# Patient Record
Sex: Male | Born: 1955 | Race: White | Hispanic: No | Marital: Single | Smoking: Former smoker
Health system: Southern US, Community
[De-identification: ages and names within clinical notes are randomized; demographics above are authoritative.]

## PROBLEM LIST (undated history)

## (undated) DIAGNOSIS — J189 Pneumonia, unspecified organism: Secondary | ICD-10-CM

## (undated) DIAGNOSIS — Z720 Tobacco use: Secondary | ICD-10-CM

## (undated) DIAGNOSIS — Z9981 Dependence on supplemental oxygen: Secondary | ICD-10-CM

## (undated) DIAGNOSIS — Q2730 Arteriovenous malformation, site unspecified: Secondary | ICD-10-CM

## (undated) DIAGNOSIS — K469 Unspecified abdominal hernia without obstruction or gangrene: Secondary | ICD-10-CM

## (undated) DIAGNOSIS — I5032 Chronic diastolic (congestive) heart failure: Secondary | ICD-10-CM

## (undated) DIAGNOSIS — R911 Solitary pulmonary nodule: Secondary | ICD-10-CM

## (undated) DIAGNOSIS — Z973 Presence of spectacles and contact lenses: Secondary | ICD-10-CM

## (undated) DIAGNOSIS — Z9289 Personal history of other medical treatment: Secondary | ICD-10-CM

## (undated) DIAGNOSIS — D126 Benign neoplasm of colon, unspecified: Secondary | ICD-10-CM

## (undated) DIAGNOSIS — I639 Cerebral infarction, unspecified: Secondary | ICD-10-CM

## (undated) DIAGNOSIS — I219 Acute myocardial infarction, unspecified: Secondary | ICD-10-CM

## (undated) DIAGNOSIS — E785 Hyperlipidemia, unspecified: Secondary | ICD-10-CM

## (undated) DIAGNOSIS — IMO0002 Reserved for concepts with insufficient information to code with codable children: Secondary | ICD-10-CM

## (undated) DIAGNOSIS — F419 Anxiety disorder, unspecified: Secondary | ICD-10-CM

## (undated) DIAGNOSIS — I739 Peripheral vascular disease, unspecified: Secondary | ICD-10-CM

## (undated) DIAGNOSIS — D649 Anemia, unspecified: Secondary | ICD-10-CM

## (undated) DIAGNOSIS — M545 Other chronic pain: Secondary | ICD-10-CM

## (undated) DIAGNOSIS — L299 Pruritus, unspecified: Secondary | ICD-10-CM

## (undated) DIAGNOSIS — E669 Obesity, unspecified: Secondary | ICD-10-CM

## (undated) DIAGNOSIS — I480 Paroxysmal atrial fibrillation: Secondary | ICD-10-CM

## (undated) DIAGNOSIS — I779 Disorder of arteries and arterioles, unspecified: Secondary | ICD-10-CM

## (undated) DIAGNOSIS — K219 Gastro-esophageal reflux disease without esophagitis: Secondary | ICD-10-CM

## (undated) DIAGNOSIS — I10 Essential (primary) hypertension: Secondary | ICD-10-CM

## (undated) DIAGNOSIS — F79 Unspecified intellectual disabilities: Secondary | ICD-10-CM

## (undated) DIAGNOSIS — J449 Chronic obstructive pulmonary disease, unspecified: Secondary | ICD-10-CM

## (undated) DIAGNOSIS — G8929 Other chronic pain: Secondary | ICD-10-CM

## (undated) HISTORY — DX: Obesity, unspecified: E66.9

## (undated) HISTORY — DX: Reserved for concepts with insufficient information to code with codable children: IMO0002

## (undated) HISTORY — DX: Peripheral vascular disease, unspecified: I73.9

## (undated) HISTORY — DX: Benign neoplasm of colon, unspecified: D12.6

## (undated) HISTORY — DX: Disorder of arteries and arterioles, unspecified: I77.9

## (undated) HISTORY — PX: MULTIPLE TOOTH EXTRACTIONS: SHX2053

## (undated) HISTORY — DX: Unspecified abdominal hernia without obstruction or gangrene: K46.9

## (undated) HISTORY — DX: Solitary pulmonary nodule: R91.1

## (undated) HISTORY — DX: Personal history of other medical treatment: Z92.89

## (undated) HISTORY — PX: CATARACT EXTRACTION, BILATERAL: SHX1313

## (undated) HISTORY — DX: Cerebral infarction, unspecified: I63.9

## (undated) HISTORY — DX: Arteriovenous malformation, site unspecified: Q27.30

## (undated) HISTORY — DX: Unspecified intellectual disabilities: F79

## (undated) HISTORY — DX: Anemia, unspecified: D64.9

## (undated) HISTORY — DX: Other chronic pain: G89.29

## (undated) HISTORY — DX: Paroxysmal atrial fibrillation: I48.0

## (undated) HISTORY — DX: Anxiety disorder, unspecified: F41.9

## (undated) HISTORY — DX: Chronic obstructive pulmonary disease, unspecified: J44.9

## (undated) HISTORY — DX: Low back pain: M54.5

## (undated) HISTORY — DX: Tobacco use: Z72.0

## (undated) HISTORY — DX: Other chronic pain: M54.50

## (undated) HISTORY — DX: Hyperlipidemia, unspecified: E78.5

## (undated) HISTORY — DX: Acute myocardial infarction, unspecified: I21.9

## (undated) HISTORY — PX: OTHER SURGICAL HISTORY: SHX169

## (undated) HISTORY — DX: Chronic diastolic (congestive) heart failure: I50.32

## (undated) SURGERY — IMAGING PROCEDURE, GI TRACT, INTRALUMINAL, VIA CAPSULE
Anesthesia: LOCAL

---

## 2001-08-17 ENCOUNTER — Emergency Department (HOSPITAL_COMMUNITY): Admission: EM | Admit: 2001-08-17 | Discharge: 2001-08-17 | Payer: Self-pay | Admitting: Emergency Medicine

## 2001-08-17 ENCOUNTER — Encounter: Payer: Self-pay | Admitting: Emergency Medicine

## 2002-09-30 ENCOUNTER — Encounter: Admission: RE | Admit: 2002-09-30 | Discharge: 2002-09-30 | Payer: Self-pay | Admitting: Family Medicine

## 2002-09-30 ENCOUNTER — Ambulatory Visit (HOSPITAL_COMMUNITY): Admission: RE | Admit: 2002-09-30 | Discharge: 2002-09-30 | Payer: Self-pay | Admitting: Family Medicine

## 2003-10-02 ENCOUNTER — Emergency Department (HOSPITAL_COMMUNITY): Admission: EM | Admit: 2003-10-02 | Discharge: 2003-10-03 | Payer: Self-pay | Admitting: Emergency Medicine

## 2003-10-09 ENCOUNTER — Inpatient Hospital Stay (HOSPITAL_COMMUNITY): Admission: EM | Admit: 2003-10-09 | Discharge: 2003-10-20 | Payer: Self-pay | Admitting: Emergency Medicine

## 2003-10-10 ENCOUNTER — Encounter (INDEPENDENT_AMBULATORY_CARE_PROVIDER_SITE_OTHER): Payer: Self-pay | Admitting: Cardiology

## 2003-10-22 ENCOUNTER — Emergency Department (HOSPITAL_COMMUNITY): Admission: EM | Admit: 2003-10-22 | Discharge: 2003-10-22 | Payer: Self-pay | Admitting: Emergency Medicine

## 2003-11-06 ENCOUNTER — Encounter: Admission: RE | Admit: 2003-11-06 | Discharge: 2004-02-04 | Payer: Self-pay | Admitting: Internal Medicine

## 2003-11-07 ENCOUNTER — Encounter: Admission: RE | Admit: 2003-11-07 | Discharge: 2003-11-07 | Payer: Self-pay | Admitting: Family Medicine

## 2003-12-08 ENCOUNTER — Encounter: Admission: RE | Admit: 2003-12-08 | Discharge: 2003-12-08 | Payer: Self-pay | Admitting: Family Medicine

## 2004-01-05 ENCOUNTER — Encounter: Admission: RE | Admit: 2004-01-05 | Discharge: 2004-01-05 | Payer: Self-pay | Admitting: Family Medicine

## 2006-10-05 DIAGNOSIS — D509 Iron deficiency anemia, unspecified: Secondary | ICD-10-CM | POA: Insufficient documentation

## 2006-10-05 DIAGNOSIS — J449 Chronic obstructive pulmonary disease, unspecified: Secondary | ICD-10-CM

## 2006-10-05 DIAGNOSIS — F79 Unspecified intellectual disabilities: Secondary | ICD-10-CM | POA: Insufficient documentation

## 2006-10-05 DIAGNOSIS — F17200 Nicotine dependence, unspecified, uncomplicated: Secondary | ICD-10-CM

## 2006-10-05 DIAGNOSIS — E7800 Pure hypercholesterolemia, unspecified: Secondary | ICD-10-CM

## 2006-12-13 ENCOUNTER — Telehealth (INDEPENDENT_AMBULATORY_CARE_PROVIDER_SITE_OTHER): Payer: Self-pay | Admitting: *Deleted

## 2006-12-22 ENCOUNTER — Encounter: Payer: Self-pay | Admitting: Family Medicine

## 2006-12-22 ENCOUNTER — Ambulatory Visit: Payer: Self-pay | Admitting: Family Medicine

## 2006-12-22 DIAGNOSIS — E1165 Type 2 diabetes mellitus with hyperglycemia: Secondary | ICD-10-CM

## 2006-12-22 DIAGNOSIS — I10 Essential (primary) hypertension: Secondary | ICD-10-CM

## 2006-12-22 LAB — CONVERTED CEMR LAB
ALT: 14 units/L (ref 0–53)
Alkaline Phosphatase: 84 units/L (ref 39–117)
CO2: 24 meq/L (ref 19–32)
Direct LDL: 118 mg/dL — ABNORMAL HIGH
MCV: 76.9 fL
Platelets: 269 10*3/uL
Potassium: 4.7 meq/L (ref 3.5–5.3)
Sodium: 135 meq/L (ref 135–145)
Total Bilirubin: 0.3 mg/dL (ref 0.3–1.2)
Total Protein: 6.9 g/dL (ref 6.0–8.3)

## 2007-01-17 ENCOUNTER — Ambulatory Visit: Payer: Self-pay | Admitting: Sports Medicine

## 2007-01-23 ENCOUNTER — Telehealth: Payer: Self-pay | Admitting: Family Medicine

## 2007-02-21 ENCOUNTER — Encounter (INDEPENDENT_AMBULATORY_CARE_PROVIDER_SITE_OTHER): Payer: Self-pay | Admitting: *Deleted

## 2007-03-14 ENCOUNTER — Telehealth: Payer: Self-pay | Admitting: *Deleted

## 2007-06-22 ENCOUNTER — Ambulatory Visit: Payer: Self-pay | Admitting: Family Medicine

## 2007-06-22 ENCOUNTER — Telehealth (INDEPENDENT_AMBULATORY_CARE_PROVIDER_SITE_OTHER): Payer: Self-pay | Admitting: *Deleted

## 2007-06-22 ENCOUNTER — Encounter: Payer: Self-pay | Admitting: Family Medicine

## 2007-06-22 DIAGNOSIS — M545 Low back pain: Secondary | ICD-10-CM

## 2007-06-22 DIAGNOSIS — F411 Generalized anxiety disorder: Secondary | ICD-10-CM | POA: Insufficient documentation

## 2007-06-22 LAB — CONVERTED CEMR LAB
BUN: 10 mg/dL (ref 6–23)
Chloride: 97 meq/L (ref 96–112)
Glucose, Bld: 354 mg/dL — ABNORMAL HIGH (ref 70–99)
LDL Cholesterol: 143 mg/dL — ABNORMAL HIGH (ref 0–99)
Potassium: 4.5 meq/L (ref 3.5–5.3)
VLDL: 47 mg/dL — ABNORMAL HIGH (ref 0–40)

## 2007-06-26 ENCOUNTER — Encounter: Payer: Self-pay | Admitting: Family Medicine

## 2007-06-29 ENCOUNTER — Encounter: Payer: Self-pay | Admitting: *Deleted

## 2007-07-01 ENCOUNTER — Emergency Department (HOSPITAL_COMMUNITY): Admission: EM | Admit: 2007-07-01 | Discharge: 2007-07-01 | Payer: Self-pay | Admitting: Family Medicine

## 2007-07-02 ENCOUNTER — Encounter: Payer: Self-pay | Admitting: Family Medicine

## 2007-07-04 ENCOUNTER — Emergency Department (HOSPITAL_COMMUNITY): Admission: EM | Admit: 2007-07-04 | Discharge: 2007-07-04 | Payer: Self-pay | Admitting: Emergency Medicine

## 2007-07-04 ENCOUNTER — Telehealth (INDEPENDENT_AMBULATORY_CARE_PROVIDER_SITE_OTHER): Payer: Self-pay | Admitting: *Deleted

## 2007-07-07 ENCOUNTER — Telehealth (INDEPENDENT_AMBULATORY_CARE_PROVIDER_SITE_OTHER): Payer: Self-pay | Admitting: Family Medicine

## 2007-07-13 ENCOUNTER — Telehealth: Payer: Self-pay | Admitting: Family Medicine

## 2007-07-30 ENCOUNTER — Ambulatory Visit: Payer: Self-pay | Admitting: Sports Medicine

## 2007-08-15 ENCOUNTER — Encounter: Payer: Self-pay | Admitting: Family Medicine

## 2007-08-16 ENCOUNTER — Ambulatory Visit: Payer: Self-pay | Admitting: Family Medicine

## 2007-08-16 ENCOUNTER — Observation Stay (HOSPITAL_COMMUNITY): Admission: EM | Admit: 2007-08-16 | Discharge: 2007-08-16 | Payer: Self-pay | Admitting: Emergency Medicine

## 2007-08-31 ENCOUNTER — Encounter: Payer: Self-pay | Admitting: *Deleted

## 2007-09-04 ENCOUNTER — Telehealth: Payer: Self-pay | Admitting: Family Medicine

## 2008-01-18 ENCOUNTER — Telehealth: Payer: Self-pay | Admitting: *Deleted

## 2008-01-18 ENCOUNTER — Ambulatory Visit: Payer: Self-pay | Admitting: Family Medicine

## 2008-01-18 LAB — CONVERTED CEMR LAB: Blood Glucose, Fingerstick: >400

## 2008-03-07 ENCOUNTER — Ambulatory Visit: Payer: Self-pay | Admitting: Family Medicine

## 2008-03-07 LAB — CONVERTED CEMR LAB: Hgb A1c MFr Bld: 10.5 %

## 2008-04-07 ENCOUNTER — Ambulatory Visit: Payer: Self-pay | Admitting: Family Medicine

## 2008-04-07 ENCOUNTER — Encounter: Payer: Self-pay | Admitting: Family Medicine

## 2008-04-15 LAB — CONVERTED CEMR LAB
AST: 19 units/L (ref 0–37)
Alkaline Phosphatase: 81 units/L (ref 39–117)
BUN: 14 mg/dL (ref 6–23)
Creatinine, Ser: 0.81 mg/dL (ref 0.40–1.50)

## 2009-04-10 ENCOUNTER — Encounter: Payer: Self-pay | Admitting: Family Medicine

## 2009-04-10 ENCOUNTER — Ambulatory Visit: Payer: Self-pay | Admitting: Family Medicine

## 2009-04-10 LAB — CONVERTED CEMR LAB
Albumin: 4 g/dL (ref 3.5–5.2)
Alkaline Phosphatase: 87 units/L (ref 39–117)
CO2: 22 meq/L (ref 19–32)
Glucose, Bld: 413 mg/dL — ABNORMAL HIGH (ref 70–99)
Potassium: 4.3 meq/L (ref 3.5–5.3)
Sodium: 136 meq/L (ref 135–145)
Total Protein: 7.4 g/dL (ref 6.0–8.3)

## 2009-04-17 ENCOUNTER — Ambulatory Visit: Payer: Self-pay | Admitting: Family Medicine

## 2009-04-17 ENCOUNTER — Encounter: Payer: Self-pay | Admitting: Family Medicine

## 2009-04-17 LAB — CONVERTED CEMR LAB
Cholesterol: 162 mg/dL (ref 0–200)
LDL Cholesterol: 100 mg/dL — ABNORMAL HIGH (ref 0–99)
Triglycerides: 180 mg/dL — ABNORMAL HIGH (ref <150)

## 2009-04-27 ENCOUNTER — Ambulatory Visit: Payer: Self-pay | Admitting: Family Medicine

## 2009-06-08 DIAGNOSIS — I480 Paroxysmal atrial fibrillation: Secondary | ICD-10-CM

## 2009-06-08 HISTORY — DX: Paroxysmal atrial fibrillation: I48.0

## 2009-06-23 ENCOUNTER — Encounter: Payer: Self-pay | Admitting: Family Medicine

## 2009-06-29 ENCOUNTER — Encounter: Payer: Self-pay | Admitting: Family Medicine

## 2009-06-29 ENCOUNTER — Inpatient Hospital Stay (HOSPITAL_COMMUNITY): Admission: EM | Admit: 2009-06-29 | Discharge: 2009-07-01 | Payer: Self-pay | Admitting: Emergency Medicine

## 2009-06-29 ENCOUNTER — Ambulatory Visit: Payer: Self-pay | Admitting: Family Medicine

## 2009-06-30 ENCOUNTER — Encounter: Payer: Self-pay | Admitting: Family Medicine

## 2009-07-09 ENCOUNTER — Encounter: Payer: Self-pay | Admitting: Family Medicine

## 2009-07-13 ENCOUNTER — Encounter: Payer: Self-pay | Admitting: Family Medicine

## 2009-07-23 ENCOUNTER — Ambulatory Visit: Payer: Self-pay | Admitting: Vascular Surgery

## 2009-07-23 ENCOUNTER — Encounter: Payer: Self-pay | Admitting: Family Medicine

## 2009-07-23 ENCOUNTER — Emergency Department (HOSPITAL_COMMUNITY): Admission: EM | Admit: 2009-07-23 | Discharge: 2009-07-23 | Payer: Self-pay | Admitting: Emergency Medicine

## 2009-07-23 ENCOUNTER — Encounter (INDEPENDENT_AMBULATORY_CARE_PROVIDER_SITE_OTHER): Payer: Self-pay | Admitting: Emergency Medicine

## 2009-07-24 ENCOUNTER — Encounter: Payer: Self-pay | Admitting: Family Medicine

## 2009-07-24 ENCOUNTER — Encounter: Payer: Self-pay | Admitting: *Deleted

## 2009-07-24 ENCOUNTER — Ambulatory Visit: Payer: Self-pay | Admitting: Family Medicine

## 2009-07-24 DIAGNOSIS — I480 Paroxysmal atrial fibrillation: Secondary | ICD-10-CM | POA: Insufficient documentation

## 2009-07-24 LAB — CONVERTED CEMR LAB
Albumin: 4 g/dL (ref 3.5–5.2)
CO2: 28 meq/L (ref 19–32)
Calcium: 9.6 mg/dL (ref 8.4–10.5)
Chloride: 99 meq/L (ref 96–112)
Glucose, Bld: 191 mg/dL — ABNORMAL HIGH (ref 70–99)
MCV: 80.5 fL (ref 78.0–100.0)
PSA: 0.18 ng/mL (ref 0.10–4.00)
Potassium: 4 meq/L (ref 3.5–5.3)
RBC: 4.93 M/uL (ref 4.22–5.81)
Sodium: 138 meq/L (ref 135–145)
Total Bilirubin: 0.3 mg/dL (ref 0.3–1.2)
Total Protein: 7.4 g/dL (ref 6.0–8.3)
WBC: 8.4 10*3/uL (ref 4.0–10.5)

## 2009-07-27 ENCOUNTER — Telehealth: Payer: Self-pay | Admitting: Family Medicine

## 2009-08-13 ENCOUNTER — Telehealth: Payer: Self-pay | Admitting: Family Medicine

## 2009-08-27 ENCOUNTER — Ambulatory Visit: Payer: Self-pay | Admitting: Family Medicine

## 2009-08-27 ENCOUNTER — Ambulatory Visit (HOSPITAL_COMMUNITY): Admission: RE | Admit: 2009-08-27 | Discharge: 2009-08-27 | Payer: Self-pay | Admitting: Family Medicine

## 2009-10-13 ENCOUNTER — Encounter: Payer: Self-pay | Admitting: Family Medicine

## 2009-10-14 ENCOUNTER — Encounter: Payer: Self-pay | Admitting: *Deleted

## 2009-10-22 ENCOUNTER — Encounter: Payer: Self-pay | Admitting: Family Medicine

## 2009-11-05 ENCOUNTER — Telehealth: Payer: Self-pay | Admitting: Family Medicine

## 2010-02-18 ENCOUNTER — Inpatient Hospital Stay: Payer: Self-pay | Admitting: Internal Medicine

## 2010-06-09 ENCOUNTER — Ambulatory Visit: Payer: Self-pay | Admitting: Family Medicine

## 2010-06-09 LAB — CONVERTED CEMR LAB: Hgb A1c MFr Bld: >14 %

## 2010-06-14 ENCOUNTER — Ambulatory Visit: Payer: Self-pay | Admitting: Family Medicine

## 2010-06-14 ENCOUNTER — Encounter: Payer: Self-pay | Admitting: Family Medicine

## 2010-06-14 LAB — CONVERTED CEMR LAB
ALT: 19 units/L (ref 0–53)
AST: 20 units/L (ref 0–37)
Alkaline Phosphatase: 75 units/L (ref 39–117)
Creatinine, Ser: 0.69 mg/dL (ref 0.40–1.50)
HCT: 43.4 % (ref 39.0–52.0)
Hemoglobin: 14.5 g/dL (ref 13.0–17.0)
LDL Cholesterol: 127 mg/dL — ABNORMAL HIGH (ref 0–99)
MCV: 78.2 fL (ref 78.0–100.0)
Platelets: 271 10*3/uL (ref 150–400)
RDW: 14.4 % (ref 11.5–15.5)
Total Bilirubin: 0.3 mg/dL (ref 0.3–1.2)

## 2010-06-18 ENCOUNTER — Encounter: Payer: Self-pay | Admitting: Family Medicine

## 2010-07-12 ENCOUNTER — Ambulatory Visit: Payer: Self-pay | Admitting: Family Medicine

## 2010-08-13 ENCOUNTER — Encounter: Payer: Self-pay | Admitting: *Deleted

## 2010-08-29 ENCOUNTER — Encounter: Payer: Self-pay | Admitting: Family Medicine

## 2010-09-01 ENCOUNTER — Ambulatory Visit: Admit: 2010-09-01 | Payer: Self-pay

## 2010-09-08 ENCOUNTER — Encounter: Payer: Self-pay | Admitting: Family Medicine

## 2010-09-08 ENCOUNTER — Emergency Department (HOSPITAL_COMMUNITY): Payer: No Typology Code available for payment source

## 2010-09-08 ENCOUNTER — Inpatient Hospital Stay (HOSPITAL_COMMUNITY)
Admission: EM | Admit: 2010-09-08 | Discharge: 2010-09-23 | DRG: 871 | Disposition: A | Discharge status: Skilled Nursing Facility | Payer: No Typology Code available for payment source | Attending: Family Medicine | Admitting: Family Medicine

## 2010-09-08 ENCOUNTER — Encounter (HOSPITAL_COMMUNITY): Payer: Self-pay | Admitting: Radiology

## 2010-09-08 DIAGNOSIS — E871 Hypo-osmolality and hyponatremia: Secondary | ICD-10-CM | POA: Diagnosis not present

## 2010-09-08 DIAGNOSIS — F341 Dysthymic disorder: Secondary | ICD-10-CM | POA: Diagnosis present

## 2010-09-08 DIAGNOSIS — G929 Unspecified toxic encephalopathy: Secondary | ICD-10-CM | POA: Diagnosis present

## 2010-09-08 DIAGNOSIS — J4489 Other specified chronic obstructive pulmonary disease: Secondary | ICD-10-CM | POA: Diagnosis present

## 2010-09-08 DIAGNOSIS — Z880 Allergy status to penicillin: Secondary | ICD-10-CM

## 2010-09-08 DIAGNOSIS — F1410 Cocaine abuse, uncomplicated: Secondary | ICD-10-CM | POA: Diagnosis present

## 2010-09-08 DIAGNOSIS — G009 Bacterial meningitis, unspecified: Secondary | ICD-10-CM | POA: Diagnosis present

## 2010-09-08 DIAGNOSIS — I10 Essential (primary) hypertension: Secondary | ICD-10-CM

## 2010-09-08 DIAGNOSIS — I6340 Cerebral infarction due to embolism of unspecified cerebral artery: Secondary | ICD-10-CM | POA: Diagnosis not present

## 2010-09-08 DIAGNOSIS — J9600 Acute respiratory failure, unspecified whether with hypoxia or hypercapnia: Secondary | ICD-10-CM | POA: Diagnosis present

## 2010-09-08 DIAGNOSIS — G92 Toxic encephalopathy: Secondary | ICD-10-CM | POA: Diagnosis present

## 2010-09-08 DIAGNOSIS — J449 Chronic obstructive pulmonary disease, unspecified: Secondary | ICD-10-CM | POA: Diagnosis present

## 2010-09-08 DIAGNOSIS — E876 Hypokalemia: Secondary | ICD-10-CM | POA: Diagnosis not present

## 2010-09-08 DIAGNOSIS — G039 Meningitis, unspecified: Secondary | ICD-10-CM

## 2010-09-08 DIAGNOSIS — Z7982 Long term (current) use of aspirin: Secondary | ICD-10-CM

## 2010-09-08 DIAGNOSIS — Z79899 Other long term (current) drug therapy: Secondary | ICD-10-CM

## 2010-09-08 DIAGNOSIS — Z6838 Body mass index (BMI) 38.0-38.9, adult: Secondary | ICD-10-CM

## 2010-09-08 DIAGNOSIS — F79 Unspecified intellectual disabilities: Secondary | ICD-10-CM | POA: Diagnosis present

## 2010-09-08 DIAGNOSIS — I4891 Unspecified atrial fibrillation: Secondary | ICD-10-CM | POA: Diagnosis present

## 2010-09-08 DIAGNOSIS — K439 Ventral hernia without obstruction or gangrene: Secondary | ICD-10-CM | POA: Diagnosis present

## 2010-09-08 DIAGNOSIS — E785 Hyperlipidemia, unspecified: Secondary | ICD-10-CM | POA: Diagnosis present

## 2010-09-08 DIAGNOSIS — D649 Anemia, unspecified: Secondary | ICD-10-CM

## 2010-09-08 DIAGNOSIS — L98499 Non-pressure chronic ulcer of skin of other sites with unspecified severity: Secondary | ICD-10-CM | POA: Diagnosis present

## 2010-09-08 DIAGNOSIS — Z794 Long term (current) use of insulin: Secondary | ICD-10-CM

## 2010-09-08 DIAGNOSIS — J320 Chronic maxillary sinusitis: Secondary | ICD-10-CM | POA: Diagnosis present

## 2010-09-08 DIAGNOSIS — E119 Type 2 diabetes mellitus without complications: Secondary | ICD-10-CM

## 2010-09-08 DIAGNOSIS — E669 Obesity, unspecified: Secondary | ICD-10-CM | POA: Diagnosis present

## 2010-09-08 DIAGNOSIS — I639 Cerebral infarction, unspecified: Secondary | ICD-10-CM

## 2010-09-08 DIAGNOSIS — D638 Anemia in other chronic diseases classified elsewhere: Secondary | ICD-10-CM | POA: Diagnosis present

## 2010-09-08 DIAGNOSIS — E875 Hyperkalemia: Secondary | ICD-10-CM | POA: Diagnosis not present

## 2010-09-08 DIAGNOSIS — R6521 Severe sepsis with septic shock: Secondary | ICD-10-CM | POA: Diagnosis present

## 2010-09-08 DIAGNOSIS — F17200 Nicotine dependence, unspecified, uncomplicated: Secondary | ICD-10-CM | POA: Diagnosis present

## 2010-09-08 DIAGNOSIS — I2510 Atherosclerotic heart disease of native coronary artery without angina pectoris: Secondary | ICD-10-CM | POA: Diagnosis present

## 2010-09-08 DIAGNOSIS — A419 Sepsis, unspecified organism: Principal | ICD-10-CM | POA: Diagnosis present

## 2010-09-08 DIAGNOSIS — E46 Unspecified protein-calorie malnutrition: Secondary | ICD-10-CM | POA: Diagnosis present

## 2010-09-08 DIAGNOSIS — R6520 Severe sepsis without septic shock: Secondary | ICD-10-CM | POA: Diagnosis present

## 2010-09-08 DIAGNOSIS — I4892 Unspecified atrial flutter: Secondary | ICD-10-CM | POA: Diagnosis present

## 2010-09-08 HISTORY — PX: TRANSTHORACIC ECHOCARDIOGRAM: SHX275

## 2010-09-08 HISTORY — DX: Cerebral infarction, unspecified: I63.9

## 2010-09-08 HISTORY — DX: Essential (primary) hypertension: I10

## 2010-09-08 HISTORY — PX: TRANSESOPHAGEAL ECHOCARDIOGRAM: SHX273

## 2010-09-08 LAB — CBC
MCH: 25.3 pg — ABNORMAL LOW (ref 26.0–34.0)
MCV: 75.7 fL — ABNORMAL LOW (ref 78.0–100.0)
Platelets: 330 10*3/uL (ref 150–400)
RBC: 5.38 MIL/uL (ref 4.22–5.81)
RDW: 13.7 % (ref 11.5–15.5)

## 2010-09-08 LAB — POCT I-STAT 3, ART BLOOD GAS (G3+)
Bicarbonate: 24.8 mEq/L — ABNORMAL HIGH (ref 20.0–24.0)
TCO2: 26 mmol/L (ref 0–100)
pCO2 arterial: 35 mmHg (ref 35.0–45.0)
pH, Arterial: 7.458 — ABNORMAL HIGH (ref 7.350–7.450)

## 2010-09-08 LAB — DIFFERENTIAL
Basophils Absolute: 0 10*3/uL (ref 0.0–0.1)
Eosinophils Relative: 0 % (ref 0–5)
Lymphocytes Relative: 7 % — ABNORMAL LOW (ref 12–46)
Lymphs Abs: 1.2 10*3/uL (ref 0.7–4.0)
Neutro Abs: 16.1 10*3/uL — ABNORMAL HIGH (ref 1.7–7.7)

## 2010-09-08 LAB — COMPREHENSIVE METABOLIC PANEL
ALT: 24 U/L (ref 0–53)
BUN: 11 mg/dL (ref 6–23)
Calcium: 9.8 mg/dL (ref 8.4–10.5)
Creatinine, Ser: 0.94 mg/dL (ref 0.4–1.5)
Glucose, Bld: 396 mg/dL — ABNORMAL HIGH (ref 70–99)
Sodium: 133 mEq/L — ABNORMAL LOW (ref 135–145)
Total Protein: 7.8 g/dL (ref 6.0–8.3)

## 2010-09-08 LAB — URINALYSIS, ROUTINE W REFLEX MICROSCOPIC
Bilirubin Urine: NEGATIVE
Ketones, ur: 40 mg/dL — AB
Nitrite: NEGATIVE
pH: 6 (ref 5.0–8.0)

## 2010-09-08 LAB — POCT I-STAT, CHEM 8
Chloride: 99 mEq/L (ref 96–112)
Creatinine, Ser: 0.7 mg/dL (ref 0.4–1.5)
Glucose, Bld: 422 mg/dL — ABNORMAL HIGH (ref 70–99)
HCT: 45 % (ref 39.0–52.0)
Hemoglobin: 15.3 g/dL (ref 13.0–17.0)
Potassium: 4.6 mEq/L (ref 3.5–5.1)
Sodium: 131 mEq/L — ABNORMAL LOW (ref 135–145)

## 2010-09-08 LAB — LACTIC ACID, PLASMA: Lactic Acid, Venous: 4.9 mmol/L — ABNORMAL HIGH (ref 0.5–2.2)

## 2010-09-08 LAB — CK TOTAL AND CKMB (NOT AT ARMC): Total CK: 478 U/L — ABNORMAL HIGH (ref 7–232)

## 2010-09-08 LAB — ETHANOL: Alcohol, Ethyl (B): <5 mg/dL (ref 0–10)

## 2010-09-08 LAB — PROCALCITONIN: Procalcitonin: <0.1 ng/mL

## 2010-09-08 LAB — RAPID URINE DRUG SCREEN, HOSP PERFORMED
Benzodiazepines: POSITIVE — AB
Cocaine: NOT DETECTED
Tetrahydrocannabinol: NOT DETECTED

## 2010-09-08 LAB — APTT: aPTT: 21 seconds — ABNORMAL LOW (ref 24–37)

## 2010-09-08 LAB — URINE MICROSCOPIC-ADD ON

## 2010-09-08 LAB — SALICYLATE LEVEL: Salicylate Lvl: <4 mg/dL (ref 2.8–20.0)

## 2010-09-08 LAB — LIPASE, BLOOD: Lipase: 15 U/L (ref 11–59)

## 2010-09-08 MED ORDER — IOHEXOL 300 MG/ML  SOLN: 100.0000 mL | Freq: Once | INTRAMUSCULAR | Status: AC | PRN

## 2010-09-09 ENCOUNTER — Inpatient Hospital Stay (HOSPITAL_COMMUNITY): Payer: No Typology Code available for payment source

## 2010-09-09 DIAGNOSIS — J9600 Acute respiratory failure, unspecified whether with hypoxia or hypercapnia: Secondary | ICD-10-CM

## 2010-09-09 DIAGNOSIS — I4891 Unspecified atrial fibrillation: Secondary | ICD-10-CM

## 2010-09-09 DIAGNOSIS — G40309 Generalized idiopathic epilepsy and epileptic syndromes, not intractable, without status epilepticus: Secondary | ICD-10-CM

## 2010-09-09 DIAGNOSIS — A419 Sepsis, unspecified organism: Secondary | ICD-10-CM

## 2010-09-09 LAB — GLUCOSE, CAPILLARY
Glucose-Capillary: 109 mg/dL — ABNORMAL HIGH (ref 70–99)
Glucose-Capillary: 128 mg/dL — ABNORMAL HIGH (ref 70–99)
Glucose-Capillary: 139 mg/dL — ABNORMAL HIGH (ref 70–99)
Glucose-Capillary: 147 mg/dL — ABNORMAL HIGH (ref 70–99)
Glucose-Capillary: 149 mg/dL — ABNORMAL HIGH (ref 70–99)
Glucose-Capillary: 179 mg/dL — ABNORMAL HIGH (ref 70–99)
Glucose-Capillary: 207 mg/dL — ABNORMAL HIGH (ref 70–99)
Glucose-Capillary: 255 mg/dL — ABNORMAL HIGH (ref 70–99)
Glucose-Capillary: 297 mg/dL — ABNORMAL HIGH (ref 70–99)
Glucose-Capillary: 315 mg/dL — ABNORMAL HIGH (ref 70–99)
Glucose-Capillary: 336 mg/dL — ABNORMAL HIGH (ref 70–99)
Glucose-Capillary: 347 mg/dL — ABNORMAL HIGH (ref 70–99)
Glucose-Capillary: 385 mg/dL — ABNORMAL HIGH (ref 70–99)
Glucose-Capillary: 88 mg/dL (ref 70–99)

## 2010-09-09 LAB — CBC
Hemoglobin: 11.5 g/dL — ABNORMAL LOW (ref 13.0–17.0)
MCHC: 32.5 g/dL (ref 30.0–36.0)
Platelets: 300 10*3/uL (ref 150–400)
RBC: 4.64 MIL/uL (ref 4.22–5.81)

## 2010-09-09 LAB — CARDIAC PANEL(CRET KIN+CKTOT+MB+TROPI)
CK, MB: 6.9 ng/mL (ref 0.3–4.0)
CK, MB: 4.3 ng/mL — ABNORMAL HIGH (ref 0.3–4.0)
Total CK: 1160 U/L — ABNORMAL HIGH (ref 7–232)
Total CK: 1289 U/L — ABNORMAL HIGH (ref 7–232)
Troponin I: 0.15 ng/mL — ABNORMAL HIGH (ref 0.00–0.06)
Troponin I: 0.22 ng/mL — ABNORMAL HIGH (ref 0.00–0.06)

## 2010-09-09 LAB — BASIC METABOLIC PANEL
CO2: 22 mEq/L (ref 19–32)
Calcium: 8.5 mg/dL (ref 8.4–10.5)
Chloride: 96 mEq/L (ref 96–112)
GFR calc Af Amer: >60 mL/min (ref >60)
Sodium: 130 mEq/L — ABNORMAL LOW (ref 135–145)

## 2010-09-09 LAB — POCT I-STAT 3, ART BLOOD GAS (G3+)
Bicarbonate: 23.8 mEq/L (ref 20.0–24.0)
TCO2: 25 mmol/L (ref 0–100)
pCO2 arterial: 35.2 mmHg (ref 35.0–45.0)
pH, Arterial: 7.446 (ref 7.350–7.450)

## 2010-09-09 LAB — CSF CELL COUNT WITH DIFFERENTIAL
Eosinophils, CSF: 0 % (ref 0–1)
Eosinophils, CSF: 0 % (ref 0–1)
Other Cells, CSF: 0
Segmented Neutrophils-CSF: 95 % — ABNORMAL HIGH (ref 0–6)
Segmented Neutrophils-CSF: 96 % — ABNORMAL HIGH (ref 0–6)
WBC, CSF: 117 /mm3 (ref 0–5)
WBC, CSF: 122 /mm3 (ref 0–5)

## 2010-09-09 LAB — OSMOLALITY: Osmolality: 283 mOsm/kg (ref 275–300)

## 2010-09-09 LAB — URINE CULTURE

## 2010-09-09 LAB — PROTEIN AND GLUCOSE, CSF: Total  Protein, CSF: 94 mg/dL — ABNORMAL HIGH (ref 15–45)

## 2010-09-09 MED ORDER — IOHEXOL 300 MG/ML  SOLN
100.0000 mL | Freq: Once | INTRAMUSCULAR | Status: AC | PRN
  Administered 2010-09-09: 100 mL via INTRAVENOUS
  Filled 2010-09-09: qty 100

## 2010-09-09 NOTE — Miscellaneous (Signed)
Summary: Repeat Chest CT to f/u RUL nodule  Clinical Lists Changes  Problems: Added new problem of LUNG NODULE (ICD-518.89) Orders: Added new Test order of CT with Contrast (CT w/ contrast) - Signed

## 2010-09-09 NOTE — Letter (Signed)
Summary: Generic Letter  [facility]  [address]   [place], [place] [address]   Phone: [phone]  Fax: [phone]    10/14/2009  [facility] [address] [place], [place]  [address]  Dear Mr. Pollino,  I have scheduled your CT scan for October 22, 2009 at 9:40am. You will need to arrive 20 minutes early and please DO NOT eat anything 4 hours before the procedure, you may drink water and take your medications. It will be done at [address]. If for any reason you cannot keep this appt you will need to call [facility] at [phone]) 24 hours in advance to reschedule. If you have any questions please feel free to call our office at [phone].  Also if you would call our office and give [place] an updated phone number so we will be able to reach you by phone in ther future.    Sincerely,   Tessie Fass CMA

## 2010-09-09 NOTE — Assessment & Plan Note (Signed)
Summary: f/u DM   Vital Signs:  Patient profile:   55 year old male Height:      60.5 inches Weight:      213 pounds BMI:     41.06 Temp:     98.2 degrees F oral Pulse rate:   78 / minute BP sitting:   111 / 75  (left arm) Cuff size:   regular  Vitals Entered By: Tessie Fass CMA (July 12, 2010 10:25 AM) CC: DM Is Patient Diabetic? Yes Pain Assessment Patient in pain? yes     Location: lower back Intensity: 9   Primary Care Provider:  Cat Ta MD  CC:  DM.  History of Present Illness: 55 y/o M here to discuss DM and Diltiazem+Simvastatin combo  DIABETES Meds: Lantus 15 units in AM.  Metformin 1000mg  two times a day, Glipizide 5mg  two times a day.  Compliance: compliance has been an issue, he does not inform us when he is out of medicine.  Often runs out of money for medicine.  It has been difficult to adjust his Lantus dose since historically he has gone for months without taking his meds.  States that has been taking all meds daily.   Diet: does not comply with ADA diet.  Drinks sodas (Cokes).  Lightheadedness:no    Dizziness :no   Confusion:no    Shakiness: no   Abd  pain: no   Nausea:no     Vomiting:no      CBGs:  130s average, but 197 this morning, a couple times in 200s.  Checks every morning.  No episodes of hypoglycemia.   Afib new onset 06/2009:  Taking Diltiazem and metoprolol for rate/rhythm control. No coumadin, CHADS score 2 (DM, HTN) No new events since onset No chest pain, palpitations, syncope, dyspnea   Habits & Providers  Alcohol-Tobacco-Diet     Tobacco Status: current     Tobacco Counseling: to quit use of tobacco products     Cigarette Packs/Day: 1.0  Current Medications (verified): 1)  Onetouch Test   Strp (Glucose Blood) .... Test Blood Glucose As Directed 2)  Lisinopril 10 Mg  Tabs (Lisinopril) .Marland Kitchen.. 1 Tab By Mouth Daily For Blood Pressure 3)  Metformin Hcl 1000 Mg Tabs (Metformin Hcl) .... One Tablet Twice Daily Prior To Meals For  Diabetes 4)  Paroxetine Hcl 40 Mg  Tabs (Paroxetine Hcl) .Marland Kitchen.. 1 Tab By Mouth Daily For Nerves 5)  Onetouch Lancets   Misc (Lancets) .... Test Blood Sugar As Directed 6)  Glipizide 5 Mg  Tabs (Glipizide) .Marland Kitchen.. 1 Tab By Mouth Two Times A Day For Diabetes 7)  Klonopin 0.5 Mg  Tabs (Clonazepam) .Marland Kitchen.. 1 Tab By Mouth Two Times A Day For Anxiety 8)  Simvastatin 80 Mg Tabs (Simvastatin) .Marland Kitchen.. 1 Tab By Mouth At Bedtime For Cholesterol 9)  Aspirin Ec Low Dose 81 Mg  Tbec (Aspirin) .Marland Kitchen.. 1 Tab By Mouth Daily 10)  Neurontin 300 Mg Caps (Gabapentin) .... One Tablet By Mouth Daily For Pain 11)  Lantus Solostar 100 Unit/ml Soln (Insulin Glargine) .... Inject 15 Units Every Morning. Dispense One Month Supply. 12)  Bd Insulin Syringe Microfine 28g X 1/2" 1 Ml Misc (Insulin Syringe-Needle U-100) .... Dispense Quantity Sufficient For One Month Supply With Daily Dosing.  Dispense One Box. 13)  Triamcinolone Acetonide 0.1 % Oint (Triamcinolone Acetonide) .... Apply To Both Legs Three Times A Day For Rash and Itchiness. 14)  Diltiazem Hcl Er Beads 240 Mg Xr24h-Cap (Diltiazem Hcl Er Beads) .Marland KitchenMarland KitchenMarland Kitchen  1 Capsule By Mouth Daily For Heart Rate 15)  Metoprolol Tartrate 25 Mg Tabs (Metoprolol Tartrate) .... 1/2 Tab By Mouth Two Times A Day For Blood Pressure 16)  Bd Pen Needle Mini U/f 31g X 5 Mm Misc (Insulin Pen Needle) .... Use As Directed 17)  Nitrostat 0.4 Mg Subl (Nitroglycerin) .... Place 1 On Tongue When You Have Chest Pain. If After The 2nd One You Still Have Chest Pain, Go To Er  Allergies (verified): 1)  ! Penicillin  Past History:  Past Medical History: -Mental retardation -tobacco dependence -h/o Anemia, iron def, -Diabetes mellitus, type II -Hypertension -Anxiety -Hyperlipidemia -A fib (06/2009), [place] score 57  -Hx of lung nodule -Chronic low back pain  **History of noncompliance  Social History: Packs/Day:  1.0  Review of Systems       per hpi   Physical Exam  General:   Well-developed,well-nourished,in no acute distress; alert,appropriate and cooperative throughout examination. Vitals reviewed.   Lungs:  Normal respiratory effort, chest expands symmetrically. Lungs are clear to auscultation, no crackles or wheezes. Heart:  Normal rate and regular rhythm. S1 and S2 normal without gallop, murmur, click, rub or other extra sounds. Extremities:  No clubbing, cyanosis, edema, or deformity noted with normal full range of motion of all joints.     Impression & Recommendations:  Problem # 1:  ATRIAL FIBRILLATION (ICD-427.31) Assessment Unchanged Rate controlled.  Will continue Diltiazem and metoprolol.  Will change Simvastatin 80mg  to Lipitor 40mg  to decrease risk of myalgias.    His updated medication list for this problem includes:    Aspirin Ec Low Dose 81 Mg Tbec (Aspirin) .Marland Kitchen... 1 tab by mouth daily    Diltiazem Hcl Er Beads 240 Mg Xr24h-cap (Diltiazem hcl er beads) .Marland Kitchen... 1 capsule by mouth daily for heart rate    Metoprolol Tartrate 25 Mg Tabs (Metoprolol tartrate) .Marland Kitchen... 1/2 tab by mouth two times a day for blood pressure  Orders: [facility]- Est Level  3 ([address])  Problem # 2:  DIABETES MELLITUS, TYPE II (ICD-250.00) Assessment: Unchanged A1C > 14 on 06/09/10 after being off meds for several months.  Close monitoring of CBGs to adjust Lantus.  No change to meds today.   His updated medication list for this problem includes:    Lisinopril 10 Mg Tabs (Lisinopril) .Marland Kitchen... 1 tab by mouth daily for blood pressure    Metformin Hcl 1000 Mg Tabs (Metformin hcl) ..... One tablet twice daily prior to meals for diabetes    Glipizide 5 Mg Tabs (Glipizide) .Marland Kitchen... 1 tab by mouth two times a day for diabetes    Aspirin Ec Low Dose 81 Mg Tbec (Aspirin) .Marland Kitchen... 1 tab by mouth daily    Lantus Solostar 100 Unit/ml Soln (Insulin glargine) ..... Inject 15 units every morning. dispense one month supply.  Orders: [facility]- Est Level  3 ([address])  Complete Medication List: 1)  Onetouch Test  Strp (Glucose blood) .... Test blood glucose as directed 2)  Lisinopril 10 Mg Tabs (Lisinopril) .Marland Kitchen.. 1 tab by mouth daily for blood pressure 3)  Metformin Hcl 1000 Mg Tabs (Metformin hcl) .... One tablet twice daily prior to meals for diabetes 4)  Paroxetine Hcl 40 Mg Tabs (Paroxetine hcl) .Marland Kitchen.. 1 tab by mouth daily for nerves 5)  Onetouch Lancets Misc (Lancets) .... Test blood sugar as directed 6)  Glipizide 5 Mg Tabs (Glipizide) .Marland Kitchen.. 1 tab by mouth two times a day for diabetes 7)  Klonopin 0.5 Mg Tabs (Clonazepam) .Marland Kitchen.. 1 tab by mouth  two times a day for anxiety 8)  Lipitor 40 Mg Tabs (Atorvastatin calcium) .Marland Kitchen.. 1 tab by mouth at bedtime for cholesterol.  do not take simvastatin while taking this. 9)  Aspirin Ec Low Dose 81 Mg Tbec (Aspirin) .Marland Kitchen.. 1 tab by mouth daily 10)  Neurontin 300 Mg Caps (Gabapentin) .... One tablet by mouth daily for pain 11)  Lantus Solostar 100 Unit/ml Soln (Insulin glargine) .... Inject 15 units every morning. dispense one month supply. 12)  Bd Insulin Syringe Microfine 28g X 1/2" 1 Ml Misc (Insulin syringe-needle u-100) .... Dispense quantity sufficient for one month supply with daily dosing.  dispense one box. 13)  Triamcinolone Acetonide 0.1 % Oint (Triamcinolone acetonide) .... Apply to both legs three times a day for rash and itchiness.  dispense for both legs. 14)  Diltiazem Hcl Er Beads 240 Mg Xr24h-cap (Diltiazem hcl er beads) .Marland Kitchen.. 1 capsule by mouth daily for heart rate 15)  Metoprolol Tartrate 25 Mg Tabs (Metoprolol tartrate) .... 1/2 tab by mouth two times a day for blood pressure 16)  Bd Pen Needle Mini U/f 31g X 5 Mm Misc (Insulin pen needle) .... Use as directed 17)  Nitrostat 0.4 Mg Subl (Nitroglycerin) .... Place 1 on tongue when you have chest pain. if after the 2nd one you still have chest pain, go to er  Patient Instructions: 1)  Please schedule a follow-up appointment in 1 month for diabetes. 2)  For cholesterol: STOP taking Simvastatin.  START  taking Lipitor 40mg  at bedtime  3)  For rash: use triamcinolone   4)  Tobacco is very bad for your health and your loved ones ! You should stop smoking !  5)  Stop smoking tips: Choose a quit date. Cut down before the quit date. Decide what you will do as a substitute when you feel the urge to smoke(gum, toothpick, exercise).  Prescriptions: TRIAMCINOLONE ACETONIDE 0.1 % OINT (TRIAMCINOLONE ACETONIDE) apply to both legs three times a day for rash and itchiness.  Dispense for both legs.  #1 x 3   Entered and Authorized by:   Angeline Slim MD   Signed by:   Angeline Slim MD on 07/12/2010   Method used:   [facility]        [facility] [address] [phone]* (retail)       [address]       [place], [place]  [address]       Ph: [phone] or [phone]       Fax: [phone]   RxID:   [id number] LIPITOR 40 MG TABS (ATORVASTATIN CALCIUM) 1 tab by mouth at bedtime for cholesterol.  Do not take Simvastatin while taking this.  #30 x 6   Entered and Authorized by:   Angeline Slim MD   Signed by:   Angeline Slim MD on 07/12/2010   Method used:   [facility]        [facility] [address] [phone]* (retail)       [address]       [place], [place]  [address]       Ph: [phone] or [phone]       Fax: [phone]   RxID:   [id number]    Orders Added: 1)  [facility]- Est Level  3 [[address]]

## 2010-09-09 NOTE — Assessment & Plan Note (Signed)
Summary: FU/KH   Vital Signs:  Patient profile:   55 year old male Height:      60.5 inches Weight:      227 pounds BMI:     43.76 Temp:     98.1 degrees F oral Pulse rate:   89 / minute BP sitting:   102 / 68  (right arm) Cuff size:   regular  Vitals Entered By: Tessie Fass CMA (August 27, 2009 11:37 AM) CC: F/U Is Patient Diabetic? Yes Pain Assessment Patient in pain? yes     Location: chest Intensity: 9   Primary Care Provider:  Ailana Cuadrado MD  CC:  F/U.  History of Present Illness: 55 y/o M with   New Dx Atrial Fibrillation that required hospitalization in Dec 2010.  Pt currently feels better.  He does endorse occasional chest pain in the past 3 days that lasts less than 5 minutes.  Pt cannot remember what he was doing when these chest pain occurred.  He denies nausea or diaphoresis.  Currently without chest pain.     Habits & Providers  Alcohol-Tobacco-Diet     Tobacco Status: current     Cigarette Packs/Day: <0.25     Other Tobacco pipe  Current Medications (verified): 1)  Onetouch Test   Strp (Glucose Blood) .... Test Blood Glucose As Directed 2)  Lisinopril 10 Mg  Tabs (Lisinopril) .Marland Kitchen.. 1 Tab By Mouth Daily 3)  Metformin Hcl 1000 Mg Tabs (Metformin Hcl) .... One Tablet Twice Daily Prior To Meals 4)  Paroxetine Hcl 40 Mg  Tabs (Paroxetine Hcl) .Marland Kitchen.. 1 Tab By Mouth Daily 5)  Onetouch Lancets   Misc (Lancets) .... Test Blood Sugar As Directed 6)  Glipizide 5 Mg  Tabs (Glipizide) .Marland Kitchen.. 1 Tab By Mouth Two Times A Day 7)  Klonopin 0.5 Mg  Tabs (Clonazepam) .Marland Kitchen.. 1 Tab By Mouth Two Times A Day For Anxiety 8)  Simvastatin 80 Mg Tabs (Simvastatin) .Marland Kitchen.. 1 Tab By Mouth At Bedtime 9)  Aspirin Ec Low Dose 81 Mg  Tbec (Aspirin) .Marland Kitchen.. 1 Tab By Mouth Daily 10)  Neurontin 300 Mg Caps (Gabapentin) .... One Tablet By Mouth Daily 11)  Lantus Solostar 100 Unit/ml Soln (Insulin Glargine) .... Inject 15 Units Every Morning. Dispense One Month Supply. 12)  Bd Insulin Syringe Microfine  28g X 1/2" 1 Ml Misc (Insulin Syringe-Needle U-100) .... Dispense Quantity Sufficient For One Month Supply With Daily Dosing.  Dispense One Box. 13)  Triamcinolone Acetonide 0.1 % Oint (Triamcinolone Acetonide) .... Apply To Both Legs Three Times A Day For Rash and Itchiness. 14)  Diltiazem Hcl Er Beads 240 Mg Xr24h-Cap (Diltiazem Hcl Er Beads) .Marland Kitchen.. 1 Capsule By Mouth Daily For Heart Rate  Allergies (verified): 1)  ! Penicillin  Social History: Packs/Day:  <0.25  Physical Exam  General:  Well-developed,well-nourished,in no acute distress; alert,appropriate and cooperative throughout examination. vitals reivewed.   Head:  normocephalic and atraumatic.   Lungs:  Normal respiratory effort, chest expands symmetrically. Lungs are clear to auscultation, no crackles or wheezes. Heart:  Normal rate and regular rhythm. S1 and S2 normal without gallop, murmur, click, rub or other extra sounds. Abdomen:  soft, non-tender, normal bowel sounds, and no distention.  large abdominal hernia present.   Extremities:  No clubbing, cyanosis, edema, or deformity noted with normal full range of motion of all joints.     Impression & Recommendations:  Problem # 1:  ATRIAL FIBRILLATION (ICD-427.31) EKG NSR with rate 81.  No ST-T changes.  Will continue Diltiazem, add metoprolol for better rate control.  Since I've made changes to meds today, pt is to rtc in 2-3 wks for f/u.  His updated medication list for this problem includes:    Aspirin Ec Low Dose 81 Mg Tbec (Aspirin) .Marland Kitchen... 1 tab by mouth daily    Diltiazem Hcl Er Beads 240 Mg Xr24h-cap (Diltiazem hcl er beads) .Marland Kitchen... 1 capsule by mouth daily for heart rate    Metoprolol Tartrate 25 Mg Tabs (Metoprolol tartrate) .Marland Kitchen... 1/2 tab by mouth two times a day  Orders: [facility]- Est Level  3 ([address])  Problem # 2:  HYPERTENSION (ICD-401.9) Assessment: Unchanged BP at goal.  Since we are adding metoprolol, will decrase lisinopril to 5mg  daily.  Pt instructed to cut  current pills in half.   His updated medication list for this problem includes:    Lisinopril 10 Mg Tabs (Lisinopril) .Marland Kitchen... 1 tab by mouth daily    Diltiazem Hcl Er Beads 240 Mg Xr24h-cap (Diltiazem hcl er beads) .Marland Kitchen... 1 capsule by mouth daily for heart rate    Metoprolol Tartrate 25 Mg Tabs (Metoprolol tartrate) .Marland Kitchen... 1/2 tab by mouth two times a day  Orders: [facility]- Est Level  3 ([id number])  Complete Medication List: 1)  Onetouch Test Strp (Glucose blood) .... Test blood glucose as directed 2)  Lisinopril 10 Mg Tabs (Lisinopril) .Marland Kitchen.. 1 tab by mouth daily 3)  Metformin Hcl 1000 Mg Tabs (Metformin hcl) .... One tablet twice daily prior to meals 4)  Paroxetine Hcl 40 Mg Tabs (Paroxetine hcl) .Marland Kitchen.. 1 tab by mouth daily 5)  Onetouch Lancets Misc (Lancets) .... Test blood sugar as directed 6)  Glipizide 5 Mg Tabs (Glipizide) .Marland Kitchen.. 1 tab by mouth two times a day 7)  Klonopin 0.5 Mg Tabs (Clonazepam) .Marland Kitchen.. 1 tab by mouth two times a day for anxiety 8)  Simvastatin 80 Mg Tabs (Simvastatin) .Marland Kitchen.. 1 tab by mouth at bedtime 9)  Aspirin Ec Low Dose 81 Mg Tbec (Aspirin) .Marland Kitchen.. 1 tab by mouth daily 10)  Neurontin 300 Mg Caps (Gabapentin) .... One tablet by mouth daily 11)  Lantus Solostar 100 Unit/ml Soln (Insulin glargine) .... Inject 15 units every morning. dispense one month supply. 12)  Bd Insulin Syringe Microfine 28g X 1/2" 1 Ml Misc (Insulin syringe-needle u-100) .... Dispense quantity sufficient for one month supply with daily dosing.  dispense one box. 13)  Triamcinolone Acetonide 0.1 % Oint (Triamcinolone acetonide) .... Apply to both legs three times a day for rash and itchiness. 14)  Diltiazem Hcl Er Beads 240 Mg Xr24h-cap (Diltiazem hcl er beads) .Marland Kitchen.. 1 capsule by mouth daily for heart rate 15)  Metoprolol Tartrate 25 Mg Tabs (Metoprolol tartrate) .... 1/2 tab by mouth two times a day 16)  Bd Pen Needle Mini U/f 31g X 5 Mm Misc (Insulin pen needle) .... Use as directed 17)  Nitrostat 0.4 Mg Subl  (Nitroglycerin) .... Place 1 on tongue when you have chest pain. if after the 2nd one you still have chest pain, go to er  Patient Instructions: 1)  Please schedule a follow-up appointment in 2-3 weeks for heart rate.  2)  New dose: Lisinopril 10mg , take only 1/2 tab daily for blood pressure. 3)  New Med: Metoprolol 25mg , take 1/2 tab daily for heart rate. 4)  I've sent in a prescription for your needles for your diabetes syringe. 5)  When you have chest pain, you can use the Nitroglycerin on your tongue.  If you still have  chest pain after two tabs, then go to ER.  Prescriptions: NITROSTAT 0.4 MG SUBL (NITROGLYCERIN) place 1 on tongue when you have chest pain. If after the 2nd one you still have chest pain, go to ER  #34 x 6   Entered and Authorized by:   Angeline Slim MD   Signed by:   Angeline Slim MD on 08/27/2009   Method used:   [facility]        [facility] [address] [phone]* (retail)       [address]       [place], [place]  [address]       Ph: [phone] or [phone]       Fax: [phone]   RxID:   [id number] BD PEN NEEDLE MINI U/F 31G X 5 MM MISC (INSULIN PEN NEEDLE) use as directed  #100 x 99   Entered and Authorized by:   Angeline Slim MD   Signed by:   Angeline Slim MD on 08/27/2009   Method used:   [facility]        [facility] [address] [phone]* (retail)       [address]       [place], [place]  [address]       Ph: [phone] or [phone]       Fax: [phone]   RxID:   [id number] METOPROLOL TARTRATE 25 MG TABS (METOPROLOL TARTRATE) 1/2 tab by mouth two times a day  #34 x 5   Entered and Authorized by:   Angeline Slim MD   Signed by:   Angeline Slim MD on 08/27/2009   Method used:   [facility] to        [facility] [address] [phone]* (retail)       [address]       [place], [place]  [address]       Ph: [phone] or [phone]       Fax: [phone]   RxID:   [id number]

## 2010-09-09 NOTE — Progress Notes (Signed)
Summary: Pt DNKA colonoscopy appt  ---- Converted from flag ---- ---- 11/05/2009 5:38 AM, Debria Broecker MD wrote: Per 07/24/09 referral, pt had appt with Dr Elnoria Howard 08/10/09 for colonscopy. Was it done? We do not have report.  Please call. CT ------------------------------  pt did not keep appt.

## 2010-09-09 NOTE — Assessment & Plan Note (Signed)
Summary: needs rx/mj   Vital Signs:  Patient profile:   55 year old male Weight:      200.8 pounds BMI:     38.71 Temp:     97.8 degrees F Pulse rate:   74 / minute BP sitting:   156 / 72  (right arm)  Vitals Entered By: Starleen Blue RN (June 09, 2010 2:01 PM) CC: med refills  Is Patient Diabetic? Yes Pain Assessment Patient in pain? no        Primary Care Ermin Parisien:  Cat Ta MD  CC:  med refills .  History of Present Illness: 55 y/o M with multiple medical issues including DM, HTN, HLD, Afib, Tobacco abuse who has not been to see me since Jan 2011.  He has been out of meds for at least 3 months.  He is brought to appt by his sister.  His sister states that he moved to [facility], but it did not work out so he is back in [place].  He lost his apt, is now living in a motel.  His sister works in housekeeping at [employer], so it is easier for her to assist him.    Have been out of meds for at least 3 months.    DIABETES Meds: metformin 1000mg  two times a day, glipizide 5mg  two times a day, lantus 15 in AM Compliance : no Diet:L no Lightheadedness :no   Dizziness:no    Confusion:no    Shakiness :no  Abd  pain :no  Nausea :no   Vomiting:no       HYPERTENSION Disease Monitoring: does not check  Blood pressure range:  Medications: lisinopril 10, metoprolol 12.5 two times a day  Compliance: no  Lightheadedness:no   Edema:no   Chest pain: no  Dyspnea: no Prevention Exercise: no  Salt restriction:no Tobacco: Yes   HYPERLIPIDEMIA: Med: Simvastatin 80mg   Compliance:no Prob taking med: he does not come to visits Muscle aches:no Abd  pain:no   AFIB: Medications: Diltiazem XR 240mg  daily Coumadin: no  Feelings of palpiations: no  Syncope: no   Chest pain: no   Lightheadedness: no   Habits & Providers  Alcohol-Tobacco-Diet     Tobacco Status: current     Cigarette Packs/Day: 0.5  Current Medications (verified): 1)  Onetouch Test   Strp (Glucose Blood) ....  Test Blood Glucose As Directed 2)  Lisinopril 10 Mg  Tabs (Lisinopril) .Marland Kitchen.. 1 Tab By Mouth Daily For Blood Pressure 3)  Metformin Hcl 1000 Mg Tabs (Metformin Hcl) .... One Tablet Twice Daily Prior To Meals For Diabetes 4)  Paroxetine Hcl 40 Mg  Tabs (Paroxetine Hcl) .Marland Kitchen.. 1 Tab By Mouth Daily For Nerves 5)  Onetouch Lancets   Misc (Lancets) .... Test Blood Sugar As Directed 6)  Glipizide 5 Mg  Tabs (Glipizide) .Marland Kitchen.. 1 Tab By Mouth Two Times A Day For Diabetes 7)  Klonopin 0.5 Mg  Tabs (Clonazepam) .Marland Kitchen.. 1 Tab By Mouth Two Times A Day For Anxiety 8)  Simvastatin 80 Mg Tabs (Simvastatin) .Marland Kitchen.. 1 Tab By Mouth At Bedtime For Cholesterol 9)  Aspirin Ec Low Dose 81 Mg  Tbec (Aspirin) .Marland Kitchen.. 1 Tab By Mouth Daily 10)  Neurontin 300 Mg Caps (Gabapentin) .... One Tablet By Mouth Daily For Pain 11)  Lantus Solostar 100 Unit/ml Soln (Insulin Glargine) .... Inject 15 Units Every Morning. Dispense One Month Supply. 12)  Bd Insulin Syringe Microfine 28g X 1/2" 1 Ml Misc (Insulin Syringe-Needle U-100) .... Dispense Quantity Sufficient For  One Month Supply With Daily Dosing.  Dispense One Box. 13)  Triamcinolone Acetonide 0.1 % Oint (Triamcinolone Acetonide) .... Apply To Both Legs Three Times A Day For Rash and Itchiness. 14)  Diltiazem Hcl Er Beads 240 Mg Xr24h-Cap (Diltiazem Hcl Er Beads) .Marland Kitchen.. 1 Capsule By Mouth Daily For Heart Rate 15)  Metoprolol Tartrate 25 Mg Tabs (Metoprolol Tartrate) .... 1/2 Tab By Mouth Two Times A Day For Blood Pressure 16)  Bd Pen Needle Mini U/f 31g X 5 Mm Misc (Insulin Pen Needle) .... Use As Directed 17)  Nitrostat 0.4 Mg Subl (Nitroglycerin) .... Place 1 On Tongue When You Have Chest Pain. If After The 2nd One You Still Have Chest Pain, Go To Er  Allergies (verified): 1)  ! Penicillin  Past History:  Past Medical History: Last updated: 07/24/2009 MR tobacco dependence h/o Anemia, iron def, Diabetes mellitus, type II Hypertension Anxiety Hyperlipidemia A fib  (06/2009)  Past Surgical History: Last updated: 07/24/2009 -CTA 06/30/2009: No Pulmonary Emboli.  2 mm posterior right upper lobe nodule.  This is nonspecific and could reflect nodular scarring as there was previous airspace disease in this area.  This can be followed with repeat CT in 3-6  months. -Echo 06/30/2009: Systolic fxn normal.  EF 55-65%.  Doppler parameters are consistent with abnormal left ventricular relaxation (Grade 1 diastolic dysfunction).  Trivial Aortic regurg.  Mitral valve with calcified annulus.   Read by Sheliah Mends, MD, PhD -Echo - EF 55-65%, nl LV fxn - 10/07/2003 -CT chest - patchy diffuse lung opacities, no lymphadenopathy - 10/07/2003,  -Ventral hernia repair x 2 - 08/09/1995  Family History: Last updated: 10/05/2006 3 sisters, 1 brother who have stomach problems & HTN, but they don`t f/u w/ MD often., 4 half brothers and sisters., Dad died age 68 from heart problems., Mom died age 12 from an MI.  Social History: Last updated: 06/09/2010 Lives in motel ([place], room [phone]), single, no kids.   Released from prison in 08/2001 after serving 2 yrs for indecency w/ a minor.  Smokes cigarettes >30 years.  Also smokes pipe.  Denies ETOH/Drugs - states they do nothing for him.  Risk Factors: Smoking Status: current (06/09/2010) Packs/Day: 0.5 (06/09/2010) Cans of tobacco/wk: yes (12/22/2006)  Social History: Lives in motel ([place], room [address]), single, no kids.   Released from prison in 08/2001 after serving 2 yrs for indecency w/ a minor.  Smokes cigarettes >30 years.  Also smokes pipe.  Denies ETOH/Drugs - states they do nothing for him.Packs/Day:  0.5  Review of Systems       per hpi   Physical Exam  General:  Well-developed,well-nourished,in no acute distress; alert,appropriate and cooperative throughout examination. vitals reviewed.  Mouth:  Oral mucosa and oropharynx without lesions or exudates.   Neck:  No deformities, masses, or tenderness  noted. Lungs:  Normal respiratory effort, chest expands symmetrically. Lungs are clear to auscultation, no crackles or wheezes. Heart:  Normal rate and regular rhythm. S1 and S2 normal without gallop, murmur, click, rub or other extra sounds.  no bruit.  Abdomen:  Bowel sounds positive,abdomen soft and non-tender without masses, organomegaly or hernias noted.  abdominal obesity.   Pulses:  R radial normal, R dorsalis pedis normal, L radial normal, and L dorsalis pedis normal.   Extremities:  trace left pedal edema and trace right pedal edema.   Neurologic:  alert & oriented X3.   Cervical Nodes:  No lymphadenopathy noted   Impression & Recommendations:  Problem # 1:  DIABETES MELLITUS, TYPE II (ICD-250.00) Assessment Deteriorated A1C 14 deteriorated again due to noncompliance.  It has been difficult to dose pt's regimen of meds since he has had periods when he does not come in to see me, or when he has been out of meds for months.  Will resume all previous meds.  Pt to rtc in 3-4 wks for monitoring.  I have asked him to check CBGs so that I can make adjustments to his Lantus as appropriate.    His updated medication list for this problem includes:    Lisinopril 10 Mg Tabs (Lisinopril) .Marland Kitchen... 1 tab by mouth daily for blood pressure    Metformin Hcl 1000 Mg Tabs (Metformin hcl) ..... One tablet twice daily prior to meals for diabetes    Glipizide 5 Mg Tabs (Glipizide) .Marland Kitchen... 1 tab by mouth two times a day for diabetes    Aspirin Ec Low Dose 81 Mg Tbec (Aspirin) .Marland Kitchen... 1 tab by mouth daily    Lantus Solostar 100 Unit/ml Soln (Insulin glargine) ..... Inject 15 units every morning. dispense one month supply.  Orders: A1C-[facility] ([address]) [facility]- Est  Level 4 (99214)Future Orders: Comp Met-[facility] ([id number]) ... 06/17/2011 CBC-[facility] (91478) ... 06/16/2011  Problem # 2:  HYPERTENSION (ICD-401.9) Assessment: Deteriorated Deterioration of BP likely due to noncompliance (out of meds for 3 months).  Pt's  previous BPs were better controlled.  Will resume previous meds. Pt to rtc in 3-4 wks and I will monitor and make changes as neccessary.    His updated medication list for this problem includes:    Lisinopril 10 Mg Tabs (Lisinopril) .Marland Kitchen... 1 tab by mouth daily for blood pressure    Diltiazem Hcl Er Beads 240 Mg Xr24h-cap (Diltiazem hcl er beads) .Marland Kitchen... 1 capsule by mouth daily for heart rate    Metoprolol Tartrate 25 Mg Tabs (Metoprolol tartrate) .Marland Kitchen... 1/2 tab by mouth two times a day for blood pressure  Orders: [facility]- Est  Level 4 (99214)Future Orders: Comp Met-[facility] ([id number]) ... 06/17/2011 CBC-[facility] (57846) ... 06/16/2011  Problem # 3:  HYPERCHOLESTEROLEMIA (ICD-272.0) Assessment: Deteriorated No FLP since 04/2009 at which time LDL was 100 amd HDL 26.  I've ordered FLP for review.  Pt has been on Simvastatin 80mg .  Risks of rhabdo and myositis is increased on this dose of Simva.  Pt also on Diltiazem for Afib.  Simva + Dilt can have increased risk of myositis, hepatitis.  Studies was only with Simva.  May consider switching to Pravastatin which in on [facility] formulary, or Lipitor as it is becoming generic.    His updated medication list for this problem includes:    Simvastatin 80 Mg Tabs (Simvastatin) .Marland Kitchen... 1 tab by mouth at bedtime for cholesterol  Orders: [facility]- Est  Level 4 (99214)Future Orders: Lipid-[facility] ([id number]) ... 06/17/2011  Problem # 4:  ATRIAL FIBRILLATION (ICD-427.31) Assessment: Comment Only  Will resume diltiazem.  Currently regular rhythm.   His updated medication list for this problem includes:    Aspirin Ec Low Dose 81 Mg Tbec (Aspirin) .Marland Kitchen... 1 tab by mouth daily    Diltiazem Hcl Er Beads 240 Mg Xr24h-cap (Diltiazem hcl er beads) .Marland Kitchen... 1 capsule by mouth daily for heart rate    Metoprolol Tartrate 25 Mg Tabs (Metoprolol tartrate) .Marland Kitchen... 1/2 tab by mouth two times a day for blood pressure  Orders: [facility]- Est  Level 4 (99214)  Complete Medication List: 1)  Onetouch  Test Strp (Glucose blood) .... Test blood glucose as directed  2)  Lisinopril 10 Mg Tabs (Lisinopril) .Marland Kitchen.. 1 tab by mouth daily for blood pressure 3)  Metformin Hcl 1000 Mg Tabs (Metformin hcl) .... One tablet twice daily prior to meals for diabetes 4)  Paroxetine Hcl 40 Mg Tabs (Paroxetine hcl) .Marland Kitchen.. 1 tab by mouth daily for nerves 5)  Onetouch Lancets Misc (Lancets) .... Test blood sugar as directed 6)  Glipizide 5 Mg Tabs (Glipizide) .Marland Kitchen.. 1 tab by mouth two times a day for diabetes 7)  Klonopin 0.5 Mg Tabs (Clonazepam) .Marland Kitchen.. 1 tab by mouth two times a day for anxiety 8)  Simvastatin 80 Mg Tabs (Simvastatin) .Marland Kitchen.. 1 tab by mouth at bedtime for cholesterol 9)  Aspirin Ec Low Dose 81 Mg Tbec (Aspirin) .Marland Kitchen.. 1 tab by mouth daily 10)  Neurontin 300 Mg Caps (Gabapentin) .... One tablet by mouth daily for pain 11)  Lantus Solostar 100 Unit/ml Soln (Insulin glargine) .... Inject 15 units every morning. dispense one month supply. 12)  Bd Insulin Syringe Microfine 28g X 1/2" 1 Ml Misc (Insulin syringe-needle u-100) .... Dispense quantity sufficient for one month supply with daily dosing.  dispense one box. 13)  Triamcinolone Acetonide 0.1 % Oint (Triamcinolone acetonide) .... Apply to both legs three times a day for rash and itchiness. 14)  Diltiazem Hcl Er Beads 240 Mg Xr24h-cap (Diltiazem hcl er beads) .Marland Kitchen.. 1 capsule by mouth daily for heart rate 15)  Metoprolol Tartrate 25 Mg Tabs (Metoprolol tartrate) .... 1/2 tab by mouth two times a day for blood pressure 16)  Bd Pen Needle Mini U/f 31g X 5 Mm Misc (Insulin pen needle) .... Use as directed 17)  Nitrostat 0.4 Mg Subl (Nitroglycerin) .... Place 1 on tongue when you have chest pain. if after the 2nd one you still have chest pain, go to er  Other Orders: Influenza Vaccine MCR (30865)  Patient Instructions: 1)  Please schedule a follow-up appointment in 3-4 weeks for diabetes and blood pressure. 2)  Please make appt for cholesterol check.  This has to be  fasting, so no food or drink after midnight the night before appointment. 3)  Please bring your medicine next time you come in.  4)  Tobacco is very bad for your health and your loved ones ! You should stop smoking !  5)  Stop smoking tips: Choose a quit date. Cut down before the quit date. Decide what you will do as a substitute when you feel the urge to smoke(gum, toothpick, exercise).  6)  Take an Aspirin every day.  Prescriptions: NEURONTIN 300 MG CAPS (GABAPENTIN) one tablet by mouth daily  #30 x 3   Entered and Authorized by:   Angeline Slim MD   Signed by:   Angeline Slim MD on 06/09/2010   Method used:   [facility] [address] [phone]* (retail)       [address]       [place], [place]  [address]       Ph: [phone] or [phone]       Fax: [phone]   RxID:   [id number] LANTUS SOLOSTAR 100 UNIT/ML SOLN (INSULIN GLARGINE) inject 15 units every morning. Dispense one month supply.  #1 x 3   Entered and Authorized by:   Angeline Slim MD   Signed by:   Angeline Slim MD on 06/09/2010   Method used:   [facility]  [facility] [address] [phone]* (retail)       [address]       [place], [place]  [address]       Ph: [phone] or [phone]       Fax: [phone]   RxID:   [id number] BD INSULIN SYRINGE MICROFINE 28G X 1/2" 1 ML MISC (INSULIN SYRINGE-NEEDLE U-100) Dispense quantity sufficient for one month supply with daily dosing.  Dispense one box.  #1 x 11   Entered and Authorized by:   Angeline Slim MD   Signed by:   Angeline Slim MD on 06/09/2010   Method used:   [facility]ronically to        [facility] [address] [phone]* (retail)       [address]       [place], [place]  [address]       Ph: [phone] or [phone]       Fax: [phone]   RxID:   [id number] TRIAMCINOLONE ACETONIDE 0.1 % OINT (TRIAMCINOLONE ACETONIDE) apply to both legs three times a day for rash and itchiness.  #1 x 0    Entered and Authorized by:   Angeline Slim MD   Signed by:   Angeline Slim MD on 06/09/2010   Method used:   [facility]ronically to        [facility] [address] [phone]* (retail)       [address]       [place], [place]  [address]       Ph: [phone] or [phone]       Fax: [phone]   RxID:   [id number] DILTIAZEM HCL ER BEADS 240 MG XR24H-CAP (DILTIAZEM HCL ER BEADS) 1 capsule by mouth daily for heart rate  #34 x 6   Entered and Authorized by:   Angeline Slim MD   Signed by:   Angeline Slim MD on 06/09/2010   Method used:   [facility]ronically to        [name] [address] [phone]* (retail)       [address]       [place], [place]  [address]       Ph: [phone] or [phone]       Fax: [phone]   RxID:   [id number] METOPROLOL TARTRATE 25 MG TABS (METOPROLOL TARTRATE) 1/2 tab by mouth two times a day  #34 x 6   Entered and Authorized by:   Angeline Slim MD   Signed by:   Angeline Slim MD on 06/09/2010   Method used:   Electronically to        [facility] [address] [phone]* (retail)       [address]       [place], [place]  [address]       Ph: [phone] or [phone]       Fax: [phone]   RxID:   [id number] BD PEN NEEDLE MINI U/F 31G X 5 MM MISC (INSULIN PEN NEEDLE) use as directed  #100 x 99   Entered and Authorized by:   Angeline Slim MD   Signed by:   Angeline Slim MD on 06/09/2010   Method used:   Electronically to        [facility] [address] [phone]* (retail)  [address]       [place], [place]  [address]       Ph: [phone] or [phone]       Fax: [phone]   RxID:   [id number] NITROSTAT 0.4 MG SUBL (NITROGLYCERIN) place 1 on tongue when you have chest pain. If after the 2nd one you still have chest pain, go to ER  #34 x 1   Entered and Authorized by:   Angeline Slim MD   Signed by:   Angeline Slim MD on 06/09/2010   Method used:   Electronically to        [name] [address] [phone]* (retail)        [address]       [place], [place]  [address]       Ph: [phone] or [phone]       Fax: [phone]   RxID:   [id number] SIMVASTATIN 80 MG TABS (SIMVASTATIN) 1 tab by mouth at bedtime  #34 x 6   Entered and Authorized by:   Angeline Slim MD   Signed by:   Angeline Slim MD on 06/09/2010   Method used:   Electronically to        [facility] [address] [phone]* (retail)       [address]       [place], [place]  [address]       Ph: [phone] or [phone]       Fax: [phone]   RxID:   [id number] GLIPIZIDE 5 MG  TABS (GLIPIZIDE) 1 tab by mouth two times a day  #66 x 6   Entered and Authorized by:   Angeline Slim MD   Signed by:   Angeline Slim MD on 06/09/2010   Method used:   Electronically to        [facility] [address] [phone]* (retail)       [address]       [place], [place]  [address]       Ph: [phone] or [phone]       Fax: [phone]   RxID:   [id number] PAROXETINE HCL 40 MG  TABS (PAROXETINE HCL) 1 tab by mouth daily  #34 x 6   Entered and Authorized by:   Angeline Slim MD   Signed by:   Angeline Slim MD on 06/09/2010   Method used:   Electronically to        [facility] [address] [phone]* (retail)       [address]       [place], [place]  [address]       Ph: [phone] or [phone]       Fax: [phone]   RxID:   [id number] ONETOUCH LANCETS   MISC (LANCETS) test blood sugar as directed  #90 x 11   Entered and Authorized by:   Angeline Slim MD   Signed by:   Angeline Slim MD on 06/09/2010   Method used:   Electronically to        [facility] [address] [phone]* (retail)       [address]       [place], [place]  [address]       Ph:  [phone] or [phone]       Fax: [phone]   RxID:   [id number] METFORMIN HCL 1000 MG TABS (METFORMIN HCL) one tablet twice daily prior to meals  #60 Tablet x 6   Entered and Authorized by:   Angeline Slim MD   Signed  by:   Angeline Slim MD on 06/09/2010   Method used:   Electronically to        [facility] [address] [phone]* (retail)       [address]       [place], [place]  [address]       Ph: [phone] or [phone]       Fax: [phone]   RxID:   [id number] LISINOPRIL 10 MG  TABS (LISINOPRIL) 1 tab by mouth daily  #34 x 6   Entered and Authorized by:   Angeline Slim MD   Signed by:   Angeline Slim MD on 06/09/2010   Method used:   Electronically to        [facility] [address] [phone]* (retail)       [address]       [place], [place]  [address]       Ph: [phone] or [phone]       Fax: [phone]   RxID:   [id number] [facility] TEST   STRP (GLUCOSE BLOOD) test blood glucose as directed  #60 x 11   Entered and Authorized by:   Angeline Slim MD   Signed by:   Angeline Slim MD on 06/09/2010   Method used:   Electronically to        [facility] [address] [phone]* (retail)       [address]       [place], [place]  [address]       Ph: [phone] or [phone]       Fax: [phone]   RxID:   [id number]    Orders Added: 1)  A1C-[facility] [83036] 2)  Comp Met-[facility] [92119-41740] 3)  CBC-[facility] [85027] 4)  Lipid-[facility] [80061-22930] 5)  Influenza Vaccine MCR [00025] 6)  [facility]- Est  Level 4 [81448]   Immunizations Administered:  Influenza Vaccine # 1:    Vaccine Type: Fluvax MCR    Site: left deltoid    Mfr: GlaxoSmithKline    Dose: 0.5 ml    Route: IM    Given by: Tessie Fass CMA    Exp. Date: 02/02/2011    Lot #: [id number]    VIS given: 03/02/10 version given June 09, 2010.  Flu Vaccine Consent Questions:    Do you have a history of severe allergic reactions to this vaccine? no    Any prior history of allergic reactions to egg and/or gelatin? no    Do you have a sensitivity to the preservative Thimersol? no    Do you have a past history of Guillan-Barre Syndrome? no    Do you currently have an acute febrile illness? no     Have you ever had a severe reaction to latex? no    Vaccine information given and explained to patient? yes   Immunizations Administered:  Influenza Vaccine # 1:    Vaccine Type: Fluvax MCR    Site: left deltoid    Mfr: GlaxoSmithKline    Dose: 0.5 ml    Route: IM    Given by:  Tessie Fass CMA    Exp. Date: 02/02/2011    Lot #: [id number]    VIS given: 03/02/10 version given June 09, 2010.  Laboratory Results   Blood Tests   Date/Time Received: June 09, 2010 2:00 PM  Date/Time Reported: June 09, 2010 2:10 PM   HGBA1C: >14.0%   (Normal Range: Non-Diabetic - 3-6%   Control Diabetic - 6-8%)  Comments: ...........test performed by...........Marland KitchenTerese Door, CMA

## 2010-09-09 NOTE — Miscellaneous (Signed)
Summary: Changing prob list   Clinical Lists Changes  Problems: Removed problem of SPECIAL SCREENING FOR MALIGNANT NEOPLASMS COLON (ICD-V76.51) Removed problem of SPECIAL SCREENING MALIGNANT NEOPLASM OF PROSTATE (ICD-V76.44) Removed problem of LUNG NODULE (ICD-518.89) Removed problem of ANGER (ICD-312.00) Observations: Added new observation of PAST MED HX: -Mental retardation -tobacco dependence -h/o Anemia, iron def, -Diabetes mellitus, type II -Hypertension -Anxiety -Hyperlipidemia -A fib (06/2009)  -Hx of lung nodul -Chronic low back pain  **History of noncompliance (06/18/2010 9:56) Added new observation of PRIMARY MD: Cat Ta MD (06/18/2010 9:56)       Past History:  Past Medical History: -Mental retardation -tobacco dependence -h/o Anemia, iron def, -Diabetes mellitus, type II -Hypertension -Anxiety -Hyperlipidemia -A fib (06/2009)  -Hx of lung nodul -Chronic low back pain  **History of noncompliance

## 2010-09-09 NOTE — Progress Notes (Signed)
Summary: refill  Phone Note Refill Request Call back at [phone] Message from:  sister-Linda  Refills Requested: Medication #1:  KLONOPIN 0.5 MG  TABS 1 tab by mouth two times a day for anxiety  Medication #2:  ONETOUCH TEST   STRP test blood glucose as directed also he was not able to go to GI doc and needs to resched  Initial call taken by: De Nurse,  August 13, 2009 2:35 PM  Follow-up for Phone Call        to pcp Follow-up by: Golden Circle RN,  August 13, 2009 2:44 PM  Additional Follow-up for Phone Call Additional follow up Details #1::       Additional Follow-up by: Golden Circle RN,  August 14, 2009 3:29 PM    [facility] [address]  Glucose test strips refilled on 04/10/09 with 11 refills.    According to orders, Pt's appt was on 08/10/09.  Since he has missed it he will need to call to reschedule since we cannot do that for him.  Mccoy Testa MD  August 13, 2009 3:26 PM  I called them in & pt has appt later this month with pcp.Golden Circle RN  August 14, 2009 3:35 PM

## 2010-09-09 NOTE — Miscellaneous (Signed)
Summary: [facility] radiology  Clinical Lists Changes rec'd message that pt [facility] his appt with [facility].Golden Circle RN  October 22, 2009 12:04 PM   Tried calling pt at home number but no answer.  No voicemail option either. Lakeisha Waldrop MD  October 23, 2009 10:56 AM

## 2010-09-10 DIAGNOSIS — R6520 Severe sepsis without septic shock: Secondary | ICD-10-CM

## 2010-09-10 DIAGNOSIS — G039 Meningitis, unspecified: Secondary | ICD-10-CM

## 2010-09-10 DIAGNOSIS — R6521 Severe sepsis with septic shock: Secondary | ICD-10-CM

## 2010-09-10 DIAGNOSIS — A419 Sepsis, unspecified organism: Secondary | ICD-10-CM

## 2010-09-10 LAB — BASIC METABOLIC PANEL
Calcium: 7.8 mg/dL — ABNORMAL LOW (ref 8.4–10.5)
Calcium: 7.8 mg/dL — ABNORMAL LOW (ref 8.4–10.5)
Creatinine, Ser: 0.97 mg/dL (ref 0.4–1.5)
GFR calc Af Amer: >60 mL/min (ref >60)
GFR calc Af Amer: >60 mL/min (ref >60)
GFR calc non Af Amer: >60 mL/min (ref >60)
GFR calc non Af Amer: >60 mL/min (ref >60)
Glucose, Bld: 211 mg/dL — ABNORMAL HIGH (ref 70–99)
Potassium: 3.5 mEq/L (ref 3.5–5.1)
Sodium: 138 mEq/L (ref 135–145)
Sodium: 138 mEq/L (ref 135–145)

## 2010-09-10 LAB — DIFFERENTIAL
Eosinophils Absolute: 0.2 10*3/uL (ref 0.0–0.7)
Eosinophils Relative: 1 % (ref 0–5)
Lymphocytes Relative: 17 % (ref 12–46)
Lymphs Abs: 2.8 10*3/uL (ref 0.7–4.0)
Monocytes Relative: 9 % (ref 3–12)

## 2010-09-10 LAB — VANCOMYCIN, TROUGH: Vancomycin Tr: 22.1 ug/mL — ABNORMAL HIGH (ref 10.0–20.0)

## 2010-09-10 LAB — HIV ANTIBODY (ROUTINE TESTING W REFLEX): HIV: NONREACTIVE

## 2010-09-10 LAB — COMPREHENSIVE METABOLIC PANEL
Albumin: 2.5 g/dL — ABNORMAL LOW (ref 3.5–5.2)
Alkaline Phosphatase: 45 U/L (ref 39–117)
BUN: 18 mg/dL (ref 6–23)
Chloride: 105 mEq/L (ref 96–112)
Creatinine, Ser: 1 mg/dL (ref 0.4–1.5)
GFR calc non Af Amer: >60 mL/min (ref >60)
Glucose, Bld: 212 mg/dL — ABNORMAL HIGH (ref 70–99)
Total Bilirubin: 0.6 mg/dL (ref 0.3–1.2)

## 2010-09-10 LAB — GLUCOSE, CAPILLARY
Glucose-Capillary: 158 mg/dL — ABNORMAL HIGH (ref 70–99)
Glucose-Capillary: 164 mg/dL — ABNORMAL HIGH (ref 70–99)
Glucose-Capillary: 190 mg/dL — ABNORMAL HIGH (ref 70–99)
Glucose-Capillary: 198 mg/dL — ABNORMAL HIGH (ref 70–99)
Glucose-Capillary: 206 mg/dL — ABNORMAL HIGH (ref 70–99)

## 2010-09-10 LAB — CBC
HCT: 33.4 % — ABNORMAL LOW (ref 39.0–52.0)
MCH: 25 pg — ABNORMAL LOW (ref 26.0–34.0)
MCV: 75.9 fL — ABNORMAL LOW (ref 78.0–100.0)
Platelets: 279 10*3/uL (ref 150–400)
RBC: 4.4 MIL/uL (ref 4.22–5.81)
RDW: 13.8 % (ref 11.5–15.5)

## 2010-09-10 LAB — MAGNESIUM: Magnesium: 1.8 mg/dL (ref 1.5–2.5)

## 2010-09-10 LAB — PHOSPHORUS: Phosphorus: 1.5 mg/dL — ABNORMAL LOW (ref 2.3–4.6)

## 2010-09-10 LAB — HERPES SIMPLEX VIRUS(HSV) DNA BY PCR: HSV 2 DNA: NOT DETECTED

## 2010-09-10 LAB — PROCALCITONIN: Procalcitonin: 0.45 ng/mL

## 2010-09-11 ENCOUNTER — Inpatient Hospital Stay (HOSPITAL_COMMUNITY): Payer: No Typology Code available for payment source

## 2010-09-11 ENCOUNTER — Inpatient Hospital Stay (HOSPITAL_COMMUNITY): Payer: Self-pay

## 2010-09-11 LAB — GLUCOSE, CAPILLARY: Glucose-Capillary: 247 mg/dL — ABNORMAL HIGH (ref 70–99)

## 2010-09-11 LAB — CBC
HCT: 30.2 % — ABNORMAL LOW (ref 39.0–52.0)
Hemoglobin: 9.9 g/dL — ABNORMAL LOW (ref 13.0–17.0)
MCHC: 32.8 g/dL (ref 30.0–36.0)
MCV: 76.3 fL — ABNORMAL LOW (ref 78.0–100.0)
RDW: 13.8 % (ref 11.5–15.5)

## 2010-09-11 LAB — HEPATITIS PANEL, ACUTE: Hepatitis B Surface Ag: NEGATIVE

## 2010-09-11 LAB — COMPREHENSIVE METABOLIC PANEL
ALT: 16 U/L (ref 0–53)
AST: 27 U/L (ref 0–37)
Albumin: 2.3 g/dL — ABNORMAL LOW (ref 3.5–5.2)
Alkaline Phosphatase: 46 U/L (ref 39–117)
GFR calc Af Amer: >60 mL/min (ref >60)
Potassium: 3.5 mEq/L (ref 3.5–5.1)
Sodium: 136 mEq/L (ref 135–145)
Total Protein: 5.1 g/dL — ABNORMAL LOW (ref 6.0–8.3)

## 2010-09-11 LAB — STREP PNEUMONIAE URINARY ANTIGEN: Strep Pneumo Urinary Antigen: NEGATIVE

## 2010-09-11 LAB — CULTURE, BAL-QUANTITATIVE W GRAM STAIN: Colony Count: >=100000

## 2010-09-11 LAB — DIFFERENTIAL
Eosinophils Relative: 3 % (ref 0–5)
Lymphocytes Relative: 13 % (ref 12–46)
Lymphs Abs: 1.2 10*3/uL (ref 0.7–4.0)
Monocytes Absolute: 0.8 10*3/uL (ref 0.1–1.0)
Neutro Abs: 6.8 10*3/uL (ref 1.7–7.7)

## 2010-09-12 LAB — GLUCOSE, CAPILLARY
Glucose-Capillary: 138 mg/dL — ABNORMAL HIGH (ref 70–99)
Glucose-Capillary: 156 mg/dL — ABNORMAL HIGH (ref 70–99)
Glucose-Capillary: 166 mg/dL — ABNORMAL HIGH (ref 70–99)
Glucose-Capillary: 206 mg/dL — ABNORMAL HIGH (ref 70–99)
Glucose-Capillary: 210 mg/dL — ABNORMAL HIGH (ref 70–99)

## 2010-09-12 LAB — BASIC METABOLIC PANEL
BUN: 7 mg/dL (ref 6–23)
CO2: 20 mEq/L (ref 19–32)
Chloride: 106 mEq/L (ref 96–112)
Creatinine, Ser: 0.77 mg/dL (ref 0.4–1.5)
Glucose, Bld: 161 mg/dL — ABNORMAL HIGH (ref 70–99)
Potassium: 3.8 mEq/L (ref 3.5–5.1)

## 2010-09-12 LAB — CBC
HCT: 32.6 % — ABNORMAL LOW (ref 39.0–52.0)
MCH: 24.7 pg — ABNORMAL LOW (ref 26.0–34.0)
MCV: 74.4 fL — ABNORMAL LOW (ref 78.0–100.0)
Platelets: 227 10*3/uL (ref 150–400)
RBC: 4.38 MIL/uL (ref 4.22–5.81)
WBC: 7.3 10*3/uL (ref 4.0–10.5)

## 2010-09-13 ENCOUNTER — Inpatient Hospital Stay (HOSPITAL_COMMUNITY): Payer: No Typology Code available for payment source

## 2010-09-13 LAB — CBC
HCT: 31.4 % — ABNORMAL LOW (ref 39.0–52.0)
MCH: 24.9 pg — ABNORMAL LOW (ref 26.0–34.0)
MCV: 75.8 fL — ABNORMAL LOW (ref 78.0–100.0)
Platelets: 243 10*3/uL (ref 150–400)
RBC: 4.14 MIL/uL — ABNORMAL LOW (ref 4.22–5.81)
RDW: 13.9 % (ref 11.5–15.5)

## 2010-09-13 LAB — BASIC METABOLIC PANEL
BUN: 5 mg/dL — ABNORMAL LOW (ref 6–23)
Chloride: 107 mEq/L (ref 96–112)
Creatinine, Ser: 0.7 mg/dL (ref 0.4–1.5)
Glucose, Bld: 137 mg/dL — ABNORMAL HIGH (ref 70–99)

## 2010-09-13 LAB — CSF CULTURE W GRAM STAIN

## 2010-09-13 LAB — GLUCOSE, CAPILLARY
Glucose-Capillary: 109 mg/dL — ABNORMAL HIGH (ref 70–99)
Glucose-Capillary: 158 mg/dL — ABNORMAL HIGH (ref 70–99)

## 2010-09-13 LAB — RPR: RPR Ser Ql: NONREACTIVE

## 2010-09-14 ENCOUNTER — Inpatient Hospital Stay (HOSPITAL_COMMUNITY): Payer: No Typology Code available for payment source

## 2010-09-14 ENCOUNTER — Other Ambulatory Visit (HOSPITAL_COMMUNITY): Payer: Self-pay

## 2010-09-14 DIAGNOSIS — G039 Meningitis, unspecified: Secondary | ICD-10-CM

## 2010-09-14 DIAGNOSIS — A419 Sepsis, unspecified organism: Secondary | ICD-10-CM

## 2010-09-14 DIAGNOSIS — R6520 Severe sepsis without septic shock: Secondary | ICD-10-CM

## 2010-09-14 DIAGNOSIS — R6521 Severe sepsis with septic shock: Secondary | ICD-10-CM

## 2010-09-14 LAB — PROTIME-INR
INR: 0.94 (ref 0.00–1.49)
Prothrombin Time: 12.8 seconds (ref 11.6–15.2)

## 2010-09-14 LAB — BASIC METABOLIC PANEL
BUN: 5 mg/dL — ABNORMAL LOW (ref 6–23)
CO2: 25 mEq/L (ref 19–32)
Calcium: 8.7 mg/dL (ref 8.4–10.5)
Chloride: 104 mEq/L (ref 96–112)
Creatinine, Ser: 0.88 mg/dL (ref 0.4–1.5)
GFR calc Af Amer: >60 mL/min (ref >60)
Glucose, Bld: 103 mg/dL — ABNORMAL HIGH (ref 70–99)

## 2010-09-14 LAB — GLUCOSE, CAPILLARY: Glucose-Capillary: 108 mg/dL — ABNORMAL HIGH (ref 70–99)

## 2010-09-14 MED ORDER — GADOBENATE DIMEGLUMINE 529 MG/ML IV SOLN
20.0000 mL | Freq: Once | INTRAVENOUS | Status: AC | PRN
  Administered 2010-09-14: 20 mL via INTRAVENOUS

## 2010-09-15 ENCOUNTER — Inpatient Hospital Stay (HOSPITAL_COMMUNITY): Payer: No Typology Code available for payment source

## 2010-09-15 DIAGNOSIS — I6529 Occlusion and stenosis of unspecified carotid artery: Secondary | ICD-10-CM

## 2010-09-15 LAB — CULTURE, BLOOD (ROUTINE X 2)
Culture  Setup Time: 201202020341
Culture: NO GROWTH

## 2010-09-15 LAB — GLUCOSE, CAPILLARY
Glucose-Capillary: 122 mg/dL — ABNORMAL HIGH (ref 70–99)
Glucose-Capillary: 136 mg/dL — ABNORMAL HIGH (ref 70–99)
Glucose-Capillary: 152 mg/dL — ABNORMAL HIGH (ref 70–99)
Glucose-Capillary: 71 mg/dL (ref 70–99)
Glucose-Capillary: 96 mg/dL (ref 70–99)

## 2010-09-15 LAB — CBC
HCT: 31.6 % — ABNORMAL LOW (ref 39.0–52.0)
MCHC: 32.9 g/dL (ref 30.0–36.0)
MCV: 76.7 fL — ABNORMAL LOW (ref 78.0–100.0)
Platelets: 293 10*3/uL (ref 150–400)
RDW: 14.3 % (ref 11.5–15.5)

## 2010-09-15 LAB — BASIC METABOLIC PANEL
BUN: 7 mg/dL (ref 6–23)
Calcium: 7.6 mg/dL — ABNORMAL LOW (ref 8.4–10.5)
GFR calc non Af Amer: >60 mL/min (ref >60)
Glucose, Bld: 127 mg/dL — ABNORMAL HIGH (ref 70–99)

## 2010-09-15 LAB — PROTIME-INR: INR: 1 (ref 0.00–1.49)

## 2010-09-15 NOTE — Assessment & Plan Note (Signed)
Summary: Hospital Admission H&P   Primary Provider:  Cat Ta MD  CC:  AMS.  History of Present Illness: History obtained from pt's sister, sister in law and niece.  Pt was in his normal state of health this morning. He was called on around 11 AM and did not respond. He was found down at 3 PM by a worker at his motel and his sister and Social worker.  They found the patient down on his side, slighly jerking his UEs and rolling his eye back. There was dried blood in his mouth. No lose of bowel or bladder. EMS was called and he was brought in where he was found to be febrile, tachy.   Per sister's pt has been out of some meds x 1 weeks. Has been taking his insulin as prescribed.   ED course: Pt received Tylenol 1 gm PR x 1, Vanc IV x 1, and Cefepime IV x 1.     Allergies: 1)  ! Penicillin  Past History:  Past Medical History: -Cognitve impairment.  -tobacco dependence -h/o Anemia, iron def, -Diabetes mellitus, type II -Hypertension -Anxiety -Hyperlipidemia -A fib (06/2009), [place] score 2  -Hx of lung nodule -Chronic low back pain  **History of noncompliance  Family History: Reviewed history from 10/05/2006 and no changes required. 3 sisters, 1 brother who have stomach problems & HTN, but they don`t f/u w/ MD often., 4 half brothers and sisters., Dad died age 75 from heart problems., Mom died age 84 from an MI.  Social History: Reviewed history from 06/09/2010 and no changes required. Lives in motel [facility], [phone]), single, no kids.   Released from prison in 08/2001 after serving 2 yrs for indecency w/ a minor.  Smokes cigarettes >30 years.  Also smokes pipe.  Denies ETOH/Drugs - states they do nothing for him.  Review of Systems       Unable to obtain  Physical Exam  General:  T 102.7, HR 118-135, RR 22-33, O2 Sat 100 % on face mask. BP 167/73. Adult male. Responsive to pain.  Non verbal. Not following command. Voluntary movements.  Head:  normocephalic and  atraumatic.   Eyes:  pupils equal, pupils round, and pupils reactive to light.   Mouth:  Oral mucosa and oropharynx without lesions or exudates.   Neck:  No deformities, masses, or tenderness noted. Lungs:  Increased respiratory rate. Snoring. Symmetrical chest motion. Lungs are clear to auscultation, no crackles or wheezes. Heart:  Sinus tach S1 and S2 normal without gallop, murmur, click, rub or other extra sounds. Abdomen:  Bowel sounds positive,abdomen soft ? tenderness Large R sided hernia Multiple shallow ulcers off various stages on abdomen.  Msk:  normal ROM, no joint tenderness, and no joint swelling.   Pulses:  R radial normal, R dorsalis pedis normal, L radial normal, and L dorsalis pedis normal.   Extremities:  No clubbing, cyanosis, edema, or deformity noted with normal full range of motion of all joints.   Neurologic:  GCS 11= E4 V2 M5 Skin:  Warm Pink  Psych:  Unable to obtain Additional Exam:  CBC: 18.5>13.6/40.7<330 MCV 75 N 87% CMET: 133/4.9/94/22/11/0.94<396 AST 29, ALT 24, Alb 3.9, T Protein 78, Calcium 9.8, INR 0.98 Lipase 15 lactic acid: 4.9 UDS: positive for BZ. CE: neg x 1 set  UA: > 1000 glucose, Lrg Hgb, 40 Ketones, otherwise neg. U mocro: 11-20 RBCs. Tylenol and Aspirin levels WNL  Procalcitonin: < 0.10  EKG: sinus TC CT Head: Nonspecific hypodensity in  the left thalamus, most commonly due   to small vessel ischemia.  Otherwise, no acute intracranial abnormality.   2.  Chronic right maxillary sinusitis.   Impression & Recommendations:  Problem # 1:  AMS Concerning for seizure vs. meningits vs. sepsis.  CT of head WNL. Admit to ICU. Inform CCM. WIll obtain CSF for analysis and culture. Will have low threshold to intubate if pt's becomes labored.   Problem # 2:  ID Pt meets criteria for sepsis: fever, elevated HR, elevated WBC, source of infection.  Possible cellulitus --> sepsis. Possibles meningitis.   Will continue Abx- Cefepime,  vanc, flagyl.  Tylenol for fever.  f/u blood and urine cx.  CT of abdomen to r/o abdominal abscess.  LP to obtain CSF for analysis and culture.   Problem # 3:  Hyperglycemia  Last A1c>14 Elevated 2/2 noncompliance and infection.  Will start glucomander protocol.   Problem # 4:  HYPERTENSION (ICD-401.9) Holding anti-HTN for now.   Problem # 5:  FEN/GI NPO. F/u AM BEMT.   Problem # 6:  PPX Heparin 5000 [place] TID  Problem # 7:  Dispo Pending clinical improvement.   Complete Medication List: 1)  Onetouch Test Strp (Glucose blood) .... Test blood glucose as directed 2)  Lisinopril 10 Mg Tabs (Lisinopril) .Marland Kitchen.. 1 tab by mouth daily for blood pressure 3)  Metformin Hcl 1000 Mg Tabs (Metformin hcl) .... One tablet twice daily prior to meals for diabetes 4)  Paroxetine Hcl 40 Mg Tabs (Paroxetine hcl) .Marland Kitchen.. 1 tab by mouth daily for nerves 5)  Onetouch Lancets Misc (Lancets) .... Test blood sugar as directed 6)  Glipizide 5 Mg Tabs (Glipizide) .Marland Kitchen.. 1 tab by mouth two times a day for diabetes 7)  Klonopin 0.5 Mg Tabs (Clonazepam) .Marland Kitchen.. 1 tab by mouth two times a day for anxiety 8)  Lipitor 40 Mg Tabs (Atorvastatin calcium) .Marland Kitchen.. 1 tab by mouth at bedtime for cholesterol.  do not take simvastatin while taking this. 9)  Aspirin Ec Low Dose 81 Mg Tbec (Aspirin) .Marland Kitchen.. 1 tab by mouth daily 10)  Neurontin 300 Mg Caps (Gabapentin) .... One tablet by mouth daily for pain 11)  Lantus Solostar 100 Unit/ml Soln (Insulin glargine) .... Inject 15 units every morning. dispense one month supply. 12)  Bd Insulin Syringe Microfine 28g X 1/2" 1 Ml Misc (Insulin syringe-needle u-100) .... Dispense quantity sufficient for one month supply with daily dosing.  dispense one box. 13)  Triamcinolone Acetonide 0.1 % Oint (Triamcinolone acetonide) .... Apply to both legs three times a day for rash and itchiness.  dispense for both legs. 14)  Diltiazem Hcl Er Beads 240 Mg Xr24h-cap (Diltiazem hcl er beads) .Marland Kitchen.. 1 capsule by  mouth daily for heart rate 15)  Metoprolol Tartrate 25 Mg Tabs (Metoprolol tartrate) .... 1/2 tab by mouth two times a day for blood pressure 16)  Bd Pen Needle Mini U/f 31g X 5 Mm Misc (Insulin pen needle) .... Use as directed 17)  Nitrostat 0.4 Mg Subl (Nitroglycerin) .... Place 1 on tongue when you have chest pain. if after the 2nd one you still have chest pain, go to er

## 2010-09-16 ENCOUNTER — Inpatient Hospital Stay (HOSPITAL_COMMUNITY): Payer: No Typology Code available for payment source

## 2010-09-16 DIAGNOSIS — I6789 Other cerebrovascular disease: Secondary | ICD-10-CM

## 2010-09-16 LAB — CBC
MCH: 25.3 pg — ABNORMAL LOW (ref 26.0–34.0)
MCHC: 32.6 g/dL (ref 30.0–36.0)
Platelets: 293 10*3/uL (ref 150–400)
RDW: 14.6 % (ref 11.5–15.5)

## 2010-09-16 LAB — BASIC METABOLIC PANEL
CO2: 23 mEq/L (ref 19–32)
Calcium: 8.8 mg/dL (ref 8.4–10.5)
Creatinine, Ser: 0.97 mg/dL (ref 0.4–1.5)
GFR calc Af Amer: >60 mL/min (ref >60)
GFR calc non Af Amer: >60 mL/min (ref >60)

## 2010-09-16 LAB — GLUCOSE, CAPILLARY
Glucose-Capillary: 96 mg/dL (ref 70–99)
Glucose-Capillary: 87 mg/dL (ref 70–99)

## 2010-09-16 MED ORDER — IOHEXOL 350 MG/ML SOLN
100.0000 mL | Freq: Once | INTRAVENOUS | Status: AC | PRN
  Administered 2010-09-16: 100 mL via INTRAVENOUS

## 2010-09-17 DIAGNOSIS — I63239 Cerebral infarction due to unspecified occlusion or stenosis of unspecified carotid arteries: Secondary | ICD-10-CM

## 2010-09-17 LAB — GLUCOSE, CAPILLARY
Glucose-Capillary: 105 mg/dL — ABNORMAL HIGH (ref 70–99)
Glucose-Capillary: 169 mg/dL — ABNORMAL HIGH (ref 70–99)
Glucose-Capillary: 166 mg/dL — ABNORMAL HIGH (ref 70–99)
Glucose-Capillary: 157 mg/dL — ABNORMAL HIGH (ref 70–99)

## 2010-09-18 LAB — GLUCOSE, CAPILLARY
Glucose-Capillary: 123 mg/dL — ABNORMAL HIGH (ref 70–99)
Glucose-Capillary: 161 mg/dL — ABNORMAL HIGH (ref 70–99)
Glucose-Capillary: 159 mg/dL — ABNORMAL HIGH (ref 70–99)

## 2010-09-19 DIAGNOSIS — I63239 Cerebral infarction due to unspecified occlusion or stenosis of unspecified carotid arteries: Secondary | ICD-10-CM

## 2010-09-19 LAB — GLUCOSE, CAPILLARY
Glucose-Capillary: 161 mg/dL — ABNORMAL HIGH (ref 70–99)
Glucose-Capillary: 111 mg/dL — ABNORMAL HIGH (ref 70–99)
Glucose-Capillary: 215 mg/dL — ABNORMAL HIGH (ref 70–99)

## 2010-09-20 ENCOUNTER — Inpatient Hospital Stay (HOSPITAL_COMMUNITY): Payer: No Typology Code available for payment source

## 2010-09-20 DIAGNOSIS — I6529 Occlusion and stenosis of unspecified carotid artery: Secondary | ICD-10-CM

## 2010-09-20 LAB — BASIC METABOLIC PANEL
BUN: 11 mg/dL (ref 6–23)
CO2: 21 mEq/L (ref 19–32)
Calcium: 9.4 mg/dL (ref 8.4–10.5)
Calcium: 9.2 mg/dL (ref 8.4–10.5)
Creatinine, Ser: 1.21 mg/dL (ref 0.4–1.5)
Creatinine, Ser: 1.07 mg/dL (ref 0.4–1.5)
GFR calc Af Amer: >60 mL/min (ref >60)
GFR calc non Af Amer: >60 mL/min (ref >60)
GFR calc non Af Amer: >60 mL/min (ref >60)
Glucose, Bld: 148 mg/dL — ABNORMAL HIGH (ref 70–99)
Glucose, Bld: 189 mg/dL — ABNORMAL HIGH (ref 70–99)
Sodium: 133 mEq/L — ABNORMAL LOW (ref 135–145)

## 2010-09-20 LAB — GLUCOSE, CAPILLARY: Glucose-Capillary: 219 mg/dL — ABNORMAL HIGH (ref 70–99)

## 2010-09-20 LAB — CBC
HCT: 34.3 % — ABNORMAL LOW (ref 39.0–52.0)
MCH: 24.8 pg — ABNORMAL LOW (ref 26.0–34.0)
MCHC: 32.1 g/dL (ref 30.0–36.0)
RDW: 15.1 % (ref 11.5–15.5)

## 2010-09-20 NOTE — Consult Note (Signed)
NAMEJAMESPAUL, SECRIST              ACCOUNT NO.:  [id number]  MEDICAL RECORD NO.:  [id number]           PATIENT TYPE:  I  LOCATION:  2505                         FACILITY:  [facility]  PHYSICIAN:  Leiya Keesey P. Pearlean Brownie, MD    DATE OF BIRTH:  07-15-1956  DATE OF CONSULTATION: DATE OF DISCHARGE:                                CONSULTATION   REFERRING PHYSICIAN:  Paula Compton, MD  REASON FOR REFERRAL:  Stroke.  HISTORY OF PRESENT ILLNESS:  Mr. Goga is a 55 year old Caucasian male who was admitted on September 08, 2010, with fever and altered mental status.  He was thought to have sepsis and was treated with IV antibiotics, vancomycin and gentamicin.  He was initially intubated and was in respiratory failure.  He had a spinal tap which was inconclusive and showed 100 white cells with slightly elevated proteins with normal glucose and blood as well as CSF cultures were negative.  The patient was extubated and has gradual done well, but MRI scan of the brain was obtained yesterday which shows multiple small embolic infarcts involving mainly the left hemisphere and to a lesser extent the right hemisphere as well.  The patient was not noted as having any focal neurological symptoms in the form of slurred speech or extremity weakness, numbness. The patient himself denies any focal symptoms.  He states he has some mild gait and balance problems at baseline, which apparently are worse now.  He has never had previous stroke, TIA, seizures, or significant neurological problems.  PAST MEDICAL HISTORY:  Significant for: 1. Some mental retardation. 2. Atrial fibrillation. 3. Diabetes. 4. Hypertension. 5. Hyperlipidemia. 6. COPD.  CURRENT MEDICATIONS:  Rocephin, Bactrim, Plavix, Cardizem, gabapentin, insulin, albuterol, Atrovent, Lopressor, Protonix, Paxil, and Crestor.  MEDICATION ALLERGIES:  PENICILLIN.  SOCIAL HISTORY:  He smokes one and half pack per day, for 30 years. Does not drink alcohol.   He has remote history of cocaine use occasionally.  PHYSICAL EXAMINATION:  GENERAL:  A middle-aged Caucasian gentleman, currently not in distress. VITAL SIGNS:  Afebrile, temperature 98, pulse rate 62 per minute and regular, blood pressure 107/65, oxygen sats 98%, distal pulses well felt. HEENT:  Head is nontraumatic.  Hearing appears normal. NECK:  Supple.  There is no bruit. CARDIAC:  No murmur or gallop. LUNGS:  Clear to auscultation. NEUROLOGIC:  He is awake, alert.  He is oriented to time and place and person.  He has diminished attention, short-term memory, and recall. His speech appears fluent.  He can follow commands well.  Eye movements are full range without nystagmus.  Face is symmetric without weakness. Tongue is midline.  Motor system exam reveals symmetric upper and lower extremity strength without focal drift or weakness.  Deep tendon reflexes are 2+ symmetric.  Plantars are downgoing.  Touch and pinprick sensation are preserved.  Gait was not tested.  DATA REVIEWED:  CT scan of the head on admission on September 08, 2010, shows no significant abnormalities. MRI scan of the brain shows multiple small areas of infarction involving mainly the left posterior cerebral artery and middle cerebral artery, watershed, as well as bilateral supratentorial region which  are nonhemorrhagic.  Blood cultures x2 are negative.  CSF cultures are negative.  RPR is negative.  Hemoglobin A1c is elevated at 8.3%.  CSF-VDRL is negative. HIV antibody is nonreactive.  CSF shows 122 cells, 47% RBCs, likely traumatic.  CSF protein is 94 mg%, glucose is normal at 82 mg%.  IMPRESSION:  This is a 55 year old gentleman with multiple bicerebral small embolic infarcts, etiology unclear.  Certainly bacterial endocarditis is a possibility given recent sepsis and interatrial clot also needs to be excluded.  PLAN:  I would recommend checking transesophageal echocardiogram for cardiac source of  embolism as well as fasting lipid profile.  Continue Plavix for stroke prevention.  Aggressive treatment of diabetes with hemoglobin A1c, goal below 6.5 and hypertension with systolic blood pressure goal below 120.  Physical, occupational therapy consults.  The patient's carotid ultrasound has also shown 60-75% left ICA stenosis, though this could contribute to his left brain symptoms but cannot explain his right brain strokes.  However, the patient may need to consider elective revascularization of the left internal carotid artery in a few months time after he is recovered.  I would recommend checking a CT angiogram of the neck to evaluate this carotid stenosis further. If it is determined to be more than 60%, he may need elective carotid revascularization.  I will be happy to follow the patient in consultation.  Kindly call for questions.     Caidence Higashi P. Pearlean Brownie, MD     PPS/MEDQ  D:  09/15/2010  T:  09/16/2010  Job:  [id number]  Electronically Signed by Delia Heady MD on 09/20/2010 10:12:35 PM

## 2010-09-21 DIAGNOSIS — I63239 Cerebral infarction due to unspecified occlusion or stenosis of unspecified carotid arteries: Secondary | ICD-10-CM

## 2010-09-21 LAB — CBC
HCT: 32.8 % — ABNORMAL LOW (ref 39.0–52.0)
Hemoglobin: 10.7 g/dL — ABNORMAL LOW (ref 13.0–17.0)
MCH: 25.1 pg — ABNORMAL LOW (ref 26.0–34.0)
MCHC: 32.6 g/dL (ref 30.0–36.0)
MCV: 76.8 fL — ABNORMAL LOW (ref 78.0–100.0)
RBC: 4.27 MIL/uL (ref 4.22–5.81)

## 2010-09-21 LAB — BASIC METABOLIC PANEL
CO2: 23 mEq/L (ref 19–32)
Chloride: 100 mEq/L (ref 96–112)
Creatinine, Ser: 1.09 mg/dL (ref 0.4–1.5)
GFR calc Af Amer: >60 mL/min (ref >60)
Glucose, Bld: 181 mg/dL — ABNORMAL HIGH (ref 70–99)

## 2010-09-21 LAB — GLUCOSE, CAPILLARY
Glucose-Capillary: 170 mg/dL — ABNORMAL HIGH (ref 70–99)
Glucose-Capillary: 208 mg/dL — ABNORMAL HIGH (ref 70–99)
Glucose-Capillary: 253 mg/dL — ABNORMAL HIGH (ref 70–99)

## 2010-09-21 NOTE — Consult Note (Signed)
[name], [name]              ACCOUNT NO.:  [id number]  MEDICAL RECORD NO.:  [id number]           PATIENT TYPE:  I  LOCATION:  2501                         FACILITY:  [facility]  PHYSICIAN:  Di Kindle. Edilia Bo, M.D.DATE OF BIRTH:  13-Oct-1955  DATE OF CONSULTATION:  09/17/2010 DATE OF DISCHARGE:                                CONSULTATION   REASON FOR CONSULTATION:  Carotid disease with bilateral strokes by MRI.  HISTORY:  This is a pleasant 55 year old gentleman who was admitted on September 08, 2010 with an altered mental status.  He was admitted to the [facility].  He had been doing well, however, did not respond to phone calls and was found by someone at the hotel where he lives at approximately 3:00 p.m.  He was unconscious with some slightly jerking movements in his upper extremities with his eyes rolled back.  He was brought to the emergency department and has undergone extensive workup.  Initial CT scan of the head was negative. Workup, however, subsequently included a carotid duplex scan, a CT angio of the neck and MRI.  He has evidence of bilateral carotid disease and also possible vertebral artery disease with bilateral acute infarcts seen on the MR of the brain.  For this reason, vascular surgery was consulted.  History is obtained from the patient who is somewhat of a poor historian.  He states that he has been having paresthesias in both legs off and on for the last year.  In addition, he states that has had some left-sided weakness off and on for several months which occurs intermittently and involves both the upper extremity and lower extremity on the left side.  At the same time, he states he has had occasional right arm numbness, most recent episode was approximately 2 weeks ago. In addition, he has had some dizziness again off and on for the last 1-2 weeks.  He is unaware of any history of amaurosis fugax and he is unaware of  any previous history of documented stroke.  His past medical history is significant for insulin-dependent diabetes, hypertension, hyperlipidemia, obesity, COPD, history of atrial fibrillation in the past and apparently some history of mental retardation by his history.  He denies any history of previous myocardial infarction or history of congestive heart failure.  FAMILY HISTORY:  He states that his father had coronary disease in his 70s.  He is unaware of any of other history of premature cardiovascular disease.  SOCIAL HISTORY:  He is single.  He does not have any children.  He smokes a pack per day of cigarettes and has been smoking for 42 years. He states that he started smoking when he was 12 years all.  He does not drink alcohol on a regular basis.  REVIEW OF SYSTEMS:  GENERAL:  He has had no recent weight loss, weight gain, or problems with his appetite.  He believes that he is 5 feet tall, 208 pounds. CARDIOVASCULAR:  He does admit to some occasional sharp left-sided chest pain.  He does not describe any chest pressure.  He does admit to dyspnea on exertion and  also some orthopnea.  He has had no recent arrhythmias.  He does not admit to a significant claudication, although his activity is fairly limited.  He had no history of DVT that he is aware of. PULMONARY:  He has had no productive cough, bronchitis, asthma, or wheezing. GI:  He has had no history of peptic ulcer disease and no recent change in his bowel habits. GU:  He has had no dysuria or frequency. NEUROLOGIC:  He did have the loss of consciousness on admission.  He has had dizziness as described above.  He has had no headaches or seizures that he is aware of. MUSCULOSKELETAL:  He has had some arthritis in his knees. ENT:  He has had no recent change in his eyesight or change in hearing. HEMATOLOGIC:  He has had no bleeding problems or clotting disorder. INTEGUMENTARY:  He states that he has had some  superficial abrasions on his legs.  PHYSICAL EXAMINATION:  GENERAL:  This is a pleasant 55 year old gentleman who appears his stated age. VITAL SIGNS:  Temperature is 98.3, heart rate is 64, blood pressure 122/54, heart rate 100. HEENT:  Pupils were equal, round, and reactive to light and accommodation.  Extraocular motions are intact.  He is edentulous in many areas.  Conjunctivae are normal. LUNGS:  Clear bilaterally to auscultation without rales, rhonchi, or wheezing. CARDIOVASCULAR:  I do not detect carotid bruits.  He has a regular rate and rhythm.  He has palpable femoral pulses and palpable dorsalis pedis pulses bilaterally. ABDOMEN:  Obese, very difficult to assess.  He has a large pannus.  He has normal pitched bowel sounds. MUSCULOSKELETAL:  There are no major deformities or cyanosis. NEUROLOGIC:  I do not detect any focal weakness or paresthesias. SKIN:  He has some superficial abrasions on his legs. LYMPHATIC:  He has no significant cervical, axillary, or inguinal lymphadenopathy.  EKG shows a normal sinus rhythm.  Hemoglobin A1c is 8.3.  I reviewed his labs which show white count of 8.6, H&H 11 and 33, platelet 293,000.  I have reviewed his duplex scan of his carotids which by velocity criteria show no significant right carotid stenosis with evidence of 60-79% left carotid stenosis.  Both vertebral arteries are patent with normally directed flow.  He has also had a CT angio of the neck and this was interpreted by the radiologist as showing a 60-70% right internal carotid artery stenosis with a less than 60% left carotid stenosis.  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  I have also reviewed his MR of the brain which shows multiple areas of acute infarcts more extensively on the left side than on the right side.  In addition, I did review his 2-D echo which  showed normal EF with ejection fraction of 65- 70%.  His initial CT of the head on February 1 showed no acute abnormalities.  IMPRESSION:  This is a 55 year old gentleman who presents with change in mental status, although no focal symptoms are described on his presentation.  Upon history, he does describe some intermittent weakness which he has had on both sides.  MRI suggests bilateral acute infarcts, which could be related to watershed or embolic infarcts.  He is more extensive on the left side.  The carotid duplex scan and the CT angio findings do not agree.  Given his problems with dizziness and bilateral strokes, I think it  would be prudent to further evaluate his carotid and vertebral artery disease.  I think the best way to sort out the conflicting data would be to proceed with cerebral arteriography.  This will allow [place] to fully assess the carotids and also the vertebrals. Certainly his body habitus may make this more challenging but I think the information would be useful.  I have recommended we proceed with cerebral arteriography.  I have discussed the indications for the procedure and potential complications including not limited to bleeding, arterial injury, and stroke.  We can make further recommendations pending the results of his arteriogram.  I will try to arrange this for early next week.  In the meantime, he is on aspirin and his medical comorbidities are being closely monitored and worked on by the [facility].     Di Kindle. Edilia Bo, M.D.     CSD/MEDQ  D:  09/17/2010  T:  09/18/2010  Job:  [id number]  cc:   Paula Compton, MD Pramod P. Pearlean Brownie, MD  Electronically Signed by Waverly Ferrari M.D. on 09/21/2010 02:21:25 PM

## 2010-09-21 NOTE — Op Note (Signed)
[name], [name]              ACCOUNT NO.:  [id number]  MEDICAL RECORD NO.:  [id number]           PATIENT TYPE:  I  LOCATION:  2501                         FACILITY:  [facility]  PHYSICIAN:  Di Kindle. Edilia Bo, M.D.DATE OF BIRTH:  [birth year]  DATE OF PROCEDURE: DATE OF DISCHARGE:                              OPERATIVE REPORT   SURGEON:  Di Kindle. Edilia Bo, MD  INDICATIONS:  This is a 55 year old gentleman who was admitted and found to have bilateral acute infarcts by MRI.  He describes some intermittent weakness in both sides.  Carotid duplex scan had suggested a 60-79% left carotid stenosis with a less than 39% right carotid stenosis.  CT angiogram, however, had suggested a 60-79% right carotid stenosis with a less than 60% left carotid stenosis.  CT angio also suggested a proximal right vertebral stenosis with an occluded proximal left vertebral artery.  Given the conflicting date on duplex and CT angio, I have recommend cerebral arteriogram to further sort this out.  PROCEDUE: 1. ultrasound guided access to left CFA 2. Arch Aortogram 3. Selective Innominate arteriogram 4. Selective Right common carotid arteriogram 5. Selective Left common carotid arteriogram 6. Selective Left subclavian artery arteriogram 7. Retrograde Left femoral arteriogram 8. Perclose Left CFA TECHNIQUE:  The patient was taken to the [facility].  Given his body habitus, I elected to select the left groin.  Under ultrasound guidance after the skin was anesthetized and after the patient has been prepped and draped, the left common femoral artery was cannulated and a guidewire introduced into the infrarenal aorta under fluoroscopic control.  A 5-French sheath was introduced over the wire.  Pigtail catheter was positioned in the ascending aortic arch and arch aortogram obtained at a 40-degree LAO projection.  The pigtail catheter was exchanged for an H1 catheter which was positioned into the  innominate artery.  Selective innominate arteriogram was obtained with several projections in order to demonstrate the right vertebral artery adequately.  Next, the wire was advanced into the common carotid artery and the catheter advanced into the common carotid artery.  Selective right common carotid arteriogram was obtained.  Next, this catheter was exchanged for SIM-1 catheter, which was positioned into the left common carotid artery and selective left common carotid arteriogram obtained. I then exchanged this catheter for a Berenstein II catheter, which was positioned into the subclavian artery and selective left subclavian arteriogram was obtained.  Both intracranial and extracranial views were obtained.  The intracranial views will be interpreted separately by the neuroradiologist.  FINDINGS:  The aortic arch was widely patent with no significant arch disease.  The innominate artery, right common carotid artery, right subclavian, left common carotid artery and left subclavian arteries are all widely patent.  The right vertebral artery is widely patent.  There is no proximal stenosis noted.  The left vertebral artery is occluded. There is an approximately 30% stenosis of the internal carotid artery above the bifurcation with a slight kink in this area, but this is fairly smooth.  There is an approximately 40% stenosis in the proximal internal carotid artery on the left over a length of approximately 2  cm. This likewise is fairly smooth.  At the completion of the procedure, the 5-French sheath was exchanged for the Perclose device after left femoral arteriogram confirmed catheterization in the common femoral artery.  The Perclose was advanced over the wire and then the wire removed.  The wound catheter was advanced until there was good return from the marker port.  The foot was then deployed, the catheter retracted and the suture engaged.  The foot was then retracted, the  catheter retracted and then the suture tied using the knot pusher.  There was good hemostasis.  Pressure was held for 5 minutes.  The patient tolerated the procedure well and was neurologically intact.  He was transferred to the holding area.     Di Kindle. Edilia Bo, M.D.     CSD/MEDQ  D:  09/20/2010  T:  09/21/2010  Job:  [id number]  cc:   Pramod P. Pearlean Brownie, MD  Electronically Signed by Waverly Ferrari M.D. on 09/21/2010 02:24:11 PM

## 2010-09-22 ENCOUNTER — Ambulatory Visit: Payer: Self-pay | Admitting: Family Medicine

## 2010-09-22 LAB — BASIC METABOLIC PANEL
BUN: 15 mg/dL (ref 6–23)
Calcium: 9.2 mg/dL (ref 8.4–10.5)
GFR calc non Af Amer: 52 mL/min — ABNORMAL LOW (ref >60)
Glucose, Bld: 226 mg/dL — ABNORMAL HIGH (ref 70–99)
Sodium: 129 mEq/L — ABNORMAL LOW (ref 135–145)

## 2010-09-22 LAB — GLUCOSE, CAPILLARY: Glucose-Capillary: 197 mg/dL — ABNORMAL HIGH (ref 70–99)

## 2010-09-23 LAB — GLUCOSE, CAPILLARY: Glucose-Capillary: 199 mg/dL — ABNORMAL HIGH (ref 70–99)

## 2010-09-24 NOTE — Consult Note (Signed)
  [name], [name]              ACCOUNT NO.:  [id number]  MEDICAL RECORD NO.:  [id number]           PATIENT TYPE:  I  LOCATION:  2501                         FACILITY:  [facility]  PHYSICIAN:  Aleria Maheu K. Tirrell Buchberger, M.D.DATE OF BIRTH:  [birth year]  DATE OF CONSULTATION: DATE OF DISCHARGE:  09/23/2010                                CONSULTATION   EXAMINATION:  Intracranial interpretation of bilateral carotid arteriograms.  FINDINGS:  The right internal carotid artery in its petrous , cavernous and supraclinoidal segments is normal.  The right middle cerebral arteries opacified normally.  There is a suggestion of a  hypoplastic right A1 segment.  The subsequent capillary and the venous phases are within normal limits.  The left common carotid arteriogram demonstrates  the cervical- petrous junction to be normal.  The petrous ,cavernous and supraclinoid segments are normally opacified.  The left middle and left anterior cerebral arteries are opacified normally into the capillary and the venous phases.  Cross opacification via the anterior communicating artery of the right anterior cerebral artery distal to the A2 segment is seen.  One  limited   view of the right vertebral artery at the skull base demonstrates the  right vertebral basilar  junction to be grossly patent.  IMPRESSION:  No occlusions, dissections, aneurysms, or stenosis seen intracranially.          ______________________________ Grandville Silos Corliss Skains, M.D.     SKD/MEDQ  D:  09/23/2010  T:  09/24/2010  Job:  [id number]  Electronically Signed by Julieanne Cotton M.D. on 09/24/2010 01:18:30 PM

## 2010-09-28 ENCOUNTER — Ambulatory Visit (HOSPITAL_COMMUNITY)
Admission: RE | Admit: 2010-09-28 | Discharge: 2010-09-28 | Disposition: A | Discharge status: Home / Self Care | Payer: No Typology Code available for payment source | Source: Ambulatory Visit | Attending: Internal Medicine | Admitting: Internal Medicine

## 2010-09-28 ENCOUNTER — Other Ambulatory Visit: Payer: Self-pay | Admitting: Internal Medicine

## 2010-09-28 DIAGNOSIS — B999 Unspecified infectious disease: Secondary | ICD-10-CM

## 2010-09-28 DIAGNOSIS — Z452 Encounter for adjustment and management of vascular access device: Secondary | ICD-10-CM | POA: Insufficient documentation

## 2010-09-28 LAB — VIRUS CULTURE: Preliminary Culture: NEGATIVE

## 2010-09-28 NOTE — Discharge Summary (Signed)
[name], [name]              ACCOUNT NO.:  [id number]  MEDICAL RECORD NO.:  [id number]           PATIENT TYPE:  I  LOCATION:  2501                         FACILITY:  [facility]  PHYSICIAN:  Leighton Roach Allessandra Bernardi, M.D.DATE OF BIRTH:  1956-01-27  DATE OF ADMISSION:  09/08/2010 DATE OF DISCHARGE:                              DISCHARGE SUMMARY   DATE OF DISCHARGE:  Pending placement at skilled nursing facility.  DISCHARGE MEDICATIONS:  Pending.  DISCHARGE DIAGNOSES: 1. Presumed bacterial meningitis. 2. Bilateral cerebrovascular accident, left greater than right. 3. Intellectual disability. 4. Abdominal hernia, chronic. 5. Diabetes type 2. 6. Hyperlipidemia. 7. Atrial flutter/atrial fibrillation with rapid ventricular response. 8. Hypertension.  PERTINENT LAB ON DATE OF DICTATION:  The patient's sodium 129, potassium 5.3, creatinine 1.09, glucose 181.  Hemoglobin 10.7, hematocrit 32.8, white blood cell count 9.7, hemoglobin A1c 8.3.  Blood culture no growth today final.  CSF culture, no organisms seen in final.  Urine strep negative.  HIV negative.  HSV negative.  Viral hepatitis panel negative. Urine culture negative.  PERTINENT STUDIES AT DISCHARGE: 1. Carotid arteriogram performed on September 20, 2010 showed 30% right     internal carotid stenosis and 40% left internal carotid stenosis. 2. Transesophageal echo performed on September 16, 2010 showed no ASD or     PFO, ejection fraction of 60% to 65%, normal systolic function.  No     evidence of thrombus in the atrial cavity or appendage of the left     atrium. 3. 2-D echo to September 09, 2010.  Valve function is vigorous, LV size     and function are normal, EF 60% to70%, wall motion mostly     normal. 4. CT angiogram of the carotids performed on September 16, 2010 showed a     right internal carotid 60%-70% stenosis, left sternocoracoid less     than 60% stenosis, right vertebral high-grade stenosis, left     vertebra  occluded. 5. Carotid ultrasound performed on September 15, 2010, right carotid     normal left with 60% to 79% stenosis. 6. MRI of brain on September 15, 2010, multiple areas of  acute     infraction,  left greater than right, also in the left thalamus.     Some cortical borders old infarction along the left PCA territory. 7. Portable chest x-ray on September 20, 2010 shows the tip of the     right PICC is in the right atrium, it should be withdrawn 4.2 cm,     in no acute disease.  CONSULTS:  Cardiac, Critical Care Medicine, ID, Neurologic, Vascular Surgery, Social Work and Case Management.  BRIEF HOSPITAL COURSE:  The patient is a 55 year old male who was admitted with altered mental status, fevers, tachycardia on initial physical exa, he subsequently experienced acute respiratory failure and  was intubated. 1. Altered mental status.  In the setting of fever, elevated WBC the patient was admitted and started on broad coverage antibiotics for sepsis.      LP could not be     obtained until second hospital day at which point he had  already  received 30 hours of IV antibiotics.  The patient's LP was     suggestive of bacterial infection, although CSF cultures has been     negative.  He was therefore treated empirically for bacterial meningitis.  Initial treatment consisted of     gentamicin,     vancomycin and Bactrim.  For short course, he was also treated with     Flagyl IV.  His final antibiotic regimen included Rocephin     b.i.d. x2 weeks to be completed on September 23, 2010 and Bactrim     q.8 IV x3 weeks to be completed on September 30, 2010.  ID consult     was obtained and they agreed with plan for empiric bacterial     meningitis treatment and suggested the regimen.  They also suggested looking for a source of     infectious emboli in the heart and a transesophageal echo was     obtained which was as mentioned above.  The patient improved.     During his hospital stay, he was  initially in the ICU and was     transitioned from there to tele.  By date of discharge, he has been     afebrile for multiple days.  His altered mental status has returned     to baseline.  His white count has normalized. 2. Acute respiratory failure.  The patient suffered acute respiratory     failure of September 09, 2010 and was intubated.  Acute respiratory     failure is most likely secondary to the neuro insult he obtained     from the CVA and bacterial meningitis.  He was extubated     successfully on September 11, 2010 and did not require reintubation     during his hospital stay.  His respiratory status remained stable     throughout the rest of his admission. 3. Acute cerebrovascular accident.  The patient was found to have     acute CVA on MRI obtained on September 15, 2010.  It was initially     believed that his altered mental status was secondary to bacterial     meningitis.  A CT of his head obtained on date of admission was within normal limits.  MRI was     obtained to assess for possible meningitis related abscess.  No abscess was found, but subacute     CVA was identified as mentioned above.  The patient was started on     aspirin and Plavix.  Plavix was discontinued and he was     transitioned to Pradaxa 150 mg p.o. b.i.d. for secondary prevention     of stroke and also for his atrial fibrillation/atrial flutter.  As     mentioned above, the patient had no neurological deficits from the     event.  He is back to baseline during this admission.  He was     evaluated with carotid ultrasound and then CTA of the carotids and     finally carotid arteriogram to assess for carotid stenosis.  As     mentioned carotid arteriogram which was accepted as final and most     accurate show 30% stenosisin the right RCA, right internal carotid, and     40% in the left internal carotid.  Vascular Surgery consult was     obtained prior to carotid arteriogram and the recommendation is no      surgery for minimal stenosis.  The patient however will  be     continued on Pradaxa and aspirin. 4. History of atrial fibrillation/atrial flutter.  The patient has a     known history of atrial fibrillation/atrial flutter.  He was rate     controlled with metoprolol 25 mg b.i.d.  He did have one event of     atrial fibrillation with RVR while in the ICU.  He was started on a     Cardizem drip and transitioned to his home dose of diltiazem 240 XR     daily.  Since then, he does have occasional PVCs or PAC, but has     not had RVR. 5. Hypotension.  The patient did have an episode of hypotension.     While in the ICU he required phenylephrine drip, the drip was     discontinued on September 11, 2010. 6. Hypertension.  The patient's blood pressure was well controlled     throughout his hospital stay.  His history of hypertension was     controlled on his home dose metoprolol and diltiazem. 7. Tobacco use.  The patient was continued on nicotine patch 14 mg     p.o. daily. 8. Abdominal hernia with shallow ulcers.  The patient has a chronic     abdominal hernia.  On admission, he had shallow ulcers that were     red.  On exam, Wound Care consult was obtained and the ulcers were     cleaned and dressed and topical antibiotic cream was applied.  DISCHARGE INSTRUCTIONS:  The patient will be discharged to nursing home with instructions to continue on Bactrim IV until October 02, 2010 and Rocephin IV until September 13, 2010.  He is instructed to no limitations on activity.  He is instructed to consume carb-modified diet.  DISCHARGE CONDITION:  Currently, the patient is awaiting for placement. He is in stable medical condition.  There has been addendum to follow once the patient has placement which will include his discharge medications and any followup issues and recommendations.    ______________________________ Dessa Phi, MD   ______________________________ Leighton Roach Cynai Skeens,  M.D.    JF/MEDQ  D:  09/21/2010  T:  09/21/2010  Job:  [id number]  Electronically Signed by Dessa Phi MD on 09/22/2010 12:10:13 PM Electronically Signed by [name] on 09/28/2010 11:23:17 AM

## 2010-09-28 NOTE — Discharge Summary (Signed)
[name], [name]              ACCOUNT NO.:  [id number]  MEDICAL RECORD NO.:  [id number]           PATIENT TYPE:  I  LOCATION:  2501                         FACILITY:  [facility]  PHYSICIAN:  [name], Joseph OrozcoDATE OF BIRTH:  1956/02/15  DATE OF ADMISSION:  09/08/2010 DATE OF DISCHARGE:  09/22/2010                              DISCHARGE SUMMARY   ADDENDUM  PRIMARY CARE PHYSICIAN:  Dr. Angeline Slim at [facility].  This is addendum to the discharge medications.  MEDICATIONS: 1. Tylenol 650 mg p.o. q.6 as needed.  NEW MEDICATION: 1. Albuterol 2.5 mg/3 mL neb solution, 2.5 mg inhaled q.3 h. as needed     for shortness of breath. 2. Ceftriaxone 200 mg IV every 12 hours until September 23, 2010 at     2400. 3. Pradaxa 150 mg p.o. b.i.d. 4. NovoLog 1:15 units subcu t.i.d. before meals and at bedtime. 5. Lantus 28 units subcu daily at bedtime. 6. Atrovent 0.2 mg/mL nebs 0.5 mg inhaled twice daily. 7. Nicotine patch 21 mg per 24 hours transdermally. 8. Bactrim 320 mg 250 mL per hour IV every 8 hours, last dose September 10, 2010 at 2400.  HOME MEDICATIONS CONTINUED: 1. Aspirin 81 mg 1 tablet p.o. daily. 2. Diltiazem 240 mg caps extended release 1 caplet p.o. daily. 3. Gabapentin 300 mg 1 capsule p.o. daily. 4. Lipitor 40 mg 1 tablet p.o. daily. 5. Metoprolol tartrate 25 mg 1 tablet p.o. twice daily. 6. Metformin 1000 mg 1 tablet p.o. twice daily with meals. 7. Nitroglycerin sublingual 0.4 mg 1 tablet p.o. every 5 minutes as     needed up to 3 doses. 8. Paroxetine 40 mg 1 tablet p.o. daily. 9. Triamcinolone topical 0.1% ointment 1 application topically daily.  ADDENDUM TO THE HOSPITAL COURSE:  The patient discharged on September 22, 2010.  He is to go [facility].  On day of discharge, the patient is in no acute distress.  His heart and lung exams were within normal limits.  His heart exam, he is in sinus rhythm at a rate of 62 beats per  minute.  The lungs are clear to auscultation bilaterally. Exam of his extremities were nontender, there is no edema.  Examination of neuro, he is alert and oriented x3.  ADDENDUM TO LABORATORY DATA AT DISCHARGE:  Sodium 129, potassium 5.1, glucose 226, creatinine 1.42 up from 1.09 yesterday.  ADDENDUM TO FOLLOWUP ISSUES AND RECOMMENDATIONS: 1. Mild carotid stenosis.  The patient is to follow up with a carotid     duplex in 6 months per Vascular Surgery. 2. Hyponatremia and hyperkalemia.  The patient is to have followup     BMET weekly.  He is to continue with fluid restrictions of 1-1.5 L     of water daily and low potassium diet until Bactrim is     discontinued.  DISCHARGE INSTRUCTIONS:  The patient is discharged to SNF with with no limitations on activity, instructions to continue a carb-modified diet, to follow up with his primary care provider Dr. Angeline Slim at [facility] within the next  2-3 weeks.  DISCHARGE CONDITION:  The patient is discharged to skilled nursing facility in stable medical condition.    ______________________________ Joseph Phi, MD   ______________________________ [name], M.D.    JF/MEDQ  D:  09/22/2010  T:  09/22/2010  Job:  [id number]  cc:   Angeline Slim, MD  Electronically Signed by Joseph Phi MD on 09/22/2010 12:11:31 PM Electronically Signed by [name] on 09/28/2010 11:23:20 AM

## 2010-10-04 NOTE — H&P (Signed)
[name], EVETTS NO.:  [id number]  MEDICAL RECORD NO.:  [id number]           PATIENT TYPE:  E  LOCATION:  [facility]                         FACILITY:  [facility]  PHYSICIAN:  Paula Compton, MD        DATE OF BIRTH:  May 29, 1956  DATE OF ADMISSION:  09/08/2010 DATE OF DISCHARGE:                             HISTORY & PHYSICAL   PRIMARY CARE PHYSICIAN:  Cat Ta, MD, at [facility].  CHIEF COMPLAINT:  Altered mental status.  HISTORY OF PRESENT ILLNESS:  The history was obtained from the patient's sister, sister-in-law, and niece.  The patient was in his normal state of health this morning.  He was called around 11 a.m. and did not respond to a knock at his door.  He was found down at 3 p.m. by a worker at his motel where he lives with his sister-in-law.  They found the patient down on his side, slightly jerking his upper extremities and rolling his eyes back.  There was dry blood in his mouth, no loss of bowel or bladder.  EMS was called and he was brought in where he was found to be febrile and tachycardic.  The patient's sisters states that the patient has been out of some of his meds x1 week.  They are unsure about which meds he has not been taking.  They said that he has been taking his insulin as prescribed.  EMERGENCY DEPARTMENT COURSE:  The patient received Tylenol ER 1 g ER x1, vanc IV x1, and cefepime IV x1.  ALLERGIES:  The patient is allergic to PENICILLIN.  MEDICATIONS:  The patient is on multiple medications.  His list includes: 1. Lisinopril 10 daily. 2. Metformin 1000 b.i.d. 3. Paroxetine 40 daily. 4. Glipizide 5 b.i.d., 5. Klonopin 0.5 mg 2 times for anxiety. 6. Lipitor 40 daily, 7. Aspirin 81 mg daily. 8. Gabapentin 300 mg 1 tablet daily for pain. 9. Lantus 50 units in the morning. 10.Triamcinolone ointment, apply to both legs t.i.d. for rash. 11.Diltiazem 240 mg XR daily. 12.Metoprolol 25 mg half a tablet t.i.d. twice  daily. 13.Nitroglycerin sublingual p.r.n. chest pain.  PAST MEDICAL HISTORY:  The patient is full code.  He has history of cognitive impairment, tobacco dependence, history of anemia of iron deficiency, diabetes type 2, hypertension, anxiety, hyperlipidemia, AFib with CHADS score of 2 diagnosed in November 2010, history of lung nodule, history of chronic low back pain, history of noncompliance.  FAMILY HISTORY:  Three sisters, one brother who has stomach problems and hypertension.  He does not follow up with an MD often, four half brothers and sisters that died at age 105 with heart problems, mom died at age 7 with MI.  SOCIAL HISTORY:  The patient lives in a motel where his sister works. He is single with no kids.  He was recently released from prison in 2003 after serving 2 years for indecency with a minor.  He smokes cigarettes greater than 30 years, also smoked a pipe.  Denies EtOH and drugs. Family history and social history obtained from previous office notes.  REVIEW OF SYSTEMS:  Unable to obtain.  PHYSICAL EXAMINATION:  VITAL SIGNS:  Temperature 102.7, heart rate 118- 135, respiratory rate 22-33, O2 sats 100% on face mask, blood pressure 167/73. GENERAL:  He is an adult male, responsive to pain, nonverbal, not following commands, voluntary movements. HEAD:  Normocephalic, atraumatic. EYES:  PERRLA. MOUTH:  Oral mucosa and oropharynx without lesions or exudates. NECK:  No deformities, masses, or tenderness noted. LUNGS:  Increased respiratory rate, snoring, symmetrical chest motion. Lungs are clear to auscultation.  No crackles or wheezes. HEART:  Sinus tach.  No murmurs, rubs, clicks, or gallops. ABDOMEN:  Bowel sounds positive.  Abdomen is soft, questionably tender right-sided hernia, large, multiple shallow ulcers of various stages in abdomen overlying hernia. MUSCULOSKELETAL: Normal range of motion and joint tenderness and no joint swelling.  Pulse, right and left  radial and dorsalis pedis normal. EXTREMITIES: No clubbing, cyanosis, or edema.  Deformity noted.  Full range of motion in all joints. NEUROLOGIC:  GCS 11, E4, V2, M5. SKIN:  Warm and pink. PSYCHIATRIC:  Unable to obtain.  ADDITIONAL EXAM:  CBC, white blood cells 18.5, hemoglobin 13.6, hematocrit 40.7, platelets 330,000, MCV 75, neutrophils 87%.  CMET, 133, 1.9, 24, 22, 11, 0.94, glucose 396.  INR 0.98.  Lipase 50.  Lactic acid 4.9.  Urine drug screen is positive for benzos.  Cardiac enzymes negative x1 set.  UA greater than 1000 glucose, large hemoglobin, 40 ketones, otherwise negative.  Urine micro 11-20 red blood cells. Tylenol and aspirin levels within normal limits.  Procalcitonin less than 0.1.  EKG sinus tachycardia.  CT head and nonspecific hypodensity in the left thalamus, most commonly due to small-vessel ischemia, otherwise no acute intracranial abnormality, chronic maxillary sinusitis.  ASSESSMENT AND PLAN:  This is a 55 year old gentleman who presented in altered mental status. 1. Altered mental status concerning for seizures versus meningitis     versus sepsis.  CT of the head within normal limits, admit to ICU.      We will attempt to obtain CSF for analysis and culture.      We will have low threshold to intubate if the patient experiences respiratory distress.  2. Infectious disease.  The patient meets criteria for sepsis.  Fever,     elevated heart rate, elevated white blood cell count, source of     infection, possible cellulitis leading to sepsis, possible     meningitis.  We will continue antibiotics, cefepime, vanc, and     Flagyl.  Tylenol for fever.  Follow blood and urine culture.  CT of     the abdomen to rule out abdominal abscess.  LP to obtain CSF for     analysis and culture. 3. Hyperglycemia.  A1c was greater than 14, elevated secondary to     noncompliance and infection most likely.  We will start     Glucommander protocol. 4. Hypertension:  Hold the  antihypertensives for now out of concern     for possibly hypotension in the near future given the patient's     clinical presentation. 5. Fluids, electrolytes, nutrition, gastrointestinal.  N.p.o.  Follow     up a.m. CMET, IV fluids at 150, normal saline at 150 mL per hour. 6. DVT Prophylaxis.  Heparin 5000 subcu t.i.d. 7. Disposition.  Pending clinical improvement.    ______________________________ Dessa Phi, MD   ______________________________ Paula Compton, MD    JF/MEDQ  D:  09/09/2010  T:  09/09/2010  Job:  [id number]  cc:   Angeline Slim, MD  Electronically Signed by Dessa Phi MD on 09/22/2010 12:03:17 PM Electronically Signed by Paula Compton MD on 10/04/2010 09:47:32 AM

## 2010-10-07 HISTORY — PX: OTHER SURGICAL HISTORY: SHX169

## 2010-10-14 NOTE — Consult Note (Signed)
[name], [name] NO.:  [id number]  MEDICAL RECORD NO.:  [id number]           PATIENT TYPE:  LOCATION:                                 FACILITY:  PHYSICIAN:  Acey Lav, MD  DATE OF BIRTH:  January 28, 1956  DATE OF CONSULTATION: DATE OF DISCHARGE:                                CONSULTATION   REQUESTING PHYSICIAN:  Paula Compton, MD, with Family Medicine.  REASON FOR INFECTIOUS DISEASE CONSULTATION:  The patient with meningoencephalitis and sepsis.  HISTORY OF PRESENT ILLNESS:  [name] is a 55 year old Caucasian male with history of cognitive impairment, also diabetic with hyperlipidemia, who apparently had been called at 11 o'clock in the morning on the September 08, 2010, and did not respond.  He was ultimately found down at 3 o'clock by a worker in his motel.  He was lying on side and jerking his upper extremities and rolling his eyes back in his head. There was some dry blood in his mouth.  EMS brought him to the emergency department, he was febrile and tachycardic.  The patient was admitted to the [facility] Medicine Service in the intensive care unit.  Blood cultures were drawn.  He was given vancomycin and cefepime and admitted to intensive care unit.  He was initially treated with vancomycin, cefepime, and Flagyl for more than 2 days that he was here.  He underwent a lumbar puncture yesterday, and the lumbar puncture after more than 24 hours of antibiotics showed 122 white blood cells with 95% segmented neutrophils on tube #1, similar profile on tube #4 with a protein of 94 and a glucose of 82.  The patient came in with parameters consistent with septic shock with a high white count, lactic acidosis, and a rising procalcitonin during the course of his stay in the intensive care unit.  After his spinal fluid analysis returned, his antibiotics were changed to vancomycin, ceftriaxone, and high-dose trimethoprim and sulfamethoxazole to cover him for  Listeria.  His course has been rocky, he has been hypotensive and required Neo-Synephrine up to 40 per hour but now has been titrated down to 10.  Cognitively, he appears to be improving and responding well to current therapy.  We are consulted to assist in the management of this patient with bacterial meningoencephalitis.  PAST MEDICAL HISTORY: 1. Cognitive delay. 2. Hypertension. 3. Hyperlipidemia. 4. Diabetes. 5. Atrial fibrillation. 6. History of prior lung nodule of unknown significance.  FAMILY HISTORY:  Three sisters and brother-in-law had stomach problems, hypertension.  SOCIAL HISTORY:  The patient lives in a motel where his sister works. He is single and no children.  He was recently released from prison in 2003, after he was sentenced for 2 years for indecency with a minor.  He smoked cigarettes, greater than 30 years, also smoked pipe.  Denies alcohol or other recreational drugs.  REVIEW OF SYSTEMS:  Unobtainable from the patient currently.  ALLERGIES:  PENICILLIN.  MEDICATIONS:  Vancomycin, Rocephin, Bactrim, Flagyl, Lopressor, Ventolin, Atrovent, aspirin, Protonix, sodium chloride, Lantus, NovoLog.  PHYSICAL EXAMINATION:  VITAL SIGNS:  Temperature maximum in the last 24 hours is 100.6, temperature current  is 98.5; blood pressure is 106/57 on phenylephrine 10 mcg/minute.  His heart rate is 70, in AFib.  He is satting 100% on 30% FiO2. GENERAL:  The patient is intubated but responds to commands, tries to answer questions despite being on the ventilator.  He is balding, I found no skull deformities. NECK:  Thick.  Central line in place. CARDIOVASCULAR:  Tachycardic, irregular rhythm. LUNGS:  Clear to auscultation. ABDOMEN:  Soft, nondistended abdomen. EXTREMITIES:  Without edema.  He has some what appeared to be chronic changes of the skin in the upper extremities and some excoriation of the lower extremity shins.  LABORATORY DATA AND RADIOGRAPHY:  Chest  x-ray showed endotracheal tube in place. CT of the abdomen and pelvis done on September 09, 2010, showed fatty liver with trace pleural effusion, atherosclerosis with coronary artery disease, large wide-mouth ventral hernia. CT of the head on September 08, 2010, showed nonspecific hypodensity in the thalamus, also ischemia, chronic right maxillary sinus thickening.  Current labs:  Vancomycin trough 22, procalcitonin now 0.45 and was less than 0.1 initially.  Metabolic panel; sodium 138, potassium 3.4, chloride 111, bicarb 21, BUN and creatinine 18 and 0.88, glucose 211. CBC differential; white count 16, hemoglobin 11, platelets 279, ANC of 11.6.  Comprehensive metabolic panel showed slightly elevated AST of 39, ALT of 16, bilirubin 0.6, albumin 2.4. Cardiac markers; CK of 1289, MB of 4.3, troponin 0.22.  MRSA PCR screen was negative on antibiotics.  Urinalysis is negative. CSF cell count 122 white cells with 47 red cells, 95% segmented neutrophils.  Protein and glucose were 82 and 94 respectively.  Microbiological data:  Spinal fluid on antibiotics on September 09, 2010, no growth to date.  BAL on September 09, 2010, Gram-positive rods seen but these were nonpathogenic oropharyngeal flora apparently.  Blood cultures on September 08, 2010, there have been no growth to date.  IMPRESSION AND RECOMMENDATIONS:  This is a 55 year old gentleman with florid meningoencephalitis and septic shock: 1. Meningoencephalitis and septic shock:  Certainly, most likely     culprits here would be pneumococcal meningitis and sepsis, less     likely would be Haemophilus influenzae and Listeria.  He has     improved on broad-spectrum coverage for pneumococcus, Listeria, H     flu, getting better on current antibiotics.  I would stop his     Flagyl as I see no role for it here but I would continue him on     high-dose Rocephin dose twice daily, vancomycin, and Bactrim.  I     will check a pneumococcal antigen in the  urine but unless we grow     an organism, we are going ahead with the plan of  just giving him     empiric therapy and along this line, he is going to need at least 2     weeks of high-dose Rocephin for pneumococcal meningitis and a 3-     week course of high-dose Bactrim for listeriosis.  I think Listeria     is less likely as clinical presentation does seem more consistent     with fulminant pneumococcal sepsis but in the absence of proof, we     may have to just treat him empirically for all of these entities.     Certainly,     he is improving dramatically. 2. Screening.  I will screen him for human immunodeficiency virus and     for viral hepatitis.  Thank you for the infectious disease  consultation.     Acey Lav, MD     CV/MEDQ  D:  09/10/2010  T:  09/11/2010  Job:  [id number]  cc:   Kalman Shan, MD Paula Compton, MD  Electronically Signed by Paulette Blanch DAM MD on 10/13/2010 11:27:43 AM

## 2010-10-22 ENCOUNTER — Ambulatory Visit (INDEPENDENT_AMBULATORY_CARE_PROVIDER_SITE_OTHER): Payer: No Typology Code available for payment source | Admitting: Family Medicine

## 2010-10-22 ENCOUNTER — Encounter: Payer: Self-pay | Admitting: Family Medicine

## 2010-10-22 VITALS — BP 118/64 | Temp 98.3°F | Ht 60.0 in | Wt 211.0 lb

## 2010-10-22 DIAGNOSIS — F17200 Nicotine dependence, unspecified, uncomplicated: Secondary | ICD-10-CM

## 2010-10-22 DIAGNOSIS — I4891 Unspecified atrial fibrillation: Secondary | ICD-10-CM

## 2010-10-22 DIAGNOSIS — E119 Type 2 diabetes mellitus without complications: Secondary | ICD-10-CM

## 2010-10-22 DIAGNOSIS — Z09 Encounter for follow-up examination after completed treatment for conditions other than malignant neoplasm: Secondary | ICD-10-CM

## 2010-10-22 DIAGNOSIS — I6350 Cerebral infarction due to unspecified occlusion or stenosis of unspecified cerebral artery: Secondary | ICD-10-CM

## 2010-10-22 DIAGNOSIS — I639 Cerebral infarction, unspecified: Secondary | ICD-10-CM

## 2010-10-22 MED ORDER — PAROXETINE HCL 40 MG PO TABS: 40.0000 mg | ORAL_TABLET | Freq: Every day | ORAL | Status: DC

## 2010-10-22 MED ORDER — GLUCOSE BLOOD VI STRP: ORAL_STRIP | Status: DC

## 2010-10-22 MED ORDER — DILTIAZEM HCL ER BEADS 240 MG PO CP24: 240.0000 mg | ORAL_CAPSULE | Freq: Every day | ORAL | Status: DC

## 2010-10-22 MED ORDER — ATORVASTATIN CALCIUM 40 MG PO TABS: 40.0000 mg | ORAL_TABLET | Freq: Every day | ORAL | Status: DC

## 2010-10-22 MED ORDER — INSULIN PEN NEEDLE 31G X 5 MM MISC: 28.0000 [IU] | Freq: Every day | Status: DC

## 2010-10-22 MED ORDER — DABIGATRAN ETEXILATE MESYLATE 150 MG PO CAPS: 150.0000 mg | ORAL_CAPSULE | Freq: Two times a day (BID) | ORAL | Status: DC

## 2010-10-22 MED ORDER — METOPROLOL TARTRATE 25 MG PO TABS: 25.0000 mg | ORAL_TABLET | Freq: Two times a day (BID) | ORAL | Status: DC

## 2010-10-22 MED ORDER — METFORMIN HCL 1000 MG PO TABS: 1000.0000 mg | ORAL_TABLET | Freq: Two times a day (BID) | ORAL | Status: DC

## 2010-10-22 MED ORDER — FUROSEMIDE 40 MG PO TABS: 40.0000 mg | ORAL_TABLET | Freq: Every day | ORAL | Status: DC

## 2010-10-22 MED ORDER — NICOTINE 21 MG/24HR TD PT24: 1.0000 | MEDICATED_PATCH | TRANSDERMAL | Status: DC

## 2010-10-22 MED ORDER — GABAPENTIN 300 MG PO CAPS: 300.0000 mg | ORAL_CAPSULE | Freq: Every day | ORAL | Status: DC

## 2010-10-22 NOTE — Assessment & Plan Note (Signed)
He has not smoked since discharge from hospital. Renee Rival!!!  He is using Nicotine 21mg  patch.

## 2010-10-24 ENCOUNTER — Encounter: Payer: Self-pay | Admitting: Family Medicine

## 2010-10-24 DIAGNOSIS — I639 Cerebral infarction, unspecified: Secondary | ICD-10-CM | POA: Insufficient documentation

## 2010-10-24 NOTE — Assessment & Plan Note (Signed)
Rate control with metoprolol 25mg  bid.  He is taking Pradaxa 150mg  bid.  Will cont to monitor.

## 2010-10-24 NOTE — Assessment & Plan Note (Signed)
CVA 09/2010 --> found down.  Pt has recovered all functions and is at baseline.  Will cont on ASA and Pradaxa.

## 2010-10-24 NOTE — Assessment & Plan Note (Signed)
Pt doing well since d/c to snf from hosp and now home from snf.

## 2010-10-24 NOTE — Progress Notes (Signed)
  Subjective:    Patient ID: [name], male    DOB: [birth year], 55 y.o.   MRN: [id number]  HPI  CVA 09/2010: Pt was found down in the motel [place] he lives.  He was septic and meningitis vs abd source were concerns for cause of unresponsiveness.  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    He was discharged to SNF from hosp for rehab.  Since then pt has been discharged from SNF to home.  He has a sister and niece who are great at trying to take care of him.  Usually his sister takes him to appts, but today his niece accompanies him.  He has quit smoking, and is on the Nicotine 21mg  patch.  We congratulated him on this.    DIABETES: Meds: Lantus 28 units in AM.  Metformin 1000mg  two times a day, Glipizide 5mg  two times a day.  Compliance: compliance has been good since pt was discharged from hospital to SNF.  Noncompliance in the past, likley due to poor insight.  Diet: does not comply with ADA diet.  Drinks sodas (Cokes).  Lightheadedness:no    Dizziness :no   Confusion:no    Shakiness: no   Abd  pain: no   Nausea:no     Vomiting:no      CBGs:  200s.  No episodes of hypoglycemia.   Afib new onset 06/2009:  Taking Diltiazem and metoprolol for rate/rhythm control. No coumadin, CHADS score 2 (DM, HTN).  Now on Pradaxa 150mg  BID. No new events since onset No chest pain, palpitations, syncope, dyspnea    Review of Systems     Objective:   Physical Exam General:  Well-developed,well-nourished,in no acute distress; alert,appropriate and cooperative throughout examination. Vitals reviewed.   HEENT:  [place]/AT, EOMI, MMM, no neck mass or nodes.  Neck full ROM. Lungs:  Normal respiratory effort, chest expands symmetrically. Lungs are clear to auscultation, no crackles or wheezes. Heart:  Normal rate and regular rhythm. S1 and S2 normal without gallop, murmur, click, rub or other extra sounds. Abd:  Large ventral hernia and some sores on abd.   Extremities:  No  clubbing, cyanosis, edema, or deformity noted with normal full range of motion of all joints.  Neuro:  Nonfocal.        Assessment & Plan:

## 2010-10-24 NOTE — Assessment & Plan Note (Signed)
CBG log that he brought is showed elevated CBGs in the 200s.  Will not make changes at this time since pt just returned home from SNF.  Will monitor diet and CBGs now that he is home and pt to f'/u in 1 month.  I may make adjustment to Lantus at that time.  Will cont on Lantus 28 units for now, metformin 1000mg  bid.

## 2010-11-05 ENCOUNTER — Telehealth: Payer: Self-pay | Admitting: Family Medicine

## 2010-11-05 NOTE — Telephone Encounter (Signed)
Forward to Ta

## 2010-11-05 NOTE — Telephone Encounter (Signed)
States that Paxil is too expensive and needs something cheaper [facility]

## 2010-11-08 LAB — BASIC METABOLIC PANEL
BUN: 8 mg/dL (ref 6–23)
Creatinine, Ser: 0.63 mg/dL (ref 0.4–1.5)
GFR calc non Af Amer: >60 mL/min (ref >60)
Glucose, Bld: 304 mg/dL — ABNORMAL HIGH (ref 70–99)

## 2010-11-08 LAB — D-DIMER, QUANTITATIVE: D-Dimer, Quant: 0.82 ug/mL-FEU — ABNORMAL HIGH (ref 0.00–0.48)

## 2010-11-08 LAB — GLUCOSE, CAPILLARY: Glucose-Capillary: 341 mg/dL — ABNORMAL HIGH (ref 70–99)

## 2010-11-08 NOTE — Telephone Encounter (Signed)
Called Brandy from [facility] back.  Medicare states that I can call for PA for Pradaxa at [phone].  ID# [id number].  Was told to call [phone] to get PA.  When I called that number, I was told to call [phone].  Called that number and gave info for PA.  I will find out in 24 hours if it is approved. Will need to call [phone] to find out, and they will also call [facility] with that info.

## 2010-11-08 NOTE — Telephone Encounter (Signed)
Called back Saranap, pt's sister.  Per Bonita Quin his Medicaid info is:  Medicaid:   Recipient I.D. [id number]  [phone].  Called this number and was told I need to call the Prior Authorization dept at [phone]. Called that number.  Was told that Pradaxa was a non-preferred product.  Preferred product in Coumadin/Warfarin.  Discussed Dx of Atrial Fibrillation and necessity of this med and compliance issue.  Was able to get it approved through [facility].   According to Bonita Quin, his Medicare # [id number]  [facility], they state that pt gets his meds through Medicare, not Medicaid.  [facility] advised me to call Medicare [phone].  Called Medicare and was told that pt is not a member of [employer].  Called [facility] and they insist that the number they gave me is correct and that there was nothing else for them to do.  [facility] insist that they have billed all of pt's meds through this number.   [employer] suggested I call 1-800-Medicare.  Called 1-800-Medicare and they were not able to pull up the pt file under his SSN.  I tried using the Medicare number that his sister gave me.  Medicare told me that if his medication is rejected, the pharmacy has to call  [facility] [phone].   [facility]ed [facility] back that informed them that they need to call [facility] if the medication was rejected. Also informed [facility] again that the medication was approved by Medicaid this morning.  [facility] will not file it with Medicaid because pt has Medicare.  I spoke with Brandy at [facility] and insist that she call [facility], per Medicare's direction.  Gave her [facility] phone #.

## 2010-11-09 NOTE — Miscellaneous (Signed)
  Clinical Lists Changes Medical Records Request Records went to [facility] Date sent: 08/17/2009 [facility]  August 13, 2010 4:06 PM

## 2010-11-10 LAB — GLUCOSE, CAPILLARY
Glucose-Capillary: 211 mg/dL — ABNORMAL HIGH (ref 70–99)
Glucose-Capillary: 223 mg/dL — ABNORMAL HIGH (ref 70–99)
Glucose-Capillary: 236 mg/dL — ABNORMAL HIGH (ref 70–99)

## 2010-11-10 LAB — DIFFERENTIAL
Basophils Absolute: 0 10*3/uL (ref 0.0–0.1)
Basophils Relative: 0 % (ref 0–1)
Lymphocytes Relative: 15 % (ref 12–46)
Monocytes Absolute: 0.6 10*3/uL (ref 0.1–1.0)
Neutro Abs: 7.2 10*3/uL (ref 1.7–7.7)
Neutrophils Relative %: 69 % (ref 43–77)

## 2010-11-10 LAB — CBC
HCT: 36.2 % — ABNORMAL LOW (ref 39.0–52.0)
HCT: 40 % (ref 39.0–52.0)
MCHC: 33.6 g/dL (ref 30.0–36.0)
Platelets: 222 10*3/uL (ref 150–400)
Platelets: 225 10*3/uL (ref 150–400)
RDW: 14.7 % (ref 11.5–15.5)
RDW: 15 % (ref 11.5–15.5)
WBC: 10.8 10*3/uL — ABNORMAL HIGH (ref 4.0–10.5)

## 2010-11-10 LAB — T4, FREE: Free T4: 0.85 ng/dL (ref 0.80–1.80)

## 2010-11-10 LAB — COMPREHENSIVE METABOLIC PANEL
AST: 19 U/L (ref 0–37)
Albumin: 3.1 g/dL — ABNORMAL LOW (ref 3.5–5.2)
Albumin: 3.4 g/dL — ABNORMAL LOW (ref 3.5–5.2)
Alkaline Phosphatase: 58 U/L (ref 39–117)
Alkaline Phosphatase: 69 U/L (ref 39–117)
BUN: 12 mg/dL (ref 6–23)
BUN: 8 mg/dL (ref 6–23)
Chloride: 103 mEq/L (ref 96–112)
Creatinine, Ser: 0.67 mg/dL (ref 0.4–1.5)
Creatinine, Ser: 0.69 mg/dL (ref 0.4–1.5)
GFR calc Af Amer: >60 mL/min (ref >60)
GFR calc non Af Amer: >60 mL/min (ref >60)
Glucose, Bld: 190 mg/dL — ABNORMAL HIGH (ref 70–99)
Potassium: 3.7 mEq/L (ref 3.5–5.1)
Potassium: 4.1 mEq/L (ref 3.5–5.1)
Total Bilirubin: 0.2 mg/dL — ABNORMAL LOW (ref 0.3–1.2)
Total Protein: 6.6 g/dL (ref 6.0–8.3)

## 2010-11-10 LAB — PROTIME-INR
INR: 0.97 (ref 0.00–1.49)
Prothrombin Time: 12.8 seconds (ref 11.6–15.2)

## 2010-11-10 LAB — CARDIAC PANEL(CRET KIN+CKTOT+MB+TROPI)
Relative Index: 0.8 (ref 0.0–2.5)
Relative Index: 5.2 — ABNORMAL HIGH (ref 0.0–2.5)
Total CK: 131 U/L (ref 7–232)
Total CK: 200 U/L (ref 7–232)
Troponin I: 0.04 ng/mL (ref 0.00–0.06)
Troponin I: 0.06 ng/mL (ref 0.00–0.06)

## 2010-11-10 LAB — CK TOTAL AND CKMB (NOT AT ARMC): Relative Index: 1 (ref 0.0–2.5)

## 2010-11-10 LAB — TSH: TSH: 1.857 u[IU]/mL (ref 0.350–4.500)

## 2010-11-10 LAB — TROPONIN I: Troponin I: 0.01 ng/mL (ref 0.00–0.06)

## 2010-11-10 LAB — APTT: aPTT: 27 seconds (ref 24–37)

## 2010-11-10 LAB — D-DIMER, QUANTITATIVE: D-Dimer, Quant: 0.71 ug/mL-FEU — ABNORMAL HIGH (ref 0.00–0.48)

## 2010-11-10 NOTE — Telephone Encounter (Signed)
Called Linda back.  Told her Pradaxa has been approved for $3.  Pt has been out of this for about 1 wk.  Bonita Quin states she will pick up the med today.

## 2010-11-10 NOTE — Telephone Encounter (Signed)
[phone].  Spoke with pharmacist and asked her to resend the Pradaxa through Medicare.  She did and it was approved, $3 per month.

## 2010-11-10 NOTE — Telephone Encounter (Signed)
Called Linda back.  She states that pharmacy tried to call her, but she has not called them back.  She is not sure if Pradaxa has been approved or not.  Advised her to call pharmacy.  She agreed.

## 2010-11-24 ENCOUNTER — Encounter: Payer: Self-pay | Admitting: Family Medicine

## 2010-11-24 ENCOUNTER — Ambulatory Visit (INDEPENDENT_AMBULATORY_CARE_PROVIDER_SITE_OTHER): Payer: No Typology Code available for payment source | Admitting: Family Medicine

## 2010-11-24 VITALS — BP 134/69 | HR 64 | Temp 97.6°F | Wt 218.0 lb

## 2010-11-24 DIAGNOSIS — F17200 Nicotine dependence, unspecified, uncomplicated: Secondary | ICD-10-CM

## 2010-11-24 DIAGNOSIS — E119 Type 2 diabetes mellitus without complications: Secondary | ICD-10-CM

## 2010-11-24 DIAGNOSIS — J984 Other disorders of lung: Secondary | ICD-10-CM

## 2010-11-24 DIAGNOSIS — F411 Generalized anxiety disorder: Secondary | ICD-10-CM

## 2010-11-24 DIAGNOSIS — R911 Solitary pulmonary nodule: Secondary | ICD-10-CM

## 2010-11-24 LAB — BASIC METABOLIC PANEL
Calcium: 10.3 mg/dL (ref 8.4–10.5)
Chloride: 92 mEq/L — ABNORMAL LOW (ref 96–112)
Creat: 0.94 mg/dL (ref 0.40–1.50)

## 2010-11-24 MED ORDER — CLONAZEPAM 0.5 MG PO TABS: 0.5000 mg | ORAL_TABLET | Freq: Every evening | ORAL | Status: DC | PRN

## 2010-11-24 NOTE — Assessment & Plan Note (Signed)
Nodule seen on CT 2010 and with +tobacco history we are concerned about malignant process.  Will get another CT.

## 2010-11-24 NOTE — Assessment & Plan Note (Signed)
Pt has not smoked since 09/2010. Medicare charged a lot for Nicotine patch so he quit on his own.

## 2010-11-24 NOTE — Assessment & Plan Note (Signed)
Pt was taking Klonopin 0.5mg  bid but has been off of this for awhile.  His sister said that he is not sleeping well at night and is scratching himself.  Will Rx Klonopin 0.5mg  qhs.

## 2010-11-24 NOTE — Progress Notes (Signed)
  Subjective:    Patient ID: [name], male    DOB: [birth year], 55 y.o.   MRN: [id number]  HPI  Diabetes: Meds: Lantus 28 units, Metformin 1000mg  bid Compliance: yes Lightheadedness: no   Fatigue: no  Abd pain: no  Chest pain: no   Dypsnea: no   Diaphoresis: no  Syncope: no CBGs: has not been able to check because he could not afford strips.  When he was able to get strips, he did not know how to use the meter.    Review of Systems    per hpi  Objective:   Physical Exam  Constitutional: He is oriented to person, place, and time. He appears well-developed and well-nourished.  Cardiovascular: Normal rate and regular rhythm.   Pulmonary/Chest: Effort normal and breath sounds normal. No respiratory distress. He has no wheezes.  Neurological: He is alert and oriented to person, place, and time.          Assessment & Plan:

## 2010-11-24 NOTE — Patient Instructions (Signed)
Follow up in 1 month for diabetes. Please check your sugar every morning. You've done great with not smoking!!!  Congrats!

## 2010-11-24 NOTE — Assessment & Plan Note (Signed)
Pt has not been able to check CBGs because strips were too expensive.  He will continue on Lantus 28 units, metformin 1000mg  bid.

## 2010-11-26 ENCOUNTER — Ambulatory Visit
Admission: RE | Admit: 2010-11-26 | Discharge: 2010-11-26 | Disposition: A | Discharge status: Home / Self Care | Payer: No Typology Code available for payment source | Source: Ambulatory Visit | Attending: Family Medicine | Admitting: Family Medicine

## 2010-11-26 DIAGNOSIS — R911 Solitary pulmonary nodule: Secondary | ICD-10-CM

## 2010-11-26 MED ORDER — IOHEXOL 300 MG/ML  SOLN
75.0000 mL | Freq: Once | INTRAMUSCULAR | Status: AC | PRN
  Administered 2010-11-26: 75 mL via INTRAVENOUS

## 2010-11-29 ENCOUNTER — Encounter: Payer: Self-pay | Admitting: Family Medicine

## 2010-12-21 NOTE — H&P (Signed)
[name], [name]              ACCOUNT NO.:  [id number]   MEDICAL RECORD NO.:  [id number]          PATIENT TYPE:  INP   LOCATION:  4735                         FACILITY:  [facility]   PHYSICIAN:  [name], M.D.    DATE OF BIRTH:  May 11, 1956   DATE OF ADMISSION:  08/15/2007  DATE OF DISCHARGE:                              HISTORY & PHYSICAL   PRIMARY CARE PHYSICIAN:  [name], M.D.   CHIEF COMPLAINT:  Chest pain.   HISTORY OF PRESENT ILLNESS:  This is a 55 year old patient of Dr.  Lazaro [name] who presents with history of diabetes, hypertension, COPD,  mental retardation, anxiety, tobacco dependence, and hyperlipidemia, who  presents with a several-day history of chest pain.  The patient states  he has had constant chest pain since this morning.  Previously, he had  episodes of chest pain that came and went.  Chest pain is characterized  as sharp and radiating from right chest to left chest and back.  Chest  pain is minimally associated with nausea and some diaphoresis.  The  patient states that the chest pain is throughout the chest, not  associated with food, but it is worsened with exertion and movement.  Chest pain was slightly improved upon administration of Nitro and the  patient states it is improved by sitting down and resting.  The patient  states he has had similar chest pain in the past, but it was never  constant like it has been today.  The patient states that even during  interview, he still has small residual of chest pain present.  The  patient is a poor historian secondary to mild mental retardation and his  sister is in room.  Echo in 2005 showed ejection fraction of 55% to 65%  and normal left ventricular function.   PAST MEDICAL HISTORY:  1. Mental retardation.  2. Tobacco dependence.  3. History of iron deficiency anemia.  4. Type 2 diabetes.  5. Hypertension.  6. Anxiety.  7. Dyslipidemia.   ALLERGIES:  PENICILLIN.   MEDICATIONS:  1. Lisinopril  10 mg 1 p.o. daily.  2. Metformin 850 mg 1 p.o. b.i.d.  3. Paroxetine 20 mg 1 p.o. daily  4. Glipizide 5 mg 1 p.o. daily.  5. Klonopin 0.5 mg 1 p.o. b.i.d. p.r.n. anxiety.  6. Lovastatin 10 mg 1 p.o. daily.   FAMILY HISTORY:  The patient has 3 sisters and 1 brother who has stomach  problems and hypertension.  The patient has a father who died at age 16  from heart problems and a mother who died at age 40 from an MI.   SOCIAL HISTORY:  The patient is MR, he lives alone, single, no kids.  Niece has moved to live with him who is now getting him to appointments.  The patient has a history of being imprisoned in 2003, after serving 2  years for indecency with a minor.  The patient states he smokes 2-1/2  packs per day and is trying to quit.  The patient also smokes pipe.  The  patient denies alcohol and drugs stating they do nothing  for him.   PHYSICAL EXAMINATION:  VITAL SIGNS:  Temperature 96.8, pulse 57,  respiratory rate 20, and blood pressure 145/91.  O2 sats are 100% on  room air.  GENERAL:  The patient is in no acute distress, alert and appropriate and  cooperative throughout exam.  Mild mental retardation and talkative  nature.  HEENT:  Normocephalic and atraumatic.  Pupils are equally round and  reactive to light.  Extraocular movements intact.  Moist mucous  membranes.  No pharyngeal erythema, edema, or exudates.  LUNGS:  Normal respiratory effort.  Clear to auscultation bilaterally,  although decreased breath sounds throughout; untrue if whether secondary  to decreased effort or COPD.  HEART:  Normal S1 and S2.  No murmurs, rubs, or gallops.  ABDOMEN:  Bowel sounds are positive.  Abdomen is soft, nontender,  nondistended without organomegaly noted, but there is a large ventral  hernia noted over the right lower quadrant, nontender to palpation with  some crepitus.  EXTREMITIES:  No clubbing, cyanosis, or edema.  SKIN:  Bilateral upper and lower extremities with multiple  excoriations  from scratching.   LABORATORY DATA:  I-stat with a pH of 7.4, PCO2 of 41.7, bicarbonate 26,  H&H 13/39.  D-dimer is 0.63.  Sodium 140, potassium 4.3, chloride 107,  glucose 137, and BUN 7.  Cardiac enzymes negative point of care.  EKG,  no ischemic changes.   ASSESSMENT:  This is a 55 year old patient who presents with chest pain  and has multiple risk factors for cardiac etiology and coronary artery  disease.  1. Chest pain.  Difficult to elicit if typical or atypical given      description made by a poor historian.  The patient has a several      significant risk factors for CAD including high LDL, low HDL,      tobacco usage, diabetes, and hypertension.  We will admit him and      enzymes currently is negative x1.  EKG with no concerning signs of      ischemia.  We will get another one in the morning.  Likely, we will      set a patient with outpatient cardiac stress followup.  __________      greater than 30%, so we will start him on aspirin 81 mg.  Also      Nitro sublingual p.r.n.  Fasting lipid panel on November 2008 with      LDL 143 and HDL 28.  Increase D-dimer, but clinical picture not      consistent with pulmonary embolism as vital signs are stable, and      the patient has other reasons for elevated D-dimer, so we will not      do a helical CT.  2. Hypertension.  We will hold lisinopril, but we will start a low      dose of beta-blocker and metoprolol 12.5 mg b.i.d. given relative      bradycardia.  3. Diabetes.  We will hold lisinopril and metformin in case we decide      to pursue inpatient cardiac workup.  Moderate sliding scale      insulin, continue glipizide, check CBGs.  Hemoglobin A1c of 10.7 in      November 2008.  4. Anxiety.  Continue medications.  5. Hypercholesterolemia.  The patient was previously on Lovastatin      with elevated LDL at 143.  We will change this to Zocor 40 mg one      p.o.  daily and possible titration of it for better  control of      hyperlipidemia and diabetic with a goal of LDL less than 100.  6. Tobacco dependence.  Smoking cessation.  7. Disposition.  A 24-hour observation, rule out chest pain, determine      whether cardiac workup will be pursued inpatient versus outpatient.      Eustaquio Boyden, MD  Electronically Signed      Arnette Norris. Sheffield [name], M.D.  Electronically Signed    JG/MEDQ  D:  08/15/2007  T:  08/16/2007  Job:  [id number]

## 2010-12-21 NOTE — Discharge Summary (Signed)
[name], [name]              ACCOUNT NO.:  [id number]   MEDICAL RECORD NO.:  [id number]          PATIENT TYPE:  OBV   LOCATION:  4735                         FACILITY:  [facility]   PHYSICIAN:  Santiago Bumpers. Hensel, M.D.DATE OF BIRTH:  11-20-1955   DATE OF ADMISSION:  08/15/2007  DATE OF DISCHARGE:  08/16/2007                               DISCHARGE SUMMARY   PRIMARY CARE Daliah Chaudoin:  Norton Blizzard, M.D. at the [facility].   DISCHARGE DIAGNOSES:  1. Chest pain.  2. Hypertension.  3. Hyperlipidemia.  4. Diabetes mellitus, type 2.  5. Chronic obstructive pulmonary disease.  6. Tobacco abuse.  7. Ventral hernia.   DISCHARGE MEDICATIONS:  1. Aspirin 81 mg p.o. daily.  2. Pantoprazole 40 mg p.o. daily.  3. Lisinopril 10 mg p.o. daily.  4. Metformin 850 mg p.o. b.i.d.  5. Simvastatin 80 mg p.o. daily.  6. Clonazepam 0.5 mg p.o. b.i.d.  7. Metoprolol 12.5 mg p.o. b.i.d.  8. Paroxetine 40 mg p.o. daily.  9. Glipizide 5 mg p.o. daily.   CONSULTATIONS:  None.   PROCEDURES:  None.   LABORATORY:  Cardiac enzymes negative at point-of-care and negative x2  after that.  Hemoglobin 12.6, hematocrit 36.9.   BRIEF HOSPITAL COURSE:  This is a 54 year old male with chest pain and  multiple risk factors for coronary artery disease.  1. Chest pain.  The patient has Framingham risk factor of greater than      30% over 10 years but currently has a nonischemic EKG and negative      cardiac enzymes x3.  Unclear what the cause of the chest pain was      at this time, however, some degree of angina is likely.  Would      recommend an exercise stress EKG as an outpatient followup.  We      have added aspirin and metoprolol.  Would continue other meds for      risk reduction.  2. Hypertension.  Have added metoprolol.  Blood pressure is well      controlled.  He is also on Lisinopril.  3. Hyperlipidemia.  Have changed him to Simvastatin 80 mg p.o. daily.  4. Diabetes  mellitus, type 2.  Continue his Metformin and glipizide.      The Metformin was initially held for concern of dye studies during      this admission but restarted on discharge.  5. Tobacco abuse.  Smoking cessation consult was provided during this      admission.  6. Anemia.  The patient has a hemoglobin of 12.6 and is past due for a      colonoscopy.  Would recommend a colonoscopy as an outpatient      workup.   DISCHARGE INSTRUCTIONS:  The patient is discharged to home in stable  medical condition with no restrictions on activity but is advised to  start a low sodium heart healthy diet.  The patient has a followup  appointment with Dr. Norton Blizzard at the [facility] on January 23rd at 2 p.m.  Romero Belling, MD  Electronically Signed      Santiago Bumpers. Leveda Anna, M.D.  Electronically Signed    MO/MEDQ  D:  08/16/2007  T:  08/16/2007  Job:  [id number]   cc:   Norton Blizzard, M.D.

## 2010-12-22 ENCOUNTER — Ambulatory Visit: Payer: No Typology Code available for payment source | Admitting: Family Medicine

## 2010-12-24 NOTE — Discharge Summary (Signed)
NAMEFRISCO, CORDTS                        ACCOUNT NO.:  [id number]   MEDICAL RECORD NO.:  [id number]                   PATIENT TYPE:  INP   LOCATION:  0355                                 FACILITY:  [facility]   PHYSICIAN:  Jackie Plum, M.D.             DATE OF BIRTH:  [birth year]   DATE OF ADMISSION:  10/09/2003  DATE OF DISCHARGE:  10/20/2003                                 DISCHARGE SUMMARY   DISCHARGE DIAGNOSES:  1. Community acquired pneumonia.  2. Chronic obstructive pulmonary disease  exacerbation, secondary to #1,     resolved.  3. Uncontrolled diabetes mellitus.     A. Hemoglobin A-1-C 9.0% October 15, 2003.  4. History of ventral hernia.   ADDITIONAL DISCHARGE DIAGNOSES:  1. Normocytic anemia with a discharge hemoglobin of 12.2 secondary to acute     illness, outpatient follow up recommended.  2. Hyponatremia probably related to mild syndrome of inappropriate     antidiuretic hormone from pneumonia, resolved.   DISCHARGE LABORATORY DATA:  WBC count 9.1, hemoglobin 12.2, hematocrit 36.5,  MCV 79.8, and platelet count 372,000.  Sodium 136, potassium 4.6, chloride  101, CO2 30, glucose 112, BUN 12, creatinine 0.8, and calcium 8.7.   DISCHARGE MEDICATIONS:  1. Combivent MDI with spacer 2 puffs four times daily.  2. Humibid LA 600 mg two times daily.  3. Protonix 40 mg daily.  4. Glucotrol 10 mg b.i.d.  5. Lantus  10 units subcu injections with insulin pen q.h.s.  6. Glucophage 500 mg p.o. daily.  7. Avelox 400 mg p.o. daily times four day to make a total of 14 days of     antibiotic treatment.  8. Clindamycin 600 mg q.6 hours times four days of treatment.  9. Reglan 10 mg t.i.d. before meals.   ACTIVITY:  Activities as tolerated.   DIET:  Two-thousand-calorie ADA diet.   SPECIAL INSTRUCTIONS:  The patient has been asked to be compliant with  medications and dietary restrictions.  He has been educated on hypoglycemic  symptoms, presentation and asked to take a  ________ of sugar or milk should  he experience any of these.  The patient has been scheduled to follow up  with [facility] for [facility] education and care.  He is  to call for an appointment to follow up with [facility] in  about 10-14 days at which time his x-ray will be repeated in about 10-14  days.   CONSULTATIONS:  Not applicable.   PROCEDURE:  Not applicable.   DISCHARGE CONDITION:  Condition on discharge in improved.   REASON FOR HOSPITALIZATION:  Pneumonia with COPD exacerbation.   HISTORY OF PRESENT ILLNESS:  The patient is a 55 year old Caucasian  gentleman who comes in with a 1.5-week history of worsening shortness of  breath and nonproductive cough.  He has had some chills without any fever.  He was seen earlier at [facility] ER whereupon  he was given some cough  medicine an some prednisone; however, because of worsening symptoms the  patient came to the [facility] ED for further evaluation.   PHYSICAL EXAMINATION:  According to the admission H&P by Dr. Derenda Mis.  VITAL SIGNS:  The patient's blood pressure is 187/82 with a pulse rate of  132, ____________ 117 per minute, respiratory rate of 24, and sat of 95% of  2 liters of oxygen.  GENERAL APPEARANCE:  The patient is in respiratory distress.  NECK:  Does not have an JVD on neck exam.  CHEST AND LUNGS:  Pulmonary auscultation reveals decreased breath sounds at  the bases with scattered rhonchi and end-expiratory wheezes.  HEART:  Cardiac exam is notable for tachycardia without any obvious gallops  or murmur.  ABDOMEN:  Abdomen is noted to be soft and nontender with a ventral hernia.  EXTREMITIES:  The patient does not have any edema on extremity exam.  NEUROLOGIC:  Neurologic exam is nonfocal.   LABORATORY DATA:  Admitting labs were notable for impressive leukocytosis of  27,700 and platelet count was 428,000.  He was not anemic.  Initial cardiac  enzymes were negative for  myocardial infarction.  X-ray showed nodular  densities consistent with possible pneumonia; and, bilateral patchy nodular  opacities.  He was therefore admitted for management of community acquired  pneumonia.   HOSPITAL COURSE:  The patient was admitted to a telemetry bed.  There were  no significant arrhythmias consistently during hospitalization.  He was  started on aggressive pulmonary toileting measures with nebs and  expectorants.  IV antibiotics were also started.  Supportive measures  including supplementary oxygen and IV fluids were also initiated.  He was  also started on IV steroids.  Serial cardiac enzymes were obtained in view  of his respiratory distress and ruled out for myocardial infarction.  In  view of the nodular appearance of his x-ray a follow up CT scan of the chest  was done and it showed patchy diffuse lung opacities some of which were  nodular in character.  It was said to be consistent with an inflammatory  process, but could not rule out metastases or septic emboli.  There was no  mediastinal or hilar adenopathy seen.  A 2-D echo was done to rule out  septic emboli and there was no evidence of vegetation.  Echocardiogram done  on October 10, 2003 showed overall normal left ventricular systolic function  with an EF of 55-65%.  There was no regional wall motion abnormality.   It was believed that the patient's imaging abnormalities were related to his  pneumonia; however, it was recommended that the patient get a repeat CAT  scan in a couple week as an outpatient.  I have discussed this with the  patient and his sister over the weekend.  I told him that it is absolutely  necessary for them to follow up with the [facility] to get his tests done in  the next few weeks, and they expressed understanding.   The patient has a history of diabetes mellitus, which has not been  controlled.  During the hospitalization the patient had episodes of intermittent severe  hyperglycemia.  His diabetic regimen has been tailored  to keep his CBGs within acceptable ranges.  I had a discussion with him  regarding hyperglycemic symptoms and felt that he will need to follow up  with his doctor to get the dose of his antidiabetic regimen tailored to the  appropriate level;  and, also the need to follow up with a provide to make  sure that there is consistent optimization of his diabetic status.   This morning during rounds Mr. Keisler is doing well.  He complained of  some headaches earlier, which were relieved with medication, i.e. Tylenol.  He does not have any fever or chills.  He does not have any nausea or  vomiting.  He is maintaining good and adequate p.o. intake.  His temperature  is 98.2 degrees Fahrenheit, pulse rate of 75, respiratory rate of 20, BP of  134/78, and O2 saturation of 98% on 1 liter and 95% on room air.  His CBG  was 84 mg/dl.  On general examination he is well-looking.  He is not ill-  looking at all.  He is not in any distress cardiopulmonary wise.  HEENT is  normocephalic and atraumatic.  Pupils equal, round and react to light.  Extraocular movements are intact. His mucous membranes are moist.  He has  marked conjunctival pallor and icterus.  The neck did not reveal any JVD.  Lung exam indicates rhonchi with very minimal wheezes and adequate air  entry.  Cardiac exam did not reveal any gallops.  Abdomen is soft and  nontender.  Bowel sounds are present; they were normoactive.  He does have a  ventral hernia as noted previously.  Extremity exam did not reveal any  edema.  He is alert and oriented.   Overall, Mr. Ofallon  has improved significantly.  His leukocytosis of more  than 27,000 has resolved.  He does not have any fever.  His O2 saturation is  within acceptable ranges.  He has been ambulatory on the floor today without  any dyspnea.  He has been able to maintain adequate p.o. intake.  He is  deemed appropriate for discharge  today.   The patient will go home with the above-medications and appointments.  The  patient has a would on his belly where he has a ventral hernia.  He was seen  by wound care consultant and he has been taught how to do dressing changes  and apply hydro-gel to wounds.  He is going home with gauze and hydro-gel to  continue wound care at that level.  We will also have the patient followed  by care management to study his home situation.  Care management will assist  with obtaining free medicines from the hospital prior to discharge today.   I spent more than 35 minutes preparing this patient for discharge today.                                               Jackie Plum, M.D.    GO/MEDQ  D:  10/20/2003  T:  10/20/2003  Job:  [id number]   cc:   [facility]   [facility]

## 2010-12-31 ENCOUNTER — Encounter: Payer: Self-pay | Admitting: Family Medicine

## 2010-12-31 ENCOUNTER — Ambulatory Visit (INDEPENDENT_AMBULATORY_CARE_PROVIDER_SITE_OTHER): Payer: No Typology Code available for payment source | Admitting: Family Medicine

## 2010-12-31 VITALS — BP 110/70 | HR 68 | Temp 97.7°F | Ht 61.0 in | Wt 216.0 lb

## 2010-12-31 DIAGNOSIS — E119 Type 2 diabetes mellitus without complications: Secondary | ICD-10-CM

## 2010-12-31 DIAGNOSIS — IMO0002 Reserved for concepts with insufficient information to code with codable children: Secondary | ICD-10-CM

## 2010-12-31 DIAGNOSIS — K651 Peritoneal abscess: Secondary | ICD-10-CM

## 2010-12-31 HISTORY — DX: Reserved for concepts with insufficient information to code with codable children: IMO0002

## 2010-12-31 MED ORDER — METRONIDAZOLE 500 MG PO TABS: 500.0000 mg | ORAL_TABLET | Freq: Two times a day (BID) | ORAL | Status: AC

## 2010-12-31 MED ORDER — INSULIN GLARGINE 100 UNIT/ML ~~LOC~~ SOLN: 35.0000 [IU] | Freq: Every day | SUBCUTANEOUS | Status: DC

## 2010-12-31 MED ORDER — DOXYCYCLINE HYCLATE 50 MG PO CAPS: 100.0000 mg | ORAL_CAPSULE | Freq: Two times a day (BID) | ORAL | Status: AC

## 2010-12-31 NOTE — Patient Instructions (Addendum)
Please make appt for next week with Dr Janalyn Harder for wound.  Doxycycline 100mg  twice a day for 10 days.  Flagyl 500mg  twice a day for 10 days.  Insulin 35 units at night.

## 2010-12-31 NOTE — Progress Notes (Signed)
  Subjective:    Patient ID: [name], male    DOB: May 23, 1956, 55 y.o.   MRN: [id number]  HPI  DIABETES: Taking Lantus 28 units each morning.  Metformin 1000mg  bid.   Compliance: yes CBGs: not checking.  He does not have money for the strips. Lightheadedness:no  Chest pain: no   Syncope: no   Palpitations: no   Dyspnea: no      ABD lesions: Pt with history of large ventral hernia.  He has multiple skin lesions on his abd throughout the last several months. These lesions are leaking and he is applying bandage on them. Discharge is yellowish in color. There is an odor to it.  He has been scratching around it.  No fever, chills, abd pain, nausea, vomiting, diarrhea.   Review of Systems Per hpi    Objective:   Physical Exam GEN: nad, alert, appropriate HEENT: [place]/at, MMM, no cervical nodes RESP: some exp wheezes, good air movements CVS: RRR ABD: large abd hernia that leaves a protruding mass to the right of abd. Skin is cool. There are 2 areas of exposed epidermis, the bigger one is located medially and is about 3cm in length and 1cm in width. + bloody discharge. +yellow pustule on the superficial surface.  Laterally to the left of that lesion, there is another lesion that is about 2cm in diameter. This one has yellow discharge, but is not bleeding. No redness, swelling, flutulance, erythema, streaking.        Assessment & Plan:

## 2011-01-03 NOTE — Assessment & Plan Note (Signed)
Superficial abrasions on abd.  Because of large ventral hernia and discharge I am concerned for infection/abscess.  There was no area of fluctulance and pt denies systemic symptoms.  Will start doxycycline and flagyl.  Will refer to wound care clinic. Pt to rtc next week for reevaluation. I would be hesitant to I&D any abscess given large hernia.

## 2011-01-03 NOTE — Assessment & Plan Note (Addendum)
A1C has worsened in the last 3 months (from 8.3 to 10.5).  His insulin has been difficult to dose since he does not check CBGs. Will continue Metformin and increase Lantus from 28 to 35 units.

## 2011-01-06 ENCOUNTER — Encounter: Payer: Self-pay | Admitting: Family Medicine

## 2011-01-06 ENCOUNTER — Ambulatory Visit (INDEPENDENT_AMBULATORY_CARE_PROVIDER_SITE_OTHER): Payer: No Typology Code available for payment source | Admitting: Family Medicine

## 2011-01-06 VITALS — BP 130/73 | HR 73 | Temp 97.3°F | Ht 61.0 in | Wt 214.0 lb

## 2011-01-06 DIAGNOSIS — E119 Type 2 diabetes mellitus without complications: Secondary | ICD-10-CM

## 2011-01-06 DIAGNOSIS — IMO0002 Reserved for concepts with insufficient information to code with codable children: Secondary | ICD-10-CM

## 2011-01-06 DIAGNOSIS — K651 Peritoneal abscess: Secondary | ICD-10-CM

## 2011-01-06 MED ORDER — LANTUS SOLOSTAR 100 UNIT/ML ~~LOC~~ SOPN: 40.0000 [IU] | PEN_INJECTOR | Freq: Every day | SUBCUTANEOUS | Status: DC

## 2011-01-06 MED ORDER — ONETOUCH ULTRASOFT LANCETS MISC: Status: DC

## 2011-01-06 MED ORDER — GLUCOSE BLOOD VI STRP: ORAL_STRIP | Status: DC

## 2011-01-06 NOTE — Patient Instructions (Signed)
Please make appt to see Dr Janalyn Harder in 2 wks. New insulin dose: 40 units every evening. Check sugar before breakfast and before bedtime.  At night check your sugar before you take your insulin. Write down these numbers. Call Dr Janalyn Harder next Wed with sugar numbers.

## 2011-01-07 NOTE — Assessment & Plan Note (Signed)
Improved from last visit.  Will cont antibiotics for total 10 days.  Will f/u in 2 wks.  He has appt with wound center.

## 2011-01-07 NOTE — Progress Notes (Signed)
  Subjective:    Patient ID: [name], male    DOB: 10/06/1955, 55 y.o.   MRN: [id number]  HPI Pt is here for f/u of abd abscess.  He has been taking Flagyl and doxycycline since last visit. He denies fever, chills, nausea, vomiting, cough.  BMs have been normal.     Review of Systems Negative except hpi     Objective:   Physical Exam GEN: nad, aox3 ABD: +BS, no distention, large ventral hernia that deviates to her R. Abscesses located medially and one on left quadrant. The bigger of the two is the one medially.  It is the size of 2cm x 3cm, no fluctuance, +drainage of yellow fluid. The skin around this is not red, swollen or warm. No streaks. 2nd abscess is 1.5cm x 1.5cm.        Assessment & Plan:

## 2011-01-19 ENCOUNTER — Ambulatory Visit: Payer: No Typology Code available for payment source | Admitting: Family Medicine

## 2011-01-20 ENCOUNTER — Encounter (HOSPITAL_BASED_OUTPATIENT_CLINIC_OR_DEPARTMENT_OTHER): Payer: No Typology Code available for payment source | Attending: Internal Medicine

## 2011-01-20 DIAGNOSIS — Z7902 Long term (current) use of antithrombotics/antiplatelets: Secondary | ICD-10-CM | POA: Insufficient documentation

## 2011-01-20 DIAGNOSIS — S21109A Unspecified open wound of unspecified front wall of thorax without penetration into thoracic cavity, initial encounter: Secondary | ICD-10-CM | POA: Insufficient documentation

## 2011-01-20 DIAGNOSIS — D509 Iron deficiency anemia, unspecified: Secondary | ICD-10-CM | POA: Insufficient documentation

## 2011-01-20 DIAGNOSIS — Z8673 Personal history of transient ischemic attack (TIA), and cerebral infarction without residual deficits: Secondary | ICD-10-CM | POA: Insufficient documentation

## 2011-01-20 DIAGNOSIS — K439 Ventral hernia without obstruction or gangrene: Secondary | ICD-10-CM | POA: Insufficient documentation

## 2011-01-20 DIAGNOSIS — I2510 Atherosclerotic heart disease of native coronary artery without angina pectoris: Secondary | ICD-10-CM | POA: Insufficient documentation

## 2011-01-20 DIAGNOSIS — J449 Chronic obstructive pulmonary disease, unspecified: Secondary | ICD-10-CM | POA: Insufficient documentation

## 2011-01-20 DIAGNOSIS — E7800 Pure hypercholesterolemia, unspecified: Secondary | ICD-10-CM | POA: Insufficient documentation

## 2011-01-20 DIAGNOSIS — Z79899 Other long term (current) drug therapy: Secondary | ICD-10-CM | POA: Insufficient documentation

## 2011-01-20 DIAGNOSIS — X58XXXA Exposure to other specified factors, initial encounter: Secondary | ICD-10-CM | POA: Insufficient documentation

## 2011-01-20 DIAGNOSIS — Z7982 Long term (current) use of aspirin: Secondary | ICD-10-CM | POA: Insufficient documentation

## 2011-01-20 DIAGNOSIS — I7389 Other specified peripheral vascular diseases: Secondary | ICD-10-CM | POA: Insufficient documentation

## 2011-01-20 DIAGNOSIS — Z794 Long term (current) use of insulin: Secondary | ICD-10-CM | POA: Insufficient documentation

## 2011-01-20 DIAGNOSIS — E1149 Type 2 diabetes mellitus with other diabetic neurological complication: Secondary | ICD-10-CM | POA: Insufficient documentation

## 2011-01-20 DIAGNOSIS — E1142 Type 2 diabetes mellitus with diabetic polyneuropathy: Secondary | ICD-10-CM | POA: Insufficient documentation

## 2011-01-20 DIAGNOSIS — I4891 Unspecified atrial fibrillation: Secondary | ICD-10-CM | POA: Insufficient documentation

## 2011-01-20 DIAGNOSIS — I10 Essential (primary) hypertension: Secondary | ICD-10-CM | POA: Insufficient documentation

## 2011-01-20 DIAGNOSIS — J4489 Other specified chronic obstructive pulmonary disease: Secondary | ICD-10-CM | POA: Insufficient documentation

## 2011-01-20 DIAGNOSIS — I739 Peripheral vascular disease, unspecified: Secondary | ICD-10-CM | POA: Insufficient documentation

## 2011-01-20 DIAGNOSIS — S31109A Unspecified open wound of abdominal wall, unspecified quadrant without penetration into peritoneal cavity, initial encounter: Secondary | ICD-10-CM | POA: Insufficient documentation

## 2011-01-21 NOTE — Assessment & Plan Note (Unsigned)
[facility]  [name], KEENUM              ACCOUNT NO.:  [id number]  MEDICAL RECORD NO.:  [id number]      DATE OF BIRTH:  [birth year]  PHYSICIAN:  Maxwell Caul, M.D. VISIT DATE:  01/20/2011                                  OFFICE VISIT   [name] is a 55 year old man with multiple medical issues who is referred by his physician, Dr. Janalyn Harder at [facility] for review of an abdominal wound.  Among his multiple medical issues, he has poorly- controlled diabetes and COPD.  He tells me that he has developed a wound on his anterior abdominal wall for roughly 4 months.  This was in the setting of a very large abdominal ventral hernia.  He thinks he got this by "scratching it."  It is not clear to me that he was doing anything for this up until 2 or 3 weeks ago, he was put on antibiotics, but has not been using a topical dressing.  He still continues to have difficulties with touching these wounds.  He also has an area just under the left mid clavicle that he says is also "scratching injury.".  PAST MEDICAL HISTORY:  Really quite extensive and includes: 1. PVD with claudication. 2. Significant chronic obstructive pulmonary disease. 3. Coronary artery disease. 4. Hypertension. 5. Atrial fibrillation. 6. CVA. 7. Diabetes with diabetic neuropathy. 8. Abdominal abscess repair in 1996 and 1997. 9. Hypercholesterolemia. 10.Iron deficiency anemia. 11.Chronic low back pain. 12.History of a large ventral abdominal hernia for which he has been     told that surgical repair would be prohibitively risky. 13.Bilateral cataract replacement.  Medication list is completely reviewed.  He is on both aspirin and Plavix.  Also, on Lipitor, clonazepam, Pradaxa, diltiazem, furosemide, Neurontin, lisinopril, metformin, metoprolol, Lantus insulin 40 at bedtime, doxycycline, and Flagyl, he actually finished today.  PHYSICAL EXAMINATION:  VITAL SIGNS:  His temperature  is 98, pulse 66, respirations 18, blood pressure 142/83, CBG is 286. RESPIRATORY:  He has decreased air entry bilaterally, however, no crackles or wheezes noted. CARDIAC:  No evidence of congestive heart failure.  There is no S3. ABDOMEN:  There is an absolutely huge abdominal ventral hernia, leaning towards the right.  The wound is actually right in the middle of this hernia.  There are no masses.  There is no tenderness over the hernial area.  WOUND EXAM:  There are actually 2 first of all in the middle of the abdominal ventral hernia, is an area measuring 2 x 2.3 x 0.1.  This underwent a light nonexcisional debridement of surface eschar which was actually quite fibrous.  He also has an area just under the left mid clavicle, measuring 1 x 1.6 x 0.1.  This did not require debridement. No evidence of infection was seen at either site.  No cultures were done.  IMPRESSION: 1. Abdominal wound in the center of an absolutely huge ventral     abdominal hernia.  He has been told he is not a candidate for     surgical repair of this area.  The area was debrided as noted     above.  The problems with the areas like this include the tensile     stress across the wound caused by the prolapsing ventral  hernia.     In fact if you looked over the entire area, there is probably at     least 2 other healed areas that were previous wounds in this area.     It is not really clear how these were healed.  In any case, we     prescribed silver collagen with hydrogel.  In order to have any     chance of healing this easily, I think he will need some form of     abdominal binder for which he would have to go to [facility]     supply.  I wrote a script for this. 2. Traumatic wound just under the left clavicle.  This again has no     evidence of infection.  We simply apply collagen to this area.  We     will see him back in a week's time.          ______________________________ Maxwell Caul,  M.D.     MGR/MEDQ  D:  01/20/2011  T:  01/21/2011  Job:  [id number]

## 2011-01-24 ENCOUNTER — Encounter: Payer: Self-pay | Admitting: Family Medicine

## 2011-01-24 DIAGNOSIS — K439 Ventral hernia without obstruction or gangrene: Secondary | ICD-10-CM | POA: Insufficient documentation

## 2011-02-10 ENCOUNTER — Encounter (HOSPITAL_BASED_OUTPATIENT_CLINIC_OR_DEPARTMENT_OTHER): Payer: No Typology Code available for payment source | Attending: Internal Medicine

## 2011-02-10 ENCOUNTER — Ambulatory Visit: Payer: No Typology Code available for payment source | Admitting: Family Medicine

## 2011-02-10 DIAGNOSIS — I2510 Atherosclerotic heart disease of native coronary artery without angina pectoris: Secondary | ICD-10-CM | POA: Insufficient documentation

## 2011-02-10 DIAGNOSIS — I4891 Unspecified atrial fibrillation: Secondary | ICD-10-CM | POA: Insufficient documentation

## 2011-02-10 DIAGNOSIS — Z79899 Other long term (current) drug therapy: Secondary | ICD-10-CM | POA: Insufficient documentation

## 2011-02-10 DIAGNOSIS — I7389 Other specified peripheral vascular diseases: Secondary | ICD-10-CM | POA: Insufficient documentation

## 2011-02-10 DIAGNOSIS — S31109A Unspecified open wound of abdominal wall, unspecified quadrant without penetration into peritoneal cavity, initial encounter: Secondary | ICD-10-CM | POA: Insufficient documentation

## 2011-02-10 DIAGNOSIS — E1149 Type 2 diabetes mellitus with other diabetic neurological complication: Secondary | ICD-10-CM | POA: Insufficient documentation

## 2011-02-10 DIAGNOSIS — I10 Essential (primary) hypertension: Secondary | ICD-10-CM | POA: Insufficient documentation

## 2011-02-10 DIAGNOSIS — K439 Ventral hernia without obstruction or gangrene: Secondary | ICD-10-CM | POA: Insufficient documentation

## 2011-02-10 DIAGNOSIS — Z7982 Long term (current) use of aspirin: Secondary | ICD-10-CM | POA: Insufficient documentation

## 2011-02-10 DIAGNOSIS — J4489 Other specified chronic obstructive pulmonary disease: Secondary | ICD-10-CM | POA: Insufficient documentation

## 2011-02-10 DIAGNOSIS — Z7902 Long term (current) use of antithrombotics/antiplatelets: Secondary | ICD-10-CM | POA: Insufficient documentation

## 2011-02-10 DIAGNOSIS — X58XXXA Exposure to other specified factors, initial encounter: Secondary | ICD-10-CM | POA: Insufficient documentation

## 2011-02-10 DIAGNOSIS — S21109A Unspecified open wound of unspecified front wall of thorax without penetration into thoracic cavity, initial encounter: Secondary | ICD-10-CM | POA: Insufficient documentation

## 2011-02-10 DIAGNOSIS — Z8673 Personal history of transient ischemic attack (TIA), and cerebral infarction without residual deficits: Secondary | ICD-10-CM | POA: Insufficient documentation

## 2011-02-10 DIAGNOSIS — E7800 Pure hypercholesterolemia, unspecified: Secondary | ICD-10-CM | POA: Insufficient documentation

## 2011-02-10 DIAGNOSIS — D509 Iron deficiency anemia, unspecified: Secondary | ICD-10-CM | POA: Insufficient documentation

## 2011-02-10 DIAGNOSIS — Z794 Long term (current) use of insulin: Secondary | ICD-10-CM | POA: Insufficient documentation

## 2011-02-10 DIAGNOSIS — J449 Chronic obstructive pulmonary disease, unspecified: Secondary | ICD-10-CM | POA: Insufficient documentation

## 2011-02-10 DIAGNOSIS — E1142 Type 2 diabetes mellitus with diabetic polyneuropathy: Secondary | ICD-10-CM | POA: Insufficient documentation

## 2011-02-10 DIAGNOSIS — I739 Peripheral vascular disease, unspecified: Secondary | ICD-10-CM | POA: Insufficient documentation

## 2011-03-05 ENCOUNTER — Emergency Department (HOSPITAL_COMMUNITY)
Admission: EM | Admit: 2011-03-05 | Discharge: 2011-03-05 | Disposition: A | Discharge status: Home / Self Care | Payer: No Typology Code available for payment source | Attending: Emergency Medicine | Admitting: Emergency Medicine

## 2011-03-05 ENCOUNTER — Emergency Department (HOSPITAL_COMMUNITY): Payer: No Typology Code available for payment source

## 2011-03-05 DIAGNOSIS — R079 Chest pain, unspecified: Secondary | ICD-10-CM | POA: Insufficient documentation

## 2011-03-05 DIAGNOSIS — I10 Essential (primary) hypertension: Secondary | ICD-10-CM | POA: Insufficient documentation

## 2011-03-05 DIAGNOSIS — E119 Type 2 diabetes mellitus without complications: Secondary | ICD-10-CM | POA: Insufficient documentation

## 2011-03-05 DIAGNOSIS — R0602 Shortness of breath: Secondary | ICD-10-CM | POA: Insufficient documentation

## 2011-03-05 DIAGNOSIS — Z8673 Personal history of transient ischemic attack (TIA), and cerebral infarction without residual deficits: Secondary | ICD-10-CM | POA: Insufficient documentation

## 2011-03-05 LAB — CBC
HCT: 36.2 % — ABNORMAL LOW (ref 39.0–52.0)
Hemoglobin: 12.1 g/dL — ABNORMAL LOW (ref 13.0–17.0)
MCH: 25.3 pg — ABNORMAL LOW (ref 26.0–34.0)
MCHC: 33.4 g/dL (ref 30.0–36.0)
RBC: 4.79 MIL/uL (ref 4.22–5.81)

## 2011-03-05 LAB — DIFFERENTIAL
Lymphocytes Relative: 33 % (ref 12–46)
Lymphs Abs: 3 10*3/uL (ref 0.7–4.0)
Monocytes Absolute: 0.7 10*3/uL (ref 0.1–1.0)
Monocytes Relative: 7 % (ref 3–12)
Neutro Abs: 4.8 10*3/uL (ref 1.7–7.7)
Neutrophils Relative %: 53 % (ref 43–77)

## 2011-03-05 LAB — BASIC METABOLIC PANEL
BUN: 14 mg/dL (ref 6–23)
CO2: 30 mEq/L (ref 19–32)
Calcium: 10.3 mg/dL (ref 8.4–10.5)
GFR calc non Af Amer: >60 mL/min (ref >60)
Glucose, Bld: 259 mg/dL — ABNORMAL HIGH (ref 70–99)
Sodium: 135 mEq/L (ref 135–145)

## 2011-03-05 LAB — PRO B NATRIURETIC PEPTIDE: Pro B Natriuretic peptide (BNP): 67.2 pg/mL (ref 0–125)

## 2011-03-23 ENCOUNTER — Other Ambulatory Visit: Payer: Medicare Other

## 2011-03-23 ENCOUNTER — Ambulatory Visit: Payer: Medicare Other | Admitting: Vascular Surgery

## 2011-03-29 ENCOUNTER — Ambulatory Visit: Payer: No Typology Code available for payment source | Admitting: Family Medicine

## 2011-04-06 ENCOUNTER — Encounter: Payer: Self-pay | Admitting: Vascular Surgery

## 2011-04-12 ENCOUNTER — Encounter: Payer: Self-pay | Admitting: Vascular Surgery

## 2011-04-13 ENCOUNTER — Ambulatory Visit: Payer: Self-pay | Admitting: Vascular Surgery

## 2011-04-13 ENCOUNTER — Other Ambulatory Visit: Payer: Self-pay

## 2011-04-27 LAB — D-DIMER, QUANTITATIVE: D-Dimer, Quant: 0.63 — ABNORMAL HIGH

## 2011-04-27 LAB — CARDIAC PANEL(CRET KIN+CKTOT+MB+TROPI)
CK, MB: 1.7
CK, MB: 1.8
Total CK: 48
Total CK: 54
Troponin I: 0.02

## 2011-04-27 LAB — DIFFERENTIAL
Basophils Absolute: 0
Lymphocytes Relative: 36
Lymphs Abs: 2.6
Monocytes Absolute: 0.6
Monocytes Relative: 8
Neutro Abs: 3.5

## 2011-04-27 LAB — CBC
HCT: 36.9 — ABNORMAL LOW
Hemoglobin: 12.6 — ABNORMAL LOW
RBC: 4.81
RDW: 14.6
WBC: 7.2

## 2011-04-27 LAB — BASIC METABOLIC PANEL
BUN: 9
Chloride: 108
Creatinine, Ser: 0.71
Glucose, Bld: 120 — ABNORMAL HIGH
Potassium: 4.2

## 2011-04-27 LAB — I-STAT 8, (EC8 V) (CONVERTED LAB)
Acid-Base Excess: 1
Bicarbonate: 26 — ABNORMAL HIGH
Glucose, Bld: 137 — ABNORMAL HIGH
Potassium: 4.3
Sodium: 140
TCO2: 27
pH, Ven: 7.402 — ABNORMAL HIGH

## 2011-04-27 LAB — POCT CARDIAC MARKERS
CKMB, poc: 1.5
Myoglobin, poc: 57.5
Troponin i, poc: <0.05

## 2011-04-27 LAB — TSH: TSH: 1.613

## 2011-04-27 LAB — HEPATIC FUNCTION PANEL
AST: 23
Bilirubin, Direct: <0.1
Total Bilirubin: 0.4

## 2011-05-16 ENCOUNTER — Ambulatory Visit: Payer: No Typology Code available for payment source | Admitting: Family Medicine

## 2011-05-17 ENCOUNTER — Encounter: Payer: Self-pay | Admitting: Family Medicine

## 2011-05-17 ENCOUNTER — Ambulatory Visit (INDEPENDENT_AMBULATORY_CARE_PROVIDER_SITE_OTHER): Payer: No Typology Code available for payment source | Admitting: Family Medicine

## 2011-05-17 DIAGNOSIS — M5430 Sciatica, unspecified side: Secondary | ICD-10-CM

## 2011-05-17 LAB — CBC
Hemoglobin: 13.2
Platelets: 289
RDW: 14.1
WBC: 6.6

## 2011-05-17 LAB — DIFFERENTIAL
Basophils Absolute: 0.1
Lymphocytes Relative: 30
Neutro Abs: 3.6

## 2011-05-17 LAB — BASIC METABOLIC PANEL
BUN: 5 — ABNORMAL LOW
Calcium: 9.8
GFR calc non Af Amer: >60
Glucose, Bld: 411 — ABNORMAL HIGH
Sodium: 132 — ABNORMAL LOW

## 2011-05-17 NOTE — Patient Instructions (Signed)
Motrin 600mg  by mouth every 6 hours for pain.  Do the rehab exercises as below. Return in 2-3 weeks for follow up.  Increase gabapentin to 300mg   2 x per day.  Sciatica with Rehab   The sciatic nerve runs from the back down the leg and is responsible for sensation and control of the muscles in the back (posterior) side of the thigh, lower leg, and foot. Sciatica is a condition that is characterized by inflammation of this nerve.     SYMPTOMS  Signs of nerve damage, including numbness and/or weakness along the posterior side of the lower extremity.  Pain in the back of the thigh that may also travel down the leg.  Pain that worsens when sitting for long periods of time.  Occasionally, pain in the back or buttock.   CAUSES Inflammation of the sciatic nerve is the cause of sciatica. The inflammation is due to something irritating the nerve. Common sources of irritation include:  Sitting for long periods of time.  Direct trauma to the nerve.  Arthritis of the spine.  Herniated or ruptured disk.  Slipping of the vertebrae (spondylolithesis )  Pressure from soft tissues, such as muscles or ligament-like tissue (fascia).   RISK INCREASES WITH   Sports that place pressure or stress on the spine (football or weightlifting).  Poor strength and flexibility.  Failure to warm-up properly before activity.  Family history of low back pain or disk disorders.  Previous back injury or surgery.  Poor body mechanics, especially when lifting, or poor posture.   PREVENTIVE MEASURES   Warm up and stretch properly before activity.  Maintain physical fitness: l Strength, flexibility, and endurance.  Cardiovascular fitness.  Learn and use proper technique, especially with posture and lifting. When possible, have coach correct improper technique.  Avoid activities that place stress on the spine.   EXPECTED OUTCOME If treated properly, then sciatica usually resolves within 6 weeks.  However, occasionally surgery is necessary.    POSSIBLE COMPLICATIONS   Permanent nerve damage, including pain, numbness, tingle, or weakness.  Chronic back pain.  Risks of surgery: infection, bleeding, nerve damage, or damage to surrounding tissues.   GENERAL TREATMENT CONSIDERATIONS  Treatment initially involves resting from any activities that aggravate your symptoms. The use of ice and medication may help reduce pain and inflammation. The use of strengthening and stretching exercises may help reduce pain with activity. These exercises may be performed at home or with referral to a therapist. A therapist may recommend further treatments, such as transcutaneous electronic nerve stimulation (TENS) or ultrasound.  Your caregiver may recommend corticosteroid injections to help reduce inflammation of the sciatic nerve. If symptoms persist despite non-surgical (conservative) treatment, then surgery may be recommended.   MEDICATION:   If pain medication is necessary, then nonsteroidal anti-inflammatory medications, such as aspirin and ibuprofen, or other minor pain relievers, such as acetaminophen, are often recommended.   Do not take pain medication for 7 days before surgery.   Prescription pain relievers may be given if deemed necessary by your caregiver. Use only as directed and only as much as you need.  Ointments applied to the skin may be helpful.  Corticosteroid injections may be given by your caregiver. These injections should be reserved for the most serious cases, because they may only be given a certain number of times.   HEAT AND COLD:   Cold treatment (icing) relieves pain and reduces inflammation. Cold treatment should be applied for 10 to 15 minutes every 2  to 3 hours for inflammation and pain and immediately after any activity that aggravates your symptoms. Use ice packs or massage the area with a piece of ice (ice massage).  Heat treatment may be used prior to performing the  stretching and strengthening activities prescribed by your caregiver, physical therapist, or athletic trainer. Use a heat pack or soak the injury in warm water.   SEEK MEDICAL TREATMENT IF:  Treatment seems to offer no benefit, or the condition worsens.  Any medications produce adverse side effects.   EXERCISES    RANGE OF MOTION AND STRETCHING EXERCISES - Sciatica Most people with sciatic will find that their symptoms worsen with either excessive bending forward (flexion) or arching at the low back (extension). The exercises which will help resolve your symptoms will focus on the opposite motion. Your physician, physical therapist or athletic trainer will help you determine which exercises will be most helpful to resolve your low back pain. Do not complete any exercises without first consulting with your clinician. Discontinue any exercises which worsen your symptoms until you speak to your clinician. If you have pain, numbness or tingling which travels down into your buttocks, leg or foot, the goal of the therapy is for these symptoms to move closer to your back and eventually resolve. Occasionally, these leg symptoms will get better, but your low back pain may worsen; this is typically an indication of progress in your rehabilitation. Be certain to be very alert to any changes in your symptoms and the activities in which you participated in the 24 hours prior to the change. Sharing this information with your clinician will allow him/her to most efficiently treat your condition.   These exercises may help you when beginning to rehabilitate your injury. Your symptoms may resolve with or without further involvement from your physician, physical therapist or athletic trainer. While completing these exercises, remember:   Restoring tissue flexibility helps normal motion to return to the joints. This allows healthier, less painful movement and activity.  An effective stretch should be held for at least  30 seconds.  A stretch should never be painful. You should only feel a gentle lengthening or release in the stretched tissue.      FLEXION RANGE OF MOTION AND STRETCHING EXERCISES:   STRETCH - Flexion, Single Knee to Chest   Lie on a firm bed or floor with both legs extended in front of you.  Keeping one leg in contact with the floor, bring your opposite knee to your chest. Hold your leg in place by either grabbing behind your thigh or at your knee.  Pull until you feel a gentle stretch in your low back. Hold __________ seconds.  Slowly release your grasp and repeat the exercise with the opposite side. Repeat __________ times. Complete this exercise __________ times per day.      STRETCH - Flexion, Double Knee to Chest   Lie on a firm bed or floor with both legs extended in front of you.  Keeping one leg in contact with the floor, bring your opposite knee to your chest.    Tense your stomach muscles to support your back and then lift your other knee to your chest. Hold your legs in place by either grabbing behind your thighs or at your knees.  Pull both knees toward your chest until you feel a gentle stretch in your low back. Hold __________ seconds.  Tense your stomach muscles and slowly return one leg at a time to the floor. Repeat  __________ times. Complete this exercise __________ times per day.      STRETCH - Low Trunk Rotation   Lie on a firm bed or floor. Keeping your legs in front of you, bend your knees so they are both pointed toward the ceiling and your feet are flat on the floor.  Extend your arms out to the side. This will stabilize your upper body by keeping your shoulders in contact with the floor.  Gently and slowly drop both knees together to one side until you feel a gentle stretch in your low back. Hold for __________ seconds.   Tense your stomach muscles to support your low back as you bring your knees back to the starting position.  Repeat the exercise to  the other side. Repeat __________ times. Complete this exercise __________ times per day      EXTENSION RANGE OF MOTION AND FLEXIBILITY EXERCISES:   STRETCH - Extension, Prone on Elbows   Lie on your stomach on the floor, a bed will be too soft. Place your palms about shoulder width apart and at the height of your head.  Place your elbows under your shoulders. If this is too painful, stack pillows under your chest.  Allow your body to relax so that your hips drop lower and make contact more completely with the floor.  Hold this position for __________ seconds.  Slowly return to lying flat on the floor. Repeat __________ times. Complete this exercise __________ times per day.      RANGE OF MOTION - Extension, Prone Press Ups   Lie on your stomach on the floor, a bed will be too soft. Place your palms about shoulder width apart and at the height of your head.  Keeping your back as relaxed as possible, slowly straighten your elbows while keeping your hips on the floor. You may adjust the placement of your hands to maximize your comfort. As you gain motion, your hands will come more underneath your shoulders.  Hold this position __________ seconds.  Slowly return to lying flat on the floor. Repeat __________ times. Complete this exercise __________ times per day.      STRENGTHENING EXERCISES - Sciatica  These exercises may help you when beginning to rehabilitate your injury. These exercises should be done near your "sweet spot." This is the neutral, low-back arch, somewhere between fully rounded and fully arched, that is your least painful position. When performed in this safe range of motion, these exercises can be used for people who have either a flexion or extension based injury. These exercises may resolve your symptoms with or without further involvement from your physician, physical therapist or athletic trainer. While completing these exercises, remember:   Muscles can gain  both the endurance and the strength needed for everyday activities through controlled exercises.  Complete these exercises as instructed by your physician, physical therapist or athletic trainer. Progress with the resistance and repetition exercises only as your caregiver advises.  You may experience muscle soreness or fatigue, but the pain or discomfort you are trying to eliminate should never worsen during these exercises.  If this pain does worsen, stop and make certain you are following the directions exactly. If the pain is still present after adjustments, discontinue the exercise until you can discuss the trouble with your clinician.    STRENGTHENING - Deep Abdominals, Pelvic Tilt   Lie on a firm bed or floor. Keeping your legs in front of you, bend your knees so they are both pointed toward  the ceiling and your feet are flat on the floor.  Tense your lower abdominal muscles to press your low back into the floor. This motion will rotate your pelvis so that your tail bone is scooping upwards rather than pointing at your feet or into the floor.  With a gentle tension and even breathing, hold this position for __________ seconds. Repeat __________ times. Complete this exercise __________ times per day.      STRENGTHENING - Abdominals, Crunches   Lie on a firm bed or floor. Keeping your legs in front of you, bend your knees so they are both pointed toward the ceiling and your feet are flat on the floor. Cross your arms over your chest.    Slightly tip your chin down without bending your neck.  Tense your abdominals and slowly lift your trunk high enough to just clear your shoulder blades. Lifting higher can put excessive stress on the low back and does not further strengthen your abdominal muscles.  Control your return to the starting position. Repeat __________ times. Complete this exercise __________ times per day.      STRENGTHENING - Quadruped, Opposite UE/LE Lift   Assume a  hands and knees position on a firm surface. Keep your hands under your shoulders and your knees under your hips. You may place padding under your knees for comfort.    Find your neutral spine and gently tense your abdominal muscles so that you can maintain this position. Your shoulders and hips should form a rectangle that is parallel with the floor and is not twisted.   Keeping your trunk steady, lift your right hand no higher than your shoulder and then your left leg no higher than your hip. Make sure you are not holding your breath. Hold this position __________ seconds.  Continuing to keep your abdominal muscles tense and your back steady, slowly return to your starting position. Repeat with the opposite arm and leg. Repeat __________ times. Complete this exercise __________ times per day.      STRENGTHENING - Abdominals and Quadriceps, Straight Leg Raise   Lie on a firm bed or floor with both legs extended in front of you.  Keeping one leg in contact with the floor, bend the other knee so that your foot can rest flat on the floor.  Find your neutral spine, and tense your abdominal muscles to maintain your spinal position throughout the exercise.  Slowly lift your straight leg off the floor about 6 inches for a count of 15, making sure to not hold your breath.  Still keeping your neutral spine, slowly lower your leg all the way to the floor.   Repeat this exercise with each leg __________ times. Complete this exercise __________ times per day.     POSTURE AND BODY MECHANICS CONSIDERATIONS - Sciatica Keeping correct posture when sitting, standing or completing your activities will reduce the stress put on different body tissues, allowing injured tissues a chance to heal and limiting painful experiences. The following are general guidelines for improved posture. Your physician or physical therapist will provide you with any instructions specific to your needs. While reading these  guidelines, remember:  The exercises prescribed by your provider will help you have the flexibility and strength to maintain correct postures.  The correct posture provides the optimal environment for your joints to work. All of your joints have less wear and tear when properly supported by a spine with good posture. This means you will experience a healthier, less painful  body.  Correct posture must be practiced with all of your activities, especially prolonged sitting and standing.  Correct posture is as important when doing repetitive low-stress activities (typing) as it is when doing a single heavy-load activity (lifting).       RESTING POSITIONS Consider which positions are most painful for you when choosing a resting position. If you have pain with flexion-based activities (sitting, bending, stooping, squatting), choose a position that allows you to rest in a less flexed posture. You would want to avoid curling into a fetal position on your side. If your pain worsens with extension-based activities (prolonged standing, working overhead), avoid resting in an extended position such as sleeping on your stomach. Most people will find more comfort when they rest with their spine in a more neutral position, neither too rounded nor too arched. Lying on a non-sagging bed on your side with a pillow between your knees, or on your back with a pillow under your knees will often provide some relief. Keep in mind, being in any one position for a prolonged period of time, no matter how correct your posture, can still lead to stiffness.  PROPER SITTING POSTURE In order to minimize stress and discomfort on your spine, you must sit with correct posture Sitting with good posture should be effortless for a healthy body.  Returning to good posture is a gradual process. Many people can work toward this most comfortably by using various supports until they have the flexibility and strength to maintain this posture on  their own.   When sitting with proper posture, your ears will fall over your shoulders and your shoulders will fall over your hips. You should use the back of the chair to support your upper back. Your low back will be in a neutral position, just slightly arched. You may place a small pillow or folded towel at the base of your low back for support.    When working at a desk, create an environment that supports good, upright posture. Without extra support, muscles fatigue and lead to excessive strain on joints and other tissues.  Keep these recommendations in mind:  CHAIR:    A chair should be able to slide under your desk when your back makes contact with the back of the chair. This allows you to work closely.  The chair's height should allow your eyes to be level with the upper part of your monitor and your hands to be slightly lower than your elbows.  BODY POSITION  Your feet should make contact with the floor. If this is not possible, use a foot rest.  Keep your ears over your shoulders. This will reduce stress on your neck and low back.     INCORRECT SITTING POSTURES   If you are feeling tired and unable to assume a healthy sitting posture, do not slouch or slump. This puts excessive strain on your back tissues, causing more damage and pain. Healthier options include:  Using more support, like a lumbar pillow.  Switching tasks to something that requires you to be upright or walking.  Talking a brief walk.  Lying down to rest in a neutral-spine position.  PROLONGED STANDING WHILE SLIGHTLY LEANING FORWARD  When completing a task that requires you to lean forward while standing in one place for a long time, place either foot up on a stationary 2-4 inch high object to help maintain the best posture. When both feet are on the ground, the low back tends to lose its  slight inward curve.  If this curve flattens (or becomes too large), then the back and your other joints will experience  too much stress, fatigue more quickly and can cause pain.    CORRECT STANDING POSTURES Proper standing posture should be assumed with all daily activities, even if they only take a few moments, like when brushing your teeth. As in sitting, your ears should fall over your shoulders and your shoulders should fall over your hips. You should keep a slight tension in your abdominal muscles to brace your spine. Your tailbone should point down to the ground, not behind your body, resulting in an over-extended swayback posture.     INCORRECT STANDING POSTURES  Common incorrect standing postures include a forward head, locked knees and/or an excessive swayback.  WALKING Walk with an upright posture. Your ears, shoulders and hips should all line-up.  PROLONGED ACTIVITY IN A FLEXED POSITION When completing a task that requires you to bend forward at your waist or lean over a low surface, try to find a way to stabilize 3 of 4 of your limbs. You can place a hand or elbow on your thigh or rest a knee on the surface you are reaching across. This will provide you more stability so that your muscles do not fatigue as quickly. By keeping your knees relaxed, or slightly bent, you will also reduce stress across your low back.    CORRECT LIFTING TECHNIQUES DO :   Assume a wide stance. This will provide you more stability and the opportunity to get as close as possible to the object which you are lifting.  Tense your abdominals to brace your spine; then bend at the knees and hips. Keeping your back locked in a neutral-spine position, lift using your leg muscles. Lift with your legs, keeping your back straight.  Test the weight of unknown objects before attempting to lift them.  Try to keep your elbows locked down at your sides in order get the best strength from your shoulders when carrying an object.  Always ask for help when lifting heavy or awkward objects.  INCORRECT LIFTING TECHNIQUES DO NOT:   Lock  your knees when lifting, even if it is a small object.  Bend and twist. Pivot at your feet or move your feet when needing to change directions.  Assume that you cannot safely pick up a paperclip without proper posture.    Document Released: 07/25/2005  Document Re-Released: 08/16/2009 [facility] Patient Information 2011 [place], [place].   Lumbar Radiculopathy, Sciatica Sciatica is a weakness and/or changes in sensation (tingling, jolts, hot and cold, numbness) along the path the sciatic nerve travels. Irritation or damage to lumbar nerve roots is often also referred to as lumbar radiculopathy.  Lumbar radiculopathy (Sciatica) is the most common form of this problem. Radiculopathy can occur in any of the nerves coming out of the spinal cord. The problems caused depend on which nerves are involved. The sciatic nerve is the large nerve supplying the branches of nerves going from the hip to the toes. It often causes a numbness or weakness in the skin and/or muscles that the sciatic nerve serves. It also may cause symptoms (problems) of pain, burning, tingling, or electric shock-like feelings in the path of this nerve. This usually comes from injury to the fibers that make up the sciatic nerve. Some of these symptoms are low back pain and/or unpleasant feelings in the following areas:  From the mid-buttock down the back of the leg to the back  of the knee.   And/or the outside of the calf and top of the foot.   And/or behind the inner ankle to the sole of the foot.  CAUSES  Herniated or slipped disc. Discs are the little cushions between the bones in the back.   Pressure by the piriformis muscle in the buttock on the sciatic nerve (Piriformis Syndrome).   Misalignment of the bones in the lower back and buttocks (Sacroiliac Joint Derangement).   Narrowing of the spinal canal that puts pressure on or pinches the fibers that make up the sciatic nerve.   A slipped vertebra that is out of line with  those above or beneath it.   Abnormality of the nervous system itself so that nerve fibers do not transmit signals properly, especially to feet and calves (neuropathy).   Tumor (this is rare).  Your caregiver can usually determine the cause of your sciatica and begin the treatment most likely to help you. TREATMENT Taking over-the-counter painkillers, physical therapy, rest, exercise, spinal manipulation, and injections of anesthetics and/or steroids may be used. Surgery, acupuncture, and Yoga can also be effective. Mind over matter techniques, mental imagery, and changing factors such as your bed, chair, desk height, posture, and activities are other treatments that may be helpful. You and your caregiver can help determine what is best for you. With proper diagnosis, the cause of most sciatica can be identified and removed. Communication and cooperation between your caregiver and you is essential. If you are not successful immediately, do not be discouraged. With time, a proper treatment can be found that will make you comfortable. HOME CARE INSTRUCTIONS  If the pain is coming from a problem in the back, applying ice to that area for 10 minutes, 3 times per day while awake, may be helpful. Put the ice in a plastic bag. Place a towel between the bag of ice and your skin.   You may exercise or perform your usual activities if these do not aggravate your pain, or as suggested by your caregiver.   Only take over-the-counter or prescription medicines for pain, discomfort, or fever as directed by your caregiver.   If your caregiver has given you a follow-up appointment, it is very important to keep that appointment. Not keeping the appointment could result in a chronic or permanent injury, pain, and disability. If there is any problem keeping the appointment, you must call back to this facility for assistance.  SEEK IMMEDIATE MEDICAL CARE IF:  You experience loss of control of bowel or bladder.   You  have increasing weakness in the trunk, buttocks, or legs.   There is numbness in any areas from the hip down to the toes.   You have difficulty walking or keeping your balance.   You have any of the above, with fever or forceful vomiting.  Document Released: 07/19/2001 Document Re-Released: 08/16/2009 [facility] Patient Information 2011 [place], [place].

## 2011-05-22 DIAGNOSIS — M5430 Sciatica, unspecified side: Secondary | ICD-10-CM | POA: Insufficient documentation

## 2011-05-22 NOTE — Assessment & Plan Note (Signed)
Motrin 600mg  po q 6 hours prn, home exercises for sciatica, return in 2-3 weeks for recheck, or sooner if needed. Reviewed red flags that would warrant return.  Also need to f/up on chronic medical problems at next appt.

## 2011-05-22 NOTE — Progress Notes (Signed)
  Subjective:    Patient ID: [name], male    DOB: Dec 22, 1955, 55 y.o.   MRN: [id number]  HPI Left back pain and left leg pain: States that pain starts in left lower back and shoots down back of left leg like electricity/ "lightening".  Has had off and on x 1 year but has returned and has been particularly painful during the past 4 days.  Worse with weather changes, worse with walking, sitting doesn't make it worse or better.  No fever. No problems with bowel or bladder.  Has not taken anything OTC except for BC powder and this didn't seem to help.      Review of Systems As per above    Objective:   Physical Exam  Constitutional: He is oriented to person, place, and time.       obese  Abdominal:       Large ventral hernia, abrasions on skin of abdomen.  Musculoskeletal:       Back: + tenderness to palpation of left lower paraspinal muscles, no tenderness over spinous processes.  + straight leg raise on left leg.  Negative on right.  Normal sensation in lower legs bilateral. Strength 5/5 and equal bilateral in lower ext.  Reflexes equal and normal bilateral. Pain with ROM at torso in left lower back.   Neurological: He is alert and oriented to person, place, and time.  Psychiatric: He has a normal mood and affect. His behavior is normal.          Assessment & Plan:

## 2011-05-24 ENCOUNTER — Other Ambulatory Visit: Payer: Self-pay | Admitting: Family Medicine

## 2011-05-24 DIAGNOSIS — I4891 Unspecified atrial fibrillation: Secondary | ICD-10-CM

## 2011-05-24 MED ORDER — DILTIAZEM HCL ER BEADS 240 MG PO CP24: 240.0000 mg | ORAL_CAPSULE | Freq: Every day | ORAL | Status: DC

## 2011-05-24 MED ORDER — GABAPENTIN 300 MG PO CAPS: 300.0000 mg | ORAL_CAPSULE | Freq: Two times a day (BID) | ORAL | Status: DC

## 2011-05-30 ENCOUNTER — Encounter: Payer: Self-pay | Admitting: Family Medicine

## 2011-05-30 ENCOUNTER — Ambulatory Visit (INDEPENDENT_AMBULATORY_CARE_PROVIDER_SITE_OTHER): Payer: No Typology Code available for payment source | Admitting: Family Medicine

## 2011-05-30 VITALS — BP 165/97 | HR 67 | Temp 97.5°F | Ht 60.0 in | Wt 222.0 lb

## 2011-05-30 DIAGNOSIS — E119 Type 2 diabetes mellitus without complications: Secondary | ICD-10-CM

## 2011-05-30 DIAGNOSIS — Z23 Encounter for immunization: Secondary | ICD-10-CM

## 2011-05-30 DIAGNOSIS — F17200 Nicotine dependence, unspecified, uncomplicated: Secondary | ICD-10-CM

## 2011-05-30 DIAGNOSIS — F79 Unspecified intellectual disabilities: Secondary | ICD-10-CM

## 2011-05-30 DIAGNOSIS — E7800 Pure hypercholesterolemia, unspecified: Secondary | ICD-10-CM

## 2011-05-30 DIAGNOSIS — I10 Essential (primary) hypertension: Secondary | ICD-10-CM

## 2011-05-30 DIAGNOSIS — D509 Iron deficiency anemia, unspecified: Secondary | ICD-10-CM

## 2011-05-30 DIAGNOSIS — M5430 Sciatica, unspecified side: Secondary | ICD-10-CM

## 2011-05-30 LAB — POCT GLYCOSYLATED HEMOGLOBIN (HGB A1C): Hemoglobin A1C: 10.9

## 2011-05-30 MED ORDER — IBUPROFEN 200 MG PO TABS: 400.0000 mg | ORAL_TABLET | Freq: Four times a day (QID) | ORAL | Status: DC | PRN

## 2011-05-30 MED ORDER — LISINOPRIL 10 MG PO TABS: 10.0000 mg | ORAL_TABLET | Freq: Every day | ORAL | Status: DC

## 2011-05-30 NOTE — Patient Instructions (Addendum)
Back pain: Take motrin as directed for pain. Work on home exercises- it is important that you find time to do this.   Diabetes: You need to get blood glucose strips:   Call the [facility] [place], [phone] Check blood glucose 1 x day.   HTN: Check to see if you are taking Lisinopril (prinvil, zestril)- if you don't have this at home let me know and I will call this in.   Retrun in 2-4 weeks for recheck.

## 2011-06-02 ENCOUNTER — Other Ambulatory Visit: Payer: Self-pay | Admitting: Family Medicine

## 2011-06-02 MED ORDER — GABAPENTIN 300 MG PO CAPS: 300.0000 mg | ORAL_CAPSULE | Freq: Two times a day (BID) | ORAL | Status: DC

## 2011-06-02 MED ORDER — PAROXETINE HCL 40 MG PO TABS: 40.0000 mg | ORAL_TABLET | Freq: Every day | ORAL | Status: DC

## 2011-06-02 MED ORDER — METFORMIN HCL 1000 MG PO TABS: 1000.0000 mg | ORAL_TABLET | Freq: Two times a day (BID) | ORAL | Status: DC

## 2011-06-02 MED ORDER — DILTIAZEM HCL ER BEADS 240 MG PO CP24: 240.0000 mg | ORAL_CAPSULE | Freq: Every day | ORAL | Status: DC

## 2011-06-02 NOTE — Assessment & Plan Note (Addendum)
Pt not monitoring blood glucose levels, doesn't have strips.  States that he takes his insulin daily, rarely misses.  Pt to call [facility] program to see if he qualifies for low cost glucometer strips.  If he doesn't qualify he is to find out how much his copay will be for strips through medicare and try to find financial resources to purchase these strips. Unable to titrate insulin levels since I do not have this information.  Niece present for this conversation.

## 2011-06-02 NOTE — Progress Notes (Signed)
  Subjective:    Patient ID: [name], male    DOB: Jul 23, 1956, 56 y.o.   MRN: [id number]  HPI Pt here for back pain/sciatica f/up: Not improved, ran out of motrin, has not been doing back exercises.  States he is afraid to get down on the ground, b/c he is afraid that he won't be able to get back up.    DM: Not checking blood sugars, states he doesn't have blood glucose strips, endorses some numbness in fingers and feet, eye exam 2 years ago, states that he takes lantus 40units daily and that he very rarely forgets to take this. Injects in arms.    Review of Systems As per above. No cp. No dizziness. No falls.     Objective:   Physical Exam  Constitutional: He is oriented to person, place, and time. He appears well-developed and well-nourished.       obese  HENT:  Head: Normocephalic and atraumatic.  Cardiovascular: Normal rate, regular rhythm and normal heart sounds.   No murmur heard. Pulmonary/Chest: Breath sounds normal. No respiratory distress.  Abdominal:       Large ventral hernia, abrasions on skin of abdomen.  Musculoskeletal: Normal range of motion.       Back: + tenderness to palpation of left lower paraspinal muscles, no tenderness over spinous processes.  + straight leg raise on left leg.  Negative on right.  Normal sensation in lower legs bilateral. Strength 5/5 and equal bilateral in lower ext.  Reflexes equal and normal bilateral. Pain with ROM at torso in left lower back.   Neurological: He is alert and oriented to person, place, and time.  Skin: No rash noted.  Psychiatric: He has a normal mood and affect. His behavior is normal.          Assessment & Plan:

## 2011-06-08 NOTE — Assessment & Plan Note (Signed)
Encouraged smoking cessation. We'll discuss further at next appointment.

## 2011-06-08 NOTE — Assessment & Plan Note (Signed)
Patient due for recheck of lipid panel-at next appointment we'll schedule fasting lipid panel

## 2011-06-08 NOTE — Assessment & Plan Note (Signed)
Was 9.1 in July 2012-need to recheck and do further workup at future appointment

## 2011-06-08 NOTE — Assessment & Plan Note (Signed)
Patient has not taken Motrin as prescribed, is not doing home exercises, is not feeling better. Reviewed treatment plan. Offered to send him to formal physical therapy. Patient states that transportation is barrier to this.

## 2011-06-08 NOTE — Assessment & Plan Note (Signed)
Blood pressure 165/97 at today's visit. Patient has been missing doses of lisinopril-Rx refilled. The patient more education on importance of this medication. We'll recheck creatinine at next appointment.

## 2011-06-08 NOTE — Assessment & Plan Note (Signed)
Discuss with the patient's niece the possibility of enrolling patient into the [facility] program. Patient has always been adamant to family but he does not want to go to a nursing home. We'll discuss further at future appointment. Need to do a better assessment on patient home function.

## 2011-06-13 ENCOUNTER — Ambulatory Visit (INDEPENDENT_AMBULATORY_CARE_PROVIDER_SITE_OTHER): Payer: No Typology Code available for payment source | Admitting: Family Medicine

## 2011-06-13 ENCOUNTER — Encounter: Payer: Self-pay | Admitting: Family Medicine

## 2011-06-13 ENCOUNTER — Other Ambulatory Visit: Payer: Self-pay

## 2011-06-13 ENCOUNTER — Ambulatory Visit (HOSPITAL_COMMUNITY)
Admission: RE | Admit: 2011-06-13 | Discharge: 2011-06-13 | Disposition: A | Discharge status: Home / Self Care | Payer: No Typology Code available for payment source | Source: Ambulatory Visit | Attending: Interventional Cardiology | Admitting: Interventional Cardiology

## 2011-06-13 DIAGNOSIS — D509 Iron deficiency anemia, unspecified: Secondary | ICD-10-CM

## 2011-06-13 DIAGNOSIS — I10 Essential (primary) hypertension: Secondary | ICD-10-CM

## 2011-06-13 DIAGNOSIS — R079 Chest pain, unspecified: Secondary | ICD-10-CM | POA: Insufficient documentation

## 2011-06-13 DIAGNOSIS — E119 Type 2 diabetes mellitus without complications: Secondary | ICD-10-CM

## 2011-06-13 DIAGNOSIS — F17200 Nicotine dependence, unspecified, uncomplicated: Secondary | ICD-10-CM

## 2011-06-13 DIAGNOSIS — E7800 Pure hypercholesterolemia, unspecified: Secondary | ICD-10-CM

## 2011-06-13 LAB — COMPREHENSIVE METABOLIC PANEL
ALT: 16 U/L (ref 0–53)
AST: 20 U/L (ref 0–37)
Albumin: 4.2 g/dL (ref 3.5–5.2)
Alkaline Phosphatase: 80 U/L (ref 39–117)
Potassium: 5 mEq/L (ref 3.5–5.3)
Sodium: 139 mEq/L (ref 135–145)
Total Protein: 7.3 g/dL (ref 6.0–8.3)

## 2011-06-13 NOTE — Progress Notes (Signed)
  Subjective:    Patient ID: [name], male    DOB: 07-11-1956, 55 y.o.   MRN: [id number]  HPI Chest pain: Occurred last night. Worse with deep breath. Took 2 nitroglycerin with minimal improvement. Located in left chest. No substernal chest pain. Not improved with rest. Not worsened with walking. No nausea vomiting. No diaphoresis. Pain occurs with sneezing and a cough. No pain radiation. Chest pain now resolved.  Diabetes: Last A1c 10.9 in October 2012. Patient has not in taking blood sugars. States he cannot afford blood glucose strips.  Hypertension: Blood pressure much better controlled on current regimen. Patient states he takes all blood pressure medications daily. Blood pressure 132/81 today.  Social history: Patient is living in a hotel that his sister works on the weekends. She has friends that check on him. They are concerned since patient does have some mental retardation. States they have not visited the [facility] program but are considering this.     Review of Systems As per above. No dizziness. No falls. No back pain. Chest pain now resolved. No shortness of breath.    Objective:   Physical Exam  Constitutional: He is oriented to person, place, and time. He appears well-developed and well-nourished.       Obese  HENT:  Head: Normocephalic and atraumatic.  Cardiovascular: Normal rate, regular rhythm and normal heart sounds.   No murmur heard. Pulmonary/Chest: Effort normal and breath sounds normal. No respiratory distress.       Positive tenderness on palpation of left lower rib area.  Musculoskeletal: He exhibits no edema.  Neurological: He is alert and oriented to person, place, and time.  Psychiatric: He has a normal mood and affect. His behavior is normal.          Assessment & Plan:

## 2011-06-13 NOTE — Patient Instructions (Signed)
Chest pain: Most likely this pain is due to a muscle strain.  Take tylenol as needed for this pain.  If chest pain returns, seems worse with walking, is relieved by rest these things may indicate a heart problem.  Return to care. Return in 2-3 weeks for recheck or sooner if new or worsening of symptoms.   Diabetes: Get BG strips.  Check BG daily in the morning before eating.  Return in 2-3 weeks with Blood glucose logbook so we can titrate up lantus.  Hypertension: Your Blood pressure is much better!!!  Continue to take your Blood pressure medications.

## 2011-06-16 ENCOUNTER — Encounter: Payer: Self-pay | Admitting: Family Medicine

## 2011-06-16 DIAGNOSIS — R079 Chest pain, unspecified: Secondary | ICD-10-CM | POA: Insufficient documentation

## 2011-06-16 NOTE — Assessment & Plan Note (Signed)
Patient states he has been tobacco free since February 2012.

## 2011-06-16 NOTE — Assessment & Plan Note (Signed)
Obtained lipid panel at today's appointment. We'll send results to patient and a letter.

## 2011-06-16 NOTE — Assessment & Plan Note (Signed)
Patient states he is taking his Lantus daily and he rarely forgets it. Patient is not checking blood sugars. Still does not have strips. Spoke with the patient's sister who was with him today at the visit. She states that she'll make sure he gets blood sugar strips so that he can check his blood sugar and we can titrate up on his insulin to better control his diabetes.

## 2011-06-16 NOTE — Assessment & Plan Note (Signed)
Blood pressure much improved today's visit- 132/81. Patient states he is taking all his medications as directed. The addition of lisinopril seems to be helping. Patient to return in 2-4 weeks for followup.

## 2011-06-16 NOTE — Assessment & Plan Note (Signed)
Was 12.1 in July 2012-issue resolved.

## 2011-07-07 ENCOUNTER — Other Ambulatory Visit: Payer: Self-pay | Admitting: Family Medicine

## 2011-07-08 ENCOUNTER — Ambulatory Visit (INDEPENDENT_AMBULATORY_CARE_PROVIDER_SITE_OTHER): Payer: No Typology Code available for payment source | Admitting: Family Medicine

## 2011-07-08 ENCOUNTER — Encounter: Payer: Self-pay | Admitting: Family Medicine

## 2011-07-08 VITALS — BP 130/80 | HR 72 | Ht 60.0 in | Wt 218.0 lb

## 2011-07-08 DIAGNOSIS — K439 Ventral hernia without obstruction or gangrene: Secondary | ICD-10-CM

## 2011-07-08 DIAGNOSIS — E119 Type 2 diabetes mellitus without complications: Secondary | ICD-10-CM

## 2011-07-08 DIAGNOSIS — I10 Essential (primary) hypertension: Secondary | ICD-10-CM

## 2011-07-08 DIAGNOSIS — M5430 Sciatica, unspecified side: Secondary | ICD-10-CM

## 2011-07-08 DIAGNOSIS — F411 Generalized anxiety disorder: Secondary | ICD-10-CM

## 2011-07-08 DIAGNOSIS — K651 Peritoneal abscess: Secondary | ICD-10-CM

## 2011-07-08 DIAGNOSIS — IMO0002 Reserved for concepts with insufficient information to code with codable children: Secondary | ICD-10-CM

## 2011-07-08 MED ORDER — DABIGATRAN ETEXILATE MESYLATE 150 MG PO CAPS: 150.0000 mg | ORAL_CAPSULE | Freq: Two times a day (BID) | ORAL | Status: DC

## 2011-07-08 MED ORDER — ONETOUCH ULTRASOFT LANCETS MISC: Status: DC

## 2011-07-08 MED ORDER — ATORVASTATIN CALCIUM 40 MG PO TABS: 40.0000 mg | ORAL_TABLET | Freq: Every day | ORAL | Status: DC

## 2011-07-08 MED ORDER — PAROXETINE HCL 40 MG PO TABS: 40.0000 mg | ORAL_TABLET | Freq: Every day | ORAL | Status: DC

## 2011-07-08 MED ORDER — CLONAZEPAM 0.5 MG PO TABS: 0.5000 mg | ORAL_TABLET | Freq: Every evening | ORAL | Status: DC | PRN

## 2011-07-08 NOTE — Assessment & Plan Note (Signed)
Chronic back pain off and on. Patient to use home exercises. And Motrin as needed.

## 2011-07-08 NOTE — Assessment & Plan Note (Signed)
Patient states that he was told by surgery that his large ventral hernia is not operable due to his chronic medical problems. Told patient that if we can get his blood sugar controlled and he continues to use not use tobacco products, but I would be open to see him back to surgery for a second opinion. This would only be appropriate if we get better control of his chronic medical problems.

## 2011-07-08 NOTE — Telephone Encounter (Signed)
Refill request

## 2011-07-08 NOTE — Assessment & Plan Note (Signed)
Well-controlled on current regimen. ?

## 2011-07-08 NOTE — Assessment & Plan Note (Signed)
Per patient well controlled on Klonopin 0.5 mg at bedtime and paroxetine 40 mg daily. We'll continue this as prescribed by previous PCP.

## 2011-07-08 NOTE — Assessment & Plan Note (Signed)
Patient not checking blood sugars. Patient goal was to check daily for 2 weeks. Discussed the importance of checking blood sugars so that I can titrate up insulin to get better control. Patient to go by strips-family member with patient today states that they will help with this. Patient to return in 2-3 weeks for titration of insulin. Patient to bring blood glucose log book.

## 2011-07-08 NOTE — Progress Notes (Signed)
  Subjective:    Patient ID: [name], male    DOB: 1955/12/09, 55 y.o.   MRN: [id number]  HPI Patient presents to clinic for followup on medical problems.  Back pain: Patient expresses concern of back pain that has been occurring for years but has increased recently. It is located on the right lateral side of his lower back it occurs mainly with walking. It is a sharp pain that radiates to the upper back. Usually lasts about 2 days then resolves on its own. It is worse with any activity. It is better with rest. States when it occurs it is a 10 out of 10. His sister who is present with him in the room states he complains a lot of this pain. He would like pain medication for this problem if possible. No changes in bowel or bladder. No fever  Anxiety: Patient states his anxiety is well controlled under Clonazepam and and Paroxetine. He takes both of these medications daily for his nerves. Klonopin as prescribed by a which ever Dr. he sees here at clinic. Would like refills on these medications. Has been on these medications chronically.-Last prescribed by Dr. Janalyn Harder  Hypertension: Patient's blood pressure is well controlled at 130/80 in the office. He does not check it at home. Patient states he is not really watching his salt intake at home. I explained the importance of doing so as well as a well-balanced diet.  Diabetes: Patient has not been checking his blood sugars. States he checks them 2 times in the past month. He checked it the other day. It was 168 fasting. He states he is giving himself 40 units of Lantus daily at bedtime. For the past 2 days he has not taken his insulin. This is because he has not had food. This does not happen often per sister. Does complain of tachycardia and dizziness at times. States he sits or 30 minutes and it goes away.  Patient's sisters states she has not had the opportunity to contact [facility] yet. States she will do so soon.  Patient states he is compliant  with all of his medications. Needs refill on the following: Lipitor, Klonopin, Pradaxa and Paxil.   Review of Systems Complains of back pain. Also complains of tachycardia and dizziness at times. Denies any fever, chills, nausea, vomiting, diarrhea and shortness of breath.     Objective:   Physical Exam  General: Alert and oriented x3. Well-nourished. In no acute distress. Cardiovascular: Regular rate and rhythm. No murmurs, rubs or gallops. Pulmonary: Clear to auscultation bilaterally. No wheezes, rhonchi or rales. Skin: Patient has erythematous ulcers ranging from 1-4 cm dispersed throughout entire body. Appear to be nonhealing. Ulcers appeared noninfectious. Neurologic: Sensation intact bilaterally on arms as well as bilaterally on legs. Sensitive to pinprick on all 4 extremities.       Assessment & Plan:

## 2011-07-08 NOTE — Assessment & Plan Note (Deleted)
Patient states that he was told by surgery that his large ventral hernia is not operable due to his chronic medical problems. Told patient that if we can get his blood sugar controlled and he continues to use not use tobacco products, but I would be open to see him back to surgery for a second opinion. This would only be appropriate if we get better control of his chronic medical problems. 

## 2011-07-19 ENCOUNTER — Encounter: Payer: Self-pay | Admitting: Vascular Surgery

## 2011-07-20 ENCOUNTER — Ambulatory Visit: Payer: Self-pay | Admitting: Vascular Surgery

## 2011-07-20 ENCOUNTER — Other Ambulatory Visit: Payer: Self-pay

## 2011-07-25 ENCOUNTER — Ambulatory Visit: Payer: No Typology Code available for payment source | Admitting: Family Medicine

## 2011-07-27 ENCOUNTER — Other Ambulatory Visit: Payer: Self-pay | Admitting: Family Medicine

## 2011-07-27 DIAGNOSIS — I4891 Unspecified atrial fibrillation: Secondary | ICD-10-CM

## 2011-07-27 DIAGNOSIS — E119 Type 2 diabetes mellitus without complications: Secondary | ICD-10-CM

## 2011-07-27 DIAGNOSIS — F411 Generalized anxiety disorder: Secondary | ICD-10-CM

## 2011-07-27 MED ORDER — LANTUS SOLOSTAR 100 UNIT/ML ~~LOC~~ SOPN: 40.0000 [IU] | PEN_INJECTOR | Freq: Every day | SUBCUTANEOUS | Status: DC

## 2011-08-30 ENCOUNTER — Encounter: Payer: Self-pay | Admitting: Vascular Surgery

## 2011-08-31 ENCOUNTER — Encounter: Payer: Self-pay | Admitting: Vascular Surgery

## 2011-08-31 ENCOUNTER — Ambulatory Visit (INDEPENDENT_AMBULATORY_CARE_PROVIDER_SITE_OTHER): Payer: No Typology Code available for payment source | Admitting: Vascular Surgery

## 2011-08-31 ENCOUNTER — Ambulatory Visit (INDEPENDENT_AMBULATORY_CARE_PROVIDER_SITE_OTHER): Payer: No Typology Code available for payment source | Admitting: *Deleted

## 2011-08-31 VITALS — BP 142/82 | HR 66 | Resp 16 | Ht 61.0 in | Wt 225.0 lb

## 2011-08-31 DIAGNOSIS — I639 Cerebral infarction, unspecified: Secondary | ICD-10-CM

## 2011-08-31 DIAGNOSIS — I6350 Cerebral infarction due to unspecified occlusion or stenosis of unspecified cerebral artery: Secondary | ICD-10-CM

## 2011-08-31 DIAGNOSIS — I6529 Occlusion and stenosis of unspecified carotid artery: Secondary | ICD-10-CM

## 2011-08-31 NOTE — Assessment & Plan Note (Signed)
The carotid duplex scan shows a 40-59% left carotid stenosis with no significant stenosis on the right. Thus there is been no change over the last year. In addition he has remained asymptomatic. He is on aspirin which he knows to continue. I've ordered a follow carotid duplex scan in 1 year and I'll see him back at that time. He knows to call sooner if he has problems.

## 2011-08-31 NOTE — Progress Notes (Signed)
[facility]  Patient name: [name] MRN: [id number] DOB: Mar 24, 1956 Sex: male  REASON FOR VISIT: follow up of carotid disease  HPI: [name] is a 56 y.o. male who I seen in consultation in the hospital on 09/17/2010 when he had bilateral strokes by MRI. The CT angiogram and carotid duplex scan that admission showed different results. The carotid duplex scan showed no significant carotid stenosis on the right with a 60-79% left carotid stenosis. However, the CT angiogram showed a 60-79% right carotid stenosis with a less than 60% left carotid stenosis. In order to sort this out, he underwent a cerebral arteriogram on to 1312. On the right side he had approximately 30% internal carotid artery stenosis. He had an approximately 40% left internal carotid artery stenosis. These blockages were fairly smooth. He now returns for a one year follow up visit.  Since I saw him in the hospital he's had no new strokes. He's had no TIAs, expressive or receptive aphasia, or amaurosis fugax.  Past Medical History  Diagnosis Date  . Hypertension   . Diabetes mellitus   . Mental retardation     Sister helps to take care of him and takes him to appts  . Tobacco user     Smokes 1ppd for multiple years.  Quit after hosp 09/2010.  Marland Kitchen Anemia     History of Iron Def Anemia  . Anxiety   . Hyperlipidemia   . Atrial fibrillation 06/2009    CHADS score 2 (HTN, DM), was not on coumadin, but now on Pradaxa for Afib  . Lung nodule   . Chronic low back pain   . CVA (cerebral infarction) 09/2010    Bilateral with Left > Right  . Abdominal hernia     Chronic, not a good surgical candidate  . Carotid artery occlusion   . Obesity   . COPD (chronic obstructive pulmonary disease)     Family History  Problem Relation Age of Onset  . Heart disease Mother   . Hypertension Sister   . Heart disease Sister   . Heart disease Brother   . Heart disease Father   . Peripheral  vascular disease Father   . Heart disease Maternal Grandmother   . Heart attack Maternal Grandmother     SOCIAL HISTORY: History  Substance Use Topics  . Smoking status: Former Smoker -- 1.0 packs/day for 42 years    Types: Cigarettes  . Smokeless tobacco: Former Neurosurgeon    Quit date: 10/02/2010  . Alcohol Use: No    Allergies  Allergen Reactions  . Penicillins     Current Outpatient Prescriptions  Medication Sig Dispense Refill  . aspirin 81 MG EC tablet Take 81 mg by mouth daily.        Marland Kitchen atorvastatin (LIPITOR) 40 MG tablet Take 1 tablet (40 mg total) by mouth at bedtime.  30 tablet  11  . clonazePAM (KLONOPIN) 0.5 MG tablet Take 1 tablet (0.5 mg total) by mouth at bedtime as needed for anxiety.  30 tablet  5  . dabigatran (PRADAXA) 150 MG CAPS Take 1 capsule (150 mg total) by mouth every 12 (twelve) hours.  60 capsule  6  . diltiazem (TIAZAC) 240 MG 24 hr capsule Take 1 capsule (240 mg total) by mouth daily. For heart rate  30 capsule  6  . furosemide (LASIX) 40 MG tablet Take 1 tablet (40 mg total) by mouth daily.  30 tablet  11  .  gabapentin (NEURONTIN) 300 MG capsule Take 1 capsule (300 mg total) by mouth 2 (two) times daily.  62 capsule  6  . glucose blood (ONE TOUCH ULTRA TEST) test strip Check sugar every morning before breakfast and before dinner.  100 each  11  . ibuprofen (MOTRIN IB) 200 MG tablet Take 2 tablets (400 mg total) by mouth every 6 (six) hours as needed for pain.  90 tablet  2  . INS SYRINGE/NEEDLE 1CC/28G (B-D INS SYR MICROFINE 1CC/28G) 28G X 1/2" 1 ML MISC (indulin syringe-needle U-100) Dispense QS for 1 month supply wit daily dosing. dispense 1 box.       . Insulin Pen Needle (B-D UF III MINI PEN NEEDLES) 31G X 5 MM MISC Inject 28 Units into the skin at bedtime. Use as directed  100 each  6  . Lancets (ONETOUCH ULTRASOFT) lancets Check your sugar before breakfast and before dinner.  100 each  11  . LANTUS SOLOSTAR 100 UNIT/ML injection Inject 40 Units into  the skin at bedtime.  3 mL  8  . lisinopril (PRINIVIL,ZESTRIL) 10 MG tablet Take 1 tablet (10 mg total) by mouth daily. For blood presure  90 tablet  4  . metFORMIN (GLUCOPHAGE) 1000 MG tablet Take 1 tablet (1,000 mg total) by mouth 2 (two) times daily with a meal. one tablet twice daily prior to meals for diabetes  60 tablet  6  . metoprolol tartrate (LOPRESSOR) 25 MG tablet Take 1 tablet (25 mg total) by mouth 2 (two) times daily.  60 tablet  11  . nitroGLYCERIN (NITROSTAT) 0.4 MG SL tablet place 1 on tongue when you have chest pain. If after the 2nd one you still have chest pain, go to ER       . PARoxetine (PAXIL) 40 MG tablet Take 1 tablet (40 mg total) by mouth daily. For nerves  30 tablet  11  . triamcinolone (KENALOG) 0.1 % ointment apply to both legs three times a day for rash and itchiness.  Dispense for both legs.         REVIEW OF SYSTEMS: Arly.Keller ] denotes positive finding; [  ] denotes negative finding  CARDIOVASCULAR:  Arly.Keller ] chest pain this has been stable and is relieved with nitroglycerin. He is followed in the family practice clinic  [ ]  chest pressure   [ ]  palpitations   Arly.Keller ] orthopnea   Arly.Keller ] dyspnea on exertion   [ ]  claudication   [ ]  rest pain   [ ]  DVT   [ ]  phlebitis PULMONARY:   [ ]  productive cough   [ ]  asthma   [ ]  wheezing NEUROLOGIC:   [ ]  weakness  [ ]  paresthesias  [ ]  aphasia  [ ]  amaurosis  [ ]  dizziness HEMATOLOGIC:   [ ]  bleeding problems   [ ]  clotting disorders MUSCULOSKELETAL:  [ ]  joint pain   [ ]  joint swelling [ ]  leg swelling GASTROINTESTINAL: [ ]   blood in stool  [ ]   hematemesis GENITOURINARY:  [ ]   dysuria  [ ]   hematuria PSYCHIATRIC:  [ ]  history of major depression INTEGUMENTARY:  [ ]  rashes  [ ]  ulcers CONSTITUTIONAL:  [ ]  fever   [ ]  chills  PHYSICAL EXAM: Filed Vitals:   08/31/11 1605  BP: 142/82  Pulse: 66  Resp: 16  Height: 5\' 1"  (1.549 m)  Weight: 225 lb (102.059 kg)   Body mass index is 42.51 kg/(m^2). GENERAL: The patient is a  well-nourished male, in no acute distress. The vital signs are documented above. CARDIOVASCULAR: There is a regular rate and rhythm without significant murmur appreciated. I do not detect carotid bruits. PULMONARY: There is good air exchange bilaterally without wheezing or rales. ABDOMEN: Soft and non-tender with normal pitched bowel sounds.  MUSCULOSKELETAL: There are no major deformities or cyanosis. NEUROLOGIC: No focal weakness or paresthesias are detected. SKIN: There are no ulcers or rashes noted. PSYCHIATRIC: The patient has a normal affect.  DATA:  I have independently interpreted his carotid duplex scan. This shows a less than 39% right carotid stenosis with a 40-59% left carotid stenosis. The left vertebral artery is not visualized the right vertebral artery has antegrade flow. Arm pressures are essentially equal.  MEDICAL ISSUES:  Occlusion and stenosis of carotid artery without mention of cerebral infarction The carotid duplex scan shows a 40-59% left carotid stenosis with no significant stenosis on the right. Thus there is been no change over the last year. In addition he has remained asymptomatic. He is on aspirin which he knows to continue. I've ordered a follow carotid duplex scan in 1 year and I'll see him back at that time. He knows to call sooner if he has problems.    Motty Borin S [facility] Beeper: [phone]

## 2011-09-07 NOTE — Procedures (Unsigned)
CAROTID DUPLEX EXAM  INDICATION:  CVA.  HISTORY: Diabetes:  Yes. Cardiac:  No. Hypertension:  Yes. Smoking:  Previous. Previous Surgery:  No. CV History:  History of CVA in 2012. Amaurosis Fugax No, Paresthesias No, Hemiparesis No.                                      RIGHT             LEFT Brachial systolic pressure:         102               116 Brachial Doppler waveforms:         Normal            Normal Vertebral direction of flow:        Antegrade         Not visualized DUPLEX VELOCITIES (cm/sec) CCA peak systolic                   52                77 ECA peak systolic                   69                130 ICA peak systolic                   109               187 ICA end diastolic                   33                51 PLAQUE MORPHOLOGY:                  Heterogenous      Mixed PLAQUE AMOUNT:                      Mild              Moderate PLAQUE LOCATION:                    ICA/CCA           ICA/ECA/CCA  IMPRESSION: 1. Doppler velocities suggest 1% to 39% stenosis of the right internal     carotid artery and 40% to 59% stenosis of the left proximal     internal carotid artery. 2. The left vertebral artery was not adequately visualized.  ___________________________________________ Di Kindle. Edilia Bo, M.D.  CH/MEDQ  D:  09/01/2011  T:  09/01/2011  Job:  [id number]

## 2011-11-01 ENCOUNTER — Other Ambulatory Visit: Payer: Self-pay | Admitting: Family Medicine

## 2011-11-23 ENCOUNTER — Telehealth: Payer: Self-pay | Admitting: Family Medicine

## 2011-11-23 ENCOUNTER — Other Ambulatory Visit: Payer: Self-pay | Admitting: Family Medicine

## 2011-11-23 MED ORDER — INSULIN PEN NEEDLE 31G X 5 MM MISC: 28.0000 [IU] | Freq: Every day | Status: DC

## 2011-11-23 MED ORDER — FUROSEMIDE 40 MG PO TABS: 40.0000 mg | ORAL_TABLET | Freq: Every day | ORAL | Status: DC

## 2011-11-23 NOTE — Telephone Encounter (Signed)
error 

## 2011-11-24 ENCOUNTER — Encounter: Payer: Self-pay | Admitting: Family Medicine

## 2011-12-08 ENCOUNTER — Ambulatory Visit (INDEPENDENT_AMBULATORY_CARE_PROVIDER_SITE_OTHER): Payer: No Typology Code available for payment source | Admitting: Family Medicine

## 2011-12-08 DIAGNOSIS — E119 Type 2 diabetes mellitus without complications: Secondary | ICD-10-CM

## 2011-12-08 DIAGNOSIS — I6529 Occlusion and stenosis of unspecified carotid artery: Secondary | ICD-10-CM

## 2011-12-08 DIAGNOSIS — I10 Essential (primary) hypertension: Secondary | ICD-10-CM

## 2011-12-08 DIAGNOSIS — R29898 Other symptoms and signs involving the musculoskeletal system: Secondary | ICD-10-CM

## 2011-12-08 NOTE — Patient Instructions (Addendum)
Check your blood sugar 1 x per day. I day check in the morning and the next day check at bedtime.   Write down blood sugars in a book.   Return in 2-3 weeks

## 2011-12-08 NOTE — Progress Notes (Signed)
  Subjective:    Patient ID: [name], male    DOB: 09-18-1955, 56 y.o.   MRN: [id number]  HPI  Bp: Taking bp pills daily.  107/71.   Occasional dizziness with bending over.   No syncope.  No vision changes.  States he is tolerating medications well.  No checking bp at home.   Diabetes: Checking Blood sugar 3 x per week.  Has blood sugar strips and glucometer now.  Doesn't keep track of bg numbers.  Doesn't remember what they have been running.   Forgets lantus about 2 x per week.   Leg weakness: Sometimes has weakness in legs after activity.  No falls. No leg cramps.  Sometime right leg gives out.  No joint swelling.  No joint redness.  Doesn't happen with all activity- sometimes can walk without any problems or pain in legs for long distances.    Review of Systems As per above.     Objective:   Physical Exam  Constitutional: He appears well-developed and well-nourished.  HENT:  Head: Normocephalic and atraumatic.  Cardiovascular: Normal rate, regular rhythm and normal heart sounds.   No murmur heard. Pulmonary/Chest: Effort normal. No respiratory distress. He has no wheezes.  Abdominal: Soft. There is no tenderness.       Large pannus, + large right sided abdomen hernia.  Musculoskeletal: He exhibits no edema and no tenderness.       Normal rom in legs bilateral, no edema in lower ext, no decrease in sensation in legs, good strength bilateral in lower ext.   Neurological: He is alert.  Skin: Skin is warm. No rash noted.  Psychiatric: He has a normal mood and affect.          Assessment & Plan:

## 2011-12-10 ENCOUNTER — Encounter: Payer: Self-pay | Admitting: Family Medicine

## 2011-12-10 DIAGNOSIS — R29898 Other symptoms and signs involving the musculoskeletal system: Secondary | ICD-10-CM | POA: Insufficient documentation

## 2011-12-10 NOTE — Assessment & Plan Note (Addendum)
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  Per hpi- story not typical for claudication- doesn't occur consistently.  Doesn't seem to impair patients function or ability to walk where he needs to go.  No falls.  Will monitor symptoms for now.  If pt continues to have symptoms will consider doing ABI.  Pt to return in 2 weeks for recheck.

## 2011-12-10 NOTE — Assessment & Plan Note (Signed)
A1C worsening went from 10.5 to 10.9.   Pt missing 2 injections of insulin per week he states.  I have not been able to titrate up on insulin since pt doesn't consistently check bg at home.  Pt came in today for recheck because I called and asked him to return for follow up.  Pt now has glucometer and blood glucose strips.  Instructed pt to check bg at least 1 x per day and write down in logbook.  Pt to return in 2 weeks for diabetes recheck.  Need to review nutrition at future appointment.  Also need to review other health measures- diabetes check list with patient.

## 2011-12-10 NOTE — Assessment & Plan Note (Addendum)
Per Dr. Edilia Bo 08/2011 note: "The carotid duplex scan shows a 40-59% left carotid stenosis with no significant stenosis on the right. Thus there is been no change over the last year. In addition he has remained asymptomatic. He is on aspirin which he knows to continue. I've ordered a follow carotid duplex scan in 1 year and I'll see him back at that time. He knows to call sooner if he has problems."

## 2011-12-10 NOTE — Assessment & Plan Note (Signed)
Blood pressure well controlled.  Pt states he is compliant with medications. bp 107/71 today.

## 2012-01-06 ENCOUNTER — Other Ambulatory Visit: Payer: Self-pay | Admitting: Family Medicine

## 2012-01-06 ENCOUNTER — Other Ambulatory Visit: Payer: Self-pay | Admitting: *Deleted

## 2012-01-06 DIAGNOSIS — F411 Generalized anxiety disorder: Secondary | ICD-10-CM

## 2012-01-09 ENCOUNTER — Other Ambulatory Visit: Payer: Self-pay | Admitting: Family Medicine

## 2012-01-09 MED ORDER — METOPROLOL TARTRATE 25 MG PO TABS: 25.0000 mg | ORAL_TABLET | Freq: Two times a day (BID) | ORAL | Status: DC

## 2012-01-09 MED ORDER — CLONAZEPAM 0.5 MG PO TABS: 0.5000 mg | ORAL_TABLET | Freq: Every evening | ORAL | Status: DC | PRN

## 2012-02-11 ENCOUNTER — Other Ambulatory Visit: Payer: Self-pay | Admitting: Family Medicine

## 2012-02-13 NOTE — Telephone Encounter (Signed)
Refill given for two months worth of Pradaxa. Would like to see pt back within the next month to discuss chronic medical problems. Recent A1c was 10.5 and pt was to return within 2-3 weeks; however no appointment is scheduled. Will send pt letter asking him to make appt with me.

## 2012-03-12 ENCOUNTER — Other Ambulatory Visit: Payer: Self-pay | Admitting: *Deleted

## 2012-03-14 MED ORDER — FUROSEMIDE 40 MG PO TABS: 40.0000 mg | ORAL_TABLET | Freq: Every day | ORAL | Status: DC

## 2012-04-10 ENCOUNTER — Other Ambulatory Visit: Payer: Self-pay | Admitting: Family Medicine

## 2012-04-12 ENCOUNTER — Other Ambulatory Visit: Payer: Self-pay | Admitting: Family Medicine

## 2012-04-12 DIAGNOSIS — F411 Generalized anxiety disorder: Secondary | ICD-10-CM

## 2012-04-12 MED ORDER — CLONAZEPAM 0.5 MG PO TABS: 0.5000 mg | ORAL_TABLET | Freq: Every evening | ORAL | Status: DC | PRN

## 2012-04-13 ENCOUNTER — Other Ambulatory Visit: Payer: Self-pay | Admitting: Family Medicine

## 2012-04-20 ENCOUNTER — Ambulatory Visit: Payer: No Typology Code available for payment source | Admitting: Family Medicine

## 2012-05-07 ENCOUNTER — Other Ambulatory Visit: Payer: Self-pay | Admitting: Family Medicine

## 2012-05-08 NOTE — Telephone Encounter (Signed)
Must make and keep an MD appointment before any further refills of any medications will be given.

## 2012-05-15 ENCOUNTER — Telehealth: Payer: Self-pay | Admitting: Family Medicine

## 2012-05-15 ENCOUNTER — Other Ambulatory Visit: Payer: Self-pay | Admitting: Family Medicine

## 2012-05-15 ENCOUNTER — Ambulatory Visit (INDEPENDENT_AMBULATORY_CARE_PROVIDER_SITE_OTHER): Payer: No Typology Code available for payment source | Admitting: Family Medicine

## 2012-05-15 ENCOUNTER — Encounter: Payer: Self-pay | Admitting: Family Medicine

## 2012-05-15 VITALS — BP 129/69 | HR 72 | Ht 61.0 in | Wt 233.8 lb

## 2012-05-15 DIAGNOSIS — F411 Generalized anxiety disorder: Secondary | ICD-10-CM

## 2012-05-15 DIAGNOSIS — E1169 Type 2 diabetes mellitus with other specified complication: Secondary | ICD-10-CM

## 2012-05-15 DIAGNOSIS — E7800 Pure hypercholesterolemia, unspecified: Secondary | ICD-10-CM

## 2012-05-15 DIAGNOSIS — Z7189 Other specified counseling: Secondary | ICD-10-CM | POA: Insufficient documentation

## 2012-05-15 DIAGNOSIS — I10 Essential (primary) hypertension: Secondary | ICD-10-CM

## 2012-05-15 DIAGNOSIS — I4891 Unspecified atrial fibrillation: Secondary | ICD-10-CM

## 2012-05-15 DIAGNOSIS — E119 Type 2 diabetes mellitus without complications: Secondary | ICD-10-CM

## 2012-05-15 DIAGNOSIS — E11622 Type 2 diabetes mellitus with other skin ulcer: Secondary | ICD-10-CM | POA: Insufficient documentation

## 2012-05-15 DIAGNOSIS — L97909 Non-pressure chronic ulcer of unspecified part of unspecified lower leg with unspecified severity: Secondary | ICD-10-CM

## 2012-05-15 DIAGNOSIS — Z515 Encounter for palliative care: Secondary | ICD-10-CM | POA: Insufficient documentation

## 2012-05-15 DIAGNOSIS — Z0000 Encounter for general adult medical examination without abnormal findings: Secondary | ICD-10-CM

## 2012-05-15 DIAGNOSIS — Z23 Encounter for immunization: Secondary | ICD-10-CM

## 2012-05-15 LAB — COMPREHENSIVE METABOLIC PANEL
ALT: 12 U/L (ref 0–53)
Albumin: 3.8 g/dL (ref 3.5–5.2)
CO2: 21 mEq/L (ref 19–32)
Calcium: 10.1 mg/dL (ref 8.4–10.5)
Chloride: 101 mEq/L (ref 96–112)
Glucose, Bld: 361 mg/dL — ABNORMAL HIGH (ref 70–99)
Potassium: 5.3 mEq/L (ref 3.5–5.3)
Sodium: 133 mEq/L — ABNORMAL LOW (ref 135–145)
Total Protein: 6.9 g/dL (ref 6.0–8.3)

## 2012-05-15 LAB — POCT GLYCOSYLATED HEMOGLOBIN (HGB A1C): Hemoglobin A1C: 12

## 2012-05-15 LAB — LDL CHOLESTEROL, DIRECT: Direct LDL: 57 mg/dL

## 2012-05-15 MED ORDER — GLUCOSE BLOOD VI STRP: ORAL_STRIP | Status: DC

## 2012-05-15 MED ORDER — CLONAZEPAM 0.25 MG PO TBDP: 0.2500 mg | ORAL_TABLET | Freq: Every evening | ORAL | Status: DC | PRN

## 2012-05-15 NOTE — Assessment & Plan Note (Signed)
BP well controlled at today's visit (129/69). Continue current medication regimen. Will check CMET today.

## 2012-05-15 NOTE — Progress Notes (Signed)
Patient ID: [name], male   DOB: 06-Aug-1956, 56 y.o.   MRN: [id number]  HPI: [name] is a 56 y.o. male here to follow up on his chronic medical conditions and talk about a refill on his klonopin. He has a history of mental retardation and lives with his sister [name] and her boyfriend, who help him. He is here alone at his visit today.  Diabetes: Has not checked blood sugars in about 3 months as he is out of strips. Says he has not been to the eye doctor since 2004.   Hypertension: does not check at home. Occasional headaches. No changes in vision. Does sometimes get dizzy with standing.  Atrial fibrillation: does occasionally feel palpitations. Takes pradaxa.  CAD: takes nitroglycerin 1-2 times per week. Last time he took it was 3 days ago. He knows to go to the hospital if a second dose of nitroglycerin does not relieve his chest pain. He says he sits down a lot to keep his chest from hurting. Occasionally gets short of breath.  Anxiety: has been taking klonopin 0.5mg  prn at night for anxiety/sleep. Mood seems to be stable. Pt says klonopin helps him sleep. He is ok with stopping it. Denies SI/HI.  Medication reconciliation was attempted, but pt is unable to verbalize what medications he is taking. Says he takes Lantus 40 units. Unable to say whether he's taking diltiazem.   Spoke with pt about ADLs and who helps him. He says he bathes himself. Lives with sister [name] and her boyfriend, and they all work together on Pharmacologist.  Would like to get flu shot today.  ROS: See HPI  PHYSICAL EXAM: Gen: appears older than stated age. Is disheveled, wearing dirty, malodorous clothing. Pants fall down to below buttocks when sitting in chair.  HEENT: poor dentition Heart: RRR Lungs: CTAB, normal respiratory effort Abd: large pannus, large abdominal hernia. Bowel sounds audible from across the room. Belly is nontender. Has multiple large bandaids on his abdomen covering areas where he  has picked his skin. Ext: has several small open wounds on arms that are actively oozing blood. When asked to remove shoes and socks, there is a nickel sized superficial ulcer on his left anterior shin with 3-4 cm of surrounding erythema. No purulent drainage, warmth, or tenderness. Monofilament testing shows diminished sensation on the plantar aspect of the left foot, with normal testing on the right foot. 2+ DP on R foot, unable to palpate DP on left. Neuro: speech intact. General cognition appears delayed, but pt is friendly and cooperative.

## 2012-05-15 NOTE — Patient Instructions (Addendum)
Please stop taking the Klonopin. If you are having anxiety or trouble sleeping you can make an appointment to come back.  For your leg ulcer, you can apply a non-stick wound pad to it with paper tape to keep it clean and dry. You should change the bandage once per day. A nurse will come out to your house to help you with the wound and your medications. I want to see you back in one week to recheck your leg.  Make an appointment to go to the eye doctor. Please check your blood sugar at home and write down the numbers. I have refilled your test strips.  I will call you if your test results are not normal.  Otherwise, I will send you a letter.  If you do not hear from me with in 2 weeks please call our office.

## 2012-05-15 NOTE — Assessment & Plan Note (Addendum)
Unable to assess medication compliance as pt does not check blood sugars at home, and is unable to reliably report his medications. He says he does take Lantus 40 units at night. A1c is 12.0 today. Currently has ulcer on left anterior shin.  Will order blood sugar test strips so that he can check his sugars at home. Have also ordered home health nursing to for medication assistance, social work assessment, and wound care as pt is not reliable historian with regards to medications.   eye exam-- not in several years, will have pt make appt with eye doctor Medication-- lantus 40units daily injected into arms, unsure if taking consistently Blood pressure-- on lisinopril, bp well controlled today at 129/69. Renal-lisinopril prescribed. Foot exam -- done today, diminished sensation on plantar aspect of left foot Dental--past due; did not address at today's appointment.

## 2012-05-15 NOTE — Assessment & Plan Note (Signed)
Flu shot given today. Pt due for colonoscopy. Mentioned it to him today. Will readdress at future visit as we had a lot of topics to cover this visit.

## 2012-05-15 NOTE — Telephone Encounter (Signed)
Saw pt in clinic today and had instructed him to stop taking klonopin.  From chart review after visit ended, it appears that pt has been on the klonopin for a couple of years. I am concerned about having him stop it too abruptly, and do not want him to go into withdrawal. I attempted to call him this evening around 8pm, and got his sister Bonita Quin on the phone, who reports that she is his primary caretaker (patient is mentally disabled). She is listed as his emergency contact (and appears also to be his legal guardian) and has been involved in his care in person and over the phone previously according to our records, so I felt it safe to speak with her about his care. She was aware of the issues we discussed at his visit today and had seen his visit instructions.   She says he is out of the Klonopin. I told her I will send in a decreased dose of the klonopin in an effort to taper him off of it without inducing withdrawal. Instructed her to make sure he attends his f/u appointment in one week. Bonita Quin stated understanding.

## 2012-05-15 NOTE — Assessment & Plan Note (Signed)
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 Does not appear infected at this time. Will cover it here in clinic with non-stick dressing and paper tape, and give pt supplies to do this at home as well. Have ordered home health RN for wound care. Will have pt f/u in 1 week to reassess leg ulcer.

## 2012-05-15 NOTE — Assessment & Plan Note (Addendum)
Unsure if pt is actually taking diltiazem for rate control. Have ordered home health RN for medication assistance to see what pt is actually taking. Continue pradaxa.

## 2012-05-15 NOTE — Assessment & Plan Note (Addendum)
Pt allegedly taking paroxetine 40mg  daily and klonopin 0.5mg  qhs prn. From his report, he seems to be taking it every night. Would like to get him off the Klonopin as this is not an ideal long term medication. He is okay with stopping it, so I instructed him to stop it.  From chart review after visit ended, it appears that pt has been on the klonopin for a couple of years. I am concerned about having him stop it too abruptly, and do not want him to go into withdrawal. I attempted to call him this evening, and got his sister Bonita Quin on the phone, who reports that she is his primary caretaker (patient is mentally disabled). She is listed as his emergency contact (and appears also to be his legal guardian) and has been involved in his care in person and over the phone previously according to our records, so I felt it safe to speak with her about his care. She was aware of the issues we discussed at his visit today and had seen his visit instructions.  She says he is out of the Klonopin. I told her I will send in a decreased dose of the klonopin in an effort to taper him off of it without inducing withdrawal. Instructed her to make sure he attends his f/u appointment in one week. Bonita Quin stated understanding.

## 2012-05-16 ENCOUNTER — Encounter: Payer: Self-pay | Admitting: Family Medicine

## 2012-05-16 ENCOUNTER — Telehealth: Payer: Self-pay | Admitting: Family Medicine

## 2012-05-16 DIAGNOSIS — E7800 Pure hypercholesterolemia, unspecified: Secondary | ICD-10-CM

## 2012-05-16 DIAGNOSIS — E119 Type 2 diabetes mellitus without complications: Secondary | ICD-10-CM

## 2012-05-16 DIAGNOSIS — F411 Generalized anxiety disorder: Secondary | ICD-10-CM

## 2012-05-16 NOTE — Telephone Encounter (Signed)
Spoke with pt on the phone, and relayed to him the message about sending in a lower dose of klonopin in an effort to wean off.   He did confirm to me that we may speak about his healthcare to his sister [name]. He says his monthly checks are designated to her name and confirms that she is his guardian.

## 2012-05-16 NOTE — Telephone Encounter (Signed)
Attempted to call pt to discuss klonopin prescription. His # [phone]) was disconnected, so I called the same number at which I reached his sister/caretaker last night [phone]) when I tried to call him. She gave me an additional number for him, [phone]. He did not answer this phone x 2, so I will try it back later.

## 2012-05-22 ENCOUNTER — Encounter: Payer: Self-pay | Admitting: Family Medicine

## 2012-05-22 ENCOUNTER — Ambulatory Visit (INDEPENDENT_AMBULATORY_CARE_PROVIDER_SITE_OTHER): Payer: No Typology Code available for payment source | Admitting: Family Medicine

## 2012-05-22 VITALS — BP 127/87 | HR 68 | Ht 60.0 in | Wt 233.0 lb

## 2012-05-22 DIAGNOSIS — E119 Type 2 diabetes mellitus without complications: Secondary | ICD-10-CM

## 2012-05-22 DIAGNOSIS — E1169 Type 2 diabetes mellitus with other specified complication: Secondary | ICD-10-CM

## 2012-05-22 DIAGNOSIS — E875 Hyperkalemia: Secondary | ICD-10-CM

## 2012-05-22 DIAGNOSIS — L97909 Non-pressure chronic ulcer of unspecified part of unspecified lower leg with unspecified severity: Secondary | ICD-10-CM

## 2012-05-22 DIAGNOSIS — E11622 Type 2 diabetes mellitus with other skin ulcer: Secondary | ICD-10-CM

## 2012-05-22 LAB — BASIC METABOLIC PANEL
BUN: 32 mg/dL — ABNORMAL HIGH (ref 6–23)
CO2: 18 mEq/L — ABNORMAL LOW (ref 19–32)
Calcium: 9.1 mg/dL (ref 8.4–10.5)
Glucose, Bld: 271 mg/dL — ABNORMAL HIGH (ref 70–99)

## 2012-05-22 NOTE — Assessment & Plan Note (Signed)
Noted on last bmet, will recheck today.

## 2012-05-22 NOTE — Assessment & Plan Note (Signed)
Will increase Lantus to 45 units daily.

## 2012-05-22 NOTE — Patient Instructions (Addendum)
Increase Lantus to 45 units a day  Will send home health to the correct address- we had the wrong address in our records  Follow-up with Dr.. Velardi- your primary doctor in 2-3 weeks  Bring your meter and medicines to every office visit

## 2012-05-22 NOTE — Assessment & Plan Note (Addendum)
Not infected today.  Healing complicated by DM and chronic skin picking.  Dressed in office. Gave home health correct address to initiate home health services.  Will follow-up with PCp in 2-3 weeks

## 2012-05-22 NOTE — Progress Notes (Signed)
  Subjective:    Patient ID: [name], male    DOB: [birth year], 56 y.o.   MRN: [id number]  HPI Patient with multiple medical comorbidities to include DM and MR here for follow-up of left shin wound per his PCP.  Left shin wound:  Was seen 1 week ago, discussed wound care and home health care was ordered.  Patient reports no one has come to his house, but his friend reported a home health nurse came to the home the patient previously lived in.  Wound has been itching and he  Reports picking it, not worsening redness, pus drainage, or fevers.  Diabetes:  Taking Lantus 40 units daily.  Brings in log which shows sugars in 300-400's.  No hypoglycemia.  I have reviewed patient's  PMH, FH, and Social history and Medications as related to this visit. Did not bring medications to today's visit.  He called his siater [name] to discuss care with me.  She reports her daughter? Fills [name]'s pill box and reminds him to take his medications but that [name] takes his insuline and checks cbg's himself.  He is able to verbalize to me that he currently take 40 units a day  Review of Systems See HPI    Objective:   Physical Exam GEN: NAD, obese Skin:  Left lower shin, several scabs, actively bleeding onto his sock.  Some mild erythema, no cellulitis, no pus drainage.       Assessment & Plan:

## 2012-05-23 ENCOUNTER — Telehealth: Payer: Self-pay | Admitting: Family Medicine

## 2012-05-23 NOTE — Telephone Encounter (Signed)
Spoke with Harvie Heck about elevated K, Decklin is ok for me to speak with his family and provided me with phone numbers- Silbernagel has MR and has difficulty managing his own healthcare).  I called Bonita Quin, the sister he lives with who thinks he has been taking all of the medications as listed, but she does not know as she does not read well.  She will have her daughter Toniann Fail call me this afternoon as she has all of his pill bottles and fills his medications for him.

## 2012-05-24 ENCOUNTER — Telehealth: Payer: Self-pay | Admitting: *Deleted

## 2012-05-24 NOTE — Telephone Encounter (Signed)
[facility] nurse called from pt's house. She was very frustrated, because she did not receive specific orders for wound care. I told her to evaluate pt's wounds and take care of them per protocol of [facility]. If she has any more questions, I will have Dr.Briscoe to clarify. She reports, that pt always scratches his wounds open and she asked  'Should I put a bandage on him every day?' I told her to just cover his wounds, try to talk to pt not to scratch that much to prevent infections. We went over pt's current medication list. She said, there is no need for Dr.Briscoe to call her and she will take care of the pt. Lorenda Hatchet, Renato Battles

## 2012-05-30 ENCOUNTER — Telehealth: Payer: Self-pay | Admitting: Family Medicine

## 2012-05-30 DIAGNOSIS — E875 Hyperkalemia: Secondary | ICD-10-CM

## 2012-05-30 NOTE — Telephone Encounter (Signed)
Called and spoke with pt's sister, Bonita Quin, who helps take care of him. Explained the high potassium value.   Asked her to have him STOP taking lisinopril. He will come in tomorrow at 4:15pm for a lab appointment to recheck his BMET. She wrote this down and stated her understanding of stopping the lisinopril and coming tomorrow afternoon for a lab draw.  She is giving him 45 units of lantus as directed by Dr. Earnest Bailey. Bonita Quin tries to keep him drinking diet drinks and tries to keep him away from bread, but is wondering why his sugars are still high. She says he is eating and drinking okay.  She wasn't sure if he is taking his lasix as his medications are largely managed by her daughter, Toniann Fail. I will attempt to call Toniann Fail [phone]) this afternoon to go over his medications, but in the meantime, the most important thing is for him to stop taking the lisinopril.  Pt has an office visit scheduled for Tuesday, 06/05/12. I have asked Bonita Quin to have pt bring his medications with him to that appointment. It would also be helpful if either Toniann Fail or Bonita Quin could accompany him to that visit.  Precepted high potassium value and plan with Dr. Mauricio Po before calling.

## 2012-05-30 NOTE — Telephone Encounter (Signed)
Called and spoke with Toniann Fail, patient's niece [phone]). She is the one who manages his medications.   Told her to stop his lisinopril and for him to keep taking lasix and insulin.  She asked about how to dispose of the lisinopril and I told her she could just throw it away or bring it here and we can dispose of it. She understood these instructions.  (See other telephone note from today for more information about plan, upcoming appointments, etc.)

## 2012-05-31 ENCOUNTER — Other Ambulatory Visit: Payer: No Typology Code available for payment source

## 2012-05-31 DIAGNOSIS — E875 Hyperkalemia: Secondary | ICD-10-CM

## 2012-05-31 LAB — BASIC METABOLIC PANEL
CO2: 25 mEq/L (ref 19–32)
Calcium: 9.8 mg/dL (ref 8.4–10.5)
Creat: 1.03 mg/dL (ref 0.50–1.35)
Glucose, Bld: 423 mg/dL — ABNORMAL HIGH (ref 70–99)
Sodium: 133 mEq/L — ABNORMAL LOW (ref 135–145)

## 2012-05-31 NOTE — Progress Notes (Signed)
BMP DONE TODAY Luan Urbani 

## 2012-06-05 ENCOUNTER — Ambulatory Visit (INDEPENDENT_AMBULATORY_CARE_PROVIDER_SITE_OTHER): Payer: No Typology Code available for payment source | Admitting: Family Medicine

## 2012-06-05 VITALS — BP 151/86 | HR 74 | Temp 98.0°F | Ht 60.0 in | Wt 235.0 lb

## 2012-06-05 DIAGNOSIS — E875 Hyperkalemia: Secondary | ICD-10-CM

## 2012-06-05 DIAGNOSIS — F411 Generalized anxiety disorder: Secondary | ICD-10-CM

## 2012-06-05 DIAGNOSIS — I10 Essential (primary) hypertension: Secondary | ICD-10-CM

## 2012-06-05 DIAGNOSIS — E11622 Type 2 diabetes mellitus with other skin ulcer: Secondary | ICD-10-CM

## 2012-06-05 DIAGNOSIS — E119 Type 2 diabetes mellitus without complications: Secondary | ICD-10-CM

## 2012-06-05 DIAGNOSIS — E1169 Type 2 diabetes mellitus with other specified complication: Secondary | ICD-10-CM

## 2012-06-05 DIAGNOSIS — L97909 Non-pressure chronic ulcer of unspecified part of unspecified lower leg with unspecified severity: Secondary | ICD-10-CM

## 2012-06-05 DIAGNOSIS — I4891 Unspecified atrial fibrillation: Secondary | ICD-10-CM

## 2012-06-05 LAB — BASIC METABOLIC PANEL
CO2: 21 mEq/L (ref 19–32)
Calcium: 9.8 mg/dL (ref 8.4–10.5)
Chloride: 99 mEq/L (ref 96–112)
Creat: 1.1 mg/dL (ref 0.50–1.35)
Sodium: 133 mEq/L — ABNORMAL LOW (ref 135–145)

## 2012-06-05 MED ORDER — LANTUS SOLOSTAR 100 UNIT/ML ~~LOC~~ SOPN: 50.0000 [IU] | PEN_INJECTOR | Freq: Every day | SUBCUTANEOUS | Status: DC

## 2012-06-05 MED ORDER — ONETOUCH ULTRASOFT LANCETS MISC: Status: DC

## 2012-06-05 MED ORDER — HYDROCORTISONE 2.5 % EX OINT: TOPICAL_OINTMENT | Freq: Three times a day (TID) | CUTANEOUS | Status: DC

## 2012-06-05 MED ORDER — INSULIN PEN NEEDLE 31G X 5 MM MISC: Status: DC

## 2012-06-05 NOTE — Assessment & Plan Note (Addendum)
Diltiazem was not in patient's container of medications he brought with him to this visit. I have called his pharmacy to inquire about whether he's been getting it filled. They report regular refills being obtained since June, except he missed the month of August, so it seems that he is taking it. His niece Toniann Fail) helps him by putting his medications in a pill box for him so it is possible that this bottle just didn't get included. It would be ideal if Toniann Fail could come to a future visit to help [place] clarify what he is taking.  Pulse today 74, so seems rate controlled at this time. Continue pradaxa for anticoagulation.

## 2012-06-05 NOTE — Assessment & Plan Note (Signed)
Stopped lisinopril last week secondary to hyperkalemia. BP is elevated again at 151/86 today. Will continue to hold lisinopril. Recheck BMET today. Reassess at visit in 1 week.

## 2012-06-05 NOTE — Patient Instructions (Addendum)
It was great to see you again today!  For your diabetes, let's increase your Lantus (insulin) to 50 units every day.  We are rechecking your blood work today. You should NOT take any non-steroidal antiinflammatory drugs (NSAIDs) like ibuprofen, naproxen etc as these can harm your kidneys. Please continue to NOT take the lisinopril.  For your leg wound, you can apply the hydrocortisone ointment up to 3 times per day. Do not put the ointment on any area that has broken skin, but just in the surrounding areas. Keep it covered with the special bandage we put on it today. Do not use any alcohol, peroxide, or other solutions on the wound or skin.  Come back to clinic on Monday morning at 10:30am so we can see how you are doing.  I will call you if your test results are not normal.  Otherwise, I will send you a letter.  If you do not hear from me with in 2 weeks please call our office.

## 2012-06-05 NOTE — Progress Notes (Signed)
Patient ID: [name], male   DOB: 04-21-1956, 56 y.o.   MRN: [id number]  HPI: [name] is a 56 y.o. male here to follow up on his diabetes, leg wound, and to recheck his BMET.   Hypertension: I called him last week to have him stop taking his lisinopril because his potassium was elevated at 5.7. He has brought in his medications today and lisinopril is no longer in his medication container.   CAD: Denies shortness of breath. Took his nitroglycerin one time a few days ago for chest pain.  Diabetes: currently taking 45 units of lantus per day. Checking blood sugars at home and getting 300s-400s. Has felt a little sick to his stomach lately but has not vomited. Has some gas pain. Has been eating well.  Leg ulcer: bumped it on something when he stepped in a hole and stumbled, causing another area to open up. Has been using peroxide and alcohol on it. His leg itches. Home health came twice and helped dress it.  Mood: gets nervous around people. Says his mood has been about the same. Thinks the decreased dose of ativan isn't doing as much. Going to start tapering it down soon. Denies SI/HI. Says he walks away if he gets mad at people so he won't hurt them.  ROS: See HPI  PHYSICAL EXAM: BP 151/86  Pulse 74  Temp 98 F (36.7 C) (Oral)  Ht 5' (1.524 m)  Wt 235 lb (106.595 kg)  BMI 45.90 kg/m2 Gen: NAD, pleasant, cooperative Heart: RRR Lungs: CTAB Skin: still with multiple open superficial wounds from picking at skin GI: large ventral abdominal hernia, multiple bandaids in place over areas on stomach where he has picked at skin. Ext: left lower extremity ulcer on anterior shin with some surrounding erythema. nontender to palpation. Oozing clear fluid. Two distinct places of open skin, each about 2 inches by 4 inches. Surrounding skin is dry and irritated. Neuro: speech intact. Decreased cognition at baseline from MR.

## 2012-06-05 NOTE — Assessment & Plan Note (Addendum)
Recently increased insulin to 45 units of lantus per day, but pt has persistently elevated CBGs at home.  No longer on ACE due to hyperkalemia.  Refilled lancets, test strips, and pen needles. Check BMET as K was elevated at 5.7 last visit. Counseled to avoid NSAIDs. Will increase lantus again to 50 units per day for better control.  Very complicated patient with cognitive delay. Will refer to [facility] care management for assistance with his medications and to assess for other needs.  Noticed after visit: he is also on metformin - probably does not need this given high insulin dose. Will discuss with preceptor whether to d/c metformin.

## 2012-06-05 NOTE — Assessment & Plan Note (Signed)
Anxiety is primarily related to being around people, pt prefers to be alone. Taking klonopin at night to help him sleep. Pt to begin tapering klonopin 0.25mg  qhs to every other day as this should not be a long term medication.

## 2012-06-05 NOTE — Assessment & Plan Note (Addendum)
Does not appear to be infected at this time so will hold off on using antibiotics. Superficial wound over anterior left shin. Instructed pt to d/c using peroxide and alcohol on the wound. Will rx hydrocortisone cream for surrounding irritation and itching. Wound irrigated and dressed today with hydrocolloid dressing, which should be left on for one week. Would have liked pt to return for wound check on Friday, but he says he is busy that day. Have scheduled him an appointment for Monday morning with Dr. Louanne Belton to recheck wound and BP.

## 2012-06-05 NOTE — Assessment & Plan Note (Signed)
Recheck BMET today. Stopped lisinopril last week secondary to K of 5.7.

## 2012-06-09 ENCOUNTER — Other Ambulatory Visit: Payer: Self-pay | Admitting: Family Medicine

## 2012-06-11 ENCOUNTER — Ambulatory Visit (INDEPENDENT_AMBULATORY_CARE_PROVIDER_SITE_OTHER): Payer: No Typology Code available for payment source | Admitting: Family Medicine

## 2012-06-11 VITALS — BP 131/71 | HR 72 | Temp 97.5°F | Wt 233.7 lb

## 2012-06-11 DIAGNOSIS — L97909 Non-pressure chronic ulcer of unspecified part of unspecified lower leg with unspecified severity: Secondary | ICD-10-CM

## 2012-06-11 DIAGNOSIS — E119 Type 2 diabetes mellitus without complications: Secondary | ICD-10-CM

## 2012-06-11 DIAGNOSIS — E875 Hyperkalemia: Secondary | ICD-10-CM

## 2012-06-11 DIAGNOSIS — E1169 Type 2 diabetes mellitus with other specified complication: Secondary | ICD-10-CM

## 2012-06-11 LAB — BASIC METABOLIC PANEL
BUN: 29 mg/dL — ABNORMAL HIGH (ref 6–23)
CO2: 24 mEq/L (ref 19–32)
Chloride: 97 mEq/L (ref 96–112)
Glucose, Bld: 356 mg/dL — ABNORMAL HIGH (ref 70–99)
Potassium: 4.8 mEq/L (ref 3.5–5.3)
Sodium: 133 mEq/L — ABNORMAL LOW (ref 135–145)

## 2012-06-11 LAB — CBC WITH DIFFERENTIAL/PLATELET
Basophils Absolute: 0.1 10*3/uL (ref 0.0–0.1)
Eosinophils Absolute: 0.6 10*3/uL (ref 0.0–0.7)
Eosinophils Relative: 7 % — ABNORMAL HIGH (ref 0–5)
HCT: 34.7 % — ABNORMAL LOW (ref 39.0–52.0)
MCH: 23.8 pg — ABNORMAL LOW (ref 26.0–34.0)
MCV: 75.1 fL — ABNORMAL LOW (ref 78.0–100.0)
Monocytes Absolute: 0.7 10*3/uL (ref 0.1–1.0)
Platelets: 274 10*3/uL (ref 150–400)
RDW: 16.2 % — ABNORMAL HIGH (ref 11.5–15.5)

## 2012-06-11 MED ORDER — PAROXETINE HCL 40 MG PO TABS: 40.0000 mg | ORAL_TABLET | Freq: Every day | ORAL | Status: DC

## 2012-06-11 MED ORDER — DOXYCYCLINE HYCLATE 100 MG PO TABS: 100.0000 mg | ORAL_TABLET | Freq: Two times a day (BID) | ORAL | Status: DC

## 2012-06-11 MED ORDER — DABIGATRAN ETEXILATE MESYLATE 150 MG PO CAPS: 150.0000 mg | ORAL_CAPSULE | Freq: Two times a day (BID) | ORAL | Status: DC

## 2012-06-11 MED ORDER — METOPROLOL TARTRATE 25 MG PO TABS: 25.0000 mg | ORAL_TABLET | Freq: Two times a day (BID) | ORAL | Status: DC

## 2012-06-11 MED ORDER — LANTUS SOLOSTAR 100 UNIT/ML ~~LOC~~ SOPN: 55.0000 [IU] | PEN_INJECTOR | Freq: Every day | SUBCUTANEOUS | Status: DC

## 2012-06-11 MED ORDER — GABAPENTIN 300 MG PO CAPS: 300.0000 mg | ORAL_CAPSULE | Freq: Two times a day (BID) | ORAL | Status: DC

## 2012-06-11 MED ORDER — FUROSEMIDE 40 MG PO TABS: 40.0000 mg | ORAL_TABLET | Freq: Every day | ORAL | Status: DC

## 2012-06-11 MED ORDER — ATORVASTATIN CALCIUM 40 MG PO TABS: 40.0000 mg | ORAL_TABLET | Freq: Every day | ORAL | Status: DC

## 2012-06-11 NOTE — Assessment & Plan Note (Signed)
Poor control.  Increase lantus to 55 units daily.  Would consider splitting dose in future to BID.  When see him on Thursday or Friday of this week will likely move to 30 units BID.  If this does not improve with resolution of his leg wound may need some form of meal time insulin to achieve some control.

## 2012-06-11 NOTE — Assessment & Plan Note (Signed)
Was resolving at last check.  Will plan to recheck today to assure resolution.

## 2012-06-11 NOTE — Patient Instructions (Signed)
We are putting a special wrap on your leg.  Do not take it off. Try to elevate your leg as much as possible. I am sending in an antibiotic for you to start taking today.  Take it twice per day for the next 10 days. I want you to come back on Thursday or Friday for [place] to take the wrap off. Please increase your lantus dose to 55 units every day.

## 2012-06-11 NOTE — Assessment & Plan Note (Signed)
Am concerned for superimposed cellulitis.  Will rx doxy and place UNA boot.  See back Thursday-Friday and if not improving will refer to wound care.  Given history of smoking, HTN, HLD, and poorly controlled DM is high risk of loosing leg if not treated aggressively.  Would likely benefit from seeing vascular in the future for eval for revascularization.

## 2012-06-11 NOTE — Progress Notes (Signed)
Patient ID: [name], male   DOB: January 24, 1956, 56 y.o.   MRN: [id number] Subjective: The patient is a 56 y.o. year old male who presents today for f/u.  DM: BG 300-400, no n/v/d.  No fevers/chills.  Taking 50 units lantus daily along with metformin.  Hyperkalemia: Last check showed resolving s/p discontinuation of lisinopril.  Leg ulcer: Says is swollen and painful.  No increase in draining.  Not sure if is more red.  Patient's past medical, social, and family history were reviewed and updated as appropriate. History  Substance Use Topics  . Smoking status: Former Smoker -- 1.0 packs/day for 42 years    Types: Cigarettes  . Smokeless tobacco: Former Neurosurgeon    Quit date: 10/02/2010  . Alcohol Use: No   Objective:  Filed Vitals:   06/11/12 0845  BP: 131/71  Pulse: 72  Temp: 97.5 F (36.4 C)   Gen: NAD, morbidly obese Ext: left leg has 8x2cm open wound surrounded by several 0.5-1cm open wounds.  Area of erythema extends 4-6 cm in every direction from wound and is surrounded by a number of blanching punctate lesions.  DP pulse barely palpable.  Assessment/Plan:  Please also see individual problems in problem list for problem-specific plans.

## 2012-06-15 ENCOUNTER — Ambulatory Visit: Payer: No Typology Code available for payment source | Admitting: Family Medicine

## 2012-06-18 ENCOUNTER — Ambulatory Visit: Payer: No Typology Code available for payment source | Admitting: Family Medicine

## 2012-06-18 ENCOUNTER — Encounter: Payer: Self-pay | Admitting: Home Health Services

## 2012-06-19 ENCOUNTER — Encounter: Payer: Self-pay | Admitting: Home Health Services

## 2012-06-20 ENCOUNTER — Telehealth: Payer: Self-pay | Admitting: Family Medicine

## 2012-06-20 NOTE — Telephone Encounter (Signed)
FL-2 to be completed by Pollie Meyer. Call [facility] at [phone] when ready for pick - up.

## 2012-06-22 NOTE — Telephone Encounter (Signed)
Called and spoke with [facility] yesterday about completing FL-2. The social worker Biomedical engineer) and nurse who have been visiting him through home health are concerned and think it would be best for patient to go into some kind of facility for help with administering medications, wound care, access to physicians, etc. I agree with this assessment, and according to home health's discussions with patient, pt is willing to go.  Called again today and left message with her regarding what level of care (skilled nursing, intermediate care, rest home) she thinks is most appropriate for pt. Asked her to call back and let us know. Once I know that information, I can finish filling out the FL-2.

## 2012-06-26 NOTE — Telephone Encounter (Signed)
Called Alice again yesterday, with no response, then called her today and she answered. She thinks intermediate care is most appropriate for [facility]. I have signed the FL-2 and will leave it at the front desk for her to pick up.

## 2012-07-09 ENCOUNTER — Other Ambulatory Visit: Payer: Self-pay | Admitting: *Deleted

## 2012-07-09 ENCOUNTER — Telehealth: Payer: Self-pay | Admitting: Family Medicine

## 2012-07-09 DIAGNOSIS — E119 Type 2 diabetes mellitus without complications: Secondary | ICD-10-CM

## 2012-07-09 NOTE — Telephone Encounter (Signed)
Pharmacy requesting refill of Lantus Solostar.  Will route request to Dr. Pollie Meyer.  Gaylene Brooks, RN

## 2012-07-09 NOTE — Telephone Encounter (Signed)
Returned call to patient.  His niece will pick up his insulin syringes and check with pharmacy to have syringes changed to correct ones.  Patient will have pharmacy call us if they have any questions or problems with the syringes.  Gaylene Brooks, RN

## 2012-07-09 NOTE — Telephone Encounter (Signed)
Pt is out of his insulin and he picked up that wrong needles - sister was calling and she didn't know that kind of needles that he needs -  [facility] [address]

## 2012-07-10 ENCOUNTER — Other Ambulatory Visit: Payer: Self-pay | Admitting: Family Medicine

## 2012-07-10 MED ORDER — LANTUS SOLOSTAR 100 UNIT/ML ~~LOC~~ SOPN: 55.0000 [IU] | PEN_INJECTOR | Freq: Every day | SUBCUTANEOUS | Status: DC

## 2012-07-10 NOTE — Telephone Encounter (Signed)
Please call pt to let him know I have refilled his lantus but he needs an appointment (no showed for last 2 appts). Thanks!

## 2012-07-13 NOTE — Telephone Encounter (Signed)
Patient has an appt for med refill with Dr. Pollie Meyer on 07/23/12.  Gaylene Brooks, RN

## 2012-07-23 ENCOUNTER — Encounter: Payer: Self-pay | Admitting: Family Medicine

## 2012-07-23 ENCOUNTER — Ambulatory Visit (INDEPENDENT_AMBULATORY_CARE_PROVIDER_SITE_OTHER): Payer: No Typology Code available for payment source | Admitting: Family Medicine

## 2012-07-23 VITALS — BP 150/74 | HR 76 | Ht 60.0 in | Wt 236.0 lb

## 2012-07-23 DIAGNOSIS — E875 Hyperkalemia: Secondary | ICD-10-CM

## 2012-07-23 DIAGNOSIS — R29898 Other symptoms and signs involving the musculoskeletal system: Secondary | ICD-10-CM

## 2012-07-23 DIAGNOSIS — E119 Type 2 diabetes mellitus without complications: Secondary | ICD-10-CM

## 2012-07-23 DIAGNOSIS — E1165 Type 2 diabetes mellitus with hyperglycemia: Secondary | ICD-10-CM

## 2012-07-23 DIAGNOSIS — F411 Generalized anxiety disorder: Secondary | ICD-10-CM

## 2012-07-23 DIAGNOSIS — I10 Essential (primary) hypertension: Secondary | ICD-10-CM

## 2012-07-23 DIAGNOSIS — E11622 Type 2 diabetes mellitus with other skin ulcer: Secondary | ICD-10-CM

## 2012-07-23 DIAGNOSIS — IMO0001 Reserved for inherently not codable concepts without codable children: Secondary | ICD-10-CM

## 2012-07-23 DIAGNOSIS — I4891 Unspecified atrial fibrillation: Secondary | ICD-10-CM

## 2012-07-23 DIAGNOSIS — E1169 Type 2 diabetes mellitus with other specified complication: Secondary | ICD-10-CM

## 2012-07-23 DIAGNOSIS — L97909 Non-pressure chronic ulcer of unspecified part of unspecified lower leg with unspecified severity: Secondary | ICD-10-CM

## 2012-07-23 DIAGNOSIS — R0789 Other chest pain: Secondary | ICD-10-CM

## 2012-07-23 LAB — BASIC METABOLIC PANEL
Chloride: 99 mEq/L (ref 96–112)
Potassium: 5.1 mEq/L (ref 3.5–5.3)

## 2012-07-23 MED ORDER — NITROGLYCERIN 0.4 MG SL SUBL: 0.4000 mg | SUBLINGUAL_TABLET | SUBLINGUAL | Status: DC | PRN

## 2012-07-23 MED ORDER — INSULIN GLARGINE 100 UNIT/ML ~~LOC~~ SOLN: 30.0000 [IU] | Freq: Two times a day (BID) | SUBCUTANEOUS | Status: DC

## 2012-07-23 NOTE — Patient Instructions (Addendum)
It was great to see you again today.  For your diabetes, let's change your Lantus to 30 units two times per day.  I am sending in a refill of your nitroglycerin. You can take it if you have chest pain. If you have to take a second dose and you still have pain, you should go to the emergency room immediately.  We are going to work on getting you set up to go to a nursing facility. They will be able to help you with your sugars, medications, and help you gain some strength back.  Call the clinic if you have any questions.  Happy Holidays! -Dr. Pollie Meyer

## 2012-07-23 NOTE — Progress Notes (Signed)
HPI: [name] is a 56 y.o. male here to follow up on his chronic medical conditions.  Weakness: Says his "legs have given out on" him several times. He has fallen a few times and has to lay down for a few minutes. Other times he catches himself before he falls. Denies LOC.  Blood pressure: -Doesn't check at home, doesn't have monitor  Diabetes: Takes 55 units of Lantus daily. Sugar has recently gotten as high as 600 after eating Thanksgiving food. The lowest numbers he's gotten are around 280.   Leg wound: "coming along". Has not been draining. Was swollen when we were placing the dressings on his legs previously.  Chest Pain: Occasionally has chest pain (a few minutes during the day, every day) but isn't sure if it's gas related. Gets short of breath when walking. The pain doesn't get worse when walking. Is out of his nitroglycerin.  Anxiety: We've been trying to get him off the Klonopin that he takes at night, out of concern for fall risk at baseline. He likes the klonopin but is willing to come off it if he needs to. Has been spaced out to 0.25mg  every other night.   Placement: Home health hasn't come out to see him since Thanksgiving. Was going to get set up for a SNF, but that never happened. Was in a SNF previously after a hospitalization a few years ago (on [address] [place] in town near [address]).  ROS: See HPI  PHYSICAL EXAM: BP 150/74  Pulse 76  Ht 5' (1.524 m)  Wt 236 lb (107.049 kg)  BMI 46.09 kg/m2 Gen: NAD, cooperative, disheveled appearance Heart: RRR, no m/r/g Lungs: CTAB, normal respiratory effort Abd: soft, nontender, large ventral hernia, multiple bandaids cover areas of skin Neuro: nonfocal, speech intact, gait appears at baseline Ext: left lower extremity with area of redness on anterior shin that appears improved from prior exams. Still with an open superficial ulcer that is approximately quarter-sized. nontender to palpation. No  drainage or warmth. Skin: has some open sores on arms as noted previously, but overall fewer in number, no signs of infection

## 2012-07-24 ENCOUNTER — Encounter: Payer: Self-pay | Admitting: Clinical

## 2012-07-24 ENCOUNTER — Telehealth: Payer: Self-pay | Admitting: Clinical

## 2012-07-24 NOTE — Progress Notes (Signed)
CSW met with pt and pt niece yesterday after pt visit with PCP. CSW informed of pt need for ST rehab for recent recurrent falls and to assist with managing medications. Pt and family agreeable. CSW has faxed pt information out to facilities in [facility] and will follow up with pt sister with bed offers.  Theresia Bough, MSW, Theresia Majors [phone]

## 2012-07-24 NOTE — Telephone Encounter (Signed)
CSW informed that [facility] able to offer pt placement tomorrow. Pt sister will pick up pt FL2 from the clinic and transport pt to the facility tomorrow. Pt PCP notified and informed of need for FL2 to be signed.  Theresia Bough, MSW, Theresia Majors [phone]

## 2012-07-25 DIAGNOSIS — R0789 Other chest pain: Secondary | ICD-10-CM | POA: Insufficient documentation

## 2012-07-25 NOTE — Assessment & Plan Note (Addendum)
Remains uncontrolled. Will increase Lantus to 60 units, but will divide into 30units BID dosing for better absorption.   After precepting with Dr. Swaziland and discussion with CSW, will attempt to get placement in SNF for assistance with medications, regaining strength, and monitoring of diabetes. Pt is mentally disabled and has poor insight into his illness, but is willing to go to a SNF. Family unable to supervise and assist with medications and meal choices all the time.  F/u office visit in one month.

## 2012-07-25 NOTE — Assessment & Plan Note (Signed)
Leg still with some erythema and an open quarter-sized sore, but appears much improved. Continue to monitor. Will likely heal better once sugars are better controlled at SNF.

## 2012-07-25 NOTE — Assessment & Plan Note (Signed)
BP elevated at 150/74 in today's visit. We ran out of time to address this in full detail. Recently we discontinued his lisinopril because he had become hyperkalemic. We are seeking placement for him at SNF, and having his BP monitored there will be helpful, as his medications can be titrated up or down as needed.

## 2012-07-25 NOTE — Assessment & Plan Note (Signed)
Patient reports multiple falls at home due to leg weakness. We are seeking SNF placement for him, so that he can have assistance with ambulation, medications, and receive physical therapy to regain his strength. CSW working on placement options today and tomorrow.

## 2012-07-25 NOTE — Assessment & Plan Note (Addendum)
Patient has chest discomfort, that he thinks may be attributable to gas pain. The pain is not worse with exertion. Will refill his nitroglycerin today, with instructions to go to the ER if he has pain after taking a second nitroglycerin. Pt is at very high risk for cardiac event given his multiple comorbidities, and would benefit from outpatient cardiology evaluation. Will plan to refer him to a cardiologist at his next office visit, but will discuss with preceptor whether it would be appropriate to go ahead send referral now. We are seeking SNF placement so now may be a good time for referral as he will have assistance with transportation to appointments.   ADDENDUM 12/28: Discussed this pt last week with Dr. Jennette Kettle, who does not feel that cardiology referral is urgent at this time given that pain is not worse with exertion. Pt may be taking nitroglycerin and getting relief for a condition such as esophageal spasm. Will revisit idea of cardiology referral at f/u appointments.

## 2012-07-25 NOTE — Assessment & Plan Note (Signed)
Continue paroxetine 40mg  daily. Would like patient to come off of klonopin, out of concern for fall risk, which is already high at his baseline. Has been taking this every other night, 0.25mg . Pt understands need to d/c this medication. Will stop providing refills. Since we've tapered down (0.5mg  to 0.25mg , then every other night) he should be at low risk for withdrawal.

## 2012-07-25 NOTE — Assessment & Plan Note (Addendum)
Toniann Fail (pt's niece) accompanied him to this visit and brought his medications. He is taking diltiazem. Pulse 76, suggesting good rate control. Continue diltiazem for rate control and pradaxa for anticoagulation. LFTs were WNL in Oct 2013 (need to be monitored with diltiazem).

## 2012-07-25 NOTE — Assessment & Plan Note (Signed)
Recheck BMET today 

## 2012-08-21 ENCOUNTER — Encounter: Payer: Self-pay | Admitting: Neurosurgery

## 2012-08-22 ENCOUNTER — Other Ambulatory Visit: Payer: No Typology Code available for payment source

## 2012-08-22 ENCOUNTER — Ambulatory Visit: Payer: No Typology Code available for payment source | Admitting: Neurosurgery

## 2012-08-23 ENCOUNTER — Ambulatory Visit (HOSPITAL_COMMUNITY)
Admission: RE | Admit: 2012-08-23 | Discharge: 2012-08-23 | Disposition: A | Discharge status: Home / Self Care | Payer: No Typology Code available for payment source | Source: Ambulatory Visit | Attending: Family Medicine | Admitting: Family Medicine

## 2012-08-23 ENCOUNTER — Encounter: Payer: Self-pay | Admitting: Family Medicine

## 2012-08-23 ENCOUNTER — Ambulatory Visit (INDEPENDENT_AMBULATORY_CARE_PROVIDER_SITE_OTHER): Payer: No Typology Code available for payment source | Admitting: Family Medicine

## 2012-08-23 VITALS — BP 126/78 | HR 78 | Ht 60.0 in | Wt 233.4 lb

## 2012-08-23 DIAGNOSIS — R0789 Other chest pain: Secondary | ICD-10-CM

## 2012-08-23 DIAGNOSIS — I4891 Unspecified atrial fibrillation: Secondary | ICD-10-CM

## 2012-08-23 DIAGNOSIS — R079 Chest pain, unspecified: Secondary | ICD-10-CM

## 2012-08-23 DIAGNOSIS — E875 Hyperkalemia: Secondary | ICD-10-CM

## 2012-08-23 DIAGNOSIS — F411 Generalized anxiety disorder: Secondary | ICD-10-CM

## 2012-08-23 DIAGNOSIS — E1165 Type 2 diabetes mellitus with hyperglycemia: Secondary | ICD-10-CM

## 2012-08-23 DIAGNOSIS — E7800 Pure hypercholesterolemia, unspecified: Secondary | ICD-10-CM

## 2012-08-23 DIAGNOSIS — R011 Cardiac murmur, unspecified: Secondary | ICD-10-CM

## 2012-08-23 LAB — BASIC METABOLIC PANEL
CO2: 27 mEq/L (ref 19–32)
Calcium: 9.3 mg/dL (ref 8.4–10.5)
Chloride: 98 mEq/L (ref 96–112)
Glucose, Bld: 210 mg/dL — ABNORMAL HIGH (ref 70–99)
Sodium: 137 mEq/L (ref 135–145)

## 2012-08-23 MED ORDER — ASSURE LANCETS MISC: Status: DC

## 2012-08-23 MED ORDER — GLUCOSE BLOOD VI STRP: ORAL_STRIP | Status: DC

## 2012-08-23 NOTE — Progress Notes (Signed)
Patient ID: [name], male   DOB: [birth year], 57 y.o.   MRN: [id number]  CC: [name] is a 57 y.o. male here to follow up on his medical problems.  HPI:  At our last visit we set [name] up to be admitted to a SNF due to his very uncontrolled diabetes, and to help with taking his medications. He was there for about 20 days and got home on January 7. He liked being there and thought he got good care.  Leg weakness: R leg still bothers him. Got a cane to walk with - that helps. No falls since coming home from SNF. Legs do get weak though.  HTN: does not check blood pressure at home. Has chest pain "every now and then". Headaches occasionally. Sometimes left leg gets swollen, more so than right.   Chest pain:  Sometimes hurts right in the middle of his chest, worse with exertion. It goes away when he sits down about 1-2 hours after sitting. Associated with SOB. Tries to not overexert himself to avoid the pain. Sometimes gets sweaty with it. Last took nitroglycerin about 2-3 times when he was in the SNF. The last time he had the pain was two days ago. He gets it about 2-3 days of the week and nitroglycerin helps. I refilled this for him back in December, but he hasn't gotten it yet due to confusion at the pharmacy. Has never had a heart attack but has had a stroke (two years ago). Has a hx of afib but no other heart problems, but does have carotid artery disease. Has never been to a cardiologist. He does take a pill for indigestion  Diabetes: gets between 150 up to 196. Checks twice per day, sometimes three times per day. Only has been over 300 one time since coming home from [facility] on January 7. It's been several years since he went to the eye doctor. Requests refill for lancets & test strips for new meter.  Afib: does feel like heart skips beats sometimes, and sometimes speeds up.  Anxiety: He is okay with stopping the klonopin, out of concern for fall risk.   ROS: See HPI  PHYSICAL  EXAM: BP 126/78  Pulse 78  Ht 5' (1.524 m)  Wt 233 lb 6.4 oz (105.87 kg)  BMI 45.58 kg/m2 Gen: NAD, pleasant, cooperative Heart: RRR, 2/6 systolic murmur not previously noted Lungs: CTAB, normal respiratory effort Neuro: nonfocal, speech intact Skin: fewer open areas on skin than previously, stomach has healed up well Ext: two dime-sized superficial skin ulcers on left anterior shin, without signs of infection. Left foot with an area of skin darkening with small crack in skin on lateral plantar aspect of foot. Unclear if darkening is pigmentation vs. dirt

## 2012-08-23 NOTE — Patient Instructions (Addendum)
It was great to see you again today!  Make an appointment to go see your eye doctor. We are referring you to cardiology and will call you with an appointment time.  Keep taking your medicines as prescribed. I am not going to refill your klonopin due to the increased risk of falls with this medicine.  I will call you if your test results are not normal.  Otherwise, I will send you a letter.  If you do not hear from me with in 2 weeks please call our office.     If you have any chest pain that lasts longer than 30 minutes or is not relieved by rest, I want you to go to the emergency room immediately.  Call the clinic if you have any questions or concerns. Schedule an appointment to see me back in 2 weeks.  Be well, Dr. Pollie Meyer

## 2012-08-25 NOTE — Assessment & Plan Note (Addendum)
Pt's description of intermittent chest pain suggests that it may be cardiac in nature. 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 Will refer to cardiology for evaluation of chest pain and further recommendations on reduction of cardiac risk. Precepted with Dr. Sarah Swaziland who knows patient well, and who agrees with this plan.  Counseled patient that if he has any chest pain which lasts longer than 30 minutes he should go to the ER immediately. Instructed him to get the nitroglycerin from his pharmacy.

## 2012-08-25 NOTE — Assessment & Plan Note (Signed)
Auscultated on exam today, not previously noted from what I am able to tell. Would have liked to get CBC to evaluate for anemia which could be contributing to murmur, but patient had already had blood drawn. He is returning for a f/u visit in to see me in 2 weeks so we can check CBC at that time. We are referring to cardiology for evaluation of chest pain, so will defer workup of murmur to cardiologist.

## 2012-08-25 NOTE — Assessment & Plan Note (Signed)
Check direct LDL today - would prefer fasting lipid panel but it is in the afternoon currently, and pt may have some trouble getting here. Goal <70. Currently on atorvastatin 40mg  daily.

## 2012-08-25 NOTE — Assessment & Plan Note (Addendum)
A1c improved to 10.7 today (was 12 in October 2013). Obviously we are still far from goal. CBG's better controlled since d/c from SNF. Will continue current regimen (Lantus 30 units BID and metformin) for now, since pt reports CBG's mostly in the 100s. Poor control likely diet related or possibly non-compliance - pt has MR and has trouble managing medications on his own. Sent in new rx for lancets and test strips to go with the new meter he has. Due for eye exam, so I've instructed him to schedule an appointment with his eye doctor.  Seems to have done well at SNF. Will contact CSW to see what other options are available for assistance with medication (perhaps [facility] case management) versus other long term placement options.

## 2012-08-25 NOTE — Assessment & Plan Note (Addendum)
Spoke with patient about discontinuing klonopin, something we have discussed several times now. He mainly uses it to help him sleep at night. He is at a high fall risk and I am more concerned that he could get sleepy and fall from the klonopin. Should not have trouble coming off it as it has only been prescribed as 0.25mg  every other night. Will no longer be filling this medication for him. He thinks he will be able to go without it. Will continue paxil.  Reassess anxiety and sleep at f/u visit in 2 weeks.

## 2012-08-25 NOTE — Assessment & Plan Note (Addendum)
On diltiazem, metoprolol & pradaxa. EKG today shows NSR, suggesting paroxysmal afib. We are referring to cardiology today due to anginal chest pain - will f/u on their recs re: afib as well.

## 2012-08-25 NOTE — Assessment & Plan Note (Signed)
Previously with hyperkalemia while on ACE, which was then discontinued. Check BMET today.

## 2012-08-27 ENCOUNTER — Encounter: Payer: Self-pay | Admitting: Cardiovascular Disease

## 2012-08-27 ENCOUNTER — Ambulatory Visit (INDEPENDENT_AMBULATORY_CARE_PROVIDER_SITE_OTHER): Payer: No Typology Code available for payment source | Admitting: Cardiovascular Disease

## 2012-08-27 VITALS — BP 122/60 | HR 70 | Resp 19 | Ht 60.0 in | Wt 255.8 lb

## 2012-08-27 DIAGNOSIS — E119 Type 2 diabetes mellitus without complications: Secondary | ICD-10-CM

## 2012-08-27 DIAGNOSIS — R079 Chest pain, unspecified: Secondary | ICD-10-CM

## 2012-08-27 DIAGNOSIS — F17200 Nicotine dependence, unspecified, uncomplicated: Secondary | ICD-10-CM

## 2012-08-27 NOTE — Progress Notes (Signed)
[name] Date of Birth  Jul 25, 1956       [facility] [address]  [address] [place], [place]  [address]   [place], [place]  [address] [phone]     [phone]   Fax  [phone]    Fax [phone]  Problem List: 1. Chest pain 2. Atrial fibrillation 3. Diabetes mellitus 4. Hypertension 5. CVA  History of Present Illness:  [name] is a 57 year old gentleman with the above-noted medical history. He presents today for further evaluation of chest pains.  These pains are not related to exertion or eating and drinking.  These episodes of pain lasts 10-15 minutes. He has occasional sharp chest pains and burning pains .   He takes nitroglycerin on occasion which seemed to help.   he is active around the house but doesn't do any specific exercise.   Current Outpatient Prescriptions on File Prior to Visit  Medication Sig Dispense Refill  . aspirin 81 MG EC tablet Take 81 mg by mouth daily.        . ASSURE LANCETS MISC Check blood sugar before meals and at bedtime  100 each  3  . atorvastatin (LIPITOR) 40 MG tablet Take 1 tablet (40 mg total) by mouth at bedtime.  30 tablet  11  . dabigatran (PRADAXA) 150 MG CAPS Take 1 capsule (150 mg total) by mouth every 12 (twelve) hours.  60 capsule  1  . diltiazem (CARDIZEM CD) 240 MG 24 hr capsule TAKE 1 CAPSULE (240 MG TOTAL) BY MOUTH DAILY. FOR HEART RATE  30 capsule  6  . furosemide (LASIX) 40 MG tablet Take 1 tablet (40 mg total) by mouth daily.  30 tablet  1  . gabapentin (NEURONTIN) 300 MG capsule Take 1 capsule (300 mg total) by mouth 2 (two) times daily.  62 capsule  6  . glucose blood test strip Check blood sugar before meals and at bedtime  100 each  12  . hydrocortisone 2.5 % ointment Apply topically 3 (three) times daily. To leg around wound. Do not apply to broken skin.  30 g  0  . INS SYRINGE/NEEDLE 1CC/28G (B-D INS SYR MICROFINE 1CC/28G) 28G X 1/2" 1 ML MISC  (indulin syringe-needle U-100) Dispense QS for 1 month supply wit daily dosing. dispense 1 box.       . insulin glargine (LANTUS SOLOSTAR) 100 UNIT/ML injection Inject 30 Units into the skin 2 (two) times daily.  3 mL  0  . Insulin Pen Needle (B-D UF III MINI PEN NEEDLES) 31G X 5 MM MISC Use as directed  100 each  6  . metFORMIN (GLUCOPHAGE) 1000 MG tablet       . metoprolol tartrate (LOPRESSOR) 25 MG tablet Take 1 tablet (25 mg total) by mouth 2 (two) times daily.  60 tablet  6  . nitroGLYCERIN (NITROSTAT) 0.4 MG SL tablet Place 1 tablet (0.4 mg total) under the tongue every 5 (five) minutes as needed for chest pain. If still have pain after 2nd pill, GO TO ER.  20 tablet  2  . omeprazole (PRILOSEC) 20 MG capsule Take 20 mg by mouth daily.      Marland Kitchen PARoxetine (PAXIL) 40 MG tablet Take 1 tablet (40 mg total) by mouth daily.  30 tablet  6    Allergies  Allergen Reactions  . Penicillins     Past Medical History  Diagnosis Date  . Hypertension   .  Diabetes mellitus   . Mental retardation     Sister helps to take care of him and takes him to appts  . Tobacco user     Smokes 1ppd for multiple years.  Quit after hosp 09/2010.  Marland Kitchen Anemia     History of Iron Def Anemia  . Anxiety   . Hyperlipidemia   . Atrial fibrillation 06/2009    CHADS score 2 (HTN, DM), was not on coumadin, but now on Pradaxa for Afib  . Lung nodule   . Chronic low back pain   . CVA (cerebral infarction) 09/2010    Bilateral with Left > Right  . Abdominal hernia     Chronic, not a good surgical candidate  . Carotid artery occlusion   . Obesity   . COPD (chronic obstructive pulmonary disease)   . Abscess, abdomen 12/31/2010    Referred to Wound Care in 01/2011 because of multiple abd abscess with VERY large ventral hernia (please look at image of CT abd/pelvis 09/2010).  Because of hernia I was hesitant to I&D.        Past Surgical History  Procedure Date  . Carotid arteriogram 10/2010    30% right ICA stenosis, 40%  left ICA stenosis   . Transesophageal echocardiogram 09/2010    No ASD or PFO. EF 60-65%.  Normal systolic function. No evidence of thrombus.   . Transthoracic echocardiogram 09/2010     The cavity size was normal. Systolic function was vigorous.  EF 65-70%.  Normal wall funciton.     History  Smoking status  . Former Smoker -- 1.0 packs/day for 42 years  . Types: Cigarettes  Smokeless tobacco  . Former Neurosurgeon  . Quit date: 10/02/2010    History  Alcohol Use No    Family History  Problem Relation Age of Onset  . Heart disease Mother   . Hypertension Sister   . Heart disease Sister   . Heart disease Brother   . Heart disease Father   . Peripheral vascular disease Father   . Heart disease Maternal Grandmother   . Heart attack Maternal Grandmother     Reviw of Systems:  Reviewed in the HPI.  All other systems are negative.  Physical Exam: Blood pressure 122/60, pulse 70, resp. rate 19, height 5' (1.524 m), weight 255 lb 12.8 oz (116.03 kg), SpO2 93.00%. General: Well developed, well nourished, in no acute distress.  Head: Normocephalic, atraumatic, sclera non-icteric, mucus membranes are moist,   Neck: Supple. Carotids are 2 + without bruits. No JVD   Lungs: Clear   Heart: RR  Abdomen: Soft, non-tender,    He has a massive ventral hernia.    Msk:  Strength and tone are normal   Extremities: No clubbing or cyanosis. No edema.  Distal pedal pulses are 2+ and equal    Neuro: CN II - XII intact.  Alert and oriented X 3.   Psych:  Normal   ECG: 08/29/2012: Normal sinus rhythm at 71. He has a right bundle branch block.  Assessment / Plan:

## 2012-08-27 NOTE — Patient Instructions (Addendum)
Your physician has requested that you have a lexiscan myoview. Please follow instruction sheet, as given.  Your physician recommends that you continue on your current medications as directed. Please refer to the Current Medication list given to you today.  Your physician recommends that you schedule a follow-up appointment in: AS NEEDED DEPENDING ON TEST RESULTS

## 2012-08-27 NOTE — Assessment & Plan Note (Signed)
[name] presents for further evaluation of his chest discomfort. He does have several risk factors including history of stroke and history of diabetes.  Has a long history of cigarette smoking but stopped approximately 2 years ago when he had his stroke.   We will schedule him for a Lexiscan Myoview study.   If the Myoview is unremarkable then we will have him return to see his medical doctor. If he has significant abnormalities then we may need to consider cardiac catheterization and I'll see him  back for followup visit.

## 2012-08-28 ENCOUNTER — Encounter: Payer: Self-pay | Admitting: Neurosurgery

## 2012-08-29 ENCOUNTER — Encounter: Payer: Self-pay | Admitting: Neurosurgery

## 2012-08-29 ENCOUNTER — Ambulatory Visit (INDEPENDENT_AMBULATORY_CARE_PROVIDER_SITE_OTHER): Payer: No Typology Code available for payment source | Admitting: Neurosurgery

## 2012-08-29 ENCOUNTER — Other Ambulatory Visit (INDEPENDENT_AMBULATORY_CARE_PROVIDER_SITE_OTHER): Payer: No Typology Code available for payment source | Admitting: *Deleted

## 2012-08-29 VITALS — BP 98/49 | HR 56 | Resp 18 | Ht 60.0 in | Wt 236.0 lb

## 2012-08-29 DIAGNOSIS — I6529 Occlusion and stenosis of unspecified carotid artery: Secondary | ICD-10-CM

## 2012-08-29 DIAGNOSIS — R0789 Other chest pain: Secondary | ICD-10-CM

## 2012-08-29 NOTE — Progress Notes (Signed)
[facility] [place] Carotid Office Note  CC: Carotid surveillance Referring Physician: Edilia Bo  History of Present Illness: 57 year old male patient of Dr. Edilia Bo followed for known carotid stenosis. The patient denies any signs or symptoms of CVA, TIA, amaurosis fugax or any neural deficit.  Past Medical History  Diagnosis Date  . Hypertension   . Diabetes mellitus   . Mental retardation     Sister helps to take care of him and takes him to appts  . Tobacco user     Smokes 1ppd for multiple years.  Quit after hosp 09/2010.  Marland Kitchen Anemia     History of Iron Def Anemia  . Anxiety   . Hyperlipidemia   . Atrial fibrillation 06/2009    CHADS score 2 (HTN, DM), was not on coumadin, but now on Pradaxa for Afib  . Lung nodule   . Chronic low back pain   . CVA (cerebral infarction) 09/2010    Bilateral with Left > Right  . Abdominal hernia     Chronic, not a good surgical candidate  . Carotid artery occlusion   . Obesity   . COPD (chronic obstructive pulmonary disease)   . Abscess, abdomen 12/31/2010    Referred to Wound Care in 01/2011 because of multiple abd abscess with VERY large ventral hernia (please look at image of CT abd/pelvis 09/2010).  Because of hernia I was hesitant to I&D.        ROS: [x]  Positive   [ ]  Denies    General: [ ]  Weight loss, [ ]  Fever, [ ]  chills Neurologic: [ ]  Dizziness, [ ]  Blackouts, [ ]  Seizure [ ]  Stroke, [ ]  "Mini stroke", [ ]  Slurred speech, [ ]  Temporary blindness; [ ]  weakness in arms or legs, [ ]  Hoarseness Cardiac: [ ]  Chest pain/pressure, [ ]  Shortness of breath at rest [ ]  Shortness of breath with exertion, [ ]  Atrial fibrillation or irregular heartbeat Vascular: [ ]  Pain in legs with walking, [ ]  Pain in legs at rest, [ ]  Pain in legs at night,  [ ]  Non-healing ulcer, [ ]  Blood clot in vein/DVT,   Pulmonary: [ ]  Home oxygen, [ ]  Productive cough, [ ]  Coughing up blood, [ ]  Asthma,  [ ]  Wheezing Musculoskeletal:  [ ]   Arthritis, [ ]  Low back pain, [ ]  Joint pain Hematologic: [ ]  Easy Bruising, [ ]  Anemia; [ ]  Hepatitis Gastrointestinal: [ ]  Blood in stool, [ ]  Gastroesophageal Reflux/heartburn, [ ]  Trouble swallowing Urinary: [ ]  chronic Kidney disease, [ ]  on HD - [ ]  MWF or [ ]  TTHS, [ ]  Burning with urination, [ ]  Difficulty urinating Skin: [ ]  Rashes, [ ]  Wounds Psychological: [ ]  Anxiety, [ ]  Depression   Social History History  Substance Use Topics  . Smoking status: Former Smoker -- 1.0 packs/day for 42 years    Types: Cigarettes  . Smokeless tobacco: Former Neurosurgeon    Quit date: 10/02/2010  . Alcohol Use: No    Family History Family History  Problem Relation Age of Onset  . Heart disease Mother   . Hypertension Sister   . Heart disease Sister   . Heart disease Brother   . Heart disease Father   . Peripheral vascular disease Father   . Heart disease Maternal Grandmother   . Heart attack Maternal Grandmother     Allergies  Allergen Reactions  . Penicillins Hives and Nausea And Vomiting    Current Outpatient Prescriptions  Medication  Sig Dispense Refill  . aspirin 81 MG EC tablet Take 81 mg by mouth daily.        . ASSURE LANCETS MISC Check blood sugar before meals and at bedtime  100 each  3  . atorvastatin (LIPITOR) 40 MG tablet Take 1 tablet (40 mg total) by mouth at bedtime.  30 tablet  11  . clonazePAM (KLONOPIN) 0.25 MG disintegrating tablet       . dabigatran (PRADAXA) 150 MG CAPS Take 1 capsule (150 mg total) by mouth every 12 (twelve) hours.  60 capsule  1  . diltiazem (CARDIZEM CD) 240 MG 24 hr capsule TAKE 1 CAPSULE (240 MG TOTAL) BY MOUTH DAILY. FOR HEART RATE  30 capsule  6  . furosemide (LASIX) 40 MG tablet Take 1 tablet (40 mg total) by mouth daily.  30 tablet  1  . gabapentin (NEURONTIN) 300 MG capsule Take 1 capsule (300 mg total) by mouth 2 (two) times daily.  62 capsule  6  . glucose blood test strip Check blood sugar before meals and at bedtime  100 each  12  .  hydrocortisone 2.5 % ointment Apply topically 3 (three) times daily. To leg around wound. Do not apply to broken skin.  30 g  0  . INS SYRINGE/NEEDLE 1CC/28G (B-D INS SYR MICROFINE 1CC/28G) 28G X 1/2" 1 ML MISC (indulin syringe-needle U-100) Dispense QS for 1 month supply wit daily dosing. dispense 1 box.       . insulin glargine (LANTUS SOLOSTAR) 100 UNIT/ML injection Inject 30 Units into the skin 2 (two) times daily.  3 mL  0  . Insulin Pen Needle (B-D UF III MINI PEN NEEDLES) 31G X 5 MM MISC Use as directed  100 each  6  . metFORMIN (GLUCOPHAGE) 1000 MG tablet       . metoprolol tartrate (LOPRESSOR) 25 MG tablet Take 1 tablet (25 mg total) by mouth 2 (two) times daily.  60 tablet  6  . nitroGLYCERIN (NITROSTAT) 0.4 MG SL tablet Place 1 tablet (0.4 mg total) under the tongue every 5 (five) minutes as needed for chest pain. If still have pain after 2nd pill, GO TO ER.  20 tablet  2  . omeprazole (PRILOSEC) 20 MG capsule Take 20 mg by mouth daily.      Marland Kitchen PARoxetine (PAXIL) 40 MG tablet Take 1 tablet (40 mg total) by mouth daily.  30 tablet  6    Physical Examination  Filed Vitals:   08/29/12 1559  BP: 98/49  Pulse: 56  Resp:     Body mass index is 46.09 kg/(m^2).  General:  WDWN in NAD Gait: Normal HEENT: WNL Eyes: Pupils equal Pulmonary: normal non-labored breathing , without Rales, rhonchi,  wheezing Cardiac: RRR, without  Murmurs, rubs or gallops; Abdomen: soft, NT, no masses Skin: no rashes, ulcers noted  Vascular Exam Pulses: 3+ radial pulses bilaterally Carotid bruits: Carotid pulses to auscultation no bruits are heard Extremities without ischemic changes, no Gangrene , no cellulitis; no open wounds;  Musculoskeletal: no muscle wasting or atrophy   Neurologic: A&O X 3; Appropriate Affect ; SENSATION: normal; MOTOR FUNCTION:  moving all extremities equally. Speech is fluent/normal  Non-Invasive Vascular Imaging CAROTID DUPLEX 08/29/2012  Right ICA 40 - 59 % stenosis Left  ICA 40 - 59 % stenosis   ASSESSMENT/PLAN: Asymptomatic patient with a slight increase in right ICA stenosis when compared to previous exam. The patient will followup in one year with repeat carotid duplex. The  patient's questions were encouraged and answered, he is in agreement with this plan.  Lauree Chandler ANP   Clinic MD: Edilia Bo

## 2012-09-03 ENCOUNTER — Other Ambulatory Visit: Payer: Self-pay | Admitting: *Deleted

## 2012-09-03 DIAGNOSIS — I6529 Occlusion and stenosis of unspecified carotid artery: Secondary | ICD-10-CM

## 2012-09-04 ENCOUNTER — Encounter (HOSPITAL_COMMUNITY): Payer: No Typology Code available for payment source

## 2012-09-06 ENCOUNTER — Ambulatory Visit (HOSPITAL_COMMUNITY): Payer: No Typology Code available for payment source | Attending: Internal Medicine | Admitting: Radiology

## 2012-09-06 VITALS — BP 140/70 | Ht 60.0 in | Wt 235.0 lb

## 2012-09-06 DIAGNOSIS — R0602 Shortness of breath: Secondary | ICD-10-CM

## 2012-09-06 DIAGNOSIS — J45909 Unspecified asthma, uncomplicated: Secondary | ICD-10-CM | POA: Insufficient documentation

## 2012-09-06 DIAGNOSIS — R002 Palpitations: Secondary | ICD-10-CM | POA: Insufficient documentation

## 2012-09-06 DIAGNOSIS — I10 Essential (primary) hypertension: Secondary | ICD-10-CM | POA: Insufficient documentation

## 2012-09-06 DIAGNOSIS — R0609 Other forms of dyspnea: Secondary | ICD-10-CM | POA: Insufficient documentation

## 2012-09-06 DIAGNOSIS — F17200 Nicotine dependence, unspecified, uncomplicated: Secondary | ICD-10-CM

## 2012-09-06 DIAGNOSIS — E119 Type 2 diabetes mellitus without complications: Secondary | ICD-10-CM | POA: Insufficient documentation

## 2012-09-06 DIAGNOSIS — R079 Chest pain, unspecified: Secondary | ICD-10-CM | POA: Insufficient documentation

## 2012-09-06 DIAGNOSIS — R0989 Other specified symptoms and signs involving the circulatory and respiratory systems: Secondary | ICD-10-CM | POA: Insufficient documentation

## 2012-09-06 MED ORDER — TECHNETIUM TC 99M SESTAMIBI GENERIC - CARDIOLITE
30.0000 | Freq: Once | INTRAVENOUS | Status: AC | PRN
  Administered 2012-09-06: 30 via INTRAVENOUS

## 2012-09-06 MED ORDER — REGADENOSON 0.4 MG/5ML IV SOLN
0.4000 mg | Freq: Once | INTRAVENOUS | Status: AC
  Administered 2012-09-06: 0.4 mg via INTRAVENOUS

## 2012-09-06 MED ORDER — TECHNETIUM TC 99M SESTAMIBI GENERIC - CARDIOLITE
10.0000 | Freq: Once | INTRAVENOUS | Status: AC | PRN
  Administered 2012-09-06: 10 via INTRAVENOUS

## 2012-09-06 NOTE — Progress Notes (Signed)
[facility] SITE 3 NUCLEAR MED [address] [place], [place] [address] [phone]    Cardiology Nuclear Med Study  [name] is a 57 y.o. male     MRN : [id number]     DOB: [birth year]  Procedure Date: 09/06/2012  Nuclear Med Background Indication for Stress Test:  Evaluation for Ischemia and Surgical Clearance- Abdominal Hernia with multiple abscesses, possible  Repair History:  Asthma/COPD, AFIB, '12 Echo: EF=60-65% Cardiac Risk Factors: Carotid Disease, CVA, Family History - CAD, History of Smoking, Hypertension, IDDM Type 2, Lipids and RBBB  Symptoms: Chest Pain  with/without exertion (last occurrence 2 days ago),  Dizziness, DOE, Fatigue, Fatigue with Exertion, Light-Headedness, Near Syncope, Palpitations, Rapid HR and SOB   Nuclear Pre-Procedure Caffeine/Decaff Intake:  None NPO After: 9:30 AM   Lungs:  clear O2 Sat: 96% on room air. IV 0.9% NS with Angio Cath:  22g  IV Site: R Hand  IV Started by:  Bonnita Levan, RN  Chest Size (in):  50 Cup Size: n/a  Height: 5' (1.524 m)  Weight:  235 lb (106.595 kg)  BMI:  Body mass index is 45.90 kg/(m^2). Tech Comments:  Patient held all meds this am, BS @ 12pm = 242    Nuclear Med Study 1 or 2 day study: 1 day  Stress Test Type:  Eugenie Birks  Reading MD: Dietrich Pates, MD  Order Authorizing Provider:  Kristeen Miss, MD  Resting Radionuclide: Technetium 78m Sestamibi  Resting Radionuclide Dose: 11.0 mCi   Stress Radionuclide:  Technetium 7m Sestamibi  Stress Radionuclide Dose: 33.0 mCi           Stress Protocol Rest HR: 73 Stress HR: 82  Rest BP: 140/70 Stress BP: 138/66  Exercise Time (min): n/a METS: n/a   Predicted Max HR: 162 bpm % Max HR: 50.62 bpm Rate Pressure Product: 84696    Dose of Adenosine (mg):  n/a Dose of Lexiscan: 0.4 mg  Dose of Atropine (mg): n/a Dose of Dobutamine: n/a mcg/kg/min (at max HR)  Stress Test Technologist: Irean Hong, RN  Nuclear Technologist:  Domenic Polite, CNMT      Rest Procedure:  Myocardial perfusion imaging was performed at rest 45 minutes following the intravenous administration of Technetium 33m Sestamibi. Rest ECG: NSR-RBBB  Stress Procedure:  The patient received IV Lexiscan 0.4 mg over 15-seconds.  Technetium 24m Sestamibi injected at 30-seconds. The patient complained of nausea, but denies chest pain. Quantitative spect images were obtained after a 45 minute delay. Stress ECG: No significant change from baseline ECG  QPS Raw Data Images:    SOft tissue (diaphragm, bowel acitivity, subcutaneous fat) surround heart. Stress Images:  Apical defect seen in horizontal images only.  Inferior thinning (mid) in short axis views only.  Otherwise normal perfusion. Rest Images:  Improvement in the apical region defect in the short axis views.  Minimal change in the inferior region Subtraction (SDS):  Small region of apical ischemia. Transient Ischemic Dilatation (Normal <1.22):  0.97 Lung/Heart Ratio (Normal <0.45):  0.40  Quantitative Gated Spect Images QGS EDV:  90 ml QGS ESV:  27 ml  Impression Exercise Capacity:  Lexiscan with no exercise. BP Response:  Normal blood pressure response. Clinical Symptoms:  No chest pain. ECG Impression:  No significant ST segment change suggestive of ischemia. Comparison with Prior Nuclear Study: No previous nuclear study performed  Overall Impression:  Myoview scan with small apical defect Seen in horizontal images only.  Cannot exclude tiny region of apical  ischemia vs shift soft tissue.  Otherwise normal perfusion.  Overall low risk scan  LV Ejection Fraction: 69%.  LV Wall Motion:  NL LV Function; NL Wall Motion  Dietrich Pates

## 2012-09-07 ENCOUNTER — Ambulatory Visit: Payer: No Typology Code available for payment source | Admitting: Family Medicine

## 2012-09-13 ENCOUNTER — Encounter: Payer: Self-pay | Admitting: Family Medicine

## 2012-09-13 NOTE — Progress Notes (Signed)
Patient ID: [name], male   DOB: 07/02/1956, 57 y.o.   MRN: [id number]  Patient no-showed to his most recent clinic appointment on 09/07/12. I have messaged our social worker, Theresia Bough, to see if we can get pt connected with the [facility] case managers, as I think they would be a good resource for him in helping him get to appointments, manage medications, etc. Will await CSW's response on how to best proceed with getting him connected.

## 2012-09-18 ENCOUNTER — Telehealth: Payer: Self-pay | Admitting: Clinical

## 2012-09-18 NOTE — Telephone Encounter (Signed)
Clinical Child psychotherapist (CSW) contacted pt sister to explore whether she ever applied for Medicaid for pt. Pt sister stated she had a funeral yesterday and has not been able to apply however plans to go next week. CSW informed sister of importance to get that application in so that pt could qualify for Medicaid which will cover placement. Pt sister stated pt does not need placement as she is able to care for him. CSW informed sister of concerns with wanting to make sure he is medically receiving the care he needs and is able to come to his appointments. CSW explored whether sister rescheduled pt missed appointment and sister stated she has and will come this Friday. CSW informed sister that appointment is not Friday however it is Monday 2/17. Sister has agreed to bring pt to PCP appt. on Monday.   CSW has been informed that pt does not qualify for [facility] as they do not accept pt managed care insurance. CSW will remain available and has encouraged sister to please contact CSW once Medicaid application completed.  Theresia Bough, MSW, Theresia Majors [phone]

## 2012-09-24 ENCOUNTER — Telehealth: Payer: Self-pay | Admitting: Family Medicine

## 2012-09-24 ENCOUNTER — Encounter: Payer: Self-pay | Admitting: Family Medicine

## 2012-09-24 ENCOUNTER — Ambulatory Visit (INDEPENDENT_AMBULATORY_CARE_PROVIDER_SITE_OTHER): Payer: No Typology Code available for payment source | Admitting: Family Medicine

## 2012-09-24 VITALS — BP 138/78 | HR 96 | Ht 60.0 in | Wt 235.6 lb

## 2012-09-24 DIAGNOSIS — Z5320 Procedure and treatment not carried out because of patient's decision for unspecified reasons: Secondary | ICD-10-CM

## 2012-09-24 DIAGNOSIS — Z5321 Procedure and treatment not carried out due to patient leaving prior to being seen by health care provider: Secondary | ICD-10-CM

## 2012-09-24 NOTE — Telephone Encounter (Signed)
Pt left his visit today without being seen. I called him this evening after clinic to apologize that I was running late and that he had to leave. He thanked me and apologized for leaving without telling anyone, he said he just saw that we were busy and says he didn't want to bother [place].  When I asked how he is doing he said he had some pain in his R side all day yesterday, he thinks from sleeping in the wrong position. It hasn't bothered him as much today. He is eating well. I asked about his chest pain - he said it comes and goes, the same as it's been before. He knows to go to the ER if he has pain that does not go away.   He said he will call and schedule another appointment with our clinic.

## 2012-09-24 NOTE — Progress Notes (Signed)
Pt left without being seen. [name], [name]

## 2012-09-27 ENCOUNTER — Other Ambulatory Visit: Payer: Self-pay | Admitting: Family Medicine

## 2012-09-28 ENCOUNTER — Other Ambulatory Visit: Payer: Self-pay | Admitting: Family Medicine

## 2012-09-28 MED ORDER — METFORMIN HCL 1000 MG PO TABS: 1000.0000 mg | ORAL_TABLET | Freq: Two times a day (BID) | ORAL | Status: DC

## 2012-10-09 ENCOUNTER — Ambulatory Visit (INDEPENDENT_AMBULATORY_CARE_PROVIDER_SITE_OTHER): Payer: No Typology Code available for payment source | Admitting: Family Medicine

## 2012-10-09 VITALS — BP 136/74 | HR 70 | Temp 97.7°F | Ht 60.0 in | Wt 235.2 lb

## 2012-10-09 DIAGNOSIS — R0789 Other chest pain: Secondary | ICD-10-CM

## 2012-10-09 DIAGNOSIS — R079 Chest pain, unspecified: Secondary | ICD-10-CM

## 2012-10-09 DIAGNOSIS — E11622 Type 2 diabetes mellitus with other skin ulcer: Secondary | ICD-10-CM

## 2012-10-09 DIAGNOSIS — E1169 Type 2 diabetes mellitus with other specified complication: Secondary | ICD-10-CM

## 2012-10-09 DIAGNOSIS — R011 Cardiac murmur, unspecified: Secondary | ICD-10-CM

## 2012-10-09 DIAGNOSIS — I10 Essential (primary) hypertension: Secondary | ICD-10-CM

## 2012-10-09 DIAGNOSIS — E875 Hyperkalemia: Secondary | ICD-10-CM

## 2012-10-09 DIAGNOSIS — E1165 Type 2 diabetes mellitus with hyperglycemia: Secondary | ICD-10-CM

## 2012-10-09 DIAGNOSIS — IMO0001 Reserved for inherently not codable concepts without codable children: Secondary | ICD-10-CM

## 2012-10-09 DIAGNOSIS — Z0000 Encounter for general adult medical examination without abnormal findings: Secondary | ICD-10-CM

## 2012-10-09 DIAGNOSIS — L97909 Non-pressure chronic ulcer of unspecified part of unspecified lower leg with unspecified severity: Secondary | ICD-10-CM

## 2012-10-09 LAB — CBC
MCH: 23.5 pg — ABNORMAL LOW (ref 26.0–34.0)
MCV: 75 fL — ABNORMAL LOW (ref 78.0–100.0)
Platelets: 266 10*3/uL (ref 150–400)
RBC: 4.68 MIL/uL (ref 4.22–5.81)
RDW: 15.6 % — ABNORMAL HIGH (ref 11.5–15.5)

## 2012-10-09 LAB — BASIC METABOLIC PANEL
BUN: 23 mg/dL (ref 6–23)
Chloride: 100 mEq/L (ref 96–112)
Creat: 1.14 mg/dL (ref 0.50–1.35)
Glucose, Bld: 261 mg/dL — ABNORMAL HIGH (ref 70–99)
Potassium: 5.6 mEq/L — ABNORMAL HIGH (ref 3.5–5.3)

## 2012-10-09 NOTE — Progress Notes (Signed)
Name: [name] Age: 57 y.o. Sex: male  HPI:  Unable to do medication reconciliation - he did not bring medications & doesn't know the names of the medicines.  Chest pain: Recently underwent myoview stress testing by cardiology. Pt reports he never was told the result of his stress test (never got a letter or phone call). He has had some chest pani still. Two weeks ago he took nitroglycerin, had to take it 3 times in one day. The pain lasted about all day. He didn't go to the ER because the third nitroglycerin worked. His chest hurts worse when he is exerting himself and is relieved by resting. He also gets short of breath when walking around and has to stop and rest.  Diabetes: Lantus - is using 30 units BID. Checks blood sugar twice per day. Does write numbers down but didn't bring the numbers with him. Remembers that he got 210 one time. Denies ever getting below 100. Last eye doctor visit was 2-3 years ago per pt.   Health maintenance: doesn't think he's ever had a colonoscopy before. Has noticed some red blood on occasion when he strains hard to have a bowel movement.  Leg sore: thinks his ulcer is doing okay on L leg. R leg hurts sometimes when he walks. The L leg ulcer does weep some.  ROS: See HPI. Gets dizzy if he "doesn't take his medicines".  PMFSH:  Hx of smoking but per medical records, quit after his stroke several years ago  PHYSICAL EXAM: BP 136/74  Pulse 70  Temp(Src) 97.7 F (36.5 C) (Oral)  Ht 5' (1.524 m)  Wt 235 lb 3.2 oz (106.686 kg)  BMI 45.93 kg/m2 Gen: NAD Heart: RRR, 2/6 systolic murmur loudest at RUSB as previously noted Lungs: CTAB, NWOB Neuro: grossly nonfocal, speech intact Ext: LLE with quarter-sized superficial ulcer present on anterior shin, some clear drainage, some chronic-appearing surrounding erythema but no signs of superinfection. Approximately 2-3 surrounding smaller ulcers that also do not apepar infected. 1+ pitting edema present  bilaterally.

## 2012-10-09 NOTE — Patient Instructions (Signed)
It was great to see you again today!  For your heart: I am ordering an echocardiogram, which is an ultrasound that will look at your heart. If you have any chest pain that is not relieved by nitroglycerin or rest, you should go to the ER immediately.  For colon cancer screening: I am referring you to a GI doctor, since you've never had a colonoscopy before.  For your diabetes, schedule a follow up visit in one month so we can recheck your diabetes number. You should also schedule an appointment to see your eye doctor so they can look at your eyes carefully since you have diabetes.  For your leg ulcer, keep it loosely covered to let it heal. If you notice any draining or redness around the ulcer, or have any fevers, come back to the clinic to be seen.  We are checking bloodwork today. I will call you if your test results are not normal.  Otherwise, I will send you a letter.  If you do not hear from me with in 2 weeks please call our office.     You can always call the clinic with any questions or concerns.  Be well, Dr. Pollie Meyer

## 2012-10-10 ENCOUNTER — Telehealth: Payer: Self-pay | Admitting: Family Medicine

## 2012-10-10 ENCOUNTER — Other Ambulatory Visit: Payer: Self-pay | Admitting: Family Medicine

## 2012-10-10 DIAGNOSIS — E875 Hyperkalemia: Secondary | ICD-10-CM

## 2012-10-10 MED ORDER — SODIUM POLYSTYRENE SULFONATE 15 GM/60ML PO SUSP: 15.0000 g | Freq: Once | ORAL | Status: DC

## 2012-10-10 NOTE — Assessment & Plan Note (Signed)
Appears stable on exam today. Some clear fluid draining. No signs of superinfection. Encouraged pt to loosely dress it to allow healing, and not to pick at it (he has a hx of skin picking). Given return precautions per AVS.

## 2012-10-10 NOTE — Assessment & Plan Note (Signed)
Has hx of hyperkalemia within last 6 months, will recheck BMET today to monitor since has not had labs in 2 months.

## 2012-10-10 NOTE — Assessment & Plan Note (Signed)
See "systolic murmur" problem below re: recent unremarkable myoview exam and plan to obtain echo.

## 2012-10-10 NOTE — Assessment & Plan Note (Addendum)
Pt recently underwent unremarkable myoview but continues to have chest pain and SOB with exertion. Since he does have a murmur and has not had a recent echocardiogram (last echo in 2010) will obtain echo to evaluate for structural heart abnormalities. Will also obtain CBC to see if anemia is contributing to murmur.

## 2012-10-10 NOTE — Telephone Encounter (Signed)
Pt's BMET showed a potassium of 5.6. I have precepted this result with Dr. Jennette Kettle, who recommends pt take kayexalate twice per week and have a BMET rechecked in 1-2 weeks. I have also reviewed pt's medications with Dr. [name] (pharmacist) to look for any other cause of hyperkalemia, but we cannot identify a reason.  Called patient to let him know I am sending in a prescription for him for kayexalate, and instructed him to take a dose tonight and another dose on Saturday 3/8. He will come in on 3/11 in the afternoon after getting an echocardiogram done for a lab appointment to have a BMET checked. Explained to him that the kayexalate will make him have a bowel movement.  With pt's permission, I also spoke with his sister, Bonita Quin, and his niece, Toniann Fail, to clarify this plan. They will be able to pick up the kayexalate tonight. Toniann Fail manages all of pt's medications  I also clarified with Toniann Fail that pt should NOT be taking his lisinopril anymore. This medication was discontinued several months ago when he had hyperkalemia. Toniann Fail will look at his medications to be sure he is no longer taking this medication (she could not say for certain, but is fairly sure that she threw out his lisinopril bottle).  Patient's cell phone number: [phone] Wendy's phone number: [phone]

## 2012-10-10 NOTE — Assessment & Plan Note (Addendum)
Pt has never had colonoscopy. Given complex medical history, he will require an actual referral to GI for screening colonoscopy, will order this today.

## 2012-10-10 NOTE — Assessment & Plan Note (Signed)
Last A1c in January was 10.7, so pt is not due for another a1c until next month. Pt is an unreliable historian regarding his glucose control. In the past we have gotten him admitted for a short while to a SNF, but in my opinion he would be best served in an intensive outpatient program if he does not qualify for long term placement. Will message social worker regarding possible referral to [facility] program. Recommended to pt that he see his eye doctor. F/u in 1 month for a1c.

## 2012-10-14 ENCOUNTER — Other Ambulatory Visit: Payer: Self-pay | Admitting: Family Medicine

## 2012-10-16 ENCOUNTER — Ambulatory Visit (HOSPITAL_COMMUNITY)
Admission: RE | Admit: 2012-10-16 | Discharge: 2012-10-16 | Disposition: A | Discharge status: Home / Self Care | Payer: Medicaid Other | Source: Ambulatory Visit | Attending: Family Medicine | Admitting: Family Medicine

## 2012-10-16 ENCOUNTER — Other Ambulatory Visit: Payer: Self-pay | Admitting: Family Medicine

## 2012-10-16 ENCOUNTER — Emergency Department (HOSPITAL_COMMUNITY): Payer: No Typology Code available for payment source

## 2012-10-16 ENCOUNTER — Telehealth: Payer: Self-pay | Admitting: Family Medicine

## 2012-10-16 ENCOUNTER — Other Ambulatory Visit: Payer: No Typology Code available for payment source

## 2012-10-16 ENCOUNTER — Emergency Department (HOSPITAL_COMMUNITY)
Admission: EM | Admit: 2012-10-16 | Discharge: 2012-10-16 | Disposition: A | Discharge status: Home / Self Care | Payer: No Typology Code available for payment source | Attending: Emergency Medicine | Admitting: Emergency Medicine

## 2012-10-16 ENCOUNTER — Encounter (HOSPITAL_COMMUNITY): Payer: Self-pay | Admitting: Emergency Medicine

## 2012-10-16 DIAGNOSIS — M545 Low back pain, unspecified: Secondary | ICD-10-CM | POA: Insufficient documentation

## 2012-10-16 DIAGNOSIS — F411 Generalized anxiety disorder: Secondary | ICD-10-CM | POA: Insufficient documentation

## 2012-10-16 DIAGNOSIS — R079 Chest pain, unspecified: Secondary | ICD-10-CM | POA: Insufficient documentation

## 2012-10-16 DIAGNOSIS — J449 Chronic obstructive pulmonary disease, unspecified: Secondary | ICD-10-CM | POA: Insufficient documentation

## 2012-10-16 DIAGNOSIS — Z8709 Personal history of other diseases of the respiratory system: Secondary | ICD-10-CM | POA: Insufficient documentation

## 2012-10-16 DIAGNOSIS — Z794 Long term (current) use of insulin: Secondary | ICD-10-CM | POA: Insufficient documentation

## 2012-10-16 DIAGNOSIS — F79 Unspecified intellectual disabilities: Secondary | ICD-10-CM | POA: Insufficient documentation

## 2012-10-16 DIAGNOSIS — Z87891 Personal history of nicotine dependence: Secondary | ICD-10-CM | POA: Insufficient documentation

## 2012-10-16 DIAGNOSIS — R42 Dizziness and giddiness: Secondary | ICD-10-CM | POA: Insufficient documentation

## 2012-10-16 DIAGNOSIS — R141 Gas pain: Secondary | ICD-10-CM | POA: Insufficient documentation

## 2012-10-16 DIAGNOSIS — R1110 Vomiting, unspecified: Secondary | ICD-10-CM

## 2012-10-16 DIAGNOSIS — Z8639 Personal history of other endocrine, nutritional and metabolic disease: Secondary | ICD-10-CM | POA: Insufficient documentation

## 2012-10-16 DIAGNOSIS — E785 Hyperlipidemia, unspecified: Secondary | ICD-10-CM | POA: Insufficient documentation

## 2012-10-16 DIAGNOSIS — Z8679 Personal history of other diseases of the circulatory system: Secondary | ICD-10-CM | POA: Insufficient documentation

## 2012-10-16 DIAGNOSIS — R109 Unspecified abdominal pain: Secondary | ICD-10-CM | POA: Insufficient documentation

## 2012-10-16 DIAGNOSIS — Z8673 Personal history of transient ischemic attack (TIA), and cerebral infarction without residual deficits: Secondary | ICD-10-CM | POA: Insufficient documentation

## 2012-10-16 DIAGNOSIS — R112 Nausea with vomiting, unspecified: Secondary | ICD-10-CM | POA: Insufficient documentation

## 2012-10-16 DIAGNOSIS — R142 Eructation: Secondary | ICD-10-CM | POA: Insufficient documentation

## 2012-10-16 DIAGNOSIS — E119 Type 2 diabetes mellitus without complications: Secondary | ICD-10-CM | POA: Insufficient documentation

## 2012-10-16 DIAGNOSIS — Z7982 Long term (current) use of aspirin: Secondary | ICD-10-CM | POA: Insufficient documentation

## 2012-10-16 DIAGNOSIS — I4891 Unspecified atrial fibrillation: Secondary | ICD-10-CM | POA: Insufficient documentation

## 2012-10-16 DIAGNOSIS — J4489 Other specified chronic obstructive pulmonary disease: Secondary | ICD-10-CM | POA: Insufficient documentation

## 2012-10-16 DIAGNOSIS — Z79899 Other long term (current) drug therapy: Secondary | ICD-10-CM | POA: Insufficient documentation

## 2012-10-16 DIAGNOSIS — R51 Headache: Secondary | ICD-10-CM | POA: Insufficient documentation

## 2012-10-16 DIAGNOSIS — Z8719 Personal history of other diseases of the digestive system: Secondary | ICD-10-CM | POA: Insufficient documentation

## 2012-10-16 DIAGNOSIS — G8929 Other chronic pain: Secondary | ICD-10-CM | POA: Insufficient documentation

## 2012-10-16 DIAGNOSIS — I10 Essential (primary) hypertension: Secondary | ICD-10-CM | POA: Insufficient documentation

## 2012-10-16 DIAGNOSIS — Z872 Personal history of diseases of the skin and subcutaneous tissue: Secondary | ICD-10-CM | POA: Insufficient documentation

## 2012-10-16 DIAGNOSIS — E669 Obesity, unspecified: Secondary | ICD-10-CM | POA: Insufficient documentation

## 2012-10-16 DIAGNOSIS — Z862 Personal history of diseases of the blood and blood-forming organs and certain disorders involving the immune mechanism: Secondary | ICD-10-CM | POA: Insufficient documentation

## 2012-10-16 LAB — CBC WITH DIFFERENTIAL/PLATELET
Basophils Absolute: 0 10*3/uL (ref 0.0–0.1)
Basophils Relative: 1 % (ref 0–1)
Eosinophils Relative: 4 % (ref 0–5)
HCT: 34.3 % — ABNORMAL LOW (ref 39.0–52.0)
Hemoglobin: 11 g/dL — ABNORMAL LOW (ref 13.0–17.0)
MCHC: 32.1 g/dL (ref 30.0–36.0)
MCV: 75.9 fL — ABNORMAL LOW (ref 78.0–100.0)
Monocytes Absolute: 0.5 10*3/uL (ref 0.1–1.0)
Monocytes Relative: 7 % (ref 3–12)
Neutro Abs: 5.3 10*3/uL (ref 1.7–7.7)
RDW: 14.9 % (ref 11.5–15.5)

## 2012-10-16 LAB — URINALYSIS, ROUTINE W REFLEX MICROSCOPIC
Bilirubin Urine: NEGATIVE
Ketones, ur: NEGATIVE mg/dL
Nitrite: NEGATIVE
Specific Gravity, Urine: 1.017 (ref 1.005–1.030)
pH: 5.5 (ref 5.0–8.0)

## 2012-10-16 LAB — POCT I-STAT, CHEM 8
BUN: 19 mg/dL (ref 6–23)
Creatinine, Ser: 0.9 mg/dL (ref 0.50–1.35)
Glucose, Bld: 277 mg/dL — ABNORMAL HIGH (ref 70–99)
Hemoglobin: 11.6 g/dL — ABNORMAL LOW (ref 13.0–17.0)
Potassium: 4.6 mEq/L (ref 3.5–5.1)
Sodium: 136 mEq/L (ref 135–145)

## 2012-10-16 LAB — URINE MICROSCOPIC-ADD ON

## 2012-10-16 MED ORDER — ONDANSETRON 4 MG PO TBDP
4.0000 mg | ORAL_TABLET | Freq: Once | ORAL | Status: AC
  Administered 2012-10-16: 4 mg via ORAL
  Filled 2012-10-16: qty 1

## 2012-10-16 MED ORDER — DABIGATRAN ETEXILATE MESYLATE 150 MG PO CAPS: 150.0000 mg | ORAL_CAPSULE | Freq: Two times a day (BID) | ORAL | Status: DC

## 2012-10-16 MED ORDER — SODIUM CHLORIDE 0.9 % IV BOLUS (SEPSIS)
1000.0000 mL | Freq: Once | INTRAVENOUS | Status: AC
  Administered 2012-10-16: 1000 mL via INTRAVENOUS

## 2012-10-16 MED ORDER — METOPROLOL TARTRATE 25 MG PO TABS: 25.0000 mg | ORAL_TABLET | Freq: Two times a day (BID) | ORAL | Status: DC

## 2012-10-16 MED ORDER — ONDANSETRON HCL 4 MG PO TABS: 4.0000 mg | ORAL_TABLET | Freq: Three times a day (TID) | ORAL | Status: DC | PRN

## 2012-10-16 NOTE — Progress Notes (Signed)
Echocardiogram 2D Echocardiogram has been performed.  BROWN, CALEBA 10/16/2012, 2:12 PM

## 2012-10-16 NOTE — ED Notes (Signed)
CBG 256  °

## 2012-10-16 NOTE — ED Provider Notes (Signed)
History     CSN: [id number]  Arrival date & time 10/16/12  9562   First MD Initiated Contact with Patient 10/16/12 [id number]      Chief Complaint  Patient presents with  . Nausea    (Consider location/radiation/quality/duration/timing/severity/associated sxs/prior treatment) HPI Comments: 57 y.o. Male presents with nausea and vomiting since Sat. BIBEMS. Tried to eat breakfast this morning and threw up. Has been unable to keep much down since Sat. Admits some dizziness and moderate central abdominal pain, that comes and goes, sharp pains that are 8/10. Has resolved. HA (took [facility] on Sunday, it helped), intemittent chest tightness (pt being followed for this now by PCP, has Echo scheduled for later in the day to investigate new systolic murmur). Denies shortness of breath, fever, constipation, diarrhea.  Last normal bowel movement was yesterday.  Hx of DM, MR, CVA, a fib, obesity, anemia.   Pt recently put on kayexalate (10/10/12) for hyperkalemia (BMET on 3/5) of unknown origin. Also supposed to have GI follow up and colonoscopy (referred on 10/09/12)     Past Medical History  Diagnosis Date  . Hypertension   . Diabetes mellitus   . Mental retardation     Sister helps to take care of him and takes him to appts  . Tobacco user     Smokes 1ppd for multiple years.  Quit after hosp 09/2010.  Marland Kitchen Anemia     History of Iron Def Anemia  . Anxiety   . Hyperlipidemia   . Atrial fibrillation 06/2009    CHADS score 2 (HTN, DM), was not on coumadin, but now on Pradaxa for Afib  . Lung nodule   . Chronic low back pain   . CVA (cerebral infarction) 09/2010    Bilateral with Left > Right  . Abdominal hernia     Chronic, not a good surgical candidate  . Carotid artery occlusion   . Obesity   . COPD (chronic obstructive pulmonary disease)   . Abscess, abdomen 12/31/2010    Referred to Wound Care in 01/2011 because of multiple abd abscess with VERY large ventral hernia (please look at image of CT  abd/pelvis 09/2010).  Because of hernia I was hesitant to I&D.        Past Surgical History  Procedure Laterality Date  . Carotid arteriogram  10/2010    30% right ICA stenosis, 40% left ICA stenosis   . Transesophageal echocardiogram  09/2010    No ASD or PFO. EF 60-65%.  Normal systolic function. No evidence of thrombus.   . Transthoracic echocardiogram  09/2010     The cavity size was normal. Systolic function was vigorous.  EF 65-70%.  Normal wall funciton.     Family History  Problem Relation Age of Onset  . Heart disease Mother   . Hypertension Sister   . Heart disease Sister   . Heart disease Brother   . Heart disease Father   . Peripheral vascular disease Father   . Heart disease Maternal Grandmother   . Heart attack Maternal Grandmother     History  Substance Use Topics  . Smoking status: Former Smoker -- 1.00 packs/day for 42 years    Types: Cigarettes  . Smokeless tobacco: Former Neurosurgeon    Quit date: 10/02/2010  . Alcohol Use: No      Review of Systems  Constitutional: Negative for fever and diaphoresis.  HENT: Negative for neck pain and neck stiffness.   Eyes: Negative for visual disturbance.  Respiratory: Negative for  apnea, chest tightness and shortness of breath.   Cardiovascular: Negative for chest pain and palpitations.  Gastrointestinal: Positive for nausea, vomiting, abdominal pain and abdominal distention. Negative for diarrhea and constipation.       Diffuse, central. Right sided distention pt states is baseline.  Genitourinary: Negative for dysuria and decreased urine volume.  Musculoskeletal: Negative for gait problem.  Skin: Positive for wound.       Skin ulcers noted left pre-tibial, right abdominal  Neurological: Positive for dizziness and headaches. Negative for weakness, light-headedness and numbness.    Allergies  Penicillins  Home Medications   Current Outpatient Rx  Name  Route  Sig  Dispense  Refill  . aspirin 81 MG EC tablet    Oral   Take 81 mg by mouth daily.           Sharia Reeve LANCETS MISC      Check blood sugar before meals and at bedtime   100 each   3     For assure premier meter   . atorvastatin (LIPITOR) 40 MG tablet      TAKE 1 TABLET AT BEDTIME   30 tablet   1   . clonazePAM (KLONOPIN) 0.25 MG disintegrating tablet               . dabigatran (PRADAXA) 150 MG CAPS   Oral   Take 1 capsule (150 mg total) by mouth every 12 (twelve) hours.   60 capsule   1   . diltiazem (CARDIZEM CD) 240 MG 24 hr capsule      TAKE ONE CAPSULE EVERY DAY   30 capsule   2   . furosemide (LASIX) 40 MG tablet      TAKE 1 TABLET EVERY DAY   30 tablet   1   . gabapentin (NEURONTIN) 300 MG capsule      TAKE 1 CAPSULE TWICE A DAY   60 capsule   2   . glucose blood test strip      Check blood sugar before meals and at bedtime   100 each   12     For assure premier meter   . hydrocortisone 2.5 % ointment   Topical   Apply topically 3 (three) times daily. To leg around wound. Do not apply to broken skin.   30 g   0   . INS SYRINGE/NEEDLE 1CC/28G (B-D INS SYR MICROFINE 1CC/28G) 28G X 1/2" 1 ML MISC      (indulin syringe-needle U-100) Dispense QS for 1 month supply wit daily dosing. dispense 1 box.          . insulin glargine (LANTUS SOLOSTAR) 100 UNIT/ML injection   Subcutaneous   Inject 30 Units into the skin 2 (two) times daily.   3 mL   0   . Insulin Pen Needle (B-D UF III MINI PEN NEEDLES) 31G X 5 MM MISC      Use as directed   100 each   6   . metFORMIN (GLUCOPHAGE) 1000 MG tablet   Oral   Take 1 tablet (1,000 mg total) by mouth 2 (two) times daily with a meal.   60 tablet   1   . metoprolol tartrate (LOPRESSOR) 25 MG tablet   Oral   Take 1 tablet (25 mg total) by mouth 2 (two) times daily.   60 tablet   6   . nitroGLYCERIN (NITROSTAT) 0.4 MG SL tablet   Sublingual   Place 1 tablet (0.4 mg  total) under the tongue every 5 (five) minutes as needed for chest pain. If  still have pain after 2nd pill, GO TO ER.   20 tablet   2   . omeprazole (PRILOSEC) 20 MG capsule   Oral   Take 20 mg by mouth daily.         Marland Kitchen PARoxetine (PAXIL) 40 MG tablet   Oral   Take 1 tablet (40 mg total) by mouth daily.   30 tablet   6   . sodium polystyrene (KAYEXALATE) 15 GM/60ML suspension   Oral   Take 60 mLs (15 g total) by mouth once. Take once today (10/10/2012) and repeat on 10/13/2012.   120 mL   0     BP 154/78  Temp(Src) 97.8 F (36.6 C) (Oral)  Ht 5' (1.524 m)  Wt 235 lb (106.595 kg)  BMI 45.9 kg/m2  SpO2 97%  Physical Exam  Nursing note and vitals reviewed. Constitutional: He is oriented to person, place, and time. He appears well-developed and well-nourished. No distress.  Pt in good spirits, in NAD  HENT:  Head: Normocephalic and atraumatic.  Mouth/Throat: Mucous membranes are dry. Oropharyngeal exudate present.  Eyes: EOM are normal. Pupils are equal, round, and reactive to light.  Neck: Normal range of motion. Neck supple.  No meningeal signs  Cardiovascular: Normal rate, regular rhythm and intact distal pulses.   Pulmonary/Chest: Effort normal. No respiratory distress. He has no wheezes. He has rales.  Mildly coarse lung sounds  Abdominal: Soft. Bowel sounds are normal. He exhibits distension. There is no tenderness. There is no rebound and no guarding.  right sided distention with 2 healing ulcers.  Musculoskeletal: Normal range of motion. He exhibits no edema and no tenderness.  5/5 strength throughout. Ambulates well  Neurological: He is alert and oriented to person, place, and time. No cranial nerve deficit.  No focal deficits. Sensation to light touch intact.   Skin: Skin is warm and dry. He is not diaphoretic. No erythema.  Healing skin ulcers noted to right side of abdomen and left pre-tibial area.   Psychiatric:  Hx of MR    ED Course  Procedures (including critical care time) Date: 10/16/2012  Rate: 59  Rhythm: normal sinus  rhythm  QRS Axis: unable to determine Intervals: normal  ST/T Wave abnormalities: nonspecific ST changes  Conduction Disutrbances: right bundle branch block  Narrative Interpretation: Low voltage, sinus rhythm Old EKG Reviewed: No significant changes (08/23/2012)   Results for orders placed during the hospital encounter of 10/16/12  CBC WITH DIFFERENTIAL      Result Value Range   WBC 7.7  4.0 - 10.5 K/uL   RBC 4.52  4.22 - 5.81 MIL/uL   Hemoglobin 11.0 (*) 13.0 - 17.0 g/dL   HCT 95.2 (*) 84.1 - 32.4 %   MCV 75.9 (*) 78.0 - 100.0 fL   MCH 24.3 (*) 26.0 - 34.0 pg   MCHC 32.1  30.0 - 36.0 g/dL   RDW 40.1  02.7 - 25.3 %   Platelets 249  150 - 400 K/uL   Neutrophils Relative 69  43 - 77 %   Neutro Abs 5.3  1.7 - 7.7 K/uL   Lymphocytes Relative 20  12 - 46 %   Lymphs Abs 1.5  0.7 - 4.0 K/uL   Monocytes Relative 7  3 - 12 %   Monocytes Absolute 0.5  0.1 - 1.0 K/uL   Eosinophils Relative 4  0 - 5 %  Eosinophils Absolute 0.3  0.0 - 0.7 K/uL   Basophils Relative 1  0 - 1 %   Basophils Absolute 0.0  0.0 - 0.1 K/uL  TROPONIN I      Result Value Range   Troponin I <0.30  <0.30 ng/mL  GLUCOSE, CAPILLARY      Result Value Range   Glucose-Capillary 256 (*) 70 - 99 mg/dL  POCT I-STAT, CHEM 8      Result Value Range   Sodium 136  135 - 145 mEq/L   Potassium 4.6  3.5 - 5.1 mEq/L   Chloride 98  96 - 112 mEq/L   BUN 19  6 - 23 mg/dL   Creatinine, Ser 4.09  0.50 - 1.35 mg/dL   Glucose, Bld 811 (*) 70 - 99 mg/dL   Calcium, Ion 9.14 (*) 1.12 - 1.23 mmol/L   TCO2 30  0 - 100 mmol/L   Hemoglobin 11.6 (*) 13.0 - 17.0 g/dL   HCT 78.2 (*) 95.6 - 21.3 %    Dg Chest 2 View  10/16/2012  *RADIOLOGY REPORT*  Clinical Data: Cough, shortness of breath  CHEST - 2 VIEW  Comparison: 02/13/2011  Findings: Chronic interstitial markings.  No focal consolidation. No pleural effusion or pneumothorax.  The heart is normal in size.  Degenerative changes of the visualized thoracolumbar spine.  IMPRESSION: No  evidence of acute cardiopulmonary disease.   Original Report Authenticated By: Charline Bills, M.D.     Labs Reviewed - No data to display No results found.   No diagnosis found.    MDM  Pt hx includes DM, MR, CVA, afib, new onset heart murmur for which pt is supposed to get echo later today. Underwent myoview stress test 3/4 which was negative. Recently dx w hyperkalemia (5.6 on 3/5 and taking kayexalate). Will get basic labs and EKG d/t pt hx. IVF.  EKG shows no acute changes from previous EKG. Persistent low voltage. Labs are unremarkable. Potassium is WNL. CXR shows no focal consolidation, no pleural effusion. Pt feeling better on re-evaluation. Will allow fluids to finish and d/c home.   At this time there does not appear to be any evidence of an acute emergency medical condition and the patient appears stable for discharge with appropriate outpatient follow up.Diagnosis was discussed with patient who verbalizes understanding and is agreeable to discharge. Pt case discussed with and seen by Dr. Hyacinth Meeker who agrees with my plan.    Glade Nurse, PA-C 10/16/12 1623

## 2012-10-16 NOTE — Telephone Encounter (Signed)
Sister is calling to let Dr. Pollie Meyer know that she had to call an ambulance for her brother.  She did not say what happened, she just asked to let his doctor know.  I asked her to call us back with a room number if he is admitted as he is still in the ER as far as she knew.

## 2012-10-16 NOTE — ED Notes (Signed)
N/v x 1 week pt ambulatory to rm via ems 180/90 cbg 232  Pt has hx of diabetes  Has no meds yet

## 2012-10-16 NOTE — ED Provider Notes (Signed)
Medical screening examination/treatment/procedure(s) were conducted as a shared visit with non-physician practitioner(s) and myself.  I personally evaluated the patient during the encounter  Please see my separate respective documentation pertaining to this patient encounter   Vida Roller, MD 10/16/12 2149

## 2012-10-16 NOTE — ED Notes (Signed)
Pt here complaining of nausea/vomiting x 3 days. Pt denies any abdominal pain.

## 2012-10-16 NOTE — ED Provider Notes (Signed)
57 year old male with a history of diabetes, hypertension, anemia and persistent hyperkalemia for an unknown reason who is also mildly mentally retarded and obese. He reports Several days of nausea and vomiting, no diarrhea, no abdominal pain or fevers, minimal coughing, no swelling or new rashes. Nothing seems to make this better or worse, on exam he has a soft obese abdomen without any tenderness or masses, clear heart sounds, clear lung sounds, very soft systolic murmur was auscultated. No peripheral edema, moves all extremities x4 with normal strength and coordination, mucous membranes appear mildly dehydrated, there is a mild amount of exudate on the tonsils bilaterally but they do not appear erythematous asymmetrical or hypertrophy. Lung sounds with the occasional rale but this clears with coughing. Overall the patient appears nontoxic, he is joking and laughing during this encounter, vital signs as below, proceed with workup for causes of nonspecific nausea and vomiting.  Filed Vitals:   10/16/12 0737  BP: 154/78  Temp: 97.8 F (36.6 C)  TempSrc: Oral  Height: 5' (1.524 m)  Weight: 235 lb (106.595 kg)  SpO2: 97%   ED ECG REPORT  I personally interpreted this EKG   Date: 10/16/2012   Rate: 59  Rhythm: normal sinus rhythm  QRS Axis: indeterminate  Intervals: normal  ST/T Wave abnormalities: nonspecific ST/T changes  Conduction Disutrbances:Borderline right bundle branch block  Narrative Interpretation: Low voltage  Old EKG Reviewed: Compared with 08/23/2012, no significant changes seen, persistent low voltage   Pt improved with meds and time.  No other acute findings to suggest another source.  Medical screening examination/treatment/procedure(s) were conducted as a shared visit with non-physician practitioner(s) and myself.  I personally evaluated the patient during the encounter     Vida Roller, MD 10/16/12 2148

## 2012-10-19 ENCOUNTER — Other Ambulatory Visit: Payer: Self-pay | Admitting: Family Medicine

## 2012-10-19 DIAGNOSIS — E119 Type 2 diabetes mellitus without complications: Secondary | ICD-10-CM

## 2012-10-19 MED ORDER — INSULIN GLARGINE 100 UNIT/ML ~~LOC~~ SOLN: 30.0000 [IU] | Freq: Two times a day (BID) | SUBCUTANEOUS | Status: DC

## 2012-10-20 ENCOUNTER — Other Ambulatory Visit: Payer: Self-pay | Admitting: Family Medicine

## 2012-10-22 ENCOUNTER — Telehealth: Payer: Self-pay | Admitting: Family Medicine

## 2012-10-22 ENCOUNTER — Other Ambulatory Visit: Payer: Self-pay | Admitting: Family Medicine

## 2012-10-22 NOTE — Telephone Encounter (Signed)
Called pt's cell phone number to discuss referral to PACE program. Explained to him briefly what the PACE program does. He gave me permission to refer him to this program.  Will complete form and fax it to the program.  Additionally, pt reports he has felt sick and was throwing up previously but is feeling a little better now. He had decreased urine output yesterday but has peed well today. Encouraged him to call for an appointment if he needs to be seen.

## 2012-10-23 ENCOUNTER — Other Ambulatory Visit: Payer: Self-pay | Admitting: Family Medicine

## 2012-10-23 MED ORDER — PAROXETINE HCL 40 MG PO TABS: 40.0000 mg | ORAL_TABLET | Freq: Every day | ORAL | Status: DC

## 2012-10-24 ENCOUNTER — Encounter: Payer: Self-pay | Admitting: *Deleted

## 2012-11-06 ENCOUNTER — Telehealth: Payer: Self-pay | Admitting: *Deleted

## 2012-11-06 ENCOUNTER — Telehealth: Payer: Self-pay | Admitting: Family Medicine

## 2012-11-06 NOTE — Telephone Encounter (Signed)
REVIEWED CHART, PT WAS GIVEN RESULTS BY PCP PER NOTATION.

## 2012-11-06 NOTE — Telephone Encounter (Signed)
Unsure, if I can discuss with pt's sister. Will fwd. To PCP for advise. Lorenda Hatchet, Renato Battles

## 2012-11-06 NOTE — Telephone Encounter (Signed)
Sister is calling because she needs to know if her brother is still on blood thinners before she make and appt with [facility].  Also, she needs to  know if he is supposed to be on a Calcium pill.

## 2012-11-06 NOTE — Telephone Encounter (Signed)
The myoview study is a low risk study. He should be able to go for his hernia repair and be at low risk for CV complications -----PER DR NAHSER   Message ----- From: Antony Odea, RN Sent: 11/02/2012 4:42 PM To: Vesta Mixer, MD NOT SIGNED OFF ON THAT YOU REVIEWED

## 2012-11-07 NOTE — Telephone Encounter (Signed)
Called and spoke with Bonita Quin and answered her questions. Bravlio has given me permission to speak with Bonita Quin about his care in the past. She is his primary caregiver as he has mental retardation.  She needed to know if he is on a blood thinner in order to schedule a visit with (I think) [facility]. Also had a question about him being on calcium, which I am not aware of him being on. The PACE program is planning to visit today to meet with Bonita Quin. I am hopeful that [facility] will be able to get set up with this program as it can offer great assistance to patients with chronic medical conditions.

## 2012-11-09 ENCOUNTER — Other Ambulatory Visit: Payer: Self-pay

## 2012-11-09 ENCOUNTER — Emergency Department (HOSPITAL_COMMUNITY): Payer: No Typology Code available for payment source

## 2012-11-09 ENCOUNTER — Encounter (HOSPITAL_COMMUNITY): Payer: Self-pay | Admitting: Emergency Medicine

## 2012-11-09 ENCOUNTER — Ambulatory Visit (HOSPITAL_COMMUNITY)
Admission: RE | Admit: 2012-11-09 | Discharge: 2012-11-09 | Disposition: A | Discharge status: Home / Self Care | Payer: No Typology Code available for payment source | Source: Ambulatory Visit | Attending: Family Medicine | Admitting: Family Medicine

## 2012-11-09 ENCOUNTER — Observation Stay (HOSPITAL_COMMUNITY)
Admission: EM | Admit: 2012-11-09 | Discharge: 2012-11-11 | Disposition: A | Discharge status: Home Health | Payer: No Typology Code available for payment source | Attending: Family Medicine | Admitting: Family Medicine

## 2012-11-09 ENCOUNTER — Inpatient Hospital Stay (HOSPITAL_COMMUNITY)
Admission: AD | Admit: 2012-11-09 | Payer: No Typology Code available for payment source | Source: Ambulatory Visit | Admitting: Family Medicine

## 2012-11-09 ENCOUNTER — Ambulatory Visit (INDEPENDENT_AMBULATORY_CARE_PROVIDER_SITE_OTHER): Payer: No Typology Code available for payment source | Admitting: Family Medicine

## 2012-11-09 VITALS — BP 148/67 | HR 77 | Temp 99.1°F | Ht 60.0 in | Wt 236.0 lb

## 2012-11-09 DIAGNOSIS — R9431 Abnormal electrocardiogram [ECG] [EKG]: Secondary | ICD-10-CM | POA: Insufficient documentation

## 2012-11-09 DIAGNOSIS — I639 Cerebral infarction, unspecified: Secondary | ICD-10-CM

## 2012-11-09 DIAGNOSIS — D509 Iron deficiency anemia, unspecified: Secondary | ICD-10-CM | POA: Insufficient documentation

## 2012-11-09 DIAGNOSIS — L02219 Cutaneous abscess of trunk, unspecified: Secondary | ICD-10-CM

## 2012-11-09 DIAGNOSIS — I4891 Unspecified atrial fibrillation: Secondary | ICD-10-CM | POA: Insufficient documentation

## 2012-11-09 DIAGNOSIS — R0609 Other forms of dyspnea: Secondary | ICD-10-CM | POA: Insufficient documentation

## 2012-11-09 DIAGNOSIS — L02419 Cutaneous abscess of limb, unspecified: Secondary | ICD-10-CM | POA: Insufficient documentation

## 2012-11-09 DIAGNOSIS — I10 Essential (primary) hypertension: Secondary | ICD-10-CM | POA: Insufficient documentation

## 2012-11-09 DIAGNOSIS — E11622 Type 2 diabetes mellitus with other skin ulcer: Secondary | ICD-10-CM

## 2012-11-09 DIAGNOSIS — E119 Type 2 diabetes mellitus without complications: Secondary | ICD-10-CM

## 2012-11-09 DIAGNOSIS — E1165 Type 2 diabetes mellitus with hyperglycemia: Secondary | ICD-10-CM

## 2012-11-09 DIAGNOSIS — L03119 Cellulitis of unspecified part of limb: Secondary | ICD-10-CM | POA: Insufficient documentation

## 2012-11-09 DIAGNOSIS — F411 Generalized anxiety disorder: Secondary | ICD-10-CM | POA: Insufficient documentation

## 2012-11-09 DIAGNOSIS — L97909 Non-pressure chronic ulcer of unspecified part of unspecified lower leg with unspecified severity: Secondary | ICD-10-CM | POA: Diagnosis present

## 2012-11-09 DIAGNOSIS — R0989 Other specified symptoms and signs involving the circulatory and respiratory systems: Secondary | ICD-10-CM | POA: Insufficient documentation

## 2012-11-09 DIAGNOSIS — J449 Chronic obstructive pulmonary disease, unspecified: Secondary | ICD-10-CM | POA: Insufficient documentation

## 2012-11-09 DIAGNOSIS — R079 Chest pain, unspecified: Principal | ICD-10-CM | POA: Insufficient documentation

## 2012-11-09 DIAGNOSIS — E785 Hyperlipidemia, unspecified: Secondary | ICD-10-CM | POA: Insufficient documentation

## 2012-11-09 DIAGNOSIS — R0789 Other chest pain: Secondary | ICD-10-CM

## 2012-11-09 DIAGNOSIS — L03319 Cellulitis of trunk, unspecified: Secondary | ICD-10-CM | POA: Insufficient documentation

## 2012-11-09 DIAGNOSIS — L03116 Cellulitis of left lower limb: Secondary | ICD-10-CM

## 2012-11-09 DIAGNOSIS — IMO0001 Reserved for inherently not codable concepts without codable children: Secondary | ICD-10-CM | POA: Insufficient documentation

## 2012-11-09 DIAGNOSIS — J4489 Other specified chronic obstructive pulmonary disease: Secondary | ICD-10-CM | POA: Insufficient documentation

## 2012-11-09 DIAGNOSIS — E875 Hyperkalemia: Secondary | ICD-10-CM

## 2012-11-09 DIAGNOSIS — F79 Unspecified intellectual disabilities: Secondary | ICD-10-CM | POA: Insufficient documentation

## 2012-11-09 HISTORY — DX: Cerebral infarction, unspecified: I63.9

## 2012-11-09 LAB — CBC
HCT: 33.4 % — ABNORMAL LOW (ref 39.0–52.0)
Hemoglobin: 11.4 g/dL — ABNORMAL LOW (ref 13.0–17.0)
Hemoglobin: 10.7 g/dL — ABNORMAL LOW (ref 13.0–17.0)
MCH: 24.2 pg — ABNORMAL LOW (ref 26.0–34.0)
MCHC: 32.6 g/dL (ref 30.0–36.0)
MCHC: 32 g/dL (ref 30.0–36.0)
Platelets: 272 10*3/uL (ref 150–400)
RDW: 15.2 % (ref 11.5–15.5)
WBC: 13.3 10*3/uL — ABNORMAL HIGH (ref 4.0–10.5)

## 2012-11-09 LAB — CREATININE, SERUM
Creatinine, Ser: 1.14 mg/dL (ref 0.50–1.35)
GFR calc Af Amer: 80 mL/min — ABNORMAL LOW (ref >90)
GFR calc non Af Amer: 69 mL/min — ABNORMAL LOW (ref >90)

## 2012-11-09 LAB — COMPREHENSIVE METABOLIC PANEL
ALT: 13 U/L (ref 0–53)
AST: 17 U/L (ref 0–37)
Albumin: 3.4 g/dL — ABNORMAL LOW (ref 3.5–5.2)
Alkaline Phosphatase: 90 U/L (ref 39–117)
Calcium: 9.1 mg/dL (ref 8.4–10.5)
GFR calc Af Amer: 87 mL/min — ABNORMAL LOW (ref >90)
Glucose, Bld: 276 mg/dL — ABNORMAL HIGH (ref 70–99)
Potassium: 5 mEq/L (ref 3.5–5.1)
Sodium: 133 mEq/L — ABNORMAL LOW (ref 135–145)
Total Protein: 7.8 g/dL (ref 6.0–8.3)

## 2012-11-09 LAB — BASIC METABOLIC PANEL
Calcium: 9 mg/dL (ref 8.4–10.5)
Potassium: 4.9 mEq/L (ref 3.5–5.3)
Sodium: 135 mEq/L (ref 135–145)

## 2012-11-09 LAB — POCT I-STAT TROPONIN I: Troponin i, poc: 0 ng/mL (ref 0.00–0.08)

## 2012-11-09 LAB — POCT GLYCOSYLATED HEMOGLOBIN (HGB A1C): Hemoglobin A1C: 10.9

## 2012-11-09 LAB — PRO B NATRIURETIC PEPTIDE: Pro B Natriuretic peptide (BNP): 449.4 pg/mL — ABNORMAL HIGH (ref 0–125)

## 2012-11-09 MED ORDER — ASPIRIN EC 81 MG PO TBEC
81.0000 mg | DELAYED_RELEASE_TABLET | Freq: Every day | ORAL | Status: DC
  Administered 2012-11-10 – 2012-11-11 (×2): 81 mg via ORAL
  Filled 2012-11-09 (×3): qty 1

## 2012-11-09 MED ORDER — GABAPENTIN 300 MG PO CAPS
300.0000 mg | ORAL_CAPSULE | Freq: Two times a day (BID) | ORAL | Status: DC
  Administered 2012-11-10 – 2012-11-11 (×4): 300 mg via ORAL
  Filled 2012-11-09 (×6): qty 1

## 2012-11-09 MED ORDER — HYDROCORTISONE 1 % EX OINT
TOPICAL_OINTMENT | Freq: Three times a day (TID) | CUTANEOUS | Status: DC
  Administered 2012-11-10 – 2012-11-11 (×4): via TOPICAL
  Filled 2012-11-09: qty 28.35

## 2012-11-09 MED ORDER — ACETAMINOPHEN 325 MG PO TABS
650.0000 mg | ORAL_TABLET | ORAL | Status: DC | PRN
  Administered 2012-11-10 – 2012-11-11 (×3): 650 mg via ORAL
  Filled 2012-11-09 (×3): qty 2

## 2012-11-09 MED ORDER — ONDANSETRON HCL 4 MG/2ML IJ SOLN: 4.0000 mg | Freq: Four times a day (QID) | INTRAMUSCULAR | Status: DC | PRN

## 2012-11-09 MED ORDER — HEPARIN SODIUM (PORCINE) 5000 UNIT/ML IJ SOLN
5000.0000 [IU] | Freq: Three times a day (TID) | INTRAMUSCULAR | Status: DC
  Administered 2012-11-09: 5000 [IU] via SUBCUTANEOUS
  Filled 2012-11-09 (×2): qty 1

## 2012-11-09 MED ORDER — INSULIN GLARGINE 100 UNIT/ML ~~LOC~~ SOLN
20.0000 [IU] | Freq: Every day | SUBCUTANEOUS | Status: DC
  Administered 2012-11-10 – 2012-11-11 (×2): 20 [IU] via SUBCUTANEOUS
  Filled 2012-11-09 (×2): qty 0.2

## 2012-11-09 MED ORDER — METOPROLOL TARTRATE 25 MG PO TABS
25.0000 mg | ORAL_TABLET | Freq: Two times a day (BID) | ORAL | Status: DC
  Administered 2012-11-10 – 2012-11-11 (×4): 25 mg via ORAL
  Filled 2012-11-09 (×7): qty 1

## 2012-11-09 MED ORDER — DABIGATRAN ETEXILATE MESYLATE 150 MG PO CAPS
150.0000 mg | ORAL_CAPSULE | Freq: Two times a day (BID) | ORAL | Status: DC
  Administered 2012-11-10 – 2012-11-11 (×4): 150 mg via ORAL
  Filled 2012-11-09 (×5): qty 1

## 2012-11-09 MED ORDER — DILTIAZEM HCL ER COATED BEADS 240 MG PO CP24
240.0000 mg | ORAL_CAPSULE | Freq: Every day | ORAL | Status: DC
  Administered 2012-11-10 – 2012-11-11 (×2): 240 mg via ORAL
  Filled 2012-11-09 (×2): qty 1

## 2012-11-09 MED ORDER — HEPARIN SODIUM (PORCINE) 5000 UNIT/ML IJ SOLN: 5000.0000 [IU] | Freq: Three times a day (TID) | INTRAMUSCULAR | Status: DC

## 2012-11-09 MED ORDER — NITROGLYCERIN 0.4 MG SL SUBL: 0.4000 mg | SUBLINGUAL_TABLET | SUBLINGUAL | Status: DC | PRN

## 2012-11-09 MED ORDER — CLONAZEPAM 0.25 MG PO TBDP: 0.2500 mg | ORAL_TABLET | Freq: Two times a day (BID) | ORAL | Status: DC

## 2012-11-09 MED ORDER — ACETAMINOPHEN 325 MG PO TABS
650.0000 mg | ORAL_TABLET | Freq: Once | ORAL | Status: AC
  Administered 2012-11-09: 650 mg via ORAL
  Filled 2012-11-09: qty 2

## 2012-11-09 MED ORDER — ASPIRIN 81 MG PO CHEW: 324.0000 mg | CHEWABLE_TABLET | Freq: Once | ORAL | Status: DC

## 2012-11-09 MED ORDER — FUROSEMIDE 40 MG PO TABS
40.0000 mg | ORAL_TABLET | Freq: Every day | ORAL | Status: DC
  Administered 2012-11-10 – 2012-11-11 (×2): 40 mg via ORAL
  Filled 2012-11-09 (×3): qty 1

## 2012-11-09 MED ORDER — INSULIN ASPART 100 UNIT/ML ~~LOC~~ SOLN
0.0000 [IU] | Freq: Three times a day (TID) | SUBCUTANEOUS | Status: DC
  Administered 2012-11-10: 8 [IU] via SUBCUTANEOUS
  Administered 2012-11-10 (×2): 5 [IU] via SUBCUTANEOUS
  Administered 2012-11-11: 11 [IU] via SUBCUTANEOUS
  Administered 2012-11-11: 5 [IU] via SUBCUTANEOUS

## 2012-11-09 MED ORDER — ATORVASTATIN CALCIUM 40 MG PO TABS
40.0000 mg | ORAL_TABLET | Freq: Every day | ORAL | Status: DC
  Administered 2012-11-10 – 2012-11-11 (×2): 40 mg via ORAL
  Filled 2012-11-09 (×3): qty 1

## 2012-11-09 MED ORDER — PAROXETINE HCL 20 MG PO TABS
40.0000 mg | ORAL_TABLET | Freq: Every day | ORAL | Status: DC
  Administered 2012-11-10 – 2012-11-11 (×2): 40 mg via ORAL
  Filled 2012-11-09 (×3): qty 2

## 2012-11-09 MED ORDER — DOXYCYCLINE HYCLATE 100 MG PO TABS
100.0000 mg | ORAL_TABLET | Freq: Two times a day (BID) | ORAL | Status: DC
  Administered 2012-11-10 – 2012-11-11 (×4): 100 mg via ORAL
  Filled 2012-11-09 (×7): qty 1

## 2012-11-09 NOTE — ED Notes (Signed)
Pt back from radiology and placed back on monitor.

## 2012-11-09 NOTE — ED Notes (Signed)
Attempted to call report to floor.  Was told rn would return my call 

## 2012-11-09 NOTE — Progress Notes (Signed)
Name: [name] Age/Sex: 57 y.o. male  HPI:  Chest pain: Patient comes to clinic today for routine follow up, however he is having 10/10 chest pain currently. It began this morning at 1:30am and has persisted. It is in the middle of his chest, and feels as if someone hit him. He is feeling a little short of breath and says it took 1.5 hours to get his shoes on this morning because the pain. No radiation to his neck but his left arm is hurting and he feels weak and numb in his fingers. He feels both hot and cold (warm in his head, but his body has cold chills). He was nauseated today but not as much right now. He took his normal medicine this morning, including his Lantus 30 units (takes 30 BID). He is unsure if he took his aspirin this morning. It hurts him to inhale deeply and he says he feels like he's been "hit with a big truck". Yesterday he felt fine. His sister (whom he lives with) and her boyfriend both had a head cold recently. Denies diarrhea. Thought he might have been getting a fever. He complains of chronic weakness in his legs but says they are not worse today than normal. When asked why pt did not go to the ER, he says "so I wouldn't miss this appointment."  ROS: See HPI  PHYSICAL EXAM: BP 148/67  Pulse 77  Temp(Src) 99.1 F (37.3 C) (Oral)  Ht 5' (1.524 m)  Wt 236 lb (107.049 kg)  BMI 46.09 kg/m2 Gen: appears uncomfortable HEENT: poor dentition. TMs clear bilaterally. PERRL. Heart: RRR Abd: large ventral hernia present, abdomen otherwise nontender to palpation, +BS Lungs: crackles present in bilateral bases, R>L. Mildly increased WOB Neuro: speech intact (difficult to understand at baseline). PERRL.   Assessment/Plan: 57 y.o. male with cardiac risk factors including hypertension, uncontrolled T2DM (a1c 10.9 today), hyperlipidemia who presents with acute chest pain and shortness of breath. -EKG obtained in clinic and shows RBBB (seen previously), isolated ST depression  in v2, no other leads - not concerning for STEMI -given nitroglycerin x1 and aspirin 325 in clinic -pulse ox 97% on room air, placed on 3L O2 for ACS -had initially wanted to admit patient directly to stepdown unit at [facility], but he seems to be getting worse (worsening chest pain and overall appearance, but vitals still stable) so will call EMS and have him taken directly to the ER to hasten workup -pt seen and examined by Dr. Mauricio Po who agrees with this plan -discussed with Dr. Benjamin Stain who is on call for our family medicine teaching service, she is aware of patient

## 2012-11-09 NOTE — ED Notes (Signed)
Pt stated he was having a bad headache following the nitro given by EMS.  MD made aware and verbal order for tylenol 650 mg given

## 2012-11-09 NOTE — ED Provider Notes (Signed)
History     CSN: [id number]  Arrival date & time 11/09/12  1637   First MD Initiated Contact with Patient 11/09/12 1638      Chief Complaint  Patient presents with  . Chest Pain    (Consider location/radiation/quality/duration/timing/severity/associated sxs/prior treatment) HPI Patient presents with chest pain.  Initially presented to his primary care physician's office, but was referred here for further evaluation. He states that he awoke, approximately 15 hours ago with sternal chest pressure.  There is mild associated dyspnea.  The pressures largely nonradiating, present throughout the anterior chest. No syncope, no confusion, no disorientation. The patient has multiple other medical problems, including chronic skin lesions of the left lower extremity, known ventral hernia with peritoneal abscesses.  He denies notable changes in these 2 conditions specifically.  He states that his leg is better, and his abdomen also appears better. He denies new abdominal pain, no vomiting, diarrhea, new fever, no chills.  Past Medical History  Diagnosis Date  . Hypertension   . Diabetes mellitus   . Mental retardation     Sister helps to take care of him and takes him to appts  . Tobacco user     Smokes 1ppd for multiple years.  Quit after hosp 09/2010.  Marland Kitchen Anemia     History of Iron Def Anemia  . Anxiety   . Hyperlipidemia   . Atrial fibrillation 06/2009    CHADS score 2 (HTN, DM), was not on coumadin, but now on Pradaxa for Afib  . Lung nodule   . Chronic low back pain   . CVA (cerebral infarction) 09/2010    Bilateral with Left > Right  . Abdominal hernia     Chronic, not a good surgical candidate  . Carotid artery occlusion   . Obesity   . COPD (chronic obstructive pulmonary disease)   . Abscess, abdomen 12/31/2010    Referred to Wound Care in 01/2011 because of multiple abd abscess with VERY large ventral hernia (please look at image of CT abd/pelvis 09/2010).  Because of hernia I was  hesitant to I&D.        Past Surgical History  Procedure Laterality Date  . Carotid arteriogram  10/2010    30% right ICA stenosis, 40% left ICA stenosis   . Transesophageal echocardiogram  09/2010    No ASD or PFO. EF 60-65%.  Normal systolic function. No evidence of thrombus.   . Transthoracic echocardiogram  09/2010     The cavity size was normal. Systolic function was vigorous.  EF 65-70%.  Normal wall funciton.     Family History  Problem Relation Age of Onset  . Heart disease Mother   . Hypertension Sister   . Heart disease Sister   . Heart disease Brother   . Heart disease Father   . Peripheral vascular disease Father   . Heart disease Maternal Grandmother   . Heart attack Maternal Grandmother     History  Substance Use Topics  . Smoking status: Former Smoker -- 1.00 packs/day for 42 years    Types: Cigarettes  . Smokeless tobacco: Former Neurosurgeon    Quit date: 10/02/2010  . Alcohol Use: No      Review of Systems  Constitutional:       Per HPI, otherwise negative  HENT:       Per HPI, otherwise negative  Respiratory:       Per HPI, otherwise negative  Cardiovascular:       Per HPI, otherwise  negative  Gastrointestinal:       History of present illness  Endocrine:       Negative aside from HPI  Genitourinary:       Neg aside from HPI   Musculoskeletal:       Per HPI, otherwise negative  Skin: Negative.   Neurological: Negative for syncope.    Allergies  Penicillins  Home Medications   Current Outpatient Rx  Name  Route  Sig  Dispense  Refill  . aspirin 81 MG EC tablet   Oral   Take 81 mg by mouth daily.          Sharia Reeve LANCETS MISC      Check blood sugar before meals and at bedtime   100 each   3     For assure premier meter   . atorvastatin (LIPITOR) 40 MG tablet   Oral   Take 40 mg by mouth daily.         . clonazePAM (KLONOPIN) 0.25 MG disintegrating tablet   Oral   Take 0.25 mg by mouth 2 (two) times daily.          .  dabigatran (PRADAXA) 150 MG CAPS   Oral   Take 1 capsule (150 mg total) by mouth every 12 (twelve) hours.   60 capsule   2   . diltiazem (CARDIZEM CD) 240 MG 24 hr capsule   Oral   Take 240 mg by mouth daily.         . furosemide (LASIX) 40 MG tablet   Oral   Take 40 mg by mouth daily.         Marland Kitchen gabapentin (NEURONTIN) 300 MG capsule      TAKE 1 CAPSULE TWICE A DAY   60 capsule   2   . glucose blood test strip      Check blood sugar before meals and at bedtime   100 each   12     For assure premier meter   . hydrocortisone 2.5 % ointment   Topical   Apply topically 3 (three) times daily. To leg around wound. Do not apply to broken skin.   30 g   0   . INS SYRINGE/NEEDLE 1CC/28G (B-D INS SYR MICROFINE 1CC/28G) 28G X 1/2" 1 ML MISC      (indulin syringe-needle U-100) Dispense QS for 1 month supply wit daily dosing. dispense 1 box.          . insulin glargine (LANTUS SOLOSTAR) 100 UNIT/ML injection   Subcutaneous   Inject 30 Units into the skin 2 (two) times daily.   3 mL   2   . Insulin Pen Needle (B-D UF III MINI PEN NEEDLES) 31G X 5 MM MISC      Use as directed   100 each   6   . metFORMIN (GLUCOPHAGE) 1000 MG tablet   Oral   Take 1,000 mg by mouth 2 (two) times daily with a meal.         . metoprolol tartrate (LOPRESSOR) 25 MG tablet   Oral   Take 1 tablet (25 mg total) by mouth 2 (two) times daily.   60 tablet   2   . nitroGLYCERIN (NITROSTAT) 0.4 MG SL tablet   Sublingual   Place 0.4 mg under the tongue every 5 (five) minutes as needed for chest pain. If still have pain after 2nd pill, GO TO ER.         Marland Kitchen omeprazole (  PRILOSEC) 20 MG capsule   Oral   Take 20 mg by mouth daily.         . ondansetron (ZOFRAN) 4 MG tablet   Oral   Take 1 tablet (4 mg total) by mouth every 8 (eight) hours as needed for nausea.   10 tablet   0   . PARoxetine (PAXIL) 40 MG tablet   Oral   Take 1 tablet (40 mg total) by mouth daily.   30 tablet   1      There were no vitals taken for this visit.  Physical Exam  Nursing note and vitals reviewed. Constitutional: He is oriented to person, place, and time. He appears well-developed and well-nourished. No distress.  Pt in good spirits, in NAD  HENT:  Head: Normocephalic and atraumatic.  Mouth/Throat: Mucous membranes are dry. Oropharyngeal exudate present.  Eyes: EOM are normal. Pupils are equal, round, and reactive to light.  Neck: Normal range of motion. Neck supple.  No meningeal signs  Cardiovascular: Normal rate, regular rhythm and intact distal pulses.   Pulmonary/Chest: Effort normal. No respiratory distress. He has no wheezes. He has rales.  Mildly coarse lung sounds  Abdominal: Soft. Bowel sounds are normal. He exhibits distension. There is no tenderness. There is no rebound and no guarding.  right sided distention with 2 healing ulcers.  Musculoskeletal: Normal range of motion. He exhibits no edema and no tenderness.  5/5 strength throughout. Ambulates well  Neurological: He is alert and oriented to person, place, and time. No cranial nerve deficit.  No focal deficits. Sensation to light touch intact.   Skin: Skin is warm and dry. He is not diaphoretic. No erythema.  Healing skin ulcers noted to right side of abdomen and left pre-tibial area- patient notes improved condition.   Psychiatric:  Hx of MR    ED Course  Procedures (including critical care time)  Labs Reviewed  PRO B NATRIURETIC PEPTIDE  CBC  COMPREHENSIVE METABOLIC PANEL  PROTIME-INR   No results found.   No diagnosis found.  O2- 99%ra, normal  Cardiac: 90 sr, normal   Date: 11/09/2012  Rate: 89  Rhythm: normal sinus rhythm  QRS Axis: right  Intervals: normal  ST/T Wave abnormalities: nonspecific ST/T changes  Conduction Disutrbances:right bundle branch block and left anterior fascicular block  Narrative Interpretation:   Old EKG Reviewed: unchanged I reviewed his outpatient ECG, and a  prior - all similar. ABNORMAL  I reviewed the patient's chart, including today's outpatient visit.   MDM  This patient presents from his clinic where he initially presented to do new onset chest pain.  On my exam the patient is in no distress, though he continues to describe mouth pain.  The pain has improved since onset with nitroglycerin. Labs are notable for leukocytosis, and the patient has mild fever.  However, the patient states that his cutaneous wounds, his absence is all here better, and he has no new pain or superficial changes in these areas.  The patient also has mildly elevated BNP.  Given this constitutional mild abnormalities, patient was admitted for further evaluation and management. Absent elevated troponin, with a nonischemic ECG, given the passage of significant time since the onset of symptoms, there is less suspicion for ACS.         Gerhard Munch, MD 11/09/12 [id number]

## 2012-11-09 NOTE — ED Notes (Signed)
Per EMS - pt coming from family medicine across the street. Pt woke up around 0130 am this today with CP and went to PCP, was given 1 Nitro and 324 ASA at family practice. Pt denies n/v/sob. EMS started an 20G IV in left hand. RBBB HR 85, BP 143/60, CBG 276. Pt stable throughout transfer.

## 2012-11-09 NOTE — H&P (Signed)
Family Medicine Teaching [facility] Admission History and Physical Service Pager: [phone]  Patient name: [name] Medical record number: [id number] Date of birth: 28-Oct-1955 Age: 57 y.o. Gender: male  Primary Care Provider: Levert Feinstein, MD  Chief Complaint: Chest pain  History of Present Illness:  [name] is a 57 y.o. year old male with a complicated past medical history including, including uncontrolled diabetes and MR, presented to his PCP for a regular scheduled appointment and complained of 10/10 chest pain that began around 1:30am.  He states he woke to use the bathroom and noticed the pain. He reported no radiation to his neck but his left arm was hurting and he felt weak, with numbness to his fingers. On admission he reports he is still feeling a 9/10 right sided chest pain that has no radiation. He complains of a fever and chills. The pain was slightly relieved by nitro, however it never ceased. He endorses feeling short of breath today, especially with exertion. He does not require oxygen supplementation at home, but has required oxygen in the ED.  He has had a stroke in the past, but no MI per report. He also complains of a cough, right sided ear pain and nausea. Denies vomit, diarrhea or constipation. Last BM was yesterday. He did not receive a flu shot this year. He has some sick contacts at home with similar symptoms. He has a family history of heart disease with his mother and father, who are deceased.   Of note, patient has several skin lesions on abdomen and lower extremities that are chronic. He says they are itchy and he picks at them.  He denies any pain or tenderness.  Patient is not taking any antibiotics for this, but he told the ED physician that the lesions look better than before.  He does endorse nauseas an is febrile.  Denies vomiting.  Patient Active Problem List  Diagnosis  . DM (diabetes mellitus), type 2, uncontrolled  .  HYPERCHOLESTEROLEMIA  . ANXIETY  . TOBACCO DEPENDENCE  . MENTAL RETARDATION  . HYPERTENSION  . Atrial fibrillation  . COPD  . CVA (cerebral vascular accident)  . Incidental lung nodule, greater than or equal to 8mm  . Ventral hernia  . Sciatica  . Carotid artery stenosis, asymptomatic  . Leg weakness, bilateral  . Diabetic leg ulcer  . Routine health maintenance  . Hyperkalemia  . Chest discomfort  . Heart murmur, systolic  . Chest tightness   Past Medical History: Past Medical History  Diagnosis Date  . Hypertension   . Diabetes mellitus   . Mental retardation     Sister helps to take care of him and takes him to appts  . Tobacco user     Smokes 1ppd for multiple years.  Quit after hosp 09/2010.  Marland Kitchen Anemia     History of Iron Def Anemia  . Anxiety   . Hyperlipidemia   . Atrial fibrillation 06/2009    CHADS score 2 (HTN, DM), was not on coumadin, but now on Pradaxa for Afib  . Lung nodule   . Chronic low back pain   . CVA (cerebral infarction) 09/2010    Bilateral with Left > Right  . Abdominal hernia     Chronic, not a good surgical candidate  . Carotid artery occlusion   . Obesity   . COPD (chronic obstructive pulmonary disease)   . Abscess, abdomen 12/31/2010    Referred to Wound Care in 01/2011 because of multiple  abd abscess with VERY large ventral hernia (please look at image of CT abd/pelvis 09/2010).  Because of hernia I was hesitant to I&D.      Marland Kitchen Stroke    Past Surgical History: Past Surgical History  Procedure Laterality Date  . Carotid arteriogram  10/2010    30% right ICA stenosis, 40% left ICA stenosis   . Transesophageal echocardiogram  09/2010    No ASD or PFO. EF 60-65%.  Normal systolic function. No evidence of thrombus.   . Transthoracic echocardiogram  09/2010     The cavity size was normal. Systolic function was vigorous.  EF 65-70%.  Normal wall funciton.    Social History: History  Substance Use Topics  . Smoking status: Former Smoker -- 1.00  packs/day for 42 years    Types: Cigarettes    Quit date: 11/10/2010  . Smokeless tobacco: Former Neurosurgeon    Quit date: 10/02/2010  . Alcohol Use: No   For any additional social history documentation, please refer to relevant sections of EMR.  Family History: Family History  Problem Relation Age of Onset  . Heart disease Mother   . Hypertension Sister   . Heart disease Sister   . Heart disease Brother   . Heart disease Father   . Peripheral vascular disease Father   . Heart disease Maternal Grandmother   . Heart attack Maternal Grandmother    Allergies: Allergies  Allergen Reactions  . Penicillins Hives and Nausea And Vomiting   No current facility-administered medications on file prior to encounter.   Current Outpatient Prescriptions on File Prior to Encounter  Medication Sig Dispense Refill  . aspirin 81 MG EC tablet Take 81 mg by mouth daily.       Marland Kitchen atorvastatin (LIPITOR) 40 MG tablet Take 40 mg by mouth daily.      . clonazePAM (KLONOPIN) 0.25 MG disintegrating tablet Take 0.25 mg by mouth 2 (two) times daily.       . dabigatran (PRADAXA) 150 MG CAPS Take 1 capsule (150 mg total) by mouth every 12 (twelve) hours.  60 capsule  2  . diltiazem (CARDIZEM CD) 240 MG 24 hr capsule Take 240 mg by mouth daily.      . furosemide (LASIX) 40 MG tablet Take 40 mg by mouth daily.      Marland Kitchen gabapentin (NEURONTIN) 300 MG capsule TAKE 1 CAPSULE TWICE A DAY  60 capsule  2  . hydrocortisone 2.5 % ointment Apply topically 3 (three) times daily. To leg around wound. Do not apply to broken skin.  30 g  0  . insulin glargine (LANTUS SOLOSTAR) 100 UNIT/ML injection Inject 30 Units into the skin 2 (two) times daily.  3 mL  2  . metFORMIN (GLUCOPHAGE) 1000 MG tablet Take 1,000 mg by mouth 2 (two) times daily with a meal.      . metoprolol tartrate (LOPRESSOR) 25 MG tablet Take 1 tablet (25 mg total) by mouth 2 (two) times daily.  60 tablet  2  . nitroGLYCERIN (NITROSTAT) 0.4 MG SL tablet Place 0.4 mg  under the tongue every 5 (five) minutes as needed for chest pain. If still have pain after 2nd pill, GO TO ER.      Marland Kitchen omeprazole (PRILOSEC) 20 MG capsule Take 20 mg by mouth daily.      . ondansetron (ZOFRAN) 4 MG tablet Take 1 tablet (4 mg total) by mouth every 8 (eight) hours as needed for nausea.  10 tablet  0  .  PARoxetine (PAXIL) 40 MG tablet Take 1 tablet (40 mg total) by mouth daily.  30 tablet  1   Review Of Systems: Per HPI  Otherwise 12 point review of systems was performed and was unremarkable.  Physical Exam: BP 108/45  Pulse 84  Temp(Src) 102.8 F (39.3 C) (Oral)  Resp 29  Ht 5' (1.524 m)  Wt 231 lb 11.3 oz (105.1 kg)  BMI 45.25 kg/m2  SpO2 92% Exam: General: Awake lying in bed. Talkative, comfortable and cooperative.  HEENT: Normocephalic, atraumatic. Bilateral TM visualized and normal. PERLA. EOMi. Bilateral eyes without icterus, injections, drainage or conjunctivitis. Poor dentition. Throat with no erythema, possible small exudates. Membranes tacky.  Nares patent with no notable drainage. Neck is supple without LAD. Cardiovascular: RRR.  Respiratory: Increased WOB with use of accessory muscles and pauses every few words. No wheezing present, crackles appreciated in bilateral bases.  Abdomen: Obese, with profound ventral hernia present. 2 Lg Band-Aids over abdomen where he has scratched himself (from picking). Surgical scars present from previous attempts at hernia correction. Large area of erythema right of midline, over hernia that is warm to touch and mildly tender. BS present.  Extremities: Bilateral extremities with trace of edema, erythema around two dime size areas of healing ulcerations on left shin. Left leg notably warmer that right leg. Pulses equal bilaterally.  Skin: warm, dry with rash as mentioned above.  Neuro: Alert. Oriented, grossly intact, no focal deficits.  Labs and Imaging:  CBC    Component Value Date/Time   WBC 13.3* 11/09/2012 2145   RBC 4.49  11/09/2012 2145   HGB 10.7* 11/09/2012 2145   HCT 33.4* 11/09/2012 2145   PLT 267 11/09/2012 2145   MCV 74.4* 11/09/2012 2145   MCH 23.8* 11/09/2012 2145   MCHC 32.0 11/09/2012 2145   RDW 15.4 11/09/2012 2145   LYMPHSABS 1.5 10/16/2012 0810   MONOABS 0.5 10/16/2012 0810   EOSABS 0.3 10/16/2012 0810   BASOSABS 0.0 10/16/2012 0810   CMP     Component Value Date/Time   NA 133* 11/09/2012 1731   K 5.0 11/09/2012 1731   CL 96 11/09/2012 1731   CO2 26 11/09/2012 1731   GLUCOSE 276* 11/09/2012 1731   BUN 23 11/09/2012 1731   CREATININE 1.14 11/09/2012 2145   CREATININE 1.13 11/09/2012 1439   CALCIUM 9.1 11/09/2012 1731   PROT 7.8 11/09/2012 1731   ALBUMIN 3.4* 11/09/2012 1731   AST 17 11/09/2012 1731   ALT 13 11/09/2012 1731   ALKPHOS 90 11/09/2012 1731   BILITOT 0.2* 11/09/2012 1731   GFRNONAA 69* 11/09/2012 2145   GFRAA 80* 11/09/2012 2145    Recent Labs  11/09/12 1756  TROPIPOC 0.00    Recent Labs  11/09/12 1731  PROBNP 449.4*   Dg Chest 2 View  11/09/2012  *RADIOLOGY REPORT*  Clinical Data:  Chest pain.  CHEST - 2 VIEW  Comparison: 10/16/2012  Findings: The heart size and mediastinal contours are within normal limits.  Both lungs are clear.  The visualized skeletal structures are unremarkable.  IMPRESSION: No active disease.   Original Report Authenticated By: Irish Lack, M.D.     Assessment and Plan: KAHRON KAUTH is a 57 y.o. male with uncontrolled diabetes, MR, HTN and hyperlipidemia that was admitted for ACS rule out and risk stratification work up. Tmax of 102.8 during admission. CXR resulted with no active disease. He currently lives in a motel with his sister and does not have resources available to him for assistance.  Will consult PT/OT and social work to aide in his evaluation and give recommendations in possible placement.  Chest pain: Considering length of time, focal with no radiation and negative trop x2, pain is not likely cardiac in nature. CXR ruled out acute process or infection. Most likely  musculoskeletal in nature, could be a result of his increased WOB and possible viral/bacterial illness. With a Tmax 102.8, cannot completely rule out infectious process. - Trop POCT: negative, repeat Trop I pending. - EKG: SR. RBBB AND LPFB, repeat in morning  - Pain: Tylenol, has not complained of pain since admission - CXR normal - O2 PRN, with continuous monitoring - Lipid panel, Mag, TSH pending.  Fever: Tmax of 102.8, with BP lower than his average at time of fever onset - Tylenol for pain or fever - Doxycycline ordered for probable skin infections. Continued hydrocortisone cream.  - Blood culture ordered x2 --> Will consider abx coverage if fever continues or patient's clinical picture worsens.  Diabetes: - SSI- moderate --> may need to adjust to resistant. However patient reports he has no eaten well over the past few days - Lantus 20 u HS - Continued Gabapentin  - A1c 10.9 on admission   Cardiac:  - A.Fib: Continued Cardizem, ASA - HTN: Continued Clonazepam, furosemide, metoprolol tartrate - Hyperlipidemia: Continued atorvastatin  FEN/GI:  - Carb modified - NS @ 150 mL/h - Zofran PRN - Paroxetine continued  Prophylaxis: Hep SQ Disposition: Admitted to inpatient service for observation, rule out ACS with PT/OT and social service recommendations on placement.  Code Status: Full   Felix Pacini, DO 11/10/2012, 1:29 AM  Patient seen and examined with Dr. Claiborne Billings.  Available data reviewed. Agree with findings, assessment, and plan as outlined per Dr. Claiborne Billings.  My additional findings are documented and highlighted above.  [employer], DO 11/10/2012 7:03 AM

## 2012-11-10 ENCOUNTER — Other Ambulatory Visit: Payer: Self-pay

## 2012-11-10 DIAGNOSIS — E1169 Type 2 diabetes mellitus with other specified complication: Secondary | ICD-10-CM

## 2012-11-10 DIAGNOSIS — L97909 Non-pressure chronic ulcer of unspecified part of unspecified lower leg with unspecified severity: Secondary | ICD-10-CM

## 2012-11-10 DIAGNOSIS — I6350 Cerebral infarction due to unspecified occlusion or stenosis of unspecified cerebral artery: Secondary | ICD-10-CM

## 2012-11-10 DIAGNOSIS — I4891 Unspecified atrial fibrillation: Secondary | ICD-10-CM

## 2012-11-10 DIAGNOSIS — L03319 Cellulitis of trunk, unspecified: Secondary | ICD-10-CM

## 2012-11-10 DIAGNOSIS — R0789 Other chest pain: Secondary | ICD-10-CM

## 2012-11-10 LAB — CBC
HCT: 30.5 % — ABNORMAL LOW (ref 39.0–52.0)
Hemoglobin: 9.9 g/dL — ABNORMAL LOW (ref 13.0–17.0)
MCH: 24.2 pg — ABNORMAL LOW (ref 26.0–34.0)
MCHC: 32.5 g/dL (ref 30.0–36.0)

## 2012-11-10 LAB — MAGNESIUM: Magnesium: 2 mg/dL (ref 1.5–2.5)

## 2012-11-10 LAB — LIPID PANEL
Cholesterol: 124 mg/dL (ref 0–200)
Total CHOL/HDL Ratio: 5.2 RATIO

## 2012-11-10 LAB — BASIC METABOLIC PANEL
BUN: 36 mg/dL — ABNORMAL HIGH (ref 6–23)
Calcium: 8.4 mg/dL (ref 8.4–10.5)
GFR calc non Af Amer: 60 mL/min — ABNORMAL LOW (ref >90)
Glucose, Bld: 237 mg/dL — ABNORMAL HIGH (ref 70–99)

## 2012-11-10 MED ORDER — CLONAZEPAM 0.5 MG PO TABS
0.2500 mg | ORAL_TABLET | Freq: Two times a day (BID) | ORAL | Status: DC
  Administered 2012-11-10 – 2012-11-11 (×4): 0.25 mg via ORAL
  Filled 2012-11-10 (×5): qty 1

## 2012-11-10 MED ORDER — SODIUM CHLORIDE 0.9 % IV SOLN
INTRAVENOUS | Status: DC
  Administered 2012-11-10 (×2): via INTRAVENOUS

## 2012-11-10 MED ORDER — GI COCKTAIL ~~LOC~~
30.0000 mL | Freq: Once | ORAL | Status: AC
  Administered 2012-11-10: 30 mL via ORAL
  Filled 2012-11-10: qty 30

## 2012-11-10 NOTE — Progress Notes (Signed)
SATURATION QUALIFICATIONS: (This note is used to comply with regulatory documentation for home oxygen)  Patient Saturations on Room Air at Rest = 95%  Patient Saturations on Room Air while Ambulating = 92%  Patient Saturations on 2 Liters of oxygen while Ambulating = 96%  Please briefly explain why patient needs home oxygen: Pt did not desat below 90% with ambulation on RA.  Pt at this time does not qualify for home O2.  Thanks. [facility] [phone] (pager)

## 2012-11-10 NOTE — Evaluation (Signed)
Occupational Therapy Evaluation and Discharge Patient Details Name: [name] MRN: [id number] DOB: 01/12/1956 Today's Date: 11/10/2012 Time: 4098-1191 OT Time Calculation (min): 23 min  OT Assessment / Plan / Recommendation Clinical Impression  This 57 yo male admitted with chest pain presents to acute OT at an indepenedent to mod I level. No further acute OT needs, will sign off.    OT Assessment  Patient does not need any further OT services    Follow Up Recommendations  No OT follow up       Equipment Recommendations  None recommended by OT          Precautions / Restrictions Precautions Precautions: None Restrictions Weight Bearing Restrictions: No   Pertinent Vitals/Pain sats on 1 liter at rest 92%, post activity on RA pt only as low as 90%    ADL  Transfers/Ambulation Related to ADLs: Independent sit to stand and stand to sit, Mod I with ambulation with IV pole (usually uses SPC) ADL Comments: Independent with all BADLs, pt stated post finishing brushing his teeth/mouth that that was the first time in 20 years he had brushed his teeth        Visit Information  Last OT Received On: 11/10/12 Assistance Needed: +1    Subjective Data  Subjective: I pretty much do everything for myself except my meds and driving   Prior Functioning     Home Living Lives With: Other (Comment) Available Help at Discharge: Family;Available PRN/intermittently Type of Home: House Home Access: Stairs to enter [employer] of Steps: 3 Entrance Stairs-Rails: None Home Layout: One level Bathroom Shower/Tub: Network engineer: None Prior Function Level of Independence: Needs assistance Needs Assistance:  (meds and driving) Able to Take Stairs?: Yes Driving: No Vocation: Retired Musician: No difficulties Dominant Hand: Right         Vision/Perception Vision - History Baseline Vision:  Wears glasses for distance only Patient Visual Report: No change from baseline   Cognition  Cognition Overall Cognitive Status: History of cognitive impairments - at baseline Arousal/Alertness: Awake/alert Orientation Level: Appears intact for tasks assessed Behavior During Session: [facility] for tasks performed    Extremity/Trunk Assessment Right Upper Extremity Assessment RUE ROM/Strength/Tone: Within functional levels Left Upper Extremity Assessment LUE ROM/Strength/Tone: Within functional levels     Mobility Bed Mobility Bed Mobility: Supine to Sit;Sitting - Scoot to Edge of Bed Supine to Sit: 6: Modified independent (Device/Increase time);HOB elevated Sitting - Scoot to Edge of Bed: 7: Independent Transfers Transfers: Sit to Stand;Stand to Sit Sit to Stand: 6: Modified independent (Device/Increase time);With upper extremity assist;From bed Stand to Sit: 6: Modified independent (Device/Increase time);With upper extremity assist;With armrests;To chair/3-in-1           End of Session OT - End of Session Activity Tolerance: Patient tolerated treatment well Patient left: in chair;with call bell/phone within reach  GO Functional Assessment Tool Used: Clinical observation Functional Limitation: Self care Self Care Current Status (Y7829): 0 percent impaired, limited or restricted Self Care Goal Status (F6213): 0 percent impaired, limited or restricted Self Care Discharge Status [phone]): 0 percent impaired, limited or restricted   Evette Georges [phone] 11/10/2012, 4:09 PM

## 2012-11-10 NOTE — Progress Notes (Signed)
Family Medicine Teaching Service Daily Progress Note Service Page: [phone]  Subjective: Patient complains of dull chest pain that has improved, but not completely.  He did spike a fever 102.8 at midnight.  Blood cultures were drawn at that time.  He denies any abdominal pain, nausea, vomiting, or SOB.  Objective: Temp:  [99.1 F (37.3 C)-102.8 F (39.3 C)] 100 F (37.8 C) (04/05 0557) Pulse Rate:  [70-98] 70 (04/05 0557) Resp:  [19-29] 22 (04/05 0557) BP: (108-160)/(45-78) 118/57 mmHg (04/05 0557) SpO2:  [92 %-97 %] 97 % (04/05 0557) FiO2 (%):  [24 %] 24 % (04/04 2245) Weight:  [231 lb 11.3 oz (105.1 kg)-236 lb (107.049 kg)] 234 lb 2.1 oz (106.2 kg) (04/05 0557)  Exam: Gen: sitting in recliner, in no acute distress Heart: RRR, no murmur Abd: large ventral hernia present, abdomen otherwise nontender to palpation, +BS  Lungs: crackles present in bilateral bases, R>L. Normal effort. Skin: erythematous warm lesions on abdomen and LT anterior shin, seem improved from previous exam; non-tender on palpation  I have reviewed the patient's medications, labs, imaging, and diagnostic testing.  Notable results are summarized below.  CBC BMET   Recent Labs Lab 11/09/12 1731 11/09/12 2145  WBC 13.8* 13.3*  HGB 11.4* 10.7*  HCT 35.0* 33.4*  PLT 272 267    Recent Labs Lab 11/09/12 1439 11/09/12 1731 11/09/12 2145  NA 135 133*  --   K 4.9 5.0  --   CL 98 96  --   CO2 27 26  --   BUN 22 23  --   CREATININE 1.13 1.07 1.14  GLUCOSE 308* 276*  --   CALCIUM 9.0 9.1  --      Imaging/Diagnostic Tests: CHEST - 2 VIEW  Comparison: 10/16/2012  Findings: The heart size and mediastinal contours are within normal  limits. Both lungs are clear. The visualized skeletal structures  are unremarkable.   IMPRESSION:  No active disease.  Plan: NORA ROOKE is a 57 y.o. male with uncontrolled diabetes, MR, HTN and hyperlipidemia that was admitted for ACS rule out. Tmax of 102.8 during  admission. Also found to have cellulitis of abdomen and lower extremities.  Chest pain: Considering length of time, focal with no radiation and negative trop x2, pain is not likely cardiac in nature. CXR ruled out acute process or infection. Most likely musculoskeletal in nature, could be a result of his increased WOB and possible viral/bacterial illness. With a Tmax 102.8, cannot completely rule out infectious process. EKG: SR. RBBB AND LPFB, same as previous. - For Pain: Tylenol PRN - Follow up risk stratification - Will give GI cocktail to see if this helps  Fever: Tmax of 102.8, with BP lower than his average at time of fever onset.  Again CXR normal.  Fever could be secondary to skin infections. - Doxycycline ordered for probable skin infections - Blood culture ordered x2 --> Will consider abx coverage if fever continues or patient's clinical picture worsens.   Diabetes:  - SSI- moderate --> may need to adjust to resistant. However patient reports he has no eaten well over the past few days  - Lantus 20 u HS  - Continued Gabapentin  - A1c 10.9 on admission   Cardiac:  - A.Fib: Continued Cardizem, ASA  - HTN: Continued Clonazepam, furosemide, metoprolol tartrate  - Hyperlipidemia: Continued atorvastatin   Anxiety: - Paroxetine continued   FEN/GI:  - Carb modified  - NS @ 150 mL/h  - Zofran PRN  Disposition: Admitted to inpatient service for observation, rule out ACS.  He currently lives in a motel with his sister and does not have resources available to him for assistance. Will consult PT/OT and social work to aide in his evaluation and give recommendations in possible placement.   Prophylaxis: Hep SQ  Code Status: Full  DE LA CRUZ,Daton Szilagyi, DO 11/10/2012, 8:33 AM

## 2012-11-10 NOTE — Progress Notes (Signed)
Utilization Review Completed.   Tristan Proto, RN, BSN Nurse Case Manager  [phone]  

## 2012-11-10 NOTE — Progress Notes (Signed)
I examined this patient and discussed the care plan with Dr Tye Savoy and the FPTS team and agree with assessment and plan as documented in the progress note above. He seemed minimally ill at the time of my exam and very interested in eating his lunch.

## 2012-11-10 NOTE — H&P (Signed)
I examined this patient and discussed the care plan with Dr Shona Simpson and the [facility] team and agree with assessment and plan as documented in the progress note above.

## 2012-11-10 NOTE — Progress Notes (Signed)
Pt. Spiked temperature of 102.8  PRN tylenol given as ordered.  MD on call made aware.  Will continue to monitor patient.

## 2012-11-10 NOTE — Evaluation (Signed)
Physical Therapy Evaluation Patient Details Name: [name] MRN: [id number] DOB: [birth year] Today's Date: 11/10/2012 Time: 8295-6213 PT Time Calculation (min): 25 min  PT Assessment / Plan / Recommendation Clinical Impression  Pt s/p chest pain with decr mobility secondary to decr endurance and decr balance.  Feel pt is most likely close to his baseline.  REcommend RW and HHPT f/u (unsure if pt can receive this at [facility]).  Will continue to see pt in hospital to incr his strength and endurance.      PT Assessment  Patient needs continued PT services    Follow Up Recommendations  Home health PT                Equipment Recommendations  Rolling walker with 5" wheels         Frequency Min 3X/week    Precautions / Restrictions Precautions Precautions: None Restrictions Weight Bearing Restrictions: No   Pertinent Vitals/Pain VSS, No pain      Mobility  Bed Mobility Bed Mobility: Supine to Sit;Sitting - Scoot to Edge of Bed Supine to Sit: 6: Modified independent (Device/Increase time);HOB elevated Sitting - Scoot to Edge of Bed: 7: Independent Transfers Transfers: Sit to Stand;Stand to Sit Sit to Stand: 6: Modified independent (Device/Increase time);With upper extremity assist;From bed Stand to Sit: 6: Modified independent (Device/Increase time);With upper extremity assist;With armrests;To chair/3-in-1 Ambulation/Gait Ambulation/Gait Assistance: 5: Supervision Ambulation Distance (Feet): 150 Feet Assistive device: None Ambulation/Gait Assistance Details: Ambulated without device without significant LOB although staggering gait at times.  O2 sats with 2 LO2 at 96%, O2 without O2 with ambulation 92%.  Pt DOE 2-3/4 with ambulation.  Pt fatigues quickly.   Gait Pattern: Step-to pattern;Decreased stride length;Decreased step length - right;Decreased step length - left;Shuffle;Wide base of support Gait velocity: decreased Stairs: No Wheelchair Mobility Wheelchair  Mobility: No         PT Diagnosis: Generalized weakness  PT Problem List: Decreased balance;Decreased mobility;Decreased activity tolerance;Decreased safety awareness;Decreased knowledge of use of DME;Decreased knowledge of precautions;Obesity PT Treatment Interventions: DME instruction;Gait training;Functional mobility training;Therapeutic activities;Therapeutic exercise;Balance training;Patient/family education;Stair training   PT Goals Acute Rehab PT Goals PT Goal Formulation: With patient Time For Goal Achievement: 11/17/12 Potential to Achieve Goals: Good Pt will go Supine/Side to Sit: Independently PT Goal: Supine/Side to Sit - Progress: Goal set today Pt will go Sit to Stand: Independently PT Goal: Sit to Stand - Progress: Goal set today Pt will Ambulate: 51 - 150 feet;with modified independence;with least restrictive assistive device PT Goal: Ambulate - Progress: Goal set today Pt will Go Up / Down Stairs: 3-5 stairs;with supervision;with least restrictive assistive device PT Goal: Up/Down Stairs - Progress: Goal set today  Visit Information  Last PT Received On: 11/10/12 Assistance Needed: +1    Subjective Data  Subjective: "I want to go home if I can." Patient Stated Goal: to go home   Prior Functioning  Home Living Lives With: Other (Comment) (sister) Available Help at Discharge: Family;Available PRN/intermittently Type of Home: Other (Comment) (pt stated he lives in house with 3 steps/chart states motel) Home Access: Stairs to enter [employer] of Steps: 3 Entrance Stairs-Rails: None Home Layout: One level Bathroom Shower/Tub: Network engineer: None Prior Function Level of Independence: Needs assistance Needs Assistance:  (meds and driving) Able to Take Stairs?: Yes Driving: No Vocation: Retired Musician: No difficulties Dominant Hand: Right    Cognition  Cognition Overall  Cognitive Status: History of cognitive impairments - at  baseline Arousal/Alertness: Awake/alert Orientation Level: Appears intact for tasks assessed Behavior During Session: [facility] for tasks performed    Extremity/Trunk Assessment Right Upper Extremity Assessment RUE ROM/Strength/Tone: Within functional levels Left Upper Extremity Assessment LUE ROM/Strength/Tone: Within functional levels Right Lower Extremity Assessment RLE ROM/Strength/Tone: WFL for tasks assessed Left Lower Extremity Assessment LLE ROM/Strength/Tone: WFL for tasks assessed   Balance Static Standing Balance Static Standing - Balance Support: No upper extremity supported;During functional activity Static Standing - Level of Assistance: 5: Stand by assistance Static Standing - Comment/# of Minutes: No asssit needed for static stance High Level Balance High Level Balance Activites: Side stepping;Direction changes;Turns High Level Balance Comments: Needed no assist with the above challenges.  End of Session PT - End of Session Equipment Utilized During Treatment: Gait belt;Oxygen Activity Tolerance: Patient limited by fatigue Patient left: in bed;with call bell/phone within reach Nurse Communication: Mobility status  GP Functional Assessment Tool Used: Clinical judgment Functional Limitation: Mobility: Walking and moving around Mobility: Walking and Moving Around Current Status (Z6109): At least 20 percent but less than 40 percent impaired, limited or restricted Mobility: Walking and Moving Around Goal Status [phone]): At least 1 percent but less than 20 percent impaired, limited or restricted   INGOLD,Dalary Hollar 11/10/2012, 4:34 PM  [facility] [phone] (pager)

## 2012-11-11 ENCOUNTER — Other Ambulatory Visit: Payer: Self-pay | Admitting: Family Medicine

## 2012-11-11 DIAGNOSIS — L02419 Cutaneous abscess of limb, unspecified: Secondary | ICD-10-CM

## 2012-11-11 LAB — BASIC METABOLIC PANEL
BUN: 35 mg/dL — ABNORMAL HIGH (ref 6–23)
CO2: 22 mEq/L (ref 19–32)
Chloride: 104 mEq/L (ref 96–112)
Creatinine, Ser: 1.26 mg/dL (ref 0.50–1.35)

## 2012-11-11 LAB — GLUCOSE, CAPILLARY
Glucose-Capillary: 268 mg/dL — ABNORMAL HIGH (ref 70–99)
Glucose-Capillary: 221 mg/dL — ABNORMAL HIGH (ref 70–99)

## 2012-11-11 MED ORDER — DOXYCYCLINE HYCLATE 100 MG PO TABS: 100.0000 mg | ORAL_TABLET | Freq: Two times a day (BID) | ORAL | Status: DC

## 2012-11-11 MED ORDER — INSULIN GLARGINE 100 UNIT/ML ~~LOC~~ SOLN: 30.0000 [IU] | Freq: Every day | SUBCUTANEOUS | Status: DC

## 2012-11-11 MED ORDER — ASPIRIN 81 MG PO TBEC: 81.0000 mg | DELAYED_RELEASE_TABLET | Freq: Every day | ORAL | Status: DC

## 2012-11-11 NOTE — Progress Notes (Signed)
   CARE MANAGEMENT NOTE 11/11/2012  Patient:  BARKLEY, KRATOCHVIL   Account Number:  [id number]  Date Initiated:  11/11/2012  Documentation initiated by:  [facility]  Subjective/Objective Assessment:     Action/Plan:   Anticipated DC Date:  11/11/2012   Anticipated DC Plan:  HOME W HOME HEALTH SERVICES      DC Planning Services  CM consult      [facility] Choice  HOME HEALTH   Choice offered to / List presented to:  C-1 Patient   DME arranged  Levan Hurst      DME agency  [facility]     HH arranged  HH-2 PT      [facility] agency  [facility]   Status of service:  Completed, signed off Medicare Important Message given?   (If response is "NO", the following Medicare IM given date fields will be blank) Date Medicare IM given:   Date Additional Medicare IM given:    Discharge Disposition:  HOME W HOME HEALTH SERVICES  Per UR Regulation:    If discussed at Long Length of Stay Meetings, dates discussed:    Comments:  11/11/2012 1400 NCM spoke to pt and offered choice for [facility]. Pt agreeable to [facility] for HH. Pt requesting RW for home. Faxed referral to [facility] for [facility] PT. Isidoro Donning RN CCM Case Mgmt phone [phone]

## 2012-11-11 NOTE — Progress Notes (Signed)
SATURATION QUALIFICATIONS: (This note is used to comply with regulatory documentation for home oxygen)  Patient Saturations on Room Air at Rest =97%  Patient Saturations on Room Air while Ambulating = 95%  Patient Saturations on 1 Liters of oxygen while Ambulating = 98%  Please briefly explain why patient needs home oxygen:

## 2012-11-11 NOTE — Progress Notes (Signed)
FMTS Daily Intern Progress Note  Subjective:  Feeling better. Complaining of pain in right chest, better than previous.  Denies any nausea, vomiting or abdominal pain. Eating and drinking normally.   I have reviewed the patient's medications.  Objective Temp:  [98.2 F (36.8 C)-99.6 F (37.6 C)] 99.6 F (37.6 C) (04/06 0510) Pulse Rate:  [64-78] 74 (04/06 0510) Resp:  [22-24] 24 (04/06 0510) BP: (112-130)/(46-56) 120/55 mmHg (04/06 0510) SpO2:  [95 %-96 %] 95 % (04/06 0510) Weight:  [239 lb 6.7 oz (108.6 kg)] 239 lb 6.7 oz (108.6 kg) (04/06 0510)   Intake/Output Summary (Last 24 hours) at 11/11/12 0854 Last data filed at 11/11/12 7829  Gross per 24 hour  Intake   2255 ml  Output   1153 ml  Net   1102 ml    CBG (last 3)   Recent Labs  11/09/12 2132 11/10/12 0603 11/10/12 1100  GLUCAP 283* 221* 246*    General: laying in bed, in no acute distress CV: S1S2, rrr, no murmur Pulm: occasional expiratory wheezing with bibasilar crackles.  Abd: large ventral hernia, non tender, bowel sounds present Skin: erythematous and warm area on left shin, non tender.  Large erythematous and warm area ~15cm on ventral hernia with clean bandaids, without drainage.   Labs and Imaging  Recent Labs Lab 11/09/12 1731 11/09/12 2145 11/10/12 1138  WBC 13.8* 13.3* 9.2  HGB 11.4* 10.7* 9.9*  HCT 35.0* 33.4* 30.5*  PLT 272 267 228     Recent Labs Lab 11/09/12 1439 11/09/12 1731 11/09/12 2145 11/10/12 1138  NA 135 133*  --  132*  K 4.9 5.0  --  4.1  CL 98 96  --  100  CO2 27 26  --  23  BUN 22 23  --  36*  CREATININE 1.13 1.07 1.14 1.29  GLUCOSE 308* 276*  --  237*  CALCIUM 9.0 9.1  --  8.4   CXR: 11/09/12 Findings: The heart size and mediastinal contours are within normal  limits. Both lungs are clear. The visualized skeletal structures  are unremarkable.  IMPRESSION:  No active disease.   Assessment and Plan  [name] is a 57 y.o. male with uncontrolled  diabetes, MR, HTN and hyperlipidemia that was admitted for ACS rule out. Tmax of 102.8 during admission. Also found to have cellulitis of abdomen and lower extremities.   Chest pain: Considering length of time, focal with no radiation and negative trop x2, pain is not likely cardiac in nature. CXR ruled out acute process or infection. Most likely musculoskeletal in nature, could be a result of his increased WOB and possible viral/bacterial illness. With a Tmax 102.8, cannot completely rule out infectious process. EKG: SR. RBBB AND LPFB, same as previous.  - For Pain: Tylenol PRN  - risk stratification labs: TSH: normal, LDL: 78, total chol: 124 on atorvastatin 40  Fever: Tmax of 102.8, over 24 hrs ago. Again CXR normal. Fever could be secondary to skin infections.  - continue doxycycline - Blood culture ordered x2, pending  Diabetes:  - SSI- moderate, needing 23 u of novolog in last 24 hrs, with CBGs ranging between 221 and 278.  - patient eating better. Increase lantus from 20 u to 30 units qhs. Patient on lantus 30 u bid at home.  - Continued Gabapentin  - A1c 10.9 on admission  Cardiac:  - A.Fib: Continued Cardizem, ASA  - HTN: Continued Clonazepam, furosemide, metoprolol tartrate  - Hyperlipidemia: Continued atorvastatin  Anxiety:  -  Paroxetine continued  FEN/GI:  - Carb modified  -d/c fluids since patient eating - Zofran PRN  Disposition: Admitted to inpatient service for observation. He currently lives in a motel with his sister and does not have resources available to him for assistance. PT/OT recommend home health at this time.  Prophylaxis: Hep SQ  Code StatusJennette Banker Pager: [phone] 11/11/2012, 8:54 AM

## 2012-11-11 NOTE — Progress Notes (Signed)
I examined this patient and discussed the care plan with Dr Gwenlyn Saran and the [facility] team and agree with assessment and plan as documented in the progress note above. I agree that he is ready for discharge once it is determined that he doesn't need home O2. It appears that a URI contracted from his family members is the predominant cause of his symptoms.

## 2012-11-11 NOTE — Discharge Summary (Signed)
Physician Discharge Summary   Patient ID: [name] [id number] y.o. 09/23/1955  Admit date: 11/09/2012  Discharge date and time: 11/11/2012  3:05 PM   Admitting Physician: Tobin Chad, MD   Discharge Physician: Zachery Dauer  Admission Diagnoses:  1. Chest pain 2. Cellulitis 3. Diabetes type 2- uncontrolled 4. Atrial Fibrillation 5. Hypertension 6. Hyperlipidemia 7. Anxiety  Discharge Diagnoses:  Chest pain-resolved Cellulitis Diabetes Type 2-uncontrolled  Atrial Fibrillation Hypertension Hyperlipidemia Anxiety  Admission Condition: stable  Discharged Condition: good  Indication for Admission: chest pain and fever  Hospital Course:  57 yo male with uncontrolled diabetes, MR, HTN and hyperlipidemia who presented with right sided chest pain mildly relieved by nitro. He was also found to be febrile thought to be secondary to cellulitis of the left leg and abdomen. 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 Troponins were negative x3. Risk stratification labs showed normal TSH, LDL of 78 on atorvastatin. Chest pain improved to a dull right sided ache. Chest pain was thought to be musculoskeletal.   - Fever and cellulitis: with a Tmax of 102.8. CXR was normal. Fever was thought to be from cellulitis located on left leg and abdomen around area of ventral hernia. Blood cultures were drawn and were negative. Patient was started on doxycycline with resolution of fever and improvement of skin erythema and warmth. He was discharged on 7 day course of doxycycline.   - Diabetes type 2: A1C of 10.9 on admission. Was startd on moderate sliding scale and 20 units of lantus with CBG's in the 200 range. Patient was discharged on home lantus 20u bid.   - Atrial fibrillation: continued on cardizem and aspirin. Rate controled during hospitalization  - hypertension: blood pressure controled on home  metoprolol, lasix and cardizem.  - Hyperlipidemia: continued atorvastatin - Anxiety: controled on paxil and clonazepam.     Consults: None  Significant Diagnostic Studies:  CXR: 11/09/12 Findings: The heart size and mediastinal contours are within normal  limits. Both lungs are clear. The visualized skeletal structures  are unremarkable.  IMPRESSION:  No active disease.  CBC    Component Value Date/Time   WBC 9.2 11/10/2012 1138   RBC 4.09* 11/10/2012 1138   HGB 9.9* 11/10/2012 1138   HCT 30.5* 11/10/2012 1138   PLT 228 11/10/2012 1138   MCV 74.6* 11/10/2012 1138   MCH 24.2* 11/10/2012 1138   MCHC 32.5 11/10/2012 1138   RDW 15.4 11/10/2012 1138   LYMPHSABS 1.5 10/16/2012 0810   MONOABS 0.5 10/16/2012 0810   EOSABS 0.3 10/16/2012 0810   BASOSABS 0.0 10/16/2012 0810    BMET    Component Value Date/Time   NA 133* 11/11/2012 0930   K 4.3 11/11/2012 0930   CL 104 11/11/2012 0930   CO2 22 11/11/2012 0930   GLUCOSE 278* 11/11/2012 0930   BUN 35* 11/11/2012 0930   CREATININE 1.26 11/11/2012 0930   CREATININE 1.13 11/09/2012 1439   CALCIUM 8.0* 11/11/2012 0930   GFRNONAA 61* 11/11/2012 0930   GFRAA 71* 11/11/2012 0930    Treatments: doxycycline, IV fluids  Disposition: 01-Home or Self Care with home health PT  Patient Instructions:    Medication List    TAKE these medications       aspirin 81 MG EC tablet  Take 1 tablet (81 mg total) by mouth daily.     aspirin 81 MG EC tablet  Take 81 mg by mouth daily.  atorvastatin 40 MG tablet  Commonly known as:  LIPITOR  Take 40 mg by mouth daily.     clonazePAM 0.25 MG disintegrating tablet  Commonly known as:  KLONOPIN  Take 0.25 mg by mouth 2 (two) times daily.     dabigatran 150 MG Caps  Commonly known as:  PRADAXA  Take 1 capsule (150 mg total) by mouth every 12 (twelve) hours.     diltiazem 240 MG 24 hr capsule  Commonly known as:  CARDIZEM CD  Take 240 mg by mouth daily.     doxycycline 100 MG tablet  Commonly known as:  VIBRA-TABS  Take  1 tablet (100 mg total) by mouth every 12 (twelve) hours.     gabapentin 300 MG capsule  Commonly known as:  NEURONTIN  TAKE 1 CAPSULE TWICE A DAY     hydrocortisone 2.5 % ointment  Apply topically 3 (three) times daily. To leg around wound. Do not apply to broken skin.     insulin glargine 100 UNIT/ML injection  Commonly known as:  LANTUS SOLOSTAR  Inject 30 Units into the skin 2 (two) times daily.     metoprolol tartrate 25 MG tablet  Commonly known as:  LOPRESSOR  Take 1 tablet (25 mg total) by mouth 2 (two) times daily.     nitroGLYCERIN 0.4 MG SL tablet  Commonly known as:  NITROSTAT  Place 0.4 mg under the tongue every 5 (five) minutes as needed for chest pain. If still have pain after 2nd pill, GO TO ER.     omeprazole 20 MG capsule  Commonly known as:  PRILOSEC  Take 20 mg by mouth daily.     ondansetron 4 MG tablet  Commonly known as:  ZOFRAN  Take 1 tablet (4 mg total) by mouth every 8 (eight) hours as needed for nausea.     PARoxetine 40 MG tablet  Commonly known as:  PAXIL  Take 1 tablet (40 mg total) by mouth daily.       Activity: activity as tolerated Diet: diabetic diet Wound Care: keep wound clean and dry  Follow-up Information   Follow up with Levert Feinstein, MD. Schedule an appointment as soon as possible for a visit in 3 days.   Contact information:   [address] [place] [address] [phone]       Follow up with [facility] . (Home Health Physical Therapy)    Contact information:   [phone]     Follow up Items: - cellulitis of left leg and abdomen - chest discomfort - blood sugars at home   Signed: Marena Chancy 11/12/2012 10:15 PM

## 2012-11-11 NOTE — Progress Notes (Signed)
Pt discharged by wheelchair by Tai, RN, discharged instructions given to patient and family, stated they have no questions at this time.

## 2012-11-12 ENCOUNTER — Other Ambulatory Visit: Payer: Self-pay | Admitting: Family Medicine

## 2012-11-12 MED ORDER — METFORMIN HCL 1000 MG PO TABS: 1000.0000 mg | ORAL_TABLET | Freq: Two times a day (BID) | ORAL | Status: DC

## 2012-11-12 MED ORDER — FUROSEMIDE 40 MG PO TABS: 40.0000 mg | ORAL_TABLET | Freq: Every day | ORAL | Status: DC

## 2012-11-13 NOTE — Discharge Summary (Signed)
I examined this patient and discussed the care plan with Dr Gwenlyn Saran and the [facility] team and agree with assessment and plan as documented in the discharge note above. On his follow up visit he should have a serum iron and TIBC done due to microcytic anemia and history of low iron in 2012. A ferritin is likely to be falsely elevated due to his cellulitis. Iron replacement may be beneficial after his skin infection has improved.

## 2012-11-16 LAB — CULTURE, BLOOD (ROUTINE X 2): Culture: NO GROWTH

## 2012-11-19 ENCOUNTER — Ambulatory Visit (INDEPENDENT_AMBULATORY_CARE_PROVIDER_SITE_OTHER): Payer: Medicaid Other | Admitting: Family Medicine

## 2012-11-19 ENCOUNTER — Encounter: Payer: Self-pay | Admitting: Family Medicine

## 2012-11-19 VITALS — BP 112/69 | HR 68 | Ht 60.0 in | Wt 238.0 lb

## 2012-11-19 DIAGNOSIS — E1165 Type 2 diabetes mellitus with hyperglycemia: Secondary | ICD-10-CM

## 2012-11-19 DIAGNOSIS — I10 Essential (primary) hypertension: Secondary | ICD-10-CM

## 2012-11-19 DIAGNOSIS — R0789 Other chest pain: Secondary | ICD-10-CM

## 2012-11-19 NOTE — Progress Notes (Signed)
S: Pt comes in today for HFU.  Pt was admitted from 4/4-11/11/12 for chest pain- deemed to be MSK.   Still having a little chest pain, only with coughing (has some allergies/cold).   No fevers, no SOB, no chest pain at rest unless coughing.  Feels like he is doing a little better, energy is a little better.  Also with 2 areas of cellulitis on L shin and on L abdomen over hernia- given 7 day course of doxycyline. Feels like both are looking better.  DM- d/c'ed on home lantus 20 U BID, home CBGs have been running N/A- not checked it since he got home.   HTN- d/c'ed on home medications of metoprolol, lasix, Cardizem.   ROS: Per HPI  History  Smoking status  . Former Smoker -- 1.00 packs/day for 42 years  . Types: Cigarettes  . Quit date: 11/10/2010  Smokeless tobacco  . Former Neurosurgeon  . Quit date: 10/02/2010    O:  Filed Vitals:   11/19/12 1425  BP: 112/69  Pulse: 68    Gen: NAD CV: very difficult to auscultate, but appears to be RRR, no murmur Pulm: occasional exp wheezes, no increased WOB Abd: obese, chronic skin changes, no open wounds or obvious cellulitis  Ext: Warm, no edema; chronic erythema over L shin, does not appear to be acutely infected; 2 small, <1cm scabbed over areas   A/P: 57 y.o. male p/w HFU -See problem list -f/u in 1 month with PCP

## 2012-11-19 NOTE — Assessment & Plan Note (Signed)
MSK in nature, cardiac w/u in hospital negative. Only with cough, continues to sound muscular. F/u with PCP.

## 2012-11-19 NOTE — Assessment & Plan Note (Signed)
Well controlled today, no changes in regimen.

## 2012-11-19 NOTE — Patient Instructions (Addendum)
It was nice to meet you today.  Keep taking your blood pressure and diabetes medicines.  Try to check your sugars at least once a day- in the morning before you eat anything.  Bring these numbers to your next appointment.   Make an appointment to see Dr. Pollie Meyer in the next few weeks to make sure you are still doing well.

## 2012-11-19 NOTE — Assessment & Plan Note (Addendum)
Encouraged pt to check CBGs at least 1 time at home.  Random today was 139. Cont lantus 20 BID  Lab Results  Component Value Date   HGBA1C 10.9 11/09/2012

## 2012-11-21 ENCOUNTER — Other Ambulatory Visit: Payer: Self-pay | Admitting: Family Medicine

## 2012-11-22 ENCOUNTER — Other Ambulatory Visit: Payer: Self-pay | Admitting: Family Medicine

## 2012-11-22 MED ORDER — ATORVASTATIN CALCIUM 40 MG PO TABS: 40.0000 mg | ORAL_TABLET | Freq: Every day | ORAL | Status: DC

## 2012-11-26 ENCOUNTER — Encounter: Payer: Self-pay | Admitting: *Deleted

## 2012-12-13 ENCOUNTER — Ambulatory Visit (INDEPENDENT_AMBULATORY_CARE_PROVIDER_SITE_OTHER): Payer: Medicaid Other | Admitting: Family Medicine

## 2012-12-13 ENCOUNTER — Encounter: Payer: Self-pay | Admitting: Family Medicine

## 2012-12-13 ENCOUNTER — Other Ambulatory Visit: Payer: Self-pay | Admitting: Family Medicine

## 2012-12-13 ENCOUNTER — Telehealth: Payer: Self-pay | Admitting: Family Medicine

## 2012-12-13 VITALS — BP 120/80 | HR 72 | Temp 98.7°F | Wt 234.4 lb

## 2012-12-13 DIAGNOSIS — IMO0001 Reserved for inherently not codable concepts without codable children: Secondary | ICD-10-CM

## 2012-12-13 DIAGNOSIS — S91301A Unspecified open wound, right foot, initial encounter: Secondary | ICD-10-CM

## 2012-12-13 DIAGNOSIS — S91309A Unspecified open wound, unspecified foot, initial encounter: Secondary | ICD-10-CM | POA: Insufficient documentation

## 2012-12-13 DIAGNOSIS — D509 Iron deficiency anemia, unspecified: Secondary | ICD-10-CM | POA: Insufficient documentation

## 2012-12-13 DIAGNOSIS — D649 Anemia, unspecified: Secondary | ICD-10-CM

## 2012-12-13 DIAGNOSIS — E1165 Type 2 diabetes mellitus with hyperglycemia: Secondary | ICD-10-CM

## 2012-12-13 DIAGNOSIS — I10 Essential (primary) hypertension: Secondary | ICD-10-CM

## 2012-12-13 DIAGNOSIS — E875 Hyperkalemia: Secondary | ICD-10-CM

## 2012-12-13 LAB — CBC
MCH: 23.2 pg — ABNORMAL LOW (ref 26.0–34.0)
Platelets: 295 10*3/uL (ref 150–400)
RBC: 4.4 MIL/uL (ref 4.22–5.81)
WBC: 9.3 10*3/uL (ref 4.0–10.5)

## 2012-12-13 LAB — FERRITIN: Ferritin: 20 ng/mL — ABNORMAL LOW (ref 22–322)

## 2012-12-13 LAB — C-REACTIVE PROTEIN: CRP: 1.4 mg/dL — ABNORMAL HIGH (ref <0.60)

## 2012-12-13 MED ORDER — ONETOUCH ULTRASOFT LANCETS MISC: Status: DC

## 2012-12-13 MED ORDER — GLUCOSE BLOOD VI STRP: ORAL_STRIP | Status: DC

## 2012-12-13 NOTE — Assessment & Plan Note (Signed)
Wound examined by myself and Dr. Mauricio Po. As it does not appear currently superinfected, will refer patient to wound care center for evaluation. Check foot plain film and CRP to rule out osteomyelitis.

## 2012-12-13 NOTE — Assessment & Plan Note (Signed)
Well controlled. Patient reports he stopped taking one of his BP medicines but is unable to tell me which one. He did not bring his meds with him today. As we are planning to refer to pharmacy clinic, hopefully we can elucidate more which medicines he is taking. Either way, it is reassuring that his BP is well controlled today.

## 2012-12-13 NOTE — Patient Instructions (Signed)
It was great to see you again today!  We are getting an x ray of your foot to make sure you don't have an infection in your bone. I'm also referring you to the [facility] so the foot can be seen.  I will call you if your test results are not normal.  Otherwise, I will send you a letter.  If you do not hear from me with in 2 weeks please call our office.     Come back to see me in 1-2 weeks.  Be well, Dr. Pollie Meyer

## 2012-12-13 NOTE — Assessment & Plan Note (Signed)
As was planned at hospital discharge, will check CBC, iron, TIBC, and ferritin today due to history of anemia.

## 2012-12-13 NOTE — Progress Notes (Signed)
Name: [name] Age/Sex: 57 y.o. male  HPI:  Diabetes: checks sugar sometimes but says he has problems with his lancets not fitting correctly. Doesn't check it every morning. This morning it was over 300. He is taking Lantus 30 units BID.  Blood pressure: well controlled today. He says he stopped taking one of his blood pressure medicines. He doesn't know which one it was. He was hospitalized last month after presenting to clinic with chest pain. He had to take nitroglycerin for chest pain shortly after his hospitalization but hasn't had to recently. Now just has chest pain that comes and goes. Not in pain currently.  Foot wound: has a cut on the plantar surface of his right foot. It happened sometime last week but he doesn't remember it happening. It is painful. Has been draining some. Also has a wound on his left arm.  ROS: See HPI  PMFSH:  Lives with his sister, Bonita Quin  PHYSICAL EXAM: BP 120/80  Pulse 72  Temp(Src) 98.7 F (37.1 C) (Oral)  Wt 234 lb 6.4 oz (106.323 kg)  BMI 45.78 kg/m2 Gen: NAD, pleasant Heart: heart sounds difficult to auscultate due to habitus Lungs: CTAB, NWOB Neuro: grossly nonfocal and at pt's baseline Abd: large ventral hernia still present. Cellulitis previously noted is healing well. Some bandages still present on pt's abdomen. Ext: ~3cm laceration on plantar aspect of pt's right forefoot. Some chronic healing changes around it. Serous drainage. No surrounding erythema. Tender to touch. Does not probe to bone. Also has area of superficial abrasion on extensor surface of left arm which is covered by a bandage. Area of previously noted cellulitis on left shin is well healed.

## 2012-12-13 NOTE — Assessment & Plan Note (Signed)
Pt has chronically uncontrolled type 2 diabetes which has been difficult to manage due to his intellectual disability. He reports that he will be set up soon with the PACE program, which I think will be very helpful for him. Not due for a1c yet as he just had one last month (10.8). I am not able to safely titrate his insulin as he does not reliably check his blood sugar, and I am more concerned about the possibility of bottoming out his blood sugars. After discussion with Dr. Mauricio Po, will plan to refer pt to pharmacy clinic with Dr. [name] for assistance with blood sugar education and insulin management.

## 2012-12-13 NOTE — Telephone Encounter (Signed)
Attempted x 2 to call patient. LVM asking for him to call us back.  When he calls back please tell him to avoid putting weight on his right foot where the cut is. He should be seen in the wound care center as soon as possible.

## 2012-12-14 ENCOUNTER — Telehealth: Payer: Self-pay | Admitting: Family Medicine

## 2012-12-14 ENCOUNTER — Ambulatory Visit (HOSPITAL_COMMUNITY)
Admission: RE | Admit: 2012-12-14 | Discharge: 2012-12-14 | Disposition: A | Discharge status: Home / Self Care | Payer: No Typology Code available for payment source | Source: Ambulatory Visit | Attending: Family Medicine | Admitting: Family Medicine

## 2012-12-14 DIAGNOSIS — S91301A Unspecified open wound, right foot, initial encounter: Secondary | ICD-10-CM

## 2012-12-14 DIAGNOSIS — M2010 Hallux valgus (acquired), unspecified foot: Secondary | ICD-10-CM | POA: Insufficient documentation

## 2012-12-14 DIAGNOSIS — X58XXXA Exposure to other specified factors, initial encounter: Secondary | ICD-10-CM | POA: Insufficient documentation

## 2012-12-14 DIAGNOSIS — E1159 Type 2 diabetes mellitus with other circulatory complications: Secondary | ICD-10-CM | POA: Insufficient documentation

## 2012-12-14 DIAGNOSIS — S91309A Unspecified open wound, unspecified foot, initial encounter: Secondary | ICD-10-CM | POA: Insufficient documentation

## 2012-12-14 DIAGNOSIS — M948X9 Other specified disorders of cartilage, unspecified sites: Secondary | ICD-10-CM | POA: Insufficient documentation

## 2012-12-14 DIAGNOSIS — I7090 Unspecified atherosclerosis: Secondary | ICD-10-CM | POA: Insufficient documentation

## 2012-12-14 DIAGNOSIS — M7730 Calcaneal spur, unspecified foot: Secondary | ICD-10-CM | POA: Insufficient documentation

## 2012-12-14 LAB — IRON AND TIBC
%SAT: 8 % — ABNORMAL LOW (ref 20–55)
TIBC: 324 ug/dL (ref 215–435)

## 2012-12-14 LAB — BASIC METABOLIC PANEL
Chloride: 99 mEq/L (ref 96–112)
Creat: 1.35 mg/dL (ref 0.50–1.35)
Potassium: 4.9 mEq/L (ref 3.5–5.3)
Sodium: 133 mEq/L — ABNORMAL LOW (ref 135–145)

## 2012-12-14 NOTE — Telephone Encounter (Signed)
Called and spoke with patient and his sister, who is his primary caretaker. Pt did go and get his x ray done this afternoon instead of going to the ER. The x-ray generally looks good except for one area in which osteomyelitis could not be definitively ruled out due to possible soft tissue overlap. Called pt to explain these results and to tell patient that he should stay off his foot as much as possible this weekend. If the wound begins to drain pus, gets more red, more tender, or he has any symptoms like fever or nausea he is to go to the ER immediately this weekend. Reminded his sister of the availability of the emergency line at the [facility] if they need to reach a provider this weekend. Stressed to them that if he has these symptoms he should go to the ER, and if he does not have a ride there that it is okay to call 911, it is just important that he be seen.   I also instructed patient's sister to call the [facility] first thing on Monday morning to schedule a same day appointment so that his foot can be examined again that morning. I am not sure where we are in the process of having him referred to the wound care center, as I ordered the referral yesterday afternoon and it does not appear that pt has an appointment there yet, as far as I can tell in Epic. Will message our referral coordinator as well to see if we can get that appointment set up.  I have discussed this plan with inpatient service cross cover upper level resident Dr. Louanne Belton, as it is after clinic hours and there were no attendings in the preceptor room when I called there. Will cc this note to attending Dr. Mauricio Po, who examined the pt's foot with me in clinic yesterday afternoon.

## 2012-12-14 NOTE — Telephone Encounter (Signed)
Called patient back. We did not start antibiotics yesterday because his wound did not appear to be acutely infected (no surrounding erythema) but it was open and weeping serous fluid. Had planned to get an xray, CRP, and refer him to the wound care center. He was not able to get his x ray yesterday because the person who gave him a ride to his clinic appointment did not have time to take him. He is unsure about getting in for an outpatient xray today. It would require a lot of coordination between pt (who is mentally handicapped) and other friends/family members, and the radiology departments. His CRP was elevated yesterday to 1.4.    After precepting with Dr. Mahala Menghini, the plan is now for pt to go to the ER today for his foot, so that he can receive necessary imaging or other workup. I am concerned that if he is not seen today in the ER he could be lost to follow up over the weekend, by which time his infection could be worse.  Patient told me he has a friend who can pick him up about noon today and take him to the emergency room, and he is planning to go when that friend arrives. Pt was instructed to stay off the foot as much as possible.

## 2012-12-21 ENCOUNTER — Telehealth: Payer: Self-pay | Admitting: Family Medicine

## 2012-12-21 ENCOUNTER — Other Ambulatory Visit: Payer: Self-pay | Admitting: Family Medicine

## 2012-12-21 NOTE — Telephone Encounter (Signed)
Called patient to check on him since he never followed up in clinic this week. Reports foot is still sore and swollen. No fevers or chills. Eating and drinking fine. Asked patient to come in on Monday and be seen. Scheduled appointment at 1:30pm in SDA overflow slot. I am in clinic on Monday afternoon but am fully booked. Will try to be available to see this pt.  Reminded pt and his sister that if the foot is getting worse, he has a fever, or any other symptoms he should go to the ER this weekend.  Also asked patient to bring all his medicines and his glucose meter with him to his next appointment with me so I can order the correct type of test strips (he says the kind I ordered is not correct).

## 2012-12-21 NOTE — Telephone Encounter (Deleted)
Called patient to check on him since he never followed up in clinic this week. Reports foot is still sore and swollen. No fevers or chills. Eating and drinking fine. Asked patient to come in on Monday and be seen. Scheduled appointment at 1:30pm in SDA overflow slot. I am in clinic on Monday afternoon but am fully booked. Will try to be available to see this pt.  Reminded pt and his sister that if the foot is getting worse, he has a fever, or any other symptoms he should go to the ER this weekend.  Also asked patient to bring all his medicines and his glucose meter with him to his next appointment with me so I can order the correct type of test strips (he says the kind I ordered is not correct). 

## 2012-12-24 ENCOUNTER — Ambulatory Visit (INDEPENDENT_AMBULATORY_CARE_PROVIDER_SITE_OTHER): Payer: Medicaid Other | Admitting: Family Medicine

## 2012-12-24 ENCOUNTER — Encounter: Payer: Self-pay | Admitting: Family Medicine

## 2012-12-24 VITALS — BP 140/54 | HR 71 | Temp 98.4°F | Wt 235.0 lb

## 2012-12-24 DIAGNOSIS — S91301D Unspecified open wound, right foot, subsequent encounter: Secondary | ICD-10-CM

## 2012-12-24 DIAGNOSIS — Z5189 Encounter for other specified aftercare: Secondary | ICD-10-CM

## 2012-12-24 MED ORDER — CIPROFLOXACIN HCL 500 MG PO TABS: 500.0000 mg | ORAL_TABLET | Freq: Two times a day (BID) | ORAL | Status: DC

## 2012-12-24 MED ORDER — METRONIDAZOLE 500 MG PO TABS: 500.0000 mg | ORAL_TABLET | Freq: Three times a day (TID) | ORAL | Status: DC

## 2012-12-24 NOTE — Progress Notes (Signed)
Name: [name] Age/Sex: 57 y.o. male  HPI:  Teri presents today to follow up on his foot wound. He says he thinks it is the same level of swelling as it has been the whole time. It is still painful. He is eating and drinking well and has not felt feverish. He now admits to walking outside without shoes on and says that might have been where he got the cut from.   ROS: See HPI - no fevers, good PO intake  PMFSH: hx of poorly controlled diabetes Does not smoke  PHYSICAL EXAM: BP 140/54  Pulse 71  Temp(Src) 98.4 F (36.9 C) (Oral)  Wt 235 lb (106.595 kg)  BMI 45.9 kg/m2 Gen: NAD. Pleasant and cooperative Lungs: normal respiratory effort Neuro: grossly nonfocal and at pt's baseline. Walks with limp from foot wound Derm: area of several dime sized superficial open sores on anterior upper left arm Ext: R foot with ~3cm wound on lateral plantar aspect of fore foot that is well granulated. Open but no drainage. It is tender to palpation. Right foot is mildly swollen but not erythematous, not more warm than left foot. Toes on left foot nontender to palpation. 2+ DP pulses bilaterally.

## 2012-12-24 NOTE — Patient Instructions (Signed)
It was great to see you again today!  I am sending in two prescriptions to your pharmacy to treat the infection in your foot and your arm. You'll take them for one week. Have a family member help you with the medicines.  Come back if your foot feels worse, you have a fever, feel nauseated, or have any other problems. Please keep your already scheduled appointment (see above).  Be well, Dr. Pollie Meyer

## 2012-12-25 ENCOUNTER — Encounter (HOSPITAL_BASED_OUTPATIENT_CLINIC_OR_DEPARTMENT_OTHER): Payer: No Typology Code available for payment source

## 2012-12-25 NOTE — Assessment & Plan Note (Signed)
Pt examined by myself and Dr. Leveda Anna. His wound appears well granulated and is without drainage. I am hopeful that it will heal well. Given the second area of possible cellulitis on his left arm, will go ahead and treat with a week's worth of PO antibiotics - will rx cipro and flagyl to cover for organisms expected in a diabeic foot wound. Applied a post-op shoe to wear to minimize pressure on that area of his foot while ambulating. Dr. Leveda Anna agrees with this plan.  Pt has f/u already scheduled on May 28, and I have instructed him to keep that appointment and bring with him all his medicines and his glucose meter, so that we can order him the correct kind of test strips and lancets.

## 2013-01-02 ENCOUNTER — Ambulatory Visit: Payer: No Typology Code available for payment source | Admitting: Family Medicine

## 2013-01-15 ENCOUNTER — Other Ambulatory Visit: Payer: Self-pay | Admitting: Family Medicine

## 2013-01-17 ENCOUNTER — Telehealth: Payer: Self-pay | Admitting: Family Medicine

## 2013-01-17 NOTE — Telephone Encounter (Signed)
[facility] red team, can you call this patient and let him or his sister Bonita Quin know that he is due for an appointment? He no-showed to his most recently scheduled appointment with me.  Can you also remind him or his sister that he needs to call [facility] back about scheduling his colonoscopy?  Thanks so much, [place]

## 2013-01-18 NOTE — Telephone Encounter (Signed)
Spoke with patient's sister, Bonita Quin.  Scheduled appt with PCP for 01/21/2013 at 3:30pm.  Instructed, Bonita Quin (sister) to call and reschedule colonoscopy appt.  Maris Bena, Darlyne Russian, CMA

## 2013-01-21 ENCOUNTER — Encounter: Payer: Self-pay | Admitting: Family Medicine

## 2013-01-21 ENCOUNTER — Ambulatory Visit (INDEPENDENT_AMBULATORY_CARE_PROVIDER_SITE_OTHER): Payer: Medicaid Other | Admitting: Family Medicine

## 2013-01-21 VITALS — BP 122/70 | HR 68 | Temp 98.6°F | Ht 60.0 in | Wt 230.2 lb

## 2013-01-21 DIAGNOSIS — I10 Essential (primary) hypertension: Secondary | ICD-10-CM

## 2013-01-21 DIAGNOSIS — D649 Anemia, unspecified: Secondary | ICD-10-CM

## 2013-01-21 DIAGNOSIS — S91301D Unspecified open wound, right foot, subsequent encounter: Secondary | ICD-10-CM

## 2013-01-21 DIAGNOSIS — E1165 Type 2 diabetes mellitus with hyperglycemia: Secondary | ICD-10-CM

## 2013-01-21 DIAGNOSIS — Z5189 Encounter for other specified aftercare: Secondary | ICD-10-CM

## 2013-01-21 NOTE — Progress Notes (Signed)
Name: [name] Age/Sex: 57 y.o. male  HPI:  Foot wound: His foot continues to hurt, especially with ambulation. He denies fevers and says he is eating and drinking all right but has somewhat of a decreased appetite. He did take the antibiotics I gave him at his last visit.  Diabetes: He did not bring his medicines or glucometer with him. Unable to perform med rec because he does not remember the names of his medicines. Says his last eye exam was when he last saw the doctor - unclear about this hx. Denies chest pain. Says he does sometimes get short of breath if he exerts himself.  PACE program is supposed to start seeing him sometime this month. He isn't sure if he wants to do it thought if it means changing doctors as he prefers to continue coming here to the [facility].  ROS: See HPI  PMFSH: hx uncontrolled DM, strokes, and MR  PHYSICAL EXAM: BP 122/70  Pulse 68  Temp(Src) 98.6 F (37 C) (Oral)  Ht 5' (1.524 m)  Wt 230 lb 3.2 oz (104.418 kg)  BMI 44.96 kg/m2 Gen: NAD Heart: RRR. No murmurs noted. Lungs: CTAB, NWOB Neuro: grossly nonfocal, at patient's baseline Ext: 2+ DP pulses bilaterally. R foot with increased swelling compared to left. Wound on plantar aspect of foot appears well granulated on edges but is open and goes deeper than previously noted, with red raw tissue in the bottom of wound. Did not attempt to try to probe to bone. Calves have no swelling and are nontender to palpation bilaterally.  ASSESSMENT/PLAN:  # Health maintenance:  -recently had nursing staff call pt's sister to ask her to schedule appt with GI to discuss colonoscopy as he is due for one and has iron deficiency anemia

## 2013-01-21 NOTE — Patient Instructions (Addendum)
I am worried about the wound on your foot. I am concerned you may have an infection in your bone. I am referring you to an orthopedic surgeon to be evaluated. Please keep this appointment. You will be called to schedule it.  If you start having fevers, nausea/vomiting, or worsening redness in your foot or leg, go to the ER right away. I also want you to see the pharmacists here. You can schedule that appointment on your way out.  Call the clinic with any questions.  Dr. Pollie Meyer

## 2013-01-22 ENCOUNTER — Telehealth: Payer: Self-pay | Admitting: Family Medicine

## 2013-01-22 ENCOUNTER — Emergency Department (HOSPITAL_COMMUNITY): Payer: No Typology Code available for payment source

## 2013-01-22 ENCOUNTER — Emergency Department (HOSPITAL_COMMUNITY)
Admission: EM | Admit: 2013-01-22 | Discharge: 2013-01-22 | Disposition: A | Discharge status: Home / Self Care | Payer: No Typology Code available for payment source | Attending: Emergency Medicine | Admitting: Emergency Medicine

## 2013-01-22 ENCOUNTER — Encounter (HOSPITAL_COMMUNITY): Payer: Self-pay | Admitting: Physical Medicine and Rehabilitation

## 2013-01-22 DIAGNOSIS — Z794 Long term (current) use of insulin: Secondary | ICD-10-CM | POA: Insufficient documentation

## 2013-01-22 DIAGNOSIS — Z79899 Other long term (current) drug therapy: Secondary | ICD-10-CM | POA: Insufficient documentation

## 2013-01-22 DIAGNOSIS — Z8679 Personal history of other diseases of the circulatory system: Secondary | ICD-10-CM | POA: Insufficient documentation

## 2013-01-22 DIAGNOSIS — L97509 Non-pressure chronic ulcer of other part of unspecified foot with unspecified severity: Secondary | ICD-10-CM | POA: Insufficient documentation

## 2013-01-22 DIAGNOSIS — Z862 Personal history of diseases of the blood and blood-forming organs and certain disorders involving the immune mechanism: Secondary | ICD-10-CM | POA: Insufficient documentation

## 2013-01-22 DIAGNOSIS — I10 Essential (primary) hypertension: Secondary | ICD-10-CM | POA: Insufficient documentation

## 2013-01-22 DIAGNOSIS — J4489 Other specified chronic obstructive pulmonary disease: Secondary | ICD-10-CM | POA: Insufficient documentation

## 2013-01-22 DIAGNOSIS — Z87891 Personal history of nicotine dependence: Secondary | ICD-10-CM | POA: Insufficient documentation

## 2013-01-22 DIAGNOSIS — E1169 Type 2 diabetes mellitus with other specified complication: Secondary | ICD-10-CM | POA: Insufficient documentation

## 2013-01-22 DIAGNOSIS — Z8673 Personal history of transient ischemic attack (TIA), and cerebral infarction without residual deficits: Secondary | ICD-10-CM | POA: Insufficient documentation

## 2013-01-22 DIAGNOSIS — F411 Generalized anxiety disorder: Secondary | ICD-10-CM | POA: Insufficient documentation

## 2013-01-22 DIAGNOSIS — J449 Chronic obstructive pulmonary disease, unspecified: Secondary | ICD-10-CM | POA: Insufficient documentation

## 2013-01-22 DIAGNOSIS — G8929 Other chronic pain: Secondary | ICD-10-CM | POA: Insufficient documentation

## 2013-01-22 DIAGNOSIS — I4891 Unspecified atrial fibrillation: Secondary | ICD-10-CM | POA: Insufficient documentation

## 2013-01-22 DIAGNOSIS — Z7982 Long term (current) use of aspirin: Secondary | ICD-10-CM | POA: Insufficient documentation

## 2013-01-22 DIAGNOSIS — E785 Hyperlipidemia, unspecified: Secondary | ICD-10-CM | POA: Insufficient documentation

## 2013-01-22 DIAGNOSIS — E669 Obesity, unspecified: Secondary | ICD-10-CM | POA: Insufficient documentation

## 2013-01-22 DIAGNOSIS — F79 Unspecified intellectual disabilities: Secondary | ICD-10-CM | POA: Insufficient documentation

## 2013-01-22 DIAGNOSIS — Z8709 Personal history of other diseases of the respiratory system: Secondary | ICD-10-CM | POA: Insufficient documentation

## 2013-01-22 DIAGNOSIS — Z880 Allergy status to penicillin: Secondary | ICD-10-CM | POA: Insufficient documentation

## 2013-01-22 DIAGNOSIS — Z8719 Personal history of other diseases of the digestive system: Secondary | ICD-10-CM | POA: Insufficient documentation

## 2013-01-22 LAB — CBC WITH DIFFERENTIAL/PLATELET
Eosinophils Absolute: 0.4 10*3/uL (ref 0.0–0.7)
Hemoglobin: 10.9 g/dL — ABNORMAL LOW (ref 13.0–17.0)
Lymphs Abs: 2.1 10*3/uL (ref 0.7–4.0)
MCH: 23.2 pg — ABNORMAL LOW (ref 26.0–34.0)
Monocytes Relative: 8 % (ref 3–12)
Neutro Abs: 3.8 10*3/uL (ref 1.7–7.7)
Neutrophils Relative %: 55 % (ref 43–77)
RBC: 4.7 MIL/uL (ref 4.22–5.81)

## 2013-01-22 LAB — POCT I-STAT, CHEM 8
Chloride: 102 mEq/L (ref 96–112)
Creatinine, Ser: 1.2 mg/dL (ref 0.50–1.35)
Glucose, Bld: 290 mg/dL — ABNORMAL HIGH (ref 70–99)
Potassium: 5 mEq/L (ref 3.5–5.1)

## 2013-01-22 MED ORDER — CIPROFLOXACIN HCL 500 MG PO TABS: 500.0000 mg | ORAL_TABLET | Freq: Two times a day (BID) | ORAL | Status: DC

## 2013-01-22 MED ORDER — HYDROCODONE-ACETAMINOPHEN 5-325 MG PO TABS
1.0000 | ORAL_TABLET | Freq: Once | ORAL | Status: AC
  Administered 2013-01-22: 1 via ORAL
  Filled 2013-01-22: qty 1

## 2013-01-22 MED ORDER — METRONIDAZOLE 500 MG PO TABS: 500.0000 mg | ORAL_TABLET | Freq: Two times a day (BID) | ORAL | Status: DC

## 2013-01-22 MED ORDER — GADOBENATE DIMEGLUMINE 529 MG/ML IV SOLN
15.0000 mL | Freq: Once | INTRAVENOUS | Status: AC
  Administered 2013-01-22: 15 mL via INTRAVENOUS

## 2013-01-22 NOTE — Progress Notes (Signed)
Orthopedic Tech Progress Note Patient Details:  [name] 12/12/1955 [id number]  Ortho Devices Type of Ortho Device: CAM walker Ortho Device/Splint Location: right foot Ortho Device/Splint Interventions: Application   Nikki Dom 01/22/2013, 7:31 PM

## 2013-01-22 NOTE — Telephone Encounter (Signed)
Received page from Terese Door that she attempted to process pt's ortho referral but Dr. Victorino Dike wants patient sent to ER instead. Called pt's cell phone # x2 and left VM asking him to call us back. Also tried calling his sister at home whom he lives with. When he calls back please ask him to go to the ER TODAY as soon as possible to get started on antibiotics for his foot.  I will try to keep calling. Will also call his niece to see if she can get in touch with him. She helps him with his medicine.  Levert Feinstein Pager [phone]

## 2013-01-22 NOTE — Telephone Encounter (Signed)
Called patient again and reached him. Advised him to go to the ER as soon as possible. He does not drive and will have to find a ride. Advised that he can call 911 if he cannot find a ride, that it is very important for him to go to the ER to start getting antibiotics and that he may need surgery on his foot.   He gave me permission to contact his sister, Bonita Quin via her cell phone [phone]). I called her number and left a message asking her to call us. [name] is intellectually disabled and lives with his sister, she helps take care of him and should ideally be updated regarding his foot and the need to go to the ER. If she calls back please inform her that I have asked [name] to go to the ER. If she has questions you can page me at [phone] and I will be happy to call her back and answer her questions.  Levert Feinstein, MD

## 2013-01-22 NOTE — ED Provider Notes (Signed)
History     CSN: [id number]  Arrival date & time 01/22/13  1145   First MD Initiated Contact with Patient 01/22/13 1204      Chief Complaint  Patient presents with  . Wound Check    (Consider location/radiation/quality/duration/timing/severity/associated sxs/prior treatment) HPI  Patient to the ED sent in by PCP at the request of Dr. Victorino Dike for evaluation of his right foot wound. He has had diabetic ulcer to right bottom of his foot for several months that has been progressively getting worse. He is also having significant pain on the top of his foot when he walks. His sister is his care giver as he has mild MR. He is a diabetic, no fevers, chills, n/v/d, feeling of weakness. NO redness, swelling or discharge coming from foot. PT is ambulatory. nad vss  Past Medical History  Diagnosis Date  . Hypertension   . Diabetes mellitus   . Mental retardation     Sister helps to take care of him and takes him to appts  . Tobacco user     Smokes 1ppd for multiple years.  Quit after hosp 09/2010.  Marland Kitchen Anemia     History of Iron Def Anemia  . Anxiety   . Hyperlipidemia   . Atrial fibrillation 06/2009    CHADS score 2 (HTN, DM), was not on coumadin, but now on Pradaxa for Afib  . Lung nodule   . Chronic low back pain   . CVA (cerebral infarction) 09/2010    Bilateral with Left > Right  . Abdominal hernia     Chronic, not a good surgical candidate  . Carotid artery occlusion   . Obesity   . COPD (chronic obstructive pulmonary disease)   . Abscess, abdomen 12/31/2010    Referred to Wound Care in 01/2011 because of multiple abd abscess with VERY large ventral hernia (please look at image of CT abd/pelvis 09/2010).  Because of hernia I was hesitant to I&D.      Marland Kitchen Stroke     Past Surgical History  Procedure Laterality Date  . Carotid arteriogram  10/2010    30% right ICA stenosis, 40% left ICA stenosis   . Transesophageal echocardiogram  09/2010    No ASD or PFO. EF 60-65%.  Normal systolic  function. No evidence of thrombus.   . Transthoracic echocardiogram  09/2010     The cavity size was normal. Systolic function was vigorous.  EF 65-70%.  Normal wall funciton.     Family History  Problem Relation Age of Onset  . Heart disease Mother   . Hypertension Sister   . Heart disease Sister   . Heart disease Brother   . Heart disease Father   . Peripheral vascular disease Father   . Heart disease Maternal Grandmother   . Heart attack Maternal Grandmother     History  Substance Use Topics  . Smoking status: Former Smoker -- 1.00 packs/day for 42 years    Types: Cigarettes    Quit date: 11/10/2010  . Smokeless tobacco: Former Neurosurgeon    Quit date: 10/02/2010  . Alcohol Use: No      Review of Systems  Skin: Positive for wound.  All other systems reviewed and are negative.    Allergies  Penicillins  Home Medications   Current Outpatient Rx  Name  Route  Sig  Dispense  Refill  . aspirin EC 81 MG EC tablet   Oral   Take 1 tablet (81 mg total) by mouth daily.  90 tablet   3   . atorvastatin (LIPITOR) 40 MG tablet   Oral   Take 40 mg by mouth daily.         . dabigatran (PRADAXA) 150 MG CAPS   Oral   Take 150 mg by mouth every 12 (twelve) hours.         Marland Kitchen diltiazem (CARDIZEM CD) 240 MG 24 hr capsule   Oral   Take 240 mg by mouth daily.         Marland Kitchen doxycycline (VIBRA-TABS) 100 MG tablet   Oral   Take 1 tablet (100 mg total) by mouth every 12 (twelve) hours.   14 tablet   0     Take for 7 days   . furosemide (LASIX) 40 MG tablet   Oral   Take 1 tablet (40 mg total) by mouth daily.   30 tablet   2   . gabapentin (NEURONTIN) 300 MG capsule   Oral   Take 300 mg by mouth 2 (two) times daily.         Marland Kitchen glucose blood test strip      Use as instructed   100 each   3   . hydrocortisone 2.5 % ointment   Topical   Apply topically 3 (three) times daily. To leg around wound. Do not apply to broken skin.   30 g   0   . insulin glargine  (LANTUS SOLOSTAR) 100 UNIT/ML injection   Subcutaneous   Inject 30 Units into the skin 2 (two) times daily.   3 mL   2   . Lancets (ONETOUCH ULTRASOFT) lancets      Use as instructed   100 each   3   . metFORMIN (GLUCOPHAGE) 1000 MG tablet   Oral   Take 1 tablet (1,000 mg total) by mouth 2 (two) times daily with a meal.   60 tablet   2   . metoprolol tartrate (LOPRESSOR) 25 MG tablet   Oral   Take 25 mg by mouth 2 (two) times daily.         . nitroGLYCERIN (NITROSTAT) 0.4 MG SL tablet   Sublingual   Place 0.4 mg under the tongue every 5 (five) minutes as needed for chest pain. If still have pain after 2nd pill, GO TO ER.         Marland Kitchen omeprazole (PRILOSEC) 20 MG capsule   Oral   Take 20 mg by mouth daily.         . ondansetron (ZOFRAN) 4 MG tablet   Oral   Take 1 tablet (4 mg total) by mouth every 8 (eight) hours as needed for nausea.   10 tablet   0   . PARoxetine (PAXIL) 40 MG tablet   Oral   Take 40 mg by mouth every morning.         . ciprofloxacin (CIPRO) 500 MG tablet   Oral   Take 1 tablet (500 mg total) by mouth 2 (two) times daily.   14 tablet   0   . metroNIDAZOLE (FLAGYL) 500 MG tablet   Oral   Take 1 tablet (500 mg total) by mouth 2 (two) times daily.   14 tablet   0     BP 131/104  Pulse 109  Temp(Src) 97.8 F (36.6 C) (Oral)  Resp 18  SpO2 98%  Physical Exam  Nursing note and vitals reviewed. Constitutional: He appears well-developed and well-nourished. No distress.  HENT:  Head: Normocephalic and  atraumatic.  Eyes: Pupils are equal, round, and reactive to light.  Neck: Normal range of motion. Neck supple.  Cardiovascular: Normal rate and regular rhythm.   Pulmonary/Chest: Effort normal.  Abdominal: Soft.  Musculoskeletal:       Feet:  Ext: 2+ DP pulses bilaterally.Calves have no swelling and are nontender to palpation bilaterally.  Neurological: He is alert.  Skin: Skin is warm and dry.    ED Course  Procedures  (including critical care time)  Labs Reviewed  CBC WITH DIFFERENTIAL - Abnormal; Notable for the following:    Hemoglobin 10.9 (*)    HCT 34.6 (*)    MCV 73.6 (*)    MCH 23.2 (*)    Eosinophils Relative 6 (*)    All other components within normal limits  POCT I-STAT, CHEM 8 - Abnormal; Notable for the following:    BUN 24 (*)    Glucose, Bld 290 (*)    Hemoglobin 11.6 (*)    HCT 34.0 (*)    All other components within normal limits   Dg Foot Complete Right  01/22/2013   *RADIOLOGY REPORT*  Clinical Data: Soft tissue wound  RIGHT FOOT COMPLETE - 3+ VIEW  Comparison:  Dec 14, 2012  Findings: Frontal, oblique, and lateral views were obtained. There is a subtle lucency in lateral aspect of the proximal first distal phalanx, felt to represent a subtle nondisplaced fracture.  No other evidence of fracture.  No dislocation.  Joint spaces appear intact.  No erosive change or bony destruction.  No soft tissue abscess.  There are spurs arising from the posterior inferior calcaneus.  There are multiple foci of vascular calcification, probably due to diabetes mellitus.  Conclusion: Several fracture along the lateral aspect of the proximal portion of the first distal phalanx. Extensive small vessel arterial calcification. Calcaneal spurs.  No bony destruction.  No soft tissue abscess seen.   Original Report Authenticated By: Bretta Bang, M.D.     1. Diabetic foot ulcer       MDM  Foot xray and cbc/chem 8. Discussed case with Dr. Clarene Duke, will call Dr. Victorino Dike for further management recommendations when labs/images have resulted. Medication given to patient for pain.   I spoke with patients PCP. Concern for osteomyelitis - our xray shows possible fractures but no white count. She says that he has poor follow-up capabilities. His sister does help him get to his appointments.   2:20pm - I spoke with Dr. Victorino Dike who was very unaware of the patient and the situation. He was happy to help.  Recommends MRI w/wo contrast of the right foot to evaluate for osteo, put him in a cam-walker boot and have him follow-up with his office this Friday or Monday.  End of shift care handed over to oncoming midlevel.  Dorthula Matas, PA-C 01/22/13 1545

## 2013-01-22 NOTE — ED Notes (Signed)
MRI called about delay in getting pt over and states that it would be about before they could come get him. Pt notified about the delay and given a [place] sandwich and sprite.

## 2013-01-22 NOTE — ED Notes (Signed)
Pt returned from xray

## 2013-01-22 NOTE — ED Notes (Signed)
Ortho tech responded, and en route.

## 2013-01-22 NOTE — ED Notes (Signed)
Patient transported to MRI 

## 2013-01-22 NOTE — ED Notes (Addendum)
Pt presents to department for evaluation of R foot wound. Pt is diabetic, states wound has been present for several months. 10/10 pain. No drainage noted from site. Pt is alert and oriented x4. Ambulatory to triage.

## 2013-01-22 NOTE — Telephone Encounter (Signed)
Called and spoke with Dr. Laverta Baltimore office. They have apparently been communicating with his clinic staff but have not spoken with Dr. Victorino Dike directly. The clinic staff recommended pt go to the ER and follow up with Dr. Victorino Dike as an outpatient. I questioned whether pt needs to go to ER just to get antibiotics and f/u as an outpatient, when I saw him in clinic just yesterday and could just call him in antibiotics. Dr. Laverta Baltimore office's recommendation is still for pt to go to ER.   Due to the social circumstances in Garlin's life (intellectual disability, his inability to drive, difficulty getting to appointments and following medical instructions) as well as his chronic diabetes which is very poorly controlled, I will still have him go to the ER to be evaluated.

## 2013-01-22 NOTE — ED Notes (Signed)
Pt presents to ED with wound to bottom of rt foot. Pt has diabetes and was sent over by PCP for wound check. Pt states the wound is painful, and is having difficulty walking on the foot.

## 2013-01-22 NOTE — ED Notes (Signed)
Pt returned from MRI. Ortho tech paged for cam walker.

## 2013-01-22 NOTE — ED Notes (Signed)
Deshondra Worst PA at bedside  

## 2013-01-22 NOTE — ED Notes (Signed)
Discharge instructions reviewed with pt. Pt verbalized understanding.   

## 2013-01-22 NOTE — Telephone Encounter (Signed)
Pt's sister Bonita Quin called me back. Advised her of my instructions to [place], that he be seen in the ER. She will call around and see if she can find someone to give him a ride to the ER. Reminded her that 911 is available as a last resort.

## 2013-01-22 NOTE — ED Notes (Signed)
Pt made aware that he is waiting for MRI results and then he is going to be discharged.

## 2013-01-23 NOTE — ED Provider Notes (Signed)
Medical screening examination/treatment/procedure(s) were performed by non-physician practitioner and as supervising physician I was immediately available for consultation/collaboration.   Daley Gosse M Tashauna Caisse, DO 01/23/13 0951 

## 2013-01-25 ENCOUNTER — Other Ambulatory Visit: Payer: Self-pay | Admitting: Family Medicine

## 2013-01-27 NOTE — Assessment & Plan Note (Signed)
Foot examined with Dr. Mauricio Po. Given the depth of the wound, we have a high concern for osteomyelitis in the foot. As patient has no fevers or signs of systemic illness will refer to ortho as an outpatient for evaluation and management of this wound. Dr. Mauricio Po agrees with this plan.

## 2013-01-27 NOTE — Assessment & Plan Note (Signed)
Had wanted to discuss adding iron to pt's medication regimen today, but much of our visit was devoted to talking about his foot wound. Will plan to discuss this at a future visit. He is also due for a colonoscopy. Reminded pt to have his sister get that appointment scheduled today.

## 2013-01-27 NOTE — Assessment & Plan Note (Signed)
I still would like to refer pt to pharmacy clinic for assistance with medication regimen. I think overall the [facility] program will be good for him as they can perform more intensive care, including in-home therapies if need be. Due for A1c next month. Will need to follow up with pt on the eye exam he says he got recently, and obtain records regarding this.

## 2013-01-27 NOTE — Assessment & Plan Note (Signed)
BP at goal today. Continue current management.

## 2013-02-13 ENCOUNTER — Encounter (HOSPITAL_COMMUNITY): Payer: Self-pay

## 2013-02-13 ENCOUNTER — Encounter (HOSPITAL_COMMUNITY): Payer: Self-pay | Admitting: Anesthesiology

## 2013-02-13 ENCOUNTER — Encounter (HOSPITAL_COMMUNITY)
Admission: EM | Disposition: A | Discharge status: Home / Self Care | Payer: Self-pay | Source: Home / Self Care | Attending: Family Medicine

## 2013-02-13 ENCOUNTER — Observation Stay (HOSPITAL_COMMUNITY): Payer: No Typology Code available for payment source | Admitting: Anesthesiology

## 2013-02-13 ENCOUNTER — Emergency Department (HOSPITAL_COMMUNITY): Payer: No Typology Code available for payment source

## 2013-02-13 ENCOUNTER — Inpatient Hospital Stay (HOSPITAL_COMMUNITY)
Admission: EM | Admit: 2013-02-13 | Discharge: 2013-02-14 | DRG: 623 | Disposition: A | Discharge status: Home / Self Care | Payer: No Typology Code available for payment source | Attending: Family Medicine | Admitting: Family Medicine

## 2013-02-13 ENCOUNTER — Observation Stay (HOSPITAL_COMMUNITY): Payer: No Typology Code available for payment source

## 2013-02-13 DIAGNOSIS — Z794 Long term (current) use of insulin: Secondary | ICD-10-CM

## 2013-02-13 DIAGNOSIS — E669 Obesity, unspecified: Secondary | ICD-10-CM | POA: Diagnosis present

## 2013-02-13 DIAGNOSIS — IMO0002 Reserved for concepts with insufficient information to code with codable children: Principal | ICD-10-CM | POA: Diagnosis present

## 2013-02-13 DIAGNOSIS — J4489 Other specified chronic obstructive pulmonary disease: Secondary | ICD-10-CM | POA: Diagnosis present

## 2013-02-13 DIAGNOSIS — E1165 Type 2 diabetes mellitus with hyperglycemia: Principal | ICD-10-CM | POA: Diagnosis present

## 2013-02-13 DIAGNOSIS — I4891 Unspecified atrial fibrillation: Secondary | ICD-10-CM | POA: Diagnosis present

## 2013-02-13 DIAGNOSIS — I771 Stricture of artery: Secondary | ICD-10-CM | POA: Diagnosis present

## 2013-02-13 DIAGNOSIS — K219 Gastro-esophageal reflux disease without esophagitis: Secondary | ICD-10-CM | POA: Diagnosis present

## 2013-02-13 DIAGNOSIS — Z87891 Personal history of nicotine dependence: Secondary | ICD-10-CM

## 2013-02-13 DIAGNOSIS — E871 Hypo-osmolality and hyponatremia: Secondary | ICD-10-CM | POA: Diagnosis present

## 2013-02-13 DIAGNOSIS — I10 Essential (primary) hypertension: Secondary | ICD-10-CM | POA: Diagnosis present

## 2013-02-13 DIAGNOSIS — E11621 Type 2 diabetes mellitus with foot ulcer: Secondary | ICD-10-CM | POA: Diagnosis present

## 2013-02-13 DIAGNOSIS — Z8673 Personal history of transient ischemic attack (TIA), and cerebral infarction without residual deficits: Secondary | ICD-10-CM

## 2013-02-13 DIAGNOSIS — E7800 Pure hypercholesterolemia, unspecified: Secondary | ICD-10-CM | POA: Diagnosis present

## 2013-02-13 DIAGNOSIS — E785 Hyperlipidemia, unspecified: Secondary | ICD-10-CM | POA: Diagnosis present

## 2013-02-13 DIAGNOSIS — L02818 Cutaneous abscess of other sites: Secondary | ICD-10-CM | POA: Diagnosis present

## 2013-02-13 DIAGNOSIS — F411 Generalized anxiety disorder: Secondary | ICD-10-CM | POA: Diagnosis present

## 2013-02-13 DIAGNOSIS — F79 Unspecified intellectual disabilities: Secondary | ICD-10-CM | POA: Diagnosis present

## 2013-02-13 DIAGNOSIS — J449 Chronic obstructive pulmonary disease, unspecified: Secondary | ICD-10-CM | POA: Diagnosis present

## 2013-02-13 DIAGNOSIS — E875 Hyperkalemia: Secondary | ICD-10-CM | POA: Diagnosis present

## 2013-02-13 DIAGNOSIS — L97509 Non-pressure chronic ulcer of other part of unspecified foot with unspecified severity: Secondary | ICD-10-CM | POA: Diagnosis present

## 2013-02-13 DIAGNOSIS — I480 Paroxysmal atrial fibrillation: Secondary | ICD-10-CM | POA: Diagnosis present

## 2013-02-13 HISTORY — PX: I & D EXTREMITY: SHX5045

## 2013-02-13 HISTORY — PX: DEBRIDMENT OF DECUBITUS ULCER: SHX6276

## 2013-02-13 LAB — BASIC METABOLIC PANEL
BUN: 35 mg/dL — ABNORMAL HIGH (ref 6–23)
CO2: 25 mEq/L (ref 19–32)
Calcium: 9.5 mg/dL (ref 8.4–10.5)
Creatinine, Ser: 1.24 mg/dL (ref 0.50–1.35)
GFR calc non Af Amer: 62 mL/min — ABNORMAL LOW (ref >90)
Glucose, Bld: 272 mg/dL — ABNORMAL HIGH (ref 70–99)
Sodium: 130 mEq/L — ABNORMAL LOW (ref 135–145)

## 2013-02-13 LAB — COMPREHENSIVE METABOLIC PANEL
ALT: 8 U/L (ref 0–53)
AST: 9 U/L (ref 0–37)
CO2: 23 mEq/L (ref 19–32)
Calcium: 9.5 mg/dL (ref 8.4–10.5)
Creatinine, Ser: 1.22 mg/dL (ref 0.50–1.35)
GFR calc Af Amer: 74 mL/min — ABNORMAL LOW (ref >90)
GFR calc non Af Amer: 64 mL/min — ABNORMAL LOW (ref >90)
Glucose, Bld: 240 mg/dL — ABNORMAL HIGH (ref 70–99)
Sodium: 129 mEq/L — ABNORMAL LOW (ref 135–145)
Total Protein: 7.9 g/dL (ref 6.0–8.3)

## 2013-02-13 LAB — URINALYSIS, ROUTINE W REFLEX MICROSCOPIC
Nitrite: NEGATIVE
Specific Gravity, Urine: 1.017 (ref 1.005–1.030)
Urobilinogen, UA: 0.2 mg/dL (ref 0.0–1.0)
pH: 5.5 (ref 5.0–8.0)

## 2013-02-13 LAB — CBC WITH DIFFERENTIAL/PLATELET
Basophils Absolute: 0.1 10*3/uL (ref 0.0–0.1)
Basophils Relative: 0 % (ref 0–1)
Eosinophils Absolute: 0.2 10*3/uL (ref 0.0–0.7)
Eosinophils Absolute: 0.3 10*3/uL (ref 0.0–0.7)
Eosinophils Relative: 2 % (ref 0–5)
Eosinophils Relative: 2 % (ref 0–5)
HCT: 35.1 % — ABNORMAL LOW (ref 39.0–52.0)
Hemoglobin: 11.6 g/dL — ABNORMAL LOW (ref 13.0–17.0)
Lymphocytes Relative: 19 % (ref 12–46)
Lymphs Abs: 2.3 10*3/uL (ref 0.7–4.0)
MCH: 24 pg — ABNORMAL LOW (ref 26.0–34.0)
MCH: 24 pg — ABNORMAL LOW (ref 26.0–34.0)
MCHC: 32.8 g/dL (ref 30.0–36.0)
MCV: 73.3 fL — ABNORMAL LOW (ref 78.0–100.0)
MCV: 73.1 fL — ABNORMAL LOW (ref 78.0–100.0)
Monocytes Absolute: 1.2 10*3/uL — ABNORMAL HIGH (ref 0.1–1.0)
Monocytes Absolute: 1.3 10*3/uL — ABNORMAL HIGH (ref 0.1–1.0)
Neutro Abs: 8.3 10*3/uL — ABNORMAL HIGH (ref 1.7–7.7)
Neutrophils Relative %: 69 % (ref 43–77)
Platelets: 289 10*3/uL (ref 150–400)
RDW: 15.1 % (ref 11.5–15.5)

## 2013-02-13 LAB — CG4 I-STAT (LACTIC ACID): Lactic Acid, Venous: 1.15 mmol/L (ref 0.5–2.2)

## 2013-02-13 LAB — URINE MICROSCOPIC-ADD ON

## 2013-02-13 SURGERY — IRRIGATION AND DEBRIDEMENT EXTREMITY
Anesthesia: General | Site: Foot | Laterality: Right | Wound class: Dirty or Infected

## 2013-02-13 MED ORDER — LIDOCAINE HCL 4 % MT SOLN
OROMUCOSAL | Status: DC | PRN
  Administered 2013-02-13: 4 mL via TOPICAL

## 2013-02-13 MED ORDER — LACTATED RINGERS IV SOLN
INTRAVENOUS | Status: DC | PRN
  Administered 2013-02-13: 23:00:00 via INTRAVENOUS

## 2013-02-13 MED ORDER — PAROXETINE HCL 20 MG PO TABS
40.0000 mg | ORAL_TABLET | Freq: Every day | ORAL | Status: DC
  Administered 2013-02-14: 40 mg via ORAL
  Filled 2013-02-13: qty 2

## 2013-02-13 MED ORDER — SODIUM CHLORIDE 0.9 % IV BOLUS (SEPSIS): 1000.0000 mL | Freq: Once | INTRAVENOUS | Status: DC

## 2013-02-13 MED ORDER — VANCOMYCIN HCL IN DEXTROSE 1-5 GM/200ML-% IV SOLN
1000.0000 mg | Freq: Two times a day (BID) | INTRAVENOUS | Status: DC
  Administered 2013-02-13 – 2013-02-14 (×2): 1000 mg via INTRAVENOUS
  Filled 2013-02-13 (×2): qty 200

## 2013-02-13 MED ORDER — ATORVASTATIN CALCIUM 40 MG PO TABS
40.0000 mg | ORAL_TABLET | Freq: Every day | ORAL | Status: DC
  Administered 2013-02-14: 40 mg via ORAL
  Filled 2013-02-13: qty 1

## 2013-02-13 MED ORDER — LIDOCAINE HCL (CARDIAC) 20 MG/ML IV SOLN
INTRAVENOUS | Status: DC | PRN
  Administered 2013-02-13: 50 mg via INTRAVENOUS

## 2013-02-13 MED ORDER — DABIGATRAN ETEXILATE MESYLATE 150 MG PO CAPS
150.0000 mg | ORAL_CAPSULE | Freq: Two times a day (BID) | ORAL | Status: DC
  Administered 2013-02-14 (×2): 150 mg via ORAL
  Filled 2013-02-13 (×3): qty 1

## 2013-02-13 MED ORDER — INSULIN ASPART 100 UNIT/ML ~~LOC~~ SOLN
0.0000 [IU] | Freq: Three times a day (TID) | SUBCUTANEOUS | Status: DC
  Administered 2013-02-14: 15 [IU] via SUBCUTANEOUS
  Administered 2013-02-14: 11 [IU] via SUBCUTANEOUS
  Administered 2013-02-14: 7 [IU] via SUBCUTANEOUS

## 2013-02-13 MED ORDER — ASPIRIN EC 81 MG PO TBEC
81.0000 mg | DELAYED_RELEASE_TABLET | Freq: Every day | ORAL | Status: DC
  Administered 2013-02-14: 81 mg via ORAL
  Filled 2013-02-13: qty 1

## 2013-02-13 MED ORDER — FENTANYL CITRATE 0.05 MG/ML IJ SOLN
INTRAMUSCULAR | Status: DC | PRN
  Administered 2013-02-13: 50 ug via INTRAVENOUS

## 2013-02-13 MED ORDER — SODIUM CHLORIDE 0.9 % IR SOLN
Status: DC | PRN
  Administered 2013-02-13: 1000 mL

## 2013-02-13 MED ORDER — SODIUM CHLORIDE 0.9 % IV BOLUS (SEPSIS)
2000.0000 mL | Freq: Once | INTRAVENOUS | Status: AC
  Administered 2013-02-13: 2000 mL via INTRAVENOUS

## 2013-02-13 MED ORDER — ACETAMINOPHEN 650 MG RE SUPP: 650.0000 mg | Freq: Four times a day (QID) | RECTAL | Status: DC | PRN

## 2013-02-13 MED ORDER — SUCCINYLCHOLINE CHLORIDE 20 MG/ML IJ SOLN: INTRAMUSCULAR | Status: DC | PRN

## 2013-02-13 MED ORDER — FUROSEMIDE 40 MG PO TABS
40.0000 mg | ORAL_TABLET | Freq: Every day | ORAL | Status: DC
Start: 2013-02-13 — End: 2013-02-14
  Administered 2013-02-14: 40 mg via ORAL
  Filled 2013-02-13: qty 1

## 2013-02-13 MED ORDER — VANCOMYCIN HCL IN DEXTROSE 1-5 GM/200ML-% IV SOLN
1000.0000 mg | Freq: Once | INTRAVENOUS | Status: AC
  Administered 2013-02-13: 1000 mg via INTRAVENOUS
  Filled 2013-02-13: qty 200

## 2013-02-13 MED ORDER — CLINDAMYCIN PHOSPHATE 600 MG/50ML IV SOLN
600.0000 mg | Freq: Three times a day (TID) | INTRAVENOUS | Status: DC
  Administered 2013-02-14: 600 mg via INTRAVENOUS
  Filled 2013-02-13 (×3): qty 50

## 2013-02-13 MED ORDER — VANCOMYCIN HCL IN DEXTROSE 1-5 GM/200ML-% IV SOLN
1000.0000 mg | Freq: Once | INTRAVENOUS | Status: AC
  Administered 2013-02-14: 1000 mg via INTRAVENOUS
  Filled 2013-02-13: qty 200

## 2013-02-13 MED ORDER — GABAPENTIN 300 MG PO CAPS
300.0000 mg | ORAL_CAPSULE | Freq: Two times a day (BID) | ORAL | Status: DC
  Administered 2013-02-14: 300 mg via ORAL
  Filled 2013-02-13 (×2): qty 1

## 2013-02-13 MED ORDER — ONDANSETRON HCL 4 MG PO TABS: 4.0000 mg | ORAL_TABLET | Freq: Three times a day (TID) | ORAL | Status: DC | PRN

## 2013-02-13 MED ORDER — METRONIDAZOLE IN NACL 5-0.79 MG/ML-% IV SOLN
500.0000 mg | Freq: Once | INTRAVENOUS | Status: AC
  Administered 2013-02-13: 500 mg via INTRAVENOUS
  Filled 2013-02-13: qty 100

## 2013-02-13 MED ORDER — SODIUM CHLORIDE 0.9 % IV BOLUS (SEPSIS)
1000.0000 mL | Freq: Once | INTRAVENOUS | Status: AC
  Administered 2013-02-13: 1000 mL via INTRAVENOUS

## 2013-02-13 MED ORDER — METRONIDAZOLE IN NACL 5-0.79 MG/ML-% IV SOLN
500.0000 mg | Freq: Three times a day (TID) | INTRAVENOUS | Status: DC
  Administered 2013-02-14 (×2): 500 mg via INTRAVENOUS
  Filled 2013-02-13 (×3): qty 100

## 2013-02-13 MED ORDER — CLINDAMYCIN PHOSPHATE 600 MG/50ML IV SOLN
600.0000 mg | Freq: Once | INTRAVENOUS | Status: AC
  Administered 2013-02-13: 600 mg via INTRAVENOUS
  Filled 2013-02-13: qty 50

## 2013-02-13 MED ORDER — METOPROLOL TARTRATE 25 MG PO TABS
25.0000 mg | ORAL_TABLET | Freq: Two times a day (BID) | ORAL | Status: DC
  Administered 2013-02-14: 25 mg via ORAL
  Filled 2013-02-13 (×2): qty 1

## 2013-02-13 MED ORDER — ACETAMINOPHEN 325 MG PO TABS
650.0000 mg | ORAL_TABLET | Freq: Four times a day (QID) | ORAL | Status: DC | PRN
  Administered 2013-02-14: 650 mg via ORAL
  Filled 2013-02-13: qty 2

## 2013-02-13 MED ORDER — PROPOFOL 10 MG/ML IV BOLUS
INTRAVENOUS | Status: DC | PRN
  Administered 2013-02-13: 150 mg via INTRAVENOUS

## 2013-02-13 MED ORDER — INSULIN GLARGINE 100 UNIT/ML ~~LOC~~ SOLN
30.0000 [IU] | Freq: Every day | SUBCUTANEOUS | Status: DC
  Administered 2013-02-14: 30 [IU] via SUBCUTANEOUS
  Filled 2013-02-13 (×2): qty 0.3

## 2013-02-13 MED ORDER — MIDAZOLAM HCL 5 MG/5ML IJ SOLN
INTRAMUSCULAR | Status: DC | PRN
  Administered 2013-02-13: 2 mg via INTRAVENOUS

## 2013-02-13 MED ORDER — GADOBENATE DIMEGLUMINE 529 MG/ML IV SOLN
20.0000 mL | Freq: Once | INTRAVENOUS | Status: AC
  Administered 2013-02-13: 20 mL via INTRAVENOUS

## 2013-02-13 MED ORDER — DILTIAZEM HCL ER COATED BEADS 240 MG PO CP24
240.0000 mg | ORAL_CAPSULE | Freq: Every day | ORAL | Status: DC
  Administered 2013-02-14: 240 mg via ORAL
  Filled 2013-02-13: qty 1

## 2013-02-13 MED ORDER — PANTOPRAZOLE SODIUM 40 MG PO TBEC
40.0000 mg | DELAYED_RELEASE_TABLET | Freq: Every day | ORAL | Status: DC
  Administered 2013-02-14: 40 mg via ORAL
  Filled 2013-02-13: qty 1

## 2013-02-13 SURGICAL SUPPLY — 47 items
BANDAGE ELASTIC 4 VELCRO ST LF (GAUZE/BANDAGES/DRESSINGS) ×1 IMPLANT
BANDAGE ELASTIC 6 VELCRO ST LF (GAUZE/BANDAGES/DRESSINGS) ×2 IMPLANT
BANDAGE GAUZE ELAST BULKY 4 IN (GAUZE/BANDAGES/DRESSINGS) ×2 IMPLANT
BNDG COHESIVE 4X5 TAN STRL (GAUZE/BANDAGES/DRESSINGS) ×1 IMPLANT
BOOTCOVER CLEANROOM LRG (PROTECTIVE WEAR) ×2 IMPLANT
CLOTH BEACON ORANGE TIMEOUT ST (SAFETY) ×2 IMPLANT
COVER SURGICAL LIGHT HANDLE (MISCELLANEOUS) ×2 IMPLANT
CUFF TOURNIQUET SINGLE 34IN LL (TOURNIQUET CUFF) ×1 IMPLANT
DRSG PAD ABDOMINAL 8X10 ST (GAUZE/BANDAGES/DRESSINGS) ×1 IMPLANT
DURAPREP 26ML APPLICATOR (WOUND CARE) ×2 IMPLANT
ELECT CAUTERY BLADE 6.4 (BLADE) ×1 IMPLANT
ELECT REM PT RETURN 9FT ADLT (ELECTROSURGICAL) ×2
ELECTRODE REM PT RTRN 9FT ADLT (ELECTROSURGICAL) IMPLANT
EVACUATOR 1/8 PVC DRAIN (DRAIN) IMPLANT
GAUZE PACKING IODOFORM 1/2 (PACKING) ×1 IMPLANT
GAUZE XEROFORM 1X8 LF (GAUZE/BANDAGES/DRESSINGS) ×2 IMPLANT
GLOVE BIOGEL PI IND STRL 7.0 (GLOVE) IMPLANT
GLOVE BIOGEL PI IND STRL 7.5 (GLOVE) IMPLANT
GLOVE BIOGEL PI INDICATOR 7.0 (GLOVE) ×1
GLOVE BIOGEL PI INDICATOR 7.5 (GLOVE) ×1
GLOVE BIOGEL PI ORTHO PRO SZ8 (GLOVE)
GLOVE ORTHO TXT STRL SZ7.5 (GLOVE) ×2 IMPLANT
GLOVE PI ORTHO PRO STRL SZ8 (GLOVE) ×1 IMPLANT
GLOVE SURG ORTHO 8.0 STRL STRW (GLOVE) ×2 IMPLANT
GLOVE SURG SS PI 7.5 STRL IVOR (GLOVE) ×1 IMPLANT
GOWN STRL NON-REIN LRG LVL3 (GOWN DISPOSABLE) ×1 IMPLANT
GOWN STRL REIN XL XLG (GOWN DISPOSABLE) ×1 IMPLANT
HANDPIECE INTERPULSE COAX TIP (DISPOSABLE) ×2
KIT BASIN OR (CUSTOM PROCEDURE TRAY) ×2 IMPLANT
KIT ROOM TURNOVER OR (KITS) ×2 IMPLANT
MANIFOLD NEPTUNE II (INSTRUMENTS) ×2 IMPLANT
NS IRRIG 1000ML POUR BTL (IV SOLUTION) ×2 IMPLANT
PACK ORTHO EXTREMITY (CUSTOM PROCEDURE TRAY) ×2 IMPLANT
PAD ARMBOARD 7.5X6 YLW CONV (MISCELLANEOUS) ×3 IMPLANT
SET HNDPC FAN SPRY TIP SCT (DISPOSABLE) IMPLANT
SPONGE GAUZE 4X4 12PLY (GAUZE/BANDAGES/DRESSINGS) ×2 IMPLANT
SPONGE LAP 18X18 X RAY DECT (DISPOSABLE) ×2 IMPLANT
STOCKINETTE IMPERVIOUS 9X36 MD (GAUZE/BANDAGES/DRESSINGS) ×1 IMPLANT
SUT ETHILON 3 0 PS 1 (SUTURE) IMPLANT
SWAB COLLECTION DEVICE MRSA (MISCELLANEOUS) ×1 IMPLANT
TOWEL OR 17X24 6PK STRL BLUE (TOWEL DISPOSABLE) ×1 IMPLANT
TOWEL OR 17X26 10 PK STRL BLUE (TOWEL DISPOSABLE) ×2 IMPLANT
TUBE ANAEROBIC SPECIMEN COL (MISCELLANEOUS) ×1 IMPLANT
TUBE CONNECTING 12X1/4 (SUCTIONS) ×2 IMPLANT
UNDERPAD 30X30 INCONTINENT (UNDERPADS AND DIAPERS) ×2 IMPLANT
WATER STERILE IRR 1000ML POUR (IV SOLUTION) ×1 IMPLANT
YANKAUER SUCT BULB TIP NO VENT (SUCTIONS) ×2 IMPLANT

## 2013-02-13 NOTE — Progress Notes (Signed)
Pt arrived to unit via stretcher at 2215 from ED after going to MRI. Nurse tech stated pt possibly may go to surgery tonight, made sure pt stayed NPO awaiting to see order to verify procedure. Around 2220 OR called to get pt, rushed to get pt ready for surgery, and pt went to surgery at 2248. Consent signed via telephone with sister, legal guardian, verified by 2 nurses. Sister just arrived to room, concerned about pt waking up from anesthesia. Sent her and other family to OR waiting room.

## 2013-02-13 NOTE — Consult Note (Signed)
ORTHOPAEDIC CONSULTATION  REQUESTING PHYSICIAN: Jacquelin Hawking, MD, family medicine  Chief Complaint: Right foot pain  HPI: [name] is a 57 y.o. male who complains of  a right foot ulcer that has been present and worsening for the month. He presented to the emergency room a week or 2 ago, with drainage and redness, and was referred to Dr. Victorino Dike, but never followed up. He came back today, with increasing pain, having difficulty walking on the foot. He denies any systemic fevers or chills. Denies any nausea or vomiting. He has fairly poorly controlled diabetes, along with multiple other medical problems as listed below. He was put on oral antibiotics, but his compliance is questionable.  Past Medical History  Diagnosis Date  . Hypertension   . Diabetes mellitus   . Mental retardation     Sister helps to take care of him and takes him to appts  . Tobacco user     Smokes 1ppd for multiple years.  Quit after hosp 09/2010.  Marland Kitchen Anemia     History of Iron Def Anemia  . Anxiety   . Hyperlipidemia   . Atrial fibrillation 06/2009    CHADS score 2 (HTN, DM), was not on coumadin, but now on Pradaxa for Afib  . Lung nodule   . Chronic low back pain   . CVA (cerebral infarction) 09/2010    Bilateral with Left > Right  . Abdominal hernia     Chronic, not a good surgical candidate  . Carotid artery occlusion   . Obesity   . COPD (chronic obstructive pulmonary disease)   . Abscess, abdomen 12/31/2010    Referred to Wound Care in 01/2011 because of multiple abd abscess with VERY large ventral hernia (please look at image of CT abd/pelvis 09/2010).  Because of hernia I was hesitant to I&D.      Marland Kitchen Stroke    Past Surgical History  Procedure Laterality Date  . Carotid arteriogram  10/2010    30% right ICA stenosis, 40% left ICA stenosis   . Transesophageal echocardiogram  09/2010    No ASD or PFO. EF 60-65%.  Normal systolic function. No evidence of thrombus.   . Transthoracic echocardiogram   09/2010     The cavity size was normal. Systolic function was vigorous.  EF 65-70%.  Normal wall funciton.    History   Social History  . Marital Status: Single    Spouse Name: N/A    Number of Children: N/A  . Years of Education: N/A   Social History Main Topics  . Smoking status: Former Smoker -- 1.00 packs/day for 42 years    Types: Cigarettes    Quit date: 11/10/2010  . Smokeless tobacco: Former Neurosurgeon    Quit date: 10/02/2010  . Alcohol Use: No  . Drug Use: No  . Sexually Active: None   Other Topics Concern  . None   Social History Narrative   Released from prison in 2003 after serving 2 years for indecency with a minor.    He smokes cigarettes greater than 30 years, also smoked a pipe.  Quit tobacco 09/2010.   Denies EtOH and drugs.    Family History  Problem Relation Age of Onset  . Heart disease Mother   . Hypertension Sister   . Heart disease Sister   . Heart disease Brother   . Heart disease Father   . Peripheral vascular disease Father   . Heart disease Maternal Grandmother   . Heart attack  Maternal Grandmother    Allergies  Allergen Reactions  . Penicillins Hives and Nausea And Vomiting   Prior to Admission medications   Medication Sig Start Date End Date Taking? Authorizing Provider  aspirin EC 81 MG EC tablet Take 1 tablet (81 mg total) by mouth daily. 11/11/12  Yes Lonia Skinner, MD  atorvastatin (LIPITOR) 40 MG tablet Take 40 mg by mouth daily.   Yes Historical Provider, MD  dabigatran (PRADAXA) 150 MG CAPS Take 150 mg by mouth every 12 (twelve) hours.   Yes Historical Provider, MD  diltiazem (CARDIZEM CD) 240 MG 24 hr capsule Take 240 mg by mouth daily.   Yes Historical Provider, MD  furosemide (LASIX) 40 MG tablet Take 1 tablet (40 mg total) by mouth daily. 11/12/12  Yes [name], MD  gabapentin (NEURONTIN) 300 MG capsule Take 300 mg by mouth 2 (two) times daily.   Yes Historical Provider, MD  LANTUS SOLOSTAR 100 UNIT/ML SOPN INJECT 30 UNITS  INTO THE SKIN 2 (TWO) TIMES DAILY. 01/25/13  Yes [name], MD  metFORMIN (GLUCOPHAGE) 1000 MG tablet Take 1 tablet (1,000 mg total) by mouth 2 (two) times daily with a meal. 11/12/12  Yes [name], MD  metoprolol tartrate (LOPRESSOR) 25 MG tablet Take 25 mg by mouth 2 (two) times daily.   Yes Historical Provider, MD  nitroGLYCERIN (NITROSTAT) 0.4 MG SL tablet Place 0.4 mg under the tongue every 5 (five) minutes as needed for chest pain. If still have pain after 2nd pill, GO TO ER. 07/23/12  Yes [name], MD  omeprazole (PRILOSEC) 20 MG capsule Take 20 mg by mouth daily.   Yes Historical Provider, MD  ondansetron (ZOFRAN) 4 MG tablet Take 1 tablet (4 mg total) by mouth every 8 (eight) hours as needed for nausea. 10/16/12  Yes Glade Nurse, PA-C  PARoxetine (PAXIL) 40 MG tablet Take 40 mg by mouth every morning.   Yes Historical Provider, MD  glucose blood test strip Use as instructed 12/13/12   [name], MD  hydrocortisone 2.5 % ointment Apply topically 3 (three) times daily. To leg around wound. Do not apply to broken skin. 06/05/12   [name], MD  Lancets [facility] ULTRASOFT) lancets Use as instructed 12/13/12   [name], MD   Dg Foot Complete Right  02/13/2013   *RADIOLOGY REPORT*  Clinical Data: Diabetic ulcer at plantar aspect right foot, pain  RIGHT FOOT COMPLETE - 3+ VIEW  Comparison: 01/22/2013  Findings: Decreased osseous mineralization. Joint spaces preserved. No acute fracture, dislocation or bone destruction. Small soft tissue defect identified at plantar aspect of the forefoot. No definite bone destruction identified to suggest osteomyelitis. Diffuse soft tissue swelling right foot.  IMPRESSION: No acute osseous abnormalities identified to suggest osteomyelitis or acute injury.   Original Report Authenticated By: Ulyses Southward, M.D.    Positive ROS: All other systems have been reviewed and were otherwise negative with the exception of  those mentioned in the HPI and as above.  Physical Exam: General: Alert, no acute distress Cardiovascular:  Mild right-sided pedal edema, with some streaking up his leg around the right ankle, although this may be a separate rash. He has 2+ dorsalis pedis pulses bilaterally.  Respiratory: No cyanosis, no use of accessory musculature GI: No organomegaly, abdomen is soft and non-tender. Moderately obese.  Skin:  He has a dark necrotic area around the plantar aspect of the third metatarsal head on the right foot. There is  mild purulence, and some drainage from this location as well. He has fairly exquisite hypersensitivity in this location. Neurologic: Sensation intact distally, and he denies any neuropathy. Psychiatric: There is a note on the patient's chart that he has mental retardation, however he seems competent for consent with normal mood and affect Lymphatic: No axillary or cervical lymphadenopathy  MUSCULOSKELETAL: Right foot has a 2 x 2 plantar ulcer over the third metatarsal head with necrosis.  Assessment: Right foot plantar metatarsal ulcer, full-thickness, although I don't see any evidence for osteomyelitis on his MRI.  Plan: This is a somewhat chronic condition, but has worsened, and is now to the point where surgical intervention is indicated. He has fairly exquisite hypersensitivity in this location, and I don't think that a bedside irrigation and debridement would be adequate. If we're going to save his foot ultimately, we need to be fairly aggressive with this, with debridement, and wound care. Therefore we'll plan to proceed with surgical intervention in order to hopefully optimize long-term function.  This procedure has been fully reviewed with the patient and written informed consent has been obtained.    Eulas Post, MD Cell [phone]   02/13/2013 10:15 PM

## 2013-02-13 NOTE — ED Provider Notes (Signed)
History    CSN: [id number] Arrival date & time 02/13/13  1303  First MD Initiated Contact with Patient 02/13/13 1531     Chief Complaint  Patient presents with  . Foot Pain   (Consider location/radiation/quality/duration/timing/severity/associated sxs/prior Treatment) The history is provided by the patient. No language interpreter was used.  [name] is a 57 y/o M with PMHx mental retardation, HTN, DM, anxiety, Afib, COPD, HLD presenting to the ED with ongoing right foot pain secondary to diabetic foot ulcer that has been ongoing for a couple of months. Patient reported that the discomfort has gotten progressively worse over the past 3 days, pain localized to the right foot described as if something breaking, that the foot is broken. Family noticed that the ulcer has gotten worse, turning black - as per patient report. Patient reported that he could not stand the pain anymore and came to the ED. Stated that the pain worsens when the patient walks, nothing makes the pain better - nothing used for the discomfort. Stated that he has had ongoing numbness to the bottoms of the feet - patient reported that this is baseline for him. Patient reported that he checks his glucose level once per week because he cannot afford blood glucose strips - reported that last week blood glucose was 545. Denied chest pain, shortness of breath, difficulty breathing, fever, drainage, nausea, vomiting, diarrhea, leg pain. PCP Dr. Levert Feinstein  Past Medical History  Diagnosis Date  . Hypertension   . Diabetes mellitus   . Mental retardation     Sister helps to take care of him and takes him to appts  . Tobacco user     Smokes 1ppd for multiple years.  Quit after hosp 09/2010.  Marland Kitchen Anemia     History of Iron Def Anemia  . Anxiety   . Hyperlipidemia   . Atrial fibrillation 06/2009    CHADS score 2 (HTN, DM), was not on coumadin, but now on Pradaxa for Afib  . Lung nodule   . Chronic low back pain   .  CVA (cerebral infarction) 09/2010    Bilateral with Left > Right  . Abdominal hernia     Chronic, not a good surgical candidate  . Carotid artery occlusion   . Obesity   . COPD (chronic obstructive pulmonary disease)   . Abscess, abdomen 12/31/2010    Referred to Wound Care in 01/2011 because of multiple abd abscess with VERY large ventral hernia (please look at image of CT abd/pelvis 09/2010).  Because of hernia I was hesitant to I&D.      Marland Kitchen Stroke    Past Surgical History  Procedure Laterality Date  . Carotid arteriogram  10/2010    30% right ICA stenosis, 40% left ICA stenosis   . Transesophageal echocardiogram  09/2010    No ASD or PFO. EF 60-65%.  Normal systolic function. No evidence of thrombus.   . Transthoracic echocardiogram  09/2010     The cavity size was normal. Systolic function was vigorous.  EF 65-70%.  Normal wall funciton.    Family History  Problem Relation Age of Onset  . Heart disease Mother   . Hypertension Sister   . Heart disease Sister   . Heart disease Brother   . Heart disease Father   . Peripheral vascular disease Father   . Heart disease Maternal Grandmother   . Heart attack Maternal Grandmother    History  Substance Use Topics  . Smoking status: Former  Smoker -- 1.00 packs/day for 42 years    Types: Cigarettes    Quit date: 11/10/2010  . Smokeless tobacco: Former Neurosurgeon    Quit date: 10/02/2010  . Alcohol Use: No    Review of Systems  Constitutional: Negative for fever.  Respiratory: Negative for cough, chest tightness and shortness of breath.   Cardiovascular: Negative for chest pain.  Gastrointestinal: Negative for nausea and vomiting.  Musculoskeletal: Positive for arthralgias (right foot and ankle pain).  Skin: Positive for wound.  Neurological: Positive for numbness (baseline for patient to have numbness in feet bilaterally). Negative for dizziness, weakness and light-headedness.  All other systems reviewed and are negative.    Allergies   Penicillins  Home Medications   Current Outpatient Rx  Name  Route  Sig  Dispense  Refill  . aspirin EC 81 MG EC tablet   Oral   Take 1 tablet (81 mg total) by mouth daily.   90 tablet   3   . atorvastatin (LIPITOR) 40 MG tablet   Oral   Take 40 mg by mouth daily.         . dabigatran (PRADAXA) 150 MG CAPS   Oral   Take 150 mg by mouth every 12 (twelve) hours.         Marland Kitchen diltiazem (CARDIZEM CD) 240 MG 24 hr capsule   Oral   Take 240 mg by mouth daily.         . furosemide (LASIX) 40 MG tablet   Oral   Take 1 tablet (40 mg total) by mouth daily.   30 tablet   2   . gabapentin (NEURONTIN) 300 MG capsule   Oral   Take 300 mg by mouth 2 (two) times daily.         Marland Kitchen LANTUS SOLOSTAR 100 UNIT/ML SOPN      INJECT 30 UNITS INTO THE SKIN 2 (TWO) TIMES DAILY.   6 pen   2   . metFORMIN (GLUCOPHAGE) 1000 MG tablet   Oral   Take 1 tablet (1,000 mg total) by mouth 2 (two) times daily with a meal.   60 tablet   2   . metoprolol tartrate (LOPRESSOR) 25 MG tablet   Oral   Take 25 mg by mouth 2 (two) times daily.         Marland Kitchen omeprazole (PRILOSEC) 20 MG capsule   Oral   Take 20 mg by mouth daily.         Marland Kitchen PARoxetine (PAXIL) 40 MG tablet   Oral   Take 40 mg by mouth every morning.         Marland Kitchen glucose blood test strip      Use as instructed   100 each   3   . hydrocortisone 2.5 % ointment   Topical   Apply topically 3 (three) times daily. To leg around wound. Do not apply to broken skin.   30 g   0   . Lancets (ONETOUCH ULTRASOFT) lancets      Use as instructed   100 each   3   . nitroGLYCERIN (NITROSTAT) 0.4 MG SL tablet   Sublingual   Place 0.4 mg under the tongue every 5 (five) minutes as needed for chest pain. If still have pain after 2nd pill, GO TO ER.         . ondansetron (ZOFRAN) 4 MG tablet   Oral   Take 1 tablet (4 mg total) by mouth every 8 (eight) hours  as needed for nausea.   10 tablet   0    BP 129/59  Pulse 77  Temp(Src)  98.5 F (36.9 C) (Oral)  Resp 16  SpO2 90% Physical Exam  Nursing note and vitals reviewed. Constitutional: He is oriented to person, place, and time. He appears well-developed and well-nourished. No distress.  HENT:  Head: Normocephalic and atraumatic.  Eyes: Conjunctivae and EOM are normal. Pupils are equal, round, and reactive to light. Right eye exhibits no discharge. Left eye exhibits no discharge.  Neck: Normal range of motion. Neck supple.  Cardiovascular: Normal rate, regular rhythm and normal heart sounds.   Pulses:      Radial pulses are 2+ on the right side, and 2+ on the left side.       Dorsalis pedis pulses are 2+ on the right side, and 2+ on the left side.  DP pulses 2+ bilaterally  Pulmonary/Chest: Effort normal and breath sounds normal. No respiratory distress.  Musculoskeletal: Normal range of motion.  Strength 5+/5+ with resistance  Neurological: He is alert and oriented to person, place, and time. He exhibits normal muscle tone. Coordination normal.  Skin: Skin is warm. He is not diaphoretic. There is erythema.  Erythema and swelling noted to the right foot and ankle. Warmth to touch upon palpation. Approximately 1.0 cm x 1.5 cm diabetic ulcer noted to the bottom of the right foot black in color - negative drainage noted - malodorous.   Psychiatric: He has a normal mood and affect. His behavior is normal. Thought content normal.    ED Course  Procedures (including critical care time)  5:35PM Spoke with Dr. Jacquelin Hawking from [facility] - will come down to assess patient.   7:20PM Spoke with Dr. Durene Cal from [facility], reported for MRI of right foot to be performed in order for patient to be admitted so orthopedics can get involved.   Labs Reviewed  CBC WITH DIFFERENTIAL - Abnormal; Notable for the following:    WBC 12.0 (*)    Hemoglobin 11.6 (*)    HCT 35.4 (*)    MCV 73.3 (*)    MCH 24.0 (*)    Neutro Abs 8.3 (*)    Monocytes Absolute  1.2 (*)    All other components within normal limits  BASIC METABOLIC PANEL - Abnormal; Notable for the following:    Sodium 130 (*)    Chloride 93 (*)    Glucose, Bld 272 (*)    BUN 35 (*)    GFR calc non Af Amer 62 (*)    GFR calc Af Amer 72 (*)    All other components within normal limits  GLUCOSE, CAPILLARY - Abnormal; Notable for the following:    Glucose-Capillary 247 (*)    All other components within normal limits  CBC WITH DIFFERENTIAL - Abnormal; Notable for the following:    WBC 11.9 (*)    Hemoglobin 11.5 (*)    HCT 35.1 (*)    MCV 73.1 (*)    MCH 24.0 (*)    Neutro Abs 8.1 (*)    Monocytes Absolute 1.3 (*)    All other components within normal limits  COMPREHENSIVE METABOLIC PANEL - Abnormal; Notable for the following:    Sodium 129 (*)    Chloride 95 (*)    Glucose, Bld 240 (*)    BUN 33 (*)    Albumin 3.1 (*)    GFR calc non Af Amer 64 (*)    GFR calc  Af Amer 74 (*)    All other components within normal limits  URINALYSIS, ROUTINE W REFLEX MICROSCOPIC - Abnormal; Notable for the following:    APPearance HAZY (*)    Glucose, UA 250 (*)    Hgb urine dipstick LARGE (*)    Protein, ur >300 (*)    All other components within normal limits  URINE MICROSCOPIC-ADD ON - Abnormal; Notable for the following:    Squamous Epithelial / LPF MANY (*)    All other components within normal limits  WOUND CULTURE   Dg Foot Complete Right  02/13/2013   *RADIOLOGY REPORT*  Clinical Data: Diabetic ulcer at plantar aspect right foot, pain  RIGHT FOOT COMPLETE - 3+ VIEW  Comparison: 01/22/2013  Findings: Decreased osseous mineralization. Joint spaces preserved. No acute fracture, dislocation or bone destruction. Small soft tissue defect identified at plantar aspect of the forefoot. No definite bone destruction identified to suggest osteomyelitis. Diffuse soft tissue swelling right foot.  IMPRESSION: No acute osseous abnormalities identified to suggest osteomyelitis or acute injury.    Original Report Authenticated By: Ulyses Southward, M.D.   No diagnosis found.  MDM  Patient presenting to the ED with diabetic foot ulcer to the bottom of the right foot that has been ongoing. Patient was seen in the ED 01/22/2013 where MRI performed to the right foot - negative findings of osteomyelitis noted. Patient reported that pain has gotten worse over the past 3 days - constant pain. Swelling and warmth to touch of the right foot. Approximately 1.0 cm x 1.5 cm diabetic ulcer noted to the bottom of the right foot - black in color. Pain upon palpation. Strength intact. Motion intact. Pulses palpable. Doubt gangrene. Suspicion to be cellulitis and diabetic foot ulcer infection. Dr. Jinger Neighbors saw and assessed patient - recommended IV antibiotics. Patient started on IV clindamycin, flagyl, and vancomycin with 3L fluids. Called for admission to family medicine practice. UA spillage of glucose noted, proteins, blood noted. CBC mild elevation of WBC (11.9). CMP glucose 240 - negative anion gap - negative DKA. Imaging negative for osteomyelitis bone changes or acute injury.     Dr. Durene Cal from [name] spoke with attending who recommended to get MRI performed today in order for admission and orthopedics consultation. Patient admitted to [facility] practice for diabetic foot ulcer - admitting physician Dr. Tish Frederickson, possible debridement needed.    Raymon Mutton, PA-C 02/13/13 2243

## 2013-02-13 NOTE — Anesthesia Procedure Notes (Signed)
Procedure Name: Intubation Date/Time: 02/13/2013 11:43 PM Performed by: Tura Roller S Pre-anesthesia Checklist: Patient identified, Timeout performed, Emergency Drugs available, Suction available and Patient being monitored Patient Re-evaluated:Patient Re-evaluated prior to inductionOxygen Delivery Method: Circle system utilized Preoxygenation: Pre-oxygenation with 100% oxygen Intubation Type: IV induction Ventilation: Mask ventilation without difficulty Laryngoscope Size: Mac and 4 Grade View: Grade I Tube type: Oral Tube size: 7.5 mm Number of attempts: 1 Airway Equipment and Method: Stylet Placement Confirmation: ETT inserted through vocal cords under direct vision,  positive ETCO2 and breath sounds checked- equal and bilateral Secured at: 22 cm Tube secured with: Tape Dental Injury: Teeth and Oropharynx as per pre-operative assessment

## 2013-02-13 NOTE — Anesthesia Preprocedure Evaluation (Addendum)
Anesthesia Evaluation  Patient identified by MRN, date of birth, ID band Patient awake    Reviewed: Allergy & Precautions, H&P , NPO status , Patient's Chart, lab work & pertinent test results, reviewed documented beta blocker date and time   Airway Mallampati: I TM Distance: >3 FB Neck ROM: Full    Dental no notable dental hx. (+) Poor Dentition and Dental Advisory Given   Pulmonary COPDformer smoker,  breath sounds clear to auscultation  Pulmonary exam normal       Cardiovascular hypertension, On Home Beta Blockers and On Medications + Peripheral Vascular Disease + dysrhythmias Atrial Fibrillation Rhythm:Regular Rate:Normal     Neuro/Psych PSYCHIATRIC DISORDERS CVA, Residual Symptoms    GI/Hepatic negative GI ROS, Neg liver ROS,   Endo/Other  negative endocrine ROSdiabetes, Type 1, Insulin Dependent  Renal/GU negative Renal ROS  negative genitourinary   Musculoskeletal   Abdominal (+) + obese,   Peds  Hematology negative hematology ROS (+) anemia ,   Anesthesia Other Findings   Reproductive/Obstetrics negative OB ROS                           Anesthesia Physical Anesthesia Plan  ASA: III and emergent  Anesthesia Plan: General   Post-op Pain Management:    Induction: Intravenous  Airway Management Planned: Oral ETT  Additional Equipment:   Intra-op Plan:   Post-operative Plan: Extubation in OR  Informed Consent: I have reviewed the patients History and Physical, chart, labs and discussed the procedure including the risks, benefits and alternatives for the proposed anesthesia with the patient or authorized representative who has indicated his/her understanding and acceptance.   Dental advisory given  Plan Discussed with: CRNA  Anesthesia Plan Comments:        Anesthesia Quick Evaluation

## 2013-02-13 NOTE — Progress Notes (Signed)
CBG at 2240 was 190-not uploading into epic as of yet

## 2013-02-13 NOTE — H&P (Signed)
Family Medicine Teaching [facility] Admission History and Physical Service Pager: [phone]  Patient name: [name] Medical record number: [id number] Date of birth: [birth year] Age: 57 y.o. Gender: male  Primary Care Provider: Levert Feinstein, MD of [facility] Consultants: Orthopedics Dr. Dion Saucier Code Status: Full  Chief Complaint: Foot Pain  Assessment and Plan: [name] is a 57 y.o. year old male presenting with a right foot ulcer . PMH is significant for Diabetes (type II), hypercholesterolemia, mental retardation.  1. Diabetic Foot Ulcer - history of uncontrolled diabetes. Says he has recently run in the 300 range. No fever, tachycardia,  nausea/vomiting, chills. Wound with mild  Purulence/drainage with necrotic tissue surrounding the punctured area. [ ]  consider ABIs  [ ]  f/u Ortho consult (Dr. Lajoyce Corners) in the AM  *addendum patient was taken to OR this evening for debridement. Did not see results of MRI available before debridement but Dr. Dion Saucier did not believe it extended to bone.   *had planned on adding ESR/CRP but given procedure already completed, will defer for now [ ]  start vanc/clinda/flagyl [ ]  f/u MRI of foot  2. Diabetes (type II) - currently on metformin and lantus at home. Says recent blood sugars have been in the 300 range. - start lantus 30 qhs - start sliding scale resistant  3. Hyponatremia - last sodium: 129 (corrects to 131 with CBG). Baseline is around 133. [ ]  monitor bmet  3. Hypercholesterolemia - continue home atorvastatin 40mg   4. A-fib - continue diltiazem and pradaxa - monitor on tele  5. HTN  - continue home meds - monitor BP  6. Sciatica - continue home gabapentin  7. GERD - start pantoprazole 40mg  (home meds: omeprazole 20mg )  FEN/GI: Cardiac diet Prophylaxis: Heparin  Disposition: Admit to observation with tele.  History of Present Illness: [name] is a 57 y.o. year old male presenting  with right foot pain. He came to the ED 01/22/13 with foot pain and a foot ulcer. While in the ED, he was prescribed 2 weeks of cipro and flagyl. He also received a CAM walker. He was supposed to be seen by Dr. Victorino Dike within a few days of his ED visit. 4 days ago, [name] removed his CAM walker because he felt like the swelling had gone down. Pain was controlled while using the boot. After removal, he had increasing pain in that right foot. His pain is rated up to a 10/10. Pain improved when he did not stand on his foot. His pain worsened after two falls mainly on top of his foot and ankle. Denies fever, chills, nausea/vomiting. He has intermittent swelling of his right foot.  Per EDP, family was very concerned because they noted an acute worsening in swelling of leg, redness on bottom of foot as well as up the leg slightly. Pain was significantly worse as well so they brought him in.    Review Of Systems: Per HPI with the following additions:  Otherwise 12 point review of systems was performed and was unremarkable.  Patient Active Problem List   Diagnosis Date Noted  . Wound, open, foot 12/13/2012  . Anemia 12/13/2012  . Chest tightness 08/29/2012  . Heart murmur, systolic 08/25/2012  . Chest discomfort 07/25/2012  . Hyperkalemia 05/22/2012  . Diabetic leg ulcer 05/15/2012  . Routine health maintenance 05/15/2012  . Leg weakness, bilateral 12/10/2011  . Carotid artery stenosis, asymptomatic 08/31/2011  . Sciatica 05/22/2011  . Ventral hernia 01/24/2011  . Incidental lung  nodule, greater than or equal to 8mm 11/24/2010  . CVA (cerebral vascular accident) 10/24/2010  . Atrial fibrillation 07/24/2009  . ANXIETY 06/22/2007  . DM (diabetes mellitus), type 2, uncontrolled 12/22/2006  . HYPERTENSION 12/22/2006  . HYPERCHOLESTEROLEMIA 10/05/2006  . TOBACCO DEPENDENCE 10/05/2006  . MENTAL RETARDATION 10/05/2006  . COPD 10/05/2006   Past Medical History: Past Medical History  Diagnosis  Date  . Hypertension   . Diabetes mellitus   . Mental retardation     Sister helps to take care of him and takes him to appts  . Tobacco user     Smokes 1ppd for multiple years.  Quit after hosp 09/2010.  Marland Kitchen Anemia     History of Iron Def Anemia  . Anxiety   . Hyperlipidemia   . Atrial fibrillation 06/2009    CHADS score 2 (HTN, DM), was not on coumadin, but now on Pradaxa for Afib  . Lung nodule   . Chronic low back pain   . CVA (cerebral infarction) 09/2010    Bilateral with Left > Right  . Abdominal hernia     Chronic, not a good surgical candidate  . Carotid artery occlusion   . Obesity   . COPD (chronic obstructive pulmonary disease)   . Abscess, abdomen 12/31/2010    Referred to Wound Care in 01/2011 because of multiple abd abscess with VERY large ventral hernia (please look at image of CT abd/pelvis 09/2010).  Because of hernia I was hesitant to I&D.      Marland Kitchen Stroke    Past Surgical History: Past Surgical History  Procedure Laterality Date  . Carotid arteriogram  10/2010    30% right ICA stenosis, 40% left ICA stenosis   . Transesophageal echocardiogram  09/2010    No ASD or PFO. EF 60-65%.  Normal systolic function. No evidence of thrombus.   . Transthoracic echocardiogram  09/2010     The cavity size was normal. Systolic function was vigorous.  EF 65-70%.  Normal wall funciton.    Social History: History  Substance Use Topics  . Smoking status: Former Smoker -- 1.00 packs/day for 42 years    Types: Cigarettes    Quit date: 11/10/2010  . Smokeless tobacco: Former Neurosurgeon    Quit date: 10/02/2010  . Alcohol Use: No  Quit smoking 3 years ago (praised patient).   Additional social history:   Please also refer to relevant sections of EMR.  Family History: Family History  Problem Relation Age of Onset  . Heart disease Mother   . Hypertension Sister   . Heart disease Sister   . Heart disease Brother   . Heart disease Father   . Peripheral vascular disease Father   .  Heart disease Maternal Grandmother   . Heart attack Maternal Grandmother    Allergies and Medications: Allergies  Allergen Reactions  . Penicillins Hives and Nausea And Vomiting   No current facility-administered medications on file prior to encounter.   Current Outpatient Prescriptions on File Prior to Encounter  Medication Sig Dispense Refill  . aspirin EC 81 MG EC tablet Take 1 tablet (81 mg total) by mouth daily.  90 tablet  3  . atorvastatin (LIPITOR) 40 MG tablet Take 40 mg by mouth daily.      . dabigatran (PRADAXA) 150 MG CAPS Take 150 mg by mouth every 12 (twelve) hours.      Marland Kitchen diltiazem (CARDIZEM CD) 240 MG 24 hr capsule Take 240 mg by mouth daily.      Marland Kitchen  furosemide (LASIX) 40 MG tablet Take 1 tablet (40 mg total) by mouth daily.  30 tablet  2  . gabapentin (NEURONTIN) 300 MG capsule Take 300 mg by mouth 2 (two) times daily.      Marland Kitchen LANTUS SOLOSTAR 100 UNIT/ML SOPN INJECT 30 UNITS INTO THE SKIN 2 (TWO) TIMES DAILY.  6 pen  2  . metFORMIN (GLUCOPHAGE) 1000 MG tablet Take 1 tablet (1,000 mg total) by mouth 2 (two) times daily with a meal.  60 tablet  2  . metoprolol tartrate (LOPRESSOR) 25 MG tablet Take 25 mg by mouth 2 (two) times daily.      Marland Kitchen omeprazole (PRILOSEC) 20 MG capsule Take 20 mg by mouth daily.      Marland Kitchen PARoxetine (PAXIL) 40 MG tablet Take 40 mg by mouth every morning.      Marland Kitchen glucose blood test strip Use as instructed  100 each  3  . hydrocortisone 2.5 % ointment Apply topically 3 (three) times daily. To leg around wound. Do not apply to broken skin.  30 g  0  . Lancets (ONETOUCH ULTRASOFT) lancets Use as instructed  100 each  3  . nitroGLYCERIN (NITROSTAT) 0.4 MG SL tablet Place 0.4 mg under the tongue every 5 (five) minutes as needed for chest pain. If still have pain after 2nd pill, GO TO ER.      . ondansetron (ZOFRAN) 4 MG tablet Take 1 tablet (4 mg total) by mouth every 8 (eight) hours as needed for nausea.  10 tablet  0    Objective: BP 145/66  Pulse 72   Temp(Src) 98.5 F (36.9 C) (Oral)  Resp 16  SpO2 97% Exam: General: Well appearing, Obese,  HEENT: moist mucous membranes Cardiovascular: RRR, II/VI systolic murmur (noted in patient history)  Respiratory: Clear to ausculation bilaterally, no wheeze Abdomen: Obese, soft, non-tender, non-distended Extremities: The right foot is warm, edematous (2+ pitting), tender, with possible venous stasis change. On plantar surface near the 4th metatarsal, there is an approximately 0.5cm puncture wound with necrotic tissue surrounding. Bone not able to be probed. The left foot had no edema, was non-tender. Venous stasis changes not notable.  Neuro: Alert & Oriented  Labs and Imaging: CBC BMET   Recent Labs Lab 02/13/13 1620  WBC 11.9*  HGB 11.5*  HCT 35.1*  PLT 289    Recent Labs Lab 02/13/13 1620  NA 129*  K 5.0  CL 95*  CO2 23  BUN 33*  CREATININE 1.22  GLUCOSE 240*  CALCIUM 9.5     X-ray (7/9) IMPRESSION:  No acute osseous abnormalities identified to suggest osteomyelitis  or acute injury.   Jacquelin Hawking, MD 02/13/2013, 6:04 PM PGY-1, [facility] Family Medicine FPTS Intern pager: [phone], text pages welcome  Family Medicine Upper Level Addendum:   I have seen and examined the patient independently, discussed with Nettey, fully reviewed the H+P and agree with it's contents with the additions as noted in blue text.    Tana Conch, MD, PGY3 02/14/2013 12:50 AM

## 2013-02-13 NOTE — ED Notes (Addendum)
Ptreports to the ED for eval of pain and non-healing wound to the right ball of his foot. Pt estimates it is approx 1 inch in diameter. He is a diabetic and reports that his CBGs have been running high. Pt reports the area is turning black and has been causing increased pain. A&O x4. Pt ambulatory PTA.

## 2013-02-13 NOTE — Progress Notes (Signed)
ANTIBIOTIC CONSULT NOTE - INITIAL  Pharmacy Consult for vancomycin Indication: diabetic foot ulcer, r/o osteo  Allergies  Allergen Reactions  . Penicillins Hives and Nausea And Vomiting    Patient Measurements:   Adjusted Body Weight:   Vital Signs: Temp: 98.5 F (36.9 C) (07/09 1310) Temp src: Oral (07/09 1310) BP: 129/59 mmHg (07/09 1615) Pulse Rate: 77 (07/09 1615) Intake/Output from previous day:   Intake/Output from this shift:    Labs:  Recent Labs  02/13/13 1415 02/13/13 1620  WBC 12.0* 11.9*  HGB 11.6* 11.5*  PLT 273 289  CREATININE 1.24 1.22   The CrCl is unknown because both a height and weight (above a minimum accepted value) are required for this calculation. No results found for this basename: VANCOTROUGH, VANCOPEAK, VANCORANDOM, GENTTROUGH, GENTPEAK, GENTRANDOM, TOBRATROUGH, TOBRAPEAK, TOBRARND, AMIKACINPEAK, AMIKACINTROU, AMIKACIN,  in the last 72 hours   Microbiology: No results found for this or any previous visit (from the past 720 hour(s)).  Medical History: Past Medical History  Diagnosis Date  . Hypertension   . Diabetes mellitus   . Mental retardation     Sister helps to take care of him and takes him to appts  . Tobacco user     Smokes 1ppd for multiple years.  Quit after hosp 09/2010.  Marland Kitchen Anemia     History of Iron Def Anemia  . Anxiety   . Hyperlipidemia   . Atrial fibrillation 06/2009    CHADS score 2 (HTN, DM), was not on coumadin, but now on Pradaxa for Afib  . Lung nodule   . Chronic low back pain   . CVA (cerebral infarction) 09/2010    Bilateral with Left > Right  . Abdominal hernia     Chronic, not a good surgical candidate  . Carotid artery occlusion   . Obesity   . COPD (chronic obstructive pulmonary disease)   . Abscess, abdomen 12/31/2010    Referred to Wound Care in 01/2011 because of multiple abd abscess with VERY large ventral hernia (please look at image of CT abd/pelvis 09/2010).  Because of hernia I was hesitant to  I&D.      Marland Kitchen Stroke     Medications:  Anti-infectives   Start     Dose/Rate Route Frequency Ordered Stop   02/14/13 1000  vancomycin (VANCOCIN) IVPB 1000 mg/200 mL premix     1,000 mg 200 mL/hr over 60 Minutes Intravenous Every 12 hours 02/13/13 2012     02/13/13 2015  vancomycin (VANCOCIN) IVPB 1000 mg/200 mL premix     1,000 mg 200 mL/hr over 60 Minutes Intravenous  Once 02/13/13 2012     02/13/13 1745  vancomycin (VANCOCIN) IVPB 1000 mg/200 mL premix     1,000 mg 200 mL/hr over 60 Minutes Intravenous  Once 02/13/13 1730 02/13/13 1934   02/13/13 1745  clindamycin (CLEOCIN) IVPB 600 mg     600 mg 100 mL/hr over 30 Minutes Intravenous  Once 02/13/13 1730 02/13/13 1935   02/13/13 1745  metroNIDAZOLE (FLAGYL) IVPB 500 mg     500 mg 100 mL/hr over 60 Minutes Intravenous  Once 02/13/13 1730       Assessment: 56 yom presented to the ED with diabetic foot ulcer. To start empiric antibiotics for foot ulcer and possible osteo. Pt is currently afebrile and WBC is 11.2. Pt has good renal function. He has received a dose of clindamycin 600mg  IV, vanc 1gm IV and flagyl 500mg  IV so far in the ED.   Vanc  7/9>> Clinda 7/9>>  Goal of Therapy:  Vancomycin trough level 15-20 mcg/ml  Plan:  1. Give additional vanc 1gm now to make a total load of 2gm then Vanc 1gm IV Q12H 2. F/u renal fxn, C&S, clinical status and trough at [facility], Drake Leach 02/13/2013,8:10 PM

## 2013-02-13 NOTE — ED Provider Notes (Signed)
Medical screening examination/treatment/procedure(s) were conducted as a shared visit with non-physician practitioner(s) and myself.  I personally evaluated the patient during the encounter.  This 57 year old diabetic has chronic right foot pain at baseline but now is worsening right foot pain for last few days and family reports the patient's right foot is more red and swollen and tender without discharge compared to baseline the patient's right midfoot and forefoot have erythema induration tenderness edema baseline neuropathy light touch capillary refill less than 2 seconds and plantar ulcer without purulent drainage or subcutaneous emphysema.  Hurman Horn, MD 02/16/13 2041

## 2013-02-13 NOTE — Preoperative (Addendum)
Beta Blockers   Reason not to administer Beta Blockers:Not Applicable, taken this am

## 2013-02-14 ENCOUNTER — Encounter (HOSPITAL_COMMUNITY): Payer: Self-pay | Admitting: Orthopedic Surgery

## 2013-02-14 DIAGNOSIS — E11621 Type 2 diabetes mellitus with foot ulcer: Secondary | ICD-10-CM | POA: Diagnosis present

## 2013-02-14 DIAGNOSIS — L97509 Non-pressure chronic ulcer of other part of unspecified foot with unspecified severity: Secondary | ICD-10-CM

## 2013-02-14 DIAGNOSIS — E1169 Type 2 diabetes mellitus with other specified complication: Secondary | ICD-10-CM

## 2013-02-14 LAB — GLUCOSE, CAPILLARY
Glucose-Capillary: 190 mg/dL — ABNORMAL HIGH (ref 70–99)
Glucose-Capillary: 274 mg/dL — ABNORMAL HIGH (ref 70–99)
Glucose-Capillary: 307 mg/dL — ABNORMAL HIGH (ref 70–99)

## 2013-02-14 MED ORDER — ONDANSETRON HCL 4 MG/2ML IJ SOLN
INTRAMUSCULAR | Status: DC | PRN
  Administered 2013-02-13: 4 mg via INTRAVENOUS

## 2013-02-14 MED ORDER — POTASSIUM CHLORIDE IN NACL 20-0.45 MEQ/L-% IV SOLN
INTRAVENOUS | Status: DC
  Administered 2013-02-14: 02:00:00 via INTRAVENOUS
  Filled 2013-02-14 (×3): qty 1000

## 2013-02-14 MED ORDER — DIPHENHYDRAMINE HCL 12.5 MG/5ML PO ELIX: 12.5000 mg | ORAL_SOLUTION | ORAL | Status: DC | PRN

## 2013-02-14 MED ORDER — POLYETHYLENE GLYCOL 3350 17 G PO PACK: 17.0000 g | PACK | Freq: Every day | ORAL | Status: DC | PRN

## 2013-02-14 MED ORDER — ONDANSETRON HCL 4 MG PO TABS: 4.0000 mg | ORAL_TABLET | Freq: Four times a day (QID) | ORAL | Status: DC | PRN

## 2013-02-14 MED ORDER — HYDROCODONE-ACETAMINOPHEN 10-325 MG PO TABS
1.0000 | ORAL_TABLET | ORAL | Status: DC | PRN
  Administered 2013-02-14: 2 via ORAL
  Filled 2013-02-14: qty 2

## 2013-02-14 MED ORDER — CLINDAMYCIN HCL 300 MG PO CAPS: 300.0000 mg | ORAL_CAPSULE | Freq: Three times a day (TID) | ORAL | Status: DC

## 2013-02-14 MED ORDER — ONDANSETRON HCL 4 MG/2ML IJ SOLN: 4.0000 mg | Freq: Four times a day (QID) | INTRAMUSCULAR | Status: DC | PRN

## 2013-02-14 MED ORDER — CLINDAMYCIN HCL 300 MG PO CAPS
300.0000 mg | ORAL_CAPSULE | Freq: Three times a day (TID) | ORAL | Status: DC
  Administered 2013-02-14: 300 mg via ORAL
  Filled 2013-02-14 (×3): qty 1

## 2013-02-14 MED ORDER — CIPROFLOXACIN HCL 750 MG PO TABS: 750.0000 mg | ORAL_TABLET | Freq: Two times a day (BID) | ORAL | Status: DC

## 2013-02-14 MED ORDER — METHOCARBAMOL 500 MG PO TABS: 500.0000 mg | ORAL_TABLET | Freq: Four times a day (QID) | ORAL | Status: DC | PRN

## 2013-02-14 MED ORDER — DEXTROSE 5 % IV SOLN
500.0000 mg | Freq: Four times a day (QID) | INTRAVENOUS | Status: DC | PRN
  Filled 2013-02-14: qty 5

## 2013-02-14 MED ORDER — ZOLPIDEM TARTRATE 5 MG PO TABS: 5.0000 mg | ORAL_TABLET | Freq: Every evening | ORAL | Status: DC | PRN

## 2013-02-14 MED ORDER — SENNA 8.6 MG PO TABS
1.0000 | ORAL_TABLET | Freq: Two times a day (BID) | ORAL | Status: DC
  Administered 2013-02-14: 8.6 mg via ORAL
  Filled 2013-02-14 (×2): qty 1

## 2013-02-14 MED ORDER — METOCLOPRAMIDE HCL 5 MG/ML IJ SOLN
5.0000 mg | Freq: Three times a day (TID) | INTRAMUSCULAR | Status: DC | PRN
Start: 2013-02-14 — End: 2013-02-14

## 2013-02-14 MED ORDER — SODIUM CHLORIDE 0.9 % IR SOLN
Status: DC | PRN
  Administered 2013-02-14: 3000 mL

## 2013-02-14 MED ORDER — METOCLOPRAMIDE HCL 10 MG PO TABS: 5.0000 mg | ORAL_TABLET | Freq: Three times a day (TID) | ORAL | Status: DC | PRN

## 2013-02-14 MED ORDER — DOCUSATE SODIUM 100 MG PO CAPS
100.0000 mg | ORAL_CAPSULE | Freq: Two times a day (BID) | ORAL | Status: DC
  Administered 2013-02-14: 100 mg via ORAL
  Filled 2013-02-14 (×2): qty 1

## 2013-02-14 MED ORDER — MORPHINE SULFATE 2 MG/ML IJ SOLN: 1.0000 mg | INTRAMUSCULAR | Status: DC | PRN

## 2013-02-14 MED ORDER — BISACODYL 10 MG RE SUPP: 10.0000 mg | Freq: Every day | RECTAL | Status: DC | PRN

## 2013-02-14 MED ORDER — HYDROCODONE-ACETAMINOPHEN 10-325 MG PO TABS: 1.0000 | ORAL_TABLET | ORAL | Status: DC | PRN

## 2013-02-14 MED ORDER — CIPROFLOXACIN HCL 750 MG PO TABS
750.0000 mg | ORAL_TABLET | Freq: Two times a day (BID) | ORAL | Status: DC
  Administered 2013-02-14: 750 mg via ORAL
  Filled 2013-02-14 (×2): qty 1

## 2013-02-14 NOTE — Progress Notes (Signed)
Orthopedic Tech Progress Note Patient Details:  [name] 12/22/1955 [id number]  Ortho Devices Type of Ortho Device: Darco shoe   Haskell Flirt 02/14/2013, 1:38 AM

## 2013-02-14 NOTE — Transfer of Care (Signed)
Immediate Anesthesia Transfer of Care Note  Patient: [name]  Procedure(s) Performed: Procedure(s) with comments: IRRIGATION AND DEBRIDEMENT FOOT ULCER (Right) - PULSE LAVAGE  Patient Location: PACU  Anesthesia Type:General  Level of Consciousness: awake, alert  and oriented  Airway & Oxygen Therapy: Patient Spontanous Breathing and Patient connected to nasal cannula oxygen  Post-op Assessment: Report given to PACU RN and Post -op Vital signs reviewed and stable  Post vital signs: Reviewed and stable  Complications: No apparent anesthesia complications

## 2013-02-14 NOTE — Consult Note (Signed)
WOC consult Note Reason for Consult: evaluate wound s/p I&D right foot neuropathic foot ulcer Wound type:neuropathic foot ulcer-right foot  Measurement:1.5cm x 1.0cm x 0.5cm  Wound ZOX:WRUEA, 90% pink and clean, some cautery sites in base look dark for now Drainage (amount, consistency, odor) minimal, sanguinous on dressing from OR. Periwound: intact Dressing procedure/placement/frequency: agree to continue iodoform packing strip for the antibacterial properties, cover with foam. Offloading shoe.   Would recommend HHRN to assist with home wound care and teaching his sister to perform.    Re consult if needed, will not follow at this time. Thanks  Javana Schey [employer], CWOCN [phone])

## 2013-02-14 NOTE — Progress Notes (Signed)
Inpatient Diabetes Program Recommendations  AACE/ADA: New Consensus Statement on Inpatient Glycemic Control (2013)  Target Ranges:  Prepandial:   less than 140 mg/dL      Peak postprandial:   less than 180 mg/dL (1-2 hours)      Critically ill patients:  140 - 180 mg/dL   Hyperglycemia in mid 200's  Inpatient Diabetes Program Recommendations Insulin - Basal: Pt takes lantus 30 units bid at home..  If patient is not discharged early today, please order basal lantus 30 units am and 30 units at HS  Thank you, Lenor Coffin, RN, Diabetes CNS [phone])

## 2013-02-14 NOTE — Progress Notes (Signed)
Orthopedic Tech Progress Note Patient Details:  [name] 1956/06/16 [id number]  Ortho Devices Type of Ortho Device: Crutches Ortho Device/Splint Interventions: Ordered;Adjustment   Jennye Moccasin 02/14/2013, 3:15 PM

## 2013-02-14 NOTE — Progress Notes (Signed)
Patient ID: [name], male   DOB: Feb 05, 1956, 57 y.o.   MRN: [id number]     Subjective:  Patient reports pain as mild.  He states that he is doing okay denies any CP or SOB Wound care nurse recommends off loading shoe   Objective:   VITALS:   Filed Vitals:   02/14/13 0116 02/14/13 0217 02/14/13 0407 02/14/13 0522  BP: 128/57 110/88 140/65 138/65  Pulse: 80 71 78 83  Temp: 98.4 F (36.9 C) 98 F (36.7 C) 98.1 F (36.7 C) 97.8 F (36.6 C)  TempSrc:      Resp: 16 16 16 16   SpO2: 96% 99% 100% 99%    ABD soft Dorsiflexion/Plantar flexion intact Incision: dressing C/D/I and no drainage Sensation is at baseline  In right lower ext.   Lab Results  Component Value Date   WBC 11.9* 02/13/2013   HGB 11.5* 02/13/2013   HCT 35.1* 02/13/2013   MCV 73.1* 02/13/2013   PLT 289 02/13/2013     Assessment/Plan: 1 Day Post-Op   Principal Problem:   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 Active Problems:   DM (diabetes mellitus), type 2, uncontrolled   HYPERCHOLESTEROLEMIA   HYPERTENSION   Atrial fibrillation   Advance diet Up with therapy Continue plan per medicine Patient has an appointment with Dr Victorino Dike next week Agree with wound care nurse with plan for off loading shoe and choice of dressing    Haskel Khan 02/14/2013, 1:55 PM   Teryl Lucy, MD Cell [phone]

## 2013-02-14 NOTE — Progress Notes (Signed)
Family Medicine Teaching Service Daily Progress Note Intern Pager: [phone]  Patient name: [name] Medical record number: [id number] Date of birth: 07/16/1956 Age: 57 y.o. Gender: male  Primary Care Provider: Levert Feinstein, MD Consultants: orthepedics, [place] Code Status: full  Pt Overview and Major Events to Date:  7/9 - admitted for foot pain/ulcer 7/9 - debridement by ortho, mri neg for osteo  Assessment and Plan:[name] is a 57 y.o. year old male presenting with a right foot ulcer . PMH is significant for Diabetes (type II), hypercholesterolemia, mental retardation.  1. Diabetic Foot Ulcer - history of uncontrolled diabetes. Says he has recently run in the 300 range. No fever, tachycardia, nausea/vomiting, chills. Wound with mild Purulence/drainage with necrotic tissue surrounding the punctured area.  - s/p debridement 7/9 - mri neg for osteo - consider ABIs  [ ]  f/u Ortho recs, wound cx [ ]  switch from vanc/clinda/flagyl to clinda/cipro po  2. Diabetes (type II) - currently on metformin and lantus at home. Says recent blood sugars have been in the 300 range. 190-270 since admission - start lantus 30 qhs, resume bid on d/c  - start sliding scale resistant   3. Hyponatremia - last sodium: 129 (corrects to 131 with CBG). Baseline is around 133.  [ ]  monitor bmet   3. Hypercholesterolemia  - continue home atorvastatin 40mg    4. A-fib  - continue diltiazem and pradaxa  - monitor on tele   5. HTN  - continue home meds  - monitor BP   6. Sciatica  - continue home gabapentin   7. GERD  - start pantoprazole 40mg  (home meds: omeprazole 20mg )   FEN/GI: Cardiac diet  Prophylaxis: Heparin  Disposition: this afternoon w/ home health wound care and ortho and fm f/u  Subjective: doing well this am, pain much improved   Objective: Temp:  [97.8 F (36.6 C)-99.5 F (37.5 C)] 97.8 F (36.6 C) (07/10 0522) Pulse Rate:  [71-107] 83 (07/10 0522) Resp:   [15-17] 16 (07/10 0522) BP: (110-154)/(47-88) 138/65 mmHg (07/10 0522) SpO2:  [90 %-100 %] 99 % (07/10 0522) Physical Exam: General: Well appearing, Obese,  HEENT: moist mucous membranes  Cardiovascular: RRR Respiratory: Clear to ausculation bilaterally, no wheeze  Abdomen: Obese, soft, non-tender, non-distended  Extremities: R foot wrapped by ortho, not visualized this am. The left foot had no edema, was non-tender 2+ dp pulse. Venous stasis changes not notable.  Neuro: Alert & Oriented  Laboratory:  Recent Labs Lab 02/13/13 1415 02/13/13 1620  WBC 12.0* 11.9*  HGB 11.6* 11.5*  HCT 35.4* 35.1*  PLT 273 289    Recent Labs Lab 02/13/13 1415 02/13/13 1620  NA 130* 129*  K 4.5 5.0  CL 93* 95*  CO2 25 23  BUN 35* 33*  CREATININE 1.24 1.22  CALCIUM 9.5 9.5  PROT  --  7.9  BILITOT  --  0.4  ALKPHOS  --  73  ALT  --  8  AST  --  9  GLUCOSE 272* 240*   Lactic acid 1.15  Imaging/Diagnostic Tests: MRI foot: Plantar skin ulceration at the level of the third metatarsal head  with changes of cellulitis but no abscess. Very mild edema and  enhancement of plantar surface of the heads of the third and fourth  metatarsals is new since the prior study but is likely reactive  rather than secondary to osteomyelitis.   Beverely Low, MD 02/14/2013, 6:48 AM PGY-1, [facility] Family Medicine FPTS Intern pager: [phone], text  pages welcome

## 2013-02-14 NOTE — Progress Notes (Signed)
ANTIBIOTIC CONSULT NOTE - Follow-up Consult  Pharmacy Consult for vancomycin Indication: diabetic foot ulcer   Allergies  Allergen Reactions  . Penicillins Hives and Nausea And Vomiting    Patient Measurements:  wt: 104.4 kg Adjusted Body Weight:   Vital Signs: Temp: 97.8 F (36.6 C) (07/10 0522) Temp src: Oral (07/09 2250) BP: 138/65 mmHg (07/10 0522) Pulse Rate: 83 (07/10 0522) Intake/Output from previous day: 07/09 0701 - 07/10 0700 In: 990 [P.O.:240; I.V.:750] Out: 810 [Urine:800; Blood:10] Intake/Output from this shift: Total I/O In: 240 [P.O.:240] Out: 350 [Urine:350]  Labs:  Recent Labs  02/13/13 1415 02/13/13 1620  WBC 12.0* 11.9*  HGB 11.6* 11.5*  PLT 273 289  CREATININE 1.24 1.22   The CrCl is unknown because both a height and weight (above a minimum accepted value) are required for this calculation. No results found for this basename: VANCOTROUGH, VANCOPEAK, VANCORANDOM, GENTTROUGH, GENTPEAK, GENTRANDOM, TOBRATROUGH, TOBRAPEAK, TOBRARND, AMIKACINPEAK, AMIKACINTROU, AMIKACIN,  in the last 72 hours   Microbiology: No results found for this or any previous visit (from the past 720 hour(s)).  Medications:  Anti-infectives   Start     Dose/Rate Route Frequency Ordered Stop   02/14/13 1000  vancomycin (VANCOCIN) IVPB 1000 mg/200 mL premix     1,000 mg 200 mL/hr over 60 Minutes Intravenous Every 12 hours 02/13/13 2012     02/14/13 0200  clindamycin (CLEOCIN) IVPB 600 mg  Status:  Discontinued     600 mg 100 mL/hr over 30 Minutes Intravenous Every 8 hours 02/13/13 2221 02/14/13 0948   02/14/13 0000  metroNIDAZOLE (FLAGYL) IVPB 500 mg  Status:  Discontinued     500 mg 100 mL/hr over 60 Minutes Intravenous Every 8 hours 02/13/13 2141 02/14/13 0948   02/13/13 2015  vancomycin (VANCOCIN) IVPB 1000 mg/200 mL premix     1,000 mg 200 mL/hr over 60 Minutes Intravenous  Once 02/13/13 2012 02/14/13 0304   02/13/13 1745  vancomycin (VANCOCIN) IVPB 1000 mg/200 mL  premix     1,000 mg 200 mL/hr over 60 Minutes Intravenous  Once 02/13/13 1730 02/13/13 1934   02/13/13 1745  clindamycin (CLEOCIN) IVPB 600 mg     600 mg 100 mL/hr over 30 Minutes Intravenous  Once 02/13/13 1730 02/13/13 1935   02/13/13 1745  metroNIDAZOLE (FLAGYL) IVPB 500 mg     500 mg 100 mL/hr over 60 Minutes Intravenous  Once 02/13/13 1730 02/13/13 2034     Assessment: 56 yom presented to the ED with diabetic foot ulcer, now s/p surgical debridement, no osteo.  Currently receiving vancomycin, asked to add zosyn.  Pt is currently afebrile and WBC is 11.9.  SCr stable at 1.2> CrCl ~68 ml/min.  Vanc 7/9>> Clinda 7/9>>7/10  Goal of Therapy:  Vancomycin trough level 15-20 mcg/ml  Plan:  - Continue vancomycin 1gm IV Q12H - F/u renal fxn, C&S, clinical status and trough at [facility] - Notified MD of PCN allergy>>d/c zosyn  Jill Side L. Illene Bolus, PharmD, BCPS Clinical Pharmacist Pager: [phone] Pharmacy: [phone] 02/14/2013 10:15 AM

## 2013-02-14 NOTE — H&P (Signed)
FMTS Attending Admit Note Patient seen and examined by me today, discussed with admitting resident and I agree with his management and plan. Patient has been to OR since admission, appreciate Ortho consult and debridement. On exam he has alopecia of legs, weak pulse dp in the L (unaffected) foot.  R foot bandaged post-op, I did not evaluate the R dp pulse.  Plan for evaluation for suspected arterial insufficiency which would make healing more difficult. SW consult (has been difficult for patient to adhere to management as outpatient, will need to assess ways to support him once he leaves the hospital). Paula Compton, MD

## 2013-02-14 NOTE — Progress Notes (Addendum)
FMTS Attending Admission Note: Joseph Rocha,MD I  have seen and examined this patient, reviewed their chart. I have discussed this patient with the resident. I agree with the resident's findings, assessment and care plan.Sodium level dropped a little,might be due to hydration,recommend recheck Bmet at follow up with his PCP.

## 2013-02-14 NOTE — Progress Notes (Signed)
Pt discharged to home accompanied by family. Discharge instructions and rx given and explained and pt stated understanding. Pts IV was removed prior to leaving. Pt left unit in a stable condition via wheelchair.

## 2013-02-14 NOTE — Anesthesia Postprocedure Evaluation (Signed)
  Anesthesia Post-op Note  Patient: [name]  Procedure(s) Performed: Procedure(s) with comments: IRRIGATION AND DEBRIDEMENT FOOT ULCER (Right) - PULSE LAVAGE  Patient Location: PACU  Anesthesia Type:General  Level of Consciousness: awake, alert  and oriented  Airway and Oxygen Therapy: Patient Spontanous Breathing and Patient connected to nasal cannula oxygen  Post-op Pain: none  Post-op Assessment: Post-op Vital signs reviewed, Patient's Cardiovascular Status Stable, Respiratory Function Stable, Patent Airway and No signs of Nausea or vomiting  Post-op Vital Signs: Reviewed and stable  Complications: No apparent anesthesia complications

## 2013-02-14 NOTE — Op Note (Signed)
02/13/2013 - 02/14/2013  12:18 AM  PATIENT:  [name]    PRE-OPERATIVE DIAGNOSIS:  Right Foot Ulcer  POST-OPERATIVE DIAGNOSIS:  Same  PROCEDURE:  IRRIGATION AND DEBRIDEMENT FOOT ULCER with excision of skin, subcutaneous tissue, fascia  SURGEON:  Eulas Post, MD  ANESTHESIA:   General  PREOPERATIVE INDICATIONS:  MAYJOR AGER is a  57 y.o. male with a diagnosis of Right Foot Ulcer who presented to the hospital with worsening foul-smelling painful ulcer. He had already been seen earlier in the month, but failed to followup or management.  The risks benefits and alternatives were discussed with the patient preoperatively including but not limited to the risks of infection, bleeding, nerve injury, cardiopulmonary complications, the need for revision surgery, among others, and the patient was willing to proceed. We also discussed the potential for the future if of amputation, recurrent infection, chronic wound problems, among others.  OPERATIVE IMPLANTS: None  OPERATIVE FINDINGS: Purulence extending down beneath the subcutaneous tissue to the level of the fascia, although I do not think that it truly exposed bone. It stopped just short of the flexor tendons. The ulcer measured 1.5 x 1.5 cm in width, and approximately 1.5 cm in depth.  OPERATIVE PROCEDURE: The patient was brought to the operating room and placed in the supine position. General anesthesia was administered. The right lower extremity was prepped and draped in usual sterile fashion. The leg was elevated and exsanguinated and a tourniquet was inflated. Time out was performed. Sharp incision was made using a scalpel removing the skin, subcutaneous tissue, and necrotic tissue below. I also used a rongeur to remove any abnormal tissue. Once complete debridement had been carried out, and there was a clean bed below, I irrigated 3 L of fluid through the wound. The wound was packed with iodoform gauze and sterile gauze and a light  compression wrap. The tourniquet was released. He was awakened and returned to the PACU in stable and satisfactory condition. There were no complications.

## 2013-02-14 NOTE — Care Management Note (Signed)
    Page 1 of 1   02/14/2013     3:16:00 PM   CARE MANAGEMENT NOTE 02/14/2013  Patient:  [name], [name]   Account Number:  [id number]  Date Initiated:  02/14/2013  Documentation initiated by:  [facility]  Subjective/Objective Assessment:   admitted for rt foot ulcer, had I and D     Action/Plan:   return home with [facility]   Anticipated DC Date:  02/15/2013   Anticipated DC Plan:  HOME W HOME HEALTH SERVICES      DC Planning Services  CM consult      Choice offered to / List presented to:  C-5 Sibling        HH arranged  HH-1 RN      [facility] agency  [facility]   Status of service:  Completed, signed off Medicare Important Message given?   (If response is "NO", the following Medicare IM given date fields will be blank) Date Medicare IM given:   Date Additional Medicare IM given:    Discharge Disposition:  HOME W HOME HEALTH SERVICES  Per UR Regulation:    If discussed at Long Length of Stay Meetings, dates discussed:    Comments:  02/14/13 Spoke with patient, received permission to speak with his sister [name] about HHC. Contacted Ms Autwell,whom patient lives with, she selected [facility] which the patient has had in the past. Contacted [name] at [employer] and set up [facility] for wound care. Jacquelynn Cree RN, BSn, CCM

## 2013-02-14 NOTE — Progress Notes (Signed)
The patient was referred to Dr. Victorino Dike last week, and has an appointment next Friday with him.  I have spoken with Dr. Victorino Dike, who will see him as already scheduled next Friday as an outpatient.    Wound care per wound team in the meantime.  OK for discharge when home wound care in place and ok with primary team.    Eulas Post, MD

## 2013-02-15 NOTE — Discharge Summary (Signed)
Family Medicine Teaching [facility] Discharge Summary  Patient name: [name] Medical record number: [id number] Date of birth: 03/12/1956 Age: 57 y.o. Gender: male Date of Admission: 02/13/2013  Date of Discharge: 02/15/2013  Admitting Physician: Barbaraann Barthel, MD  Primary Care Provider: Levert Feinstein, MD Consultants: ortho  Indication for Hospitalization: diabetic foot ulcer  Discharge Diagnoses/Problem List:  Patient Active Problem List   Diagnosis Date Noted  . 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 02/14/2013  . Wound, open, foot 12/13/2012  . Anemia 12/13/2012  . Chest tightness 08/29/2012  . Heart murmur, systolic 08/25/2012  . Chest discomfort 07/25/2012  . Hyperkalemia 05/22/2012  . Diabetic leg ulcer 05/15/2012  . Routine health maintenance 05/15/2012  . Leg weakness, bilateral 12/10/2011  . Carotid artery stenosis, asymptomatic 08/31/2011  . Sciatica 05/22/2011  . Ventral hernia 01/24/2011  . Incidental lung nodule, greater than or equal to 8mm 11/24/2010  . CVA (cerebral vascular accident) 10/24/2010  . Atrial fibrillation 07/24/2009  . ANXIETY 06/22/2007  . DM (diabetes mellitus), type 2, uncontrolled 12/22/2006  . HYPERTENSION 12/22/2006  . HYPERCHOLESTEROLEMIA 10/05/2006  . TOBACCO DEPENDENCE 10/05/2006  . MENTAL RETARDATION 10/05/2006  . COPD 10/05/2006     Disposition: home with home health wound care  Discharge Condition: stable  Brief Hospital Course: [name] is a 57 y.o. year old male presenting with a right foot ulcer . PMH is significant for Diabetes (type II), hypercholesterolemia, mental retardation.   1. Diabetic Foot Ulcer - history of uncontrolled diabetes. Says he has recently run in the 300 range. No fever, tachycardia, nausea/vomiting, chills. Wound with mild Purulence/drainage with necrotic tissue surrounding the punctured area.  - s/p debridement 7/9  - mri neg for osteo  - f/u with ortho  outpt, arranged - s/p vanc/clinda/flagyl IV - home on clinda/cipro po   2. Diabetes (type II) - currently on metformin and lantus at home. Says recent blood sugars have been in the 300 range. 190-270 since admission  - start lantus 30 qhs, resume bid on d/c  - start sliding scale resistant   3. Hyponatremia - last sodium: 129 (corrects to 131 with CBG). Baseline is around 133.   3. Hypercholesterolemia  - continue home atorvastatin 40mg    4. A-fib  - continue diltiazem and pradaxa   5. HTN  - continue home meds  - monitor BP   6. Sciatica  - continue home gabapentin   7. GERD  - start pantoprazole 40mg  (home meds: omeprazole 20mg ), resume on d/c   Issues for Follow Up:   # Ulcer: patient was scheduled with [facility] for ortho f/u. He also should be getting HH wound care and taking antibiotics. Questionable chronic venous stasis contributing to the picture, may need ABI, pulse was palpable on L but unable to check R due to bandaging.  # DM: metformin plus lantus 30u bid, assess control, may need more lantus or addition of mealtime  # Hyponatremia, recheck bmet, assess hydration.   Significant Procedures: surgical debridement of R foot ulcer by ortho  Significant Labs and Imaging:   Recent Labs Lab 02/13/13 1415 02/13/13 1620  WBC 12.0* 11.9*  HGB 11.6* 11.5*  HCT 35.4* 35.1*  PLT 273 289    Recent Labs Lab 02/13/13 1415 02/13/13 1620  NA 130* 129*  K 4.5 5.0  CL 93* 95*  CO2 25 23  GLUCOSE 272* 240*  BUN 35* 33*  CREATININE 1.24 1.22  CALCIUM 9.5 9.5  ALKPHOS  --  73  AST  --  9  ALT  --  8  ALBUMIN  --  3.1*   Results for orders placed during the hospital encounter of 02/13/13 (from the past 24 hour(s))  GLUCOSE, CAPILLARY     Status: Abnormal   Collection Time    02/14/13 12:39 AM      Result Value Range   Glucose-Capillary 274 (*) 70 - 99 mg/dL  GLUCOSE, CAPILLARY     Status: Abnormal   Collection Time    02/14/13  1:42 AM      Result Value  Range   Glucose-Capillary 251 (*) 70 - 99 mg/dL  GLUCOSE, CAPILLARY     Status: Abnormal   Collection Time    02/14/13  6:48 AM      Result Value Range   Glucose-Capillary 299 (*) 70 - 99 mg/dL  GLUCOSE, CAPILLARY     Status: Abnormal   Collection Time    02/14/13 11:15 AM      Result Value Range   Glucose-Capillary 307 (*) 70 - 99 mg/dL   Comment 1 Documented in Chart     Comment 2 Notify RN    GLUCOSE, CAPILLARY     Status: Abnormal   Collection Time    02/14/13  3:49 PM      Result Value Range   Glucose-Capillary 201 (*) 70 - 99 mg/dL   Comment 1 Documented in Chart     Comment 2 Notify RN      Outstanding Results: Wound cultures (aerobic and anaerobic)  Discharge Medications:    Medication List         aspirin 81 MG EC tablet  Take 1 tablet (81 mg total) by mouth daily.     atorvastatin 40 MG tablet  Commonly known as:  LIPITOR  Take 40 mg by mouth daily.     ciprofloxacin 750 MG tablet  Commonly known as:  CIPRO  Take 1 tablet (750 mg total) by mouth 2 (two) times daily.     clindamycin 300 MG capsule  Commonly known as:  CLEOCIN  Take 1 capsule (300 mg total) by mouth every 8 (eight) hours.     dabigatran 150 MG Caps  Commonly known as:  PRADAXA  Take 150 mg by mouth every 12 (twelve) hours.     diltiazem 240 MG 24 hr capsule  Commonly known as:  CARDIZEM CD  Take 240 mg by mouth daily.     furosemide 40 MG tablet  Commonly known as:  LASIX  Take 1 tablet (40 mg total) by mouth daily.     gabapentin 300 MG capsule  Commonly known as:  NEURONTIN  Take 300 mg by mouth 2 (two) times daily.     glucose blood test strip  Use as instructed     HYDROcodone-acetaminophen 10-325 MG per tablet  Commonly known as:  NORCO  Take 1-2 tablets by mouth every 4 (four) hours as needed.     hydrocortisone 2.5 % ointment  Apply topically 3 (three) times daily. To leg around wound. Do not apply to broken skin.     LANTUS SOLOSTAR 100 UNIT/ML Sopn  Generic  drug:  Insulin Glargine  INJECT 30 UNITS INTO THE SKIN 2 (TWO) TIMES DAILY.     metFORMIN 1000 MG tablet  Commonly known as:  GLUCOPHAGE  Take 1 tablet (1,000 mg total) by mouth 2 (two) times daily with a meal.     metoprolol tartrate 25 MG tablet  Commonly known as:  LOPRESSOR  Take 25 mg by mouth 2 (two) times daily.     nitroGLYCERIN 0.4 MG SL tablet  Commonly known as:  NITROSTAT  Place 0.4 mg under the tongue every 5 (five) minutes as needed for chest pain. If still have pain after 2nd pill, GO TO ER.     omeprazole 20 MG capsule  Commonly known as:  PRILOSEC  Take 20 mg by mouth daily.     ondansetron 4 MG tablet  Commonly known as:  ZOFRAN  Take 1 tablet (4 mg total) by mouth every 8 (eight) hours as needed for nausea.     onetouch ultrasoft lancets  Use as instructed     PARoxetine 40 MG tablet  Commonly known as:  PAXIL  Take 40 mg by mouth every morning.        Discharge Instructions: Please refer to Patient Instructions section of EMR for full details.  Patient was counseled important signs and symptoms that should prompt return to medical care, changes in medications, dietary instructions, activity restrictions, and follow up appointments.   Follow-Up Appointments:     Follow-up Information   Follow up with Toni Arthurs, MD In 1 week. (already has an apt next friday.)    Contact information:   [address] [place] [address] [phone]       Follow up with Levert Feinstein, MD On 02/20/2013. (at 3:15pm for hospital follow up appointment)    Contact information:   [address] [place] [address] [phone]       Beverely Low, MD 02/15/2013, 12:08 AM PGY-1, [facility]

## 2013-02-15 NOTE — Discharge Summary (Signed)
FMTS Attending Admission Note: Nathali Vent,MD I  have seen and examined this patient, reviewed their chart. I have discussed this patient with the resident. I agree with the resident's findings, assessment and care plan.  

## 2013-02-17 LAB — WOUND CULTURE

## 2013-02-18 LAB — WOUND CULTURE

## 2013-02-19 LAB — ANAEROBIC CULTURE

## 2013-02-20 ENCOUNTER — Inpatient Hospital Stay: Payer: No Typology Code available for payment source | Admitting: Family Medicine

## 2013-02-27 ENCOUNTER — Other Ambulatory Visit: Payer: Self-pay | Admitting: Family Medicine

## 2013-03-01 ENCOUNTER — Telehealth: Payer: Self-pay | Admitting: Family Medicine

## 2013-03-01 NOTE — Telephone Encounter (Signed)
To [facility] red team - please call pt and let him know I have refilled his metformin and paxil but he needs to schedule an appointment to follow up on his chronic medical conditions. He no showed to his most recent appointment. Thanks!

## 2013-03-05 ENCOUNTER — Telehealth: Payer: Self-pay | Admitting: *Deleted

## 2013-03-05 NOTE — Telephone Encounter (Signed)
Called patient and scheduled hospital f/u appt for 03/15/2013.  Christionna Poland, Darlyne Russian, CMA

## 2013-03-15 ENCOUNTER — Ambulatory Visit: Payer: No Typology Code available for payment source | Admitting: Family Medicine

## 2013-03-27 ENCOUNTER — Encounter (HOSPITAL_COMMUNITY): Payer: Self-pay | Admitting: Pharmacy Technician

## 2013-03-27 NOTE — Pre-Procedure Instructions (Signed)
[name]  03/27/2013   Your procedure is scheduled on:  Friday, March 29, 2013  Report to [facility] Short Stay Center at 10:15 AM.  Call this number if you have problems the morning of surgery: [phone]   Remember:   Do not eat food or drink liquids after midnight.   Take these medicines the morning of surgery with A SIP OF WATER: diltiazem (CARDIZEM CD) 240 MG 24 hr capsule , gabapentin (NEURONTIN) 300 MG capsule, metoprolol tartrate (LOPRESSOR) 25 MG tablet, omeprazole (PRILOSEC) 20 MG capsule, PARoxetine (PAXIL) 40 MG tablet if needed: ondansetron (ZOFRAN) 4 MG tablet for nausea, HYDROcodone-acetaminophen (NORCO) 10-325 MG per tablet for pain Do NOT take any diabetic medications the morning of your surgery   Do not wear jewelry.  Do not wear lotions, powders, or colognes. You may wear deodorant.   Men may shave face and neck.  Do not bring valuables to the hospital.  [facility] is not responsible  for any belongings or valuables.  Contacts, dentures or bridgework may not be worn into surgery.  Leave suitcase in the car. After surgery it may be brought to your room.  For patients admitted to the hospital, checkout time is 11:00 AM the day of discharge.   Patients discharged the day of surgery will not be allowed to drive home.  Name and phone number of your driver:   Special Instructions: Shower using CHG 2 nights before surgery and the night before surgery.  If you shower the day of surgery use CHG.  Use special wash - you have one bottle of CHG for all showers.  You should use approximately 1/3 of the bottle for each shower.   Please read over the following fact sheets that you were given: Pain Booklet, Coughing and Deep Breathing and Surgical Site Infection Prevention

## 2013-03-28 ENCOUNTER — Other Ambulatory Visit (HOSPITAL_COMMUNITY): Payer: Self-pay | Admitting: Orthopedic Surgery

## 2013-03-28 ENCOUNTER — Other Ambulatory Visit (HOSPITAL_COMMUNITY): Payer: No Typology Code available for payment source

## 2013-03-28 ENCOUNTER — Encounter (HOSPITAL_COMMUNITY): Payer: Self-pay

## 2013-03-28 ENCOUNTER — Encounter (HOSPITAL_COMMUNITY)
Admission: RE | Admit: 2013-03-28 | Discharge: 2013-03-28 | Disposition: A | Discharge status: Home / Self Care | Payer: Medicare (Managed Care) | Source: Ambulatory Visit | Attending: Orthopedic Surgery | Admitting: Orthopedic Surgery

## 2013-03-28 HISTORY — DX: Pruritus, unspecified: L29.9

## 2013-03-28 HISTORY — DX: Gastro-esophageal reflux disease without esophagitis: K21.9

## 2013-03-28 HISTORY — DX: Pneumonia, unspecified organism: J18.9

## 2013-03-28 LAB — PROTIME-INR
INR: 1.3 (ref 0.00–1.49)
Prothrombin Time: 15.9 seconds — ABNORMAL HIGH (ref 11.6–15.2)

## 2013-03-28 LAB — COMPREHENSIVE METABOLIC PANEL
Alkaline Phosphatase: 78 U/L (ref 39–117)
BUN: 22 mg/dL (ref 6–23)
Calcium: 9.4 mg/dL (ref 8.4–10.5)
Creatinine, Ser: 1.08 mg/dL (ref 0.50–1.35)
GFR calc Af Amer: 86 mL/min — ABNORMAL LOW (ref >90)
Glucose, Bld: 189 mg/dL — ABNORMAL HIGH (ref 70–99)
Potassium: 5 mEq/L (ref 3.5–5.1)
Total Protein: 7.4 g/dL (ref 6.0–8.3)

## 2013-03-28 LAB — CBC
HCT: 32.5 % — ABNORMAL LOW (ref 39.0–52.0)
Hemoglobin: 10.4 g/dL — ABNORMAL LOW (ref 13.0–17.0)
MCH: 23.3 pg — ABNORMAL LOW (ref 26.0–34.0)
MCHC: 32 g/dL (ref 30.0–36.0)
MCV: 72.7 fL — ABNORMAL LOW (ref 78.0–100.0)

## 2013-03-28 MED ORDER — DEXTROSE 5 % IV SOLN: 900.0000 mg | INTRAVENOUS | Status: DC

## 2013-03-28 MED ORDER — DEXTROSE 5 % IV SOLN
900.0000 mg | INTRAVENOUS | Status: AC
  Administered 2013-03-29: 900 mg via INTRAVENOUS
  Filled 2013-03-28: qty 6

## 2013-03-28 NOTE — Progress Notes (Signed)
This patient has screened at risk for sleep apnea using the STOP Bang tool used during a pre-surgical visit. A score of 4 or greater is at risk for sleep apnea. 

## 2013-03-28 NOTE — Progress Notes (Signed)
Anesthesia PAT Evaluation: Patient is a 57 year old male scheduled for right foot third and possible fourth toe ray amputation for osteomyelitis on 03/29/13 by Dr. Lajoyce Corners.  Case is posted for [place].  He is s/p I&D of right foot ulcer in the OR on 02/13/13.  History includes mild mental retardation, morbid obesity, CVA ~ 3 years ago, DM2, COPD, GERD, HLD, HTN, afib '10, anxiety, abdominal hernia, RUL lung nodule reevaluated with CT in 11/2010 and felt likely of benign etiology, moderate carotid artery stenosis followed by [facility].    He was evaluated by cardiologist Dr. Elease Hashimoto on 08/27/12 for follow-up afib and evaluation for chest pains.  A nuclear stress test was done and felt overall low risk.  Patient had another episode yesterday while sitting on the couch. Patient reports he has been having these at least 1-2 years and takes on average two Nitro/month (one for each episode). He has never had to take more than one nitroglycerin and is aware that he should contact EMS if symptoms are not relieved after two doses. Due to his mental retardation, it was somewhat difficult for his to describe his chest pain, but he said they were left side and typically sharp.  They usually occur with rest and can last up to twenty minutes, but often are relieved within five minutes after taking Nitro.  There is no diaphoresis, jaw pain, SOB.  Both his arms can feel week at time, but not really pain and not really associated with his chest pain.  He is not very active, but can walk around his house with a can.  He denies SOB at rest, but does have chronic DOE with mild activity.    Exam shows a pleasant male, sitting in a wheelchair.  Speech is mildly dysarthric.  He is obese with a large pannus.  Heart RRR, with I-II/VI SEM heard on the RSB.  Lungs clear. 1+ LLE pre-tibial edema. 1-2+ on the right pre-tibial edema with mild erythema noted.  His right foot appears edematous, but is wrapped.    Carotid duplex on 08/29/12 showed:  Right ICA  40 - 59 % stenosis, Left ICA 40 - 59 % stenosis.  EKG on 03/28/13 showed NSR, low voltage QRS, right BBB.  It was not felt significantly changed since his last tracing.  Echo on 10/16/12 showed: - Left ventricle: The cavity size was normal. There was mild concentric hypertrophy. Systolic function was vigorous. The estimated ejection fraction was in the range of 65% to 70%. Wall motion was normal; there were no regional wall motion abnormalities. Doppler parameters are consistent with abnormal left ventricular relaxation (grade 1 diastolic dysfunction). The E/e' ratio is >10, suggesting elevated LV filling pressure. - Left atrium: The atrium was normal in size. - Inferior vena cava: The vessel was normal in size; the respirophasic diameter changes were in the normal range (= 50%); findings are consistent with normal central venous pressure. (The aortic valve was not addressed in this echo report but he had trivial AR by echo on 06/30/09.)  Nuclear stress test on 09/06/12 showed: Myoview scan with small apical defect Seen in horizontal images only. Cannot exclude tiny region of apical ischemia vs shift soft tissue. Otherwise normal perfusion. Overall low risk scan. LV Ejection Fraction: 69%. LV Wall Motion: NL LV Function; NL Wall Motion.   CXR on 11/09/12 showed no active disease.  Preoperative labs noted.  Dr. Lajoyce Corners did not want patient to hold his Pradaxa, so this may be contributing to  the elevated PTT.  Patient has right foot infection/osteomylities and need toe amputations.  He reports these episodes of chest pain have been occurring for 1-2 years.  He does not feel that his symptoms have changed or progressed.  He does not feel that they are more frequent.  He had a low risk stress test within the past year and tolerated foot surgery last month.  His EKG is stable. If no change in his symptomology then I would anticipate that he could proceed as planned with this procedure.  Reviewed with  anesthesiologist Dr. Katrinka Blazing.  He agrees with this plan.  May also be able to consider regional anesthesia.    Velna Ochs [facility] Short Stay Center/Anesthesiology Phone [phone] 03/28/2013 12:55 PM

## 2013-03-28 NOTE — Progress Notes (Signed)
Nurse called Elnita Maxwell at Dr. Audrie Lia office to question about patient taking Pradaxa. Elnita Maxwell stated that patient did not need to stop taking medication. Will inform patient of this and request that patient not take Pradaxa tomorrow morning.

## 2013-03-28 NOTE — Progress Notes (Signed)
Patient informed Nurse that his PCP Levert Feinstein [facility] and he goes there several times a week. Cardiologist is Dr. Kristeen Miss and LOV was in January 2014. Patient also informed Nurse that he had chest pain yesterday while he was watching television and took one nitroglycerin. Patient stated "it was not any different than the chest pain I usually have. Revonda Standard, [place] informed of this and requested that patient had a repeat EKG. Will obtain as requested.

## 2013-03-28 NOTE — Progress Notes (Signed)
Currently patient is being seen by Revonda Standard, [place].

## 2013-03-28 NOTE — Progress Notes (Signed)
Patient unable to read. DOS instructions reviewed carefully. Patient stated he lived with his sister and her boyfriend. Nurse requested that patient have sister assist with medications he needs to take tomorrow before coming to short stay. Patient verbalized understanding.

## 2013-03-29 ENCOUNTER — Encounter (HOSPITAL_COMMUNITY): Payer: Self-pay | Admitting: Vascular Surgery

## 2013-03-29 ENCOUNTER — Ambulatory Visit (HOSPITAL_COMMUNITY): Payer: Medicare (Managed Care) | Admitting: Anesthesiology

## 2013-03-29 ENCOUNTER — Encounter (HOSPITAL_COMMUNITY)
Admission: RE | Disposition: A | Discharge status: Home / Self Care | Payer: Self-pay | Source: Ambulatory Visit | Attending: Orthopedic Surgery

## 2013-03-29 ENCOUNTER — Observation Stay (HOSPITAL_COMMUNITY)
Admission: RE | Admit: 2013-03-29 | Discharge: 2013-03-30 | Disposition: A | Discharge status: Home / Self Care | Payer: Medicare (Managed Care) | Source: Ambulatory Visit | Attending: Orthopedic Surgery | Admitting: Orthopedic Surgery

## 2013-03-29 ENCOUNTER — Encounter (HOSPITAL_COMMUNITY): Payer: Self-pay | Admitting: *Deleted

## 2013-03-29 DIAGNOSIS — L02619 Cutaneous abscess of unspecified foot: Secondary | ICD-10-CM | POA: Insufficient documentation

## 2013-03-29 DIAGNOSIS — L03039 Cellulitis of unspecified toe: Secondary | ICD-10-CM | POA: Insufficient documentation

## 2013-03-29 DIAGNOSIS — Z79899 Other long term (current) drug therapy: Secondary | ICD-10-CM | POA: Insufficient documentation

## 2013-03-29 DIAGNOSIS — Z8673 Personal history of transient ischemic attack (TIA), and cerebral infarction without residual deficits: Secondary | ICD-10-CM | POA: Insufficient documentation

## 2013-03-29 DIAGNOSIS — M86171 Other acute osteomyelitis, right ankle and foot: Secondary | ICD-10-CM

## 2013-03-29 DIAGNOSIS — Z01810 Encounter for preprocedural cardiovascular examination: Secondary | ICD-10-CM | POA: Insufficient documentation

## 2013-03-29 DIAGNOSIS — M9080 Osteopathy in diseases classified elsewhere, unspecified site: Secondary | ICD-10-CM | POA: Insufficient documentation

## 2013-03-29 DIAGNOSIS — J449 Chronic obstructive pulmonary disease, unspecified: Secondary | ICD-10-CM | POA: Insufficient documentation

## 2013-03-29 DIAGNOSIS — M869 Osteomyelitis, unspecified: Secondary | ICD-10-CM | POA: Insufficient documentation

## 2013-03-29 DIAGNOSIS — J4489 Other specified chronic obstructive pulmonary disease: Secondary | ICD-10-CM | POA: Insufficient documentation

## 2013-03-29 DIAGNOSIS — E1169 Type 2 diabetes mellitus with other specified complication: Principal | ICD-10-CM | POA: Insufficient documentation

## 2013-03-29 DIAGNOSIS — I10 Essential (primary) hypertension: Secondary | ICD-10-CM | POA: Insufficient documentation

## 2013-03-29 DIAGNOSIS — Z01812 Encounter for preprocedural laboratory examination: Secondary | ICD-10-CM | POA: Insufficient documentation

## 2013-03-29 DIAGNOSIS — Z7901 Long term (current) use of anticoagulants: Secondary | ICD-10-CM | POA: Insufficient documentation

## 2013-03-29 DIAGNOSIS — F79 Unspecified intellectual disabilities: Secondary | ICD-10-CM | POA: Insufficient documentation

## 2013-03-29 HISTORY — PX: AMPUTATION: SHX166

## 2013-03-29 LAB — GLUCOSE, CAPILLARY
Glucose-Capillary: 175 mg/dL — ABNORMAL HIGH (ref 70–99)
Glucose-Capillary: 149 mg/dL — ABNORMAL HIGH (ref 70–99)
Glucose-Capillary: 216 mg/dL — ABNORMAL HIGH (ref 70–99)

## 2013-03-29 SURGERY — AMPUTATION, FOOT, RAY
Anesthesia: General | Site: Foot | Laterality: Right | Wound class: Dirty or Infected

## 2013-03-29 MED ORDER — MIDAZOLAM HCL 5 MG/5ML IJ SOLN
INTRAMUSCULAR | Status: DC | PRN
  Administered 2013-03-29: 1 mg via INTRAVENOUS

## 2013-03-29 MED ORDER — ONDANSETRON HCL 4 MG/2ML IJ SOLN: 4.0000 mg | Freq: Four times a day (QID) | INTRAMUSCULAR | Status: DC | PRN

## 2013-03-29 MED ORDER — OXYCODONE-ACETAMINOPHEN 5-325 MG PO TABS: 1.0000 | ORAL_TABLET | ORAL | Status: DC | PRN

## 2013-03-29 MED ORDER — GABAPENTIN 300 MG PO CAPS
300.0000 mg | ORAL_CAPSULE | Freq: Two times a day (BID) | ORAL | Status: DC
  Administered 2013-03-29 – 2013-03-30 (×3): 300 mg via ORAL
  Filled 2013-03-29 (×4): qty 1

## 2013-03-29 MED ORDER — INSULIN GLARGINE 100 UNIT/ML ~~LOC~~ SOLN
30.0000 [IU] | Freq: Two times a day (BID) | SUBCUTANEOUS | Status: DC
  Administered 2013-03-29 – 2013-03-30 (×3): 30 [IU] via SUBCUTANEOUS
  Filled 2013-03-29 (×4): qty 0.3

## 2013-03-29 MED ORDER — METFORMIN HCL 500 MG PO TABS
1000.0000 mg | ORAL_TABLET | Freq: Two times a day (BID) | ORAL | Status: DC
  Administered 2013-03-29 – 2013-03-30 (×2): 1000 mg via ORAL
  Filled 2013-03-29 (×4): qty 2

## 2013-03-29 MED ORDER — HYDROMORPHONE HCL PF 1 MG/ML IJ SOLN: 0.2500 mg | INTRAMUSCULAR | Status: DC | PRN

## 2013-03-29 MED ORDER — PAROXETINE HCL 20 MG PO TABS
40.0000 mg | ORAL_TABLET | Freq: Every day | ORAL | Status: DC
  Administered 2013-03-30: 40 mg via ORAL
  Filled 2013-03-29: qty 2

## 2013-03-29 MED ORDER — HYDROCODONE-ACETAMINOPHEN 5-325 MG PO TABS
1.0000 | ORAL_TABLET | ORAL | Status: DC | PRN
  Administered 2013-03-29 – 2013-03-30 (×2): 2 via ORAL
  Filled 2013-03-29 (×2): qty 2

## 2013-03-29 MED ORDER — METOCLOPRAMIDE HCL 10 MG PO TABS: 5.0000 mg | ORAL_TABLET | Freq: Three times a day (TID) | ORAL | Status: DC | PRN

## 2013-03-29 MED ORDER — FUROSEMIDE 40 MG PO TABS
40.0000 mg | ORAL_TABLET | Freq: Every day | ORAL | Status: DC
  Administered 2013-03-29 – 2013-03-30 (×2): 40 mg via ORAL
  Filled 2013-03-29 (×2): qty 1

## 2013-03-29 MED ORDER — METOPROLOL TARTRATE 25 MG PO TABS
25.0000 mg | ORAL_TABLET | Freq: Two times a day (BID) | ORAL | Status: DC
  Administered 2013-03-30: 25 mg via ORAL
  Filled 2013-03-29 (×4): qty 1

## 2013-03-29 MED ORDER — HYDROMORPHONE HCL PF 1 MG/ML IJ SOLN: 0.5000 mg | INTRAMUSCULAR | Status: DC | PRN

## 2013-03-29 MED ORDER — DABIGATRAN ETEXILATE MESYLATE 150 MG PO CAPS
150.0000 mg | ORAL_CAPSULE | Freq: Two times a day (BID) | ORAL | Status: DC
  Administered 2013-03-29 – 2013-03-30 (×3): 150 mg via ORAL
  Filled 2013-03-29 (×4): qty 1

## 2013-03-29 MED ORDER — LACTATED RINGERS IV SOLN
INTRAVENOUS | Status: DC | PRN
  Administered 2013-03-29 (×2): via INTRAVENOUS

## 2013-03-29 MED ORDER — ATORVASTATIN CALCIUM 40 MG PO TABS
40.0000 mg | ORAL_TABLET | Freq: Every day | ORAL | Status: DC
  Administered 2013-03-29 – 2013-03-30 (×2): 40 mg via ORAL
  Filled 2013-03-29 (×2): qty 1

## 2013-03-29 MED ORDER — FENTANYL CITRATE 0.05 MG/ML IJ SOLN
INTRAMUSCULAR | Status: DC | PRN
  Administered 2013-03-29: 100 ug via INTRAVENOUS

## 2013-03-29 MED ORDER — ONDANSETRON HCL 4 MG PO TABS: 4.0000 mg | ORAL_TABLET | Freq: Four times a day (QID) | ORAL | Status: DC | PRN

## 2013-03-29 MED ORDER — METOCLOPRAMIDE HCL 5 MG/ML IJ SOLN: 5.0000 mg | Freq: Three times a day (TID) | INTRAMUSCULAR | Status: DC | PRN

## 2013-03-29 MED ORDER — PAROXETINE HCL 20 MG PO TABS
40.0000 mg | ORAL_TABLET | ORAL | Status: DC
  Filled 2013-03-29: qty 2

## 2013-03-29 MED ORDER — DILTIAZEM HCL ER COATED BEADS 240 MG PO CP24
240.0000 mg | ORAL_CAPSULE | Freq: Every day | ORAL | Status: DC
  Administered 2013-03-30: 240 mg via ORAL
  Filled 2013-03-29 (×2): qty 1

## 2013-03-29 MED ORDER — SODIUM CHLORIDE 0.9 % IV SOLN: INTRAVENOUS | Status: DC

## 2013-03-29 MED ORDER — PROPOFOL 10 MG/ML IV BOLUS
INTRAVENOUS | Status: DC | PRN
  Administered 2013-03-29: 125 mg via INTRAVENOUS

## 2013-03-29 MED ORDER — INSULIN ASPART 100 UNIT/ML ~~LOC~~ SOLN
4.0000 [IU] | Freq: Three times a day (TID) | SUBCUTANEOUS | Status: DC
  Administered 2013-03-29 – 2013-03-30 (×2): 4 [IU] via SUBCUTANEOUS

## 2013-03-29 MED ORDER — LIDOCAINE HCL (CARDIAC) 20 MG/ML IV SOLN
INTRAVENOUS | Status: DC | PRN
  Administered 2013-03-29: 100 mg via INTRAVENOUS

## 2013-03-29 MED ORDER — PANTOPRAZOLE SODIUM 40 MG PO TBEC
40.0000 mg | DELAYED_RELEASE_TABLET | Freq: Every day | ORAL | Status: DC
  Administered 2013-03-29 – 2013-03-30 (×2): 40 mg via ORAL
  Filled 2013-03-29 (×2): qty 1

## 2013-03-29 MED ORDER — INSULIN GLARGINE 100 UNIT/ML SOLOSTAR PEN: 30.0000 [IU] | PEN_INJECTOR | Freq: Two times a day (BID) | SUBCUTANEOUS | Status: DC

## 2013-03-29 MED ORDER — LACTATED RINGERS IV SOLN
INTRAVENOUS | Status: DC
  Administered 2013-03-29: 11:00:00 via INTRAVENOUS

## 2013-03-29 MED ORDER — INSULIN ASPART 100 UNIT/ML ~~LOC~~ SOLN
0.0000 [IU] | Freq: Three times a day (TID) | SUBCUTANEOUS | Status: DC
  Administered 2013-03-29 – 2013-03-30 (×2): 5 [IU] via SUBCUTANEOUS

## 2013-03-29 MED ORDER — 0.9 % SODIUM CHLORIDE (POUR BTL) OPTIME
TOPICAL | Status: DC | PRN
  Administered 2013-03-29: 1000 mL

## 2013-03-29 MED ORDER — ONDANSETRON HCL 4 MG/2ML IJ SOLN
INTRAMUSCULAR | Status: DC | PRN
  Administered 2013-03-29: 4 mg via INTRAVENOUS

## 2013-03-29 MED ORDER — ASPIRIN EC 81 MG PO TBEC
81.0000 mg | DELAYED_RELEASE_TABLET | Freq: Every day | ORAL | Status: DC
  Administered 2013-03-29 – 2013-03-30 (×2): 81 mg via ORAL
  Filled 2013-03-29 (×2): qty 1

## 2013-03-29 MED ORDER — CLINDAMYCIN PHOSPHATE 600 MG/50ML IV SOLN
600.0000 mg | Freq: Four times a day (QID) | INTRAVENOUS | Status: AC
  Administered 2013-03-29 – 2013-03-30 (×3): 600 mg via INTRAVENOUS
  Filled 2013-03-29 (×3): qty 50

## 2013-03-29 SURGICAL SUPPLY — 41 items
BANDAGE ESMARK 6X9 LF (GAUZE/BANDAGES/DRESSINGS) IMPLANT
BANDAGE GAUZE ELAST BULKY 4 IN (GAUZE/BANDAGES/DRESSINGS) ×1 IMPLANT
BLADE SAW SGTL MED 73X18.5 STR (BLADE) IMPLANT
BNDG CMPR 9X6 STRL LF SNTH (GAUZE/BANDAGES/DRESSINGS)
BNDG COHESIVE 4X5 TAN STRL (GAUZE/BANDAGES/DRESSINGS) ×2 IMPLANT
BNDG COHESIVE 6X5 TAN STRL LF (GAUZE/BANDAGES/DRESSINGS) ×2 IMPLANT
BNDG ESMARK 6X9 LF (GAUZE/BANDAGES/DRESSINGS)
CLOTH BEACON ORANGE TIMEOUT ST (SAFETY) ×2 IMPLANT
CUFF TOURNIQUET SINGLE 34IN LL (TOURNIQUET CUFF) IMPLANT
CUFF TOURNIQUET SINGLE 44IN (TOURNIQUET CUFF) IMPLANT
DRAPE U-SHAPE 47X51 STRL (DRAPES) ×4 IMPLANT
DRSG ADAPTIC 3X8 NADH LF (GAUZE/BANDAGES/DRESSINGS) ×2 IMPLANT
DRSG PAD ABDOMINAL 8X10 ST (GAUZE/BANDAGES/DRESSINGS) ×1 IMPLANT
DURAPREP 26ML APPLICATOR (WOUND CARE) ×2 IMPLANT
ELECT REM PT RETURN 9FT ADLT (ELECTROSURGICAL) ×2
ELECTRODE REM PT RTRN 9FT ADLT (ELECTROSURGICAL) ×1 IMPLANT
GLOVE BIOGEL PI IND STRL 9 (GLOVE) ×1 IMPLANT
GLOVE BIOGEL PI INDICATOR 9 (GLOVE) ×1
GLOVE SURG ORTHO 9.0 STRL STRW (GLOVE) ×2 IMPLANT
GOWN PREVENTION PLUS XLARGE (GOWN DISPOSABLE) ×2 IMPLANT
GOWN SRG XL XLNG 56XLVL 4 (GOWN DISPOSABLE) ×1 IMPLANT
GOWN STRL NON-REIN XL XLG LVL4 (GOWN DISPOSABLE) ×2
KIT BASIN OR (CUSTOM PROCEDURE TRAY) ×2 IMPLANT
KIT ROOM TURNOVER OR (KITS) ×2 IMPLANT
MANIFOLD NEPTUNE II (INSTRUMENTS) ×2 IMPLANT
NS IRRIG 1000ML POUR BTL (IV SOLUTION) ×2 IMPLANT
PACK ORTHO EXTREMITY (CUSTOM PROCEDURE TRAY) ×2 IMPLANT
PAD ARMBOARD 7.5X6 YLW CONV (MISCELLANEOUS) ×4 IMPLANT
PAD CAST 4YDX4 CTTN HI CHSV (CAST SUPPLIES) ×1 IMPLANT
PADDING CAST COTTON 4X4 STRL (CAST SUPPLIES) ×2
SPONGE GAUZE 4X4 12PLY (GAUZE/BANDAGES/DRESSINGS) ×2 IMPLANT
SPONGE LAP 18X18 X RAY DECT (DISPOSABLE) ×2 IMPLANT
STAPLER VISISTAT 35W (STAPLE) ×2 IMPLANT
STOCKINETTE IMPERVIOUS LG (DRAPES) IMPLANT
SUCTION FRAZIER TIP 10 FR DISP (SUCTIONS) ×1 IMPLANT
SUT ETHILON 2 0 PSLX (SUTURE) ×4 IMPLANT
TOWEL OR 17X24 6PK STRL BLUE (TOWEL DISPOSABLE) ×2 IMPLANT
TOWEL OR 17X26 10 PK STRL BLUE (TOWEL DISPOSABLE) ×2 IMPLANT
TUBE CONNECTING 12X1/4 (SUCTIONS) ×2 IMPLANT
UNDERPAD 30X30 INCONTINENT (UNDERPADS AND DIAPERS) ×2 IMPLANT
WATER STERILE IRR 1000ML POUR (IV SOLUTION) ×1 IMPLANT

## 2013-03-29 NOTE — Preoperative (Signed)
Beta Blockers   Reason not to administer Beta Blockers:Not Applicable 

## 2013-03-29 NOTE — Transfer of Care (Signed)
Immediate Anesthesia Transfer of Care Note  Patient: [name]  Procedure(s) Performed: Procedure(s) with comments: AMPUTATION RAY (Right) - Right Foot 3rd and Possible 4th Ray Amputation  Patient Location: PACU  Anesthesia Type:General  Level of Consciousness: awake, alert  and oriented  Airway & Oxygen Therapy: Patient Spontanous Breathing and Patient connected to nasal cannula oxygen  Post-op Assessment: Report given to PACU RN, Post -op Vital signs reviewed and stable and Patient moving all extremities X 4  Post vital signs: Reviewed and stable  Complications: No apparent anesthesia complications

## 2013-03-29 NOTE — Anesthesia Postprocedure Evaluation (Signed)
  Anesthesia Post-op Note  Patient: [name]  Procedure(s) Performed: Procedure(s) with comments: AMPUTATION RAY (Right) - Right Foot 3rd and Possible 4th Ray Amputation  Patient Location: PACU  Anesthesia Type:General  Level of Consciousness: awake and alert   Airway and Oxygen Therapy: Patient Spontanous Breathing and Patient connected to nasal cannula oxygen  Post-op Pain: none  Post-op Assessment: Post-op Vital signs reviewed, Patient's Cardiovascular Status Stable, Respiratory Function Stable, Patent Airway and No signs of Nausea or vomiting  Post-op Vital Signs: Reviewed and stable  Complications: No apparent anesthesia complications

## 2013-03-29 NOTE — Anesthesia Procedure Notes (Signed)
Procedure Name: LMA Insertion Date/Time: 03/29/2013 12:59 PM Performed by: Carmela Rima Pre-anesthesia Checklist: Patient identified, Timeout performed, Emergency Drugs available, Suction available and Patient being monitored Patient Re-evaluated:Patient Re-evaluated prior to inductionOxygen Delivery Method: Circle system utilized Preoxygenation: Pre-oxygenation with 100% oxygen Intubation Type: IV induction Ventilation: Mask ventilation without difficulty LMA: LMA with gastric port inserted LMA Size: 4.0 Number of attempts: 2 Placement Confirmation: positive ETCO2 and breath sounds checked- equal and bilateral Tube secured with: Tape Dental Injury: Teeth and Oropharynx as per pre-operative assessment

## 2013-03-29 NOTE — Op Note (Signed)
OPERATIVE REPORT  DATE OF SURGERY: 03/29/2013  PATIENT:  [name],  57 y.o. male  PRE-OPERATIVE DIAGNOSIS:  Osteomyelitis and Abscess Right Foot  POST-OPERATIVE DIAGNOSIS:  Osteomyelitis and Abscess Right Foot  PROCEDURE:  Procedure(s): AMPUTATION RAY right foot third ray  SURGEON:  Surgeon(s): Nadara Mustard, MD  ANESTHESIA:   general  EBL:  Minimal ML  SPECIMEN:  No Specimen  TOURNIQUET:  * No tourniquets in log *  PROCEDURE DETAILS: Patient is a 57 year old gentleman with osteomyelitis abscess ulceration right foot third metatarsal. Patient is failed conservative treatment and presents at this time for surgical intervention. Risks and benefits were discussed including persistent infection neurovascular injury nonhealing of the surgical incision need for additional surgery. Patient states he understands and wished to proceed at this time. Description of procedure patient brought to the operating room and underwent a general anesthetic. After adequate levels and anesthesia obtained patient's right lower extremity was prepped using DuraPrep and draped into a sterile field. A V. incision was made plantarly and dorsally over the third ray to incorporate the plantar ulcer. The metatarsal and toe were resected in one block of tissue including the ulcerative tissue. The necrotic infected tissue deep to the skin was debrided and removed. This is debrided back to healthy viable granulation tissue. The wound is irrigated with normal saline. The incision was closed in 2-0 nylon. The wound was covered with Adaptic orthopedic sponges AB dressing Kerlix and Coban. Patient was extubated taken to the PACU in stable condition.  PLAN OF CARE: Admit for overnight observation  PATIENT DISPOSITION:  PACU - hemodynamically stable.   Nadara Mustard, MD 03/29/2013 1:11 PM

## 2013-03-29 NOTE — Anesthesia Preprocedure Evaluation (Addendum)
Anesthesia Evaluation  Patient identified by MRN, date of birth, ID band Patient awake    Reviewed: Allergy & Precautions, H&P , NPO status , reviewed documented beta blocker date and time   Airway Mallampati: II      Dental  (+) Dental Advidsory Given and Poor Dentition   Pulmonary shortness of breath, pneumonia -, COPD breath sounds clear to auscultation        Cardiovascular hypertension, + Peripheral Vascular Disease + dysrhythmias Rhythm:Regular Rate:Normal     Neuro/Psych CVA    GI/Hepatic Neg liver ROS, GERD-  ,  Endo/Other  diabetes  Renal/GU      Musculoskeletal   Abdominal   Peds  Hematology negative hematology ROS (+)   Anesthesia Other Findings   Reproductive/Obstetrics                       Anesthesia Physical Anesthesia Plan  ASA: III  Anesthesia Plan: General   Post-op Pain Management:    Induction: Intravenous  Airway Management Planned: LMA  Additional Equipment:   Intra-op Plan:   Post-operative Plan: Extubation in OR  Informed Consent: I have reviewed the patients History and Physical, chart, labs and discussed the procedure including the risks, benefits and alternatives for the proposed anesthesia with the patient or authorized representative who has indicated his/her understanding and acceptance.   Dental advisory given and Dental Advisory Given  Plan Discussed with: CRNA, Anesthesiologist and Surgeon  Anesthesia Plan Comments:      Anesthesia Quick Evaluation

## 2013-03-29 NOTE — H&P (Signed)
[name] is an 57 y.o. male.   Chief Complaint: Ulceration osteomyelitis right foot third and fourth toes HPI: Patient is a 57 year old gentleman with osteomyelitis abscess of the third toe with cellulitis involving the fourth toe. Patient is undergone prolonged conservative therapy and presents at this time for surgical intervention.  Past Medical History  Diagnosis Date  . Hypertension   . Diabetes mellitus   . Mental retardation     Sister helps to take care of him and takes him to appts  . Tobacco user     Smokes 1ppd for multiple years.  Quit after hosp 09/2010.  Marland Kitchen Anemia     History of Iron Def Anemia  . Anxiety   . Hyperlipidemia   . Atrial fibrillation 06/2009    CHADS score 2 (HTN, DM), was not on coumadin, but now on Pradaxa for Afib  . Lung nodule   . Chronic low back pain   . CVA (cerebral infarction) 09/2010    Bilateral with Left > Right  . Abdominal hernia     Chronic, not a good surgical candidate  . Carotid artery occlusion   . Obesity   . COPD (chronic obstructive pulmonary disease)   . Abscess, abdomen 12/31/2010    Referred to Wound Care in 01/2011 because of multiple abd abscess with VERY large ventral hernia (please look at image of CT abd/pelvis 09/2010).  Because of hernia I was hesitant to I&D.      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 Stroke     right hand numbness  . GERD (gastroesophageal reflux disease)   . Pneumonia     hx of  . Shortness of breath     with ambulation  . Edema of both legs     takes Lasix  . Numbness and tingling in hands   . Itching     all over body; pt scratches and has sores on bilateral arms and abdomen    Past Surgical History  Procedure Laterality Date  . Carotid arteriogram  10/2010    30% right ICA stenosis, 40% left ICA stenosis   . Transesophageal echocardiogram  09/2010    No ASD or PFO. EF 60-65%.  Normal systolic function. No evidence of thrombus.   . Transthoracic  echocardiogram  09/2010     The cavity size was normal. Systolic function was vigorous.  EF 65-70%.  Normal wall funciton.   . Debridment of decubitus ulcer Right 02/13/2013  . I&d extremity Right 02/13/2013    Procedure: IRRIGATION AND DEBRIDEMENT FOOT ULCER;  Surgeon: Eulas Post, MD;  Location: [facility] OR;  Service: Orthopedics;  Laterality: Right;  PULSE LAVAGE  . Eye surgery    . Cataract extraction, bilateral    . Arm surgery Left     as a child    Family History  Problem Relation Age of Onset  . Heart disease Mother   . Hypertension Sister   . Heart disease Sister   . Heart disease Brother   . Heart disease Father   . Peripheral vascular disease Father   . Heart disease Maternal Grandmother   . Heart attack Maternal Grandmother    Social History:  reports that he quit smoking about 2 years ago. His smoking use included Cigarettes. He has a 42 pack-year smoking history. He quit smokeless tobacco use about 2 years ago. He reports that he does not drink alcohol or use illicit drugs.  Allergies:  Allergies  Allergen Reactions  . Penicillins Hives and Nausea And Vomiting    No prescriptions prior to admission    Results for orders placed during the hospital encounter of 03/28/13 (from the past 48 hour(s))  APTT     Status: Abnormal   Collection Time    03/28/13 10:26 AM      Result Value Range   aPTT 52 (*) 24 - 37 seconds   Comment:            IF BASELINE aPTT IS ELEVATED,     SUGGEST PATIENT RISK ASSESSMENT     BE USED TO DETERMINE APPROPRIATE     ANTICOAGULANT THERAPY.  CBC     Status: Abnormal   Collection Time    03/28/13 10:26 AM      Result Value Range   WBC 10.0  4.0 - 10.5 K/uL   RBC 4.47  4.22 - 5.81 MIL/uL   Hemoglobin 10.4 (*) 13.0 - 17.0 g/dL   HCT 29.5 (*) 62.1 - 30.8 %   MCV 72.7 (*) 78.0 - 100.0 fL   MCH 23.3 (*) 26.0 - 34.0 pg   MCHC 32.0  30.0 - 36.0 g/dL   RDW 65.7  84.6 - 96.2 %   Platelets 296  150 - 400 K/uL  COMPREHENSIVE METABOLIC PANEL      Status: Abnormal   Collection Time    03/28/13 10:26 AM      Result Value Range   Sodium 135  135 - 145 mEq/L   Potassium 5.0  3.5 - 5.1 mEq/L   Chloride 99  96 - 112 mEq/L   CO2 25  19 - 32 mEq/L   Glucose, Bld 189 (*) 70 - 99 mg/dL   BUN 22  6 - 23 mg/dL   Creatinine, Ser 9.52  0.50 - 1.35 mg/dL   Calcium 9.4  8.4 - 84.1 mg/dL   Total Protein 7.4  6.0 - 8.3 g/dL   Albumin 3.0 (*) 3.5 - 5.2 g/dL   AST 14  0 - 37 U/L   ALT 11  0 - 53 U/L   Alkaline Phosphatase 78  39 - 117 U/L   Total Bilirubin 0.2 (*) 0.3 - 1.2 mg/dL   GFR calc non Af Amer 74 (*) >90 mL/min   GFR calc Af Amer 86 (*) >90 mL/min   Comment: (NOTE)     The eGFR has been calculated using the CKD EPI equation.     This calculation has not been validated in all clinical situations.     eGFR's persistently <90 mL/min signify possible Chronic Kidney     Disease.  PROTIME-INR     Status: Abnormal   Collection Time    03/28/13 10:26 AM      Result Value Range   Prothrombin Time 15.9 (*) 11.6 - 15.2 seconds   INR 1.30  0.00 - 1.49   No results found.  Review of Systems  All other systems reviewed and are negative.    There were no vitals taken for this visit. Physical Exam  On examination patient has palpable pulses there is ulceration abscess of the third toe with cellulitis of the fourth toe.  Assessment/Plan Assessment: Ulceration abscess osteomyelitis right foot third toe with cellulitis of the fourth toe.  Plan: Will plan for a third ray amputation. May require a fourth ray amputation depending on the extent of the soft tissue involvement which may involve the fourth metatarsal as well. Will plan for  a  third and possible fourth ray amputation.  [name] V 03/29/2013, 6:30 AM

## 2013-03-30 MED ORDER — HYDROCODONE-ACETAMINOPHEN 5-325 MG PO TABS: 1.0000 | ORAL_TABLET | Freq: Four times a day (QID) | ORAL | Status: DC | PRN

## 2013-03-30 NOTE — Evaluation (Signed)
Physical Therapy Evaluation Patient Details Name: [name] MRN: [id number] DOB: 07-Apr-1956 Today's Date: 03/30/2013 Time: 1020-1028 PT Time Calculation (min): 8 min  PT Assessment / Plan / Recommendation History of Present Illness  Patient is a 57 year old gentleman with osteomyelitis abscess of the third toe with cellulitis involving the fourth toe.  Patient underwent AMPUTATION RAY right foot third ray   Clinical Impression  Patient is s/p amputation ray, third ray, right foot surgery resulting in functional limitations due to the deficits listed below (see PT Problem List). Patient did well with bed mobility and bed to chair transfer maintaining decreased weight bearing.  Felt patient would do better with ambulation with darco shoe and asked ortho tech to deliver one.  Unfortunately, patient discharged before I was able to return to fully assess gait.  Recommend HHPT to follow up to ensure proper progression of ambulation.    PT Assessment  Patient needs continued PT services    Follow Up Recommendations  Home health PT    Does the patient have the potential to tolerate intense rehabilitation      Barriers to Discharge        Equipment Recommendations  Rolling walker with 5" wheels    Recommendations for Other Services     Frequency      Precautions / Restrictions Precautions Precautions: Fall   Pertinent Vitals/Pain Patient did not complain of any pain.      Mobility  Bed Mobility Bed Mobility: Supine to Sit Supine to Sit: 5: Supervision Transfers Transfers: Sit to Stand;Stand to Sit Sit to Stand: 4: Min assist;With upper extremity assist;From bed Stand to Sit: 4: Min assist;With upper extremity assist;To chair/3-in-1 Ambulation/Gait Ambulation/Gait Assistance: 4: Min assist Ambulation Distance (Feet): 5 Feet Assistive device: Rolling walker Ambulation/Gait Assistance Details: patient only really ambulated bed to chair, NWB right.  Patient would do better  with a darco shoe - asked Orthopedic services to bring one to patient.      Exercises     PT Diagnosis: Difficulty walking  PT Problem List: Decreased activity tolerance;Decreased knowledge of use of DME;Decreased knowledge of precautions PT Treatment Interventions:       PT Goals(Current goals can be found in the care plan section)    Visit Information  Last PT Received On: 03/30/13 Assistance Needed: +1 History of Present Illness: Patient is a 57 year old gentleman with osteomyelitis abscess of the third toe with cellulitis involving the fourth toe.  Patient underwent AMPUTATION RAY right foot third ray        Prior Functioning  Home Living Family/patient expects to be discharged to:: Private residence Living Arrangements: Other relatives Available Help at Discharge: Family;Available PRN/intermittently Type of Home: House Home Access: Stairs to enter [employer] of Steps: 3 Entrance Stairs-Rails: None Home Layout: One level Home Equipment: Crutches Additional Comments: lives with sister and her boyfriend Prior Function Comments: uncertain - difficult to get information from patient Communication Communication: No difficulties    Cognition  Cognition Arousal/Alertness: Awake/alert Behavior During Therapy: WFL for tasks assessed/performed Overall Cognitive Status: No family/caregiver present to determine baseline cognitive functioning    Extremity/Trunk Assessment Upper Extremity Assessment Upper Extremity Assessment: Generalized weakness Lower Extremity Assessment Lower Extremity Assessment: Generalized weakness Cervical / Trunk Assessment Cervical / Trunk Assessment: Normal   Balance Balance Balance Assessed:  (no obvious balance deficits noted, not tested)  End of Session PT - End of Session Equipment Utilized During Treatment: Gait belt Activity Tolerance: Patient tolerated treatment well Patient left: in chair;with  call bell/phone within reach   GP Functional Assessment Tool Used: clinical judgement Functional Limitation: Mobility: Walking and moving around Mobility: Walking and Moving Around Current Status [phone]): At least 20 percent but less than 40 percent impaired, limited or restricted Mobility: Walking and Moving Around Goal Status [phone]): At least 20 percent but less than 40 percent impaired, limited or restricted Mobility: Walking and Moving Around Discharge Status [phone]): At least 20 percent but less than 40 percent impaired, limited or restricted   [name], [place] [phone] 03/30/2013, 2:11 PM

## 2013-03-30 NOTE — Progress Notes (Signed)
Orthopedic Tech Progress Note Patient Details:  [name] Aug 10, 1955 [id number]  Ortho Devices Type of Ortho Device: Darco shoe Ortho Device/Splint Interventions: Application   Shawnie Pons 03/30/2013, 12:05 PM

## 2013-03-30 NOTE — Progress Notes (Signed)
Utilization review completed.  

## 2013-03-30 NOTE — Discharge Summary (Signed)
Physician Discharge Summary  Patient ID: [name] MRN: [id number] DOB/AGE: 1956-03-09 57 y.o.  Admit date: 03/29/2013 Discharge date: 03/30/2013  Admission Diagnoses: Osteomyelitis right foot third metatarsal  Discharge Diagnoses: Osteomyelitis right foot third metatarsal Active Problems:   * No active hospital problems. *   Discharged Condition: stable  Hospital Course: Patient's hospital course is essentially remarkable. He underwent third ray amputation and was discharged to home after observation.  Consults: None  Significant Diagnostic Studies: labs: Routine labs  Treatments: surgery: See operative note  Discharge Exam: Blood pressure 117/65, pulse 70, temperature 98.6 F (37 C), temperature source Oral, resp. rate 16, SpO2 99.00%. Incision/Wound: dressing clean dry and intact  Disposition: 01-Home or Self Care  Discharge Orders   Future Appointments Provider Department Dept Phone   08/29/2013 11:00 AM [facility] [phone]   08/29/2013 12:00 PM Carma Lair Nickel, NP [facility] [phone]   Future Orders Complete By Expires   Call MD / Call 911  As directed    Comments:     If you experience chest pain or shortness of breath, CALL 911 and be transported to the hospital emergency room.  If you develope a fever above 101 F, pus (white drainage) or increased drainage or redness at the wound, or calf pain, call your surgeon's office.   Constipation Prevention  As directed    Comments:     Drink plenty of fluids.  Prune juice may be helpful.  You may use a stool softener, such as Colace (over the counter) 100 mg twice a day.  Use MiraLax (over the counter) for constipation as needed.   Diet - low sodium heart healthy  As directed    Increase activity slowly as tolerated  As directed        Medication List         aspirin 81 MG EC tablet  Take 1 tablet (81 mg total) by mouth daily.      atorvastatin 40 MG tablet  Commonly known as:  LIPITOR  Take 40 mg by mouth daily.     dabigatran 150 MG Caps capsule  Commonly known as:  PRADAXA  Take 150 mg by mouth every 12 (twelve) hours.     diltiazem 240 MG 24 hr capsule  Commonly known as:  CARDIZEM CD  Take 240 mg by mouth daily.     furosemide 40 MG tablet  Commonly known as:  LASIX  Take 1 tablet (40 mg total) by mouth daily.     gabapentin 300 MG capsule  Commonly known as:  NEURONTIN  Take 300 mg by mouth 2 (two) times daily.     glucose blood test strip  Use as instructed     HYDROcodone-acetaminophen 5-325 MG per tablet  Commonly known as:  NORCO  Take 1 tablet by mouth every 6 (six) hours as needed for pain.     HYDROcodone-acetaminophen 10-325 MG per tablet  Commonly known as:  NORCO  Take 1-2 tablets by mouth every 4 (four) hours as needed for pain.     hydrocortisone 2.5 % ointment  Apply topically 3 (three) times daily. To leg around wound. Do not apply to broken skin.     LANTUS SOLOSTAR 100 UNIT/ML Sopn  Generic drug:  Insulin Glargine  Inject 30 Units into the skin 2 (two) times daily.     metFORMIN 1000 MG tablet  Commonly known as:  GLUCOPHAGE  Take 1,000 mg by  mouth 2 (two) times daily with a meal.     metoprolol tartrate 25 MG tablet  Commonly known as:  LOPRESSOR  Take 25 mg by mouth 2 (two) times daily.     nitroGLYCERIN 0.4 MG SL tablet  Commonly known as:  NITROSTAT  Place 0.4 mg under the tongue every 5 (five) minutes as needed for chest pain.     omeprazole 20 MG capsule  Commonly known as:  PRILOSEC  Take 20 mg by mouth daily.     onetouch ultrasoft lancets  Use as instructed     PARoxetine 40 MG tablet  Commonly known as:  PAXIL  Take 40 mg by mouth every morning.           Follow-up Information   Follow up with DUDA,MARCUS V, MD In 2 weeks.   Specialty:  Orthopedic Surgery   Contact information:   [address] [place] [address] [phone]        Signed: Nadara Mustard 03/30/2013, 7:52 AM

## 2013-04-02 ENCOUNTER — Encounter (HOSPITAL_COMMUNITY): Payer: Self-pay | Admitting: Orthopedic Surgery

## 2013-04-23 ENCOUNTER — Encounter (HOSPITAL_COMMUNITY): Payer: Self-pay | Admitting: Certified Registered"

## 2013-04-23 ENCOUNTER — Encounter (HOSPITAL_COMMUNITY)
Admission: AD | Disposition: A | Discharge status: Home / Self Care | Payer: Self-pay | Source: Ambulatory Visit | Attending: Orthopedic Surgery

## 2013-04-23 ENCOUNTER — Encounter (HOSPITAL_COMMUNITY): Payer: Self-pay | Admitting: *Deleted

## 2013-04-23 ENCOUNTER — Inpatient Hospital Stay (HOSPITAL_COMMUNITY)
Admission: AD | Admit: 2013-04-23 | Discharge: 2013-04-25 | DRG: 617 | Disposition: A | Discharge status: Home / Self Care | Payer: Medicare (Managed Care) | Source: Ambulatory Visit | Attending: Orthopedic Surgery | Admitting: Orthopedic Surgery

## 2013-04-23 ENCOUNTER — Ambulatory Visit (HOSPITAL_COMMUNITY): Payer: Medicare (Managed Care) | Admitting: Certified Registered"

## 2013-04-23 DIAGNOSIS — Z01812 Encounter for preprocedural laboratory examination: Secondary | ICD-10-CM

## 2013-04-23 DIAGNOSIS — E1165 Type 2 diabetes mellitus with hyperglycemia: Secondary | ICD-10-CM

## 2013-04-23 DIAGNOSIS — L03039 Cellulitis of unspecified toe: Secondary | ICD-10-CM | POA: Diagnosis present

## 2013-04-23 DIAGNOSIS — E785 Hyperlipidemia, unspecified: Secondary | ICD-10-CM | POA: Diagnosis present

## 2013-04-23 DIAGNOSIS — L97509 Non-pressure chronic ulcer of other part of unspecified foot with unspecified severity: Secondary | ICD-10-CM | POA: Diagnosis present

## 2013-04-23 DIAGNOSIS — Z8673 Personal history of transient ischemic attack (TIA), and cerebral infarction without residual deficits: Secondary | ICD-10-CM

## 2013-04-23 DIAGNOSIS — F79 Unspecified intellectual disabilities: Secondary | ICD-10-CM | POA: Diagnosis present

## 2013-04-23 DIAGNOSIS — M9080 Osteopathy in diseases classified elsewhere, unspecified site: Secondary | ICD-10-CM | POA: Diagnosis present

## 2013-04-23 DIAGNOSIS — L02419 Cutaneous abscess of limb, unspecified: Secondary | ICD-10-CM

## 2013-04-23 DIAGNOSIS — F17200 Nicotine dependence, unspecified, uncomplicated: Secondary | ICD-10-CM | POA: Diagnosis present

## 2013-04-23 DIAGNOSIS — M869 Osteomyelitis, unspecified: Secondary | ICD-10-CM | POA: Diagnosis present

## 2013-04-23 DIAGNOSIS — J449 Chronic obstructive pulmonary disease, unspecified: Secondary | ICD-10-CM | POA: Diagnosis present

## 2013-04-23 DIAGNOSIS — L02619 Cutaneous abscess of unspecified foot: Secondary | ICD-10-CM | POA: Diagnosis present

## 2013-04-23 DIAGNOSIS — E1069 Type 1 diabetes mellitus with other specified complication: Principal | ICD-10-CM | POA: Diagnosis present

## 2013-04-23 DIAGNOSIS — L299 Pruritus, unspecified: Secondary | ICD-10-CM | POA: Diagnosis present

## 2013-04-23 DIAGNOSIS — J4489 Other specified chronic obstructive pulmonary disease: Secondary | ICD-10-CM | POA: Diagnosis present

## 2013-04-23 DIAGNOSIS — I4891 Unspecified atrial fibrillation: Secondary | ICD-10-CM | POA: Diagnosis present

## 2013-04-23 DIAGNOSIS — K219 Gastro-esophageal reflux disease without esophagitis: Secondary | ICD-10-CM | POA: Diagnosis present

## 2013-04-23 DIAGNOSIS — I10 Essential (primary) hypertension: Secondary | ICD-10-CM | POA: Diagnosis present

## 2013-04-23 DIAGNOSIS — F411 Generalized anxiety disorder: Secondary | ICD-10-CM | POA: Diagnosis present

## 2013-04-23 DIAGNOSIS — S91301D Unspecified open wound, right foot, subsequent encounter: Secondary | ICD-10-CM

## 2013-04-23 HISTORY — PX: AMPUTATION: SHX166

## 2013-04-23 LAB — CBC
HCT: 28.6 % — ABNORMAL LOW (ref 39.0–52.0)
MCV: 71.9 fL — ABNORMAL LOW (ref 78.0–100.0)
RBC: 3.98 MIL/uL — ABNORMAL LOW (ref 4.22–5.81)
RDW: 15.6 % — ABNORMAL HIGH (ref 11.5–15.5)
WBC: 8.8 10*3/uL (ref 4.0–10.5)

## 2013-04-23 LAB — GLUCOSE, CAPILLARY
Glucose-Capillary: 143 mg/dL — ABNORMAL HIGH (ref 70–99)
Glucose-Capillary: 141 mg/dL — ABNORMAL HIGH (ref 70–99)
Glucose-Capillary: 134 mg/dL — ABNORMAL HIGH (ref 70–99)

## 2013-04-23 LAB — BASIC METABOLIC PANEL
CO2: 18 mEq/L — ABNORMAL LOW (ref 19–32)
Chloride: 96 mEq/L (ref 96–112)
Creatinine, Ser: 0.88 mg/dL (ref 0.50–1.35)

## 2013-04-23 SURGERY — AMPUTATION, FOOT, PARTIAL
Anesthesia: General | Site: Foot | Laterality: Right | Wound class: Dirty or Infected

## 2013-04-23 MED ORDER — GABAPENTIN 300 MG PO CAPS
300.0000 mg | ORAL_CAPSULE | Freq: Two times a day (BID) | ORAL | Status: DC
  Administered 2013-04-23 – 2013-04-25 (×4): 300 mg via ORAL
  Filled 2013-04-23 (×5): qty 1

## 2013-04-23 MED ORDER — DILTIAZEM HCL ER COATED BEADS 240 MG PO CP24
240.0000 mg | ORAL_CAPSULE | Freq: Every day | ORAL | Status: DC
  Administered 2013-04-24 – 2013-04-25 (×2): 240 mg via ORAL
  Filled 2013-04-23 (×2): qty 1

## 2013-04-23 MED ORDER — DABIGATRAN ETEXILATE MESYLATE 150 MG PO CAPS
150.0000 mg | ORAL_CAPSULE | Freq: Two times a day (BID) | ORAL | Status: DC
  Filled 2013-04-23: qty 1

## 2013-04-23 MED ORDER — INSULIN ASPART 100 UNIT/ML ~~LOC~~ SOLN
0.0000 [IU] | Freq: Three times a day (TID) | SUBCUTANEOUS | Status: DC
  Administered 2013-04-24: 3 [IU] via SUBCUTANEOUS
  Administered 2013-04-24: 2 [IU] via SUBCUTANEOUS
  Administered 2013-04-24 – 2013-04-25 (×2): 3 [IU] via SUBCUTANEOUS

## 2013-04-23 MED ORDER — METOPROLOL TARTRATE 25 MG PO TABS
25.0000 mg | ORAL_TABLET | Freq: Two times a day (BID) | ORAL | Status: DC
  Administered 2013-04-23 – 2013-04-25 (×4): 25 mg via ORAL
  Filled 2013-04-23 (×5): qty 1

## 2013-04-23 MED ORDER — CEFAZOLIN SODIUM-DEXTROSE 2-3 GM-% IV SOLR
INTRAVENOUS | Status: DC | PRN
  Administered 2013-04-23: 2 g via INTRAVENOUS

## 2013-04-23 MED ORDER — FENTANYL CITRATE 0.05 MG/ML IJ SOLN
INTRAMUSCULAR | Status: DC | PRN
  Administered 2013-04-23: 50 ug via INTRAVENOUS

## 2013-04-23 MED ORDER — HYDROCODONE-ACETAMINOPHEN 5-325 MG PO TABS
1.0000 | ORAL_TABLET | ORAL | Status: DC | PRN
  Administered 2013-04-23 – 2013-04-24 (×2): 1 via ORAL
  Administered 2013-04-24 (×2): 2 via ORAL
  Administered 2013-04-25: 1 via ORAL
  Filled 2013-04-23: qty 2
  Filled 2013-04-23 (×2): qty 1
  Filled 2013-04-23 (×2): qty 2

## 2013-04-23 MED ORDER — OXYCODONE-ACETAMINOPHEN 5-325 MG PO TABS: 1.0000 | ORAL_TABLET | ORAL | Status: DC | PRN

## 2013-04-23 MED ORDER — ONDANSETRON HCL 4 MG PO TABS: 4.0000 mg | ORAL_TABLET | Freq: Four times a day (QID) | ORAL | Status: DC | PRN

## 2013-04-23 MED ORDER — FUROSEMIDE 40 MG PO TABS
40.0000 mg | ORAL_TABLET | Freq: Every day | ORAL | Status: DC
  Administered 2013-04-24 – 2013-04-25 (×2): 40 mg via ORAL
  Filled 2013-04-23 (×2): qty 1

## 2013-04-23 MED ORDER — SODIUM CHLORIDE 0.9 % IV SOLN
INTRAVENOUS | Status: DC
  Administered 2013-04-23: 22:00:00 via INTRAVENOUS

## 2013-04-23 MED ORDER — PANTOPRAZOLE SODIUM 40 MG PO TBEC
40.0000 mg | DELAYED_RELEASE_TABLET | Freq: Every day | ORAL | Status: DC
  Administered 2013-04-24 – 2013-04-25 (×2): 40 mg via ORAL
  Filled 2013-04-23 (×2): qty 1

## 2013-04-23 MED ORDER — ATORVASTATIN CALCIUM 40 MG PO TABS
40.0000 mg | ORAL_TABLET | Freq: Every day | ORAL | Status: DC
  Administered 2013-04-23 – 2013-04-24 (×2): 40 mg via ORAL
  Filled 2013-04-23 (×3): qty 1

## 2013-04-23 MED ORDER — NITROGLYCERIN 0.4 MG SL SUBL: 0.4000 mg | SUBLINGUAL_TABLET | SUBLINGUAL | Status: DC | PRN

## 2013-04-23 MED ORDER — LACTATED RINGERS IV SOLN
INTRAVENOUS | Status: DC
  Administered 2013-04-23: 15:00:00 via INTRAVENOUS

## 2013-04-23 MED ORDER — METOCLOPRAMIDE HCL 5 MG/ML IJ SOLN: 5.0000 mg | Freq: Three times a day (TID) | INTRAMUSCULAR | Status: DC | PRN

## 2013-04-23 MED ORDER — CEFAZOLIN SODIUM-DEXTROSE 2-3 GM-% IV SOLR
INTRAVENOUS | Status: AC
  Filled 2013-04-23: qty 50

## 2013-04-23 MED ORDER — PROPOFOL 10 MG/ML IV BOLUS
INTRAVENOUS | Status: DC | PRN
  Administered 2013-04-23: 100 mg via INTRAVENOUS

## 2013-04-23 MED ORDER — INSULIN GLARGINE 100 UNIT/ML ~~LOC~~ SOLN
30.0000 [IU] | Freq: Two times a day (BID) | SUBCUTANEOUS | Status: DC
  Administered 2013-04-23 – 2013-04-25 (×4): 30 [IU] via SUBCUTANEOUS
  Filled 2013-04-23 (×5): qty 0.3

## 2013-04-23 MED ORDER — CEFAZOLIN SODIUM 1-5 GM-% IV SOLN
1.0000 g | Freq: Four times a day (QID) | INTRAVENOUS | Status: AC
  Administered 2013-04-23 – 2013-04-24 (×3): 1 g via INTRAVENOUS
  Filled 2013-04-23 (×3): qty 50

## 2013-04-23 MED ORDER — ONDANSETRON HCL 4 MG/2ML IJ SOLN: 4.0000 mg | Freq: Four times a day (QID) | INTRAMUSCULAR | Status: DC | PRN

## 2013-04-23 MED ORDER — METOCLOPRAMIDE HCL 10 MG PO TABS: 5.0000 mg | ORAL_TABLET | Freq: Three times a day (TID) | ORAL | Status: DC | PRN

## 2013-04-23 MED ORDER — ONDANSETRON HCL 4 MG/2ML IJ SOLN
INTRAMUSCULAR | Status: DC | PRN
  Administered 2013-04-23: 4 mg via INTRAVENOUS

## 2013-04-23 MED ORDER — DABIGATRAN ETEXILATE MESYLATE 150 MG PO CAPS
150.0000 mg | ORAL_CAPSULE | Freq: Two times a day (BID) | ORAL | Status: DC
  Administered 2013-04-24 – 2013-04-25 (×3): 150 mg via ORAL
  Filled 2013-04-23 (×4): qty 1

## 2013-04-23 MED ORDER — INSULIN ASPART 100 UNIT/ML ~~LOC~~ SOLN
4.0000 [IU] | Freq: Three times a day (TID) | SUBCUTANEOUS | Status: DC
  Administered 2013-04-24 – 2013-04-25 (×5): 4 [IU] via SUBCUTANEOUS

## 2013-04-23 MED ORDER — PAROXETINE HCL 20 MG PO TABS
40.0000 mg | ORAL_TABLET | Freq: Every day | ORAL | Status: DC
Start: 2013-04-24 — End: 2013-04-25
  Administered 2013-04-24 – 2013-04-25 (×2): 40 mg via ORAL
  Filled 2013-04-23 (×2): qty 2

## 2013-04-23 MED ORDER — METFORMIN HCL 500 MG PO TABS
1000.0000 mg | ORAL_TABLET | Freq: Two times a day (BID) | ORAL | Status: DC
  Administered 2013-04-24 – 2013-04-25 (×3): 1000 mg via ORAL
  Filled 2013-04-23 (×5): qty 2

## 2013-04-23 MED ORDER — LIDOCAINE HCL (CARDIAC) 20 MG/ML IV SOLN
INTRAVENOUS | Status: DC | PRN
  Administered 2013-04-23: 60 mg via INTRAVENOUS

## 2013-04-23 MED ORDER — SODIUM CHLORIDE 0.9 % IR SOLN
Status: DC | PRN
  Administered 2013-04-23: 1000 mL

## 2013-04-23 MED ORDER — INSULIN GLARGINE 100 UNIT/ML SOLOSTAR PEN: 30.0000 [IU] | PEN_INJECTOR | Freq: Two times a day (BID) | SUBCUTANEOUS | Status: DC

## 2013-04-23 MED ORDER — MIDAZOLAM HCL 5 MG/5ML IJ SOLN
INTRAMUSCULAR | Status: DC | PRN
  Administered 2013-04-23: 1 mg via INTRAVENOUS

## 2013-04-23 MED ORDER — HYDROMORPHONE HCL PF 1 MG/ML IJ SOLN: 0.5000 mg | INTRAMUSCULAR | Status: DC | PRN

## 2013-04-23 SURGICAL SUPPLY — 48 items
BANDAGE GAUZE ELAST BULKY 4 IN (GAUZE/BANDAGES/DRESSINGS) ×1 IMPLANT
BLADE MICRO 25.3X4.1 (BLADE) ×1 IMPLANT
BLADE SURG 10 STRL SS (BLADE) ×2 IMPLANT
BNDG COHESIVE 4X5 TAN STRL (GAUZE/BANDAGES/DRESSINGS) ×2 IMPLANT
BNDG COHESIVE 6X5 TAN STRL LF (GAUZE/BANDAGES/DRESSINGS) ×3 IMPLANT
BNDG GAUZE STRTCH 6 (GAUZE/BANDAGES/DRESSINGS) ×6 IMPLANT
CLOTH BEACON ORANGE TIMEOUT ST (SAFETY) ×2 IMPLANT
COTTON STERILE ROLL (GAUZE/BANDAGES/DRESSINGS) ×2 IMPLANT
COVER SURGICAL LIGHT HANDLE (MISCELLANEOUS) ×2 IMPLANT
CUFF TOURNIQUET SINGLE 18IN (TOURNIQUET CUFF) ×2 IMPLANT
CUFF TOURNIQUET SINGLE 24IN (TOURNIQUET CUFF) IMPLANT
CUFF TOURNIQUET SINGLE 34IN LL (TOURNIQUET CUFF) IMPLANT
CUFF TOURNIQUET SINGLE 44IN (TOURNIQUET CUFF) IMPLANT
DRAPE U-SHAPE 47X51 STRL (DRAPES) ×2 IMPLANT
DRSG ADAPTIC 3X8 NADH LF (GAUZE/BANDAGES/DRESSINGS) ×2 IMPLANT
DRSG PAD ABDOMINAL 8X10 ST (GAUZE/BANDAGES/DRESSINGS) ×1 IMPLANT
DURAPREP 26ML APPLICATOR (WOUND CARE) ×2 IMPLANT
ELECT CAUTERY BLADE 6.4 (BLADE) IMPLANT
ELECT REM PT RETURN 9FT ADLT (ELECTROSURGICAL)
ELECTRODE REM PT RTRN 9FT ADLT (ELECTROSURGICAL) IMPLANT
GLOVE BIOGEL PI IND STRL 9 (GLOVE) ×1 IMPLANT
GLOVE BIOGEL PI INDICATOR 9 (GLOVE) ×1
GLOVE ECLIPSE 6.5 STRL STRAW (GLOVE) ×1 IMPLANT
GLOVE SURG ORTHO 9.0 STRL STRW (GLOVE) ×2 IMPLANT
GOWN PREVENTION PLUS XLARGE (GOWN DISPOSABLE) ×2 IMPLANT
GOWN SRG XL XLNG 56XLVL 4 (GOWN DISPOSABLE) ×1 IMPLANT
GOWN STRL NON-REIN XL XLG LVL4 (GOWN DISPOSABLE) ×2
HANDPIECE INTERPULSE COAX TIP (DISPOSABLE)
KIT BASIN OR (CUSTOM PROCEDURE TRAY) ×2 IMPLANT
KIT ROOM TURNOVER OR (KITS) ×2 IMPLANT
MANIFOLD NEPTUNE II (INSTRUMENTS) ×2 IMPLANT
NS IRRIG 1000ML POUR BTL (IV SOLUTION) ×2 IMPLANT
PACK ORTHO EXTREMITY (CUSTOM PROCEDURE TRAY) ×2 IMPLANT
PAD ARMBOARD 7.5X6 YLW CONV (MISCELLANEOUS) ×4 IMPLANT
PADDING CAST COTTON 6X4 STRL (CAST SUPPLIES) ×2 IMPLANT
SET HNDPC FAN SPRY TIP SCT (DISPOSABLE) IMPLANT
SPONGE GAUZE 4X4 12PLY (GAUZE/BANDAGES/DRESSINGS) ×2 IMPLANT
SPONGE LAP 18X18 X RAY DECT (DISPOSABLE) ×2 IMPLANT
STOCKINETTE IMPERVIOUS 9X36 MD (GAUZE/BANDAGES/DRESSINGS) ×2 IMPLANT
SUT ETHILON 2 0 PSLX (SUTURE) ×2 IMPLANT
SWAB COLLECTION DEVICE MRSA (MISCELLANEOUS) ×1 IMPLANT
TOWEL OR 17X24 6PK STRL BLUE (TOWEL DISPOSABLE) ×2 IMPLANT
TOWEL OR 17X26 10 PK STRL BLUE (TOWEL DISPOSABLE) ×2 IMPLANT
TUBE ANAEROBIC SPECIMEN COL (MISCELLANEOUS) ×1 IMPLANT
TUBE CONNECTING 12X1/4 (SUCTIONS) ×2 IMPLANT
UNDERPAD 30X30 INCONTINENT (UNDERPADS AND DIAPERS) ×2 IMPLANT
WATER STERILE IRR 1000ML POUR (IV SOLUTION) ×2 IMPLANT
YANKAUER SUCT BULB TIP NO VENT (SUCTIONS) ×2 IMPLANT

## 2013-04-23 NOTE — Anesthesia Preprocedure Evaluation (Addendum)
Anesthesia Evaluation  Patient identified by MRN, date of birth, ID band Patient awake  General Assessment Comment:Mental retardation   Reviewed: Allergy & Precautions, H&P , NPO status , Patient's Chart, lab work & pertinent test results, reviewed documented beta blocker date and time   History of Anesthesia Complications (+) PONV  Airway Mallampati: III TM Distance: >3 FB Neck ROM: Full    Dental no notable dental hx. (+) Poor Dentition and Dental Advisory Given   Pulmonary COPD breath sounds clear to auscultation  Pulmonary exam normal       Cardiovascular hypertension, On Medications and On Home Beta Blockers + Peripheral Vascular Disease + dysrhythmias Atrial Fibrillation Rhythm:Regular Rate:Normal     Neuro/Psych PSYCHIATRIC DISORDERS Anxiety CVA    GI/Hepatic Neg liver ROS, GERD-  Medicated and Controlled,  Endo/Other  diabetes, Type 1, Insulin Dependent  Renal/GU negative Renal ROS  negative genitourinary   Musculoskeletal   Abdominal   Peds  Hematology negative hematology ROS (+) anemia ,   Anesthesia Other Findings   Reproductive/Obstetrics negative OB ROS                         Anesthesia Physical Anesthesia Plan  ASA: III  Anesthesia Plan: General   Post-op Pain Management:    Induction: Intravenous  Airway Management Planned: LMA  Additional Equipment:   Intra-op Plan:   Post-operative Plan: Extubation in OR  Informed Consent: I have reviewed the patients History and Physical, chart, labs and discussed the procedure including the risks, benefits and alternatives for the proposed anesthesia with the patient or authorized representative who has indicated his/her understanding and acceptance.   Dental advisory given  Plan Discussed with: CRNA  Anesthesia Plan Comments:         Anesthesia Quick Evaluation

## 2013-04-23 NOTE — Interval H&P Note (Signed)
History and Physical Interval Note:  04/23/2013 5:25 PM  [name]  has presented today for surgery, with the diagnosis of Abscess Right Foot  The various methods of treatment have been discussed with the patient and family. After consideration of risks, benefits and other options for treatment, the patient has consented to  Procedure(s) with comments: IRRIGATION AND DEBRIDEMENT EXTREMITY (Right) - Right Foot Irrigation and Debridement vs. Mid-foot Amputation as a surgical intervention .  The patient's history has been reviewed, patient examined, no change in status, stable for surgery.  I have reviewed the patient's chart and labs.  Questions were answered to the patient's satisfaction.     Nuri Branca V

## 2013-04-23 NOTE — Transfer of Care (Signed)
Immediate Anesthesia Transfer of Care Note  Patient: [name]  Procedure(s) Performed: Procedure(s): AMPUTATION RIGHT MID-FOOT (Right)  Patient Location: PACU  Anesthesia Type:General  Level of Consciousness: awake, alert  and oriented  Airway & Oxygen Therapy: Patient Spontanous Breathing and Patient connected to nasal cannula oxygen  Post-op Assessment: Report given to PACU RN  Post vital signs: Reviewed and stable  Complications: No apparent anesthesia complications

## 2013-04-23 NOTE — Preoperative (Signed)
Beta Blockers   Reason not to administer Beta Blockers:Not Applicable 

## 2013-04-23 NOTE — Progress Notes (Signed)
Orthopedic Tech Progress Note Patient Details:  [name] 16-Sep-1955 [id number]  Ortho Devices Type of Ortho Device: Postop shoe/boot Ortho Device/Splint Location: RLE Ortho Device/Splint Interventions: Ordered;Application   Jennye Moccasin 04/23/2013, 9:05 PM

## 2013-04-23 NOTE — Anesthesia Postprocedure Evaluation (Signed)
  Anesthesia Post-op Note  Patient: [name]  Procedure(s) Performed: Procedure(s): AMPUTATION RIGHT MID-FOOT (Right)  Patient Location: PACU  Anesthesia Type:General  Level of Consciousness: awake, alert , oriented and patient cooperative  Airway and Oxygen Therapy: Patient Spontanous Breathing and Patient connected to nasal cannula oxygen  Post-op Pain: none  Post-op Assessment: Post-op Vital signs reviewed, Patient's Cardiovascular Status Stable, Respiratory Function Stable, Patent Airway, No signs of Nausea or vomiting, Adequate PO intake and Pain level controlled  Post-op Vital Signs: Reviewed and stable  Complications: No apparent anesthesia complications

## 2013-04-23 NOTE — H&P (View-Only) (Signed)
[name] is an 56 y.o. male.   Chief Complaint: Ulceration osteomyelitis right foot third and fourth toes HPI: Patient is a 56-year-old gentleman with osteomyelitis abscess of the third toe with cellulitis involving the fourth toe. Patient is undergone prolonged conservative therapy and presents at this time for surgical intervention.  Past Medical History  Diagnosis Date  . Hypertension   . Diabetes mellitus   . Mental retardation     Sister helps to take care of him and takes him to appts  . Tobacco user     Smokes 1ppd for multiple years.  Quit after hosp 09/2010.  . Anemia     History of Iron Def Anemia  . Anxiety   . Hyperlipidemia   . Atrial fibrillation 06/2009    CHADS score 2 (HTN, DM), was not on coumadin, but now on Pradaxa for Afib  . Lung nodule   . Chronic low back pain   . CVA (cerebral infarction) 09/2010    Bilateral with Left > Right  . Abdominal hernia     Chronic, not a good surgical candidate  . Carotid artery occlusion   . Obesity   . COPD (chronic obstructive pulmonary disease)   . Abscess, abdomen 12/31/2010    Referred to Wound Care in 01/2011 because of multiple abd abscess with VERY large ventral hernia (please look at image of CT abd/pelvis 09/2010).  Because of hernia I was hesitant to I&D.      . 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 02/14/2013  . Stroke     right hand numbness  . GERD (gastroesophageal reflux disease)   . Pneumonia     hx of  . Shortness of breath     with ambulation  . Edema of both legs     takes Lasix  . Numbness and tingling in hands   . Itching     all over body; pt scratches and has sores on bilateral arms and abdomen    Past Surgical History  Procedure Laterality Date  . Carotid arteriogram  10/2010    30% right ICA stenosis, 40% left ICA stenosis   . Transesophageal echocardiogram  09/2010    No ASD or PFO. EF 60-65%.  Normal systolic function. No evidence of thrombus.   . Transthoracic  echocardiogram  09/2010     The cavity size was normal. Systolic function was vigorous.  EF 65-70%.  Normal wall funciton.   . Debridment of decubitus ulcer Right 02/13/2013  . I&d extremity Right 02/13/2013    Procedure: IRRIGATION AND DEBRIDEMENT FOOT ULCER;  Surgeon: Joshua P Landau, MD;  Location: [facility] OR;  Service: Orthopedics;  Laterality: Right;  PULSE LAVAGE  . Eye surgery    . Cataract extraction, bilateral    . Arm surgery Left     as a child    Family History  Problem Relation Age of Onset  . Heart disease Mother   . Hypertension Sister   . Heart disease Sister   . Heart disease Brother   . Heart disease Father   . Peripheral vascular disease Father   . Heart disease Maternal Grandmother   . Heart attack Maternal Grandmother    Social History:  reports that he quit smoking about 2 years ago. His smoking use included Cigarettes. He has a 42 pack-year smoking history. He quit smokeless tobacco use about 2 years ago. He reports that he does not drink alcohol or use illicit drugs.  Allergies:    Allergies  Allergen Reactions  . Penicillins Hives and Nausea And Vomiting    No prescriptions prior to admission    Results for orders placed during the hospital encounter of 03/28/13 (from the past 48 hour(s))  APTT     Status: Abnormal   Collection Time    03/28/13 10:26 AM      Result Value Range   aPTT 52 (*) 24 - 37 seconds   Comment:            IF BASELINE aPTT IS ELEVATED,     SUGGEST PATIENT RISK ASSESSMENT     BE USED TO DETERMINE APPROPRIATE     ANTICOAGULANT THERAPY.  CBC     Status: Abnormal   Collection Time    03/28/13 10:26 AM      Result Value Range   WBC 10.0  4.0 - 10.5 K/uL   RBC 4.47  4.22 - 5.81 MIL/uL   Hemoglobin 10.4 (*) 13.0 - 17.0 g/dL   HCT 32.5 (*) 39.0 - 52.0 %   MCV 72.7 (*) 78.0 - 100.0 fL   MCH 23.3 (*) 26.0 - 34.0 pg   MCHC 32.0  30.0 - 36.0 g/dL   RDW 15.3  11.5 - 15.5 %   Platelets 296  150 - 400 K/uL  COMPREHENSIVE METABOLIC PANEL      Status: Abnormal   Collection Time    03/28/13 10:26 AM      Result Value Range   Sodium 135  135 - 145 mEq/L   Potassium 5.0  3.5 - 5.1 mEq/L   Chloride 99  96 - 112 mEq/L   CO2 25  19 - 32 mEq/L   Glucose, Bld 189 (*) 70 - 99 mg/dL   BUN 22  6 - 23 mg/dL   Creatinine, Ser 1.08  0.50 - 1.35 mg/dL   Calcium 9.4  8.4 - 10.5 mg/dL   Total Protein 7.4  6.0 - 8.3 g/dL   Albumin 3.0 (*) 3.5 - 5.2 g/dL   AST 14  0 - 37 U/L   ALT 11  0 - 53 U/L   Alkaline Phosphatase 78  39 - 117 U/L   Total Bilirubin 0.2 (*) 0.3 - 1.2 mg/dL   GFR calc non Af Amer 74 (*) >90 mL/min   GFR calc Af Amer 86 (*) >90 mL/min   Comment: (NOTE)     The eGFR has been calculated using the CKD EPI equation.     This calculation has not been validated in all clinical situations.     eGFR's persistently <90 mL/min signify possible Chronic Kidney     Disease.  PROTIME-INR     Status: Abnormal   Collection Time    03/28/13 10:26 AM      Result Value Range   Prothrombin Time 15.9 (*) 11.6 - 15.2 seconds   INR 1.30  0.00 - 1.49   No results found.  Review of Systems  All other systems reviewed and are negative.    There were no vitals taken for this visit. Physical Exam  On examination patient has palpable pulses there is ulceration abscess of the third toe with cellulitis of the fourth toe.  Assessment/Plan Assessment: Ulceration abscess osteomyelitis right foot third toe with cellulitis of the fourth toe.  Plan: Will plan for a third ray amputation. May require a fourth ray amputation depending on the extent of the soft tissue involvement which may involve the fourth metatarsal as well. Will plan for  a   third and possible fourth ray amputation.  DUDA,MARCUS V 03/29/2013, 6:30 AM    

## 2013-04-23 NOTE — Anesthesia Procedure Notes (Signed)
Procedure Name: LMA Insertion Date/Time: 04/23/2013 6:24 PM Performed by: Jefm Miles E Pre-anesthesia Checklist: Patient identified, Emergency Drugs available, Suction available, Patient being monitored and Timeout performed Patient Re-evaluated:Patient Re-evaluated prior to inductionOxygen Delivery Method: Circle system utilized Preoxygenation: Pre-oxygenation with 100% oxygen Intubation Type: IV induction LMA: LMA with gastric port inserted LMA Size: 4.0 Number of attempts: 1 Placement Confirmation: positive ETCO2 and breath sounds checked- equal and bilateral Tube secured with: Tape Dental Injury: Teeth and Oropharynx as per pre-operative assessment

## 2013-04-23 NOTE — Op Note (Signed)
OPERATIVE REPORT  DATE OF SURGERY: 04/23/2013  PATIENT:  [name],  57 y.o. male  PRE-OPERATIVE DIAGNOSIS:  Abscess Right Foot  POST-OPERATIVE DIAGNOSIS:  Abscess Right Foot  PROCEDURE:  Procedure(s): AMPUTATION RIGHT MID-FOOT  SURGEON:  Surgeon(s): Nadara Mustard, MD  ANESTHESIA:   general  EBL:  min ML  SPECIMEN:  Source of Specimen:  Right forefoot  TOURNIQUET:  * No tourniquets in log *  PROCEDURE DETAILS: Patient is a 57 year old gentleman who is status post foot salvage surgery about a month ago with a third ray amputation. Patient presents today at the office with maggots currently drainage from the forefoot despite treatment with by mouth antibiotics with doxycycline. Do to the large draining ulcer in the next chronic skin condition patient presents at this time for midfoot amputation versus irrigation and debridement. Risks and benefits were discussed with the patient and his sister who is the power of attorney including persistent infection nonhealing of the wound need for additional surgery. Patient and his sister state understand and wish to proceed at this time. Description of procedure patient was brought to the operating room and underwent a general anesthetic. After adequate levels of anesthesia were obtained patient's right lower extremity was prepped using DuraPrep and draped into a sterile field. An incision was made over the abscess this is a very large area of infection cultures were obtained. Due the fact the patient has been on doxycycline for a month it is unlikely that these yield any positive cultures. Due to the large abscess patient has procedure was proceeded to be a midfoot amputation. The bone was amputated through the mid metatarsals. There was good petechial bleeding hemostasis was obtained the wound was irrigated with normal saline there was no residual tissue from the abscessed area. The incision was closed using 2-0 nylon without tension on the skin.  The wound is covered with Adaptic orthopedic sponges AB dressing Kerlix and Coban. Patient is also wrapped in a compressive wrap from his tibial tubercle distally. Patient was extubated taken to the PACU in stable condition.  PLAN OF CARE: Admit to inpatient   PATIENT DISPOSITION:  PACU - hemodynamically stable.   Nadara Mustard, MD 04/23/2013 7:03 PM

## 2013-04-23 NOTE — Anesthesia Postprocedure Evaluation (Signed)
Anesthesia Post Note  Patient: [name]  Procedure(s) Performed: Procedure(s) (LRB): AMPUTATION RIGHT MID-FOOT (Right)  Anesthesia type: general  Patient location: PACU  Post pain: Pain level controlled  Post assessment: Patient's Cardiovascular Status Stable  Last Vitals:  Filed Vitals:   04/23/13 2003  BP: 113/47  Pulse:   Temp:   Resp:     Post vital signs: Reviewed and stable  Level of consciousness: sedated  Complications: No apparent anesthesia complications

## 2013-04-24 LAB — GLUCOSE, CAPILLARY
Glucose-Capillary: 184 mg/dL — ABNORMAL HIGH (ref 70–99)
Glucose-Capillary: 132 mg/dL — ABNORMAL HIGH (ref 70–99)
Glucose-Capillary: 160 mg/dL — ABNORMAL HIGH (ref 70–99)

## 2013-04-24 MED ORDER — INFLUENZA VAC SPLIT QUAD 0.5 ML IM SUSP
0.5000 mL | INTRAMUSCULAR | Status: AC
  Administered 2013-04-25: 0.5 mL via INTRAMUSCULAR
  Filled 2013-04-24: qty 0.5

## 2013-04-24 NOTE — Progress Notes (Signed)
Utilization review completed.  

## 2013-04-24 NOTE — Plan of Care (Signed)
Problem: Acute Rehab PT Goals(only PT should resolve) Goal: Pt Will Go Up/Down Stairs Could possibly educate sister and her boyfriend on technique to transfer pt up steps with wheelchair.

## 2013-04-24 NOTE — Progress Notes (Signed)
Patient ID: [name], male   DOB: 1955-08-24, 57 y.o.   MRN: [id number] Postoperative day 1 midfoot amputation right foot. Dressing clean and dry. Physical therapy progressive ambulation touchdown weightbearing on the right. Anticipate discharge back to home with family tomorrow.

## 2013-04-24 NOTE — Progress Notes (Signed)
04/24/13 Spoke with Lilian Coma RN from [facility]. She stated that they would provide PT and a 3N1 for patient through [facility]. I will contact Lupita Leash when patient is discharged. Will continue to follow. Jacquelynn Cree RN, BSN, [facility]     04/24/13 Spoke with patient about d/c plans. Patient states that he is a member of [facility], tel # [phone]. He attends the [facility] daycare program 3 times/wk.Mr Griep has a wheelchair, rolling walker and cane but would like a 3N1. Contacted [facility], his assigned RN is  Lilian Coma and she will set up whatever patient needs, left voicemail.Will continue to follow. Jacquelynn Cree RN, BSN, CCM

## 2013-04-24 NOTE — Evaluation (Signed)
Physical Therapy Evaluation Patient Details Name: [name] MRN: [id number] DOB: May 03, 1956 Today's Date: 04/24/2013 Time: 0865-7846 PT Time Calculation (min): 18 min  PT Assessment / Plan / Recommendation History of Present Illness  Patient is a 57 year old gentleman with osteomyelitis abscess of the third toe with cellulitis involving the fourth toe.  Patient underwent AMPUTATION RAY right foot third ray   Clinical Impression  Patient is s/p Rt midfoot amputation surgery resulting in functional limitations due to the deficits listed below (see PT Problem List). Limited understanding of caregiver (A) available and PLOF due to cognition deficits. Pt adamantly refusing SNF and will be safe for home D/C with 24/7 care by sister and if pt can be supervision to mod I for wheelchair <> 3 in 1 <> bed. Pt has difficulty maintaining TDWB status due to decr balance and decr cognition. Patient will benefit from skilled PT to increase their independence and safety with mobility to allow discharge to the venue listed below.      PT Assessment  Patient needs continued PT services    Follow Up Recommendations  Home health PT;Supervision/Assistance - 24 hour;Supervision for mobility/OOB    Does the patient have the potential to tolerate intense rehabilitation      Barriers to Discharge   unclear on (A) sister is able to give pt at home     Equipment Recommendations  Rolling walker with 5" wheels;3in1 (PT)    Recommendations for Other Services OT consult   Frequency Min 4X/week    Precautions / Restrictions Precautions Precautions: Fall Precaution Comments: pt reports he fell in yard 2 weeks ago Restrictions Weight Bearing Restrictions: Yes RLE Weight Bearing: Touchdown weight bearing   Pertinent Vitals/Pain "maybe a 3 or so" patient repositioned for comfort       Mobility  Bed Mobility Bed Mobility: Not assessed Details for Bed Mobility Assistance: pt sitting in chair   Transfers Transfers: Sit to Stand;Stand to Sit Sit to Stand: 4: Min assist;From chair/3-in-1;With armrests Stand to Sit: 4: Min guard;To chair/3-in-1;With armrests Details for Transfer Assistance: cues for hand placement and safety with RW: pt is impulsive with transfers and requries cues to maintain TDWB status on Rt LE and maintain balance with activity Ambulation/Gait Ambulation/Gait Assistance: 4: Min assist Ambulation Distance (Feet): 3 Feet (fwd then 3 bwd ) Assistive device: Rolling walker Ambulation/Gait Assistance Details: cues to maintain TDWB status on Rt LE and maintain balance; cues for safety and management of RW    Gait Pattern: Step-to pattern;Trunk flexed Gait velocity: decr General Gait Details: pt tends to put weight through Rt heel; has difficulty maintaining TDWB status due to cognition  Stairs: No Wheelchair Mobility Wheelchair Mobility: No         PT Diagnosis: Difficulty walking;Acute pain  PT Problem List: Decreased strength;Decreased range of motion;Decreased activity tolerance;Decreased balance;Decreased mobility;Decreased cognition;Decreased knowledge of use of DME;Decreased knowledge of precautions;Pain PT Treatment Interventions: DME instruction;Gait training;Stair training;Functional mobility training;Therapeutic activities;Therapeutic exercise;Balance training;Patient/family education;Wheelchair mobility training     PT Goals(Current goals can be found in the care plan section) Acute Rehab PT Goals Patient Stated Goal: "you arent sending me to rehab"  PT Goal Formulation: With patient Time For Goal Achievement: 05/01/13 Potential to Achieve Goals: Good  Visit Information  Last PT Received On: 04/24/13 Assistance Needed: +2 (have 2 only for steps ) History of Present Illness: Patient is a 57 year old gentleman with osteomyelitis abscess of the third toe with cellulitis involving the fourth toe.  Patient underwent AMPUTATION RAY  right foot third ray         Prior Functioning  Home Living Family/patient expects to be discharged to:: Private residence Living Arrangements: Other relatives;Other (Comment) (lives with sister ) Available Help at Discharge: Family;Available 24 hours/day Type of Home: House Home Access: Stairs to enter [employer] of Steps: 1 (if goes through front) Entrance Stairs-Rails: None Home Layout: One level Home Equipment: Crutches;Wheelchair - manual;Cane - single point Additional Comments: pt lives with sister; reports he was minimally ambulatory (household distances) but would mainly just walk when going from wheelchair to toilet to shower Prior Function Level of Independence: Needs assistance Comments: pt reports that he goes to [facility]; unclear as to accuracy of information; pt reports his sister would help him but then would say he could do everything himself except cook  Communication Communication: No difficulties Dominant Hand: Right    Cognition  Cognition Arousal/Alertness: Awake/alert Behavior During Therapy: Impulsive Overall Cognitive Status: History of cognitive impairments - at baseline    Extremity/Trunk Assessment Upper Extremity Assessment Upper Extremity Assessment: Overall WFL for tasks assessed Lower Extremity Assessment Lower Extremity Assessment: RLE deficits/detail RLE Deficits / Details: Limited assessment of Rt ankle due to TDWB status and pain  RLE: Unable to fully assess due to pain Cervical / Trunk Assessment Cervical / Trunk Assessment: Kyphotic   Balance Balance Balance Assessed: Yes Static Standing Balance Static Standing - Balance Support: Bilateral upper extremity supported;During functional activity Static Standing - Level of Assistance: 4: Min assist  End of Session PT - End of Session Equipment Utilized During Treatment: Gait belt Activity Tolerance: Patient tolerated treatment well Patient left: in chair;with call bell/phone within reach Nurse  Communication: Mobility status  GP     Donell Sievert, [place] [phone] 04/24/2013, 11:51 AM

## 2013-04-25 ENCOUNTER — Encounter (HOSPITAL_COMMUNITY): Payer: Self-pay | Admitting: Orthopedic Surgery

## 2013-04-25 LAB — GLUCOSE, CAPILLARY

## 2013-04-25 MED ORDER — HYDROCODONE-ACETAMINOPHEN 5-325 MG PO TABS: 1.0000 | ORAL_TABLET | Freq: Four times a day (QID) | ORAL | Status: DC | PRN

## 2013-04-25 NOTE — Discharge Summary (Signed)
Physician Discharge Summary  Patient ID: [name] MRN: [id number] DOB/AGE: 1956/07/24 57 y.o.  Admit date: 04/23/2013 Discharge date: 04/25/2013  Admission Diagnoses: Abscess right forefoot  Discharge Diagnoses:  Abscess right forefoot Active Problems:   * No active hospital problems. *   Discharged Condition: stable  Hospital Course: Patient's hospital course was essentially unremarkable. He underwent a right midfoot amputation. Postoperatively patient progressed well with therapy he was discharged to home with his sister for 24 hours a day assistance.  Consults: None  Significant Diagnostic Studies: labs: Routine labs  Treatments: surgery: See operative note  Discharge Exam: Blood pressure 131/68, pulse 71, temperature 98.7 F (37.1 C), temperature source Oral, resp. rate 18, height 5' (1.524 m), weight 100.699 kg (222 lb), SpO2 96.00%. Incision/Wound: dressing clean dry and intact  Disposition: 01-Home or Self Care   Future Appointments Provider Department Dept Phone   08/29/2013 11:00 AM [facility] [phone]   08/29/2013 12:00 PM Carma Lair Nickel, NP [facility] [phone]       Medication List    ASK your doctor about these medications       aspirin 81 MG EC tablet  Take 1 tablet (81 mg total) by mouth daily.     atorvastatin 40 MG tablet  Commonly known as:  LIPITOR  Take 40 mg by mouth daily.     dabigatran 150 MG Caps capsule  Commonly known as:  PRADAXA  Take 150 mg by mouth every 12 (twelve) hours.     diltiazem 240 MG 24 hr capsule  Commonly known as:  CARDIZEM CD  Take 240 mg by mouth daily.     furosemide 40 MG tablet  Commonly known as:  LASIX  Take 1 tablet (40 mg total) by mouth daily.     gabapentin 300 MG capsule  Commonly known as:  NEURONTIN  Take 300 mg by mouth 2 (two) times daily.     glucose blood test strip  Use as instructed     HYDROcodone-acetaminophen 5-325 MG per tablet  Commonly known as:  NORCO  Take 1 tablet by mouth every 6 (six) hours as needed for pain.     hydrocortisone 2.5 % ointment  Apply topically 3 (three) times daily. To leg around wound. Do not apply to broken skin.     LANTUS SOLOSTAR 100 UNIT/ML Sopn  Generic drug:  Insulin Glargine  Inject 30 Units into the skin 2 (two) times daily.     metFORMIN 1000 MG tablet  Commonly known as:  GLUCOPHAGE  Take 1,000 mg by mouth 2 (two) times daily with a meal.     metoprolol tartrate 25 MG tablet  Commonly known as:  LOPRESSOR  Take 25 mg by mouth 2 (two) times daily.     nitroGLYCERIN 0.4 MG SL tablet  Commonly known as:  NITROSTAT  Place 0.4 mg under the tongue every 5 (five) minutes as needed for chest pain.     omeprazole 20 MG capsule  Commonly known as:  PRILOSEC  Take 20 mg by mouth daily.     onetouch ultrasoft lancets  Use as instructed     PARoxetine 40 MG tablet  Commonly known as:  PAXIL  Take 40 mg by mouth every morning.           Follow-up Information   Follow up with Bernal Luhman V, MD In 1 week.   Specialty:  Orthopedic Surgery   Contact information:  [address] [place] [address] [phone]       Signed: Nadara Mustard 04/25/2013, 6:26 AM

## 2013-04-25 NOTE — Progress Notes (Signed)
Physical Therapy Treatment Patient Details Name: [name] MRN: [id number] DOB: 07-16-1956 Today's Date: 04/25/2013 Time: 1105-1130 PT Time Calculation (min): 25 min  PT Assessment / Plan / Recommendation  History of Present Illness Patient is a 57 year old gentleman with osteomyelitis abscess of the third toe with cellulitis involving the fourth toe.  Patient underwent AMPUTATION RAY right foot third ray    PT Comments   Phoned pt sister regarding home setup. Sister reports she is attempting to change schedule to be home with pt during the day. Encouraged pt to have  24/7 (A) for pt. Sister also reports pt is going to have (A) by [facility] to get into house or [facility] is building a ramp for pt. Discussed progress of therapy with pt. Pt is a min (A) for amb and min guard for Sit <> stand transfers. Pt would benefit from 3 in 1 upon D/C home to decrease risk of falls and incr independence. Pt is a high fall risk and has difficulties maintaining TWB status due to cognition deficits and balance deficits.  Pt is safe from mobility standpoint to D/C home with sister and for transfers to and from wheelchair for mobility.   Follow Up Recommendations  Home health PT;Supervision/Assistance - 24 hour;Supervision for mobility/OOB pt sister reports he will be getting therapy from [facility].     Does the patient have the potential to tolerate intense rehabilitation     Barriers to Discharge        Equipment Recommendations  Rolling walker with 5" wheels;3in1 (PT)    Recommendations for Other Services OT consult  Frequency Min 4X/week   Progress towards PT Goals Progress towards PT goals: Progressing toward goals  Plan Current plan remains appropriate    Precautions / Restrictions Precautions Precautions: Fall Required Braces or Orthoses: Other Brace/Splint Other Brace/Splint: post op shoe Restrictions Weight Bearing Restrictions: Yes RLE Weight Bearing: Touchdown weight bearing   Pertinent  Vitals/Pain 10/10 "when im up and moving" premedicated by RN    Mobility  Bed Mobility Bed Mobility: Not assessed Details for Bed Mobility Assistance: pt sitting in chair  Transfers Transfers: Sit to Stand;Stand to Sit Sit to Stand: 4: Min guard;From chair/3-in-1;With armrests Stand to Sit: 4: Min guard;To chair/3-in-1;With armrests Details for Transfer Assistance: min guard to steady; cues for hand placement and safe management of RW Ambulation/Gait Ambulation/Gait Assistance: 4: Min assist Ambulation Distance (Feet): 6 Feet Assistive device: Rolling walker Ambulation/Gait Assistance Details: pt more unsteady with amb today; required (A) to maintain balance and advance while attempting to maintain TWB status; pt has difficulty maintaining TWB status and is more safe for basic transfers vs. amb at this time. discussed WB status with sister on phone and encouraged her to be with pt if pt is attempting to amb; pt is a fall risk  Gait Pattern: Step-to pattern;Trunk flexed Gait velocity: decreased General Gait Details: pt tends to put weight through Rt heel; has difficulty maintaining TDWB status due to cognition  Stairs: No (pt unsafe to attempt "hopping technique") Wheelchair Mobility Wheelchair Mobility: No         PT Diagnosis:    PT Problem List:   PT Treatment Interventions:     PT Goals (current goals can now be found in the care plan section) Acute Rehab PT Goals Patient Stated Goal: home tomorrow so i can have some help today PT Goal Formulation: With patient Time For Goal Achievement: 05/01/13 Potential to Achieve Goals: Good  Visit Information  Last PT Received  On: 04/25/13 Assistance Needed: +1 History of Present Illness: Patient is a 57 year old gentleman with osteomyelitis abscess of the third toe with cellulitis involving the fourth toe.  Patient underwent AMPUTATION RAY right foot third ray     Subjective Data  Subjective: pt sitting in chair; concerned about  how much help he will have at home. states "my sister has to babysit so i wont have anyone to help me during th day. Can i just stay another night?" Patient Stated Goal: home tomorrow so i can have some help today   Cognition  Cognition Arousal/Alertness: Awake/alert Behavior During Therapy: Impulsive Overall Cognitive Status: History of cognitive impairments - at baseline    Balance  Balance Balance Assessed: Yes Static Standing Balance Static Standing - Balance Support: Bilateral upper extremity supported;During functional activity Static Standing - Level of Assistance: 4: Min assist  End of Session PT - End of Session Equipment Utilized During Treatment: Gait belt;Other (comment) (post op shoe ) Activity Tolerance: Patient tolerated treatment well Patient left: in chair;with call bell/phone within reach Nurse Communication: Mobility status   GP     Donell Sievert, [place] [phone] 04/25/2013, 1:03 PM

## 2013-04-26 LAB — CULTURE, ROUTINE-ABSCESS: Culture: NO GROWTH

## 2013-04-28 LAB — ANAEROBIC CULTURE

## 2013-05-02 ENCOUNTER — Telehealth: Payer: Self-pay | Admitting: Family Medicine

## 2013-05-02 NOTE — Telephone Encounter (Signed)
I haven't seen [name] in the clinic in several months but have been copied recently on his surgical reports. I had referred him to [facility] several months ago, so I called [facility] today to inquire about whether he has officially transferred care to them. The [facility] representative stated that yes, they are now his official PCP, and he is doing well at this program. I am very glad to hear this, and am hopeful that he will continue to thrive there.  I did obtain their fax number to send several results to them that I had wanted to follow up with him on (but he no-showed to his last two prior appointments with me in July and August). Will send those results to the [facility] program. Will also change his PCP in Epic to Dr. Marny Lowenstein, who is his new doctor at [facility].  [name], MD [facility] PGY-2

## 2013-05-29 ENCOUNTER — Encounter: Payer: Self-pay | Admitting: Internal Medicine

## 2013-06-18 ENCOUNTER — Ambulatory Visit
Admission: RE | Admit: 2013-06-18 | Discharge: 2013-06-18 | Disposition: A | Discharge status: Home / Self Care | Payer: No Typology Code available for payment source | Source: Ambulatory Visit | Attending: *Deleted | Admitting: *Deleted

## 2013-06-18 ENCOUNTER — Other Ambulatory Visit: Payer: Self-pay | Admitting: *Deleted

## 2013-06-18 DIAGNOSIS — M79605 Pain in left leg: Secondary | ICD-10-CM

## 2013-07-02 ENCOUNTER — Encounter: Payer: Self-pay | Admitting: Internal Medicine

## 2013-07-03 ENCOUNTER — Ambulatory Visit: Payer: No Typology Code available for payment source | Admitting: Internal Medicine

## 2013-07-30 ENCOUNTER — Encounter: Payer: Self-pay | Admitting: Internal Medicine

## 2013-08-07 ENCOUNTER — Ambulatory Visit: Payer: Medicare (Managed Care) | Admitting: Internal Medicine

## 2013-08-07 ENCOUNTER — Telehealth: Payer: Self-pay | Admitting: Internal Medicine

## 2013-08-07 NOTE — Telephone Encounter (Signed)
No charge. 

## 2013-08-28 ENCOUNTER — Encounter: Payer: Self-pay | Admitting: Family

## 2013-08-29 ENCOUNTER — Ambulatory Visit: Payer: No Typology Code available for payment source | Admitting: Family

## 2013-08-29 ENCOUNTER — Other Ambulatory Visit (HOSPITAL_COMMUNITY): Payer: No Typology Code available for payment source

## 2013-10-10 ENCOUNTER — Encounter: Payer: Self-pay | Admitting: Internal Medicine

## 2013-10-31 ENCOUNTER — Encounter: Payer: Self-pay | Admitting: Internal Medicine

## 2013-11-12 ENCOUNTER — Telehealth: Payer: Self-pay | Admitting: *Deleted

## 2013-11-12 NOTE — Telephone Encounter (Signed)
LM for the office that made this appt, [facility], [phone]. I asked if they could fax any recent labs, office note.  Asked on the message if they could fax these to my attention. I left my name and number.

## 2013-11-13 ENCOUNTER — Encounter: Payer: Self-pay | Admitting: Physician Assistant

## 2013-11-13 ENCOUNTER — Ambulatory Visit (INDEPENDENT_AMBULATORY_CARE_PROVIDER_SITE_OTHER): Payer: Medicare (Managed Care) | Admitting: Physician Assistant

## 2013-11-13 VITALS — BP 122/62 | HR 60 | Ht 60.0 in | Wt 233.0 lb

## 2013-11-13 DIAGNOSIS — Z1211 Encounter for screening for malignant neoplasm of colon: Secondary | ICD-10-CM

## 2013-11-13 DIAGNOSIS — Z7901 Long term (current) use of anticoagulants: Secondary | ICD-10-CM

## 2013-11-13 NOTE — Progress Notes (Addendum)
Subjective:    Patient ID: [name], male    DOB: Nov 08, 1955, 58 y.o.   MRN: [id number]  HPI  [name] is a pleasant 58 year old white male referred by Dr. Bradd Burner for colon neoplasia screening. He had been scheduled directly for colonoscopy with Dr. Hilarie Fredrickson but due to chronic  anticoagulation with Pradaxa  is seen in the office today.. Patient has multiple medical problems including insulin-dependent diabetes mellitus, mental retardation, history of CVA, carotid stenosis, atrial fibrillation, COPD. He has a massive ventral hernia which she says has been repaired twice but has recurred. He has no current GI complaints other than right-sided abdominal discomfort from the hernia which has been present for a long time. He denies any problems with changes in his bowel habits diarrhea melena or hematochezia. He has not had any prior colon screening. Family history is pertinent for a great grandmother with colon cancer. Patient is not sure why he is on Pradaxa.    Review of Systems  Constitutional: Negative.   HENT: Negative.   Eyes: Negative.   Respiratory: Negative.   Cardiovascular: Negative.   Gastrointestinal: Positive for abdominal pain.  Endocrine: Negative.   Genitourinary: Negative.   Musculoskeletal: Positive for gait problem.  Skin: Negative.   Allergic/Immunologic: Negative.   Neurological: Negative.   Hematological: Negative.   Psychiatric/Behavioral: Negative.    Outpatient Prescriptions Prior to Visit  Medication Sig Dispense Refill  . aspirin EC 81 MG EC tablet Take 1 tablet (81 mg total) by mouth daily.  90 tablet  3  . atorvastatin (LIPITOR) 40 MG tablet Take 40 mg by mouth daily.      . dabigatran (PRADAXA) 150 MG CAPS Take 150 mg by mouth every 12 (twelve) hours.      Marland Kitchen diltiazem (CARDIZEM CD) 240 MG 24 hr capsule Take 240 mg by mouth daily.      . ferrous gluconate (FERGON) 325 MG tablet Take 325 mg by mouth 2 (two) times daily.      . furosemide (LASIX) 40 MG  tablet Take 1 tablet (40 mg total) by mouth daily.  30 tablet  2  . gabapentin (NEURONTIN) 300 MG capsule Take 300 mg by mouth 2 (two) times daily.      Marland Kitchen glucose blood test strip Use as instructed  100 each  3  . HYDROcodone-acetaminophen (NORCO) 5-325 MG per tablet Take 1 tablet by mouth every 6 (six) hours as needed for pain.  30 tablet  0  . hydrocortisone 2.5 % ointment Apply topically 3 (three) times daily. To leg around wound. Do not apply to broken skin.  30 g  0  . insulin glargine (LANTUS) 100 UNIT/ML injection Inject 60 Units into the skin at bedtime.      . Lancets (ONETOUCH ULTRASOFT) lancets Use as instructed  100 each  3  . metFORMIN (GLUCOPHAGE) 1000 MG tablet Take 1,000 mg by mouth 2 (two) times daily with a meal.      . metoprolol tartrate (LOPRESSOR) 25 MG tablet Take 25 mg by mouth 2 (two) times daily.      . nitroGLYCERIN (NITROSTAT) 0.4 MG SL tablet Place 0.4 mg under the tongue every 5 (five) minutes as needed for chest pain.       Marland Kitchen omeprazole (PRILOSEC) 20 MG capsule Take 20 mg by mouth daily.      Marland Kitchen PARoxetine (PAXIL) 40 MG tablet Take 40 mg by mouth every morning.      . Vitamin D, Ergocalciferol, (DRISDOL) 50000 UNITS  CAPS capsule Take 50,000 Units by mouth every 7 (seven) days.      Marland Kitchen HYDROcodone-acetaminophen (NORCO) 5-325 MG per tablet Take 1 tablet by mouth every 6 (six) hours as needed for pain.  30 tablet  0   No facility-administered medications prior to visit.   Allergies  Allergen Reactions  . Penicillins Hives and Nausea And Vomiting       Patient Active Problem List   Diagnosis Date Noted  . 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 02/14/2013  . Wound, open, foot 12/13/2012  . Anemia 12/13/2012  . Chest tightness 08/29/2012  . Heart murmur, systolic 81/82/9937  . Chest discomfort 07/25/2012  . Hyperkalemia 05/22/2012  . Diabetic leg ulcer 05/15/2012  . Routine health maintenance 05/15/2012  . Leg weakness, bilateral 12/10/2011    . Carotid artery stenosis, asymptomatic 08/31/2011  . Sciatica 05/22/2011  . Ventral hernia 01/24/2011  . Incidental lung nodule, greater than or equal to 30mm 11/24/2010  . CVA (cerebral vascular accident) 10/24/2010  . Atrial fibrillation 07/24/2009  . ANXIETY 06/22/2007  . DM (diabetes mellitus), type 2, uncontrolled 12/22/2006  . HYPERTENSION 12/22/2006  . HYPERCHOLESTEROLEMIA 10/05/2006  . TOBACCO DEPENDENCE 10/05/2006  . MENTAL RETARDATION 10/05/2006  . COPD 10/05/2006   History  Substance Use Topics  . Smoking status: Former Smoker -- 1.00 packs/day for 42 years    Types: Cigarettes    Quit date: 11/10/2010  . Smokeless tobacco: Former Systems developer    Quit date: 10/02/2010  . Alcohol Use: No   family history includes Heart attack in his maternal grandmother; Heart disease in his brother, father, maternal grandmother, mother, and sister; Hypertension in his sister; Peripheral vascular disease in his father.  Objective:   Physical Exam  well-developed white male in no acute distress,  obtain blood pressure 122/62 pulse 60 height 5 foot weight 233, BMI 45. He ambulates with a cane. HEENT; nontraumatic normocephalic EOMI PERRLA sclera anicteric, Supple; no JVD, Cardiovascular; regular rate and rhythm with S1-S2 no murmur or gallop, Pulmonary ;clear bilaterally, Abdomen; morbidly obese he has a huge ventral hernia protruding from his right abdomen likely containing much of his intestinal content, Bowel sounds are active no palpable hepatosplenomegaly, Extremities ;no clubbing cyanosis or edema skin warm and dry, Psych; mood and affect appropriate        Assessment & Plan:  #[birth year] 58 year old male seen for colon neoplasia screening. He is of average risk and asymptomatic. #2 chronic anticoagulation with Pradaxa #3 insulin-dependent diabetes mellitus #4 mental retardation #5 prior history of CVA #6 carotid stenosis #7 atrial fibrillation #8 hypertension #9 COPD #10 massive ventral  hernia- status post repair x2  Plan; Patient is not a good candidate for screening colonoscopy do to multiple medical issues and chronic anticoagulation. In addition his massive ventral hernia is very likely to make colonoscopy technically difficult and increase his risk. Therefore will initially screen with Cologuard stool DNA test-if this is negative he will not require colonoscopy, if positive will need return office visit with Dr. Hilarie Fredrickson and further discussion regarding options.  Addendum: Reviewed and agree with initial management. Jerene Bears, MD

## 2013-11-13 NOTE — Patient Instructions (Signed)
You will be contacted by [employer] and will receive a package with instructions . Follow the instructions and mail the package back to them.   This test  Is for colon cancer screening.   You will be notified with the results by our office. This may take 2-3 weeks.

## 2013-11-13 NOTE — Telephone Encounter (Signed)
Received fax from [place]. Gave information to [employer], today 11-12-2013.

## 2013-11-18 ENCOUNTER — Telehealth: Payer: Self-pay | Admitting: Physician Assistant

## 2013-11-18 NOTE — Telephone Encounter (Signed)
I called and had to leave a message for [facility] to please call me back.

## 2013-11-18 NOTE — Telephone Encounter (Signed)
Haynes Dage called me back and said they do not have a contract with [employer] and she is calling [employer] [facility] about ordering this test. I told her I wasn't aware [facility] could do this test.  She said she will call me back if she needs to.

## 2013-11-19 ENCOUNTER — Encounter: Payer: Medicare (Managed Care) | Admitting: Internal Medicine

## 2013-11-20 NOTE — Telephone Encounter (Signed)
I called Haynes Dage at [facility].[phone]. I told her Amy Trellis Paganini PA-C suggest we have the patient get labs, repeat cbc and iron studies.  Also if we even consider scheduling this patient for a colonoscopy , we need to have the doctor that is going to scope him see the patient in the office first.  Johnsie Cancel said she will call Dr. Bradd Burner about repeating the labs and will call me back once she gets the results. At that time she may call us about possibly scheduling the office appointment with Dr. Hilarie Fredrickson.

## 2013-11-20 NOTE — Telephone Encounter (Signed)
Ok- labs from 03/2013 reviewed- [name] was anemic... [name] will need repeat cbc, and fe studies, and an office visit with Dr. Hilarie Fredrickson prior to deciding about colonoscopy

## 2013-11-20 NOTE — Telephone Encounter (Signed)
[name] called me and said that Coreon has iron def anemia.  She is faxing me labs per my request.  She thinks even though he is complicated with a vertricle hernia etc, that she thinks we might need to consider a colonoscopy, for diagnostic reasons.  I told her I will talk to Amy and get back to her.

## 2013-11-20 NOTE — Telephone Encounter (Signed)
Haynes Dage at [facility] called me back after she spoke to Dr. Bradd Burner.  Dr. Bradd Burner doesn't didn't agree to labs being repeated at this time.  I explained that Amy thought he should have labs now,  CBC and Iron studies but Dr. Bradd Burner and Johnsie Cancel declined.  Haynes Dage did agree to me making an appointment with Dr. Hilarie Fredrickson  for 01-09-2014 at 9:45 am. I told Haynes Dage that Dr. Hilarie Fredrickson will discuss a colonoscopy with the patient but we are not promising that we can do one with his problems, including  the Ventral hernia.

## 2013-11-25 ENCOUNTER — Telehealth: Payer: Self-pay | Admitting: Physician Assistant

## 2013-11-25 NOTE — Telephone Encounter (Signed)
I called and told [name] to bring Hung to our office building and go to the basement to the Medical Records department and he will sign a paper stating she is can have his medical information. She will be authorized once he signs. She said she will do that this week.

## 2014-01-06 ENCOUNTER — Encounter: Payer: Self-pay | Admitting: Internal Medicine

## 2014-01-09 ENCOUNTER — Other Ambulatory Visit: Payer: Medicaid Other

## 2014-01-09 ENCOUNTER — Encounter: Payer: Self-pay | Admitting: Internal Medicine

## 2014-01-09 ENCOUNTER — Other Ambulatory Visit (INDEPENDENT_AMBULATORY_CARE_PROVIDER_SITE_OTHER): Payer: Medicare (Managed Care)

## 2014-01-09 ENCOUNTER — Ambulatory Visit (INDEPENDENT_AMBULATORY_CARE_PROVIDER_SITE_OTHER): Payer: Medicare (Managed Care) | Admitting: Internal Medicine

## 2014-01-09 VITALS — BP 124/76 | HR 84 | Ht 60.0 in | Wt 230.8 lb

## 2014-01-09 DIAGNOSIS — Z1211 Encounter for screening for malignant neoplasm of colon: Secondary | ICD-10-CM

## 2014-01-09 DIAGNOSIS — D509 Iron deficiency anemia, unspecified: Secondary | ICD-10-CM

## 2014-01-09 DIAGNOSIS — K439 Ventral hernia without obstruction or gangrene: Secondary | ICD-10-CM

## 2014-01-09 DIAGNOSIS — Z7901 Long term (current) use of anticoagulants: Secondary | ICD-10-CM

## 2014-01-09 LAB — CBC
HEMATOCRIT: 35.1 % — AB (ref 39.0–52.0)
Hemoglobin: 11.5 g/dL — ABNORMAL LOW (ref 13.0–17.0)
MCHC: 32.7 g/dL (ref 30.0–36.0)
MCV: 79.7 fl (ref 78.0–100.0)
Platelets: 254 10*3/uL (ref 150.0–400.0)
RBC: 4.41 Mil/uL (ref 4.22–5.81)
RDW: 16.2 % — AB (ref 11.5–15.5)
WBC: 10.2 10*3/uL (ref 4.0–10.5)

## 2014-01-09 LAB — IBC PANEL
IRON: 31 ug/dL — AB (ref 42–165)
Saturation Ratios: 11 % — ABNORMAL LOW (ref 20.0–50.0)
TRANSFERRIN: 202.1 mg/dL — AB (ref 212.0–360.0)

## 2014-01-09 LAB — FERRITIN: FERRITIN: 39.7 ng/mL (ref 22.0–322.0)

## 2014-01-09 NOTE — Patient Instructions (Signed)
Your physician has requested that you go to the basement for the following lab work before leaving today: Iron studies  You have been referred to [facility] for consultation for Ventral Hernia. They will contact you to schedule that appointment   Follow up with Dr. Hilarie Fredrickson in office after consultation with [facility]

## 2014-01-09 NOTE — Progress Notes (Signed)
Patient ID: [name], male   DOB: 11/23/1955, 58 y.o.   MRN: [id number] HPI: [name] is a 58 yo male with multiple chronic medical conditions including but not limited to, diabetes, history of CVA, carotid stenosis, atrial fibrillation, COPD, large recurrent ventral hernia, anemia, hypertension, mental retardation, GERD, and tobacco abuse in remission who is seen in office followup. He was seen by Nicoletta Ba, PA-C approximately 2 months ago to consider colorectal cancer screening. At that time Cologuard was ordered, but canceled by his primary group of physicians due to "the test not being covered". The patient is not aware of this issue. At the time Nicoletta Ba, PA-C [facility] colonoscopy would be difficult if not impossible due to his large ventral hernia, comorbidities, and chronic anticoagulation. At the time she was unaware of his reported iron deficiency anemia. He returns today reporting he is feeling well. He is bothered by his large ventral hernia and reports he feels pressure in his abdomen with the hernia but no pain. He reports regular bowel movements without blood or melena. He does report scant red blood with wiping on occasion which he attributed to hemorrhoids. He reports he is eating well without dysphagia or odynophagia. He denies nausea or vomiting. He also denies heartburn but does take omeprazole 20 mg daily.  He lives with his sister who helps care for him and [place] [facility].  He reports he has never had a colonoscopy or endoscopy. He denies family history of colon cancer. He has had his ventral hernia repaired before but it recurred. He recalls being told he would need to stop smoking before any hernia repair to be considered. He has been tobacco free for 4 years.  Past Medical History  Diagnosis Date  . Hypertension   . Diabetes mellitus   . Mental retardation     Sister helps to take care of him and takes him to appts  . Tobacco user     Smokes  1ppd for multiple years.  Quit after hosp 09/2010.  Marland Kitchen Anemia     History of Iron Def Anemia  . Anxiety   . Hyperlipidemia   . Atrial fibrillation 06/2009    CHADS score 2 (HTN, DM), was not on coumadin, but now on Pradaxa for Afib  . Lung nodule   . Chronic low back pain   . CVA (cerebral infarction) 09/2010    Bilateral with Left > Right  . Abdominal hernia     Chronic, not a good surgical candidate  . Carotid artery occlusion   . Obesity   . COPD (chronic obstructive pulmonary disease)   . Abscess, abdomen 12/31/2010    Referred to Wound Care in 01/2011 because of multiple abd abscess with VERY large ventral hernia (please look at image of CT abd/pelvis 09/2010).  Because of hernia I was hesitant to I&D.      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 Stroke     right hand numbness  . GERD (gastroesophageal reflux disease)   . Pneumonia     hx of  . Shortness of breath     with ambulation  . Edema of both legs     takes Lasix  . Numbness and tingling in hands   . Itching     all over body; pt scratches and has sores on bilateral arms and abdomen  . PONV (postoperative nausea and vomiting)   . Mental retardation  Past Surgical History  Procedure Laterality Date  . Carotid arteriogram  10/2010    30% right ICA stenosis, 40% left ICA stenosis   . Transesophageal echocardiogram  09/2010    No ASD or PFO. EF 60-65%.  Normal systolic function. No evidence of thrombus.   . Transthoracic echocardiogram  09/2010     The cavity size was normal. Systolic function was vigorous.  EF 65-70%.  Normal wall funciton.   . Debridment of decubitus ulcer Right 02/13/2013  . I&d extremity Right 02/13/2013    Procedure: IRRIGATION AND DEBRIDEMENT FOOT ULCER;  Surgeon: Johnny Bridge, MD;  Location: [place] [facility];  Service: Orthopedics;  Laterality: Right;  PULSE LAVAGE  . Cataract extraction, bilateral    . Arm surgery Left     as a child  . Amputation Right 03/29/2013     Procedure: AMPUTATION RAY;  Surgeon: Newt Minion, MD;  Location: [facility];  Service: Orthopedics;  Laterality: Right;  Right Foot 3rd and Possible 4th Ray Amputation  . Multiple tooth extractions    . Amputation Right 04/23/2013    Procedure: AMPUTATION RIGHT MID-FOOT;  Surgeon: Newt Minion, MD;  Location: [facility];  Service: Orthopedics;  Laterality: Right;    Current Outpatient Prescriptions  Medication Sig Dispense Refill  . aspirin EC 81 MG EC tablet Take 1 tablet (81 mg total) by mouth daily.  90 tablet  3  . atorvastatin (LIPITOR) 40 MG tablet Take 40 mg by mouth daily.      . dabigatran (PRADAXA) 150 MG CAPS Take 150 mg by mouth every 12 (twelve) hours.      Marland Kitchen diltiazem (CARDIZEM CD) 240 MG 24 hr capsule Take 240 mg by mouth daily.      . ferrous gluconate (FERGON) 325 MG tablet Take 325 mg by mouth 2 (two) times daily.      . furosemide (LASIX) 40 MG tablet Take 1 tablet (40 mg total) by mouth daily.  30 tablet  2  . gabapentin (NEURONTIN) 300 MG capsule Take 300 mg by mouth 2 (two) times daily.      Marland Kitchen glucose blood test strip Use as instructed  100 each  3  . HYDROcodone-acetaminophen (NORCO) 5-325 MG per tablet Take 1 tablet by mouth every 6 (six) hours as needed for pain.  30 tablet  0  . hydrocortisone 2.5 % ointment Apply topically 3 (three) times daily. To leg around wound. Do not apply to broken skin.  30 g  0  . insulin glargine (LANTUS) 100 UNIT/ML injection Inject 60 Units into the skin at bedtime.      . Lancets (ONETOUCH ULTRASOFT) lancets Use as instructed  100 each  3  . metFORMIN (GLUCOPHAGE) 1000 MG tablet Take 1,000 mg by mouth 2 (two) times daily with a meal.      . metoprolol tartrate (LOPRESSOR) 25 MG tablet Take 25 mg by mouth 2 (two) times daily.      . nitroGLYCERIN (NITROSTAT) 0.4 MG SL tablet Place 0.4 mg under the tongue every 5 (five) minutes as needed for chest pain.       Marland Kitchen omeprazole (PRILOSEC) 20 MG capsule Take 20 mg by mouth daily.      Marland Kitchen PARoxetine  (PAXIL) 40 MG tablet Take 40 mg by mouth every morning.      . Vitamin D, Ergocalciferol, (DRISDOL) 50000 UNITS CAPS capsule Take 50,000 Units by mouth every 7 (seven) days.       No current facility-administered medications for  this visit.    Allergies  Allergen Reactions  . Penicillins Hives and Nausea And Vomiting    Family History  Problem Relation Age of Onset  . Heart disease Mother   . Hypertension Sister   . Heart disease Sister   . Heart disease Brother   . Heart disease Father   . Peripheral vascular disease Father   . Heart disease Maternal Grandmother   . Heart attack Maternal Grandmother   . Colon cancer Neg Hx   . Breast cancer Maternal Grandmother   . Stomach cancer Maternal Uncle     History  Substance Use Topics  . Smoking status: Former Smoker -- 1.00 packs/day for 42 years    Types: Cigarettes    Quit date: 11/10/2010  . Smokeless tobacco: Former Systems developer    Quit date: 10/02/2010  . Alcohol Use: Yes     Comment: rare    ROS: As per history of present illness, otherwise negative  BP 124/76  Pulse 84  Ht 5' (1.524 m)  Wt 230 lb 12.8 oz (104.69 kg)  BMI 45.07 kg/m2 Constitutional: Somewhat disheveled, but pleasant male in no acute distress HEENT: Normocephalic and atraumatic.No oropharyngeal exudate. Conjunctivae are normal.  No scleral icterus. Neck: Neck supple.  Cardiovascular: Normal rate, regular rhythm and intact distal pulses. No M/R/G Pulmonary/chest: Effort normal and breath sounds normal. No wheezing, rales or rhonchi. Abdominal: Soft, massive ventral hernia centered in the right lower abdomen, nontender, Bowel sounds active throughout.  Rectal:  Small external hemorrhoids with skin tags, no rectal masses, soft brown stool in the rectal vault, heme-negative Extremities: no clubbing, cyanosis, trace edema Neurological: Alert and oriented to person place and time. Skin: Skin is warm and dry. Several superficial skin abrasions over the hernia  without fluctuance, drainage or evidence of overt cellulitis Psychiatric: Normal mood and affect. Behavior is normal.  RELEVANT LABS AND IMAGING: CBC    Component Value Date/Time   WBC 8.8 04/23/2013 1421   RBC 3.98* 04/23/2013 1421   HGB 9.2* 04/23/2013 1421   HCT 28.6* 04/23/2013 1421   PLT 369 04/23/2013 1421   MCV 71.9* 04/23/2013 1421   MCH 23.1* 04/23/2013 1421   MCHC 32.2 04/23/2013 1421   RDW 15.6* 04/23/2013 1421   LYMPHSABS 2.3 02/13/2013 1620   MONOABS 1.3* 02/13/2013 1620   EOSABS 0.3 02/13/2013 1620   BASOSABS 0.1 02/13/2013 1620    CMP     Component Value Date/Time   NA 131* 04/23/2013 1421   K 5.1 04/23/2013 1421   CL 96 04/23/2013 1421   CO2 18* 04/23/2013 1421   GLUCOSE 152* 04/23/2013 1421   BUN 17 04/23/2013 1421   CREATININE 0.88 04/23/2013 1421   CREATININE 1.35 12/13/2012 1554   CALCIUM 9.4 04/23/2013 1421   PROT 7.4 03/28/2013 1026   ALBUMIN 3.0* 03/28/2013 1026   AST 14 03/28/2013 1026   ALT 11 03/28/2013 1026   ALKPHOS 78 03/28/2013 1026   BILITOT 0.2* 03/28/2013 1026   GFRNONAA >90 04/23/2013 1421   GFRAA >90 04/23/2013 1421   CT ABDOMEN AND PELVIS WITH CONTRAST -- 2012   Technique:  Multidetector CT imaging of the abdomen and pelvis was performed following the standard protocol during bolus administration of intravenous contrast.   Contrast: 100 ml Omnipaque-300.   Comparison: None.   Findings: Dependent atelectasis is present in the lungs.  Trace right pleural effusion.  Fatty liver is present.  Coronary artery atherosclerosis is present. If office based assessment of coronary risk factors  has not been performed, it is now recommended. Gallbladder appears within normal limits.   There is a large ventral hernia with a wide mouth containing loops of colon and small bowel.  No obstructive features are identified. Normal renal enhancement.  Normal delayed excretion of renal contrast bilaterally. Mild nonspecific perinephric stranding is present bilaterally,  slightly greater on the right than left. Abdominal aortic atherosclerosis.  The stomach and duodenum appear within normal limits.  Spleen is unremarkable.  No free fluid in the anatomic pelvis.  Foley catheter in the bladder.  Rectum and sigmoid appear within normal limits.  Sigmoid is extra abdominal, contained within the large ventral hernia.  The full extent of small bowel is not seen, out of the field of view because of the large ventral hernia.  There is some motion artifact on the examination in the anatomic pelvis with step-off deformity.  Lumbar spondylosis and scoliosis.   IMPRESSION: 1.  No acute abdominal abnormality. 2.  Fatty liver. 3.  Trace right pleural effusion with bilateral basilar atelectasis. 4.  Atherosclerosis and coronary artery disease. 5.  Large wide-mouth ventral hernia without complicating features.  ASSESSMENT/PLAN: 58 yo male with multiple chronic medical conditions including but not limited to, diabetes, history of CVA, carotid stenosis, atrial fibrillation, COPD, large recurrent ventral hernia, anemia, hypertension, mental retardation, GERD, and tobacco abuse in remission who is seen in office followup  1. ?IDA -- labs from 9 months ago he had evidence of microcytic anemia, though I'm not sure this is persistent. His stool is heme-negative today. I am repeating CBC with iron studies today. If he is found to be iron deficient then upper endoscopy and colonoscopy would be very appropriate, see below regarding colonoscopy.  2.  CRC screening -- question of anemia needs to be answered, see #1. Colonoscopy could potentially be very difficult given his extremely large ventral hernia containing sigmoid colon by imaging from 2012. It is very possible that colonoscopy would be incomplete given the size of the hernia. Also if performed his anticoagulant would need to be held. We would need permission from his prescribing physician to hold this medicine if we pursue  colonoscopy.  3.  Large ventral hernia -- I would like him to be seen by a surgeon locally to discuss any options for repair of his ventral hernia. It does bother him and he requested this referral. This may be technically difficult if not impossible but I will leave this to the expertise of the surgeon. Certainly with his medical comorbidities and need for anticoagulation, the surgery if performed, would have to be coordinated with his cardiologist.  I have asked to see him back in one month, after he visits with the surgeon to discuss the plan further

## 2014-01-23 ENCOUNTER — Ambulatory Visit (INDEPENDENT_AMBULATORY_CARE_PROVIDER_SITE_OTHER): Payer: Medicaid Other | Admitting: General Surgery

## 2014-02-10 ENCOUNTER — Telehealth: Payer: Self-pay

## 2014-02-10 NOTE — Telephone Encounter (Signed)
Received call from [facility] NP with [facility]. Dr. Hilarie Fredrickson wanted the pt referred to surgeon for large ventral hernia. They have not approved the visit because they felt the visit was futile because the hernia has been repaired twice and pt is a poor surgical risk. Wanted to make sure Dr. Hilarie Fredrickson was aware. Dr. Hilarie Fredrickson notified.

## 2014-02-10 NOTE — Telephone Encounter (Signed)
Noted I will see him later this month to discuss endoscopic procedures and eval of his iron def anemia

## 2014-02-10 NOTE — Telephone Encounter (Signed)
Spoke with [name] NP and she is aware. Her contact number is [phone].

## 2014-02-27 ENCOUNTER — Ambulatory Visit: Payer: Medicaid Other | Admitting: Internal Medicine

## 2014-04-29 ENCOUNTER — Ambulatory Visit (INDEPENDENT_AMBULATORY_CARE_PROVIDER_SITE_OTHER): Payer: Medicare (Managed Care) | Admitting: Internal Medicine

## 2014-04-29 ENCOUNTER — Encounter: Payer: Self-pay | Admitting: Internal Medicine

## 2014-04-29 VITALS — BP 112/60 | HR 68 | Ht 60.0 in | Wt 230.2 lb

## 2014-04-29 DIAGNOSIS — Z7901 Long term (current) use of anticoagulants: Secondary | ICD-10-CM

## 2014-04-29 DIAGNOSIS — D509 Iron deficiency anemia, unspecified: Secondary | ICD-10-CM

## 2014-04-29 DIAGNOSIS — K432 Incisional hernia without obstruction or gangrene: Secondary | ICD-10-CM

## 2014-04-29 MED ORDER — MOVIPREP 100 G PO SOLR: 1.0000 | Freq: Once | ORAL | Status: DC

## 2014-04-29 NOTE — Patient Instructions (Signed)
You have been scheduled for a colonoscopy and Endoscoopy. Please follow written instructions given to you at your visit today.  Please pick up your prep kit at the pharmacy within the next 1-3 days. If you use inhalers (even only as needed), please bring them with you on the day of your procedure. Your physician has requested that you go to www.startemmi.com and enter the access code given to you at your visit today. This web site gives a general overview about your procedure. However, you should still follow specific instructions given to you by our office regarding your preparation for the procedure. Your physician has requested that you go to the basement for  lab work before leaving today. CC:  Barney Drain MD

## 2014-04-29 NOTE — Progress Notes (Signed)
Subjective:    Patient ID: [name], male    DOB: [birth year], 58 y.o.   MRN: [id number]  HPI [name] is a 58 year old male with multiple chronic medical conditions including but not limited to diabetes, history of stroke, carotid stenosis, atrial fibrillation, COPD, large recurrent ventral hernia, iron deficiency anemia, hypertension, mental retardation, GERD, and tobacco abuse in remission who is seen for followup. I saw him in June 2015 at which point there was a question of iron deficiency. He was heme negative in the office that day. He also was referred to Gen. surgery to consider repeat repair of large ventral hernia which bothers him. Surgery felt like repair was impossible and the appointment was never scheduled. He reports that he continues to have pressure and discomfort from the large hernia but no nausea, vomiting or severe pain. He reports his bowel movements are regular but he will see scant red blood on occasion with wiping. He reports he is eating well without dysphagia or odynophagia. No nausea or vomiting. No heartburn but he continues with omeprazole 20 mg daily. He does take Pradaxa every 12 hours.  No prior GI procedure or family history of colorectal cancer.   Review of Systems As per history of present illness, otherwise negative  Current Medications, Allergies, Past Medical History, Past Surgical History, Family History and Social History were reviewed in [employer] record.     Objective:   Physical Exam BP 112/60  Pulse 68  Ht 5' (1.524 m)  Wt 230 lb 3.2 oz (104.418 kg)  BMI 44.96 kg/m2 Constitutional: pleasant male in no acute distress  HEENT: Normocephalic and atraumatic.No oropharyngeal exudate. Conjunctivae are normal. No scleral icterus.  Neck: Neck supple.  Cardiovascular: Normal rate, regular rhythm and intact distal pulses. No M/R/G  Pulmonary/chest: Effort normal and breath sounds normal. No wheezing, rales or  rhonchi.  Abdominal: Soft, massive ventral hernia centered in the right lower abdomen, nontender, Bowel sounds active throughout.  Extremities: no clubbing, cyanosis, trace edema  Psychiatric: Normal mood and affect. Behavior is normal.  CBC    Component Value Date/Time   WBC 10.2 01/09/2014 1102   RBC 4.41 01/09/2014 1102   HGB 11.5* 01/09/2014 1102   HCT 35.1* 01/09/2014 1102   PLT 254.0 01/09/2014 1102   MCV 79.7 01/09/2014 1102   MCH 23.1* 04/23/2013 1421   MCHC 32.7 01/09/2014 1102   RDW 16.2* 01/09/2014 1102   LYMPHSABS 2.3 02/13/2013 1620   MONOABS 1.3* 02/13/2013 1620   EOSABS 0.3 02/13/2013 1620   BASOSABS 0.1 02/13/2013 1620    Iron/TIBC/Ferritin/ %Sat    Component Value Date/Time   IRON 31* 01/09/2014 1102   TIBC 324 12/13/2012 1554   FERRITIN 39.7 01/09/2014 1102   IRONPCTSAT 11.0* 01/09/2014 1102   IRONPCTSAT 8* 12/13/2012 1554      Assessment & Plan:  58 year old male with multiple chronic medical conditions including but not limited to diabetes, history of stroke, carotid stenosis, atrial fibrillation, COPD, large recurrent ventral hernia, iron deficiency anemia, hypertension, mental retardation, GERD, and tobacco abuse in remission who is seen for followup.  1. IDA -- labs do indicate iron deficiency anemia and for this reason I have recommended upper endoscopy and colonoscopy. Will plan to repeat CBC and iron studies today along with celiac panel. Colonoscopy may be quite difficult with large ventral hernia, but given iron deficiency it is felt necessary to at least attempt a full colonoscopy. We discussed this at length today and he  understands with this hernia colonoscopy may be difficult if not impossible to reach the cecum. If the cecum is not able to be reached he would need another test to be a barium enema or CT colonoscopy to exclude a large lesion. I do not think he is bleeding rapidly or overtly especially in the setting of anticoagulation and lack of visible blood. Procedures will be  scheduled with monitored anesthesia care in hospital setting. We will seek permission to hold anticoagulant from his prescribing physician.  2. Large ventral hernia -- surgery was felt to be "futile" and thus a point was not scheduled. He is disappointed in this and would really like it to be repaired. Options include tertiary referral though given it is recurrent and overall body habitus, repair may indeed be impossible. No evidence for obstruction.

## 2014-04-30 ENCOUNTER — Telehealth: Payer: Self-pay

## 2014-04-30 NOTE — Telephone Encounter (Signed)
Letter received to hold the pradaxa for 2 days prior to the procedure. Pt aware and letter to be scanned into EPIC

## 2014-06-10 ENCOUNTER — Encounter (HOSPITAL_COMMUNITY): Payer: Self-pay | Admitting: *Deleted

## 2014-06-10 ENCOUNTER — Ambulatory Visit (HOSPITAL_COMMUNITY): Payer: Medicare (Managed Care) | Admitting: Anesthesiology

## 2014-06-10 ENCOUNTER — Encounter (HOSPITAL_COMMUNITY)
Admission: RE | Disposition: A | Discharge status: Home / Self Care | Payer: Self-pay | Source: Ambulatory Visit | Attending: Internal Medicine

## 2014-06-10 ENCOUNTER — Ambulatory Visit (HOSPITAL_COMMUNITY)
Admission: RE | Admit: 2014-06-10 | Discharge: 2014-06-10 | Disposition: A | Discharge status: Home / Self Care | Payer: Medicare (Managed Care) | Source: Ambulatory Visit | Attending: Internal Medicine | Admitting: Internal Medicine

## 2014-06-10 DIAGNOSIS — I739 Peripheral vascular disease, unspecified: Secondary | ICD-10-CM | POA: Insufficient documentation

## 2014-06-10 DIAGNOSIS — E119 Type 2 diabetes mellitus without complications: Secondary | ICD-10-CM | POA: Insufficient documentation

## 2014-06-10 DIAGNOSIS — K432 Incisional hernia without obstruction or gangrene: Secondary | ICD-10-CM | POA: Insufficient documentation

## 2014-06-10 DIAGNOSIS — Z794 Long term (current) use of insulin: Secondary | ICD-10-CM | POA: Diagnosis not present

## 2014-06-10 DIAGNOSIS — K2950 Unspecified chronic gastritis without bleeding: Secondary | ICD-10-CM

## 2014-06-10 DIAGNOSIS — I10 Essential (primary) hypertension: Secondary | ICD-10-CM | POA: Diagnosis not present

## 2014-06-10 DIAGNOSIS — Z6841 Body Mass Index (BMI) 40.0 and over, adult: Secondary | ICD-10-CM | POA: Insufficient documentation

## 2014-06-10 DIAGNOSIS — F419 Anxiety disorder, unspecified: Secondary | ICD-10-CM | POA: Insufficient documentation

## 2014-06-10 DIAGNOSIS — D509 Iron deficiency anemia, unspecified: Secondary | ICD-10-CM | POA: Diagnosis present

## 2014-06-10 DIAGNOSIS — K219 Gastro-esophageal reflux disease without esophagitis: Secondary | ICD-10-CM | POA: Insufficient documentation

## 2014-06-10 DIAGNOSIS — Z8673 Personal history of transient ischemic attack (TIA), and cerebral infarction without residual deficits: Secondary | ICD-10-CM | POA: Insufficient documentation

## 2014-06-10 DIAGNOSIS — I6529 Occlusion and stenosis of unspecified carotid artery: Secondary | ICD-10-CM | POA: Diagnosis not present

## 2014-06-10 DIAGNOSIS — Z87891 Personal history of nicotine dependence: Secondary | ICD-10-CM | POA: Diagnosis not present

## 2014-06-10 DIAGNOSIS — K6289 Other specified diseases of anus and rectum: Secondary | ICD-10-CM | POA: Diagnosis not present

## 2014-06-10 DIAGNOSIS — I4891 Unspecified atrial fibrillation: Secondary | ICD-10-CM | POA: Diagnosis not present

## 2014-06-10 DIAGNOSIS — J449 Chronic obstructive pulmonary disease, unspecified: Secondary | ICD-10-CM | POA: Diagnosis not present

## 2014-06-10 DIAGNOSIS — K2990 Gastroduodenitis, unspecified, without bleeding: Secondary | ICD-10-CM

## 2014-06-10 DIAGNOSIS — D122 Benign neoplasm of ascending colon: Secondary | ICD-10-CM | POA: Insufficient documentation

## 2014-06-10 DIAGNOSIS — K2970 Gastritis, unspecified, without bleeding: Secondary | ICD-10-CM

## 2014-06-10 HISTORY — PX: COLONOSCOPY WITH PROPOFOL: SHX5780

## 2014-06-10 HISTORY — PX: ESOPHAGOGASTRODUODENOSCOPY (EGD) WITH PROPOFOL: SHX5813

## 2014-06-10 LAB — GLUCOSE, CAPILLARY: Glucose-Capillary: 107 mg/dL — ABNORMAL HIGH (ref 70–99)

## 2014-06-10 SURGERY — ESOPHAGOGASTRODUODENOSCOPY (EGD) WITH PROPOFOL
Anesthesia: Monitor Anesthesia Care

## 2014-06-10 MED ORDER — SODIUM CHLORIDE 0.9 % IV SOLN: INTRAVENOUS | Status: DC

## 2014-06-10 MED ORDER — PROPOFOL 10 MG/ML IV BOLUS
INTRAVENOUS | Status: AC
  Filled 2014-06-10: qty 20

## 2014-06-10 MED ORDER — FENTANYL CITRATE 0.05 MG/ML IJ SOLN: 25.0000 ug | INTRAMUSCULAR | Status: DC | PRN

## 2014-06-10 MED ORDER — PROPOFOL INFUSION 10 MG/ML OPTIME
INTRAVENOUS | Status: DC | PRN
  Administered 2014-06-10: 300 ug/kg/min via INTRAVENOUS

## 2014-06-10 MED ORDER — LACTATED RINGERS IV SOLN
INTRAVENOUS | Status: DC
  Administered 2014-06-10: 1000 mL via INTRAVENOUS

## 2014-06-10 SURGICAL SUPPLY — 25 items

## 2014-06-10 NOTE — Anesthesia Postprocedure Evaluation (Signed)
  Anesthesia Post-op Note  Patient: [name]  Procedure(s) Performed: Procedure(s): ESOPHAGOGASTRODUODENOSCOPY (EGD) WITH PROPOFOL (N/A) COLONOSCOPY WITH PROPOFOL (N/A)  Patient Location: PACU  Anesthesia Type:MAC  Level of Consciousness: awake and alert   Airway and Oxygen Therapy: Patient Spontanous Breathing  Post-op Pain: none  Post-op Assessment: Post-op Vital signs reviewed, Patient's Cardiovascular Status Stable, Respiratory Function Stable, Patent Airway, No signs of Nausea or vomiting and Pain level controlled  Post-op Vital Signs: Reviewed and stable  Last Vitals:  Filed Vitals:   06/10/14 0920  BP: 139/77  Pulse: 70  Temp:   Resp: 18    Complications: No apparent anesthesia complications

## 2014-06-10 NOTE — Discharge Instructions (Signed)
Okay to resume Pradaxa with normal dose TOMORROW, Jun 11, 2014   YOU HAD AN ENDOSCOPIC PROCEDURE TODAY: Refer to the procedure report that was given to you for any specific questions about what was found during the examination.  If the procedure report does not answer your questions, please call your gastroenterologist to clarify.  YOU SHOULD EXPECT: Some feelings of bloating in the abdomen. Passage of more gas than usual.  Walking can help get rid of the air that was put into your GI tract during the procedure and reduce the bloating. If you had a lower endoscopy (such as a colonoscopy or flexible sigmoidoscopy) you may notice spotting of blood in your stool or on the toilet paper.   DIET: Your first meal following the procedure should be a light meal and then it is ok to progress to your normal diet.  A half-sandwich or bowl of soup is an example of a good first meal.  Heavy or fried foods are harder to digest and may make you feel nasueas or bloated.  Drink plenty of fluids but you should avoid alcoholic beverages for 24 hours.  ACTIVITY: Your care partner should take you home directly after the procedure.  You should plan to take it easy, moving slowly for the rest of the day.  You can resume normal activity the day after the procedure however you should NOT DRIVE or use heavy machinery for 24 hours (because of the sedation medicines used during the test).    SYMPTOMS TO REPORT IMMEDIATELY  A gastroenterologist can be reached at any hour.  Please call your doctor's office for any of the following symptoms:   Following lower endoscopy (colonoscopy, flexible sigmoidoscopy)  Excessive amounts of blood in the stool  Significant tenderness, worsening of abdominal pains  Swelling of the abdomen that is new, acute  Fever of 100 or higher  Following upper endoscopy (EGD, EUS, ERCP)  Vomiting of blood or coffee ground material  New, significant abdominal pain  New, significant chest pain or pain  under the shoulder blades  Painful or persistently difficult swallowing  New shortness of breath  Black, tarry-looking stools  FOLLOW UP: If any biopsies were taken you will be contacted by phone or by letter within the next 1-3 weeks.  Call your gastroenterologist if you have not heard about the biopsies in 3 weeks.  Please also call your gastroenterologist's office with any specific questions about appointments or follow up tests.  Upper Gastrointestinal Series An upper gastrointestinal (GI) series is a medical test with X-rays that helps diagnose problems of the upper GI tract. The upper GI tract includes the esophagus, stomach, and duodenum. The duodenum is the first part of the small intestine. This exam is used to look for heartburn, reflux, sores (ulcers), abnormal growths, and other problems. LET YOUR CAREGIVER KNOW ABOUT:  Allergies to food or medicine.  Medicines taken, including vitamins, herbs, eyedrops, over-the-counter medicines, and creams.  Previous problems with barium.  Any constipation problems.  Breastfeeding, if this applies.  Possibility of pregnancy, if this applies. RISKS AND COMPLICATIONS Problems do not occur often with this procedure, but they are possible. Possible problems include:  An allergic reaction to barium (rare).  Nausea after drinking the barium.  Problems from the very small amount of radiation exposure.  Cramps or constipation. BEFORE THE PROCEDURE  Several days before the exam, you may need to change what you eat. Follow your caregiver's directions.  The night before the exam, do not eat  or drink anything after midnight.  Ask your caregiver if it is okay to take any needed medicines with a sip of water.  If you have diabetes and need to take insulin, ask for instructions on how to do this before the exam. PROCEDURE An upper GI series is done while you are awake. You will not need pain medicine. The exam usually takes about 1 hour.  The length of the exam depends on how long it takes for the barium to move through your system. It also depends on what is found during the exam.   You will drink barium. This is like a milkshake that tastes like chalk. It might make you feel bloated. Barium helps the inside of the upper GI tract to show up clearly on the screen.  You may also swallow "fizzies." This is a substance that causes air to build up in your stomach.  A type of X-ray called fluoroscopy will be used. You may need to stand up or lie on a table. The table may move or tilt. You may need to turn from side to side. This is done to get pictures from different angles.  The exam is done when all the needed pictures have been taken. AFTER THE PROCEDURE  You may go back to your normal diet and activities right away.  Ask when your test results will be ready. Follow up with your caregiver to discuss your test results. Document Released: 07/22/2000 Document Revised: 10/17/2011 Document Reviewed: 06/06/2011 [facility] Patient Information 2015 [place], [place]. This information is not intended to replace advice given to you by your health care provider. Make sure you discuss any questions you have with your health care provider.

## 2014-06-10 NOTE — H&P (Signed)
HPI [name] is a 58 year old male with multiple chronic medical conditions including but not limited to diabetes, history of stroke, carotid stenosis, atrial fibrillation, COPD, large recurrent ventral hernia, iron deficiency anemia, hypertension, mental retardation, GERD, and tobacco abuse in remission who is here for ECL. I saw him in June 2015 at which point there was a question of iron deficiency. He was heme negative in the office that day. He also was referred to Gen. surgery to consider repeat repair of large ventral hernia which bothers him. Surgery felt like repair was impossible and the appointment was never scheduled. He reports that he continues to have pressure and discomfort from the large hernia but no nausea, vomiting or severe pain. He reports his bowel movements are regular but he will see scant red blood on occasion with wiping. He reports he is eating well without dysphagia or odynophagia. No nausea or vomiting. No heartburn but he continues with omeprazole 20 mg daily. He does take Pradaxa every 12 hours, last dose was Nov 1 in the am (stopped 48 hours ago now).  No prior GI procedure or family history of colorectal cancer.   Review of Systems As per history of present illness, otherwise negative  Current Medications, Allergies, Past Medical History, Past Surgical History, Family History and Social History were reviewed in [employer] record.     Objective:   Physical Exam BP 182/74 mmHg  Pulse 64  Temp(Src) 98.2 F (36.8 C) (Oral)  Resp 15  Ht 5' (1.524 m)  Wt 230 lb (104.327 kg)  BMI 44.92 kg/m2  SpO2 96% Gen: awake, alert, NAD HEENT: anicteric, op clear CV: RRR, no mrg Pulm: CTA b/l Abd: soft, large ventral hernia, NT/ND, +BS throughout Ext: no c/c/e Neuro: nonfocal .   CBC  Labs (Brief)       Component Value Date/Time   WBC 10.2 01/09/2014 1102   RBC 4.41 01/09/2014 1102   HGB 11.5* 01/09/2014 1102   HCT 35.1*  01/09/2014 1102   PLT 254.0 01/09/2014 1102   MCV 79.7 01/09/2014 1102   MCH 23.1* 04/23/2013 1421   MCHC 32.7 01/09/2014 1102   RDW 16.2* 01/09/2014 1102   LYMPHSABS 2.3 02/13/2013 1620   MONOABS 1.3* 02/13/2013 1620   EOSABS 0.3 02/13/2013 1620   BASOSABS 0.1 02/13/2013 1620      Iron/TIBC/Ferritin/ %Sat  Labs (Brief)       Component Value Date/Time   IRON 31* 01/09/2014 1102   TIBC 324 12/13/2012 1554   FERRITIN 39.7 01/09/2014 1102   IRONPCTSAT 11.0* 01/09/2014 1102   IRONPCTSAT 8* 12/13/2012 1554        Assessment & Plan:  58 year old male with multiple chronic medical conditions including but not limited to diabetes, history of stroke, carotid stenosis, atrial fibrillation, COPD, large recurrent ventral hernia, iron deficiency anemia, hypertension, mental retardation, GERD, and tobacco abuse in remission who is here for endo/colon.  1. IDA -- labs do indicate iron deficiency anemia and for this reason I have recommended upper endoscopy and colonoscopy.The nature of the procedure, as well as the risks, benefits, and alternatives were carefully and thoroughly reviewed with the patient. Ample time for discussion and questions allowed. The patient understood, was satisfied, and agreed to proceed. He is aware the ventral hernia may make complete colonoscopy difficult if not impossible and if so, another method to imaging the remaining colon may be needed. Pradaxa is appropriately on hold.

## 2014-06-10 NOTE — Op Note (Signed)
[facility] [place], [address]   COLONOSCOPY PROCEDURE REPORT  PATIENT: [name], [name]  MR#: [id number] BIRTHDATE: August 12, 1955 , 77  yrs. old GENDER: male ENDOSCOPIST: Jerene Bears, MD REFERRED OJ:[id number] Bradd Burner, M.D. PROCEDURE DATE:  06/10/2014 PROCEDURE:   Colonoscopy with cold biopsy polypectomy First Screening Colonoscopy - Avg.  risk and is 50 yrs.  old or older - No.  Prior Negative Screening - Now for repeat screening. N/A  History of Adenoma - Now for follow-up colonoscopy & has been > or = to 3 yrs.  N/A  Polyps Removed Today? Yes. ASA CLASS:   Class III INDICATIONS:iron deficiency anemia. MEDICATIONS: Monitored anesthesia care and Per Anesthesia  DESCRIPTION OF PROCEDURE:   After the risks benefits and alternatives of the procedure were thoroughly explained, informed consent was obtained.  The digital rectal exam revealed several skin tags and no rectal masses.   The Pentax Adult Colonoscope [id number]  endoscope was introduced through the anus and advanced to the terminal ileum which was intubated for a short distance. No adverse events experienced.   The quality of the prep was good, using MoviPrep  The instrument was then slowly withdrawn as the colon was fully examined.  COLON FINDINGS: The examined terminal ileum appeared to be normal. Two sessile polyps ranging between 3-13mm in size were found in the ascending colon.  Polypectomies were performed with cold forceps. The resection was complete, the polyp tissue was completely retrieved and sent to histology.   The examination was otherwise normal.  Retroflexed views revealed operative hypertrophied anal papillae.      The scope was withdrawn and the procedure completed. COMPLICATIONS: There were no immediate complications.  ENDOSCOPIC IMPRESSION: 1.   The examined terminal ileum appeared to be normal 2.   Two sessile polyps ranging between 3-51mm in size were found in the ascending  colon; polypectomies were performed with cold forceps  3.   The examination was otherwise normal  RECOMMENDATIONS: 1.  Await pathology results 2.  Timing of repeat colonoscopy will be determined by pathology findings. 3.  You will receive a letter within 1-2 weeks with the results of your biopsy as well as final recommendations.  Please call my office if you have not received a letter after 3 weeks. 4.  If pathology results from EGD unrevealing as to cause of IDA, then would proceed to video capsule endoscopy to evaluate for small bowel source of anemia.  eSigned:  Jerene Bears, MD 06/10/2014 9:08 AM cc: Barney Drain, MD and The Patient

## 2014-06-10 NOTE — Transfer of Care (Signed)
Immediate Anesthesia Transfer of Care Note  Patient: [name]  Procedure(s) Performed: Procedure(s): ESOPHAGOGASTRODUODENOSCOPY (EGD) WITH PROPOFOL (N/A) COLONOSCOPY WITH PROPOFOL (N/A)  Patient Location: PACU and Endoscopy Unit  Anesthesia Type:MAC  Level of Consciousness: awake, alert , oriented and patient cooperative  Airway & Oxygen Therapy: Patient Spontanous Breathing and Patient connected to nasal cannula oxygen  Post-op Assessment: Report given to PACU RN and Post -op Vital signs reviewed and stable  Post vital signs: Reviewed and stable  Complications: No apparent anesthesia complications

## 2014-06-10 NOTE — Op Note (Signed)
[facility] [place], [address]   ENDOSCOPY PROCEDURE REPORT  PATIENT: [name], [name]  MR#: [id number] BIRTHDATE: Oct 23, 1955 , 32  yrs. old GENDER: male ENDOSCOPIST: Jerene Bears, MD REFERRED BY:  Barney Drain, M.D. PROCEDURE DATE:  06/10/2014 PROCEDURE:  EGD w/ biopsy ASA CLASS:     Class III INDICATIONS:  iron deficiency anemia. MEDICATIONS: Per Anesthesia and Monitored anesthesia care TOPICAL ANESTHETIC: none  DESCRIPTION OF PROCEDURE: After the risks benefits and alternatives of the procedure were thoroughly explained, informed consent was obtained.  The Pentax Gastroscope [id number] endoscope was introduced through the mouth and advanced to the second portion of the duodenum , Without limitations.  The instrument was slowly withdrawn as the mucosa was fully examined.  ESOPHAGUS: The mucosa of the esophagus appeared normal.  STOMACH: Mild gastritis (inflammation) was found in the gastric antrum and gastric fundus.  Cold forcep biopsies were taken at the gastric body, antrum and angularis to evaluate for h.  pylori.  DUODENUM: The duodenal mucosa showed no abnormalities.  Cold forceps biopsies were taken in the bulb and second portion.  Retroflexed views revealed no abnormalities.     The scope was then withdrawn from the patient and the procedure completed.  COMPLICATIONS: There were no immediate complications.  ENDOSCOPIC IMPRESSION: 1.   The mucosa of the esophagus appeared normal 2.   Mild gastritis (inflammation) was found in the gastric antrum and gastric fundus; multiple biopsies 3.   The duodenal mucosa showed no abnormalities cold forceps biopsies were taken in the bulb and second portion  RECOMMENDATIONS: 1.  Await biopsy results 2.  Follow-up of helicobacter pylori status, treat if indicated 3.  Proceed with colonoscopy  eSigned:  Jerene Bears, MD 06/10/2014 8:54 AM    CC:The Patient and Barney Drain, MD

## 2014-06-10 NOTE — Anesthesia Preprocedure Evaluation (Addendum)
Anesthesia Evaluation  Patient identified by MRN, date of birth, ID band Patient awake    Reviewed: Allergy & Precautions, H&P , NPO status , Patient's Chart, lab work & pertinent test results  History of Anesthesia Complications (+) PONV and history of anesthetic complications  Airway Mallampati: III  TM Distance: >3 FB Neck ROM: Full    Dental  (+) Edentulous Upper, Edentulous Lower   Pulmonary shortness of breath, neg sleep apnea, COPDneg recent URI, former smoker,  breath sounds clear to auscultation        Cardiovascular hypertension, Pt. on medications - angina+ Peripheral Vascular Disease - Past MI and - CHF + dysrhythmias Atrial Fibrillation Rhythm:Regular     Neuro/Psych PSYCHIATRIC DISORDERS Anxiety  Neuromuscular disease CVA, No Residual Symptoms    GI/Hepatic Neg liver ROS, GERD-  Medicated and Controlled,  Endo/Other  diabetes, Type 2, Insulin DependentMorbid obesity  Renal/GU      Musculoskeletal   Abdominal   Peds  Hematology  (+) Blood dyscrasia, anemia , On pradaxa on afib   Anesthesia Other Findings   Reproductive/Obstetrics                           Anesthesia Physical Anesthesia Plan  ASA: III  Anesthesia Plan: MAC   Post-op Pain Management:    Induction: Intravenous  Airway Management Planned: Simple Face Mask and Natural Airway  Additional Equipment: None  Intra-op Plan:   Post-operative Plan:   Informed Consent: I have reviewed the patients History and Physical, chart, labs and discussed the procedure including the risks, benefits and alternatives for the proposed anesthesia with the patient or authorized representative who has indicated his/her understanding and acceptance.     Plan Discussed with: CRNA and Surgeon  Anesthesia Plan Comments:         Anesthesia Quick Evaluation

## 2014-06-11 ENCOUNTER — Encounter: Payer: Self-pay | Admitting: Internal Medicine

## 2014-06-11 ENCOUNTER — Encounter (HOSPITAL_COMMUNITY): Payer: Self-pay | Admitting: Internal Medicine

## 2014-06-24 ENCOUNTER — Other Ambulatory Visit: Payer: Self-pay

## 2014-06-24 ENCOUNTER — Telehealth: Payer: Self-pay

## 2014-06-24 DIAGNOSIS — D509 Iron deficiency anemia, unspecified: Secondary | ICD-10-CM

## 2014-06-24 NOTE — Telephone Encounter (Signed)
Patient here for capsule endoscopy. Tolerated procedure. Verbalizes understanding of written and verbal instructions. Appointment is 07/01/14.

## 2014-06-30 ENCOUNTER — Telehealth: Payer: Self-pay | Admitting: *Deleted

## 2014-06-30 NOTE — Telephone Encounter (Signed)
Patient's BMI is 44.92. Cancelled SBCE here at office since our cutoff is BMI 43. We will reschedule at the hospital. Patient and his sister aware.

## 2014-07-01 ENCOUNTER — Telehealth: Payer: Self-pay | Admitting: Internal Medicine

## 2014-07-01 ENCOUNTER — Other Ambulatory Visit: Payer: Self-pay

## 2014-07-01 DIAGNOSIS — D508 Other iron deficiency anemias: Secondary | ICD-10-CM

## 2014-07-01 NOTE — Telephone Encounter (Signed)
Pt capsule endo rescheduled at [facility] 07/09/14@8am . Davy Pique will notify pt and his sister regarding appt and prep instructions. Prep instructions faxed to [facility] at [phone].

## 2014-07-09 ENCOUNTER — Ambulatory Visit (HOSPITAL_COMMUNITY)
Admission: RE | Admit: 2014-07-09 | Discharge: 2014-07-09 | Disposition: A | Discharge status: Home / Self Care | Payer: Medicare (Managed Care) | Source: Ambulatory Visit | Attending: Internal Medicine | Admitting: Internal Medicine

## 2014-07-09 ENCOUNTER — Ambulatory Visit (HOSPITAL_COMMUNITY)
Admission: RE | Admit: 2014-07-09 | Payer: Medicare (Managed Care) | Source: Ambulatory Visit | Admitting: Internal Medicine

## 2014-07-09 ENCOUNTER — Encounter (HOSPITAL_COMMUNITY)
Admission: RE | Disposition: A | Discharge status: Home / Self Care | Payer: Medicare (Managed Care) | Source: Ambulatory Visit | Attending: Internal Medicine

## 2014-07-09 DIAGNOSIS — K31819 Angiodysplasia of stomach and duodenum without bleeding: Secondary | ICD-10-CM | POA: Insufficient documentation

## 2014-07-09 DIAGNOSIS — K2970 Gastritis, unspecified, without bleeding: Secondary | ICD-10-CM | POA: Diagnosis not present

## 2014-07-09 DIAGNOSIS — Z86010 Personal history of colonic polyps: Secondary | ICD-10-CM | POA: Diagnosis not present

## 2014-07-09 DIAGNOSIS — D509 Iron deficiency anemia, unspecified: Secondary | ICD-10-CM | POA: Diagnosis not present

## 2014-07-09 HISTORY — PX: GIVENS CAPSULE STUDY: SHX5432

## 2014-07-09 SURGERY — IMAGING PROCEDURE, GI TRACT, INTRALUMINAL, VIA CAPSULE
Anesthesia: LOCAL

## 2014-07-09 SURGICAL SUPPLY — 1 items: TOWEL COTTON PACK 4EA (MISCELLANEOUS) ×4 IMPLANT

## 2014-07-10 ENCOUNTER — Encounter (HOSPITAL_COMMUNITY): Payer: Self-pay | Admitting: Internal Medicine

## 2014-07-16 ENCOUNTER — Encounter (HOSPITAL_COMMUNITY): Payer: Self-pay

## 2014-07-16 ENCOUNTER — Ambulatory Visit (HOSPITAL_COMMUNITY): Admit: 2014-07-16 | Payer: Self-pay | Admitting: Internal Medicine

## 2014-07-16 SURGERY — IMAGING PROCEDURE, GI TRACT, INTRALUMINAL, VIA CAPSULE
Anesthesia: LOCAL

## 2014-07-16 SURGICAL SUPPLY — 1 items: TOWEL COTTON PACK 4EA (MISCELLANEOUS) ×4 IMPLANT

## 2014-08-18 ENCOUNTER — Other Ambulatory Visit: Payer: Self-pay | Admitting: *Deleted

## 2014-08-18 ENCOUNTER — Ambulatory Visit
Admission: RE | Admit: 2014-08-18 | Discharge: 2014-08-18 | Disposition: A | Discharge status: Home / Self Care | Payer: No Typology Code available for payment source | Source: Ambulatory Visit | Attending: *Deleted | Admitting: *Deleted

## 2014-08-18 DIAGNOSIS — S91302D Unspecified open wound, left foot, subsequent encounter: Secondary | ICD-10-CM

## 2014-08-21 ENCOUNTER — Encounter (HOSPITAL_COMMUNITY): Payer: Self-pay | Admitting: Internal Medicine

## 2014-09-11 ENCOUNTER — Other Ambulatory Visit (HOSPITAL_COMMUNITY): Payer: Self-pay | Admitting: *Deleted

## 2014-09-11 ENCOUNTER — Ambulatory Visit (HOSPITAL_COMMUNITY)
Admission: RE | Admit: 2014-09-11 | Discharge: 2014-09-11 | Disposition: A | Discharge status: Home / Self Care | Payer: Medicare (Managed Care) | Source: Ambulatory Visit | Attending: Vascular Surgery | Admitting: Vascular Surgery

## 2014-09-11 DIAGNOSIS — Z8679 Personal history of other diseases of the circulatory system: Secondary | ICD-10-CM

## 2014-10-07 ENCOUNTER — Encounter: Payer: Self-pay | Admitting: Vascular Surgery

## 2014-10-08 ENCOUNTER — Encounter: Payer: Self-pay | Admitting: Vascular Surgery

## 2014-10-08 ENCOUNTER — Ambulatory Visit (INDEPENDENT_AMBULATORY_CARE_PROVIDER_SITE_OTHER): Payer: Medicare (Managed Care) | Admitting: Vascular Surgery

## 2014-10-08 VITALS — BP 130/61 | HR 64 | Resp 14 | Ht 60.0 in | Wt 242.0 lb

## 2014-10-08 DIAGNOSIS — I739 Peripheral vascular disease, unspecified: Secondary | ICD-10-CM

## 2014-10-08 DIAGNOSIS — I6523 Occlusion and stenosis of bilateral carotid arteries: Secondary | ICD-10-CM

## 2014-10-08 NOTE — Patient Instructions (Signed)
Carotid Artery Disease The carotid arteries are the two main arteries on either side of the neck that supply blood to the brain. Carotid artery disease, also called carotid artery stenosis, is the narrowing or blockage of one or both carotid arteries. Carotid artery disease increases your risk for a stroke or a transient ischemic attack (TIA). A TIA is an episode in which a waxy, fatty substance that accumulates within the artery (plaque) blocks blood flow to the brain. A TIA is considered a "warning stroke."  CAUSES   Buildup of plaque inside the carotid arteries (atherosclerosis) (common).  A weakened outpouching in an artery (aneurysm).  Inflammation of the carotid artery (arteritis).  A fibrous growth within the carotid artery (fibromuscular dysplasia).  Tissue death within the carotid artery due to radiation treatment (post-radiation necrosis).  Decreased blood flow due to spasms of the carotid artery (vasospasm).  Separation of the walls of the carotid artery (carotid dissection). RISK FACTORS  High cholesterol (dyslipidemia).   High blood pressure (hypertension).   Smoking.   Obesity.   Diabetes.   Family history of cardiovascular disease.   Inactivity or lack of regular exercise.   Being male. Men have an increased risk of developing atherosclerosis earlier in life than women.  SYMPTOMS  Carotid artery disease does not cause symptoms. DIAGNOSIS Diagnosis of carotid artery disease may include:   A physical exam. Your health care provider may hear an abnormal sound (bruit) when listening to the carotid arteries.   Specific tests that look at the blood flow in the carotid arteries. These tests include:   Carotid artery ultrasonography.   Carotid or cerebral angiography.   Computerized tomographic angiography (CTA).   Magnetic resonance angiography (MRA).  TREATMENT  Treatment of carotid artery disease can include a combination of treatments.  Treatment options include:  Surgery. You may have:   A carotid endarterectomy. This is a surgery to remove the blockages in the carotid arteries.   A carotid angioplasty with stenting. This is a nonsurgical interventional procedure. A wire mesh (stent) is used to widen the blocked carotid arteries.   Medicines to control blood pressure, cholesterol, and reduce blood clotting (antiplatelet therapy).   Adjusting your diet.   Lifestyle changes such as:   Quitting smoking.   Exercising as tolerated or as directed by your health care provider.   Controlling and maintaining a good blood pressure.   Keeping cholesterol levels under control.  HOME CARE INSTRUCTIONS   Take medicines only as directed by your health care provider. Make sure you understand all your medicine instructions. Do not stop your medicines without talking to your health care provider.   Follow your health care provider's diet instructions. It is important to eat a healthy diet that is low in saturated fats and includes plenty of fresh fruits, vegetables, and lean meats. High-fat, high-sodium foods as well as foods that are fried, overly processed, or have poor nutritional value should be avoided.  Maintain a healthy weight.   Stay physically active. It is recommended that you get at least 30 minutes of activity every day.   Do not use any tobacco products including cigarettes, chewing tobacco, or electronic cigarettes. If you need help quitting, ask your health care provider.  Limit alcohol use to:   No more than 2 drinks per day for men.   No more than 1 drink per day for nonpregnant women.   Do not use illegal drugs.   Keep all follow-up visits as directed by your  health care provider.  SEEK IMMEDIATE MEDICAL CARE IF:  You develop TIA or stroke symptoms. These include:   Sudden weakness or numbness on one side of the body, such as in the face, arm, or leg.   Sudden confusion.    Trouble speaking (aphasia) or understanding.   Sudden trouble seeing out of one or both eyes.   Sudden trouble walking.   Dizziness or feeling like you might faint.   Loss of balance or coordination.   Sudden severe headache with no known cause.   Sudden trouble swallowing (dysphagia).  If you have any of these symptoms, call your local emergency services (911 in [place] Do not drive yourself to the clinic or hospital. This is a medical emergency.  Document Released: 10/17/2011 Document Revised: 12/09/2013 Document Reviewed: 01/23/2013 [facility] Patient Information 2015 [place], [place]. This information is not intended to replace advice given to you by your health care provider. Make sure you discuss any questions you have with your health care provider.

## 2014-10-08 NOTE — Progress Notes (Signed)
    Established Carotid Patient  History of Present Illness  [name] is a 59 y.o. (01-28-1956) male who presents for follow up carotid artery stenosis. He was last seen in the office on 08/29/12 where carotid duplex studies revealed 40-59% ICA stenosis bilaterally. He was referred back here by Dr. Wayne Sever due to progression of his left carotid stenosis.   Patient has prior history of CVA in 2012 with no residual deficits.  The patient denies any recent amaurosis fugax or monocular blindness.  The patient has not had facial drooping or hemiplegia.  The patient has not had receptive aphasia.  He describes having trouble with his words "here and there  He also notes his legs "giving out" when he walks. 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 He has a one month history of a wound to his left foot. It has been unchanged. He describes minimal sensation to his feet.  This is being followed by [facility]   On ROS today: he denies any rest pain. He denies any shortness of breath or chest pain.  He is on an aspirin and statin. He is on pradaxa for atrial fibrillation. His diabetes is managed on insulin and oral medications.   Physical Examination  Filed Vitals:   10/08/14 1410 10/08/14 1411  BP: 114/65 130/61  Pulse: 67 64  Resp: 14   Height: 5' (1.524 m)   Weight: 242 lb (109.77 kg)    Body mass index is 47.26 kg/(m^2).  General: A&O x 3, Obese male in NAD  Neck: Supple  Pulmonary: Sym exp, good air movt, CTAB, no rales, rhonchi, & wheezing,   Cardiac: RRR, Nl S1, S2, no Murmurs, rubs or gallops, no carotid bruits.   Vascular: Right midfoot amputation. Triphasic right PT doppler signal. Biphasic right DP doppler signal. Left biphasic PT and DP doppler signals. 2 x 2 cm ulcer to plantar aspect of left foot. No erythema or drainage.   Musculoskeletal: M/S 5/5 throughout.  Extremities without ischemic changes.  Neurologic: CN 2-12 grossly intact.  Non-Invasive Vascular Imaging  CAROTID DUPLEX (Date: 09/11/14):   R ICA stenosis: 40-59%  R VA:  patent and antegrade  L ICA stenosis: high end of 60-79%  L VA: not visualized  Medical Decision Making  [name] is a 59 y.o. male who presents with:   Asymptomatic bilateral carotid stenosis  His left carotid stenosis has progressed to 60-79% from 40-59% back in January of 2014. He has not had any signs or symptoms of TIA or stroke. He is on maximal medical management with ASA and a statin. He is also on pradaxa for atrial fibrillation. Given that he has remained asymptomatic and his stenosis is less than 80%, we will follow up in 6 months for repeat carotid duplex studies.   Diabetic foot ulcer His left foot ulcer is currently being followed by [facility]. He had a previous right diabetic foot ulcer requiring midfoot amputation. He had biphasic left doppler signals. We will obtain ABIs at his carotid follow-up to establish his baseline circulation.   Virgina Jock, PA-C [facility] Office: [phone] Pager: [phone]  10/08/2014, 2:50 PM  This patient was seen in conjunction with Dr. Scot Dock

## 2014-10-26 ENCOUNTER — Inpatient Hospital Stay (HOSPITAL_COMMUNITY)
Admission: EM | Admit: 2014-10-26 | Discharge: 2014-11-03 | DRG: 871 | Disposition: A | Discharge status: Home Health | Payer: Medicare (Managed Care) | Attending: Internal Medicine | Admitting: Internal Medicine

## 2014-10-26 ENCOUNTER — Emergency Department (HOSPITAL_COMMUNITY): Payer: Medicare (Managed Care)

## 2014-10-26 ENCOUNTER — Encounter (HOSPITAL_COMMUNITY): Payer: Self-pay | Admitting: Emergency Medicine

## 2014-10-26 DIAGNOSIS — M545 Low back pain: Secondary | ICD-10-CM | POA: Diagnosis present

## 2014-10-26 DIAGNOSIS — N390 Urinary tract infection, site not specified: Secondary | ICD-10-CM | POA: Diagnosis present

## 2014-10-26 DIAGNOSIS — E11621 Type 2 diabetes mellitus with foot ulcer: Secondary | ICD-10-CM | POA: Diagnosis present

## 2014-10-26 DIAGNOSIS — Z8673 Personal history of transient ischemic attack (TIA), and cerebral infarction without residual deficits: Secondary | ICD-10-CM | POA: Diagnosis not present

## 2014-10-26 DIAGNOSIS — A419 Sepsis, unspecified organism: Secondary | ICD-10-CM | POA: Diagnosis present

## 2014-10-26 DIAGNOSIS — J9621 Acute and chronic respiratory failure with hypoxia: Secondary | ICD-10-CM | POA: Diagnosis present

## 2014-10-26 DIAGNOSIS — I214 Non-ST elevation (NSTEMI) myocardial infarction: Secondary | ICD-10-CM | POA: Diagnosis present

## 2014-10-26 DIAGNOSIS — R7989 Other specified abnormal findings of blood chemistry: Secondary | ICD-10-CM

## 2014-10-26 DIAGNOSIS — I129 Hypertensive chronic kidney disease with stage 1 through stage 4 chronic kidney disease, or unspecified chronic kidney disease: Secondary | ICD-10-CM | POA: Diagnosis present

## 2014-10-26 DIAGNOSIS — Z8249 Family history of ischemic heart disease and other diseases of the circulatory system: Secondary | ICD-10-CM

## 2014-10-26 DIAGNOSIS — I481 Persistent atrial fibrillation: Secondary | ICD-10-CM | POA: Diagnosis present

## 2014-10-26 DIAGNOSIS — D649 Anemia, unspecified: Secondary | ICD-10-CM | POA: Diagnosis present

## 2014-10-26 DIAGNOSIS — E7800 Pure hypercholesterolemia, unspecified: Secondary | ICD-10-CM | POA: Diagnosis present

## 2014-10-26 DIAGNOSIS — I5033 Acute on chronic diastolic (congestive) heart failure: Secondary | ICD-10-CM | POA: Diagnosis present

## 2014-10-26 DIAGNOSIS — Z880 Allergy status to penicillin: Secondary | ICD-10-CM | POA: Diagnosis not present

## 2014-10-26 DIAGNOSIS — Z79899 Other long term (current) drug therapy: Secondary | ICD-10-CM | POA: Diagnosis not present

## 2014-10-26 DIAGNOSIS — R9431 Abnormal electrocardiogram [ECG] [EKG]: Secondary | ICD-10-CM

## 2014-10-26 DIAGNOSIS — E662 Morbid (severe) obesity with alveolar hypoventilation: Secondary | ICD-10-CM | POA: Diagnosis present

## 2014-10-26 DIAGNOSIS — Z7982 Long term (current) use of aspirin: Secondary | ICD-10-CM | POA: Diagnosis not present

## 2014-10-26 DIAGNOSIS — S30811A Abrasion of abdominal wall, initial encounter: Secondary | ICD-10-CM | POA: Diagnosis present

## 2014-10-26 DIAGNOSIS — Z79891 Long term (current) use of opiate analgesic: Secondary | ICD-10-CM

## 2014-10-26 DIAGNOSIS — G629 Polyneuropathy, unspecified: Secondary | ICD-10-CM | POA: Diagnosis present

## 2014-10-26 DIAGNOSIS — J9601 Acute respiratory failure with hypoxia: Secondary | ICD-10-CM | POA: Diagnosis present

## 2014-10-26 DIAGNOSIS — Z9841 Cataract extraction status, right eye: Secondary | ICD-10-CM

## 2014-10-26 DIAGNOSIS — I10 Essential (primary) hypertension: Secondary | ICD-10-CM

## 2014-10-26 DIAGNOSIS — K469 Unspecified abdominal hernia without obstruction or gangrene: Secondary | ICD-10-CM | POA: Diagnosis present

## 2014-10-26 DIAGNOSIS — I6523 Occlusion and stenosis of bilateral carotid arteries: Secondary | ICD-10-CM | POA: Diagnosis present

## 2014-10-26 DIAGNOSIS — Z87891 Personal history of nicotine dependence: Secondary | ICD-10-CM | POA: Diagnosis not present

## 2014-10-26 DIAGNOSIS — I4510 Unspecified right bundle-branch block: Secondary | ICD-10-CM | POA: Diagnosis present

## 2014-10-26 DIAGNOSIS — I509 Heart failure, unspecified: Secondary | ICD-10-CM

## 2014-10-26 DIAGNOSIS — R079 Chest pain, unspecified: Secondary | ICD-10-CM

## 2014-10-26 DIAGNOSIS — N182 Chronic kidney disease, stage 2 (mild): Secondary | ICD-10-CM | POA: Diagnosis present

## 2014-10-26 DIAGNOSIS — I2510 Atherosclerotic heart disease of native coronary artery without angina pectoris: Secondary | ICD-10-CM | POA: Diagnosis present

## 2014-10-26 DIAGNOSIS — J9620 Acute and chronic respiratory failure, unspecified whether with hypoxia or hypercapnia: Secondary | ICD-10-CM | POA: Diagnosis present

## 2014-10-26 DIAGNOSIS — I5031 Acute diastolic (congestive) heart failure: Secondary | ICD-10-CM | POA: Diagnosis present

## 2014-10-26 DIAGNOSIS — K439 Ventral hernia without obstruction or gangrene: Secondary | ICD-10-CM | POA: Diagnosis present

## 2014-10-26 DIAGNOSIS — K219 Gastro-esophageal reflux disease without esophagitis: Secondary | ICD-10-CM | POA: Diagnosis present

## 2014-10-26 DIAGNOSIS — E785 Hyperlipidemia, unspecified: Secondary | ICD-10-CM | POA: Diagnosis present

## 2014-10-26 DIAGNOSIS — R532 Functional quadriplegia: Secondary | ICD-10-CM | POA: Diagnosis present

## 2014-10-26 DIAGNOSIS — I452 Bifascicular block: Secondary | ICD-10-CM | POA: Diagnosis present

## 2014-10-26 DIAGNOSIS — I248 Other forms of acute ischemic heart disease: Secondary | ICD-10-CM | POA: Diagnosis present

## 2014-10-26 DIAGNOSIS — F419 Anxiety disorder, unspecified: Secondary | ICD-10-CM | POA: Diagnosis present

## 2014-10-26 DIAGNOSIS — I480 Paroxysmal atrial fibrillation: Secondary | ICD-10-CM | POA: Diagnosis present

## 2014-10-26 DIAGNOSIS — T380X5A Adverse effect of glucocorticoids and synthetic analogues, initial encounter: Secondary | ICD-10-CM | POA: Diagnosis present

## 2014-10-26 DIAGNOSIS — L03116 Cellulitis of left lower limb: Secondary | ICD-10-CM | POA: Diagnosis present

## 2014-10-26 DIAGNOSIS — IMO0002 Reserved for concepts with insufficient information to code with codable children: Secondary | ICD-10-CM | POA: Diagnosis present

## 2014-10-26 DIAGNOSIS — Z89431 Acquired absence of right foot: Secondary | ICD-10-CM | POA: Diagnosis not present

## 2014-10-26 DIAGNOSIS — F70 Mild intellectual disabilities: Secondary | ICD-10-CM | POA: Diagnosis present

## 2014-10-26 DIAGNOSIS — J441 Chronic obstructive pulmonary disease with (acute) exacerbation: Secondary | ICD-10-CM | POA: Diagnosis present

## 2014-10-26 DIAGNOSIS — G8929 Other chronic pain: Secondary | ICD-10-CM | POA: Diagnosis present

## 2014-10-26 DIAGNOSIS — Z803 Family history of malignant neoplasm of breast: Secondary | ICD-10-CM | POA: Diagnosis not present

## 2014-10-26 DIAGNOSIS — L97529 Non-pressure chronic ulcer of other part of left foot with unspecified severity: Secondary | ICD-10-CM | POA: Diagnosis present

## 2014-10-26 DIAGNOSIS — I4891 Unspecified atrial fibrillation: Secondary | ICD-10-CM

## 2014-10-26 DIAGNOSIS — E875 Hyperkalemia: Secondary | ICD-10-CM | POA: Diagnosis present

## 2014-10-26 DIAGNOSIS — Z9842 Cataract extraction status, left eye: Secondary | ICD-10-CM

## 2014-10-26 DIAGNOSIS — N179 Acute kidney failure, unspecified: Secondary | ICD-10-CM | POA: Diagnosis present

## 2014-10-26 DIAGNOSIS — D509 Iron deficiency anemia, unspecified: Secondary | ICD-10-CM | POA: Diagnosis present

## 2014-10-26 DIAGNOSIS — Z794 Long term (current) use of insulin: Secondary | ICD-10-CM

## 2014-10-26 DIAGNOSIS — K5900 Constipation, unspecified: Secondary | ICD-10-CM | POA: Diagnosis not present

## 2014-10-26 DIAGNOSIS — Z7901 Long term (current) use of anticoagulants: Secondary | ICD-10-CM | POA: Diagnosis not present

## 2014-10-26 DIAGNOSIS — E1165 Type 2 diabetes mellitus with hyperglycemia: Secondary | ICD-10-CM

## 2014-10-26 DIAGNOSIS — Z6841 Body Mass Index (BMI) 40.0 and over, adult: Secondary | ICD-10-CM | POA: Diagnosis not present

## 2014-10-26 DIAGNOSIS — R52 Pain, unspecified: Secondary | ICD-10-CM

## 2014-10-26 DIAGNOSIS — Z800 Family history of malignant neoplasm of digestive organs: Secondary | ICD-10-CM | POA: Diagnosis not present

## 2014-10-26 DIAGNOSIS — R778 Other specified abnormalities of plasma proteins: Secondary | ICD-10-CM

## 2014-10-26 DIAGNOSIS — E11622 Type 2 diabetes mellitus with other skin ulcer: Secondary | ICD-10-CM | POA: Diagnosis present

## 2014-10-26 LAB — INFLUENZA PANEL BY PCR (TYPE A & B)
H1N1 flu by pcr: NOT DETECTED
Influenza A By PCR: NEGATIVE
Influenza B By PCR: NEGATIVE

## 2014-10-26 LAB — I-STAT ARTERIAL BLOOD GAS, ED
Acid-Base Excess: 3 mmol/L — ABNORMAL HIGH (ref 0.0–2.0)
BICARBONATE: 28.9 meq/L — AB (ref 20.0–24.0)
O2 SAT: 90 %
Patient temperature: 98.6
TCO2: 30 mmol/L (ref 0–100)
pCO2 arterial: 51.7 mmHg — ABNORMAL HIGH (ref 35.0–45.0)
pH, Arterial: 7.356 (ref 7.350–7.450)
pO2, Arterial: 63 mmHg — ABNORMAL LOW (ref 80.0–100.0)

## 2014-10-26 LAB — CBC WITH DIFFERENTIAL/PLATELET
Basophils Absolute: 0 10*3/uL (ref 0.0–0.1)
Basophils Absolute: 0 10*3/uL (ref 0.0–0.1)
Basophils Relative: 0 % (ref 0–1)
Basophils Relative: 0 % (ref 0–1)
EOS ABS: 0.9 10*3/uL — AB (ref 0.0–0.7)
EOS PCT: 5 % (ref 0–5)
Eosinophils Absolute: 0.6 10*3/uL (ref 0.0–0.7)
Eosinophils Relative: 6 % — ABNORMAL HIGH (ref 0–5)
HCT: 33.3 % — ABNORMAL LOW (ref 39.0–52.0)
HEMATOCRIT: 35.3 % — AB (ref 39.0–52.0)
Hemoglobin: 10.3 g/dL — ABNORMAL LOW (ref 13.0–17.0)
Hemoglobin: 10.6 g/dL — ABNORMAL LOW (ref 13.0–17.0)
LYMPHS ABS: 1.5 10*3/uL (ref 0.7–4.0)
LYMPHS PCT: 11 % — AB (ref 12–46)
Lymphocytes Relative: 13 % (ref 12–46)
Lymphs Abs: 1.6 10*3/uL (ref 0.7–4.0)
MCH: 25.5 pg — ABNORMAL LOW (ref 26.0–34.0)
MCH: 25.1 pg — AB (ref 26.0–34.0)
MCHC: 30.9 g/dL (ref 30.0–36.0)
MCHC: 30 g/dL (ref 30.0–36.0)
MCV: 82.4 fL (ref 78.0–100.0)
MCV: 83.6 fL (ref 78.0–100.0)
MONOS PCT: 6 % (ref 3–12)
Monocytes Absolute: 0.8 10*3/uL (ref 0.1–1.0)
Monocytes Absolute: 0.9 10*3/uL (ref 0.1–1.0)
Monocytes Relative: 7 % (ref 3–12)
NEUTROS ABS: 10.8 10*3/uL — AB (ref 1.7–7.7)
NEUTROS PCT: 75 % (ref 43–77)
Neutro Abs: 9.5 10*3/uL — ABNORMAL HIGH (ref 1.7–7.7)
Neutrophils Relative %: 77 % (ref 43–77)
PLATELETS: 283 10*3/uL (ref 150–400)
Platelets: 271 10*3/uL (ref 150–400)
RBC: 4.04 MIL/uL — ABNORMAL LOW (ref 4.22–5.81)
RBC: 4.22 MIL/uL (ref 4.22–5.81)
RDW: 15.3 % (ref 11.5–15.5)
RDW: 15.5 % (ref 11.5–15.5)
WBC: 14 10*3/uL — ABNORMAL HIGH (ref 4.0–10.5)
WBC: 12.6 10*3/uL — AB (ref 4.0–10.5)

## 2014-10-26 LAB — I-STAT TROPONIN, ED: TROPONIN I, POC: 0.1 ng/mL — AB (ref 0.00–0.08)

## 2014-10-26 LAB — PROTIME-INR
INR: 1.36 (ref 0.00–1.49)
Prothrombin Time: 17 seconds — ABNORMAL HIGH (ref 11.6–15.2)

## 2014-10-26 LAB — GLUCOSE, CAPILLARY
Glucose-Capillary: 154 mg/dL — ABNORMAL HIGH (ref 70–99)
Glucose-Capillary: 176 mg/dL — ABNORMAL HIGH (ref 70–99)
Glucose-Capillary: 204 mg/dL — ABNORMAL HIGH (ref 70–99)

## 2014-10-26 LAB — I-STAT CG4 LACTIC ACID, ED
LACTIC ACID, VENOUS: 1.53 mmol/L (ref 0.5–2.0)
Lactic Acid, Venous: 2.36 mmol/L (ref 0.5–2.0)

## 2014-10-26 LAB — BASIC METABOLIC PANEL
ANION GAP: 10 (ref 5–15)
BUN: 26 mg/dL — ABNORMAL HIGH (ref 6–23)
CHLORIDE: 100 mmol/L (ref 96–112)
CO2: 29 mmol/L (ref 19–32)
CREATININE: 1.31 mg/dL (ref 0.50–1.35)
Calcium: 8.9 mg/dL (ref 8.4–10.5)
GFR calc Af Amer: 67 mL/min — ABNORMAL LOW (ref >90)
GFR calc non Af Amer: 58 mL/min — ABNORMAL LOW (ref >90)
Glucose, Bld: 195 mg/dL — ABNORMAL HIGH (ref 70–99)
Potassium: 5.1 mmol/L (ref 3.5–5.1)
SODIUM: 139 mmol/L (ref 135–145)

## 2014-10-26 LAB — COMPREHENSIVE METABOLIC PANEL
ALK PHOS: 70 U/L (ref 39–117)
ALT: 17 U/L (ref 0–53)
ANION GAP: 7 (ref 5–15)
AST: 21 U/L (ref 0–37)
Albumin: 3 g/dL — ABNORMAL LOW (ref 3.5–5.2)
BUN: 26 mg/dL — AB (ref 6–23)
CALCIUM: 8.7 mg/dL (ref 8.4–10.5)
CHLORIDE: 103 mmol/L (ref 96–112)
CO2: 28 mmol/L (ref 19–32)
CREATININE: 1.37 mg/dL — AB (ref 0.50–1.35)
GFR calc Af Amer: 63 mL/min — ABNORMAL LOW (ref >90)
GFR calc non Af Amer: 55 mL/min — ABNORMAL LOW (ref >90)
Glucose, Bld: 240 mg/dL — ABNORMAL HIGH (ref 70–99)
Potassium: 5.5 mmol/L — ABNORMAL HIGH (ref 3.5–5.1)
Sodium: 138 mmol/L (ref 135–145)
TOTAL PROTEIN: 6.4 g/dL (ref 6.0–8.3)
Total Bilirubin: 0.5 mg/dL (ref 0.3–1.2)

## 2014-10-26 LAB — URINALYSIS, ROUTINE W REFLEX MICROSCOPIC
Bilirubin Urine: NEGATIVE
Glucose, UA: NEGATIVE mg/dL
Ketones, ur: NEGATIVE mg/dL
Nitrite: NEGATIVE
PH: 5 (ref 5.0–8.0)
Protein, ur: 100 mg/dL — AB
Specific Gravity, Urine: 1.017 (ref 1.005–1.030)
UROBILINOGEN UA: 0.2 mg/dL (ref 0.0–1.0)

## 2014-10-26 LAB — TROPONIN I
TROPONIN I: 0.12 ng/mL — AB (ref <0.031)
Troponin I: 5.92 ng/mL (ref <0.031)

## 2014-10-26 LAB — CBG MONITORING, ED: Glucose-Capillary: 198 mg/dL — ABNORMAL HIGH (ref 70–99)

## 2014-10-26 LAB — MRSA PCR SCREENING: MRSA by PCR: NEGATIVE

## 2014-10-26 LAB — HEPARIN LEVEL (UNFRACTIONATED): Heparin Unfractionated: <0.1 IU/mL — ABNORMAL LOW (ref 0.30–0.70)

## 2014-10-26 LAB — URINE MICROSCOPIC-ADD ON

## 2014-10-26 LAB — BRAIN NATRIURETIC PEPTIDE: B NATRIURETIC PEPTIDE 5: 345.2 pg/mL — AB (ref 0.0–100.0)

## 2014-10-26 LAB — LIPASE, BLOOD: LIPASE: 17 U/L (ref 11–59)

## 2014-10-26 LAB — PROCALCITONIN: Procalcitonin: <0.1 ng/mL

## 2014-10-26 LAB — APTT: APTT: 54 s — AB (ref 24–37)

## 2014-10-26 LAB — LACTIC ACID, PLASMA: Lactic Acid, Venous: 1.5 mmol/L (ref 0.5–2.0)

## 2014-10-26 MED ORDER — LEVALBUTEROL HCL 0.63 MG/3ML IN NEBU
0.6300 mg | INHALATION_SOLUTION | Freq: Four times a day (QID) | RESPIRATORY_TRACT | Status: DC | PRN
  Administered 2014-10-28: 0.63 mg via RESPIRATORY_TRACT
  Filled 2014-10-26 (×2): qty 3

## 2014-10-26 MED ORDER — LORAZEPAM 2 MG/ML IJ SOLN
INTRAMUSCULAR | Status: AC
  Administered 2014-10-26: 1 mg
  Filled 2014-10-26: qty 1

## 2014-10-26 MED ORDER — IPRATROPIUM-ALBUTEROL 0.5-2.5 (3) MG/3ML IN SOLN
3.0000 mL | Freq: Once | RESPIRATORY_TRACT | Status: AC
  Administered 2014-10-26: 3 mL via RESPIRATORY_TRACT
  Filled 2014-10-26: qty 3

## 2014-10-26 MED ORDER — SODIUM CHLORIDE 0.9 % IJ SOLN
3.0000 mL | Freq: Two times a day (BID) | INTRAMUSCULAR | Status: DC
  Administered 2014-10-26 – 2014-11-02 (×11): 3 mL via INTRAVENOUS

## 2014-10-26 MED ORDER — METOPROLOL TARTRATE 1 MG/ML IV SOLN
INTRAVENOUS | Status: AC
  Filled 2014-10-26: qty 5

## 2014-10-26 MED ORDER — FUROSEMIDE 10 MG/ML IJ SOLN
INTRAMUSCULAR | Status: AC
  Administered 2014-10-26: 80 mg via INTRAVENOUS
  Filled 2014-10-26: qty 4

## 2014-10-26 MED ORDER — FUROSEMIDE 10 MG/ML IJ SOLN
80.0000 mg | Freq: Two times a day (BID) | INTRAMUSCULAR | Status: AC
  Administered 2014-10-26 – 2014-10-27 (×4): 80 mg via INTRAVENOUS
  Filled 2014-10-26 (×3): qty 8

## 2014-10-26 MED ORDER — HEPARIN (PORCINE) IN NACL 100-0.45 UNIT/ML-% IJ SOLN
1650.0000 [IU]/h | INTRAMUSCULAR | Status: DC
  Administered 2014-10-26 (×2): 1200 [IU]/h via INTRAVENOUS
  Filled 2014-10-26 (×3): qty 250

## 2014-10-26 MED ORDER — ONDANSETRON HCL 4 MG/2ML IJ SOLN
4.0000 mg | Freq: Four times a day (QID) | INTRAMUSCULAR | Status: DC | PRN
  Administered 2014-10-28 – 2014-11-03 (×6): 4 mg via INTRAVENOUS
  Filled 2014-10-26 (×6): qty 2

## 2014-10-26 MED ORDER — ATORVASTATIN CALCIUM 40 MG PO TABS: 40.0000 mg | ORAL_TABLET | Freq: Every day | ORAL | Status: DC

## 2014-10-26 MED ORDER — ACETAMINOPHEN 325 MG PO TABS
650.0000 mg | ORAL_TABLET | ORAL | Status: DC | PRN
  Administered 2014-10-27 – 2014-10-30 (×5): 650 mg via ORAL
  Filled 2014-10-26 (×7): qty 2

## 2014-10-26 MED ORDER — LORAZEPAM 2 MG/ML IJ SOLN: 1.0000 mg | Freq: Once | INTRAMUSCULAR | Status: AC

## 2014-10-26 MED ORDER — INSULIN GLARGINE 100 UNIT/ML ~~LOC~~ SOLN
40.0000 [IU] | Freq: Every day | SUBCUTANEOUS | Status: DC
  Administered 2014-10-26 – 2014-11-02 (×8): 40 [IU] via SUBCUTANEOUS
  Filled 2014-10-26 (×9): qty 0.4

## 2014-10-26 MED ORDER — DEXTROSE 5 % IV SOLN
2.0000 g | Freq: Once | INTRAVENOUS | Status: AC
  Administered 2014-10-26: 2 g via INTRAVENOUS
  Filled 2014-10-26: qty 2

## 2014-10-26 MED ORDER — HALOPERIDOL LACTATE 5 MG/ML IJ SOLN
INTRAMUSCULAR | Status: AC
  Filled 2014-10-26: qty 1

## 2014-10-26 MED ORDER — FERROUS GLUCONATE 324 (38 FE) MG PO TABS
325.0000 mg | ORAL_TABLET | Freq: Two times a day (BID) | ORAL | Status: DC
  Administered 2014-10-28: 324 mg via ORAL
  Administered 2014-10-28: 325 mg via ORAL
  Filled 2014-10-26 (×9): qty 1

## 2014-10-26 MED ORDER — VANCOMYCIN HCL IN DEXTROSE 750-5 MG/150ML-% IV SOLN
750.0000 mg | Freq: Two times a day (BID) | INTRAVENOUS | Status: DC
  Administered 2014-10-26 – 2014-10-30 (×8): 750 mg via INTRAVENOUS
  Filled 2014-10-26 (×10): qty 150

## 2014-10-26 MED ORDER — FUROSEMIDE 10 MG/ML IJ SOLN
40.0000 mg | Freq: Once | INTRAMUSCULAR | Status: AC
  Administered 2014-10-26: 40 mg via INTRAVENOUS
  Filled 2014-10-26: qty 4

## 2014-10-26 MED ORDER — SODIUM CHLORIDE 0.9 % IJ SOLN: 3.0000 mL | INTRAMUSCULAR | Status: DC | PRN

## 2014-10-26 MED ORDER — DILTIAZEM HCL ER COATED BEADS 240 MG PO CP24
240.0000 mg | ORAL_CAPSULE | Freq: Every day | ORAL | Status: DC
  Filled 2014-10-26: qty 1

## 2014-10-26 MED ORDER — DEXTROSE 5 % IV SOLN: 2.0000 g | Freq: Once | INTRAVENOUS | Status: DC

## 2014-10-26 MED ORDER — IPRATROPIUM-ALBUTEROL 0.5-2.5 (3) MG/3ML IN SOLN
RESPIRATORY_TRACT | Status: AC
  Administered 2014-10-26: 3 mL via RESPIRATORY_TRACT
  Filled 2014-10-26: qty 3

## 2014-10-26 MED ORDER — SODIUM CHLORIDE 0.9 % IJ SOLN
10.0000 mL | Freq: Two times a day (BID) | INTRAMUSCULAR | Status: DC
  Administered 2014-10-26: 40 mL
  Administered 2014-10-27: 30 mL
  Administered 2014-10-28: 10 mL
  Administered 2014-10-28: 20 mL
  Administered 2014-10-29 (×2): 10 mL
  Administered 2014-10-30 – 2014-11-02 (×2): 30 mL
  Administered 2014-11-03: 10 mL

## 2014-10-26 MED ORDER — DEXTROSE 5 % IV SOLN
2.0000 g | Freq: Three times a day (TID) | INTRAVENOUS | Status: DC
  Administered 2014-10-26 – 2014-10-30 (×12): 2 g via INTRAVENOUS
  Filled 2014-10-26 (×14): qty 2

## 2014-10-26 MED ORDER — SODIUM CHLORIDE 0.9 % IJ SOLN
10.0000 mL | INTRAMUSCULAR | Status: DC | PRN
  Administered 2014-11-01: 10 mL
  Filled 2014-10-26: qty 40

## 2014-10-26 MED ORDER — VANCOMYCIN HCL IN DEXTROSE 1-5 GM/200ML-% IV SOLN: 1000.0000 mg | Freq: Once | INTRAVENOUS | Status: DC

## 2014-10-26 MED ORDER — LEVALBUTEROL HCL 1.25 MG/0.5ML IN NEBU
1.2500 mg | INHALATION_SOLUTION | Freq: Four times a day (QID) | RESPIRATORY_TRACT | Status: DC | PRN
  Filled 2014-10-26: qty 0.5

## 2014-10-26 MED ORDER — METOPROLOL TARTRATE 1 MG/ML IV SOLN
5.0000 mg | Freq: Once | INTRAVENOUS | Status: AC
Start: 2014-10-26 — End: 2014-10-26
  Administered 2014-10-26: 5 mg via INTRAVENOUS

## 2014-10-26 MED ORDER — PANTOPRAZOLE SODIUM 40 MG PO TBEC
40.0000 mg | DELAYED_RELEASE_TABLET | Freq: Every day | ORAL | Status: DC
  Administered 2014-10-28 – 2014-11-03 (×7): 40 mg via ORAL
  Filled 2014-10-26 (×7): qty 1

## 2014-10-26 MED ORDER — PAROXETINE HCL 20 MG PO TABS
40.0000 mg | ORAL_TABLET | ORAL | Status: DC
  Administered 2014-10-28 – 2014-11-03 (×7): 40 mg via ORAL
  Filled 2014-10-26 (×9): qty 2

## 2014-10-26 MED ORDER — SODIUM CHLORIDE 0.9 % IV SOLN
250.0000 mL | INTRAVENOUS | Status: DC | PRN
  Administered 2014-10-26 – 2014-10-30 (×2): 250 mL via INTRAVENOUS

## 2014-10-26 MED ORDER — METOPROLOL TARTRATE 1 MG/ML IV SOLN
2.5000 mg | Freq: Once | INTRAVENOUS | Status: AC
  Administered 2014-10-26: 2.5 mg via INTRAVENOUS
  Filled 2014-10-26: qty 5

## 2014-10-26 MED ORDER — GABAPENTIN 300 MG PO CAPS
300.0000 mg | ORAL_CAPSULE | Freq: Two times a day (BID) | ORAL | Status: DC
  Administered 2014-10-27 – 2014-11-03 (×14): 300 mg via ORAL
  Filled 2014-10-26 (×17): qty 1

## 2014-10-26 MED ORDER — LEVALBUTEROL HCL 1.25 MG/0.5ML IN NEBU
1.2500 mg | INHALATION_SOLUTION | Freq: Four times a day (QID) | RESPIRATORY_TRACT | Status: DC
  Filled 2014-10-26 (×4): qty 0.5

## 2014-10-26 MED ORDER — METOPROLOL TARTRATE 25 MG PO TABS
25.0000 mg | ORAL_TABLET | Freq: Two times a day (BID) | ORAL | Status: DC
  Administered 2014-10-27 – 2014-10-29 (×4): 25 mg via ORAL
  Filled 2014-10-26 (×7): qty 1

## 2014-10-26 MED ORDER — VANCOMYCIN HCL 10 G IV SOLR
2000.0000 mg | Freq: Once | INTRAVENOUS | Status: AC
  Administered 2014-10-26: 2000 mg via INTRAVENOUS
  Filled 2014-10-26 (×3): qty 2000

## 2014-10-26 MED ORDER — ATORVASTATIN CALCIUM 80 MG PO TABS
80.0000 mg | ORAL_TABLET | Freq: Every day | ORAL | Status: DC
  Administered 2014-10-27 – 2014-11-02 (×7): 80 mg via ORAL
  Filled 2014-10-26 (×8): qty 1

## 2014-10-26 MED ORDER — ASPIRIN 300 MG RE SUPP
300.0000 mg | Freq: Every day | RECTAL | Status: DC
  Administered 2014-10-26 – 2014-10-30 (×5): 300 mg via RECTAL
  Filled 2014-10-26 (×5): qty 1

## 2014-10-26 MED ORDER — HALOPERIDOL LACTATE 5 MG/ML IJ SOLN: 5.0000 mg | Freq: Once | INTRAMUSCULAR | Status: AC

## 2014-10-26 MED ORDER — INSULIN ASPART 100 UNIT/ML ~~LOC~~ SOLN
0.0000 [IU] | SUBCUTANEOUS | Status: DC
  Administered 2014-10-26: 2 [IU] via SUBCUTANEOUS
  Administered 2014-10-26: 3 [IU] via SUBCUTANEOUS
  Administered 2014-10-27: 1 [IU] via SUBCUTANEOUS
  Administered 2014-10-27 – 2014-10-28 (×6): 2 [IU] via SUBCUTANEOUS
  Administered 2014-10-28: 1 [IU] via SUBCUTANEOUS
  Administered 2014-10-28: 3 [IU] via SUBCUTANEOUS
  Administered 2014-10-28: 1 [IU] via SUBCUTANEOUS
  Administered 2014-10-29: 5 [IU] via SUBCUTANEOUS
  Administered 2014-10-29: 3 [IU] via SUBCUTANEOUS
  Administered 2014-10-29: 5 [IU] via SUBCUTANEOUS
  Administered 2014-10-29 (×2): 3 [IU] via SUBCUTANEOUS

## 2014-10-26 NOTE — ED Notes (Signed)
Otter, MD at bedside.  

## 2014-10-26 NOTE — Progress Notes (Signed)
Patient ID: [name], male   DOB: 08/11/1955, 59 y.o.   MRN: [id number]  [facility] PROGRESS NOTE  TAVARIUS GREWE DJS:[id number] DOB: 1956/03/21 DOA: 10/26/2014 PCP: [name], MD   Brief narrative:    59 y.o. 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 This has been associated with malaise, poor oral intake, subjective fevers, substernal area chest pain, non radiating.   In ED, pt noted to be hypoxic with oxygen saturation in mid 80's and requiring placement on BiPAP, initial temp (oral) 99.4 F but rectal temp 100.2 F. RR in 30's, BP 119/51. Blood work notable for WBC 14 K, K 5.5, Cr 1.37, troponin 0.12. Marland Kitchen ECG demonstrated NSR, RBBB, RAD, ST depression in leads V2-V6. Compared to 04/23/13, the anterolateral STD are new. CXR concerning for pulmonary vascular congestion and cardiology consulted. [facility] asked to admit to SDU for further evaluation.   Major events since admission:  3/20 - admit to SDU, concern ACS, d CHF, sepsis of unclear source   Assessment/Plan:    Principal Problem:   Acute hypoxic respiratory failure - appears to be multifactorial and setting of acute diastolic CHF and NSTEMI in pt with known COPD (yrs of smoking) - appreciate cardiology team following - pt started on Lasix 40 mg IV BID and also treating as ACS - Pradaxa on hold and started on Heparin drip for now - plan for TTE for further evaluation, cycle CE's - further recommendations based on 2 D ECHO - continue  Metoprolol and atorvastatin (increased dose to 80 mg PO QD)    Sepsis - criteria for sepsis met: T 100.2 F, RR in 30's, WBC 14 K, lactic acid 2.36, multiple organ systems failure  - sepsis order set in place - place on broad spectrum abx for now until more data back - abd looks somewhat erythematous and given history of abd abscess, this is certainly in differential, also ? cellulitis  - once renal function stabilizes,may ned CT abd for further evaluation if still warranted - repeat lactic acid, obtain blood and urine culture, UA pending, procalcitonin per sepsis protocol  - will need PICC line placed due to poor vascular access    Acute on chronic diastolic CHF - management with Lasix as noted above, per cardiology - daily weights and strict I's and O's - weight on admission 109.77 kg   NSTEMI - likely demand ischemia - management as noted above per cardiology    Paroxysmal atrial fibrillation, CHADS score 5 (CHF, HTN, DM, stroke) - currently in sinus rhythm - holding Pradaxa and currently on Heparin drip per cardiology - continue Metoprolol   Active Problems:   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 - pt has at least stage II CKD based on record reveiw with GFR in 70's with normal Cr but presence of multiple risk factors including HTN, HLD, DM - Cr is now up and likely in the setting of the pre renal etiology, decreased perfusion  - close monitoring of renal function while being diuresed with Lasix  - hold  Metformin    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, neuropathies  - will check A1C, pt on high dose insulin at home - hold Metformin for now and place on SSI and Lantus 40 U QHS   Hyperkalemia - repeat BMP now before we attempt to correct it   HLD - continue statin but will increase the dose to 80 mg per cardiology team recommendations    Essential hypertension - BP stable on admission    Ventral hernia - abd notable for mild epigastric erythema and TTP - close monitoring -  pt determined in the past to be poor surgical candidate    Iron deficiency anemia - no signs of active bleeding - repeat CBC in AM   Morbid obesity  - Body mass index is 47.28  DVT prophylaxis - pt Heparin drip per ACS protocol   Code Status: Full.  Family Communication:  No family at bedside  Disposition Plan: Admit to SDU   IV access:  Peripheral IV  Procedures and diagnostic studies:    CXR 10/26/2014   Prominent vasculature and interstitium, likely mild congestive heart failure.    Medical Consultants:  Cardiology   Other Consultants:  None  IAnti-Infectives/microbiology data:   Vancomycin 3/20 --> Aztreonam 3/20   Blood Cultures 3/20 --> Urine cu;ltures 3/20 -->   Faye Ramsay, MD  [facility] Pager [phone]  If 7PM-7AM, please contact night-coverage [email or web address] Password [facility] 10/26/2014, 10:52 AM   LOS: 0 days   HPI/Subjective: No events overnight.   Objective: Filed Vitals:   10/26/14 0900 10/26/14 0930 10/26/14 0945 10/26/14 1011  BP: 136/56 119/51    Pulse: 75  72   Temp:    100.2 F (37.9 C)  TempSrc:    Rectal  Resp: 34     SpO2: 97%  96%    No intake or output data in the 24 hours ending 10/26/14 1052  Exam:   General:  Pt is alert, follows commands appropriately, in mild distress due to dyspnea and being on BiPAP  Cardiovascular: Regular rate and rhythm, no rubs, no gallops  Respiratory: Crackles at bases, diminished breath sounds at bases   Abdomen: large ventral hernia with erythema in epigastric area and warmth to touch, TTP   Extremities: trace bilateral LE edema, right mid foot amputation  Data Reviewed: Basic Metabolic Panel:  Recent Labs Lab 10/26/14 0329  NA 138  K 5.5*  CL 103  CO2 28  GLUCOSE 240*  BUN 26*  CREATININE 1.37*  CALCIUM 8.7   Liver Function Tests:  Recent Labs Lab 10/26/14 0329  AST 21  ALT 17  ALKPHOS 70  BILITOT 0.5  PROT 6.4  ALBUMIN 3.0*    Recent Labs Lab 10/26/14 0329  LIPASE  17   CBC:  Recent Labs Lab 10/26/14 0329  WBC 14.0*  NEUTROABS 10.8*  HGB 10.3*  HCT 33.3*  MCV 82.4  PLT 283   Cardiac Enzymes:  Recent Labs Lab 10/26/14 0329  TROPONINI 0.12*   CBG:  Recent Labs Lab 10/26/14 0818  GLUCAP 198*    Scheduled Meds: . aspirin  300 mg Rectal Daily  . atorvastatin  40 mg Oral Daily  . diltiazem  240 mg Oral Daily  . ferrous gluconate  325 mg Oral BID  . furosemide  80 mg Intravenous Q12H  . gabapentin  300 mg Oral BID  . insulin aspart  0-9 Units Subcutaneous 6 times per day  . insulin glargine  40 Units Subcutaneous QHS  .  metoprolol tartrate  25 mg Oral BID  . pantoprazole  40 mg Oral Daily  . PARoxetine  40 mg Oral [id number]  . sodium chloride  3 mL Intravenous Q12H   Continuous Infusions: . sodium chloride    . heparin 1,200 Units/hr (10/26/14 1593)

## 2014-10-26 NOTE — ED Notes (Addendum)
Per EMS, pt woke up this AM with a coughing fit that caused him to vomit. Pt then started experiencing mid chest pain. Pt reports a productive cough x 2-3 days. CBG: 259. 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 Pt received 324 NTG en route.

## 2014-10-26 NOTE — ED Notes (Signed)
CBG reads 198.

## 2014-10-26 NOTE — ED Notes (Signed)
Dr Doyle Askew aware pt is hard stick.  Ordered PICC line to be placed, will collect blood work at that time.

## 2014-10-26 NOTE — Progress Notes (Signed)
CRITICAL VALUE ALERT  Critical value received: Trop 5.92  Date of notification: 10/26/2014  Time of notification:  5170  Critical value read back:Yes.    Nurse who received alert:  JL  MD notified (1st page):  Doyle Askew  Time of first page:  16  MD notified (2nd page):Sommers; called black box  Time of second page:1800  Responding MD:  Emmit Alexanders  Time MD responded:  [phone]

## 2014-10-26 NOTE — ED Notes (Signed)
PICC line ready for use per IV team.

## 2014-10-26 NOTE — Consult Note (Signed)
Patient ID: MASIAH WOODY MRN: [id number], DOB/AGE: [birth year]   Admit date: 10/26/2014   Primary Physician: [name], MD Primary Cardiologist: None  Pt. Profile:  59M former smoker with mental retardation, HTN, DM c/b chronic LE wounds, HLD, AF, prior stroke, mild carotid artery disease, large ventral hernia, and COPD who presents with chest pain and SOB.   Problem List  Past Medical History  Diagnosis Date  . Hypertension   . Diabetes mellitus   . Mental retardation     Sister helps to take care of him and takes him to appts  . Tobacco user     Smokes 1ppd for multiple years.  Quit after hosp 09/2010.  Marland Kitchen Anemia     History of Iron Def Anemia  . Anxiety   . Hyperlipidemia   . Atrial fibrillation 06/2009    CHADS score 2 (HTN, DM), was not on coumadin, but now on Pradaxa for Afib  . Lung nodule   . Chronic low back pain   . CVA (cerebral infarction) 09/2010    Bilateral with Left > Right  . Abdominal hernia     Chronic, not a good surgical candidate  . Carotid artery occlusion   . Obesity   . COPD (chronic obstructive pulmonary disease)   . Abscess, abdomen 12/31/2010    Referred to Wound Care in 01/2011 because of multiple abd abscess with VERY large ventral hernia (please look at image of CT abd/pelvis 09/2010).  Because of hernia I was hesitant to I&D.      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 Stroke     right hand numbness  . GERD (gastroesophageal reflux disease)   . Pneumonia     hx of  . Shortness of breath     with ambulation  . Edema of both legs     takes Lasix  . Numbness and tingling in hands   . Itching     all over body; pt scratches and has sores on bilateral arms and abdomen  . PONV (postoperative nausea and vomiting)   . Mental retardation     Past Surgical History  Procedure Laterality Date  . Carotid arteriogram  10/2010    30% right ICA stenosis, 40% left ICA stenosis   . Transesophageal  echocardiogram  09/2010    No ASD or PFO. EF 60-65%.  Normal systolic function. No evidence of thrombus.   . Transthoracic echocardiogram  09/2010     The cavity size was normal. Systolic function was vigorous.  EF 65-70%.  Normal wall funciton.   . Debridment of decubitus ulcer Right 02/13/2013  . I&d extremity Right 02/13/2013    Procedure: IRRIGATION AND DEBRIDEMENT FOOT ULCER;  Surgeon: Johnny Bridge, MD;  Location: [place];  Service: Orthopedics;  Laterality: Right;  PULSE LAVAGE  . Cataract extraction, bilateral    . Arm surgery Left     as a child  . Amputation Right 03/29/2013    Procedure: AMPUTATION RAY;  Surgeon: Newt Minion, MD;  Location: [place];  Service: Orthopedics;  Laterality: Right;  Right Foot 3rd and Possible 4th Ray Amputation  . Multiple tooth extractions    . Amputation Right 04/23/2013    Procedure: AMPUTATION RIGHT MID-FOOT;  Surgeon: Newt Minion, MD;  Location: [place];  Service: Orthopedics;  Laterality: Right;  . Esophagogastroduodenoscopy (egd) with propofol N/A 06/10/2014    Procedure: ESOPHAGOGASTRODUODENOSCOPY (EGD) WITH PROPOFOL;  Surgeon: Lajuan Lines  Pyrtle, MD;  Location: [facility];  Service: Gastroenterology;  Laterality: N/A;  . Colonoscopy with propofol N/A 06/10/2014    Procedure: COLONOSCOPY WITH PROPOFOL;  Surgeon: Jerene Bears, MD;  Location: [facility];  Service: Gastroenterology;  Laterality: N/A;  . Givens capsule study N/A 07/09/2014    Procedure: GIVENS CAPSULE STUDY;  Surgeon: Jerene Bears, MD;  Location: [facility];  Service: Gastroenterology;  Laterality: N/A;     Allergies  Allergies  Allergen Reactions  . Penicillins Hives and Nausea And Vomiting    HPI  59M former smoker with mental retardation, HTN, DM c/b chronic LE wounds, HLD, AF, prior stroke, mild carotid artery disease, large ventral hernia, and COPD who presents with chest pain and SOB.   History obtained via ED providers given difficulty speaking with patient d/t baseline  speech difficulties compounded by needing to wear bipap.   The patient had a 2-3 day history of a head cold with productive cough. This evening he awoke with a coughing fit, SOB, and vomited. The patient's sister was concerned and called EMS.   On arrival to the ER the patient was hemodynamically stable: 140/68, HR 91, 91% on RA. He was placed on BiPAP in the setting of progressive desaturations. ECG demonstrated NSR, RBBB, RAD, ST depression in leads V2-V6. Compared to 04/23/13, the anterolateral STD are new. Labs were notable for K 5.5, Cr 1.37, TnI 0.12, BNP 345, lactate 2.36 (ULN = 2), WBC 14. CXR demonstrated bilateral pulmonary edema with probable small left sided effusion. He was given 73m IV lasix and duonebs.   Of note, he had a TTE in March 2014 demonstrating normal EF. He had a low risk myoview (EF 69% w/o WMA) in Jan 2014.   Home Medications  Prior to Admission medications   Medication Sig Start Date End Date Taking? Authorizing Provider  aspirin EC 81 MG EC tablet Take 1 tablet (81 mg total) by mouth daily. 11/11/12   SKandis Nab MD  atorvastatin (LIPITOR) 40 MG tablet Take 40 mg by mouth daily.    Historical Provider, MD  dabigatran (PRADAXA) 150 MG CAPS Take 150 mg by mouth every 12 (twelve) hours.    Historical Provider, MD  diltiazem (CARDIZEM CD) 240 MG 24 hr capsule Take 240 mg by mouth daily.    Historical Provider, MD  ferrous gluconate (FERGON) 325 MG tablet Take 325 mg by mouth 2 (two) times daily.    Historical Provider, MD  furosemide (LASIX) 40 MG tablet Take 1 tablet (40 mg total) by mouth daily. 11/12/12   BLeeanne Rio MD  gabapentin (NEURONTIN) 300 MG capsule Take 300 mg by mouth 2 (two) times daily.    Historical Provider, MD  glucose blood test strip Use as instructed 12/13/12   BLeeanne Rio MD  HYDROcodone-acetaminophen ([facility] 5-325 MG per tablet Take 1 tablet by mouth every 6 (six) hours as needed for pain. 04/25/13   MNewt Minion MD    hydrocortisone 2.5 % ointment Apply topically 3 (three) times daily. To leg around wound. Do not apply to broken skin. 06/05/12   BLeeanne Rio MD  insulin glargine (LANTUS) 100 UNIT/ML injection Inject 40 Units into the skin at bedtime.     Historical Provider, MD  insulin glargine (LANTUS) 100 UNIT/ML injection Inject 60 Units into the skin daily with breakfast.    Historical Provider, MD  Lancets ([facility]) lancets Use as instructed 12/13/12   BLeeanne Rio MD  metFORMIN (GLUCOPHAGE) 1000  MG tablet Take 1,000 mg by mouth 2 (two) times daily with a meal.    Historical Provider, MD  metoprolol tartrate (LOPRESSOR) 25 MG tablet Take 25 mg by mouth 2 (two) times daily.    Historical Provider, MD  MOVIPREP 100 G SOLR Take 1 kit (200 g total) by mouth once. 04/29/14   Jerene Bears, MD  nitroGLYCERIN (NITROSTAT) 0.4 MG SL tablet Place 0.4 mg under the tongue every 5 (five) minutes as needed for chest pain.  07/23/12   Leeanne Rio, MD  omeprazole (PRILOSEC) 20 MG capsule Take 20 mg by mouth daily.    Historical Provider, MD  PARoxetine (PAXIL) 40 MG tablet Take 40 mg by mouth every morning.    Historical Provider, MD  Vitamin D, Ergocalciferol, (DRISDOL) 50000 UNITS CAPS capsule Take 50,000 Units by mouth every 7 (seven) days.    Historical Provider, MD    Family History  Family History  Problem Relation Age of Onset  . Heart disease Mother   . Hypertension Sister   . Heart disease Sister   . Heart disease Brother   . Heart disease Father   . Peripheral vascular disease Father   . Heart disease Maternal Grandmother   . Heart attack Maternal Grandmother   . Colon cancer Neg Hx   . Breast cancer Maternal Grandmother   . Stomach cancer Maternal Uncle     Social History  History   Social History  . Marital Status: Single    Spouse Name: N/A  . Number of Children: 0  . Years of Education: N/A   Occupational History  .     Social History Main Topics  .  Smoking status: Former Smoker -- 1.00 packs/day for 42 years    Types: Cigarettes    Quit date: 11/10/2010  . Smokeless tobacco: Former Systems developer    Quit date: 10/02/2010  . Alcohol Use: 0.0 oz/week    0 Standard drinks or equivalent per week     Comment: rare  . Drug Use: No  . Sexual Activity: Not on file   Other Topics Concern  . Not on file   Social History Narrative   Released from prison in 2003 after serving 2 years for indecency with a minor.    He smokes cigarettes greater than 30 years, also smoked a pipe.  Quit tobacco 09/2010.   Denies EtOH and drugs.      Review of Systems UNable to obtain  Physical Exam  Blood pressure 118/55, pulse 71, temperature 99.4 F (37.4 C), temperature source Oral, resp. rate 13, SpO2 97 %.  General: Abnormal faces Psych: Normal affect. Neuro: Alert. Moves all extremities spontaneously. Speech difficulties, likely related to MR HEENT: BiPAP  Neck: Unable to assess d/t habitus Lungs:  Breathing comfortably on BiPAP. Clear lungs anteriorly.  Heart: RRR no s3, s4, or murmurs. Abdomen: Soft, non-tender, non-distended, BS + x 4.  Extremities: No clubbing, cyanosis. Trace LE edema.  Labs  Troponin [facility] of Care Test)  Recent Labs  10/26/14 0407  TROPIPOC 0.10*    Recent Labs  10/26/14 0329  TROPONINI 0.12*   Lab Results  Component Value Date   WBC 14.0* 10/26/2014   HGB 10.3* 10/26/2014   HCT 33.3* 10/26/2014   MCV 82.4 10/26/2014   PLT 283 10/26/2014    Recent Labs Lab 10/26/14 0329  NA 138  K 5.5*  CL 103  CO2 28  BUN 26*  CREATININE 1.37*  CALCIUM 8.7  PROT 6.4  BILITOT 0.5  ALKPHOS 70  ALT 17  AST 21  GLUCOSE 240*   Lab Results  Component Value Date   CHOL 124 11/10/2012   HDL 24* 11/10/2012   LDLCALC 78 11/10/2012   TRIG 112 11/10/2012   Lab Results  Component Value Date   DDIMER * 07/23/2009    0.82        AT THE INHOUSE ESTABLISHED CUTOFF VALUE OF 0.48 ug/mL FEU, THIS ASSAY HAS BEEN  DOCUMENTED IN THE LITERATURE TO HAVE A SENSITIVITY AND NEGATIVE PREDICTIVE VALUE OF AT LEAST 98 TO 99%.  THE TEST RESULT SHOULD BE CORRELATED WITH AN ASSESSMENT OF THE CLINICAL PROBABILITY OF DVT / VTE.     Radiology/Studies  Dg Chest Port 1 View  10/26/2014   CLINICAL DATA:  Dyspnea  EXAM: PORTABLE CHEST - 1 VIEW  COMPARISON:  11/09/2012  FINDINGS: There is moderate cardiomegaly. There is vascular and interstitial prominence, worsened from 11/09/2012. No large effusions.  IMPRESSION: Prominent vasculature and interstitium, likely mild congestive heart failure.   Electronically Signed   By: Andreas Newport M.D.   On: 10/26/2014 04:25   10/16/12 TTE - Left ventricle: The cavity size was normal. There was mild concentric hypertrophy. Systolic function was vigorous. The estimated ejection fraction was in the range of 65% to 70%. Wall motion was normal; there were no regional wall motion abnormalities. Doppler parameters are consistent with abnormal left ventricular relaxation (grade 1 diastolic dysfunction). The E/e' ratio is >10, suggesting elevated LV filling pressure. - Left atrium: The atrium was normal in size. - Inferior vena cava: The vessel was normal in size; the respirophasic diameter changes were in the normal range (= 50%); findings are consistent with normal central venous pressure.  09/06/12 Lexiscan Myoview scan with small apical defect Seen in horizontal images only. Cannot exclude tiny region of apical ischemia vs shift soft tissue. Otherwise normal perfusion. Overall low risk scan. LV Ejection Fraction: 69%. LV Wall Motion: NL LV Function; NL Wall Motion  ECG  10/26/14 @ 3:22: NSR, RBBB, RAD, ST depression in leads V2-V6. Compared to 04/23/13, the anterolateral STD are new.   ASSESSMENT AND PLAN  23M former smoker with mental retardation, HTN, DM c/b chronic LE wounds, HLD, AF, prior stroke, mild carotid artery disease, large ventral  hernia, and COPD who presents with chest pain and SOB.  He has evidence of new onset CHF and NSTEMI.    CHF - lasix 3m IV BID - wean BiPAP as tolerated - TTE  NSTEMI. He has ECG changes, positive troponin, and chest pain. Will treat as if ACS although this may be demand. Based on Tn trend and TTE will determine whether cath is warranted.  - ASA - Hold rivaroxaban and start UFH gtt without a bolus - trend troponins - switch atorva 420mto atorva 8025m continue metoprolol  pAF currently in sinus - hold pradaxa, start UFH without a bolus - continue dilt and metoprolol  Signed, FRILamar SprinklesD 10/26/2014, 5:06 AM

## 2014-10-26 NOTE — Progress Notes (Signed)
[facility] Progress Note Patient Name: [name] DOB: Dec 17, 1955 MRN: [id number]   Date of Service  10/26/2014  HPI/Events of Note  Troponin = 5.92. Patient is known NSTEMI. Already on ASA, Lopressor and a Heparin IV infusion.  eICU Interventions  Defer cardiology consultation until family desire for aggressive Rx known in AM.      Intervention Category Intermediate Interventions: Diagnostic test evaluation  Yoseph Haile Cornelia Copa 10/26/2014, 7:22 PM

## 2014-10-26 NOTE — Progress Notes (Signed)
Pt converted to afib controlled rate in the 70's.  Raliegh Ip Schorr notified. Orders recieved

## 2014-10-26 NOTE — Progress Notes (Signed)
ANTICOAGULATION CONSULT NOTE - Follow Up Consult  Pharmacy Consult for Heparin Indication: chest pain/ACS  Allergies  Allergen Reactions  . Penicillins Hives and Nausea And Vomiting    Patient Measurements: Height: 5' (152.4 cm) (10/08/14) Weight: 242 lb 1 oz (109.8 kg) (10/08/14) IBW/kg (Calculated) : 50 Heparin Dosing Weight: 75 kg  Vital Signs: Temp: 97.8 F (36.6 C) (03/20 2031) Temp Source: Axillary (03/20 2031) BP: 131/58 mmHg (03/20 2031) Pulse Rate: 77 (03/20 2031)  Labs:  Recent Labs  10/26/14 0329 10/26/14 1033 10/26/14 1610 10/26/14 1945  HGB 10.3* 10.6*  --   --   HCT 33.3* 35.3*  --   --   PLT 283 271  --   --   APTT  --  54*  --   --   LABPROT  --  17.0*  --   --   INR  --  1.36  --   --   HEPARINUNFRC  --   --  <0.10* <0.10*  CREATININE 1.37* 1.31  --   --   TROPONINI 0.12*  --  5.92*  --     Estimated Creatinine Clearance: 62.7 mL/min (by C-G formula based on Cr of 1.31).   Assessment: 59 y.o. male with chest pain for on IV heparin Will not bolus since patient taking Pradaxa and last dose likely within last 12 hours.  Heparin level undetectable on 1200 units/hr  Goal of Therapy:  Heparin level 0.3-0.7 units/ml Monitor platelets by anticoagulation protocol: Yes   Plan:  Increase heparin rate to 1450 units/hr Recheck heparin level in AM  Maryanna Shape, PharmD, BCPS  Clinical Pharmacist  Pager: [phone]   10/26/2014,9:22 PM

## 2014-10-26 NOTE — ED Notes (Signed)
IV team, Zenia Resides, RN, to review chart and determine double or triple lumen PICC placement.  States will be here as soon as he can.

## 2014-10-26 NOTE — Progress Notes (Addendum)
ANTICOAGULATION CONSULT NOTE - Initial Consult  Pharmacy Consult for Heparin Indication: chest pain/ACS  Allergies  Allergen Reactions  . Penicillins Hives and Nausea And Vomiting    Patient Measurements:   Heparin Dosing Weight: 75 kg  Vital Signs: Temp: 99.4 F (37.4 C) (03/20 0317) Temp Source: Oral (03/20 0317) BP: 110/56 mmHg (03/20 0530) Pulse Rate: 67 (03/20 0530)  Labs:  Recent Labs  10/26/14 0329  HGB 10.3*  HCT 33.3*  PLT 283  CREATININE 1.37*  TROPONINI 0.12*    CrCl cannot be calculated (Unknown ideal weight.).   Medical History: Past Medical History  Diagnosis Date  . Hypertension   . Diabetes mellitus   . Mental retardation     Sister helps to take care of him and takes him to appts  . Tobacco user     Smokes 1ppd for multiple years.  Quit after hosp 09/2010.  Marland Kitchen Anemia     History of Iron Def Anemia  . Anxiety   . Hyperlipidemia   . Atrial fibrillation 06/2009    CHADS score 2 (HTN, DM), was not on coumadin, but now on Pradaxa for Afib  . Lung nodule   . Chronic low back pain   . CVA (cerebral infarction) 09/2010    Bilateral with Left > Right  . Abdominal hernia     Chronic, not a good surgical candidate  . Carotid artery occlusion   . Obesity   . COPD (chronic obstructive pulmonary disease)   . Abscess, abdomen 12/31/2010    Referred to Wound Care in 01/2011 because of multiple abd abscess with VERY large ventral hernia (please look at image of CT abd/pelvis 09/2010).  Because of hernia I was hesitant to I&D.      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 Stroke     right hand numbness  . GERD (gastroesophageal reflux disease)   . Pneumonia     hx of  . Shortness of breath     with ambulation  . Edema of both legs     takes Lasix  . Numbness and tingling in hands   . Itching     all over body; pt scratches and has sores on bilateral arms and abdomen  . PONV (postoperative nausea and vomiting)   .  Mental retardation     Medications:  ASA  Lasix  Lipitor Cardizem  Iron  Lantus  Metformin  Lopressor  Ntg  Prilosec  Paxil  Vit D Pradaxa 150 BID  Assessment: 59 y.o. male with chest pain for heparin Will not bolus since patient taking Pradaxa and last dose likely within last 12 hours.  Goal of Therapy:  Heparin level 0.3-0.7 units/ml Monitor platelets by anticoagulation protocol: Yes   Plan:  Start heparin 1200 units/hr Check heparin level in 8 hours.   Abbott, Bronson Curb 10/26/2014,5:46 AM  Addendum: Heparin level undetectable, per RN, heparin was stopped this morning for PICC line placement. And restarted sometime this afternoon probably around 2 PM before transferred to 3S.   Plan:  - Will not change heparin rate for now. - Recheck heparin level at [place], PharmD, BCPS  Clinical Pharmacist  Pager: [phone]

## 2014-10-26 NOTE — Progress Notes (Signed)
ANTIBIOTIC CONSULT NOTE - INITIAL  Pharmacy Consult for aztreonam and vancomycin Indication: rule out sepsis  Allergies  Allergen Reactions  . Penicillins Hives and Nausea And Vomiting    Patient Measurements: Height: 5' (152.4 cm) (10/08/14) Weight: 242 lb 1 oz (109.8 kg) (10/08/14) IBW/kg (Calculated) : 50   Vital Signs: Temp: 100.2 F (37.9 C) (03/20 1011) Temp Source: Rectal (03/20 1011) BP: 123/53 mmHg (03/20 1100) Pulse Rate: 73 (03/20 1100) Intake/Output from previous day:   Intake/Output from this shift:    Labs:  Recent Labs  10/26/14 0329  WBC 14.0*  HGB 10.3*  PLT 283  CREATININE 1.37*   Estimated Creatinine Clearance: 59.9 mL/min (by C-G formula based on Cr of 1.37). No results for input(s): VANCOTROUGH, VANCOPEAK, VANCORANDOM, GENTTROUGH, GENTPEAK, GENTRANDOM, TOBRATROUGH, TOBRAPEAK, TOBRARND, AMIKACINPEAK, AMIKACINTROU, AMIKACIN in the last 72 hours.   Microbiology: No results found for this or any previous visit (from the past 720 hour(s)).  Medical History: Past Medical History  Diagnosis Date  . Hypertension   . Diabetes mellitus   . Mental retardation     Sister helps to take care of him and takes him to appts  . Tobacco user     Smokes 1ppd for multiple years.  Quit after hosp 09/2010.  Marland Kitchen Anemia     History of Iron Def Anemia  . Anxiety   . Hyperlipidemia   . Atrial fibrillation 06/2009    CHADS score 2 (HTN, DM), was not on coumadin, but now on Pradaxa for Afib  . Lung nodule   . Chronic low back pain   . CVA (cerebral infarction) 09/2010    Bilateral with Left > Right  . Abdominal hernia     Chronic, not a good surgical candidate  . Carotid artery occlusion   . Obesity   . COPD (chronic obstructive pulmonary disease)   . Abscess, abdomen 12/31/2010    Referred to Wound Care in 01/2011 because of multiple abd abscess with VERY large ventral hernia (please look at image of CT abd/pelvis 09/2010).  Because of hernia I was hesitant to I&D.       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 Stroke     right hand numbness  . GERD (gastroesophageal reflux disease)   . Pneumonia     hx of  . Shortness of breath     with ambulation  . Edema of both legs     takes Lasix  . Numbness and tingling in hands   . Itching     all over body; pt scratches and has sores on bilateral arms and abdomen  . PONV (postoperative nausea and vomiting)   . Mental retardation    Assessment: 59 yo M in ED with CC of SOB.  Pharmacy consulted to dose aztreonam and vancomycin for sepsis of unclear source.  No abx PTA.  WBC 14, creat 1.37, wt 109.8 kg, normalized creat cl ~ 58 ml/min. Temp 100.2; LA 2.36>1.53>1.5 before abx started.  Pt to get PICC line placed for abx administration.  Has IV heparin running via small peripheral IV. In ED pt hypoxic, O2 sats mid 80s, placed on BiPAP.  CXR concerning for pulmonary vascular congestion.    Goal of Therapy:  Vancomycin trough level 15-20 mcg/ml  Plan:  -aztreonam 2 gm IV x 1 dose in ED, then 2 gm IV q8h -vancomycin 2 gm IV x 1 dose in ED, then 750 mg IV q12h -f/u renal fxn,  wbc, temp, culture data -steady-state vancomycin trough as needed  Eudelia Bunch, Pharm.D. [phone] 10/26/2014 11:34 AM

## 2014-10-26 NOTE — ED Notes (Signed)
IV team at bedside 

## 2014-10-26 NOTE — ED Provider Notes (Addendum)
CSN: [id number]     Arrival date & time 10/26/14  0317 History  This chart was scribed for Linton Flemings, MD by Rayfield Citizen, ED Scribe. This patient was seen in room A11C/A11C and the patient's care was started at 3:29 AM.    Chief Complaint  Patient presents with  . Chest Pain  . Shortness of Breath   The history is provided by the patient and the EMS personnel. No language interpreter was used.    HPI Comments: [name] is a 59 y.o. male with past medical history of MR, HTN, HLD, T2DM,  CVA, COPD, a-fib, abdominal hernia who presents to the Emergency Department complaining of SOB and chest tightness that woke him from sleep around 00:30 tonight. Per EMS, patient woke in a coughing fit with post-tussive emesis; patient reported 2-3 days of productive cough. He denies recent swelling to his lower extremities.   Past Medical History  Diagnosis Date  . Hypertension   . Diabetes mellitus   . Mental retardation     Sister helps to take care of him and takes him to appts  . Tobacco user     Smokes 1ppd for multiple years.  Quit after hosp 09/2010.  Marland Kitchen Anemia     History of Iron Def Anemia  . Anxiety   . Hyperlipidemia   . Atrial fibrillation 06/2009    CHADS score 2 (HTN, DM), was not on coumadin, but now on Pradaxa for Afib  . Lung nodule   . Chronic low back pain   . CVA (cerebral infarction) 09/2010    Bilateral with Left > Right  . Abdominal hernia     Chronic, not a good surgical candidate  . Carotid artery occlusion   . Obesity   . COPD (chronic obstructive pulmonary disease)   . Abscess, abdomen 12/31/2010    Referred to Wound Care in 01/2011 because of multiple abd abscess with VERY large ventral hernia (please look at image of CT abd/pelvis 09/2010).  Because of hernia I was hesitant to I&D.      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 Stroke     right hand numbness  . GERD (gastroesophageal reflux disease)   . Pneumonia     hx of  .  Shortness of breath     with ambulation  . Edema of both legs     takes Lasix  . Numbness and tingling in hands   . Itching     all over body; pt scratches and has sores on bilateral arms and abdomen  . PONV (postoperative nausea and vomiting)   . Mental retardation    Past Surgical History  Procedure Laterality Date  . Carotid arteriogram  10/2010    30% right ICA stenosis, 40% left ICA stenosis   . Transesophageal echocardiogram  09/2010    No ASD or PFO. EF 60-65%.  Normal systolic function. No evidence of thrombus.   . Transthoracic echocardiogram  09/2010     The cavity size was normal. Systolic function was vigorous.  EF 65-70%.  Normal wall funciton.   . Debridment of decubitus ulcer Right 02/13/2013  . I&d extremity Right 02/13/2013    Procedure: IRRIGATION AND DEBRIDEMENT FOOT ULCER;  Surgeon: Johnny Bridge, MD;  Location: [facility];  Service: Orthopedics;  Laterality: Right;  PULSE LAVAGE  . Cataract extraction, bilateral    . Arm surgery Left     as a child  . Amputation  Right 03/29/2013    Procedure: AMPUTATION RAY;  Surgeon: Newt Minion, MD;  Location: [facility];  Service: Orthopedics;  Laterality: Right;  Right Foot 3rd and Possible 4th Ray Amputation  . Multiple tooth extractions    . Amputation Right 04/23/2013    Procedure: AMPUTATION RIGHT MID-FOOT;  Surgeon: Newt Minion, MD;  Location: [facility];  Service: Orthopedics;  Laterality: Right;  . Esophagogastroduodenoscopy (egd) with propofol N/A 06/10/2014    Procedure: ESOPHAGOGASTRODUODENOSCOPY (EGD) WITH PROPOFOL;  Surgeon: Jerene Bears, MD;  Location: [facility];  Service: Gastroenterology;  Laterality: N/A;  . Colonoscopy with propofol N/A 06/10/2014    Procedure: COLONOSCOPY WITH PROPOFOL;  Surgeon: Jerene Bears, MD;  Location: [facility];  Service: Gastroenterology;  Laterality: N/A;  . Givens capsule study N/A 07/09/2014    Procedure: GIVENS CAPSULE STUDY;  Surgeon: Jerene Bears, MD;  Location: [facility];  Service:  Gastroenterology;  Laterality: N/A;   Family History  Problem Relation Age of Onset  . Heart disease Mother   . Hypertension Sister   . Heart disease Sister   . Heart disease Brother   . Heart disease Father   . Peripheral vascular disease Father   . Heart disease Maternal Grandmother   . Heart attack Maternal Grandmother   . Colon cancer Neg Hx   . Breast cancer Maternal Grandmother   . Stomach cancer Maternal Uncle    History  Substance Use Topics  . Smoking status: Former Smoker -- 1.00 packs/day for 42 years    Types: Cigarettes    Quit date: 11/10/2010  . Smokeless tobacco: Former Systems developer    Quit date: 10/02/2010  . Alcohol Use: 0.0 oz/week    0 Standard drinks or equivalent per week     Comment: rare    Review of Systems  Respiratory: Positive for cough, chest tightness and shortness of breath.   Gastrointestinal: Positive for vomiting.  All other systems reviewed and are negative.     Allergies  Penicillins  Home Medications   Prior to Admission medications   Medication Sig Start Date End Date Taking? Authorizing Provider  aspirin EC 81 MG EC tablet Take 1 tablet (81 mg total) by mouth daily. 11/11/12   Kandis Nab, MD  atorvastatin (LIPITOR) 40 MG tablet Take 40 mg by mouth daily.    Historical Provider, MD  dabigatran (PRADAXA) 150 MG CAPS Take 150 mg by mouth every 12 (twelve) hours.    Historical Provider, MD  diltiazem (CARDIZEM CD) 240 MG 24 hr capsule Take 240 mg by mouth daily.    Historical Provider, MD  ferrous gluconate (FERGON) 325 MG tablet Take 325 mg by mouth 2 (two) times daily.    Historical Provider, MD  furosemide (LASIX) 40 MG tablet Take 1 tablet (40 mg total) by mouth daily. 11/12/12   Leeanne Rio, MD  gabapentin (NEURONTIN) 300 MG capsule Take 300 mg by mouth 2 (two) times daily.    Historical Provider, MD  glucose blood test strip Use as instructed 12/13/12   Leeanne Rio, MD  HYDROcodone-acetaminophen [facility]) 5-325 MG per  tablet Take 1 tablet by mouth every 6 (six) hours as needed for pain. 04/25/13   Newt Minion, MD  hydrocortisone 2.5 % ointment Apply topically 3 (three) times daily. To leg around wound. Do not apply to broken skin. 06/05/12   Leeanne Rio, MD  insulin glargine (LANTUS) 100 UNIT/ML injection Inject 40 Units into the skin at bedtime.  Historical Provider, MD  insulin glargine (LANTUS) 100 UNIT/ML injection Inject 60 Units into the skin daily with breakfast.    Historical Provider, MD  Lancets [facility] ULTRASOFT) lancets Use as instructed 12/13/12   Leeanne Rio, MD  metFORMIN (GLUCOPHAGE) 1000 MG tablet Take 1,000 mg by mouth 2 (two) times daily with a meal.    Historical Provider, MD  metoprolol tartrate (LOPRESSOR) 25 MG tablet Take 25 mg by mouth 2 (two) times daily.    Historical Provider, MD  MOVIPREP 100 G SOLR Take 1 kit (200 g total) by mouth once. 04/29/14   Jerene Bears, MD  nitroGLYCERIN (NITROSTAT) 0.4 MG SL tablet Place 0.4 mg under the tongue every 5 (five) minutes as needed for chest pain.  07/23/12   Leeanne Rio, MD  omeprazole (PRILOSEC) 20 MG capsule Take 20 mg by mouth daily.    Historical Provider, MD  PARoxetine (PAXIL) 40 MG tablet Take 40 mg by mouth every morning.    Historical Provider, MD  Vitamin D, Ergocalciferol, (DRISDOL) 50000 UNITS CAPS capsule Take 50,000 Units by mouth every 7 (seven) days.    Historical Provider, MD   BP 140/68 mmHg  Pulse 91  Temp(Src) 99.4 F (37.4 C) (Oral)  Resp 34  SpO2 91% Physical Exam  Constitutional: He is oriented to person, place, and time. He appears well-developed and well-nourished. No distress.  Chronically ill appearing  HENT:  Head: Normocephalic and atraumatic.  Mouth/Throat: Oropharynx is clear and moist. No oropharyngeal exudate.  Moist mucous membranes  Eyes: EOM are normal. Pupils are equal, round, and reactive to light.  Neck: Normal range of motion. Neck supple. No JVD present.   Cardiovascular: Normal rate, regular rhythm and normal heart sounds.  Exam reveals no gallop and no friction rub.   No murmur heard. Pulmonary/Chest: Effort normal. No respiratory distress. He has wheezes. He has rales.  Coarse lung sounds with a wheeze; shallow inspiration; difficult to hear in the bases  Abdominal: Soft. Bowel sounds are normal. He exhibits distension. He exhibits no mass. There is no tenderness. There is no rebound and no guarding.  Large ventral wall hernia  Musculoskeletal: Normal range of motion. He exhibits no edema.  Moves all extremities normally.   Lymphadenopathy:    He has no cervical adenopathy.  Neurological: He is alert and oriented to person, place, and time. He displays normal reflexes.  Skin: Skin is warm and dry. No rash noted.  3cm shallow ulceration to his abdomen; no peripheral edema   Psychiatric: He has a normal mood and affect. His behavior is normal.  Nursing note and vitals reviewed.   ED Course  Procedures   DIAGNOSTIC STUDIES: Oxygen Saturation is 91% on [place] 4L/min, low by my interpretation.    COORDINATION OF CARE: 3:36 AM Discussed treatment plan with pt at bedside and pt agreed to plan.   Labs Review Labs Reviewed  COMPREHENSIVE METABOLIC PANEL - Abnormal; Notable for the following:    Potassium 5.5 (*)    Glucose, Bld 240 (*)    BUN 26 (*)    Creatinine, Ser 1.37 (*)    Albumin 3.0 (*)    GFR calc non Af Amer 55 (*)    GFR calc Af Amer 63 (*)    All other components within normal limits  TROPONIN I - Abnormal; Notable for the following:    Troponin I 0.12 (*)    All other components within normal limits  CBC WITH DIFFERENTIAL/PLATELET - Abnormal; Notable  for the following:    WBC 14.0 (*)    RBC 4.04 (*)    Hemoglobin 10.3 (*)    HCT 33.3 (*)    MCH 25.5 (*)    Neutro Abs 10.8 (*)    Lymphocytes Relative 11 (*)    Eosinophils Relative 6 (*)    Eosinophils Absolute 0.9 (*)    All other components within normal limits   BRAIN NATRIURETIC PEPTIDE - Abnormal; Notable for the following:    B Natriuretic Peptide 345.2 (*)    All other components within normal limits  I-STAT TROPOININ, ED - Abnormal; Notable for the following:    Troponin i, poc 0.10 (*)    All other components within normal limits  I-STAT CG4 LACTIC ACID, ED - Abnormal; Notable for the following:    Lactic Acid, Venous 2.36 (*)    All other components within normal limits  CULTURE, BLOOD (ROUTINE X 2)  CULTURE, BLOOD (ROUTINE X 2)  LIPASE, BLOOD    Imaging Review Dg Chest Port 1 View  10/26/2014   CLINICAL DATA:  Dyspnea  EXAM: PORTABLE CHEST - 1 VIEW  COMPARISON:  11/09/2012  FINDINGS: There is moderate cardiomegaly. There is vascular and interstitial prominence, worsened from 11/09/2012. No large effusions.  IMPRESSION: Prominent vasculature and interstitium, likely mild congestive heart failure.   Electronically Signed   By: Andreas Newport M.D.   On: 10/26/2014 04:25     EKG Interpretation   Date/Time:  Sunday October 26 2014 03:22:25 EDT Ventricular Rate:  81 PR Interval:  167 QRS Duration: 146 QT Interval:  413 QTC Calculation: 479 R Axis:   108 Text Interpretation:  Sinus rhythm RBBB and LPFB ST depr, consider  ischemia, anterolateral lds ST depression new from prior Confirmed by  Dvora Buitron  MD, Britanny Marksberry ([id number]) on 10/26/2014 3:27:51 AM     CRITICAL CARE Performed by: Kalman Drape Total critical care time: 60 min Critical care time was exclusive of separately billable procedures and treating other patients. Critical care was necessary to treat or prevent imminent or life-threatening deterioration. Critical care was time spent personally by me on the following activities: development of treatment plan with patient and/or surrogate as well as nursing, discussions with consultants, evaluation of patient's response to treatment, examination of patient, obtaining history from patient or surrogate, ordering and performing treatments and  interventions, ordering and review of laboratory studies, ordering and review of radiographic studies, pulse oximetry and re-evaluation of patient's condition.  MDM   Final diagnoses:  Acute congestive heart failure, unspecified congestive heart failure type  Elevated troponin  ST segment depression  AKI (acute kidney injury)   I personally performed the services described in this documentation, which was scribed in my presence. The recorded information has been reviewed and is accurate.  59 year old male who presents to the emergency department from home via EMS with complaint of shortness of breath, worse tonight, chest pain.  Patient has had cough and shortness of breath last 2-3 days.  Patient received aspirin and nitroglycerin in route.  Patient reports he has history of heart problems, but cannot give further information.  Patient has history of MR, A. fib, stroke, large ventral wall hernia.  He carries diagnosis of COPD and CHF, but echo from 2014 reports ejection fraction is 70%.  Patient hypoxic upon arrival, EKG with ST depressions new from prior.  He has elevation in his troponin.  Case was discussed with on-call cardiology who will see patient.  Suspect acute CHF with troponin  leak.  Patient denies chest pain at this time.  He is improved on BiPAP.     Linton Flemings, MD 10/26/14 0524  5:55 AM Patient has been seen by cardiology.  Given his other multiple medical problems they will consult, but feel he needs hospitalist admission.  Recommend IV heparin and echocardiogram.  Patient with his multiple comorbidities, is not a great candidate for heart catheterization.  Brief.  Off BiPAP showed patient to drop his sats into the 70s.  Will remain on BiPAP.  Linton Flemings, MD 10/26/14 1117  Linton Flemings, MD 10/26/14 [id number]

## 2014-10-26 NOTE — H&P (Addendum)
[facility] Admission History and Physical       [name] HFG:[id number] DOB: Mar 25, 1956 DOA: 10/26/2014  Referring physician: EDP PCP: [name], MD  Specialists:   Chief Complaint: SOB  HPI: [name] is a 59 y.o. male with a history of Mental Retardation, Diastolic CHF, Atrial Fibrillation, HTN, DM2, and who presents to the ED with complaints of worsening SOB over the past 2-3 days.     He was found to have hypoxia in the ED, and was placed on BiPAP.   He was evaluated and found to be in Acute CHF, he was aslo found to have an elevated Troponin at 0.12 and cardiology was consulted and saw the patient and made recommendations.  He was referred for admission.       Review of Systems: Unable to Obtain from the Patient  Past Medical History  Diagnosis Date  . Hypertension   . Diabetes mellitus   . Mental retardation     Sister helps to take care of him and takes him to appts  . Tobacco user     Smokes 1ppd for multiple years.  Quit after hosp 09/2010.  Marland Kitchen Anemia     History of Iron Def Anemia  . Anxiety   . Hyperlipidemia   . Atrial fibrillation 06/2009    CHADS score 2 (HTN, DM), was not on coumadin, but now on Pradaxa for Afib  . Lung nodule   . Chronic low back pain   . CVA (cerebral infarction) 09/2010    Bilateral with Left > Right  . Abdominal hernia     Chronic, not a good surgical candidate  . Carotid artery occlusion   . Obesity   . COPD (chronic obstructive pulmonary disease)   . Abscess, abdomen 12/31/2010    Referred to Wound Care in 01/2011 because of multiple abd abscess with VERY large ventral hernia (please look at image of CT abd/pelvis 09/2010).  Because of hernia I was hesitant to I&D.      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 Stroke     right hand numbness  . GERD (gastroesophageal reflux disease)   . Pneumonia     hx of  . Shortness of breath     with ambulation  . Edema of both  legs     takes Lasix  . Numbness and tingling in hands   . Itching     all over body; pt scratches and has sores on bilateral arms and abdomen  . PONV (postoperative nausea and vomiting)   . Mental retardation      Past Surgical History  Procedure Laterality Date  . Carotid arteriogram  10/2010    30% right ICA stenosis, 40% left ICA stenosis   . Transesophageal echocardiogram  09/2010    No ASD or PFO. EF 60-65%.  Normal systolic function. No evidence of thrombus.   . Transthoracic echocardiogram  09/2010     The cavity size was normal. Systolic function was vigorous.  EF 65-70%.  Normal wall funciton.   . Debridment of decubitus ulcer Right 02/13/2013  . I&d extremity Right 02/13/2013    Procedure: IRRIGATION AND DEBRIDEMENT FOOT ULCER;  Surgeon: Johnny Bridge, MD;  Location: [place];  Service: Orthopedics;  Laterality: Right;  PULSE LAVAGE  . Cataract extraction, bilateral    . Arm surgery Left     as a child  . Amputation Right 03/29/2013    Procedure: AMPUTATION  RAY;  Surgeon: Newt Minion, MD;  Location: [place];  Service: Orthopedics;  Laterality: Right;  Right Foot 3rd and Possible 4th Ray Amputation  . Multiple tooth extractions    . Amputation Right 04/23/2013    Procedure: AMPUTATION RIGHT MID-FOOT;  Surgeon: Newt Minion, MD;  Location: [place];  Service: Orthopedics;  Laterality: Right;  . Esophagogastroduodenoscopy (egd) with propofol N/A 06/10/2014    Procedure: ESOPHAGOGASTRODUODENOSCOPY (EGD) WITH PROPOFOL;  Surgeon: Jerene Bears, MD;  Location: [facility];  Service: Gastroenterology;  Laterality: N/A;  . Colonoscopy with propofol N/A 06/10/2014    Procedure: COLONOSCOPY WITH PROPOFOL;  Surgeon: Jerene Bears, MD;  Location: [facility];  Service: Gastroenterology;  Laterality: N/A;  . Givens capsule study N/A 07/09/2014    Procedure: GIVENS CAPSULE STUDY;  Surgeon: Jerene Bears, MD;  Location: [facility];  Service: Gastroenterology;  Laterality: N/A;      Prior to  Admission medications   Medication Sig Start Date End Date Taking? Authorizing Provider  aspirin EC 81 MG EC tablet Take 1 tablet (81 mg total) by mouth daily. 11/11/12   Kandis Nab, MD  atorvastatin (LIPITOR) 40 MG tablet Take 40 mg by mouth daily.    Historical Provider, MD  dabigatran (PRADAXA) 150 MG CAPS Take 150 mg by mouth every 12 (twelve) hours.    Historical Provider, MD  diltiazem (CARDIZEM CD) 240 MG 24 hr capsule Take 240 mg by mouth daily.    Historical Provider, MD  ferrous gluconate (FERGON) 325 MG tablet Take 325 mg by mouth 2 (two) times daily.    Historical Provider, MD  furosemide (LASIX) 40 MG tablet Take 1 tablet (40 mg total) by mouth daily. 11/12/12   Leeanne Rio, MD  gabapentin (NEURONTIN) 300 MG capsule Take 300 mg by mouth 2 (two) times daily.    Historical Provider, MD  glucose blood test strip Use as instructed 12/13/12   Leeanne Rio, MD  HYDROcodone-acetaminophen [facility]) 5-325 MG per tablet Take 1 tablet by mouth every 6 (six) hours as needed for pain. 04/25/13   Newt Minion, MD  hydrocortisone 2.5 % ointment Apply topically 3 (three) times daily. To leg around wound. Do not apply to broken skin. 06/05/12   Leeanne Rio, MD  insulin glargine (LANTUS) 100 UNIT/ML injection Inject 40 Units into the skin at bedtime.     Historical Provider, MD  insulin glargine (LANTUS) 100 UNIT/ML injection Inject 60 Units into the skin daily with breakfast.    Historical Provider, MD  Lancets [facility] ULTRASOFT) lancets Use as instructed 12/13/12   Leeanne Rio, MD  metFORMIN (GLUCOPHAGE) 1000 MG tablet Take 1,000 mg by mouth 2 (two) times daily with a meal.    Historical Provider, MD  metoprolol tartrate (LOPRESSOR) 25 MG tablet Take 25 mg by mouth 2 (two) times daily.    Historical Provider, MD  MOVIPREP 100 G SOLR Take 1 kit (200 g total) by mouth once. 04/29/14   Jerene Bears, MD  nitroGLYCERIN (NITROSTAT) 0.4 MG SL tablet Place 0.4 mg under the tongue  every 5 (five) minutes as needed for chest pain.  07/23/12   Leeanne Rio, MD  omeprazole (PRILOSEC) 20 MG capsule Take 20 mg by mouth daily.    Historical Provider, MD  PARoxetine (PAXIL) 40 MG tablet Take 40 mg by mouth every morning.    Historical Provider, MD  Vitamin D, Ergocalciferol, (DRISDOL) 50000 UNITS CAPS capsule Take 50,000 Units by mouth  every 7 (seven) days.    Historical Provider, MD     Allergies  Allergen Reactions  . Penicillins Hives and Nausea And Vomiting    Social History:  reports that he quit smoking about 3 years ago. His smoking use included Cigarettes. He has a 42 pack-year smoking history. He quit smokeless tobacco use about 4 years ago. He reports that he drinks alcohol. He reports that he does not use illicit drugs.    Family History  Problem Relation Age of Onset  . Heart disease Mother   . Hypertension Sister   . Heart disease Sister   . Heart disease Brother   . Heart disease Father   . Peripheral vascular disease Father   . Heart disease Maternal Grandmother   . Heart attack Maternal Grandmother   . Colon cancer Neg Hx   . Breast cancer Maternal Grandmother   . Stomach cancer Maternal Uncle        Physical Exam:  GEN: Obese  59 y.o. Caucasian male examined currently on BIPAP and in no acute distress; cooperative with exam Filed Vitals:   10/26/14 0500 10/26/14 0530 10/26/14 0630 10/26/14 0700  BP: 119/54 110/56 117/54 126/57  Pulse: 70 67 70 71  Temp:      TempSrc:      Resp: '29 25 18 17  ' SpO2: 97% 95% 95% 96%   Blood pressure 126/57, pulse 71, temperature 99.4 F (37.4 C), temperature source Oral, resp. rate 17, SpO2 96 %. PSYCH: He is alert and oriented x4; does not appear anxious does not appear depressed; affect is normal HEENT: Normocephalic and Atraumatic, Mucous membranes pink; PERRLA; EOM intact; Fundi:  Benign;  No scleral icterus, Nares: Patent, Oropharynx: Clear, ,    Neck:  FROM, No Cervical Lymphadenopathy nor  Thyromegaly or Carotid Bruit; No JVD; Breasts:: Not examined CHEST WALL: No tenderness CHEST: Normal respiration, clear to auscultation bilaterally HEART: Regular rate and rhythm; no murmurs rubs or gallops BACK: No kyphosis or scoliosis; No CVA tenderness ABDOMEN: Positive Bowel Sounds, Large Ventral Hernia,   And Annular Abrasion on ABD with 1 cm diameter,   Obese, Soft Non-Tender, No Rebound or Guarding; No Masses, No Organomegaly, No Pannus; No Intertriginous candida. Rectal Exam: Not done EXTREMITIES: + Right TMA,   LLE: Wrapped in an [employer]: not examined PULSES: 2+ and symmetric SKIN: Normal hydration no rash or ulceration CNS:  Alert and Oriented x 4, No Focal Deficits Vascular: pulses palpable throughout    Labs on Admission:  Basic Metabolic Panel:  Recent Labs Lab 10/26/14 0329  NA 138  K 5.5*  CL 103  CO2 28  GLUCOSE 240*  BUN 26*  CREATININE 1.37*  CALCIUM 8.7   Liver Function Tests:  Recent Labs Lab 10/26/14 0329  AST 21  ALT 17  ALKPHOS 70  BILITOT 0.5  PROT 6.4  ALBUMIN 3.0*    Recent Labs Lab 10/26/14 0329  LIPASE 17   No results for input(s): AMMONIA in the last 168 hours. CBC:  Recent Labs Lab 10/26/14 0329  WBC 14.0*  NEUTROABS 10.8*  HGB 10.3*  HCT 33.3*  MCV 82.4  PLT 283   Cardiac Enzymes:  Recent Labs Lab 10/26/14 0329  TROPONINI 0.12*    BNP (last 3 results)  Recent Labs  10/26/14 0329  BNP 345.2*    ProBNP (last 3 results) No results for input(s): PROBNP in the last 8760 hours.  CBG: No results for input(s): GLUCAP in the  last 168 hours.  Radiological Exams on Admission: Dg Chest Port 1 View  10/26/2014   CLINICAL DATA:  Dyspnea  EXAM: PORTABLE CHEST - 1 VIEW  COMPARISON:  11/09/2012  FINDINGS: There is moderate cardiomegaly. There is vascular and interstitial prominence, worsened from 11/09/2012. No large effusions.  IMPRESSION: Prominent vasculature and interstitium, likely mild congestive  heart failure.   Electronically Signed   By: Andreas Newport M.D.   On: 10/26/2014 04:25     EKG: Independently reviewed.    Assessment/Plan:   59 y.o. male with  Principal Problem:   1.    Acute diastolic CHF (congestive heart failure)   Acute CHF Protocol   Diurese with 80 mg of IV Lasix   Cardiac monitoring   Continue Metoprolol Rx   2D ECHO ordered    Active Problems:    2.   Acute Respiratory Failure with Hypoxia   BIPAP   Monitor O2 sats     3.    Elevated Troponin- ACS vs Demand Ischemia   Cardiac monitoring   IV Heparin drip   O2, ASA, Metoprolol         4.    Hyperkalemia   Monitor, Should decrease with IV Lasix Rx         5.   Atrial fibrillation   On Diltiazem, and Metoprolol Rx   on Pradaxa Rx,  Hold Pradaxa Rx     6.   Essential hypertension   Continue Diltiazem, and Metoprolol Rx   Monitor BPs     7.   DM (diabetes mellitus), type 2, uncontrolled   Continue Lantus Rx   SSI coverage PRN     8.   HYPERCHOLESTEROLEMIA   Continue Atorvastatin     9.   Ventral hernia/Abdominal hernia   Chronic, and Stable    10.   Iron deficiency anemia   Continue Iron Supplement     11.   Abrasion of abdominal wall   Neosporin BID   12.   DVT prophylaxis    IV Heparin drip          Code Status:     FULL CODE        Family Communication:    No Family Present    Disposition Plan:    Inpatient  Status        Time spent:  [facility] Pager [phone]   If [place] Please Contact the Day Rounding Team MD for [facility]  If 7PM-7AM, Please Contact Night-Floor Coverage  [email or web address] Password [email or web address] 10/26/2014, 7:25 AM     ADDENDUM:   Patient was seen and examined on 10/26/2014

## 2014-10-26 NOTE — Consult Note (Signed)
PULMONARY / CRITICAL CARE MEDICINE   Name: [name] MRN: [id number] DOB: 1956/02/25    ADMISSION DATE:  10/26/2014 CONSULTATION DATE:  10/26/14  REFERRING MD :  Dr Doyle Askew  Reason for Consult:  Acute on chronic respiratory failure  INITIAL PRESENTATION: 59 yo man, hx HTN, A Fib, DM, large ventral hernia, chronic LLE wound and chronic diastolic CHF. He was brought to ED w 2 days SOB, found to have B infiltrates consistent with CHF. Also with ECG changes suggestive of NSTEMI.  Lasix was ordered but never given in ED. He moved to SDU and was placed on biPAP which caused significant agitation. He subsequently was given the lasix, some haldol and ativan. Since then he has tolerated the BiPAP a bit better.   STUDIES:   SIGNIFICANT EVENTS:  PAST MEDICAL HISTORY :   has a past medical history of Hypertension; Diabetes mellitus; Mental retardation; Tobacco user; Anemia; Anxiety; Hyperlipidemia; Atrial fibrillation (06/2009); Lung nodule; Chronic low back pain; CVA (cerebral infarction) (09/2010); Abdominal hernia; Carotid artery occlusion; Obesity; COPD (chronic obstructive pulmonary disease); Abscess, abdomen (12/31/2010); 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 (02/14/2013); Stroke; GERD (gastroesophageal reflux disease); Pneumonia; Shortness of breath; Edema of both legs; Numbness and tingling in hands; Itching; PONV (postoperative nausea and vomiting); and Mental retardation.  has past surgical history that includes Carotid arteriogram (10/2010); transesophageal echocardiogram (09/2010); transthoracic echocardiogram (09/2010 ); Debridment of decubitus ulcer (Right, 02/13/2013); I&D extremity (Right, 02/13/2013); Cataract extraction, bilateral; arm surgery (Left); Amputation (Right, 03/29/2013); Multiple tooth extractions; Amputation (Right, 04/23/2013); Esophagogastroduodenoscopy (egd) with propofol (N/A, 06/10/2014); Colonoscopy with propofol (N/A, 06/10/2014); and Givens capsule study (N/A,  07/09/2014). Prior to Admission medications   Medication Sig Start Date End Date Taking? Authorizing Provider  aspirin EC 81 MG EC tablet Take 1 tablet (81 mg total) by mouth daily. 11/11/12   Kandis Nab, MD  atorvastatin (LIPITOR) 40 MG tablet Take 40 mg by mouth daily.    Historical Provider, MD  dabigatran (PRADAXA) 150 MG CAPS Take 150 mg by mouth every 12 (twelve) hours.    Historical Provider, MD  diltiazem (CARDIZEM CD) 240 MG 24 hr capsule Take 240 mg by mouth daily.    Historical Provider, MD  ferrous gluconate (FERGON) 325 MG tablet Take 325 mg by mouth 2 (two) times daily.    Historical Provider, MD  furosemide (LASIX) 40 MG tablet Take 1 tablet (40 mg total) by mouth daily. 11/12/12   Leeanne Rio, MD  gabapentin (NEURONTIN) 300 MG capsule Take 300 mg by mouth 2 (two) times daily.    Historical Provider, MD  glucose blood test strip Use as instructed 12/13/12   Leeanne Rio, MD  HYDROcodone-acetaminophen [facility]) 5-325 MG per tablet Take 1 tablet by mouth every 6 (six) hours as needed for pain. Patient not taking: Reported on 10/26/2014 04/25/13   Newt Minion, MD  hydrocortisone 2.5 % ointment Apply topically 3 (three) times daily. To leg around wound. Do not apply to broken skin. 06/05/12   Leeanne Rio, MD  insulin glargine (LANTUS) 100 UNIT/ML injection Inject 40 Units into the skin at bedtime.     Historical Provider, MD  insulin glargine (LANTUS) 100 UNIT/ML injection Inject 60 Units into the skin daily with breakfast.    Historical Provider, MD  Lancets [facility] ULTRASOFT) lancets Use as instructed 12/13/12   Leeanne Rio, MD  metFORMIN (GLUCOPHAGE) 1000 MG tablet Take 1,000 mg by mouth 2 (two) times daily with a meal.  Historical Provider, MD  metoprolol tartrate (LOPRESSOR) 25 MG tablet Take 25 mg by mouth 2 (two) times daily.    Historical Provider, MD  MOVIPREP 100 G SOLR Take 1 kit (200 g total) by mouth once. 04/29/14   Jerene Bears, MD   nitroGLYCERIN (NITROSTAT) 0.4 MG SL tablet Place 0.4 mg under the tongue every 5 (five) minutes as needed for chest pain.  07/23/12   Leeanne Rio, MD  omeprazole (PRILOSEC) 20 MG capsule Take 20 mg by mouth daily.    Historical Provider, MD  PARoxetine (PAXIL) 40 MG tablet Take 40 mg by mouth every morning.    Historical Provider, MD  Vitamin D, Ergocalciferol, (DRISDOL) 50000 UNITS CAPS capsule Take 50,000 Units by mouth every 7 (seven) days.    Historical Provider, MD   Allergies  Allergen Reactions  . Penicillins Hives and Nausea And Vomiting    FAMILY HISTORY:  indicated that his mother is deceased. He indicated that his father is deceased.  SOCIAL HISTORY:  reports that he quit smoking about 3 years ago. His smoking use included Cigarettes. He has a 42 pack-year smoking history. He quit smokeless tobacco use about 4 years ago. He reports that he drinks alcohol. He reports that he does not use illicit drugs.  REVIEW OF SYSTEMS:  As per HPI and ED notes, patient is unable to relate a history  SUBJECTIVE:  Denies pain, says his breathing is better and that he is willing to wear the BiPAP for a few hours to see how it helps  VITAL SIGNS: Temp:  [97.5 F (36.4 C)-100.2 F (37.9 C)] 100 F (37.8 C) (03/20 1434) Pulse Rate:  [4-141] 4 (03/20 1408) Resp:  [11-34] 34 (03/20 1408) BP: (105-148)/(51-80) 148/62 mmHg (03/20 1408) SpO2:  [88 %-97 %] 97 % (03/20 1408) Weight:  [109.8 kg (242 lb 1 oz)] 109.8 kg (242 lb 1 oz) (03/20 1100) HEMODYNAMICS:   VENTILATOR SETTINGS:   INTAKE / OUTPUT: No intake or output data in the 24 hours ending 10/26/14 1728  PHYSICAL EXAMINATION: General:  Ill appearing man, laying supine on biPAP Neuro:  Awake, responds to some questions but not all, follows commands HEENT:  OP moist, biPAP in place Cardiovascular:  Regular, no M, distant Lungs:  B insp wheezes Abdomen:  Very large ventral hernia, mobile. There is a 2cm abrasion on the anterior  abd wall without warmth or erythemia.  Musculoskeletal:  R mid foot amputation Skin:  L LE is wrapped in wound pressure dressing.   LABS:  CBC  Recent Labs Lab 10/26/14 0329 10/26/14 1033  WBC 14.0* 12.6*  HGB 10.3* 10.6*  HCT 33.3* 35.3*  PLT 283 271   Coag's  Recent Labs Lab 10/26/14 1033  APTT 54*  INR 1.36   BMET  Recent Labs Lab 10/26/14 0329 10/26/14 1033  NA 138 139  K 5.5* 5.1  CL 103 100  CO2 28 29  BUN 26* 26*  CREATININE 1.37* 1.31  GLUCOSE 240* 195*   Electrolytes  Recent Labs Lab 10/26/14 0329 10/26/14 1033  CALCIUM 8.7 8.9   Sepsis Markers  Recent Labs Lab 10/26/14 0410 10/26/14 0636 10/26/14 1000  LATICACIDVEN 2.36* 1.53 1.5   ABG  Recent Labs Lab 10/26/14 1047  PHART 7.356  PCO2ART 51.7*  PO2ART 63.0*   Liver Enzymes  Recent Labs Lab 10/26/14 0329  AST 21  ALT 17  ALKPHOS 70  BILITOT 0.5  ALBUMIN 3.0*   Cardiac Enzymes  Recent Labs Lab 10/26/14  0329  TROPONINI 0.12*   Glucose  Recent Labs Lab 10/26/14 0818 10/26/14 1444 10/26/14 1525  GLUCAP 198* 154* 176*    Imaging No results found.   ASSESSMENT / PLAN:  PULMONARY A: Acute on chronic respiratory failure  Bilateral pulmonary infiltrates - suspect cardiogenic pulmonary edema. Consider also bacterial or viral PNA, ALI if he is infected elsewhere, for example LLE Presumed Obesity hypoventilation syndrome.  P:   Favor BiPAP as he can tolerate. If he gets agitated and can not be managed with low dose benzos or haldol then I would favor trial off BiPAP. I have discussed MV and intubation with his sister and recommended against it. I will place DNI orders in the chart.  Diuresis initiated Empiric abx started Flu screen pending  CARDIOVASCULAR PICC 3/20 >>  A: A Fib HTN Acute on chronic diastolic CHF Acute NSTEMI P:  Diuresis and BP control as ordered Diltiazem, lipitor, ASA Heparin gtt   RENAL A:  Acute renal insufficiency P:    Diuresis as his BP and renal fxn will tolerate Follow UOP and BMP  GASTROINTESTINAL A:  Large ventral hernia, does not appear to be an acute issue Ventral abd abrasion, without evidence surrounding cellulitis P:   protonix for SUP  HEMATOLOGIC A:  No acute issues P:   INFECTIOUS A:  Possible viral or bacterial PNA Consider other potential sources infxn > especially L Leg, possibly intra-abdominal although exam is reassuring P:   BCx2 3/20 >>  UC 3/20 >>   vanco 3/20 >>  Aztreonam 3/20 >>   ENDOCRINE A:  DM   P:   lantus 40 SSI protocol  NEUROLOGIC A:  Mental retardation at baseline - this will affect ability to tolerate either BiPAp or MV Mild lethargy s/p haldol and ativan P:   RASS goal: 0 Haldol and ativan prn, but careful not to oversedate. May be better off giving him breaks from BiPAP than sedating him so he will wear it.    FAMILY  - Updates:  I spoke with Derrel's sister and caretaker [name] this pm. Explained his current illness and status, explained that he appears to be stabilizing but that he could get worse. In particular we discussed escalation of his care should he decline. It is unclear to me that he would survive to a successful extubation should he require intubation and mechanical ventilation. I am confident that such a hospitalization would likely commit him to a significant change in subsequent QOL. I have recommended that we continue and initiate all aggressive care to stabilize and reverse his conditions with the exception of invasive ventilation or CPR. [name] is going to think about this issue, discuss with other family. For now we have agreed that the only limitation that we will place on his care is that he would not be intubated. I expect that we will need to revisit this with her in the coming days to insure that she is firm in this decision after speaking with other family. For now I will enter DNI orders in the   - Inter-disciplinary family meet  or Palliative Care meeting due by: 11/02/14    TODAY'S SUMMARY:     Independent CC time 51 minutes   Baltazar Apo, MD, PhD 10/26/2014, 6:06 PM [facility] [phone] or if no answer [phone]

## 2014-10-26 NOTE — Progress Notes (Signed)
Peripherally Inserted Central Catheter/Midline Placement  The IV Nurse has discussed with the patient and/or persons authorized to consent for the patient, the purpose of this procedure and the potential benefits and risks involved with this procedure.  The benefits include less needle sticks, lab draws from the catheter and patient may be discharged home with the catheter.  Risks include, but not limited to, infection, bleeding, blood clot (thrombus formation), and puncture of an artery; nerve damage and irregular heat beat.  Alternatives to this procedure were also discussed.  PICC/Midline Placement Documentation  PICC Triple Lumen 10/26/14 PICC Right Cephalic 36 cm 0 cm (Active)  Indication for Insertion or Continuance of Line Limited venous access - need for IV therapy >5 days (PICC only) 10/26/2014 12:00 PM  Exposed Catheter (cm) 0 cm 10/26/2014 12:00 PM  Lumen #1 Status Flushed;Saline locked;Blood return noted 10/26/2014 12:00 PM  Lumen #2 Status Flushed;Saline locked;Blood return noted 10/26/2014 12:00 PM  Lumen #3 Status Flushed;Saline locked;Blood return noted 10/26/2014 12:00 PM  Dressing Change Due 11/02/14 10/26/2014 12:00 PM       [name] 10/26/2014, 12:16 PM

## 2014-10-26 NOTE — Progress Notes (Signed)
Afib, rate in the 120'130's.  Raliegh Ip Schorr notified.  Orders received.

## 2014-10-27 ENCOUNTER — Encounter (HOSPITAL_COMMUNITY): Payer: Self-pay | Admitting: *Deleted

## 2014-10-27 DIAGNOSIS — I480 Paroxysmal atrial fibrillation: Secondary | ICD-10-CM

## 2014-10-27 DIAGNOSIS — I509 Heart failure, unspecified: Secondary | ICD-10-CM

## 2014-10-27 LAB — BASIC METABOLIC PANEL
Anion gap: 8 (ref 5–15)
BUN: 29 mg/dL — AB (ref 6–23)
CALCIUM: 8.6 mg/dL (ref 8.4–10.5)
CHLORIDE: 104 mmol/L (ref 96–112)
CO2: 29 mmol/L (ref 19–32)
Creatinine, Ser: 1.17 mg/dL (ref 0.50–1.35)
GFR calc non Af Amer: 66 mL/min — ABNORMAL LOW (ref >90)
GFR, EST AFRICAN AMERICAN: 77 mL/min — AB (ref >90)
Glucose, Bld: 185 mg/dL — ABNORMAL HIGH (ref 70–99)
Potassium: 4.4 mmol/L (ref 3.5–5.1)
Sodium: 141 mmol/L (ref 135–145)

## 2014-10-27 LAB — CBC
HCT: 34.4 % — ABNORMAL LOW (ref 39.0–52.0)
HEMOGLOBIN: 10.3 g/dL — AB (ref 13.0–17.0)
MCH: 24.6 pg — ABNORMAL LOW (ref 26.0–34.0)
MCHC: 29.9 g/dL — ABNORMAL LOW (ref 30.0–36.0)
MCV: 82.1 fL (ref 78.0–100.0)
Platelets: 278 10*3/uL (ref 150–400)
RBC: 4.19 MIL/uL — ABNORMAL LOW (ref 4.22–5.81)
RDW: 15.4 % (ref 11.5–15.5)
WBC: 12.5 10*3/uL — ABNORMAL HIGH (ref 4.0–10.5)

## 2014-10-27 LAB — GLUCOSE, CAPILLARY
GLUCOSE-CAPILLARY: 197 mg/dL — AB (ref 70–99)
GLUCOSE-CAPILLARY: 192 mg/dL — AB (ref 70–99)
GLUCOSE-CAPILLARY: 154 mg/dL — AB (ref 70–99)
GLUCOSE-CAPILLARY: 166 mg/dL — AB (ref 70–99)
Glucose-Capillary: 178 mg/dL — ABNORMAL HIGH (ref 70–99)
Glucose-Capillary: 167 mg/dL — ABNORMAL HIGH (ref 70–99)
Glucose-Capillary: 148 mg/dL — ABNORMAL HIGH (ref 70–99)

## 2014-10-27 LAB — URINE CULTURE

## 2014-10-27 LAB — HEPARIN LEVEL (UNFRACTIONATED)
HEPARIN UNFRACTIONATED: 0.21 [IU]/mL — AB (ref 0.30–0.70)
HEPARIN UNFRACTIONATED: 0.16 [IU]/mL — AB (ref 0.30–0.70)
HEPARIN UNFRACTIONATED: 0.38 [IU]/mL (ref 0.30–0.70)

## 2014-10-27 LAB — TROPONIN I
TROPONIN I: 3.44 ng/mL — AB (ref <0.031)
TROPONIN I: 4.9 ng/mL — AB (ref <0.031)
Troponin I: 3.41 ng/mL (ref <0.031)
Troponin I: 5.19 ng/mL (ref <0.031)

## 2014-10-27 MED ORDER — METOPROLOL TARTRATE 1 MG/ML IV SOLN
5.0000 mg | Freq: Once | INTRAVENOUS | Status: AC
  Administered 2014-10-27: 5 mg via INTRAVENOUS

## 2014-10-27 MED ORDER — DILTIAZEM HCL 100 MG IV SOLR
5.0000 mg/h | INTRAVENOUS | Status: DC
  Administered 2014-10-27: 7.5 mg/h via INTRAVENOUS
  Administered 2014-10-27 (×2): 5 mg/h via INTRAVENOUS
  Filled 2014-10-27: qty 100

## 2014-10-27 MED ORDER — PERFLUTREN LIPID MICROSPHERE
1.0000 mL | INTRAVENOUS | Status: AC | PRN
  Administered 2014-10-27: 2 mL via INTRAVENOUS
  Filled 2014-10-27: qty 10

## 2014-10-27 MED ORDER — HEPARIN BOLUS VIA INFUSION
2000.0000 [IU] | Freq: Once | INTRAVENOUS | Status: AC
  Administered 2014-10-27: 2000 [IU] via INTRAVENOUS
  Filled 2014-10-27: qty 2000

## 2014-10-27 MED ORDER — METOPROLOL TARTRATE 1 MG/ML IV SOLN
INTRAVENOUS | Status: AC
  Filled 2014-10-27: qty 5

## 2014-10-27 MED ORDER — DILTIAZEM LOAD VIA INFUSION
10.0000 mg | Freq: Once | INTRAVENOUS | Status: AC
  Filled 2014-10-27: qty 10

## 2014-10-27 MED ORDER — METOPROLOL TARTRATE 1 MG/ML IV SOLN
5.0000 mg | Freq: Four times a day (QID) | INTRAVENOUS | Status: DC | PRN
  Administered 2014-10-27 – 2014-10-29 (×2): 5 mg via INTRAVENOUS
  Filled 2014-10-27: qty 5

## 2014-10-27 MED ORDER — HEPARIN (PORCINE) IN NACL 100-0.45 UNIT/ML-% IJ SOLN
1900.0000 [IU]/h | INTRAMUSCULAR | Status: DC
  Administered 2014-10-27 – 2014-10-30 (×6): 1900 [IU]/h via INTRAVENOUS
  Filled 2014-10-27 (×6): qty 250

## 2014-10-27 NOTE — Progress Notes (Addendum)
Patient ID: [name], male   DOB: [birth year], 59 y.o.   MRN: [id number]  [facility] PROGRESS NOTE  CAI ANFINSON NTI:[id number] DOB: 11-22-1955 DOA: 10/26/2014 PCP: [name], MD   Brief narrative:    59 y.o. 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 This has been associated with malaise, poor oral intake, subjective fevers, substernal area chest pain, non radiating.   In ED, pt noted to be hypoxic with oxygen saturation in mid 80's and requiring placement on BiPAP, initial temp (oral) 99.4 F but rectal temp 100.2 F. RR in 30's, BP 119/51. Blood work notable for WBC 14 K, K 5.5, Cr 1.37, troponin 0.12. Marland Kitchen ECG demonstrated NSR, RBBB, RAD, ST depression in leads V2-V6. Compared to 04/23/13, the anterolateral STD are new. CXR concerning for pulmonary vascular congestion and cardiology consulted. TR[facility] asked to admit to SDU for further evaluation.   Major events since admission:  3/20 - admit to SDU, concern ACS, d CHF, sepsis of unclear source   3/21 - persistent a-fib with RVR, pt now DNI  Assessment/Plan:    Principal Problem:   Acute hypoxic respiratory failure - appears to be multifactorial and setting of acute diastolic CHF and NSTEMI in pt with known COPD (yrs of smoking) - appreciate cardiology and PCCM team assistance  - pt started on Lasix IV and also treating as ACS - Pradaxa on hold, continue Heparin drip for now per cardiology  - TTE done this AM, results pending  - further  recommendations based on 2 D ECHO - continue Metoprolol and atorvastatin (increased dose to 80 mg PO QD)    Sepsis - criteria for sepsis met: T 100.2 F, RR in 30's, WBC 14 K, lactic acid 2.36, multiple organ systems failure  - sepsis order set in place - continue broad spectrum ABX for now until more data back  - abd now looks better with no erythema, cellulitis LLE, UA suggestive of UTI - wound care consult pending  - follow up on urine and blood culture    Acute on chronic diastolic CHF - management with Lasix as noted above, per cardiology - daily weights and strict I's and O's - weight on admission 109.77 kg   NSTEMI - likely demand ischemia - management as noted above per cardiology    Paroxysmal atrial fibrillation, CHADS score 5 (CHF, HTN, DM, stroke) - holding Pradaxa and currently on Heparin drip per cardiology - continue Metoprolol and Cardizem   Active Problems:   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 - pt has at least stage II CKD based on record reveiw with GFR in 70's with normal Cr but presence of multiple risk factors including HTN, HLD, DM - Cr is now WNL - close monitoring of renal function while being diuresed with Lasix  - hold Metformin    LLE cellulitis with diabetic ulcer  - wound care consulted - erythema much improved  - ABX as noted above   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, neuropathies  - A1C  pending, pt on high dose insulin at home - holding Metformin for now and continue SSI and Lantus 40 U QHS   Hyperkalemia - WNL this AM   HLD - continue statin but increased the dose to 80 mg per cardiology team recommendations    Essential hypertension - reasonably stable    Ventral hernia - abd now with no erythema and non TTP - close monitoring - pt determined in the past to be poor surgical candidate    Iron deficiency anemia - no signs of active bleeding - repeat CBC in AM   Morbid obesity  - Body mass index is 47.28  DVT prophylaxis - pt  Heparin drip per ACS protocol   Code Status: Full.  Family Communication:  No family at bedside  Disposition Plan: Keep in SDU  IV access:  Peripheral IV  Procedures and diagnostic studies:    CXR 10/26/2014   Prominent vasculature and interstitium, likely mild congestive heart failure.    Medical Consultants:  Cardiology  PCCM - signed off  Other Consultants:  None  IAnti-Infectives/microbiology data:   Vancomycin 3/20 --> Aztreonam 3/20   Blood Cultures 3/20 --> Urine cultures 3/20 -->   Joseph Ramsay, MD  [facility] Pager [phone]  If 7PM-7AM, please contact night-coverage [email or web address] Password [facility] 10/27/2014, 3:32 PM   LOS: 1 day   HPI/Subjective: No events overnight.   Objective: Filed Vitals:   10/27/14 1330 10/27/14 1400 10/27/14 1455 10/27/14 1500  BP: 137/73 126/78 134/76 102/79  Pulse: 119 94 84 85  Temp:      TempSrc:      Resp: _0 Height:      Weight:      SpO2: 100% 98% 96% 95%    Intake/Output Summary (Last 24 hours) at 10/27/14 1532 Last data filed at 10/27/14 1237  Gross per 24 hour  Intake 970.23 ml  Output    850 ml  Net 120.23 ml    Exam:   General:  Pt is alert, follows commands appropriately, in mild distress due to dyspnea and being on BiPAP  Cardiovascular: Regular rate and rhythm, no rubs, no gallops  Respiratory: Crackles at bases, diminished breath sounds at bases   Abdomen: large ventral hernia, ant abrasion, no more erythema  Extremities: trace bilateral LE edema, right mid foot amputation, left LE with erythema on ant shin, small diabetic foot ulcer   Data Reviewed: Basic Metabolic Panel:  Recent Labs Lab 10/26/14 0329 10/26/14 1033 10/27/14 0357  NA 138 139 141  K 5.5* 5.1 4.4  CL 103 100 104  CO2 _1 GLUCOSE 240* 195* 185*  BUN 26* 26* 29*  CREATININE 1.37* 1.31 1.17  CALCIUM 8.7 8.9 8.6   Liver Function Tests:  Recent Labs Lab 10/26/14 0329  AST 21  ALT 17  ALKPHOS 70   BILITOT 0.5  PROT 6.4  ALBUMIN 3.0*    Recent Labs Lab 10/26/14 0329  LIPASE 17   CBC:  Recent Labs Lab 10/26/14 0329 10/26/14 1033 10/27/14 0357  WBC 14.0* 12.6* 12.5*  NEUTROABS 10.8* 9.5*  --   HGB 10.3* 10.6* 10.3*  HCT 33.3* 35.3* 34.4*  MCV 82.4 83.6 82.1  PLT 283 271 278   Cardiac Enzymes:  Recent Labs Lab 10/26/14 0329 10/26/14 1610 10/27/14 0357 10/27/14 2350  TROPONINI 0.12* 5.92* 3.44* 4.90*   CBG:  Recent Labs Lab 10/26/14 2029 10/26/14 2328 10/27/14 0403 10/27/14 0824 10/27/14 1229  GLUCAP 204* 197* 192* 178* 167*  Scheduled Meds: . aspirin  300 mg Rectal Daily  . atorvastatin  80 mg Oral q1800  . aztreonam  2 g Intravenous 3 times per day  . ferrous gluconate  325 mg Oral BID  . furosemide  80 mg Intravenous Q12H  . gabapentin  300 mg Oral BID  . insulin aspart  0-9 Units Subcutaneous 6 times per day  . insulin glargine  40 Units Subcutaneous QHS  . metoprolol tartrate  25 mg Oral BID  . pantoprazole  40 mg Oral Daily  . PARoxetine  40 mg Oral BH-q7a  . sodium chloride  10-40 mL Intracatheter Q12H  . sodium chloride  3 mL Intravenous Q12H  . vancomycin  750 mg Intravenous Q12H   Continuous Infusions: . diltiazem (CARDIZEM) infusion 7.5 mg/hr (10/27/14 1216)  . heparin 1,900 Units/hr (10/27/14 1418)

## 2014-10-27 NOTE — Progress Notes (Signed)
Echocardiogram 2D Echocardiogram with Definity has been performed.  [name] 10/27/2014, 11:50 AM

## 2014-10-27 NOTE — Consult Note (Signed)
PULMONARY / CRITICAL CARE MEDICINE   Name: DENYS SALINGER MRN: [id number] DOB: 04/30/1956    ADMISSION DATE:  10/26/2014 CONSULTATION DATE:  10/26/14  REFERRING MD :  Dr Doyle Askew  Reason for Consult:  Acute on chronic respiratory failure  INITIAL PRESENTATION: 59 yo man, hx HTN, A Fib, DM, large ventral hernia, chronic LLE wound and chronic diastolic CHF. He was brought to ED w 2 days SOB, found to have B infiltrates consistent with CHF. Also with ECG changes suggestive of NSTEMI.  Lasix was ordered but never given in ED. He moved to SDU and was placed on biPAP which caused significant agitation. He subsequently was given the lasix, some haldol and ativan. Since then he has tolerated the BiPAP a bit better.   SUBJECTIVE:  Remains on BiPAP.  Feels breathing better.  Denies chest pain.  VITAL SIGNS: Temp:  [97.5 F (36.4 C)-100 F (37.8 C)] 98.3 F (36.8 C) (03/21 0700) Pulse Rate:  [4-150] 140 (03/21 1130) Resp:  [15-42] 22 (03/21 1130) BP: (105-159)/(56-98) 133/94 mmHg (03/21 1130) SpO2:  [90 %-100 %] 100 % (03/21 1130) FiO2 (%):  [100 %] 100 % (03/21 0920) Weight:  [235 lb 7.2 oz (106.8 kg)] 235 lb 7.2 oz (106.8 kg) (03/21 0404) INTAKE / OUTPUT:  Intake/Output Summary (Last 24 hours) at 10/27/14 1217 Last data filed at 10/27/14 0954  Gross per 24 hour  Intake 970.23 ml  Output    450 ml  Net 520.23 ml    PHYSICAL EXAMINATION: General: pleasant Neuro: follows commands HEENT:  BiPAP mask on Cardiovascular: tachycardic Lungs: b/l crackles Abdomen:  Ventral hernia, non tender Musculoskeletal:  R mid foot amputation Skin:  L LE is wrapped in wound pressure dressing.   LABS:  CBC  Recent Labs Lab 10/26/14 0329 10/26/14 1033 10/27/14 0357  WBC 14.0* 12.6* 12.5*  HGB 10.3* 10.6* 10.3*  HCT 33.3* 35.3* 34.4*  PLT 283 271 278   Coag's  Recent Labs Lab 10/26/14 1033  APTT 54*  INR 1.36   BMET  Recent Labs Lab 10/26/14 0329 10/26/14 1033 10/27/14 0357   NA 138 139 141  K 5.5* 5.1 4.4  CL 103 100 104  CO2 28 29 29   BUN 26* 26* 29*  CREATININE 1.37* 1.31 1.17  GLUCOSE 240* 195* 185*   Electrolytes  Recent Labs Lab 10/26/14 0329 10/26/14 1033 10/27/14 0357  CALCIUM 8.7 8.9 8.6   Sepsis Markers  Recent Labs Lab 10/26/14 0410 10/26/14 0636 10/26/14 1000 10/26/14 1610  LATICACIDVEN 2.36* 1.53 1.5  --   PROCALCITON  --   --   --  <0.10   ABG  Recent Labs Lab 10/26/14 1047  PHART 7.356  PCO2ART 51.7*  PO2ART 63.0*   Liver Enzymes  Recent Labs Lab 10/26/14 0329  AST 21  ALT 17  ALKPHOS 70  BILITOT 0.5  ALBUMIN 3.0*   Cardiac Enzymes  Recent Labs Lab 10/26/14 1610 10/27/14 0357 10/27/14 2350  TROPONINI 5.92* 3.44* 4.90*   Glucose  Recent Labs Lab 10/26/14 1444 10/26/14 1525 10/26/14 2029 10/26/14 2328 10/27/14 0403 10/27/14 0824  GLUCAP 154* 176* 204* 197* 192* 178*    Imaging Dg Chest Port 1 View  10/26/2014   CLINICAL DATA:  Dyspnea  EXAM: PORTABLE CHEST - 1 VIEW  COMPARISON:  11/09/2012  FINDINGS: There is moderate cardiomegaly. There is vascular and interstitial prominence, worsened from 11/09/2012. No large effusions.  IMPRESSION: Prominent vasculature and interstitium, likely mild congestive heart failure.   Electronically Signed  By: Andreas Newport M.D.   On: 10/26/2014 04:25     ASSESSMENT / PLAN:  Acute hypoxic respiratory failure 2nd to pulmonary edema and PNA. Plan: - DNI - continue oxygen and BiPAP to keep SpO2 88 to 95% - f/u CXR - continue lasix as tolerated - continue Abx per primary team  A fib with RVR. Acute on chronic diastolic CHF. NSTEMI. Plan: - per primary team and cardiology  AKI. Plan: - monitor renal fx while getting lasix  Goals of care d/w by Dr. Lamonte Sakai with family 3/20 >> DNI.  Will defer further management to primary team.  PCCM will sign off.  Chesley Mires, MD [facility] Pulmonary/Critical Care 10/27/2014, 12:24 PM Pager:  [phone] After  3pm call: [phone]

## 2014-10-27 NOTE — Progress Notes (Signed)
ANTICOAGULATION CONSULT NOTE - Follow Up Consult  Pharmacy Consult for Heparin  Indication: chest pain/ACS  Allergies  Allergen Reactions  . Penicillins Hives and Nausea And Vomiting    Patient Measurements: Height: 5' (152.4 cm) (10/08/14) Weight: 235 lb 7.2 oz (106.8 kg) IBW/kg (Calculated) : 50  Heparin dosing weight 76 kg  Vital Signs: Temp: 98.3 F (36.8 C) (03/21 1130) Temp Source: Oral (03/21 1130) BP: 133/94 mmHg (03/21 1130) Pulse Rate: 140 (03/21 1130)  Labs:  Recent Labs  10/26/14 0329 10/26/14 1033  10/26/14 1610 10/26/14 1945 10/27/14 0357 10/27/14 1232 10/27/14 2350  HGB 10.3* 10.6*  --   --   --  10.3*  --   --   HCT 33.3* 35.3*  --   --   --  34.4*  --   --   PLT 283 271  --   --   --  278  --   --   APTT  --  54*  --   --   --   --   --   --   LABPROT  --  17.0*  --   --   --   --   --   --   INR  --  1.36  --   --   --   --   --   --   HEPARINUNFRC  --   --   < > <0.10* <0.10* 0.21* 0.16*  --   CREATININE 1.37* 1.31  --   --   --  1.17  --   --   TROPONINI 0.12*  --   --  5.92*  --  3.44*  --  4.90*  < > = values in this interval not displayed.  Estimated Creatinine Clearance: 69 mL/min (by C-G formula based on Cr of 1.17).  Assessment: 59 year old male with history of atrial fibrillation on Pradaxa prior to admission now admitted with ACS on IV heparin.   Heparin level this PM is sub-therapeutic at 0.16 on 1650 units/hr. Decreased from prior despite dose increase.  Hgb 10.3 - low stable. Platelets are within normal limits.   Per RN Jacqlyn Larsen)- no bleeding issues, running through PICC line. Does note large chronic ventricular hernia - but stable per RN. She did a thorough skin exam and found no changes.   Goal of Therapy:  Heparin level 0.3-0.7 units/ml Monitor platelets by anticoagulation protocol: Yes   Plan:  -Heparin bolus 2000 units/hr.  -Increase heparin to 1900 units/hr -repeat heparin level in 6 hours.  -Daily CBC/HL -Monitor for  bleeding  Sloan Leiter, PharmD, BCPS Clinical Pharmacist [phone] 10/27/2014,1:41 PM

## 2014-10-27 NOTE — Progress Notes (Signed)
Pt. seen after hearing BiPAP alarming, pulling @ mask, wanting mask off, removed, placed on NRB mask from bedside @15  lpm/100%, HR 148, c/o dry mouth, Dr. Doyle Askew @ bedside, RN @ desk ok'd drink of H20, placed to 3 lpm humidified n/c, noted moist/coarse breath sounds., H&P looked over, RT to monitor, called back after 5 minutes for >'d WOB, HR with some audible Rhonchi, refused BiPAP @ this time, pts. RN and another RN @ bedside, RT in unit @ PICU rounds.

## 2014-10-27 NOTE — Progress Notes (Signed)
Pt HR 130-150s Afib. 12 Lead EKG completed demonstrated Afib RVR. MD Doyle Askew aware. Given one time dose IV metoprolol. Awaiting for cardiology consult.

## 2014-10-27 NOTE — Progress Notes (Signed)
Utilization Review Completed.Donne Anon T3/21/2016

## 2014-10-27 NOTE — Progress Notes (Addendum)
CRITICAL VALUE ALERT  Critical value received:  Troponin 5.19  Date of notification:  10/27/2014  Time of notification:  Was not notified by lab, RN read result at 2218  Critical value read back:No.  Nurse who received alert:  Toney Sang  MD notified (1st page):  Lamar Blinks, NP  Time of first page:  2218  MD notified (2nd page):  Time of second page:  Responding MD:  Lamar Blinks, NP   Time MD responded:  2227, asked RN to report critical lab value to cardiology. Will notify.

## 2014-10-27 NOTE — Progress Notes (Signed)
ANTICOAGULATION CONSULT NOTE - Follow Up Consult  Pharmacy Consult for Heparin  Indication: chest pain/ACS  Allergies  Allergen Reactions  . Penicillins Hives and Nausea And Vomiting    Patient Measurements: Height: 5' (152.4 cm) (10/08/14) Weight: 235 lb 7.2 oz (106.8 kg) IBW/kg (Calculated) : 50  Heparin dosing weight 76 kg  Vital Signs: Temp: 98.1 F (36.7 C) (03/21 1917) Temp Source: Oral (03/21 1917) BP: 96/66 mmHg (03/21 2000) Pulse Rate: 82 (03/21 2000)  Labs:  Recent Labs  10/26/14 0329 10/26/14 1033  10/27/14 0357 10/27/14 1232 10/27/14 1445 10/27/14 1957 10/27/14 2350  HGB 10.3* 10.6*  --  10.3*  --   --   --   --   HCT 33.3* 35.3*  --  34.4*  --   --   --   --   PLT 283 271  --  278  --   --   --   --   APTT  --  54*  --   --   --   --   --   --   LABPROT  --  17.0*  --   --   --   --   --   --   INR  --  1.36  --   --   --   --   --   --   HEPARINUNFRC  --   --   < > 0.21* 0.16*  --  0.38  --   CREATININE 1.37* 1.31  --  1.17  --   --   --   --   TROPONINI 0.12*  --   < > 3.44*  --  3.41*  --  4.90*  < > = values in this interval not displayed.  Estimated Creatinine Clearance: 69 mL/min (by C-G formula based on Cr of 1.17).   Marland Kitchen diltiazem (CARDIZEM) infusion 5 mg/hr (10/27/14 2000)  . heparin 1,900 Units/hr (10/27/14 2000)    Assessment: 59 year old male with history of atrial fibrillation on Pradaxa prior to admission now admitted with ACS on IV heparin.   Heparin level this PM is sub-therapeutic at 0.16 on 1650 units/hr. Decreased from prior despite dose increase.  Hgb 10.3 - low stable. Platelets are within normal limits.   Per RN Jacqlyn Larsen)- no bleeding issues, running through PICC line. Does note large chronic ventricular hernia - but stable per RN. She did a thorough skin exam and found no changes.   Repeat heparin level therapeutic on heparin at 1900 units/hr.  Goal of Therapy:  Heparin level 0.3-0.7 units/ml Monitor platelets by  anticoagulation protocol: Yes   Plan:  -Continue IV heparin at current rate. -Daily CBC/HL -Monitor for bleeding  Uvaldo Rising, BCPS  Clinical Pharmacist Pager [phone]  10/27/2014 8:47 PM

## 2014-10-27 NOTE — Progress Notes (Signed)
Patient on BIPAP at this time. Tolerating well.

## 2014-10-27 NOTE — Progress Notes (Addendum)
Patient Name: [name] Date of Encounter: 10/27/2014     Principal Problem:   Acute diastolic CHF (congestive heart failure) Active Problems:   DM (diabetes mellitus), type 2, uncontrolled   HYPERCHOLESTEROLEMIA   Essential hypertension   Atrial fibrillation   Ventral hernia   Hyperkalemia   Iron deficiency anemia   Abrasion of abdominal wall   Abdominal hernia   Elevated troponin   Acute respiratory failure with hypoxia   Acute on chronic respiratory failure    SUBJECTIVE  Patient on BIPAP. Not very alert. He reports no CP.   CURRENT MEDS . aspirin  300 mg Rectal Daily  . atorvastatin  80 mg Oral q1800  . aztreonam  2 g Intravenous 3 times per day  . ferrous gluconate  325 mg Oral BID  . furosemide  80 mg Intravenous Q12H  . gabapentin  300 mg Oral BID  . insulin aspart  0-9 Units Subcutaneous 6 times per day  . insulin glargine  40 Units Subcutaneous QHS  . metoprolol tartrate  25 mg Oral BID  . pantoprazole  40 mg Oral Daily  . PARoxetine  40 mg Oral BH-q7a  . sodium chloride  10-40 mL Intracatheter Q12H  . sodium chloride  3 mL Intravenous Q12H  . vancomycin  750 mg Intravenous Q12H    OBJECTIVE  Filed Vitals:   10/27/14 1042 10/27/14 1120 10/27/14 1130 10/27/14 1330  BP:  159/92 133/94 137/73  Pulse: 146 136 140 119  Temp:   98.3 F (36.8 C)   TempSrc:   Oral   Resp: 28 32 22 25  Height:      Weight:      SpO2: 99% 100% 100% 100%    Intake/Output Summary (Last 24 hours) at 10/27/14 1351 Last data filed at 10/27/14 1237  Gross per 24 hour  Intake 970.23 ml  Output    850 ml  Net 120.23 ml   Filed Weights   10/26/14 1100 10/27/14 0404  Weight: 242 lb 1 oz (109.8 kg) 235 lb 7.2 oz (106.8 kg)    PHYSICAL EXAM  General: sedated, sickly appearing. obese Neuro: follows commands HEENT: BiPAP mask on Cardiovascular: tachycardic, irregular  Lungs: b/l crackles Abdomen: Ventral hernia, non tender Musculoskeletal: R mid foot  amputation. Cool extremities Skin: L LE is wrapped in wound pressure dressing.   Accessory Clinical Findings  CBC  Recent Labs  10/26/14 0329 10/26/14 1033 10/27/14 0357  WBC 14.0* 12.6* 12.5*  NEUTROABS 10.8* 9.5*  --   HGB 10.3* 10.6* 10.3*  HCT 33.3* 35.3* 34.4*  MCV 82.4 83.6 82.1  PLT 283 271 751   Basic Metabolic Panel  Recent Labs  10/26/14 1033 10/27/14 0357  NA 139 141  K 5.1 4.4  CL 100 104  CO2 29 29  GLUCOSE 195* 185*  BUN 26* 29*  CREATININE 1.31 1.17  CALCIUM 8.9 8.6   Liver Function Tests  Recent Labs  10/26/14 0329  AST 21  ALT 17  ALKPHOS 70  BILITOT 0.5  PROT 6.4  ALBUMIN 3.0*    Recent Labs  10/26/14 0329  LIPASE 17   Cardiac Enzymes  Recent Labs  10/26/14 1610 10/27/14 0357 10/27/14 2350  TROPONINI 5.92* 3.44* 4.90*    TELE afib with RVR. HR currently 120-130s  Radiology/Studies  Dg Chest Port 1 View  10/26/2014   CLINICAL DATA:  Dyspnea  EXAM: PORTABLE CHEST - 1 VIEW  COMPARISON:  11/09/2012  FINDINGS: There is moderate cardiomegaly. There  is vascular and interstitial prominence, worsened from 11/09/2012. No large effusions.  IMPRESSION: Prominent vasculature and interstitium, likely mild congestive heart failure.   Electronically Signed   By: Andreas Newport M.D.   On: 10/26/2014 04:25    ASSESSMENT AND PLAN  52M former smoker with mental retardation, HTN, DM c/b chronic LE wounds, HLD, AF, prior stroke, mild carotid artery disease, large ventral hernia, and COPD who presented to [facility] on 10/26/14 with chest pain and SOB. He was brought to ED w 2 days SOB, found to have B infiltrates consistent with CHF. Also with ECG changes suggestive of NSTEMI. Lasix was ordered but never given in ED. He moved to SDU and was placed on biPAP which caused significant agitation. He subsequently was given the lasix, some haldol and ativan. Since then he has tolerated the BiPAP a bit better.   Acute on chronic diastolic CHF-  he had a TTE  in March 2014 demonstrating normal EF.  -- Continue lasix 80mg  IV BID -- Monitor I/Os -- Wean BiPAP as tolerated. Not able to tolerate weaning now.  -- TTE pending  NSTEMI. He has ECG changes, positive troponin, and chest pain. Will treat as if ACS although this may be demand. Based on Tn trend and TTE will determine whether cath is warranted.  -- Continue heparin gtt, ASA, statin and BB  -- Troponin initially trending downwards 5.92--> 3.44 but now slightly elevated to 4.9. Will continue to cycle. Heparin per pharmacy. apparently they are having a difficult time getting heparin theraptuic  A fib with RVR- he presented in NSR but went into afib with RVR last night. Started on dilt gtt at 7.5 this AM. Will up tittrate to 10 as his rates are not controlled.  -- Holding pradaxa and started on UFH without a bolus for possible ACS. Now given a bolus for subtherputic heparin levels -- continue dilt gtt and metoprolol  Dispo- Goals of care d/w by Dr. Lamonte Sakai with family 3/20 >> DNI.  Judy Pimple PA-C  Pager [phone]

## 2014-10-27 NOTE — Consult Note (Signed)
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  He has TCC normally, however it has been removed at some point during this inpatient stay and the foam offloading padding is still intact with topical dressing in the wound.  He reports he is followed by the [facility] wound care center. He reports wound on his foot present for several months.  He has a chronic non healing wound on his abdomen s/p surgical intervention but unclear when.  Wound type: Left foot: neuropathic foot ulcer Abdomen: non healing surgical wound Pressure Ulcer POA: No Measurement: Left foot: 2.0cm x 2cm x 0.3cm; Abdomen: 2.5cm x 1.5cm x 0.1cm  Wound bed: left foot wound is pale, non granular with hyperkeratotic ridge. Full thickness ;  Abdominal wound is clean, pink, moist but not granular appears chronic in nature, partial thickness  Drainage (amount, consistency, odor) minimal at each site, serosanguinous  Periwound: intact with scarring over the abdomen Dressing procedure/placement/frequency: Silver hydrofiber for recalcitrant abdominal wound and foot wound. To absorb exudate and treat bioburdan.  Change every 3 days. Pt to follow up with wound care center [facility] [place]) at the time of DC.   Discussed POC with patient and bedside nurse.  Re consult if needed, will not follow at this time. Thanks  Joden Bonsall [place], [place] [phone])

## 2014-10-27 NOTE — Progress Notes (Signed)
ANTICOAGULATION CONSULT NOTE - Follow Up Consult  Pharmacy Consult for Heparin  Indication: chest pain/ACS  Allergies  Allergen Reactions  . Penicillins Hives and Nausea And Vomiting    Patient Measurements: Height: 5' (152.4 cm) (10/08/14) Weight: 235 lb 7.2 oz (106.8 kg) IBW/kg (Calculated) : 50  Vital Signs: Temp: 97.5 F (36.4 C) (03/21 0350) Temp Source: Axillary (03/21 0350) BP: 132/78 mmHg (03/21 0350) Pulse Rate: 131 (03/21 0350)  Labs:  Recent Labs  10/26/14 0329 10/26/14 1033 10/26/14 1610 10/26/14 1945 10/27/14 0357 10/27/14 2350  HGB 10.3* 10.6*  --   --  10.3*  --   HCT 33.3* 35.3*  --   --  34.4*  --   PLT 283 271  --   --  278  --   APTT  --  54*  --   --   --   --   LABPROT  --  17.0*  --   --   --   --   INR  --  1.36  --   --   --   --   HEPARINUNFRC  --   --  <0.10* <0.10* 0.21*  --   CREATININE 1.37* 1.31  --   --  1.17  --   TROPONINI 0.12*  --  5.92*  --   --  4.90*    Estimated Creatinine Clearance: 69 mL/min (by C-G formula based on Cr of 1.17).  Assessment: Sub-therapeutic heparin level, Hgb stable, troponin is rising.   Goal of Therapy:  Heparin level 0.3-0.7 units/ml Monitor platelets by anticoagulation protocol: Yes   Plan:  -Increase heparin to 1650 units/hr -1200 HL -Daily CBC/HL -Monitor for bleeding  Narda Bonds 10/27/2014,5:05 AM

## 2014-10-28 LAB — GLUCOSE, CAPILLARY
GLUCOSE-CAPILLARY: 153 mg/dL — AB (ref 70–99)
GLUCOSE-CAPILLARY: 251 mg/dL — AB (ref 70–99)
Glucose-Capillary: 139 mg/dL — ABNORMAL HIGH (ref 70–99)
Glucose-Capillary: 118 mg/dL — ABNORMAL HIGH (ref 70–99)
Glucose-Capillary: 151 mg/dL — ABNORMAL HIGH (ref 70–99)
Glucose-Capillary: 136 mg/dL — ABNORMAL HIGH (ref 70–99)
Glucose-Capillary: 249 mg/dL — ABNORMAL HIGH (ref 70–99)

## 2014-10-28 LAB — CBC
HEMATOCRIT: 28.4 % — AB (ref 39.0–52.0)
Hemoglobin: 8.7 g/dL — ABNORMAL LOW (ref 13.0–17.0)
MCH: 25.1 pg — ABNORMAL LOW (ref 26.0–34.0)
MCHC: 30.6 g/dL (ref 30.0–36.0)
MCV: 81.8 fL (ref 78.0–100.0)
Platelets: 257 10*3/uL (ref 150–400)
RBC: 3.47 MIL/uL — AB (ref 4.22–5.81)
RDW: 15.6 % — ABNORMAL HIGH (ref 11.5–15.5)
WBC: 10.1 10*3/uL (ref 4.0–10.5)

## 2014-10-28 LAB — BASIC METABOLIC PANEL
Anion gap: 9 (ref 5–15)
BUN: 39 mg/dL — AB (ref 6–23)
CALCIUM: 8.4 mg/dL (ref 8.4–10.5)
CO2: 30 mmol/L (ref 19–32)
Chloride: 98 mmol/L (ref 96–112)
Creatinine, Ser: 1.31 mg/dL (ref 0.50–1.35)
GFR calc non Af Amer: 58 mL/min — ABNORMAL LOW (ref >90)
GFR, EST AFRICAN AMERICAN: 67 mL/min — AB (ref >90)
Glucose, Bld: 147 mg/dL — ABNORMAL HIGH (ref 70–99)
POTASSIUM: 4.3 mmol/L (ref 3.5–5.1)
Sodium: 137 mmol/L (ref 135–145)

## 2014-10-28 LAB — TROPONIN I: TROPONIN I: 6.47 ng/mL — AB (ref <0.031)

## 2014-10-28 LAB — HEPARIN LEVEL (UNFRACTIONATED): Heparin Unfractionated: 0.46 IU/mL (ref 0.30–0.70)

## 2014-10-28 LAB — HEMOGLOBIN A1C
Hgb A1c MFr Bld: 7.9 % — ABNORMAL HIGH (ref 4.8–5.6)
MEAN PLASMA GLUCOSE: 180 mg/dL

## 2014-10-28 MED ORDER — METHYLPREDNISOLONE SODIUM SUCC 40 MG IJ SOLR
40.0000 mg | Freq: Three times a day (TID) | INTRAMUSCULAR | Status: DC
  Administered 2014-10-28 – 2014-10-30 (×7): 40 mg via INTRAVENOUS
  Filled 2014-10-28 (×9): qty 1

## 2014-10-28 MED ORDER — HYDROCODONE-ACETAMINOPHEN 5-325 MG PO TABS
1.0000 | ORAL_TABLET | Freq: Four times a day (QID) | ORAL | Status: DC | PRN
  Administered 2014-10-28 – 2014-11-03 (×13): 1 via ORAL
  Filled 2014-10-28 (×14): qty 1

## 2014-10-28 MED ORDER — CETYLPYRIDINIUM CHLORIDE 0.05 % MT LIQD
7.0000 mL | Freq: Two times a day (BID) | OROMUCOSAL | Status: DC
  Administered 2014-10-28 – 2014-11-03 (×11): 7 mL via OROMUCOSAL

## 2014-10-28 MED ORDER — LEVALBUTEROL HCL 1.25 MG/0.5ML IN NEBU
1.2500 mg | INHALATION_SOLUTION | Freq: Four times a day (QID) | RESPIRATORY_TRACT | Status: DC
  Administered 2014-10-28 (×2): 1.25 mg via RESPIRATORY_TRACT
  Filled 2014-10-28 (×3): qty 0.5

## 2014-10-28 NOTE — Progress Notes (Signed)
Patient Name: [name] Date of Encounter: 10/28/2014     Principal Problem:   Acute diastolic CHF (congestive heart failure) Active Problems:   DM (diabetes mellitus), type 2, uncontrolled   HYPERCHOLESTEROLEMIA   Essential hypertension   Atrial fibrillation   Ventral hernia   Hyperkalemia   Iron deficiency anemia   Abrasion of abdominal wall   Abdominal hernia   Elevated troponin   Acute respiratory failure with hypoxia   Acute on chronic respiratory failure    SUBJECTIVE  Patient on [place] oxygen. Doesn't feel well. Still SOB. Complains of abdominal pain with coughing.   CURRENT MEDS . aspirin  300 mg Rectal Daily  . atorvastatin  80 mg Oral q1800  . aztreonam  2 g Intravenous 3 times per day  . ferrous gluconate  325 mg Oral BID  . gabapentin  300 mg Oral BID  . insulin aspart  0-9 Units Subcutaneous 6 times per day  . insulin glargine  40 Units Subcutaneous QHS  . metoprolol tartrate  25 mg Oral BID  . pantoprazole  40 mg Oral Daily  . PARoxetine  40 mg Oral BH-q7a  . sodium chloride  10-40 mL Intracatheter Q12H  . sodium chloride  3 mL Intravenous Q12H  . vancomycin  750 mg Intravenous Q12H    OBJECTIVE  Filed Vitals:   10/28/14 0423 10/28/14 0813 10/28/14 0900 10/28/14 1040  BP:  153/100  154/95  Pulse: 73 73  81  Temp:   98.2 F (36.8 C)   TempSrc:   Oral   Resp: 19 24    Height:      Weight:      SpO2: 97% 100%      Intake/Output Summary (Last 24 hours) at 10/28/14 1132 Last data filed at 10/28/14 1043  Gross per 24 hour  Intake 1507.42 ml  Output    975 ml  Net 532.42 ml   Filed Weights   10/26/14 1100 10/27/14 0404 10/28/14 0319  Weight: 242 lb 1 oz (109.8 kg) 235 lb 7.2 oz (106.8 kg) 240 lb 15.4 oz (109.3 kg)    PHYSICAL EXAM  General: obese, sickly appearing.  Neuro: follows commands HEENT: Normal. Cardiovascular: RRR, normal S1-2. No murmur or gallop  Lungs: mild respiratory distress. Bilateral coarse wheezes and  rales. Abdomen: Ventral hernia, non tender Musculoskeletal: R mid foot amputation. Cool extremities Skin: L LE is wrapped in wound pressure dressing.   Accessory Clinical Findings  CBC  Recent Labs  10/26/14 0329 10/26/14 1033 10/27/14 0357 10/28/14 0228  WBC 14.0* 12.6* 12.5* 10.1  NEUTROABS 10.8* 9.5*  --   --   HGB 10.3* 10.6* 10.3* 8.7*  HCT 33.3* 35.3* 34.4* 28.4*  MCV 82.4 83.6 82.1 81.8  PLT 283 271 278 378   Basic Metabolic Panel  Recent Labs  10/27/14 0357 10/28/14 0228  NA 141 137  K 4.4 4.3  CL 104 98  CO2 29 30  GLUCOSE 185* 147*  BUN 29* 39*  CREATININE 1.17 1.31  CALCIUM 8.6 8.4   Liver Function Tests  Recent Labs  10/26/14 0329  AST 21  ALT 17  ALKPHOS 70  BILITOT 0.5  PROT 6.4  ALBUMIN 3.0*    Recent Labs  10/26/14 0329  LIPASE 17   Cardiac Enzymes  Recent Labs  10/27/14 1957 10/27/14 2350 10/28/14 0228  TROPONINI 5.19* 4.90* 6.47*    TELE NSR with rare PVC  Radiology/Studies  Dg Chest Port 1 View  10/26/2014  CLINICAL DATA:  Dyspnea  EXAM: PORTABLE CHEST - 1 VIEW  COMPARISON:  11/09/2012  FINDINGS: There is moderate cardiomegaly. There is vascular and interstitial prominence, worsened from 11/09/2012. No large effusions.  IMPRESSION: Prominent vasculature and interstitium, likely mild congestive heart failure.   Electronically Signed   By: Andreas Newport M.D.   On: 10/26/2014 04:25    ASSESSMENT AND PLAN  69M former smoker with mental retardation, HTN, DM c/b chronic LE wounds, HLD, AF, prior stroke, mild carotid artery disease, large ventral hernia, and COPD who presented to [facility] on 10/26/14 with chest pain and SOB. He was brought to ED w 2 days SOB, found to have B infiltrates consistent with CHF. Also with ECG changes suggestive of NSTEMI. He moved to SDU and was placed on biPAP which caused significant agitation. He subsequently was given the lasix, some haldol and ativan. Since then he has been weaned to  [facility].  Acute on chronic diastolic CHF-  he had a TTE in March 2014 demonstrating normal EF. Echo here still pending.  -- Continue lasix 80mg  IV BID. I/O still positive 1 liter since admission and weight increasing.  -- Monitor I/Os -- continue oxygen support. -- TTE pending  NSTEMI. He has ECG changes, positive troponin (peak 6.47), and chest pain. Will treat as if ACS although this may be demand.  -- Continue heparin gtt, ASA, statin and BB. Will need to consider ischemic evaluation once there is significant improvement in his other medical conditions but for now he is a poor candidate for cath or stress testing due to his multiple severe medical problems.   A fib with RVR- he presented in NSR but went into afib with RVR. Now converted to NSR. On oral metoprolol. DC IV  diltiazem. Depending results of Echo may elect to resume Pradaxa if no further invasive procedures planned.  Dispo- Goals of care d/w by Dr. Lamonte Sakai with family 3/20 >> DNI.  Signed, Rotha Cassels [place] MD,FACC 10/28/2014

## 2014-10-28 NOTE — Progress Notes (Signed)
ANTICOAGULATIONand ANTIBIOTIC CONSULT NOTE - Follow Up Consult  Pharmacy Consult for Heparin, Vancomycin and Aztreonam Indication: chest pain/ACS and atrial fibrillation, sepsis and cellulitis coverage  Allergies  Allergen Reactions  . Penicillins Hives and Nausea And Vomiting    Patient Measurements: Height: 5' (152.4 cm) (10/08/14) Weight: 240 lb 15.4 oz (109.3 kg) IBW/kg (Calculated) : 50 Heparin Dosing Weight: 72 kg  Vital Signs: Temp: 98.2 F (36.8 C) (03/22 0900) Temp Source: Oral (03/22 0900) BP: 154/95 mmHg (03/22 1040) Pulse Rate: 81 (03/22 1040)  Labs:  Recent Labs  10/26/14 1033  10/27/14 0357 10/27/14 1232  10/27/14 1957 10/27/14 2350 10/28/14 0228  HGB 10.6*  --  10.3*  --   --   --   --  8.7*  HCT 35.3*  --  34.4*  --   --   --   --  28.4*  PLT 271  --  278  --   --   --   --  257  APTT 54*  --   --   --   --   --   --   --   LABPROT 17.0*  --   --   --   --   --   --   --   INR 1.36  --   --   --   --   --   --   --   HEPARINUNFRC  --   < > 0.21* 0.16*  --  0.38  --  0.46  CREATININE 1.31  --  1.17  --   --   --   --  1.31  TROPONINI  --   < > 3.44*  --   < > 5.19* 4.90* 6.47*  < > = values in this interval not displayed.  Estimated Creatinine Clearance: 62.5 mL/min (by C-G formula based on Cr of 1.31).  Assessment:  Heparin level is therapeutic (0.46) on 1900 units/hr. Hgb down from 10.3 to 8.7, no bleeding reported. Platelet count ok.  Home Pradaxa on hold. On ASA 300 mg PR daily.  Echo pending.     Day # 3 Vancomycin and Aztreonam. Tmax 99.3, WBC down to 10.1.  Blood cultures negative to date; urine culture with insignificant growth. Erythema noted improved. BUN/creatinine have trended up some today, but antibiotic doses still ok.  Goal of Therapy:  Heparin level 0.3-0.7 units/ml Monitor platelets by anticoagulation protocol: Yes  Vancomycin trough levels 15-20 mcg/ml Appropriate Aztreonam dose for renal function and infection   Plan:   Continue heparin drip at 1900 units/hr.  Daily heparin level and CBC.  Follow up for resuming Pradaxa when able, if no procedures planned.   Continue Aztreonam 2grams IV q8hrs.  Continue Vancomycin 750 mg IV q12hrs.  Follow renal function, culture data, progress.  Will plan to check Vanc trough level later this week if Vanc to continue.   Arty Baumgartner, [place] Pager: [phone] 10/28/2014,10:49 AM

## 2014-10-28 NOTE — Progress Notes (Signed)
Troponin 6.47. Previous elevated troponin called to cardiologist on call, Dr. Tommi Rumps. No new order received at that time. Vital signs stable. Currently on heparin drip. No complaints of chest pain. Will continue to monitor.   Lum Babe, RN

## 2014-10-28 NOTE — Progress Notes (Signed)
Patient ID: [name], male   DOB: 1956-05-15, 59 y.o.   MRN: [id number]  [facility] PROGRESS NOTE  [name] GMW:[id number] DOB: 1956-05-22 DOA: 10/26/2014 PCP: [name], MD   Brief narrative:    59 y.o. 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 This has been associated with malaise, poor oral intake, subjective fevers, substernal area chest pain, non radiating.   In ED, pt noted to be hypoxic with oxygen saturation in mid 80's and requiring placement on BiPAP, initial temp (oral) 99.4 F but rectal temp 100.2 F. RR in 30's, BP 119/51. Blood work notable for WBC 14 K, K 5.5, Cr 1.37, troponin 0.12. Marland Kitchen ECG demonstrated NSR, RBBB, RAD, ST depression in leads V2-V6. Compared to 04/23/13, the anterolateral STD are new. CXR concerning for pulmonary vascular congestion and cardiology consulted. TRH asked to admit to SDU for further evaluation.   Major events since admission:  3/20 - admit to SDU, concern ACS, d CHF, sepsis of unclear source   3/21 - persistent a-fib with RVR, pt now DNI  3/22 - troponins continue trending up, added solumedrol due to wheezing   Assessment/Plan:    Principal Problem:   Acute hypoxic respiratory failure - appears to be multifactorial and setting of acute diastolic CHF and NSTEMI in pt with known COPD (yrs of smoking) - appreciate cardiology team assistance  - pt started on Lasix IV and also treating as ACS - Pradaxa on hold, continue Heparin drip for now  per cardiology  - TTE pending  - further recommendations based on 2 D ECHO - continue Metoprolol and atorvastatin (increased dose to 80 mg PO QD)    Sepsis - criteria for sepsis met: T 100.2 F, RR in 30's, WBC 14 K, lactic acid 2.36, multiple organ systems failure  - sepsis order set in place - pt still with low grade fever Tmax 99.3 F, WBC is WNL this AM - continue broad spectrum ABX for now until more data back  - abd now looks better with no erythema, cellulitis LLE, UA suggestive of UTI - follow up on urine and blood culture    Acute on chronic diastolic CHF - management with Lasix as noted above, per cardiology - daily weights and strict I's and O's - weight on admission 109 this AM   Acute on chronic COPD - more wheezing on exam this AM - add solumedrol and continue BD's scheduled and as needed    NSTEMI - likely demand ischemia - management as noted above per cardiology    Paroxysmal atrial fibrillation, CHADS score 5 (CHF, HTN, DM, stroke) - holding Pradaxa and currently on Heparin drip per cardiology - continue Metoprolol and Cardizem   Active Problems:   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 - pt has at least stage II CKD based on record reveiw with GFR in 70's with normal Cr but presence of multiple risk factors including HTN, HLD, DM - Cr is now WNL - close monitoring of renal function while being diuresed with Lasix  -  holding Metformin    LLE cellulitis with diabetic ulcer  - wound care consulted - erythema much improved  - ABX as noted above   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, neuropathies  - A1C 7.9, pt on high dose insulin at home - holding Metformin for now and continue SSI and Lantus 40 U QHS   Hyperkalemia - WNL this AM   HLD - continue statin but increased the dose to 80 mg per cardiology team recommendations    Essential hypertension - reasonably stable    Ventral hernia - abd now with no erythema and non TTP - close monitoring - pt determined  in the past to be poor surgical candidate    Iron deficiency anemia - no signs of active bleeding but Hg is down since admission  - repeat CBC in AM   Morbid obesity  - Body mass index is 47.28  DVT prophylaxis - pt Heparin drip per ACS protocol   Code Status: Full.  Family Communication:  No family at bedside  Disposition Plan: Keep in SDU  IV access:  Peripheral IV  Procedures and diagnostic studies:    CXR 10/26/2014   Prominent vasculature and interstitium, likely mild congestive heart failure.    Medical Consultants:  Cardiology  PCCM - signed off  Other Consultants:  None  IAnti-Infectives/microbiology data:   Vancomycin 3/20 --> Aztreonam 3/20   Blood Cultures 3/20 --> Urine cultures 3/20 -->   Faye Ramsay, MD  [facility] Pager [phone]  If 7PM-7AM, please contact night-coverage [email or web address] Password [facility] 10/28/2014, 4:03 PM   LOS: 2 days   HPI/Subjective: No events overnight.   Objective: Filed Vitals:   10/28/14 0900 10/28/14 1040 10/28/14 1206 10/28/14 1515  BP:  154/95  110/48  Pulse:  81  69  Temp: 98.2 F (36.8 C)  98.1 F (36.7 C) 97.2 F (36.2 C)  TempSrc: Oral  Oral Axillary  Resp:    19  Height:      Weight:      SpO2:    99%    Intake/Output Summary (Last 24 hours) at 10/28/14 1603 Last data filed at 10/28/14 1500  Gross per 24 hour  Intake 1851.25 ml  Output    875 ml  Net 976.25 ml    Exam:   General:  Pt is alert, follows commands appropriately, in mild distress due to dyspnea and being on BiPAP  Cardiovascular: Regular rate and rhythm, no rubs, no gallops  Respiratory: Crackles at bases, diminished breath sounds at bases, expiratory and inspiratory wheezing   Abdomen: large ventral hernia, ant abrasion, no more erythema  Extremities: trace bilateral LE edema, right mid foot amputation, left LE with erythema on ant shin, small diabetic foot ulcer   Data Reviewed: Basic Metabolic Panel:  Recent Labs Lab  10/26/14 0329 10/26/14 1033 10/27/14 0357 10/28/14 0228  NA 138 139 141 137  K 5.5* 5.1 4.4 4.3  CL 103 100 104 98  CO2 '28 29 29 30  ' GLUCOSE 240* 195* 185* 147*  BUN 26* 26* 29* 39*  CREATININE 1.37* 1.31 1.17 1.31  CALCIUM 8.7 8.9 8.6 8.4   Liver Function Tests:  Recent Labs Lab 10/26/14 0329  AST 21  ALT 17  ALKPHOS 70  BILITOT 0.5  PROT 6.4  ALBUMIN 3.0*    Recent Labs Lab 10/26/14 0329  LIPASE 17   CBC:  Recent Labs Lab 10/26/14 0329 10/26/14 1033 10/27/14 0357 10/28/14 0228  WBC 14.0* 12.6* 12.5* 10.1  NEUTROABS 10.8* 9.5*  --   --   HGB 10.3* 10.6* 10.3* 8.7*  HCT 33.3* 35.3* 34.4* 28.4*  MCV 82.4 83.6 82.1 81.8  PLT 283 271 278 257   Cardiac Enzymes:  Recent Labs Lab 10/27/14 0357 10/27/14 1445 10/27/14 1957 10/27/14 2350 10/28/14 0228  TROPONINI 3.44* 3.41* 5.19* 4.90* 6.47*   CBG:  Recent Labs Lab 10/27/14 2118 10/27/14 2322 10/28/14 0319 10/28/14 0813 10/28/14 1201  GLUCAP 148* 166* 139* 118* 151*    Scheduled Meds: . aspirin  300 mg Rectal Daily  . atorvastatin  80 mg Oral q1800  . aztreonam  2 g Intravenous 3 times per day  . ferrous gluconate  325 mg Oral BID  . gabapentin  300 mg Oral BID  . insulin aspart  0-9 Units Subcutaneous 6 times per day  . insulin glargine  40 Units Subcutaneous QHS  . metoprolol tartrate  25 mg Oral BID  . pantoprazole  40 mg Oral Daily  . PARoxetine  40 mg Oral BH-q7a  . sodium chloride  10-40 mL Intracatheter Q12H  . sodium chloride  3 mL Intravenous Q12H  . vancomycin  750 mg Intravenous Q12H   Continuous Infusions: . heparin 1,900 Units/hr (10/28/14 0700)

## 2014-10-28 NOTE — Progress Notes (Signed)
Patient is currently on 6lnc with sats of 97%. Vitals are stable at this time. HR 77, RR 20, BP 104/49. Patient is resting comfrtably and is in no distress. BIPAP is on stanby if needed. Will continue to monitor.

## 2014-10-29 ENCOUNTER — Inpatient Hospital Stay (HOSPITAL_COMMUNITY): Payer: Medicare (Managed Care)

## 2014-10-29 LAB — BASIC METABOLIC PANEL
Anion gap: 6 (ref 5–15)
BUN: 47 mg/dL — ABNORMAL HIGH (ref 6–23)
CO2: 29 mmol/L (ref 19–32)
Calcium: 8.4 mg/dL (ref 8.4–10.5)
Chloride: 98 mmol/L (ref 96–112)
Creatinine, Ser: 1.26 mg/dL (ref 0.50–1.35)
GFR calc Af Amer: 70 mL/min — ABNORMAL LOW (ref >90)
GFR calc non Af Amer: 60 mL/min — ABNORMAL LOW (ref >90)
Glucose, Bld: 237 mg/dL — ABNORMAL HIGH (ref 70–99)
Potassium: 4.6 mmol/L (ref 3.5–5.1)
Sodium: 133 mmol/L — ABNORMAL LOW (ref 135–145)

## 2014-10-29 LAB — HEPARIN LEVEL (UNFRACTIONATED): HEPARIN UNFRACTIONATED: 0.61 [IU]/mL (ref 0.30–0.70)

## 2014-10-29 LAB — GLUCOSE, CAPILLARY
GLUCOSE-CAPILLARY: 249 mg/dL — AB (ref 70–99)
GLUCOSE-CAPILLARY: 278 mg/dL — AB (ref 70–99)
GLUCOSE-CAPILLARY: 364 mg/dL — AB (ref 70–99)
Glucose-Capillary: 218 mg/dL — ABNORMAL HIGH (ref 70–99)
Glucose-Capillary: 232 mg/dL — ABNORMAL HIGH (ref 70–99)
Glucose-Capillary: 272 mg/dL — ABNORMAL HIGH (ref 70–99)

## 2014-10-29 LAB — CBC
HCT: 30 % — ABNORMAL LOW (ref 39.0–52.0)
Hemoglobin: 9.3 g/dL — ABNORMAL LOW (ref 13.0–17.0)
MCH: 24.9 pg — ABNORMAL LOW (ref 26.0–34.0)
MCHC: 31 g/dL (ref 30.0–36.0)
MCV: 80.4 fL (ref 78.0–100.0)
Platelets: 275 10*3/uL (ref 150–400)
RBC: 3.73 MIL/uL — ABNORMAL LOW (ref 4.22–5.81)
RDW: 15.1 % (ref 11.5–15.5)
WBC: 7.2 10*3/uL (ref 4.0–10.5)

## 2014-10-29 MED ORDER — METOPROLOL TARTRATE 25 MG PO TABS
25.0000 mg | ORAL_TABLET | Freq: Four times a day (QID) | ORAL | Status: DC
  Administered 2014-10-29 – 2014-10-30 (×5): 25 mg via ORAL
  Filled 2014-10-29 (×8): qty 1

## 2014-10-29 MED ORDER — INSULIN ASPART 100 UNIT/ML ~~LOC~~ SOLN
3.0000 [IU] | Freq: Three times a day (TID) | SUBCUTANEOUS | Status: DC
  Administered 2014-10-30: 3 [IU] via SUBCUTANEOUS

## 2014-10-29 MED ORDER — METOPROLOL TARTRATE 1 MG/ML IV SOLN
5.0000 mg | INTRAVENOUS | Status: DC | PRN
  Administered 2014-10-31 – 2014-11-01 (×2): 5 mg via INTRAVENOUS
  Filled 2014-10-29 (×2): qty 5

## 2014-10-29 MED ORDER — INSULIN ASPART 100 UNIT/ML ~~LOC~~ SOLN
0.0000 [IU] | Freq: Three times a day (TID) | SUBCUTANEOUS | Status: DC
  Administered 2014-10-30: 15 [IU] via SUBCUTANEOUS

## 2014-10-29 MED ORDER — LEVALBUTEROL HCL 1.25 MG/0.5ML IN NEBU
1.2500 mg | INHALATION_SOLUTION | Freq: Four times a day (QID) | RESPIRATORY_TRACT | Status: DC
  Filled 2014-10-29: qty 0.5

## 2014-10-29 MED ORDER — LEVALBUTEROL HCL 1.25 MG/0.5ML IN NEBU
1.2500 mg | INHALATION_SOLUTION | Freq: Three times a day (TID) | RESPIRATORY_TRACT | Status: DC
  Administered 2014-10-30 – 2014-11-01 (×6): 1.25 mg via RESPIRATORY_TRACT
  Filled 2014-10-29 (×11): qty 0.5

## 2014-10-29 MED ORDER — FERROUS GLUCONATE 324 (38 FE) MG PO TABS
324.0000 mg | ORAL_TABLET | Freq: Two times a day (BID) | ORAL | Status: DC
  Administered 2014-10-29 – 2014-11-03 (×10): 324 mg via ORAL
  Filled 2014-10-29 (×12): qty 1

## 2014-10-29 MED ORDER — FUROSEMIDE 10 MG/ML IJ SOLN
80.0000 mg | Freq: Two times a day (BID) | INTRAMUSCULAR | Status: DC
  Administered 2014-10-29 – 2014-11-03 (×11): 80 mg via INTRAVENOUS
  Filled 2014-10-29 (×16): qty 8

## 2014-10-29 MED ORDER — LEVALBUTEROL HCL 1.25 MG/0.5ML IN NEBU
1.2500 mg | INHALATION_SOLUTION | Freq: Four times a day (QID) | RESPIRATORY_TRACT | Status: DC
  Administered 2014-10-29 (×2): 1.25 mg via RESPIRATORY_TRACT
  Filled 2014-10-29 (×5): qty 0.5

## 2014-10-29 MED ORDER — CLONAZEPAM 0.5 MG PO TABS
1.0000 mg | ORAL_TABLET | Freq: Three times a day (TID) | ORAL | Status: DC | PRN
  Administered 2014-10-29 – 2014-11-03 (×6): 1 mg via ORAL
  Filled 2014-10-29 (×3): qty 2
  Filled 2014-10-29: qty 1
  Filled 2014-10-29: qty 2
  Filled 2014-10-29: qty 1

## 2014-10-29 MED ORDER — SENNOSIDES 8.8 MG/5ML PO SYRP
5.0000 mL | ORAL_SOLUTION | Freq: Two times a day (BID) | ORAL | Status: DC
  Administered 2014-10-29 – 2014-11-01 (×8): 5 mL via ORAL
  Filled 2014-10-29 (×10): qty 5

## 2014-10-29 NOTE — Progress Notes (Signed)
Inpatient Diabetes Program Recommendations  AACE/ADA: New Consensus Statement on Inpatient Glycemic Control (2013)  Target Ranges:  Prepandial:   less than 140 mg/dL      Peak postprandial:   less than 180 mg/dL (1-2 hours)      Critically ill patients:  140 - 180 mg/dL   Results for [name], [name] (MRN [id number]) as of 10/29/2014 12:49  Ref. Range 10/29/2014 00:13 10/29/2014 03:30 10/29/2014 08:09 10/29/2014 11:41  Glucose-Capillary Latest Range: 70-99 mg/dL 272 (H) 218 (H) 232 (H) 249 (H)   Current orders for Inpatient glycemic control: Lantus 40 units QHS, Novolog 0-9 units Q4hrs  Inpatient Diabetes Program Recommendations Correction (SSI): Patient started IV Solumedrol 40mg  Q8hrs. Glucose in the 200's. Please increase correction scale to Novolog 0-15 units (moderate scale) Q4hrs.   Thanks,  Tama Headings RN, MSN, [facility] Inpatient Diabetes Coordinator Team Pager [phone]

## 2014-10-29 NOTE — Progress Notes (Signed)
Called by RN re: afib, RVR  Pt had been given IV BB ordered PRN, but no immediate effect. SBP > 140  During discussion, pt HR decreased to 60s, SR, confirmed by ECG.  Plan: Increase PRN BB to metoprolol 5 mg q 2 hr for HR > 120 Increase PO BB to metoprolol 25 mg q 6 hr, if tolerates well, can change to metoprolol 50 mg bid in am.   Continue to monitor.  Rosaria Ferries, PA-C 10/29/2014 2:13 PM Beeper [phone]

## 2014-10-29 NOTE — Progress Notes (Signed)
Cardiology paged in regards to patient converting into a-fib with rate ranging from 110-150s. New orders given. EKG obtained. Patient reverted back into SR with rate to 60-70 without further interventions.

## 2014-10-29 NOTE — Progress Notes (Signed)
Patient ID: [name], male   DOB: 12-15-1955, 59 y.o.   MRN: [id number]  [facility] PROGRESS NOTE  DAMOND BORCHERS NLG:[id number] DOB: [birth year] DOA: 10/26/2014 PCP: [name], MD   Brief narrative:    59 y.o. 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 This has been associated with malaise, poor oral intake, subjective fevers, substernal area chest pain, non radiating.   In ED, pt noted to be hypoxic with oxygen saturation in mid 80's and requiring placement on BiPAP, initial temp (oral) 99.4 F but rectal temp 100.2 F. RR in 30's, BP 119/51. Blood work notable for WBC 14 K, K 5.5, Cr 1.37, troponin 0.12. Marland Kitchen ECG demonstrated NSR, RBBB, RAD, ST depression in leads V2-V6. Compared to 04/23/13, the anterolateral STD are new. CXR concerning for pulmonary vascular congestion and cardiology consulted. TR[facility] asked to admit to SDU for further evaluation.   Major events since admission:  3/20 - admit to SDU, concern ACS, d CHF, sepsis of unclear source   3/21 - persistent a-fib with RVR, pt now DNI  3/22 - troponins continue trending up, added solumedrol due to wheezing  3/22 - more chest pain this AM, pt apparently did not get any Lasix 3/22, restarted this AM 3/23  Assessment/Plan:    Principal Problem:   Acute hypoxic respiratory failure - appears to be multifactorial and setting of acute diastolic CHF and NSTEMI in pt with known COPD (yrs of smoking) - appreciate cardiology team assistance  - pt  started on Lasix IV and also treating as ACS, pt apparently did not receive any lasix 3/22, ? Was discontinued - pt with more chest pain this am, diffuse rales bilaterally with exp wheezing, will ask for CXR today  - weight is trending down since admission: 242 lbs --> 235 lbs this AM  - will resume today 80 mg BID IV, continue solumedrol for now - TTE pending  - further recommendations based on 2 D ECHO - continue Metoprolol and atorvastatin (increased dose to 80 mg PO QD)    Sepsis - criteria for sepsis met: T 100.2 F, RR in 30's, WBC 14 K, lactic acid 2.36, multiple organ systems failure  - sepsis order set in place - urine culture so far with no sign growth, blood cultures negative, LLE cellulitis better - if CXR with no signs of PNA, will plan on narrowing down ABX today, ? Levaquin PO    Acute on chronic diastolic CHF - management with Lasix as noted above, per cardiology - check with pharmacy why pt did not get Lasix 3/22 - daily weights and strict I's and O's - weight on admission: 242 lbs --> 235 lbs this AM   Acute on chronic COPD - still wheezing on exam this AM - continue solumedrol and continue BD's scheduled and as needed    NSTEMI - likely demand ischemia - management as noted above per cardiology    Paroxysmal atrial fibrillation, CHADS score 5 (CHF, HTN, DM, stroke) - holding Pradaxa and currently on Heparin drip per  cardiology - continue Metoprolol and Cardizem   Active Problems:   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 - pt has at least stage II CKD based on record reveiw with GFR in 70's with normal Cr but presence of multiple risk factors including HTN, HLD, DM - Cr is now WNL - close monitoring of renal function while being diuresed with Lasix  - holding Metformin    LLE cellulitis with diabetic ulcer  - wound care consulted - erythema much improved  - ABX as noted above   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, neuropathies  - A1C 7.9, pt on high dose  insulin at home - holding Metformin for now and continue SSI and Lantus 40 U QHS   Hyperkalemia - WNL this AM   HLD - continue statin but increased the dose to 80 mg per cardiology team recommendations    Essential hypertension - reasonably stable    Ventral hernia - abd now with no erythema and non TTP - close monitoring - pt determined in the past to be poor surgical candidate    Iron deficiency anemia - no signs of active bleeding - repeat CBC in AM   Morbid obesity  - Body mass index is 47.28  DVT prophylaxis - pt Heparin drip per ACS protocol   Code Status: Full.  Family Communication:  No family at bedside  Disposition Plan: Keep in SDU  IV access:  Peripheral IV  Procedures and diagnostic studies:    CXR 10/26/2014   Prominent vasculature and interstitium, likely mild congestive heart failure.    Medical Consultants:  Cardiology  PCCM - signed off  Other Consultants:  None  IAnti-Infectives/microbiology data:   Vancomycin 3/20 --> Aztreonam 3/20   Blood Cultures 3/20 --> Urine cultures 3/20 -->   Faye Ramsay, MD  [facility] Pager [phone]  If 7PM-7AM, please contact night-coverage [email or web address] Password [facility] 10/29/2014, 10:11 AM   LOS: 3 days   HPI/Subjective: No events overnight.   Objective: Filed Vitals:   10/29/14 0356 10/29/14 0718 10/29/14 0758 10/29/14 0939  BP: 125/92 159/74  150/69  Pulse: 73 79  85  Temp: 98.5 F (36.9 C) 99.2 F (37.3 C)    TempSrc: Oral Oral    Resp: 23 23    Height:      Weight: 106.9 kg (235 lb 10.8 oz)     SpO2: 96% 93% 98%     Intake/Output Summary (Last 24 hours) at 10/29/14 1011 Last data filed at 10/29/14 0944  Gross per 24 hour  Intake   1379 ml  Output    900 ml  Net    479 ml    Exam:   General:  Pt is alert, follows commands appropriately, in mild distress due to dyspnea and being on BiPAP  Cardiovascular: Regular rate and rhythm, no rubs, no gallops  Respiratory: Bilateral rales with  exp wheezing, mild tachypnea with RR 25 bpm  Abdomen: large ventral hernia, ant abrasion, no more erythema  Extremities: trace bilateral LE edema, right mid foot amputation, left LE with erythema on ant shin much improved, small diabetic foot ulcer   Data Reviewed: Basic Metabolic Panel:  Recent Labs Lab 10/26/14 0329 10/26/14 1033 10/27/14 0357 10/28/14 0228 10/29/14 0411  NA 138 139 141 137 133*  K 5.5* 5.1 4.4 4.3 4.6  CL 103 100 104 98 98  CO2 _0 GLUCOSE 240* 195* 185* 147* 237*  BUN 26* 26* 29* 39* 47*  CREATININE 1.37* 1.31 1.17 1.31 1.26  CALCIUM 8.7 8.9 8.6 8.4 8.4   Liver Function Tests:  Recent Labs Lab 10/26/14 0329  AST 21  ALT 17  ALKPHOS 70  BILITOT 0.5  PROT 6.4  ALBUMIN 3.0*    Recent Labs Lab 10/26/14 0329  LIPASE 17   CBC:  Recent Labs Lab 10/26/14 0329 10/26/14 1033 10/27/14 0357 10/28/14 0228 10/29/14 0411  WBC 14.0* 12.6* 12.5* 10.1 7.2  NEUTROABS 10.8* 9.5*  --   --   --   HGB 10.3* 10.6* 10.3* 8.7* 9.3*  HCT 33.3* 35.3* 34.4* 28.4* 30.0*  MCV 82.4 83.6 82.1 81.8 80.4  PLT 283 271 278 257 275   Cardiac Enzymes:  Recent Labs Lab 10/27/14 0357 10/27/14 1445 10/27/14 1957 10/27/14 2350 10/28/14 0228  TROPONINI 3.44* 3.41* 5.19* 4.90* 6.47*   CBG:  Recent Labs Lab 10/28/14 1945 10/28/14 2221 10/29/14 0013 10/29/14 0330 10/29/14 0809  GLUCAP 249* 251* 272* 218* 232*    Scheduled Meds: . antiseptic oral rinse  7 mL Mouth Rinse BID  . aspirin  300 mg Rectal Daily  . atorvastatin  80 mg Oral q1800  . aztreonam  2 g Intravenous 3 times per day  . ferrous gluconate  325 mg Oral BID  . furosemide  80 mg Intravenous BID  . gabapentin  300 mg Oral BID  . insulin aspart  0-9 Units Subcutaneous 6 times per day  . insulin glargine  40 Units Subcutaneous QHS  . levalbuterol  1.25 mg Nebulization Q6H  . methylPREDNISolone (SOLU-MEDROL) injection  40 mg Intravenous Q8H  . metoprolol tartrate  25 mg Oral BID   . pantoprazole  40 mg Oral Daily  . PARoxetine  40 mg Oral BH-q7a  . sennosides  5 mL Oral BID  . sodium chloride  10-40 mL Intracatheter Q12H  . sodium chloride  3 mL Intravenous Q12H  . vancomycin  750 mg Intravenous Q12H   Continuous Infusions: . heparin 1,900 Units/hr (10/29/14 0700)

## 2014-10-29 NOTE — Care Management Note (Signed)
    Page 1 of 1   11/03/2014     2:07:36 PM CARE MANAGEMENT NOTE 11/03/2014  Patient:  [name], [name]   Account Number:  [id number]  Date Initiated:  10/29/2014  Documentation initiated by:  COLE,ANGELA  Subjective/Objective Assessment:   PTA from home with sister admittred with CHF.     Action/Plan:   Return to home when medically stable. CM will f/u with discharge needs.   Anticipated DC Date:  11/02/2014   Anticipated DC Plan:  SKILLED NURSING FACILITY  In-house referral  Clinical Social Worker      DC Planning Services  CM consult      Choice offered to / List presented to:             Status of service:  Completed, signed off Medicare Important Message given?  YES (If response is "NO", the following Medicare IM given date fields will be blank) Date Medicare IM given:  10/29/2014 Medicare IM given by:  Whitman Hero Date Additional Medicare IM given:  11/03/2014 Additional Medicare IM given by:  Panhia Karl  Discharge Disposition:  [place]  Per UR Regulation:  Reviewed for med. necessity/level of care/duration of stay  If discussed at [place] of Stay Meetings, dates discussed:    Comments:  11/03/14 Ellan Lambert, RN, BSN [phone] PT recommending SNF for rehab.  Pt discharging to SNF today, per CSW arrangements.   10/29/2014 @ 11:00 Whitman Hero RN,BSN,CM CM spoke with pt and sister [name], [phone]) regardin home health needs/concerns. [facility] stated [facility], 8am-2pm. Sister also stated pt's PCP is with [facility]. Pt has a Programmer, multimedia @ home. No concerns voiced, CM will f/u with dsposition needs.

## 2014-10-29 NOTE — Progress Notes (Signed)
ANTICOAGULATIONand ANTIBIOTIC CONSULT NOTE - Follow Up Consult  Pharmacy Consult for Heparin, Vancomycin and Aztreonam Indication: chest pain/ACS and atrial fibrillation, sepsis and cellulitis coverage  Allergies  Allergen Reactions  . Penicillins Hives and Nausea And Vomiting    Patient Measurements: Height: 5' (152.4 cm) (10/08/14) Weight: 235 lb 10.8 oz (106.9 kg) IBW/kg (Calculated) : 50 Heparin Dosing Weight: 72 kg  Vital Signs: Temp: 98.2 F (36.8 C) (03/23 1130) Temp Source: Oral (03/23 1130) BP: 159/70 mmHg (03/23 1130) Pulse Rate: 77 (03/23 1130)  Labs:  Recent Labs  10/27/14 0357  10/27/14 1957 10/27/14 2350 10/28/14 0228 10/29/14 0410 10/29/14 0411  HGB 10.3*  --   --   --  8.7*  --  9.3*  HCT 34.4*  --   --   --  28.4*  --  30.0*  PLT 278  --   --   --  257  --  275  HEPARINUNFRC 0.21*  < > 0.38  --  0.46 0.61  --   CREATININE 1.17  --   --   --  1.31  --  1.26  TROPONINI 3.44*  < > 5.19* 4.90* 6.47*  --   --   < > = values in this interval not displayed.  Estimated Creatinine Clearance: 64.2 mL/min (by C-G formula based on Cr of 1.26).  Assessment:  Heparin level remains therapeutic (0.61) on 1900 units/hr. Hgb down from 10.3 to 8.7 yesterday, 9.3 today. No bleeding reported. Platelet count ok.  Home Pradaxa on hold. On ASA 300 mg PR daily, refused today. On ASA 81 mg at home.  Echo pending.     Day # 4 Vancomycin and Aztreonam. Tmax 99.2, WBC down to 7.2.  Blood cultures negative to date; urine culture with insignificant growth. Erythema noted improved. No PNA on CXR. BUN trended up some today, Scr 1.31>1.26. Antibiotic doses still ok. On IV Lasix.  Goal of Therapy:  Heparin level 0.3-0.7 units/ml Monitor platelets by anticoagulation protocol: Yes  Vancomycin trough levels 15-20 mcg/ml Appropriate Aztreonam dose for renal function and infection   Plan:   Continue heparin drip at 1900 units/hr.  Daily heparin level and CBC.  Follow up for resuming  Pradaxa when able, if no procedures planned.  Echo ordered 3/21, but pending.  Change PR Aspirin to 81 mg PO daily as prior to admit?   Continue Aztreonam 2grams IV q8hrs.  Continue Vancomycin 750 mg IV q12hrs.  Follow renal function, culture data, progress.  Will defer Vanc trough as narrow to possibly Levaquin planned soon.  Arty Baumgartner, [place] Pager: [phone] 10/29/2014,12:02 PM

## 2014-10-29 NOTE — Progress Notes (Signed)
Patient converted to a fib at 1139. HR 120-140s, has spiked to the 150s. PRN medication given with no relief. Dr. Doyle Askew with [facility] contacted for further orders.

## 2014-10-30 ENCOUNTER — Inpatient Hospital Stay (HOSPITAL_COMMUNITY): Payer: Medicare (Managed Care)

## 2014-10-30 LAB — GLUCOSE, CAPILLARY
GLUCOSE-CAPILLARY: 398 mg/dL — AB (ref 70–99)
GLUCOSE-CAPILLARY: 409 mg/dL — AB (ref 70–99)
GLUCOSE-CAPILLARY: 258 mg/dL — AB (ref 70–99)
Glucose-Capillary: 329 mg/dL — ABNORMAL HIGH (ref 70–99)
Glucose-Capillary: 235 mg/dL — ABNORMAL HIGH (ref 70–99)

## 2014-10-30 LAB — CBC
HEMATOCRIT: 29.2 % — AB (ref 39.0–52.0)
HEMOGLOBIN: 9 g/dL — AB (ref 13.0–17.0)
MCH: 24.8 pg — ABNORMAL LOW (ref 26.0–34.0)
MCHC: 30.8 g/dL (ref 30.0–36.0)
MCV: 80.4 fL (ref 78.0–100.0)
Platelets: 314 10*3/uL (ref 150–400)
RBC: 3.63 MIL/uL — ABNORMAL LOW (ref 4.22–5.81)
RDW: 15 % (ref 11.5–15.5)
WBC: 12.5 10*3/uL — AB (ref 4.0–10.5)

## 2014-10-30 LAB — HEPARIN LEVEL (UNFRACTIONATED)
HEPARIN UNFRACTIONATED: 0.94 [IU]/mL — AB (ref 0.30–0.70)
Heparin Unfractionated: 0.8 IU/mL — ABNORMAL HIGH (ref 0.30–0.70)
Heparin Unfractionated: 0.83 IU/mL — ABNORMAL HIGH (ref 0.30–0.70)

## 2014-10-30 MED ORDER — METOPROLOL TARTRATE 50 MG PO TABS
50.0000 mg | ORAL_TABLET | Freq: Two times a day (BID) | ORAL | Status: DC
  Administered 2014-10-30 – 2014-11-03 (×8): 50 mg via ORAL
  Filled 2014-10-30 (×9): qty 1

## 2014-10-30 MED ORDER — BISACODYL 10 MG RE SUPP
10.0000 mg | Freq: Two times a day (BID) | RECTAL | Status: DC
  Administered 2014-10-30 – 2014-11-01 (×5): 10 mg via RECTAL
  Filled 2014-10-30 (×5): qty 1

## 2014-10-30 MED ORDER — HEPARIN (PORCINE) IN NACL 100-0.45 UNIT/ML-% IJ SOLN
1650.0000 [IU]/h | INTRAMUSCULAR | Status: DC
  Administered 2014-10-30: 1800 [IU]/h via INTRAVENOUS
  Filled 2014-10-30 (×3): qty 250

## 2014-10-30 MED ORDER — METHYLPREDNISOLONE SODIUM SUCC 40 MG IJ SOLR
40.0000 mg | Freq: Every day | INTRAMUSCULAR | Status: DC
  Administered 2014-10-31 – 2014-11-01 (×2): 40 mg via INTRAVENOUS
  Filled 2014-10-30 (×2): qty 1

## 2014-10-30 MED ORDER — INSULIN ASPART 100 UNIT/ML ~~LOC~~ SOLN
5.0000 [IU] | Freq: Three times a day (TID) | SUBCUTANEOUS | Status: DC
  Administered 2014-10-30 – 2014-10-31 (×4): 5 [IU] via SUBCUTANEOUS

## 2014-10-30 MED ORDER — ASPIRIN 81 MG PO CHEW
81.0000 mg | CHEWABLE_TABLET | Freq: Every day | ORAL | Status: DC
  Administered 2014-10-31 – 2014-11-03 (×4): 81 mg via ORAL
  Filled 2014-10-30 (×2): qty 1

## 2014-10-30 MED ORDER — HEPARIN (PORCINE) IN NACL 100-0.45 UNIT/ML-% IJ SOLN
1450.0000 [IU]/h | INTRAMUSCULAR | Status: DC
  Administered 2014-10-30 – 2014-10-31 (×2): 1450 [IU]/h via INTRAVENOUS
  Filled 2014-10-30 (×2): qty 250

## 2014-10-30 MED ORDER — INSULIN ASPART 100 UNIT/ML ~~LOC~~ SOLN
0.0000 [IU] | Freq: Three times a day (TID) | SUBCUTANEOUS | Status: DC
  Administered 2014-10-30: 15 [IU] via SUBCUTANEOUS
  Administered 2014-10-31: 4 [IU] via SUBCUTANEOUS
  Administered 2014-10-31: 11 [IU] via SUBCUTANEOUS
  Administered 2014-10-31: 4 [IU] via SUBCUTANEOUS
  Administered 2014-11-01: 7 [IU] via SUBCUTANEOUS
  Administered 2014-11-01 – 2014-11-02 (×2): 4 [IU] via SUBCUTANEOUS
  Administered 2014-11-02: 3 [IU] via SUBCUTANEOUS
  Administered 2014-11-02: 7 [IU] via SUBCUTANEOUS
  Administered 2014-11-03: 4 [IU] via SUBCUTANEOUS

## 2014-10-30 MED ORDER — DOXYCYCLINE HYCLATE 100 MG PO TABS
100.0000 mg | ORAL_TABLET | Freq: Two times a day (BID) | ORAL | Status: DC
  Administered 2014-10-30 – 2014-11-03 (×8): 100 mg via ORAL
  Filled 2014-10-30 (×10): qty 1

## 2014-10-30 MED ORDER — INSULIN ASPART 100 UNIT/ML ~~LOC~~ SOLN
25.0000 [IU] | Freq: Once | SUBCUTANEOUS | Status: AC
  Administered 2014-10-30: 25 [IU] via SUBCUTANEOUS

## 2014-10-30 NOTE — Progress Notes (Signed)
1830 report received from [place] Transferred in via bed . Awake ,alert and appropriate affect . Safety precaution observed. CMT aware of pt

## 2014-10-30 NOTE — Progress Notes (Signed)
ANTICOAGULATION CONSULT NOTE - Follow Up Consult  Pharmacy Consult for Heparin Indication: chest pain/ACS and atrial fibrillation  Allergies  Allergen Reactions  . Penicillins Hives and Nausea And Vomiting    Patient Measurements: Height: 5' (152.4 cm) Weight: 235 lb 10.8 oz (106.9 kg) IBW/kg (Calculated) : 50 Heparin Dosing Weight: 72 kg  Vital Signs: Temp: 98.3 F (36.8 C) (03/24 1911) Temp Source: Oral (03/24 1911) BP: 151/51 mmHg (03/24 1911) Pulse Rate: 67 (03/24 1911)  Labs:  Recent Labs  10/27/14 2350  10/28/14 0228  10/29/14 0411 10/30/14 0500 10/30/14 0600 10/30/14 1356 10/30/14 2132  HGB  --   < > 8.7*  --  9.3* 9.0*  --   --   --   HCT  --   --  28.4*  --  30.0* 29.2*  --   --   --   PLT  --   --  257  --  275 314  --   --   --   HEPARINUNFRC  --   --  0.46  < >  --   --  0.80* 0.83* 0.94*  CREATININE  --   --  1.31  --  1.26  --   --   --   --   TROPONINI 4.90*  --  6.47*  --   --   --   --   --   --   < > = values in this interval not displayed.  Estimated Creatinine Clearance: 64.2 mL/min (by C-G formula based on Cr of 1.26).  Assessment: Pt on heparin for afib. Heparin level remains supratherapeutic (0.83) on 1800 units/hr. Hgb 9.0 stable, pltc 314K stable.  No bleeding reported. Appears that heparin level drawn correctly. Home Pradaxa on hold. No plan for invasive cardiac evaluation at this time. Per cardiology note, plan to discontinue heparin tomorrow morning and start back on Pradaxa if ok with primary team  Heparin level elevated this PM at 1650 units / hr    Goal of Therapy:  Heparin level 0.3-0.7 units/ml Monitor platelets by anticoagulation protocol: Yes     Plan:  Hold heparin x 1 hour  Decrease heparin to 1450 units / hr  Daily heparin level and CBC. Follow up for resuming Pradaxa 3/25  Thank you. Anette Guarneri, PharmD [phone]  10/30/2014,10:03 PM

## 2014-10-30 NOTE — Progress Notes (Signed)
Pt's blood sugar is 409, pt asymptomatic. Pt is complaining of increased abdominal pain around hernia. Notified MD. New insulin orders given. Will continue to monitor patient.

## 2014-10-30 NOTE — Progress Notes (Signed)
Inpatient Diabetes Program Recommendations  AACE/ADA: New Consensus Statement on Inpatient Glycemic Control (2013)  Target Ranges:  Prepandial:   less than 140 mg/dL      Peak postprandial:   less than 180 mg/dL (1-2 hours)      Critically ill patients:  140 - 180 mg/dL   Results for [name], [name] (MRN [id number]) as of 10/30/2014 09:45  Ref. Range 10/29/2014 08:09 10/29/2014 11:41 10/29/2014 15:30 10/29/2014 21:20 10/30/2014 07:30  Glucose-Capillary Latest Range: 70-99 mg/dL 232 (H) 249 (H) 278 (H) 364 (H) 398 (H)   Diabetes history: DM 2 Outpatient Diabetes medications: Lantus 60 units QAM, 40 units QPM, Metformin 1,000 mg BID Current orders for Inpatient glycemic control: Novolog 0-15 units TID, Novolog 3 units Meal coverage, Lantus 40 units QHS  Inpatient Diabetes Program Recommendations Insulin - Basal: Please temporarily increase basal to Lantus 50 units QHS. Once patient is being tapered from steroids, will have to decrease the basal insulin to home dose again. Correction (SSI): Please increase correction scale to Novolog 0-20 units (resistant scale) TID.   Thanks,  Tama Headings RN, MSN, [facility] Inpatient Diabetes Coordinator Team Pager [phone]

## 2014-10-30 NOTE — Progress Notes (Signed)
ANTICOAGULATION CONSULT NOTE - Follow Up Consult  Pharmacy Consult for Heparin Indication: chest pain/ACS and atrial fibrillation  Allergies  Allergen Reactions  . Penicillins Hives and Nausea And Vomiting    Patient Measurements: Height: 5' (152.4 cm) (10/08/14) Weight: 235 lb 10.8 oz (106.9 kg) IBW/kg (Calculated) : 50 Heparin Dosing Weight: 72 kg  Vital Signs: Temp: 98.7 F (37.1 C) (03/24 0423) Temp Source: Oral (03/24 0423) BP: 145/73 mmHg (03/24 0425) Pulse Rate: 68 (03/24 0425)  Labs:  Recent Labs  10/27/14 1957 10/27/14 2350  10/28/14 0228 10/29/14 0410 10/29/14 0411 10/30/14 0500 10/30/14 0600  HGB  --   --   < > 8.7*  --  9.3* 9.0*  --   HCT  --   --   --  28.4*  --  30.0* 29.2*  --   PLT  --   --   --  257  --  275 314  --   HEPARINUNFRC 0.38  --   --  0.46 0.61  --   --  0.80*  CREATININE  --   --   --  1.31  --  1.26  --   --   TROPONINI 5.19* 4.90*  --  6.47*  --   --   --   --   < > = values in this interval not displayed.  Estimated Creatinine Clearance: 64.2 mL/min (by C-G formula based on Cr of 1.26).  Assessment: AM Heparin level increased to 0.8, SUPRAtherapeutic on 1900 units/hr. Hgb 9.0 stable, pltc 314K stable.  No bleeding reported. RN reports heparin level drawn from PICC line as done previously, but paused heparin infusion and flushed prior to heparin level drawn. Possibly elevated due to drawing from PICC, but may also be accurate and trending up.   Home Pradaxa on hold. On ASA 300 mg PR daily. On ASA 81 mg at home.  Echo pending.      Goal of Therapy:  Heparin level 0.3-0.7 units/ml Monitor platelets by anticoagulation protocol: Yes  Vancomycin trough levels 15-20 mcg/ml Appropriate Aztreonam dose for renal function and infection   Plan:  Decrease heparin drip to 1800 units/hr. F/u 6 hr heparin level Daily heparin level and CBC. Follow up for resuming Pradaxa when able, if no procedures planned.  Echo ordered 3/21, but  pending. Change PR Aspirin to 81 mg PO daily as prior to admit?    Nicole Cella, RPh Clinical Pharmacist Pager: [phone]  10/30/2014,7:22 AM

## 2014-10-30 NOTE — Progress Notes (Signed)
Medicare Important Message given? YES (If response is "NO", the following Medicare IM given date fields will be blank) Date Medicare IM given:10/30/2014 Medicare IM given by: Whitman Hero

## 2014-10-30 NOTE — Progress Notes (Signed)
    Subjective:  No chest pain or shortness of breath. Complaining of abdominal discomfort and nausea.  Objective:  Vital Signs in the last 24 hours: Temp:  [98 F (36.7 C)-98.7 F (37.1 C)] 98.3 F (36.8 C) (03/24 1204) Pulse Rate:  [65-79] 65 (03/24 0720) Resp:  [17-20] 17 (03/24 0720) BP: (104-145)/(49-73) 130/49 mmHg (03/24 0720) SpO2:  [92 %-98 %] 96 % (03/24 0720)  Intake/Output from previous day: 03/23 0701 - 03/24 0700 In: 1528 [P.O.:1080; I.V.:248; IV Piggyback:200] Out: 1075 [Urine:1075]  Physical Exam: Pt is alert and oriented, NAD HEENT: normal Neck: JVP - normal Lungs: CTA bilaterally CV: RRR grade 2/6 ejection murmur at the left sternal border Abd: soft, NT Ext: no C/C/E, distal pulses intact and equal Skin: warm/dry no rash   Lab Results:  Recent Labs  10/29/14 0411 10/30/14 0500  WBC 7.2 12.5*  HGB 9.3* 9.0*  PLT 275 314    Recent Labs  10/28/14 0228 10/29/14 0411  NA 137 133*  K 4.3 4.6  CL 98 98  CO2 30 29  GLUCOSE 147* 237*  BUN 39* 47*  CREATININE 1.31 1.26    Recent Labs  10/27/14 2350 10/28/14 0228  TROPONINI 4.90* 6.47*   Tele: Sinus rhythm  Assessment/Plan:  1. Atrial fibrillation with RVR, now maintaining sinus rhythm 2. Non-ST elevation MI, may also be a result of demand ischemia 3. Acute on chronic diastolic heart failure  Patient with multiple severe comorbid conditions. I do not think he is a good candidate for invasive cardiac evaluation. I would favor treating him conservatively for non-ST elevation infarction. He has not had any chest discomfort. He is currently on aspirin, a statin drug, and a beta blocker. He is anticoagulated because of atrial fibrillation. As long as it is okay with the primary team, I would be inclined to discontinue heparin tomorrow morning and start him back on Pradaxa for chronic oral anticoagulation. Will consolidate his beta-blocker.  Sherren Mocha, M.D. 10/30/2014, 2:43 PM

## 2014-10-30 NOTE — Progress Notes (Signed)
ANTICOAGULATION CONSULT NOTE - Follow Up Consult  Pharmacy Consult for Heparin Indication: chest pain/ACS and atrial fibrillation  Allergies  Allergen Reactions  . Penicillins Hives and Nausea And Vomiting    Patient Measurements: Height: 5' (152.4 cm) (10/08/14) Weight: 235 lb 10.8 oz (106.9 kg) IBW/kg (Calculated) : 50 Heparin Dosing Weight: 72 kg  Vital Signs: Temp: 98.3 F (36.8 C) (03/24 1204) Temp Source: Oral (03/24 1204) BP: 130/49 mmHg (03/24 0720) Pulse Rate: 65 (03/24 0720)  Labs:  Recent Labs  10/27/14 1957 10/27/14 2350  10/28/14 0228 10/29/14 0410 10/29/14 0411 10/30/14 0500 10/30/14 0600 10/30/14 1356  HGB  --   --   < > 8.7*  --  9.3* 9.0*  --   --   HCT  --   --   --  28.4*  --  30.0* 29.2*  --   --   PLT  --   --   --  257  --  275 314  --   --   HEPARINUNFRC 0.38  --   --  0.46 0.61  --   --  0.80* 0.83*  CREATININE  --   --   --  1.31  --  1.26  --   --   --   TROPONINI 5.19* 4.90*  --  6.47*  --   --   --   --   --   < > = values in this interval not displayed.  Estimated Creatinine Clearance: 64.2 mL/min (by C-G formula based on Cr of 1.26).  Assessment: Pt on heparin for afib. Heparin level remains supratherapeutic (0.83) on 1800 units/hr. Hgb 9.0 stable, pltc 314K stable.  No bleeding reported. Appears that heparin level drawn correctly. Home Pradaxa on hold. No plan for invasive cardiac evaluation at this time. Per cardiology note, plan to discontinue heparin tomorrow morning and start back on Pradaxa if ok with primary team    Goal of Therapy:  Heparin level 0.3-0.7 units/ml Monitor platelets by anticoagulation protocol: Yes     Plan:  Decrease heparin drip to 1650 units/hr. F/u 6 hr heparin level Daily heparin level and CBC. Follow up for resuming Pradaxa 3/25  Sherlon Handing, PharmD, BCPS Clinical pharmacist, pager [phone] 10/30/2014,3:35 PM

## 2014-10-30 NOTE — Progress Notes (Signed)
Patient ID: [name], male   DOB: 1955-10-12, 59 y.o.   MRN: [id number]  [facility] PROGRESS NOTE  [name]:[id number] DOB: 1955-09-10 DOA: 10/26/2014 PCP: [name], MD   Brief narrative:    59 y.o. 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 This has been associated with malaise, poor oral intake, subjective fevers, substernal area chest pain, non radiating.   In ED, pt noted to be hypoxic with oxygen saturation in mid 80's and requiring placement on BiPAP, initial temp (oral) 99.4 F but rectal temp 100.2 F. RR in 30's, BP 119/51. Blood work notable for WBC 14 K, K 5.5, Cr 1.37, troponin 0.12. Marland Kitchen ECG demonstrated NSR, RBBB, RAD, ST depression in leads V2-V6. Compared to 04/23/13, the anterolateral STD are new. CXR concerning for pulmonary vascular congestion and cardiology consulted. TR[facility] asked to admit to SDU for further evaluation.   Major events since admission:  3/20 - admit to SDU, concern ACS, d CHF, sepsis of unclear source   3/21 - persistent a-fib with RVR, pt now DNI  3/22 - troponins continue trending up, added solumedrol due to wheezing   3/23 - more chest pain this AM, pt apparently did not get any Lasix 3/22, restarted this AM 3/23  3/24 - no chest pain but with more abd pain, no BM for three days   Assessment/Plan:    Principal Problem:   Acute hypoxic respiratory failure - appears to be multifactorial and setting of acute diastolic CHF and NSTEMI in pt with known  COPD (yrs of smoking) - appreciate cardiology team assistance  - pt looks better this am and denies chest pain - weight is trending down since admission: 242 lbs --> 235 lbs this AM, weight pending this AM - continue Lasix 80 mg IV BID for now and will possibly be abel to transition to PO in 1-2 days  - continue Metoprolol and atorvastatin (increased dose to 80 mg PO QD)  - agree with stopping Heparin in AM and starting Pradaxa    Sepsis, likely secondary to LLE cellulitis  - criteria for sepsis met: T 100.2 F, RR in 30's, WBC 14 K, lactic acid 2.36, multiple organ systems failure  - urine culture so far with no sign growth, blood cultures negative, LLE cellulitis better - will narrow ABX to doxycycline    Acute on chronic diastolic CHF - management with Lasix as noted above, per cardiology - daily weights and strict I's and O's - weight on admission: 242 lbs --> 235 lbs --> pending this AM   Acute on chronic COPD - continue solumedrol and continue BD's scheduled and as needed  - will start tapering solumedrol    NSTEMI - likely demand ischemia - management as noted above per cardiology  - stop Heparin drip in AM and resume Pradaxa    Paroxysmal atrial fibrillation, CHADS score 5 (CHF, HTN, DM, stroke) - continue Metoprolol and Cardizem   Active Problems:   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 - pt has at least stage  II CKD based on record reveiw with GFR in 70's with normal Cr but presence of multiple risk factors including HTN, HLD, DM - Cr is now WNL - holding Metformin    LLE cellulitis with diabetic ulcer  - wound care consulted - erythema much improved  - ABX as noted above   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, neuropathies  - A1C 7.9, pt on high dose insulin at home - holding Metformin for now and continue SSI and Lantus 40 U QHS   Hyperkalemia - WNL this AM   HLD - continue statin but increased the dose to 80 mg per cardiology team recommendations    Essential  hypertension - reasonably stable    Ventral hernia - abd now with no erythema and non TTP - close monitoring - pt determined in the past to be poor surgical candidate    Iron deficiency anemia - no signs of active bleeding - repeat CBC in AM   Morbid obesity  - Body mass index is 47.28  DVT prophylaxis - on Heparin drip per ACS protocol   Code Status: Full.  Family Communication:  No family at bedside  Disposition Plan: transfer to telemetry bed   IV access:  Peripheral IV  Procedures and diagnostic studies:    CXR 10/26/2014   Prominent vasculature and interstitium, likely mild congestive heart failure.    Dg Chest Port 1 View  10/29/2014 Persistent interstitial thickening and mild cardiomegaly with a small new area of airspace opacity above the right minor fissure, likely airspace edema. Findings consistent with slightly worsened congestive heart failure. PICC tip in these mid superior vena cava.   Dg Abd Portable 2v  10/30/2014   Normal bowel gas pattern, no free air. Suspect persistent large ventral abdominal hernia.    Medical Consultants:  Cardiology  PCCM - signed off  Other Consultants:  None  IAnti-Infectives/microbiology data:   Vancomycin 3/20 --> Aztreonam 3/20   Blood Cultures 3/20 --> Urine cultures 3/20 -->   Faye Ramsay, MD  [facility] Pager [phone]  If 7PM-7AM, please contact night-coverage [email or web address] Password [facility] 10/30/2014, 4:47 PM   LOS: 4 days   HPI/Subjective: No events overnight.   Objective: Filed Vitals:   10/30/14 0425 10/30/14 0700 10/30/14 0720 10/30/14 1204  BP: 145/73  130/49   Pulse: 68  65   Temp:  98.1 F (36.7 C)  98.3 F (36.8 C)  TempSrc:  Oral  Oral  Resp: 19  17   Height:      Weight:      SpO2: 98%  96%     Intake/Output Summary (Last 24 hours) at 10/30/14 1647 Last data filed at 10/30/14 0800  Gross per 24 hour  Intake 1192.15 ml  Output    425 ml  Net 767.15 ml    Exam:   General:  Pt is  alert, follows commands appropriately, in mild distress due to dyspnea and being on BiPAP  Cardiovascular: Irregular rate and rhythm, no rubs, no gallops  Respiratory: Bibasilar crackles and diminished breath sounds at bases   Abdomen: large ventral hernia, ant abrasion, no more erythema, abd slightly hard to touch   Extremities: trace bilateral LE edema, right mid foot amputation, left LE with erythema on ant shin much improved, small diabetic foot ulcer   Data Reviewed: Basic Metabolic Panel:  Recent Labs Lab 10/26/14 0329 10/26/14 1033 10/27/14 0357 10/28/14 0228 10/29/14 0411  NA 138 139 141 137 133*  K 5.5* 5.1  4.4 4.3 4.6  CL 103 100 104 98 98  CO2 '28 29 29 30 29  ' GLUCOSE 240* 195* 185* 147* 237*  BUN 26* 26* 29* 39* 47*  CREATININE 1.37* 1.31 1.17 1.31 1.26  CALCIUM 8.7 8.9 8.6 8.4 8.4   Liver Function Tests:  Recent Labs Lab 10/26/14 0329  AST 21  ALT 17  ALKPHOS 70  BILITOT 0.5  PROT 6.4  ALBUMIN 3.0*    Recent Labs Lab 10/26/14 0329  LIPASE 17   CBC:  Recent Labs Lab 10/26/14 0329 10/26/14 1033 10/27/14 0357 10/28/14 0228 10/29/14 0411 10/30/14 0500  WBC 14.0* 12.6* 12.5* 10.1 7.2 12.5*  NEUTROABS 10.8* 9.5*  --   --   --   --   HGB 10.3* 10.6* 10.3* 8.7* 9.3* 9.0*  HCT 33.3* 35.3* 34.4* 28.4* 30.0* 29.2*  MCV 82.4 83.6 82.1 81.8 80.4 80.4  PLT 283 271 278 257 275 314   Cardiac Enzymes:  Recent Labs Lab 10/27/14 0357 10/27/14 1445 10/27/14 1957 10/27/14 2350 10/28/14 0228  TROPONINI 3.44* 3.41* 5.19* 4.90* 6.47*   CBG:  Recent Labs Lab 10/29/14 1141 10/29/14 1530 10/29/14 2120 10/30/14 0730 10/30/14 1152  GLUCAP 249* 278* 364* 398* 409*    Scheduled Meds: . antiseptic oral rinse  7 mL Mouth Rinse BID  . [START ON 10/31/2014] aspirin  81 mg Oral Daily  . atorvastatin  80 mg Oral q1800  . aztreonam  2 g Intravenous 3 times per day  . ferrous gluconate  324 mg Oral BID WC  . furosemide  80 mg Intravenous BID  .  gabapentin  300 mg Oral BID  . insulin aspart  0-20 Units Subcutaneous TID WC  . insulin aspart  5 Units Subcutaneous TID WC  . insulin glargine  40 Units Subcutaneous QHS  . levalbuterol  1.25 mg Nebulization TID  . methylPREDNISolone (SOLU-MEDROL) injection  40 mg Intravenous Q8H  . metoprolol tartrate  50 mg Oral BID  . pantoprazole  40 mg Oral Daily  . PARoxetine  40 mg Oral BH-q7a  . sennosides  5 mL Oral BID  . sodium chloride  10-40 mL Intracatheter Q12H  . sodium chloride  3 mL Intravenous Q12H  . vancomycin  750 mg Intravenous Q12H   Continuous Infusions: . heparin 1,650 Units/hr (10/30/14 1549)

## 2014-10-31 LAB — BASIC METABOLIC PANEL
ANION GAP: 5 (ref 5–15)
BUN: 72 mg/dL — ABNORMAL HIGH (ref 6–23)
CO2: 29 mmol/L (ref 19–32)
Calcium: 8.3 mg/dL — ABNORMAL LOW (ref 8.4–10.5)
Chloride: 98 mmol/L (ref 96–112)
Creatinine, Ser: 1.19 mg/dL (ref 0.50–1.35)
GFR calc Af Amer: 75 mL/min — ABNORMAL LOW (ref >90)
GFR, EST NON AFRICAN AMERICAN: 65 mL/min — AB (ref >90)
Glucose, Bld: 280 mg/dL — ABNORMAL HIGH (ref 70–99)
Potassium: 4.4 mmol/L (ref 3.5–5.1)
Sodium: 132 mmol/L — ABNORMAL LOW (ref 135–145)

## 2014-10-31 LAB — CBC
HCT: 31.4 % — ABNORMAL LOW (ref 39.0–52.0)
Hemoglobin: 9.9 g/dL — ABNORMAL LOW (ref 13.0–17.0)
MCH: 24.8 pg — AB (ref 26.0–34.0)
MCHC: 31.5 g/dL (ref 30.0–36.0)
MCV: 78.5 fL (ref 78.0–100.0)
PLATELETS: 337 10*3/uL (ref 150–400)
RBC: 4 MIL/uL — ABNORMAL LOW (ref 4.22–5.81)
RDW: 14.7 % (ref 11.5–15.5)
WBC: 16.1 10*3/uL — ABNORMAL HIGH (ref 4.0–10.5)

## 2014-10-31 LAB — HEPARIN LEVEL (UNFRACTIONATED)
Heparin Unfractionated: 0.48 IU/mL (ref 0.30–0.70)
Heparin Unfractionated: 0.62 IU/mL (ref 0.30–0.70)

## 2014-10-31 LAB — GLUCOSE, CAPILLARY
GLUCOSE-CAPILLARY: 262 mg/dL — AB (ref 70–99)
GLUCOSE-CAPILLARY: 189 mg/dL — AB (ref 70–99)
GLUCOSE-CAPILLARY: 147 mg/dL — AB (ref 70–99)
Glucose-Capillary: 159 mg/dL — ABNORMAL HIGH (ref 70–99)

## 2014-10-31 MED ORDER — NITROGLYCERIN 0.4 MG SL SUBL
0.4000 mg | SUBLINGUAL_TABLET | SUBLINGUAL | Status: DC | PRN
Start: 2014-10-31 — End: 2014-11-03

## 2014-10-31 MED ORDER — ISOSORBIDE MONONITRATE 15 MG HALF TABLET
15.0000 mg | ORAL_TABLET | Freq: Every day | ORAL | Status: DC
  Administered 2014-10-31 – 2014-11-03 (×4): 15 mg via ORAL
  Filled 2014-10-31 (×4): qty 1

## 2014-10-31 MED ORDER — DABIGATRAN ETEXILATE MESYLATE 150 MG PO CAPS
150.0000 mg | ORAL_CAPSULE | Freq: Two times a day (BID) | ORAL | Status: DC
  Administered 2014-10-31 – 2014-11-03 (×7): 150 mg via ORAL
  Filled 2014-10-31 (×9): qty 1

## 2014-10-31 MED ORDER — DILTIAZEM HCL 100 MG IV SOLR
5.0000 mg/h | INTRAVENOUS | Status: DC
  Administered 2014-10-31: 5 mg/h via INTRAVENOUS
  Filled 2014-10-31: qty 100

## 2014-10-31 NOTE — Progress Notes (Signed)
1645 with 100% return flow after enema of clear fluid with small amount of fecal material  Tolrerated procedure well

## 2014-10-31 NOTE — Progress Notes (Signed)
Patient Profile: 59 y/o male former smoker with mental retardation, HTN, DM c/b chronic LE wounds, HLD, AF, prior stroke, mild carotid artery disease, large ventral hernia, and COPD who presented with chest pain and SOB. Found to be in acute pulmonary edema and atrial fibrillation w/ RVR.  Subjective: Complaining of "heart burn". Substernal. Started about 30 minutes ago. No dyspnea.   Objective: Vital signs in last 24 hours: Temp:  [98.3 F (36.8 C)-99 F (37.2 C)] 98.4 F (36.9 C) (03/25 0400) Pulse Rate:  [63-72] 66 (03/25 0400) Resp:  [16-20] 20 (03/25 0400) BP: (139-167)/(51-92) 157/75 mmHg (03/25 0400) SpO2:  [93 %-98 %] 96 % (03/25 0831) Weight:  [237 lb 6.4 oz (107.684 kg)] 237 lb 6.4 oz (107.684 kg) (03/25 0700) Last BM Date: 10/31/14  Intake/Output from previous day: 03/24 0701 - 03/25 0700 In: 1388.8 [P.O.:480; I.V.:608.8; IV Piggyback:300] Out: 876 [Urine:875; Stool:1] Intake/Output this shift: Total I/O In: 120 [P.O.:120] Out: 700 [Urine:700]  Medications Current Facility-Administered Medications  Medication Dose Route Frequency Provider Last Rate Last Dose  . 0.9 %  sodium chloride infusion  250 mL Intravenous PRN Theressa Millard, MD 5.5 mL/hr at 10/30/14 2335 250 mL at 10/30/14 2335  . acetaminophen (TYLENOL) tablet 650 mg  650 mg Oral Q4H PRN Theressa Millard, MD   650 mg at 10/30/14 2030  . antiseptic oral rinse (CPC / CETYLPYRIDINIUM CHLORIDE 0.05%) solution 7 mL  7 mL Mouth Rinse BID Theodis Blaze, MD   7 mL at 10/30/14 1106  . aspirin chewable tablet 81 mg  81 mg Oral Daily Sherren Mocha, MD      . atorvastatin (LIPITOR) tablet 80 mg  80 mg Oral q1800 Theodis Blaze, MD   80 mg at 10/30/14 1756  . bisacodyl (DULCOLAX) suppository 10 mg  10 mg Rectal BID Theodis Blaze, MD   10 mg at 10/30/14 1757  . clonazePAM (KLONOPIN) tablet 1 mg  1 mg Oral TID PRN Theodis Blaze, MD   1 mg at 10/30/14 2217  . doxycycline (VIBRA-TABS) tablet 100 mg  100 mg Oral Q12H  Theodis Blaze, MD   100 mg at 10/30/14 2229  . ferrous gluconate (FERGON) tablet 324 mg  324 mg Oral BID WC Theodis Blaze, MD   324 mg at 10/30/14 1756  . furosemide (LASIX) injection 80 mg  80 mg Intravenous BID Theodis Blaze, MD   80 mg at 10/30/14 1756  . gabapentin (NEURONTIN) capsule 300 mg  300 mg Oral BID Theressa Millard, MD   300 mg at 10/30/14 2219  . heparin ADULT infusion 100 units/mL (25000 units/250 mL)  1,450 Units/hr Intravenous Continuous Wendee Beavers, RPH 14.5 mL/hr at 10/31/14 4163 1,450 Units/hr at 10/31/14 8453  . HYDROcodone-acetaminophen (NORCO/VICODIN) 5-325 MG per tablet 1 tablet  1 tablet Oral Q6H PRN Theodis Blaze, MD   1 tablet at 10/31/14 0804  . insulin aspart (novoLOG) injection 0-20 Units  0-20 Units Subcutaneous TID WC Theodis Blaze, MD   11 Units at 10/31/14 0802  . insulin aspart (novoLOG) injection 5 Units  5 Units Subcutaneous TID WC Theodis Blaze, MD   5 Units at 10/30/14 1623  . insulin glargine (LANTUS) injection 40 Units  40 Units Subcutaneous QHS Theressa Millard, MD   40 Units at 10/30/14 2221  . levalbuterol (XOPENEX) nebulizer solution 0.63 mg  0.63 mg Nebulization Q6H PRN Collene Gobble, MD   0.63 mg at 10/28/14 1109  .  levalbuterol (XOPENEX) nebulizer solution 1.25 mg  1.25 mg Nebulization TID Theodis Blaze, MD   1.25 mg at 10/31/14 0829  . methylPREDNISolone sodium succinate (SOLU-MEDROL) 40 mg/mL injection 40 mg  40 mg Intravenous Daily Theodis Blaze, MD      . metoprolol (LOPRESSOR) injection 5 mg  5 mg Intravenous Q2H PRN Evelene Croon Barrett, PA-C      . metoprolol (LOPRESSOR) tablet 50 mg  50 mg Oral BID Sherren Mocha, MD   50 mg at 10/30/14 2217  . ondansetron (ZOFRAN) injection 4 mg  4 mg Intravenous Q6H PRN Theressa Millard, MD   4 mg at 10/30/14 1350  . pantoprazole (PROTONIX) EC tablet 40 mg  40 mg Oral Daily Theressa Millard, MD   40 mg at 10/30/14 1106  . PARoxetine (PAXIL) tablet 40 mg  40 mg Oral BH-q7a Theressa Millard, MD   40 mg  at 10/31/14 0804  . sennosides (SENOKOT) 8.8 MG/5ML syrup 5 mL  5 mL Oral BID Theodis Blaze, MD   5 mL at 10/30/14 2220  . sodium chloride 0.9 % injection 10-40 mL  10-40 mL Intracatheter Q12H Theodis Blaze, MD   30 mL at 10/30/14 1107  . sodium chloride 0.9 % injection 10-40 mL  10-40 mL Intracatheter PRN Theodis Blaze, MD      . sodium chloride 0.9 % injection 3 mL  3 mL Intravenous Q12H Theressa Millard, MD   3 mL at 10/30/14 1108  . sodium chloride 0.9 % injection 3 mL  3 mL Intravenous PRN Theressa Millard, MD        PE: General appearance: alert, cooperative and no distress Neck: no carotid bruit and no JVD Lungs: clear to auscultation bilaterally Heart: regular rate and rhythm, S1, S2 normal, no murmur, click, rub or gallop Extremities: no LEE Pulses: 2+ and symmetric Skin: warm and dry Neurologic: Grossly normal  Lab Results:   Recent Labs  10/29/14 0411 10/30/14 0500 10/31/14 0455  WBC 7.2 12.5* 16.1*  HGB 9.3* 9.0* 9.9*  HCT 30.0* 29.2* 31.4*  PLT 275 314 337   BMET  Recent Labs  10/29/14 0411 10/31/14 0455  NA 133* 132*  K 4.6 4.4  CL 98 98  CO2 29 29  GLUCOSE 237* 280*  BUN 47* 72*  CREATININE 1.26 1.19  CALCIUM 8.4 8.3*    Assessment/Plan  Principal Problem:   Acute diastolic CHF (congestive heart failure) Active Problems:   DM (diabetes mellitus), type 2, uncontrolled   HYPERCHOLESTEROLEMIA   Essential hypertension   Atrial fibrillation   Ventral hernia   Hyperkalemia   Iron deficiency anemia   Abrasion of abdominal wall   Abdominal hernia   Elevated troponin   Acute respiratory failure with hypoxia   Acute on chronic respiratory failure   1. Atrial Fibrillation w/ RVR: Maintaining NSR. Continue metoprolol. Still on IV heparin. Consider transition back to Pradaxa.  2. NSTEMI: based on multiple severe comorbid conditions, he is not a good candidate for invasive cardiac evaluation. Continue conservative management with medical  therapy. ASA, statin + BB. Now complaining of "heatburn" ? Angina vs GERD. BP in the 629B systolic.Continue Protonix. PRN SL NTG. Will start Imdur.    3. Acute on Chronic Diastolic HF: continue Lasix, BB and low sodium diet.     LOS: 5 days    Brittainy M. Ladoris Gene 10/31/2014 9:22 AM  Patient seen, examined. Available data reviewed. Agree with findings, assessment, and plan  as outlined by Lyda Jester, PA-C. The patient is independently interviewed and examined. On my evaluation, he is resting comfortably. He complains of constipation and back pain. He denies chest pain or shortness of breath. I agree with plans above to start isosorbide for medical therapy of presumed CAD. The patient is stable from a cardiac perspective. He is maintaining sinus rhythm. Recommend transitioning him from IV heparin back to Pradaxa as suggested. Will sign off today as his cardiac status appears stable. Please call if any other issues arise. Thanks  Sherren Mocha, M.D. 10/31/2014 11:55 AM

## 2014-10-31 NOTE — Consult Note (Signed)
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   Orders on the chart for wound care.  Monserrath Junio [place] RN,CWOCN [phone]

## 2014-10-31 NOTE — Progress Notes (Signed)
ANTICOAGULATION CONSULT NOTE - Follow Up Consult  Pharmacy Consult for heparin Indication: chest pain/ACS and atrial fibrillation   Labs:  Recent Labs  10/29/14 0411 10/30/14 0500  10/30/14 1356 10/30/14 2132 10/31/14 0455  HGB 9.3* 9.0*  --   --   --  9.9*  HCT 30.0* 29.2*  --   --   --  31.4*  PLT 275 314  --   --   --  337  HEPARINUNFRC  --   --   < > 0.83* 0.94* 0.48  CREATININE 1.26  --   --   --   --   --   < > = values in this interval not displayed.    Assessment/Plan:  59yo male therapeutic on heparin after rate changes. Will continue gtt at current rate and confirm stable with additional level.   Wynona Neat, PharmD, BCPS  10/31/2014,5:54 AM

## 2014-10-31 NOTE — Progress Notes (Signed)
7026 Dr Daphene Calamity called back  Aware of HR . To give PRN order and to call him back HR stays above 125 M/hr

## 2014-10-31 NOTE — Evaluation (Signed)
Physical Therapy Evaluation Patient Details Name: [name] MRN: [id number] DOB: 23-Oct-1955 Today's Date: 10/31/2014   History of Present Illness  59 y.o. male admitted with Acute hypoxic respiratory failure, sepsis, NSTEMI. 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 Pt currently with functional limitations due to the deficits listed below (see PT Problem List). Patient quite lethargic today but willing to practice bed mobility and therapeutic exercises. Unwilling to transfer out of bed. Needed min assist to sit edge of bed. SpO2 was 79% when PT entered room as nasal cannula had partially come away from patient's face; quickly rose to >92% with 2L supplemental O2 applied. He reports that he does not have 24 hour supervision at home. Pt will benefit from skilled PT to increase their independence and safety with mobility to allow discharge to the venue listed below.       Follow Up Recommendations SNF;Supervision/Assistance - 24 hour    Equipment Recommendations  None recommended by PT    Recommendations for Other Services       Precautions / Restrictions Precautions Precautions: Fall Precaution Comments: monitor O2 sast Restrictions Weight Bearing Restrictions: No      Mobility  Bed Mobility Overal bed mobility: Needs Assistance Bed Mobility: Supine to Sit;Sit to Supine     Supine to sit: Min assist;HOB elevated Sit to supine: Min guard   General bed mobility comments: Min assist for truncal support to sit edge of bed from supine position with HOB elevated. VC for use of rail as needed. Pt able to scoot to edge once upright. Min guard with cues for technique to return to bed. Able to scoot  himself up in bed using rails.  Transfers                 General transfer comment: Pt refuses to stand at this time due to fatigue.  Ambulation/Gait                Stairs            Wheelchair Mobility    Modified Rankin (Stroke Patients Only)       Balance Overall balance assessment: Needs assistance Sitting-balance support: Single extremity supported Sitting balance-Leahy Scale: Poor                                       Pertinent Vitals/Pain Pain Assessment: 0-10 Pain Score: 9  Pain Location: abdomen Pain Intervention(s): Monitored during session;Repositioned    Home Living Family/patient expects to be discharged to:: Private residence Living Arrangements: Other relatives (sister) Available Help at Discharge: Family (States sister is not available 24/7) Type of Home: House Home Access: Stairs to enter Entrance Stairs-Rails: None Entrance Stairs-Number of Steps: 3 Home Layout: One level Home Equipment: Cane - single point;Bedside commode;Shower seat      Prior Function Level of Independence: Independent with assistive device(s)         Comments: Pt reports he uses a cane for mobility at home and can bath/dress himself     Hand Dominance   Dominant Hand: Right    Extremity/Trunk Assessment   Upper Extremity Assessment: Defer to OT evaluation  Lower Extremity Assessment: Generalized weakness         Communication   Communication: No difficulties  Cognition Arousal/Alertness: Lethargic Behavior During Therapy: Flat affect Overall Cognitive Status: History of cognitive impairments - at baseline                      General Comments General comments (skin integrity, edema, etc.): Patient's SpO2 was 79% when PT entered room; nasal canula was not completely on pts face. Adjusted n/c with 2L and SpO2 returned into low 90s within a few minutes.    Exercises General Exercises - Lower  Extremity Ankle Circles/Pumps: AROM;Both;20 reps;Supine Heel Slides: AAROM;Both;10 reps;Supine;Strengthening Hip ABduction/ADduction: AAROM;Both;Strengthening;10 reps;Supine      Assessment/Plan    PT Assessment Patient needs continued PT services  PT Diagnosis Difficulty walking;Generalized weakness;Acute pain   PT Problem List Decreased strength;Decreased range of motion;Decreased activity tolerance;Decreased balance;Decreased mobility;Decreased cognition;Decreased knowledge of use of DME;Decreased knowledge of precautions;Cardiopulmonary status limiting activity;Obesity;Pain  PT Treatment Interventions DME instruction;Gait training;Functional mobility training;Therapeutic activities;Therapeutic exercise;Balance training;Neuromuscular re-education;Patient/family education;Modalities;Cognitive remediation   PT Goals (Current goals can be found in the Care Plan section) Acute Rehab PT Goals Patient Stated Goal: none stated PT Goal Formulation: With patient Time For Goal Achievement: 11/14/14 Potential to Achieve Goals: Good    Frequency Min 3X/week   Barriers to discharge Decreased caregiver support Does not have 24 hour care    Co-evaluation               End of Session Equipment Utilized During Treatment: Oxygen Activity Tolerance: Patient limited by fatigue Patient left: in bed;with call bell/phone within reach Nurse Communication: Mobility status;Other (comment) (SpO2 79% on room air when PT entered room)         Time: 4801-6553 PT Time Calculation (min) (ACUTE ONLY): 21 min   Charges:   PT Evaluation $Initial PT Evaluation Tier I: 1 Procedure     PT G CodesEllouise Newer 10/31/2014, 4:27 PM Camille Bal [place], [place]

## 2014-10-31 NOTE — Progress Notes (Addendum)
Pt switched self  To afib 117 -148  Pt asleep arousable asymptomatic . Will notify the MD EKG done

## 2014-10-31 NOTE — Progress Notes (Signed)
Inpatient Diabetes Program Recommendations  AACE/ADA: New Consensus Statement on Inpatient Glycemic Control (2013)  Target Ranges:  Prepandial:   less than 140 mg/dL      Peak postprandial:   less than 180 mg/dL (1-2 hours)      Critically ill patients:  140 - 180 mg/dL   Results for [name], SLINGER (MRN [id number]) as of 10/31/2014 09:24  Ref. Range 10/30/2014 07:30 10/30/2014 11:52 10/30/2014 15:49 10/30/2014 19:05 10/30/2014 22:11 10/31/2014 06:04  Glucose-Capillary Latest Range: 70-99 mg/dL 398 (H) 409 (H) 329 (H) 235 (H) 258 (H) 262 (H)   Diabetes history: DM2 Outpatient Diabetes medications: Lantus 60 units QAM, Lantus 40 units QHS, Metformin 1000 mg BID Current orders for Inpatient glycemic control: Lantus 40 units QHS, Novolog 0-20 units TID with meals, Novolog 5 units TID with meals for meal coverage  Inpatient Diabetes Program Recommendations Insulin - Basal: Noted steroids were decreased to Solumedrol 40 mg daily. Fasting glucose 258 mg/dl this morning. Please consider increasing Lantus to 45 units QHS. Will likely need to titrate back down as steroids are weaned. Correction (SSI): CBG 258 mg/dl last night at bedtime but no insulin given since there is not any bedtime correction ordered. Please consider ordering Novolog bedtime correction scale. Insulin - Meal Coverage: Please consider increasing meal coverage to Novolog 8 units TID with meals.  Thanks, Barnie Alderman, RN, MSN, CCRN, CDE Diabetes Coordinator Inpatient Diabetes Program [phone] (Team Pager from [place] to [place]) [phone] ([place] office) [phone] [place] office)

## 2014-10-31 NOTE — Progress Notes (Signed)
Patient ID: [name], male   DOB: [birth year], 59 y.o.   MRN: [id number]  [facility] PROGRESS NOTE  [name]:[id number] DOB: Nov 07, 1955 DOA: 10/26/2014 PCP: Joseph Maroon, MD   Brief narrative:    59 y.o. 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 This has been associated with malaise, poor oral intake, subjective fevers, substernal area chest pain, non radiating.   In ED, pt noted to be hypoxic with oxygen saturation in mid 80's and requiring placement on BiPAP, initial temp (oral) 99.4 F but rectal temp 100.2 F. RR in 30's, BP 119/51. Blood work notable for WBC 14 K, K 5.5, Cr 1.37, troponin 0.12. Marland Kitchen ECG demonstrated NSR, RBBB, RAD, ST depression in leads V2-V6. Compared to 04/23/13, the anterolateral STD are new. CXR concerning for pulmonary vascular congestion and cardiology consulted. TR[facility] asked to admit to SDU for further evaluation.   Major events since admission:  3/20 - admit to SDU, concern ACS, d CHF, sepsis of unclear source   3/21 - persistent a-fib with RVR, pt now DNI  3/22 - troponins continue trending up, added solumedrol due to wheezing   3/23 - more chest pain this AM, pt apparently did not get any Lasix 3/22, restarted this AM 3/23  3/24 - no chest pain but with more abd pain, no BM for three days   3/25 - reports feeling better, stop Heparin drip and place back on home regime Pradaxa   Assessment/Plan:    Principal Problem:   Acute hypoxic respiratory failure -  appears to be multifactorial and setting of acute diastolic CHF and NSTEMI in pt with known COPD (yrs of smoking) - appreciate cardiology team assistance  - pt looks better this am and denies chest pain - weight is trending down since admission: 242 lbs --> 237 lbs this AM - continue Lasix 80 mg IV BID  - continue Metoprolol and atorvastatin (increased dose to 80 mg PO QD)  - stop Heparin drip today and start Pradaxa per home medical regimen    Sepsis, likely secondary to LLE cellulitis  - criteria for sepsis met: T 100.2 F, RR in 30's, WBC 14 K, lactic acid 2.36, multiple organ systems failure  - urine culture so far with no sign growth, blood cultures negative, LLE cellulitis much better - continue doxycycline until 3/29 - WBC is still elevated and likley from steroids rather that an infectious etiology    Acute on chronic diastolic CHF - management with Lasix as noted above, per cardiology - daily weights and strict I's and O's - weight on admission: 242 lbs --> 237 lbs this AM   Acute on chronic COPD - continue solumedrol and continue BD's scheduled and as needed  - continue tapering solumedrol, possible transition to Prednisone in AM if no further wheezing    NSTEMI - likely demand ischemia - management as noted above per cardiology  - stop Heparin drip and resume Pradaxa  - pt determined not to be a candidate for an intervention due to multiple comorbid  conditions    Paroxysmal atrial fibrillation, CHADS score 5 (CHF, HTN, DM, stroke) - continue Metoprolol and Cardizem    Constipation - has small BM this AM, will give tap water enema - ABX XRAY with no obstruction   Active Problems:   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 - pt has at least stage II CKD based on record reveiw with GFR in 70's with normal Cr but presence of multiple risk factors including HTN, HLD, DM - Cr is now WNL - holding Metformin    LLE cellulitis with diabetic ulcer  - wound care consulted - erythema much  improved  - ABX as noted above, doxycycline until 3/29   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, neuropathies  - A1C 7.9, pt on high dose insulin at home - holding Metformin for now and continue SSI and Lantus 40 U QHS   Hyperkalemia - WNL this AM   HLD - continue statin but increased the dose to 80 mg per cardiology team recommendations    Essential hypertension - reasonably stable    Ventral hernia - abd now with no erythema and non TTP - close monitoring - pt determined in the past to be poor surgical candidate    Iron deficiency anemia - no signs of active bleeding - repeat CBC in AM   Morbid obesity  - Body mass index is 47.28   Acute functional quadriplegia - OOB to chair   DVT prophylaxis - change from Heparin drip to Pradaxa 3/25  Code Status: Full.  Family Communication:  No family at bedside  Disposition Plan: possible d/c in 24 - 48 hours   IV access:  Peripheral IV  Procedures and diagnostic studies:    CXR 10/26/2014   Prominent vasculature and interstitium, likely mild congestive heart failure.    Dg Chest Port 1 View  10/29/2014 Persistent interstitial thickening and mild cardiomegaly with a small new area of airspace opacity above the right minor fissure, likely airspace edema. Findings consistent with slightly worsened congestive heart failure. PICC tip in these mid superior vena cava.   Dg Abd Portable 2v  10/30/2014   Normal bowel gas pattern, no free air. Suspect persistent large ventral abdominal hernia.    Medical Consultants:  Cardiology  PCCM - signed off  Other Consultants:  None  IAnti-Infectives/microbiology data:   Vancomycin 3/20 --> 3/24 Aztreonam 3/20 --> 3/24 Doxycycline 3/24 --> 3/29  Blood Cultures 3/20 --> Urine cultures 3/20 -->   Joseph Ramsay, MD  [facility] Pager [phone]  If 7PM-7AM, please contact night-coverage [email or web address] Password [email or web address] 10/31/2014, 1:06 PM   LOS: 5 days   HPI/Subjective: No events  overnight.   Objective: Filed Vitals:   10/31/14 0400 10/31/14 0700 10/31/14 0831 10/31/14 1125  BP: 157/75   171/83  Pulse: 66     Temp: 98.4 F (36.9 C)     TempSrc: Oral     Resp: 20     Height:      Weight:  107.684 kg (237 lb 6.4 oz)    SpO2: 98%  96% 100%    Intake/Output Summary (Last 24 hours) at 10/31/14 1306 Last data filed at 10/31/14 0900  Gross per 24 hour  Intake  724.6 ml  Output   1876 ml  Net -1151.4 ml    Exam:   General:  Pt is alert, follows commands appropriately, in mild distress due to dyspnea and being on BiPAP  Cardiovascular: Irregular rate and rhythm, no rubs, no gallops  Respiratory: Rhonchi at bases with mild exp wheezing   Abdomen: large ventral hernia, ant abrasion, no more erythema, abd slightly hard to touch   Extremities: +1 B LE edema, right mid foot amputation, left LE with minimal erythema, small diabetic foot ulcer   Data Reviewed: Basic Metabolic Panel:  Recent Labs Lab 10/26/14 1033 10/27/14 0357 10/28/14 0228 10/29/14 0411 10/31/14 0455  NA 139 141 137 133* 132*  K 5.1 4.4 4.3 4.6 4.4  CL 100 104 98 98 98  CO2 _0 GLUCOSE 195* 185* 147* 237* 280*  BUN 26* 29* 39* 47* 72*  CREATININE 1.31 1.17 1.31 1.26 1.19  CALCIUM 8.9 8.6 8.4 8.4 8.3*   Liver Function Tests:  Recent Labs Lab 10/26/14 0329  AST 21  ALT 17  ALKPHOS 70  BILITOT 0.5  PROT 6.4  ALBUMIN 3.0*    Recent Labs Lab 10/26/14 0329  LIPASE 17   CBC:  Recent Labs Lab 10/26/14 0329 10/26/14 1033 10/27/14 0357 10/28/14 0228 10/29/14 0411 10/30/14 0500 10/31/14 0455  WBC 14.0* 12.6* 12.5* 10.1 7.2 12.5* 16.1*  NEUTROABS 10.8* 9.5*  --   --   --   --   --   HGB 10.3* 10.6* 10.3* 8.7* 9.3* 9.0* 9.9*  HCT 33.3* 35.3* 34.4* 28.4* 30.0* 29.2* 31.4*  MCV 82.4 83.6 82.1 81.8 80.4 80.4 78.5  PLT 283 271 278 257 275 314 337   Cardiac Enzymes:  Recent Labs Lab 10/27/14 0357 10/27/14 1445 10/27/14 1957 10/27/14 2350  10/28/14 0228  TROPONINI 3.44* 3.41* 5.19* 4.90* 6.47*   CBG:  Recent Labs Lab 10/30/14 1549 10/30/14 1905 10/30/14 2211 10/31/14 0604 10/31/14 1204  GLUCAP 329* 235* 258* 262* 189*    Scheduled Meds: . antiseptic oral rinse  7 mL Mouth Rinse BID  . aspirin  81 mg Oral Daily  . atorvastatin  80 mg Oral q1800  . bisacodyl  10 mg Rectal BID  . dabigatran  150 mg Oral Q12H  . doxycycline  100 mg Oral Q12H  . ferrous gluconate  324 mg Oral BID WC  . furosemide  80 mg Intravenous BID  . gabapentin  300 mg Oral BID  . insulin aspart  0-20 Units Subcutaneous TID WC  . insulin aspart  5 Units Subcutaneous TID WC  . insulin glargine  40 Units Subcutaneous QHS  . isosorbide mononitrate  15 mg Oral Daily  . levalbuterol  1.25 mg Nebulization TID  . methylPREDNISolone (SOLU-MEDROL) injection  40 mg Intravenous Daily  . metoprolol tartrate  50 mg Oral BID  . pantoprazole  40 mg Oral Daily  . PARoxetine  40 mg Oral BH-q7a  . sennosides  5 mL Oral BID  . sodium chloride  10-40 mL Intracatheter Q12H  . sodium chloride  3 mL Intravenous Q12H   Continuous Infusions:

## 2014-10-31 NOTE — Progress Notes (Signed)
ANTICOAGULATION CONSULT NOTE - Follow Up Consult  Pharmacy Consult for Heparin ->Pradaxa Indication: afib  Allergies  Allergen Reactions  . Penicillins Hives and Nausea And Vomiting    Patient Measurements: Height: 5' (152.4 cm) Weight: 237 lb 6.4 oz (107.684 kg) IBW/kg (Calculated) : 50  Vital Signs: Temp: 98.4 F (36.9 C) (03/25 0400) Temp Source: Oral (03/25 0400) BP: 171/83 mmHg (03/25 1125) Pulse Rate: 66 (03/25 0400)  Labs:  Recent Labs  10/29/14 0411 10/30/14 0500  10/30/14 2132 10/31/14 0455 10/31/14 1104  HGB 9.3* 9.0*  --   --  9.9*  --   HCT 30.0* 29.2*  --   --  31.4*  --   PLT 275 314  --   --  337  --   HEPARINUNFRC  --   --   < > 0.94* 0.48 0.62  CREATININE 1.26  --   --   --  1.19  --   < > = values in this interval not displayed.  Estimated Creatinine Clearance: 68.3 mL/min (by C-G formula based on Cr of 1.19).  Assessment: MD d/c heparin and consult pharmacy to restart home Pradaxa for Afib. CHADS2VASC score 5. SCr stable, est CrCl 65 ml/min. Dose appropriate. CBC stable. No bleeding noted.    Goal of Therapy:  Prevention of stroke Monitor platelets by anticoagulation protocol: Yes     Plan:  Pradaxa 150mg  po BID F/u renal function, s/s bleeding  Sherlon Handing, PharmD, BCPS Clinical pharmacist, pager [phone] 10/31/2014,1:00 PM

## 2014-11-01 LAB — BASIC METABOLIC PANEL
Anion gap: 7 (ref 5–15)
BUN: 66 mg/dL — ABNORMAL HIGH (ref 6–23)
CALCIUM: 9.1 mg/dL (ref 8.4–10.5)
CHLORIDE: 103 mmol/L (ref 96–112)
CO2: 30 mmol/L (ref 19–32)
Creatinine, Ser: 1.08 mg/dL (ref 0.50–1.35)
GFR calc Af Amer: 84 mL/min — ABNORMAL LOW (ref >90)
GFR calc non Af Amer: 73 mL/min — ABNORMAL LOW (ref >90)
Glucose, Bld: 169 mg/dL — ABNORMAL HIGH (ref 70–99)
Potassium: 4.5 mmol/L (ref 3.5–5.1)
Sodium: 140 mmol/L (ref 135–145)

## 2014-11-01 LAB — CBC
HCT: 33.6 % — ABNORMAL LOW (ref 39.0–52.0)
HEMOGLOBIN: 10.6 g/dL — AB (ref 13.0–17.0)
MCH: 25.6 pg — ABNORMAL LOW (ref 26.0–34.0)
MCHC: 31.5 g/dL (ref 30.0–36.0)
MCV: 81.2 fL (ref 78.0–100.0)
Platelets: 324 10*3/uL (ref 150–400)
RBC: 4.14 MIL/uL — AB (ref 4.22–5.81)
RDW: 15.2 % (ref 11.5–15.5)
WBC: 15.8 10*3/uL — ABNORMAL HIGH (ref 4.0–10.5)

## 2014-11-01 LAB — GLUCOSE, CAPILLARY
GLUCOSE-CAPILLARY: 169 mg/dL — AB (ref 70–99)
Glucose-Capillary: 117 mg/dL — ABNORMAL HIGH (ref 70–99)
Glucose-Capillary: 155 mg/dL — ABNORMAL HIGH (ref 70–99)
Glucose-Capillary: 253 mg/dL — ABNORMAL HIGH (ref 70–99)

## 2014-11-01 LAB — CULTURE, BLOOD (ROUTINE X 2)
Culture: NO GROWTH
Culture: NO GROWTH

## 2014-11-01 LAB — MAGNESIUM: MAGNESIUM: 2.5 mg/dL (ref 1.5–2.5)

## 2014-11-01 MED ORDER — DILTIAZEM HCL 60 MG PO TABS
60.0000 mg | ORAL_TABLET | Freq: Four times a day (QID) | ORAL | Status: DC
  Administered 2014-11-01 – 2014-11-02 (×4): 60 mg via ORAL
  Filled 2014-11-01 (×9): qty 1

## 2014-11-01 MED ORDER — PREDNISONE 50 MG PO TABS
50.0000 mg | ORAL_TABLET | Freq: Every day | ORAL | Status: DC
  Administered 2014-11-02 – 2014-11-03 (×2): 50 mg via ORAL
  Filled 2014-11-01 (×3): qty 1

## 2014-11-01 MED ORDER — INSULIN ASPART 100 UNIT/ML ~~LOC~~ SOLN
5.0000 [IU] | Freq: Three times a day (TID) | SUBCUTANEOUS | Status: DC
  Administered 2014-11-01 – 2014-11-03 (×5): 5 [IU] via SUBCUTANEOUS

## 2014-11-01 MED ORDER — SODIUM CHLORIDE 0.9 % IJ SOLN
10.0000 mL | INTRAMUSCULAR | Status: DC | PRN
  Administered 2014-11-01 – 2014-11-02 (×2): 30 mL
  Administered 2014-11-03: 40 mL
  Filled 2014-11-01 (×3): qty 40

## 2014-11-01 NOTE — Progress Notes (Signed)
[facility] PROGRESS NOTE  [name] ZWC:[id number] DOB: 06-28-1956 DOA: 10/26/2014 PCP: [name], MD  Brief Summary  59 y.o. 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 This was associated with malaise, poor oral intake, subjective fevers, substernal area chest pain, non radiating.   In ED, pt noted to be hypoxic with oxygen saturation in mid 80's and requiring placement on BiPAP, initial temp (oral) 99.4 F but rectal temp 100.2 F. RR in 30's, BP 119/51. Blood work notable for WBC 14 K, K 5.5, Cr 1.37, troponin 0.12. Marland Kitchen ECG demonstrated NSR, RBBB, RAD, ST depression in leads V2-V6. Compared to 04/23/13, the anterolateral STD are new. CXR was concerning for pulmonary vascular congestion and cardiology consulted.  Major events since admission: 3/20 - admit to SDU, concern ACS, d CHF, sepsis of unclear source  3/21 - persistent a-fib with RVR, pt now DNI 3/22 - troponins continue trending up, added solumedrol due to wheezing  3/23 - more chest pain this AM, pt apparently did not get any Lasix 3/22, restarted this AM 3/23 3/24 - no chest pain but with more abd pain, no BM for three days  3/25 - reports feeling better, stop Heparin drip and place back on home regime Pradaxa   Assessment/Plan  Sepsis, likely secondary to LLE cellulitis from diabetic foot ulcer - criteria for sepsis met: T 100.2 F, RR in 30's, WBC 14 K, lactic acid 2.36, multiple organ systems failure  - urine culture so  far with no sign growth, blood cultures negative, LLE cellulitis much better - continue doxycycline until 3/29 - WBC trending down and erythema much improved - appreciate wound care assistnace  Acute hypoxic respiratory failuresecondary to acute on chronic diastolic CHF, resolving - appreciate cardiology team assistance  - weight is trending down since admission: 242 lbs --> 236 lbs  -  -2.4L yesterday - continue Lasix 80 mg IV BID   Acute on chronic COPD - start prednisone tomorrow  NSTEMI, likely demand ischemia - management as noted above per cardiology  - Resume Pradaxa  - pt determined not to be a candidate for an intervention due to multiple comorbid conditions  - continue Metoprolol and atorvastatin (increased dose to 80 mg PO QD)   Paroxysmal atrial fibrillation, CHADS score 5 (CHF, HTN, DM, stroke), episode of a-fib with RVR overnight - continue Metoprolol -  Required cardizem gtt twice overnight -  Start cardizem 46m q6h for now -  If BP and HR okay, resume 24h 2443mdose tomorrow  Constipation, resolved with enema - has small BM this AM, will give tap water enema - ABX XRAY with no obstruction    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, given GFR + proteinuria -  Minimize nephrotoxins and renally dose medication   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, neuropathies  - A1C 7.9, pt on high dose insulin at home - holding Metformin for now and continue SSI and Lantus 40 U QHS   Hyperkalemia - WNL this AM   HLD - continue  statin but increased the dose to 80 mg per cardiology team recommendations    Essential hypertension, stable -  Continue BB -  Adding CCB   Ventral hernia - abd now with no erythema and non TTP - close monitoring - pt determined in the past to be poor surgical candidate    Iron deficiency anemia, hemoglobin stable - continue ferrous sulfate   Morbid obesity  - Body mass index is 47.28   Acute functional quadriplegia -  OOB to chair   Diet:  Diabetic/healthy heart Access:  PICC IVF:  none Proph:  pradaxa  Code Status: partial  Family Communication: patient alone Disposition Plan:  PT/OT recommending SNF   Consultants:  PCCM  Cardiology  Procedures:  CXR/KUB  Antibiotics: Vancomycin 3/20 --> 3/24 Aztreonam 3/20 --> 3/24 Doxycycline 3/24 --> 3/29  Blood Cultures 3/20 -->NGF Urine cultures 3/20 --> insignificant growth, final  HPI/Subjective:  Denies SOB, chest pain, nausea.  States he may have been incontinent of stool.  Feels better overall.    Objective: Filed Vitals:   11/01/14 0052 11/01/14 0500 11/01/14 0937 11/01/14 1442  BP: 126/73 122/78  125/63  Pulse: 97 89  57  Temp: 98.6 F (37 C) 98.4 F (36.9 C)  98.6 F (37 C)  TempSrc: Oral Oral  Oral  Resp: _0 Height:      Weight:  107.342 kg (236 lb 10.3 oz)    SpO2: 86% 92% 93% 98%    Intake/Output Summary (Last 24 hours) at 11/01/14 1515 Last data filed at 11/01/14 1400  Gross per 24 hour  Intake 967.67 ml  Output   2975 ml  Net -2007.33 ml   Filed Weights   10/29/14 0356 10/31/14 0700 11/01/14 0500  Weight: 106.9 kg (235 lb 10.8 oz) 107.684 kg (237 lb 6.4 oz) 107.342 kg (236 lb 10.3 oz)    Exam:   General:  Obese adult male, No acute distress  HEENT:  NCAT, MMM  Cardiovascular:  RRR, nl S1, S2 no mrg, 2+ pulses, warm extremities  Respiratory:  Rales and course bilateral BS, no wheezes or rhonchi, no increased WOB  Abdomen:   NABS, soft, moderately distended, nontender.  Very large ventral hernia  MSK:   Decreased tone and bulk, trace bilateral pitting LEE.  Left heel bandaged, mildly warm but not erythematous.  Right foot forefoot amputated  Neuro:  Grossly intact  Data Reviewed: Basic Metabolic Panel:  Recent Labs Lab 10/27/14 0357 10/28/14 0228 10/29/14 0411 10/31/14 0455 11/01/14 1045  NA 141 137 133* 132* 140  K 4.4 4.3 4.6 4.4 4.5  CL 104 98 98 98 103  CO2 _1 GLUCOSE 185* 147* 237* 280* 169*  BUN 29* 39* 47* 72* 66*  CREATININE 1.17 1.31 1.26 1.19 1.08  CALCIUM 8.6 8.4 8.4 8.3* 9.1  MG  --   --   --   --  2.5   Liver Function Tests:  Recent Labs Lab 10/26/14 0329  AST 21  ALT 17  ALKPHOS 70  BILITOT 0.5  PROT 6.4  ALBUMIN 3.0*    Recent Labs Lab 10/26/14 0329  LIPASE 17   No results for input(s): AMMONIA in the last 168 hours. CBC:  Recent Labs Lab 10/26/14 0329 10/26/14 1033  10/28/14 0228 10/29/14 0411 10/30/14 0500 10/31/14 0455 11/01/14 1045  WBC 14.0* 12.6*  < > 10.1 7.2 12.5* 16.1* 15.8*  NEUTROABS 10.8* 9.5*  --   --   --   --   --   --  HGB 10.3* 10.6*  < > 8.7* 9.3* 9.0* 9.9* 10.6*  HCT 33.3* 35.3*  < > 28.4* 30.0* 29.2* 31.4* 33.6*  MCV 82.4 83.6  < > 81.8 80.4 80.4 78.5 81.2  PLT 283 271  < > 257 275 314 337 324  < > = values in this interval not displayed. Cardiac Enzymes:  Recent Labs Lab 10/27/14 0357 10/27/14 1445 10/27/14 1957 10/27/14 2350 10/28/14 0228  TROPONINI 3.44* 3.41* 5.19* 4.90* 6.47*   BNP (last 3 results)  Recent Labs  10/26/14 0329  BNP 345.2*    ProBNP (last 3 results) No results for input(s): PROBNP in the last 8760 hours.  CBG:  Recent Labs Lab 10/31/14 1204 10/31/14 1710 10/31/14 2211 11/01/14 0644 11/01/14 1147  GLUCAP 189* 159* 147* 117* 155*    Recent Results (from the past 240 hour(s))  Blood culture (routine x 2)     Status: None   Collection Time: 10/26/14  3:45 AM  Result Value Ref Range Status   Specimen Description BLOOD LEFT ARM  Final   Special Requests BOTTLES DRAWN AEROBIC AND ANAEROBIC 10CC EA  Final   Culture   Final    NO GROWTH 5 DAYS Note: Culture results may be compromised due to an excessive volume of blood received in culture bottles. Performed at [facility]    Report Status 11/01/2014 FINAL  Final  Blood culture (routine x 2)     Status: None   Collection Time: 10/26/14  3:45 AM  Result Value Ref Range Status    Specimen Description BLOOD RIGHT ARM  Final   Special Requests BOTTLES DRAWN AEROBIC ONLY [place]  Final   Culture   Final    NO GROWTH 5 DAYS Note: Culture results may be compromised due to an excessive volume of blood received in culture bottles. Performed at [facility]    Report Status 11/01/2014 FINAL  Final  Culture, Urine     Status: None   Collection Time: 10/26/14  2:21 PM  Result Value Ref Range Status   Specimen Description URINE, RANDOM  Final   Special Requests NONE  Final   Colony Count   Final    8,000 COLONIES/ML Performed at [facility]    Culture   Final    INSIGNIFICANT GROWTH Performed at [facility]    Report Status 10/27/2014 FINAL  Final  MRSA PCR Screening     Status: None   Collection Time: 10/26/14  3:46 PM  Result Value Ref Range Status   MRSA by PCR NEGATIVE NEGATIVE Final    Comment:        The GeneXpert MRSA Assay (FDA approved for NASAL specimens only), is one component of a comprehensive MRSA colonization surveillance program. It is not intended to diagnose MRSA infection nor to guide or monitor treatment for MRSA infections.      Studies: Dg Abd Portable 2v  10/30/2014   CLINICAL DATA:  59 year old male with abdominal pain and distension for 5 days. Initial encounter.  EXAM: PORTABLE ABDOMEN - 2 VIEW  COMPARISON:  CT Abdomen and Pelvis 09/08/2010.  FINDINGS: Supine AP portable and left side down portable lateral decubitus views of the abdomen. Mild motion artifact. Large body habitus. No pneumoperitoneum identified. Non obstructed bowel gas pattern. There is some oral contrast visible. The previously seen large ventral abdominal hernia probably persists and may explain the mild differential density of bowel in the central lower abdomen. Scoliosis and degenerative changes in the lumbar spine.  IMPRESSION: Normal bowel gas pattern, no free air. Suspect persistent large ventral abdominal hernia.   Electronically Signed    By: Genevie Ann M.D.   On: 10/30/2014 16:25    Scheduled Meds: . antiseptic oral rinse  7 mL Mouth Rinse BID  . aspirin  81 mg Oral Daily  . atorvastatin  80 mg Oral q1800  . bisacodyl  10 mg Rectal BID  . dabigatran  150 mg Oral Q12H  . diltiazem  60 mg Oral 4 times per day  . doxycycline  100 mg Oral Q12H  . ferrous gluconate  324 mg Oral BID WC  . furosemide  80 mg Intravenous BID  . gabapentin  300 mg Oral BID  . insulin aspart  0-20 Units Subcutaneous TID WC  . insulin aspart  5 Units Subcutaneous TID WC  . insulin glargine  40 Units Subcutaneous QHS  . isosorbide mononitrate  15 mg Oral Daily  . levalbuterol  1.25 mg Nebulization TID  . methylPREDNISolone (SOLU-MEDROL) injection  40 mg Intravenous Daily  . metoprolol tartrate  50 mg Oral BID  . pantoprazole  40 mg Oral Daily  . PARoxetine  40 mg Oral BH-q7a  . sennosides  5 mL Oral BID  . sodium chloride  10-40 mL Intracatheter Q12H  . sodium chloride  3 mL Intravenous Q12H   Continuous Infusions:   Principal Problem:   Acute diastolic CHF (congestive heart failure) Active Problems:   DM (diabetes mellitus), type 2, uncontrolled   HYPERCHOLESTEROLEMIA   Essential hypertension   Atrial fibrillation   Ventral hernia   Hyperkalemia   Iron deficiency anemia   Abrasion of abdominal wall   Abdominal hernia   Elevated troponin   Acute respiratory failure with hypoxia   Acute on chronic respiratory failure    Time spent: 30 min    , [facility] Pager [phone]. If 7PM-7AM, please contact night-coverage at [email or web address], password [facility] 11/01/2014, 3:15 PM  LOS: 6 days

## 2014-11-01 NOTE — Progress Notes (Signed)
Patient respiratory reassessment done and patient scored a 7. Patient seems to have SOB with no relation to bronchospasm but to CHF issues and fluid. Changed to only PRN levalbuterol and will reassess per protocol and PRN for any issues or changes to status. Patient states he doesn't use inhalers or treatments at home.

## 2014-11-01 NOTE — Progress Notes (Signed)
At change of shift patient remained Afib w/RVR. PRN Metoprolol given at 1923. Patient remained Afib RVR, MD notified. Received order to start Cardizem gtt, which was started at 41ml/hr at approximately 2100. Shortly thereafter, patient converted to NSR, rate in the 70's.  MD on call notified, received verbal to keep Cardizem gtt going at this time.  Approximately 2315, Cardizem gtt was stopped and MD was notified again because patient's heart rate was sustaining at 60 with occasional drops into the high 50's.  MD agreed to discontinue gtt.  Approximately 0040, patient once again converted to Afib RVR, rate anywhere from 110's to 140's.  MD on call notified and PRN Metoprolol given.  Patient has remained asymptomatic throughout and consistently denied any chest pain, palpitations, etc.  Patient is currently resting comfortably, will continue to monitor.  Tresa Endo

## 2014-11-01 NOTE — Progress Notes (Signed)
Patient converted back to NSR, 60's. Will continue to monitor. Tresa Endo

## 2014-11-02 LAB — GLUCOSE, CAPILLARY
GLUCOSE-CAPILLARY: 138 mg/dL — AB (ref 70–99)
GLUCOSE-CAPILLARY: 234 mg/dL — AB (ref 70–99)
Glucose-Capillary: 183 mg/dL — ABNORMAL HIGH (ref 70–99)
Glucose-Capillary: 173 mg/dL — ABNORMAL HIGH (ref 70–99)

## 2014-11-02 LAB — BASIC METABOLIC PANEL
Anion gap: 4 — ABNORMAL LOW (ref 5–15)
BUN: 64 mg/dL — ABNORMAL HIGH (ref 6–23)
CALCIUM: 9 mg/dL (ref 8.4–10.5)
CO2: 32 mmol/L (ref 19–32)
Chloride: 101 mmol/L (ref 96–112)
Creatinine, Ser: 0.99 mg/dL (ref 0.50–1.35)
GFR calc Af Amer: >90 mL/min (ref >90)
GFR, EST NON AFRICAN AMERICAN: 87 mL/min — AB (ref >90)
Glucose, Bld: 171 mg/dL — ABNORMAL HIGH (ref 70–99)
Potassium: 4.2 mmol/L (ref 3.5–5.1)
Sodium: 137 mmol/L (ref 135–145)

## 2014-11-02 LAB — CLOSTRIDIUM DIFFICILE BY PCR: CDIFFPCR: NEGATIVE

## 2014-11-02 MED ORDER — SENNOSIDES-DOCUSATE SODIUM 8.6-50 MG PO TABS
2.0000 | ORAL_TABLET | Freq: Every evening | ORAL | Status: DC | PRN
  Filled 2014-11-02: qty 2

## 2014-11-02 MED ORDER — SACCHAROMYCES BOULARDII 250 MG PO CAPS
250.0000 mg | ORAL_CAPSULE | Freq: Two times a day (BID) | ORAL | Status: DC
  Administered 2014-11-02 – 2014-11-03 (×3): 250 mg via ORAL
  Filled 2014-11-02 (×4): qty 1

## 2014-11-02 MED ORDER — DILTIAZEM HCL ER COATED BEADS 180 MG PO CP24
180.0000 mg | ORAL_CAPSULE | Freq: Every day | ORAL | Status: DC
  Administered 2014-11-02 – 2014-11-03 (×2): 180 mg via ORAL
  Filled 2014-11-02 (×2): qty 1

## 2014-11-02 MED ORDER — ALUM & MAG HYDROXIDE-SIMETH 200-200-20 MG/5ML PO SUSP
30.0000 mL | Freq: Four times a day (QID) | ORAL | Status: DC | PRN
  Administered 2014-11-02 – 2014-11-03 (×4): 30 mL via ORAL
  Filled 2014-11-02 (×4): qty 30

## 2014-11-02 NOTE — Progress Notes (Signed)
The patient had multiple small bowel movements overnight.  He complained of indigestion once and received PRN Maalox with relief.  His VS are stable, but his HR did remain in the 50s most of the night.  When he is moving around, his HR is in the 60s.

## 2014-11-02 NOTE — Clinical Social Work Psychosocial (Signed)
Clinical Social Work Department BRIEF PSYCHOSOCIAL ASSESSMENT 11/02/2014  Patient:  [name],[name]     Account Number:  [id number]     Admit date:  10/26/2014  Clinical Social Worker:  , BRYANT, LCSWA  Date/Time:  11/02/2014 11:50 AM  Referred by:  Physician  Date Referred:  11/02/2014 Referred for  SNF Placement   Other Referral:   NA   Interview type:  Patient Other interview type:   Patient alert and oriented at time of assessment.    PSYCHOSOCIAL DATA Living Status:  FAMILY Admitted from facility:   Level of care:   Primary support name:  Linda Autwell Primary support relationship to patient:  SIBLING Degree of support available:   Support is good.    CURRENT CONCERNS Current Concerns  Post-Acute Placement   Other Concerns:   NA    SOCIAL WORK ASSESSMENT / PLAN CSW met with patient at bedside to complete assessment. Patient presents with flat affect and overall appears tired. Patient is minimally engaged in assessment and does offer much information in responses. CSW explained reason for visit (SNF placement). Patient agreeable to SNF placement but states, "I'll only go if it's for a short period of time." Patient is agreeable to go for a "week or two." CSW explained SNF search/placement process and answered patient's questions. He reports that his main support is his sister who he lives with. Patient appears to have fair insight into the reason for his hospitalization. CSW will follow up with bed offers once available.   Assessment/plan status:  Psychosocial Support/Ongoing Assessment of Needs Other assessment/ plan:   Complete Fl2, Fax, PASRR   Information/referral to community resources:   CSW contact information and SNF list given.    PATIENT'S/FAMILY'S RESPONSE TO PLAN OF CARE: Patient plans to Dc to SNF once medically stable. Patient will return home with the help of his sister once able. CSW will assist as appropriate.       Bryant   MSW, LCSWA, LCASA, [phone] 

## 2014-11-02 NOTE — Progress Notes (Signed)
[facility] PROGRESS NOTE  [name] WUJ:[id number] DOB: 1955/10/15 DOA: 10/26/2014 PCP: [name], MD  Brief Summary  59 y.o. 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 This was associated with malaise, poor oral intake, subjective fevers, substernal area chest pain, non radiating.   In ED, pt noted to be hypoxic with oxygen saturation in mid 80's and requiring placement on BiPAP, initial temp (oral) 99.4 F but rectal temp 100.2 F. RR in 30's, BP 119/51. Blood work notable for WBC 14 K, K 5.5, Cr 1.37, troponin 0.12. Marland Kitchen ECG demonstrated NSR, RBBB, RAD, ST depression in leads V2-V6. Compared to 04/23/13, the anterolateral STD are new. CXR was concerning for pulmonary vascular congestion and cardiology consulted.  Major events since admission: 3/20 - admit to SDU, concern ACS, d CHF, sepsis of unclear source  3/21 - persistent a-fib with RVR, pt now DNI 3/22 - troponins continue trending up, added solumedrol due to wheezing  3/23 - more chest pain this AM, pt apparently did not get any Lasix 3/22, restarted this AM 3/23 3/24 - no chest pain but with more abd pain, no BM for three days  3/25 - reports feeling better, stop Heparin drip and place back on home regime Pradaxa   Assessment/Plan  Sepsis, likely secondary to LLE cellulitis from diabetic foot ulcer - criteria for sepsis met: T 100.2 F, RR in 30's, WBC 14 K, lactic acid 2.36, multiple organ systems failure  - urine culture so  far with no sign growth, blood cultures negative, LLE cellulitis much better - continue doxycycline until 3/29 - WBC trending down and erythema much improved - appreciate wound care assistnace  Acute hypoxic respiratory failuresecondary to acute on chronic diastolic CHF, resolving - appreciate cardiology team assistance  - weight is trending down since admission: 242 lbs --> 236 lbs  -  -1.4L yesterday - continue Lasix 80 mg IV BID   Acute on chronic COPD - start prednisone tomorrow  NSTEMI, likely demand ischemia - management per cardiology  - Resumed Pradaxa  - pt determined not to be a candidate for an intervention due to multiple comorbid conditions  - continue Metoprolol and high dose statin  Paroxysmal atrial fibrillation, CHADS score 5 (CHF, HTN, DM, stroke), episode of a-fib with RVR on 3/26 -  Tele:  Sinus brady - continued Metoprolol -  D/c cardizem 72m q6h  -  Start dilt 24h 189m(slghtly lower dose due to bradycardia)  Constipation, resolved with enema, now with diarrhea -  D/c senna and bisacodyl -  C. 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, neuropathies  - A1C 7.9, pt on high dose insulin at home - holding Metformin for now and continue SSI and Lantus 40 U QHS   Hyperkalemia - WNL this AM   HLD - continue statin but increased the dose to 80 mg per cardiology team recommendations  Essential hypertension, stable -  Continue BB -  Adding CCB   Ventral hernia - abd now with no erythema and non TTP - close monitoring - pt determined in the past to be poor surgical candidate    Iron deficiency anemia, hemoglobin stable - continue ferrous sulfate   Morbid obesity  - Body mass index is 47.28   Acute functional quadriplegia - OOB to chair   Diet:  Diabetic/healthy heart Access:  PICC IVF:   none Proph:  pradaxa  Code Status: partial  Family Communication: patient alone Disposition Plan:  PT/OT recommending SNF   Consultants:  PCCM  Cardiology  Procedures:  CXR/KUB  Antibiotics: Vancomycin 3/20 --> 3/24 Aztreonam 3/20 --> 3/24 Doxycycline 3/24 --> 3/29  Blood Cultures 3/20 -->NGF Urine cultures 3/20 --> insignificant growth, final  HPI/Subjective:  Complains of indigestion and nausea.  Still having watery stools  Objective: Filed Vitals:   11/01/14 1545 11/01/14 2100 11/02/14 0453 11/02/14 0500  BP:  131/63 156/63   Pulse:  58 53   Temp:  98.1 F (36.7 C) 97.7 F (36.5 C)   TempSrc:  Oral Oral   Resp:  18 18   Height:      Weight:    104.146 kg (229 lb 9.6 oz)  SpO2: 95% 100% 100%     Intake/Output Summary (Last 24 hours) at 11/02/14 1352 Last data filed at 11/02/14 0512  Gross per 24 hour  Intake    800 ml  Output   2300 ml  Net  -1500 ml   Filed Weights   10/31/14 0700 11/01/14 0500 11/02/14 0500  Weight: 107.684 kg (237 lb 6.4 oz) 107.342 kg (236 lb 10.3 oz) 104.146 kg (229 lb 9.6 oz)    Exam:   General:  Obese adult male, No acute distress, perioral pallor  HEENT:  NCAT, MMM  Cardiovascular:  RRR, nl S1, S2 no mrg, 2+ pulses, warm extremities  Respiratory:  Rales and course bilateral BS, no wheezes or rhonchi, no increased WOB  Abdomen:   NABS, soft, moderately distended, nontender.  Very large ventral hernia  MSK:   Decreased tone and bulk, trace bilateral pitting left LEE.  Left heel bandaged, mildly warm but not erythematous.  Right foot forefoot amputated  Neuro:  Grossly intact  Data Reviewed: Basic Metabolic Panel:  Recent Labs Lab 10/28/14 0228 10/29/14 0411 10/31/14 0455 11/01/14 1045 11/02/14 0415  NA 137 133* 132* 140 137  K 4.3 4.6 4.4 4.5 4.2  CL 98 98 98 103 101  CO2 _0 32  GLUCOSE 147* 237* 280* 169* 171*  BUN 39* 47* 72* 66* 64*  CREATININE 1.31 1.26 1.19 1.08 0.99  CALCIUM 8.4 8.4 8.3*  9.1 9.0  MG  --   --   --  2.5  --    Liver Function Tests: No results for input(s): AST, ALT, ALKPHOS, BILITOT, PROT, ALBUMIN in the last 168 hours. No results for input(s): LIPASE, AMYLASE in the last 168 hours. No results for input(s): AMMONIA in the last 168 hours. CBC:  Recent Labs Lab 10/28/14 0228 10/29/14 0411 10/30/14 0500 10/31/14 0455 11/01/14 1045  WBC 10.1 7.2 12.5* 16.1* 15.8*  HGB 8.7* 9.3* 9.0* 9.9* 10.6*  HCT 28.4* 30.0* 29.2* 31.4* 33.6*  MCV 81.8 80.4 80.4 78.5 81.2  PLT 257 275 314 337 324   Cardiac Enzymes:  Recent Labs Lab 10/27/14 0357 10/27/14 1445 10/27/14 1957 10/27/14 2350 10/28/14 0228  TROPONINI 3.44* 3.41* 5.19* 4.90* 6.47*  BNP (last 3 results)  Recent Labs  10/26/14 0329  BNP 345.2*    ProBNP (last 3 results) No results for input(s): PROBNP in the last 8760 hours.  CBG:  Recent Labs Lab 11/01/14 1147 11/01/14 1647 11/01/14 2301 11/02/14 0636 11/02/14 1152  GLUCAP 155* 253* 169* 138* 183*    Recent Results (from the past 240 hour(s))  Blood culture (routine x 2)     Status: None   Collection Time: 10/26/14  3:45 AM  Result Value Ref Range Status   Specimen Description BLOOD LEFT ARM  Final   Special Requests BOTTLES DRAWN AEROBIC AND ANAEROBIC 10CC EA  Final   Culture   Final    NO GROWTH 5 DAYS Note: Culture results may be compromised due to an excessive volume of blood received in culture bottles. Performed at [facility]    Report Status 11/01/2014 FINAL  Final  Blood culture (routine x 2)     Status: None   Collection Time: 10/26/14  3:45 AM  Result Value Ref Range Status   Specimen Description BLOOD RIGHT ARM  Final   Special Requests BOTTLES DRAWN AEROBIC ONLY [place]  Final   Culture   Final    NO GROWTH 5 DAYS Note: Culture results may be compromised due to an excessive volume of blood received in culture bottles. Performed at [facility]    Report Status 11/01/2014 FINAL  Final   Culture, Urine     Status: None   Collection Time: 10/26/14  2:21 PM  Result Value Ref Range Status   Specimen Description URINE, RANDOM  Final   Special Requests NONE  Final   Colony Count   Final    8,000 COLONIES/ML Performed at [facility]    Culture   Final    INSIGNIFICANT GROWTH Performed at [facility]    Report Status 10/27/2014 FINAL  Final  MRSA PCR Screening     Status: None   Collection Time: 10/26/14  3:46 PM  Result Value Ref Range Status   MRSA by PCR NEGATIVE NEGATIVE Final    Comment:        The GeneXpert MRSA Assay (FDA approved for NASAL specimens only), is one component of a comprehensive MRSA colonization surveillance program. It is not intended to diagnose MRSA infection nor to guide or monitor treatment for MRSA infections.   Clostridium Difficile by PCR     Status: None   Collection Time: 11/02/14  9:36 AM  Result Value Ref Range Status   C difficile by pcr NEGATIVE NEGATIVE Final     Studies: No results found.  Scheduled Meds: . antiseptic oral rinse  7 mL Mouth Rinse BID  . aspirin  81 mg Oral Daily  . atorvastatin  80 mg Oral q1800  . dabigatran  150 mg Oral Q12H  . diltiazem  60 mg Oral 4 times per day  . doxycycline  100 mg Oral Q12H  . ferrous gluconate  324 mg Oral BID WC  . furosemide  80 mg Intravenous BID  . gabapentin  300 mg Oral BID  . insulin aspart  0-20 Units Subcutaneous TID WC  . insulin aspart  5 Units Subcutaneous TID WC  . insulin glargine  40 Units Subcutaneous QHS  . isosorbide mononitrate  15 mg Oral Daily  . metoprolol tartrate  50 mg Oral BID  . pantoprazole  40 mg Oral Daily  . PARoxetine  40 mg Oral BH-q7a  . predniSONE  50  mg Oral Q breakfast  . sennosides  5 mL Oral BID  . sodium chloride  10-40 mL Intracatheter Q12H  . sodium chloride  3 mL Intravenous Q12H   Continuous Infusions:   Principal Problem:   Acute diastolic CHF (congestive heart failure) Active Problems:   DM  (diabetes mellitus), type 2, uncontrolled   HYPERCHOLESTEROLEMIA   Essential hypertension   Atrial fibrillation   Ventral hernia   Hyperkalemia   Iron deficiency anemia   Abrasion of abdominal wall   Abdominal hernia   Elevated troponin   Acute respiratory failure with hypoxia   Acute on chronic respiratory failure    Time spent: 30 min    Pinchos Topel, [facility] Pager [phone]. If 7PM-7AM, please contact night-coverage at [email or web address], password [facility] 11/02/2014, 1:52 PM  LOS: 7 days

## 2014-11-02 NOTE — Clinical Social Work Placement (Signed)
Clinical Social Work Department CLINICAL SOCIAL WORK PLACEMENT NOTE 11/02/2014  Patient:  [name], [name]  Account Number:  [id number] Admit date:  10/26/2014  Clinical Social Worker:  Kemper Durie, [place]  Date/time:  11/02/2014 12:17 PM  Clinical Social Work is seeking post-discharge placement for this patient at the following level of care:   [place]   (*CSW will update this form in Epic as items are completed)   11/02/2014  Patient/family provided with [place] Department of Clinical Social Work's list of facilities offering this level of care within the geographic area requested by the patient (or if unable, by the patient's family).  11/02/2014  Patient/family informed of their freedom to choose among providers that offer the needed level of care, that participate in Medicare, Medicaid or managed care program needed by the patient, have an available bed and are willing to accept the patient.  11/02/2014  Patient/family informed of [facility]' ownership interest in [facility], as well as of the fact that they are under no obligation to receive care at this facility.  PASARR submitted to EDS on EXISTING PASARR number received on   FL2 transmitted to all facilities in geographic area requested by pt/family on  11/02/2014 FL2 transmitted to all facilities within larger geographic area on   Patient informed that his/her managed care company has contracts with or will negotiate with  certain facilities, including the following:     Patient/family informed of bed offers received:   Patient chooses bed at  Physician recommends and patient chooses bed at    Patient to be transferred to  on   Patient to be transferred to facility by  Patient and family notified of transfer on  Name of family member notified:    The following physician request were entered in Epic:   Additional Comments:  Liz Beach MSW, [place], [place], [phone]

## 2014-11-03 ENCOUNTER — Telehealth: Payer: Self-pay | Admitting: Internal Medicine

## 2014-11-03 ENCOUNTER — Encounter (HOSPITAL_BASED_OUTPATIENT_CLINIC_OR_DEPARTMENT_OTHER): Payer: Medicare (Managed Care) | Attending: Plastic Surgery

## 2014-11-03 DIAGNOSIS — J441 Chronic obstructive pulmonary disease with (acute) exacerbation: Secondary | ICD-10-CM

## 2014-11-03 DIAGNOSIS — I248 Other forms of acute ischemic heart disease: Secondary | ICD-10-CM

## 2014-11-03 DIAGNOSIS — S30811D Abrasion of abdominal wall, subsequent encounter: Secondary | ICD-10-CM

## 2014-11-03 DIAGNOSIS — J9601 Acute respiratory failure with hypoxia: Secondary | ICD-10-CM

## 2014-11-03 LAB — GLUCOSE, CAPILLARY
GLUCOSE-CAPILLARY: 157 mg/dL — AB (ref 70–99)
Glucose-Capillary: 154 mg/dL — ABNORMAL HIGH (ref 70–99)

## 2014-11-03 LAB — CBC
HEMATOCRIT: 33.5 % — AB (ref 39.0–52.0)
Hemoglobin: 10.3 g/dL — ABNORMAL LOW (ref 13.0–17.0)
MCH: 25.1 pg — AB (ref 26.0–34.0)
MCHC: 30.7 g/dL (ref 30.0–36.0)
MCV: 81.5 fL (ref 78.0–100.0)
Platelets: 329 10*3/uL (ref 150–400)
RBC: 4.11 MIL/uL — AB (ref 4.22–5.81)
RDW: 15.7 % — ABNORMAL HIGH (ref 11.5–15.5)
WBC: 14.2 10*3/uL — AB (ref 4.0–10.5)

## 2014-11-03 LAB — BASIC METABOLIC PANEL
Anion gap: 6 (ref 5–15)
BUN: 63 mg/dL — ABNORMAL HIGH (ref 6–23)
CHLORIDE: 100 mmol/L (ref 96–112)
CO2: 32 mmol/L (ref 19–32)
Calcium: 8.9 mg/dL (ref 8.4–10.5)
Creatinine, Ser: 1.16 mg/dL (ref 0.50–1.35)
GFR calc Af Amer: 77 mL/min — ABNORMAL LOW (ref >90)
GFR, EST NON AFRICAN AMERICAN: 67 mL/min — AB (ref >90)
GLUCOSE: 166 mg/dL — AB (ref 70–99)
POTASSIUM: 4.5 mmol/L (ref 3.5–5.1)
Sodium: 138 mmol/L (ref 135–145)

## 2014-11-03 MED ORDER — FUROSEMIDE 80 MG PO TABS: 80.0000 mg | ORAL_TABLET | Freq: Two times a day (BID) | ORAL | Status: DC

## 2014-11-03 MED ORDER — HYDROCODONE-ACETAMINOPHEN 5-325 MG PO TABS: 1.0000 | ORAL_TABLET | Freq: Four times a day (QID) | ORAL | Status: DC | PRN

## 2014-11-03 MED ORDER — GABAPENTIN 300 MG PO CAPS: 300.0000 mg | ORAL_CAPSULE | Freq: Two times a day (BID) | ORAL | Status: DC

## 2014-11-03 MED ORDER — SENNOSIDES-DOCUSATE SODIUM 8.6-50 MG PO TABS: 2.0000 | ORAL_TABLET | Freq: Every evening | ORAL | Status: DC | PRN

## 2014-11-03 MED ORDER — INSULIN ASPART 100 UNIT/ML ~~LOC~~ SOLN
0.0000 [IU] | Freq: Three times a day (TID) | SUBCUTANEOUS | Status: DC
  Administered 2014-11-03: 4 [IU] via SUBCUTANEOUS

## 2014-11-03 MED ORDER — ATORVASTATIN CALCIUM 80 MG PO TABS: 80.0000 mg | ORAL_TABLET | Freq: Every day | ORAL | Status: DC

## 2014-11-03 MED ORDER — DILTIAZEM HCL ER COATED BEADS 180 MG PO CP24: 180.0000 mg | ORAL_CAPSULE | Freq: Every day | ORAL | Status: DC

## 2014-11-03 MED ORDER — SACCHAROMYCES BOULARDII 250 MG PO CAPS: 250.0000 mg | ORAL_CAPSULE | Freq: Two times a day (BID) | ORAL | Status: DC

## 2014-11-03 MED ORDER — FERROUS GLUCONATE 324 (38 FE) MG PO TABS: 324.0000 mg | ORAL_TABLET | Freq: Two times a day (BID) | ORAL | Status: DC

## 2014-11-03 MED ORDER — CLONAZEPAM 0.5 MG PO TABS: 0.5000 mg | ORAL_TABLET | Freq: Every day | ORAL | Status: DC

## 2014-11-03 MED ORDER — PREDNISONE 20 MG PO TABS: ORAL_TABLET | ORAL | Status: DC

## 2014-11-03 MED ORDER — INSULIN ASPART 100 UNIT/ML ~~LOC~~ SOLN
5.0000 [IU] | Freq: Three times a day (TID) | SUBCUTANEOUS | Status: DC
  Administered 2014-11-03: 5 [IU] via SUBCUTANEOUS

## 2014-11-03 MED ORDER — DOXYCYCLINE HYCLATE 100 MG PO TABS: 100.0000 mg | ORAL_TABLET | Freq: Two times a day (BID) | ORAL | Status: DC

## 2014-11-03 MED ORDER — ALUM & MAG HYDROXIDE-SIMETH 200-200-20 MG/5ML PO SUSP: 30.0000 mL | Freq: Four times a day (QID) | ORAL | Status: DC | PRN

## 2014-11-03 MED ORDER — SIMETHICONE 80 MG PO CHEW
80.0000 mg | CHEWABLE_TABLET | Freq: Once | ORAL | Status: AC
  Administered 2014-11-03: 80 mg via ORAL
  Filled 2014-11-03: qty 1

## 2014-11-03 NOTE — Telephone Encounter (Signed)
Verified that pharmacy received doxycycline prescription.  Patient will be going to [facility] in the morning.

## 2014-11-03 NOTE — Discharge Summary (Addendum)
Physician Discharge Summary  Joseph ASHMEAD LKG:[id number] DOB: 01/24/1956 DOA: 10/26/2014  PCP: [name], MD  Admit date: 10/26/2014 Discharge date: 11/03/2014  Recommendations for Outpatient Follow-up:  1. Prednisone 20 mg tab.  Take 2 tabs daily for 2 days (starting 3/29), then 1 tab daily x 2 days, then half tab daily x 2 days then stop 2. Continue doxycycline through 3/29, then stop 3. Cardiology follow up in 1-2 weeks or sooner as needed.  Patient will be called with appointment time and date. 4. Repeat BMP by cardiology in 1 week to check potassium and creatinine 5. CBC and iron studies in 1 month to follow up anemia and leukocytosis 6. Daily weights.  Call cardiology office if he gains more than 3-lbs in one day or more than 5-lbs from his discharge weight of 225-lbs (230 -lbs) 7. To [facility] services 5 days per week with PT/OT 5 times per week please  Discharge Diagnoses:  Principal Problem:   Acute diastolic CHF (congestive heart failure) Active Problems:   DM (diabetes mellitus), type 2, uncontrolled   HYPERCHOLESTEROLEMIA   Essential hypertension   Atrial fibrillation   Ventral hernia   Hyperkalemia   Iron deficiency anemia   Abrasion of abdominal wall   Abdominal hernia   Demand ischemia   Acute respiratory failure with hypoxia   Acute on chronic respiratory failure   COPD with acute exacerbation   Discharge Condition: stable, improved  Diet recommendation: Low-sodium, diabetic, 1.2 L fluid restriction  Wt Readings from Last 3 Encounters:  11/03/14 102.5 kg (225 lb 15.5 oz)  10/08/14 109.77 kg (242 lb)  06/10/14 104.327 kg (230 lb)    History of present illness:   59 y.o. 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 This was associated with malaise, poor oral intake, subjective fevers, substernal area chest pain, non radiating.   In ED, pt was noted to be hypoxic with oxygen saturation in mid 80's and requiring placement on BiPAP, initial temp (oral) 99.4 F but rectal temp 100.2 F. RR in 30's, BP 119/51. Blood work notable for WBC 14 K, K 5.5, Cr 1.37, troponin 0.12. Marland Kitchen ECG demonstrated NSR, RBBB, RAD, ST depression in leads V2-V6. Compared to 04/23/13, the anterolateral STD were new. CXR was concerning for pulmonary vascular congestion and cardiology was consulted.  He was admitted to the stepdown unit and treated for diastolic heart failure, A. fib with RVR.  His troponins rose, likely demand ischemia from his A. fib with RVR in the setting of acute on chronic diastolic heart failure exacerbation. Despite diuresis he continued to have some wheezing and was started on steroids for acute COPD exacerbation. He was treated for sepsis secondary to a left lower extremity cellulitis related to a diabetic foot ulcer.    Major events since admission: 3/20 - admit to SDU, concern ACS, d CHF, sepsis of unclear source  3/21 - persistent a-fib with RVR, pt now DNI 3/22 - troponins continue trending up, added solumedrol due to wheezing  3/23 - more chest pain  this AM, pt apparently did not get any Lasix 3/22, restarted this AM 3/23 3/24 - no chest pain but with more abd pain, no BM for three days  3/25 - reports feeling better, stop Heparin drip and place back on home regime Pradaxa   Hospital Course:   Sepsis, likely secondary to LLE cellulitis from diabetic foot ulcer - criteria for sepsis met: T 100.2 F, RR in 30's, WBC 14 K, lactic acid 2.36, multiple organ  systems failure  -  He was started on vancomycin and aztreonam - urine culture with insignificant growth, blood cultures negative -  He was transitioned to doxycycline on 3/24 secondary to LLE cellulitis and continued to improve clinically  - continue doxycycline until 3/29 - WBC trending down and erythema resolved - He was evaluated by wound care who recommended silver hydrofiber dressing to the left foot wound and the abdominal wound. The abdominal wound should be covered with foam and the left foot wound should be covered with gauze. The silver Hydrofiber dressing should be changed every 3 days or sooner as needed.  Acute hypoxic respiratory failuresecondary to acute on chronic diastolic CHF, resolving -  He was seen by cardiology - weight trended down: 242 lbs at admission and currently 225 pounds at the time of discharge - recommend Lasix 80 by mouth Twice a day -  Please weigh the patient daily and if he gains more than 3 pounds in 1 day or 5 pounds compared to the weight at the time of discharge, please call the cardiology office immediately -  Continue low-sodium diet  Acute on chronic COPD -  he was given duo nebs and started on IV Solu-Medrol on 3/22  -  He has been transitioned to prednisone to complete a tapering dose, see above   NSTEMI, likely demand ischemia - management per cardiology  - Resumed Pradaxa  - pt determined not to be a candidate for an intervention due to multiple comorbid conditions  - continue Metoprolol and high dose statin  Paroxysmal atrial fibrillation, CHADS score 5 (CHF, HTN, DM, stroke),  with a-fib with RVR  on admission -  He was initially placed on a diltiazem infusion was quickly transitioned to metoprolol  -  he had another episode of A. fib with RVR 2/26 which briefly required another diltiazem infusion -  He was restarted on Cardizem which is a home medication and his A. fib with RVR resolved -   since resuming his home medications he  has been in sinus bradycardia -  his Cardizem dose was decreased slightly due to his bradycardia -  Continue metoprolol  Constipation, resolved with enema that progressed to diarrhea -  his scheduled laxatives were discontinued - C. 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, neuropathies  - A1C 7.9, pt on high dose insulin at home -  Metformin was held but can be resumed at discharge -  continue Lantus 40 U QHS  Hyperkalemia resolved  HLD -   statin increased to high-dose atorvastatin 80 mg    Essential hypertension, stable - Continued BB and CCB, see above    Ventral hernia - abd now with no erythema and non TTP - close monitoring - pt determined in the past to be poor surgical candidate  Leukocytosis due to steroids and sepsis, trending down -  Repeat in 2-4 weeks as outpatient to ensure resolution   Iron deficiency anemia, hemoglobin stable - continue ferrous sulfate  Morbid obesity  - Body mass index is 47.28  Acute functional quadriplegia - OOB to chair  -  PT/OT recommending SNF for rehabilitation   Consultants:  PCCM  Cardiology  Procedures:  CXR/KUB  Antibiotics: Vancomycin 3/20 --> 3/24 Aztreonam 3/20 --> 3/24 Doxycycline 3/24 --> 3/29  Blood Cultures 3/20 -->NGF Urine cultures 3/20 --> insignificant growth, final  Discharge Exam: Filed Vitals:   11/03/14 1014  BP:   Pulse: 68  Temp:   Resp:    Filed Vitals:   11/03/14 0445 11/03/14 0558 11/03/14 1013 11/03/14 1014  BP:  126/52 130/58   Pulse:  65  68  Temp:  97.5 F (36.4 C)    TempSrc:  Oral    Resp:  18    Height:      Weight: 102.5 kg (225 lb 15.5 oz)     SpO2:  100%       General: Obese adult male, No acute distress, perioral pallor  HEENT: NCAT,  MMM  Cardiovascular: RRR, nl S1, S2 no mrg, 2+ pulses, warm extremities  Respiratory: Course bilateral BS, no wheezes or rhonchi, no increased WOB  Abdomen: NABS, soft, moderately distended, nontender. Very large ventral hernia  MSK: Decreased tone and bulk, 1+ LLE pitting edema. Left heel bandaged, mildly warm but not erythematous. Right foot forefoot amputation  Neuro: Grossly intact  Discharge Instructions      Discharge Instructions    (HEART FAILURE PATIENTS) Call MD:  Anytime you have any of the following symptoms: 1) 3 pound weight gain in 24 hours or 5 pounds in 1 week 2) shortness of breath, with or without a dry hacking cough 3) swelling in the hands, feet or stomach 4) if you have to sleep on extra pillows at night in order to breathe.    Complete by:  As directed      Call MD for:  difficulty breathing, headache or visual disturbances    Complete by:  As directed      Call MD for:  extreme fatigue    Complete by:  As directed      Call MD for:  hives    Complete by:  As directed      Call MD for:  persistant dizziness or light-headedness    Complete by:  As directed      Call MD for:  persistant nausea and vomiting    Complete by:  As directed      Call MD for:  redness, tenderness, or signs of infection (pain, swelling, redness, odor or green/yellow discharge around incision site)    Complete by:  As directed      Call MD for:  severe uncontrolled pain    Complete by:  As directed      Call MD for:  temperature >100.4    Complete by:  As directed      Diet - low sodium heart healthy    Complete by:  As directed      Diet Carb Modified    Complete by:  As directed      Discharge wound care:    Complete by:  As directed   silver hydrofiber dressing to the left foot wound and the abdominal wound. The abdominal wound should be covered with foam and the left foot wound should be  covered with gauze. The silver Hydrofiber dressing should be changed every 3 days  or sooner as needed.     Increase activity slowly    Complete by:  As directed             Medication List    TAKE these medications        alum & mag hydroxide-simeth 200-200-20 MG/5ML suspension  Commonly known as:  MAALOX/MYLANTA  Take 30 mLs by mouth every 6 (six) hours as needed for indigestion or heartburn.     aspirin 81 MG EC tablet  Take 1 tablet (81 mg total) by mouth daily.     atorvastatin 80 MG tablet  Commonly known as:  LIPITOR  Take 1 tablet (80 mg total) by mouth daily at 6 PM.     clonazePAM 0.5 MG tablet  Commonly known as:  KLONOPIN  Take 1 tablet (0.5 mg total) by mouth at bedtime.     dabigatran 150 MG Caps capsule  Commonly known as:  PRADAXA  Take 150 mg by mouth every 12 (twelve) hours.     diltiazem 180 MG 24 hr capsule  Commonly known as:  CARDIZEM CD  Take 1 capsule (180 mg total) by mouth daily.     doxycycline 100 MG tablet  Commonly known as:  VIBRA-TABS  Take 1 tablet (100 mg total) by mouth every 12 (twelve) hours.     ferrous gluconate 324 MG tablet  Commonly known as:  FERGON  Take 1 tablet (324 mg total) by mouth 2 (two) times daily with a meal.     furosemide 80 MG tablet  Commonly known as:  LASIX  Take 1 tablet (80 mg total) by mouth 2 (two) times daily.     gabapentin 300 MG capsule  Commonly known as:  NEURONTIN  Take 1 capsule (300 mg total) by mouth 2 (two) times daily.     HYDROcodone-acetaminophen 5-325 MG per tablet  Commonly known as:  NORCO/VICODIN  Take 1 tablet by mouth every 6 (six) hours as needed for moderate pain.     hydrocortisone 2.5 % ointment  Apply topically 3 (three) times daily. To leg around wound. Do not apply to broken skin.     insulin glargine 100 UNIT/ML injection  Commonly known as:  LANTUS  Inject 40 Units into the skin at bedtime.     isosorbide mononitrate 30 MG 24 hr tablet  Commonly known as:  IMDUR  Take 30 mg by mouth every morning.     magnesium oxide 400 MG tablet  Commonly  known as:  MAG-OX  Take 400 mg by mouth daily.     metFORMIN 1000 MG tablet  Commonly known as:  GLUCOPHAGE  Take 1,000 mg by mouth 2 (two) times daily with a meal.     metoprolol tartrate 25 MG tablet  Commonly known as:  LOPRESSOR  Take 50 mg by mouth daily.     nitroGLYCERIN 0.4 MG SL tablet  Commonly known as:  NITROSTAT  Place 0.4 mg under the tongue every 5 (five) minutes as needed for chest pain.     omeprazole 20 MG capsule  Commonly known as:  PRILOSEC  Take 20 mg by mouth daily.     PARoxetine 40 MG tablet  Commonly known as:  PAXIL  Take 40 mg by mouth every morning.     predniSONE 20 MG tablet  Commonly known as:  DELTASONE  Take 2 tabs daily for 2 days, then 1 tab daily  x 2 days, then half tab daily x 2 days then stop     saccharomyces boulardii 250 MG capsule  Commonly known as:  FLORASTOR  Take 1 capsule (250 mg total) by mouth 2 (two) times daily.     senna-docusate 8.6-50 MG per tablet  Commonly known as:  Senokot-S  Take 2 tablets by mouth at bedtime as needed for mild constipation.     Vitamin D3 1000 UNITS Caps  Take 1,000 Units by mouth daily.       Follow-up Information    Follow up with [name], MD. Schedule an appointment as soon as possible for a visit in 2 weeks.   Specialty:  Family Medicine   Contact information:   [address] [address] [phone]       Follow up with [facility]. Schedule an appointment as soon as possible for a visit in 2 weeks.   Specialty:  Cardiology   Contact information:   [address], [place] [address] [phone]       The results of significant diagnostics from this hospitalization (including imaging, microbiology, ancillary and laboratory) are listed below for reference.    Significant Diagnostic Studies: Dg Chest Port 1 View  10/29/2014   CLINICAL DATA:  Chest pain that goes down left arm and left arm is numb.  Hx of MI 4 days ago with fluid build up, HTN, diabetes  EXAM: PORTABLE CHEST - 1 VIEW  COMPARISON:  10/26/2014  FINDINGS: Focal airspace opacity has developed above the minor fissure in the right upper lobe since the prior study. There is persistent irregular interstitial thickening bilaterally and probable small effusions. Cardiopericardial silhouette is mildly enlarged. No mediastinal or hilar masses.  New right PICC has its tip in the mid superior vena cava.  IMPRESSION: 1. Persistent interstitial thickening and mild cardiomegaly with a small new area of airspace opacity above the right minor fissure, likely airspace edema. Findings consistent with slightly worsened congestive heart failure. PICC tip in these mid superior vena cava.   Electronically Signed   By: Lajean Manes M.D.   On: 10/29/2014 10:41   Dg Chest Port 1 View  10/26/2014   CLINICAL DATA:  Dyspnea  EXAM: PORTABLE CHEST - 1 VIEW  COMPARISON:  11/09/2012  FINDINGS: There is moderate cardiomegaly. There is vascular and interstitial prominence, worsened from 11/09/2012. No large effusions.  IMPRESSION: Prominent vasculature and interstitium, likely mild congestive heart failure.   Electronically Signed   By: Andreas Newport M.D.   On: 10/26/2014 04:25   Dg Abd Portable 2v  10/30/2014   CLINICAL DATA:  59 year old male with abdominal pain and distension for 5 days. Initial encounter.  EXAM: PORTABLE ABDOMEN - 2 VIEW  COMPARISON:  CT Abdomen and Pelvis 09/08/2010.  FINDINGS: Supine AP portable and left side down portable lateral decubitus views of the abdomen. Mild motion artifact. Large body habitus. No pneumoperitoneum identified. Non obstructed bowel gas pattern. There is some oral contrast visible. The previously seen large ventral abdominal hernia probably persists and may explain the mild differential density of bowel in the central lower abdomen. Scoliosis and degenerative changes in the lumbar spine.  IMPRESSION: Normal bowel gas  pattern, no free air. Suspect persistent large ventral abdominal hernia.   Electronically Signed   By: Genevie Ann M.D.   On: 10/30/2014 16:25    Microbiology: Recent Results (from the past 240 hour(s))  Blood culture (routine x 2)  Status: None   Collection Time: 10/26/14  3:45 AM  Result Value Ref Range Status   Specimen Description BLOOD LEFT ARM  Final   Special Requests BOTTLES DRAWN AEROBIC AND ANAEROBIC 10CC EA  Final   Culture   Final    NO GROWTH 5 DAYS Note: Culture results may be compromised due to an excessive volume of blood received in culture bottles. Performed at [facility]    Report Status 11/01/2014 FINAL  Final  Blood culture (routine x 2)     Status: None   Collection Time: 10/26/14  3:45 AM  Result Value Ref Range Status   Specimen Description BLOOD RIGHT ARM  Final   Special Requests BOTTLES DRAWN AEROBIC ONLY [place]  Final   Culture   Final    NO GROWTH 5 DAYS Note: Culture results may be compromised due to an excessive volume of blood received in culture bottles. Performed at [facility]    Report Status 11/01/2014 FINAL  Final  Culture, Urine     Status: None   Collection Time: 10/26/14  2:21 PM  Result Value Ref Range Status   Specimen Description URINE, RANDOM  Final   Special Requests NONE  Final   Colony Count   Final    8,000 COLONIES/ML Performed at [facility]    Culture   Final    INSIGNIFICANT GROWTH Performed at [facility]    Report Status 10/27/2014 FINAL  Final  MRSA PCR Screening     Status: None   Collection Time: 10/26/14  3:46 PM  Result Value Ref Range Status   MRSA by PCR NEGATIVE NEGATIVE Final    Comment:        The GeneXpert MRSA Assay (FDA approved for NASAL specimens only), is one component of a comprehensive MRSA colonization surveillance program. It is not intended to diagnose MRSA infection nor to guide or monitor treatment for MRSA infections.   Clostridium Difficile by PCR      Status: None   Collection Time: 11/02/14  9:36 AM  Result Value Ref Range Status   C difficile by pcr NEGATIVE NEGATIVE Final     Labs: Basic Metabolic Panel:  Recent Labs Lab 10/29/14 0411 10/31/14 0455 11/01/14 1045 11/02/14 0415 11/03/14 0525  NA 133* 132* 140 137 138  K 4.6 4.4 4.5 4.2 4.5  CL 98 98 103 101 100  CO2 _0 32 32  GLUCOSE 237* 280* 169* 171* 166*  BUN 47* 72* 66* 64* 63*  CREATININE 1.26 1.19 1.08 0.99 1.16  CALCIUM 8.4 8.3* 9.1 9.0 8.9  MG  --   --  2.5  --   --    Liver Function Tests: No results for input(s): AST, ALT, ALKPHOS, BILITOT, PROT, ALBUMIN in the last 168 hours. No results for input(s): LIPASE, AMYLASE in the last 168 hours. No results for input(s): AMMONIA in the last 168 hours. CBC:  Recent Labs Lab 10/29/14 0411 10/30/14 0500 10/31/14 0455 11/01/14 1045 11/03/14 0525  WBC 7.2 12.5* 16.1* 15.8* 14.2*  HGB 9.3* 9.0* 9.9* 10.6* 10.3*  HCT 30.0* 29.2* 31.4* 33.6* 33.5*  MCV 80.4 80.4 78.5 81.2 81.5  PLT 275 314 337 324 329   Cardiac Enzymes:  Recent Labs Lab 10/27/14 1445 10/27/14 1957 10/27/14 2350 10/28/14 0228  TROPONINI 3.41* 5.19* 4.90* 6.47*   BNP: BNP (last 3 results)  Recent Labs  10/26/14 0329  BNP 345.2*    ProBNP (last 3 results) No results  for input(s): PROBNP in the last 8760 hours.  CBG:  Recent Labs Lab 11/02/14 0636 11/02/14 1152 11/02/14 1636 11/02/14 2225 11/03/14 0552  GLUCAP 138* 183* 234* 173* 154*    Time coordinating discharge: 35 minutes  Signed:  [name] Topete  [facility] 11/03/2014, 11:02 AM

## 2014-11-03 NOTE — Progress Notes (Signed)
Patient was assessed and SNF placement was initiated over there weekend- however CSW received a call from [facility] re: disposition. She indicated that the [facility] program could continue to serve patient by increased services to daily and for PT/OT to see patient through [facility].  Marcie Bal spoke to patient's sister [name] and this CSW followed up on this call as well.  Sister prefers patient to return home at d/c and she would supplement his care at home along with P[facility].  Marcie Bal arranged for [facility] to transport patient from the hospital.  Nursing notified.  CSW spoke to to patient who stated that he preferred to return home as well rather than see placement at this time.  CSW signing off.  Lorie Phenix. Pauline Good, [place]

## 2014-11-03 NOTE — Progress Notes (Signed)
[name] to be D/C'd Home per MD order.  Discussed with the patient and all questions fully answered.  VSS, Skin clean, dry and intact without evidence of skin break down, no evidence of skin tears noted. IV catheter discontinued intact. Site without signs and symptoms of complications. Dressing and pressure applied.  An After Visit Summary was printed and given to the patient. Patient received prescription.  D/c education completed with patient/family including follow up instructions, medication list, d/c activities limitations if indicated, with other d/c instructions as indicated by MD - patient able to verbalize understanding, all questions fully answered.   Patient instructed to return to ED, call 911, or call MD for any changes in condition.   Patient escorted via WC, and D/C home via [facility].   [name] D 11/03/2014 12:43 PM

## 2014-11-05 ENCOUNTER — Other Ambulatory Visit: Payer: Self-pay

## 2014-11-05 MED ORDER — HYDROCODONE-ACETAMINOPHEN 5-325 MG PO TABS: ORAL_TABLET | ORAL | Status: DC

## 2014-11-05 NOTE — Telephone Encounter (Signed)
Faxed to [facility] Fax Number: [phone], Phone Number [phone]  

## 2014-11-17 NOTE — Progress Notes (Signed)
No show  This encounter was created in error - please disregard.

## 2014-11-20 ENCOUNTER — Encounter: Payer: Medicare (Managed Care) | Admitting: Physician Assistant

## 2014-11-21 ENCOUNTER — Encounter (HOSPITAL_BASED_OUTPATIENT_CLINIC_OR_DEPARTMENT_OTHER): Payer: Medicare (Managed Care)

## 2014-11-28 ENCOUNTER — Other Ambulatory Visit: Payer: Self-pay | Admitting: Family Medicine

## 2014-11-28 ENCOUNTER — Ambulatory Visit
Admission: RE | Admit: 2014-11-28 | Discharge: 2014-11-28 | Disposition: A | Discharge status: Home / Self Care | Payer: No Typology Code available for payment source | Source: Ambulatory Visit | Attending: Family Medicine | Admitting: Family Medicine

## 2014-11-28 DIAGNOSIS — R0602 Shortness of breath: Secondary | ICD-10-CM

## 2014-11-28 DIAGNOSIS — R0989 Other specified symptoms and signs involving the circulatory and respiratory systems: Secondary | ICD-10-CM

## 2014-12-15 ENCOUNTER — Encounter: Payer: Self-pay | Admitting: Physician Assistant

## 2014-12-15 ENCOUNTER — Ambulatory Visit (INDEPENDENT_AMBULATORY_CARE_PROVIDER_SITE_OTHER): Payer: Medicare (Managed Care) | Admitting: Physician Assistant

## 2014-12-15 VITALS — BP 130/60 | HR 69 | Ht 64.0 in | Wt 213.0 lb

## 2014-12-15 DIAGNOSIS — I5032 Chronic diastolic (congestive) heart failure: Secondary | ICD-10-CM | POA: Diagnosis not present

## 2014-12-15 DIAGNOSIS — I214 Non-ST elevation (NSTEMI) myocardial infarction: Secondary | ICD-10-CM

## 2014-12-15 DIAGNOSIS — R0602 Shortness of breath: Secondary | ICD-10-CM

## 2014-12-15 DIAGNOSIS — I480 Paroxysmal atrial fibrillation: Secondary | ICD-10-CM | POA: Diagnosis not present

## 2014-12-15 DIAGNOSIS — E7800 Pure hypercholesterolemia, unspecified: Secondary | ICD-10-CM

## 2014-12-15 DIAGNOSIS — I10 Essential (primary) hypertension: Secondary | ICD-10-CM | POA: Diagnosis not present

## 2014-12-15 DIAGNOSIS — K439 Ventral hernia without obstruction or gangrene: Secondary | ICD-10-CM

## 2014-12-15 DIAGNOSIS — I6523 Occlusion and stenosis of bilateral carotid arteries: Secondary | ICD-10-CM

## 2014-12-15 LAB — HEPATIC FUNCTION PANEL
ALT: 14 U/L (ref 0–53)
AST: 13 U/L (ref 0–37)
Albumin: 3 g/dL — ABNORMAL LOW (ref 3.5–5.2)
Alkaline Phosphatase: 70 U/L (ref 39–117)
BILIRUBIN TOTAL: 0.3 mg/dL (ref 0.2–1.2)
Bilirubin, Direct: 0 mg/dL (ref 0.0–0.3)
Total Protein: 6.6 g/dL (ref 6.0–8.3)

## 2014-12-15 LAB — BASIC METABOLIC PANEL
BUN: 43 mg/dL — ABNORMAL HIGH (ref 6–23)
CALCIUM: 9.4 mg/dL (ref 8.4–10.5)
CO2: 37 mEq/L — ABNORMAL HIGH (ref 19–32)
CREATININE: 1.29 mg/dL (ref 0.40–1.50)
Chloride: 87 mEq/L — ABNORMAL LOW (ref 96–112)
GFR: 60.31 mL/min (ref >60.00)
GLUCOSE: 175 mg/dL — AB (ref 70–99)
POTASSIUM: 3.9 meq/L (ref 3.5–5.1)
Sodium: 130 mEq/L — ABNORMAL LOW (ref 135–145)

## 2014-12-15 LAB — BRAIN NATRIURETIC PEPTIDE: Pro B Natriuretic peptide (BNP): 290 pg/mL — ABNORMAL HIGH (ref 0.0–100.0)

## 2014-12-15 NOTE — Patient Instructions (Signed)
Medication Instructions:  None  Labwork: Your physician recommends that you have lab work today (BNP, BMET, LFT)   Testing/Procedures: Your physician has requested that you have an echocardiogram. Echocardiography is a painless test that uses sound waves to create images of your heart. It provides your doctor with information about the size and shape of your heart and how well your heart's chambers and valves are working. This procedure takes approximately one hour. There are no restrictions for this procedure.    Follow-Up: Your physician recommends that you schedule a follow-up appointment in: 1 month with Richardson Dopp, PA-C on a day that Dr. Acie Fredrickson is in the office.    Any Other Special Instructions Will Be Listed Below (If Applicable).

## 2014-12-15 NOTE — Progress Notes (Signed)
Cardiology Office Note   Date:  12/15/2014   ID:  [name], DOB 09/17/1955, MRN [id number]  PCP:  [name], MD  Cardiologist:  [name]     Chief Complaint  Patient presents with  . Congestive Heart Failure  . Atrial Fibrillation  . Hospitalization Follow-up     History of Present Illness: [name] is a 59 y.o. male with a hx of PAF, HTN, diabetes, HL, prior stroke, carotid stenosis, COPD, mental retardation. He was evaluated by Dr. Acie Fredrickson in 08/2012 for chest pain. Myoview was low risk. Echocardiogram in 3/14 demonstrated normal LV function with mild diastolic dysfunction. He has a history of diabetic ulcer and underwent right midfoot amputation in 04/2013.  Admitted 3/20-3/28 with acute hypoxic respiratory failure secondary to acute on chronic diastolic CHF and atrial fibrillation with RVR in the setting of sepsis syndrome related to left lower extremity cellulitis from diabetic foot ulcer.  He had elevated troponins possibly related to demand ischemia. He also required treatment for COPD exacerbation. He converted to sinus rhythm with rate control therapy.  He was followed closely by cardiology. Given his multiple comorbid conditions, it was decided to treat him conservatively for his non-STEMI.  He returns for FU.  He is currently staying at [facility]. He is here by himself today. He did not arrive with any paperwork. He is in a wheelchair. He does walk with a walker at times. He has dyspnea with exertion. He is NYHA 2b-3. He denies chest pain. He sleeps on 3 pillows without change. He denies PND. LE edema is unchanged. Review of his chart, he does have a chest x-ray dated 4/22. This demonstrated findings consistent with CHF. However, findings were improved when compared to prior chest x-ray.  He denies syncope. He does have some issues with dizziness after prolonged standing. He continues to have left lower extremity pain.   Studies/Reports  Reviewed Today:  Echo 10/16/12 - Mild concentric hypertrophy. Systolic function was vigorous.  EF 65% to 70%. Wall motion was normal; Grade 1diastolic dysfunction.  - Left atrium: The atrium was normal in size.  Nuclear study 09/06/12 Myoview scan with small apical defect Seen in horizontal images only. Cannot exclude tiny region of apical ischemia vs shift soft tissue. Otherwise normal perfusion. Overall low risk scan.  LV Ejection Fraction: 69%. LV Wall Motion: NL LV Function; NL Wall Motion  Carotid US 8/67 RICA 67-20%; LICA 94-70%  Past Medical History  Diagnosis Date  . Hypertension   . Diabetes mellitus   . Mental retardation     Sister helps to take care of him and takes him to appts  . Tobacco user     Smokes 1ppd for multiple years.  Quit after hosp 09/2010.  Marland Kitchen Anemia     History of Iron Def Anemia  . Anxiety   . Hyperlipidemia   . Atrial fibrillation 06/2009    CHADS score 2 (HTN, DM), was not on coumadin, but now on Pradaxa for Afib  . Lung nodule   . Chronic low back pain   . CVA (cerebral infarction) 09/2010    Bilateral with Left > Right  . Abdominal hernia     Chronic, not a good surgical candidate  . Carotid artery occlusion   . Obesity   . COPD (chronic obstructive pulmonary disease)   . Abscess, abdomen 12/31/2010    Referred to Wound Care in 01/2011 because of multiple abd abscess with VERY large ventral hernia (please look  at image of CT abd/pelvis 09/2010).  Because of hernia I was hesitant to I&D.      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 Stroke     right hand numbness  . GERD (gastroesophageal reflux disease)   . Pneumonia     hx of  . Shortness of breath     with ambulation  . Edema of both legs     takes Lasix  . Numbness and tingling in hands   . Itching     all over body; pt scratches and has sores on bilateral arms and abdomen  . PONV (postoperative nausea and vomiting)   . Mental retardation     Past  Surgical History  Procedure Laterality Date  . Carotid arteriogram  10/2010    30% right ICA stenosis, 40% left ICA stenosis   . Transesophageal echocardiogram  09/2010    No ASD or PFO. EF 60-65%.  Normal systolic function. No evidence of thrombus.   . Transthoracic echocardiogram  09/2010     The cavity size was normal. Systolic function was vigorous.  EF 65-70%.  Normal wall funciton.   . Debridment of decubitus ulcer Right 02/13/2013  . I&d extremity Right 02/13/2013    Procedure: IRRIGATION AND DEBRIDEMENT FOOT ULCER;  Surgeon: Johnny Bridge, MD;  Location: [place];  Service: Orthopedics;  Laterality: Right;  PULSE LAVAGE  . Cataract extraction, bilateral    . Arm surgery Left     as a child  . Amputation Right 03/29/2013    Procedure: AMPUTATION RAY;  Surgeon: Newt Minion, MD;  Location: [place];  Service: Orthopedics;  Laterality: Right;  Right Foot 3rd and Possible 4th Ray Amputation  . Multiple tooth extractions    . Amputation Right 04/23/2013    Procedure: AMPUTATION RIGHT MID-FOOT;  Surgeon: Newt Minion, MD;  Location: [place];  Service: Orthopedics;  Laterality: Right;  . Esophagogastroduodenoscopy (egd) with propofol N/A 06/10/2014    Procedure: ESOPHAGOGASTRODUODENOSCOPY (EGD) WITH PROPOFOL;  Surgeon: Jerene Bears, MD;  Location: [facility];  Service: Gastroenterology;  Laterality: N/A;  . Colonoscopy with propofol N/A 06/10/2014    Procedure: COLONOSCOPY WITH PROPOFOL;  Surgeon: Jerene Bears, MD;  Location: [facility];  Service: Gastroenterology;  Laterality: N/A;  . Givens capsule study N/A 07/09/2014    Procedure: GIVENS CAPSULE STUDY;  Surgeon: Jerene Bears, MD;  Location: [facility];  Service: Gastroenterology;  Laterality: N/A;     Current Outpatient Prescriptions  Medication Sig Dispense Refill  . alum & mag hydroxide-simeth (MAALOX/MYLANTA) 200-200-20 MG/5ML suspension Take 30 mLs by mouth every 6 (six) hours as needed for indigestion or heartburn. 355 mL 0  .  aspirin EC 81 MG EC tablet Take 1 tablet (81 mg total) by mouth daily. 90 tablet 3  . atorvastatin (LIPITOR) 80 MG tablet Take 1 tablet (80 mg total) by mouth daily at 6 PM. 45 tablet 0  . Cholecalciferol (VITAMIN D3) 1000 UNITS CAPS Take 1,000 Units by mouth daily.    . clonazePAM (KLONOPIN) 0.5 MG tablet Take 1 tablet (0.5 mg total) by mouth at bedtime. 30 tablet 0  . dabigatran (PRADAXA) 150 MG CAPS Take 150 mg by mouth every 12 (twelve) hours.    Marland Kitchen diltiazem (CARDIZEM CD) 180 MG 24 hr capsule Take 1 capsule (180 mg total) by mouth daily. 30 capsule 0  . doxycycline (VIBRA-TABS) 100 MG tablet Take 1 tablet (100 mg total) by mouth every  12 (twelve) hours. 4 tablet 0  . ferrous gluconate (FERGON) 324 MG tablet Take 1 tablet (324 mg total) by mouth 2 (two) times daily with a meal. 60 tablet 0  . furosemide (LASIX) 80 MG tablet Take 1 tablet (80 mg total) by mouth 2 (two) times daily. 60 tablet 0  . gabapentin (NEURONTIN) 300 MG capsule Take 1 capsule (300 mg total) by mouth 2 (two) times daily. 60 capsule 0  . HYDROcodone-acetaminophen (NORCO/VICODIN) 5-325 MG per tablet 1 by mouth once daily as needed for pain control 30 tablet 0  . hydrocortisone 2.5 % ointment Apply topically 3 (three) times daily. To leg around wound. Do not apply to broken skin. 30 g 0  . insulin glargine (LANTUS) 100 UNIT/ML injection Inject 40 Units into the skin at bedtime.     . isosorbide mononitrate (IMDUR) 30 MG 24 hr tablet Take 30 mg by mouth every morning.    . magnesium oxide (MAG-OX) 400 MG tablet Take 400 mg by mouth daily.    . metFORMIN (GLUCOPHAGE) 1000 MG tablet Take 1,000 mg by mouth 2 (two) times daily with a meal.    . metoprolol tartrate (LOPRESSOR) 25 MG tablet Take 50 mg by mouth daily.     . nitroGLYCERIN (NITROSTAT) 0.4 MG SL tablet Place 0.4 mg under the tongue every 5 (five) minutes as needed for chest pain.     Marland Kitchen omeprazole (PRILOSEC) 20 MG capsule Take 20 mg by mouth daily.    Marland Kitchen PARoxetine (PAXIL)  40 MG tablet Take 40 mg by mouth every morning.    . predniSONE (DELTASONE) 20 MG tablet Take 2 tabs daily for 2 days, then 1 tab daily x 2 days, then half tab daily x 2 days then stop 7 tablet 0  . saccharomyces boulardii (FLORASTOR) 250 MG capsule Take 1 capsule (250 mg total) by mouth 2 (two) times daily. 60 capsule 2  . senna-docusate (SENOKOT-S) 8.6-50 MG per tablet Take 2 tablets by mouth at bedtime as needed for mild constipation.     No current facility-administered medications for this visit.    Allergies:   Penicillins    Social History:  The patient  reports that he quit smoking about 4 years ago. His smoking use included Cigarettes. He has a 42 pack-year smoking history. He quit smokeless tobacco use about 4 years ago. He reports that he drinks alcohol. He reports that he does not use illicit drugs.   Family History:  The patient's family history includes Breast cancer in his maternal grandmother; Heart attack in his maternal grandmother; Heart disease in his brother, father, maternal grandmother, mother, and sister; Hypertension in his sister; Peripheral vascular disease in his father; Stomach cancer in his maternal uncle. There is no history of Colon cancer or Stroke.    ROS:   Please see the history of present illness.   Review of Systems  Constitution: Negative for fever.  Respiratory: Negative for cough and wheezing.   Gastrointestinal: Negative for hematochezia and melena.  All other systems reviewed and are negative.    PHYSICAL EXAM: VS:  BP 130/60 mmHg  Pulse 69  Ht 5\' 4"  (1.626 m)  Wt 213 lb (96.616 kg)  BMI 36.54 kg/m2    Wt Readings from Last 3 Encounters:  12/15/14 213 lb (96.616 kg)  10/08/14 242 lb (109.77 kg)  06/10/14 230 lb (104.327 kg)     GEN: Chronically ill-appearing male sitting in a wheelchair in no acute distress HEENT: normal Neck: no  JVD at 90, no masses Cardiac:  Normal S1/S2, RRR; no murmur ,  no rubs or gallops, tight 1-2+ bilateral  LE Respiratory:  Coarse breath sounds at the bases bilaterally, no wheezing. GI: Large ventral hernia noted, + BS MS: no deformity or atrophy Skin: warm and dry  Neuro:  CNs II-XII intact, Strength and sensation are intact Psych: Normal affect   EKG:  EKG is ordered today.  It demonstrates:   NSR, HR 69, RBBB, inferior Q waves, nonspecific ST-T wave changes   Recent Labs: 10/26/2014: ALT 17; B Natriuretic Peptide 345.2* 11/01/2014: Magnesium 2.5 11/03/2014: BUN 63*; Creatinine 1.16; Hemoglobin 10.3*; Platelets 329; Potassium 4.5; Sodium 138    Lipid Panel    Component Value Date/Time   CHOL 124 11/10/2012 0255   TRIG 112 11/10/2012 0255   HDL 24* 11/10/2012 0255   CHOLHDL 5.2 11/10/2012 0255   VLDL 22 11/10/2012 0255   LDLCALC 78 11/10/2012 0255   LDLDIRECT 54 08/23/2012 1447    Recent Labs  10/26/14 0329 10/26/14 1610 10/27/14 0357 10/27/14 1445 10/27/14 1957 10/27/14 2350 10/28/14 0228  TROPONINI 0.12* 5.92* 3.44* 3.41* 5.19* 4.90* 6.47*       ASSESSMENT AND PLAN:  Chronic diastolic CHF (congestive heart failure) Volume appears to be stable. His weight is down since his hospital stay. However, he continues to have significant LE edema. He tells me that this is typical for him. I will obtain a BMET, BNP today. If his BNP is significantly elevated compared to prior, I will adjust his diuretics.  Paroxysmal atrial fibrillation Maintaining NSR. He remains on Pradaxa for anticoagulation.  NSTEMI (non-ST elevated myocardial infarction) The patient had a significant elevation in his troponin during his recent hospitalization. He has inferior Q waves on ECG. As noted during his hospitalization, I am not certain that he is a candidate for aggressive, invasive evaluation. However, I will obtain an echocardiogram. If his ejection fraction is reduced, we should consider taking him off of diltiazem, changing him from metoprolol to carvedilol and adding an ACE inhibitor or ARB.  Continue aspirin, statin.  Essential hypertension  Controlled.  HYPERCHOLESTEROLEMIA Continue statin. Check LFTs. He is not fasting today.  Carotid stenosis, bilateral Follow-up with vascular surgery.  Ventral hernia, recurrence not specified He is not a surgical candidate.    Current medicines are reviewed at length with the patient today.  Concerns regarding medicines are as outlined above.  The following changes have been made:    Change metoprolol tartrate from 50 mg daily to 25 mg twice a day   Labs/ tests ordered today include:   Orders Placed This Encounter  Procedures  . Basic Metabolic Panel (BMET)  . Hepatic function panel  . B Nat Peptide  . EKG 12-Lead  . Echocardiogram    Disposition:   FU with me in one month.   Signed, Versie Starks, MHS 12/15/2014 1:08 PM    [facility] [address], [place], [place]  [address] Phone: [phone]; Fax: [phone]

## 2014-12-16 ENCOUNTER — Telehealth: Payer: Self-pay | Admitting: *Deleted

## 2014-12-16 DIAGNOSIS — I5032 Chronic diastolic (congestive) heart failure: Secondary | ICD-10-CM

## 2014-12-16 NOTE — Telephone Encounter (Signed)
Pt notified of lab results and to limit fluid intake to no more than 1.5 liters daily, bmet 5/20. pt agreeable to plan of care.

## 2014-12-18 ENCOUNTER — Other Ambulatory Visit: Payer: Self-pay

## 2014-12-18 ENCOUNTER — Ambulatory Visit (HOSPITAL_COMMUNITY): Payer: Medicare (Managed Care) | Attending: Cardiology

## 2014-12-18 DIAGNOSIS — I4891 Unspecified atrial fibrillation: Secondary | ICD-10-CM | POA: Insufficient documentation

## 2014-12-18 DIAGNOSIS — I214 Non-ST elevation (NSTEMI) myocardial infarction: Secondary | ICD-10-CM | POA: Diagnosis not present

## 2014-12-18 DIAGNOSIS — I5032 Chronic diastolic (congestive) heart failure: Secondary | ICD-10-CM | POA: Diagnosis not present

## 2014-12-19 ENCOUNTER — Encounter: Payer: Self-pay | Admitting: Physician Assistant

## 2014-12-22 ENCOUNTER — Telehealth (INDEPENDENT_AMBULATORY_CARE_PROVIDER_SITE_OTHER): Payer: Medicare (Managed Care) | Admitting: *Deleted

## 2014-12-22 DIAGNOSIS — I4891 Unspecified atrial fibrillation: Secondary | ICD-10-CM

## 2014-12-22 NOTE — Telephone Encounter (Signed)
lmptcb for to go over echo results and findings for med changes.

## 2014-12-23 MED ORDER — METOPROLOL SUCCINATE ER 50 MG PO TB24: 25.0000 mg | ORAL_TABLET | ORAL | Status: DC

## 2014-12-23 NOTE — Telephone Encounter (Signed)
S/w pt about echo results; pt asked for me to please s/w his sister [name]. Linda notified of echo results and to increase metoprolol tart to 50 mg BID. [name] states [facility] has pt on Toprol XL 50 mg tab and decreased pt to 25 mg 12/22/14. D/W Brynda Rim. PA. Who advised to continue Toprol XL as [facility] has directed at this time and have pt come in to office 5/20 for lab and ekg before making any other changes with metoprolol. Sister said ok and thank you for our help.

## 2014-12-24 ENCOUNTER — Telehealth: Payer: Self-pay | Admitting: Physician Assistant

## 2014-12-24 NOTE — Telephone Encounter (Signed)
New Message      Office calling to request orders for pt's lab work and EKG be faxed to their office so they can get them done for the pt. Their fax number is [phone].

## 2014-12-24 NOTE — Telephone Encounter (Signed)
Contacted Sonja at [facility], Dr Misty Stanley office to inform that the order for an EKG and to check a BMET on the pt was sent to fax number given at their office.  Reminded Sonja to have the results of the pts EKG and BMET faxed and sent to Richardson Dopp PA-C at our office for further review.  Provided Sonja of fax number and to attention the results too.  Sonja verbalized understanding and agrees with this plan.

## 2014-12-26 ENCOUNTER — Other Ambulatory Visit: Payer: Medicare (Managed Care)

## 2015-01-12 ENCOUNTER — Telehealth: Payer: Self-pay | Admitting: *Deleted

## 2015-01-12 NOTE — Telephone Encounter (Signed)
I lmom for Dr. Misty Stanley office to return my call in regards if EKG and BMET were done in May. Richardson Dopp, [place] still has not received any results. lmom [phone] tcb.

## 2015-01-12 NOTE — Telephone Encounter (Signed)
I lmom for Dr. Misty Stanley office to return my call in regards if EKG and BMET were done in May. Richardson Dopp, [place] still has not received any results. lmom [phone] tcb.

## 2015-01-13 ENCOUNTER — Other Ambulatory Visit (HOSPITAL_COMMUNITY): Payer: Self-pay | Admitting: *Deleted

## 2015-01-14 ENCOUNTER — Encounter (HOSPITAL_COMMUNITY)
Admission: RE | Admit: 2015-01-14 | Discharge: 2015-01-14 | Disposition: A | Discharge status: Home / Self Care | Payer: Medicare (Managed Care) | Source: Ambulatory Visit | Attending: Family Medicine | Admitting: Family Medicine

## 2015-01-14 DIAGNOSIS — D509 Iron deficiency anemia, unspecified: Secondary | ICD-10-CM | POA: Insufficient documentation

## 2015-01-14 MED ORDER — FERUMOXYTOL INJECTION 510 MG/17 ML
510.0000 mg | INTRAVENOUS | Status: DC
  Administered 2015-01-14: 510 mg via INTRAVENOUS
  Filled 2015-01-14: qty 17

## 2015-01-14 NOTE — Discharge Instructions (Signed)

## 2015-01-18 NOTE — Progress Notes (Signed)
Cardiology Office Note   Date:  01/19/2015   ID:  CHICO CAWOOD, DOB 01/22/1956, MRN [id number]  PCP:  [name], MD  Cardiologist:  [name]     Chief Complaint  Patient presents with  . Atrial Fibrillation  . Congestive Heart Failure     History of Present Illness: [name] is a 59 y.o. male with a hx of PAF, HTN, diabetes, HL, prior stroke, carotid stenosis, COPD, mental retardation. He was evaluated by Dr. Acie Fredrickson in 08/2012 for chest pain. Myoview was low risk. Echocardiogram in 3/14 demonstrated normal LV function with mild diastolic dysfunction. He has a history of diabetic ulcer and underwent right midfoot amputation in 04/2013.  Admitted 10/2014 with acute hypoxic respiratory failure secondary to acute on chronic diastolic CHF and atrial fibrillation with RVR in the setting of sepsis syndrome related to left lower extremity cellulitis from diabetic foot ulcer.  He had elevated troponins possibly related to demand ischemia. He also required treatment for COPD exacerbation. He converted to sinus rhythm with rate control therapy.  He was followed closely by cardiology. Given his multiple comorbid conditions, it was decided to treat him conservatively for his non-STEMI.  I saw him 12/15/14 in follow-up. Patient was residing at [facility].  I had him undergo an echocardiogram to reassess his LV function given his recent non-STEMI. This demonstrated normal LV function and mild LVH. Patient was noted to be in rapid atrial fibrillation during the study. We contacted the patient to increase the dose of his beta blocker. However, his primary care physician had recently decreased his beta blocker. Reasons for changing his beta blocker dose was not entirely clear. Follow-up EKG at his primary care physician's office 01/13/15 demonstrated normal sinus rhythm. He returns for follow-up.    Studies/Reports Reviewed Today:  Echo 12/18/14 - Mild LVH. Indeterminant  diastolicfunction (atrial fibrillation). EF 55%.  - Aortic valve: There was no stenosis. - Mitral valve: Mildly calcified annulus. There was trivialregurgitation. - Left atrium: The atrium was mildly dilated. - Right ventricle: The cavity size was normal. Systolic functionwas normal. Impressions:- The patient was in rapid atrial fibrillation. Normal LV size withmild LV hypertrophy. EF 55%. Normal RV size and systolicfunction. No significant valvular abnormalities.  Echo 10/16/12 - Mild concentric hypertrophy. Systolic function was vigorous.  EF 65% to 70%. Wall motion was normal; Grade 1diastolic dysfunction.  - Left atrium: The atrium was normal in size.  Nuclear study 09/06/12 Myoview scan with small apical defect Seen in horizontal images only. Cannot exclude tiny region of apical ischemia vs shift soft tissue. Otherwise normal perfusion. Overall low risk scan.  LV Ejection Fraction: 69%. LV Wall Motion: NL LV Function; NL Wall Motion  Carotid US 8/33 RICA 82-50%; LICA 53-97%  Past Medical History  Diagnosis Date  . Hypertension   . Diabetes mellitus   . Mental retardation     Sister helps to take care of him and takes him to appts  . Tobacco user     Smokes 1ppd for multiple years.  Quit after hosp 09/2010.  Marland Kitchen Anemia     History of Iron Def Anemia  . Anxiety   . Hyperlipidemia   . Atrial fibrillation 06/2009    CHADS score 2 (HTN, DM), was not on coumadin, but now on Pradaxa for Afib  . Lung nodule   . Chronic low back pain   . CVA (cerebral infarction) 09/2010    Bilateral with Left > Right  . Abdominal hernia  Chronic, not a good surgical candidate  . Carotid artery occlusion   . Obesity   . COPD (chronic obstructive pulmonary disease)   . Abscess, abdomen 12/31/2010    Referred to Wound Care in 01/2011 because of multiple abd abscess with VERY large ventral hernia (please look at image of CT abd/pelvis 09/2010).  Because of hernia I was hesitant to I&D.        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 Stroke     right hand numbness  . GERD (gastroesophageal reflux disease)   . Pneumonia     hx of  . Shortness of breath     with ambulation  . Edema of both legs     takes Lasix  . Numbness and tingling in hands   . Itching     all over body; pt scratches and has sores on bilateral arms and abdomen  . PONV (postoperative nausea and vomiting)   . Mental retardation   . Hx of echocardiogram     Echo 5/16:  Mild LVH, EF 55%, indeterm. diast function, WMA could not be ruled out, MAC, trivial MR, mild LAE, normal RVF    Past Surgical History  Procedure Laterality Date  . Carotid arteriogram  10/2010    30% right ICA stenosis, 40% left ICA stenosis   . Transesophageal echocardiogram  09/2010    No ASD or PFO. EF 60-65%.  Normal systolic function. No evidence of thrombus.   . Transthoracic echocardiogram  09/2010     The cavity size was normal. Systolic function was vigorous.  EF 65-70%.  Normal wall funciton.   . Debridment of decubitus ulcer Right 02/13/2013  . I&d extremity Right 02/13/2013    Procedure: IRRIGATION AND DEBRIDEMENT FOOT ULCER;  Surgeon: Johnny Bridge, MD;  Location: [place];  Service: Orthopedics;  Laterality: Right;  PULSE LAVAGE  . Cataract extraction, bilateral    . Arm surgery Left     as a child  . Amputation Right 03/29/2013    Procedure: AMPUTATION RAY;  Surgeon: Newt Minion, MD;  Location: [facility];  Service: Orthopedics;  Laterality: Right;  Right Foot 3rd and Possible 4th Ray Amputation  . Multiple tooth extractions    . Amputation Right 04/23/2013    Procedure: AMPUTATION RIGHT MID-FOOT;  Surgeon: Newt Minion, MD;  Location: [facility];  Service: Orthopedics;  Laterality: Right;  . Esophagogastroduodenoscopy (egd) with propofol N/A 06/10/2014    Procedure: ESOPHAGOGASTRODUODENOSCOPY (EGD) WITH PROPOFOL;  Surgeon: Jerene Bears, MD;  Location: [facility];  Service: Gastroenterology;  Laterality:  N/A;  . Colonoscopy with propofol N/A 06/10/2014    Procedure: COLONOSCOPY WITH PROPOFOL;  Surgeon: Jerene Bears, MD;  Location: [facility];  Service: Gastroenterology;  Laterality: N/A;  . Givens capsule study N/A 07/09/2014    Procedure: GIVENS CAPSULE STUDY;  Surgeon: Jerene Bears, MD;  Location: [facility];  Service: Gastroenterology;  Laterality: N/A;     Current Outpatient Prescriptions  Medication Sig Dispense Refill  . alum & mag hydroxide-simeth (MAALOX/MYLANTA) 200-200-20 MG/5ML suspension Take 30 mLs by mouth every 6 (six) hours as needed for indigestion or heartburn. 355 mL 0  . aspirin EC 81 MG EC tablet Take 1 tablet (81 mg total) by mouth daily. 90 tablet 3  . atorvastatin (LIPITOR) 80 MG tablet Take 1 tablet (80 mg total) by mouth daily at 6 PM. 45 tablet 0  . Cholecalciferol (VITAMIN D3) 1000 UNITS CAPS Take  1,000 Units by mouth daily.    . clonazePAM (KLONOPIN) 0.5 MG tablet Take 1 tablet (0.5 mg total) by mouth at bedtime. 30 tablet 0  . dabigatran (PRADAXA) 150 MG CAPS Take 150 mg by mouth every 12 (twelve) hours.    Marland Kitchen diltiazem (CARDIZEM CD) 180 MG 24 hr capsule Take 1 capsule (180 mg total) by mouth daily. 30 capsule 0  . doxycycline (VIBRA-TABS) 100 MG tablet Take 1 tablet (100 mg total) by mouth every 12 (twelve) hours. 4 tablet 0  . ferrous gluconate (FERGON) 324 MG tablet Take 1 tablet (324 mg total) by mouth 2 (two) times daily with a meal. 60 tablet 0  . furosemide (LASIX) 80 MG tablet Take 1 tablet (80 mg total) by mouth 2 (two) times daily. 60 tablet 0  . gabapentin (NEURONTIN) 300 MG capsule Take 1 capsule (300 mg total) by mouth 2 (two) times daily. 60 capsule 0  . HYDROcodone-acetaminophen (NORCO/VICODIN) 5-325 MG per tablet 1 by mouth once daily as needed for pain control 30 tablet 0  . hydrocortisone 2.5 % ointment Apply topically 3 (three) times daily. To leg around wound. Do not apply to broken skin. 30 g 0  . insulin glargine (LANTUS) 100 UNIT/ML  injection Inject 40 Units into the skin at bedtime.     . isosorbide mononitrate (IMDUR) 30 MG 24 hr tablet Take 30 mg by mouth every morning.    . magnesium oxide (MAG-OX) 400 MG tablet Take 400 mg by mouth daily.    . metFORMIN (GLUCOPHAGE) 1000 MG tablet Take 1,000 mg by mouth 2 (two) times daily with a meal.    . metoprolol succinate (TOPROL-XL) 50 MG 24 hr tablet Take 1 tablet (50 mg total) by mouth as directed. Take 1/2 tab daily per [facility] 12/22/14; Take with or immediately following a meal.    . nitroGLYCERIN (NITROSTAT) 0.4 MG SL tablet Place 0.4 mg under the tongue every 5 (five) minutes as needed for chest pain.     Marland Kitchen omeprazole (PRILOSEC) 20 MG capsule Take 20 mg by mouth daily.    Marland Kitchen PARoxetine (PAXIL) 40 MG tablet Take 40 mg by mouth every morning.    . predniSONE (DELTASONE) 20 MG tablet Take 2 tabs daily for 2 days, then 1 tab daily x 2 days, then half tab daily x 2 days then stop 7 tablet 0  . saccharomyces boulardii (FLORASTOR) 250 MG capsule Take 1 capsule (250 mg total) by mouth 2 (two) times daily. 60 capsule 2  . senna-docusate (SENOKOT-S) 8.6-50 MG per tablet Take 2 tablets by mouth at bedtime as needed for mild constipation.     No current facility-administered medications for this visit.    Allergies:   Penicillins    Social History:  The patient  reports that he quit smoking about 4 years ago. His smoking use included Cigarettes. He has a 42 pack-year smoking history. He quit smokeless tobacco use about 4 years ago. He reports that he drinks alcohol. He reports that he does not use illicit drugs.   Family History:  The patient's family history includes Breast cancer in his maternal grandmother; Heart attack in his maternal grandmother; Heart disease in his brother, father, maternal grandmother, mother, and sister; Hypertension in his sister; Peripheral vascular disease in his father; Stomach cancer in his maternal uncle. There is no history of Colon cancer or Stroke.     ROS:   Please see the history of present illness.   Review of Systems  All other systems reviewed and are negative.    PHYSICAL EXAM: VS:  There were no vitals taken for this visit.    Wt Readings from Last 3 Encounters:  01/14/15 208 lb (94.348 kg)  12/15/14 213 lb (96.616 kg)  11/03/14 225 lb 15.5 oz (102.5 kg)     GEN: Chronically ill-appearing male sitting in a wheelchair in no acute distress HEENT: normal Neck: no JVD at 90, no masses Cardiac:  Normal S1/S2, RRR; no murmur ,  no rubs or gallops, tight 1-2+ bilateral LE Respiratory:  Coarse breath sounds at the bases bilaterally, no wheezing. GI: Large ventral hernia noted, + BS MS: no deformity or atrophy Skin: warm and dry  Neuro:  CNs II-XII intact, Strength and sensation are intact Psych: Normal affect   EKG:  EKG is ordered today.  It demonstrates:     Recent Labs: 10/26/2014: B Natriuretic Peptide 345.2* 11/01/2014: Magnesium 2.5 11/03/2014: Hemoglobin 10.3*; Platelets 329 12/15/2014: ALT 14; BUN 43*; Creatinine, Ser 1.29; Potassium 3.9; Pro B Natriuretic peptide (BNP) 290.0*; Sodium 130*  Labs at PCP office 01/07/15:  Hgb 8.5, Creatinine 1.38, BUN 56, K 4.2, Na 131, Glucose 509   Lipid Panel    Component Value Date/Time   CHOL 124 11/10/2012 0255   TRIG 112 11/10/2012 0255   HDL 24* 11/10/2012 0255   CHOLHDL 5.2 11/10/2012 0255   VLDL 22 11/10/2012 0255   LDLCALC 78 11/10/2012 0255   LDLDIRECT 54 08/23/2012 1447      ASSESSMENT AND PLAN:  Chronic diastolic CHF (congestive heart failure):   Recent creatinine stable.   Paroxysmal atrial fibrillation:  Maintaining NSR. He remains on Pradaxa for anticoagulation.  He was noted to have recurrent AFib during his echo. FU ECG at his PCP's office last week demonstrated NSR.   NSTEMI (non-ST elevated myocardial infarction):  The patient had a significant elevation in his troponin during his recent hospitalization. He has inferior Q waves on ECG. As noted  during his hospitalization, he is not felt to be a candidate for aggressive, invasive evaluation. Recent echo with normal LVF.  Continue aspirin, statin.    Essential hypertension:  Controlled.  HYPERCHOLESTEROLEMIA:  Continue statin.    Carotid stenosis, bilateral:  Follow-up with vascular surgery.  Ventral hernia, recurrence not specified:  He is not a surgical candidate.    Current medicines are reviewed at length with the patient today.  Concerns regarding medicines are as outlined above.  The following changes have been made:    As above    Labs/ tests ordered today include:  No orders of the defined types were placed in this encounter.    Disposition:   FU    Signed, Richardson Dopp, PA-C, MHS 01/19/2015 2:20 PM    [facility] [address], [place], [place]  [address] Phone: [phone]; Fax: [phone]    This encounter was created in error - please disregard.

## 2015-01-19 ENCOUNTER — Encounter: Payer: Medicare (Managed Care) | Admitting: Physician Assistant

## 2015-01-19 NOTE — Telephone Encounter (Signed)
Records for bmet, ekg rcv'd today for pt's appt at 2 pm with Brynda Rim. PA

## 2015-01-27 ENCOUNTER — Other Ambulatory Visit (HOSPITAL_COMMUNITY): Payer: Self-pay

## 2015-01-28 ENCOUNTER — Encounter (HOSPITAL_COMMUNITY)
Admission: RE | Admit: 2015-01-28 | Discharge: 2015-01-28 | Disposition: A | Discharge status: Home / Self Care | Payer: Medicare (Managed Care) | Source: Ambulatory Visit | Attending: Family Medicine | Admitting: Family Medicine

## 2015-01-28 DIAGNOSIS — D509 Iron deficiency anemia, unspecified: Secondary | ICD-10-CM | POA: Diagnosis not present

## 2015-01-28 MED ORDER — SODIUM CHLORIDE 0.9 % IV SOLN
510.0000 mg | INTRAVENOUS | Status: DC
  Administered 2015-01-28: 510 mg via INTRAVENOUS
  Filled 2015-01-28: qty 17

## 2015-02-16 ENCOUNTER — Encounter: Payer: Self-pay | Admitting: Internal Medicine

## 2015-04-07 ENCOUNTER — Encounter: Payer: Self-pay | Admitting: Vascular Surgery

## 2015-04-08 ENCOUNTER — Ambulatory Visit (HOSPITAL_COMMUNITY)
Admission: RE | Admit: 2015-04-08 | Discharge: 2015-04-08 | Disposition: A | Discharge status: Home / Self Care | Payer: Medicare (Managed Care) | Source: Ambulatory Visit | Attending: Vascular Surgery | Admitting: Vascular Surgery

## 2015-04-08 ENCOUNTER — Encounter: Payer: Self-pay | Admitting: Vascular Surgery

## 2015-04-08 ENCOUNTER — Telehealth: Payer: Self-pay | Admitting: Cardiovascular Disease

## 2015-04-08 ENCOUNTER — Ambulatory Visit (INDEPENDENT_AMBULATORY_CARE_PROVIDER_SITE_OTHER): Payer: Medicare (Managed Care) | Admitting: Vascular Surgery

## 2015-04-08 ENCOUNTER — Ambulatory Visit (INDEPENDENT_AMBULATORY_CARE_PROVIDER_SITE_OTHER)
Admission: RE | Admit: 2015-04-08 | Discharge: 2015-04-08 | Disposition: A | Discharge status: Home / Self Care | Payer: Medicare (Managed Care) | Source: Ambulatory Visit | Attending: Vascular Surgery | Admitting: Vascular Surgery

## 2015-04-08 VITALS — BP 117/60 | HR 76 | Temp 97.6°F | Resp 16 | Ht 60.0 in | Wt 210.0 lb

## 2015-04-08 DIAGNOSIS — E119 Type 2 diabetes mellitus without complications: Secondary | ICD-10-CM | POA: Diagnosis not present

## 2015-04-08 DIAGNOSIS — F17200 Nicotine dependence, unspecified, uncomplicated: Secondary | ICD-10-CM | POA: Diagnosis not present

## 2015-04-08 DIAGNOSIS — E785 Hyperlipidemia, unspecified: Secondary | ICD-10-CM | POA: Diagnosis not present

## 2015-04-08 DIAGNOSIS — I6523 Occlusion and stenosis of bilateral carotid arteries: Secondary | ICD-10-CM

## 2015-04-08 DIAGNOSIS — I10 Essential (primary) hypertension: Secondary | ICD-10-CM | POA: Diagnosis not present

## 2015-04-08 DIAGNOSIS — I739 Peripheral vascular disease, unspecified: Secondary | ICD-10-CM | POA: Insufficient documentation

## 2015-04-08 NOTE — Telephone Encounter (Signed)
Request for surgical clearance:  1. What type of surgery is being performed? CEA  2. When is this surgery scheduled? Pending Clearance  3. Are there any medications that need to be held prior to surgery and how long? Radaxa , and also needing cardiac clearance  4. Name of physician performing surgery? Dr Joylene Igo 5. What is your office phone and fax number? Phone  [phone] and Fax [phone] 6.

## 2015-04-08 NOTE — Addendum Note (Signed)
Addended by: Dorthula Rue L on: 04/08/2015 04:59 PM   Modules accepted: Orders

## 2015-04-08 NOTE — Telephone Encounter (Signed)
Will forward to Dr. Acie Fredrickson and his nurse, Sharyn Lull, for review, advisement and follow up.

## 2015-04-08 NOTE — Progress Notes (Signed)
[facility]  Patient name: [name] MRN: [id number] DOB: 1956-06-01 Sex: male  REASON FOR VISIT: Follow up of bilateral carotid stenoses.  HPI: [name] is a 59 y.o. male who I last saw her in March 2016. At that time, he had a 60-79% left carotid stenosis. He was on aspirin and a statin. He was also on per Pradaxa up for atrial fibrillation. He was set up for 6 month follow up visit. Of note, in reviewing his records, he was admitted in March with congestive heart failure and had a non-ST MI at that time. He does admit to some occasional chest pain and left shoulder pain. This is relieved with nitroglycerin.   He does note that occasionally the left arm gives out on him but this is been happening for years and the symptoms actually improved recently. I do not get any clear-cut history of recent stroke, TIAs, expressive or receptive aphasia, or amaurosis fugax.  He is on aspirin and is on a statin. He is on Pradaxa for paroxysmal atrial fibrillation.  Past Medical History  Diagnosis Date  . Hypertension   . Diabetes mellitus   . Mental retardation     Sister helps to take care of him and takes him to appts  . Tobacco user     Smokes 1ppd for multiple years.  Quit after hosp 09/2010.  Marland Kitchen Anemia     History of Iron Def Anemia  . Anxiety   . Hyperlipidemia   . Atrial fibrillation 06/2009    CHADS score 2 (HTN, DM), was not on coumadin, but now on Pradaxa for Afib  . Lung nodule   . Chronic low back pain   . CVA (cerebral infarction) 09/2010    Bilateral with Left > Right  . Abdominal hernia     Chronic, not a good surgical candidate  . Carotid artery occlusion   . Obesity   . COPD (chronic obstructive pulmonary disease)   . Abscess, abdomen 12/31/2010    Referred to Wound Care in 01/2011 because of multiple abd abscess with VERY large ventral hernia (please look at image of CT abd/pelvis 09/2010).  Because of hernia I was hesitant to I&D.       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 Stroke     right hand numbness  . GERD (gastroesophageal reflux disease)   . Pneumonia     hx of  . Shortness of breath     with ambulation  . Edema of both legs     takes Lasix  . Numbness and tingling in hands   . Itching     all over body; pt scratches and has sores on bilateral arms and abdomen  . PONV (postoperative nausea and vomiting)   . Mental retardation   . Hx of echocardiogram     Echo 5/16:  Mild LVH, EF 55%, indeterm. diast function, WMA could not be ruled out, MAC, trivial MR, mild LAE, normal RVF  . Myocardial infarction 2016 ?    Heart attack  (  Per  pt. )   Family History  Problem Relation Age of Onset  . Heart disease Mother   . Hypertension Sister   . Heart disease Sister   . Heart disease Brother   . Heart disease Father   . Peripheral vascular disease Father   . Heart disease Maternal Grandmother   . Heart attack Maternal Grandmother   .  Colon cancer Neg Hx   . Breast cancer Maternal Grandmother   . Stomach cancer Maternal Uncle   . Stroke Neg Hx    SOCIAL HISTORY: Social History  Substance Use Topics  . Smoking status: Former Smoker -- 1.00 packs/day for 42 years    Types: Cigarettes    Quit date: 11/10/2010  . Smokeless tobacco: Former Systems developer    Quit date: 10/02/2010  . Alcohol Use: 0.0 oz/week    0 Standard drinks or equivalent per week     Comment: rare   Allergies  Allergen Reactions  . Penicillins Hives and Nausea And Vomiting   Current Outpatient Prescriptions  Medication Sig Dispense Refill  . alum & mag hydroxide-simeth (MAALOX/MYLANTA) 200-200-20 MG/5ML suspension Take 30 mLs by mouth every 6 (six) hours as needed for indigestion or heartburn. 355 mL 0  . aspirin EC 81 MG EC tablet Take 1 tablet (81 mg total) by mouth daily. 90 tablet 3  . atorvastatin (LIPITOR) 80 MG tablet Take 1 tablet (80 mg total) by mouth daily at 6 PM. 45 tablet 0  . Cholecalciferol  (VITAMIN D3) 1000 UNITS CAPS Take 1,000 Units by mouth daily.    . clonazePAM (KLONOPIN) 0.5 MG tablet Take 1 tablet (0.5 mg total) by mouth at bedtime. 30 tablet 0  . dabigatran (PRADAXA) 150 MG CAPS Take 150 mg by mouth every 12 (twelve) hours.    Marland Kitchen diltiazem (CARDIZEM CD) 180 MG 24 hr capsule Take 1 capsule (180 mg total) by mouth daily. 30 capsule 0  . ferrous gluconate (FERGON) 324 MG tablet Take 1 tablet (324 mg total) by mouth 2 (two) times daily with a meal. 60 tablet 0  . furosemide (LASIX) 80 MG tablet Take 1 tablet (80 mg total) by mouth 2 (two) times daily. 60 tablet 0  . gabapentin (NEURONTIN) 300 MG capsule Take 1 capsule (300 mg total) by mouth 2 (two) times daily. 60 capsule 0  . HYDROcodone-acetaminophen (NORCO/VICODIN) 5-325 MG per tablet 1 by mouth once daily as needed for pain control 30 tablet 0  . hydrocortisone 2.5 % ointment Apply topically 3 (three) times daily. To leg around wound. Do not apply to broken skin. 30 g 0  . insulin glargine (LANTUS) 100 UNIT/ML injection Inject 40 Units into the skin at bedtime.     . isosorbide mononitrate (IMDUR) 30 MG 24 hr tablet Take 30 mg by mouth every morning.    . magnesium oxide (MAG-OX) 400 MG tablet Take 400 mg by mouth daily.    . metFORMIN (GLUCOPHAGE) 1000 MG tablet Take 1,000 mg by mouth 2 (two) times daily with a meal.    . metoprolol succinate (TOPROL-XL) 50 MG 24 hr tablet Take 1 tablet (50 mg total) by mouth as directed. Take 1/2 tab daily per [facility] 12/22/14; Take with or immediately following a meal.    . nitroGLYCERIN (NITROSTAT) 0.4 MG SL tablet Place 0.4 mg under the tongue every 5 (five) minutes as needed for chest pain.     Marland Kitchen omeprazole (PRILOSEC) 20 MG capsule Take 20 mg by mouth daily.    Marland Kitchen PARoxetine (PAXIL) 40 MG tablet Take 40 mg by mouth every morning.    . saccharomyces boulardii (FLORASTOR) 250 MG capsule Take 1 capsule (250 mg total) by mouth 2 (two) times daily. 60 capsule 2  . senna-docusate (SENOKOT-S)  8.6-50 MG per tablet Take 2 tablets by mouth at bedtime as needed for mild constipation.    Marland Kitchen doxycycline (VIBRA-TABS) 100 MG tablet  Take 1 tablet (100 mg total) by mouth every 12 (twelve) hours. (Patient not taking: Reported on 04/08/2015) 4 tablet 0  . predniSONE (DELTASONE) 20 MG tablet Take 2 tabs daily for 2 days, then 1 tab daily x 2 days, then half tab daily x 2 days then stop (Patient not taking: Reported on 04/08/2015) 7 tablet 0   No current facility-administered medications for this visit.   REVIEW OF SYSTEMS: Valu.Nieves ] denotes positive finding; [  ] denotes negative finding  CARDIOVASCULAR:  Valu.Nieves ] chest pain   [ ]  chest pressure   [ ]  palpitations   [ ]  orthopnea   Valu.Nieves ] dyspnea on exertion   [ ]  claudication   [ ]  rest pain   [ ]  DVT   [ ]  phlebitis PULMONARY:   [ ]  productive cough   [ ]  asthma   [ ]  wheezing NEUROLOGIC:   [ ]  weakness  [ ]  paresthesias  [ ]  aphasia  [ ]  amaurosis  [ ]  dizziness HEMATOLOGIC:   [ ]  bleeding problems   [ ]  clotting disorders MUSCULOSKELETAL:  Valu.Nieves ] joint pain   [ ]  joint swelling [ ]  leg swelling GASTROINTESTINAL: [ ]   blood in stool  [ ]   hematemesis GENITOURINARY:  [ ]   dysuria  [ ]   hematuria PSYCHIATRIC:  [ ]  history of major depression INTEGUMENTARY:  [ ]  rashes  [ ]  ulcers CONSTITUTIONAL:  [ ]  fever   [ ]  chills  PHYSICAL EXAM: Filed Vitals:   04/08/15 1413 04/08/15 1416  BP: 114/51 117/60  Pulse: 77 76  Temp:  97.6 F (36.4 C)  TempSrc:  Oral  Resp:  16  Height:  5' (1.524 m)  Weight:  210 lb (95.255 kg)  SpO2:  99%   GENERAL: The patient is a well-nourished male, in no acute distress. The vital signs are documented above. CARDIAC: There is a regular rate and rhythm.  VASCULAR: I do not detect carotid bruits. I cannot palpate pedal pulses although both feet are warm and well-perfused. PULMONARY: There is good air exchange bilaterally without wheezing or rales. ABDOMEN: Soft and non-tender with normal pitched bowel sounds.    MUSCULOSKELETAL: he has a transmetatarsal amputation on the right. NEUROLOGIC: No focal weakness or paresthesias are detected. SKIN: There are no ulcers or rashes noted. PSYCHIATRIC: The patient has a normal affect.  DATA:  I have independently interpreted his carotid duplex scan today which shows that he has a greater than 80% left carotid stenosis with a 60-79% right carotid stenosis.   MEDICAL ISSUES:  ASYMPTOMATIC GREATER THAN 80% LEFT CAROTID STENOSIS: Given the progression of the left carotid stenosis to greater than 80%, I have recommended left carotid endarterectomy in order to lower his risk of future stroke. I have reviewed the indications for carotid endarterectomy. I have also reviewed the potential complications of surgery, including but not limited to: bleeding, stroke (perioperative risk 1-2%), MI, nerve injury of other unpredictable medical problems. All of the patients questions were answered and they are agreeable to proceed with surgery.   However, given his hospitalization in March of this year with a non-ST MI and his occasional chest pain I think he needs preoperative cardiac clearance prior to scheduling surgery. His cardiologist is [name] and we will make that appointment. In addition, he is on Pradaxa and this will need to be held for 3 days prior to surgery (his GFR on 11/03/2014 was 67). We can schedule his surgery once  he is cleared from a cardiac standpoint.    [name] [facility]: [phone]

## 2015-04-09 NOTE — Telephone Encounter (Signed)
Patient needs FU. Please try to arrange with me on day [name] in the office. Richardson Dopp, PA-C   04/09/2015 1:41 PM

## 2015-04-09 NOTE — Telephone Encounter (Signed)
I s/w pt today and advised that he will need to have an appt before we can clear him for his surgery. Pt scheduled for 04/16/15 w/Scott W. PA same day Dr. Acie Fredrickson is in the office. I also lmom for Juliann Pulse surg scheduler at [facility] of appt 04/16/15. Any questions cb. Pt has asked for me to call [facility] for his transportation.

## 2015-04-09 NOTE — Telephone Encounter (Signed)
[name], You saw this patient in May. I have not seen him since Jan. 2014. Will you address this or do we need to make him appt to see me Thanks

## 2015-04-09 NOTE — Telephone Encounter (Signed)
Follow up   Request for surgical clearance:  1. What type of surgery is being performed? CEA  2. When is this surgery scheduled? Pending Clearance  3. Are there any medications that need to be held prior to surgery and how long? Radaxa , and also needing cardiac clearance  4. Name of physician performing surgery? Dr Joylene Igo 5. What is your office phone and fax number? Phone [phone] and Fax [phone]

## 2015-04-10 ENCOUNTER — Other Ambulatory Visit: Payer: Self-pay | Admitting: *Deleted

## 2015-04-15 NOTE — Progress Notes (Signed)
Cardiology Office Note   Date:  04/16/2015   ID:  [name], DOB 08/05/1956, MRN [id number]  PCP:  Joseph Maroon, MD  Cardiologist:  Dr. Liam [name]   Vascular Surgeon:  Dr. Scot [name]   Chief Complaint  Patient presents with  . Other    Surgical Clearance  . Coronary Artery Disease     History of Present Illness: [name] is a 59 y.o. male with a hx of PAF, HTN, diabetes, HL, prior stroke, carotid stenosis, COPD, mental retardation. He was evaluated by Dr. Acie [name] in 08/2012 for chest pain. Myoview was low risk. Echocardiogram in 3/14 demonstrated normal LV function with mild diastolic dysfunction. He has a history of diabetic ulcer and underwent right midfoot amputation in 04/2013.  Admitted 3/20-3/28 with acute hypoxic respiratory failure secondary to acute on chronic diastolic CHF and atrial fibrillation with RVR in the setting of sepsis syndrome related to left lower extremity cellulitis from diabetic foot ulcer.  He had elevated troponins possibly related to demand ischemia. He also required treatment for COPD exacerbation. He converted to sinus rhythm with rate control therapy.  He was followed closely by cardiology. Given his multiple comorbid conditions, it was decided to treat him conservatively for his non-STEMI.  Last seen in clinic 5/16. Volume is stable. Conservative management was recommended for his recent non-STEMI. Follow-up echocardiogram did demonstrate normal LV function. Wall motion abnormalities could not be excluded. Patient was noted to be in atrial fibrillation at the time of his echocardiogram. Beta blocker was adjusted. Follow-up ECG through [facility] demonstrated normal sinus rhythm.   He has recently seen Dr. Scot [name] with vascular surgery. He has progressive carotid artery stenosis on the left and needs to undergo carotid endarterectomy. He returns for surgical clearance. He does exercise on a stationary bike at [place]. He denies any  exertional chest pain. He does occasionally get left-sided chest discomfort while seated that seems to improve with changes in positioning. He does have dyspnea with exertion. He is NYHA 2-2b. He walks with a walker.  He sleeps on one to 2 pillows. He denies PND. LE edema is much improved. He denies syncope. He does cough. This is mild and nonproductive. He has occasional hemorrhoidal bleeding. He denies hemoptysis or hematemesis.   Studies/Reports Reviewed Today:  Carotid US 8/16 R 60-79% L 80-99%  Echo 12/18/14 Mild LVH, EF 55%, wall motion abnormalities could not be excluded, MAC, trivial MR, mild LAE  Echo 10/16/12 Mild LVH, vigorous LVF, EF 65-70%, normal wall motion, grade 1 diastolic dysfunction  Nuclear study 09/06/12 Myoview scan with small apical defect Seen in horizontal images only. Cannot exclude tiny region of apical ischemia vs shift soft tissue. Otherwise normal perfusion. Overall low risk scan.  LV Ejection Fraction: 69%. LV Wall Motion: NL LV Function; NL Wall Motion  Carotid US 2/44 RICA 01-02%; LICA 72-53%   Past Medical History  Diagnosis Date  . Hypertension   . Diabetes mellitus   . Mental retardation     Sister helps to take care of him and takes him to appts  . Tobacco user     Smokes 1ppd for multiple years.  Quit after hosp 09/2010.  Marland Kitchen Anemia     History of Iron Def Anemia  . Anxiety   . Hyperlipidemia   . Atrial fibrillation 06/2009    CHADS score 2 (HTN, DM), was not on coumadin, but now on Pradaxa for Afib  . Lung nodule   . Chronic  low back pain   . CVA (cerebral infarction) 09/2010    Bilateral with Left > Right  . Abdominal hernia     Chronic, not a good surgical candidate  . Carotid artery occlusion   . Obesity   . COPD (chronic obstructive pulmonary disease)   . Abscess, abdomen 12/31/2010    Referred to Wound Care in 01/2011 because of multiple abd abscess with VERY large ventral hernia (please look at image of CT abd/pelvis 09/2010).   Because of hernia I was hesitant to I&D.      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 Stroke     right hand numbness  . GERD (gastroesophageal reflux disease)   . Pneumonia     hx of  . Shortness of breath     with ambulation  . Edema of both legs     takes Lasix  . Numbness and tingling in hands   . Itching     all over body; pt scratches and has sores on bilateral arms and abdomen  . PONV (postoperative nausea and vomiting)   . Mental retardation   . Hx of echocardiogram     Echo 5/16:  Mild LVH, EF 55%, indeterm. diast function, WMA could not be ruled out, MAC, trivial MR, mild LAE, normal RVF  . Myocardial infarction 2016 ?    Heart attack  (  Per  pt. )    Past Surgical History  Procedure Laterality Date  . Carotid arteriogram  10/2010    30% right ICA stenosis, 40% left ICA stenosis   . Transesophageal echocardiogram  09/2010    No ASD or PFO. EF 60-65%.  Normal systolic function. No evidence of thrombus.   . Transthoracic echocardiogram  09/2010     The cavity size was normal. Systolic function was vigorous.  EF 65-70%.  Normal wall funciton.   . Debridment of decubitus ulcer Right 02/13/2013  . I&d extremity Right 02/13/2013    Procedure: IRRIGATION AND DEBRIDEMENT FOOT ULCER;  Surgeon: Joseph Bridge, MD;  Location: [facility];  Service: Orthopedics;  Laterality: Right;  PULSE LAVAGE  . Cataract extraction, bilateral    . Arm surgery Left     as a child  . Amputation Right 03/29/2013    Procedure: AMPUTATION RAY;  Surgeon: Joseph Minion, MD;  Location: [facility];  Service: Orthopedics;  Laterality: Right;  Right Foot 3rd and Possible 4th Ray Amputation  . Multiple tooth extractions    . Amputation Right 04/23/2013    Procedure: AMPUTATION RIGHT MID-FOOT;  Surgeon: Joseph Minion, MD;  Location: [facility];  Service: Orthopedics;  Laterality: Right;  . Esophagogastroduodenoscopy (egd) with propofol N/A 06/10/2014    Procedure: ESOPHAGOGASTRODUODENOSCOPY  (EGD) WITH PROPOFOL;  Surgeon: Joseph Bears, MD;  Location: [facility];  Service: Gastroenterology;  Laterality: N/A;  . Colonoscopy with propofol N/A 06/10/2014    Procedure: COLONOSCOPY WITH PROPOFOL;  Surgeon: Joseph Bears, MD;  Location: [facility];  Service: Gastroenterology;  Laterality: N/A;  . Givens capsule study N/A 07/09/2014    Procedure: GIVENS CAPSULE STUDY;  Surgeon: Joseph Bears, MD;  Location: [facility];  Service: Gastroenterology;  Laterality: N/A;     Current Outpatient Prescriptions  Medication Sig Dispense Refill  . alum & mag hydroxide-simeth (MAALOX/MYLANTA) 200-200-20 MG/5ML suspension Take 30 mLs by mouth every 6 (six) hours as needed for indigestion or heartburn. 355 mL 0  . aspirin EC 81 MG EC tablet  Take 1 tablet (81 mg total) by mouth daily. 90 tablet 3  . atorvastatin (LIPITOR) 80 MG tablet Take 1 tablet (80 mg total) by mouth daily at 6 PM. 45 tablet 0  . Cholecalciferol (VITAMIN D3) 1000 UNITS CAPS Take 1,000 Units by mouth daily.    . clonazePAM (KLONOPIN) 0.5 MG tablet Take 1 tablet (0.5 mg total) by mouth at bedtime. 30 tablet 0  . dabigatran (PRADAXA) 150 MG CAPS Take 150 mg by mouth every 12 (twelve) hours.    Marland Kitchen diltiazem (CARDIZEM CD) 180 MG 24 hr capsule Take 1 capsule (180 mg total) by mouth daily. 30 capsule 0  . doxycycline (VIBRA-TABS) 100 MG tablet Take 1 tablet (100 mg total) by mouth every 12 (twelve) hours. 4 tablet 0  . ferrous gluconate (FERGON) 324 MG tablet Take 1 tablet (324 mg total) by mouth 2 (two) times daily with a meal. 60 tablet 0  . furosemide (LASIX) 80 MG tablet Take 1 tablet (80 mg total) by mouth 2 (two) times daily. 60 tablet 0  . gabapentin (NEURONTIN) 300 MG capsule Take 1 capsule (300 mg total) by mouth 2 (two) times daily. 60 capsule 0  . HYDROcodone-acetaminophen (NORCO/VICODIN) 5-325 MG per tablet 1 by mouth once daily as needed for pain control 30 tablet 0  . hydrocortisone 2.5 % ointment Apply topically 3 (three)  times daily. To leg around wound. Do not apply to broken skin. 30 g 0  . insulin glargine (LANTUS) 100 UNIT/ML injection Inject 40 Units into the skin at bedtime.     . isosorbide mononitrate (IMDUR) 30 MG 24 hr tablet Take 30 mg by mouth every morning.    . magnesium oxide (MAG-OX) 400 MG tablet Take 400 mg by mouth daily.    . metFORMIN (GLUCOPHAGE) 1000 MG tablet Take 1,000 mg by mouth 2 (two) times daily with a meal.    . metoprolol succinate (TOPROL-XL) 50 MG 24 hr tablet Take 1 tablet (50 mg total) by mouth as directed. Take 1/2 tab daily per [facility] 12/22/14; Take with or immediately following a meal.    . nitroGLYCERIN (NITROSTAT) 0.4 MG SL tablet Place 0.4 mg under the tongue every 5 (five) minutes as needed for chest pain.     Marland Kitchen omeprazole (PRILOSEC) 20 MG capsule Take 20 mg by mouth daily.    Marland Kitchen PARoxetine (PAXIL) 40 MG tablet Take 40 mg by mouth every morning.    . saccharomyces boulardii (FLORASTOR) 250 MG capsule Take 1 capsule (250 mg total) by mouth 2 (two) times daily. 60 capsule 2   No current facility-administered medications for this visit.    Allergies:   Penicillins    Social History:  The patient  reports that he quit smoking about 4 years ago. His smoking use included Cigarettes. He has a 42 pack-year smoking history. He quit smokeless tobacco use about 4 years ago. He reports that he drinks alcohol. He reports that he does not use illicit drugs.   Family History:  The patient's family history includes Breast cancer in his maternal grandmother; Heart attack in his maternal grandmother; Heart disease in his brother, father, maternal grandmother, mother, and sister; Hypertension in his sister; Peripheral vascular disease in his father; Stomach cancer in his maternal uncle. There is no history of Colon cancer or Stroke.    ROS:   Please see the history of present illness.   Review of Systems  All other systems reviewed and are negative.    PHYSICAL EXAM: VS:  BP 136/58  mmHg  Pulse 73  Ht 5' (1.524 m)  Wt 209 lb 6.4 oz (94.983 kg)  BMI 40.90 kg/m2    Wt Readings from Last 3 Encounters:  04/16/15 209 lb 6.4 oz (94.983 kg)  04/08/15 210 lb (95.255 kg)  01/14/15 208 lb (94.348 kg)     GEN: WN/WD male, in no acute distress HEENT: normal Neck: I cannot assess JVD, no masses Cardiac:  Normal S1/S2, RRR; no murmur ,  no rubs or gallops, trace bilateral LE edema Respiratory:  Decreased breath sounds bilaterally, no wheezing. GI: Large ventral hernia noted, soft, + BS MS: no deformity or atrophy Skin: warm and dry  Neuro:  CNs II-XII intact, Strength and sensation are intact Psych: Normal affect   EKG:  EKG is ordered today.  It demonstrates:   NSR, HR 73, RBBB, inferior Q waves, nonspecific ST-T wave changes, no change from prior tracing.    Recent Labs: 10/26/2014: B Natriuretic Peptide 345.2* 11/01/2014: Magnesium 2.5 11/03/2014: Hemoglobin 10.3*; Platelets 329 12/15/2014: ALT 14; BUN 43*; Creatinine, Ser 1.29; Potassium 3.9; Pro B Natriuretic peptide (BNP) 290.0*; Sodium 130*    Lipid Panel    Component Value Date/Time   CHOL 124 11/10/2012 0255   TRIG 112 11/10/2012 0255   HDL 24* 11/10/2012 0255   CHOLHDL 5.2 11/10/2012 0255   VLDL 22 11/10/2012 0255   LDLCALC 78 11/10/2012 0255   LDLDIRECT 54 08/23/2012 1447    Recent Labs  10/26/14 0329 10/26/14 1610 10/27/14 0357 10/27/14 1445 10/27/14 1957 10/27/14 2350 10/28/14 0228  TROPONINI 0.12* 5.92* 3.44* 3.41* 5.19* 4.90* 6.47*       ASSESSMENT AND PLAN:  Surgical Clearance:  The patient needs left carotid endarterectomy. He had an admission to the hospital and 3/16 with A. fib with RVR, diastolic heart failure in the setting of sepsis from diabetic foot infection. He had significantly elevated troponin levels ruling him in for a non-STEMI. LV function has remained stable. He does have occasional atypical chest pain. I reviewed his case with Dr. Acie [name]. We will arrange a Lexiscan  Myoview for risk stratification. Further recommendations to follow.  At best, he will be moderate risk for CV complications in the perioperative period.    Chronic diastolic CHF (congestive heart failure):  Volume appears to be stable. Continue current Rx.  Close attention will need to be paid to volume status at the time of his surgery.    Paroxysmal atrial fibrillation:  Maintaining NSR. He remains on Pradaxa for anticoagulation. He has a hx of CVA.  His Creatinine Clearance is 76.  I reviewed his case with our PharmD.  He should hold Pradaxa for 2 days prior to surgery.  He can resume post op as soon as it is safe from a surgical perspective.     S/p NSTEMI:  Continue ASA, beta-blocker, statin.  Essential hypertension:  Controlled.  HYPERCHOLESTEROLEMIA:  Continue statin.    Carotid stenosis, bilateral:  L CEA pending.       Current medicines are reviewed at length with the patient today.  Concerns regarding medicines are as outlined above.  The following changes have been made:    None    Labs/ tests ordered today include:  Orders Placed This Encounter  Procedures  . Myocardial Perfusion Imaging  . EKG 12-Lead    Disposition:   FU with Dr. Liam [name] 3 mos.    Signed, Richardson Dopp, PA-C, MHS 04/16/2015 1:34 PM    [facility]  [address] [place],   [address] Phone: [phone]; Fax: [phone]

## 2015-04-16 ENCOUNTER — Encounter: Payer: Self-pay | Admitting: Physician Assistant

## 2015-04-16 ENCOUNTER — Ambulatory Visit (INDEPENDENT_AMBULATORY_CARE_PROVIDER_SITE_OTHER): Payer: Medicare (Managed Care) | Admitting: Physician Assistant

## 2015-04-16 VITALS — BP 136/58 | HR 73 | Ht 60.0 in | Wt 209.4 lb

## 2015-04-16 DIAGNOSIS — R079 Chest pain, unspecified: Secondary | ICD-10-CM

## 2015-04-16 DIAGNOSIS — E785 Hyperlipidemia, unspecified: Secondary | ICD-10-CM | POA: Diagnosis not present

## 2015-04-16 DIAGNOSIS — I6523 Occlusion and stenosis of bilateral carotid arteries: Secondary | ICD-10-CM

## 2015-04-16 DIAGNOSIS — Z01818 Encounter for other preprocedural examination: Secondary | ICD-10-CM | POA: Diagnosis not present

## 2015-04-16 DIAGNOSIS — I10 Essential (primary) hypertension: Secondary | ICD-10-CM | POA: Diagnosis not present

## 2015-04-16 DIAGNOSIS — I480 Paroxysmal atrial fibrillation: Secondary | ICD-10-CM

## 2015-04-16 DIAGNOSIS — I252 Old myocardial infarction: Secondary | ICD-10-CM

## 2015-04-16 DIAGNOSIS — I5032 Chronic diastolic (congestive) heart failure: Secondary | ICD-10-CM

## 2015-04-16 NOTE — Patient Instructions (Signed)
Medication Instructions:  No changes  Labwork: None today  Testing/Procedures: Schedule a Lexiscan Myoview (stress test). Your physician has requested that you have a lexiscan myoview. For further information please visit [email or web address]. Please follow instruction sheet, as given.   Follow-Up: [name] 3 mos.  Any Other Special Instructions Will Be Listed Below (If Applicable).  You will need to stop taking your Pradaxa 2 days before your surgery.  Dr. Scot Dock will tell you when it is safe to resume your Pradaxa.  Do not stop Pradaxa until 2 days before your surgery.

## 2015-04-21 ENCOUNTER — Telehealth (HOSPITAL_COMMUNITY): Payer: Self-pay

## 2015-04-21 NOTE — Telephone Encounter (Signed)
Patient gave me permission to give instructions to his sister.Patient and his sister [name]) given detailed instructions per Myocardial Perfusion Study Information Sheet for test on 04-23-2015 at 0930. Patient and his sister notified to arrive 15 minutes early and that it is imperative to arrive on time for appointment to keep from having the test rescheduled.  If you need to cancel or reschedule your appointment, please call the office within 24 hours of your appointment. Failure to do so may result in a cancellation of your appointment, and a $50 no show fee. Patient verbalized understanding. Oletta Lamas, Deng Kemler A

## 2015-04-23 ENCOUNTER — Ambulatory Visit (HOSPITAL_COMMUNITY): Payer: Medicare (Managed Care) | Attending: Cardiology

## 2015-04-23 DIAGNOSIS — Z01818 Encounter for other preprocedural examination: Secondary | ICD-10-CM | POA: Diagnosis not present

## 2015-04-23 DIAGNOSIS — R079 Chest pain, unspecified: Secondary | ICD-10-CM | POA: Insufficient documentation

## 2015-04-23 DIAGNOSIS — R9439 Abnormal result of other cardiovascular function study: Secondary | ICD-10-CM | POA: Diagnosis not present

## 2015-04-23 LAB — MYOCARDIAL PERFUSION IMAGING
CHL CUP NUCLEAR SDS: 10
CHL CUP NUCLEAR SSS: 22
LHR: 0.31
Peak HR: 148 {beats}/min
Rest HR: 136 {beats}/min
SRS: 12
TID: 1.07

## 2015-04-23 MED ORDER — AMINOPHYLLINE 25 MG/ML IV SOLN
75.0000 mg | Freq: Once | INTRAVENOUS | Status: AC
  Administered 2015-04-23: 75 mg via INTRAVENOUS

## 2015-04-23 MED ORDER — TECHNETIUM TC 99M SESTAMIBI GENERIC - CARDIOLITE
10.8000 | Freq: Once | INTRAVENOUS | Status: AC | PRN
  Administered 2015-04-23: 11 via INTRAVENOUS

## 2015-04-23 MED ORDER — REGADENOSON 0.4 MG/5ML IV SOLN
0.4000 mg | Freq: Once | INTRAVENOUS | Status: AC
  Administered 2015-04-23: 0.4 mg via INTRAVENOUS

## 2015-04-23 MED ORDER — TECHNETIUM TC 99M SESTAMIBI GENERIC - CARDIOLITE
32.7000 | Freq: Once | INTRAVENOUS | Status: AC | PRN
  Administered 2015-04-23: 32.7 via INTRAVENOUS

## 2015-04-24 ENCOUNTER — Telehealth: Payer: Self-pay | Admitting: *Deleted

## 2015-04-24 ENCOUNTER — Encounter: Payer: Self-pay | Admitting: Physician Assistant

## 2015-04-24 NOTE — Telephone Encounter (Signed)
I lmptcb on pt's phone. I did call DPR sister [name] and went over results for pt. I explained to [place] about results and advise if pt more sob over the wknd then go to ED. [name] states she will cb Monday to schedule appt, she is at work now and could not.

## 2015-04-28 ENCOUNTER — Encounter: Payer: Self-pay | Admitting: Physician Assistant

## 2015-04-28 ENCOUNTER — Ambulatory Visit (INDEPENDENT_AMBULATORY_CARE_PROVIDER_SITE_OTHER): Payer: Medicare (Managed Care) | Admitting: Physician Assistant

## 2015-04-28 ENCOUNTER — Inpatient Hospital Stay (HOSPITAL_COMMUNITY)
Admission: RE | Admit: 2015-04-28 | Discharge: 2015-04-28 | Disposition: A | Discharge status: Home / Self Care | Payer: Medicare (Managed Care) | Source: Ambulatory Visit

## 2015-04-28 ENCOUNTER — Encounter: Payer: Self-pay | Admitting: *Deleted

## 2015-04-28 VITALS — BP 110/60 | HR 84 | Ht 60.0 in | Wt 208.0 lb

## 2015-04-28 DIAGNOSIS — I5032 Chronic diastolic (congestive) heart failure: Secondary | ICD-10-CM | POA: Diagnosis not present

## 2015-04-28 DIAGNOSIS — R9439 Abnormal result of other cardiovascular function study: Secondary | ICD-10-CM | POA: Diagnosis not present

## 2015-04-28 DIAGNOSIS — I10 Essential (primary) hypertension: Secondary | ICD-10-CM

## 2015-04-28 DIAGNOSIS — I480 Paroxysmal atrial fibrillation: Secondary | ICD-10-CM

## 2015-04-28 DIAGNOSIS — I252 Old myocardial infarction: Secondary | ICD-10-CM | POA: Diagnosis not present

## 2015-04-28 DIAGNOSIS — E785 Hyperlipidemia, unspecified: Secondary | ICD-10-CM

## 2015-04-28 DIAGNOSIS — R0789 Other chest pain: Secondary | ICD-10-CM

## 2015-04-28 LAB — CBC WITH DIFFERENTIAL/PLATELET
BASOS PCT: 0.5 % (ref 0.0–3.0)
Basophils Absolute: 0.1 10*3/uL (ref 0.0–0.1)
EOS ABS: 0.5 10*3/uL (ref 0.0–0.7)
Eosinophils Relative: 4.6 % (ref 0.0–5.0)
HEMATOCRIT: 37 % — AB (ref 39.0–52.0)
Hemoglobin: 12.1 g/dL — ABNORMAL LOW (ref 13.0–17.0)
LYMPHS PCT: 20.9 % (ref 12.0–46.0)
Lymphs Abs: 2.3 10*3/uL (ref 0.7–4.0)
MCHC: 32.8 g/dL (ref 30.0–36.0)
MCV: 82 fl (ref 78.0–100.0)
Monocytes Absolute: 0.7 10*3/uL (ref 0.1–1.0)
Monocytes Relative: 6.6 % (ref 3.0–12.0)
NEUTROS ABS: 7.3 10*3/uL (ref 1.4–7.7)
Neutrophils Relative %: 67.4 % (ref 43.0–77.0)
PLATELETS: 337 10*3/uL (ref 150.0–400.0)
RBC: 4.51 Mil/uL (ref 4.22–5.81)
RDW: 15.3 % (ref 11.5–15.5)
WBC: 10.9 10*3/uL — ABNORMAL HIGH (ref 4.0–10.5)

## 2015-04-28 LAB — BASIC METABOLIC PANEL
BUN: 38 mg/dL — ABNORMAL HIGH (ref 6–23)
CHLORIDE: 92 meq/L — AB (ref 96–112)
CO2: 30 meq/L (ref 19–32)
CREATININE: 1.47 mg/dL (ref 0.40–1.50)
Calcium: 9 mg/dL (ref 8.4–10.5)
GFR: 51.81 mL/min — ABNORMAL LOW (ref >60.00)
Glucose, Bld: 216 mg/dL — ABNORMAL HIGH (ref 70–99)
Potassium: 3.1 mEq/L — ABNORMAL LOW (ref 3.5–5.1)
Sodium: 137 mEq/L (ref 135–145)

## 2015-04-28 LAB — PROTIME-INR
INR: 1.2 ratio — ABNORMAL HIGH (ref 0.8–1.0)
Prothrombin Time: 13.4 s — ABNORMAL HIGH (ref 9.6–13.1)

## 2015-04-28 NOTE — Patient Instructions (Addendum)
Medication Instructions:  Your physician recommends that you continue on your current medications as directed. Please refer to the Current Medication list given to you today.  Labwork: TODAY BMET, CBC W/DIFF, PT/INR FOR CATH  Testing/Procedures: Your physician has requested that you have a cardiac catheterization [place] 04/30/15 @ 11 AM. Cardiac catheterization is used to diagnose and/or treat various heart conditions. Doctors may recommend this procedure for a number of different reasons. The most common reason is to evaluate chest pain. Chest pain can be a symptom of coronary artery disease (CAD), and cardiac catheterization can show whether plaque is narrowing or blocking your heart's arteries. This procedure is also used to evaluate the valves, as well as measure the blood flow and oxygen levels in different parts of your heart. For further information please visit [email or web address]. Please follow instruction sheet, as given.   Follow-Up: 05/14/2015 @ 3:20 SCOTT WEAVER, PAC  Any Other Special Instructions Will Be Listed Below (If Applicable).

## 2015-04-28 NOTE — Progress Notes (Signed)
Cardiology Office Note   Date:  04/28/2015   ID:  HARBOR VANOVER, DOB 1955/11/28, MRN [id number]  PCP:  [name], MD  Cardiologist:  [name]   Vascular Surgeon:  Dr. Scot Dock   Chief Complaint  Patient presents with  . Follow-up    abnormal myoview  . Congestive Heart Failure  . Atrial Fibrillation     History of Present Illness: [name] is a 59 y.o. male with a hx of PAF, HTN, diabetes, HL, prior stroke, carotid stenosis, COPD, mental retardation. He was evaluated by Dr. Acie Fredrickson in 08/2012 for chest pain. Myoview was low risk. Echocardiogram in 3/14 demonstrated normal LV function with mild diastolic dysfunction. He has a history of diabetic ulcer and underwent right midfoot amputation in 04/2013.  Admitted 10/2014 with acute hypoxic respiratory failure secondary to a/c diastolic CHF, AECOPD and AF with RVR in the setting of sepsis syndrome related to left lower extremity cellulitis from diabetic foot ulcer.  He had elevated troponins possibly related to demand ischemia.  He converted to sinus rhythm with rate control therapy.   Given his multiple comorbid conditions, it was decided to treat him conservatively for his non-STEMI.  In follow-up, continued conservative management was recommended for his recent non-STEMI. However, he was seen Dr. Scot Dock with vascular surgery and noted to have progressive carotid artery stenosis on the left.  He needs carotid endarterectomy.  He was seen for surgical clearance and he did complain of occasional atypical sounding chest pain.  Myoview was arranged for risk stratification.  This demonstrated inferior, apical, inferolateral ischemia. Study was felt to be high risk. It was not gated as he was back in atrial fibrillation during the scan. Therefore, the patient is brought back today to arrange cardiac catheterization to further evaluate.  He returns by himself today. He relies upon [employer] for transportation. He  continues to have chest pain that seems to be related to positional changes. He denies significant change in his dyspnea. He denies orthopnea, PND or edema. He denies syncope.  Studies/Reports Reviewed Today:  Myoview 04/23/15  No T wave inversion was noted during stress.  There was no ST segment deviation noted during stress.  Defect 1: There is a large defect of severe severity.  Findings consistent with ischemia.  This is a high risk study. Large, mostly reversible inferior, apical and inferolateral reversible perfusion defect suggestive of ischemia (SDS 10, Extent 37%). This study was not-gated due to atrial fibrillation. This is a high-risk study based on the size and severity of the perfusion defect.  Carotid US 8/16 R 60-79% L 80-99%  Echo 12/18/14 Mild LVH, EF 55%, wall motion abnormalities could not be excluded, MAC, trivial MR, mild LAE  Echo 10/16/12 Mild LVH, vigorous LVF, EF 65-70%, normal wall motion, grade 1 diastolic dysfunction  Nuclear study 09/06/12 Myoview scan with small apical defect Seen in horizontal images only. Cannot exclude tiny region of apical ischemia vs shift soft tissue. Otherwise normal perfusion. Overall low risk scan.  LV Ejection Fraction: 69%. LV Wall Motion: NL LV Function; NL Wall Motion    Past Medical History  Diagnosis Date  . Hypertension   . Diabetes mellitus   . Mental retardation     Sister helps to take care of him and takes him to appts  . Tobacco user     Smokes 1ppd for multiple years.  Quit after hosp 09/2010.  Marland Kitchen Anemia     History of Iron Def  Anemia  . Anxiety   . Hyperlipidemia   . PAF (paroxysmal atrial fibrillation) 06/2009    CHADS score 2 (HTN, DM), was not on coumadin, but now on Pradaxa for Afib  . Lung nodule   . Chronic low back pain   . CVA (cerebral infarction) 09/2010    Bilateral with Left > Right  . Abdominal hernia     Chronic, not a good surgical candidate  . Carotid artery occlusion   . Obesity    . COPD (chronic obstructive pulmonary disease)   . Abscess, abdomen 12/31/2010    Referred to Wound Care in 01/2011 because of multiple abd abscess with VERY large ventral hernia (please look at image of CT abd/pelvis 09/2010).  Because of hernia I was hesitant to I&D.      Marland Kitchen GERD (gastroesophageal reflux disease)   . Pneumonia     hx of  . Chronic diastolic CHF (congestive heart failure)     takes Lasix  . Itching     all over body; pt scratches and has sores on bilateral arms and abdomen  . Hx of echocardiogram     Echo 5/16:  Mild LVH, EF 55%, indeterm. diast function, WMA could not be ruled out, MAC, trivial MR, mild LAE, normal RVF  . Myocardial infarction 2016 ?    Heart attack  (  Per  pt. )  . History of nuclear stress test     Myoview 9/16:  Inferior, apical and inf-lateral ischemia; not gated; High Risk    Past Surgical History  Procedure Laterality Date  . Carotid arteriogram  10/2010    30% right ICA stenosis, 40% left ICA stenosis   . Transesophageal echocardiogram  09/2010    No ASD or PFO. EF 60-65%.  Normal systolic function. No evidence of thrombus.   . Transthoracic echocardiogram  09/2010     The cavity size was normal. Systolic function was vigorous.  EF 65-70%.  Normal wall funciton.   . Debridment of decubitus ulcer Right 02/13/2013  . I&d extremity Right 02/13/2013    Procedure: IRRIGATION AND DEBRIDEMENT FOOT ULCER;  Surgeon: Johnny Bridge, MD;  Location: [facility];  Service: Orthopedics;  Laterality: Right;  PULSE LAVAGE  . Cataract extraction, bilateral    . Arm surgery Left     as a child  . Amputation Right 03/29/2013    Procedure: AMPUTATION RAY;  Surgeon: Newt Minion, MD;  Location: [facility];  Service: Orthopedics;  Laterality: Right;  Right Foot 3rd and Possible 4th Ray Amputation  . Multiple tooth extractions    . Amputation Right 04/23/2013    Procedure: AMPUTATION RIGHT MID-FOOT;  Surgeon: Newt Minion, MD;  Location: [facility];  Service: Orthopedics;   Laterality: Right;  . Esophagogastroduodenoscopy (egd) with propofol N/A 06/10/2014    Procedure: ESOPHAGOGASTRODUODENOSCOPY (EGD) WITH PROPOFOL;  Surgeon: Jerene Bears, MD;  Location: [facility];  Service: Gastroenterology;  Laterality: N/A;  . Colonoscopy with propofol N/A 06/10/2014    Procedure: COLONOSCOPY WITH PROPOFOL;  Surgeon: Jerene Bears, MD;  Location: [facility];  Service: Gastroenterology;  Laterality: N/A;  . Givens capsule study N/A 07/09/2014    Procedure: GIVENS CAPSULE STUDY;  Surgeon: Jerene Bears, MD;  Location: [facility];  Service: Gastroenterology;  Laterality: N/A;     Current Outpatient Prescriptions  Medication Sig Dispense Refill  . alum & mag hydroxide-simeth (MAALOX/MYLANTA) 200-200-20 MG/5ML suspension Take 30 mLs by mouth every 6 (six) hours as needed for indigestion  or heartburn. 355 mL 0  . aspirin EC 81 MG EC tablet Take 1 tablet (81 mg total) by mouth daily. 90 tablet 3  . atorvastatin (LIPITOR) 80 MG tablet Take 1 tablet (80 mg total) by mouth daily at 6 PM. 45 tablet 0  . Cholecalciferol (VITAMIN D3) 1000 UNITS CAPS Take 1,000 Units by mouth daily.    . clonazePAM (KLONOPIN) 0.5 MG tablet Take 1 tablet (0.5 mg total) by mouth at bedtime. 30 tablet 0  . dabigatran (PRADAXA) 150 MG CAPS Take 150 mg by mouth every 12 (twelve) hours.    Marland Kitchen diltiazem (CARDIZEM CD) 180 MG 24 hr capsule Take 1 capsule (180 mg total) by mouth daily. 30 capsule 0  . doxycycline (VIBRA-TABS) 100 MG tablet Take 1 tablet (100 mg total) by mouth every 12 (twelve) hours. 4 tablet 0  . ferrous gluconate (FERGON) 324 MG tablet Take 1 tablet (324 mg total) by mouth 2 (two) times daily with a meal. 60 tablet 0  . furosemide (LASIX) 80 MG tablet Take 1 tablet (80 mg total) by mouth 2 (two) times daily. 60 tablet 0  . gabapentin (NEURONTIN) 300 MG capsule Take 1 capsule (300 mg total) by mouth 2 (two) times daily. 60 capsule 0  . HYDROcodone-acetaminophen (NORCO/VICODIN) 5-325 MG per tablet  1 by mouth once daily as needed for pain control 30 tablet 0  . hydrocortisone 2.5 % ointment Apply topically 3 (three) times daily. To leg around wound. Do not apply to broken skin. 30 g 0  . insulin glargine (LANTUS) 100 UNIT/ML injection Inject 40 Units into the skin at bedtime.     . isosorbide mononitrate (IMDUR) 30 MG 24 hr tablet Take 30 mg by mouth every morning.    . magnesium oxide (MAG-OX) 400 MG tablet Take 400 mg by mouth daily.    . metFORMIN (GLUCOPHAGE) 1000 MG tablet Take 1,000 mg by mouth 2 (two) times daily with a meal.    . metoprolol succinate (TOPROL-XL) 50 MG 24 hr tablet Take 1 tablet (50 mg total) by mouth as directed. Take 1/2 tab daily per PACE 12/22/14; Take with or immediately following a meal.    . nitroGLYCERIN (NITROSTAT) 0.4 MG SL tablet Place 0.4 mg under the tongue every 5 (five) minutes as needed for chest pain.     Marland Kitchen omeprazole (PRILOSEC) 20 MG capsule Take 20 mg by mouth daily.    Marland Kitchen PARoxetine (PAXIL) 40 MG tablet Take 40 mg by mouth every morning.    . saccharomyces boulardii (FLORASTOR) 250 MG capsule Take 1 capsule (250 mg total) by mouth 2 (two) times daily. 60 capsule 2   No current facility-administered medications for this visit.    Allergies:   Penicillins    Social History:  The patient  reports that he quit smoking about 4 years ago. His smoking use included Cigarettes. He has a 42 pack-year smoking history. He quit smokeless tobacco use about 4 years ago. He reports that he drinks alcohol. He reports that he does not use illicit drugs.   Family History:  The patient's family history includes Breast cancer in his maternal grandmother; Heart attack in his maternal grandmother; Heart disease in his brother, father, maternal grandmother, mother, and sister; Hypertension in his sister; Peripheral vascular disease in his father; Stomach cancer in his maternal uncle. There is no history of Colon cancer or Stroke.    ROS:   Please see the history of  present illness.   Review of Systems  Cardiovascular: Positive for chest pain.  Gastrointestinal: Positive for hematochezia.       Hemorrhoidal bleeding  All other systems reviewed and are negative.    PHYSICAL EXAM: VS:  BP 110/60 mmHg  Pulse 84  Ht 5' (1.524 m)  Wt 208 lb (94.348 kg)  BMI 40.62 kg/m2  SpO2 97%    Wt Readings from Last 3 Encounters:  04/28/15 208 lb (94.348 kg)  04/23/15 209 lb (94.802 kg)  04/16/15 209 lb 6.4 oz (94.983 kg)     GEN: WN/WD male, in no acute distress HEENT: normal Neck: I cannot assess JVD, no masses Cardiac:  Normal S1/S2, RRR; no murmur ,  no rubs or gallops, trace bilateral LE edema Respiratory:  Decreased breath sounds bilaterally, no wheezing. GI: Large ventral hernia noted, soft, + BS MS: no deformity or atrophy Skin: warm and dry  Neuro:  CNs II-XII intact, Strength and sensation are intact Psych: Normal affect   EKG:  EKG is ordered today.  It demonstrates:   NSR, HR 84, RBBB,nonspecific ST-T wave changes, no change from prior tracing.    Recent Labs: 10/26/2014: B Natriuretic Peptide 345.2* 11/01/2014: Magnesium 2.5 11/03/2014: Hemoglobin 10.3*; Platelets 329 12/15/2014: ALT 14; BUN 43*; Creatinine, Ser 1.29; Potassium 3.9; Pro B Natriuretic peptide (BNP) 290.0*; Sodium 130*    Lipid Panel    Component Value Date/Time   CHOL 124 11/10/2012 0255   TRIG 112 11/10/2012 0255   HDL 24* 11/10/2012 0255   CHOLHDL 5.2 11/10/2012 0255   VLDL 22 11/10/2012 0255   LDLCALC 78 11/10/2012 0255   LDLDIRECT 54 08/23/2012 1447    Recent Labs  10/26/14 0329 10/26/14 1610 10/27/14 0357 10/27/14 1445 10/27/14 1957 10/27/14 2350 10/28/14 0228  TROPONINI 0.12* 5.92* 3.44* 3.41* 5.19* 4.90* 6.47*       ASSESSMENT AND PLAN:  Hx of NSTEMI:    The patient had a fairly substantial rise in his troponin level in 10/2014 while hospitalized for acute, multiple medical illnesses. At that time, it was decided to treat him medically. He did  not have many symptoms to suggest angina. A follow-up echocardiogram demonstrated normal LV function. Now he must undergo vascular surgery. His history is somewhat difficult as he does suffer from some developmental delays. He has had some chest pain.  He does have a high risk Myoview with a large area of inferior ischemia. This study was also reviewed by Dr. Acie Fredrickson. The recommendation is to proceed with cardiac catheterization. Risks and benefits of cardiac catheterization have been discussed with the patient.  These include bleeding, infection, kidney damage, stroke, heart attack, death.  The patient understands these risks and is willing to proceed. Of note, he has a lot of transportation issues.  He will likely need to be kept overnight for observation after his LHC.   Chronic diastolic CHF (congestive heart failure):  Volume appears to be stable. Continue current Rx.     Paroxysmal atrial fibrillation:  Maintaining NSR. He remains on Pradaxa for anticoagulation. He has a hx of CVA.  His Creatinine Clearance is 76.  I reviewed his case with our PharmD.  Recommendation is to hold Pradaxa 1-2 days prior to his procedure and resume it within 24 hours post cath.  Given his hx of CVA and carotid stenosis, I have recommended he hold his Pradaxa for 1 day prior to cath.     Essential hypertension:  Controlled.  HYPERCHOLESTEROLEMIA:  Continue statin.    Carotid stenosis, bilateral:  L CEA pending.  Medication Adjustments: Current medicines are reviewed at length with the patient today.  Concerns regarding medicines are as outlined above.  The following changes have been made:   Discontinued Medications   No medications on file   Modified Medications   No medications on file   New Prescriptions   No medications on file   Labs/ tests ordered today include:  No orders of the defined types were placed in this encounter.    Disposition:    FU with me 2 weeks post cath     Signed, Versie Starks, MHS 04/28/2015 1:30 PM    [address], [place], [place]  [address] Phone: [phone]; Fax: [phone]

## 2015-04-29 ENCOUNTER — Telehealth: Payer: Self-pay | Admitting: *Deleted

## 2015-04-29 ENCOUNTER — Encounter: Payer: Self-pay | Admitting: *Deleted

## 2015-04-29 DIAGNOSIS — I10 Essential (primary) hypertension: Secondary | ICD-10-CM

## 2015-04-29 DIAGNOSIS — E7800 Pure hypercholesterolemia, unspecified: Secondary | ICD-10-CM

## 2015-04-29 DIAGNOSIS — I5031 Acute diastolic (congestive) heart failure: Secondary | ICD-10-CM

## 2015-04-29 DIAGNOSIS — I4891 Unspecified atrial fibrillation: Secondary | ICD-10-CM

## 2015-04-29 DIAGNOSIS — I6523 Occlusion and stenosis of bilateral carotid arteries: Secondary | ICD-10-CM

## 2015-04-29 DIAGNOSIS — I5032 Chronic diastolic (congestive) heart failure: Secondary | ICD-10-CM

## 2015-04-29 DIAGNOSIS — I248 Other forms of acute ischemic heart disease: Secondary | ICD-10-CM

## 2015-04-29 NOTE — Telephone Encounter (Signed)
   Hgb stable  Creatinine stable  Sugar high - FU with PCP for DM mgmt  K+ low >> take K+ 20 mEq x 3 for 1 day (total 60 mEq), then take 20 mEq bid  Repeat BMET in short stay AM of cath

## 2015-04-29 NOTE — Telephone Encounter (Signed)
LMTCB

## 2015-04-29 NOTE — Telephone Encounter (Signed)
Notified Corwin Levins, RN/Short Stay, that MD is requesting Stat BMET in the morning prior to the Cardiac Cath by Dr. Tamala Julian.  Attempted to call patient to provide him with lab results and the information below from [facility].  LMTCB    Hgb stable    Creatinine stable    Sugar high - FU with PCP for DM mgmt    K+ low >> take K+ 20 mEq x 3 for 1 day (total 60 mEq), then take 20 mEq bid    Repeat BMET in short stay AM of cath    Richardson Dopp, PA-C     04/28/2015 9:40 PM

## 2015-04-30 ENCOUNTER — Ambulatory Visit (HOSPITAL_COMMUNITY)
Admission: RE | Admit: 2015-04-30 | Discharge: 2015-04-30 | Disposition: A | Discharge status: Home / Self Care | Payer: Medicare (Managed Care) | Source: Ambulatory Visit | Attending: Interventional Cardiology | Admitting: Interventional Cardiology

## 2015-04-30 ENCOUNTER — Encounter (HOSPITAL_COMMUNITY)
Admission: RE | Disposition: A | Discharge status: Home / Self Care | Payer: Self-pay | Source: Ambulatory Visit | Attending: Interventional Cardiology

## 2015-04-30 DIAGNOSIS — E119 Type 2 diabetes mellitus without complications: Secondary | ICD-10-CM | POA: Insufficient documentation

## 2015-04-30 DIAGNOSIS — J449 Chronic obstructive pulmonary disease, unspecified: Secondary | ICD-10-CM | POA: Insufficient documentation

## 2015-04-30 DIAGNOSIS — I252 Old myocardial infarction: Secondary | ICD-10-CM | POA: Insufficient documentation

## 2015-04-30 DIAGNOSIS — F419 Anxiety disorder, unspecified: Secondary | ICD-10-CM | POA: Diagnosis not present

## 2015-04-30 DIAGNOSIS — I2582 Chronic total occlusion of coronary artery: Secondary | ICD-10-CM | POA: Diagnosis not present

## 2015-04-30 DIAGNOSIS — E780 Pure hypercholesterolemia: Secondary | ICD-10-CM | POA: Diagnosis not present

## 2015-04-30 DIAGNOSIS — F79 Unspecified intellectual disabilities: Secondary | ICD-10-CM | POA: Insufficient documentation

## 2015-04-30 DIAGNOSIS — Z89431 Acquired absence of right foot: Secondary | ICD-10-CM | POA: Insufficient documentation

## 2015-04-30 DIAGNOSIS — I5032 Chronic diastolic (congestive) heart failure: Secondary | ICD-10-CM | POA: Insufficient documentation

## 2015-04-30 DIAGNOSIS — L97519 Non-pressure chronic ulcer of other part of right foot with unspecified severity: Secondary | ICD-10-CM

## 2015-04-30 DIAGNOSIS — I4510 Unspecified right bundle-branch block: Secondary | ICD-10-CM | POA: Diagnosis not present

## 2015-04-30 DIAGNOSIS — I480 Paroxysmal atrial fibrillation: Secondary | ICD-10-CM | POA: Insufficient documentation

## 2015-04-30 DIAGNOSIS — Z794 Long term (current) use of insulin: Secondary | ICD-10-CM | POA: Insufficient documentation

## 2015-04-30 DIAGNOSIS — G8929 Other chronic pain: Secondary | ICD-10-CM | POA: Diagnosis not present

## 2015-04-30 DIAGNOSIS — Z87891 Personal history of nicotine dependence: Secondary | ICD-10-CM | POA: Insufficient documentation

## 2015-04-30 DIAGNOSIS — I10 Essential (primary) hypertension: Secondary | ICD-10-CM | POA: Insufficient documentation

## 2015-04-30 DIAGNOSIS — Z6841 Body Mass Index (BMI) 40.0 and over, adult: Secondary | ICD-10-CM | POA: Insufficient documentation

## 2015-04-30 DIAGNOSIS — Z7982 Long term (current) use of aspirin: Secondary | ICD-10-CM | POA: Insufficient documentation

## 2015-04-30 DIAGNOSIS — M545 Low back pain: Secondary | ICD-10-CM | POA: Insufficient documentation

## 2015-04-30 DIAGNOSIS — K439 Ventral hernia without obstruction or gangrene: Secondary | ICD-10-CM | POA: Diagnosis present

## 2015-04-30 DIAGNOSIS — E669 Obesity, unspecified: Secondary | ICD-10-CM | POA: Insufficient documentation

## 2015-04-30 DIAGNOSIS — I2510 Atherosclerotic heart disease of native coronary artery without angina pectoris: Secondary | ICD-10-CM | POA: Diagnosis not present

## 2015-04-30 DIAGNOSIS — R9439 Abnormal result of other cardiovascular function study: Secondary | ICD-10-CM

## 2015-04-30 DIAGNOSIS — E11621 Type 2 diabetes mellitus with foot ulcer: Secondary | ICD-10-CM | POA: Diagnosis present

## 2015-04-30 DIAGNOSIS — I779 Disorder of arteries and arterioles, unspecified: Secondary | ICD-10-CM | POA: Diagnosis present

## 2015-04-30 DIAGNOSIS — R0789 Other chest pain: Secondary | ICD-10-CM | POA: Diagnosis present

## 2015-04-30 DIAGNOSIS — I25811 Atherosclerosis of native coronary artery of transplanted heart without angina pectoris: Secondary | ICD-10-CM | POA: Insufficient documentation

## 2015-04-30 DIAGNOSIS — K219 Gastro-esophageal reflux disease without esophagitis: Secondary | ICD-10-CM | POA: Diagnosis not present

## 2015-04-30 DIAGNOSIS — I6523 Occlusion and stenosis of bilateral carotid arteries: Secondary | ICD-10-CM | POA: Diagnosis not present

## 2015-04-30 DIAGNOSIS — Z8673 Personal history of transient ischemic attack (TIA), and cerebral infarction without residual deficits: Secondary | ICD-10-CM | POA: Diagnosis not present

## 2015-04-30 DIAGNOSIS — I739 Peripheral vascular disease, unspecified: Secondary | ICD-10-CM

## 2015-04-30 DIAGNOSIS — J441 Chronic obstructive pulmonary disease with (acute) exacerbation: Secondary | ICD-10-CM | POA: Diagnosis present

## 2015-04-30 HISTORY — PX: CARDIAC CATHETERIZATION: SHX172

## 2015-04-30 LAB — BASIC METABOLIC PANEL
Anion gap: 11 (ref 5–15)
BUN: 26 mg/dL — AB (ref 6–20)
CO2: 35 mmol/L — AB (ref 22–32)
Calcium: 9.4 mg/dL (ref 8.9–10.3)
Chloride: 89 mmol/L — ABNORMAL LOW (ref 101–111)
Creatinine, Ser: 1.29 mg/dL — ABNORMAL HIGH (ref 0.61–1.24)
GFR calc Af Amer: >60 mL/min (ref >60)
GFR, EST NON AFRICAN AMERICAN: 59 mL/min — AB (ref >60)
GLUCOSE: 340 mg/dL — AB (ref 65–99)
POTASSIUM: 3 mmol/L — AB (ref 3.5–5.1)
Sodium: 135 mmol/L (ref 135–145)

## 2015-04-30 LAB — GLUCOSE, CAPILLARY: Glucose-Capillary: 317 mg/dL — ABNORMAL HIGH (ref 65–99)

## 2015-04-30 SURGERY — LEFT HEART CATH AND CORONARY ANGIOGRAPHY
Anesthesia: LOCAL

## 2015-04-30 MED ORDER — VERAPAMIL HCL 2.5 MG/ML IV SOLN
INTRAVENOUS | Status: DC | PRN
  Administered 2015-04-30: 14:00:00 via INTRA_ARTERIAL

## 2015-04-30 MED ORDER — ACETAMINOPHEN 325 MG PO TABS: 650.0000 mg | ORAL_TABLET | ORAL | Status: DC | PRN

## 2015-04-30 MED ORDER — OXYCODONE-ACETAMINOPHEN 5-325 MG PO TABS: 1.0000 | ORAL_TABLET | ORAL | Status: DC | PRN

## 2015-04-30 MED ORDER — SODIUM CHLORIDE 0.9 % IV SOLN: 250.0000 mL | INTRAVENOUS | Status: DC | PRN

## 2015-04-30 MED ORDER — ASPIRIN 81 MG PO CHEW: 81.0000 mg | CHEWABLE_TABLET | ORAL | Status: DC

## 2015-04-30 MED ORDER — MIDAZOLAM HCL 2 MG/2ML IJ SOLN
INTRAMUSCULAR | Status: DC | PRN
  Administered 2015-04-30: 1 mg via INTRAVENOUS

## 2015-04-30 MED ORDER — ASPIRIN EC 325 MG PO TBEC: 325.0000 mg | DELAYED_RELEASE_TABLET | Freq: Every day | ORAL | Status: DC

## 2015-04-30 MED ORDER — NYSTATIN 100000 UNIT/GM EX CREA: TOPICAL_CREAM | Freq: Two times a day (BID) | CUTANEOUS | Status: DC

## 2015-04-30 MED ORDER — SODIUM CHLORIDE 0.9 % IV SOLN
INTRAVENOUS | Status: DC
  Administered 2015-04-30: 10:00:00 via INTRAVENOUS

## 2015-04-30 MED ORDER — HEPARIN SODIUM (PORCINE) 1000 UNIT/ML IJ SOLN
INTRAMUSCULAR | Status: DC | PRN
  Administered 2015-04-30: 4500 [IU] via INTRAVENOUS

## 2015-04-30 MED ORDER — POTASSIUM CHLORIDE CRYS ER 20 MEQ PO TBCR
EXTENDED_RELEASE_TABLET | ORAL | Status: AC
  Filled 2015-04-30: qty 1

## 2015-04-30 MED ORDER — SODIUM CHLORIDE 0.9 % IJ SOLN: 3.0000 mL | Freq: Two times a day (BID) | INTRAMUSCULAR | Status: DC

## 2015-04-30 MED ORDER — POTASSIUM CHLORIDE CRYS ER 20 MEQ PO TBCR
40.0000 meq | EXTENDED_RELEASE_TABLET | ORAL | Status: AC
  Administered 2015-04-30 (×3): 40 meq via ORAL
  Filled 2015-04-30 (×3): qty 2

## 2015-04-30 MED ORDER — POTASSIUM CHLORIDE CRYS ER 20 MEQ PO TBCR
EXTENDED_RELEASE_TABLET | ORAL | Status: AC
  Filled 2015-04-30: qty 2

## 2015-04-30 MED ORDER — HEPARIN (PORCINE) IN NACL 2-0.9 UNIT/ML-% IJ SOLN
INTRAMUSCULAR | Status: AC
  Filled 2015-04-30: qty 1000

## 2015-04-30 MED ORDER — SODIUM CHLORIDE 0.9 % IJ SOLN: 3.0000 mL | INTRAMUSCULAR | Status: DC | PRN

## 2015-04-30 MED ORDER — VERAPAMIL HCL 2.5 MG/ML IV SOLN
INTRAVENOUS | Status: AC
Start: 2015-04-30 — End: 2015-04-30
  Filled 2015-04-30: qty 2

## 2015-04-30 MED ORDER — MIDAZOLAM HCL 2 MG/2ML IJ SOLN
INTRAMUSCULAR | Status: AC
  Filled 2015-04-30: qty 4

## 2015-04-30 MED ORDER — SODIUM CHLORIDE 0.9 % WEIGHT BASED INFUSION: 3.0000 mL/kg/h | INTRAVENOUS | Status: DC

## 2015-04-30 MED ORDER — LIDOCAINE HCL (PF) 1 % IJ SOLN
INTRAMUSCULAR | Status: DC | PRN
  Administered 2015-04-30: 14:00:00

## 2015-04-30 MED ORDER — IOHEXOL 350 MG/ML SOLN
INTRAVENOUS | Status: DC | PRN
  Administered 2015-04-30: 90 mL via INTRACARDIAC

## 2015-04-30 MED ORDER — LIDOCAINE HCL (PF) 1 % IJ SOLN
INTRAMUSCULAR | Status: DC | PRN
  Administered 2015-04-30: 5 mL via SUBCUTANEOUS

## 2015-04-30 MED ORDER — FENTANYL CITRATE (PF) 100 MCG/2ML IJ SOLN
INTRAMUSCULAR | Status: DC | PRN
  Administered 2015-04-30: 50 ug via INTRAVENOUS

## 2015-04-30 MED ORDER — LIDOCAINE HCL (PF) 1 % IJ SOLN
INTRAMUSCULAR | Status: AC
  Filled 2015-04-30: qty 30

## 2015-04-30 MED ORDER — ONDANSETRON HCL 4 MG/2ML IJ SOLN: 4.0000 mg | Freq: Four times a day (QID) | INTRAMUSCULAR | Status: DC | PRN

## 2015-04-30 MED ORDER — POTASSIUM CHLORIDE CRYS ER 20 MEQ PO TBCR
EXTENDED_RELEASE_TABLET | ORAL | Status: DC
Start: 2015-04-30 — End: 2015-04-30
  Filled 2015-04-30: qty 1

## 2015-04-30 MED ORDER — FENTANYL CITRATE (PF) 100 MCG/2ML IJ SOLN
INTRAMUSCULAR | Status: AC
  Filled 2015-04-30: qty 4

## 2015-04-30 MED ORDER — HEPARIN SODIUM (PORCINE) 1000 UNIT/ML IJ SOLN
INTRAMUSCULAR | Status: AC
  Filled 2015-04-30: qty 1

## 2015-04-30 SURGICAL SUPPLY — 10 items

## 2015-04-30 NOTE — Progress Notes (Signed)
Pt ambulated to bathroom without diff.  No complaints. Site to right wrist intact.

## 2015-04-30 NOTE — Telephone Encounter (Signed)
Pt currently in hospital having Cath. Will close this encounter but leave the Results encounter on triage desktop in case patient returns call.

## 2015-04-30 NOTE — Interval H&P Note (Signed)
Cath Lab Visit (complete for each Cath Lab visit)  Clinical Evaluation Leading to the Procedure:   ACS: Yes.    Non-ACS:    Anginal Classification: CCS II  Anti-ischemic medical therapy: Maximal Therapy (2 or more classes of medications)  Non-Invasive Test Results: High-risk stress test findings: cardiac mortality >3%/year  Prior CABG: No previous CABG      History and Physical Interval Note:  04/30/2015 12:46 PM  [name]  has presented today for surgery, with the diagnosis of c/p  The various methods of treatment have been discussed with the patient and family. After consideration of risks, benefits and other options for treatment, the patient has consented to  Procedure(s): Left Heart Cath and Coronary Angiography (N/A) as a surgical intervention .  The patient's history has been reviewed, patient examined, no change in status, stable for surgery.  I have reviewed the patient's chart and labs.  Questions were answered to the patient's satisfaction.     Sinclair Grooms

## 2015-04-30 NOTE — Telephone Encounter (Signed)
Patient has not returned call as of yet. I have called Short Stay/Procedural and spoke to [place] to inform her that patient did not call back to receive instruction on K+ supplementation and recommendation to follow up with his PCP regarding elevated glucose/DM mgmt. Izora Gala is aware to draw BMET prior to cath today. Routed to Dr. Tamala Julian, Richardson Dopp, PA, and Julaine Hua, [place] - as Juluis Rainier.

## 2015-04-30 NOTE — H&P (View-Only) (Signed)
Cardiology Office Note   Date:  04/28/2015   ID:  [name], DOB Jun 11, 1956, MRN [id number]  PCP:  [name], MD  Cardiologist:  [name]   Vascular Surgeon:  Dr. Scot Dock   Chief Complaint  Patient presents with  . Follow-up    abnormal myoview  . Congestive Heart Failure  . Atrial Fibrillation     History of Present Illness: [name] is a 59 y.o. male with a hx of PAF, HTN, diabetes, HL, prior stroke, carotid stenosis, COPD, mental retardation. He was evaluated by Dr. Acie Fredrickson in 08/2012 for chest pain. Myoview was low risk. Echocardiogram in 3/14 demonstrated normal LV function with mild diastolic dysfunction. He has a history of diabetic ulcer and underwent right midfoot amputation in 04/2013.  Admitted 10/2014 with acute hypoxic respiratory failure secondary to a/c diastolic CHF, AECOPD and AF with RVR in the setting of sepsis syndrome related to left lower extremity cellulitis from diabetic foot ulcer.  He had elevated troponins possibly related to demand ischemia.  He converted to sinus rhythm with rate control therapy.   Given his multiple comorbid conditions, it was decided to treat him conservatively for his non-STEMI.  In follow-up, continued conservative management was recommended for his recent non-STEMI. However, he was seen Dr. Scot Dock with vascular surgery and noted to have progressive carotid artery stenosis on the left.  He needs carotid endarterectomy.  He was seen for surgical clearance and he did complain of occasional atypical sounding chest pain.  Myoview was arranged for risk stratification.  This demonstrated inferior, apical, inferolateral ischemia. Study was felt to be high risk. It was not gated as he was back in atrial fibrillation during the scan. Therefore, the patient is brought back today to arrange cardiac catheterization to further evaluate.  He returns by himself today. He relies upon [facility] for transportation. He  continues to have chest pain that seems to be related to positional changes. He denies significant change in his dyspnea. He denies orthopnea, PND or edema. He denies syncope.  Studies/Reports Reviewed Today:  Myoview 04/23/15  No T wave inversion was noted during stress.  There was no ST segment deviation noted during stress.  Defect 1: There is a large defect of severe severity.  Findings consistent with ischemia.  This is a high risk study. Large, mostly reversible inferior, apical and inferolateral reversible perfusion defect suggestive of ischemia (SDS 10, Extent 37%). This study was not-gated due to atrial fibrillation. This is a high-risk study based on the size and severity of the perfusion defect.  Carotid US 8/16 R 60-79% L 80-99%  Echo 12/18/14 Mild LVH, EF 55%, wall motion abnormalities could not be excluded, MAC, trivial MR, mild LAE  Echo 10/16/12 Mild LVH, vigorous LVF, EF 65-70%, normal wall motion, grade 1 diastolic dysfunction  Nuclear study 09/06/12 Myoview scan with small apical defect Seen in horizontal images only. Cannot exclude tiny region of apical ischemia vs shift soft tissue. Otherwise normal perfusion. Overall low risk scan.  LV Ejection Fraction: 69%. LV Wall Motion: NL LV Function; NL Wall Motion    Past Medical History  Diagnosis Date  . Hypertension   . Diabetes mellitus   . Mental retardation     Sister helps to take care of him and takes him to appts  . Tobacco user     Smokes 1ppd for multiple years.  Quit after hosp 09/2010.  Marland Kitchen Anemia     History of Iron Def  Anemia  . Anxiety   . Hyperlipidemia   . PAF (paroxysmal atrial fibrillation) 06/2009    CHADS score 2 (HTN, DM), was not on coumadin, but now on Pradaxa for Afib  . Lung nodule   . Chronic low back pain   . CVA (cerebral infarction) 09/2010    Bilateral with Left > Right  . Abdominal hernia     Chronic, not a good surgical candidate  . Carotid artery occlusion   . Obesity    . COPD (chronic obstructive pulmonary disease)   . Abscess, abdomen 12/31/2010    Referred to Wound Care in 01/2011 because of multiple abd abscess with VERY large ventral hernia (please look at image of CT abd/pelvis 09/2010).  Because of hernia I was hesitant to I&D.      Marland Kitchen GERD (gastroesophageal reflux disease)   . Pneumonia     hx of  . Chronic diastolic CHF (congestive heart failure)     takes Lasix  . Itching     all over body; pt scratches and has sores on bilateral arms and abdomen  . Hx of echocardiogram     Echo 5/16:  Mild LVH, EF 55%, indeterm. diast function, WMA could not be ruled out, MAC, trivial MR, mild LAE, normal RVF  . Myocardial infarction 2016 ?    Heart attack  (  Per  pt. )  . History of nuclear stress test     Myoview 9/16:  Inferior, apical and inf-lateral ischemia; not gated; High Risk    Past Surgical History  Procedure Laterality Date  . Carotid arteriogram  10/2010    30% right ICA stenosis, 40% left ICA stenosis   . Transesophageal echocardiogram  09/2010    No ASD or PFO. EF 60-65%.  Normal systolic function. No evidence of thrombus.   . Transthoracic echocardiogram  09/2010     The cavity size was normal. Systolic function was vigorous.  EF 65-70%.  Normal wall funciton.   . Debridment of decubitus ulcer Right 02/13/2013  . I&d extremity Right 02/13/2013    Procedure: IRRIGATION AND DEBRIDEMENT FOOT ULCER;  Surgeon: Johnny Bridge, MD;  Location: [facility];  Service: Orthopedics;  Laterality: Right;  PULSE LAVAGE  . Cataract extraction, bilateral    . Arm surgery Left     as a child  . Amputation Right 03/29/2013    Procedure: AMPUTATION RAY;  Surgeon: Newt Minion, MD;  Location: [facility];  Service: Orthopedics;  Laterality: Right;  Right Foot 3rd and Possible 4th Ray Amputation  . Multiple tooth extractions    . Amputation Right 04/23/2013    Procedure: AMPUTATION RIGHT MID-FOOT;  Surgeon: Newt Minion, MD;  Location: [facility];  Service: Orthopedics;   Laterality: Right;  . Esophagogastroduodenoscopy (egd) with propofol N/A 06/10/2014    Procedure: ESOPHAGOGASTRODUODENOSCOPY (EGD) WITH PROPOFOL;  Surgeon: Jerene Bears, MD;  Location: [facility];  Service: Gastroenterology;  Laterality: N/A;  . Colonoscopy with propofol N/A 06/10/2014    Procedure: COLONOSCOPY WITH PROPOFOL;  Surgeon: Jerene Bears, MD;  Location: [facility];  Service: Gastroenterology;  Laterality: N/A;  . Givens capsule study N/A 07/09/2014    Procedure: GIVENS CAPSULE STUDY;  Surgeon: Jerene Bears, MD;  Location: [facility];  Service: Gastroenterology;  Laterality: N/A;     Current Outpatient Prescriptions  Medication Sig Dispense Refill  . alum & mag hydroxide-simeth (MAALOX/MYLANTA) 200-200-20 MG/5ML suspension Take 30 mLs by mouth every 6 (six) hours as needed for indigestion  or heartburn. 355 mL 0  . aspirin EC 81 MG EC tablet Take 1 tablet (81 mg total) by mouth daily. 90 tablet 3  . atorvastatin (LIPITOR) 80 MG tablet Take 1 tablet (80 mg total) by mouth daily at 6 PM. 45 tablet 0  . Cholecalciferol (VITAMIN D3) 1000 UNITS CAPS Take 1,000 Units by mouth daily.    . clonazePAM (KLONOPIN) 0.5 MG tablet Take 1 tablet (0.5 mg total) by mouth at bedtime. 30 tablet 0  . dabigatran (PRADAXA) 150 MG CAPS Take 150 mg by mouth every 12 (twelve) hours.    Marland Kitchen diltiazem (CARDIZEM CD) 180 MG 24 hr capsule Take 1 capsule (180 mg total) by mouth daily. 30 capsule 0  . doxycycline (VIBRA-TABS) 100 MG tablet Take 1 tablet (100 mg total) by mouth every 12 (twelve) hours. 4 tablet 0  . ferrous gluconate (FERGON) 324 MG tablet Take 1 tablet (324 mg total) by mouth 2 (two) times daily with a meal. 60 tablet 0  . furosemide (LASIX) 80 MG tablet Take 1 tablet (80 mg total) by mouth 2 (two) times daily. 60 tablet 0  . gabapentin (NEURONTIN) 300 MG capsule Take 1 capsule (300 mg total) by mouth 2 (two) times daily. 60 capsule 0  . HYDROcodone-acetaminophen (NORCO/VICODIN) 5-325 MG per tablet  1 by mouth once daily as needed for pain control 30 tablet 0  . hydrocortisone 2.5 % ointment Apply topically 3 (three) times daily. To leg around wound. Do not apply to broken skin. 30 g 0  . insulin glargine (LANTUS) 100 UNIT/ML injection Inject 40 Units into the skin at bedtime.     . isosorbide mononitrate (IMDUR) 30 MG 24 hr tablet Take 30 mg by mouth every morning.    . magnesium oxide (MAG-OX) 400 MG tablet Take 400 mg by mouth daily.    . metFORMIN (GLUCOPHAGE) 1000 MG tablet Take 1,000 mg by mouth 2 (two) times daily with a meal.    . metoprolol succinate (TOPROL-XL) 50 MG 24 hr tablet Take 1 tablet (50 mg total) by mouth as directed. Take 1/2 tab daily per PACE 12/22/14; Take with or immediately following a meal.    . nitroGLYCERIN (NITROSTAT) 0.4 MG SL tablet Place 0.4 mg under the tongue every 5 (five) minutes as needed for chest pain.     Marland Kitchen omeprazole (PRILOSEC) 20 MG capsule Take 20 mg by mouth daily.    Marland Kitchen PARoxetine (PAXIL) 40 MG tablet Take 40 mg by mouth every morning.    . saccharomyces boulardii (FLORASTOR) 250 MG capsule Take 1 capsule (250 mg total) by mouth 2 (two) times daily. 60 capsule 2   No current facility-administered medications for this visit.    Allergies:   Penicillins    Social History:  The patient  reports that he quit smoking about 4 years ago. His smoking use included Cigarettes. He has a 42 pack-year smoking history. He quit smokeless tobacco use about 4 years ago. He reports that he drinks alcohol. He reports that he does not use illicit drugs.   Family History:  The patient's family history includes Breast cancer in his maternal grandmother; Heart attack in his maternal grandmother; Heart disease in his brother, father, maternal grandmother, mother, and sister; Hypertension in his sister; Peripheral vascular disease in his father; Stomach cancer in his maternal uncle. There is no history of Colon cancer or Stroke.    ROS:   Please see the history of  present illness.   Review of Systems  Cardiovascular: Positive for chest pain.  Gastrointestinal: Positive for hematochezia.       Hemorrhoidal bleeding  All other systems reviewed and are negative.    PHYSICAL EXAM: VS:  BP 110/60 mmHg  Pulse 84  Ht 5' (1.524 m)  Wt 208 lb (94.348 kg)  BMI 40.62 kg/m2  SpO2 97%    Wt Readings from Last 3 Encounters:  04/28/15 208 lb (94.348 kg)  04/23/15 209 lb (94.802 kg)  04/16/15 209 lb 6.4 oz (94.983 kg)     GEN: WN/WD male, in no acute distress HEENT: normal Neck: I cannot assess JVD, no masses Cardiac:  Normal S1/S2, RRR; no murmur ,  no rubs or gallops, trace bilateral LE edema Respiratory:  Decreased breath sounds bilaterally, no wheezing. GI: Large ventral hernia noted, soft, + BS MS: no deformity or atrophy Skin: warm and dry  Neuro:  CNs II-XII intact, Strength and sensation are intact Psych: Normal affect   EKG:  EKG is ordered today.  It demonstrates:   NSR, HR 84, RBBB,nonspecific ST-T wave changes, no change from prior tracing.    Recent Labs: 10/26/2014: B Natriuretic Peptide 345.2* 11/01/2014: Magnesium 2.5 11/03/2014: Hemoglobin 10.3*; Platelets 329 12/15/2014: ALT 14; BUN 43*; Creatinine, Ser 1.29; Potassium 3.9; Pro B Natriuretic peptide (BNP) 290.0*; Sodium 130*    Lipid Panel    Component Value Date/Time   CHOL 124 11/10/2012 0255   TRIG 112 11/10/2012 0255   HDL 24* 11/10/2012 0255   CHOLHDL 5.2 11/10/2012 0255   VLDL 22 11/10/2012 0255   LDLCALC 78 11/10/2012 0255   LDLDIRECT 54 08/23/2012 1447    Recent Labs  10/26/14 0329 10/26/14 1610 10/27/14 0357 10/27/14 1445 10/27/14 1957 10/27/14 2350 10/28/14 0228  TROPONINI 0.12* 5.92* 3.44* 3.41* 5.19* 4.90* 6.47*       ASSESSMENT AND PLAN:  Hx of NSTEMI:    The patient had a fairly substantial rise in his troponin level in 10/2014 while hospitalized for acute, multiple medical illnesses. At that time, it was decided to treat him medically. He did  not have many symptoms to suggest angina. A follow-up echocardiogram demonstrated normal LV function. Now he must undergo vascular surgery. His history is somewhat difficult as he does suffer from some developmental delays. He has had some chest pain.  He does have a high risk Myoview with a large area of inferior ischemia. This study was also reviewed by Dr. Acie Fredrickson. The recommendation is to proceed with cardiac catheterization. Risks and benefits of cardiac catheterization have been discussed with the patient.  These include bleeding, infection, kidney damage, stroke, heart attack, death.  The patient understands these risks and is willing to proceed. Of note, he has a lot of transportation issues.  He will likely need to be kept overnight for observation after his LHC.   Chronic diastolic CHF (congestive heart failure):  Volume appears to be stable. Continue current Rx.     Paroxysmal atrial fibrillation:  Maintaining NSR. He remains on Pradaxa for anticoagulation. He has a hx of CVA.  His Creatinine Clearance is 76.  I reviewed his case with our PharmD.  Recommendation is to hold Pradaxa 1-2 days prior to his procedure and resume it within 24 hours post cath.  Given his hx of CVA and carotid stenosis, I have recommended he hold his Pradaxa for 1 day prior to cath.     Essential hypertension:  Controlled.  HYPERCHOLESTEROLEMIA:  Continue statin.    Carotid stenosis, bilateral:  L CEA pending.  Medication Adjustments: Current medicines are reviewed at length with the patient today.  Concerns regarding medicines are as outlined above.  The following changes have been made:   Discontinued Medications   No medications on file   Modified Medications   No medications on file   New Prescriptions   No medications on file   Labs/ tests ordered today include:  No orders of the defined types were placed in this encounter.    Disposition:    FU with me 2 weeks post cath     Signed, Versie Starks, MHS 04/28/2015 1:30 PM    [address], [place], [place]  [address] Phone: [phone]; Fax: [phone]

## 2015-04-30 NOTE — Telephone Encounter (Signed)
LMTCB

## 2015-04-30 NOTE — Telephone Encounter (Signed)
Routed back to Triage in case patient returns call.

## 2015-04-30 NOTE — Discharge Instructions (Signed)
Radial Site Care °Refer to this sheet in the next few weeks. These instructions provide you with information on caring for yourself after your procedure. Your caregiver may also give you more specific instructions. Your treatment has been planned according to current medical practices, but problems sometimes occur. Call your caregiver if you have any problems or questions after your procedure. °HOME CARE INSTRUCTIONS °· You may shower the day after the procedure. Remove the bandage (dressing) and gently wash the site with plain soap and water. Gently pat the site dry. °· Do not apply powder or lotion to the site. °· Do not submerge the affected site in water for 3 to 5 days. °· Inspect the site at least twice daily. °· Do not flex or bend the affected arm for 24 hours. °· No lifting over 5 pounds (2.3 kg) for 5 days after your procedure. °· Do not drive home if you are discharged the same day of the procedure. Have someone else drive you. °· You may drive 24 hours after the procedure unless otherwise instructed by your caregiver. °· Do not operate machinery or power tools for 24 hours. °· A responsible adult should be with you for the first 24 hours after you arrive home. °What to expect: °· Any bruising will usually fade within 1 to 2 weeks. °· Blood that collects in the tissue (hematoma) may be painful to the touch. It should usually decrease in size and tenderness within 1 to 2 weeks. °SEEK IMMEDIATE MEDICAL CARE IF: °· You have unusual pain at the radial site. °· You have redness, warmth, swelling, or pain at the radial site. °· You have drainage (other than a small amount of blood on the dressing). °· You have chills. °· You have a fever or persistent symptoms for more than 72 hours. °· You have a fever and your symptoms suddenly get worse. °· Your arm becomes pale, cool, tingly, or numb. °· You have heavy bleeding from the site. Hold pressure on the site. °Document Released: 08/27/2010 Document Revised:  10/17/2011 Document Reviewed: 08/27/2010 °[employer]® Patient Information ©2015 [employer]. This information is not intended to replace advice given to you by your health care provider. Make sure you discuss any questions you have with your health care provider. ° °

## 2015-05-01 ENCOUNTER — Encounter (HOSPITAL_COMMUNITY): Payer: Self-pay | Admitting: Interventional Cardiology

## 2015-05-04 ENCOUNTER — Encounter: Payer: Self-pay | Admitting: Vascular Surgery

## 2015-05-05 ENCOUNTER — Encounter (HOSPITAL_COMMUNITY): Admission: RE | Payer: Self-pay | Source: Ambulatory Visit

## 2015-05-05 ENCOUNTER — Inpatient Hospital Stay (HOSPITAL_COMMUNITY)
Admission: RE | Admit: 2015-05-05 | Payer: Medicare (Managed Care) | Source: Ambulatory Visit | Admitting: Vascular Surgery

## 2015-05-05 SURGERY — ENDARTERECTOMY, CAROTID
Anesthesia: General | Site: Neck | Laterality: Left

## 2015-05-06 ENCOUNTER — Other Ambulatory Visit: Payer: Self-pay | Admitting: *Deleted

## 2015-05-06 ENCOUNTER — Encounter: Payer: Self-pay | Admitting: Surgery

## 2015-05-06 ENCOUNTER — Encounter: Payer: Self-pay | Admitting: Vascular Surgery

## 2015-05-06 ENCOUNTER — Ambulatory Visit: Payer: Medicare (Managed Care) | Admitting: Vascular Surgery

## 2015-05-06 ENCOUNTER — Ambulatory Visit (INDEPENDENT_AMBULATORY_CARE_PROVIDER_SITE_OTHER): Payer: Medicare (Managed Care) | Admitting: Vascular Surgery

## 2015-05-06 ENCOUNTER — Institutional Professional Consult (permissible substitution) (INDEPENDENT_AMBULATORY_CARE_PROVIDER_SITE_OTHER): Payer: Medicare (Managed Care) | Admitting: Surgery

## 2015-05-06 VITALS — BP 120/75 | HR 82 | Resp 16 | Ht 60.0 in | Wt 211.0 lb

## 2015-05-06 VITALS — BP 130/75 | HR 85 | Temp 98.2°F | Ht 60.0 in | Wt 211.0 lb

## 2015-05-06 DIAGNOSIS — I2510 Atherosclerotic heart disease of native coronary artery without angina pectoris: Secondary | ICD-10-CM

## 2015-05-06 DIAGNOSIS — I6523 Occlusion and stenosis of bilateral carotid arteries: Secondary | ICD-10-CM

## 2015-05-06 DIAGNOSIS — I779 Disorder of arteries and arterioles, unspecified: Secondary | ICD-10-CM | POA: Diagnosis not present

## 2015-05-06 DIAGNOSIS — I739 Peripheral vascular disease, unspecified: Secondary | ICD-10-CM

## 2015-05-06 NOTE — Progress Notes (Signed)
[facility] [place]  Patient name: [name] MRN: [id number] DOB: 1956-07-26 Sex: male  REASON FOR VISIT: To discuss combined CABG/ CEA  HPI: [name] is a 59 y.o. male, who is Single, who I last saw on 04/08/2015 with bilateral carotid stenoses. His duplex scan showed that the left carotid stenosis had progressed to greater than 80%. He had a 60-79% right carotid stenosis. I recommended left carotid endarterectomy in order to lower his risk of future stroke. However, the patient had been admitted in March with congestive heart failure and had a non-ST MI at that time. He was admitting to some occasional chest pain and left shoulder pain and for this reason I sent him for preoperative cardiac evaluation.  He had an abnormal nuclear stress test which prompted cardiac catheterization. This was performed on 04/30/2015 and showed severe diffuse heavily calcified three-vessel coronary artery disease. He has been referred to [facility] for consideration for surgical revascularization.  Since I saw him last he denies any history of stroke, TIAs, expressive or receptive aphasia, or amaurosis fugax.  He is on aspirin and is on a statin. He is also on Pradaxa for atrial fibrillation. My plan was to hold his Pradaxa for 3 days prior to surgery.    Past Medical History  Diagnosis Date  . Hypertension   . Diabetes mellitus   . Mental retardation     Sister helps to take care of him and takes him to appts  . Tobacco user     Smokes 1ppd for multiple years.  Quit after hosp 09/2010.  Marland Kitchen Anemia     History of Iron Def Anemia  . Anxiety   . Hyperlipidemia   . PAF (paroxysmal atrial fibrillation) 06/2009    CHADS score 2 (HTN, DM), was not on coumadin, but now on Pradaxa for Afib  . Lung nodule   . Chronic low back pain   . CVA (cerebral infarction) 09/2010    Bilateral with Left > Right  . Abdominal hernia     Chronic, not a good surgical candidate  . Carotid artery  occlusion   . Obesity   . COPD (chronic obstructive pulmonary disease)   . Abscess, abdomen 12/31/2010    Referred to Wound Care in 01/2011 because of multiple abd abscess with VERY large ventral hernia (please look at image of CT abd/pelvis 09/2010).  Because of hernia I was hesitant to I&D.      Marland Kitchen GERD (gastroesophageal reflux disease)   . Pneumonia     hx of  . Chronic diastolic CHF (congestive heart failure)     takes Lasix  . Itching     all over body; pt scratches and has sores on bilateral arms and abdomen  . Hx of echocardiogram     Echo 5/16:  Mild LVH, EF 55%, indeterm. diast function, WMA could not be ruled out, MAC, trivial MR, mild LAE, normal RVF  . Myocardial infarction 2016 ?    Heart attack  (  Per  pt. )  . History of nuclear stress test     Myoview 9/16:  Inferior, apical and inf-lateral ischemia; not gated; High Risk   Family History  Problem Relation Age of Onset  . Heart disease Mother   . Hypertension Sister   . Heart disease Sister   . Heart disease Brother   . Heart disease Father   . Peripheral vascular disease Father   . Heart disease Maternal Grandmother   .  Heart attack Maternal Grandmother   . Colon cancer Neg Hx   . Breast cancer Maternal Grandmother   . Stomach cancer Maternal Uncle   . Stroke Neg Hx    SOCIAL HISTORY: Social History   Social History  . Marital Status: Single    Spouse Name: N/A  . Number of Children: 0  . Years of Education: N/A   Occupational History  .     Social History Main Topics  . Smoking status: Former Smoker -- 1.00 packs/day for 42 years    Types: Cigarettes    Quit date: 11/10/2010  . Smokeless tobacco: Former Systems developer    Quit date: 10/02/2010  . Alcohol Use: 0.0 oz/week    0 Standard drinks or equivalent per week     Comment: rare  . Drug Use: No  . Sexual Activity: Not on file   Other Topics Concern  . Not on file   Social History Narrative   Released from prison in 2003 after serving 2 years for  indecency with a minor.    He smokes cigarettes greater than 30 years, also smoked a pipe.  Quit tobacco 09/2010.   Denies EtOH and drugs.    Allergies  Allergen Reactions  . Penicillins Hives and Nausea And Vomiting   Current Outpatient Prescriptions  Medication Sig Dispense Refill  . aspirin EC 81 MG EC tablet Take 1 tablet (81 mg total) by mouth daily. 90 tablet 3  . atorvastatin (LIPITOR) 80 MG tablet Take 1 tablet (80 mg total) by mouth daily at 6 PM. 45 tablet 0  . Cholecalciferol (VITAMIN D3) 1000 UNITS CAPS Take 1,000 Units by mouth daily.    . clonazePAM (KLONOPIN) 0.5 MG tablet Take 1 tablet (0.5 mg total) by mouth at bedtime. 30 tablet 0  . dabigatran (PRADAXA) 150 MG CAPS Take 150 mg by mouth every 12 (twelve) hours.    Marland Kitchen diltiazem (CARDIZEM CD) 180 MG 24 hr capsule Take 1 capsule (180 mg total) by mouth daily. 30 capsule 0  . ferrous gluconate (FERGON) 324 MG tablet Take 1 tablet (324 mg total) by mouth 2 (two) times daily with a meal. 60 tablet 0  . furosemide (LASIX) 80 MG tablet Take 1 tablet (80 mg total) by mouth 2 (two) times daily. 60 tablet 0  . gabapentin (NEURONTIN) 300 MG capsule Take 1 capsule (300 mg total) by mouth 2 (two) times daily. 60 capsule 0  . HYDROcodone-acetaminophen (NORCO/VICODIN) 5-325 MG per tablet 1 by mouth once daily as needed for pain control 30 tablet 0  . hydrocortisone 2.5 % ointment Apply topically 3 (three) times daily. To leg around wound. Do not apply to broken skin. 30 g 0  . insulin glargine (LANTUS) 100 UNIT/ML injection Inject 60 Units into the skin at bedtime.     . isosorbide mononitrate (IMDUR) 30 MG 24 hr tablet Take 30 mg by mouth every morning.    . magnesium oxide (MAG-OX) 400 MG tablet Take 400 mg by mouth daily.    . metFORMIN (GLUCOPHAGE) 1000 MG tablet Take 1,000 mg by mouth 2 (two) times daily with a meal.    . metoprolol succinate (TOPROL-XL) 50 MG 24 hr tablet Take 1 tablet (50 mg total) by mouth as directed. Take 1/2 tab  daily per [facility] 12/22/14; Take with or immediately following a meal.    . nitroGLYCERIN (NITROSTAT) 0.4 MG SL tablet Place 0.4 mg under the tongue every 5 (five) minutes as needed for chest pain.     Marland Kitchen  omeprazole (PRILOSEC) 20 MG capsule Take 20 mg by mouth daily.    Marland Kitchen PARoxetine (PAXIL) 40 MG tablet Take 40 mg by mouth every morning.    . saccharomyces boulardii (FLORASTOR) 250 MG capsule Take 1 capsule (250 mg total) by mouth 2 (two) times daily. 60 capsule 2   No current facility-administered medications for this visit.   REVIEW OF SYSTEMS:  [X]  denotes positive finding, [ ]  denotes negative finding Cardiac  Comments:  Chest pain or chest pressure: X   Shortness of breath upon exertion: X   Short of breath when lying flat:    Irregular heart rhythm:        Vascular    Pain in calf, thigh, or hip brought on by ambulation:    Pain in feet at night that wakes you up from your sleep:     Blood clot in your veins:    Leg swelling:         Pulmonary    Oxygen at home:    Productive cough:     Wheezing:         Neurologic    Sudden weakness in arms or legs:     Sudden numbness in arms or legs:     Sudden onset of difficulty speaking or slurred speech:    Temporary loss of vision in one eye:     Problems with dizziness:         Gastrointestinal    Blood in stool:     Vomited blood:         Genitourinary    Burning when urinating:     Blood in urine:        Psychiatric    Major depression:         Hematologic    Bleeding problems:    Problems with blood clotting too easily:        Skin    Rashes or ulcers:        Constitutional    Fever or chills:      PHYSICAL EXAM: Filed Vitals:   05/06/15 1449  BP: 130/75  Pulse: 85  Temp: 98.2 F (36.8 C)  TempSrc: Oral  Height: 5' (1.524 m)  Weight: 211 lb (95.709 kg)  SpO2: 95%   GENERAL: The patient is a well-nourished male, in no acute distress. The vital signs are documented above. CARDIAC: There is a regular rate  and rhythm.  VASCULAR: I do not detect carotid bruits. I cannot palpate pedal pulses. Both feet are warm and well-perfused. PULMONARY: There is good air exchange bilaterally without wheezing or rales. ABDOMEN: Soft and non-tender with normal pitched bowel sounds.  MUSCULOSKELETAL: There are no major deformities or cyanosis. NEUROLOGIC: No focal weakness or paresthesias are detected. SKIN: There are no ulcers or rashes noted. PSYCHIATRIC: The patient has a normal affect.  DATA:  His carotid duplex scan on 04/08/2015 shows a greater than 80% left carotid stenosis. Peak systolic velocity in the proximal left internal carotid artery is 440 cm/s with an end-diastolic velocity of 563 cm/s. There is a 60-79% right carotid stenosis. The right vertebral artery is patent with antegrade flow in the left vertebral artery is not visualized.  MEDICAL ISSUES:  COMBINED CORONARY ARTERY DISEASE WITH GREATER THAN 80% ASYMPTOMATIC LEFT CAROTID STENOSIS: Pending his evaluation by [facility], I would recommend combined left carotid endarterectomy at the time of his CABG in order to lower his risk of perioperative and future stroke. We have previously discussed the indications  for surgery and the potential complications and he is agreeable to proceed. He is on aspirin. He is on a statin. His Pradaxa will have to be held prior to surgery.  [name] [facility] [place]: [phone]

## 2015-05-07 ENCOUNTER — Other Ambulatory Visit (HOSPITAL_COMMUNITY): Payer: Self-pay | Admitting: Respiratory Therapy

## 2015-05-07 ENCOUNTER — Encounter: Payer: Self-pay | Admitting: Surgery

## 2015-05-07 NOTE — Progress Notes (Signed)
Cardiothoracic Surgery Consultation   PCP is [name], MD Referring Provider is Belva Crome, MD  Chief Complaint  Patient presents with  . Coronary Artery Disease    eval fo CABG...cathed 04/30/15    HPI:  The patient is a 59 year old gentleman with a history of DM with complications, hypertension, hyperlipidemia, PAF, previous smoking, prior stroke and cerebrovascular disease and mental retardation who has been followed by Dr. [name] for carotid artery disease. He has had progression of his left carotid stenosis to greater than 80% and left CEA was recommended. He had been admitted in March 2016 with CHF and a NSTEMI and has occasional chest pain and left shoulder pain. He had an abnormal nuclear stress test and catheterization shows severe multi-vessel coronary disease with diffusely diseased and calcified coronary arteries.  Past Medical History  Diagnosis Date  . Hypertension   . Diabetes mellitus   . Mental retardation     Sister helps to take care of him and takes him to appts  . Tobacco user     Smokes 1ppd for multiple years.  Quit after hosp 09/2010.  Marland Kitchen Anemia     History of Iron Def Anemia  . Anxiety   . Hyperlipidemia   . PAF (paroxysmal atrial fibrillation) 06/2009    CHADS score 2 (HTN, DM), was not on coumadin, but now on Pradaxa for Afib  . Lung nodule   . Chronic low back pain   . CVA (cerebral infarction) 09/2010    Bilateral with Left > Right  . Abdominal hernia     Chronic, not a good surgical candidate  . Carotid artery occlusion   . Obesity   . COPD (chronic obstructive pulmonary disease)   . Abscess, abdomen 12/31/2010    Referred to Wound Care in 01/2011 because of multiple abd abscess with VERY large ventral hernia (please look at image of CT abd/pelvis 09/2010).  Because of hernia I was hesitant to I&D.      Marland Kitchen GERD (gastroesophageal reflux disease)   . Pneumonia     hx of  . Chronic diastolic CHF (congestive heart  failure)     takes Lasix  . Itching     all over body; pt scratches and has sores on bilateral arms and abdomen  . Hx of echocardiogram     Echo 5/16:  Mild LVH, EF 55%, indeterm. diast function, WMA could not be ruled out, MAC, trivial MR, mild LAE, normal RVF  . Myocardial infarction 2016 ?    Heart attack  (  Per  pt. )  . History of nuclear stress test     Myoview 9/16:  Inferior, apical and inf-lateral ischemia; not gated; High Risk    Past Surgical History  Procedure Laterality Date  . Carotid arteriogram  10/2010    30% right ICA stenosis, 40% left ICA stenosis   . Transesophageal echocardiogram  09/2010    No ASD or PFO. EF 60-65%.  Normal systolic function. No evidence of thrombus.   . Transthoracic echocardiogram  09/2010     The cavity size was normal. Systolic function was vigorous.  EF 65-70%.  Normal wall funciton.   . Debridment of decubitus ulcer Right 02/13/2013  . I&d extremity Right 02/13/2013    Procedure: IRRIGATION AND DEBRIDEMENT FOOT ULCER;  Surgeon: Johnny Bridge, MD;  Location: [place];  Service: Orthopedics;  Laterality: Right;  PULSE LAVAGE  . Cataract extraction, bilateral    . Arm  surgery Left     as a child  . Amputation Right 03/29/2013    Procedure: AMPUTATION RAY;  Surgeon: Newt Minion, MD;  Location: [place];  Service: Orthopedics;  Laterality: Right;  Right Foot 3rd and Possible 4th Ray Amputation  . Multiple tooth extractions    . Amputation Right 04/23/2013    Procedure: AMPUTATION RIGHT MID-FOOT;  Surgeon: Newt Minion, MD;  Location: [place];  Service: Orthopedics;  Laterality: Right;  . Esophagogastroduodenoscopy (egd) with propofol N/A 06/10/2014    Procedure: ESOPHAGOGASTRODUODENOSCOPY (EGD) WITH PROPOFOL;  Surgeon: Jerene Bears, MD;  Location: [facility];  Service: Gastroenterology;  Laterality: N/A;  . Colonoscopy with propofol N/A 06/10/2014    Procedure: COLONOSCOPY WITH PROPOFOL;  Surgeon: Jerene Bears, MD;  Location: [facility];  Service:  Gastroenterology;  Laterality: N/A;  . Givens capsule study N/A 07/09/2014    Procedure: GIVENS CAPSULE STUDY;  Surgeon: Jerene Bears, MD;  Location: [facility];  Service: Gastroenterology;  Laterality: N/A;  . Cardiac catheterization N/A 04/30/2015    Procedure: Left Heart Cath and Coronary Angiography;  Surgeon: Belva Crome, MD;  Location: [facility];  Service: Cardiovascular;  Laterality: N/A;    Family History  Problem Relation Age of Onset  . Heart disease Mother   . Hypertension Sister   . Heart disease Sister   . Heart disease Brother   . Heart disease Father   . Peripheral vascular disease Father   . Heart disease Maternal Grandmother   . Heart attack Maternal Grandmother   . Colon cancer Neg Hx   . Breast cancer Maternal Grandmother   . Stomach cancer Maternal Uncle   . Stroke Neg Hx     Social History Social History  Substance Use Topics  . Smoking status: Former Smoker -- 1.00 packs/day for 42 years    Types: Cigarettes    Quit date: 11/10/2010  . Smokeless tobacco: Former Systems developer    Quit date: 10/02/2010  . Alcohol Use: 0.0 oz/week    0 Standard drinks or equivalent per week     Comment: rare    Current Outpatient Prescriptions  Medication Sig Dispense Refill  . aspirin EC 81 MG EC tablet Take 1 tablet (81 mg total) by mouth daily. 90 tablet 3  . atorvastatin (LIPITOR) 80 MG tablet Take 1 tablet (80 mg total) by mouth daily at 6 PM. 45 tablet 0  . Cholecalciferol (VITAMIN D3) 1000 UNITS CAPS Take 1,000 Units by mouth daily.    . clonazePAM (KLONOPIN) 0.5 MG tablet Take 1 tablet (0.5 mg total) by mouth at bedtime. 30 tablet 0  . dabigatran (PRADAXA) 150 MG CAPS Take 150 mg by mouth every 12 (twelve) hours.    Marland Kitchen diltiazem (CARDIZEM CD) 180 MG 24 hr capsule Take 1 capsule (180 mg total) by mouth daily. 30 capsule 0  . furosemide (LASIX) 80 MG tablet Take 1 tablet (80 mg total) by mouth 2 (two) times daily. 60 tablet 0  . gabapentin (NEURONTIN) 300 MG  capsule Take 1 capsule (300 mg total) by mouth 2 (two) times daily. 60 capsule 0  . HYDROcodone-acetaminophen (NORCO/VICODIN) 5-325 MG per tablet 1 by mouth once daily as needed for pain control 30 tablet 0  . hydrocortisone 2.5 % ointment Apply topically 3 (three) times daily. To leg around wound. Do not apply to broken skin. 30 g 0  . insulin glargine (LANTUS) 100 UNIT/ML injection Inject 60 Units into the skin at bedtime.     Marland Kitchen  isosorbide mononitrate (IMDUR) 30 MG 24 hr tablet Take 30 mg by mouth every morning.    . magnesium oxide (MAG-OX) 400 MG tablet Take 400 mg by mouth daily.    . metFORMIN (GLUCOPHAGE) 1000 MG tablet Take 1,000 mg by mouth 2 (two) times daily with a meal.    . metoprolol succinate (TOPROL-XL) 50 MG 24 hr tablet Take 1 tablet (50 mg total) by mouth as directed. Take 1/2 tab daily per PACE 12/22/14; Take with or immediately following a meal.    . nitroGLYCERIN (NITROSTAT) 0.4 MG SL tablet Place 0.4 mg under the tongue every 5 (five) minutes as needed for chest pain.     Marland Kitchen omeprazole (PRILOSEC) 20 MG capsule Take 20 mg by mouth daily.    Marland Kitchen PARoxetine (PAXIL) 40 MG tablet Take 40 mg by mouth every morning.    . saccharomyces boulardii (FLORASTOR) 250 MG capsule Take 1 capsule (250 mg total) by mouth 2 (two) times daily. 60 capsule 2  . ferrous gluconate (FERGON) 324 MG tablet Take 1 tablet (324 mg total) by mouth 2 (two) times daily with a meal. 60 tablet 0   No current facility-administered medications for this visit.    Allergies  Allergen Reactions  . Penicillins Hives and Nausea And Vomiting    Review of Systems  Constitutional: Positive for fatigue. Negative for activity change and appetite change.  HENT: Negative.        Dentures  Eyes:       Glasses  Respiratory: Negative for shortness of breath.        Uses home oxygen at night 2L  Cardiovascular: Positive for chest pain. Negative for palpitations and leg swelling.  Gastrointestinal: Negative.     Endocrine: Negative.   Genitourinary: Negative.   Musculoskeletal: Negative.   Skin: Negative.   Allergic/Immunologic: Negative.   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  Hematological: Bruises/bleeds easily.  Psychiatric/Behavioral: The patient is nervous/anxious.     BP 120/75 mmHg  Pulse 82  Resp 16  Ht 5' (1.524 m)  Wt 211 lb (95.709 kg)  BMI 41.21 kg/m2  SpO2 93% Physical Exam  Constitutional: He is oriented to person, place, and time.  Chronically ill-appearing in no distress  HENT:  Head: Normocephalic and atraumatic.  Mouth/Throat: Oropharynx is clear and moist.  Eyes: EOM are normal. Pupils are equal, round, and reactive to light.  Neck: Normal range of motion. Neck supple. No JVD present. No thyromegaly present.  Cardiovascular: Normal rate, regular rhythm, normal heart sounds and intact distal pulses.  Exam reveals no gallop and no friction rub.   No murmur heard. Pulmonary/Chest: Effort normal and breath sounds normal. No respiratory distress.  Abdominal: Soft. Bowel sounds are normal. There is no tenderness.  Very large ventral abdominal hernia that looks like a panniculus.  Musculoskeletal: Normal range of motion. He exhibits no edema.  Right transmetatarsal amputation  Lymphadenopathy:    He has no cervical adenopathy.  Neurological: He is alert and oriented to person, place, and time. He has normal strength. No cranial nerve deficit or sensory deficit.  Skin: Skin is warm and dry.  Abrasions over shins.  Psychiatric: He has a normal mood and affect. His behavior is normal.  Has good grasp of events and follows directions well. Has a good memory for what I have told him.     Diagnostic Tests:  Conclusion    1. Ost LAD to Mid LAD  lesion, 95% stenosed. 2. Ost 1st Diag to 1st Diag lesion, 100% stenosed. 3. Prox RCA to Dist RCA lesion, 100% stenosed. 4. Ost RCA lesion, 80% stenosed. 5. RPDA lesion, 85%  stenosed. 6. Dist RCA lesion, 100% stenosed. 7. Ost 3rd Mrg lesion, 90% stenosed. 8. Prox Cx to Mid Cx lesion, 70% stenosed. 9. Mid Cx lesion, 65% stenosed. 10. Ost Ramus to Ramus( or OM lesion, 65% stenosed. 11. Ost 2nd Diag to 2nd Diag lesion, 85% stenosed. 12. Post Atrio lesion, 100% stenosed. 65. Dist LAD lesion, 40% stenosed.   Severe, diffuse, and heavily calcified 3 vessel coronary disease.  Total occlusion of the mid RCA and distal RCA. The right coronary fills by collaterals from the LAD and circumflex. High-grade segmental proximal to mid LAD (80%), total occlusion of diagonal 1 (small branch), severe diffuse proximal disease in diagonal 2 (very small vessel), moderate to moderately severe first obtuse marginal (Ramus) stenosis, 90% ostial third obtuse marginal, diffuse 50-70% proximal and mid circumflex.  Significant left ventricular dysfunction with inferior wall severe hypokinesis. Depressed overall LV function with EF 35%.   RECOMMENDATIONS:   Consider surgical revascularization, prior to carotid endarterectomy or simultaneously.  Further management per primary cardiologist, Dr. Acie Fredrickson.  Continue current medical regimen.     Coronary Findings    Dominance: Right   Left Anterior Descending   . Ost LAD to Mid LAD lesion, 95% stenosed. The lesion is type C calcified tubular eccentric ulcerative .   Marland Kitchen Dist LAD lesion, 40% stenosed. diffuse eccentric .   Marland Kitchen [employer], production   . Ost 1st Diag to 1st Diag lesion, 100% stenosed. The lesion is type C .   . Second Diagonal Branch   The vessel is small in size.   Colon Flattery 2nd Diag to 2nd Diag lesion, 85% stenosed. The lesion is type C .     Ramus Intermedius   . Ost Ramus to Ramus lesion, 65% stenosed. The lesion is type C .     Left Circumflex  The vessel is small .   Marland Kitchen Prox Cx to Mid Cx lesion, 70% stenosed. The lesion is type C calcified eccentric .   Marland Kitchen Mid Cx lesion, 65% stenosed. The lesion is type C  calcified eccentric .   Marland Kitchen First Obtuse Marginal Branch   The vessel is small in size.   Marland Kitchen [employer]   . Ost 2nd Mrg lesion, 90% stenosed. calcified eccentric .   Marland Kitchen Third Obtuse Marginal Branch   The vessel is small in size.     Right Coronary Artery   . Ost RCA lesion, 80% stenosed. The lesion is type C tubular eccentric .   Marland Kitchen Prox RCA to Dist RCA lesion, 100% stenosed. The lesion is type C .   Marland Kitchen Dist RCA lesion, 100% stenosed.   . Acute Marginal Branch   The vessel is small in size.   . Right Posterior Descending Artery   RPDA filled by collaterals from Dist LAD.   Marland Kitchen RPDA lesion, 85% stenosed. calcified eccentric .   Marland Kitchen Right Posterior Atrioventricular Branch   . Post Atrio lesion, 100% stenosed.   . Third Right Posterolateral   3rd RPLB filled by collaterals from Mid Cx.      Wall Motion                 Left Heart    Left Ventricle The left ventricle is enlarged. The left ventricular ejection fraction is 35-45% by visual  estimate.    Coronary Diagrams    Diagnostic Diagram            Implants    Name ID Temporary Type Supply   No information to display    Hemo Data       Most Recent Value   AO Systolic Pressure  734 mmHg   AO Diastolic Pressure  54 mmHg   AO Mean  73 mmHg   LV Systolic Pressure  193 mmHg   LV Diastolic Pressure  6 mmHg   LV EDP  15 mmHg   Arterial Occlusion Pressure Extended Systolic Pressure  790 mmHg   Arterial Occlusion Pressure Extended Diastolic Pressure  58 mmHg   Arterial Occlusion Pressure Extended Mean Pressure  81 mmHg   Left Ventricular Apex Extended Systolic Pressure  240 mmHg   Left Ventricular Apex Extended Diastolic Pressure  4 mmHg   Left Ventricular Apex Extended EDP Pressure  13 mmHg    Order-Level Documents:    There are no order-level documents.    Encounter-Level Documents - 04/28/15:      Scan on 05/02/2015 11:10 AM by Provider Default, MDScan on 05/02/2015 11:10  AM by Provider Default, MD     Scan on 05/02/2015 11:04 AM by Provider Default, MDScan on 05/02/2015 11:04 AM by Provider Default, MD     Scan on 04/30/2015 2:10 PM by Provider Default, MDScan on 04/30/2015 2:10 PM by Provider Default, MD     Electronic signature on 04/30/2015 8:41 AM    Signed    Electronically signed by Belva Crome, MD on 04/30/15 at 1438 EDT           *[facility]*            [address],  [address]              [phone]  ------------------------------------------------------------------- Transthoracic Echocardiography  Patient:  [name], [name] MR #:    [id number] Study Date: 12/18/2014 Gender:   M Age:    10 Height:   152.4 cm Weight:   104.3 kg BSA:    2.17 m^2 Pt. Status: Room:  Faylene Million, Scott T Joanna Puff T ATTENDING  Loralie Champagne, M.D. SONOGRAPHER Cindy Hazy, RDCS PERFORMING  [facility], Outpatient  cc:  ------------------------------------------------------------------- LV EF: 55%  ------------------------------------------------------------------- Indications:   I21.4 NSTEMI.  ------------------------------------------------------------------- History:  PMH: Acquired from the patient and from the patient&'s chart. PMH: NSTEMI. CHF. Atrial Fibrillation. CVA. COPD. Chest Pain. Anemia. Edema. Mental Retardation. Risk factors: Hypertension. Obese.  ------------------------------------------------------------------- Study Conclusions  - Left ventricle: The cavity size was normal. Wall thickness was increased in a pattern of mild LVH. Indeterminant diastolic function (atrial fibrillation). The estimated ejection fraction was 55%. Although no diagnostic regional wall motion abnormality was identified, this possibility cannot be completely excluded on the basis of this  study. - Aortic valve: There was no stenosis. - Mitral valve: Mildly calcified annulus. There was trivial regurgitation. - Left atrium: The atrium was mildly dilated. - Right ventricle: The cavity size was normal. Systolic function was normal. - Pulmonary arteries: No complete TR doppler jet so unable to estimate PA systolic pressure. - Inferior vena cava: The vessel was normal in size. The respirophasic diameter changes were in the normal range (>= 50%), consistent with normal central venous pressure.  Impressions:  - The patient was in rapid atrial fibrillation. Normal LV size  with mild LV hypertrophy. EF 55%. Normal RV size and systolic function. No significant valvular abnormalities.  Transthoracic echocardiography. M-mode, complete 2D, spectral Doppler, and color Doppler. Birthdate: Patient birthdate: [birth year]. Age: Patient is 59 yr old. Sex: Gender: male. BMI: 44.9 kg/m^2. Patient status: Outpatient. Study date: Study date: 12/18/2014. Study time: 02:06 PM. Location: [facility]  -------------------------------------------------------------------  ------------------------------------------------------------------- Left ventricle: The cavity size was normal. Wall thickness was increased in a pattern of mild LVH. Indeterminant diastolic function (atrial fibrillation). The estimated ejection fraction was 55%. Although no diagnostic regional wall motion abnormality was identified, this possibility cannot be completely excluded on the basis of this study.  ------------------------------------------------------------------- Aortic valve:  Trileaflet; mildly calcified leaflets. Doppler: There was no stenosis.  There was no regurgitation.  ------------------------------------------------------------------- Aorta: Aortic root: The aortic root was normal in size. Ascending aorta: The ascending aorta was normal in  size.  ------------------------------------------------------------------- Mitral valve:  Mildly calcified annulus. Doppler:  There was no evidence for stenosis.  There was trivial regurgitation.  Peak gradient (D): 10 mm Hg.  ------------------------------------------------------------------- Left atrium: The atrium was mildly dilated.  ------------------------------------------------------------------- Right ventricle: The cavity size was normal. Systolic function was normal.  ------------------------------------------------------------------- Pulmonic valve:  Structurally normal valve.  Cusp separation was normal. Doppler: Transvalvular velocity was within the normal range. There was no regurgitation.  ------------------------------------------------------------------- Tricuspid valve:  Doppler: There was no significant regurgitation.  ------------------------------------------------------------------- Pulmonary artery:  No complete TR doppler jet so unable to estimate PA systolic pressure.  ------------------------------------------------------------------- Right atrium: The atrium was normal in size.  ------------------------------------------------------------------- Pericardium: There was no pericardial effusion.  ------------------------------------------------------------------- Systemic veins: Inferior vena cava: The vessel was normal in size. The respirophasic diameter changes were in the normal range (>= 50%), consistent with normal central venous pressure.  ------------------------------------------------------------------- Measurements  Left ventricle              Value    Reference LV ID, ED, PLAX chordal         47  mm   43 - 52 LV ID, ES, PLAX chordal         35  mm   23 - 38 LV fx shortening, PLAX chordal (L)    26  %   >=29 LV PW thickness, ED           11  mm    --------- IVS/LV PW ratio, ED           1.09     <=1.3 Stroke volume, 2D            45  ml   --------- Stroke volume/bsa, 2D          21  ml/m^2 --------- LV e&', lateral              14.6 cm/s  --------- LV E/e&', lateral             10.82    --------- LV e&', medial              7.94 cm/s  --------- LV E/e&', medial             19.9     --------- LV e&', average              11.27 cm/s  --------- LV E/e&', average             14.02    ---------  Ventricular septum            Value  Reference IVS thickness, ED            12  mm   ---------  LVOT                   Value    Reference LVOT ID, S                19  mm   --------- LVOT area                2.84 cm^2  --------- LVOT mean velocity, S          80.5 cm/s  --------- LVOT VTI, S               15.7 cm   ---------  Aorta                  Value    Reference Aortic root ID, ED            33  mm   ---------  Left atrium               Value    Reference LA ID, A-P, ES              37  mm   --------- LA ID/bsa, A-P              1.71 cm/m^2 <=2.2 LA volume, S               44.2 ml   --------- LA volume/bsa, S             20.4 ml/m^2 --------- LA volume, ES, 1-p A4C          41.1 ml   --------- LA volume/bsa, ES, 1-p A4C        19  ml/m^2 --------- LA volume, ES, 1-p A2C          46.5 ml   --------- LA volume/bsa, ES, 1-p A2C        21.5 ml/m^2 ---------  Mitral valve               Value    Reference Mitral E-wave peak velocity       158  cm/s  --------- Mitral  A-wave peak velocity       110  cm/s  --------- Mitral deceleration time         173  ms   150 - 230 Mitral peak gradient, D         10  mm Hg --------- Mitral E/A ratio, peak          1.44     ---------  Right ventricle             Value    Reference RV s&', lateral, S            14.3 cm/s  ---------  Legend: (L) and (H) mark values outside specified reference range.  ------------------------------------------------------------------- Prepared and Electronically Authenticated by  Loralie Champagne, M.D. 2016-05-12T21:49:17       Impression:  He has severe multi-vessel coronary artery disease with a high risk nuclear stress showing a large mostly reversible inferior, apical and inferolateral defect. I agree that combined left CEA and CABG is indicated for prevention of stroke and MI and to improve his quality of life. I discussed the operative procedure with the patient including alternatives, benefits and risks; including but not limited to bleeding, blood transfusion, infection, stroke, myocardial infarction, graft failure, heart  block requiring a permanent pacemaker, organ dysfunction, and death.  [name] understands and agrees to proceed.    Plan:  We will schedule left CEA and CABG for 05/19/2015.   [name], MD [facility] [phone]

## 2015-05-08 ENCOUNTER — Other Ambulatory Visit: Payer: Self-pay

## 2015-05-13 NOTE — Progress Notes (Signed)
Cardiology Office Note   Date:  05/14/2015   ID:  [name], DOB Apr 18, 1956, MRN [id number]  PCP:  [name], MD  Cardiologist:  [name]   Vascular Surgeon:  Dr. Scot Dock   Chief Complaint  Patient presents with  . Follow-up    Post cardiac catheterization  . Coronary Artery Disease     History of Present Illness: [name] is a 59 y.o. male with a hx of PAF, HTN, diabetes, HL, prior stroke, carotid stenosis, COPD, mental retardation. He was evaluated by Dr. Acie Fredrickson in 08/2012 for chest pain. Myoview was low risk. Echocardiogram in 3/14 demonstrated normal LV function with mild diastolic dysfunction. He has a history of diabetic ulcer and underwent right midfoot amputation in 04/2013.  Admitted 10/2014 with acute hypoxic respiratory failure secondary to a/c diastolic CHF, AECOPD and AF with RVR in the setting of sepsis syndrome related to left lower extremity cellulitis from diabetic foot ulcer.  He had elevated troponins possibly related to demand ischemia.  He converted to sinus rhythm with rate control therapy.   Given his multiple comorbid conditions, it was decided to treat him conservatively for his non-STEMI.  In follow-up, continued conservative management was recommended for his recent non-STEMI. However, he was seen Dr. Scot Dock with vascular surgery and noted to have progressive carotid artery stenosis on the left.  He needs carotid endarterectomy.  He was seen for surgical clearance and he did complain of occasional atypical sounding chest pain.  Myoview was arranged for risk stratification.  This demonstrated inferior, apical, inferolateral ischemia. Study was felt to be high risk.   LHC 04/30/15 demonstrated 3v CAD.  CABG has been recommended.  He returns for FU.  Since his LHC, he is doing well. He had one episode of chest pain that resolved quickly.  He denies significant changes in his dyspnea.  No orthopnea, PND. He has chronic LE edema  without change.  No syncope.      Studies/Reports Reviewed Today:  LHC 04/30/15 LAD: Ostial and mid 95%, distal 40%, ostial D1 100%, ostial D2 85% RI: Ostial 65% LCx: Proximal to mid 70%, mid 65%, OM2 90% RCA: Ostial 80%, proximal to distal 100%, distal 100%, RPDA 85%, RPAVB 100% EF 35-45%     Myoview 04/23/15  No T wave inversion was noted during stress.  There was no ST segment deviation noted during stress.  Defect 1: There is a large defect of severe severity.  Findings consistent with ischemia.  This is a high risk study. Large, mostly reversible inferior, apical and inferolateral reversible perfusion defect suggestive of ischemia (SDS 10, Extent 37%). This study was not-gated due to atrial fibrillation. This is a high-risk study based on the size and severity of the perfusion defect.  Carotid US 8/16 R 60-79% L 80-99%  Echo 12/18/14 Mild LVH, EF 55%, wall motion abnormalities could not be excluded, MAC, trivial MR, mild LAE  Echo 10/16/12 Mild LVH, vigorous LVF, EF 65-70%, normal wall motion, grade 1 diastolic dysfunction  Nuclear study 09/06/12 Myoview scan with small apical defect Seen in horizontal images only. Cannot exclude tiny region of apical ischemia vs shift soft tissue. Otherwise normal perfusion. Overall low risk scan.  LV Ejection Fraction: 69%. LV Wall Motion: NL LV Function; NL Wall Motion    Past Medical History  Diagnosis Date  . Hypertension   . Diabetes mellitus   . Mental retardation     Sister helps to take care of him and takes  him to appts  . Tobacco user     Smokes 1ppd for multiple years.  Quit after hosp 09/2010.  Marland Kitchen Anemia     History of Iron Def Anemia  . Anxiety   . Hyperlipidemia   . PAF (paroxysmal atrial fibrillation) ([place]) 06/2009    CHADS score 2 (HTN, DM), was not on coumadin, but now on Pradaxa for Afib  . Lung nodule   . Chronic low back pain   . CVA (cerebral infarction) 09/2010    Bilateral with Left > Right  .  Abdominal hernia     Chronic, not a good surgical candidate  . Carotid artery occlusion   . Obesity   . COPD (chronic obstructive pulmonary disease) ([place])   . Abscess, abdomen ([place]) 12/31/2010    Referred to Wound Care in 01/2011 because of multiple abd abscess with VERY large ventral hernia (please look at image of CT abd/pelvis 09/2010).  Because of hernia I was hesitant to I&D.      Marland Kitchen GERD (gastroesophageal reflux disease)   . Pneumonia     hx of  . Chronic diastolic CHF (congestive heart failure) (HCC)     takes Lasix  . Itching     all over body; pt scratches and has sores on bilateral arms and abdomen  . Hx of echocardiogram     Echo 5/16:  Mild LVH, EF 55%, indeterm. diast function, WMA could not be ruled out, MAC, trivial MR, mild LAE, normal RVF  . Myocardial infarction [facility]) 2016 ?    Heart attack  (  Per  pt. )  . History of nuclear stress test     Myoview 9/16:  Inferior, apical and inf-lateral ischemia; not gated; High Risk    Past Surgical History  Procedure Laterality Date  . Carotid arteriogram  10/2010    30% right ICA stenosis, 40% left ICA stenosis   . Transesophageal echocardiogram  09/2010    No ASD or PFO. EF 60-65%.  Normal systolic function. No evidence of thrombus.   . Transthoracic echocardiogram  09/2010     The cavity size was normal. Systolic function was vigorous.  EF 65-70%.  Normal wall funciton.   . Debridment of decubitus ulcer Right 02/13/2013  . I&d extremity Right 02/13/2013    Procedure: IRRIGATION AND DEBRIDEMENT FOOT ULCER;  Surgeon: Johnny Bridge, MD;  Location: [place];  Service: Orthopedics;  Laterality: Right;  PULSE LAVAGE  . Cataract extraction, bilateral    . Arm surgery Left     as a child  . Amputation Right 03/29/2013    Procedure: AMPUTATION RAY;  Surgeon: Newt Minion, MD;  Location: [place];  Service: Orthopedics;  Laterality: Right;  Right Foot 3rd and Possible 4th Ray Amputation  . Multiple tooth extractions    . Amputation Right  04/23/2013    Procedure: AMPUTATION RIGHT MID-FOOT;  Surgeon: Newt Minion, MD;  Location: [place];  Service: Orthopedics;  Laterality: Right;  . Esophagogastroduodenoscopy (egd) with propofol N/A 06/10/2014    Procedure: ESOPHAGOGASTRODUODENOSCOPY (EGD) WITH PROPOFOL;  Surgeon: Jerene Bears, MD;  Location: [facility];  Service: Gastroenterology;  Laterality: N/A;  . Colonoscopy with propofol N/A 06/10/2014    Procedure: COLONOSCOPY WITH PROPOFOL;  Surgeon: Jerene Bears, MD;  Location: [facility];  Service: Gastroenterology;  Laterality: N/A;  . Givens capsule study N/A 07/09/2014    Procedure: GIVENS CAPSULE STUDY;  Surgeon: Jerene Bears, MD;  Location: [facility];  Service: Gastroenterology;  Laterality: N/A;  . Cardiac catheterization N/A 04/30/2015    Procedure: Left Heart Cath and Coronary Angiography;  Surgeon: Belva Crome, MD;  Location: [facility];  Service: Cardiovascular;  Laterality: N/A;     Current Outpatient Prescriptions  Medication Sig Dispense Refill  . aspirin EC 81 MG EC tablet Take 1 tablet (81 mg total) by mouth daily. 90 tablet 3  . atorvastatin (LIPITOR) 80 MG tablet Take 1 tablet (80 mg total) by mouth daily at 6 PM. 45 tablet 0  . Cholecalciferol (VITAMIN D3) 1000 UNITS CAPS Take 1,000 Units by mouth daily.    . clonazePAM (KLONOPIN) 0.5 MG tablet Take 1 tablet (0.5 mg total) by mouth at bedtime. 30 tablet 0  . dabigatran (PRADAXA) 150 MG CAPS capsule Take 1 capsule (150 mg total) by mouth every 12 (twelve) hours. 60 capsule 11  . diltiazem (CARDIZEM CD) 180 MG 24 hr capsule Take 1 capsule (180 mg total) by mouth daily. 30 capsule 0  . ferrous gluconate (FERGON) 324 MG tablet Take 1 tablet (324 mg total) by mouth 2 (two) times daily with a meal. 60 tablet 0  . furosemide (LASIX) 80 MG tablet Take 1 tablet (80 mg total) by mouth 2 (two) times daily. 60 tablet 0  . gabapentin (NEURONTIN) 300 MG capsule Take 1 capsule (300 mg total) by mouth 2 (two) times daily.  60 capsule 0  . HYDROcodone-acetaminophen (NORCO/VICODIN) 5-325 MG per tablet 1 by mouth once daily as needed for pain control 30 tablet 0  . hydrocortisone 2.5 % ointment Apply topically 3 (three) times daily. To leg around wound. Do not apply to broken skin. 30 g 0  . insulin glargine (LANTUS) 100 UNIT/ML injection Inject 60 Units into the skin at bedtime.     . isosorbide mononitrate (IMDUR) 30 MG 24 hr tablet Take 30 mg by mouth every morning.    . magnesium oxide (MAG-OX) 400 MG tablet Take 400 mg by mouth daily.    . metFORMIN (GLUCOPHAGE) 1000 MG tablet Take 1,000 mg by mouth 2 (two) times daily with a meal.    . metoprolol succinate (TOPROL-XL) 50 MG 24 hr tablet Take 1 tablet (50 mg total) by mouth as directed. Take 1/2 tab daily per PACE 12/22/14; Take with or immediately following a meal.    . nitroGLYCERIN (NITROSTAT) 0.4 MG SL tablet Place 0.4 mg under the tongue every 5 (five) minutes as needed for chest pain.     Marland Kitchen omeprazole (PRILOSEC) 20 MG capsule Take 20 mg by mouth daily.    Marland Kitchen PARoxetine (PAXIL) 40 MG tablet Take 40 mg by mouth every morning.    . saccharomyces boulardii (FLORASTOR) 250 MG capsule Take 1 capsule (250 mg total) by mouth 2 (two) times daily. 60 capsule 2   No current facility-administered medications for this visit.    Allergies:   Penicillins    Social History:  The patient  reports that he quit smoking about 4 years ago. His smoking use included Cigarettes. He has a 42 pack-year smoking history. He quit smokeless tobacco use about 4 years ago. He reports that he drinks alcohol. He reports that he does not use illicit drugs.   Family History:  The patient's family history includes Breast cancer in his maternal grandmother; Heart attack in his maternal grandmother; Heart disease in his brother, father, maternal grandmother, mother, and sister; Hypertension in his sister; Peripheral vascular disease in his father; Stomach cancer in his maternal uncle. There is  no  history of Colon cancer or Stroke.    ROS:   Please see the history of present illness.   Review of Systems  All other systems reviewed and are negative.    PHYSICAL EXAM: VS:  BP 118/58 mmHg  Pulse 73  Ht 5' (1.524 m)  Wt 210 lb 6.4 oz (95.437 kg)  BMI 41.09 kg/m2    Wt Readings from Last 3 Encounters:  05/14/15 210 lb 6.4 oz (95.437 kg)  05/06/15 211 lb (95.709 kg)  05/06/15 211 lb (95.709 kg)     GEN: WN/WD male, in no acute distress HEENT: normal Neck: I cannot assess JVD, no masses Cardiac:  Normal S1/S2, RRR; no murmur ,  no rubs or gallops, trace bilateral LE edema Respiratory:  Decreased breath sounds bilaterally, no wheezing. GI: Large ventral hernia noted, soft, + BS MS: no deformity or atrophy Skin: warm and dry  Neuro:  CNs II-XII intact, Strength and sensation are intact Psych: Normal affect   EKG:  EKG is no ordered today.  It demonstrates:   n/a   Recent Labs: 10/26/2014: B Natriuretic Peptide 345.2* 11/01/2014: Magnesium 2.5 12/15/2014: ALT 14; Pro B Natriuretic peptide (BNP) 290.0* 04/28/2015: Hemoglobin 12.1*; Platelets 337.0 04/30/2015: BUN 26*; Creatinine, Ser 1.29*; Potassium 3.0*; Sodium 135    Lipid Panel    Component Value Date/Time   CHOL 124 11/10/2012 0255   TRIG 112 11/10/2012 0255   HDL 24* 11/10/2012 0255   CHOLHDL 5.2 11/10/2012 0255   VLDL 22 11/10/2012 0255   LDLCALC 78 11/10/2012 0255   LDLDIRECT 54 08/23/2012 1447     ASSESSMENT AND PLAN:  1. CAD:    History of non-STEMI in 10/2014 while hospitalized for acute, multiple, medical illnesses.  Recent high risk Myoview with a large area of inferior ischemia.  [place] 9/22 demonstrated severe 3v CAD and reduced EF 35-45%.  He has already seen Dr. Cyndia Bent and he is scheduled for combined L CEA and CABG next week.  Continue ASA, Lipitor, beta-blocker, nitrates.  2. Chronic combined systolic and diastolic CHF (congestive heart failure):  Volume stable.  Continue current regimen.   3.  Ischemic Cardiomyopathy:   CABG scheduled for next week. Post op, will need to try to DC Cardizem and start ACE inhibitor.    4. Paroxysmal atrial fibrillation:  Maintaining NSR. He remains on Pradaxa for anticoagulation. He has a hx of CVA.  His Creatinine Clearance is 76.  Ok to hold Pradaxa 3 days prior to CABG.  Question if Maze will be considered at time of surgery.    5. Essential hypertension:  Controlled.  6. HYPERCHOLESTEROLEMIA:  Continue statin.    7. Carotid stenosis, bilateral:  L CEA pending.       Medication Adjustments: Current medicines are reviewed at length with the patient today.  Concerns regarding medicines are as outlined above.  The following changes have been made:   Discontinued Medications   No medications on file   Modified Medications   Modified Medication Previous Medication   DABIGATRAN (PRADAXA) 150 MG CAPS CAPSULE dabigatran (PRADAXA) 150 MG CAPS      Take 1 capsule (150 mg total) by mouth every 12 (twelve) hours.    Take 150 mg by mouth every 12 (twelve) hours.   New Prescriptions   No medications on file   Labs/ tests ordered today include:  No orders of the defined types were placed in this encounter.      Disposition:    FU me post  bypass.     Signed, Versie Starks, MHS 05/14/2015 3:51 PM    [facility] [place], [place], [place]  [address] Phone: [phone]; Fax: [phone]

## 2015-05-14 ENCOUNTER — Encounter: Payer: Self-pay | Admitting: Physician Assistant

## 2015-05-14 ENCOUNTER — Ambulatory Visit (INDEPENDENT_AMBULATORY_CARE_PROVIDER_SITE_OTHER): Payer: Medicare (Managed Care) | Admitting: Physician Assistant

## 2015-05-14 VITALS — BP 118/58 | HR 73 | Ht 60.0 in | Wt 210.4 lb

## 2015-05-14 DIAGNOSIS — I255 Ischemic cardiomyopathy: Secondary | ICD-10-CM

## 2015-05-14 DIAGNOSIS — I480 Paroxysmal atrial fibrillation: Secondary | ICD-10-CM

## 2015-05-14 DIAGNOSIS — I2510 Atherosclerotic heart disease of native coronary artery without angina pectoris: Secondary | ICD-10-CM | POA: Diagnosis not present

## 2015-05-14 DIAGNOSIS — I6523 Occlusion and stenosis of bilateral carotid arteries: Secondary | ICD-10-CM

## 2015-05-14 DIAGNOSIS — I10 Essential (primary) hypertension: Secondary | ICD-10-CM

## 2015-05-14 DIAGNOSIS — I5042 Chronic combined systolic (congestive) and diastolic (congestive) heart failure: Secondary | ICD-10-CM | POA: Diagnosis not present

## 2015-05-14 DIAGNOSIS — E785 Hyperlipidemia, unspecified: Secondary | ICD-10-CM

## 2015-05-14 MED ORDER — DABIGATRAN ETEXILATE MESYLATE 150 MG PO CAPS: 150.0000 mg | ORAL_CAPSULE | Freq: Two times a day (BID) | ORAL | Status: DC

## 2015-05-14 NOTE — Patient Instructions (Signed)
Medication Instructions:  Your physician recommends that you continue on your current medications as directed. Please refer to the Current Medication list given to you today.   Labwork: NONE  Testing/Procedures: NONE  Follow-Up: 06/15/15 @ 2 PM WITH SCOTT WEAVER, PAC   Any Other Special Instructions Will Be Listed Below (If Applicable).

## 2015-05-15 ENCOUNTER — Inpatient Hospital Stay (HOSPITAL_COMMUNITY): Payer: Medicare (Managed Care) | Admitting: Emergency Medicine

## 2015-05-15 ENCOUNTER — Inpatient Hospital Stay (HOSPITAL_COMMUNITY): Payer: Medicare (Managed Care) | Admitting: Certified Registered Nurse Anesthetist

## 2015-05-15 ENCOUNTER — Ambulatory Visit (HOSPITAL_COMMUNITY)
Admission: RE | Admit: 2015-05-15 | Discharge: 2015-05-15 | Disposition: A | Discharge status: Home / Self Care | Payer: Medicare (Managed Care) | Source: Ambulatory Visit | Attending: Surgery | Admitting: Surgery

## 2015-05-15 ENCOUNTER — Encounter (HOSPITAL_COMMUNITY)
Admission: RE | Admit: 2015-05-15 | Discharge: 2015-05-15 | Disposition: A | Discharge status: Home / Self Care | Payer: Medicare (Managed Care) | Source: Ambulatory Visit | Attending: Surgery | Admitting: Surgery

## 2015-05-15 ENCOUNTER — Encounter (HOSPITAL_COMMUNITY): Payer: Self-pay

## 2015-05-15 DIAGNOSIS — J449 Chronic obstructive pulmonary disease, unspecified: Secondary | ICD-10-CM | POA: Insufficient documentation

## 2015-05-15 DIAGNOSIS — K219 Gastro-esophageal reflux disease without esophagitis: Secondary | ICD-10-CM | POA: Insufficient documentation

## 2015-05-15 DIAGNOSIS — I6523 Occlusion and stenosis of bilateral carotid arteries: Secondary | ICD-10-CM | POA: Diagnosis not present

## 2015-05-15 DIAGNOSIS — I2510 Atherosclerotic heart disease of native coronary artery without angina pectoris: Secondary | ICD-10-CM | POA: Insufficient documentation

## 2015-05-15 DIAGNOSIS — Z01818 Encounter for other preprocedural examination: Secondary | ICD-10-CM | POA: Insufficient documentation

## 2015-05-15 DIAGNOSIS — I5032 Chronic diastolic (congestive) heart failure: Secondary | ICD-10-CM | POA: Diagnosis not present

## 2015-05-15 DIAGNOSIS — Z87891 Personal history of nicotine dependence: Secondary | ICD-10-CM | POA: Insufficient documentation

## 2015-05-15 DIAGNOSIS — I10 Essential (primary) hypertension: Secondary | ICD-10-CM | POA: Diagnosis not present

## 2015-05-15 DIAGNOSIS — Z79899 Other long term (current) drug therapy: Secondary | ICD-10-CM | POA: Diagnosis not present

## 2015-05-15 DIAGNOSIS — E119 Type 2 diabetes mellitus without complications: Secondary | ICD-10-CM | POA: Insufficient documentation

## 2015-05-15 DIAGNOSIS — Z794 Long term (current) use of insulin: Secondary | ICD-10-CM | POA: Diagnosis not present

## 2015-05-15 DIAGNOSIS — Z7982 Long term (current) use of aspirin: Secondary | ICD-10-CM | POA: Insufficient documentation

## 2015-05-15 DIAGNOSIS — Z7984 Long term (current) use of oral hypoglycemic drugs: Secondary | ICD-10-CM | POA: Diagnosis not present

## 2015-05-15 DIAGNOSIS — E785 Hyperlipidemia, unspecified: Secondary | ICD-10-CM | POA: Insufficient documentation

## 2015-05-15 DIAGNOSIS — Z8673 Personal history of transient ischemic attack (TIA), and cerebral infarction without residual deficits: Secondary | ICD-10-CM | POA: Diagnosis not present

## 2015-05-15 DIAGNOSIS — I252 Old myocardial infarction: Secondary | ICD-10-CM | POA: Insufficient documentation

## 2015-05-15 DIAGNOSIS — Z01812 Encounter for preprocedural laboratory examination: Secondary | ICD-10-CM | POA: Insufficient documentation

## 2015-05-15 DIAGNOSIS — I517 Cardiomegaly: Secondary | ICD-10-CM | POA: Insufficient documentation

## 2015-05-15 DIAGNOSIS — Z0183 Encounter for blood typing: Secondary | ICD-10-CM | POA: Insufficient documentation

## 2015-05-15 DIAGNOSIS — I480 Paroxysmal atrial fibrillation: Secondary | ICD-10-CM | POA: Insufficient documentation

## 2015-05-15 DIAGNOSIS — F79 Unspecified intellectual disabilities: Secondary | ICD-10-CM | POA: Insufficient documentation

## 2015-05-15 HISTORY — DX: Dependence on supplemental oxygen: Z99.81

## 2015-05-15 LAB — COMPREHENSIVE METABOLIC PANEL
ALBUMIN: 3.2 g/dL — AB (ref 3.5–5.0)
ALK PHOS: 58 U/L (ref 38–126)
ALT: 13 U/L — ABNORMAL LOW (ref 17–63)
AST: 24 U/L (ref 15–41)
Anion gap: 12 (ref 5–15)
BILIRUBIN TOTAL: 0.3 mg/dL (ref 0.3–1.2)
BUN: 27 mg/dL — AB (ref 6–20)
CALCIUM: 9.5 mg/dL (ref 8.9–10.3)
CO2: 33 mmol/L — AB (ref 22–32)
Chloride: 91 mmol/L — ABNORMAL LOW (ref 101–111)
Creatinine, Ser: 1.45 mg/dL — ABNORMAL HIGH (ref 0.61–1.24)
GFR calc Af Amer: 59 mL/min — ABNORMAL LOW (ref >60)
GFR calc non Af Amer: 51 mL/min — ABNORMAL LOW (ref >60)
GLUCOSE: 224 mg/dL — AB (ref 65–99)
Potassium: 3.5 mmol/L (ref 3.5–5.1)
SODIUM: 136 mmol/L (ref 135–145)
TOTAL PROTEIN: 6.4 g/dL — AB (ref 6.5–8.1)

## 2015-05-15 LAB — PULMONARY FUNCTION TEST
DL/VA % pred: 168 %
DL/VA: 6.19 ml/min/mmHg/L
DLCO COR: 21.54 ml/min/mmHg
DLCO UNC % PRED: 111 %
DLCO cor % pred: 122 %
DLCO unc: 19.47 ml/min/mmHg
FEF 25-75 PRE: 1.31 L/s
FEF 25-75 Post: 0.84 L/sec
FEF2575-%CHANGE-POST: -35 %
FEF2575-%PRED-PRE: 66 %
FEF2575-%Pred-Post: 42 %
FEV1-%Change-Post: -8 %
FEV1-%PRED-PRE: 69 %
FEV1-%Pred-Post: 62 %
FEV1-POST: 1.44 L
FEV1-Pre: 1.58 L
FEV1FVC-%Change-Post: 5 %
FEV1FVC-%Pred-Pre: 100 %
FEV6-%CHANGE-POST: -14 %
FEV6-%PRED-POST: 61 %
FEV6-%Pred-Pre: 72 %
FEV6-PRE: 2.06 L
FEV6-Post: 1.77 L
FEV6FVC-%PRED-PRE: 106 %
FEV6FVC-%Pred-Post: 106 %
FVC-%Change-Post: -14 %
FVC-%Pred-Post: 58 %
FVC-%Pred-Pre: 67 %
FVC-Post: 1.77 L
FVC-Pre: 2.06 L
POST FEV1/FVC RATIO: 81 %
POST FEV6/FVC RATIO: 100 %
Pre FEV1/FVC ratio: 77 %
Pre FEV6/FVC Ratio: 100 %
RV % pred: 142 %
RV: 2.37 L
TLC % PRED: 94 %
TLC: 4.5 L

## 2015-05-15 LAB — BLOOD GAS, ARTERIAL
Acid-Base Excess: 3.7 mmol/L — ABNORMAL HIGH (ref 0.0–2.0)
BICARBONATE: 28 meq/L — AB (ref 20.0–24.0)
Drawn by: 24231
FIO2: 0.21
O2 Saturation: 92.9 %
PCO2 ART: 43.9 mmHg (ref 35.0–45.0)
PH ART: 7.42 (ref 7.350–7.450)
PO2 ART: 68.6 mmHg — AB (ref 80.0–100.0)
Patient temperature: 98.6
TCO2: 29.3 mmol/L (ref 0–100)

## 2015-05-15 LAB — URINALYSIS, ROUTINE W REFLEX MICROSCOPIC
BILIRUBIN URINE: NEGATIVE
Glucose, UA: NEGATIVE mg/dL
KETONES UR: NEGATIVE mg/dL
LEUKOCYTES UA: NEGATIVE
Nitrite: NEGATIVE
PH: 5.5 (ref 5.0–8.0)
Protein, ur: NEGATIVE mg/dL
Specific Gravity, Urine: 1.008 (ref 1.005–1.030)
UROBILINOGEN UA: 0.2 mg/dL (ref 0.0–1.0)

## 2015-05-15 LAB — TYPE AND SCREEN
ABO/RH(D): B POS
ANTIBODY SCREEN: NEGATIVE

## 2015-05-15 LAB — PROTIME-INR
INR: 1.01 (ref 0.00–1.49)
Prothrombin Time: 13.5 seconds (ref 11.6–15.2)

## 2015-05-15 LAB — CBC
HEMATOCRIT: 36.7 % — AB (ref 39.0–52.0)
HEMOGLOBIN: 11.9 g/dL — AB (ref 13.0–17.0)
MCH: 26.9 pg (ref 26.0–34.0)
MCHC: 32.4 g/dL (ref 30.0–36.0)
MCV: 83 fL (ref 78.0–100.0)
Platelets: 276 10*3/uL (ref 150–400)
RBC: 4.42 MIL/uL (ref 4.22–5.81)
RDW: 14 % (ref 11.5–15.5)
WBC: 8.9 10*3/uL (ref 4.0–10.5)

## 2015-05-15 LAB — SURGICAL PCR SCREEN
MRSA, PCR: NEGATIVE
STAPHYLOCOCCUS AUREUS: POSITIVE — AB

## 2015-05-15 LAB — GLUCOSE, CAPILLARY: Glucose-Capillary: 249 mg/dL — ABNORMAL HIGH (ref 65–99)

## 2015-05-15 LAB — URINE MICROSCOPIC-ADD ON

## 2015-05-15 LAB — APTT: APTT: 30 s (ref 24–37)

## 2015-05-15 LAB — ABO/RH: ABO/RH(D): B POS

## 2015-05-15 MED ORDER — ALBUTEROL SULFATE (2.5 MG/3ML) 0.083% IN NEBU
2.5000 mg | INHALATION_SOLUTION | Freq: Once | RESPIRATORY_TRACT | Status: AC
  Administered 2015-05-15: 2.5 mg via RESPIRATORY_TRACT

## 2015-05-15 NOTE — Progress Notes (Signed)
   05/15/15 1504  OBSTRUCTIVE SLEEP APNEA  Have you ever been diagnosed with sleep apnea through a sleep study? No  Do you snore loudly (loud enough to be heard through closed doors)?  0  Do you often feel tired, fatigued, or sleepy during the daytime (such as falling asleep during driving or talking to someone)? 1  Has anyone observed you stop breathing during your sleep? 0  Do you have, or are you being treated for high blood pressure? 1  BMI more than 35 kg/m2? 1  Age > 50 (1-yes) 1  Neck circumference greater than:Male 16 inches or larger, Male 17inches or larger? 1  Male Gender (Yes=1) 1  Obstructive Sleep Apnea Score 6  Score 5 or greater  Results sent to PCP   This patient has screened at risk for sleep apnea using the STOP Bang tool used during a pre-surgical visit. A score of 4 or greater is at risk for sleep apnea.

## 2015-05-15 NOTE — Progress Notes (Addendum)
VASCULAR LAB PRELIMINARY  PRELIMINARY  PRELIMINARY  PRELIMINARY  Pre-op Cardiac Surgery  Carotid Findings:  Right ICA 40-59% stenosis,  Left ICA 80-99% stenosis.   Upper Extremity Right Left  Brachial Pressures 138 139  Radial Waveforms normal normal  Ulnar Waveforms Normal  normal  Palmar Arch (Allen's Test) WNL WNL   Findings:      Lower  Extremity Right Left  Dorsalis Pedis [place]  Posterior Tibial Waller [place]  Ankle/Brachial Indices Non Compressible Non compressible    Findings:  Non-compressible ABIs bilaterally. Left TBI appeared abnormal.   Sturdivant, Bonnye Fava, RVT 05/15/2015, 2:45 PM

## 2015-05-15 NOTE — Pre-Procedure Instructions (Signed)
[name]  05/15/2015     Your procedure is scheduled on : Tuesday May 19, 2015 at 7:30 AM.  Report to [facility] Admitting at 5:30 A.M.  Call this number if you have problems the morning of surgery: [phone]   Remember:  Do not eat food or drink liquids after midnight.  Take these medicines the morning of surgery with A SIP OF WATER : Diltiazem (Cardizem), Gabapentin (Neurontin), Hydrocodone if needed, Isosorbide (Imdur), Metoprolol (Toprol XL), Omeprazole (Prilosec), Paroxetine (Paxil)    Do NOT take any diabetic pills the morning of your surgery (NO Meformin/Glucophage)   Only take 48 units of Lantus the night before your surgery   Please follow your Physicians instructions regarding Pradaxa  How to Manage Your Diabetes Before Surgery   Why is it important to control my blood sugar before and after surgery?   Improving blood sugar levels before and after surgery helps healing and can limit problems.  A way of improving blood sugar control is eating a healthy diet by:  - Eating less sugar and carbohydrates  - Increasing activity/exercise  - Talk with your doctor about reaching your blood sugar goals  High blood sugars (greater than 180 mg/dL) can raise your risk of infections and slow down your recovery so you will need to focus on controlling your diabetes during the weeks before surgery.  Make sure that the doctor who takes care of your diabetes knows about your planned surgery including the date and location.  How do I manage my blood sugars before surgery?   Check your blood sugar at least 4 times a day, 2 days before surgery to make sure that they are not too high or low.   Check your blood sugar the morning of your surgery when you wake up and every 2 hours until you get to the Short-Stay unit.  If your blood sugar is less than 70 mg/dL, you will need to treat for low blood sugar by:  Treat a low blood sugar (less than 70 mg/dL) with 1/2  cup of clear juice (cranberry or apple), 4 glucose tablets, OR glucose gel.  Recheck blood sugar in 15 minutes after treatment (to make sure it is greater than 70 mg/dL).  If blood sugar is not greater than 70 mg/dL on re-check, call [phone] for further instructions.   Report your blood sugar to the Short-Stay nurse when you get to Short-Stay.  References:  [facility], 2007 "How to Manage your Diabetes Before and After Surgery".  What do I do about my diabetes medications?   Do not take oral diabetes medicines (pills) the morning of surgery.  THE NIGHT BEFORE SURGERY, take 48 units of Lantus Insulin.    Do not wear jewelry.  Do not wear lotions, powders, or cologne.    Men may shave face.  Do not bring valuables to the hospital.  [facility] is not responsible for any belongings or valuables.  Contacts, dentures or bridgework may not be worn into surgery.  Leave your suitcase in the car.  After surgery it may be brought to your room.  For patients admitted to the hospital, discharge time will be determined by your treatment team.  Patients discharged the day of surgery will not be allowed to drive home.   Name and phone number of your driver:    Special instructions:  Shower using CHG soap the night before and the morning of your surgery  Please read over  the following fact sheets that you were given. Pain Booklet, Coughing and Deep Breathing, Blood Transfusion Information, Open Heart Packet, MRSA Information and Surgical Site Infection Prevention

## 2015-05-15 NOTE — Progress Notes (Signed)
Patient arrived for 1400 PAT appointment at 1455 via wheelchair. Patient was pleasant, and alert and oriented X 4, however Nurse did notice some difficulty with patient recalling his past medical and surgical history. Patient also informed Nurse that he difficulty reading and writing. Nurse questioned patient about his arrival to PAT and he stated that he was dropped off by [facility]. Nurse also inquired about patients living arrangements, and he informed Nurse that he lives with his sister's boyfriend, however his sister is his caregiver.   Patient denied having any acute cardiac or pulmonary issues  Nurse asked patient about blood glucose and patient stated that he checks his sugars at home, and the highest his blood sugar has been was 400 and the lowest was 98.   Nurse asked patient about Pradaxa and patient stated he stopped taking it 3 days ago.   While reviewing DOS instruction sheet with patient, patient appeared confused. Nurse asked patient who helped him with his medications and he informed Nurse that his medications are prepackaged and that he did not have any individual bottles. Nurse then attempted to call patients sister, however she did not answer the number listed, so Nurse left a voicemail instructing her to return call at her earliest convenience. Patient then called his sister from his cell phone and Nurse spoke to Lazear (patients sister). DOS medications and NPO after MN information was reviewed over public airways with patients sister, and Nurse also told her that patient was to only take 48 units of Lantus insulin the night before surgery instead of the 60 units that he normally takes. Patients sister stated "you're going to give him that in writing?.....ok, well we'll figure it out then." Afterwards call ended.  Before patient left PAT Nurse asked patient how many units of Lantus insulin he was to take the night before surgery, and patient stated "48" (with a smile).

## 2015-05-16 LAB — HEMOGLOBIN A1C
Hgb A1c MFr Bld: 10.7 % — ABNORMAL HIGH (ref 4.8–5.6)
MEAN PLASMA GLUCOSE: 260 mg/dL

## 2015-05-18 MED ORDER — DEXMEDETOMIDINE HCL IN NACL 400 MCG/100ML IV SOLN
0.1000 ug/kg/h | INTRAVENOUS | Status: DC
  Filled 2015-05-18: qty 100

## 2015-05-18 MED ORDER — DEXTROSE 5 % IV SOLN
750.0000 mg | INTRAVENOUS | Status: DC
  Filled 2015-05-18: qty 750

## 2015-05-18 MED ORDER — VANCOMYCIN HCL 10 G IV SOLR
1500.0000 mg | INTRAVENOUS | Status: DC
  Filled 2015-05-18: qty 1500

## 2015-05-18 MED ORDER — SODIUM CHLORIDE 0.9 % IV SOLN
INTRAVENOUS | Status: DC
  Filled 2015-05-18: qty 2.5

## 2015-05-18 MED ORDER — EPINEPHRINE HCL 1 MG/ML IJ SOLN
0.0000 ug/min | INTRAVENOUS | Status: DC
  Filled 2015-05-18: qty 4

## 2015-05-18 MED ORDER — PHENYLEPHRINE HCL 10 MG/ML IJ SOLN
30.0000 ug/min | INTRAVENOUS | Status: DC
  Filled 2015-05-18: qty 2

## 2015-05-18 MED ORDER — NITROGLYCERIN IN D5W 200-5 MCG/ML-% IV SOLN
2.0000 ug/min | INTRAVENOUS | Status: DC
  Filled 2015-05-18: qty 250

## 2015-05-18 MED ORDER — CHLORHEXIDINE GLUCONATE 0.12 % MT SOLN
15.0000 mL | Freq: Once | OROMUCOSAL | Status: DC
  Filled 2015-05-18: qty 15

## 2015-05-18 MED ORDER — SODIUM CHLORIDE 0.9 % IV SOLN
INTRAVENOUS | Status: DC
  Filled 2015-05-18: qty 30

## 2015-05-18 MED ORDER — POTASSIUM CHLORIDE 2 MEQ/ML IV SOLN
80.0000 meq | INTRAVENOUS | Status: DC
  Filled 2015-05-18: qty 40

## 2015-05-18 MED ORDER — PAPAVERINE HCL 30 MG/ML IJ SOLN
INTRAMUSCULAR | Status: DC
  Filled 2015-05-18: qty 2.5

## 2015-05-18 MED ORDER — METOPROLOL TARTRATE 12.5 MG HALF TABLET: 12.5000 mg | ORAL_TABLET | Freq: Once | ORAL | Status: DC

## 2015-05-18 MED ORDER — DEXTROSE 5 % IV SOLN
1.5000 g | INTRAVENOUS | Status: DC
  Filled 2015-05-18: qty 1.5

## 2015-05-18 MED ORDER — SODIUM CHLORIDE 0.9 % IV SOLN
INTRAVENOUS | Status: DC
  Filled 2015-05-18: qty 40

## 2015-05-18 MED ORDER — MAGNESIUM SULFATE 50 % IJ SOLN
40.0000 meq | INTRAMUSCULAR | Status: DC
  Filled 2015-05-18: qty 10

## 2015-05-18 MED ORDER — DOPAMINE-DEXTROSE 3.2-5 MG/ML-% IV SOLN
0.0000 ug/kg/min | INTRAVENOUS | Status: DC
Start: 2015-05-19 — End: 2015-05-19
  Filled 2015-05-18: qty 250

## 2015-05-18 NOTE — Progress Notes (Signed)
Anesthesia Chart Review: Patient is a 59 year old male scheduled for CABG (Dr. Cyndia Bent) combined with left CEA (Dr. Scot Dock) on 05/19/15.  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 BMI is consistent with morbid obesity. OSA screening score was 6. PCP is listed as Dr. Barney Drain. Cardiologist is Dr. Mertie Moores.  Per PAT RN, patient came to his PAT by himself, but was A&O X 4 and able to tell her about his planned procedure. She did however, have to speak with his sister [name] when it came to verifying that he understood pre-operative medication instructions, particularly as it pertained to his insulin. She believes that [name] is planning to come with him on the day of surgery.   Meds include ASA, Lipitor, Klonopin, Pradaxa, diltiazem, Fergon, Lasix, Neuronin, Lantus, Imdur, Mag-ox, metformin, Toprol XL, Nitro, Prilosec, Paxil. Per [facility] notes, he is to hold Pradaxa for three days prior to surgery (at PAT, he reported stopping three days ago).  04/28/15 EKG: NSR, right BBB, LPFB, bifascicular block, possible inferior infarct (age undetermined).  04/30/15 Cardiac cath (done following high risk stress test with large reversible inferolateral perfusion defect):  1. Ost LAD to Mid LAD lesion, 95% stenosed. 2. Ost 1st Diag to 1st Diag lesion, 100% stenosed. 3. Prox RCA to Dist RCA lesion, 100% stenosed. 4. Ost RCA lesion, 80% stenosed. 5. RPDA lesion, 85% stenosed. 6. Dist RCA lesion, 100% stenosed. 7. Ost 3rd Mrg  lesion, 90% stenosed. 8. Prox Cx to Mid Cx lesion, 70% stenosed. 9. Mid Cx lesion, 65% stenosed. 10. Ost Ramus to Ramus( or OM lesion, 65% stenosed. 11. Ost 2nd Diag to 2nd Diag lesion, 85% stenosed. 12. Post Atrio lesion, 100% stenosed. 70. Dist LAD lesion, 40% stenosed.  Severe, diffuse, and heavily calcified 3 vessel coronary disease.  Total occlusion of the mid RCA and distal RCA. The right coronary fills by collaterals from the LAD and circumflex. High-grade segmental proximal to mid LAD (80%), total occlusion of diagonal 1 (small branch), severe diffuse proximal disease in diagonal 2 (very small vessel), moderate to moderately severe first obtuse marginal ([place]) stenosis, 90% ostial third obtuse marginal, diffuse 50-70% proximal and mid circumflex.  Significant left ventricular dysfunction with inferior wall severe hypokinesis. Depressed overall LV function with EF 35%. RECOMMENDATIONS:  Consider surgical revascularization, prior to carotid  endarterectomy or simultaneously.  Further management per primary cardiologist,  Dr. Acie Fredrickson.  Continue current medical regimen.  12/18/14 Echo: Impressions: The patient was in rapid atrial fibrillation. Normal LV size with mild LV hypertrophy. EF 55%. Normal RV size and systolicfunction. No significant valvular abnormalities.  04/08/15 Carotid duplex: > 80% left carotid stenosis. Peak systolic velocity in the proximal left internal carotid artery is 440 cm/s with an end-diastolic velocity of 623 cm/s. There is a 60-79% right carotid stenosis. The right vertebral artery is patent with antegrade flow in the left vertebral artery is not visualized.  Preoperative CXR, PFTs, and labs noted. A1C 10.7 (up from 7.9 on 10/27/14). A1C results routed to both surgeons and their [place] [facility] [place]. If  they feel labs are acceptable then I would anticipate that he can proceed but will need close follow-up of his DM control in the post-operative  period.    George Hugh [facility] Stay [facility]/Anesthesiology Phone [phone] 05/18/2015 10:57 AM

## 2015-05-18 NOTE — H&P (Signed)
[address]       [place] [address]             [phone]      Cardiothoracic Surgery History and Physical   PCP is [name], MD Referring Provider is Belva Crome, MD  Chief Complaint  Patient presents with  . Coronary Artery Disease    eval fo CABG...cathed 04/30/15    HPI:  The patient is a 59 year old gentleman with a history of DM with complications, hypertension, hyperlipidemia, PAF, previous smoking, prior stroke and cerebrovascular disease and mental retardation who has been followed by Dr. [name] for carotid artery disease. He has had progression of his left carotid stenosis to greater than 80% and left CEA was recommended. He had been admitted in March 2016 with CHF and a NSTEMI and has occasional chest pain and left shoulder pain. He had an abnormal nuclear stress test and catheterization shows severe multi-vessel coronary disease with diffusely diseased and calcified coronary arteries.  Past Medical History  Diagnosis Date  . Hypertension   . Diabetes mellitus   . Mental retardation     Sister helps to take care of him and takes him to appts  . Tobacco user     Smokes 1ppd for multiple years. Quit after hosp 09/2010.  Marland Kitchen Anemia     History of Iron Def Anemia  . Anxiety   . Hyperlipidemia   . PAF (paroxysmal atrial fibrillation) 06/2009    CHADS score 2 (HTN, DM), was not on coumadin, but now on Pradaxa for Afib  . Lung nodule   . Chronic low back pain   . CVA (cerebral infarction) 09/2010    Bilateral with Left > Right  . Abdominal hernia     Chronic, not a good surgical candidate  . Carotid artery occlusion   . Obesity   . COPD (chronic obstructive pulmonary disease)   . Abscess, abdomen 12/31/2010    Referred to Wound Care in 01/2011 because of multiple abd abscess with VERY large ventral hernia (please look at image of  CT abd/pelvis 09/2010). Because of hernia I was hesitant to I&D.   Marland Kitchen GERD (gastroesophageal reflux disease)   . Pneumonia     hx of  . Chronic diastolic CHF (congestive heart failure)     takes Lasix  . Itching     all over body; pt scratches and has sores on bilateral arms and abdomen  . Hx of echocardiogram     Echo 5/16: Mild LVH, EF 55%, indeterm. diast function, WMA could not be ruled out, MAC, trivial MR, mild LAE, normal RVF  . Myocardial infarction 2016 ?    Heart attack ( Per pt. )  . History of nuclear stress test     Myoview 9/16: Inferior, apical and inf-lateral ischemia; not gated; High Risk    Past Surgical History  Procedure Laterality Date  . Carotid arteriogram  10/2010    30% right ICA stenosis, 40% left ICA stenosis   . Transesophageal echocardiogram  09/2010    No ASD or PFO. EF 60-65%. Normal systolic function. No evidence of thrombus.   . Transthoracic echocardiogram  09/2010     The cavity size was normal. Systolic function was vigorous. EF 65-70%. Normal wall funciton.   . Debridment of decubitus ulcer Right 02/13/2013  . I&d extremity Right 02/13/2013    Procedure: IRRIGATION AND DEBRIDEMENT FOOT ULCER; Surgeon: Johnny Bridge, MD; Location:  [facility]; Service: Orthopedics; Laterality: Right; PULSE LAVAGE  . Cataract extraction, bilateral    . Arm surgery Left     as a child  . Amputation Right 03/29/2013    Procedure: AMPUTATION RAY; Surgeon: Newt Minion, MD; Location: [facility]; Service: Orthopedics; Laterality: Right; Right Foot 3rd and Possible 4th Ray Amputation  . Multiple tooth extractions    . Amputation Right 04/23/2013    Procedure: AMPUTATION RIGHT MID-FOOT; Surgeon: Newt Minion, MD; Location: [facility]; Service: Orthopedics; Laterality: Right;  . Esophagogastroduodenoscopy (egd) with propofol N/A 06/10/2014    Procedure:  ESOPHAGOGASTRODUODENOSCOPY (EGD) WITH PROPOFOL; Surgeon: Jerene Bears, MD; Location: [facility]; Service: Gastroenterology; Laterality: N/A;  . Colonoscopy with propofol N/A 06/10/2014    Procedure: COLONOSCOPY WITH PROPOFOL; Surgeon: Jerene Bears, MD; Location: [facility]; Service: Gastroenterology; Laterality: N/A;  . Givens capsule study N/A 07/09/2014    Procedure: GIVENS CAPSULE STUDY; Surgeon: Jerene Bears, MD; Location: [facility]; Service: Gastroenterology; Laterality: N/A;  . Cardiac catheterization N/A 04/30/2015    Procedure: Left Heart Cath and Coronary Angiography; Surgeon: Belva Crome, MD; Location: [facility]; Service: Cardiovascular; Laterality: N/A;    Family History  Problem Relation Age of Onset  . Heart disease Mother   . Hypertension Sister   . Heart disease Sister   . Heart disease Brother   . Heart disease Father   . Peripheral vascular disease Father   . Heart disease Maternal Grandmother   . Heart attack Maternal Grandmother   . Colon cancer Neg Hx   . Breast cancer Maternal Grandmother   . Stomach cancer Maternal Uncle   . Stroke Neg Hx     Social History Social History  Substance Use Topics  . Smoking status: Former Smoker -- 1.00 packs/day for 42 years    Types: Cigarettes    Quit date: 11/10/2010  . Smokeless tobacco: Former Systems developer    Quit date: 10/02/2010  . Alcohol Use: 0.0 oz/week    0 Standard drinks or equivalent per week     Comment: rare    Current Outpatient Prescriptions  Medication Sig Dispense Refill  . aspirin EC 81 MG EC tablet Take 1 tablet (81 mg total) by mouth daily. 90 tablet 3  . atorvastatin (LIPITOR) 80 MG tablet Take 1 tablet (80 mg total) by mouth daily at 6 PM. 45 tablet 0  . Cholecalciferol (VITAMIN D3) 1000 UNITS CAPS Take 1,000 Units by mouth daily.    . clonazePAM  (KLONOPIN) 0.5 MG tablet Take 1 tablet (0.5 mg total) by mouth at bedtime. 30 tablet 0  . dabigatran (PRADAXA) 150 MG CAPS Take 150 mg by mouth every 12 (twelve) hours.    Marland Kitchen diltiazem (CARDIZEM CD) 180 MG 24 hr capsule Take 1 capsule (180 mg total) by mouth daily. 30 capsule 0  . furosemide (LASIX) 80 MG tablet Take 1 tablet (80 mg total) by mouth 2 (two) times daily. 60 tablet 0  . gabapentin (NEURONTIN) 300 MG capsule Take 1 capsule (300 mg total) by mouth 2 (two) times daily. 60 capsule 0  . HYDROcodone-acetaminophen (NORCO/VICODIN) 5-325 MG per tablet 1 by mouth once daily as needed for pain control 30 tablet 0  . hydrocortisone 2.5 % ointment Apply topically 3 (three) times daily. To leg around wound. Do not apply to broken skin. 30 g 0  . insulin glargine (LANTUS) 100 UNIT/ML injection Inject 60 Units into the skin at bedtime.     . isosorbide mononitrate (IMDUR)  30 MG 24 hr tablet Take 30 mg by mouth every morning.    . magnesium oxide (MAG-OX) 400 MG tablet Take 400 mg by mouth daily.    . metFORMIN (GLUCOPHAGE) 1000 MG tablet Take 1,000 mg by mouth 2 (two) times daily with a meal.    . metoprolol succinate (TOPROL-XL) 50 MG 24 hr tablet Take 1 tablet (50 mg total) by mouth as directed. Take 1/2 tab daily per PACE 12/22/14; Take with or immediately following a meal.    . nitroGLYCERIN (NITROSTAT) 0.4 MG SL tablet Place 0.4 mg under the tongue every 5 (five) minutes as needed for chest pain.     Marland Kitchen omeprazole (PRILOSEC) 20 MG capsule Take 20 mg by mouth daily.    Marland Kitchen PARoxetine (PAXIL) 40 MG tablet Take 40 mg by mouth every morning.    . saccharomyces boulardii (FLORASTOR) 250 MG capsule Take 1 capsule (250 mg total) by mouth 2 (two) times daily. 60 capsule 2  . ferrous gluconate (FERGON) 324 MG tablet Take 1 tablet (324 mg total) by mouth 2 (two) times daily with a meal. 60 tablet 0   No current  facility-administered medications for this visit.    Allergies  Allergen Reactions  . Penicillins Hives and Nausea And Vomiting    Review of Systems  Constitutional: Positive for fatigue. Negative for activity change and appetite change.  HENT: Negative.   Dentures  Eyes:   Glasses  Respiratory: Negative for shortness of breath.   Uses home oxygen at night 2L  Cardiovascular: Positive for chest pain. Negative for palpitations and leg swelling.  Gastrointestinal: Negative.  Endocrine: Negative.  Genitourinary: Negative.  Musculoskeletal: Negative.  Skin: Negative.  Allergic/Immunologic: Negative.  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  Hematological: Bruises/bleeds easily.  Psychiatric/Behavioral: The patient is nervous/anxious.    BP 120/75 mmHg  Pulse 82  Resp 16  Ht 5' (1.524 m)  Wt 211 lb (95.709 kg)  BMI 41.21 kg/m2  SpO2 93% Physical Exam  Constitutional: He is oriented to person, place, and time.  Chronically ill-appearing in no distress  HENT:  Head: Normocephalic and atraumatic.  Mouth/Throat: Oropharynx is clear and moist.  Eyes: EOM are normal. Pupils are equal, round, and reactive to light.  Neck: Normal range of motion. Neck supple. No JVD present. No thyromegaly present.  Cardiovascular: Normal rate, regular rhythm, normal heart sounds and intact distal pulses. Exam reveals no gallop and no friction rub.  No murmur heard. Pulmonary/Chest: Effort normal and breath sounds normal. No respiratory distress.  Abdominal: Soft. Bowel sounds are normal. There is no tenderness.  Very large ventral abdominal hernia that looks like a panniculus.  Musculoskeletal: Normal range of motion. He exhibits no edema.  Right transmetatarsal amputation  Lymphadenopathy:   He has no cervical adenopathy.  Neurological: He is alert and oriented to person, place, and time. He has normal  strength. No cranial nerve deficit or sensory deficit.  Skin: Skin is warm and dry.  Abrasions over shins.  Psychiatric: He has a normal mood and affect. His behavior is normal.  Has good grasp of events and follows directions well. Has a good memory for what I have told him.     Diagnostic Tests:  Conclusion    1. Ost LAD to Mid LAD lesion, 95% stenosed. 2. Ost 1st Diag to 1st Diag lesion, 100% stenosed. 3. Prox RCA to Dist RCA lesion, 100% stenosed. 4. Ost RCA lesion, 80% stenosed. 5. RPDA lesion,  85% stenosed. 6. Dist RCA lesion, 100% stenosed. 7. Ost 3rd Mrg lesion, 90% stenosed. 8. Prox Cx to Mid Cx lesion, 70% stenosed. 9. Mid Cx lesion, 65% stenosed. 10. Ost Ramus to Ramus( or OM lesion, 65% stenosed. 11. Ost 2nd Diag to 2nd Diag lesion, 85% stenosed. 12. Post Atrio lesion, 100% stenosed. 40. Dist LAD lesion, 40% stenosed.   Severe, diffuse, and heavily calcified 3 vessel coronary disease.  Total occlusion of the mid RCA and distal RCA. The right coronary fills by collaterals from the LAD and circumflex. High-grade segmental proximal to mid LAD (80%), total occlusion of diagonal 1 (small branch), severe diffuse proximal disease in diagonal 2 (very small vessel), moderate to moderately severe first obtuse marginal (Ramus) stenosis, 90% ostial third obtuse marginal, diffuse 50-70% proximal and mid circumflex.  Significant left ventricular dysfunction with inferior wall severe hypokinesis. Depressed overall LV function with EF 35%.   RECOMMENDATIONS:   Consider surgical revascularization, prior to carotid endarterectomy or simultaneously.  Further management per primary cardiologist, Dr. Acie Fredrickson.  Continue current medical regimen.     Coronary Findings    Dominance: Right   Left Anterior Descending   . Ost LAD to Mid LAD lesion, 95% stenosed. The lesion is type C calcified tubular eccentric ulcerative .   Marland Kitchen Dist LAD lesion, 40% stenosed.  diffuse eccentric .   Marland Kitchen [employer], production   . Ost 1st Diag to 1st Diag lesion, 100% stenosed. The lesion is type C .   . Second Diagonal Branch   The vessel is small in size.   Colon Flattery 2nd Diag to 2nd Diag lesion, 85% stenosed. The lesion is type C .     Ramus Intermedius   . Ost Ramus to Ramus lesion, 65% stenosed. The lesion is type C .     Left Circumflex  The vessel is small .   Marland Kitchen Prox Cx to Mid Cx lesion, 70% stenosed. The lesion is type C calcified eccentric .   Marland Kitchen Mid Cx lesion, 65% stenosed. The lesion is type C calcified eccentric .   Marland Kitchen First Obtuse Marginal Branch   The vessel is small in size.   Marland Kitchen [employer]   . Ost 2nd Mrg lesion, 90% stenosed. calcified eccentric .   Marland Kitchen Third Obtuse Marginal Branch   The vessel is small in size.     Right Coronary Artery   . Ost RCA lesion, 80% stenosed. The lesion is type C tubular eccentric .   Marland Kitchen Prox RCA to Dist RCA lesion, 100% stenosed. The lesion is type C .   Marland Kitchen Dist RCA lesion, 100% stenosed.   . Acute Marginal Branch   The vessel is small in size.   . Right Posterior Descending Artery   RPDA filled by collaterals from Dist LAD.   Marland Kitchen RPDA lesion, 85% stenosed. calcified eccentric .   Marland Kitchen Right Posterior Atrioventricular Branch   . Post Atrio lesion, 100% stenosed.   . Third Right Posterolateral   3rd RPLB filled by collaterals from Mid Cx.      Wall Motion                 Left Heart    Left Ventricle The left ventricle is enlarged. The left ventricular ejection fraction is 35-45% by visual estimate.    Coronary Diagrams    Diagnostic Diagram            Implants    Name ID Temporary Type Supply  No information to display    Hemo Data       Most Recent Value   AO Systolic Pressure  856 mmHg   AO Diastolic Pressure  54 mmHg   AO Mean  73  mmHg   LV Systolic Pressure  314 mmHg   LV Diastolic Pressure  6 mmHg   LV EDP  15 mmHg   Arterial Occlusion Pressure Extended Systolic Pressure  970 mmHg   Arterial Occlusion Pressure Extended Diastolic Pressure  58 mmHg   Arterial Occlusion Pressure Extended Mean Pressure  81 mmHg   Left Ventricular Apex Extended Systolic Pressure  263 mmHg   Left Ventricular Apex Extended Diastolic Pressure  4 mmHg   Left Ventricular Apex Extended EDP Pressure  13 mmHg    Order-Level Documents:    There are no order-level documents.    Encounter-Level Documents - 04/28/15:      Scan on 05/02/2015 11:10 AM by Provider Default, MDScan on 05/02/2015 11:10 AM by Provider Default, MD     Scan on 05/02/2015 11:04 AM by Provider Default, MDScan on 05/02/2015 11:04 AM by Provider Default, MD     Scan on 04/30/2015 2:10 PM by Provider Default, MDScan on 04/30/2015 2:10 PM by Provider Default, MD     Electronic signature on 04/30/2015 8:41 AM    Signed    Electronically signed by Belva Crome, MD on 04/30/15 at 1438 EDT           *[facility]*            [address], [place] [address]              [phone]  ------------------------------------------------------------------- Transthoracic Echocardiography  Patient:  Clemente, Dewey MR #:    [id number] Study Date: 12/18/2014 Gender:   M Age:    41 Height:   152.4 cm Weight:   104.3 kg BSA:    2.17 m^2 Pt. Status: Room:  Faylene Million, Scott T Joanna Puff T ATTENDING  Loralie Champagne, M.D. SONOGRAPHER Cindy Hazy, RDCS PERFORMING  [facility], Outpatient  cc:  ------------------------------------------------------------------- LV EF: 55%  ------------------------------------------------------------------- Indications:   I21.4  NSTEMI.  ------------------------------------------------------------------- History:  PMH: Acquired from the patient and from the patient&'s chart. PMH: NSTEMI. CHF. Atrial Fibrillation. CVA. COPD. Chest Pain. Anemia. Edema. Mental Retardation. Risk factors: Hypertension. Obese.  ------------------------------------------------------------------- Study Conclusions  - Left ventricle: The cavity size was normal. Wall thickness was increased in a pattern of mild LVH. Indeterminant diastolic function (atrial fibrillation). The estimated ejection fraction was 55%. Although no diagnostic regional wall motion abnormality was identified, this possibility cannot be completely excluded on the basis of this study. - Aortic valve: There was no stenosis. - Mitral valve: Mildly calcified annulus. There was trivial regurgitation. - Left atrium: The atrium was mildly dilated. - Right ventricle: The cavity size was normal. Systolic function was normal. - Pulmonary arteries: No complete TR doppler jet so unable to estimate PA systolic pressure. - Inferior vena cava: The vessel was normal in size. The respirophasic diameter changes were in the normal range (>= 50%), consistent with normal central venous pressure.  Impressions:  - The patient was in rapid atrial fibrillation. Normal LV size with mild LV hypertrophy. EF 55%. Normal RV size and systolic function. No significant valvular abnormalities.  Transthoracic echocardiography. M-mode, complete 2D, spectral Doppler, and color Doppler. Birthdate: Patient birthdate: Mar 03, 1956. Age: Patient  is 59 yr old. Sex: Gender: male. BMI: 44.9 kg/m^2. Patient status: Outpatient. Study date: Study date: 12/18/2014. Study time: 02:06 PM. Location:  [facility]  -------------------------------------------------------------------  ------------------------------------------------------------------- Left  ventricle: The cavity size was normal. Wall thickness was increased in a pattern of mild LVH. Indeterminant diastolic function (atrial fibrillation). The estimated ejection fraction was 55%. Although no diagnostic regional wall motion abnormality was identified, this possibility cannot be completely excluded on the basis of this study.  ------------------------------------------------------------------- Aortic valve:  Trileaflet; mildly calcified leaflets. Doppler: There was no stenosis.  There was no regurgitation.  ------------------------------------------------------------------- Aorta: Aortic root: The aortic root was normal in size. Ascending aorta: The ascending aorta was normal in size.  ------------------------------------------------------------------- Mitral valve:  Mildly calcified annulus. Doppler:  There was no evidence for stenosis.  There was trivial regurgitation.  Peak gradient (D): 10 mm Hg.  ------------------------------------------------------------------- Left atrium: The atrium was mildly dilated.  ------------------------------------------------------------------- Right ventricle: The cavity size was normal. Systolic function was normal.  ------------------------------------------------------------------- Pulmonic valve:  Structurally normal valve.  Cusp separation was normal. Doppler: Transvalvular velocity was within the normal range. There was no regurgitation.  ------------------------------------------------------------------- Tricuspid valve:  Doppler: There was no significant regurgitation.  ------------------------------------------------------------------- Pulmonary artery:  No complete TR doppler jet so unable to estimate PA systolic pressure.  ------------------------------------------------------------------- Right atrium: The atrium was normal in  size.  ------------------------------------------------------------------- Pericardium: There was no pericardial effusion.  ------------------------------------------------------------------- Systemic veins: Inferior vena cava: The vessel was normal in size. The respirophasic diameter changes were in the normal range (>= 50%), consistent with normal central venous pressure.  ------------------------------------------------------------------- Measurements  Left ventricle              Value    Reference LV ID, ED, PLAX chordal         47  mm   43 - 52 LV ID, ES, PLAX chordal         35  mm   23 - 38 LV fx shortening, PLAX chordal (L)    26  %   >=29 LV PW thickness, ED           11  mm   --------- IVS/LV PW ratio, ED           1.09     <=1.3 Stroke volume, 2D            45  ml   --------- Stroke volume/bsa, 2D          21  ml/m^2 --------- LV e&', lateral              14.6 cm/s  --------- LV E/e&', lateral             10.82    --------- LV e&', medial              7.94 cm/s  --------- LV E/e&', medial             19.9     --------- LV e&', average              11.27 cm/s  --------- LV E/e&', average             14.02    ---------  Ventricular septum            Value    Reference IVS thickness, ED            12  mm   ---------  LVOT  Value    Reference LVOT ID, S                19  mm   --------- LVOT area                2.84 cm^2  --------- LVOT mean velocity, S          80.5 cm/s  --------- LVOT VTI, S               15.7 cm   ---------  Aorta                  Value    Reference Aortic root ID, ED             33  mm   ---------  Left atrium               Value    Reference LA ID, A-P, ES              37  mm   --------- LA ID/bsa, A-P              1.71 cm/m^2 <=2.2 LA volume, S               44.2 ml   --------- LA volume/bsa, S             20.4 ml/m^2 --------- LA volume, ES, 1-p A4C          41.1 ml   --------- LA volume/bsa, ES, 1-p A4C        19  ml/m^2 --------- LA volume, ES, 1-p A2C          46.5 ml   --------- LA volume/bsa, ES, 1-p A2C        21.5 ml/m^2 ---------  Mitral valve               Value    Reference Mitral E-wave peak velocity       158  cm/s  --------- Mitral A-wave peak velocity       110  cm/s  --------- Mitral deceleration time         173  ms   150 - 230 Mitral peak gradient, D         10  mm Hg --------- Mitral E/A ratio, peak          1.44     ---------  Right ventricle             Value    Reference RV s&', lateral, S            14.3 cm/s  ---------  Legend: (L) and (H) mark values outside specified reference range.  ------------------------------------------------------------------- Prepared and Electronically Authenticated by  Loralie Champagne, M.D. 2016-05-12T21:49:17       Impression:  He has severe multi-vessel coronary artery disease with a high risk nuclear stress showing a large mostly reversible inferior, apical and inferolateral defect. I agree that combined left CEA and CABG is indicated for prevention of stroke and MI and to improve his quality of life. I discussed the operative procedure with the patient including alternatives, benefits and risks; including but not limited to bleeding, blood transfusion, infection, stroke, myocardial infarction, graft failure, heart block  requiring a permanent pacemaker, organ dysfunction, and death. [name] understands and agrees to proceed.   Plan:  We will schedule left CEA and CABG for 05/19/2015.   [name], MD [facility] [phone]

## 2015-05-19 ENCOUNTER — Inpatient Hospital Stay (HOSPITAL_COMMUNITY)
Admission: RE | Admit: 2015-05-19 | Discharge: 2015-05-19 | DRG: 638 | Disposition: A | Discharge status: Home / Self Care | Payer: Medicare (Managed Care) | Source: Ambulatory Visit | Attending: Emergency Medicine | Admitting: Emergency Medicine

## 2015-05-19 ENCOUNTER — Encounter (HOSPITAL_COMMUNITY)
Admission: RE | Disposition: A | Discharge status: Home / Self Care | Payer: Self-pay | Source: Ambulatory Visit | Attending: Surgery

## 2015-05-19 ENCOUNTER — Inpatient Hospital Stay (HOSPITAL_COMMUNITY): Payer: Medicare (Managed Care)

## 2015-05-19 ENCOUNTER — Encounter (HOSPITAL_COMMUNITY): Payer: Self-pay | Admitting: Certified Registered Nurse Anesthetist

## 2015-05-19 DIAGNOSIS — Z5309 Procedure and treatment not carried out because of other contraindication: Secondary | ICD-10-CM | POA: Diagnosis present

## 2015-05-19 DIAGNOSIS — Z9981 Dependence on supplemental oxygen: Secondary | ICD-10-CM

## 2015-05-19 DIAGNOSIS — E1165 Type 2 diabetes mellitus with hyperglycemia: Principal | ICD-10-CM | POA: Diagnosis present

## 2015-05-19 DIAGNOSIS — I739 Peripheral vascular disease, unspecified: Secondary | ICD-10-CM | POA: Diagnosis present

## 2015-05-19 DIAGNOSIS — I5032 Chronic diastolic (congestive) heart failure: Secondary | ICD-10-CM | POA: Diagnosis present

## 2015-05-19 DIAGNOSIS — Z87891 Personal history of nicotine dependence: Secondary | ICD-10-CM

## 2015-05-19 DIAGNOSIS — K219 Gastro-esophageal reflux disease without esophagitis: Secondary | ICD-10-CM | POA: Diagnosis present

## 2015-05-19 DIAGNOSIS — R739 Hyperglycemia, unspecified: Secondary | ICD-10-CM

## 2015-05-19 DIAGNOSIS — Z9111 Patient's noncompliance with dietary regimen: Secondary | ICD-10-CM | POA: Diagnosis not present

## 2015-05-19 DIAGNOSIS — I2510 Atherosclerotic heart disease of native coronary artery without angina pectoris: Secondary | ICD-10-CM

## 2015-05-19 LAB — CBC WITH DIFFERENTIAL/PLATELET
BASOS PCT: 0 %
Basophils Absolute: 0 10*3/uL (ref 0.0–0.1)
EOS ABS: 0.4 10*3/uL (ref 0.0–0.7)
Eosinophils Relative: 5 %
HCT: 36.9 % — ABNORMAL LOW (ref 39.0–52.0)
HEMOGLOBIN: 12.2 g/dL — AB (ref 13.0–17.0)
Lymphocytes Relative: 18 %
Lymphs Abs: 1.6 10*3/uL (ref 0.7–4.0)
MCH: 26.9 pg (ref 26.0–34.0)
MCHC: 33.1 g/dL (ref 30.0–36.0)
MCV: 81.5 fL (ref 78.0–100.0)
MONOS PCT: 7 %
Monocytes Absolute: 0.6 10*3/uL (ref 0.1–1.0)
NEUTROS PCT: 70 %
Neutro Abs: 6.2 10*3/uL (ref 1.7–7.7)
Platelets: 242 10*3/uL (ref 150–400)
RBC: 4.53 MIL/uL (ref 4.22–5.81)
RDW: 14.2 % (ref 11.5–15.5)
WBC: 8.9 10*3/uL (ref 4.0–10.5)

## 2015-05-19 LAB — URINALYSIS, ROUTINE W REFLEX MICROSCOPIC
BILIRUBIN URINE: NEGATIVE
Glucose, UA: >1000 mg/dL — AB
KETONES UR: NEGATIVE mg/dL
Leukocytes, UA: NEGATIVE
NITRITE: NEGATIVE
PROTEIN: NEGATIVE mg/dL
Specific Gravity, Urine: 1.014 (ref 1.005–1.030)
UROBILINOGEN UA: 0.2 mg/dL (ref 0.0–1.0)
pH: 7 (ref 5.0–8.0)

## 2015-05-19 LAB — CBG MONITORING, ED
GLUCOSE-CAPILLARY: 383 mg/dL — AB (ref 65–99)
GLUCOSE-CAPILLARY: 322 mg/dL — AB (ref 65–99)
Glucose-Capillary: 293 mg/dL — ABNORMAL HIGH (ref 65–99)

## 2015-05-19 LAB — GLUCOSE, CAPILLARY: Glucose-Capillary: 483 mg/dL — ABNORMAL HIGH (ref 65–99)

## 2015-05-19 LAB — I-STAT VENOUS BLOOD GAS, ED
Acid-Base Excess: 15 mmol/L — ABNORMAL HIGH (ref 0.0–2.0)
BICARBONATE: 41.7 meq/L — AB (ref 20.0–24.0)
O2 Saturation: 62 %
PH VEN: 7.436 — AB (ref 7.250–7.300)
TCO2: 44 mmol/L (ref 0–100)
pCO2, Ven: 61.9 mmHg — ABNORMAL HIGH (ref 45.0–50.0)
pO2, Ven: 32 mmHg (ref 30.0–45.0)

## 2015-05-19 LAB — BASIC METABOLIC PANEL
Anion gap: 16 — ABNORMAL HIGH (ref 5–15)
BUN: 25 mg/dL — AB (ref 6–20)
CHLORIDE: 81 mmol/L — AB (ref 101–111)
CO2: 34 mmol/L — AB (ref 22–32)
CREATININE: 1.26 mg/dL — AB (ref 0.61–1.24)
Calcium: 9.8 mg/dL (ref 8.9–10.3)
GFR calc Af Amer: >60 mL/min (ref >60)
GFR calc non Af Amer: >60 mL/min (ref >60)
GLUCOSE: 501 mg/dL — AB (ref 65–99)
POTASSIUM: 3.6 mmol/L (ref 3.5–5.1)
SODIUM: 131 mmol/L — AB (ref 135–145)

## 2015-05-19 LAB — URINE MICROSCOPIC-ADD ON

## 2015-05-19 LAB — BRAIN NATRIURETIC PEPTIDE: B Natriuretic Peptide: 50.5 pg/mL (ref 0.0–100.0)

## 2015-05-19 SURGERY — CORONARY ARTERY BYPASS GRAFTING (CABG)
Anesthesia: General | Site: Chest

## 2015-05-19 MED ORDER — FENTANYL CITRATE (PF) 250 MCG/5ML IJ SOLN
INTRAMUSCULAR | Status: AC
  Filled 2015-05-19: qty 5

## 2015-05-19 MED ORDER — CHLORHEXIDINE GLUCONATE 4 % EX LIQD: 60.0000 mL | Freq: Once | CUTANEOUS | Status: DC

## 2015-05-19 MED ORDER — DEXTROSE-NACL 5-0.45 % IV SOLN: INTRAVENOUS | Status: DC

## 2015-05-19 MED ORDER — MIDAZOLAM HCL 10 MG/2ML IJ SOLN
INTRAMUSCULAR | Status: AC
  Filled 2015-05-19: qty 4

## 2015-05-19 MED ORDER — HEPARIN SODIUM (PORCINE) 1000 UNIT/ML IJ SOLN
INTRAMUSCULAR | Status: AC
  Filled 2015-05-19: qty 1

## 2015-05-19 MED ORDER — PROPOFOL 10 MG/ML IV BOLUS
INTRAVENOUS | Status: AC
  Filled 2015-05-19: qty 20

## 2015-05-19 MED ORDER — SUCCINYLCHOLINE CHLORIDE 20 MG/ML IJ SOLN
INTRAMUSCULAR | Status: AC
  Filled 2015-05-19: qty 1

## 2015-05-19 MED ORDER — EPHEDRINE SULFATE 50 MG/ML IJ SOLN
INTRAMUSCULAR | Status: AC
  Filled 2015-05-19: qty 1

## 2015-05-19 MED ORDER — ROCURONIUM BROMIDE 50 MG/5ML IV SOLN
INTRAVENOUS | Status: AC
  Filled 2015-05-19: qty 3

## 2015-05-19 MED ORDER — ARTIFICIAL TEARS OP OINT
TOPICAL_OINTMENT | OPHTHALMIC | Status: AC
  Filled 2015-05-19: qty 3.5

## 2015-05-19 MED ORDER — INSULIN REGULAR HUMAN 100 UNIT/ML IJ SOLN
INTRAMUSCULAR | Status: DC
  Administered 2015-05-19: 3.2 [IU]/h via INTRAVENOUS
  Filled 2015-05-19: qty 2.5

## 2015-05-19 MED ORDER — CHLORHEXIDINE GLUCONATE 4 % EX LIQD: 30.0000 mL | CUTANEOUS | Status: DC

## 2015-05-19 MED ORDER — SODIUM CHLORIDE 0.9 % IV BOLUS (SEPSIS)
1000.0000 mL | Freq: Once | INTRAVENOUS | Status: AC
  Administered 2015-05-19: 1000 mL via INTRAVENOUS

## 2015-05-19 MED ORDER — SODIUM CHLORIDE 0.9 % IV SOLN: INTRAVENOUS | Status: DC

## 2015-05-19 MED ORDER — PHENYLEPHRINE HCL 10 MG/ML IJ SOLN
INTRAMUSCULAR | Status: AC
  Filled 2015-05-19: qty 1

## 2015-05-19 MED ORDER — VANCOMYCIN HCL IN DEXTROSE 1-5 GM/200ML-% IV SOLN: 1000.0000 mg | INTRAVENOUS | Status: DC

## 2015-05-19 NOTE — ED Notes (Addendum)
Per Short Stay RN- pt here for clip procedure. Pt CBG noted to be 483, surgeon unable to perform surgery with CBG that elevated. A1C noted to be 10 recently. Pt took 48 units of insulin last night. Surgeon spoke to admitting about possible treatment for blood sugar in ED with possible admission.   Pt placed on our monitor in ED. Noted to have O2 sats 88% on room air. Asked pt if he wears O2, pt states yes he wears this at home. Pt placed on 2L O2 sats improved to 98%. BP noted to be hypotensive at 99/52.

## 2015-05-19 NOTE — Progress Notes (Signed)
Cardiothoracic Surgery  Patient had a Hgb A1c preop of 10.7. His last one in March 2016 was 7.9. It has been over 10 most of the time since 2008. His glucose was 450 when he showed up to short stay this am. He has asymptomatic carotid disease and stable but severe coronary disease with only occasional symptoms. I think surgery needs to be canceled due to high risk of complications with uncontrolled DM. He is already at increased risk for wound complications due to body habitus. His sister is with him this am and says that he is totally non-compliant with diet drinking Pepsi all day long. He was sent to the ER for evaluation and treatment. I think we should wait until his Hgb A1c comes down before proceeding with surgery. Will follow up in the office.

## 2015-05-19 NOTE — ED Notes (Signed)
Pt attempted to provide urine sample. Pt unable at this time.

## 2015-05-19 NOTE — Discharge Instructions (Signed)
Your blood sugar was very elevated today. There is no sign of diabetic ketoacidosis. Your blood sugar has been lowered in the emergency department. I discussed with your cardiothoracic surgeon, Dr. Cyndia Bent, who states your blood sugar needs to be much better controlled before he can do the Coronary Bypass. It is very important that you call your primary care physician today to set up an appointment this week to get your diabetes under better control. Call Dr. Vivi Martens office to set up an appointment since the surgery was cancelled.

## 2015-05-19 NOTE — Anesthesia Preprocedure Evaluation (Addendum)
Anesthesia Evaluation  Patient identified by MRN, date of birth, ID band Patient awake    Reviewed: Allergy & Precautions, NPO status , Patient's Chart, lab work & pertinent test results  History of Anesthesia Complications Negative for: history of anesthetic complications  Airway        Dental  (+) Dental Advisory Given   Pulmonary shortness of breath and with exertion, COPD,  oxygen dependent, former smoker,           Cardiovascular hypertension, Pt. on medications and Pt. on home beta blockers + Past MI, + Peripheral Vascular Disease and +CHF  + dysrhythmias Atrial Fibrillation   TTE 5/16: EF 55%; A-fib; mild LVH; normal RV function; trivial MR   Neuro/Psych PSYCHIATRIC DISORDERS Anxiety Mental retardationCVA, No Residual Symptoms    GI/Hepatic GERD  Medicated,  Endo/Other  diabetes, Type 2Morbid obesity  Renal/GU      Musculoskeletal   Abdominal   Peds  Hematology  (+) Blood dyscrasia, anemia ,   Anesthesia Other Findings   Reproductive/Obstetrics                          Anesthesia Physical Anesthesia Plan  ASA: IV  Anesthesia Plan: General   Post-op Pain Management:    Induction: Intravenous  Airway Management Planned: Oral ETT  Additional Equipment: Arterial line, PA Cath, CVP, Ultrasound Guidance Line Placement and TEE  Intra-op Plan: Utilization Of Total Body Hypothermia per surgeon request  Post-operative Plan: Post-operative intubation/ventilation  Informed Consent:   Dental advisory given  Plan Discussed with:   Anesthesia Plan Comments:        Anesthesia Quick Evaluation

## 2015-05-19 NOTE — ED Provider Notes (Signed)
CSN: [id number]     Arrival date & time 05/19/15  0750 History   First MD Initiated Contact with Patient 05/19/15 [phone]     Chief Complaint  Patient presents with  . Hyperglycemia     (Consider location/radiation/quality/duration/timing/severity/associated sxs/prior Treatment) HPI  59 year old male presents from preop with hyperglycemia. He was due to have a CABG today but when his sugar was noted to be over 480 he was sent down to the ER for better control of his glucose as well as likely admission. The patient and family state the patient's glucose has always been poorly controlled. His sugar typically runs in the 200 or 300 range. He is on metformin and insulin. Patient took his insulin last night. He has not eaten this morning. He has no other complaints such as fever, chest pain, abdominal pain, or vomiting or diarrhea. The patient feels chronically fatigued and chronically short of breath especially with exertion but none of that is new or worse. He was noted to have oxygen sats of 88% on room air, wears oxygen as needed.  Past Medical History  Diagnosis Date  . Hypertension   . Mental retardation     Sister helps to take care of him and takes him to appts  . Tobacco user     Smokes 1ppd for multiple years.  Quit after hosp 09/2010.  Marland Kitchen Anemia     History of Iron Def Anemia  . Anxiety   . Hyperlipidemia   . PAF (paroxysmal atrial fibrillation) ([place]) 06/2009    CHADS score 2 (HTN, DM), was not on coumadin, but now on Pradaxa for Afib  . Lung nodule   . Chronic low back pain   . CVA (cerebral infarction) 09/2010    Bilateral with Left > Right  . Abdominal hernia     Chronic, not a good surgical candidate  . Carotid artery occlusion   . Obesity   . COPD (chronic obstructive pulmonary disease) ([place])   . Abscess, abdomen ([place]) 12/31/2010    Referred to Wound Care in 01/2011 because of multiple abd abscess with VERY large ventral hernia (please look at image of CT abd/pelvis 09/2010).   Because of hernia I was hesitant to I&D.      Marland Kitchen GERD (gastroesophageal reflux disease)   . Pneumonia     hx of  . Chronic diastolic CHF (congestive heart failure) (H[facility])     takes Lasix  . Itching     all over body; pt scratches and has sores on bilateral arms and abdomen  . Hx of echocardiogram     Echo 5/16:  Mild LVH, EF 55%, indeterm. diast function, WMA could not be ruled out, MAC, trivial MR, mild LAE, normal RVF  . Myocardial infarction [facility]) 2016 ?    Heart attack  (  Per  pt. )  . History of nuclear stress test     Myoview 9/16:  Inferior, apical and inf-lateral ischemia; not gated; High Risk  . Shortness of breath dyspnea     with exertion  . Diabetes mellitus   . Stroke ([place])   . Oxygen dependent     wears 2 liters at bedtime and when needed   Past Surgical History  Procedure Laterality Date  . Carotid arteriogram  10/2010    30% right ICA stenosis, 40% left ICA stenosis   . Transesophageal echocardiogram  09/2010    No ASD or PFO. EF 60-65%.  Normal systolic function. No evidence of thrombus.   Marland Kitchen  Transthoracic echocardiogram  09/2010     The cavity size was normal. Systolic function was vigorous.  EF 65-70%.  Normal wall funciton.   . Debridment of decubitus ulcer Right 02/13/2013  . I&d extremity Right 02/13/2013    Procedure: IRRIGATION AND DEBRIDEMENT FOOT ULCER;  Surgeon: Johnny Bridge, MD;  Location: [place];  Service: Orthopedics;  Laterality: Right;  PULSE LAVAGE  . Cataract extraction, bilateral    . Arm surgery Left     as a child  . Amputation Right 03/29/2013    Procedure: AMPUTATION RAY;  Surgeon: Newt Minion, MD;  Location: [place];  Service: Orthopedics;  Laterality: Right;  Right Foot 3rd and Possible 4th Ray Amputation  . Multiple tooth extractions    . Amputation Right 04/23/2013    Procedure: AMPUTATION RIGHT MID-FOOT;  Surgeon: Newt Minion, MD;  Location: [place];  Service: Orthopedics;  Laterality: Right;  . Esophagogastroduodenoscopy (egd) with  propofol N/A 06/10/2014    Procedure: ESOPHAGOGASTRODUODENOSCOPY (EGD) WITH PROPOFOL;  Surgeon: Jerene Bears, MD;  Location: [facility];  Service: Gastroenterology;  Laterality: N/A;  . Colonoscopy with propofol N/A 06/10/2014    Procedure: COLONOSCOPY WITH PROPOFOL;  Surgeon: Jerene Bears, MD;  Location: [facility];  Service: Gastroenterology;  Laterality: N/A;  . Givens capsule study N/A 07/09/2014    Procedure: GIVENS CAPSULE STUDY;  Surgeon: Jerene Bears, MD;  Location: [facility];  Service: Gastroenterology;  Laterality: N/A;  . Cardiac catheterization N/A 04/30/2015    Procedure: Left Heart Cath and Coronary Angiography;  Surgeon: Belva Crome, MD;  Location: [facility];  Service: Cardiovascular;  Laterality: N/A;   Family History  Problem Relation Age of Onset  . Heart disease Mother   . Hypertension Sister   . Heart disease Sister   . Heart disease Brother   . Heart disease Father   . Peripheral vascular disease Father   . Heart disease Maternal Grandmother   . Heart attack Maternal Grandmother   . Colon cancer Neg Hx   . Breast cancer Maternal Grandmother   . Stomach cancer Maternal Uncle   . Stroke Neg Hx    Social History  Substance Use Topics  . Smoking status: Former Smoker -- 1.00 packs/day for 42 years    Types: Cigarettes    Quit date: 11/10/2010  . Smokeless tobacco: Former Systems developer    Quit date: 10/02/2010  . Alcohol Use: 0.0 oz/week    0 Standard drinks or equivalent per week     Comment: rare    Review of Systems  Constitutional: Positive for fatigue. Negative for fever.  Respiratory: Positive for shortness of breath (chronic, not worse than normal).   Cardiovascular: Negative for chest pain and leg swelling.  Gastrointestinal: Negative for vomiting, abdominal pain and diarrhea.  All other systems reviewed and are negative.     Allergies  Penicillins  Home Medications   Prior to Admission medications   Medication Sig Start Date End Date  Taking? Authorizing Provider  aspirin EC 81 MG EC tablet Take 1 tablet (81 mg total) by mouth daily. 11/11/12  Yes Kandis Nab, MD  Cholecalciferol (VITAMIN D3) 1000 UNITS CAPS Take 1,000 Units by mouth daily.   Yes Historical Provider, MD  diltiazem (CARDIZEM CD) 180 MG 24 hr capsule Take 1 capsule (180 mg total) by mouth daily. 11/03/14  Yes Janece Canterbury, MD  furosemide (LASIX) 80 MG tablet Take 1 tablet (80 mg total) by mouth 2 (two) times  daily. 11/03/14  Yes Janece Canterbury, MD  insulin glargine (LANTUS) 100 UNIT/ML injection Inject 60 Units into the skin at bedtime.    Yes Historical Provider, MD  isosorbide mononitrate (IMDUR) 30 MG 24 hr tablet Take 30 mg by mouth every morning.   Yes Historical Provider, MD  metoprolol succinate (TOPROL-XL) 50 MG 24 hr tablet Take 1 tablet (50 mg total) by mouth as directed. Take 1/2 tab daily per [facility] 12/22/14; Take with or immediately following a meal. 12/23/14  Yes Aaren Atallah T Weaver, PA-C  omeprazole (PRILOSEC) 20 MG capsule Take 20 mg by mouth daily.   Yes Historical Provider, MD  PARoxetine (PAXIL) 40 MG tablet Take 40 mg by mouth every morning.   Yes Historical Provider, MD  atorvastatin (LIPITOR) 80 MG tablet Take 1 tablet (80 mg total) by mouth daily at 6 PM. 11/03/14   Janece Canterbury, MD  clonazePAM (KLONOPIN) 0.5 MG tablet Take 1 tablet (0.5 mg total) by mouth at bedtime. 11/03/14   Janece Canterbury, MD  dabigatran (PRADAXA) 150 MG CAPS capsule Take 1 capsule (150 mg total) by mouth every 12 (twelve) hours. 05/14/15   Liliane Shi, PA-C  ferrous gluconate (FERGON) 324 MG tablet Take 1 tablet (324 mg total) by mouth 2 (two) times daily with a meal. 11/03/14   Janece Canterbury, MD  gabapentin (NEURONTIN) 300 MG capsule Take 1 capsule (300 mg total) by mouth 2 (two) times daily. 11/03/14   Janece Canterbury, MD  HYDROcodone-acetaminophen (NORCO/VICODIN) 5-325 MG per tablet 1 by mouth once daily as needed for pain control 11/05/14   Estill Dooms, MD   hydrocortisone 2.5 % ointment Apply topically 3 (three) times daily. To leg around wound. Do not apply to broken skin. 06/05/12   Leeanne Rio, MD  magnesium oxide (MAG-OX) 400 MG tablet Take 400 mg by mouth daily.    Historical Provider, MD  metFORMIN (GLUCOPHAGE) 1000 MG tablet Take 1,000 mg by mouth 2 (two) times daily with a meal.    Historical Provider, MD  nitroGLYCERIN (NITROSTAT) 0.4 MG SL tablet Place 0.4 mg under the tongue every 5 (five) minutes as needed for chest pain.  07/23/12   Leeanne Rio, MD  saccharomyces boulardii (FLORASTOR) 250 MG capsule Take 1 capsule (250 mg total) by mouth 2 (two) times daily. 11/03/14   Janece Canterbury, MD   BP 119/56 mmHg  Pulse 68  Temp(Src) 97.8 F (36.6 C) (Oral)  Resp 18  Ht 5' (1.524 m)  Wt 210 lb (95.255 kg)  BMI 41.01 kg/m2  SpO2 98% Physical Exam  Constitutional: He is oriented to person, place, and time. He appears well-developed and well-nourished.  HENT:  Head: Normocephalic and atraumatic.  Right Ear: External ear normal.  Left Ear: External ear normal.  Nose: Nose normal.  Eyes: Right eye exhibits no discharge. Left eye exhibits no discharge.  Neck: Neck supple.  Cardiovascular: Normal rate, regular rhythm, normal heart sounds and intact distal pulses.   Pulmonary/Chest: Effort normal. He has rales (mild bibasilar rales).  Abdominal: Soft. He exhibits no distension. There is no tenderness.  Musculoskeletal: He exhibits no edema.  Neurological: He is alert and oriented to person, place, and time.  Skin: Skin is warm and dry.  Nursing note and vitals reviewed.   ED Course  Procedures (including critical care time) Labs Review Labs Reviewed  GLUCOSE, CAPILLARY - Abnormal; Notable for the following:    Glucose-Capillary 483 (*)    All other components within normal limits  BASIC METABOLIC PANEL - Abnormal; Notable for the following:    Sodium 131 (*)    Chloride 81 (*)    CO2 34 (*)    Glucose, Bld 501  (*)    BUN 25 (*)    Creatinine, Ser 1.26 (*)    Anion gap 16 (*)    All other components within normal limits  URINALYSIS, ROUTINE W REFLEX MICROSCOPIC (NOT AT [facility]) - Abnormal; Notable for the following:    Glucose, UA >1000 (*)    Hgb urine dipstick TRACE (*)    All other components within normal limits  CBC WITH DIFFERENTIAL/PLATELET - Abnormal; Notable for the following:    Hemoglobin 12.2 (*)    HCT 36.9 (*)    All other components within normal limits  I-STAT VENOUS BLOOD GAS, ED - Abnormal; Notable for the following:    pH, Ven 7.436 (*)    pCO2, Ven 61.9 (*)    Bicarbonate 41.7 (*)    Acid-Base Excess 15.0 (*)    All other components within normal limits  CBG MONITORING, ED - Abnormal; Notable for the following:    Glucose-Capillary 383 (*)    All other components within normal limits  CBG MONITORING, ED - Abnormal; Notable for the following:    Glucose-Capillary 322 (*)    All other components within normal limits  CBG MONITORING, ED - Abnormal; Notable for the following:    Glucose-Capillary 293 (*)    All other components within normal limits  BRAIN NATRIURETIC PEPTIDE  URINE MICROSCOPIC-ADD ON    Imaging Review Dg Chest 2 View  05/19/2015   CLINICAL DATA:  Shortness of Breath  EXAM: CHEST  2 VIEW  COMPARISON:  May 15, 2015  FINDINGS: There is no edema or consolidation. Heart size and pulmonary vascularity are normal. No adenopathy. There are foci of coronary artery calcification in the left anterior descending coronary artery. No bone lesions.  IMPRESSION: Coronary artery calcification noted.  No edema or consolidation.   Electronically Signed   By: Lowella Grip III M.D.   On: 05/19/2015 08:42   I have personally reviewed and evaluated these images and lab results as part of my medical decision-making.   EKG Interpretation None      MDM   Final diagnoses:  Hyperglycemia without ketosis    Patient has hyperglycemia but no ketosis. Very small anion  gap elevation but pH is normal. No decreased bicarbonate serum. Workup is not consistent with DKA. He was given fluids and insulin and his glucoses decreased appropriately. I discussed with his CT surgeon, Dr. Cyndia Bent who states it is not a one time glucose issue but more of his overall poor diabetes control (A1C ~ 10). Patient will need to have overall better outpatient control through his primary doctor. There is no indication to admit the patient. He has not having any anginal symptoms and has no emergent need for CABG based on my discussion with CT surgery. Discussed the importance of better glycemic control and the importance of close follow-up with his PCP.   Sherwood Gambler, MD 05/19/15 [phone]

## 2015-05-19 NOTE — ED Notes (Signed)
Patient transported to X-ray 

## 2015-05-19 NOTE — Progress Notes (Signed)
Notified Dr. Cyndia Bent of CBG. Per Dr. Cyndia Bent cancel case and he will see patient to admit. OR desk notified they will notify Dr. Scot Dock.

## 2015-05-19 NOTE — Progress Notes (Signed)
Per Dr. Cyndia Bent patient transferred to ER and report given to ER nurse

## 2015-06-11 ENCOUNTER — Ambulatory Visit: Payer: Medicare (Managed Care) | Admitting: Physician Assistant

## 2015-06-15 ENCOUNTER — Encounter: Payer: Self-pay | Admitting: Physician Assistant

## 2015-06-15 ENCOUNTER — Ambulatory Visit (INDEPENDENT_AMBULATORY_CARE_PROVIDER_SITE_OTHER): Payer: Medicare (Managed Care) | Admitting: Physician Assistant

## 2015-06-15 VITALS — BP 110/68 | HR 70 | Ht 60.0 in | Wt 209.0 lb

## 2015-06-15 DIAGNOSIS — I480 Paroxysmal atrial fibrillation: Secondary | ICD-10-CM

## 2015-06-15 DIAGNOSIS — N183 Chronic kidney disease, stage 3 (moderate): Secondary | ICD-10-CM

## 2015-06-15 DIAGNOSIS — I6523 Occlusion and stenosis of bilateral carotid arteries: Secondary | ICD-10-CM

## 2015-06-15 DIAGNOSIS — I255 Ischemic cardiomyopathy: Secondary | ICD-10-CM

## 2015-06-15 DIAGNOSIS — I2510 Atherosclerotic heart disease of native coronary artery without angina pectoris: Secondary | ICD-10-CM

## 2015-06-15 DIAGNOSIS — I10 Essential (primary) hypertension: Secondary | ICD-10-CM | POA: Diagnosis not present

## 2015-06-15 DIAGNOSIS — I5042 Chronic combined systolic (congestive) and diastolic (congestive) heart failure: Secondary | ICD-10-CM

## 2015-06-15 DIAGNOSIS — E785 Hyperlipidemia, unspecified: Secondary | ICD-10-CM

## 2015-06-15 MED ORDER — METOPROLOL SUCCINATE ER 50 MG PO TB24: 50.0000 mg | ORAL_TABLET | Freq: Two times a day (BID) | ORAL | Status: DC

## 2015-06-15 NOTE — Progress Notes (Signed)
Cardiology Office Note   Date:  06/15/2015   ID:  [name], DOB 1956/02/27, MRN [id number]  PCP:  [name], MD  Cardiologist:  [name]   Vascular Surgeon:  Dr. Scot Dock   No chief complaint on file.    History of Present Illness: [name] is a 59 y.o. male with a hx of PAF, HTN, diabetes, HL, prior stroke, carotid stenosis, COPD, mental retardation. He was evaluated by Dr. Acie Fredrickson in 08/2012 for chest pain. Myoview was low risk. Echocardiogram in 3/14 demonstrated normal LV function with mild diastolic dysfunction. He has a history of diabetic ulcer and underwent right midfoot amputation in 04/2013.  Admitted 10/2014 with acute hypoxic respiratory failure secondary to a/c diastolic CHF, AECOPD and AF with RVR in the setting of sepsis syndrome related to left lower extremity cellulitis from diabetic foot ulcer.  He had elevated troponins possibly related to demand ischemia.  He converted to sinus rhythm with rate control therapy.   Given his multiple comorbid conditions, it was decided to treat him conservatively for his non-STEMI.  In follow-up, continued conservative management was recommended for his recent non-STEMI. However, he was seen Dr. Scot Dock with vascular surgery and noted to have progressive carotid artery stenosis on the left.  He needs L CEA.  Myoview was arranged for risk stratification and demonstrated inferior, apical, inferolateral ischemia. Study was felt to be high risk.   LHC 04/30/15 demonstrated 3v CAD.  CABG has been recommended.   He was evaluated by Dr. Cyndia Bent and surgery was planned (CABG + CEA) on 10/11.  However, when he presented his A1c was noted to be 10.7 and his CBG was 501.  He was felt to be high risk for perioperative complications.  CABG+CEA was postponed.    He returns for FU.  He is doing well.  He tells me that his sugars are 100 or less since last month. He denies chest pain. He has chronic DOE without change.  Denies orthopnea, PND, edema.  No syncope.       Studies/Reports Reviewed Today:  LHC 04/30/15 LAD: Ostial and mid 95%, distal 40%, ostial D1 100%, ostial D2 85% RI: Ostial 65% LCx: Proximal to mid 70%, mid 65%, OM2 90% RCA: Ostial 80%, proximal to distal 100%, distal 100%, RPDA 85%, RPAVB 100% EF 35-45%     Myoview 04/23/15 Large, mostly reversible inferior, apical and inferolateral reversible perfusion defect suggestive of ischemia (SDS 10, Extent 37%). This study was not-gated due to atrial fibrillation. This is a high-risk study based on the size and severity of the perfusion defect.  Carotid US 8/16 R 60-79% L 80-99%  Echo 12/18/14 Mild LVH, EF 55%, wall motion abnormalities could not be excluded, MAC, trivial MR, mild LAE  Echo 10/16/12 Mild LVH, vigorous LVF, EF 65-70%, normal wall motion, grade 1 diastolic dysfunction  Nuclear study 09/06/12 Myoview scan with small apical defect Seen in horizontal images only. Cannot exclude tiny region of apical ischemia vs shift soft tissue. Otherwise normal perfusion. Overall low risk scan.  LV Ejection Fraction: 69%. LV Wall Motion: NL LV Function; NL Wall Motion    Past Medical History  Diagnosis Date  . Hypertension   . Mental retardation     Sister helps to take care of him and takes him to appts  . Tobacco user     Smokes 1ppd for multiple years.  Quit after hosp 09/2010.  Marland Kitchen Anemia     History of Iron Def Anemia  .  Anxiety   . Hyperlipidemia   . PAF (paroxysmal atrial fibrillation) ([place]) 06/2009    CHADS score 2 (HTN, DM), was not on coumadin, but now on Pradaxa for Afib  . Lung nodule   . Chronic low back pain   . CVA (cerebral infarction) 09/2010    Bilateral with Left > Right  . Abdominal hernia     Chronic, not a good surgical candidate  . Carotid artery occlusion   . Obesity   . COPD (chronic obstructive pulmonary disease) ([place])   . Abscess, abdomen ([place]) 12/31/2010    Referred to Wound Care in 01/2011 because  of multiple abd abscess with VERY large ventral hernia (please look at image of CT abd/pelvis 09/2010).  Because of hernia I was hesitant to I&D.      Marland Kitchen GERD (gastroesophageal reflux disease)   . Pneumonia     hx of  . Chronic diastolic CHF (congestive heart failure) ([facility])     takes Lasix  . Itching     all over body; pt scratches and has sores on bilateral arms and abdomen  . Hx of echocardiogram     Echo 5/16:  Mild LVH, EF 55%, indeterm. diast function, WMA could not be ruled out, MAC, trivial MR, mild LAE, normal RVF  . Myocardial infarction [facility]) 2016 ?    Heart attack  (  Per  pt. )  . History of nuclear stress test     Myoview 9/16:  Inferior, apical and inf-lateral ischemia; not gated; High Risk  . Shortness of breath dyspnea     with exertion  . Diabetes mellitus   . Stroke ([place])   . Oxygen dependent     wears 2 liters at bedtime and when needed    Past Surgical History  Procedure Laterality Date  . Carotid arteriogram  10/2010    30% right ICA stenosis, 40% left ICA stenosis   . Transesophageal echocardiogram  09/2010    No ASD or PFO. EF 60-65%.  Normal systolic function. No evidence of thrombus.   . Transthoracic echocardiogram  09/2010     The cavity size was normal. Systolic function was vigorous.  EF 65-70%.  Normal wall funciton.   . Debridment of decubitus ulcer Right 02/13/2013  . I&d extremity Right 02/13/2013    Procedure: IRRIGATION AND DEBRIDEMENT FOOT ULCER;  Surgeon: Johnny Bridge, MD;  Location: [place];  Service: Orthopedics;  Laterality: Right;  PULSE LAVAGE  . Cataract extraction, bilateral    . Arm surgery Left     as a child  . Amputation Right 03/29/2013    Procedure: AMPUTATION RAY;  Surgeon: Newt Minion, MD;  Location: [place];  Service: Orthopedics;  Laterality: Right;  Right Foot 3rd and Possible 4th Ray Amputation  . Multiple tooth extractions    . Amputation Right 04/23/2013    Procedure: AMPUTATION RIGHT MID-FOOT;  Surgeon: Newt Minion, MD;   Location: [place];  Service: Orthopedics;  Laterality: Right;  . Esophagogastroduodenoscopy (egd) with propofol N/A 06/10/2014    Procedure: ESOPHAGOGASTRODUODENOSCOPY (EGD) WITH PROPOFOL;  Surgeon: Jerene Bears, MD;  Location: [facility];  Service: Gastroenterology;  Laterality: N/A;  . Colonoscopy with propofol N/A 06/10/2014    Procedure: COLONOSCOPY WITH PROPOFOL;  Surgeon: Jerene Bears, MD;  Location: [facility];  Service: Gastroenterology;  Laterality: N/A;  . Givens capsule study N/A 07/09/2014    Procedure: GIVENS CAPSULE STUDY;  Surgeon: Jerene Bears, MD;  Location: [facility];  Service: Gastroenterology;  Laterality: N/A;  . Cardiac catheterization N/A 04/30/2015    Procedure: Left Heart Cath and Coronary Angiography;  Surgeon: Belva Crome, MD;  Location: [facility];  Service: Cardiovascular;  Laterality: N/A;     Current Outpatient Prescriptions  Medication Sig Dispense Refill  . aspirin EC 81 MG EC tablet Take 1 tablet (81 mg total) by mouth daily. 90 tablet 3  . atorvastatin (LIPITOR) 80 MG tablet Take 1 tablet (80 mg total) by mouth daily at 6 PM. 45 tablet 0  . Cholecalciferol (VITAMIN D3) 1000 UNITS CAPS Take 1,000 Units by mouth daily.    . clonazePAM (KLONOPIN) 0.5 MG tablet Take 1 tablet (0.5 mg total) by mouth at bedtime. 30 tablet 0  . dabigatran (PRADAXA) 150 MG CAPS capsule Take 1 capsule (150 mg total) by mouth every 12 (twelve) hours. 60 capsule 11  . ferrous gluconate (FERGON) 324 MG tablet Take 1 tablet (324 mg total) by mouth 2 (two) times daily with a meal. 60 tablet 0  . furosemide (LASIX) 80 MG tablet Take 1 tablet (80 mg total) by mouth 2 (two) times daily. 60 tablet 0  . gabapentin (NEURONTIN) 300 MG capsule Take 1 capsule (300 mg total) by mouth 2 (two) times daily. 60 capsule 0  . HYDROcodone-acetaminophen (NORCO/VICODIN) 5-325 MG per tablet 1 by mouth once daily as needed for pain control 30 tablet 0  . hydrocortisone 2.5 % ointment Apply topically  3 (three) times daily. To leg around wound. Do not apply to broken skin. 30 g 0  . insulin glargine (LANTUS) 100 UNIT/ML injection Inject 60 Units into the skin at bedtime.     . isosorbide mononitrate (IMDUR) 30 MG 24 hr tablet Take 30 mg by mouth every morning.    . magnesium oxide (MAG-OX) 400 MG tablet Take 400 mg by mouth daily.    . metFORMIN (GLUCOPHAGE) 1000 MG tablet Take 1,000 mg by mouth 2 (two) times daily with a meal.    . metoprolol succinate (TOPROL-XL) 50 MG 24 hr tablet Take 1 tablet (50 mg total) by mouth 2 (two) times daily. Take with or immediately following a meal.    . nitroGLYCERIN (NITROSTAT) 0.4 MG SL tablet Place 0.4 mg under the tongue every 5 (five) minutes as needed for chest pain.     Marland Kitchen omeprazole (PRILOSEC) 20 MG capsule Take 20 mg by mouth daily.    Marland Kitchen PARoxetine (PAXIL) 40 MG tablet Take 40 mg by mouth every morning.    . saccharomyces boulardii (FLORASTOR) 250 MG capsule Take 1 capsule (250 mg total) by mouth 2 (two) times daily. 60 capsule 2   No current facility-administered medications for this visit.    Allergies:   Penicillins    Social History:  The patient  reports that he quit smoking about 4 years ago. His smoking use included Cigarettes. He has a 42 pack-year smoking history. He quit smokeless tobacco use about 4 years ago. He reports that he drinks alcohol. He reports that he does not use illicit drugs.   Family History:  The patient's family history includes Breast cancer in his maternal grandmother; Heart attack in his maternal grandmother; Heart disease in his brother, father, maternal grandmother, and mother; Hypertension in his sister; Peripheral vascular disease in his father; Stomach cancer in his maternal uncle. There is no history of Colon cancer or Stroke.    ROS:   Please see the history of present illness.   Review of Systems  All other systems reviewed and are negative.    PHYSICAL EXAM: VS:  BP 110/68 mmHg  Pulse 70  Ht 5'  (1.524 m)  Wt 209 lb (94.802 kg)  BMI 40.82 kg/m2  SpO2 91%    Wt Readings from Last 3 Encounters:  06/15/15 209 lb (94.802 kg)  05/19/15 210 lb (95.255 kg)  05/14/15 210 lb 6.4 oz (95.437 kg)     GEN: WN/WD male, in no acute distress HEENT: normal Neck: I cannot assess JVD, no masses Cardiac:  Normal S1/S2, RRR; no murmur ,  no rubs or gallops, trace bilateral LE edema Respiratory:  Decreased breath sounds bilaterally, no wheezing. GI: Large ventral hernia noted, soft, + BS MS: no deformity or atrophy Skin: warm and dry  Neuro:  CNs II-XII intact, Strength and sensation are intact Psych: Normal affect   EKG:  EKG is ordered today.  It demonstrates:   NSR, HR 70, inf Q waves, RBBB, QTc 479 ms   Recent Labs: 11/01/2014: Magnesium 2.5 12/15/2014: Pro B Natriuretic peptide (BNP) 290.0* 05/15/2015: ALT 13* 05/19/2015: B Natriuretic Peptide 50.5; BUN 25*; Creatinine, Ser 1.26*; Hemoglobin 12.2*; Platelets 242; Potassium 3.6; Sodium 131*    Lipid Panel    Component Value Date/Time   CHOL 124 11/10/2012 0255   TRIG 112 11/10/2012 0255   HDL 24* 11/10/2012 0255   CHOLHDL 5.2 11/10/2012 0255   VLDL 22 11/10/2012 0255   LDLCALC 78 11/10/2012 0255   LDLDIRECT 54 08/23/2012 1447     ASSESSMENT AND PLAN:  1. CAD:    History of non-STEMI in 10/2014 while hospitalized for acute, multiple, medical illnesses.  Recent high risk Myoview with a large area of inferior ischemia.  [place] 9/22 demonstrated severe 3v CAD and reduced EF 35-45%.  He was scheduled for CABG with Dr. Cyndia Bent 10/11 but this was canceled due to uncontrolled diabetes.  Continue ASA, Lipitor, beta-blocker, nitrates.  FU with Dr. Cyndia Bent is pending.   2. Chronic combined systolic and diastolic CHF (congestive heart failure):  Volume stable.  Continue current regimen.     3. Ischemic Cardiomyopathy:   EF 55% by echo in 5/16.  Recent nuclear study and LHC with EF 35-45%.  He has been on Diltiazem and Toprol for rate control  with his PAF.  I had planned to try to stop his Diltiazem and transition to an ACE inhibitor post op.  Now that his surgery is on hold, I will adjust his medical Rx.  -  DC Cardizem CD  -  Increase Toprol-XL to 50 mg Twice daily   -  Consider adding ACE inhibitor in the near future.   4. Paroxysmal atrial fibrillation:  Maintaining NSR. He remains on Pradaxa for anticoagulation. He has a hx of CVA.  His Creatinine Clearance is 76.  Ok to hold Pradaxa 3 days prior to CABG.  Question if Maze will be considered at time of surgery.      5. Essential hypertension:  Controlled.  6. HYPERCHOLESTEROLEMIA:  Continue statin.    7. Carotid stenosis, bilateral:  L CEA pending.    8. CKD:  Given Diabetes, CKD, Ischemic CM and CAD he would benefit from ACE inhibitor.  His BP is marginal and he has bilateral ICA stenosis.  Will hold off on adding ACE inhibitor for now.  We can consider this in the future.      Medication Adjustments: Current medicines are reviewed at length with the patient today.  Concerns regarding medicines are as outlined above.  The following changes have been made:   Discontinued Medications   DILTIAZEM (CARDIZEM CD) 180 MG 24 HR CAPSULE    Take 1 capsule (180 mg total) by mouth daily.   Modified Medications   Modified Medication Previous Medication   METOPROLOL SUCCINATE (TOPROL-XL) 50 MG 24 HR TABLET metoprolol succinate (TOPROL-XL) 50 MG 24 hr tablet      Take 1 tablet (50 mg total) by mouth 2 (two) times daily. Take with or immediately following a meal.    Take 1 tablet (50 mg total) by mouth as directed. Take 1/2 tab daily per [facility] 12/22/14; Take with or immediately following a meal.   New Prescriptions   No medications on file   Labs/ tests ordered today include:  Orders Placed This Encounter  Procedures  . EKG 12-Lead      Disposition:    FU [name] in Dec 2016 as previously planned.  I can see him back after that.    Signed, Versie Starks,  MHS 06/15/2015 2:39 PM    [facility] [place], [place], [place]  [address] Phone: [phone]; Fax: [phone]

## 2015-06-15 NOTE — Patient Instructions (Signed)
Medication Instructions:  1. STOP DILTIAZEM   2. TAKE TOPROL XL 50 MG TWICE DAILY  Labwork: NONE  Testing/Procedures: NONE  Follow-Up: 1. KEEP YOUR APPT WITH DR. Cathie Olden 07/2015  2. YOU HAVE AN APPT WITH DR. BARTLE 06/16/15 AT 2:30   Any Other Special Instructions Will Be Listed Below (If Applicable).     If you need a refill on your cardiac medications before your next appointment, please call your pharmacy.

## 2015-06-16 ENCOUNTER — Ambulatory Visit (INDEPENDENT_AMBULATORY_CARE_PROVIDER_SITE_OTHER): Payer: Medicare (Managed Care) | Admitting: Surgery

## 2015-06-16 ENCOUNTER — Encounter: Payer: Self-pay | Admitting: Surgery

## 2015-06-16 VITALS — BP 116/63 | HR 70 | Resp 20 | Ht 60.0 in | Wt 210.0 lb

## 2015-06-16 DIAGNOSIS — I2510 Atherosclerotic heart disease of native coronary artery without angina pectoris: Secondary | ICD-10-CM

## 2015-06-16 DIAGNOSIS — I779 Disorder of arteries and arterioles, unspecified: Secondary | ICD-10-CM

## 2015-06-16 DIAGNOSIS — I739 Peripheral vascular disease, unspecified: Secondary | ICD-10-CM

## 2015-06-17 ENCOUNTER — Encounter: Payer: Self-pay | Admitting: Surgery

## 2015-06-17 NOTE — Progress Notes (Signed)
HPI:  The patient returns today for follow up after cancellation of CABG and left CEA on 05/19/2015 when his preop Hgb A1c was 10.7 and his glucose on the morning of surgery was 450 and a repeat over 500. He was seen on 11/7 by Richardson Dopp, PA for cardiology but has not been back to his PCP at [facility]. He denies any significant chest pain or shortness of breath since I last saw him. He says he has been watching his diet and checking his glucose and most have been less than 100 with the highest being 127. He says he ate a couple cupcakes this am which he knows is not good.   Current Outpatient Prescriptions  Medication Sig Dispense Refill  . aspirin EC 81 MG EC tablet Take 1 tablet (81 mg total) by mouth daily. 90 tablet 3  . atorvastatin (LIPITOR) 80 MG tablet Take 1 tablet (80 mg total) by mouth daily at 6 PM. 45 tablet 0  . Cholecalciferol (VITAMIN D3) 1000 UNITS CAPS Take 1,000 Units by mouth daily.    . clonazePAM (KLONOPIN) 0.5 MG tablet Take 1 tablet (0.5 mg total) by mouth at bedtime. 30 tablet 0  . dabigatran (PRADAXA) 150 MG CAPS capsule Take 1 capsule (150 mg total) by mouth every 12 (twelve) hours. 60 capsule 11  . ferrous gluconate (FERGON) 324 MG tablet Take 1 tablet (324 mg total) by mouth 2 (two) times daily with a meal. 60 tablet 0  . furosemide (LASIX) 80 MG tablet Take 1 tablet (80 mg total) by mouth 2 (two) times daily. 60 tablet 0  . gabapentin (NEURONTIN) 300 MG capsule Take 1 capsule (300 mg total) by mouth 2 (two) times daily. 60 capsule 0  . HYDROcodone-acetaminophen (NORCO/VICODIN) 5-325 MG per tablet 1 by mouth once daily as needed for pain control 30 tablet 0  . hydrocortisone 2.5 % ointment Apply topically 3 (three) times daily. To leg around wound. Do not apply to broken skin. 30 g 0  . insulin glargine (LANTUS) 100 UNIT/ML injection Inject 60 Units into the skin at bedtime.     . isosorbide mononitrate (IMDUR) 30 MG 24 hr tablet Take 30 mg by mouth every  morning.    . magnesium oxide (MAG-OX) 400 MG tablet Take 400 mg by mouth daily.    . metFORMIN (GLUCOPHAGE) 1000 MG tablet Take 1,000 mg by mouth 2 (two) times daily with a meal.    . metoprolol succinate (TOPROL-XL) 50 MG 24 hr tablet Take 1 tablet (50 mg total) by mouth 2 (two) times daily. Take with or immediately following a meal.    . nitroGLYCERIN (NITROSTAT) 0.4 MG SL tablet Place 0.4 mg under the tongue every 5 (five) minutes as needed for chest pain.     Marland Kitchen omeprazole (PRILOSEC) 20 MG capsule Take 20 mg by mouth daily.    Marland Kitchen PARoxetine (PAXIL) 40 MG tablet Take 40 mg by mouth every morning.    . saccharomyces boulardii (FLORASTOR) 250 MG capsule Take 1 capsule (250 mg total) by mouth 2 (two) times daily. 60 capsule 2   No current facility-administered medications for this visit.     Physical Exam: BP 116/63 mmHg  Pulse 70  Resp 20  Ht 5' (1.524 m)  Wt 210 lb (95.255 kg)  BMI 41.01 kg/m2  SpO2 92% He is in not distress Lungs are clear Cardiac exam shows a regular rate and rhythm with normal heart sounds. There is mild bilateral lower  extremity edema  Diagnostic Tests:  None today  Impression:  He is a non-compliant, poorly-controlled diabetic and I told him that I would not do surgery until his Hgb A1c is down to less than 7. His risk of infection is just too high to do surgery otherwise. He says that his glucose has been under 100 most of the time so will check a CMET and Hgb A1c to see where he is. I don't think he is a reliable patient and his sister has told me that he is not.  Plan:  Will check CMET and Hgb A1c and if satisfactory will schedule surgery. If his Hgb A1c is still elevated then he will need to follow up with his PCP to work on getting this under control.   [name], MD [facility] [phone]

## 2015-06-18 ENCOUNTER — Other Ambulatory Visit: Payer: Self-pay | Admitting: *Deleted

## 2015-06-18 DIAGNOSIS — D509 Iron deficiency anemia, unspecified: Secondary | ICD-10-CM

## 2015-06-18 DIAGNOSIS — E119 Type 2 diabetes mellitus without complications: Secondary | ICD-10-CM

## 2015-06-22 ENCOUNTER — Encounter: Payer: Self-pay | Admitting: Surgery

## 2015-06-22 ENCOUNTER — Telehealth: Payer: Self-pay | Admitting: *Deleted

## 2015-06-22 NOTE — Progress Notes (Unsigned)
Patient ID: [name], male   DOB: 02/05/1956, 59 y.o.   MRN: [id number]   His Hgb A1c on 06/17/2015 was 8.4, down from 10.7 on 05/15/2015. Blood glucose was 84. Creat 1.29 and albumin 4.1.  His Hgb A1c is heading in the right direction and since he is not having any significant symptoms at this time I think it would be best to follow this up in another month and if it is still decreasing then schedule surgery. Ideally I would like to have less than 7 before surgery. I will see him back in one month and will repeat it then.

## 2015-07-14 ENCOUNTER — Ambulatory Visit (INDEPENDENT_AMBULATORY_CARE_PROVIDER_SITE_OTHER): Payer: Medicare (Managed Care) | Admitting: Cardiovascular Disease

## 2015-07-14 ENCOUNTER — Encounter: Payer: Self-pay | Admitting: Cardiovascular Disease

## 2015-07-14 VITALS — BP 120/68 | HR 70 | Ht 60.0 in | Wt 206.0 lb

## 2015-07-14 DIAGNOSIS — I25119 Atherosclerotic heart disease of native coronary artery with unspecified angina pectoris: Secondary | ICD-10-CM

## 2015-07-14 DIAGNOSIS — E119 Type 2 diabetes mellitus without complications: Secondary | ICD-10-CM | POA: Diagnosis not present

## 2015-07-14 DIAGNOSIS — D509 Iron deficiency anemia, unspecified: Secondary | ICD-10-CM

## 2015-07-14 DIAGNOSIS — E7800 Pure hypercholesterolemia, unspecified: Secondary | ICD-10-CM | POA: Diagnosis not present

## 2015-07-14 DIAGNOSIS — I5042 Chronic combined systolic (congestive) and diastolic (congestive) heart failure: Secondary | ICD-10-CM

## 2015-07-14 DIAGNOSIS — I10 Essential (primary) hypertension: Secondary | ICD-10-CM | POA: Diagnosis not present

## 2015-07-14 DIAGNOSIS — I2510 Atherosclerotic heart disease of native coronary artery without angina pectoris: Secondary | ICD-10-CM | POA: Insufficient documentation

## 2015-07-14 LAB — COMPREHENSIVE METABOLIC PANEL
ALBUMIN: 3.7 g/dL (ref 3.6–5.1)
ALT: 10 U/L (ref 9–46)
AST: 17 U/L (ref 10–35)
Alkaline Phosphatase: 57 U/L (ref 40–115)
BUN: 35 mg/dL — ABNORMAL HIGH (ref 7–25)
CALCIUM: 9.6 mg/dL (ref 8.6–10.3)
CHLORIDE: 88 mmol/L — AB (ref 98–110)
CO2: 34 mmol/L — AB (ref 20–31)
CREATININE: 1.17 mg/dL (ref 0.70–1.25)
Glucose, Bld: 77 mg/dL (ref 65–99)
POTASSIUM: 3.5 mmol/L (ref 3.5–5.3)
SODIUM: 135 mmol/L (ref 135–146)
TOTAL PROTEIN: 7 g/dL (ref 6.1–8.1)
Total Bilirubin: 0.2 mg/dL (ref 0.2–1.2)

## 2015-07-14 LAB — LIPID PANEL
CHOLESTEROL: 99 mg/dL — AB (ref 125–200)
HDL: 21 mg/dL — AB (ref >=40)
LDL CALC: 47 mg/dL (ref <130)
Total CHOL/HDL Ratio: 4.7 Ratio (ref <=5.0)
Triglycerides: 156 mg/dL — ABNORMAL HIGH (ref <150)
VLDL: 31 mg/dL — ABNORMAL HIGH (ref <30)

## 2015-07-14 MED ORDER — LOSARTAN POTASSIUM 25 MG PO TABS: 25.0000 mg | ORAL_TABLET | Freq: Every day | ORAL | Status: DC

## 2015-07-14 NOTE — Progress Notes (Signed)
Cardiology Office Note   Date:  07/14/2015   ID:  [name], DOB 1955/09/17, MRN [id number]  PCP:  [name], MD  Cardiologist:  [name]   Vascular Surgeon:  Dr. Scot Dock   Chief Complaint  Patient presents with  . Follow-up    CAD, CHF     History of Present Illness: [name] is a 59 y.o. male with a hx of PAF, HTN, diabetes, HL, prior stroke, carotid stenosis, COPD, mental retardation. He was evaluated by Dr. Acie Fredrickson in 08/2012 for chest pain. Myoview was low risk. Echocardiogram in 3/14 demonstrated normal LV function with mild diastolic dysfunction. He has a history of diabetic ulcer and underwent right midfoot amputation in 04/2013.  Admitted 10/2014 with acute hypoxic respiratory failure secondary to a/c diastolic CHF, AECOPD and AF with RVR in the setting of sepsis syndrome related to left lower extremity cellulitis from diabetic foot ulcer.  He had elevated troponins possibly related to demand ischemia.  He converted to sinus rhythm with rate control therapy.   Given his multiple comorbid conditions, it was decided to treat him conservatively for his non-STEMI.  In follow-up, continued conservative management was recommended for his recent non-STEMI. However, he was seen Dr. Scot Dock with vascular surgery and noted to have progressive carotid artery stenosis on the left.  He needs carotid endarterectomy.  He was seen for surgical clearance and he did complain of occasional atypical sounding chest pain.  Myoview was arranged for risk stratification.  This demonstrated inferior, apical, inferolateral ischemia. Study was felt to be high risk.   LHC 04/30/15 demonstrated 3v CAD.  CABG has been recommended.  He returns for FU.  Since his LHC, he is doing well. He had one episode of chest pain that resolved quickly.  He denies significant changes in his dyspnea.  No orthopnea, PND. He has chronic LE edema without change.  No syncope.       Studies/Reports Reviewed Today:  LHC 04/30/15 LAD: Ostial and mid 95%, distal 40%, ostial D1 100%, ostial D2 85% RI: Ostial 65% LCx: Proximal to mid 70%, mid 65%, OM2 90% RCA: Ostial 80%, proximal to distal 100%, distal 100%, RPDA 85%, RPAVB 100% EF 35-45%     Myoview 04/23/15  No T wave inversion was noted during stress.  There was no ST segment deviation noted during stress.  Defect 1: There is a large defect of severe severity.  Findings consistent with ischemia.  This is a high risk study. Large, mostly reversible inferior, apical and inferolateral reversible perfusion defect suggestive of ischemia (SDS 10, Extent 37%). This study was not-gated due to atrial fibrillation. This is a high-risk study based on the size and severity of the perfusion defect.  Carotid US 8/16 R 60-79% L 80-99%  Echo 12/18/14 Mild LVH, EF 55%, wall motion abnormalities could not be excluded, MAC, trivial MR, mild LAE  Echo 10/16/12 Mild LVH, vigorous LVF, EF 65-70%, normal wall motion, grade 1 diastolic dysfunction  Nuclear study 09/06/12 Myoview scan with small apical defect Seen in horizontal images only. Cannot exclude tiny region of apical ischemia vs shift soft tissue. Otherwise normal perfusion. Overall low risk scan.  LV Ejection Fraction: 69%. LV Wall Motion: NL LV Function; NL Wall Motion  Dec. 7, 2016: [name] is seen today for follow up. Was turned down for CABG by Dr. Cyndia Bent     Past Medical History  Diagnosis Date  . Hypertension   . Mental retardation  Sister helps to take care of him and takes him to appts  . Tobacco user     Smokes 1ppd for multiple years.  Quit after hosp 09/2010.  Marland Kitchen Anemia     History of Iron Def Anemia  . Anxiety   . Hyperlipidemia   . PAF (paroxysmal atrial fibrillation) ([place]) 06/2009    CHADS score 2 (HTN, DM), was not on coumadin, but now on Pradaxa for Afib  . Lung nodule   . Chronic low back pain   . CVA (cerebral infarction)  09/2010    Bilateral with Left > Right  . Abdominal hernia     Chronic, not a good surgical candidate  . Carotid artery occlusion   . Obesity   . COPD (chronic obstructive pulmonary disease) ([place])   . Abscess, abdomen ([place]) 12/31/2010    Referred to Wound Care in 01/2011 because of multiple abd abscess with VERY large ventral hernia (please look at image of CT abd/pelvis 09/2010).  Because of hernia I was hesitant to I&D.      Marland Kitchen GERD (gastroesophageal reflux disease)   . Pneumonia     hx of  . Chronic diastolic CHF (congestive heart failure) (H[facility])     takes Lasix  . Itching     all over body; pt scratches and has sores on bilateral arms and abdomen  . Hx of echocardiogram     Echo 5/16:  Mild LVH, EF 55%, indeterm. diast function, WMA could not be ruled out, MAC, trivial MR, mild LAE, normal RVF  . Myocardial infarction [facility]) 2016 ?    Heart attack  (  Per  pt. )  . History of nuclear stress test     Myoview 9/16:  Inferior, apical and inf-lateral ischemia; not gated; High Risk  . Shortness of breath dyspnea     with exertion  . Diabetes mellitus   . Stroke ([place])   . Oxygen dependent     wears 2 liters at bedtime and when needed    Past Surgical History  Procedure Laterality Date  . Carotid arteriogram  10/2010    30% right ICA stenosis, 40% left ICA stenosis   . Transesophageal echocardiogram  09/2010    No ASD or PFO. EF 60-65%.  Normal systolic function. No evidence of thrombus.   . Transthoracic echocardiogram  09/2010     The cavity size was normal. Systolic function was vigorous.  EF 65-70%.  Normal wall funciton.   . Debridment of decubitus ulcer Right 02/13/2013  . I&d extremity Right 02/13/2013    Procedure: IRRIGATION AND DEBRIDEMENT FOOT ULCER;  Surgeon: Johnny Bridge, MD;  Location: [facility];  Service: Orthopedics;  Laterality: Right;  PULSE LAVAGE  . Cataract extraction, bilateral    . Arm surgery Left     as a child  . Amputation Right 03/29/2013    Procedure:  AMPUTATION RAY;  Surgeon: Newt Minion, MD;  Location: [facility];  Service: Orthopedics;  Laterality: Right;  Right Foot 3rd and Possible 4th Ray Amputation  . Multiple tooth extractions    . Amputation Right 04/23/2013    Procedure: AMPUTATION RIGHT MID-FOOT;  Surgeon: Newt Minion, MD;  Location: [facility];  Service: Orthopedics;  Laterality: Right;  . Esophagogastroduodenoscopy (egd) with propofol N/A 06/10/2014    Procedure: ESOPHAGOGASTRODUODENOSCOPY (EGD) WITH PROPOFOL;  Surgeon: Jerene Bears, MD;  Location: [facility];  Service: Gastroenterology;  Laterality: N/A;  . Colonoscopy with propofol N/A 06/10/2014    Procedure:  COLONOSCOPY WITH PROPOFOL;  Surgeon: Jerene Bears, MD;  Location: [facility];  Service: Gastroenterology;  Laterality: N/A;  . Givens capsule study N/A 07/09/2014    Procedure: GIVENS CAPSULE STUDY;  Surgeon: Jerene Bears, MD;  Location: [facility];  Service: Gastroenterology;  Laterality: N/A;  . Cardiac catheterization N/A 04/30/2015    Procedure: Left Heart Cath and Coronary Angiography;  Surgeon: Belva Crome, MD;  Location: [facility];  Service: Cardiovascular;  Laterality: N/A;     Current Outpatient Prescriptions  Medication Sig Dispense Refill  . aspirin EC 81 MG EC tablet Take 1 tablet (81 mg total) by mouth daily. 90 tablet 3  . atorvastatin (LIPITOR) 80 MG tablet Take 1 tablet (80 mg total) by mouth daily at 6 PM. 45 tablet 0  . Cholecalciferol (VITAMIN D3) 1000 UNITS CAPS Take 1,000 Units by mouth daily.    . clonazePAM (KLONOPIN) 0.5 MG tablet Take 1 tablet (0.5 mg total) by mouth at bedtime. 30 tablet 0  . dabigatran (PRADAXA) 150 MG CAPS capsule Take 1 capsule (150 mg total) by mouth every 12 (twelve) hours. 60 capsule 11  . ferrous gluconate (FERGON) 324 MG tablet Take 1 tablet (324 mg total) by mouth 2 (two) times daily with a meal. 60 tablet 0  . furosemide (LASIX) 80 MG tablet Take 1 tablet (80 mg total) by mouth 2 (two) times daily. 60 tablet 0   . gabapentin (NEURONTIN) 300 MG capsule Take 1 capsule (300 mg total) by mouth 2 (two) times daily. 60 capsule 0  . HYDROcodone-acetaminophen (NORCO/VICODIN) 5-325 MG per tablet 1 by mouth once daily as needed for pain control 30 tablet 0  . hydrocortisone 2.5 % ointment Apply topically 3 (three) times daily. To leg around wound. Do not apply to broken skin. 30 g 0  . insulin glargine (LANTUS) 100 UNIT/ML injection Inject 60 Units into the skin at bedtime.     . isosorbide mononitrate (IMDUR) 30 MG 24 hr tablet Take 30 mg by mouth every morning.    . magnesium oxide (MAG-OX) 400 MG tablet Take 400 mg by mouth daily.    . metFORMIN (GLUCOPHAGE) 1000 MG tablet Take 1,000 mg by mouth 2 (two) times daily with a meal.    . metoprolol succinate (TOPROL-XL) 50 MG 24 hr tablet Take 1 tablet (50 mg total) by mouth 2 (two) times daily. Take with or immediately following a meal.    . nitroGLYCERIN (NITROSTAT) 0.4 MG SL tablet Place 0.4 mg under the tongue every 5 (five) minutes as needed for chest pain.     Marland Kitchen omeprazole (PRILOSEC) 20 MG capsule Take 20 mg by mouth daily.    Marland Kitchen PARoxetine (PAXIL) 40 MG tablet Take 40 mg by mouth every morning.    . saccharomyces boulardii (FLORASTOR) 250 MG capsule Take 1 capsule (250 mg total) by mouth 2 (two) times daily. 60 capsule 2   No current facility-administered medications for this visit.    Allergies:   Penicillins    Social History:  The patient  reports that he quit smoking about 4 years ago. His smoking use included Cigarettes. He has a 42 pack-year smoking history. He quit smokeless tobacco use about 4 years ago. He reports that he drinks alcohol. He reports that he does not use illicit drugs.   Family History:  The patient's family history includes Breast cancer in his maternal grandmother; Heart attack in his maternal grandmother; Heart disease in his brother, father, maternal grandmother, and  mother; Hypertension in his sister; Peripheral vascular disease  in his father; Stomach cancer in his maternal uncle. There is no history of Colon cancer or Stroke.    ROS:   Please see the history of present illness.   Review of Systems  All other systems reviewed and are negative.    PHYSICAL EXAM: VS:  BP 120/68 mmHg  Pulse 70  Ht 5' (1.524 m)  Wt 206 lb (93.441 kg)  BMI 40.23 kg/m2    Wt Readings from Last 3 Encounters:  07/14/15 206 lb (93.441 kg)  06/16/15 210 lb (95.255 kg)  06/15/15 209 lb (94.802 kg)     GEN: WN/WD male, in no acute distress HEENT: normal Neck: I cannot assess JVD, no masses Cardiac:  Normal S1/S2, RRR; no murmur ,  no rubs or gallops, trace bilateral LE edema Respiratory:  Decreased breath sounds bilaterally, no wheezing. GI: Large ventral hernia noted, soft, + BS MS: no deformity or atrophy Skin: warm and dry  Neuro:  CNs II-XII intact, Strength and sensation are intact Psych: Normal affect   EKG:  EKG is no ordered today.  It demonstrates:   n/a   Recent Labs: 11/01/2014: Magnesium 2.5 12/15/2014: Pro B Natriuretic peptide (BNP) 290.0* 05/15/2015: ALT 13* 05/19/2015: B Natriuretic Peptide 50.5; BUN 25*; Creatinine, Ser 1.26*; Hemoglobin 12.2*; Platelets 242; Potassium 3.6; Sodium 131*    Lipid Panel    Component Value Date/Time   CHOL 124 11/10/2012 0255   TRIG 112 11/10/2012 0255   HDL 24* 11/10/2012 0255   CHOLHDL 5.2 11/10/2012 0255   VLDL 22 11/10/2012 0255   LDLCALC 78 11/10/2012 0255   LDLDIRECT 54 08/23/2012 1447     ASSESSMENT AND PLAN:  1. CAD:    History of non-STEMI in 10/2014 while hospitalized for acute, multiple, medical illnesses.  Recent high risk Myoview with a large area of inferior ischemia.  [place] 9/22 demonstrated severe 3v CAD and reduced EF 35-45%.  He has already seen Dr. Cyndia Bent and was once again turned down for surgery because of his poorly controlled diabetes mellitus. The plan is to continue with medical therapy at this point.  Will check BMP and HbA1C today  He will  see Dr. Cyndia Bent in follow up to determine if he is a suitable candidate for this major surgical procedure.  I agree that he is a poor candidate unless he can properly control his DM.   Will have him see Richardson Dopp in 6 months and return to see me again in 1 year.   .  Continue ASA, Lipitor, beta-blocker, nitrates.  2. Chronic combined systolic and diastolic CHF (congestive heart failure):  Volume stable.  Continue current regimen.   3. Ischemic Cardiomyopathy:     Will start Losartan 25 mg a day .  4. Paroxysmal atrial fibrillation:  Maintaining NSR. He remains on Pradaxa for anticoagulation. He has a hx of CVA.    Question if Maze will be considered at time of surgery.    5. Essential hypertension:  Controlled.  6. HYPERCHOLESTEROLEMIA:  Continue statin.    7. Carotid stenosis, bilateral:  L CEA pending.       Medication Adjustments: Current medicines are reviewed at length with the patient today.  Concerns regarding medicines are as outlined above.  The following changes have been made:   Discontinued Medications   No medications on file   Modified Medications   No medications on file   New Prescriptions   No medications on file  Labs/ tests ordered today include:  No orders of the defined types were placed in this encounter.

## 2015-07-14 NOTE — Patient Instructions (Signed)
Medication Instructions:  START Losartan 25 mg once daily   Labwork: TODAY - cholesterol, liver, basic metabolic panel, 123456  Your physician recommends that you return for lab work in: 3 weeks for basic metabolic panel   Testing/Procedures: None Ordered   Follow-Up: Your physician recommends that you schedule a follow-up appointment in: 3 months with Richardson Dopp, PA   If you need a refill on your cardiac medications before your next appointment, please call your pharmacy.   Thank you for choosing [facility]! Christen Bame, RN [phone]

## 2015-07-15 ENCOUNTER — Encounter: Payer: Self-pay | Admitting: Surgery

## 2015-07-15 ENCOUNTER — Other Ambulatory Visit: Payer: Self-pay

## 2015-07-15 ENCOUNTER — Other Ambulatory Visit: Payer: Self-pay | Admitting: *Deleted

## 2015-07-15 ENCOUNTER — Encounter: Payer: Self-pay | Admitting: Vascular Surgery

## 2015-07-15 ENCOUNTER — Ambulatory Visit (INDEPENDENT_AMBULATORY_CARE_PROVIDER_SITE_OTHER): Payer: Medicare (Managed Care) | Admitting: Surgery

## 2015-07-15 VITALS — BP 111/59 | HR 65 | Resp 20 | Ht 60.0 in | Wt 206.0 lb

## 2015-07-15 DIAGNOSIS — I2510 Atherosclerotic heart disease of native coronary artery without angina pectoris: Secondary | ICD-10-CM

## 2015-07-15 DIAGNOSIS — I6522 Occlusion and stenosis of left carotid artery: Secondary | ICD-10-CM

## 2015-07-15 DIAGNOSIS — I779 Disorder of arteries and arterioles, unspecified: Secondary | ICD-10-CM

## 2015-07-15 DIAGNOSIS — I739 Peripheral vascular disease, unspecified: Secondary | ICD-10-CM

## 2015-07-15 LAB — HEMOGLOBIN A1C
HEMOGLOBIN A1C: 6.8 % — AB (ref <5.7)
MEAN PLASMA GLUCOSE: 148 mg/dL — AB (ref <117)

## 2015-07-17 ENCOUNTER — Ambulatory Visit (INDEPENDENT_AMBULATORY_CARE_PROVIDER_SITE_OTHER): Payer: Medicare (Managed Care) | Admitting: Vascular Surgery

## 2015-07-17 ENCOUNTER — Encounter: Payer: Self-pay | Admitting: Surgery

## 2015-07-17 ENCOUNTER — Encounter: Payer: Self-pay | Admitting: Vascular Surgery

## 2015-07-17 ENCOUNTER — Telehealth: Payer: Self-pay

## 2015-07-17 VITALS — BP 130/74 | HR 66 | Temp 97.6°F | Resp 18 | Ht 60.0 in | Wt 208.0 lb

## 2015-07-17 DIAGNOSIS — I6522 Occlusion and stenosis of left carotid artery: Secondary | ICD-10-CM | POA: Diagnosis not present

## 2015-07-17 NOTE — Progress Notes (Signed)
HPI:  The patient returns today for follow up after cancellation of CABG and left CEA on 05/19/2015 when his preop Hgb A1c was 10.7 and his glucose on the morning of surgery was 450 and a repeat over 500. He says he has been watching his diet and checking his glucose and most have been less than 100. I have seen him back a couple times and his Hgb A1c has been decreasing. His most recent level was 6.8 on 07/14/2015. He has not been short of breath or having chest pain unless he really exerts himself.  Current Outpatient Prescriptions  Medication Sig Dispense Refill  . aspirin EC 81 MG EC tablet Take 1 tablet (81 mg total) by mouth daily. 90 tablet 3  . atorvastatin (LIPITOR) 80 MG tablet Take 1 tablet (80 mg total) by mouth daily at 6 PM. 45 tablet 0  . Cholecalciferol (VITAMIN D3) 1000 UNITS CAPS Take 1,000 Units by mouth daily.    . clonazePAM (KLONOPIN) 0.5 MG tablet Take 1 tablet (0.5 mg total) by mouth at bedtime. 30 tablet 0  . dabigatran (PRADAXA) 150 MG CAPS capsule Take 1 capsule (150 mg total) by mouth every 12 (twelve) hours. 60 capsule 11  . ferrous gluconate (FERGON) 324 MG tablet Take 1 tablet (324 mg total) by mouth 2 (two) times daily with a meal. 60 tablet 0  . furosemide (LASIX) 80 MG tablet Take 1 tablet (80 mg total) by mouth 2 (two) times daily. 60 tablet 0  . gabapentin (NEURONTIN) 300 MG capsule Take 1 capsule (300 mg total) by mouth 2 (two) times daily. 60 capsule 0  . HYDROcodone-acetaminophen (NORCO/VICODIN) 5-325 MG per tablet 1 by mouth once daily as needed for pain control 30 tablet 0  . hydrocortisone 2.5 % ointment Apply topically 3 (three) times daily. To leg around wound. Do not apply to broken skin. 30 g 0  . insulin glargine (LANTUS) 100 UNIT/ML injection Inject 60 Units into the skin at bedtime.     . isosorbide mononitrate (IMDUR) 30 MG 24 hr tablet Take 30 mg by mouth every morning.    Marland Kitchen losartan (COZAAR) 25 MG tablet Take 1 tablet (25 mg total) by mouth  daily. 31 tablet 11  . magnesium oxide (MAG-OX) 400 MG tablet Take 400 mg by mouth daily.    . metFORMIN (GLUCOPHAGE) 1000 MG tablet Take 1,000 mg by mouth 2 (two) times daily with a meal.    . metoprolol succinate (TOPROL-XL) 50 MG 24 hr tablet Take 1 tablet (50 mg total) by mouth 2 (two) times daily. Take with or immediately following a meal.    . nitroGLYCERIN (NITROSTAT) 0.4 MG SL tablet Place 0.4 mg under the tongue every 5 (five) minutes as needed for chest pain.     Marland Kitchen omeprazole (PRILOSEC) 20 MG capsule Take 20 mg by mouth daily.    Marland Kitchen PARoxetine (PAXIL) 40 MG tablet Take 40 mg by mouth every morning.    . saccharomyces boulardii (FLORASTOR) 250 MG capsule Take 1 capsule (250 mg total) by mouth 2 (two) times daily. 60 capsule 2   No current facility-administered medications for this visit.     Physical Exam: BP 111/59 mmHg  Pulse 65  Resp 20  Ht 5' (1.524 m)  Wt 206 lb (93.441 kg)  BMI 40.23 kg/m2  SpO2 93% He is in not distress Lungs are clear Cardiac exam shows a regular rate and rhythm with normal heart sounds. There is mild bilateral lower  extremity edema  Diagnostic Tests:  None today  Impression:  His diabetic control seems much better now and his Hgb A1c is down to 6.8. I have reviewed his nuclear scan and cath. He has severe 3 vessel CAD with diffusely calcified arteries and a large reversible defect on nuclear scan. He is only 59 years old so I think we should proceed with CABG and Left CEA. I discussed the operative procedure with the patient including alternatives, benefits and risks; including but not limited to bleeding, blood transfusion, infection, stroke, myocardial infarction, graft failure, heart block requiring a permanent pacemaker, organ dysfunction, and death.  [name] understands and agrees to proceed. He has a history of mental retardation documented in his chart but he seems fairly sharp and articulate and I think he understands the need for  surgery and the risks involved.     Plan:  We will coordinate surgery with Dr. Scot Dock in the first week in January. He has a follow up appt with Dr. Scot Dock on 12/9.   [name], MD [facility] [phone]

## 2015-07-17 NOTE — Telephone Encounter (Signed)
SPOKE WITH PT ABOUT LAB RESULTS. PT VERBALIZED UNDERSTANDING.

## 2015-07-17 NOTE — Patient Instructions (Signed)
Carotid Endarterectomy   A carotid endarterectomy is a surgery to remove a blockage in the carotid arteries. The carotid arteries are the large blood vessels on both sides of the neck that supply blood to the brain. Carotid artery disease, also called carotid artery stenosis, is the narrowing or blockage of one or both carotid arteries. Carotid artery disease is usually caused by atherosclerosis, which is a buildup of fat and plaque in the arteries. Some plaque buildup normally occurs with aging. The plaque may partially or totally block blood flow or cause a clot to form in the carotid arteries.   LET YOUR HEALTH CARE PROVIDER KNOW ABOUT:  · Any allergies you have.    · All medicines you are taking, including vitamins, herbs, eye drops, creams, and over-the-counter medicines.    · Use of steroids (by mouth or creams).    · Previous problems you or members of your family have had with the use of anesthetics.    · Any blood disorders you have.    · Previous surgeries you have had.    · Medical conditions you have, including diabetes and kidney problems.    · Possibility of pregnancy, if this applies.    RISKS AND COMPLICATIONS  Generally, carotid endarterectomy is a safe procedure. However, problems can occur and include:  · Bleeding.    · Infection.    · Transient ischemic attacks (TIAs). A TIA results from poor blood flow to the brain, but there is no permanent loss of brain function.    · Stroke.    · Heart attack (myocardial infarction).    · High blood pressure (hypertension).    · Injury to nerves near the carotid arteries.    BEFORE THE PROCEDURE    · Ask your health care provider about changing or stopping your regular medicines. This is especially important if you are taking diabetes medicines or blood thinners.  · Do not eat or drink anything after midnight on the night before the procedure or as directed by your health care provider. Ask your health care provider if it is okay to take any needed medicines  with a sip of water.    · Do not smoke for as long as possible before the surgery. Smoking will increase the chance of a healing problem after surgery.    · You may need to have blood tests, a test to check heart rhythm (electrocardiography), or a test to check blood flow (angiography) done before the surgery.    PROCEDURE   · You will be given a medicine that makes you fall asleep (general anesthetic).  · After you receive the anesthetic, the surgeon will make a small cut (incision) in your neck to expose the carotid artery.  · A tube may be inserted into the carotid artery above and below the blockage. This tube will temporarily allow blood to flow around the blockage during the surgery.  · An incision will be made in the carotid artery at the location of the blockage, and the blockage will then be removed. In some cases, a section of the carotid artery may be removed and a graft patch used to repair the artery.  · The carotid artery will then be closed with stitches (sutures).  · If a tube was inserted into the artery to allow blood flow around the blockage during surgery, the tube will be removed. With the tube removal, blood flow to the brain will be restored through the carotid artery.  · The incision in the neck will then be closed with sutures.  AFTER THE PROCEDURE    You may   have some pain or an ache in your neck for a couple weeks. This is normal.      This information is not intended to replace advice given to you by your health care provider. Make sure you discuss any questions you have with your health care provider.     Document Released: 03/27/2013 Document Revised: 08/15/2014 Document Reviewed: 03/27/2013  Elsevier Interactive Patient Education ©2016 [employer]

## 2015-07-17 NOTE — Telephone Encounter (Signed)
-----   Message from Thayer Headings, MD sent at 07/15/2015  8:31 AM EST ----- Cholesterol levels are very low HbA1C is much improved.

## 2015-07-17 NOTE — Progress Notes (Signed)
[facility] of [place]  Patient name: [name] MRN: [id number] DOB: 1956/02/04 Sex: male  REASON FOR VISIT: Patient is scheduled for a left carotid endarterectomy combined with coronary revascularization on 08/13/2015.  HPI: [name] is a 59 y.o. male, who I last saw in the office on 05/06/2015. He has known bilateral carotid stenoses and the left carotid stenosis had progressed to greater than 80%. He has a 60-79% right carotid stenosis. The patient had been admitted in March with congestive heart failure and had a non-ST MI at that time. He had an abnormal nuclear stress test which prompted cardiac catheterization and he was found to have severe diffuse heavily calcified three-vessel coronary disease. We were considering combined CABG and left carotid endarterectomy. However when he presented for surgery his blood sugar was very poorly controlled and his surgery was canceled. His blood sugar is now under better control and he presents for evaluation prior to proceeding with combined left carotid endarterectomy and CABG.   Since I saw him last,he denies any history of stroke, TIAs, expressive or receptive aphasia, or amaurosis fugax. He does have occasional chest pressure on the right side but this has been stable.  I reviewed Dr. Jeryl Columbia note from his recent visit. Given that his diabetes is under better control with his hemoglobin A1c down to 6.8, the patient appears to be in better condition to proceed with combined left carotid endarterectomy and CABG.  Past Medical History  Diagnosis Date  . Hypertension   . Mental retardation     Sister helps to take care of him and takes him to appts  . Tobacco user     Smokes 1ppd for multiple years.  Quit after hosp 09/2010.  Marland Kitchen Anemia     History of Iron Def Anemia  . Anxiety   . Hyperlipidemia   . PAF (paroxysmal atrial fibrillation) ([place]) 06/2009    CHADS score 2 (HTN, DM), was not on coumadin, but now  on Pradaxa for Afib  . Lung nodule   . Chronic low back pain   . CVA (cerebral infarction) 09/2010    Bilateral with Left > Right  . Abdominal hernia     Chronic, not a good surgical candidate  . Carotid artery occlusion   . Obesity   . COPD (chronic obstructive pulmonary disease) ([place])   . Abscess, abdomen ([place]) 12/31/2010    Referred to Wound Care in 01/2011 because of multiple abd abscess with VERY large ventral hernia (please look at image of CT abd/pelvis 09/2010).  Because of hernia I was hesitant to I&D.      Marland Kitchen GERD (gastroesophageal reflux disease)   . Pneumonia     hx of  . Chronic diastolic CHF (congestive heart failure) (HCC)     takes Lasix  . Itching     all over body; pt scratches and has sores on bilateral arms and abdomen  . Hx of echocardiogram     Echo 5/16:  Mild LVH, EF 55%, indeterm. diast function, WMA could not be ruled out, MAC, trivial MR, mild LAE, normal RVF  . Myocardial infarction [facility]) 2016 ?    Heart attack  (  Per  pt. )  . History of nuclear stress test     Myoview 9/16:  Inferior, apical and inf-lateral ischemia; not gated; High Risk  . Shortness of breath dyspnea     with exertion  . Diabetes mellitus   . Stroke ([place])   .  Oxygen dependent     wears 2 liters at bedtime and when needed    Family History  Problem Relation Age of Onset  . Heart disease Mother   . Hypertension Sister   . Heart disease Brother   . Heart disease Father   . Peripheral vascular disease Father   . Heart disease Maternal Grandmother   . Heart attack Maternal Grandmother   . Breast cancer Maternal Grandmother   . Colon cancer Neg Hx   . Stroke Neg Hx   . Stomach cancer Maternal Uncle     SOCIAL HISTORY: Social History   Social History  . Marital Status: Single    Spouse Name: N/A  . Number of Children: 0  . Years of Education: N/A   Occupational History  .     Social History Main Topics  . Smoking status: Former Smoker -- 1.00 packs/day for 42 years     Types: Cigarettes    Quit date: 11/10/2010  . Smokeless tobacco: Former Systems developer    Quit date: 10/02/2010  . Alcohol Use: 0.0 oz/week    0 Standard drinks or equivalent per week     Comment: rare  . Drug Use: No  . Sexual Activity: Not on file   Other Topics Concern  . Not on file   Social History Narrative   Released from prison in 2003 after serving 2 years for indecency with a minor.    He smokes cigarettes greater than 30 years, also smoked a pipe.  Quit tobacco 09/2010.   Denies EtOH and drugs.     Allergies  Allergen Reactions  . Penicillins Hives and Nausea And Vomiting    Current Outpatient Prescriptions  Medication Sig Dispense Refill  . aspirin EC 81 MG EC tablet Take 1 tablet (81 mg total) by mouth daily. 90 tablet 3  . atorvastatin (LIPITOR) 80 MG tablet Take 1 tablet (80 mg total) by mouth daily at 6 PM. 45 tablet 0  . Cholecalciferol (VITAMIN D3) 1000 UNITS CAPS Take 1,000 Units by mouth daily.    . clonazePAM (KLONOPIN) 0.5 MG tablet Take 1 tablet (0.5 mg total) by mouth at bedtime. 30 tablet 0  . dabigatran (PRADAXA) 150 MG CAPS capsule Take 1 capsule (150 mg total) by mouth every 12 (twelve) hours. 60 capsule 11  . ferrous gluconate (FERGON) 324 MG tablet Take 1 tablet (324 mg total) by mouth 2 (two) times daily with a meal. 60 tablet 0  . furosemide (LASIX) 80 MG tablet Take 1 tablet (80 mg total) by mouth 2 (two) times daily. 60 tablet 0  . gabapentin (NEURONTIN) 300 MG capsule Take 1 capsule (300 mg total) by mouth 2 (two) times daily. 60 capsule 0  . HYDROcodone-acetaminophen (NORCO/VICODIN) 5-325 MG per tablet 1 by mouth once daily as needed for pain control 30 tablet 0  . hydrocortisone 2.5 % ointment Apply topically 3 (three) times daily. To leg around wound. Do not apply to broken skin. 30 g 0  . insulin glargine (LANTUS) 100 UNIT/ML injection Inject 60 Units into the skin at bedtime.     . isosorbide mononitrate (IMDUR) 30 MG 24 hr tablet Take 30 mg by mouth  every morning.    Marland Kitchen losartan (COZAAR) 25 MG tablet Take 1 tablet (25 mg total) by mouth daily. 31 tablet 11  . magnesium oxide (MAG-OX) 400 MG tablet Take 400 mg by mouth daily.    . metFORMIN (GLUCOPHAGE) 1000 MG tablet Take 1,000 mg by mouth 2 (  two) times daily with a meal.    . metoprolol succinate (TOPROL-XL) 50 MG 24 hr tablet Take 1 tablet (50 mg total) by mouth 2 (two) times daily. Take with or immediately following a meal.    . nitroGLYCERIN (NITROSTAT) 0.4 MG SL tablet Place 0.4 mg under the tongue every 5 (five) minutes as needed for chest pain.     Marland Kitchen omeprazole (PRILOSEC) 20 MG capsule Take 20 mg by mouth daily.    Marland Kitchen PARoxetine (PAXIL) 40 MG tablet Take 40 mg by mouth every morning.    . saccharomyces boulardii (FLORASTOR) 250 MG capsule Take 1 capsule (250 mg total) by mouth 2 (two) times daily. 60 capsule 2   No current facility-administered medications for this visit.    REVIEW OF SYSTEMS:  [X]  denotes positive finding, [ ]  denotes negative finding Cardiac  Comments:  Chest pain or chest pressure: X   Shortness of breath upon exertion: X   Short of breath when lying flat:    Irregular heart rhythm:        Vascular    Pain in calf, thigh, or hip brought on by ambulation:    Pain in feet at night that wakes you up from your sleep:     Blood clot in your veins:    Leg swelling:         Pulmonary    Oxygen at home:    Productive cough:     Wheezing:         Neurologic    Sudden weakness in arms or legs:     Sudden numbness in arms or legs:     Sudden onset of difficulty speaking or slurred speech:    Temporary loss of vision in one eye:     Problems with dizziness:         Gastrointestinal    Blood in stool:     Vomited blood:         Genitourinary    Burning when urinating:     Blood in urine:        Psychiatric    Major depression:         Hematologic    Bleeding problems:    Problems with blood clotting too easily:        Skin    Rashes or ulcers:         Constitutional    Fever or chills:      PHYSICAL EXAM: There were no vitals filed for this visit.  GENERAL: The patient is a well-nourished male, in no acute distress. The vital signs are documented above. CARDIAC: There is a regular rate and rhythm.  VASCULAR: he has a left carotid bruit. PULMONARY: There is good air exchange bilaterally without wheezing or rales. ABDOMEN: Soft and non-tender with normal pitched bowel sounds.  MUSCULOSKELETAL: There are no major deformities or cyanosis. NEUROLOGIC: No focal weakness or paresthesias are detected. SKIN: There are no ulcers or rashes noted. PSYCHIATRIC: The patient has a normal affect.  DATA:   CAROTID DUPLEX: I have again reviewed his carotid duplex scan from 04/08/2015. This shows a greater than 80% left carotid stenosis. Peak systolic velocity is 123456 cm/s with an end-diastolic velocity A999333 cm/s. There is a 60-79% right carotid stenosis.   I did review his follow up duplex scan that was done on 05/15/2015. On the left side in the proximal internal carotid artery peak systolic velocity was 99991111 cm/s with an end-diastolic velocity of 123456 cm/s suggesting  a very tight greater than 90% left carotid stenosis. On the right side there was a 40-59% stenosis in the higher end of that range.  MEDICAL ISSUES:  ASYMPTOMATIC GREATER THAN 80% LEFT CAROTID STENOSIS AND CORONARY ARTERY DISEASE: Given the severity of the left carotid stenosis I agree with the plan to proceed with combined left carotid endarterectomy and coronary revascularization. This has been scheduled for 08/13/2015. I have reviewed the indications for carotid endarterectomy, that is to lower the risk of future stroke. I have also reviewed the potential complications of surgery, including but not limited to: bleeding, stroke (perioperative risk 1-2%), MI, nerve injury of other unpredictable medical problems. All of the patients questions were answered and they are agreeable to  proceed with surgery.     [name] [facility] of [place]: [phone]

## 2015-08-04 ENCOUNTER — Other Ambulatory Visit (INDEPENDENT_AMBULATORY_CARE_PROVIDER_SITE_OTHER): Payer: Medicare (Managed Care) | Admitting: *Deleted

## 2015-08-04 DIAGNOSIS — E7800 Pure hypercholesterolemia, unspecified: Secondary | ICD-10-CM | POA: Diagnosis not present

## 2015-08-04 LAB — BASIC METABOLIC PANEL
BUN: 44 mg/dL — ABNORMAL HIGH (ref 7–25)
CALCIUM: 9.7 mg/dL (ref 8.6–10.3)
CO2: 29 mmol/L (ref 20–31)
CREATININE: 1.46 mg/dL — AB (ref 0.70–1.25)
Chloride: 87 mmol/L — ABNORMAL LOW (ref 98–110)
Glucose, Bld: 133 mg/dL — ABNORMAL HIGH (ref 65–99)
Potassium: 3.8 mmol/L (ref 3.5–5.3)
SODIUM: 129 mmol/L — AB (ref 135–146)

## 2015-08-05 ENCOUNTER — Other Ambulatory Visit: Payer: Self-pay

## 2015-08-05 ENCOUNTER — Encounter: Payer: Self-pay | Admitting: *Deleted

## 2015-08-07 ENCOUNTER — Other Ambulatory Visit: Payer: Self-pay | Admitting: Nurse Practitioner

## 2015-08-11 ENCOUNTER — Encounter (HOSPITAL_COMMUNITY)
Admission: RE | Admit: 2015-08-11 | Discharge: 2015-08-11 | Disposition: A | Discharge status: Home / Self Care | Payer: Medicare (Managed Care) | Source: Ambulatory Visit | Attending: Surgery | Admitting: Surgery

## 2015-08-11 ENCOUNTER — Encounter: Payer: Self-pay | Admitting: Cardiology

## 2015-08-11 ENCOUNTER — Encounter: Payer: Self-pay | Admitting: *Deleted

## 2015-08-11 ENCOUNTER — Encounter (HOSPITAL_COMMUNITY): Payer: Self-pay

## 2015-08-11 ENCOUNTER — Other Ambulatory Visit: Payer: Self-pay | Admitting: *Deleted

## 2015-08-11 VITALS — BP 118/60 | HR 68 | Temp 98.1°F | Resp 20 | Ht 60.0 in | Wt 203.0 lb

## 2015-08-11 DIAGNOSIS — I6522 Occlusion and stenosis of left carotid artery: Secondary | ICD-10-CM | POA: Insufficient documentation

## 2015-08-11 DIAGNOSIS — I2510 Atherosclerotic heart disease of native coronary artery without angina pectoris: Secondary | ICD-10-CM

## 2015-08-11 DIAGNOSIS — Z01812 Encounter for preprocedural laboratory examination: Secondary | ICD-10-CM | POA: Insufficient documentation

## 2015-08-11 DIAGNOSIS — Z01810 Encounter for preprocedural cardiovascular examination: Secondary | ICD-10-CM

## 2015-08-11 HISTORY — DX: Presence of spectacles and contact lenses: Z97.3

## 2015-08-11 LAB — COMPREHENSIVE METABOLIC PANEL
ALT: 11 U/L — AB (ref 17–63)
AST: 25 U/L (ref 15–41)
Albumin: 3.1 g/dL — ABNORMAL LOW (ref 3.5–5.0)
Alkaline Phosphatase: 64 U/L (ref 38–126)
Anion gap: 15 (ref 5–15)
BILIRUBIN TOTAL: 0.2 mg/dL — AB (ref 0.3–1.2)
BUN: 39 mg/dL — AB (ref 6–20)
CALCIUM: 9.1 mg/dL (ref 8.9–10.3)
CHLORIDE: 87 mmol/L — AB (ref 101–111)
CO2: 28 mmol/L (ref 22–32)
CREATININE: 1.32 mg/dL — AB (ref 0.61–1.24)
GFR, EST NON AFRICAN AMERICAN: 57 mL/min — AB (ref >60)
Glucose, Bld: 116 mg/dL — ABNORMAL HIGH (ref 65–99)
Potassium: 3.4 mmol/L — ABNORMAL LOW (ref 3.5–5.1)
Sodium: 130 mmol/L — ABNORMAL LOW (ref 135–145)
TOTAL PROTEIN: 6.7 g/dL (ref 6.5–8.1)

## 2015-08-11 LAB — BLOOD GAS, ARTERIAL
Acid-Base Excess: 2.8 mmol/L — ABNORMAL HIGH (ref 0.0–2.0)
BICARBONATE: 26.5 meq/L — AB (ref 20.0–24.0)
FIO2: 0.21
O2 Saturation: 94.3 %
PCO2 ART: 38.5 mmHg (ref 35.0–45.0)
PH ART: 7.452 — AB (ref 7.350–7.450)
Patient temperature: 98.6
TCO2: 27.7 mmol/L (ref 0–100)
pO2, Arterial: 73.4 mmHg — ABNORMAL LOW (ref 80.0–100.0)

## 2015-08-11 LAB — URINALYSIS, ROUTINE W REFLEX MICROSCOPIC
Bilirubin Urine: NEGATIVE
GLUCOSE, UA: NEGATIVE mg/dL
Ketones, ur: NEGATIVE mg/dL
NITRITE: NEGATIVE
PH: 6.5 (ref 5.0–8.0)
Protein, ur: NEGATIVE mg/dL
SPECIFIC GRAVITY, URINE: 1.01 (ref 1.005–1.030)

## 2015-08-11 LAB — CBC
HEMATOCRIT: 34.2 % — AB (ref 39.0–52.0)
Hemoglobin: 11.1 g/dL — ABNORMAL LOW (ref 13.0–17.0)
MCH: 26.2 pg (ref 26.0–34.0)
MCHC: 32.5 g/dL (ref 30.0–36.0)
MCV: 80.7 fL (ref 78.0–100.0)
PLATELETS: 295 10*3/uL (ref 150–400)
RBC: 4.24 MIL/uL (ref 4.22–5.81)
RDW: 14.1 % (ref 11.5–15.5)
WBC: 12.6 10*3/uL — ABNORMAL HIGH (ref 4.0–10.5)

## 2015-08-11 LAB — TYPE AND SCREEN
ABO/RH(D): B POS
Antibody Screen: NEGATIVE

## 2015-08-11 LAB — APTT: APTT: 38 s — AB (ref 24–37)

## 2015-08-11 LAB — URINE MICROSCOPIC-ADD ON

## 2015-08-11 LAB — PROTIME-INR
INR: 1.06 (ref 0.00–1.49)
PROTHROMBIN TIME: 14 s (ref 11.6–15.2)

## 2015-08-11 LAB — GLUCOSE, CAPILLARY: GLUCOSE-CAPILLARY: 94 mg/dL (ref 65–99)

## 2015-08-11 LAB — HEMOGLOBIN A1C: HEMOGLOBIN A1C: 5.6

## 2015-08-11 LAB — SURGICAL PCR SCREEN
MRSA, PCR: NEGATIVE
Staphylococcus aureus: POSITIVE — AB

## 2015-08-11 NOTE — Pre-Procedure Instructions (Addendum)
[name]  08/11/2015      [id number] [place], [place] - [name] [place] [address] Phone: [phone] Fax: [phone]    Your procedure is scheduled on Thursday, August 13, 2015  Report to [facility] Admitting at 5:30 A.M.  Call this number if you have problems the morning of surgery:  [phone]   Remember: Bring your Incentive Spirometer ( breathing device) and cardiac education booklet with you on day of admission.   Do not eat food or drink liquids after midnight Wednesday, August 12, 2015  Take these medicines the morning of surgery with A SIP OF WATER : Aspirin, gabapentin (NEURONTIN), isosorbide mononitrate (IMDUR), metoprolol succinate (TOPROL-XL), omeprazole (PRILOSEC), PARoxetine (PAXIL), if needed:Hydrocodone ( Norco/Vicodon ) for pain, nitroGLYCERIN (NITROSTAT) for chest pain  Stop taking dabigatran (PRADAXA), vitamins, fish oil, and herbal medications. Do not take any NSAIDs ie: Ibuprofen, Advil, Naproxen, BC and Goody Powder; stop now.  How to Manage Your Diabetes Before Surgery Why is it important to control my blood sugar before and after surgery?   Improving blood sugar levels before and after surgery helps healing and can limit problems.  A way of improving blood sugar control is eating a healthy diet by:  - Eating less sugar and carbohydrates  - Increasing activity/exercise  - Talk with your doctor about reaching your blood sugar goals  High blood sugars (greater than 180 mg/dL) can raise your risk of infections and slow down your recovery so you will need to focus on controlling your diabetes during the weeks before surgery.  Make sure that the doctor who takes care of your diabetes knows about your planned surgery including the date and location.  How do I manage my blood sugars before surgery?   Check your blood sugar at least 4 times a day, 2 days before  surgery to make sure that they are not too high or low.   Check your blood sugar the morning of your surgery when you wake up and every 2  hours until you get to the Short-Stay unit.  If your blood sugar is less than 70 mg/dL, you will need to treat for low blood sugar by:  Treat a low blood sugar (less than 70 mg/dL) with 1/2 cup of clear juice (cranberry or apple), 4 glucose tablets, OR glucose gel.  Recheck blood sugar in 15 minutes after treatment (to make sure it is greater than 70 mg/dL).  If blood sugar is not greater than 70 mg/dL on re-check, call [phone] for further instructions.   Report your blood sugar to the Short-Stay nurse when you get to Short-Stay.  References:  [facility], 2007 "How to Manage your Diabetes Before and After Surgery".  What do I do about my diabetes medications?   Do not take oral diabetes medicines (pills) the morning of surgery such as metFORMIN (GLUCOPHAGE)    THE NIGHT BEFORE SURGERY, take 48 units of  Glargine (LANTUS) insulin                                                 Do not take any insulin the morning of surgery.  Do not take other diabetes injectables the day of surgery including Byetta, Victoza, Bydureon, and Trulicity.  If  your CBG is greater than 220 mg/dL, you may take 1/2 of your sliding scale (correction) dose of insulin.   Do not wear jewelry, make-up or nail polish.  Do not wear lotions, powders, or perfumes.  You may not wear deodorant.  Do not shave 48 hours prior to surgery.  Men may shave face and neck.  Do not bring valuables to the hospital.  [facility] is not responsible for any belongings or valuables.  Contacts, dentures or bridgework may not be worn into surgery.  Leave your suitcase in the car.  After surgery it may be brought to your room.  For patients admitted to the hospital, discharge time will be determined by your treatment team.  Patients discharged the day of surgery  will not be allowed to drive home.   Name and phone number of your driver:  Special instructions: Shower the night before surgery and the morning of surgery with CHG.  Please read over the following fact sheets that you were given. Pain Booklet, Coughing and Deep Breathing, Blood Transfusion Information, Open Heart Packet, MRSA Information and Surgical Site Infection Prevention

## 2015-08-11 NOTE — Progress Notes (Signed)
Pt denies any acute cardiopulmonary issues. Pt is under the care of Dr. Acie Fredrickson, cardiology. Pt stated that his fasting blood sugar is  " usually around 55-120's." Pt stated that he takes 20 units of Lantus in the AM. Spoke with Thurmond Butts to clarify if it was okay for me to have pt take 10 units on the morning of surgery and Thurmond Butts suggested that pt hold insulin the morning of surgery and she would make Dr. Cyndia Bent aware. Ryan advised that I instruct pt to check blood sugar the morning of procedure. Pt was instructed to check  blood sugar along with DM protocol for BS<70 and >220. Thurmond Butts also advised that it is okay for pt take Aspirin the morning of surgery. Ryan made aware that Pt UA was abnormal. Spoke with pt sister, [name] ( per pt request) to review pt instructions via telephone. Pt stated that his last dose of Pradaxa as on Saturday ( per MD instructions). Pt was able to verbalize that he will take only 48 units of Lantus the night before surgery. Pt chart forwarded to anesthesia for review of history.

## 2015-08-11 NOTE — Progress Notes (Signed)
   08/11/15 1118  OBSTRUCTIVE SLEEP APNEA  Have you ever been diagnosed with sleep apnea through a sleep study? No  Do you snore loudly (loud enough to be heard through closed doors)?  1  Do you often feel tired, fatigued, or sleepy during the daytime (such as falling asleep during driving or talking to someone)? 1  Has anyone observed you stop breathing during your sleep? 0  Do you have, or are you being treated for high blood pressure? 1  BMI more than 35 kg/m2? 1  Age > 50 (1-yes) 1  Neck circumference greater than:Male 16 inches or larger, Male 17inches or larger? 1  Male Gender (Yes=1) 1  Obstructive Sleep Apnea Score 7

## 2015-08-12 LAB — HEMOGLOBIN A1C
Hgb A1c MFr Bld: 5.8 % — ABNORMAL HIGH (ref 4.8–5.6)
Mean Plasma Glucose: 120 mg/dL

## 2015-08-12 MED ORDER — SODIUM CHLORIDE 0.9 % IV SOLN
INTRAVENOUS | Status: AC
  Administered 2015-08-13: 69.8 mL/h via INTRAVENOUS
  Filled 2015-08-12: qty 40

## 2015-08-12 MED ORDER — METOPROLOL TARTRATE 12.5 MG HALF TABLET: 12.5000 mg | ORAL_TABLET | Freq: Once | ORAL | Status: DC

## 2015-08-12 MED ORDER — CHLORHEXIDINE GLUCONATE 0.12 % MT SOLN
15.0000 mL | Freq: Once | OROMUCOSAL | Status: AC
  Administered 2015-08-13: 15 mL via OROMUCOSAL
  Filled 2015-08-12: qty 15

## 2015-08-12 MED ORDER — CHLORHEXIDINE GLUCONATE 4 % EX LIQD: 30.0000 mL | CUTANEOUS | Status: DC

## 2015-08-12 MED ORDER — VANCOMYCIN HCL 10 G IV SOLR
1500.0000 mg | INTRAVENOUS | Status: AC
  Administered 2015-08-13: 1500 mg via INTRAVENOUS
  Filled 2015-08-12: qty 1500

## 2015-08-12 MED ORDER — EPINEPHRINE HCL 1 MG/ML IJ SOLN
0.0000 ug/min | INTRAVENOUS | Status: DC
  Filled 2015-08-12: qty 4

## 2015-08-12 MED ORDER — POTASSIUM CHLORIDE 2 MEQ/ML IV SOLN
80.0000 meq | INTRAVENOUS | Status: DC
  Filled 2015-08-12: qty 40

## 2015-08-12 MED ORDER — PHENYLEPHRINE HCL 10 MG/ML IJ SOLN
30.0000 ug/min | INTRAVENOUS | Status: AC
  Administered 2015-08-13: 10 ug/min via INTRAVENOUS
  Filled 2015-08-12: qty 2

## 2015-08-12 MED ORDER — MAGNESIUM SULFATE 50 % IJ SOLN
40.0000 meq | INTRAMUSCULAR | Status: DC
  Filled 2015-08-12: qty 10

## 2015-08-12 MED ORDER — SODIUM CHLORIDE 0.9 % IV SOLN
INTRAVENOUS | Status: AC
  Administered 2015-08-13: 1 [IU]/h via INTRAVENOUS
  Filled 2015-08-12: qty 2.5

## 2015-08-12 MED ORDER — LEVOFLOXACIN IN D5W 500 MG/100ML IV SOLN
500.0000 mg | INTRAVENOUS | Status: AC
  Administered 2015-08-13: 500 mg via INTRAVENOUS
  Filled 2015-08-12: qty 100

## 2015-08-12 MED ORDER — NITROGLYCERIN IN D5W 200-5 MCG/ML-% IV SOLN
2.0000 ug/min | INTRAVENOUS | Status: AC
  Administered 2015-08-13: 5 ug/min via INTRAVENOUS
  Filled 2015-08-12: qty 250

## 2015-08-12 MED ORDER — PLASMA-LYTE 148 IV SOLN
INTRAVENOUS | Status: AC
  Administered 2015-08-13: 500 mL
  Filled 2015-08-12: qty 2.5

## 2015-08-12 MED ORDER — DEXMEDETOMIDINE HCL IN NACL 400 MCG/100ML IV SOLN
0.1000 ug/kg/h | INTRAVENOUS | Status: AC
  Administered 2015-08-13: .2 ug/kg/h via INTRAVENOUS
  Filled 2015-08-12: qty 100

## 2015-08-12 MED ORDER — DOPAMINE-DEXTROSE 3.2-5 MG/ML-% IV SOLN
0.0000 ug/kg/min | INTRAVENOUS | Status: DC
  Filled 2015-08-12 (×2): qty 250

## 2015-08-12 MED ORDER — SODIUM CHLORIDE 0.9 % IV SOLN
INTRAVENOUS | Status: DC
  Filled 2015-08-12: qty 30

## 2015-08-12 NOTE — Progress Notes (Signed)
Anesthesia follow-up: See my note from 05/18/15 outlining history and recent cardiac studies. Patient was scheduled for CABG (Dr. Cyndia Bent) and left CEA (Dr. Scot Dock) on 05/19/15, but procedures were canceled due to poorly controlled DM2 (450 at [place]). Since then A1c is down to < 7.0. Surgeries have now been rescheduled for 08/13/15.    Per surgeon instructions, he held Pradaxa after 08/08/15 dose.  OSA screening score was 7.   08/11/15 EKG: NSR, right BBB, inferior infarct (age undetermined).   05/15/15 Carotid U/S: Right internal carotid artery 40-59% stenosis with a mild increase in velocities when compared to prior study. Left internal carotid artery 80-99% stenosis. This is a significant increase in velocity when compared to prior study dated 04/08/2015. Left vertebral artery appeared occluded.  08/11/15 CXR: IMPRESSION: Low lung volumes with mild basilar atelectasis.  Preoperative labs noted. Na 130. K 3.4. Cr 1.32. Glucose 116. A1c 5.8. H/H 11.1/34.2. PT/INR WNL. PTT 38. WBC 12.6. UA showed large leukocytes, negative nitrites, few bacteria (result already called to [place]). Defer treatment recommendations, if any, to surgeon.   A1c down from 10.7 since 05/2015. Other labs appear stable. UA already called to [facility]. If no acute changes then I anticipate that he can anticipate from an anesthesia standpoint.  George Hugh [facility] Short Stay Center/Anesthesiology Phone [phone] 08/12/2015 11:00 AM

## 2015-08-13 ENCOUNTER — Inpatient Hospital Stay (HOSPITAL_COMMUNITY)
Admission: RE | Admit: 2015-08-13 | Discharge: 2015-08-21 | DRG: 038 | Disposition: A | Discharge status: Skilled Nursing Facility | Payer: Medicare (Managed Care) | Source: Ambulatory Visit | Attending: Cardiothoracic Surgery | Admitting: Cardiothoracic Surgery

## 2015-08-13 ENCOUNTER — Inpatient Hospital Stay (HOSPITAL_COMMUNITY): Payer: Medicare (Managed Care) | Admitting: Certified Registered Nurse Anesthetist

## 2015-08-13 ENCOUNTER — Encounter (HOSPITAL_COMMUNITY): Payer: Self-pay | Admitting: Surgery

## 2015-08-13 ENCOUNTER — Inpatient Hospital Stay (HOSPITAL_COMMUNITY): Payer: Medicare (Managed Care) | Admitting: Vascular Surgery

## 2015-08-13 ENCOUNTER — Inpatient Hospital Stay (HOSPITAL_COMMUNITY): Payer: Medicare (Managed Care)

## 2015-08-13 ENCOUNTER — Encounter (HOSPITAL_COMMUNITY)
Admission: RE | Disposition: A | Discharge status: Skilled Nursing Facility | Payer: Medicare (Managed Care) | Source: Ambulatory Visit | Attending: Cardiothoracic Surgery

## 2015-08-13 DIAGNOSIS — I5032 Chronic diastolic (congestive) heart failure: Secondary | ICD-10-CM | POA: Diagnosis present

## 2015-08-13 DIAGNOSIS — E669 Obesity, unspecified: Secondary | ICD-10-CM | POA: Diagnosis present

## 2015-08-13 DIAGNOSIS — Z7901 Long term (current) use of anticoagulants: Secondary | ICD-10-CM | POA: Diagnosis not present

## 2015-08-13 DIAGNOSIS — Z9981 Dependence on supplemental oxygen: Secondary | ICD-10-CM | POA: Diagnosis not present

## 2015-08-13 DIAGNOSIS — I480 Paroxysmal atrial fibrillation: Secondary | ICD-10-CM | POA: Diagnosis present

## 2015-08-13 DIAGNOSIS — K219 Gastro-esophageal reflux disease without esophagitis: Secondary | ICD-10-CM | POA: Diagnosis present

## 2015-08-13 DIAGNOSIS — I10 Essential (primary) hypertension: Secondary | ICD-10-CM | POA: Diagnosis present

## 2015-08-13 DIAGNOSIS — Z7982 Long term (current) use of aspirin: Secondary | ICD-10-CM

## 2015-08-13 DIAGNOSIS — I454 Nonspecific intraventricular block: Secondary | ICD-10-CM | POA: Diagnosis present

## 2015-08-13 DIAGNOSIS — J449 Chronic obstructive pulmonary disease, unspecified: Secondary | ICD-10-CM | POA: Diagnosis present

## 2015-08-13 DIAGNOSIS — I2510 Atherosclerotic heart disease of native coronary artery without angina pectoris: Secondary | ICD-10-CM | POA: Diagnosis present

## 2015-08-13 DIAGNOSIS — Z8673 Personal history of transient ischemic attack (TIA), and cerebral infarction without residual deficits: Secondary | ICD-10-CM

## 2015-08-13 DIAGNOSIS — Z794 Long term (current) use of insulin: Secondary | ICD-10-CM

## 2015-08-13 DIAGNOSIS — I6529 Occlusion and stenosis of unspecified carotid artery: Secondary | ICD-10-CM | POA: Diagnosis present

## 2015-08-13 DIAGNOSIS — E119 Type 2 diabetes mellitus without complications: Secondary | ICD-10-CM | POA: Diagnosis present

## 2015-08-13 DIAGNOSIS — Z23 Encounter for immunization: Secondary | ICD-10-CM | POA: Diagnosis not present

## 2015-08-13 DIAGNOSIS — E11622 Type 2 diabetes mellitus with other skin ulcer: Secondary | ICD-10-CM

## 2015-08-13 DIAGNOSIS — I252 Old myocardial infarction: Secondary | ICD-10-CM | POA: Diagnosis not present

## 2015-08-13 DIAGNOSIS — E785 Hyperlipidemia, unspecified: Secondary | ICD-10-CM | POA: Diagnosis present

## 2015-08-13 DIAGNOSIS — D62 Acute posthemorrhagic anemia: Secondary | ICD-10-CM | POA: Diagnosis not present

## 2015-08-13 DIAGNOSIS — J9811 Atelectasis: Secondary | ICD-10-CM | POA: Diagnosis not present

## 2015-08-13 DIAGNOSIS — Z951 Presence of aortocoronary bypass graft: Secondary | ICD-10-CM

## 2015-08-13 DIAGNOSIS — Z87891 Personal history of nicotine dependence: Secondary | ICD-10-CM

## 2015-08-13 DIAGNOSIS — I6523 Occlusion and stenosis of bilateral carotid arteries: Secondary | ICD-10-CM | POA: Diagnosis present

## 2015-08-13 DIAGNOSIS — Z6839 Body mass index (BMI) 39.0-39.9, adult: Secondary | ICD-10-CM | POA: Diagnosis not present

## 2015-08-13 DIAGNOSIS — I6522 Occlusion and stenosis of left carotid artery: Principal | ICD-10-CM | POA: Diagnosis present

## 2015-08-13 DIAGNOSIS — L97909 Non-pressure chronic ulcer of unspecified part of unspecified lower leg with unspecified severity: Secondary | ICD-10-CM

## 2015-08-13 DIAGNOSIS — F79 Unspecified intellectual disabilities: Secondary | ICD-10-CM | POA: Diagnosis present

## 2015-08-13 HISTORY — PX: CORONARY ARTERY BYPASS GRAFT: SHX141

## 2015-08-13 HISTORY — PX: TEE WITHOUT CARDIOVERSION: SHX5443

## 2015-08-13 HISTORY — PX: ENDARTERECTOMY: SHX5162

## 2015-08-13 HISTORY — PX: CAROTID ENDARTERECTOMY: SUR193

## 2015-08-13 LAB — POCT I-STAT, CHEM 8
BUN: 33 mg/dL — AB (ref 6–20)
BUN: 31 mg/dL — AB (ref 6–20)
BUN: 29 mg/dL — AB (ref 6–20)
BUN: 31 mg/dL — AB (ref 6–20)
BUN: 30 mg/dL — ABNORMAL HIGH (ref 6–20)
BUN: 29 mg/dL — ABNORMAL HIGH (ref 6–20)
BUN: 23 mg/dL — AB (ref 6–20)
CALCIUM ION: 1.2 mmol/L (ref 1.13–1.30)
CALCIUM ION: 1.21 mmol/L (ref 1.13–1.30)
CALCIUM ION: 1.05 mmol/L — AB (ref 1.13–1.30)
CALCIUM ION: 1.08 mmol/L — AB (ref 1.13–1.30)
CALCIUM ION: 1.08 mmol/L — AB (ref 1.13–1.30)
CALCIUM ION: 1.2 mmol/L (ref 1.13–1.30)
CHLORIDE: 91 mmol/L — AB (ref 101–111)
CHLORIDE: 90 mmol/L — AB (ref 101–111)
CHLORIDE: 100 mmol/L — AB (ref 101–111)
CREATININE: 0.8 mg/dL (ref 0.61–1.24)
CREATININE: 0.7 mg/dL (ref 0.61–1.24)
CREATININE: 0.9 mg/dL (ref 0.61–1.24)
CREATININE: 0.9 mg/dL (ref 0.61–1.24)
CREATININE: 0.9 mg/dL (ref 0.61–1.24)
Calcium, Ion: 1.25 mmol/L (ref 1.13–1.30)
Chloride: 93 mmol/L — ABNORMAL LOW (ref 101–111)
Chloride: 92 mmol/L — ABNORMAL LOW (ref 101–111)
Chloride: 90 mmol/L — ABNORMAL LOW (ref 101–111)
Chloride: 92 mmol/L — ABNORMAL LOW (ref 101–111)
Creatinine, Ser: 0.8 mg/dL (ref 0.61–1.24)
Creatinine, Ser: 0.7 mg/dL (ref 0.61–1.24)
GLUCOSE: 250 mg/dL — AB (ref 65–99)
GLUCOSE: 155 mg/dL — AB (ref 65–99)
GLUCOSE: 185 mg/dL — AB (ref 65–99)
GLUCOSE: 144 mg/dL — AB (ref 65–99)
GLUCOSE: 138 mg/dL — AB (ref 65–99)
GLUCOSE: 148 mg/dL — AB (ref 65–99)
Glucose, Bld: 214 mg/dL — ABNORMAL HIGH (ref 65–99)
HCT: 31 % — ABNORMAL LOW (ref 39.0–52.0)
HCT: 23 % — ABNORMAL LOW (ref 39.0–52.0)
HCT: 29 % — ABNORMAL LOW (ref 39.0–52.0)
HCT: 25 % — ABNORMAL LOW (ref 39.0–52.0)
HCT: 25 % — ABNORMAL LOW (ref 39.0–52.0)
HCT: 27 % — ABNORMAL LOW (ref 39.0–52.0)
HEMATOCRIT: 32 % — AB (ref 39.0–52.0)
HEMOGLOBIN: 9.9 g/dL — AB (ref 13.0–17.0)
HEMOGLOBIN: 8.5 g/dL — AB (ref 13.0–17.0)
Hemoglobin: 10.5 g/dL — ABNORMAL LOW (ref 13.0–17.0)
Hemoglobin: 10.9 g/dL — ABNORMAL LOW (ref 13.0–17.0)
Hemoglobin: 7.8 g/dL — ABNORMAL LOW (ref 13.0–17.0)
Hemoglobin: 8.5 g/dL — ABNORMAL LOW (ref 13.0–17.0)
Hemoglobin: 9.2 g/dL — ABNORMAL LOW (ref 13.0–17.0)
POTASSIUM: 3.2 mmol/L — AB (ref 3.5–5.1)
POTASSIUM: 4.3 mmol/L (ref 3.5–5.1)
Potassium: 3.4 mmol/L — ABNORMAL LOW (ref 3.5–5.1)
Potassium: 3.2 mmol/L — ABNORMAL LOW (ref 3.5–5.1)
Potassium: 4 mmol/L (ref 3.5–5.1)
Potassium: 4.4 mmol/L (ref 3.5–5.1)
Potassium: 3.5 mmol/L (ref 3.5–5.1)
SODIUM: 136 mmol/L (ref 135–145)
Sodium: 134 mmol/L — ABNORMAL LOW (ref 135–145)
Sodium: 136 mmol/L (ref 135–145)
Sodium: 135 mmol/L (ref 135–145)
Sodium: 137 mmol/L (ref 135–145)
Sodium: 136 mmol/L (ref 135–145)
Sodium: 140 mmol/L (ref 135–145)
TCO2: 35 mmol/L (ref 0–100)
TCO2: 35 mmol/L (ref 0–100)
TCO2: 32 mmol/L (ref 0–100)
TCO2: 33 mmol/L (ref 0–100)
TCO2: 31 mmol/L (ref 0–100)
TCO2: 31 mmol/L (ref 0–100)
TCO2: 29 mmol/L (ref 0–100)

## 2015-08-13 LAB — CREATININE, SERUM: CREATININE: 0.97 mg/dL (ref 0.61–1.24)

## 2015-08-13 LAB — POCT I-STAT 3, ART BLOOD GAS (G3+)
ACID-BASE EXCESS: 10 mmol/L — AB (ref 0.0–2.0)
ACID-BASE EXCESS: 7 mmol/L — AB (ref 0.0–2.0)
BICARBONATE: 30.9 meq/L — AB (ref 20.0–24.0)
Bicarbonate: 33.9 mEq/L — ABNORMAL HIGH (ref 20.0–24.0)
O2 SAT: 100 %
O2 SAT: 98 %
PH ART: 7.509 — AB (ref 7.350–7.450)
PH ART: 7.492 — AB (ref 7.350–7.450)
PO2 ART: 93 mmHg (ref 80.0–100.0)
TCO2: 35 mmol/L (ref 0–100)
TCO2: 32 mmol/L (ref 0–100)
pCO2 arterial: 42.6 mmHg (ref 35.0–45.0)
pCO2 arterial: 40 mmHg (ref 35.0–45.0)
pO2, Arterial: 269 mmHg — ABNORMAL HIGH (ref 80.0–100.0)

## 2015-08-13 LAB — CBC
HEMATOCRIT: 28.7 % — AB (ref 39.0–52.0)
HEMATOCRIT: 28 % — AB (ref 39.0–52.0)
HEMOGLOBIN: 9.3 g/dL — AB (ref 13.0–17.0)
HEMOGLOBIN: 9 g/dL — AB (ref 13.0–17.0)
MCH: 26.1 pg (ref 26.0–34.0)
MCH: 26.1 pg (ref 26.0–34.0)
MCHC: 32.4 g/dL (ref 30.0–36.0)
MCHC: 32.1 g/dL (ref 30.0–36.0)
MCV: 80.6 fL (ref 78.0–100.0)
MCV: 81.2 fL (ref 78.0–100.0)
Platelets: 204 10*3/uL (ref 150–400)
Platelets: 209 10*3/uL (ref 150–400)
RBC: 3.56 MIL/uL — ABNORMAL LOW (ref 4.22–5.81)
RBC: 3.45 MIL/uL — ABNORMAL LOW (ref 4.22–5.81)
RDW: 13.9 % (ref 11.5–15.5)
RDW: 14.1 % (ref 11.5–15.5)
WBC: 12.9 10*3/uL — ABNORMAL HIGH (ref 4.0–10.5)
WBC: 11.2 10*3/uL — AB (ref 4.0–10.5)

## 2015-08-13 LAB — GLUCOSE, CAPILLARY
GLUCOSE-CAPILLARY: 207 mg/dL — AB (ref 65–99)
GLUCOSE-CAPILLARY: 97 mg/dL (ref 65–99)
GLUCOSE-CAPILLARY: 107 mg/dL — AB (ref 65–99)
GLUCOSE-CAPILLARY: 108 mg/dL — AB (ref 65–99)
GLUCOSE-CAPILLARY: 110 mg/dL — AB (ref 65–99)
GLUCOSE-CAPILLARY: 107 mg/dL — AB (ref 65–99)
Glucose-Capillary: 126 mg/dL — ABNORMAL HIGH (ref 65–99)
Glucose-Capillary: 114 mg/dL — ABNORMAL HIGH (ref 65–99)

## 2015-08-13 LAB — POCT I-STAT 4, (NA,K, GLUC, HGB,HCT)
Glucose, Bld: 124 mg/dL — ABNORMAL HIGH (ref 65–99)
HCT: 28 % — ABNORMAL LOW (ref 39.0–52.0)
HEMOGLOBIN: 9.5 g/dL — AB (ref 13.0–17.0)
POTASSIUM: 3.3 mmol/L — AB (ref 3.5–5.1)
Sodium: 139 mmol/L (ref 135–145)

## 2015-08-13 LAB — HEMOGLOBIN AND HEMATOCRIT, BLOOD
HCT: 22.4 % — ABNORMAL LOW (ref 39.0–52.0)
Hemoglobin: 7.5 g/dL — ABNORMAL LOW (ref 13.0–17.0)

## 2015-08-13 LAB — PROTIME-INR
INR: 1.37 (ref 0.00–1.49)
PROTHROMBIN TIME: 17 s — AB (ref 11.6–15.2)

## 2015-08-13 LAB — PLATELET COUNT: Platelets: 164 10*3/uL (ref 150–400)

## 2015-08-13 LAB — APTT: aPTT: 32 seconds (ref 24–37)

## 2015-08-13 LAB — MAGNESIUM: Magnesium: 2.9 mg/dL — ABNORMAL HIGH (ref 1.7–2.4)

## 2015-08-13 SURGERY — CORONARY ARTERY BYPASS GRAFTING (CABG)
Anesthesia: General | Site: Chest

## 2015-08-13 MED ORDER — FENTANYL CITRATE (PF) 100 MCG/2ML IJ SOLN
INTRAMUSCULAR | Status: DC | PRN
Start: 2015-08-13 — End: 2015-08-13
  Administered 2015-08-13: 50 ug via INTRAVENOUS
  Administered 2015-08-13: 100 ug via INTRAVENOUS
  Administered 2015-08-13: 150 ug via INTRAVENOUS
  Administered 2015-08-13: 250 ug via INTRAVENOUS
  Administered 2015-08-13 (×3): 100 ug via INTRAVENOUS
  Administered 2015-08-13: 150 ug via INTRAVENOUS

## 2015-08-13 MED ORDER — OXYCODONE HCL 5 MG PO TABS
5.0000 mg | ORAL_TABLET | ORAL | Status: DC | PRN
  Administered 2015-08-14: 5 mg via ORAL
  Administered 2015-08-15: 10 mg via ORAL
  Administered 2015-08-15 – 2015-08-16 (×3): 5 mg via ORAL
  Filled 2015-08-13 (×3): qty 1
  Filled 2015-08-13: qty 2
  Filled 2015-08-13: qty 1

## 2015-08-13 MED ORDER — LACTATED RINGERS IV SOLN: 500.0000 mL | Freq: Once | INTRAVENOUS | Status: DC | PRN

## 2015-08-13 MED ORDER — METOPROLOL TARTRATE 1 MG/ML IV SOLN: 2.5000 mg | INTRAVENOUS | Status: DC | PRN

## 2015-08-13 MED ORDER — MIDAZOLAM HCL 5 MG/5ML IJ SOLN
INTRAMUSCULAR | Status: DC | PRN
  Administered 2015-08-13: 3 mg via INTRAVENOUS
  Administered 2015-08-13: 2 mg via INTRAVENOUS
  Administered 2015-08-13: 1 mg via INTRAVENOUS

## 2015-08-13 MED ORDER — CLONAZEPAM 0.5 MG PO TABS
0.5000 mg | ORAL_TABLET | Freq: Every day | ORAL | Status: DC
  Administered 2015-08-14 – 2015-08-20 (×7): 0.5 mg via ORAL
  Filled 2015-08-13 (×7): qty 1

## 2015-08-13 MED ORDER — ALBUMIN HUMAN 5 % IV SOLN
INTRAVENOUS | Status: DC | PRN
  Administered 2015-08-13 (×2): via INTRAVENOUS

## 2015-08-13 MED ORDER — LACTATED RINGERS IV SOLN: INTRAVENOUS | Status: DC

## 2015-08-13 MED ORDER — MORPHINE SULFATE (PF) 2 MG/ML IV SOLN: 1.0000 mg | INTRAVENOUS | Status: AC | PRN

## 2015-08-13 MED ORDER — PHENYLEPHRINE HCL 10 MG/ML IJ SOLN
0.0000 ug/min | INTRAVENOUS | Status: DC
  Administered 2015-08-14: 10 ug/min via INTRAVENOUS
  Administered 2015-08-14: 20 ug/min via INTRAVENOUS
  Filled 2015-08-13 (×2): qty 2

## 2015-08-13 MED ORDER — INSULIN REGULAR BOLUS VIA INFUSION
0.0000 [IU] | Freq: Three times a day (TID) | INTRAVENOUS | Status: DC
  Filled 2015-08-13: qty 10

## 2015-08-13 MED ORDER — SODIUM CHLORIDE 0.9 % IV SOLN
INTRAVENOUS | Status: DC | PRN
  Administered 2015-08-13: 500 mL

## 2015-08-13 MED ORDER — PROPOFOL 10 MG/ML IV BOLUS
INTRAVENOUS | Status: AC
  Filled 2015-08-13: qty 20

## 2015-08-13 MED ORDER — DOCUSATE SODIUM 100 MG PO CAPS
200.0000 mg | ORAL_CAPSULE | Freq: Every day | ORAL | Status: DC
  Administered 2015-08-14 – 2015-08-16 (×3): 200 mg via ORAL
  Filled 2015-08-13 (×3): qty 2

## 2015-08-13 MED ORDER — ASPIRIN EC 325 MG PO TBEC
325.0000 mg | DELAYED_RELEASE_TABLET | Freq: Every day | ORAL | Status: DC
  Administered 2015-08-14 – 2015-08-17 (×4): 325 mg via ORAL
  Filled 2015-08-13 (×4): qty 1

## 2015-08-13 MED ORDER — HEPARIN SODIUM (PORCINE) 1000 UNIT/ML IJ SOLN
INTRAMUSCULAR | Status: AC
  Filled 2015-08-13: qty 1

## 2015-08-13 MED ORDER — PROTAMINE SULFATE 10 MG/ML IV SOLN
INTRAVENOUS | Status: AC
  Filled 2015-08-13: qty 25

## 2015-08-13 MED ORDER — DEXTRAN 40 IN SALINE 10-0.9 % IV SOLN
INTRAVENOUS | Status: DC | PRN
  Administered 2015-08-13: 500 mL

## 2015-08-13 MED ORDER — SODIUM CHLORIDE 0.9 % IJ SOLN: 3.0000 mL | INTRAMUSCULAR | Status: DC | PRN

## 2015-08-13 MED ORDER — GABAPENTIN 300 MG PO CAPS
300.0000 mg | ORAL_CAPSULE | Freq: Two times a day (BID) | ORAL | Status: DC
  Administered 2015-08-15 – 2015-08-21 (×13): 300 mg via ORAL
  Filled 2015-08-13 (×9): qty 1
  Filled 2015-08-13: qty 3
  Filled 2015-08-13 (×3): qty 1

## 2015-08-13 MED ORDER — SODIUM CHLORIDE 0.9 % IJ SOLN
INTRAMUSCULAR | Status: AC
  Filled 2015-08-13: qty 10

## 2015-08-13 MED ORDER — SACCHAROMYCES BOULARDII 250 MG PO CAPS
250.0000 mg | ORAL_CAPSULE | Freq: Two times a day (BID) | ORAL | Status: DC
  Administered 2015-08-14 – 2015-08-21 (×15): 250 mg via ORAL
  Filled 2015-08-13 (×14): qty 1

## 2015-08-13 MED ORDER — ANTISEPTIC ORAL RINSE SOLUTION (CORINZ): 7.0000 mL | Freq: Four times a day (QID) | OROMUCOSAL | Status: DC

## 2015-08-13 MED ORDER — FAMOTIDINE IN NACL 20-0.9 MG/50ML-% IV SOLN
20.0000 mg | Freq: Two times a day (BID) | INTRAVENOUS | Status: AC
  Administered 2015-08-13 (×2): 20 mg via INTRAVENOUS
  Filled 2015-08-13: qty 50

## 2015-08-13 MED ORDER — VANCOMYCIN HCL IN DEXTROSE 1-5 GM/200ML-% IV SOLN
1000.0000 mg | Freq: Once | INTRAVENOUS | Status: AC
  Administered 2015-08-13: 1000 mg via INTRAVENOUS
  Filled 2015-08-13: qty 200

## 2015-08-13 MED ORDER — LIDOCAINE-EPINEPHRINE (PF) 1 %-1:200000 IJ SOLN
INTRAMUSCULAR | Status: AC
  Filled 2015-08-13: qty 30

## 2015-08-13 MED ORDER — SODIUM CHLORIDE 0.9 % IJ SOLN: 10.0000 mL | INTRAMUSCULAR | Status: DC | PRN

## 2015-08-13 MED ORDER — 0.9 % SODIUM CHLORIDE (POUR BTL) OPTIME
TOPICAL | Status: DC | PRN
  Administered 2015-08-13: 6000 mL

## 2015-08-13 MED ORDER — MORPHINE SULFATE (PF) 2 MG/ML IV SOLN: 2.0000 mg | INTRAVENOUS | Status: DC | PRN

## 2015-08-13 MED ORDER — SODIUM CHLORIDE 0.9 % IV SOLN
250.0000 mL | INTRAVENOUS | Status: DC
  Administered 2015-08-14: 250 mL via INTRAVENOUS

## 2015-08-13 MED ORDER — HEPARIN SODIUM (PORCINE) 1000 UNIT/ML IJ SOLN
INTRAMUSCULAR | Status: DC | PRN
  Administered 2015-08-13: 9000 [IU] via INTRAVENOUS

## 2015-08-13 MED ORDER — BISACODYL 10 MG RE SUPP: 10.0000 mg | Freq: Every day | RECTAL | Status: DC

## 2015-08-13 MED ORDER — DEXMEDETOMIDINE HCL IN NACL 200 MCG/50ML IV SOLN
0.0000 ug/kg/h | INTRAVENOUS | Status: DC
  Administered 2015-08-13: 0.7 ug/kg/h via INTRAVENOUS
  Filled 2015-08-13: qty 50

## 2015-08-13 MED ORDER — ANTISEPTIC ORAL RINSE SOLUTION (CORINZ)
7.0000 mL | OROMUCOSAL | Status: DC
  Administered 2015-08-13 – 2015-08-14 (×10): 7 mL via OROMUCOSAL

## 2015-08-13 MED ORDER — FERROUS GLUCONATE 324 (38 FE) MG PO TABS
324.0000 mg | ORAL_TABLET | Freq: Every day | ORAL | Status: DC
  Administered 2015-08-15 – 2015-08-21 (×7): 324 mg via ORAL
  Filled 2015-08-13 (×9): qty 1

## 2015-08-13 MED ORDER — BISACODYL 5 MG PO TBEC
10.0000 mg | DELAYED_RELEASE_TABLET | Freq: Every day | ORAL | Status: DC
  Administered 2015-08-14 – 2015-08-16 (×3): 10 mg via ORAL
  Filled 2015-08-13 (×3): qty 2

## 2015-08-13 MED ORDER — DEXTRAN 40 IN SALINE 10-0.9 % IV SOLN
INTRAVENOUS | Status: AC
  Filled 2015-08-13: qty 500

## 2015-08-13 MED ORDER — NITROGLYCERIN IN D5W 200-5 MCG/ML-% IV SOLN: 0.0000 ug/min | INTRAVENOUS | Status: DC

## 2015-08-13 MED ORDER — LACTATED RINGERS IV SOLN
INTRAVENOUS | Status: DC | PRN
  Administered 2015-08-13: 07:00:00 via INTRAVENOUS

## 2015-08-13 MED ORDER — CHLORHEXIDINE GLUCONATE 0.12 % MT SOLN
15.0000 mL | OROMUCOSAL | Status: AC
  Administered 2015-08-13: 15 mL via OROMUCOSAL

## 2015-08-13 MED ORDER — PAROXETINE HCL 20 MG PO TABS
40.0000 mg | ORAL_TABLET | Freq: Every day | ORAL | Status: DC
  Administered 2015-08-14 – 2015-08-21 (×8): 40 mg via ORAL
  Filled 2015-08-13 (×8): qty 2

## 2015-08-13 MED ORDER — PROTAMINE SULFATE 10 MG/ML IV SOLN
INTRAVENOUS | Status: AC
  Filled 2015-08-13: qty 5

## 2015-08-13 MED ORDER — MAGNESIUM SULFATE 4 GM/100ML IV SOLN
4.0000 g | Freq: Once | INTRAVENOUS | Status: AC
  Administered 2015-08-13: 4 g via INTRAVENOUS
  Filled 2015-08-13: qty 100

## 2015-08-13 MED ORDER — MUPIROCIN 2 % EX OINT
1.0000 "application " | TOPICAL_OINTMENT | Freq: Two times a day (BID) | CUTANEOUS | Status: AC
  Administered 2015-08-13 – 2015-08-18 (×10): 1 via NASAL
  Filled 2015-08-13 (×3): qty 22

## 2015-08-13 MED ORDER — METOPROLOL TARTRATE 25 MG/10 ML ORAL SUSPENSION: 12.5000 mg | Freq: Two times a day (BID) | ORAL | Status: DC

## 2015-08-13 MED ORDER — MIDAZOLAM HCL 10 MG/2ML IJ SOLN
INTRAMUSCULAR | Status: AC
  Filled 2015-08-13: qty 2

## 2015-08-13 MED ORDER — ONDANSETRON HCL 4 MG/2ML IJ SOLN: 4.0000 mg | Freq: Four times a day (QID) | INTRAMUSCULAR | Status: DC | PRN

## 2015-08-13 MED ORDER — PANTOPRAZOLE SODIUM 40 MG PO TBEC
40.0000 mg | DELAYED_RELEASE_TABLET | Freq: Every day | ORAL | Status: DC
  Administered 2015-08-14 – 2015-08-17 (×4): 40 mg via ORAL
  Filled 2015-08-13 (×4): qty 1

## 2015-08-13 MED ORDER — LEVOFLOXACIN IN D5W 750 MG/150ML IV SOLN
750.0000 mg | INTRAVENOUS | Status: AC
  Administered 2015-08-14: 750 mg via INTRAVENOUS
  Filled 2015-08-13: qty 150

## 2015-08-13 MED ORDER — ALBUMIN HUMAN 5 % IV SOLN
250.0000 mL | INTRAVENOUS | Status: AC | PRN
  Administered 2015-08-13 – 2015-08-14 (×4): 250 mL via INTRAVENOUS
  Filled 2015-08-13 (×2): qty 250

## 2015-08-13 MED ORDER — TRAMADOL HCL 50 MG PO TABS
50.0000 mg | ORAL_TABLET | ORAL | Status: DC | PRN
  Administered 2015-08-15 – 2015-08-17 (×3): 100 mg via ORAL
  Filled 2015-08-13 (×3): qty 2

## 2015-08-13 MED ORDER — CHLORHEXIDINE GLUCONATE CLOTH 2 % EX PADS: 6.0000 | MEDICATED_PAD | Freq: Every day | CUTANEOUS | Status: DC

## 2015-08-13 MED ORDER — SODIUM CHLORIDE 0.9 % IJ SOLN
3.0000 mL | Freq: Two times a day (BID) | INTRAMUSCULAR | Status: DC
  Administered 2015-08-14 – 2015-08-17 (×7): 3 mL via INTRAVENOUS

## 2015-08-13 MED ORDER — MIDAZOLAM HCL 2 MG/2ML IJ SOLN: 2.0000 mg | INTRAMUSCULAR | Status: DC | PRN

## 2015-08-13 MED ORDER — ACETAMINOPHEN 650 MG RE SUPP: 650.0000 mg | Freq: Once | RECTAL | Status: AC

## 2015-08-13 MED ORDER — SODIUM CHLORIDE 0.9 % IV SOLN
INTRAVENOUS | Status: DC
  Administered 2015-08-13: 15:00:00 via INTRAVENOUS

## 2015-08-13 MED ORDER — ASPIRIN 81 MG PO CHEW: 324.0000 mg | CHEWABLE_TABLET | Freq: Every day | ORAL | Status: DC

## 2015-08-13 MED ORDER — ROCURONIUM BROMIDE 100 MG/10ML IV SOLN
INTRAVENOUS | Status: DC | PRN
  Administered 2015-08-13: 20 mg via INTRAVENOUS
  Administered 2015-08-13 (×2): 50 mg via INTRAVENOUS

## 2015-08-13 MED ORDER — SODIUM CHLORIDE 0.9 % IV SOLN
INTRAVENOUS | Status: DC
  Administered 2015-08-13: 1.9 [IU]/h via INTRAVENOUS
  Filled 2015-08-13 (×2): qty 2.5

## 2015-08-13 MED ORDER — CHLORHEXIDINE GLUCONATE 0.12% ORAL RINSE (MEDLINE KIT)
15.0000 mL | Freq: Two times a day (BID) | OROMUCOSAL | Status: DC
  Administered 2015-08-13 – 2015-08-14 (×2): 15 mL via OROMUCOSAL

## 2015-08-13 MED ORDER — PROPOFOL 10 MG/ML IV BOLUS
INTRAVENOUS | Status: DC | PRN
  Administered 2015-08-13: 100 mg via INTRAVENOUS

## 2015-08-13 MED ORDER — ACETAMINOPHEN 160 MG/5ML PO SOLN
650.0000 mg | Freq: Once | ORAL | Status: AC
  Administered 2015-08-13: 650 mg

## 2015-08-13 MED ORDER — ATORVASTATIN CALCIUM 80 MG PO TABS
80.0000 mg | ORAL_TABLET | Freq: Every day | ORAL | Status: DC
  Administered 2015-08-14 – 2015-08-20 (×7): 80 mg via ORAL
  Filled 2015-08-13 (×7): qty 1

## 2015-08-13 MED ORDER — METOPROLOL TARTRATE 12.5 MG HALF TABLET: 12.5000 mg | ORAL_TABLET | Freq: Two times a day (BID) | ORAL | Status: DC

## 2015-08-13 MED ORDER — LIDOCAINE-EPINEPHRINE (PF) 1 %-1:200000 IJ SOLN
INTRAMUSCULAR | Status: DC | PRN
  Administered 2015-08-13: 10 mL

## 2015-08-13 MED ORDER — POTASSIUM CHLORIDE 10 MEQ/50ML IV SOLN
10.0000 meq | INTRAVENOUS | Status: AC
  Administered 2015-08-13 (×3): 10 meq via INTRAVENOUS

## 2015-08-13 MED ORDER — FENTANYL CITRATE (PF) 250 MCG/5ML IJ SOLN
INTRAMUSCULAR | Status: AC
  Filled 2015-08-13: qty 25

## 2015-08-13 MED ORDER — SODIUM CHLORIDE 0.9 % IJ SOLN
10.0000 mL | Freq: Two times a day (BID) | INTRAMUSCULAR | Status: DC
  Administered 2015-08-13 – 2015-08-14 (×3): 10 mL
  Administered 2015-08-15: 30 mL
  Administered 2015-08-15: 10 mL
  Administered 2015-08-16: 20 mL
  Administered 2015-08-16: 10 mL

## 2015-08-13 MED ORDER — SODIUM CHLORIDE 0.9 % IJ SOLN
OROMUCOSAL | Status: DC | PRN
  Administered 2015-08-13 (×3): 4 mL via TOPICAL

## 2015-08-13 MED ORDER — ACETAMINOPHEN 160 MG/5ML PO SOLN
1000.0000 mg | Freq: Four times a day (QID) | ORAL | Status: DC
  Administered 2015-08-13 – 2015-08-14 (×2): 1000 mg
  Filled 2015-08-13 (×2): qty 40.6

## 2015-08-13 MED ORDER — ROCURONIUM BROMIDE 50 MG/5ML IV SOLN
INTRAVENOUS | Status: AC
  Filled 2015-08-13: qty 2

## 2015-08-13 MED ORDER — SODIUM CHLORIDE 0.45 % IV SOLN
INTRAVENOUS | Status: DC | PRN
  Administered 2015-08-13: 14:00:00 via INTRAVENOUS

## 2015-08-13 MED ORDER — ACETAMINOPHEN 500 MG PO TABS
1000.0000 mg | ORAL_TABLET | Freq: Four times a day (QID) | ORAL | Status: DC
  Administered 2015-08-14 – 2015-08-17 (×13): 1000 mg via ORAL
  Filled 2015-08-13 (×11): qty 2

## 2015-08-13 MED ORDER — HEMOSTATIC AGENTS (NO CHARGE) OPTIME
TOPICAL | Status: DC | PRN
  Administered 2015-08-13 (×2): 1 via TOPICAL

## 2015-08-13 MED ORDER — POTASSIUM CHLORIDE 10 MEQ/50ML IV SOLN
10.0000 meq | INTRAVENOUS | Status: AC
  Administered 2015-08-13 (×2): 10 meq via INTRAVENOUS

## 2015-08-13 SURGICAL SUPPLY — 145 items
ADH SKN CLS APL DERMABOND .7 (GAUZE/BANDAGES/DRESSINGS) ×3
APL SKNCLS STERI-STRIP NONHPOA (GAUZE/BANDAGES/DRESSINGS) ×3
BAG DECANTER FOR FLEXI CONT (MISCELLANEOUS) ×12 IMPLANT
BANDAGE ACE 4X5 VEL STRL LF (GAUZE/BANDAGES/DRESSINGS) ×2 IMPLANT
BANDAGE ACE 6X5 VEL STRL LF (GAUZE/BANDAGES/DRESSINGS) ×2 IMPLANT
BANDAGE ELASTIC 4 VELCRO ST LF (GAUZE/BANDAGES/DRESSINGS) ×5 IMPLANT
BANDAGE ELASTIC 6 VELCRO ST LF (GAUZE/BANDAGES/DRESSINGS) ×5 IMPLANT
BASKET HEART  (ORDER IN 25'S) (MISCELLANEOUS) ×1
BASKET HEART (ORDER IN 25'S) (MISCELLANEOUS) ×1
BASKET HEART (ORDER IN 25S) (MISCELLANEOUS) ×3 IMPLANT
BENZOIN TINCTURE PRP APPL 2/3 (GAUZE/BANDAGES/DRESSINGS) ×2 IMPLANT
BLADE STERNUM SYSTEM 6 (BLADE) ×5 IMPLANT
BLADE SURG 11 STRL SS (BLADE) ×2 IMPLANT
BNDG GAUZE ELAST 4 BULKY (GAUZE/BANDAGES/DRESSINGS) ×5 IMPLANT
BOOT SUTURE AID YELLOW STND (SUTURE) ×2 IMPLANT
CANISTER SUCTION 2500CC (MISCELLANEOUS) ×10 IMPLANT
CANNULA VESSEL 3MM 2 BLNT TIP (CANNULA) ×7 IMPLANT
CATH FOLEY 2WAY SLVR  5CC 12FR (CATHETERS) ×2
CATH FOLEY 2WAY SLVR 5CC 12FR (CATHETERS) IMPLANT
CATH ROBINSON RED A/P 18FR (CATHETERS) ×15 IMPLANT
CATH THORACIC 28FR (CATHETERS) ×5 IMPLANT
CATH THORACIC 36FR (CATHETERS) ×5 IMPLANT
CATH THORACIC 36FR RT ANG (CATHETERS) ×5 IMPLANT
CLIP TI MEDIUM 24 (CLIP) ×5 IMPLANT
CLIP TI WIDE RED SMALL 24 (CLIP) ×5 IMPLANT
CONN 3/8X3/8 STRAIGHT W/LL (MISCELLANEOUS) ×2 IMPLANT
COVER SURGICAL LIGHT HANDLE (MISCELLANEOUS) ×5 IMPLANT
CRADLE DONUT ADULT HEAD (MISCELLANEOUS) ×8 IMPLANT
DERMABOND ADVANCED (GAUZE/BANDAGES/DRESSINGS) ×2
DERMABOND ADVANCED .7 DNX12 (GAUZE/BANDAGES/DRESSINGS) IMPLANT
DRAIN CHANNEL 15F RND FF W/TCR (WOUND CARE) ×2 IMPLANT
DRAPE CARDIOVASCULAR INCISE (DRAPES)
DRAPE PERI GROIN 82X75IN TIB (DRAPES) ×2 IMPLANT
DRAPE SLUSH/WARMER DISC (DRAPES) ×5 IMPLANT
DRAPE SRG 135X102X78XABS (DRAPES) ×3 IMPLANT
DRSG AQUACEL AG ADV 3.5X14 (GAUZE/BANDAGES/DRESSINGS) ×2 IMPLANT
DRSG COVADERM 4X14 (GAUZE/BANDAGES/DRESSINGS) ×5 IMPLANT
DRSG COVADERM 4X6 (GAUZE/BANDAGES/DRESSINGS) ×2 IMPLANT
ELECT CAUTERY BLADE 6.4 (BLADE) ×5 IMPLANT
ELECT REM PT RETURN 9FT ADLT (ELECTROSURGICAL) ×10
ELECTRODE REM PT RTRN 9FT ADLT (ELECTROSURGICAL) ×9 IMPLANT
EVACUATOR SILICONE 100CC (DRAIN) IMPLANT
GAUZE SPONGE 4X4 12PLY STRL (GAUZE/BANDAGES/DRESSINGS) ×10 IMPLANT
GLOVE BIO SURGEON STRL SZ 6 (GLOVE) ×2 IMPLANT
GLOVE BIO SURGEON STRL SZ 6.5 (GLOVE) ×2 IMPLANT
GLOVE BIO SURGEON STRL SZ7 (GLOVE) ×4 IMPLANT
GLOVE BIO SURGEON STRL SZ7.5 (GLOVE) ×5 IMPLANT
GLOVE BIO SURGEONS STRL SZ 6.5 (GLOVE) ×2
GLOVE BIOGEL PI IND STRL 6 (GLOVE) IMPLANT
GLOVE BIOGEL PI IND STRL 6.5 (GLOVE) IMPLANT
GLOVE BIOGEL PI IND STRL 7.0 (GLOVE) IMPLANT
GLOVE BIOGEL PI IND STRL 8 (GLOVE) ×3 IMPLANT
GLOVE BIOGEL PI INDICATOR 6 (GLOVE) ×6
GLOVE BIOGEL PI INDICATOR 6.5 (GLOVE) ×4
GLOVE BIOGEL PI INDICATOR 7.0 (GLOVE) ×6
GLOVE BIOGEL PI INDICATOR 8 (GLOVE) ×2
GLOVE EUDERMIC 7 POWDERFREE (GLOVE) ×10 IMPLANT
GLOVE ORTHO TXT STRL SZ7.5 (GLOVE) IMPLANT
GOWN STRL REUS W/ TWL LRG LVL3 (GOWN DISPOSABLE) ×21 IMPLANT
GOWN STRL REUS W/ TWL XL LVL3 (GOWN DISPOSABLE) ×3 IMPLANT
GOWN STRL REUS W/TWL LRG LVL3 (GOWN DISPOSABLE) ×40
GOWN STRL REUS W/TWL XL LVL3 (GOWN DISPOSABLE) ×5
HEMOSTAT POWDER SURGIFOAM 1G (HEMOSTASIS) ×15 IMPLANT
HEMOSTAT SURGICEL .5X2 ABSORB (HEMOSTASIS) ×2 IMPLANT
HEMOSTAT SURGICEL 2X14 (HEMOSTASIS) ×7 IMPLANT
INSERT FOGARTY 61MM (MISCELLANEOUS) IMPLANT
INSERT FOGARTY XLG (MISCELLANEOUS) IMPLANT
KIT BASIN OR (CUSTOM PROCEDURE TRAY) ×8 IMPLANT
KIT CATH CPB BARTLE (MISCELLANEOUS) ×5 IMPLANT
KIT ROOM TURNOVER OR (KITS) ×8 IMPLANT
KIT SUCTION CATH 14FR (SUCTIONS) ×5 IMPLANT
KIT VASOVIEW W/TROCAR VH 2000 (KITS) ×5 IMPLANT
LIQUID BAND (GAUZE/BANDAGES/DRESSINGS) ×5 IMPLANT
LOOP VESSEL MAXI BLUE (MISCELLANEOUS) ×2 IMPLANT
LOOP VESSEL MINI RED (MISCELLANEOUS) ×2 IMPLANT
NDL HYPO 25X1 1.5 SAFETY (NEEDLE) ×3 IMPLANT
NEEDLE HYPO 25X1 1.5 SAFETY (NEEDLE) ×5 IMPLANT
NS IRRIG 1000ML POUR BTL (IV SOLUTION) ×35 IMPLANT
PACK CAROTID (CUSTOM PROCEDURE TRAY) ×3 IMPLANT
PACK OPEN HEART (CUSTOM PROCEDURE TRAY) ×5 IMPLANT
PAD ARMBOARD 7.5X6 YLW CONV (MISCELLANEOUS) ×16 IMPLANT
PAD ELECT DEFIB RADIOL ZOLL (MISCELLANEOUS) ×5 IMPLANT
PATCH HEMASHIELD 8X150 (Vascular Products) ×2 IMPLANT
PENCIL BUTTON HOLSTER BLD 10FT (ELECTRODE) ×5 IMPLANT
PUNCH AORTIC ROTATE 4.0MM (MISCELLANEOUS) IMPLANT
PUNCH AORTIC ROTATE 4.5MM 8IN (MISCELLANEOUS) ×5 IMPLANT
PUNCH AORTIC ROTATE 5MM 8IN (MISCELLANEOUS) IMPLANT
SET CARDIOPLEGIA MPS 5001102 (MISCELLANEOUS) ×2 IMPLANT
SHUNT CAROTID BYPASS 10 (VASCULAR PRODUCTS) ×2 IMPLANT
SHUNT CAROTID BYPASS 12FRX15.5 (VASCULAR PRODUCTS) IMPLANT
SPONGE INTESTINAL PEANUT (DISPOSABLE) ×3 IMPLANT
SPONGE LAP 18X18 X RAY DECT (DISPOSABLE) ×2 IMPLANT
SPONGE LAP 4X18 X RAY DECT (DISPOSABLE) ×3 IMPLANT
SPONGE SURGIFOAM ABS GEL 100 (HEMOSTASIS) IMPLANT
STAPLER VISISTAT 35W (STAPLE) ×2 IMPLANT
SUT BONE WAX W31G (SUTURE) ×5 IMPLANT
SUT ETHIBOND 2 0 SH (SUTURE) ×20
SUT ETHIBOND 2 0 SH 36X2 (SUTURE) IMPLANT
SUT MNCRL AB 4-0 PS2 18 (SUTURE) ×2 IMPLANT
SUT PROLENE 3 0 SH DA (SUTURE) IMPLANT
SUT PROLENE 3 0 SH1 36 (SUTURE) ×5 IMPLANT
SUT PROLENE 4 0 RB 1 (SUTURE) ×5
SUT PROLENE 4 0 SH DA (SUTURE) IMPLANT
SUT PROLENE 4-0 RB1 .5 CRCL 36 (SUTURE) IMPLANT
SUT PROLENE 5 0 C 1 36 (SUTURE) ×4 IMPLANT
SUT PROLENE 6 0 BV (SUTURE) ×9 IMPLANT
SUT PROLENE 6 0 C 1 30 (SUTURE) ×4 IMPLANT
SUT PROLENE 7 0 BV 1 (SUTURE) ×6 IMPLANT
SUT PROLENE 7 0 BV1 MDA (SUTURE) ×7 IMPLANT
SUT PROLENE 8 0 BV175 6 (SUTURE) ×4 IMPLANT
SUT SILK  1 MH (SUTURE) ×6
SUT SILK 1 MH (SUTURE) IMPLANT
SUT SILK 1 TIES 10X30 (SUTURE) ×2 IMPLANT
SUT SILK 2 0 FS (SUTURE) IMPLANT
SUT SILK 2 0 SH CR/8 (SUTURE) ×4 IMPLANT
SUT SILK 2 0 TIES 10X30 (SUTURE) ×2 IMPLANT
SUT SILK 2 0 TIES 17X18 (SUTURE) ×5
SUT SILK 2-0 18XBRD TIE BLK (SUTURE) IMPLANT
SUT SILK 3 0 (SUTURE) ×5
SUT SILK 3 0 SH CR/8 (SUTURE) ×2 IMPLANT
SUT SILK 3-0 18XBRD TIE 12 (SUTURE) IMPLANT
SUT SILK 4 0 TIE 10X30 (SUTURE) ×4 IMPLANT
SUT STEEL STERNAL CCS#1 18IN (SUTURE) IMPLANT
SUT STEEL SZ 6 DBL 3X14 BALL (SUTURE) ×6 IMPLANT
SUT TEM PAC WIRE 2 0 SH (SUTURE) ×8 IMPLANT
SUT VIC AB 1 CTX 36 (SUTURE) ×10
SUT VIC AB 1 CTX36XBRD ANBCTR (SUTURE) ×6 IMPLANT
SUT VIC AB 2-0 CT1 27 (SUTURE) ×5
SUT VIC AB 2-0 CT1 TAPERPNT 27 (SUTURE) IMPLANT
SUT VIC AB 2-0 CTX 27 (SUTURE) ×4 IMPLANT
SUT VIC AB 3-0 SH 27 (SUTURE) ×10
SUT VIC AB 3-0 SH 27X BRD (SUTURE) ×3 IMPLANT
SUT VIC AB 3-0 X1 27 (SUTURE) ×6 IMPLANT
SUT VICRYL 4-0 PS2 18IN ABS (SUTURE) ×5 IMPLANT
SUTURE E-PAK OPEN HEART (SUTURE) ×3 IMPLANT
SYR CONTROL 10ML LL (SYRINGE) ×5 IMPLANT
SYR TOOMEY 50ML (SYRINGE) ×2 IMPLANT
SYSTEM SAHARA CHEST DRAIN ATS (WOUND CARE) ×5 IMPLANT
TAPE CLOTH SURG 4X10 WHT LF (GAUZE/BANDAGES/DRESSINGS) ×2 IMPLANT
TOWEL OR 17X24 6PK STRL BLUE (TOWEL DISPOSABLE) ×7 IMPLANT
TOWEL OR 17X26 10 PK STRL BLUE (TOWEL DISPOSABLE) ×7 IMPLANT
TRAY FOLEY IC TEMP SENS 16FR (CATHETERS) ×5 IMPLANT
TUBING INSUFFLATION (TUBING) ×5 IMPLANT
UNDERPAD 30X30 INCONTINENT (UNDERPADS AND DIAPERS) ×5 IMPLANT
WATER STERILE IRR 1000ML POUR (IV SOLUTION) ×13 IMPLANT

## 2015-08-13 NOTE — Op Note (Signed)
    NAME: [name]   MRN: [id number] DOB: 09-03-1955    DATE OF OPERATION: 08/13/2015  PREOP DIAGNOSIS: greater than 80% left carotid stenosis  POSTOP DIAGNOSIS: same  PROCEDURE: Left carotid endarterectomy with Dacron patch angioplasty  SURGEON: Judeth Cornfield. Scot Dock, MD, FACS  ASSIST: Leontine Locket, PA  ANESTHESIA: Gen.   EBL: per anesthesia record.  INDICATIONS: [name] is a 60 y.o. male presents for coronary artery bypass grafting. He had a critical left carotid stenosis and combined left carotid endarterectomy and CABG was recommended in order to lower his risk of perioperative and future stroke.  FINDINGS: the plaque in the left common carotid artery extended fairly low in the common carotid artery and also quite high.  TECHNIQUE: The patient was taken to the operating room and monitoring lines had been placed by anesthesia. The patient received a general anesthetic. After careful positioning, the patient was prepped and draped for combined left carotid endarterectomy and CABG. An incision was made along the anterior border of the sternocleidomastoid on the left and the dissection carried down to the common carotid artery which was dissected free and controlled with Rummel tourniquet. The plaque extended fairly low and I had to extend the incision proximally and get control of the common carotid artery further proximally below the plaque. The facial vein was divided between 2-0 silk ties. The internal carotid artery was controlled above the plaque. Of note the plaque extended fairly high up the internal carotid artery. The external carotid artery was controlled with a blue vessel loop and the superior thyroid artery was controlled with a silk tie. The patient was then heparinized. Clamps were then placed on the internal carotid artery and then the common carotid artery and external carotid artery. A longitudinal arteriotomy was made in the common carotid artery. I tried  to extend the arteriotomy to the plaque but there was no lumen really tried to make a separate arteriotomy distally and then connected to anteriorly so that I could place a 10 shunt. The shunt was placed into the internal carotid artery and it was meager backbleeding. The shunt was then placed into the common carotid artery and secured with Rummel tourniquet. Flow was reestablished to the shunt. An endarterectomy plane was established proximally and the plaque was sharply divided. Eversion endarterectomy was performed of the external carotid artery. Distally although that plaque went quite high and it was a nice taper and no tacking sutures were required. The artery was irrigated with cemented heparin and dextran all loose debris removed. A Dacron patch was then sewn using continuous 6-0 Prolene suture. Prior to completing the patch closure, the shunt was removed. The arteries were backbled and flushed appropriately and the anastomosis completed. Flow was reestablished first to the external carotid artery and into the internal carotid artery. At the completion was good pulse distal to the patch and a good Doppler signal with good diastolic flow. Hemostasis was obtained in the wound. The wound was then closed loosely over a sponge while the CABG was performed as dictated separately by Dr. Cyndia Bent.  Joseph Mayo, MD, FACS [facility]  DATE OF DICTATION:   08/13/2015

## 2015-08-13 NOTE — Op Note (Signed)
CARDIOVASCULAR SURGERY OPERATIVE NOTE  08/13/2015  Surgeon:  [name], MD  First Assistant: Lars Pinks,  PA-C   Preoperative Diagnosis:  Severe multi-vessel coronary artery disease   Postoperative Diagnosis:  Same   Procedure:  1. Median Sternotomy 2. Extracorporeal circulation 3.   Coronary artery bypass grafting x 3   Left internal mammary graft to the LAD  SVG to Ramus  SVG to OM2  4.   Endoscopic vein harvest from the right leg   Anesthesia:  General Endotracheal   Clinical History/Surgical Indication:   The patient is a 60 year old gentleman with a history of DM with complications, hypertension, hyperlipidemia, PAF, previous smoking, prior stroke and cerebrovascular disease and mental retardation who has been followed by Dr. [name] for carotid artery disease. He has had progression of his left carotid stenosis to greater than 80% and left CEA was recommended. He had been admitted in March 2016 with CHF and a NSTEMI and has occasional chest pain and left shoulder pain. He had an abnormal nuclear stress test and catheterization shows severe multi-vessel coronary disease with diffusely diseased and calcified coronary arteries. He was initially scheduled for combined left CEA and CABG on 05/19/2015 but his Hgb A1c preop was 10.7 and his glucose was 450 when he came to Short Stay the morning of surgery so it was cancelled. He has been further educated on dietary modification and has followed up with his PCP and followed in our office with serial Hgb A1c levels. His glucose has been under good control and his Hgb A1c is now 5.6.  He has severe multi-vessel coronary artery disease with a high risk nuclear stress showing a large mostly reversible inferior, apical and inferolateral defect. I agree that combined left CEA and CABG is indicated for prevention of  stroke and MI and to improve his quality of life. I discussed the operative procedure with the patient including alternatives, benefits and risks; including but not limited to bleeding, blood transfusion, infection, stroke, myocardial infarction, graft failure, heart block requiring a permanent pacemaker, organ dysfunction, and death. [name] understands and agrees to proceed.    Preparation:  The patient was seen in the preoperative holding area and the correct patient, correct operation were confirmed with the patient after reviewing the medical record and catheterization. The consent was signed by me. Preoperative antibiotics were given. A pulmonary arterial line and radial arterial line were placed by the anesthesia team. The patient was taken back to the operating room and positioned supine on the operating room table. After being placed under general endotracheal anesthesia by the anesthesia team a foley catheter was placed. The neck, chest, abdomen, and both legs were prepped with betadine soap and solution and draped in the usual sterile manner. The left carotid endarterectomy was performed by Dr. Scot Dock first and at the conclusion of that procedure the neck incision closed over packing while the CABG was done. A surgical time-out was taken and the correct patient and operative procedure were confirmed with the nursing and anesthesia staff.  TEE: performed by Dr. Roberts Gaudy. This showed normal LV and RV function, trivial MR, no TR, no AS or AI.  Cardiopulmonary Bypass:  A median sternotomy was performed. The pericardium was opened in the midline. Right ventricular function appeared normal. The ascending aorta was of normal size and had no palpable plaque. There were no contraindications to aortic cannulation or cross-clamping. The patient was fully systemically heparinized and the ACT was maintained >  400 sec. The proximal aortic arch was cannulated with a 20 F aortic cannula for  arterial inflow. Venous cannulation was performed via the right atrial appendage using a two-staged venous cannula. An antegrade cardioplegia/vent cannula was inserted into the mid-ascending aorta. Aortic occlusion was performed with a single cross-clamp. Systemic cooling to 32 degrees Centigrade and topical cooling of the heart with iced saline were used. Hyperkalemic antegrade cold blood cardioplegia was used to induce diastolic arrest and was then given at about 20 minute intervals throughout the period of arrest to maintain myocardial temperature at or below 10 degrees centigrade. A temperature probe was inserted into the interventricular septum and an insulating pad was placed in the pericardium.   Left internal mammary harvest:  The left side of the sternum was retracted using the Rultract retractor. The left internal mammary artery was harvested as a pedicle graft. All side branches were clipped. It was a medium-sized vessel of good quality with excellent blood flow. It was ligated distally and divided. It was sprayed with topical papaverine solution to prevent vasospasm.   Endoscopic vein harvest:  The right greater saphenous vein was harvested endoscopically through a 2 cm incision medial to the right knee. It was harvested from the upper thigh to below the knee. It was a medium-sized vein of good quality. The side branches were all ligated with 4-0 silk ties.    Coronary arteries:  The coronary arteries were examined.   LAD:  The LAD was heavily diseased proximally but the mid and distal portion had only mild disease with no stenosis seen distally on the cath.   LCX:  The Ramus was a large branch with proximal calcific disease only. The OM1 was very small. OM2 was a moderate sized vessel with no distal disease.  RCA:  The RCA and PDA were severely and diffusely diseased with calcific plaque and were not graftable.    Grafts:  1. LIMA to the LAD: 1.75 mm. It was sewn end to side  using 8-0 prolene continuous suture. 2. SVG to Ramus:  1.75 mm. It was sewn end to side using 7-0 prolene continuous suture. 3. SVG to OM2:  1.6 mm. It was sewn end to side using 7-0 prolene continuous suture.   The proximal vein graft anastomoses were performed to the mid-ascending aorta using continuous 6-0 prolene suture. Graft markers were placed around the proximal anastomoses.   Completion:  The patient was rewarmed to 37 degrees Centigrade. The clamp was removed from the LIMA pedicle and there was rapid warming of the septum but no rhythm.  The crossclamp was removed with a time of 65 minutes.  The distal and proximal anastomoses were checked for hemostasis. The position of the grafts was satisfactory. Two temporary epicardial pacing wires were placed on the right atrium and two on the right ventricle. The patient was weaned from CPB without difficulty on no inotropes. CPB time was 88 minutes. Cardiac output was 6 LPM. Heparin was fully reversed with protamine and the aortic and venous cannulas removed. Hemostasis was achieved. Mediastinal and left pleural drainage tubes were placed. The sternum was closed with double #6 stainless steel wires. The fascia was closed with continuous # 1 vicryl suture. The subcutaneous tissue was closed with 2-0 vicryl continuous suture. The skin was closed with 3-0 vicryl subcuticular suture. The left neck wound was opened and the packing removed. There was complete hemostasis. There was a strong pulse and good doppler signal distal to the patch. A 61F round  drain was brought through a separate stab incision and positioned in the carotid bed. The fascia was approximated with continuous 2-0 vicryl suture. The platysma muscle was approximated with continuous 3-0 vicryl suture and the skin with 3-0 vicryl subcuticular suture. All sponge, needle, and instrument counts were reported correct at the end of the case. Dry sterile dressings were placed over the incisions and  around the chest tubes which were connected to pleurevac suction. The patient was then transported to the surgical intensive care unit in critical but stable condition.

## 2015-08-13 NOTE — Progress Notes (Signed)
Patient returned to rate of 12 due to sleepiness and low rate.

## 2015-08-13 NOTE — H&P (Signed)
[address]       [place] [address]             [phone]      Cardiothoracic Surgery History and Physical    PCP is [name], MD Referring Provider is Belva Crome, MD  Chief Complaint  Patient presents with  . Coronary Artery Disease        HPI:  The patient is a 60 year old gentleman with a history of DM with complications, hypertension, hyperlipidemia, PAF, previous smoking, prior stroke and cerebrovascular disease and mental retardation who has been followed by Dr. [name] for carotid artery disease. He has had progression of his left carotid stenosis to greater than 80% and left CEA was recommended. He had been admitted in March 2016 with CHF and a NSTEMI and has occasional chest pain and left shoulder pain. He had an abnormal nuclear stress test and catheterization shows severe multi-vessel coronary disease with diffusely diseased and calcified coronary arteries. He was initially scheduled for combined left CEA and CABG on 05/19/2015 but his Hgb A1c preop was 10.7 and his glucose was 450 when he came to Short Stay the morning of surgery so it was cancelled. He has been further educated on dietary modification and has followed up with his PCP and followed in our office with serial Hgb A1c levels. His glucose has been under good control and his Hgb A1c is now 5.6.  Past Medical History  Diagnosis Date  . Hypertension   . Diabetes mellitus   . Mental retardation     Sister helps to take care of him and takes him to appts  . Tobacco user     Smokes 1ppd for multiple years. Quit after hosp 09/2010.  Marland Kitchen Anemia     History of Iron Def Anemia  . Anxiety   . Hyperlipidemia   . PAF (paroxysmal atrial fibrillation) 06/2009    CHADS score 2 (HTN, DM), was not on coumadin, but now on Pradaxa for Afib  . Lung nodule   .  Chronic low back pain   . CVA (cerebral infarction) 09/2010    Bilateral with Left > Right  . Abdominal hernia     Chronic, not a good surgical candidate  . Carotid artery occlusion   . Obesity   . COPD (chronic obstructive pulmonary disease)   . Abscess, abdomen 12/31/2010    Referred to Wound Care in 01/2011 because of multiple abd abscess with VERY large ventral hernia (please look at image of CT abd/pelvis 09/2010). Because of hernia I was hesitant to I&D.   Marland Kitchen GERD (gastroesophageal reflux disease)   . Pneumonia     hx of  . Chronic diastolic CHF (congestive heart failure)     takes Lasix  . Itching     all over body; pt scratches and has sores on bilateral arms and abdomen  . Hx of echocardiogram     Echo 5/16: Mild LVH, EF 55%, indeterm. diast function, WMA could not be ruled out, MAC, trivial MR, mild LAE, normal RVF  . Myocardial infarction 2016 ?    Heart attack ( Per pt. )  . History of nuclear stress test     Myoview 9/16: Inferior, apical and inf-lateral ischemia; not gated; High Risk    Past Surgical History  Procedure Laterality Date  . Carotid arteriogram  10/2010    30% right ICA stenosis, 40% left ICA stenosis   .  Transesophageal echocardiogram  09/2010    No ASD or PFO. EF 60-65%. Normal systolic function. No evidence of thrombus.   . Transthoracic echocardiogram  09/2010     The cavity size was normal. Systolic function was vigorous. EF 65-70%. Normal wall funciton.   . Debridment of decubitus ulcer Right 02/13/2013  . I&d extremity Right 02/13/2013    Procedure: IRRIGATION AND DEBRIDEMENT FOOT ULCER; Surgeon: Johnny Bridge, MD; Location: [place]; Service: Orthopedics; Laterality: Right; PULSE LAVAGE  . Cataract extraction, bilateral      . Arm surgery Left     as a child  . Amputation Right 03/29/2013    Procedure: AMPUTATION RAY; Surgeon: Newt Minion, MD; Location: [place]; Service: Orthopedics; Laterality: Right; Right Foot 3rd and Possible 4th Ray Amputation  . Multiple tooth extractions    . Amputation Right 04/23/2013    Procedure: AMPUTATION RIGHT MID-FOOT; Surgeon: Newt Minion, MD; Location: [place]; Service: Orthopedics; Laterality: Right;  . Esophagogastroduodenoscopy (egd) with propofol N/A 06/10/2014    Procedure: ESOPHAGOGASTRODUODENOSCOPY (EGD) WITH PROPOFOL; Surgeon: Jerene Bears, MD; Location: [facility]; Service: Gastroenterology; Laterality: N/A;  . Colonoscopy with propofol N/A 06/10/2014    Procedure: COLONOSCOPY WITH PROPOFOL; Surgeon: Jerene Bears, MD; Location: [facility]; Service: Gastroenterology; Laterality: N/A;  . Givens capsule study N/A 07/09/2014    Procedure: GIVENS CAPSULE STUDY; Surgeon: Jerene Bears, MD; Location: [facility]; Service: Gastroenterology; Laterality: N/A;  . Cardiac catheterization N/A 04/30/2015    Procedure: Left Heart Cath and Coronary Angiography; Surgeon: Belva Crome, MD; Location: [facility]; Service: Cardiovascular; Laterality: N/A;    Family History  Problem Relation Age of Onset  . Heart disease Mother   . Hypertension Sister   . Heart disease Sister   . Heart disease Brother   . Heart disease Father   . Peripheral vascular disease Father   . Heart disease Maternal Grandmother   . Heart attack Maternal Grandmother   . Colon cancer Neg Hx   . Breast cancer Maternal Grandmother   . Stomach cancer Maternal Uncle   . Stroke Neg Hx     Social History Social History  Substance Use Topics  . Smoking status: Former  Smoker -- 1.00 packs/day for 42 years    Types: Cigarettes    Quit date: 11/10/2010  . Smokeless tobacco: Former Systems developer    Quit date: 10/02/2010  . Alcohol Use: 0.0 oz/week    0 Standard drinks or equivalent per week     Comment: rare    Current Outpatient Prescriptions  Medication Sig Dispense Refill  . aspirin EC 81 MG EC tablet Take 1 tablet (81 mg total) by mouth daily. 90 tablet 3  . atorvastatin (LIPITOR) 80 MG tablet Take 1 tablet (80 mg total) by mouth daily at 6 PM. 45 tablet 0  . Cholecalciferol (VITAMIN D3) 1000 UNITS CAPS Take 1,000 Units by mouth daily.    . clonazePAM (KLONOPIN) 0.5 MG tablet Take 1 tablet (0.5 mg total) by mouth at bedtime. 30 tablet 0  . dabigatran (PRADAXA) 150 MG CAPS Take 150 mg by mouth every 12 (twelve) hours.    Marland Kitchen diltiazem (CARDIZEM CD) 180 MG 24 hr capsule Take 1 capsule (180 mg total) by mouth daily. 30 capsule 0  . furosemide (LASIX) 80 MG tablet Take 1 tablet (80 mg total) by mouth 2 (two) times daily. 60 tablet 0  . gabapentin (NEURONTIN) 300 MG capsule Take 1 capsule (300 mg total) by mouth 2 (  two) times daily. 60 capsule 0  . HYDROcodone-acetaminophen (NORCO/VICODIN) 5-325 MG per tablet 1 by mouth once daily as needed for pain control 30 tablet 0  . hydrocortisone 2.5 % ointment Apply topically 3 (three) times daily. To leg around wound. Do not apply to broken skin. 30 g 0  . insulin glargine (LANTUS) 100 UNIT/ML injection Inject 60 Units into the skin at bedtime.     . isosorbide mononitrate (IMDUR) 30 MG 24 hr tablet Take 30 mg by mouth every morning.    . magnesium oxide (MAG-OX) 400 MG tablet Take 400 mg by mouth daily.    . metFORMIN (GLUCOPHAGE) 1000 MG tablet Take 1,000 mg by mouth 2 (two) times daily with a meal.    . metoprolol  succinate (TOPROL-XL) 50 MG 24 hr tablet Take 1 tablet (50 mg total) by mouth as directed. Take 1/2 tab daily per PACE 12/22/14; Take with or immediately following a meal.    . nitroGLYCERIN (NITROSTAT) 0.4 MG SL tablet Place 0.4 mg under the tongue every 5 (five) minutes as needed for chest pain.     Marland Kitchen omeprazole (PRILOSEC) 20 MG capsule Take 20 mg by mouth daily.    Marland Kitchen PARoxetine (PAXIL) 40 MG tablet Take 40 mg by mouth every morning.    . saccharomyces boulardii (FLORASTOR) 250 MG capsule Take 1 capsule (250 mg total) by mouth 2 (two) times daily. 60 capsule 2  . ferrous gluconate (FERGON) 324 MG tablet Take 1 tablet (324 mg total) by mouth 2 (two) times daily with a meal. 60 tablet 0   No current facility-administered medications for this visit.    Allergies  Allergen Reactions  . Penicillins Hives and Nausea And Vomiting    Review of Systems  Constitutional: Positive for fatigue. Negative for activity change and appetite change.  HENT: Negative.   Dentures  Eyes:   Glasses  Respiratory: Negative for shortness of breath.   Uses home oxygen at night 2L  Cardiovascular: Positive for chest pain. Negative for palpitations and leg swelling.  Gastrointestinal: Negative.  Endocrine: Negative.  Genitourinary: Negative.  Musculoskeletal: Negative.  Skin: Negative.  Allergic/Immunologic: Negative.  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  Hematological: Bruises/bleeds easily.  Psychiatric/Behavioral: The patient is nervous/anxious.    BP 111/59 mmHg  Pulse 65  Resp 20  Ht 5' (1.524 m)  Wt 206 lb (93.441 kg)  BMI 40.23 kg/m2  SpO2 93% Physical Exam  Constitutional: He is oriented to person, place, and time.  Chronically ill-appearing in no distress  HENT:  Head: Normocephalic and atraumatic.  Mouth/Throat:  Oropharynx is clear and moist.  Eyes: EOM are normal. Pupils are equal, round, and reactive to light.  Neck: Normal range of motion. Neck supple. No JVD present. No thyromegaly present.  Cardiovascular: Normal rate, regular rhythm, normal heart sounds and intact distal pulses. Exam reveals no gallop and no friction rub.  No murmur heard. Pulmonary/Chest: Effort normal and breath sounds normal. No respiratory distress.  Abdominal: Soft. Bowel sounds are normal. There is no tenderness.  Very large ventral abdominal hernia that looks like a panniculus.  Musculoskeletal: Normal range of motion. He exhibits no edema.  Right transmetatarsal amputation  Lymphadenopathy:   He has no cervical adenopathy.  Neurological: He is alert and oriented to person, place, and time. He has normal strength. No cranial nerve deficit or sensory deficit.  Skin: Skin is warm and dry.  Abrasions over shins.  Psychiatric: He has a  normal mood and affect. His behavior is normal.  Has good grasp of events and follows directions well. Has a good memory for what I have told him.     Diagnostic Tests:  Conclusion    1. Ost LAD to Mid LAD lesion, 95% stenosed. 2. Ost 1st Diag to 1st Diag lesion, 100% stenosed. 3. Prox RCA to Dist RCA lesion, 100% stenosed. 4. Ost RCA lesion, 80% stenosed. 5. RPDA lesion, 85% stenosed. 6. Dist RCA lesion, 100% stenosed. 7. Ost 3rd Mrg lesion, 90% stenosed. 8. Prox Cx to Mid Cx lesion, 70% stenosed. 9. Mid Cx lesion, 65% stenosed. 10. Ost Ramus to Ramus( or OM lesion, 65% stenosed. 11. Ost 2nd Diag to 2nd Diag lesion, 85% stenosed. 12. Post Atrio lesion, 100% stenosed. 19. Dist LAD lesion, 40% stenosed.   Severe, diffuse, and heavily calcified 3 vessel coronary disease.  Total occlusion of the mid RCA and distal RCA. The right coronary fills by collaterals from the LAD and circumflex. High-grade segmental proximal to mid LAD (80%), total occlusion of diagonal 1  (small branch), severe diffuse proximal disease in diagonal 2 (very small vessel), moderate to moderately severe first obtuse marginal (Ramus) stenosis, 90% ostial third obtuse marginal, diffuse 50-70% proximal and mid circumflex.  Significant left ventricular dysfunction with inferior wall severe hypokinesis. Depressed overall LV function with EF 35%.   RECOMMENDATIONS:   Consider surgical revascularization, prior to carotid endarterectomy or simultaneously.  Further management per primary cardiologist, Dr. Acie Fredrickson.  Continue current medical regimen.     Coronary Findings    Dominance: Right   Left Anterior Descending   . Ost LAD to Mid LAD lesion, 95% stenosed. The lesion is type C calcified tubular eccentric ulcerative .   Marland Kitchen Dist LAD lesion, 40% stenosed. diffuse eccentric .   Marland Kitchen [employer], production   . Ost 1st Diag to 1st Diag lesion, 100% stenosed. The lesion is type C .   . Second Diagonal Branch   The vessel is small in size.   Colon Flattery 2nd Diag to 2nd Diag lesion, 85% stenosed. The lesion is type C .     Ramus Intermedius   . Ost Ramus to Ramus lesion, 65% stenosed. The lesion is type C .     Left Circumflex  The vessel is small .   Marland Kitchen Prox Cx to Mid Cx lesion, 70% stenosed. The lesion is type C calcified eccentric .   Marland Kitchen Mid Cx lesion, 65% stenosed. The lesion is type C calcified eccentric .   Marland Kitchen First Obtuse Marginal Branch   The vessel is small in size.   Marland Kitchen [employer]   . Ost 2nd Mrg lesion, 90% stenosed. calcified eccentric .   Marland Kitchen Third Obtuse Marginal Branch   The vessel is small in size.     Right Coronary Artery   . Ost RCA lesion, 80% stenosed. The lesion is type C tubular eccentric .   Marland Kitchen Prox RCA to Dist RCA lesion, 100% stenosed. The lesion is type C .   Marland Kitchen Dist RCA lesion, 100% stenosed.    . Acute Marginal Branch   The vessel is small in size.   . Right Posterior Descending Artery   RPDA filled by collaterals from Dist LAD.   Marland Kitchen RPDA lesion, 85% stenosed. calcified eccentric .   Marland Kitchen Right Posterior Atrioventricular Branch   . Post Atrio lesion, 100% stenosed.   . Third Right Posterolateral   3rd RPLB filled by collaterals from Mid  Cx.      Wall Motion                 Left Heart    Left Ventricle The left ventricle is enlarged. The left ventricular ejection fraction is 35-45% by visual estimate.    Coronary Diagrams    Diagnostic Diagram            Implants    Name ID Temporary Type Supply   No information to display    Hemo Data       Most Recent Value   AO Systolic Pressure  123XX123 mmHg   AO Diastolic Pressure  54 mmHg   AO Mean  73 mmHg   LV Systolic Pressure  123XX123 mmHg   LV Diastolic Pressure  6 mmHg   LV EDP  15 mmHg   Arterial Occlusion Pressure Extended Systolic Pressure  0000000 mmHg   Arterial Occlusion Pressure Extended Diastolic Pressure  58 mmHg   Arterial Occlusion Pressure Extended Mean Pressure  81 mmHg   Left Ventricular Apex Extended Systolic Pressure  0000000 mmHg   Left Ventricular Apex Extended Diastolic Pressure  4 mmHg   Left Ventricular Apex Extended EDP Pressure  13 mmHg    Order-Level Documents:    There are no order-level documents.    Encounter-Level Documents - 04/28/15:      Scan on 05/02/2015 11:10 AM by Provider Default, MDScan on 05/02/2015 11:10 AM by Provider Default, MD     Scan on 05/02/2015 11:04 AM by Provider Default, MDScan on 05/02/2015 11:04 AM by Provider Default, MD     Scan on 04/30/2015 2:10 PM by Provider Default, MDScan on 04/30/2015 2:10 PM by  Provider Default, MD     Electronic signature on 04/30/2015 8:41 AM    Signed    Electronically signed by Belva Crome, MD on 04/30/15 at 1438 EDT           *[facility]*            [address], [place] [address]              [phone]  ------------------------------------------------------------------- Transthoracic Echocardiography  Patient:  Rohail, Schmoldt MR #:    [id number] Study Date: 12/18/2014 Gender:   M Age:    72 Height:   152.4 cm Weight:   104.3 kg BSA:    2.17 m^2 Pt. Status: Room:  Faylene Million, Scott T Joanna Puff T ATTENDING  Loralie Champagne, M.D. SONOGRAPHER Cindy Hazy, RDCS PERFORMING  [facility], Outpatient  cc:  ------------------------------------------------------------------- LV EF: 55%  ------------------------------------------------------------------- Indications:   I21.4 NSTEMI.  ------------------------------------------------------------------- History:  PMH: Acquired from the patient and from the patient&'s chart. PMH: NSTEMI. CHF. Atrial Fibrillation. CVA. COPD. Chest Pain. Anemia. Edema. Mental Retardation. Risk factors: Hypertension. Obese.  ------------------------------------------------------------------- Study Conclusions  - Left ventricle: The cavity size was normal. Wall thickness was increased in a pattern of mild LVH. Indeterminant diastolic function (atrial fibrillation). The estimated ejection fraction was 55%. Although no diagnostic regional wall motion abnormality was identified, this possibility cannot be completely excluded on the basis of this study. - Aortic valve: There was no stenosis. - Mitral valve: Mildly calcified annulus. There was trivial regurgitation. - Left atrium: The atrium was mildly dilated. - Right ventricle: The cavity size  was normal. Systolic function was normal. - Pulmonary arteries: No complete TR doppler jet so unable to  estimate PA systolic pressure. - Inferior vena cava: The vessel was normal in size. The respirophasic diameter changes were in the normal range (>= 50%), consistent with normal central venous pressure.  Impressions:  - The patient was in rapid atrial fibrillation. Normal LV size with mild LV hypertrophy. EF 55%. Normal RV size and systolic function. No significant valvular abnormalities.  Transthoracic echocardiography. M-mode, complete 2D, spectral Doppler, and color Doppler. Birthdate: Patient birthdate: 23-Jun-1956. Age: Patient is 60 yr old. Sex: Gender: male. BMI: 44.9 kg/m^2. Patient status: Outpatient. Study date: Study date: 12/18/2014. Study time: 02:06 PM. Location: [facility]  -------------------------------------------------------------------  ------------------------------------------------------------------- Left ventricle: The cavity size was normal. Wall thickness was increased in a pattern of mild LVH. Indeterminant diastolic function (atrial fibrillation). The estimated ejection fraction was 55%. Although no diagnostic regional wall motion abnormality was identified, this possibility cannot be completely excluded on the basis of this study.  ------------------------------------------------------------------- Aortic valve:  Trileaflet; mildly calcified leaflets. Doppler: There was no stenosis.  There was no regurgitation.  ------------------------------------------------------------------- Aorta: Aortic root: The aortic root was normal in size. Ascending aorta: The ascending aorta was normal in size.  ------------------------------------------------------------------- Mitral valve:  Mildly calcified annulus. Doppler:  There was no evidence for stenosis.  There was trivial regurgitation.  Peak gradient (D): 10 mm  Hg.  ------------------------------------------------------------------- Left atrium: The atrium was mildly dilated.  ------------------------------------------------------------------- Right ventricle: The cavity size was normal. Systolic function was normal.  ------------------------------------------------------------------- Pulmonic valve:  Structurally normal valve.  Cusp separation was normal. Doppler: Transvalvular velocity was within the normal range. There was no regurgitation.  ------------------------------------------------------------------- Tricuspid valve:  Doppler: There was no significant regurgitation.  ------------------------------------------------------------------- Pulmonary artery:  No complete TR doppler jet so unable to estimate PA systolic pressure.  ------------------------------------------------------------------- Right atrium: The atrium was normal in size.  ------------------------------------------------------------------- Pericardium: There was no pericardial effusion.  ------------------------------------------------------------------- Systemic veins: Inferior vena cava: The vessel was normal in size. The respirophasic diameter changes were in the normal range (>= 50%), consistent with normal central venous pressure.  ------------------------------------------------------------------- Measurements  Left ventricle              Value    Reference LV ID, ED, PLAX chordal         47  mm   43 - 52 LV ID, ES, PLAX chordal         35  mm   23 - 38 LV fx shortening, PLAX chordal (L)    26  %   >=29 LV PW thickness, ED           11  mm   --------- IVS/LV PW ratio, ED           1.09     <=1.3 Stroke volume, 2D            45  ml   --------- Stroke volume/bsa, 2D          21  ml/m^2 --------- LV e&', lateral               14.6 cm/s  --------- LV E/e&', lateral             10.82    --------- LV e&', medial              7.94 cm/s  --------- LV E/e&', medial             19.9     --------- LV e&', average  11.27 cm/s  --------- LV E/e&', average             14.02    ---------  Ventricular septum            Value    Reference IVS thickness, ED            12  mm   ---------  LVOT                   Value    Reference LVOT ID, S                19  mm   --------- LVOT area                2.84 cm^2  --------- LVOT mean velocity, S          80.5 cm/s  --------- LVOT VTI, S               15.7 cm   ---------  Aorta                  Value    Reference Aortic root ID, ED            33  mm   ---------  Left atrium               Value    Reference LA ID, A-P, ES              37  mm   --------- LA ID/bsa, A-P              1.71 cm/m^2 <=2.2 LA volume, S               44.2 ml   --------- LA volume/bsa, S             20.4 ml/m^2 --------- LA volume, ES, 1-p A4C          41.1 ml   --------- LA volume/bsa, ES, 1-p A4C        19  ml/m^2 --------- LA volume, ES, 1-p A2C          46.5 ml   --------- LA volume/bsa, ES, 1-p A2C        21.5 ml/m^2 ---------  Mitral valve               Value    Reference Mitral E-wave peak velocity       158  cm/s  --------- Mitral A-wave peak velocity       110  cm/s  --------- Mitral deceleration time         173  ms   150 - 230 Mitral peak gradient, D         10  mm Hg --------- Mitral E/A ratio, peak          1.44      ---------  Right ventricle             Value    Reference RV s&', lateral, S            14.3 cm/s  ---------  Legend: (L) and (H) mark values outside specified reference range.  ------------------------------------------------------------------- Prepared and Electronically Authenticated by  Loralie Champagne, M.D. 2016-05-12T21:49:17       Impression:  He has severe multi-vessel coronary artery disease with a high risk nuclear stress showing a large mostly reversible inferior, apical and inferolateral defect. I agree that combined left CEA and CABG is  indicated for prevention of stroke and MI and to improve his quality of life. I discussed the operative procedure with the patient including alternatives, benefits and risks; including but not limited to bleeding, blood transfusion, infection, stroke, myocardial infarction, graft failure, heart block requiring a permanent pacemaker, organ dysfunction, and death. [name] understands and agrees to proceed.   Plan:  We will schedule left CEA by Dr. Scot Dock  and CABG by me for 08/13/2015.   [name], MD [facility] [phone]

## 2015-08-13 NOTE — Transfer of Care (Signed)
Immediate Anesthesia Transfer of Care Note  Patient: [name]  Procedure(s) Performed: Procedure(s): CORONARY ARTERY BYPASS GRAFTING (CABG), ON PUMP, TIMES THREE, USING LEFT INTERNAL MAMMARY ARTERY, RIGHT GREATER SAPHENOUS VEIN HARVESTED ENDOSCOPICALLY (N/A) TRANSESOPHAGEAL ECHOCARDIOGRAM (TEE) (N/A) ENDARTERECTOMY CAROTID (Left)  Patient Location: SICU  Anesthesia Type:General  Level of Consciousness: sedated, unresponsive and Patient remains intubated per anesthesia plan  Airway & Oxygen Therapy: Patient remains intubated per anesthesia plan and Patient placed on Ventilator (see vital sign flow sheet for setting)  Post-op Assessment: Report given to RN and Post -op Vital signs reviewed and stable  Post vital signs: Reviewed and stable  Last Vitals:  Filed Vitals:   08/13/15 0549 08/13/15 1415  BP: 141/62 122/67  Pulse: 60 80  Temp: 36.5 C   Resp: 18 12    Complications: No apparent anesthesia complications

## 2015-08-13 NOTE — Brief Op Note (Signed)
08/13/2015  12:12 PM  PATIENT:  [name]  60 y.o. male  PRE-OPERATIVE DIAGNOSIS:  CAD  POST-OPERATIVE DIAGNOSIS:  CAD  PROCEDURE: TRANSESOPHAGEAL ECHOCARDIOGRAM (TEE), MEDIAN STERNOTOMY for  CORONARY ARTERY BYPASS GRAFTING (CABG) x 3 (LIMA to LAD, SVG to OM1, and SVG to OM2) USING LEFT INTERNAL MAMMARY ARTERY, RIGHT GREATER SAPHENOUS VEIN HARVESTED ENDOSCOPICALLY  LEFT ENDARTERECTOMY CAROTID (with patch)  SURGEON:  Surgeon(s) and Role: Panel 1:    * [name], MD - Primary  Panel 2:    * Angelia Mould, MD - Primary  PHYSICIAN ASSISTANT: Aldona Bar Rhyne PA-C and Lars Pinks PA-C  ANESTHESIA:   general  EBL:  Total I/O In: 1600 [I.V.:1600] Out: 1250 [Urine:1250]  DRAINS: Chest tubes placed in the mediastinal and pleural spaces   COUNTS CORRECT:  YES  DICTATION: .Dragon Dictation  PLAN OF CARE: Admit to inpatient   PATIENT DISPOSITION:  ICU - intubated and hemodynamically stable.   Delay start of Pharmacological VTE agent (>24hrs) due to surgical blood loss or risk of bleeding: yes  BASELINE WEIGHT: 92 kg

## 2015-08-13 NOTE — Anesthesia Procedure Notes (Signed)
Procedure Name: Intubation Date/Time: 08/13/2015 7:51 AM Performed by: Kyung Rudd Pre-anesthesia Checklist: Patient identified, Emergency Drugs available, Suction available, Patient being monitored and Timeout performed Patient Re-evaluated:Patient Re-evaluated prior to inductionOxygen Delivery Method: Circle system utilized Preoxygenation: Pre-oxygenation with 100% oxygen Intubation Type: IV induction Ventilation: Mask ventilation without difficulty and Oral airway inserted - appropriate to patient size Laryngoscope Size: Mac and 3 Grade View: Grade I Tube type: Oral Tube size: 8.0 mm Number of attempts: 1 Airway Equipment and Method: Stylet Placement Confirmation: ETT inserted through vocal cords under direct vision,  positive ETCO2 and breath sounds checked- equal and bilateral Secured at: 21 cm Tube secured with: Tape Dental Injury: Teeth and Oropharynx as per pre-operative assessment

## 2015-08-13 NOTE — Progress Notes (Signed)
   VASCULAR SURGERY ASSESSMENT & PLAN:  * Stable post op.  SUBJECTIVE: Arousalable   PHYSICAL EXAM: Filed Vitals:   08/13/15 1515 08/13/15 1600 08/13/15 1700 08/13/15 1705  BP: 91/63 95/65 84/58  84/60  Pulse: 80 80 80 80  Temp: 96.6 F (35.9 C) 97.2 F (36.2 C) 97.9 F (36.6 C) 97.9 F (36.6 C)  TempSrc:  Core (Comment)    Resp: 12 12 12 12   Height:      Weight:      SpO2: 100% 100% 100% 100%   Moves all extremities.  Dresssing left neck dry Minimal drainage from JP  LABS: Lab Results  Component Value Date   WBC 12.9* 08/13/2015   HGB 9.5* 08/13/2015   HCT 28.0* 08/13/2015   MCV 80.6 08/13/2015   PLT 204 08/13/2015   Lab Results  Component Value Date   CREATININE 0.70 08/13/2015   Lab Results  Component Value Date   INR 1.37 08/13/2015   CBG (last 3)   Recent Labs  08/11/15 1058 08/13/15 0601  GLUCAP 94 207*    Active Problems:   CAD (coronary artery disease)    [name] Beeper: [id number] 08/13/2015

## 2015-08-13 NOTE — Progress Notes (Signed)
Patient ID: [name], male   DOB: [birth year], 60 y.o.   MRN: [id number]  SICU Evening Rounds:   Hemodynamically stable  CI = 1.8  Has started to wake up on vent. Moves all extremities to command   Urine output good  CT output low  CBC    Component Value Date/Time   WBC 12.9* 08/13/2015 1420   RBC 3.56* 08/13/2015 1420   HGB 9.5* 08/13/2015 1424   HCT 28.0* 08/13/2015 1424   PLT 204 08/13/2015 1420   MCV 80.6 08/13/2015 1420   MCH 26.1 08/13/2015 1420   MCHC 32.4 08/13/2015 1420   RDW 13.9 08/13/2015 1420   LYMPHSABS 1.6 05/19/2015 0803   MONOABS 0.6 05/19/2015 0803   EOSABS 0.4 05/19/2015 0803   BASOSABS 0.0 05/19/2015 0803     BMET    Component Value Date/Time   NA 139 08/13/2015 1424   K 3.3* 08/13/2015 1424   CL 92* 08/13/2015 1256   CO2 28 08/11/2015 1209   GLUCOSE 124* 08/13/2015 1424   BUN 29* 08/13/2015 1256   CREATININE 0.70 08/13/2015 1256   CREATININE 1.46* 08/04/2015 0828   CALCIUM 9.1 08/11/2015 1209   GFRNONAA 57* 08/11/2015 1209   GFRAA >60 08/11/2015 1209     A/P:  Stable postop course. Continue current plans

## 2015-08-13 NOTE — Anesthesia Postprocedure Evaluation (Signed)
Anesthesia Post Note  Patient: [name]  Procedure(s) Performed: Procedure(s) (LRB): CORONARY ARTERY BYPASS GRAFTING (CABG), ON PUMP, TIMES THREE, USING LEFT INTERNAL MAMMARY ARTERY, RIGHT GREATER SAPHENOUS VEIN HARVESTED ENDOSCOPICALLY (N/A) TRANSESOPHAGEAL ECHOCARDIOGRAM (TEE) (N/A) ENDARTERECTOMY CAROTID (Left)  Patient location during evaluation: SICU Anesthesia Type: General Level of consciousness: sedated and patient remains intubated per anesthesia plan Pain management: pain level controlled Vital Signs Assessment: vitals unstable and post-procedure vital signs reviewed and stable Respiratory status: patient on ventilator - see flowsheet for VS Anesthetic complications: no    Last Vitals:  Filed Vitals:   08/13/15 1500 08/13/15 1515  BP:  91/63  Pulse: 80 80  Temp: 35.8 C 35.9 C  Resp: 12 12    Last Pain: There were no vitals filed for this visit.               Earline Stiner COKER

## 2015-08-13 NOTE — Addendum Note (Signed)
Addendum  created 08/13/15 1607 by Roberts Gaudy, MD   Modules edited: Notes Section   Notes Section:  File: [id number]; Pend: [id number]; Raelyn Number: [id number]; Raelyn Number: [id number]; Pend: [id number]

## 2015-08-13 NOTE — Progress Notes (Signed)
Family not in waiting room at this time, have not come back yet to see pt.   Henreitta Leber, RN 5:11 PM 08/13/2015

## 2015-08-13 NOTE — OR Nursing (Signed)
Twenty minute call to SICU at 1323. Spoke with Tamela Oddi.

## 2015-08-13 NOTE — Progress Notes (Signed)
Patient decreased to rate of 4 and 40%.

## 2015-08-13 NOTE — Progress Notes (Signed)
Precedex off at 18:05, pt responds to voice but very drowsy, not initiating own breaths unless stimulated. Will continue to monitor.  Henreitta Leber, RN 6:47 PM 08/13/2015

## 2015-08-13 NOTE — Progress Notes (Signed)
  Echocardiogram Echocardiogram Transesophageal has been performed.  Bobbye Charleston 08/13/2015, 12:32 PM

## 2015-08-13 NOTE — CV Procedure (Signed)
Intra-operative Transesophageal Echocardiography Report:  [name] is a 60 year old male with a history of type 2 diabetes, hypertension, paroxysmal atrial fibrillation, previous smoking and  previous stroke. He had been followed by Dr. Doren Custard for left carotid stenosis. He also suffered a non-STEMI in March 2016 and was found to have diffuse multivessel coronary artery disease. He is now scheduled to undergo left carotid endarterectomy and coronary artery bypass grafting by Dr. Doren Custard and Cyndia Bent. Intraoperative transesophageal echocardiography was indicated to evaluate the left and right ventricular function, to serve as a monitor for intraoperative volume status and intracardiac air, and to assess for any valvular pathology.  The patient was brought to the operating room at [facility] and general anesthesia was induced without difficulty. Following endotracheal intubation and orogastric suctioning, the transesophageal echocardiography probe was inserted into the esophagus without difficulty.  Impression: Pre-bypass findings:  1. Aortic valve: Aortic valve leaflets were mildly thickened but opened normally without restriction. There was no aortic insufficiency.  2. Mitral valve: There was mild to moderate mitral annular calcification. However the leaflets opened normally and there was normal leaflet coaptation and no mitral regurgitation noted.  3. Left ventricle: The left ventricular cavity was of normal size. There was normal appearing left ventricular systolic function. The LV  ejection fraction was estimated at 55-60%. Left ventricular end-diastolic diameter was 4.2 cm. Left ventricular wall thickness was 0.95 cm in the posterior wall and 1.05 cm the anterior wall at the mid-papillary level at end diastole. There were no regional wall motion abnormalities.  4. Right ventricle: The right ventricular cavity was of normal size. There was normal contractility of the right ventricular free  wall and normal tricuspid annular plane systolic excursion.  5. Tricuspid valve: The tricuspid valve appeared structurally normal and there was trace tricuspid insufficiency.  6. Interatrial septum: The interatrial septum was intact without evidence of patent foramen ovale or atrial septal defect by color Doppler or bubble study.  7. Left atrium: There was no thrombus noted within left atrium or left atrial appendage.  8. Ascending aorta: The walls of the ascending aorta were mildly thickened but no protruding atheromata were noted. There was a well-defined aortic root and sinotubular ridge without effacement or dilatation.  9. Descending aorta: The descending aorta showed no significant atheromatous disease and measured 1.78 cm in diameter.  Post-bypass findings:  1. Aortic valve: The aortic valve appeared unchanged from the pre-bypass study. The leaflets  opened normally and there was no aortic insufficiency.  2. Mitral valve: The mitral valve was unchanged from the pre-bypass study. There was no mitral insufficiency noted.  3. Left ventricle: There was a moderate amount of air noted in the left ventricular cavity upon separation cardiopulmonary bypass. With the usual de-airing maneuvers the air was expelled over the next 5 minutes. No residual intracardiac air could be appreciated 20 minutes following separation from cardiopulmonary bypass. There was normal appearing systolic function of the left ventricle.  4. Right ventricle: The right ventricular cavity was normal size with normal appearing right ventricular function.  Roberts Gaudy

## 2015-08-13 NOTE — Progress Notes (Signed)
Pt responding to voice, able to move all extremities on command.  Henreitta Leber, RN 4:19 PM 08/13/2015

## 2015-08-13 NOTE — Anesthesia Preprocedure Evaluation (Signed)
Anesthesia Evaluation  Patient identified by MRN, date of birth, ID band Patient awake    Reviewed: Allergy & Precautions, NPO status , Patient's Chart, lab work & pertinent test results  Airway Mallampati: II  TM Distance: >3 FB Neck ROM: Full    Dental  (+) Teeth Intact   Pulmonary former smoker,    breath sounds clear to auscultation       Cardiovascular hypertension,  Rhythm:Regular Rate:Normal     Neuro/Psych    GI/Hepatic   Endo/Other  diabetes  Renal/GU      Musculoskeletal   Abdominal (+) + obese,   Peds  Hematology   Anesthesia Other Findings   Reproductive/Obstetrics                             Anesthesia Physical Anesthesia Plan  ASA: III  Anesthesia Plan: General   Post-op Pain Management:    Induction: Intravenous  Airway Management Planned: Oral ETT  Additional Equipment: Arterial line, CVP, PA Cath and 3D TEE  Intra-op Plan:   Post-operative Plan: Post-operative intubation/ventilation  Informed Consent: I have reviewed the patients History and Physical, chart, labs and discussed the procedure including the risks, benefits and alternatives for the proposed anesthesia with the patient or authorized representative who has indicated his/her understanding and acceptance.     Plan Discussed with:   Anesthesia Plan Comments:         Anesthesia Quick Evaluation

## 2015-08-13 NOTE — H&P (View-Only) (Signed)
[facility] [place]  Patient name: [name] MRN: [id number] DOB: 28-Oct-1955 Sex: male  REASON FOR VISIT: Patient is scheduled for a left carotid endarterectomy combined with coronary revascularization on 08/13/2015.  HPI: [name] is a 60 y.o. male, who I last saw in the office on 05/06/2015. He has known bilateral carotid stenoses and the left carotid stenosis had progressed to greater than 80%. He has a 60-79% right carotid stenosis. The patient had been admitted in March with congestive heart failure and had a non-ST MI at that time. He had an abnormal nuclear stress test which prompted cardiac catheterization and he was found to have severe diffuse heavily calcified three-vessel coronary disease. We were considering combined CABG and left carotid endarterectomy. However when he presented for surgery his blood sugar was very poorly controlled and his surgery was canceled. His blood sugar is now under better control and he presents for evaluation prior to proceeding with combined left carotid endarterectomy and CABG.   Since I saw him last,he denies any history of stroke, TIAs, expressive or receptive aphasia, or amaurosis fugax. He does have occasional chest pressure on the right side but this has been stable.  I reviewed Dr. Jeryl Columbia note from his recent visit. Given that his diabetes is under better control with his hemoglobin A1c down to 6.8, the patient appears to be in better condition to proceed with combined left carotid endarterectomy and CABG.  Past Medical History  Diagnosis Date  . Hypertension   . Mental retardation     Sister helps to take care of him and takes him to appts  . Tobacco user     Smokes 1ppd for multiple years.  Quit after hosp 09/2010.  Marland Kitchen Anemia     History of Iron Def Anemia  . Anxiety   . Hyperlipidemia   . PAF (paroxysmal atrial fibrillation) ([place]) 06/2009    CHADS score 2 (HTN, DM), was not on coumadin, but now  on Pradaxa for Afib  . Lung nodule   . Chronic low back pain   . CVA (cerebral infarction) 09/2010    Bilateral with Left > Right  . Abdominal hernia     Chronic, not a good surgical candidate  . Carotid artery occlusion   . Obesity   . COPD (chronic obstructive pulmonary disease) ([place])   . Abscess, abdomen ([place]) 12/31/2010    Referred to Wound Care in 01/2011 because of multiple abd abscess with VERY large ventral hernia (please look at image of CT abd/pelvis 09/2010).  Because of hernia I was hesitant to I&D.      Marland Kitchen GERD (gastroesophageal reflux disease)   . Pneumonia     hx of  . Chronic diastolic CHF (congestive heart failure) (H[facility])     takes Lasix  . Itching     all over body; pt scratches and has sores on bilateral arms and abdomen  . Hx of echocardiogram     Echo 5/16:  Mild LVH, EF 55%, indeterm. diast function, WMA could not be ruled out, MAC, trivial MR, mild LAE, normal RVF  . Myocardial infarction [facility]) 2016 ?    Heart attack  (  Per  pt. )  . History of nuclear stress test     Myoview 9/16:  Inferior, apical and inf-lateral ischemia; not gated; High Risk  . Shortness of breath dyspnea     with exertion  . Diabetes mellitus   . Stroke ([place])   .  Oxygen dependent     wears 2 liters at bedtime and when needed    Family History  Problem Relation Age of Onset  . Heart disease Mother   . Hypertension Sister   . Heart disease Brother   . Heart disease Father   . Peripheral vascular disease Father   . Heart disease Maternal Grandmother   . Heart attack Maternal Grandmother   . Breast cancer Maternal Grandmother   . Colon cancer Neg Hx   . Stroke Neg Hx   . Stomach cancer Maternal Uncle     SOCIAL HISTORY: Social History   Social History  . Marital Status: Single    Spouse Name: N/A  . Number of Children: 0  . Years of Education: N/A   Occupational History  .     Social History Main Topics  . Smoking status: Former Smoker -- 1.00 packs/day for 42 years     Types: Cigarettes    Quit date: 11/10/2010  . Smokeless tobacco: Former Systems developer    Quit date: 10/02/2010  . Alcohol Use: 0.0 oz/week    0 Standard drinks or equivalent per week     Comment: rare  . Drug Use: No  . Sexual Activity: Not on file   Other Topics Concern  . Not on file   Social History Narrative   Released from prison in 2003 after serving 2 years for indecency with a minor.    He smokes cigarettes greater than 30 years, also smoked a pipe.  Quit tobacco 09/2010.   Denies EtOH and drugs.     Allergies  Allergen Reactions  . Penicillins Hives and Nausea And Vomiting    Current Outpatient Prescriptions  Medication Sig Dispense Refill  . aspirin EC 81 MG EC tablet Take 1 tablet (81 mg total) by mouth daily. 90 tablet 3  . atorvastatin (LIPITOR) 80 MG tablet Take 1 tablet (80 mg total) by mouth daily at 6 PM. 45 tablet 0  . Cholecalciferol (VITAMIN D3) 1000 UNITS CAPS Take 1,000 Units by mouth daily.    . clonazePAM (KLONOPIN) 0.5 MG tablet Take 1 tablet (0.5 mg total) by mouth at bedtime. 30 tablet 0  . dabigatran (PRADAXA) 150 MG CAPS capsule Take 1 capsule (150 mg total) by mouth every 12 (twelve) hours. 60 capsule 11  . ferrous gluconate (FERGON) 324 MG tablet Take 1 tablet (324 mg total) by mouth 2 (two) times daily with a meal. 60 tablet 0  . furosemide (LASIX) 80 MG tablet Take 1 tablet (80 mg total) by mouth 2 (two) times daily. 60 tablet 0  . gabapentin (NEURONTIN) 300 MG capsule Take 1 capsule (300 mg total) by mouth 2 (two) times daily. 60 capsule 0  . HYDROcodone-acetaminophen (NORCO/VICODIN) 5-325 MG per tablet 1 by mouth once daily as needed for pain control 30 tablet 0  . hydrocortisone 2.5 % ointment Apply topically 3 (three) times daily. To leg around wound. Do not apply to broken skin. 30 g 0  . insulin glargine (LANTUS) 100 UNIT/ML injection Inject 60 Units into the skin at bedtime.     . isosorbide mononitrate (IMDUR) 30 MG 24 hr tablet Take 30 mg by mouth  every morning.    Marland Kitchen losartan (COZAAR) 25 MG tablet Take 1 tablet (25 mg total) by mouth daily. 31 tablet 11  . magnesium oxide (MAG-OX) 400 MG tablet Take 400 mg by mouth daily.    . metFORMIN (GLUCOPHAGE) 1000 MG tablet Take 1,000 mg by mouth 2 (  two) times daily with a meal.    . metoprolol succinate (TOPROL-XL) 50 MG 24 hr tablet Take 1 tablet (50 mg total) by mouth 2 (two) times daily. Take with or immediately following a meal.    . nitroGLYCERIN (NITROSTAT) 0.4 MG SL tablet Place 0.4 mg under the tongue every 5 (five) minutes as needed for chest pain.     Marland Kitchen omeprazole (PRILOSEC) 20 MG capsule Take 20 mg by mouth daily.    Marland Kitchen PARoxetine (PAXIL) 40 MG tablet Take 40 mg by mouth every morning.    . saccharomyces boulardii (FLORASTOR) 250 MG capsule Take 1 capsule (250 mg total) by mouth 2 (two) times daily. 60 capsule 2   No current facility-administered medications for this visit.    REVIEW OF SYSTEMS:  [X]  denotes positive finding, [ ]  denotes negative finding Cardiac  Comments:  Chest pain or chest pressure: X   Shortness of breath upon exertion: X   Short of breath when lying flat:    Irregular heart rhythm:        Vascular    Pain in calf, thigh, or hip brought on by ambulation:    Pain in feet at night that wakes you up from your sleep:     Blood clot in your veins:    Leg swelling:         Pulmonary    Oxygen at home:    Productive cough:     Wheezing:         Neurologic    Sudden weakness in arms or legs:     Sudden numbness in arms or legs:     Sudden onset of difficulty speaking or slurred speech:    Temporary loss of vision in one eye:     Problems with dizziness:         Gastrointestinal    Blood in stool:     Vomited blood:         Genitourinary    Burning when urinating:     Blood in urine:        Psychiatric    Major depression:         Hematologic    Bleeding problems:    Problems with blood clotting too easily:        Skin    Rashes or ulcers:         Constitutional    Fever or chills:      PHYSICAL EXAM: There were no vitals filed for this visit.  GENERAL: The patient is a well-nourished male, in no acute distress. The vital signs are documented above. CARDIAC: There is a regular rate and rhythm.  VASCULAR: he has a left carotid bruit. PULMONARY: There is good air exchange bilaterally without wheezing or rales. ABDOMEN: Soft and non-tender with normal pitched bowel sounds.  MUSCULOSKELETAL: There are no major deformities or cyanosis. NEUROLOGIC: No focal weakness or paresthesias are detected. SKIN: There are no ulcers or rashes noted. PSYCHIATRIC: The patient has a normal affect.  DATA:   CAROTID DUPLEX: I have again reviewed his carotid duplex scan from 04/08/2015. This shows a greater than 80% left carotid stenosis. Peak systolic velocity is 123456 cm/s with an end-diastolic velocity [id number] cm/s. There is a 60-79% right carotid stenosis.   I did review his follow up duplex scan that was done on 05/15/2015. On the left side in the proximal internal carotid artery peak systolic velocity was 99991111 cm/s with an end-diastolic velocity of 123456 cm/s suggesting  a very tight greater than 90% left carotid stenosis. On the right side there was a 40-59% stenosis in the higher end of that range.  MEDICAL ISSUES:  ASYMPTOMATIC GREATER THAN 80% LEFT CAROTID STENOSIS AND CORONARY ARTERY DISEASE: Given the severity of the left carotid stenosis I agree with the plan to proceed with combined left carotid endarterectomy and coronary revascularization. This has been scheduled for 08/13/2015. I have reviewed the indications for carotid endarterectomy, that is to lower the risk of future stroke. I have also reviewed the potential complications of surgery, including but not limited to: bleeding, stroke (perioperative risk 1-2%), MI, nerve injury of other unpredictable medical problems. All of the patients questions were answered and they are agreeable to  proceed with surgery.     [name] [facility] [place]: [phone]

## 2015-08-13 NOTE — Interval H&P Note (Signed)
History and Physical Interval Note:  08/13/2015 7:05 AM  [name]  has presented today for surgery, with the diagnosis of CAD  The various methods of treatment have been discussed with the patient and family. After consideration of risks, benefits and other options for treatment, the patient has consented to  Procedure(s): CORONARY ARTERY BYPASS GRAFTING (CABG) (N/A) TRANSESOPHAGEAL ECHOCARDIOGRAM (TEE) (N/A) ENDARTERECTOMY CAROTID (Left) as a surgical intervention .  The patient's history has been reviewed, patient examined, no change in status, stable for surgery.  I have reviewed the patient's chart and labs.  Questions were answered to the patient's satisfaction.     [name]

## 2015-08-13 NOTE — OR Nursing (Signed)
First call to SICU charge nurse at 1253.

## 2015-08-14 ENCOUNTER — Encounter (HOSPITAL_COMMUNITY): Payer: Self-pay | Admitting: Surgery

## 2015-08-14 ENCOUNTER — Inpatient Hospital Stay (HOSPITAL_COMMUNITY): Payer: Medicare (Managed Care)

## 2015-08-14 LAB — BASIC METABOLIC PANEL
ANION GAP: 8 (ref 5–15)
BUN: 19 mg/dL (ref 6–20)
CHLORIDE: 106 mmol/L (ref 101–111)
CO2: 30 mmol/L (ref 22–32)
Calcium: 8.9 mg/dL (ref 8.9–10.3)
Creatinine, Ser: 0.95 mg/dL (ref 0.61–1.24)
GFR calc non Af Amer: >60 mL/min (ref >60)
Glucose, Bld: 100 mg/dL — ABNORMAL HIGH (ref 65–99)
Potassium: 3.9 mmol/L (ref 3.5–5.1)
Sodium: 144 mmol/L (ref 135–145)

## 2015-08-14 LAB — CBC
HCT: 25.9 % — ABNORMAL LOW (ref 39.0–52.0)
HEMATOCRIT: 27.7 % — AB (ref 39.0–52.0)
HEMOGLOBIN: 9.1 g/dL — AB (ref 13.0–17.0)
Hemoglobin: 8.2 g/dL — ABNORMAL LOW (ref 13.0–17.0)
MCH: 26.9 pg (ref 26.0–34.0)
MCH: 26.4 pg (ref 26.0–34.0)
MCHC: 32.9 g/dL (ref 30.0–36.0)
MCHC: 31.7 g/dL (ref 30.0–36.0)
MCV: 82 fL (ref 78.0–100.0)
MCV: 83.3 fL (ref 78.0–100.0)
PLATELETS: 181 10*3/uL (ref 150–400)
Platelets: 245 10*3/uL (ref 150–400)
RBC: 3.38 MIL/uL — ABNORMAL LOW (ref 4.22–5.81)
RBC: 3.11 MIL/uL — ABNORMAL LOW (ref 4.22–5.81)
RDW: 14.4 % (ref 11.5–15.5)
RDW: 14.6 % (ref 11.5–15.5)
WBC: 10.8 10*3/uL — AB (ref 4.0–10.5)
WBC: 9.9 10*3/uL (ref 4.0–10.5)

## 2015-08-14 LAB — GLUCOSE, CAPILLARY
GLUCOSE-CAPILLARY: 105 mg/dL — AB (ref 65–99)
GLUCOSE-CAPILLARY: 128 mg/dL — AB (ref 65–99)
GLUCOSE-CAPILLARY: 101 mg/dL — AB (ref 65–99)
GLUCOSE-CAPILLARY: 97 mg/dL (ref 65–99)
GLUCOSE-CAPILLARY: 109 mg/dL — AB (ref 65–99)
GLUCOSE-CAPILLARY: 132 mg/dL — AB (ref 65–99)
GLUCOSE-CAPILLARY: 109 mg/dL — AB (ref 65–99)
GLUCOSE-CAPILLARY: 113 mg/dL — AB (ref 65–99)
GLUCOSE-CAPILLARY: 78 mg/dL (ref 65–99)
GLUCOSE-CAPILLARY: 96 mg/dL (ref 65–99)
Glucose-Capillary: 119 mg/dL — ABNORMAL HIGH (ref 65–99)
Glucose-Capillary: 118 mg/dL — ABNORMAL HIGH (ref 65–99)
Glucose-Capillary: 145 mg/dL — ABNORMAL HIGH (ref 65–99)
Glucose-Capillary: 151 mg/dL — ABNORMAL HIGH (ref 65–99)
Glucose-Capillary: 94 mg/dL (ref 65–99)
Glucose-Capillary: 99 mg/dL (ref 65–99)
Glucose-Capillary: 138 mg/dL — ABNORMAL HIGH (ref 65–99)
Glucose-Capillary: 88 mg/dL (ref 65–99)

## 2015-08-14 LAB — MAGNESIUM
Magnesium: 2.5 mg/dL — ABNORMAL HIGH (ref 1.7–2.4)
Magnesium: 2.2 mg/dL (ref 1.7–2.4)

## 2015-08-14 LAB — POCT I-STAT, CHEM 8
BUN: 21 mg/dL — ABNORMAL HIGH (ref 6–20)
CHLORIDE: 99 mmol/L — AB (ref 101–111)
Calcium, Ion: 1.22 mmol/L (ref 1.13–1.30)
Creatinine, Ser: 0.9 mg/dL (ref 0.61–1.24)
GLUCOSE: 107 mg/dL — AB (ref 65–99)
HEMATOCRIT: 26 % — AB (ref 39.0–52.0)
HEMOGLOBIN: 8.8 g/dL — AB (ref 13.0–17.0)
POTASSIUM: 3.6 mmol/L (ref 3.5–5.1)
SODIUM: 142 mmol/L (ref 135–145)
TCO2: 30 mmol/L (ref 0–100)

## 2015-08-14 LAB — CREATININE, SERUM
Creatinine, Ser: 0.99 mg/dL (ref 0.61–1.24)
GFR calc Af Amer: >60 mL/min (ref >60)
GFR calc non Af Amer: >60 mL/min (ref >60)

## 2015-08-14 MED ORDER — INSULIN DETEMIR 100 UNIT/ML ~~LOC~~ SOLN
20.0000 [IU] | Freq: Every day | SUBCUTANEOUS | Status: DC
  Administered 2015-08-14: 20 [IU] via SUBCUTANEOUS
  Filled 2015-08-14 (×2): qty 0.2

## 2015-08-14 MED ORDER — INSULIN DETEMIR 100 UNIT/ML ~~LOC~~ SOLN: 20.0000 [IU] | Freq: Every day | SUBCUTANEOUS | Status: DC

## 2015-08-14 MED ORDER — POTASSIUM CHLORIDE 10 MEQ/50ML IV SOLN
10.0000 meq | INTRAVENOUS | Status: AC
  Administered 2015-08-14 (×3): 10 meq via INTRAVENOUS
  Filled 2015-08-14: qty 50

## 2015-08-14 MED ORDER — AMIODARONE HCL IN DEXTROSE 360-4.14 MG/200ML-% IV SOLN
60.0000 mg/h | INTRAVENOUS | Status: AC
  Administered 2015-08-14 (×2): 60 mg/h via INTRAVENOUS
  Filled 2015-08-14 (×2): qty 200

## 2015-08-14 MED ORDER — ENOXAPARIN SODIUM 40 MG/0.4ML ~~LOC~~ SOLN
40.0000 mg | Freq: Every day | SUBCUTANEOUS | Status: AC
  Administered 2015-08-14 – 2015-08-18 (×5): 40 mg via SUBCUTANEOUS
  Filled 2015-08-14 (×5): qty 0.4

## 2015-08-14 MED ORDER — CETYLPYRIDINIUM CHLORIDE 0.05 % MT LIQD
7.0000 mL | Freq: Two times a day (BID) | OROMUCOSAL | Status: DC
  Administered 2015-08-14 – 2015-08-19 (×10): 7 mL via OROMUCOSAL

## 2015-08-14 MED ORDER — INSULIN ASPART 100 UNIT/ML ~~LOC~~ SOLN: 0.0000 [IU] | SUBCUTANEOUS | Status: DC

## 2015-08-14 MED ORDER — AMIODARONE HCL IN DEXTROSE 360-4.14 MG/200ML-% IV SOLN
30.0000 mg/h | INTRAVENOUS | Status: AC
  Administered 2015-08-14 – 2015-08-15 (×2): 30 mg/h via INTRAVENOUS
  Filled 2015-08-14 (×2): qty 200

## 2015-08-14 MED ORDER — AMIODARONE LOAD VIA INFUSION
150.0000 mg | Freq: Once | INTRAVENOUS | Status: AC
  Administered 2015-08-14: 150 mg via INTRAVENOUS
  Filled 2015-08-14: qty 83.34

## 2015-08-14 MED FILL — Heparin Sodium (Porcine) Inj 1000 Unit/ML: INTRAMUSCULAR | Qty: 30 | Status: AC

## 2015-08-14 MED FILL — Mannitol IV Soln 20%: INTRAVENOUS | Qty: 500 | Status: AC

## 2015-08-14 MED FILL — Heparin Sodium (Porcine) Inj 1000 Unit/ML: INTRAMUSCULAR | Qty: 10 | Status: AC

## 2015-08-14 MED FILL — Lidocaine HCl IV Inj 20 MG/ML: INTRAVENOUS | Qty: 5 | Status: AC

## 2015-08-14 MED FILL — Electrolyte-R (PH 7.4) Solution: INTRAVENOUS | Qty: 4000 | Status: AC

## 2015-08-14 MED FILL — Magnesium Sulfate Inj 50%: INTRAMUSCULAR | Qty: 10 | Status: AC

## 2015-08-14 MED FILL — Potassium Chloride Inj 2 mEq/ML: INTRAVENOUS | Qty: 40 | Status: AC

## 2015-08-14 MED FILL — Sodium Bicarbonate IV Soln 8.4%: INTRAVENOUS | Qty: 50 | Status: AC

## 2015-08-14 MED FILL — Sodium Chloride IV Soln 0.9%: INTRAVENOUS | Qty: 2000 | Status: AC

## 2015-08-14 NOTE — Progress Notes (Signed)
Utilization Review Completed.[name] T1/01/2016  

## 2015-08-14 NOTE — Procedures (Signed)
Extubation Procedure Note  Patient Details:   Name: RONIT HAUGHN DOB: Nov 11, 1955 MRN: [id number]   Airway Documentation:     Evaluation  O2 sats: stable throughout Complications: No apparent complications Patient did tolerate procedure well. Bilateral Breath Sounds: Clear   Yes   Pt. Was extubated to a 4L [place] without any complications, dyspnea or stridor noted. Pt. Was instructed on IS x 5, highest goal achieved was 6103mL.  Lin Hackmann, Eddie North 08/14/2015, 7:48, AM

## 2015-08-14 NOTE — Progress Notes (Signed)
Patient returned to rate of 12 due to decreased rate and periods of apnea. RT will continue to monitor.

## 2015-08-14 NOTE — Progress Notes (Signed)
   VASCULAR SURGERY ASSESSMENT & PLAN:  * 1 Day Post-Op s/p: Combined L CEA/ CABG  *  Doing well  SUBJECTIVE: Pain well controlled.  PHYSICAL EXAM: Filed Vitals:   08/14/15 1000 08/14/15 1100 08/14/15 1147 08/14/15 1200  BP: 107/58 101/65  103/63  Pulse: 96 96  76  Temp:   97.8 F (36.6 C)   TempSrc:   Oral   Resp: 12 14  24   Height:      Weight:      SpO2: 100% 100%  100%   Dressing dry Neuro intact JP 30 cc over night.  LABS: Lab Results  Component Value Date   WBC 10.8* 08/14/2015   HGB 9.1* 08/14/2015   HCT 27.7* 08/14/2015   MCV 82.0 08/14/2015   PLT 245 08/14/2015   Lab Results  Component Value Date   CREATININE 0.95 08/14/2015   CBG (last 3)   Recent Labs  08/14/15 0201 08/14/15 0301 08/14/15 0359  GLUCAP 119* 101* 97    Active Problems:   CAD (coronary artery disease)   Gae Gallop Beeper: [id number] 08/14/2015

## 2015-08-14 NOTE — Progress Notes (Addendum)
[facility] BRIEF SICU PROGRESS NOTE  1 Day Post-Op  S/P Procedure(s) (LRB): CORONARY ARTERY BYPASS GRAFTING (CABG), ON PUMP, TIMES THREE, USING LEFT INTERNAL MAMMARY ARTERY, RIGHT GREATER SAPHENOUS VEIN HARVESTED ENDOSCOPICALLY (N/A) TRANSESOPHAGEAL ECHOCARDIOGRAM (TEE) (N/A) ENDARTERECTOMY CAROTID (Left)   Stable day NSR at present but has been in and out of Afib BP stable off Neo O2 sats 98% on 2 L/min UOP adequate Labs okay w/ Hgb down some to 8.2   Plan: Continue current plan  Rexene Alberts, MD 08/14/2015 5:48 PM

## 2015-08-14 NOTE — Progress Notes (Signed)
1 Day Post-Op Procedure(s) (LRB): CORONARY ARTERY BYPASS GRAFTING (CABG), ON PUMP, TIMES THREE, USING LEFT INTERNAL MAMMARY ARTERY, RIGHT GREATER SAPHENOUS VEIN HARVESTED ENDOSCOPICALLY (N/A) TRANSESOPHAGEAL ECHOCARDIOGRAM (TEE) (N/A) ENDARTERECTOMY CAROTID (Left) Subjective:  Intubated but alert and following commands. Was not breathing on his own enough overnight to extubate.  Objective: Vital signs in last 24 hours: Temp:  [96.4 F (35.8 C)-101.3 F (38.5 C)] 100 F (37.8 C) (01/06 0700) Pulse Rate:  [35-132] 91 (01/06 0700) Cardiac Rhythm:  [-] Atrial fibrillation (01/06 0700) Resp:  [12-25] 14 (01/06 0700) BP: (84-134)/(57-84) 107/65 mmHg (01/06 0700) SpO2:  [100 %] 100 % (01/06 0700) FiO2 (%):  [40 %-50 %] 40 % (01/06 0700) Weight:  [94.348 kg (208 lb)] 94.348 kg (208 lb) (01/06 0500)  Hemodynamic parameters for last 24 hours: PAP: (20-34)/(9-18) 23/14 mmHg CO:  [3.4 L/min-4.8 L/min] 4.1 L/min CI:  [1.8 L/min/m2-2.6 L/min/m2] 2.2 L/min/m2  Intake/Output from previous day: 01/05 0701 - 01/06 0700 In: 5285.3 [I.V.:3255.3; Blood:340; NG/GT:30; IV Piggyback:1650] Out: [id number] [Urine:5015; Emesis/NG output:250; Drains:30; Blood:1575; Chest Tube:260] Intake/Output this shift:    General appearance: alert and cooperative Neurologic: intact Heart: irregularly irregular rhythm Lungs: clear to auscultation bilaterally Extremities: edema mild Wound: dressings dry  Lab Results:  Recent Labs  08/13/15 2102 08/13/15 2105 08/14/15 0400  WBC 11.2*  --  10.8*  HGB 9.0* 9.2* 9.1*  HCT 28.0* 27.0* 27.7*  PLT 209  --  245   BMET:  Recent Labs  08/11/15 1209  08/13/15 2105 08/14/15 0400  NA 130*  < > 140 144  K 3.4*  < > 4.3 3.9  CL 87*  < > 100* 106  CO2 28  --   --  30  GLUCOSE 116*  < > 148* 100*  BUN 39*  < > 23* 19  CREATININE 1.32*  < > 0.90 0.95  CALCIUM 9.1  --   --  8.9  < > = values in this interval not displayed.  PT/INR:  Recent Labs  08/13/15 1420   LABPROT 17.0*  INR 1.37   ABG    Component Value Date/Time   PHART 7.492* 08/13/2015 1426   HCO3 30.9* 08/13/2015 1426   TCO2 29 08/13/2015 2105   O2SAT 98.0 08/13/2015 1426   CBG (last 3)   Recent Labs  08/14/15 0201 08/14/15 0301 08/14/15 0359  GLUCAP 119* 101* 97   CXR: clear  Assessment/Plan: S/P Procedure(s) (LRB): CORONARY ARTERY BYPASS GRAFTING (CABG), ON PUMP, TIMES THREE, USING LEFT INTERNAL MAMMARY ARTERY, RIGHT GREATER SAPHENOUS VEIN HARVESTED ENDOSCOPICALLY (N/A) TRANSESOPHAGEAL ECHOCARDIOGRAM (TEE) (N/A) ENDARTERECTOMY CAROTID (Left)   He is hemodynamically stable and ready for extubation  Diabetes: start Levemir and SSI and stop drip  DC chest tubes and neck drain, swan, arterial line.  Diurese when off neo  Postop atrial fibrillation: rate controlled. Will start amio to try to convert.  Expected postop acute blood loss anemia. Observe.   LOS: 1 day    [name] 08/14/2015

## 2015-08-14 NOTE — Progress Notes (Signed)
Patient's rate reduced to 4. Patient is tolerating it well and breathing at a rate of 20. RT monitoring at bedside.

## 2015-08-14 NOTE — Progress Notes (Signed)
Pt attempted to be weaned again (3rd try this shift) unsuccessfully due to low volumes and low RR/periods of apnea. RRT placed pt back on full support, will attempt to have pt ready to wean for day-shift.

## 2015-08-15 ENCOUNTER — Inpatient Hospital Stay (HOSPITAL_COMMUNITY): Payer: Medicare (Managed Care)

## 2015-08-15 LAB — GLUCOSE, CAPILLARY
GLUCOSE-CAPILLARY: 131 mg/dL — AB (ref 65–99)
Glucose-Capillary: 114 mg/dL — ABNORMAL HIGH (ref 65–99)
Glucose-Capillary: 118 mg/dL — ABNORMAL HIGH (ref 65–99)
Glucose-Capillary: 81 mg/dL (ref 65–99)
Glucose-Capillary: 88 mg/dL (ref 65–99)

## 2015-08-15 LAB — CBC
HEMATOCRIT: 26.7 % — AB (ref 39.0–52.0)
Hemoglobin: 8.2 g/dL — ABNORMAL LOW (ref 13.0–17.0)
MCH: 25.9 pg — AB (ref 26.0–34.0)
MCHC: 30.7 g/dL (ref 30.0–36.0)
MCV: 84.5 fL (ref 78.0–100.0)
PLATELETS: 201 10*3/uL (ref 150–400)
RBC: 3.16 MIL/uL — ABNORMAL LOW (ref 4.22–5.81)
RDW: 14.8 % (ref 11.5–15.5)
WBC: 10.8 10*3/uL — AB (ref 4.0–10.5)

## 2015-08-15 LAB — BASIC METABOLIC PANEL
Anion gap: 7 (ref 5–15)
BUN: 15 mg/dL (ref 6–20)
CHLORIDE: 105 mmol/L (ref 101–111)
CO2: 30 mmol/L (ref 22–32)
CREATININE: 0.89 mg/dL (ref 0.61–1.24)
Calcium: 8.6 mg/dL — ABNORMAL LOW (ref 8.9–10.3)
GFR calc Af Amer: >60 mL/min (ref >60)
GFR calc non Af Amer: >60 mL/min (ref >60)
GLUCOSE: 125 mg/dL — AB (ref 65–99)
POTASSIUM: 4.1 mmol/L (ref 3.5–5.1)
SODIUM: 142 mmol/L (ref 135–145)

## 2015-08-15 MED ORDER — FUROSEMIDE 10 MG/ML IJ SOLN
40.0000 mg | Freq: Once | INTRAMUSCULAR | Status: AC
  Administered 2015-08-15: 40 mg via INTRAVENOUS
  Filled 2015-08-15: qty 4

## 2015-08-15 MED ORDER — INSULIN ASPART 100 UNIT/ML ~~LOC~~ SOLN
0.0000 [IU] | Freq: Three times a day (TID) | SUBCUTANEOUS | Status: DC
  Administered 2015-08-16: 3 [IU] via SUBCUTANEOUS
  Administered 2015-08-16 – 2015-08-17 (×3): 4 [IU] via SUBCUTANEOUS
  Administered 2015-08-17: 8 [IU] via SUBCUTANEOUS
  Administered 2015-08-17: 4 [IU] via SUBCUTANEOUS
  Administered 2015-08-18 (×2): 3 [IU] via SUBCUTANEOUS

## 2015-08-15 MED ORDER — AMIODARONE HCL 200 MG PO TABS
200.0000 mg | ORAL_TABLET | Freq: Two times a day (BID) | ORAL | Status: DC
  Administered 2015-08-15 – 2015-08-16 (×3): 200 mg via ORAL
  Filled 2015-08-15 (×3): qty 1

## 2015-08-15 MED ORDER — MORPHINE SULFATE (PF) 2 MG/ML IV SOLN: 2.0000 mg | INTRAVENOUS | Status: DC | PRN

## 2015-08-15 NOTE — Progress Notes (Signed)
[address]       [place] [address]             [phone]        CARDIOTHORACIC SURGERY PROGRESS NOTE   R2 Days Post-Op Procedure(s) (LRB): CORONARY ARTERY BYPASS GRAFTING (CABG), ON PUMP, TIMES THREE, USING LEFT INTERNAL MAMMARY ARTERY, RIGHT GREATER SAPHENOUS VEIN HARVESTED ENDOSCOPICALLY (N/A) TRANSESOPHAGEAL ECHOCARDIOGRAM (TEE) (N/A) ENDARTERECTOMY CAROTID (Left)  Subjective: Looks okay.  No specific complaints.  Weak.  Denies SOB  Objective: Vital signs: BP Readings from Last 1 Encounters:  08/15/15 123/80   Pulse Readings from Last 1 Encounters:  08/15/15 80   Resp Readings from Last 1 Encounters:  08/15/15 19   Temp Readings from Last 1 Encounters:  08/15/15 97.2 F (36.2 C) Oral    Hemodynamics:    Physical Exam:  Rhythm:   sinus  Breath sounds: clear  Heart sounds:  RRR  Incisions:  Dressing dry, intact  Abdomen:  Soft, non-distended, non-tender  Extremities:  Warm, well-perfused    Intake/Output from previous day: 01/06 0701 - 01/07 0700 In: 1108.2 [I.V.:958.2; IV Piggyback:150] Out: 1545 [Urine:1535; Drains:10] Intake/Output this shift: Total I/O In: 73.4 [I.V.:73.4] Out: 225 [Urine:225]  Lab Results:  CBC: Recent Labs  08/14/15 1623 08/14/15 1625 08/15/15 0400  WBC 9.9  --  10.8*  HGB 8.2* 8.8* 8.2*  HCT 25.9* 26.0* 26.7*  PLT 181  --  201    BMET:  Recent Labs  08/14/15 0400  08/14/15 1625 08/15/15 0400  NA 144  --  142 142  K 3.9  --  3.6 4.1  CL 106  --  99* 105  CO2 30  --   --  30  GLUCOSE 100*  --  107* 125*  BUN 19  --  21* 15  CREATININE 0.95  < > 0.90 0.89  CALCIUM 8.9  --   --  8.6*  < > = values in this interval not displayed.   PT/INR:   Recent Labs  08/13/15 1420  LABPROT 17.0*  INR 1.37    CBG (last 3)   Recent Labs  08/14/15 1944 08/14/15 2337 08/15/15 0800  GLUCAP 96 81 88    ABG    Component Value Date/Time   PHART 7.492* 08/13/2015 1426   PCO2ART 40.0  08/13/2015 1426   PO2ART 93.0 08/13/2015 1426   HCO3 30.9* 08/13/2015 1426   TCO2 30 08/14/2015 1625   O2SAT 98.0 08/13/2015 1426    CXR: PORTABLE CHEST 1 VIEW  COMPARISON: Chest radiograph 08/14/2015.  FINDINGS: Interval retraction pulmonary arterial catheter with she projecting over superior vena cava. Right IJ central venous catheter tip projects over the right atrium. Interval removal left chest tube. Interval extubation and removal of enteric tube. Stable cardiac and mediastinal contours status post median sternotomy. Low lung volumes. Interval increase in bibasilar airspace opacities. No definite large pleural effusion or pneumothorax.  IMPRESSION: Low lung volumes with increasing basilar opacities favored to represent atelectasis.  Interval extubation.   Electronically Signed  By: Lovey Newcomer M.D.  On: 08/15/2015 09:00  Assessment/Plan: S/P Procedure(s) (LRB): CORONARY ARTERY BYPASS GRAFTING (CABG), ON PUMP, TIMES THREE, USING LEFT INTERNAL MAMMARY ARTERY, RIGHT GREATER SAPHENOUS VEIN HARVESTED ENDOSCOPICALLY (N/A) TRANSESOPHAGEAL ECHOCARDIOGRAM (TEE) (N/A) ENDARTERECTOMY CAROTID (Left)  Overall stable POD2 Back in sinus rhythm, stable BP off Neo O2 sats 96% on 2 L/min Expected post op acute blood loss anemia, Hgb stable 8.2 Expected post op atelectasis, mild Expected post op  volume excess, mild Post op atrial fibrillation, maintaining NSR on amiodarone Type II diabetes mellitus, excellent glycemic control, CBG's relatively low Physical deconditioning, limited mobility   Mobilize  Convert amiodarone to oral  Convert CBG's and SSI to ac/hs  PT consult  Rexene Alberts, MD 08/15/2015 10:41 AM

## 2015-08-15 NOTE — Progress Notes (Signed)
TCTS BRIEF SICU PROGRESS NOTE  2 Days Post-Op  S/P Procedure(s) (LRB): CORONARY ARTERY BYPASS GRAFTING (CABG), ON PUMP, TIMES THREE, USING LEFT INTERNAL MAMMARY ARTERY, RIGHT GREATER SAPHENOUS VEIN HARVESTED ENDOSCOPICALLY (N/A) TRANSESOPHAGEAL ECHOCARDIOGRAM (TEE) (N/A) ENDARTERECTOMY CAROTID (Left)   Stable day  Plan: Continue current plan  Rexene Alberts, MD 08/15/2015 6:18 PM

## 2015-08-15 NOTE — Progress Notes (Signed)
Pt has not voided post foley removal at this time. Bladder scanned 219ml. Pt does not report any discomfort at this time. Will continue to monitor per catheter removal protocol

## 2015-08-15 NOTE — Progress Notes (Signed)
[facility]  Subjective  - no complaints   Objective 123/80 80 97.2 F (36.2 C) (Oral) 19 98%  Intake/Output Summary (Last 24 hours) at 08/15/15 0939 Last data filed at 08/15/15 0900  Gross per 24 hour  Intake 1071.08 ml  Output   1460 ml  Net -388.92 ml   Left neck incision healing no hematoma UE/LE 5/5 motor  Assessment/Planning: Doing well post CEA Follow up [facility] 2 weeks  Ruta Hinds 08/15/2015 9:39 AM --  Laboratory Lab Results:  Recent Labs  08/14/15 1623 08/14/15 1625 08/15/15 0400  WBC 9.9  --  10.8*  HGB 8.2* 8.8* 8.2*  HCT 25.9* 26.0* 26.7*  PLT 181  --  201   BMET  Recent Labs  08/14/15 0400  08/14/15 1625 08/15/15 0400  NA 144  --  142 142  K 3.9  --  3.6 4.1  CL 106  --  99* 105  CO2 30  --   --  30  GLUCOSE 100*  --  107* 125*  BUN 19  --  21* 15  CREATININE 0.95  < > 0.90 0.89  CALCIUM 8.9  --   --  8.6*  < > = values in this interval not displayed.  COAG Lab Results  Component Value Date   INR 1.37 08/13/2015   INR 1.06 08/11/2015   INR 1.01 05/15/2015   No results found for: PTT

## 2015-08-16 LAB — GLUCOSE, CAPILLARY
GLUCOSE-CAPILLARY: 178 mg/dL — AB (ref 65–99)
Glucose-Capillary: 137 mg/dL — ABNORMAL HIGH (ref 65–99)
Glucose-Capillary: 168 mg/dL — ABNORMAL HIGH (ref 65–99)
Glucose-Capillary: 197 mg/dL — ABNORMAL HIGH (ref 65–99)

## 2015-08-16 LAB — BASIC METABOLIC PANEL
Anion gap: 8 (ref 5–15)
BUN: 21 mg/dL — ABNORMAL HIGH (ref 6–20)
CHLORIDE: 104 mmol/L (ref 101–111)
CO2: 29 mmol/L (ref 22–32)
CREATININE: 1.06 mg/dL (ref 0.61–1.24)
Calcium: 8.7 mg/dL — ABNORMAL LOW (ref 8.9–10.3)
GFR calc non Af Amer: >60 mL/min (ref >60)
Glucose, Bld: 155 mg/dL — ABNORMAL HIGH (ref 65–99)
POTASSIUM: 4.2 mmol/L (ref 3.5–5.1)
SODIUM: 141 mmol/L (ref 135–145)

## 2015-08-16 LAB — CBC
HEMATOCRIT: 27.3 % — AB (ref 39.0–52.0)
Hemoglobin: 8.2 g/dL — ABNORMAL LOW (ref 13.0–17.0)
MCH: 25.6 pg — AB (ref 26.0–34.0)
MCHC: 30 g/dL (ref 30.0–36.0)
MCV: 85.3 fL (ref 78.0–100.0)
Platelets: 197 10*3/uL (ref 150–400)
RBC: 3.2 MIL/uL — AB (ref 4.22–5.81)
RDW: 14.7 % (ref 11.5–15.5)
WBC: 9.5 10*3/uL (ref 4.0–10.5)

## 2015-08-16 MED ORDER — METOPROLOL TARTRATE 12.5 MG HALF TABLET
12.5000 mg | ORAL_TABLET | Freq: Two times a day (BID) | ORAL | Status: DC
  Administered 2015-08-16 – 2015-08-18 (×5): 12.5 mg via ORAL
  Filled 2015-08-16 (×5): qty 1

## 2015-08-16 MED ORDER — WARFARIN SODIUM 2.5 MG PO TABS
2.5000 mg | ORAL_TABLET | Freq: Every day | ORAL | Status: DC
  Administered 2015-08-16: 2.5 mg via ORAL
  Filled 2015-08-16: qty 1

## 2015-08-16 MED ORDER — WARFARIN - PHYSICIAN DOSING INPATIENT
Freq: Every day | Status: DC
  Administered 2015-08-16: 1

## 2015-08-16 MED ORDER — AMIODARONE IV BOLUS ONLY 150 MG/100ML
150.0000 mg | Freq: Once | INTRAVENOUS | Status: AC
  Administered 2015-08-16: 150 mg via INTRAVENOUS
  Filled 2015-08-16: qty 100

## 2015-08-16 MED ORDER — AMIODARONE HCL 200 MG PO TABS
400.0000 mg | ORAL_TABLET | Freq: Two times a day (BID) | ORAL | Status: DC
  Administered 2015-08-16 – 2015-08-17 (×3): 400 mg via ORAL
  Filled 2015-08-16 (×3): qty 2

## 2015-08-16 NOTE — Progress Notes (Signed)
Pt attempted to use urinal x2 with no results. Bladder scanned 468ml. Attempted I&O cath x2 with asst and unable to advance catheter. Pt not c/o pain or discomfort at this time. Will notify MD

## 2015-08-16 NOTE — Progress Notes (Signed)
Dr. Roxy Manns notified pt currently in afib 100-150s, bp wnl. Order received for amiodarone 150mg  bolus IV x1. Also made aware pt has not voided since removal of catheter, last bladder scan 470ml, multiple unsuccessful attempts to cath and use urinal. Dr. Roxy Manns stated to have pt try standing on the side of the bed to use urinal or have another staff member try to place cath.

## 2015-08-16 NOTE — Progress Notes (Signed)
Pt unable to void. Bladder scanned 335ml. No c/o noted at this time. Attempted to use urinal but unsuccessful. Will continue to monitor per protocol

## 2015-08-16 NOTE — Progress Notes (Signed)
      [address]       [place] [address]             [phone]        CARDIOTHORACIC SURGERY PROGRESS NOTE   R3 Days Post-Op Procedure(s) (LRB): CORONARY ARTERY BYPASS GRAFTING (CABG), ON PUMP, TIMES THREE, USING LEFT INTERNAL MAMMARY ARTERY, RIGHT GREATER SAPHENOUS VEIN HARVESTED ENDOSCOPICALLY (N/A) TRANSESOPHAGEAL ECHOCARDIOGRAM (TEE) (N/A) ENDARTERECTOMY CAROTID (Left)  Subjective: Reports feeling well.  Minimal soreness.  Ate a good breakfast.  Voided urine.  No BM yet  Objective: Vital signs: BP Readings from Last 1 Encounters:  08/16/15 100/60   Pulse Readings from Last 1 Encounters:  08/16/15 71   Resp Readings from Last 1 Encounters:  08/16/15 16   Temp Readings from Last 1 Encounters:  08/16/15 98 F (36.7 C) Oral    Hemodynamics:    Physical Exam:  Rhythm:   two episodes rapid Afib this morning - currently stable NSR  Breath sounds: clear  Heart sounds:  RRR  Incisions:  Dressing dry, intact  Abdomen:  Soft, non-distended, non-tender  Extremities:  Warm, well-perfused   Intake/Output from previous day: 01/07 0701 - 01/08 0700 In: 363.5 [P.O.:240; I.V.:123.5] Out: 825 [Urine:825] Intake/Output this shift: Total I/O In: 253 [P.O.:240; I.V.:13] Out: -   Lab Results:  CBC: Recent Labs  08/15/15 0400 08/16/15 0423  WBC 10.8* 9.5  HGB 8.2* 8.2*  HCT 26.7* 27.3*  PLT 201 197    BMET:  Recent Labs  08/15/15 0400 08/16/15 0423  NA 142 141  K 4.1 4.2  CL 105 104  CO2 30 29  GLUCOSE 125* 155*  BUN 15 21*  CREATININE 0.89 1.06  CALCIUM 8.6* 8.7*     PT/INR:   Recent Labs  08/13/15 1420  LABPROT 17.0*  INR 1.37    CBG (last 3)   Recent Labs  08/15/15 1704 08/15/15 2142 08/16/15 0803  GLUCAP 118* 131* 137*    ABG    Component Value Date/Time   PHART 7.492* 08/13/2015 1426   PCO2ART 40.0 08/13/2015 1426   PO2ART 93.0 08/13/2015 1426   HCO3 30.9* 08/13/2015 1426   TCO2 30 08/14/2015 1625   O2SAT 98.0 08/13/2015 1426    CXR: n/a  Assessment/Plan: S/P Procedure(s) (LRB): CORONARY ARTERY BYPASS GRAFTING (CABG), ON PUMP, TIMES THREE, USING LEFT INTERNAL MAMMARY ARTERY, RIGHT GREATER SAPHENOUS VEIN HARVESTED ENDOSCOPICALLY (N/A) TRANSESOPHAGEAL ECHOCARDIOGRAM (TEE) (N/A) ENDARTERECTOMY CAROTID (Left)  Overall stable POD3 Post op atrial fibrillation, several episodes - currently in NSR O2 sats 98% on 2 L/min Expected post op acute blood loss anemia, Hgb stable 8.2 Expected post op atelectasis, mild Expected post op volume excess, mild Type II diabetes mellitus, excellent glycemic control, CBG's relatively low Physical deconditioning, limited mobility   Restart low dose metoprolol and increase amiodarone  Will start warfarin although I question whether or not he's a good candidate - may depend on ultimate disposition  Mobilize  PT consult  Lovenox for DVT prophylaxis  Rexene Alberts, MD 08/16/2015 10:44 AM

## 2015-08-16 NOTE — Evaluation (Signed)
Physical Therapy Evaluation Patient Details Name: [name] MRN: [id number] DOB: 03-06-1956 Today's Date: 08/16/2015   History of Present Illness  Pt is a 60 y.o. male s/p CABG 08-13-15. PMH consists of R midfoot amputation, large ventral hernia, DM, hypertension, hyperlipidemia, PAF, previous smoking, prior stroke and cerebrovascular disease and mental retardation.  Clinical Impression  Pt admitted with above diagnosis. Pt currently with functional limitations due to the deficits listed below (see PT Problem List). On eval, pt required +2 mod assist for mobility due to sternal precautions, min assist sit to stand and min assist ambulation pushing w/c 60 feet. Pt will benefit from skilled PT to increase their independence and safety with mobility to allow discharge to the venue listed below.  Pt receives services through [facility]. Recommend daily [facility] services and HHPT upon d/c.     Follow Up Recommendations Supervision/Assistance - 24 hour;Home health PT;Other (comment) (daily [facility] services)    Equipment Recommendations  None recommended by PT    Recommendations for Other Services       Precautions / Restrictions Precautions Precautions: Fall;Sternal Precaution Comments: Pt educated on sternal precautions. Restrictions Weight Bearing Restrictions: Yes (sternal precautions)      Mobility  Bed Mobility Overal bed mobility: Needs Assistance Bed Mobility: Supine to Sit     Supine to sit: +2 for physical assistance;Mod assist;HOB elevated     General bed mobility comments: verbal cues for sternal precautions  Transfers Overall transfer level: Needs assistance Equipment used: Pushed w/c Transfers: Sit to/from Stand Sit to Stand: Min assist         General transfer comment: verbal cues to cross arms  Ambulation/Gait Ambulation/Gait assistance: Min assist Ambulation Distance (Feet): 60 Feet (seated rest break after 30 feet) Assistive device:  (pushing w/c) Gait  Pattern/deviations: Step-through pattern;Decreased stride length Gait velocity: decreased   General Gait Details: verbal cues for pursed lip breathing, O2 at 4 L during ambulation  Stairs            Wheelchair Mobility    Modified Rankin (Stroke Patients Only)       Balance                                             Pertinent Vitals/Pain Pain Assessment: Faces Faces Pain Scale: Hurts a little bit Pain Location: sx site with mobility Pain Descriptors / Indicators: Grimacing;Guarding Pain Intervention(s): Monitored during session    Home Living Family/patient expects to be discharged to:: Private residence Living Arrangements: Other relatives Available Help at Discharge: Family Type of Home: House Home Access: Stairs to enter Entrance Stairs-Rails: None Entrance Stairs-Number of Steps: 3 Home Layout: One level Home Equipment: Environmental consultant - 2 wheels;Cane - single point;Bedside commode;Shower seat Additional Comments: Pt is a poor historian. He reports having a roommate and that his sister assists him on and off.    Prior Function Level of Independence: Needs assistance   Gait / Transfers Assistance Needed: RW for ambulation household distances.  ADL's / Homemaking Assistance Needed: Reports having HHA 2 hours/day 5 x week  Comments: Pt reports his roommate drives him to the grocery store.     Hand Dominance        Extremity/Trunk Assessment                         Communication   Communication: No  difficulties  Cognition Arousal/Alertness: Awake/alert Behavior During Therapy: Flat affect Overall Cognitive Status: History of cognitive impairments - at baseline                      General Comments      Exercises        Assessment/Plan    PT Assessment Patient needs continued PT services  PT Diagnosis Difficulty walking;Generalized weakness;Acute pain   PT Problem List Decreased strength;Decreased activity  tolerance;Decreased balance;Decreased mobility;Pain;Decreased knowledge of precautions;Cardiopulmonary status limiting activity;Decreased safety awareness;Decreased cognition  PT Treatment Interventions DME instruction;Gait training;Stair training;Functional mobility training;Therapeutic activities;Therapeutic exercise;Patient/family education;Cognitive remediation;Balance training   PT Goals (Current goals can be found in the Care Plan section) Acute Rehab PT Goals Patient Stated Goal: not stated PT Goal Formulation: With patient Time For Goal Achievement: 08/30/15 Potential to Achieve Goals: Good    Frequency Min 3X/week   Barriers to discharge        Co-evaluation               End of Session Equipment Utilized During Treatment: Gait belt Activity Tolerance: Patient tolerated treatment well Patient left: in chair;with call bell/phone within reach Nurse Communication: Mobility status         Time: [id number] PT Time Calculation (min) (ACUTE ONLY): 24 min   Charges:   PT Evaluation $PT Eval Moderate Complexity: 1 Procedure PT Treatments $Gait Training: 8-22 mins   PT G Codes:        Lorriane Shire 08/16/2015, 12:03 PM

## 2015-08-16 NOTE — Progress Notes (Signed)
TCTS BRIEF SICU PROGRESS NOTE  3 Days Post-Op  S/P Procedure(s) (LRB): CORONARY ARTERY BYPASS GRAFTING (CABG), ON PUMP, TIMES THREE, USING LEFT INTERNAL MAMMARY ARTERY, RIGHT GREATER SAPHENOUS VEIN HARVESTED ENDOSCOPICALLY (N/A) TRANSESOPHAGEAL ECHOCARDIOGRAM (TEE) (N/A) ENDARTERECTOMY CAROTID (Left)   Stable day  Plan: Continue current plan  Rexene Alberts, MD 08/16/2015 8:13 PM

## 2015-08-17 ENCOUNTER — Inpatient Hospital Stay (HOSPITAL_COMMUNITY): Payer: Medicare (Managed Care)

## 2015-08-17 LAB — CBC
HCT: 27.6 % — ABNORMAL LOW (ref 39.0–52.0)
Hemoglobin: 8.4 g/dL — ABNORMAL LOW (ref 13.0–17.0)
MCH: 25.7 pg — AB (ref 26.0–34.0)
MCHC: 30.4 g/dL (ref 30.0–36.0)
MCV: 84.4 fL (ref 78.0–100.0)
PLATELETS: 257 10*3/uL (ref 150–400)
RBC: 3.27 MIL/uL — AB (ref 4.22–5.81)
RDW: 14.6 % (ref 11.5–15.5)
WBC: 10.4 10*3/uL (ref 4.0–10.5)

## 2015-08-17 LAB — BASIC METABOLIC PANEL
Anion gap: 7 (ref 5–15)
BUN: 27 mg/dL — AB (ref 6–20)
CALCIUM: 8.6 mg/dL — AB (ref 8.9–10.3)
CO2: 29 mmol/L (ref 22–32)
CREATININE: 1.06 mg/dL (ref 0.61–1.24)
Chloride: 101 mmol/L (ref 101–111)
GFR calc Af Amer: >60 mL/min (ref >60)
Glucose, Bld: 199 mg/dL — ABNORMAL HIGH (ref 65–99)
POTASSIUM: 4.2 mmol/L (ref 3.5–5.1)
SODIUM: 137 mmol/L (ref 135–145)

## 2015-08-17 LAB — GLUCOSE, CAPILLARY
GLUCOSE-CAPILLARY: 110 mg/dL — AB (ref 65–99)
GLUCOSE-CAPILLARY: 173 mg/dL — AB (ref 65–99)
GLUCOSE-CAPILLARY: 210 mg/dL — AB (ref 65–99)
Glucose-Capillary: 180 mg/dL — ABNORMAL HIGH (ref 65–99)
Glucose-Capillary: 140 mg/dL — ABNORMAL HIGH (ref 65–99)

## 2015-08-17 LAB — PROTIME-INR
INR: 1.13 (ref 0.00–1.49)
PROTHROMBIN TIME: 14.7 s (ref 11.6–15.2)

## 2015-08-17 MED ORDER — TRAMADOL HCL 50 MG PO TABS
50.0000 mg | ORAL_TABLET | ORAL | Status: DC | PRN
  Administered 2015-08-17 – 2015-08-18 (×3): 50 mg via ORAL
  Administered 2015-08-18 (×2): 100 mg via ORAL
  Administered 2015-08-19 – 2015-08-20 (×3): 50 mg via ORAL
  Administered 2015-08-20: 100 mg via ORAL
  Administered 2015-08-20: 50 mg via ORAL
  Filled 2015-08-17 (×3): qty 1
  Filled 2015-08-17: qty 2
  Filled 2015-08-17 (×2): qty 1
  Filled 2015-08-17 (×2): qty 2
  Filled 2015-08-17 (×2): qty 1

## 2015-08-17 MED ORDER — ONDANSETRON HCL 4 MG PO TABS
4.0000 mg | ORAL_TABLET | Freq: Four times a day (QID) | ORAL | Status: DC | PRN
  Administered 2015-08-20 – 2015-08-21 (×2): 4 mg via ORAL
  Filled 2015-08-17 (×2): qty 1

## 2015-08-17 MED ORDER — ASPIRIN EC 325 MG PO TBEC
325.0000 mg | DELAYED_RELEASE_TABLET | Freq: Every day | ORAL | Status: DC
  Administered 2015-08-17 – 2015-08-19 (×3): 325 mg via ORAL
  Filled 2015-08-17 (×3): qty 1

## 2015-08-17 MED ORDER — FAMOTIDINE 20 MG PO TABS
20.0000 mg | ORAL_TABLET | Freq: Two times a day (BID) | ORAL | Status: DC
  Administered 2015-08-17 – 2015-08-20 (×7): 20 mg via ORAL
  Filled 2015-08-17 (×7): qty 1

## 2015-08-17 MED ORDER — ONDANSETRON HCL 4 MG/2ML IJ SOLN: 4.0000 mg | Freq: Four times a day (QID) | INTRAMUSCULAR | Status: DC | PRN

## 2015-08-17 MED ORDER — SODIUM CHLORIDE 0.9 % IJ SOLN: 3.0000 mL | INTRAMUSCULAR | Status: DC | PRN

## 2015-08-17 MED ORDER — SODIUM CHLORIDE 0.9 % IJ SOLN
3.0000 mL | Freq: Two times a day (BID) | INTRAMUSCULAR | Status: DC
  Administered 2015-08-17 – 2015-08-20 (×6): 3 mL via INTRAVENOUS

## 2015-08-17 MED ORDER — PNEUMOCOCCAL VAC POLYVALENT 25 MCG/0.5ML IJ INJ: 0.5000 mL | INJECTION | INTRAMUSCULAR | Status: DC | PRN

## 2015-08-17 MED ORDER — FUROSEMIDE 40 MG PO TABS
40.0000 mg | ORAL_TABLET | Freq: Every day | ORAL | Status: DC
  Administered 2015-08-17 – 2015-08-20 (×4): 40 mg via ORAL
  Filled 2015-08-17 (×2): qty 1
  Filled 2015-08-17: qty 2
  Filled 2015-08-17: qty 1

## 2015-08-17 MED ORDER — ACETAMINOPHEN 325 MG PO TABS
650.0000 mg | ORAL_TABLET | Freq: Four times a day (QID) | ORAL | Status: DC | PRN
  Administered 2015-08-18 – 2015-08-21 (×6): 650 mg via ORAL
  Filled 2015-08-17 (×6): qty 2

## 2015-08-17 MED ORDER — SODIUM CHLORIDE 0.9 % IV SOLN
250.0000 mL | INTRAVENOUS | Status: DC | PRN
Start: 2015-08-17 — End: 2015-08-21

## 2015-08-17 MED ORDER — INSULIN DETEMIR 100 UNIT/ML ~~LOC~~ SOLN
30.0000 [IU] | Freq: Every day | SUBCUTANEOUS | Status: DC
  Administered 2015-08-17: 30 [IU] via SUBCUTANEOUS
  Filled 2015-08-17 (×2): qty 0.3

## 2015-08-17 MED ORDER — MOVING RIGHT ALONG BOOK
Freq: Once | Status: AC
  Administered 2015-08-17: 19:00:00
  Filled 2015-08-17: qty 1

## 2015-08-17 MED ORDER — POTASSIUM CHLORIDE CRYS ER 20 MEQ PO TBCR
20.0000 meq | EXTENDED_RELEASE_TABLET | Freq: Every day | ORAL | Status: DC
  Administered 2015-08-17 – 2015-08-19 (×3): 20 meq via ORAL
  Filled 2015-08-17 (×3): qty 1

## 2015-08-17 NOTE — Progress Notes (Signed)
Physical Therapy Treatment Patient Details Name: SPIRO VARGAZ MRN: [id number] DOB: 1955-12-06 Today's Date: 08/17/2015    History of Present Illness Pt is a 60 y.o. male s/p CABG 08-13-15. PMH consists of R midfoot amputation, large ventral hernia, DM, hypertension, hyperlipidemia, PAF, previous smoking, prior stroke and cerebrovascular disease and mental retardation.    PT Comments    Improving gait stability and tolerance for activity.  Emphasis also on transfer safety, bed mobility following sternal precautions.   Follow Up Recommendations  Home health PT     Equipment Recommendations  None recommended by PT    Recommendations for Other Services       Precautions / Restrictions Precautions Precautions: Fall;Sternal Precaution Comments: Pt educated on sternal precautions.    Mobility  Bed Mobility Overal bed mobility: Needs Assistance Bed Mobility: Sit to Sidelying         Sit to sidelying: Mod assist General bed mobility comments: verbal cues for sternal precautions  Transfers Overall transfer level: Needs assistance Equipment used: Pushed w/c Transfers: Sit to/from Stand Sit to Stand: Min assist         General transfer comment: cues for sternal precaution and reinforced hand placement  Ambulation/Gait Ambulation/Gait assistance: Min assist Ambulation Distance (Feet): 240 Feet Assistive device:  (pushed w/c) Gait Pattern/deviations: Step-through pattern Gait velocity: decreased Gait velocity interpretation: Below normal speed for age/gender General Gait Details: fatigue showing in the last 40 feet.  Improved gait stability.   Stairs            Wheelchair Mobility    Modified Rankin (Stroke Patients Only)       Balance Overall balance assessment: Needs assistance Sitting-balance support: No upper extremity supported Sitting balance-Leahy Scale: Fair     Standing balance support: Bilateral upper extremity supported                         Cognition Arousal/Alertness: Awake/alert Behavior During Therapy: WFL for tasks assessed/performed Overall Cognitive Status: History of cognitive impairments - at baseline                      Exercises      General Comments General comments (skin integrity, edema, etc.): VSS,  SpO2 at rest on RA mid 90%'s,   on 2L [place] with gait, sats mid 90'd        Pertinent Vitals/Pain Pain Assessment: Faces Faces Pain Scale: Hurts little more Pain Location: sternum Pain Descriptors / Indicators: Discomfort;Grimacing Pain Intervention(s): Monitored during session    Home Living                      Prior Function            PT Goals (current goals can now be found in the care plan section) Acute Rehab PT Goals Patient Stated Goal: not stated PT Goal Formulation: With patient Time For Goal Achievement: 08/30/15 Potential to Achieve Goals: Good Progress towards PT goals: Progressing toward goals    Frequency  Min 3X/week    PT Plan Current plan remains appropriate    Co-evaluation             End of Session   Activity Tolerance: Patient tolerated treatment well Patient left: in bed;with call bell/phone within reach     Time: 1213-1228 PT Time Calculation (min) (ACUTE ONLY): 15 min  Charges:  $Gait Training: 8-22 mins  G Codes:      Yaremi Stahlman, Tessie Fass 08/17/2015, 1:14 PM 08/17/2015  Donnella Sham, [place] [phone]  (pager)

## 2015-08-17 NOTE — Clinical Social Work Note (Signed)
CSW consult acknowledged:  Clinical Education officer, museum received consult for SNF placement. PT currently recommending Home Health.   Clinical Social Worker will sign off for now as social work intervention is no longer needed. Please consult [place] again if new need arises.  Glendon Axe, MSW, LCSWA [phone] 08/17/2015 4:07 PM

## 2015-08-17 NOTE — Care Management Note (Signed)
Case Management Note  Patient Details  Name: BOWIE SCELSI MRN: [id number] Date of Birth: April 11, 1956  Subjective/Objective:    Lives at home with roommate.  Roommate works nights, no one else around.  Has sister, who he would like [place] to call.  Talked with sister, she states he has been at [facility] in past and would like him to return there post discharge.  SW referral made.                Action/Plan:   Expected Discharge Date:                  Expected Discharge Plan:  Skilled Nursing Facility  In-House Referral:  Clinical Social Work  Discharge planning Services  CM Consult  Post Acute Care Choice:    Choice offered to:     DME Arranged:    DME Agency:     HH Arranged:    University Agency:     Status of Service:  In process, will continue to follow  Medicare Important Message Given:    Date Medicare IM Given:    Medicare IM give by:    Date Additional Medicare IM Given:    Additional Medicare Important Message give by:     If discussed at [place] of Stay Meetings, dates discussed:    Additional Comments:  Vergie Living, RN 08/17/2015, 11:23 AM

## 2015-08-17 NOTE — Progress Notes (Signed)
   VASCULAR SURGERY ASSESSMENT & PLAN:  * 4 Days Post-Op s/p: Combined CABG/ L CEA  *  Doing well. Being transferred to 2 W today.  * I will arrange f/u in 2-3 weeks with me and will continue to follow his 60-79% right carotid stenosis.  SUBJECTIVE: No complaints.  PHYSICAL EXAM: Filed Vitals:   08/17/15 0436 08/17/15 0500 08/17/15 0600 08/17/15 0700  BP:  109/67 109/62 108/60  Pulse:  64 64 61  Temp: 97.9 F (36.6 C)   97.9 F (36.6 C)  TempSrc: Oral   Oral  Resp:  20 19 16   Height:      Weight:      SpO2:  99% 98% 98%   Neuro intact Left neck incision looks fine.   LABS: Lab Results  Component Value Date   WBC 10.4 08/17/2015   HGB 8.4* 08/17/2015   HCT 27.6* 08/17/2015   MCV 84.4 08/17/2015   PLT 257 08/17/2015   Lab Results  Component Value Date   CREATININE 1.06 08/17/2015   Lab Results  Component Value Date   INR 1.13 08/17/2015   CBG (last 3)   Recent Labs  08/16/15 1210 08/16/15 1745 08/16/15 2132  GLUCAP 178* 168* 197*    Active Problems:   CAD (coronary artery disease)    Gae Gallop Beeper: [id number] 08/17/2015

## 2015-08-17 NOTE — Progress Notes (Addendum)
4 Days Post-Op Procedure(s) (LRB): CORONARY ARTERY BYPASS GRAFTING (CABG), ON PUMP, TIMES THREE, USING LEFT INTERNAL MAMMARY ARTERY, RIGHT GREATER SAPHENOUS VEIN HARVESTED ENDOSCOPICALLY (N/A) TRANSESOPHAGEAL ECHOCARDIOGRAM (TEE) (N/A) ENDARTERECTOMY CAROTID (Left) Subjective:  Sore but otherwise feels well Bowel movement yesterday Ambulated 40 ft with PT.  Objective: Vital signs in last 24 hours: Temp:  [97.4 F (36.3 C)-98 F (36.7 C)] 97.9 F (36.6 C) (01/09 0436) Pulse Rate:  [61-81] 61 (01/09 0700) Cardiac Rhythm:  [-] Normal sinus rhythm (01/09 0400) Resp:  [14-29] 16 (01/09 0700) BP: (91-121)/(45-97) 108/60 mmHg (01/09 0700) SpO2:  [89 %-100 %] 98 % (01/09 0700) Weight:  [89.5 kg (197 lb 5 oz)] 89.5 kg (197 lb 5 oz) (01/09 0350)  Hemodynamic parameters for last 24 hours:    Intake/Output from previous day: 01/08 0701 - 01/09 0700 In: 493 [P.O.:480; I.V.:13] Out: 900 [Urine:900] Intake/Output this shift:    General appearance: alert and cooperative Neurologic: intact Heart: regular rate and rhythm, S1, S2 normal, no murmur, click, rub or gallop Lungs: diminished breath sounds bibasilar Extremities: edema minimal in legs Wound: incisions ok  Lab Results:  Recent Labs  08/16/15 0423 08/17/15 0357  WBC 9.5 10.4  HGB 8.2* 8.4*  HCT 27.3* 27.6*  PLT 197 257   BMET:  Recent Labs  08/16/15 0423 08/17/15 0357  NA 141 137  K 4.2 4.2  CL 104 101  CO2 29 29  GLUCOSE 155* 199*  BUN 21* 27*  CREATININE 1.06 1.06  CALCIUM 8.7* 8.6*    PT/INR:  Recent Labs  08/17/15 0357  LABPROT 14.7  INR 1.13   ABG    Component Value Date/Time   PHART 7.492* 08/13/2015 1426   HCO3 30.9* 08/13/2015 1426   TCO2 30 08/14/2015 1625   O2SAT 98.0 08/13/2015 1426   CBG (last 3)   Recent Labs  08/16/15 1210 08/16/15 1745 08/16/15 2132  GLUCAP 178* 168* 197*   CXR: small bilateral pleural effusions and bibasilar atelectasis.  Assessment/Plan: S/P  Procedure(s) (LRB): CORONARY ARTERY BYPASS GRAFTING (CABG), ON PUMP, TIMES THREE, USING LEFT INTERNAL MAMMARY ARTERY, RIGHT GREATER SAPHENOUS VEIN HARVESTED ENDOSCOPICALLY (N/A) TRANSESOPHAGEAL ECHOCARDIOGRAM (TEE) (N/A) ENDARTERECTOMY CAROTID (Left)  He is hemodynamically stable.  Postop atrial fibrillation: he had a brief episode yesterday converted with additional amio and oral dose increased. He has a history of PAF and prior stroke and was on Pradaxa prior to surgery. Will resume at discharge.   DM: glucose increased now that he is eating. Will resume levemir.  Deconditioning and debilitation due to body habitus. Continue PT. Will need to work on placement. He was living with his sister's boyfriend prior to surgery.   Transfer to 2W stepdown and continue mobilization.   LOS: 4 days    [name] 08/17/2015

## 2015-08-17 NOTE — Care Management Important Message (Signed)
Important Message  Patient Details  Name: [name] MRN: [id number] Date of Birth: 1956-01-09   Medicare Important Message Given:  Yes    Louanne Belton 08/17/2015, 12:42 [place] Message  Patient Details  Name: [name] MRN: [id number] Date of Birth: 09/28/1955   Medicare Important Message Given:  Yes    Anicia Leuthold, Neal Dy 08/17/2015, 12:41 PM

## 2015-08-18 ENCOUNTER — Telehealth: Payer: Self-pay | Admitting: Vascular Surgery

## 2015-08-18 LAB — GLUCOSE, CAPILLARY
GLUCOSE-CAPILLARY: 109 mg/dL — AB (ref 65–99)
GLUCOSE-CAPILLARY: 112 mg/dL — AB (ref 65–99)
GLUCOSE-CAPILLARY: 126 mg/dL — AB (ref 65–99)
Glucose-Capillary: 124 mg/dL — ABNORMAL HIGH (ref 65–99)
Glucose-Capillary: 113 mg/dL — ABNORMAL HIGH (ref 65–99)

## 2015-08-18 MED ORDER — METOPROLOL TARTRATE 25 MG PO TABS
25.0000 mg | ORAL_TABLET | Freq: Two times a day (BID) | ORAL | Status: DC
  Administered 2015-08-18: 25 mg via ORAL
  Filled 2015-08-18: qty 1

## 2015-08-18 MED ORDER — INSULIN DETEMIR 100 UNIT/ML ~~LOC~~ SOLN
15.0000 [IU] | Freq: Every day | SUBCUTANEOUS | Status: DC
  Administered 2015-08-18 – 2015-08-19 (×2): 15 [IU] via SUBCUTANEOUS
  Filled 2015-08-18 (×3): qty 0.15

## 2015-08-18 MED ORDER — AMIODARONE HCL IN DEXTROSE 360-4.14 MG/200ML-% IV SOLN
30.0000 mg/h | INTRAVENOUS | Status: DC
  Administered 2015-08-18: 30 mg/h via INTRAVENOUS
  Filled 2015-08-18: qty 200

## 2015-08-18 MED ORDER — DABIGATRAN ETEXILATE MESYLATE 150 MG PO CAPS
150.0000 mg | ORAL_CAPSULE | Freq: Two times a day (BID) | ORAL | Status: DC
  Administered 2015-08-19 – 2015-08-21 (×5): 150 mg via ORAL
  Filled 2015-08-18 (×6): qty 1

## 2015-08-18 MED ORDER — AMIODARONE HCL 200 MG PO TABS: 400.0000 mg | ORAL_TABLET | Freq: Two times a day (BID) | ORAL | Status: DC

## 2015-08-18 MED ORDER — AMIODARONE HCL IN DEXTROSE 360-4.14 MG/200ML-% IV SOLN
60.0000 mg/h | INTRAVENOUS | Status: DC
  Administered 2015-08-18 (×2): 60 mg/h via INTRAVENOUS

## 2015-08-18 MED ORDER — AMIODARONE HCL IN DEXTROSE 360-4.14 MG/200ML-% IV SOLN
INTRAVENOUS | Status: AC
  Administered 2015-08-18: 200 mL
  Filled 2015-08-18: qty 200

## 2015-08-18 MED ORDER — AMIODARONE HCL 200 MG PO TABS
200.0000 mg | ORAL_TABLET | Freq: Two times a day (BID) | ORAL | Status: DC
  Administered 2015-08-18: 200 mg via ORAL
  Filled 2015-08-18: qty 1

## 2015-08-18 MED ORDER — AMIODARONE HCL 200 MG PO TABS: 400.0000 mg | ORAL_TABLET | Freq: Three times a day (TID) | ORAL | Status: DC

## 2015-08-18 MED ORDER — AMIODARONE IV BOLUS ONLY 150 MG/100ML
150.0000 mg | Freq: Once | INTRAVENOUS | Status: AC
  Administered 2015-08-18: 150 mg via INTRAVENOUS
  Filled 2015-08-18: qty 100

## 2015-08-18 MED ORDER — METFORMIN HCL 500 MG PO TABS
1000.0000 mg | ORAL_TABLET | Freq: Two times a day (BID) | ORAL | Status: DC
  Administered 2015-08-18 – 2015-08-21 (×6): 1000 mg via ORAL
  Filled 2015-08-18 (×6): qty 2

## 2015-08-18 NOTE — Telephone Encounter (Signed)
-----   Message from Angelia Mould, MD sent at 08/17/2015 10:37 AM EST ----- Regarding: f/u He needs a f/u apt in 3 weeks if this has not already been arranged.  Thanks CD

## 2015-08-18 NOTE — Telephone Encounter (Signed)
Lm for pt re appt, dpm  °

## 2015-08-18 NOTE — Clinical Social Work Placement (Signed)
   CLINICAL SOCIAL WORK PLACEMENT  NOTE  Date:  08/18/2015  Patient Details  Name: [name] MRN: [id number] Date of Birth: 1956/02/11  Clinical Social Work is seeking post-discharge placement for this patient at the [place] level of care (*CSW will initial, date and re-position this form in  chart as items are completed):  Yes   Patient/family provided with [place] Work Department's list of facilities offering this level of care within the geographic area requested by the patient (or if unable, by the patient's family).  Yes   Patient/family informed of their freedom to choose among providers that offer the needed level of care, that participate in Medicare, Medicaid or managed care program needed by the patient, have an available bed and are willing to accept the patient.  Yes   Patient/family informed of [place]'s ownership interest in [facility] and [facility], as well as of the fact that they are under no obligation to receive care at these facilities.  PASRR submitted to EDS on       PASRR number received on       Existing PASRR number confirmed on 08/18/15     FL2 transmitted to all facilities in geographic area requested by pt/family on 08/18/15     FL2 transmitted to all facilities within larger geographic area on       Patient informed that his/her managed care company has contracts with or will negotiate with certain facilities, including the following:        Yes   Patient/family informed of bed offers received.  Patient chooses bed at [place] [facility] recommends and patient chooses bed at      Patient to be transferred to [facility] on  .  Patient to be transferred to facility by       Patient family notified on   of transfer.  Name of family member notified:        PHYSICIAN Please sign FL2     Additional Comment:     _______________________________________________ Cranford Mon, LCSW 08/18/2015, 4:03 PM

## 2015-08-18 NOTE — Clinical Social Work Note (Signed)
Clinical Social Work Assessment  Patient Details  Name: [name] MRN: [id number] Date of Birth: March 06, 1956  Date of referral:  08/18/15               Reason for consult:  Facility Placement                Permission sought to share information with:  Family Supports, Customer service manager, Case Optician, dispensing granted to share information::  Yes, Verbal Permission Granted  Name::     Sindy Messing and [employer]::  [place], [facility]  Relationship::  [facility] Education officer, museum, sister  Sport and exercise psychologist Information:  [facility] CSW: [phone]  Housing/Transportation Living arrangements for the past 2 months:  [place] of Information:  Patient, Tourist information centre manager, Other (Comment Required) (sibling) Patient Interpreter Needed:  None Criminal Activity/Legal Involvement Pertinent to Current Situation/Hospitalization:  No - Comment as needed Significant Relationships:  Siblings, [employer] Lives with:  Roommate Do you feel safe going back to the place where you live?  No Need for family participation in patient care:  Yes (Comment)  Care giving concerns: Pt lives at home with roommate who is unable to assist with medication management when pt goes home.   Social Worker assessment / plan:  CSW discussed MD concerns for pt medical management after heart surgery with pt Case Manager at [employer] and with pt sister.    Employment status:  Disabled (Comment on whether or not currently receiving Disability) (MR) Insurance information:  Managed Care PT Recommendations:  24 Hour Supervision Information / Referral to community resources:  [place]  Patient/Family's Response to care:  [facility] CSW and pt sister agreeable to short term SNF placement following pt surgery and think that this will be safest DC plan for pt.  Pt also in agreement and has been to SNF in the past- prefer [place].  Patient/Family's Understanding of and Emotional Response to Diagnosis, Current Treatment,  and Prognosis:  No questions or concerns at this time.  Emotional Assessment Appearance:  Appears stated age Attitude/Demeanor/Rapport:  Unable to Assess Affect (typically observed):  Unable to Assess Orientation:  Oriented to  Time, Oriented to Situation, Oriented to Place, Oriented to Self Alcohol / Substance use:  Not Applicable Psych involvement (Current and /or in the community):     Discharge Needs  Concerns to be addressed:  Home Safety Concerns Readmission within the last 30 days:  No Current discharge risk:  Lack of support system Barriers to Discharge:  Continued Medical Work up   [employer], LCSW 08/18/2015, 4:01 PM

## 2015-08-18 NOTE — Discharge Summary (Signed)
Physician Discharge Summary       [address]       [place] [address]             [phone]    Patient ID: LAWAYNE FRAGOSO MRN: [id number] DOB/AGE: [birth year] 60 y.o.  Admit date: 08/13/2015 Discharge date: 08/21/2015  Principle Diagnoses: 1. Left internal carotid artery stenosis 2. Multivessel CAD  Discharge Diagnoses:  1. Hypertension 2. Tobacco abuse 3. Hyperlipidemia 4. PAF ([employer]) 5. CVA 6. Obesity 7. COPD ([place]) 8. GERD 9. Chronic diastolic CHF (congestive heart failure) ([place]) 10. Diabetes mellitus 11. Chronic low back pain 12. Mental retardation 13. Anemia   Procedure (s):  Left carotid endarterectomy with Dacron patch angioplasty by Dr. Scot Dock on 08/13/2015.  1. Median Sternotomy 2. Extracorporeal circulation 3. Coronary artery bypass grafting x 3   Left internal mammary graft to the LAD  SVG to Ramus  SVG to OM2  4. Endoscopic vein harvest from the right leg   History of Presenting Illness: The patient is a 60 year old gentleman with a history of DM with complications, hypertension, hyperlipidemia, PAF, previous smoking, prior stroke and cerebrovascular disease and mental retardation who has been followed by Dr. [name] for carotid artery disease. He has had progression of his left carotid stenosis to greater than 80% and left CEA was recommended. He had been admitted in March 2016 with CHF and a NSTEMI and has occasional chest pain and left shoulder pain. He had an abnormal nuclear stress test and catheterization shows severe multi-vessel coronary disease with diffusely diseased and calcified coronary arteries. He was initially scheduled for combined left CEA and CABG on 05/19/2015 but his Hgb A1c preop was 10.7 and his glucose was 450 when he came to Short Stay the morning of surgery so it was cancelled. He has been further educated on dietary modification and has followed up with his PCP and followed in our office  with serial Hgb A1c levels. His glucose has been under good control and his Hgb A1c is now 5.6. Dr. Cyndia Bent discussed the need for coronary artery bypass grafting surgery. In addition, he needed to have a left carotid endarterectomy by Dr. Scot Dock as he had an 80% left internal carotid artery stenosis. Potential risks, benefits, and complications of the surgery were discussed with the patient and he agreed to proceed with surgery. He underwent a left carotid endarterectomy with patch followed by a CABG x 3 on 08/13/2015.  Brief Hospital Course:  The patient was extubated the morning of post op day one without difficulty. He remained afebrile and hemodynamically stable. He was weaned off of Neo synephrine drip. Gordy Councilman, a line, chest tubes, and foley were removed early in the post operative course. Lopressor was started and titrated accordingly. He had a history of PAF and was on Pradaxa pre op. He was put on Amiodarone. His rhythm was such that his heart rate was in the 50's when in sinus rhythm and above 110+ when in a fib;almost like a tachy brady syndrome. Amiodarone was stopped, Lopressor was changed to 25 mg tid, and he was started on Digoxin. He was restarted on Pradaxa until 1/11 after EPW were removed. He was volume over loaded and diuresed. He had ABL anemia. He did not require a post op transfusion. He was on [place]. His last H and H was stable at 8.7 and 28.2. He was weaned off the insulin drip.  The patient's HGA1C pre op was 5.8. Once he  was tolerating a diet, home diabetic medicines were restarted.  The patient's glucose remained well controlled. He has been on Metformin 1000 mg bid and low doses of Levemir. He was on Lantus 20 units in the am and 60 units in the pm;however, his glucose has been on the low side with minimal Insulin. Therefore, his daily Insulin dose will need to be adjusted accordingly. The patient was felt surgically stable for transfer from the ICU to PCTU for further  convalescence on 08/16/2014. He continues to progress with cardiac rehab. He was ambulating on room air. He has been tolerating a diet and has had a bowel movement. Epicardial pacing wires were removed on 01/10.  Per pharmacy recommendation, his digoxin level was 1.5 so Digoxin was decreased from 0.25 mg daily to 0.125 mg daily. We will ask the SNF to please remove the chest tube sutures on 08/25/2015.  The patient is felt surgically stable for discharge to SNF today.  We ask the SNF to please do the following: 1. Please obtain vital signs at least one time daily 2.Please weigh the patient daily. If he or she continues to gain weight or develops lower extremity edema, contact the office at [phone]. 3. Ambulate patient at least three times daily and please use sternal precautions.  Latest Vital Signs: Blood pressure 137/50, pulse 57, temperature 97.4 F (36.3 C), temperature source Oral, resp. rate 18, height 5\' 5"  (1.651 m), weight 195 lb 4.8 oz (88.587 kg), SpO2 96 %.  Physical Exam: Cardiovascular: RRR Pulmonary: Slightly diminished at bases bilaterally; no rales, wheezes, or rhonchi. Abdomen: Soft, non tender, bowel sounds present. Extremities: Mild bilateral lower extremity edema. Wounds: Clean and dry. No erythema or signs of infection.  Discharge Condition:Stable and discharged to SNF  Recent laboratory studies:  Lab Results  Component Value Date   WBC 8.2 08/21/2015   HGB 8.7* 08/21/2015   HCT 28.2* 08/21/2015   MCV 83.2 08/21/2015   PLT 341 08/21/2015   Lab Results  Component Value Date   NA 137 08/21/2015   K 5.2* 08/21/2015   CL 99* 08/21/2015   CO2 31 08/21/2015   CREATININE 1.21 08/21/2015   GLUCOSE 80 08/21/2015    Diagnostic Studies:  Dg Chest Port 1 View  08/17/2015  CLINICAL DATA:  CABG. EXAM: PORTABLE CHEST 1 VIEW COMPARISON:  08/15/2015. FINDINGS: Interim removal right IJ sheath. Right IJ line in stable position. Prior CABG. Persistent cardiomegaly.  Low lung volumes with bibasilar atelectasis and/or infiltrates/edema. Small bilateral pleural effusions. No pneumothorax. IMPRESSION: 1. Right IJ line in stable position. 2. Prior CABG. Stable cardiomegaly. Persistent bibasilar atelectasis and/or mild infiltrates and or edema. Small bilateral effusions. No interim change. Electronically Signed   By: Marcello Moores  Register   On: 08/17/2015 08:04   Discharge Medications:   Medication List    STOP taking these medications        furosemide 80 MG tablet  Commonly known as:  LASIX     isosorbide mononitrate 30 MG 24 hr tablet  Commonly known as:  IMDUR     magnesium oxide 400 MG tablet  Commonly known as:  MAG-OX     metoprolol succinate 50 MG 24 hr tablet  Commonly known as:  TOPROL-XL     nitroGLYCERIN 0.4 MG SL tablet  Commonly known as:  NITROSTAT      TAKE these medications        acetaminophen 325 MG tablet  Commonly known as:  TYLENOL  Take 2 tablets (650  mg total) by mouth every 6 (six) hours as needed for mild pain.     aspirin 81 MG EC tablet  Take 1 tablet (81 mg total) by mouth daily.     atorvastatin 80 MG tablet  Commonly known as:  LIPITOR  Take 1 tablet (80 mg total) by mouth daily at 6 PM.     clonazePAM 0.5 MG tablet  Commonly known as:  KLONOPIN  Take 1 tablet (0.5 mg total) by mouth at bedtime.     dabigatran 150 MG Caps capsule  Commonly known as:  PRADAXA  Take 1 capsule (150 mg total) by mouth every 12 (twelve) hours.     digoxin 0.125 MG tablet  Commonly known as:  LANOXIN  Take 1 tablet (0.125 mg total) by mouth daily.     ferrous gluconate 324 MG tablet  Commonly known as:  FERGON  Take 1 tablet (324 mg total) by mouth daily with breakfast.     gabapentin 300 MG capsule  Commonly known as:  NEURONTIN  Take 1 capsule (300 mg total) by mouth 2 (two) times daily.     HYDROcodone-acetaminophen 5-325 MG tablet  Commonly known as:  NORCO/VICODIN  Take 1 tablet by mouth every 6 (six) hours as needed  for moderate pain.     hydrocortisone 2.5 % ointment  Apply topically 3 (three) times daily. To left leg around wound. Do not apply to broken skin.     insulin aspart 100 UNIT/ML injection  Commonly known as:  novoLOG  Inject 0-20 Units into the skin 3 (three) times daily with meals. Units of Insulin CBG 70-120=0       CBG 251-300=11 CBG 121-150=3     CBG 301-350=15 CBG 151-200=4     CBG 351-400=20 CBG 201-250=7     CBG>400, call MD     insulin glargine 100 UNIT/ML injection  Commonly known as:  LANTUS  Inject 0.2-0.6 mLs (20-60 Units total) into the skin 2 (two) times daily. 20 units in the morning, 60 units in the evening     metFORMIN 1000 MG tablet  Commonly known as:  GLUCOPHAGE  Take 1,000 mg by mouth 2 (two) times daily with a meal.     metoprolol tartrate 25 MG tablet  Commonly known as:  LOPRESSOR  Take 1 tablet (25 mg total) by mouth 3 (three) times daily.     omeprazole 20 MG capsule  Commonly known as:  PRILOSEC  Take 20 mg by mouth daily.     PARoxetine 40 MG tablet  Commonly known as:  PAXIL  Take 40 mg by mouth every morning.     saccharomyces boulardii 250 MG capsule  Commonly known as:  FLORASTOR  Take 1 capsule (250 mg total) by mouth 2 (two) times daily.     Vitamin D3 1000 units Caps  Take 1,000 Units by mouth daily.      The patient has been discharged on:   1.Beta Blocker:  Yes [  x ]                              No   [   ]                              If No, reason:  2.Ace Inhibitor/ARB: Yes [   ]  No  [   x ]                                     If No, reason: Labile BP  3.Statin:   Yes [ x  ]                  No  [   ]                  If No, reason:  4.Ecasa:  Yes  [  x ]                  No   [   ]                  If No, reason:  Follow Up Appointments: Follow-up Information    Follow up with [name], MD On 09/16/2015.   Specialty:  Cardiothoracic Surgery   Why:  PA/LAT CXR to be taken (at  [facility] which is in the same building as Dr. Vivi Martens office) on 09/16/2015 at 8:45 am;Appointment time is at 9:30 am   Contact information:   [place] [address] [phone]       Follow up with Sinclair Grooms, MD.   Specialty:  Cardiology   Why:  Call for an office appointment for 2 weeks   Contact information:   [id number] [address] [phone]       Follow up with SNF.   Why:  Please remove chest tube sutures on 08/25/2015      Follow up with [name], MD.   Specialties:  Vascular Surgery, Cardiology   Why:  Call for follow up appointment   Contact information:   [place] [address] [phone]       Signed: Lars Pinks MPA-C 08/21/2015, 8:38 AM

## 2015-08-18 NOTE — Progress Notes (Signed)
Pt sitting on side of bed eating, HR 99-140's paged Donielle NP. See orders. Pt asymptomatic, Amio gtt started per NP order. Vicente Males Therapist, sports

## 2015-08-18 NOTE — Progress Notes (Signed)
CSW attempted to call [facility] to discuss SNF placement- [facility] is closed today until 12pm but CSW was able to speak to on call RN who reports plan is for pt to transfer to [facility] when pt is medically stable  CSW will follow up with [facility] worker Sindy Messing at 12pm to get approval for SNF stay  Domenica Reamer, [place] Worker [phone]

## 2015-08-18 NOTE — Progress Notes (Signed)
Pt converted from A-FIB to NSR. Amio gtt still at 33.3 ml/hr. Vicente Males Therapist, sports

## 2015-08-18 NOTE — Progress Notes (Addendum)
      [address]       [place] [address]             [phone]        5 Days Post-Op Procedure(s) (LRB): CORONARY ARTERY BYPASS GRAFTING (CABG), ON PUMP, TIMES THREE, USING LEFT INTERNAL MAMMARY ARTERY, RIGHT GREATER SAPHENOUS VEIN HARVESTED ENDOSCOPICALLY (N/A) TRANSESOPHAGEAL ECHOCARDIOGRAM (TEE) (N/A) ENDARTERECTOMY CAROTID (Left)  Subjective: Patient with incisional pain this am  Objective: Vital signs in last 24 hours: Temp:  [97.8 F (36.6 C)-98.1 F (36.7 C)] 97.8 F (36.6 C) (01/10 0449) Pulse Rate:  [58-68] 59 (01/10 0449) Cardiac Rhythm:  [-] Normal sinus rhythm;Bundle branch block (01/09 1900) Resp:  [14-22] 18 (01/10 0449) BP: (87-150)/(60-82) 118/60 mmHg (01/10 0449) SpO2:  [93 %-99 %] 96 % (01/10 0449) Weight:  [202 lb 9.6 oz (91.9 kg)] 202 lb 9.6 oz (91.9 kg) (01/10 0449)  Pre op weight 92 kg Current Weight  08/18/15 202 lb 9.6 oz (91.9 kg)      Intake/Output from previous day: 01/09 0701 - 01/10 0700 In: 480 [P.O.:480] Out: 650 [Urine:650]   Physical Exam:  Cardiovascular: RRR Pulmonary: Slightly diminished at bases bilaterally; no rales, wheezes, or rhonchi. Abdomen: Soft, non tender, bowel sounds present. Extremities: Mild bilateral lower extremity edema. Wounds: Clean and dry.  No erythema or signs of infection.  Lab Results: CBC: Recent Labs  08/16/15 0423 08/17/15 0357  WBC 9.5 10.4  HGB 8.2* 8.4*  HCT 27.3* 27.6*  PLT 197 257   BMET:  Recent Labs  08/16/15 0423 08/17/15 0357  NA 141 137  K 4.2 4.2  CL 104 101  CO2 29 29  GLUCOSE 155* 199*  BUN 21* 27*  CREATININE 1.06 1.06  CALCIUM 8.7* 8.6*    PT/INR:  Lab Results  Component Value Date   INR 1.13 08/17/2015   INR 1.37 08/13/2015   INR 1.06 08/11/2015   ABG:  INR: Will add last result for INR, ABG once components are confirmed Will add last 4 CBG results once components are confirmed  Assessment/Plan:  1. CV - History of PAF and on  Pradaxa pre op. Maintaining SR in the 60's. On Amiodarone 400 mg bid and Lopressor 12.5 mg bid. Will restart Pradaxa in am. HR in low 60's so will decrease Amiodarone to 200 mg bid. 2.  Pulmonary - Encourage incentive spirometer 3. Volume Overload - On Lasix 40 mg daily 4.  Acute blood loss anemia - H and H yesterday stable at 8.4 and 27.6. Continue [place]on. 5. DM-CBGs 140/109/112. On Insulin. Will restart Metformin as taken pre op and decrease scheduled Insulin. Pre op HGA1C 5.8. 6. Remove EPW 7. Will likely need SNF once ready for discharge. He is part of the PACES program so social work to determine if eligibile for SNF.  ZIMMERMAN,DONIELLE MPA-C 08/18/2015,8:27 AM   Chart reviewed, patient examined, agree with above. He is maintaining sinus rhythm. Plan to resume Pradaxa tomorrow after pacing wires removed today. His diabetes was under good control preop on diet restriction, Metformin and Lantus. Will resume Metformin and decrease Levemir.

## 2015-08-18 NOTE — Progress Notes (Signed)
Per NP order pacing wires pulled and sutures removed. Pt tolerated well and is in bed. Pt educated the need of bedrest and frequent vitals. Understanding noted by pt. Vital signs being monitored. Will continue to monitor. Vicente Males Therapist, sports

## 2015-08-18 NOTE — Progress Notes (Signed)
Pharmacy to identify drug-drug interactions and recommend medication adjustments for those altered by adding amiodarone to the medication regimen (i.e. statins, digoxin, oral hypoglycemics, warfarin.) Amiodarone Drug - Drug Interaction Consult Note  Recommendations:   Amiodarone is metabolized by the cytochrome P450 system and therefore has the potential to cause many drug interactions. Amiodarone has an average plasma half-life of 50 days (range 20 to 100 days).   There is potential for drug interactions to occur several weeks or months after stopping treatment and the onset of drug interactions may be slow after initiating amiodarone.   [x]  Statins: Increased risk of myopathy. Simvastatin- restrict dose to 20mg  daily. Other statins: counsel patients to report any muscle pain or weakness immediately.  []  Anticoagulants: Amiodarone can increase anticoagulant effect. Consider warfarin dose reduction. Patients should be monitored closely and the dose of anticoagulant altered accordingly, remembering that amiodarone levels take several weeks to stabilize. Dabigatran:  The [place] official package labeling states that coadministration of amiodarone and dabigatran does not require a dose adjustment in patients with normal renal function. Avoid coadministration of dabigatran and amiodarone in patients with severe renal impairment   []  Antiepileptics: Amiodarone can increase plasma concentration of phenytoin, the dose should be reduced. Note that small changes in phenytoin dose can result in large changes in levels. Monitor patient and counsel on signs of toxicity.  [x]  Beta blockers: increased risk of bradycardia, AV block and myocardial depression. Sotalol - avoid concomitant use.  []   Calcium channel blockers (diltiazem and verapamil): increased risk of bradycardia, AV block and myocardial depression.  []   Cyclosporine: Amiodarone increases levels of cyclosporine. Reduced dose of cyclosporine is  recommended.  []  Digoxin dose should be halved when amiodarone is started.  [x]  Diuretics: increased risk of cardiotoxicity if hypokalemia occurs.  []  Oral hypoglycemic agents (glyburide, glipizide, glimepiride): increased risk of hypoglycemia. Patient's glucose levels should be monitored closely when initiating amiodarone therapy.   []  Drugs that prolong the QT interval:  Torsades de pointes risk may be increased with concurrent use - avoid if possible.  Monitor QTc, also keep magnesium/potassium WNL if concurrent therapy can't be avoided. Marland Kitchen Antibiotics: e.g. fluoroquinolones, erythromycin. . Antiarrhythmics: e.g. quinidine, procainamide, disopyramide, sotalol. . Antipsychotics: e.g. phenothiazines, haloperidol.  . Lithium, tricyclic antidepressants, and methadone.  Thank You,  Nicole Cella, RPh Clinical Pharmacist 08/18/2015 5:54 PM

## 2015-08-18 NOTE — NC FL2 (Signed)
[place] LEVEL OF CARE SCREENING TOOL     IDENTIFICATION  Patient Name: [name] Birthdate: 1955-10-14 Sex: male Admission Date (Current Location): 08/13/2015  [facility] and [place] Number:  Herbalist and Address:  [facility] [place], [place] [address] [place], [place] [address]      Provider Number: [id number]  Attending Physician Name and Address:  Ivin Poot, MD  Relative Name and Phone Number:       Current Level of Care: Hospital Recommended Level of Care: [place] Prior Approval Number:    Date Approved/Denied:   PASRR Number:    Discharge Plan: SNF    Current Diagnoses: Patient Active Problem List   Diagnosis Date Noted  . CAD (coronary artery disease) 07/14/2015  . Chronic combined systolic and diastolic CHF (congestive heart failure) ([place]) 07/14/2015  . Abnormal myocardial perfusion study 04/30/2015  . COPD with acute exacerbation ([place]) 11/03/2014  . Chronic diastolic CHF (congestive heart failure) ([place]) 10/26/2014  . Acute diastolic CHF (congestive heart failure) ([place]) 10/26/2014  . Iron deficiency anemia 10/26/2014  . Abrasion of abdominal wall 10/26/2014  . Abdominal hernia 10/26/2014  . Acute respiratory failure with hypoxia ([place]) 10/26/2014  . Acute on chronic respiratory failure ([place]) 10/26/2014  . Demand ischemia ([place])   . AKI (acute kidney injury) ([place])   . Gastritis and gastroduodenitis 06/10/2014  . Benign neoplasm of ascending colon 06/10/2014  . 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 Wound, open, foot 12/13/2012  . IDA (iron deficiency anemia) 12/13/2012  . Chest tightness 08/29/2012  . Heart murmur, systolic [id number]  . Hyperkalemia 05/22/2012  . Diabetic leg ulcer ([place]) 05/15/2012  . Routine health maintenance 05/15/2012  . Leg weakness, bilateral 12/10/2011  . Carotid stenosis 08/31/2011  . Sciatica 05/22/2011  . Ventral hernia  01/24/2011  . Incidental lung nodule, greater than or equal to 58mm 11/24/2010  . CVA (cerebral vascular accident) ([place]) 10/24/2010  . ANXIETY 06/22/2007  . DM (diabetes mellitus), type 2, uncontrolled ([place]) 12/22/2006  . Essential hypertension 12/22/2006  . HYPERCHOLESTEROLEMIA 10/05/2006  . TOBACCO DEPENDENCE 10/05/2006  . MENTAL RETARDATION 10/05/2006  . COPD 10/05/2006    Orientation RESPIRATION BLADDER Height & Weight    Self, Time, Situation, Place  Normal Continent 5\' 5"  (165.1 cm) 202 lbs.  BEHAVIORAL SYMPTOMS/MOOD NEUROLOGICAL BOWEL NUTRITION STATUS      Continent Diet (cardiac)  AMBULATORY STATUS COMMUNICATION OF NEEDS Skin   Limited Assist Verbally Surgical wounds                       Personal Care Assistance Level of Assistance  Bathing, Dressing Bathing Assistance: Limited assistance   Dressing Assistance: Limited assistance     Functional Limitations Info             SPECIAL CARE FACTORS FREQUENCY  PT (By licensed PT)     PT Frequency: 5/wk              Contractures      Additional Factors Info  Code Status, Allergies, Insulin Sliding Scale Code Status Info: FULL Allergies Info: Penicillins   Insulin Sliding Scale Info: 3/day       Current Medications (08/18/2015):  This is the current hospital active medication list Current Facility-Administered Medications  Medication Dose Route Frequency Provider Last Rate Last Dose  . 0.9 %  sodium chloride infusion  250 mL Intravenous PRN Fernande Boyden  Bartle, MD      . acetaminophen (TYLENOL) tablet 650 mg  650 mg Oral Q6H PRN [name], MD   650 mg at 08/18/15 0131  . amiodarone (PACERONE) tablet 200 mg  200 mg Oral BID PC Donielle Liston Alba, PA-C   200 mg at 08/18/15 [id number]  . antiseptic oral rinse (CPC / CETYLPYRIDINIUM CHLORIDE 0.05%) solution 7 mL  7 mL Mouth Rinse BID [name], MD   7 mL at 08/18/15 1000  . aspirin EC tablet 325 mg  325 mg Oral Daily [name], MD   325 mg at  08/18/15 1019  . atorvastatin (LIPITOR) tablet 80 mg  80 mg Oral q1800 Nani Skillern, PA-C   80 mg at 08/17/15 1730  . clonazePAM (KLONOPIN) tablet 0.5 mg  0.5 mg Oral QHS Nani Skillern, PA-C   0.5 mg at 08/17/15 2252  . [START ON 08/19/2015] dabigatran (PRADAXA) capsule 150 mg  150 mg Oral Q12H Donielle M Zimmerman, PA-C      . enoxaparin (LOVENOX) injection 40 mg  40 mg Subcutaneous QHS Ivin Poot, MD   40 mg at 08/17/15 2253  . famotidine (PEPCID) tablet 20 mg  20 mg Oral BID [name], MD   20 mg at 08/18/15 1019  . ferrous gluconate (FERGON) tablet 324 mg  324 mg Oral Q breakfast Nani Skillern, PA-C   324 mg at 08/18/15 [id number]  . furosemide (LASIX) tablet 40 mg  40 mg Oral Daily [name], MD   40 mg at 08/18/15 1019  . gabapentin (NEURONTIN) capsule 300 mg  300 mg Oral BID Nani Skillern, PA-C   300 mg at 08/18/15 1019  . insulin aspart (novoLOG) injection 0-20 Units  0-20 Units Subcutaneous TID WC Rexene [place], MD   3 Units at 08/18/15 1212  . insulin detemir (LEVEMIR) injection 15 Units  15 Units Subcutaneous Daily Nani Skillern, PA-C   15 Units at 08/18/15 1020  . metFORMIN (GLUCOPHAGE) tablet 1,000 mg  1,000 mg Oral BID WC Donielle Liston Alba, PA-C      . metoprolol tartrate (LOPRESSOR) tablet 12.5 mg  12.5 mg Oral BID Rexene [place], MD   12.5 mg at 08/18/15 1019  . ondansetron (ZOFRAN) tablet 4 mg  4 mg Oral Q6H PRN [name], MD       Or  . ondansetron (ZOFRAN) injection 4 mg  4 mg Intravenous Q6H PRN [name], MD      . PARoxetine (PAXIL) tablet 40 mg  40 mg Oral Daily Donielle Liston Alba, PA-C   40 mg at 08/18/15 1019  . pneumococcal 23 valent vaccine (PNU-IMMUNE) injection 0.5 mL  0.5 mL Intramuscular Prior to discharge Ivin Poot, MD      . potassium chloride SA (K-DUR,KLOR-CON) CR tablet 20 mEq  20 mEq Oral Daily [name], MD   20 mEq at 08/18/15 1019  . saccharomyces boulardii (FLORASTOR) capsule 250 mg   250 mg Oral BID Nani Skillern, PA-C   250 mg at 08/18/15 1019  . sodium chloride 0.9 % injection 3 mL  3 mL Intravenous Q12H [name], MD   3 mL at 08/18/15 1000  . sodium chloride 0.9 % injection 3 mL  3 mL Intravenous PRN [name], MD      . traMADol Veatrice Bourbon) tablet 50-100 mg  50-100 mg Oral Q4H PRN [name], MD   100 mg  at 08/18/15 [id number]     Discharge Medications: Please see discharge summary for a list of discharge medications.  Relevant Imaging Results:  Relevant Lab Results:   Additional Information SS#: [id number]  Cranford Mon, [place]

## 2015-08-18 NOTE — Progress Notes (Signed)
Nurse paged me because patient is napping in bed and he is back in a fib with HR up to the 140's. I ordered an Amiodarone bolus 150 mg IV once, increase Amiodarone to 400 mg bid, and increase Lopressor to 25 mg bid. He only has a peripheral IV so I am hesitant to put on Amiodarone drip. Nurse instructed to page in [place] stays at/above 110.

## 2015-08-18 NOTE — Progress Notes (Signed)
CARDIAC REHAB PHASE I   PRE:  Rate/Rhythm: 66 SR  BP:  Supine:   Sitting: 126/68  Standing:    SaO2: 95%RA  MODE:  Ambulation: 150 ft   POST:  Rate/Rhythm: 71 SR  BP:  Supine:   Sitting: 131/49  Standing:    SaO2: 90-92%RA 1310-1345 Pt walked 150 ft on RA with gait belt use, rolling walker and asst x 1. Encouraged not to put weight on arms. Pt stopped twice to rest standing. Tired by end of walk. To recliner with call bell. Got coffee for pt. Needs to stay close to walker and more in middle of hall. Tendency to get close to wall.   Graylon Good, RN BSN  08/18/2015 1:41 PM

## 2015-08-19 LAB — BASIC METABOLIC PANEL
Anion gap: 8 (ref 5–15)
BUN: 31 mg/dL — AB (ref 6–20)
CALCIUM: 8.3 mg/dL — AB (ref 8.9–10.3)
CHLORIDE: 100 mmol/L — AB (ref 101–111)
CO2: 28 mmol/L (ref 22–32)
CREATININE: 1.22 mg/dL (ref 0.61–1.24)
GFR calc non Af Amer: >60 mL/min (ref >60)
Glucose, Bld: 112 mg/dL — ABNORMAL HIGH (ref 65–99)
Potassium: 4.3 mmol/L (ref 3.5–5.1)
SODIUM: 136 mmol/L (ref 135–145)

## 2015-08-19 LAB — GLUCOSE, CAPILLARY
GLUCOSE-CAPILLARY: 105 mg/dL — AB (ref 65–99)
GLUCOSE-CAPILLARY: 99 mg/dL (ref 65–99)
GLUCOSE-CAPILLARY: 117 mg/dL — AB (ref 65–99)
GLUCOSE-CAPILLARY: 103 mg/dL — AB (ref 65–99)
GLUCOSE-CAPILLARY: 85 mg/dL (ref 65–99)
Glucose-Capillary: 92 mg/dL (ref 65–99)

## 2015-08-19 MED ORDER — DIGOXIN 0.25 MG/ML IJ SOLN
0.2500 mg | Freq: Once | INTRAMUSCULAR | Status: AC
  Administered 2015-08-19: 0.25 mg via INTRAVENOUS
  Filled 2015-08-19: qty 1

## 2015-08-19 MED ORDER — DIGOXIN 0.25 MG/ML IJ SOLN
0.5000 mg | Freq: Once | INTRAMUSCULAR | Status: AC
  Administered 2015-08-19: 0.5 mg via INTRAVENOUS
  Filled 2015-08-19: qty 2

## 2015-08-19 MED ORDER — METOPROLOL TARTRATE 25 MG PO TABS
25.0000 mg | ORAL_TABLET | Freq: Three times a day (TID) | ORAL | Status: DC
  Administered 2015-08-19 – 2015-08-21 (×5): 25 mg via ORAL
  Filled 2015-08-19 (×5): qty 1

## 2015-08-19 MED ORDER — DIGOXIN 125 MCG PO TABS
0.2500 mg | ORAL_TABLET | Freq: Every day | ORAL | Status: DC
  Administered 2015-08-20 – 2015-08-21 (×2): 0.25 mg via ORAL
  Filled 2015-08-19 (×2): qty 2

## 2015-08-19 MED ORDER — AMIODARONE HCL 200 MG PO TABS
200.0000 mg | ORAL_TABLET | Freq: Two times a day (BID) | ORAL | Status: DC
  Administered 2015-08-19: 200 mg via ORAL
  Filled 2015-08-19: qty 1

## 2015-08-19 MED ORDER — METOPROLOL TARTRATE 1 MG/ML IV SOLN
5.0000 mg | Freq: Once | INTRAVENOUS | Status: AC
  Administered 2015-08-19: 5 mg via INTRAVENOUS
  Filled 2015-08-19: qty 5

## 2015-08-19 MED ORDER — METOPROLOL TARTRATE 12.5 MG HALF TABLET
12.5000 mg | ORAL_TABLET | Freq: Two times a day (BID) | ORAL | Status: DC
  Filled 2015-08-19: qty 1

## 2015-08-19 NOTE — Progress Notes (Signed)
Patient ambulated in hallway x1 assist with walker, several standing rest breaks. Walks close to wall back in chair call bell with in reach. Will monitor patient. Nazarene Bunning, Bettina Gavia RN

## 2015-08-19 NOTE — Progress Notes (Signed)
CARDIAC REHAB PHASE I   PRE:  Rate/Rhythm: 57 SB  BP:  Supine:   Sitting: 125/75  Standing:    SaO2: 91%RA  MODE:  Ambulation: 150 ft   POST:  Rate/Rhythm: 63 SR  BP:  Supine:   Sitting: 134/52  Standing:    SaO2: 93%RA 0945-1015 Pt walked 150 ft on RA with gait belt use, rolling walker and asst x 1. Pt did not feel he could increase distance. Pt needs to be reminded not to use arms to stand and sit. Not aware of surroundings when walking and has tendency to get close to wall and sink. Stopped twice to rest. To recliner with call bell. PT to see later.   Graylon Good, RN BSN  08/19/2015 10:11 AM

## 2015-08-19 NOTE — Progress Notes (Addendum)
      [address]       [place] [address]             [phone]        6 Days Post-Op Procedure(s) (LRB): CORONARY ARTERY BYPASS GRAFTING (CABG), ON PUMP, TIMES THREE, USING LEFT INTERNAL MAMMARY ARTERY, RIGHT GREATER SAPHENOUS VEIN HARVESTED ENDOSCOPICALLY (N/A) TRANSESOPHAGEAL ECHOCARDIOGRAM (TEE) (N/A) ENDARTERECTOMY CAROTID (Left)  Subjective: Patient without specific complaints this am. He is in good spirits. According to the nurse, patient had some confusion last night and was pulling at IVs.  Objective: Vital signs in last 24 hours: Temp:  [97.5 F (36.4 C)-98.1 F (36.7 C)] 98.1 F (36.7 C) (01/11 0300) Pulse Rate:  [52-68] 52 (01/11 0300) Cardiac Rhythm:  [-] Normal sinus rhythm;Bundle branch block (01/10 1900) Resp:  [16-20] 16 (01/11 0300) BP: (110-167)/(49-75) 110/57 mmHg (01/11 0300) SpO2:  [96 %] 96 % (01/11 0300) Weight:  [203 lb 0.7 oz (92.1 kg)] 203 lb 0.7 oz (92.1 kg) (01/11 0551)  Pre op weight 92 kg Current Weight  08/19/15 203 lb 0.7 oz (92.1 kg)      Intake/Output from previous day: 01/10 0701 - 01/11 0700 In: 720 [P.O.:720] Out: 950 [Urine:950]   Physical Exam:  Cardiovascular: RRR Pulmonary: Slightly diminished at bases bilaterally; no rales, wheezes, or rhonchi. Abdomen: Soft, non tender, bowel sounds present. Extremities: Trace bilateral lower extremity edema. No sign of phlebitis. Wounds: Clean and dry.  No erythema or signs of infection. Neurologic: No focal deficits  Lab Results: CBC:  Recent Labs  08/17/15 0357  WBC 10.4  HGB 8.4*  HCT 27.6*  PLT 257   BMET:   Recent Labs  08/17/15 0357 08/19/15 0348  NA 137 136  K 4.2 4.3  CL 101 100*  CO2 29 28  GLUCOSE 199* 112*  BUN 27* 31*  CREATININE 1.06 1.22  CALCIUM 8.6* 8.3*    PT/INR:  Lab Results  Component Value Date   INR 1.13 08/17/2015   INR 1.37 08/13/2015   INR 1.06 08/11/2015   ABG:  INR: Will add last result for INR, ABG once  components are confirmed Will add last 4 CBG results once components are confirmed  Assessment/Plan:  1. CV - History of PAF and on Pradaxa pre op.He was SR yesterday morning but went back into a fib with RVR yesterday afternoon. He was put on an Amiodarone drip. Drip stopped this am. He remains in SR in the 50's this am. On Lopressor 25 mg bid. Will decrease Lopressor to 12.5 mg bid and give Amiodarone 200 mg bid. Will restart Pradaxa this am as well. 2.  Pulmonary - On room air.Encourage incentive spirometer 3. Volume Overload - On Lasix 40 mg daily 4.  Acute blood loss anemia - H and H yesterday stable at 8.4 and 27.6. Continue Fergon. 5. DM-CBGs 113/105/99. Continue Metformin as taken pre op and Insulin. Pre op HGA1C 5.8. 6. Will need SNF once ready for discharge. He is part of the PACES program and has been approved for [facility]. He will need to remain here until a fib rate better controlled.  ZIMMERMAN,DONIELLE MPA-C 08/19/2015,7:50 AM    Chart reviewed, patient examined, agree with above. He feels better today and back in sinus in the 50's. Will start Pradaxa and watch rhythm today. If fairly stable he can to to SNF tomorrow.

## 2015-08-19 NOTE — Progress Notes (Signed)
Physical Therapy Treatment Patient Details Name: SHELLY RAGA MRN: [id number] DOB: 1956/05/05 Today's Date: 08/19/2015    History of Present Illness Pt is a 60 y.o. male s/p CABG 08-13-15. PMH consists of R midfoot amputation, large ventral hernia, DM, hypertension, hyperlipidemia, PAF, previous smoking, prior stroke and cerebrovascular disease and mental retardation.    PT Comments    Mobility improving steadily.  Pt is short enough that standing from chair and bed is not difficult without  UE's.   Activity tolerance is improving well also.  Pt will still need 24 hour assist initially until for safety.   Follow Up Recommendations  Home health PT;Supervision/Assistance - 24 hour     Equipment Recommendations  None recommended by PT    Recommendations for Other Services       Precautions / Restrictions Precautions Precautions: Fall;Sternal Precaution Comments: Sternal precautions reinforced    Mobility  Bed Mobility               General bed mobility comments: in bathroom on arrival  Transfers Overall transfer level: Needs assistance Equipment used: Rolling walker (2 wheeled) Transfers: Sit to/from Stand Sit to Stand: Min assist         General transfer comment: cues for sternal precaution and reinforced hand placement  Ambulation/Gait Ambulation/Gait assistance: Min guard Ambulation Distance (Feet): 220 Feet Assistive device: Rolling walker (2 wheeled) Gait Pattern/deviations: Step-through pattern;Decreased step length - right;Decreased step length - left;Decreased stride length Gait velocity: generally slower Gait velocity interpretation: Below normal speed for age/gender General Gait Details: Generally steady, able to maneuver the RW around obstacles without assist/  Sats on RA  93% and EHR 89-92 bpm   Stairs            Wheelchair Mobility    Modified Rankin (Stroke Patients Only)       Balance Overall balance assessment: Needs  assistance   Sitting balance-Leahy Scale: Fair       Standing balance-Leahy Scale: Fair                      Cognition Arousal/Alertness: Awake/alert Behavior During Therapy: WFL for tasks assessed/performed Overall Cognitive Status: History of cognitive impairments - at baseline                      Exercises      General Comments        Pertinent Vitals/Pain Pain Assessment: Faces Faces Pain Scale: No hurt    Home Living                      Prior Function            PT Goals (current goals can now be found in the care plan section) Acute Rehab PT Goals Patient Stated Goal: not stated PT Goal Formulation: With patient Time For Goal Achievement: 08/30/15 Potential to Achieve Goals: Good Progress towards PT goals: Progressing toward goals    Frequency  Min 3X/week    PT Plan Current plan remains appropriate    Co-evaluation             End of Session   Activity Tolerance: Patient tolerated treatment well Patient left: in chair;with call bell/phone within reach     Time: [id number] PT Time Calculation (min) (ACUTE ONLY): 22 min  Charges:  $Gait Training: 8-22 mins  G Codes:      Kazimierz Springborn, Tessie Fass 08/19/2015, 4:42 PM 08/19/2015  Donnella Sham, [place] [phone]  (pager)

## 2015-08-19 NOTE — Progress Notes (Signed)
Pt is mildly confused this morning. Found pt to have removed tele monitor and pulled out a second iv site. Pt reorientable but sleepy.

## 2015-08-19 NOTE — Progress Notes (Signed)
Patient's heart rate is now 52 in sinus rhythm. BP 110/57. Called Dr. Prescott Gum  per orders b/c pulse below 60. Received orders to discontinue IV Amiodarone.

## 2015-08-19 NOTE — Progress Notes (Signed)
Pt HR has been running in the 50' to 36' pt went back into afib 120's-140's. MD notified. Dr ordered 5mg  of metoprolol and .25 of digoxin. Pt blood pressure was 129/90 and Hr on EKG 123. Will continue to monitor pt. Pt is asymptomatic.

## 2015-08-20 LAB — BASIC METABOLIC PANEL
ANION GAP: 8 (ref 5–15)
BUN: 33 mg/dL — ABNORMAL HIGH (ref 6–20)
CO2: 29 mmol/L (ref 22–32)
Calcium: 8.5 mg/dL — ABNORMAL LOW (ref 8.9–10.3)
Chloride: 101 mmol/L (ref 101–111)
Creatinine, Ser: 1.18 mg/dL (ref 0.61–1.24)
GLUCOSE: 95 mg/dL (ref 65–99)
POTASSIUM: 4.9 mmol/L (ref 3.5–5.1)
SODIUM: 138 mmol/L (ref 135–145)

## 2015-08-20 LAB — GLUCOSE, CAPILLARY
GLUCOSE-CAPILLARY: 102 mg/dL — AB (ref 65–99)
GLUCOSE-CAPILLARY: 76 mg/dL (ref 65–99)
Glucose-Capillary: 60 mg/dL — ABNORMAL LOW (ref 65–99)
Glucose-Capillary: 65 mg/dL (ref 65–99)
Glucose-Capillary: 85 mg/dL (ref 65–99)

## 2015-08-20 MED ORDER — GUAIFENESIN ER 600 MG PO TB12
600.0000 mg | ORAL_TABLET | Freq: Two times a day (BID) | ORAL | Status: DC
  Administered 2015-08-20 – 2015-08-21 (×3): 600 mg via ORAL
  Filled 2015-08-20 (×3): qty 1

## 2015-08-20 MED ORDER — ASPIRIN EC 81 MG PO TBEC
81.0000 mg | DELAYED_RELEASE_TABLET | Freq: Every day | ORAL | Status: DC
  Administered 2015-08-20 – 2015-08-21 (×2): 81 mg via ORAL
  Filled 2015-08-20 (×2): qty 1

## 2015-08-20 MED ORDER — INSULIN DETEMIR 100 UNIT/ML ~~LOC~~ SOLN
8.0000 [IU] | Freq: Every day | SUBCUTANEOUS | Status: DC
  Administered 2015-08-20: 8 [IU] via SUBCUTANEOUS
  Filled 2015-08-20 (×2): qty 0.08

## 2015-08-20 MED ORDER — METOPROLOL TARTRATE 1 MG/ML IV SOLN
5.0000 mg | Freq: Once | INTRAVENOUS | Status: AC
  Administered 2015-08-20: 5 mg via INTRAVENOUS
  Filled 2015-08-20: qty 5

## 2015-08-20 NOTE — Progress Notes (Signed)
08/20/2015 1:28 AM Pt converted back to sinus rhythm after given 5 mg Metoprolol IV.  Heart rate is 58.  Will continue to monitor pt. Lupita Dawn, RN

## 2015-08-20 NOTE — Progress Notes (Signed)
08/20/2015 12:58 AM Pt went into AFib after receiving 0.25 mg Digoxin.  HR mostly 110s to 120s though going up as high as 139.  Pt is asymptomatic.  BP is at 119/94.  Dr. Cyndia Bent was paged and ordered a one time dose of Metoprolol 5 mg IV.  Will continue to monitor pt. Lupita Dawn, RN

## 2015-08-20 NOTE — Progress Notes (Signed)
Utilization review completed.  

## 2015-08-20 NOTE — Progress Notes (Signed)
CARDIAC REHAB PHASE I   PRE:  Rate/Rhythm: 55 SB    BP: sitting 110/54    SaO2: 96 RA  MODE:  Ambulation: 180 ft   POST:  Rate/Rhythm: 64 SR    BP: sitting 120/50     SaO2: 93 RA  Pt moving on his own however he does not follow sternal precautions all the time. Gave instructions but he did not respond. To BR for BM and declined assistance. Increased distance slightly with x2 brief rest stops for fatigue on return to room. Did not see any afib. Will f/u tomorrow. [place], [place], ACSM 08/20/2015 11:57 AM

## 2015-08-20 NOTE — Progress Notes (Signed)
[name] is still following patient and can accept when medically stable- per physician note anticipate could be ready 1/13  CSw will continue to follow  Domenica Reamer, [place] Social Worker [phone]

## 2015-08-20 NOTE — Progress Notes (Signed)
Pt returned to Afib, 110-120's.  PA paged.  Verbal order to administer lopressor early if pt BP is within parameters.  MD and PA aware of pt tendency to go into Afib, call back if rate goes above 130.

## 2015-08-20 NOTE — Progress Notes (Addendum)
      [address]       [place] [address]             [phone]        7 Days Post-Op Procedure(s) (LRB): CORONARY ARTERY BYPASS GRAFTING (CABG), ON PUMP, TIMES THREE, USING LEFT INTERNAL MAMMARY ARTERY, RIGHT GREATER SAPHENOUS VEIN HARVESTED ENDOSCOPICALLY (N/A) TRANSESOPHAGEAL ECHOCARDIOGRAM (TEE) (N/A) ENDARTERECTOMY CAROTID (Left)  Subjective: Patient without specific complaints this am.   Objective: Vital signs in last 24 hours: Temp:  [97.3 F (36.3 C)-97.7 F (36.5 C)] 97.5 F (36.4 C) (01/12 0607) Pulse Rate:  [56-119] 56 (01/12 0607) Cardiac Rhythm:  [-] Sinus bradycardia (01/12 0121) Resp:  [15-18] 15 (01/12 0607) BP: (116-148)/(52-94) 148/56 mmHg (01/12 0607) SpO2:  [92 %-96 %] 93 % (01/12 0607) Weight:  [201 lb 11.2 oz (91.491 kg)] 201 lb 11.2 oz (91.491 kg) (01/12 0607)  Pre op weight 92 kg Current Weight  08/20/15 201 lb 11.2 oz (91.491 kg)      Intake/Output from previous day: 01/11 0701 - 01/12 0700 In: 840 [P.O.:840] Out: -    Physical Exam:  Cardiovascular: RRR Pulmonary: Slightly diminished at bases bilaterally; no rales, wheezes, or rhonchi. Abdomen: Soft, non tender, bowel sounds present. Extremities: Trace bilateral lower extremity edema. No sign of arm phlebitis. Wounds: Clean and dry.  No erythema or signs of infection.   Lab Results: CBC: No results for input(s): WBC, HGB, HCT, PLT in the last 72 hours. BMET:   Recent Labs  08/19/15 0348 08/20/15 0341  NA 136 138  K 4.3 4.9  CL 100* 101  CO2 28 29  GLUCOSE 112* 95  BUN 31* 33*  CREATININE 1.22 1.18  CALCIUM 8.3* 8.5*    PT/INR:  Lab Results  Component Value Date   INR 1.13 08/17/2015   INR 1.37 08/13/2015   INR 1.06 08/11/2015   ABG:  INR: Will add last result for INR, ABG once components are confirmed Will add last 4 CBG results once components are confirmed  Assessment/Plan:  1. CV - History of PAF and on Pradaxa pre op.He is in SR/SB  in the 50's this am. On Lopressor 25 mg tid, Digoxin 0.25 mg daily, and Pradaxa 150 mg bid. Will check Digoxin level in am. 2.  Pulmonary - On room air.Mucinex for cough.Encourage incentive spirometer 3. Volume Overload - On Lasix 40 mg daily. Will stop after today as below pre op weight. 4.  Acute blood loss anemia - H and H yesterday stable at 8.4 and 27.6. Continue Fergon. 5. DM-CBGs 103/85/60. Continue Metformin as taken pre op and will decrease Insulin to avoid hypoglycemia. Pre op HGA1C 5.8. 6. Will monitor rhythm today and hope to transfer to SNF in am  ZIMMERMAN,DONIELLE MPA-C 08/20/2015,7:54 AM    Chart reviewed, patient examined, agree with above. He is in sinus most of the time but has episodes of atrial fib with RVR 120's not really associated with any activity. IV lopressor was given last night twice to slow him down and convert. I decided to stop the amiodarone since it was not maintaining sinus or controlling rate. Will continue Lopressor and Digoxin added with goal of rate control. His baseline sinus rhythm is in the 50's so there is a limit to lopressor. If rate controlled today will plan to send to SNF tomorrow.

## 2015-08-20 NOTE — Care Management Note (Signed)
Case Management Note Previous CM note initiated by Luz Lex RN, CM  Patient Details  Name: COLEMAN QUAYE MRN: [id number] Date of Birth: Feb 08, 1956  Subjective/Objective:    Lives at home with roommate.  Roommate works nights, no one else around.  Has sister, who he would like [place] to call.  Talked with sister, she states he has been at [name] in past and would like him to return there post discharge.  SW referral made.                Action/Plan:   Expected Discharge Date:                  Expected Discharge Plan:  Skilled Nursing Facility  In-House Referral:  Clinical Social Work  Discharge planning Services  CM Consult  Post Acute Care Choice:    Choice offered to:     DME Arranged:    DME Agency:     HH Arranged:    [place] Agency:     Status of Service:  In process, will continue to follow  Medicare Important Message Given:  Yes Date Medicare IM Given:    Medicare IM give by:    Date Additional Medicare IM Given:    Additional Medicare Important Message give by:     If discussed at [place] of Stay Meetings, dates discussed:  08/20/15  Additional Comments:  08/20/15- Marvetta Gibbons RN, BSN - d/c planning for SNF- [facility] for [facility]/c  Dawayne Patricia, RN 08/20/2015, 10:45 AM

## 2015-08-21 LAB — BASIC METABOLIC PANEL
Anion gap: 7 (ref 5–15)
BUN: 32 mg/dL — AB (ref 6–20)
CO2: 31 mmol/L (ref 22–32)
Calcium: 8.7 mg/dL — ABNORMAL LOW (ref 8.9–10.3)
Chloride: 99 mmol/L — ABNORMAL LOW (ref 101–111)
Creatinine, Ser: 1.21 mg/dL (ref 0.61–1.24)
GFR calc Af Amer: >60 mL/min (ref >60)
GLUCOSE: 80 mg/dL (ref 65–99)
POTASSIUM: 5.2 mmol/L — AB (ref 3.5–5.1)
Sodium: 137 mmol/L (ref 135–145)

## 2015-08-21 LAB — CBC
HCT: 28.2 % — ABNORMAL LOW (ref 39.0–52.0)
Hemoglobin: 8.7 g/dL — ABNORMAL LOW (ref 13.0–17.0)
MCH: 25.7 pg — ABNORMAL LOW (ref 26.0–34.0)
MCHC: 30.9 g/dL (ref 30.0–36.0)
MCV: 83.2 fL (ref 78.0–100.0)
Platelets: 341 10*3/uL (ref 150–400)
RBC: 3.39 MIL/uL — ABNORMAL LOW (ref 4.22–5.81)
RDW: 14.6 % (ref 11.5–15.5)
WBC: 8.2 10*3/uL (ref 4.0–10.5)

## 2015-08-21 LAB — GLUCOSE, CAPILLARY: GLUCOSE-CAPILLARY: 79 mg/dL (ref 65–99)

## 2015-08-21 LAB — DIGOXIN LEVEL: Digoxin Level: 1.5 ng/mL (ref 0.8–2.0)

## 2015-08-21 MED ORDER — INSULIN DETEMIR 100 UNIT/ML ~~LOC~~ SOLN
4.0000 [IU] | Freq: Every day | SUBCUTANEOUS | Status: DC
  Administered 2015-08-21: 4 [IU] via SUBCUTANEOUS
  Filled 2015-08-21: qty 0.04

## 2015-08-21 MED ORDER — HYDROCORTISONE 2.5 % EX OINT: TOPICAL_OINTMENT | Freq: Three times a day (TID) | CUTANEOUS | Status: DC

## 2015-08-21 MED ORDER — INSULIN ASPART 100 UNIT/ML ~~LOC~~ SOLN: 0.0000 [IU] | Freq: Three times a day (TID) | SUBCUTANEOUS | Status: DC

## 2015-08-21 MED ORDER — INSULIN GLARGINE 100 UNIT/ML ~~LOC~~ SOLN: 20.0000 [IU] | Freq: Two times a day (BID) | SUBCUTANEOUS | Status: DC

## 2015-08-21 MED ORDER — METOPROLOL TARTRATE 25 MG PO TABS: 25.0000 mg | ORAL_TABLET | Freq: Three times a day (TID) | ORAL | Status: DC

## 2015-08-21 MED ORDER — FERROUS GLUCONATE 324 (38 FE) MG PO TABS: 324.0000 mg | ORAL_TABLET | Freq: Every day | ORAL | Status: DC

## 2015-08-21 MED ORDER — ACETAMINOPHEN 325 MG PO TABS: 650.0000 mg | ORAL_TABLET | Freq: Four times a day (QID) | ORAL | Status: DC | PRN

## 2015-08-21 MED ORDER — DIGOXIN 125 MCG PO TABS: 0.1250 mg | ORAL_TABLET | Freq: Every day | ORAL | Status: DC

## 2015-08-21 MED ORDER — HYDROCODONE-ACETAMINOPHEN 5-325 MG PO TABS: 1.0000 | ORAL_TABLET | Freq: Four times a day (QID) | ORAL | Status: DC | PRN

## 2015-08-21 MED ORDER — DIGOXIN 250 MCG PO TABS: 0.1250 mg | ORAL_TABLET | Freq: Every day | ORAL | Status: DC

## 2015-08-21 NOTE — Progress Notes (Signed)
Patient will discharge to [facility] Anticipated discharge date: 08/21/15 Family notified: [name] (sister) Transportation by [employer]  CSW signing off.  Domenica Reamer, [place] Social Worker [phone]

## 2015-08-21 NOTE — Progress Notes (Signed)
Attempted to call report to [facility]. Nurse receiving the pt is off the unit. States the nurse will call when she returns.

## 2015-08-21 NOTE — Progress Notes (Signed)
[facility] called for report. Report given.

## 2015-08-21 NOTE — Progress Notes (Signed)
[id number] Pt for discharge. No family here for ed. Left diabetic and heart healthy diets for pt and family. Encouraged pt to watch carbs and salt. Encouraged him to walk with assistance and to use IS. Discussed CRP 2 and pt would like to consider CRP 2 in [place]. Stated [facility] might take him. Left brochure and will refer.Reviewed sternal precautions. Graylon Good RN BSN 08/21/2015 11:21 AM

## 2015-08-21 NOTE — Progress Notes (Signed)
      [address]       [place] [address]             [phone]        8 Days Post-Op Procedure(s) (LRB): CORONARY ARTERY BYPASS GRAFTING (CABG), ON PUMP, TIMES THREE, USING LEFT INTERNAL MAMMARY ARTERY, RIGHT GREATER SAPHENOUS VEIN HARVESTED ENDOSCOPICALLY (N/A) TRANSESOPHAGEAL ECHOCARDIOGRAM (TEE) (N/A) ENDARTERECTOMY CAROTID (Left)  Subjective: Patient without specific complaints this am. He is eating breakfast.   Objective: Vital signs in last 24 hours: Temp:  [97.4 F (36.3 C)-98 F (36.7 C)] 97.4 F (36.3 C) (01/13 0451) Pulse Rate:  [54-104] 57 (01/13 0451) Cardiac Rhythm:  [-] Sinus bradycardia;Bundle branch block (01/12 1954) Resp:  [16-20] 18 (01/13 0451) BP: (109-137)/(49-59) 137/50 mmHg (01/13 0451) SpO2:  [94 %-96 %] 96 % (01/13 0451) Weight:  [195 lb 4.8 oz (88.587 kg)] 195 lb 4.8 oz (88.587 kg) (01/13 0451)  Pre op weight 92 kg Current Weight  08/21/15 195 lb 4.8 oz (88.587 kg)      Intake/Output from previous day: 01/12 0701 - 01/13 0700 In: 960 [P.O.:960] Out: 1500 [Stool:1500]   Physical Exam:  Cardiovascular: RRR Pulmonary: Slightly diminished at bases bilaterally; no rales, wheezes, or rhonchi. Abdomen: Soft, non tender, bowel sounds present. Extremities: No lower extremity edema. No sign of arm phlebitis. Wounds: Clean and dry.  No erythema or signs of infection.   Lab Results: CBC:  Recent Labs  08/21/15 0340  WBC 8.2  HGB 8.7*  HCT 28.2*  PLT 341   BMET:   Recent Labs  08/20/15 0341 08/21/15 0340  NA 138 137  K 4.9 5.2*  CL 101 99*  CO2 29 31  GLUCOSE 95 80  BUN 33* 32*  CREATININE 1.18 1.21  CALCIUM 8.5* 8.7*    PT/INR:  Lab Results  Component Value Date   INR 1.13 08/17/2015   INR 1.37 08/13/2015   INR 1.06 08/11/2015   ABG:  INR: Will add last result for INR, ABG once components are confirmed Will add last 4 CBG results once components are confirmed  Assessment/Plan:  1. CV -  History of PAF and on Pradaxa pre op.He is in SR/SB in the 50's this am. On Lopressor 25 mg tid, Digoxin 0.25 mg daily, and Pradaxa 150 mg bid.  2.  Pulmonary - On room air.Mucinex for cough.Encourage incentive spirometer 3. Volume Overload - On Lasix 40 mg daily. Will stop as below pre op weight. 4.  Acute blood loss anemia - H and H yesterday stable at 8.7 and 28.2. Continue Fergon. 5. DM-CBGs 65/85/79. Continue Metformin as taken pre op and will decrease Insulin to avoid hypoglycemia. Pre op HGA1C 5.8. 6. Potassium up to 5.2. Not on supplement 7. Discharge to SNF  Cailey Trigueros MPA-C 08/21/2015,7:41 AM

## 2015-08-21 NOTE — Care Management Important Message (Signed)
Important Message  Patient Details  Name: [name] MRN: [id number] Date of Birth: 07-20-1956   Medicare Important Message Given:  Yes    Nathen May 08/21/2015, 11:12 AM

## 2015-08-21 NOTE — Discharge Instructions (Signed)
We ask the SNF to please do the following: 1. Please obtain vital signs at least one time daily 2.Please weigh the patient daily. If he or she continues to gain weight or develops lower extremity edema, contact the office at [phone]. 3. Ambulate patient at least three times daily and please use sternal precautions.  Coronary Artery Bypass Grafting, Care After These instructions give you information on caring for yourself after your procedure. Your doctor may also give you more specific instructions. Call your doctor if you have any problems or questions after your procedure.  HOME CARE  Only take medicine as told by your doctor. Take medicines exactly as told. Do not stop taking medicines or start any new medicines without talking to your doctor first.  Take your pulse as told by your doctor.  Do deep breathing as told by your doctor. Use your breathing device (incentive spirometer), if given, to practice deep breathing several times a day. Support your chest with a pillow or your arms when you take deep breaths or cough.  Keep the area clean, dry, and protected where the surgery cuts (incisions) were made. Remove bandages (dressings) only as told by your doctor. If strips were applied to surgical area, do not take them off. They fall off on their own.  Check the surgery area daily for puffiness (swelling), redness, or leaking fluid.  If surgery cuts were made in your legs:  Avoid crossing your legs.  Avoid sitting for long periods of time. Change positions every 30 minutes.  Raise your legs when you are sitting. Place them on pillows.  Wear stockings that help keep blood clots from forming in your legs (compression stockings).  Only take sponge baths until your doctor says it is okay to take showers. Pat the surgery area dry. Do not rub the surgery area with a washcloth or towel. Do not bathe, swim, or use a hot tub until your doctor says it is okay.  Eat foods that are high in  fiber. These include raw fruits and vegetables, whole grains, beans, and nuts. Choose lean meats. Avoid canned, processed, and fried foods.  Drink enough fluids to keep your pee (urine) clear or pale yellow.  Weigh yourself every day.  Rest and limit activity as told by your doctor. You may be told to:  Stop any activity if you have chest pain, shortness of breath, changes in heartbeat, or dizziness. Get help right away if this happens.  Move around often for short amounts of time or take short walks as told by your doctor. Gradually become more active. You may need help to strengthen your muscles and build endurance.  Avoid lifting, pushing, or pulling anything heavier than 10 pounds (4.5 kg) for at least 6 weeks after surgery.  Do not drive until your doctor says it is okay.  Ask your doctor when you can go back to work.  Ask your doctor when you can begin sexual activity again.  Follow up with your doctor as told. GET HELP IF:  You have puffiness, redness, more pain, or fluid draining from the incision site.  You have a fever.  You have puffiness in your ankles or legs.  You have pain in your legs.  You gain 2 or more pounds (0.9 kg) a day.  You feel sick to your stomach (nauseous) or throw up (vomit).  You have watery poop (diarrhea). GET HELP RIGHT AWAY IF:  You have chest pain that goes to your jaw or arms.  You have shortness of breath.  You have a fast or irregular heartbeat.  You notice a "clicking" in your breastbone when you move.  You have numbness or weakness in your arms or legs.  You feel dizzy or light-headed. MAKE SURE YOU:  Understand these instructions.  Will watch your condition.  Will get help right away if you are not doing well or get worse.   This information is not intended to replace advice given to you by your health care provider. Make sure you discuss any questions you have with your health care provider.   Document Released:  07/30/2013 Document Reviewed: 07/30/2013 Elsevier Interactive Patient Education [employer].

## 2015-08-21 NOTE — Progress Notes (Signed)
Physical Therapy Treatment Patient Details Name: [name] MRN: [id number] DOB: [birth year] Today's Date: 08/21/2015    History of Present Illness Pt is a 60 y.o. male s/p CABG 08-13-15. PMH consists of R midfoot amputation, large ventral hernia, DM, hypertension, hyperlipidemia, PAF, previous smoking, prior stroke and cerebrovascular disease and mental retardation.    PT Comments    Noticeably not feeling well today.  Looks listless, slow to move, not wanting to do much, push himself.  Still needs cuing to not push or pull with his hands.  He is able to transition from most surfaces without UE's due to his short stature.  Will continue progressing ambulation.   Follow Up Recommendations  Home health PT;Supervision/Assistance - 24 hour     Equipment Recommendations  None recommended by PT    Recommendations for Other Services       Precautions / Restrictions Precautions Precautions: Sternal;Fall Precaution Comments: Sternal precautions reinforced multiple times Restrictions Weight Bearing Restrictions: Yes    Mobility  Bed Mobility               General bed mobility comments: up in chair  Transfers Overall transfer level: Needs assistance Equipment used: Rolling walker (2 wheeled) Transfers: Sit to/from Stand Sit to Stand: Supervision         General transfer comment: cues for sternal precaution and reinforced hand placement  Ambulation/Gait Ambulation/Gait assistance: Supervision Ambulation Distance (Feet): 130 Feet Assistive device: Rolling walker (2 wheeled) Gait Pattern/deviations: Step-through pattern     General Gait Details: Generally steady, but "listless" today.. Sats on RA 90% and EHR upper 80's bpm   Stairs            Wheelchair Mobility    Modified Rankin (Stroke Patients Only)       Balance Overall balance assessment: Needs assistance   Sitting balance-Leahy Scale: Good     Standing balance support: No upper extremity  supported Standing balance-Leahy Scale: Fair                      Cognition Arousal/Alertness: Awake/alert Behavior During Therapy: WFL for tasks assessed/performed Overall Cognitive Status: History of cognitive impairments - at baseline                      Exercises      General Comments        Pertinent Vitals/Pain Pain Assessment: No/denies pain    Home Living                      Prior Function            PT Goals (current goals can now be found in the care plan section) Acute Rehab PT Goals Patient Stated Goal: not stated PT Goal Formulation: With patient Time For Goal Achievement: 08/30/15 Potential to Achieve Goals: Good Progress towards PT goals: Progressing toward goals    Frequency  Min 3X/week    PT Plan Current plan remains appropriate    Co-evaluation             End of Session   Activity Tolerance: Patient tolerated treatment well Patient left: in chair;with call bell/phone within reach     Time: [id number] PT Time Calculation (min) (ACUTE ONLY): 12 min  Charges:  $Gait Training: 8-22 mins                    G Codes:      Arsal Tappan,  Tessie Fass 08/21/2015, 10:07 AM   08/21/2015  Donnella Sham, [place] [phone]  (pager)

## 2015-08-21 NOTE — Care Management Note (Addendum)
Case Management Note Previous CM note initiated by Luz Lex RN, CM  Patient Details  Name: SPENCE GUSMANO MRN: [id number] Date of Birth: 12-24-1955  Subjective/Objective:    Lives at home with roommate.  Roommate works nights, no one else around.  Has sister, who he would like [place] to call.  Talked with sister, she states he has been at [facility] in past and would like him to return there post discharge.  SW referral made.                Action/Plan:   Expected Discharge Date:                  Expected Discharge Plan:  Skilled Nursing Facility  In-House Referral:  Clinical Social Work  Discharge planning Services  CM Consult  Post Acute Care Choice:    Choice offered to:     DME Arranged:    DME Agency:     HH Arranged:    [place] Agency:     Status of Service:  Completed, signed off  Medicare Important Message Given:  Yes Date Medicare IM Given:    Medicare IM give by:    Date Additional Medicare IM Given:    Additional Medicare Important Message give by:     If discussed at [place] of Stay Meetings, dates discussed:  08/20/15  Additional Comments: 08/21/2015 Pt will discharge to SNF today 08/21/15  08/20/15- Marvetta Gibbons RN, BSN - d/c planning for SNF- [facility] for [facility]  Maryclare Labrador, RN 08/21/2015, 8:49 AM

## 2015-08-31 ENCOUNTER — Encounter: Payer: Self-pay | Admitting: Vascular Surgery

## 2015-09-02 ENCOUNTER — Ambulatory Visit
Admission: RE | Admit: 2015-09-02 | Discharge: 2015-09-02 | Disposition: A | Discharge status: Home / Self Care | Payer: Medicare (Managed Care) | Source: Ambulatory Visit | Attending: Family Medicine | Admitting: Family Medicine

## 2015-09-02 ENCOUNTER — Other Ambulatory Visit: Payer: Self-pay | Admitting: Family Medicine

## 2015-09-02 DIAGNOSIS — R0989 Other specified symptoms and signs involving the circulatory and respiratory systems: Secondary | ICD-10-CM

## 2015-09-02 DIAGNOSIS — R05 Cough: Secondary | ICD-10-CM

## 2015-09-02 DIAGNOSIS — R059 Cough, unspecified: Secondary | ICD-10-CM

## 2015-09-03 ENCOUNTER — Encounter: Payer: Medicare (Managed Care) | Admitting: Vascular Surgery

## 2015-09-03 NOTE — Progress Notes (Signed)
Cardiology Office Note:    Date:  09/04/2015   ID:  [name], DOB April 14, 1956, MRN [id number]  PCP:  [name], MD  Cardiologist:  [name]   Electrophysiologist:  N/a VVS: Dr. Scot Dock TCTS:  Dr. Cyndia Bent   Chief Complaint  Patient presents with  . Hospitalization Follow-up    s/p CABG + CEA  . Coronary Artery Disease    History of Present Illness:    [name] is a 60 y.o. male with a hx of PAF, HTN, diabetes, HL, prior stroke, carotid stenosis, COPD, intellectual disability, diabetic ulcer s/p R midfoot amputation 2014.   Admitted 10/2014 with NSTEMI in the setting of sepsis syndrome c/b acute hypoxic respiratory failure secondary to a/c diastolic CHF, AECOPD and AF with RVR.  Given his multiple comorbid conditions, it was decided to treat him conservatively for his non-STEMI.  In 8/16, he was noted to have progressive carotid artery stenosis on the left.  L CEA was planned but pre-operative Myoview was high risk and demonstrated inferior, apical, inferolateral ischemia.  [place] 04/30/15 demonstrated 3v CAD and CABG was recommended.He was evaluated by Dr. Cyndia Bent and surgery was planned (CABG + CEA) on 10/11.However, when he presented his A1c was noted to be 10.7 and his CBG was 501. He was felt to be high risk for perioperative complications and surgery was postponed.   His treatment for diabetes was intensified and his Hgb A1c ultimately improved to 5.6 and surgery was planned.  He was admitted 1/5-1/13 and underwent CABG with L-LAD, S-RI, S-OM2 + L CEA.  He had several episodes of atrial fibrillation postoperatively. He was placed on amiodarone. However, his heart rates dropped into the 50s and amiodarone was stopped. Pradaxa was resumed.  He was placed on digoxin.  Postoperative course was otherwise fairly uneventful.  He was DC to SNF.    Returns for FU.  I received a call from his PCP at [facility] (Dr. Bradd Burner).  When seen 2 days ago, he was volume  overloaded and Lasix was started.  He was also noted to have LLL pneumonia and Levaquin was started.  Labs came back with Hgb 8.8 (8.7 at DC on 1/13), K+ 6.3, Creatinine 0.75, Dig 0.9.  Dr. Bradd Burner plans to give him Kayexalate.  [name] returns today for FU.  He is at [facility].  He is here alone.  SNF did not send his med list.  He has a productive cough.  Denies fever.  He notes some increased SOB. Denies orthopnea, PND.  LE edema is stable.  He is eating well.  Denies any bleeding.    Past Medical History  Diagnosis Date  . Hypertension   . Mental retardation     Sister helps to take care of him and takes him to appts  . Tobacco user     Smokes 1ppd for multiple years.  Quit after hosp 09/2010.  Marland Kitchen Anemia     History of Iron Def Anemia  . Anxiety   . Hyperlipidemia   . PAF (paroxysmal atrial fibrillation) ([place]) 06/2009    CHADS score 2 (HTN, DM), was not on coumadin, but now on Pradaxa for Afib  . Lung nodule   . Chronic low back pain   . CVA (cerebral infarction) 09/2010    Bilateral with Left > Right  . Abdominal hernia     Chronic, not a good surgical candidate  . Carotid artery occlusion   . Obesity   . COPD (chronic  obstructive pulmonary disease) ([place])   . Abscess, abdomen ([place]) 12/31/2010    Referred to Wound Care in 01/2011 because of multiple abd abscess with VERY large ventral hernia (please look at image of CT abd/pelvis 09/2010).  Because of hernia I was hesitant to I&D.      Marland Kitchen GERD (gastroesophageal reflux disease)   . Pneumonia     hx of  . Chronic diastolic CHF (congestive heart failure) (H[facility])     takes Lasix  . Itching     all over body; pt scratches and has sores on bilateral arms and abdomen  . Hx of echocardiogram     Echo 5/16:  Mild LVH, EF 55%, indeterm. diast function, WMA could not be ruled out, MAC, trivial MR, mild LAE, normal RVF  . Myocardial infarction [facility]) 2016 ?    Heart attack  (  Per  pt. )  . History of nuclear stress test     Myoview 9/16:   Inferior, apical and inf-lateral ischemia; not gated; High Risk  . Shortness of breath dyspnea     with exertion  . Diabetes mellitus   . Stroke ([place])   . Oxygen dependent     wears 2 liters at bedtime and when needed  . Headache   . Wears glasses     Past Surgical History  Procedure Laterality Date  . Carotid arteriogram  10/2010    30% right ICA stenosis, 40% left ICA stenosis   . Transesophageal echocardiogram  09/2010    No ASD or PFO. EF 60-65%.  Normal systolic function. No evidence of thrombus.   . Transthoracic echocardiogram  09/2010     The cavity size was normal. Systolic function was vigorous.  EF 65-70%.  Normal wall funciton.   . Debridment of decubitus ulcer Right 02/13/2013  . I&d extremity Right 02/13/2013    Procedure: IRRIGATION AND DEBRIDEMENT FOOT ULCER;  Surgeon: Johnny Bridge, MD;  Location: [place];  Service: Orthopedics;  Laterality: Right;  PULSE LAVAGE  . Cataract extraction, bilateral    . Arm surgery Left     as a child  . Amputation Right 03/29/2013    Procedure: AMPUTATION RAY;  Surgeon: Newt Minion, MD;  Location: [place];  Service: Orthopedics;  Laterality: Right;  Right Foot 3rd and Possible 4th Ray Amputation  . Multiple tooth extractions    . Amputation Right 04/23/2013    Procedure: AMPUTATION RIGHT MID-FOOT;  Surgeon: Newt Minion, MD;  Location: [place];  Service: Orthopedics;  Laterality: Right;  . Esophagogastroduodenoscopy (egd) with propofol N/A 06/10/2014    Procedure: ESOPHAGOGASTRODUODENOSCOPY (EGD) WITH PROPOFOL;  Surgeon: Jerene Bears, MD;  [facility];  Service: Gastroenterology;  Laterality: N/A;  . Colonoscopy with propofol N/A 06/10/2014    Procedure: COLONOSCOPY WITH PROPOFOL;  Surgeon: Jerene Bears, MD;  Location: [facility];  Service: Gastroenterology;  Laterality: N/A;  . Givens capsule study N/A 07/09/2014    Procedure: GIVENS CAPSULE STUDY;  Surgeon: Jerene Bears, MD;  Location: [facility];  Service: Gastroenterology;   Laterality: N/A;  . Cardiac catheterization N/A 04/30/2015    Procedure: Left Heart Cath and Coronary Angiography;  Surgeon: Belva Crome, MD;  Location: [facility];  Service: Cardiovascular;  Laterality: N/A;  . Coronary artery bypass graft N/A 08/13/2015    Procedure: CORONARY ARTERY BYPASS GRAFTING (CABG), ON PUMP, TIMES THREE, USING LEFT INTERNAL MAMMARY ARTERY, RIGHT GREATER SAPHENOUS VEIN HARVESTED ENDOSCOPICALLY;  Surgeon: [name], MD;  Location: [facility];  Service: Open Heart Surgery;  Laterality: N/A;  . Tee without cardioversion N/A 08/13/2015    Procedure: TRANSESOPHAGEAL ECHOCARDIOGRAM (TEE);  Surgeon: [name], MD;  Location: [place];  Service: Open Heart Surgery;  Laterality: N/A;  . Endarterectomy Left 08/13/2015    Procedure: ENDARTERECTOMY CAROTID;  Surgeon: Angelia Mould, MD;  Location[facility];  Service: Vascular;  Laterality: Left;    Current Medications: Outpatient Prescriptions Prior to Visit  Medication Sig Dispense Refill  . acetaminophen (TYLENOL) 325 MG tablet Take 2 tablets (650 mg total) by mouth every 6 (six) hours as needed for mild pain.    Marland Kitchen aspirin EC 81 MG EC tablet Take 1 tablet (81 mg total) by mouth daily. 90 tablet 3  . atorvastatin (LIPITOR) 80 MG tablet Take 1 tablet (80 mg total) by mouth daily at 6 PM. 45 tablet 0  . Cholecalciferol (VITAMIN D3) 1000 UNITS CAPS Take 1,000 Units by mouth daily.    . clonazePAM (KLONOPIN) 0.5 MG tablet Take 1 tablet (0.5 mg total) by mouth at bedtime. 30 tablet 0  . dabigatran (PRADAXA) 150 MG CAPS capsule Take 1 capsule (150 mg total) by mouth every 12 (twelve) hours. 60 capsule 11  . digoxin (LANOXIN) 0.125 MG tablet Take 1 tablet (0.125 mg total) by mouth daily.    . ferrous gluconate (FERGON) 324 MG tablet Take 1 tablet (324 mg total) by mouth daily with breakfast. 60 tablet 0  . gabapentin (NEURONTIN) 300 MG capsule Take 1 capsule (300 mg total) by mouth 2 (two) times daily. 60 capsule 0  .  HYDROcodone-acetaminophen (NORCO/VICODIN) 5-325 MG tablet Take 1 tablet by mouth every 6 (six) hours as needed for moderate pain. 30 tablet 0  . hydrocortisone 2.5 % ointment Apply topically 3 (three) times daily. To left leg around wound. Do not apply to broken skin. 30 g 0  . insulin aspart (NOVOLOG) 100 UNIT/ML injection Inject 0-20 Units into the skin 3 (three) times daily with meals. Units of Insulin CBG 70-120=0       CBG 251-300=11 CBG 121-150=3     CBG 301-350=15 CBG 151-200=4     CBG 351-400=20 CBG 201-250=7     CBG>400, call MD 10 mL 11  . insulin glargine (LANTUS) 100 UNIT/ML injection Inject 0.2-0.6 mLs (20-60 Units total) into the skin 2 (two) times daily. 20 units in the morning, 60 units in the evening 10 mL 11  . metFORMIN (GLUCOPHAGE) 1000 MG tablet Take 1,000 mg by mouth 2 (two) times daily with a meal.    . metoprolol tartrate (LOPRESSOR) 25 MG tablet Take 1 tablet (25 mg total) by mouth 3 (three) times daily.    Marland Kitchen omeprazole (PRILOSEC) 20 MG capsule Take 20 mg by mouth daily.    Marland Kitchen PARoxetine (PAXIL) 40 MG tablet Take 40 mg by mouth every morning.    . saccharomyces boulardii (FLORASTOR) 250 MG capsule Take 1 capsule (250 mg total) by mouth 2 (two) times daily. 60 capsule 2   No facility-administered medications prior to visit.     Allergies:   Penicillins   Social History   Social History  . Marital Status: Single    Spouse Name: N/A  . Number of Children: 0  . Years of Education: N/A   Occupational History  .     Social History Main Topics  . Smoking status: Former Smoker -- 1.00 packs/day for 42 years    Types: Cigarettes, Pipe    Quit  date: 11/10/2010  . Smokeless tobacco: Former Systems developer    Quit date: 10/02/2010  . Alcohol Use: No     Comment: " last drink was 2003" 08/11/15  . Drug Use: No  . Sexual Activity: Not Asked   Other Topics Concern  . None   Social History Narrative   Released from prison in 2003 after serving 2 years for indecency with a  minor.    He smokes cigarettes greater than 30 years, also smoked a pipe.  Quit tobacco 09/2010.   Denies EtOH and drugs.      Family History:  The patient's family history includes Breast cancer in his maternal grandmother; Heart attack in his maternal grandmother; Heart disease in his brother, father, maternal grandmother, and mother; Hypertension in his sister; Peripheral vascular disease in his father; Stomach cancer in his maternal uncle. There is no history of Colon cancer or Stroke.   ROS:   Please see the history of present illness.    ROS All other systems reviewed and are negative.   Physical Exam:    VS:  BP 120/42 mmHg  Pulse 60  Ht 5\' 5"  (1.651 m)  Wt 200 lb 6.4 oz (90.901 kg)  BMI 33.35 kg/m2   GEN: Well nourished, well developed, in no acute distress HEENT: normal Neck: no JVD, no masses Cardiac: Normal S1/S2, RRR; no murmurs, rubs, or gallops, trace-1+ bilat LE edema; L CEA scar well healed  Chest Median sternotomy well healed, no erythema or d/c  Respiratory: decreased breath sounds at the bases bilaterally with scant crackles L base, no wheezing GI: soft, nontender  MS: no deformity or atrophy Skin: warm and dry, no rash Neuro:   no focal deficits  Psych: Alert and oriented x 3, normal affect  Wt Readings from Last 3 Encounters:  09/04/15 200 lb 6.4 oz (90.901 kg)  08/21/15 195 lb 4.8 oz (88.587 kg)  08/11/15 203 lb 0.7 oz (92.1 kg)     Studies/Labs Reviewed:    EKG:  EKG is  ordered today.  The ekg ordered today demonstrates NSR, HR 61, RBBB, low voltage, no significant changes.   Recent Labs: 12/15/2014: Pro B Natriuretic peptide (BNP) 290.0* 05/19/2015: B Natriuretic Peptide 50.5 08/11/2015: ALT 11* 08/14/2015: Magnesium 2.2 08/21/2015: BUN 32*; Creatinine, Ser 1.21; Hemoglobin 8.7*; Platelets 341; Sodium 137 09/04/2015: Potassium 6.2*   Recent Lipid Panel    Component Value Date/Time   CHOL 99* 07/14/2015 1149   TRIG 156* 07/14/2015 1149   HDL 21*  07/14/2015 1149   CHOLHDL 4.7 07/14/2015 1149   VLDL 31* 07/14/2015 1149   LDLCALC 47 07/14/2015 1149   LDLDIRECT 54 08/23/2012 1447    Additional studies/ records that were reviewed today include:   CXR 09/02/15 IMPRESSION: LEFT lower lobe and lingular PNEUMONIA.  LHC 04/30/15 LAD: Ostial and mid 95%, distal 40%, ostial D1 100%, ostial D2 85% RI: Ostial 65% LCx: Proximal to mid 70%, mid 65%, OM2 90% RCA: Ostial 80%, proximal to distal 100%, distal 100%, RPDA 85%, RPAVB 100% EF 35-45%  Myoview 04/23/15 Large, mostly reversible inferior, apical and inferolateral reversible perfusion defect suggestive of ischemia (SDS 10, Extent 37%). This study was not-gated due to atrial fibrillation. This is a high-risk study based on the size and severity of the perfusion defect.  Carotid US 8/16 R 60-79% L 80-99%  Echo 12/18/14 Mild LVH, EF 55%, wall motion abnormalities could not be excluded, MAC, trivial MR, mild LAE  Echo 10/16/12 Mild LVH, vigorous LVF, EF 65-70%,  normal wall motion, grade 1 diastolic dysfunction  Nuclear study 09/06/12 Myoview scan with small apical defect Seen in horizontal images only. Cannot exclude tiny region of apical ischemia vs shift soft tissue. Otherwise normal perfusion. Overall low risk scan. LV Ejection Fraction: 69%. LV Wall Motion: NL LV Function; NL Wall Motion    ASSESSMENT:    1. Coronary artery disease involving native coronary artery of native heart without angina pectoris   2. Chronic combined systolic and diastolic CHF (congestive heart failure) ([place])   3. Ischemic cardiomyopathy   4. PAF (paroxysmal atrial fibrillation) ([place])   5. Essential hypertension   6. Bilateral carotid artery occlusion   7. HCAP (healthcare-associated pneumonia)   8. Hyperkalemia     PLAN:    In order of problems listed above:  1. CAD: History of non-STEMI in 10/2014 while hospitalized for acute, multiple, medical illnesses. Now s/p CABG + L CEA.  Continue  ASA, Lipitor, beta-blocker.  FU with Dr. Cyndia Bent as planned.   2. Chronic combined systolic and diastolic CHF (congestive heart failure): Somewhat volume overloaded.  Lasix was to be started. However, it is not on his list at SNF.  Will start Lasix 20 mg QD with repeat BMET in 1 week.    3. Ischemic Cardiomyopathy: EF 55% by echo in 5/16. Recent nuclear study and LHC with EF 35-45%.Continue beta-blocker.  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  ACE should be avoided.  Consider repeat Echo 90 days post CABG.  4. Paroxysmal atrial fibrillation: Maintaining NSR. He remains on Pradaxa for anticoagulation. He has a hx of CVA. Post op anemia overall stable.  He remains on Iron.   5. Essential hypertension: Controlled.  6. Carotid stenosis, bilateral: s/p L CEA.  FU with Dr. Scot Dock as planned.   7. Pneumonia - Dr. Bradd Burner has recently placed him on Levaquin.     8. Hyperkalemia - Dr. Bradd Burner to give Kayexalate.  No peaked T waves on ECG. Will get repeat stat K+ while he is here >> 6.2.  I spoke with Dr Bradd Burner who will give the patient Kayexalate.      Medication Adjustments/Labs and Tests Ordered: Current medicines are reviewed at length with the patient today.  Concerns regarding medicines are outlined above.  Medication changes, Labs and Tests ordered today are outlined in the Patient Instructions noted below. Patient Instructions  Medication Instructions:  Your physician has recommended you make the following change in your medication:  1. Start Lasix ( 20 mg ) daily  Labwork: Your physician recommends that you have STAT  lab work TODAY: POTASSIUM, results will be called to Dr. [name] at [facility] office @ [phone] In 1 week: BMET (to be drawn at [facility])  Testing/Procedures: -None  Follow-Up: Richardson Dopp, PA-C in 2 weeks.   Any Other Special Instructions Will Be Listed Below (If Applicable).  If you need a refill on your cardiac medications before your next  appointment, please call your pharmacy.   Signed, Richardson Dopp, PA-C  09/04/2015 12:53 PM    [facility] [address], [place], [place]  [address] Phone: [phone]; Fax: [phone]

## 2015-09-04 ENCOUNTER — Ambulatory Visit (INDEPENDENT_AMBULATORY_CARE_PROVIDER_SITE_OTHER): Payer: Medicare (Managed Care) | Admitting: Physician Assistant

## 2015-09-04 ENCOUNTER — Encounter: Payer: Self-pay | Admitting: Physician Assistant

## 2015-09-04 VITALS — BP 120/42 | HR 60 | Ht 65.0 in | Wt 200.4 lb

## 2015-09-04 DIAGNOSIS — I255 Ischemic cardiomyopathy: Secondary | ICD-10-CM

## 2015-09-04 DIAGNOSIS — I480 Paroxysmal atrial fibrillation: Secondary | ICD-10-CM | POA: Diagnosis not present

## 2015-09-04 DIAGNOSIS — I5042 Chronic combined systolic (congestive) and diastolic (congestive) heart failure: Secondary | ICD-10-CM | POA: Diagnosis not present

## 2015-09-04 DIAGNOSIS — E7800 Pure hypercholesterolemia, unspecified: Secondary | ICD-10-CM

## 2015-09-04 DIAGNOSIS — I2510 Atherosclerotic heart disease of native coronary artery without angina pectoris: Secondary | ICD-10-CM | POA: Diagnosis not present

## 2015-09-04 DIAGNOSIS — N183 Chronic kidney disease, stage 3 (moderate): Secondary | ICD-10-CM

## 2015-09-04 DIAGNOSIS — I6523 Occlusion and stenosis of bilateral carotid arteries: Secondary | ICD-10-CM

## 2015-09-04 DIAGNOSIS — J189 Pneumonia, unspecified organism: Secondary | ICD-10-CM

## 2015-09-04 DIAGNOSIS — I10 Essential (primary) hypertension: Secondary | ICD-10-CM

## 2015-09-04 DIAGNOSIS — E875 Hyperkalemia: Secondary | ICD-10-CM

## 2015-09-04 LAB — POTASSIUM: POTASSIUM: 6.2 mmol/L — AB (ref 3.5–5.1)

## 2015-09-04 MED ORDER — FUROSEMIDE 20 MG PO TABS: 20.0000 mg | ORAL_TABLET | Freq: Every day | ORAL | Status: DC

## 2015-09-04 MED ORDER — LEVOFLOXACIN 500 MG PO TABS: 500.0000 mg | ORAL_TABLET | Freq: Every day | ORAL | Status: DC

## 2015-09-04 MED ORDER — MUPIROCIN CALCIUM 2 % EX CREA: 1.0000 "application " | TOPICAL_CREAM | Freq: Two times a day (BID) | CUTANEOUS | Status: DC

## 2015-09-04 NOTE — Patient Instructions (Addendum)
Medication Instructions:  Your physician has recommended you make the following change in your medication:  1. Start Lasix ( 20 mg ) daily  Labwork: Your physician recommends that you have STAT  lab work TODAY: POTASSIUM, results will be called to Dr. [name] at [facility] office @ [phone] In 1 week: BMET (to be drawn at [facility])  Testing/Procedures: -None  Follow-Up: Richardson Dopp, PA-C in 2 weeks.   Any Other Special Instructions Will Be Listed Below (If Applicable).  If you need a refill on your cardiac medications before your next appointment, please call your pharmacy.

## 2015-09-09 ENCOUNTER — Encounter: Payer: Self-pay | Admitting: Vascular Surgery

## 2015-09-09 ENCOUNTER — Ambulatory Visit (INDEPENDENT_AMBULATORY_CARE_PROVIDER_SITE_OTHER): Payer: Self-pay | Admitting: Vascular Surgery

## 2015-09-09 VITALS — BP 105/59 | HR 63 | Temp 96.9°F | Resp 16 | Ht 60.0 in | Wt 212.0 lb

## 2015-09-09 DIAGNOSIS — Z48812 Encounter for surgical aftercare following surgery on the circulatory system: Secondary | ICD-10-CM

## 2015-09-09 NOTE — Addendum Note (Signed)
Addended by: Reola Calkins on: 09/09/2015 01:34 PM   Modules accepted: Orders

## 2015-09-09 NOTE — Progress Notes (Signed)
Patient name: ANKIT KOPECKY MRN: [id number] DOB: [birth year] Sex: male  REASON FOR VISIT: Follow up after left carotid endarterectomy/CABG.  VQI PATIENT  HPI: PHUC VALLEY is a 60 y.o. male who had presented for coronary artery bypass grafting and was found to have a critical left carotid stenosis. Combined left carotid endarterectomy and CABG was recommended. Of note, on the right side there was only a 40-59% stenosis.  The patient underwent combined left carotid endarterectomy and CABG on 08/14/2015. He did well postoperatively and returns for his first outpatient visit. He denies any focal weakness or paresthesias. He has had no problems swallowing and no hoarseness.  He is on aspirin and is on a statin.  Current Outpatient Prescriptions  Medication Sig Dispense Refill  . acetaminophen (TYLENOL) 325 MG tablet Take 2 tablets (650 mg total) by mouth every 6 (six) hours as needed for mild pain.    Marland Kitchen aspirin EC 81 MG EC tablet Take 1 tablet (81 mg total) by mouth daily. 90 tablet 3  . atorvastatin (LIPITOR) 80 MG tablet Take 1 tablet (80 mg total) by mouth daily at 6 PM. 45 tablet 0  . Cholecalciferol (VITAMIN D3) 1000 UNITS CAPS Take 1,000 Units by mouth daily.    . clonazePAM (KLONOPIN) 0.5 MG tablet Take 1 tablet (0.5 mg total) by mouth at bedtime. 30 tablet 0  . dabigatran (PRADAXA) 150 MG CAPS capsule Take 1 capsule (150 mg total) by mouth every 12 (twelve) hours. 60 capsule 11  . digoxin (LANOXIN) 0.125 MG tablet Take 1 tablet (0.125 mg total) by mouth daily.    . ferrous gluconate (FERGON) 324 MG tablet Take 1 tablet (324 mg total) by mouth daily with breakfast. 60 tablet 0  . furosemide (LASIX) 20 MG tablet Take 1 tablet (20 mg total) by mouth daily. 30 tablet 3  . gabapentin (NEURONTIN) 300 MG capsule Take 1 capsule (300 mg total) by mouth 2 (two) times daily. 60 capsule 0  . HYDROcodone-acetaminophen (NORCO/VICODIN) 5-325 MG tablet Take 1 tablet by mouth every 6 (six) hours  as needed for moderate pain. 30 tablet 0  . hydrocortisone 2.5 % ointment Apply topically 3 (three) times daily. To left leg around wound. Do not apply to broken skin. 30 g 0  . insulin aspart (NOVOLOG) 100 UNIT/ML injection Inject 0-20 Units into the skin 3 (three) times daily with meals. Units of Insulin CBG 70-120=0       CBG 251-300=11 CBG 121-150=3     CBG 301-350=15 CBG 151-200=4     CBG 351-400=20 CBG 201-250=7     CBG>400, call MD 10 mL 11  . insulin glargine (LANTUS) 100 UNIT/ML injection Inject 0.2-0.6 mLs (20-60 Units total) into the skin 2 (two) times daily. 20 units in the morning, 60 units in the evening 10 mL 11  . levofloxacin (LEVAQUIN) 500 MG tablet Take 1 tablet (500 mg total) by mouth daily. 30 tablet 0  . metFORMIN (GLUCOPHAGE) 1000 MG tablet Take 1,000 mg by mouth 2 (two) times daily with a meal.    . metoprolol tartrate (LOPRESSOR) 25 MG tablet Take 1 tablet (25 mg total) by mouth 3 (three) times daily.    . mupirocin cream (BACTROBAN) 2 % Apply 1 application topically 2 (two) times daily. FOR SKIN TEARS 15 g 0  . omeprazole (PRILOSEC) 20 MG capsule Take 20 mg by mouth daily.    Marland Kitchen PARoxetine (PAXIL) 40 MG tablet Take 40 mg by mouth every morning.    Marland Kitchen  saccharomyces boulardii (FLORASTOR) 250 MG capsule Take 1 capsule (250 mg total) by mouth 2 (two) times daily. 60 capsule 2   No current facility-administered medications for this visit.    REVIEW OF SYSTEMS:  [X]  denotes positive finding, [ ]  denotes negative finding Cardiac  Comments:  Chest pain or chest pressure:    Shortness of breath upon exertion:    Short of breath when lying flat:    Irregular heart rhythm:    Constitutional    Fever or chills:      PHYSICAL EXAM: Filed Vitals:   09/09/15 1025 09/09/15 1027  BP: 107/66 105/59  Pulse: 62 63  Temp:  96.9 F (36.1 C)  TempSrc:  Oral  Resp:  16  Height:  5' (1.524 m)  Weight:  212 lb (96.163 kg)  SpO2:  95%    GENERAL: The patient is a  well-nourished male, in no acute distress. The vital signs are documented above. CARDIOVASCULAR: There is a regular rate and rhythm. PULMONARY: There is good air exchange bilaterally without wheezing or rales. His left neck incision is healing nicely. NEURO: He has no focal weakness or paresthesias.  MEDICAL ISSUES:  The patient is doing well status post left carotid endarterectomy with Dacron patch angioplasty. He has a 40-59% right carotid stenosis and I have ordered a follow up duplex scan in 6 months. He understands we would not consider right carotid endarterectomy unless the stenosis progressed to greater than 80%. He is on aspirin. He is on statin. He is not a smoker.  [name] [facility]: [phone]

## 2015-09-15 ENCOUNTER — Other Ambulatory Visit: Payer: Self-pay | Admitting: Surgery

## 2015-09-15 DIAGNOSIS — Z951 Presence of aortocoronary bypass graft: Secondary | ICD-10-CM

## 2015-09-16 ENCOUNTER — Encounter: Payer: Self-pay | Admitting: Surgery

## 2015-09-16 ENCOUNTER — Ambulatory Visit
Admission: RE | Admit: 2015-09-16 | Discharge: 2015-09-16 | Disposition: A | Discharge status: Home / Self Care | Payer: Medicare (Managed Care) | Source: Ambulatory Visit | Attending: Surgery | Admitting: Surgery

## 2015-09-16 ENCOUNTER — Ambulatory Visit (INDEPENDENT_AMBULATORY_CARE_PROVIDER_SITE_OTHER): Payer: Self-pay | Admitting: Surgery

## 2015-09-16 VITALS — BP 114/58 | HR 66 | Resp 16 | Ht 60.0 in | Wt 212.0 lb

## 2015-09-16 DIAGNOSIS — I779 Disorder of arteries and arterioles, unspecified: Secondary | ICD-10-CM

## 2015-09-16 DIAGNOSIS — Z951 Presence of aortocoronary bypass graft: Secondary | ICD-10-CM

## 2015-09-16 DIAGNOSIS — I2510 Atherosclerotic heart disease of native coronary artery without angina pectoris: Secondary | ICD-10-CM

## 2015-09-16 DIAGNOSIS — I739 Peripheral vascular disease, unspecified: Secondary | ICD-10-CM

## 2015-09-16 DIAGNOSIS — Z9889 Other specified postprocedural states: Secondary | ICD-10-CM

## 2015-09-16 NOTE — Progress Notes (Signed)
HPI: Patient returns for routine postoperative follow-up having undergone CABG x 3 by me and left carotid endarterectomy by Dr. Scot Dock on 08/13/2015. The patient's early postoperative recovery while in the hospital was notable for an uncomplicated postop course. Since hospital discharge the patient reports that he has been feeling well. He denies any shortness of breath or chest pain. He has had mild ankle edema that is almost resolved.   Current Outpatient Prescriptions  Medication Sig Dispense Refill  . acetaminophen (TYLENOL) 325 MG tablet Take 2 tablets (650 mg total) by mouth every 6 (six) hours as needed for mild pain.    Marland Kitchen aspirin EC 81 MG EC tablet Take 1 tablet (81 mg total) by mouth daily. 90 tablet 3  . atorvastatin (LIPITOR) 80 MG tablet Take 1 tablet (80 mg total) by mouth daily at 6 PM. 45 tablet 0  . Cholecalciferol (VITAMIN D3) 1000 UNITS CAPS Take 1,000 Units by mouth daily.    . clonazePAM (KLONOPIN) 0.5 MG tablet Take 1 tablet (0.5 mg total) by mouth at bedtime. 30 tablet 0  . dabigatran (PRADAXA) 150 MG CAPS capsule Take 1 capsule (150 mg total) by mouth every 12 (twelve) hours. 60 capsule 11  . digoxin (LANOXIN) 0.125 MG tablet Take 1 tablet (0.125 mg total) by mouth daily.    . ferrous gluconate (FERGON) 324 MG tablet Take 1 tablet (324 mg total) by mouth daily with breakfast. 60 tablet 0  . furosemide (LASIX) 20 MG tablet Take 1 tablet (20 mg total) by mouth daily. 30 tablet 3  . gabapentin (NEURONTIN) 300 MG capsule Take 1 capsule (300 mg total) by mouth 2 (two) times daily. 60 capsule 0  . HYDROcodone-acetaminophen (NORCO/VICODIN) 5-325 MG tablet Take 1 tablet by mouth every 6 (six) hours as needed for moderate pain. 30 tablet 0  . hydrocortisone 2.5 % ointment Apply topically 3 (three) times daily. To left leg around wound. Do not apply to broken skin. 30 g 0  . insulin aspart (NOVOLOG) 100 UNIT/ML injection Inject 0-20 Units into the skin 3 (three) times daily  with meals. Units of Insulin CBG 70-120=0       CBG 251-300=11 CBG 121-150=3     CBG 301-350=15 CBG 151-200=4     CBG 351-400=20 CBG 201-250=7     CBG>400, call MD 10 mL 11  . insulin glargine (LANTUS) 100 UNIT/ML injection Inject 0.2-0.6 mLs (20-60 Units total) into the skin 2 (two) times daily. 20 units in the morning, 60 units in the evening 10 mL 11  . levofloxacin (LEVAQUIN) 500 MG tablet Take 1 tablet (500 mg total) by mouth daily. 30 tablet 0  . metFORMIN (GLUCOPHAGE) 1000 MG tablet Take 1,000 mg by mouth 2 (two) times daily with a meal.    . metoprolol tartrate (LOPRESSOR) 25 MG tablet Take 1 tablet (25 mg total) by mouth 3 (three) times daily.    . mupirocin cream (BACTROBAN) 2 % Apply 1 application topically 2 (two) times daily. FOR SKIN TEARS 15 g 0  . omeprazole (PRILOSEC) 20 MG capsule Take 20 mg by mouth daily.    Marland Kitchen PARoxetine (PAXIL) 40 MG tablet Take 40 mg by mouth every morning.    . saccharomyces boulardii (FLORASTOR) 250 MG capsule Take 1 capsule (250 mg total) by mouth 2 (two) times daily. 60 capsule 2   No current facility-administered medications for this visit.    Physical Exam: BP 114/58 mmHg  Pulse 66  Resp 16  Ht  5' (1.524 m)  Wt 212 lb (96.163 kg)  BMI 41.40 kg/m2  SpO2 90% He looks well. Lung exam is clear. Cardiac exam shows a regular rate and rhythm with normal heart sounds. Chest incision is healing well and sternum is stable. The leg incisions are healing well and there is minimal peripheral edema in the ankles.    Diagnostic Tests:  CLINICAL DATA: Postop CABG 5 weeks ago. Patient reports some incisional soreness, but no other chest complaints.  EXAM: CHEST 2 VIEW  COMPARISON: Radiographs 09/02/2015 and 08/17/2015.  FINDINGS: The heart size and mediastinal contours are stable status post median sternotomy and CABG. There is interval improved aeration of both lung bases with mild residual patchy atelectasis. There is no edema,  confluent airspace opacity or significant pleural effusion. The median sternotomy is intact.  IMPRESSION: Interval improved aeration of the lung bases with mild residual atelectasis. No focal airspace disease or edema.   Electronically Signed  By: Richardean Sale M.D.  On: 09/16/2015 08:45   Impression:  Overall I think he is doing well. I encouraged him to continue walking. I asked him not to lift anything heavier than 10 lbs for three months postop. I stressed the importance of keeping his diabetes under good control.  Plan:  He will continue follow up with Dr. Bradd Burner and Dr. Tamala Julian and will return to see me if there are any problems with his incisions.   [name], MD [facility] [phone]

## 2015-09-16 NOTE — Progress Notes (Signed)
Cardiology Office Note:    Date:  09/17/2015   ID:  [name], DOB 26-Jul-1956, MRN [id number]  PCP:  [name], MD  Cardiologist:  [name]   Electrophysiologist:  N/a VVS: Dr. Scot Dock TCTS:  Dr. Cyndia Bent   Chief Complaint  Patient presents with  . Congestive Heart Failure    Follow-up    History of Present Illness:    [name] is a 60 y.o. male with a hx of CAD, combined systolic and diastolic CHF, ischemic CM, PAF, HTN, diabetes, HL, prior stroke, carotid stenosis, COPD, intellectual disability, diabetic ulcer s/p R midfoot amputation 2014.   Admitted 3/16 with NSTEMI in the setting of sepsis syndrome c/b acute hypoxic respiratory failure secondary to a/c diastolic CHF, AECOPD and AF with RVR.  Given his multiple comorbid conditions, it was decided to treat him conservatively for his non-STEMI.  In 8/16, he was noted to have progressive carotid artery stenosis on the left. Risk stratification for L CEA was done with nuclear stress test that was high risk.  [place] 9/16 demonstrated 3v CAD and CABG + CEA was recommended.But, his A1c was 10.7. He was felt to be high risk for perioperative complications and surgery was postponed. After intensification of his diabetic regimen, his Hgb A1c improved to 5.6.  He underwent CABG with L-LAD, S-RI, S-OM2 + L CEA on 08/13/15.  He had several episodes of AF postoperatively. He was placed on amiodarone. But, the amiodarone was stopped 2/2 to bradycardia.  He was DC to SNF.    After DC, he was seen by Dr. Bradd Burner and noted to be volume overloaded.  CXR also confirmed pneumonia and he was placed on antibiotics.  He has a hx of RTA Type 4 and his K+ was 6.3.  Dr. Bradd Burner arranged Kayexalate.  Other labs:  Hgb 8.8 (8.7 at DC on 1/13), Creatinine 0.75, Dig 0.9.  Lasix was started.  I saw him 1/27.  No further changes were made.  Given his volume excess and pneumonia, I brought him back for close FU.    He saw Dr. Cyndia Bent  yesterday. Follow-up chest x-ray was improved. He returns for follow-up.    Past Medical History  Diagnosis Date  . Hypertension   . Intellectual disability     Sister helps to take care of him and takes him to appts  . Tobacco user     Smokes 1ppd for multiple years.  Quit after hosp 09/2010.  Marland Kitchen Anemia     History of Iron Def Anemia  . Anxiety   . Hyperlipidemia   . PAF (paroxysmal atrial fibrillation) ([place]) 06/2009    CHADS score 2 (HTN, DM), was not on coumadin, but now on Pradaxa for Afib  . Lung nodule   . Chronic low back pain   . CVA (cerebral infarction) 09/2010    Bilateral with Left > Right  . Abdominal hernia     Chronic, not a good surgical candidate  . Carotid artery occlusion   . Obesity   . COPD (chronic obstructive pulmonary disease) ([place])   . Abscess, abdomen ([place]) 12/31/2010    Referred to Wound Care in 01/2011 because of multiple abd abscess with VERY large ventral hernia (please look at image of CT abd/pelvis 09/2010).  Because of hernia I was hesitant to I&D.      Marland Kitchen GERD (gastroesophageal reflux disease)   . Pneumonia     hx of  . Chronic diastolic CHF (congestive heart failure) (  [place])     takes Lasix  . Itching     all over body; pt scratches and has sores on bilateral arms and abdomen  . Hx of echocardiogram     Echo 5/16:  Mild LVH, EF 55%, indeterm. diast function, WMA could not be ruled out, MAC, trivial MR, mild LAE, normal RVF  . Myocardial infarction [facility]) 2016 ?    Heart attack  (  Per  pt. )  . History of nuclear stress test     Myoview 9/16:  Inferior, apical and inf-lateral ischemia; not gated; High Risk  . Shortness of breath dyspnea     with exertion  . Diabetes mellitus   . Stroke ([place])   . Oxygen dependent     wears 2 liters at bedtime and when needed  . Headache   . Wears glasses     Past Surgical History  Procedure Laterality Date  . Carotid arteriogram  10/2010    30% right ICA stenosis, 40% left ICA stenosis   . Transesophageal  echocardiogram  09/2010    No ASD or PFO. EF 60-65%.  Normal systolic function. No evidence of thrombus.   . Transthoracic echocardiogram  09/2010     The cavity size was normal. Systolic function was vigorous.  EF 65-70%.  Normal wall funciton.   . Debridment of decubitus ulcer Right 02/13/2013  . I&d extremity Right 02/13/2013    Procedure: IRRIGATION AND DEBRIDEMENT FOOT ULCER;  Surgeon: Johnny Bridge, MD;  Location: [place];  Service: Orthopedics;  Laterality: Right;  PULSE LAVAGE  . Cataract extraction, bilateral    . Arm surgery Left     as a child  . Amputation Right 03/29/2013    Procedure: AMPUTATION RAY;  Surgeon: Newt Minion, MD;  Location: [place];  Service: Orthopedics;  Laterality: Right;  Right Foot 3rd and Possible 4th Ray Amputation  . Multiple tooth extractions    . Amputation Right 04/23/2013    Procedure: AMPUTATION RIGHT MID-FOOT;  Surgeon: Newt Minion, MD;  Location: [place];  Service: Orthopedics;  Laterality: Right;  . Esophagogastroduodenoscopy (egd) with propofol N/A 06/10/2014    Procedure: ESOPHAGOGASTRODUODENOSCOPY (EGD) WITH PROPOFOL;  Surgeon: Jerene Bears, MD;  [facility]: [facility];  Service: Gastroenterology;  Laterality: N/A;  . Colonoscopy with propofol N/A 06/10/2014    Procedure: COLONOSCOPY WITH PROPOFOL;  Surgeon: Jerene Bears, MD;  Location: [facility];  Service: Gastroenterology;  Laterality: N/A;  . Givens capsule study N/A 07/09/2014    Procedure: GIVENS CAPSULE STUDY;  Surgeon: Jerene Bears, MD;  Location: [facility];  Service: Gastroenterology;  Laterality: N/A;  . Cardiac catheterization N/A 04/30/2015    Procedure: Left Heart Cath and Coronary Angiography;  Surgeon: Belva Crome, MD;  Location: [place] [facility];  Service: Cardiovascular;  Laterality: N/A;  . Coronary artery bypass graft N/A 08/13/2015    Procedure: CORONARY ARTERY BYPASS GRAFTING (CABG), ON PUMP, TIMES THREE, USING LEFT INTERNAL MAMMARY ARTERY, RIGHT GREATER SAPHENOUS VEIN HARVESTED  ENDOSCOPICALLY;  Surgeon: [name], MD;  Location: [place] [facility];  Service: Open Heart Surgery;  Laterality: N/A;  . Tee without cardioversion N/A 08/13/2015    Procedure: TRANSESOPHAGEAL ECHOCARDIOGRAM (TEE);  Surgeon: [name], MD;  Location: [place];  Service: Open Heart Surgery;  Laterality: N/A;  . Endarterectomy Left 08/13/2015    Procedure: ENDARTERECTOMY CAROTID;  Surgeon: Angelia Mould, MD;  Location: [place];  Service: Vascular;  Laterality: Left;  . Carotid endarterectomy  Left 08-13-15    CEA    Current Medications: Outpatient Prescriptions Prior to Visit  Medication Sig Dispense Refill  . acetaminophen (TYLENOL) 325 MG tablet Take 2 tablets (650 mg total) by mouth every 6 (six) hours as needed for mild pain.    Marland Kitchen aspirin EC 81 MG EC tablet Take 1 tablet (81 mg total) by mouth daily. 90 tablet 3  . atorvastatin (LIPITOR) 80 MG tablet Take 1 tablet (80 mg total) by mouth daily at 6 PM. 45 tablet 0  . Cholecalciferol (VITAMIN D3) 1000 UNITS CAPS Take 1,000 Units by mouth daily.    . clonazePAM (KLONOPIN) 0.5 MG tablet Take 1 tablet (0.5 mg total) by mouth at bedtime. 30 tablet 0  . dabigatran (PRADAXA) 150 MG CAPS capsule Take 1 capsule (150 mg total) by mouth every 12 (twelve) hours. 60 capsule 11  . digoxin (LANOXIN) 0.125 MG tablet Take 1 tablet (0.125 mg total) by mouth daily.    . ferrous gluconate (FERGON) 324 MG tablet Take 1 tablet (324 mg total) by mouth daily with breakfast. 60 tablet 0  . furosemide (LASIX) 20 MG tablet Take 1 tablet (20 mg total) by mouth daily. 30 tablet 3  . gabapentin (NEURONTIN) 300 MG capsule Take 1 capsule (300 mg total) by mouth 2 (two) times daily. 60 capsule 0  . HYDROcodone-acetaminophen (NORCO/VICODIN) 5-325 MG tablet Take 1 tablet by mouth every 6 (six) hours as needed for moderate pain. 30 tablet 0  . hydrocortisone 2.5 % ointment Apply topically 3 (three) times daily. To left leg around wound. Do not apply to broken skin. 30 g 0  . insulin  aspart (NOVOLOG) 100 UNIT/ML injection Inject 0-20 Units into the skin 3 (three) times daily with meals. Units of Insulin CBG 70-120=0       CBG 251-300=11 CBG 121-150=3     CBG 301-350=15 CBG 151-200=4     CBG 351-400=20 CBG 201-250=7     CBG>400, call MD 10 mL 11  . insulin glargine (LANTUS) 100 UNIT/ML injection Inject 0.2-0.6 mLs (20-60 Units total) into the skin 2 (two) times daily. 20 units in the morning, 60 units in the evening 10 mL 11  . levofloxacin (LEVAQUIN) 500 MG tablet Take 1 tablet (500 mg total) by mouth daily. 30 tablet 0  . metFORMIN (GLUCOPHAGE) 1000 MG tablet Take 1,000 mg by mouth 2 (two) times daily with a meal.    . metoprolol tartrate (LOPRESSOR) 25 MG tablet Take 1 tablet (25 mg total) by mouth 3 (three) times daily.    . mupirocin cream (BACTROBAN) 2 % Apply 1 application topically 2 (two) times daily. FOR SKIN TEARS 15 g 0  . omeprazole (PRILOSEC) 20 MG capsule Take 20 mg by mouth daily.    Marland Kitchen PARoxetine (PAXIL) 40 MG tablet Take 40 mg by mouth every morning.    . saccharomyces boulardii (FLORASTOR) 250 MG capsule Take 1 capsule (250 mg total) by mouth 2 (two) times daily. 60 capsule 2   No facility-administered medications prior to visit.     Allergies:   Penicillins   Social History   Social History  . Marital Status: Single    Spouse Name: N/A  . Number of Children: 0  . Years of Education: N/A   Occupational History  .     Social History Main Topics  . Smoking status: Former Smoker -- 1.00 packs/day for 42 years    Types: Cigarettes, Pipe    Quit date: 11/10/2010  . Smokeless  tobacco: Former Systems developer    Quit date: 10/02/2010  . Alcohol Use: No     Comment: " last drink was 2003" 08/11/15  . Drug Use: No  . Sexual Activity: Not on file   Other Topics Concern  . Not on file   Social History Narrative   Released from prison in 2003 after serving 2 years for indecency with a minor.    He smokes cigarettes greater than 30 years, also smoked a pipe.   Quit tobacco 09/2010.   Denies EtOH and drugs.      Family History:  The patient's family history includes Breast cancer in his maternal grandmother; Heart attack in his maternal grandmother; Heart disease in his brother, father, maternal grandmother, and mother; Hypertension in his sister; Peripheral vascular disease in his father; Stomach cancer in his maternal uncle. There is no history of Colon cancer or Stroke.   ROS:   Please see the history of present illness.    ROS All other systems reviewed and are negative.   Physical Exam:    VS:  There were no vitals taken for this visit.   GEN: Well nourished, well developed, in no acute distress HEENT: normal Neck: no JVD, no masses Cardiac: Normal S1/S2, RRR; no murmurs, rubs, or gallops, trace-1+ bilat LE edema; L CEA scar well healed  Chest Median sternotomy well healed, no erythema or d/c  Respiratory: decreased breath sounds at the bases bilaterally with scant crackles L base, no wheezing GI: soft, nontender  MS: no deformity or atrophy Skin: warm and dry, no rash Neuro:   no focal deficits  Psych: Alert and oriented x 3, normal affect  Wt Readings from Last 3 Encounters:  09/16/15 212 lb (96.163 kg)  09/09/15 212 lb (96.163 kg)  09/04/15 200 lb 6.4 oz (90.901 kg)     Studies/Labs Reviewed:    EKG:  EKG is  ordered today.  The ekg ordered today demonstrates    Recent Labs: 12/15/2014: Pro B Natriuretic peptide (BNP) 290.0* 05/19/2015: B Natriuretic Peptide 50.5 08/11/2015: ALT 11* 08/14/2015: Magnesium 2.2 08/21/2015: BUN 32*; Creatinine, Ser 1.21; Hemoglobin 8.7*; Platelets 341; Sodium 137 09/04/2015: Potassium 6.2*   Recent Lipid Panel    Component Value Date/Time   CHOL 99* 07/14/2015 1149   TRIG 156* 07/14/2015 1149   HDL 21* 07/14/2015 1149   CHOLHDL 4.7 07/14/2015 1149   VLDL 31* 07/14/2015 1149   LDLCALC 47 07/14/2015 1149   LDLDIRECT 54 08/23/2012 1447    Additional studies/ records that were reviewed today  include:   Dg Chest 2 View  09/16/2015   IMPRESSION: Interval improved aeration of the lung bases with mild residual atelectasis. No focal airspace disease or edema. Electronically Signed   By: Richardean Sale M.D.   On: 09/16/2015 08:45    CXR 09/02/15 IMPRESSION: LEFT lower lobe and lingular PNEUMONIA.  LHC 04/30/15 LAD: Ostial and mid 95%, distal 40%, ostial D1 100%, ostial D2 85% RI: Ostial 65% LCx: Proximal to mid 70%, mid 65%, OM2 90% RCA: Ostial 80%, proximal to distal 100%, distal 100%, RPDA 85%, RPAVB 100% EF 35-45%  Myoview 04/23/15 Large, mostly reversible inferior, apical and inferolateral reversible perfusion defect suggestive of ischemia (SDS 10, Extent 37%). This study was not-gated due to atrial fibrillation. This is a high-risk study based on the size and severity of the perfusion defect.  Carotid US 8/16 R 60-79% L 80-99%  Echo 12/18/14 Mild LVH, EF 55%, wall motion abnormalities could not be excluded, MAC,  trivial MR, mild LAE  Echo 10/16/12 Mild LVH, vigorous LVF, EF 65-70%, normal wall motion, grade 1 diastolic dysfunction  Nuclear study 09/06/12 Myoview scan with small apical defect Seen in horizontal images only. Cannot exclude tiny region of apical ischemia vs shift soft tissue. Otherwise normal perfusion. Overall low risk scan. LV Ejection Fraction: 69%. LV Wall Motion: NL LV Function; NL Wall Motion    ASSESSMENT:    1. Coronary artery disease involving native coronary artery of native heart without angina pectoris   2. Chronic combined systolic and diastolic CHF (congestive heart failure) ([place])   3. Ischemic cardiomyopathy   4. PAF (paroxysmal atrial fibrillation) ([place])   5. Essential hypertension   6. Carotid stenosis, bilateral   7. Hyperkalemia     PLAN:    In order of problems listed above:  1. CAD - History of non-STEMI in 3/16 while hospitalized for acute, multiple, medical illnesses. Now s/p CABG + L CEA.  Continue ASA, Lipitor,  beta-blocker.     2. Chronic combined systolic and diastolic CHF -   3. Ischemic Cardiomyopathy -  EF 55% by echo in 5/16. Recent nuclear study and LHC with EF 35-45%.Continue beta-blocker.  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  ACE should be avoided.  Repeat Echo 90 days post CABG.  4. PAF - Maintaining NSR. He remains on Pradaxa for anticoagulation. He has a hx of CVA.   5. HTN - Controlled.  6. Carotid stenosis, bilateral - s/p L CEA.  FU with VVS.    7. Hyperkalemia -      Medication Adjustments/Labs and Tests Ordered: Current medicines are reviewed at length with the patient today.  Concerns regarding medicines are outlined above.  Medication changes, Labs and Tests ordered today are outlined in the Patient Instructions noted below. There are no Patient Instructions on file for this visit.Signed, Richardson Dopp, PA-C  09/17/2015 10:14 AM    [facility] [address], [place], [place]  [address] Phone: [phone]; Fax: [phone]     This encounter was created in error - please disregard.

## 2015-09-17 ENCOUNTER — Encounter: Payer: Medicare (Managed Care) | Admitting: Physician Assistant

## 2015-09-17 DIAGNOSIS — I255 Ischemic cardiomyopathy: Secondary | ICD-10-CM | POA: Insufficient documentation

## 2015-10-11 NOTE — Progress Notes (Signed)
Cardiology Office Note:    Date:  10/11/2015   ID:  [name], DOB 04-27-1956, MRN [id number]  PCP:  [name], MD  Cardiologist: [name]  Electrophysiologist: N/a VVS: Dr. Scot Dock TCTS: Dr. Cyndia Bent  Chief Complaint  Patient presents with  . Congestive Heart Failure    follow up  . Coronary Artery Disease    follow up    History of Present Illness:     [name] is a 60 y.o. male with a hx of  CAD, combined systolic and diastolic CHF, ischemic CM, PAF, HTN, diabetes, HL, prior stroke, carotid stenosis, COPD, intellectual disability, diabetic ulcer s/p R midfoot amputation 2014.   Admitted 3/16 with NSTEMI in the setting of sepsis syndrome c/b acute hypoxic respiratory failure secondary to a/c diastolic CHF, AECOPD and AF with RVR. Given his multiple comorbid conditions, it was decided to treat him conservatively for his non-STEMI. In 8/16, he was noted to have progressive carotid artery stenosis on the left. Risk stratification for L CEA was done with nuclear stress test that was high risk. [place] 9/16 demonstrated 3v CAD and CABG + CEA was recommended.But, his A1c was 10.7. He was felt to be high risk for perioperative complications and surgery was postponed. After intensification of his diabetic regimen, his Hgb A1c improved to 5.6. He underwent CABG with L-LAD, S-RI, S-OM2 + L CEA on 08/13/15. He had several episodes of AF postoperatively. He was placed on amiodarone. But, the amiodarone was stopped 2/2 to bradycardia. He was DC to SNF.   After DC, he was seen by Dr. Bradd Burner and noted to be volume overloaded. CXR also confirmed pneumonia and he was placed on antibiotics. He has a hx of RTA Type 4 and his K+ was 6.3. Dr. Bradd Burner arranged Kayexalate. Other labs: Hgb 8.8 (8.7 at DC on 1/13), Creatinine 0.75, Dig 0.9. Lasix was started. I saw him 1/27. No further changes were made. Given his volume excess and pneumonia, I brought him  back for close FU.   He saw Dr. Cyndia Bent yesterday. Follow-up chest x-ray was improved. He returns for follow-up.    Past Medical History  Diagnosis Date  . Hypertension   . Intellectual disability     Sister helps to take care of him and takes him to appts  . Tobacco user     Smokes 1ppd for multiple years.  Quit after hosp 09/2010.  Marland Kitchen Anemia     History of Iron Def Anemia  . Anxiety   . Hyperlipidemia   . PAF (paroxysmal atrial fibrillation) ([place]) 06/2009    CHADS score 2 (HTN, DM), was not on coumadin, but now on Pradaxa for Afib  . Lung nodule   . Chronic low back pain   . CVA (cerebral infarction) 09/2010    Bilateral with Left > Right  . Abdominal hernia     Chronic, not a good surgical candidate  . Carotid artery occlusion   . Obesity   . COPD (chronic obstructive pulmonary disease) ([place])   . Abscess, abdomen ([place]) 12/31/2010    Referred to Wound Care in 01/2011 because of multiple abd abscess with VERY large ventral hernia (please look at image of CT abd/pelvis 09/2010).  Because of hernia I was hesitant to I&D.      Marland Kitchen GERD (gastroesophageal reflux disease)   . Pneumonia     hx of  . Chronic diastolic CHF (congestive heart failure) (H[facility])     takes Lasix  . Itching  all over body; pt scratches and has sores on bilateral arms and abdomen  . Hx of echocardiogram     Echo 5/16:  Mild LVH, EF 55%, indeterm. diast function, WMA could not be ruled out, MAC, trivial MR, mild LAE, normal RVF  . Myocardial infarction [facility]) 2016 ?    Heart attack  (  Per  pt. )  . History of nuclear stress test     Myoview 9/16:  Inferior, apical and inf-lateral ischemia; not gated; High Risk  . Shortness of breath dyspnea     with exertion  . Diabetes mellitus   . Stroke ([place])   . Oxygen dependent     wears 2 liters at bedtime and when needed  . Headache   . Wears glasses     Past Surgical History  Procedure Laterality Date  . Carotid arteriogram  10/2010    30% right ICA stenosis,  40% left ICA stenosis   . Transesophageal echocardiogram  09/2010    No ASD or PFO. EF 60-65%.  Normal systolic function. No evidence of thrombus.   . Transthoracic echocardiogram  09/2010     The cavity size was normal. Systolic function was vigorous.  EF 65-70%.  Normal wall funciton.   . Debridment of decubitus ulcer Right 02/13/2013  . I&d extremity Right 02/13/2013    Procedure: IRRIGATION AND DEBRIDEMENT FOOT ULCER;  Surgeon: Johnny Bridge, MD;  Location: [place];  Service: Orthopedics;  Laterality: Right;  PULSE LAVAGE  . Cataract extraction, bilateral    . Arm surgery Left     as a child  . Amputation Right 03/29/2013    Procedure: AMPUTATION RAY;  Surgeon: Newt Minion, MD;  Location: [place];  Service: Orthopedics;  Laterality: Right;  Right Foot 3rd and Possible 4th Ray Amputation  . Multiple tooth extractions    . Amputation Right 04/23/2013    Procedure: AMPUTATION RIGHT MID-FOOT;  Surgeon: Newt Minion, MD;  Location: [facility];  Service: Orthopedics;  Laterality: Right;  . Esophagogastroduodenoscopy (egd) with propofol N/A 06/10/2014    Procedure: ESOPHAGOGASTRODUODENOSCOPY (EGD) WITH PROPOFOL;  Surgeon: Jerene Bears, MD;  Location: [facility];  Service: Gastroenterology;  Laterality: N/A;  . Colonoscopy with propofol N/A 06/10/2014    Procedure: COLONOSCOPY WITH PROPOFOL;  Surgeon: Jerene Bears, MD;  Location: [facility];  Service: Gastroenterology;  Laterality: N/A;  . Givens capsule study N/A 07/09/2014    Procedure: GIVENS CAPSULE STUDY;  Surgeon: Jerene Bears, MD;  Location: [facility];  Service: Gastroenterology;  Laterality: N/A;  . Cardiac catheterization N/A 04/30/2015    Procedure: Left Heart Cath and Coronary Angiography;  Surgeon: Belva Crome, MD;  Location: [facility];  Service: Cardiovascular;  Laterality: N/A;  . Coronary artery bypass graft N/A 08/13/2015    Procedure: CORONARY ARTERY BYPASS GRAFTING (CABG), ON PUMP, TIMES THREE, USING LEFT INTERNAL MAMMARY  ARTERY, RIGHT GREATER SAPHENOUS VEIN HARVESTED ENDOSCOPICALLY;  Surgeon: [name], MD;  Location: [facility];  Service: Open Heart Surgery;  Laterality: N/A;  . Tee without cardioversion N/A 08/13/2015    Procedure: TRANSESOPHAGEAL ECHOCARDIOGRAM (TEE);  Surgeon: [name], MD;  Location: [facility];  Service: Open Heart Surgery;  Laterality: N/A;  . Endarterectomy Left 08/13/2015    Procedure: ENDARTERECTOMY CAROTID;  Surgeon: Angelia Mould, MD;  Location: [facility];  Service: Vascular;  Laterality: Left;  . Carotid endarterectomy Left 08-13-15    CEA    Current Medications: Outpatient Prescriptions Prior  to Visit  Medication Sig Dispense Refill  . acetaminophen (TYLENOL) 325 MG tablet Take 2 tablets (650 mg total) by mouth every 6 (six) hours as needed for mild pain.    Marland Kitchen aspirin EC 81 MG EC tablet Take 1 tablet (81 mg total) by mouth daily. 90 tablet 3  . atorvastatin (LIPITOR) 80 MG tablet Take 1 tablet (80 mg total) by mouth daily at 6 PM. 45 tablet 0  . Cholecalciferol (VITAMIN D3) 1000 UNITS CAPS Take 1,000 Units by mouth daily.    . clonazePAM (KLONOPIN) 0.5 MG tablet Take 1 tablet (0.5 mg total) by mouth at bedtime. 30 tablet 0  . dabigatran (PRADAXA) 150 MG CAPS capsule Take 1 capsule (150 mg total) by mouth every 12 (twelve) hours. 60 capsule 11  . digoxin (LANOXIN) 0.125 MG tablet Take 1 tablet (0.125 mg total) by mouth daily.    . ferrous gluconate (FERGON) 324 MG tablet Take 1 tablet (324 mg total) by mouth daily with breakfast. 60 tablet 0  . furosemide (LASIX) 20 MG tablet Take 1 tablet (20 mg total) by mouth daily. 30 tablet 3  . gabapentin (NEURONTIN) 300 MG capsule Take 1 capsule (300 mg total) by mouth 2 (two) times daily. 60 capsule 0  . HYDROcodone-acetaminophen (NORCO/VICODIN) 5-325 MG tablet Take 1 tablet by mouth every 6 (six) hours as needed for moderate pain. 30 tablet 0  . hydrocortisone 2.5 % ointment Apply topically 3 (three) times daily. To left leg around wound.  Do not apply to broken skin. 30 g 0  . insulin aspart (NOVOLOG) 100 UNIT/ML injection Inject 0-20 Units into the skin 3 (three) times daily with meals. Units of Insulin CBG 70-120=0       CBG 251-300=11 CBG 121-150=3     CBG 301-350=15 CBG 151-200=4     CBG 351-400=20 CBG 201-250=7     CBG>400, call MD 10 mL 11  . insulin glargine (LANTUS) 100 UNIT/ML injection Inject 0.2-0.6 mLs (20-60 Units total) into the skin 2 (two) times daily. 20 units in the morning, 60 units in the evening 10 mL 11  . levofloxacin (LEVAQUIN) 500 MG tablet Take 1 tablet (500 mg total) by mouth daily. 30 tablet 0  . metFORMIN (GLUCOPHAGE) 1000 MG tablet Take 1,000 mg by mouth 2 (two) times daily with a meal.    . metoprolol tartrate (LOPRESSOR) 25 MG tablet Take 1 tablet (25 mg total) by mouth 3 (three) times daily.    . mupirocin cream (BACTROBAN) 2 % Apply 1 application topically 2 (two) times daily. FOR SKIN TEARS 15 g 0  . omeprazole (PRILOSEC) 20 MG capsule Take 20 mg by mouth daily.    Marland Kitchen PARoxetine (PAXIL) 40 MG tablet Take 40 mg by mouth every morning.    . saccharomyces boulardii (FLORASTOR) 250 MG capsule Take 1 capsule (250 mg total) by mouth 2 (two) times daily. 60 capsule 2   No facility-administered medications prior to visit.     Allergies:   Penicillins   Social History   Social History  . Marital Status: Single    Spouse Name: N/A  . Number of Children: 0  . Years of Education: N/A   Occupational History  .     Social History Main Topics  . Smoking status: Former Smoker -- 1.00 packs/day for 42 years    Types: Cigarettes, Pipe    Quit date: 11/10/2010  . Smokeless tobacco: Former Systems developer    Quit date: 10/02/2010  . Alcohol Use: No  Comment: " last drink was 2003" 08/11/15  . Drug Use: No  . Sexual Activity: Not on file   Other Topics Concern  . Not on file   Social History Narrative   Released from prison in 2003 after serving 2 years for indecency with a minor.    He smokes  cigarettes greater than 30 years, also smoked a pipe.  Quit tobacco 09/2010.   Denies EtOH and drugs.      Family History:  The patient's family history includes Breast cancer in his maternal grandmother; Heart attack in his maternal grandmother; Heart disease in his brother, father, maternal grandmother, and mother; Hypertension in his sister; Peripheral vascular disease in his father; Stomach cancer in his maternal uncle. There is no history of Colon cancer or Stroke.   ROS:   Please see the history of present illness.    ROS All other systems reviewed and are negative.   Physical Exam:    VS:  There were no vitals taken for this visit.   GEN: Well nourished, well developed, in no acute distress HEENT: normal Neck: no JVD, no masses Cardiac: Normal S1/S2, RRR; no murmurs, rubs, or gallops, no edema;   carotid bruits,   Respiratory:  clear to auscultation bilaterally; no wheezing, rhonchi or rales GI: soft, nontender, nondistended, + BS MS: no deformity or atrophy Skin: warm and dry, no rash Neuro:  Bilateral strength equal, no focal deficits  Psych: Alert and oriented x 3, normal affect  Wt Readings from Last 3 Encounters:  09/16/15 212 lb (96.163 kg)  09/09/15 212 lb (96.163 kg)  09/04/15 200 lb 6.4 oz (90.901 kg)      Studies/Labs Reviewed:     EKG:  EKG is  ordered today.  The ekg ordered today demonstrates   Recent Labs: 12/15/2014: Pro B Natriuretic peptide (BNP) 290.0* 05/19/2015: B Natriuretic Peptide 50.5 08/11/2015: ALT 11* 08/14/2015: Magnesium 2.2 08/21/2015: BUN 32*; Creatinine, Ser 1.21; Hemoglobin 8.7*; Platelets 341; Sodium 137 09/04/2015: Potassium 6.2*   Recent Lipid Panel    Component Value Date/Time   CHOL 99* 07/14/2015 1149   TRIG 156* 07/14/2015 1149   HDL 21* 07/14/2015 1149   CHOLHDL 4.7 07/14/2015 1149   VLDL 31* 07/14/2015 1149   LDLCALC 47 07/14/2015 1149   LDLDIRECT 54 08/23/2012 1447    Additional studies/ records that were reviewed today  include:    Dg Chest 2 View 09/16/2015  IMPRESSION: Interval improved aeration of the lung bases with mild residual atelectasis. No focal airspace disease or edema. Electronically Signed By: Richardean Sale M.D. On: 09/16/2015 08:45   CXR 09/02/15 IMPRESSION: LEFT lower lobe and lingular PNEUMONIA.  LHC 04/30/15 LAD: Ostial and mid 95%, distal 40%, ostial D1 100%, ostial D2 85% RI: Ostial 65% LCx: Proximal to mid 70%, mid 65%, OM2 90% RCA: Ostial 80%, proximal to distal 100%, distal 100%, RPDA 85%, RPAVB 100% EF 35-45%  Myoview 04/23/15 Large, mostly reversible inferior, apical and inferolateral reversible perfusion defect suggestive of ischemia (SDS 10, Extent 37%). This study was not-gated due to atrial fibrillation. This is a high-risk study based on the size and severity of the perfusion defect.  Carotid US 8/16 R 60-79% L 80-99%  Echo 12/18/14 Mild LVH, EF 55%, wall motion abnormalities could not be excluded, MAC, trivial MR, mild LAE  Echo 10/16/12 Mild LVH, vigorous LVF, EF 65-70%, normal wall motion, grade 1 diastolic dysfunction  Nuclear study 09/06/12 Myoview scan with small apical defect Seen in horizontal images only. Cannot  exclude tiny region of apical ischemia vs shift soft tissue. Otherwise normal perfusion. Overall low risk scan. LV Ejection Fraction: 69%. LV Wall Motion: NL LV Function; NL Wall Motion   ASSESSMENT:     No diagnosis found.  PLAN:     In order of problems listed above:  1. CAD - History of non-STEMI in 3/16 while hospitalized for acute, multiple, medical illnesses. Now s/p CABG + L CEA. Continue ASA, Lipitor, beta-blocker.   2. Chronic combined systolic and diastolic CHF -   3. Ischemic Cardiomyopathy - EF 55% by echo in 5/16. Recent nuclear study and LHC with EF 35-45%.Continue beta-blocker. 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 ACE should be avoided. Repeat Echo 90 days post CABG.  4. PAF - Maintaining NSR. He  remains on Pradaxa for anticoagulation. He has a hx of CVA.   5. HTN - Controlled.  6. Carotid stenosis, bilateral - s/p L CEA. FU with VVS.   7. Hyperkalemia -     Medication Adjustments/Labs and Tests Ordered: Current medicines are reviewed at length with the patient today.  Concerns regarding medicines are outlined above.  Medication changes, Labs and Tests ordered today are outlined in the Patient Instructions noted below. There are no Patient Instructions on file for this visit. Signed, Richardson Dopp, PA-C  10/11/2015 8:53 PM    [facility] [address], [place], [place]  [address] Phone: [phone]; Fax: [phone]     This encounter was created in error - please disregard.

## 2015-10-12 ENCOUNTER — Telehealth: Payer: Self-pay | Admitting: Physician Assistant

## 2015-10-12 ENCOUNTER — Encounter: Payer: Medicare (Managed Care) | Admitting: Physician Assistant

## 2015-10-12 NOTE — Telephone Encounter (Signed)
Please call [facility] and reschedule visit from today that he missed Richardson Dopp, PA-C   10/12/2015 5:34 PM

## 2015-10-14 NOTE — Telephone Encounter (Signed)
I lmom at [facility] to have pt rescheduled with Brynda Rim. PA. PA never saw pt on 3/6. Pt had left the office when it came time for his appt. Pt was dropped off about 1 hour early. Per Brynda Rim. PA pt needs to be rescheduled .

## 2015-10-16 NOTE — Telephone Encounter (Signed)
Pt coming in Monday 10/19/15

## 2015-10-25 NOTE — Progress Notes (Signed)
Cardiology Office Note:    Date:  10/26/2015   ID:  [name], DOB June 22, 1956, MRN [id number]  PCP:  [name], MD  Cardiologist: [name]  Electrophysiologist: N/a VVS: Dr. Scot Dock TCTS: Dr. Cyndia Bent  Chief Complaint  Patient presents with  . Congestive Heart Failure    Follow up    History of Present Illness:     [name] is a 60 y.o. male with a hx of CAD, combined systolic and diastolic CHF, ischemic CM, PAF, HTN, diabetes, HL, prior stroke, carotid stenosis, COPD, intellectual disability, diabetic ulcer s/p R midfoot amputation 2014.   Admitted 3/16 with NSTEMI in the setting of sepsis syndrome c/b acute hypoxic respiratory failure secondary to a/c diastolic CHF, AECOPD and AF with RVR. Given his multiple comorbid conditions, it was decided to treat him conservatively for his non-STEMI. In 8/16, he was noted to have progressive carotid artery stenosis on the left. Risk stratification for L CEA was done with nuclear stress test that was high risk. [place] 9/16 demonstrated 3v CAD and CABG + CEA was recommended.But, his A1c was 10.7 and he was felt to be high risk for perioperative complications and surgery was postponed. After intensification of his diabetic regimen, his Hgb A1c improved to 5.6. He underwent CABG with L-LAD, S-RI, S-OM2 + L CEA on 08/13/15. He had several episodes of AF postoperatively. He was placed on amiodarone. But, the amiodarone was stopped 2/2 to bradycardia.   He is followed closely at [facility] by Dr. Bradd Burner. After discharge, he was noted to be volume overloaded and CXR confirmed pneumonia. He was placed on antibiotics and diuretics. He has a hx of RTA Type 4 and his K+ was 6.3 and he was treated with Kayexalate.  He has missed his last couple of appointments with me. Of note, at his last visit, he was dropped off but then picked up before he was called back to be roomed. He returns today for follow-up.  Here alone.  He  states he is feeling really good since his CABG. Denies chest pain.  He remains somewhat SOB with exertion. Sleeps on 2 pillows chronically.  No PND, edema.  No syncope.  No bleeding issues other than some occ hemorrhoidal bleeding.     Past Medical History  Diagnosis Date  . Hypertension   . Intellectual disability     Sister helps to take care of him and takes him to appts  . Tobacco user     Smokes 1ppd for multiple years.  Quit after hosp 09/2010.  Marland Kitchen Anemia     History of Iron Def Anemia  . Anxiety   . Hyperlipidemia   . PAF (paroxysmal atrial fibrillation) ([place]) 06/2009    CHADS score 2 (HTN, DM), was not on coumadin, but now on Pradaxa for Afib  . Lung nodule   . Chronic low back pain   . CVA (cerebral infarction) 09/2010    Bilateral with Left > Right  . Abdominal hernia     Chronic, not a good surgical candidate  . Carotid artery occlusion   . Obesity   . COPD (chronic obstructive pulmonary disease) ([place])   . Abscess, abdomen ([place]) 12/31/2010    Referred to Wound Care in 01/2011 because of multiple abd abscess with VERY large ventral hernia (please look at image of CT abd/pelvis 09/2010).  Because of hernia I was hesitant to I&D.      Marland Kitchen GERD (gastroesophageal reflux disease)   . Pneumonia  hx of  . Chronic diastolic CHF (congestive heart failure) ([place])     takes Lasix  . Itching     all over body; pt scratches and has sores on bilateral arms and abdomen  . Hx of echocardiogram     Echo 5/16:  Mild LVH, EF 55%, indeterm. diast function, WMA could not be ruled out, MAC, trivial MR, mild LAE, normal RVF  . Myocardial infarction [facility]) 2016 ?    Heart attack  (  Per  pt. )  . History of nuclear stress test     Myoview 9/16:  Inferior, apical and inf-lateral ischemia; not gated; High Risk  . Shortness of breath dyspnea     with exertion  . Diabetes mellitus   . Stroke ([place])   . Oxygen dependent     wears 2 liters at bedtime and when needed  . Headache   . Wears glasses      Past Surgical History  Procedure Laterality Date  . Carotid arteriogram  10/2010    30% right ICA stenosis, 40% left ICA stenosis   . Transesophageal echocardiogram  09/2010    No ASD or PFO. EF 60-65%.  Normal systolic function. No evidence of thrombus.   . Transthoracic echocardiogram  09/2010     The cavity size was normal. Systolic function was vigorous.  EF 65-70%.  Normal wall funciton.   . Debridment of decubitus ulcer Right 02/13/2013  . I&d extremity Right 02/13/2013    Procedure: IRRIGATION AND DEBRIDEMENT FOOT ULCER;  Surgeon: Johnny Bridge, MD;  Location: [place];  Service: Orthopedics;  Laterality: Right;  PULSE LAVAGE  . Cataract extraction, bilateral    . Arm surgery Left     as a child  . Amputation Right 03/29/2013    Procedure: AMPUTATION RAY;  Surgeon: Newt Minion, MD;  Location: [place];  Service: Orthopedics;  Laterality: Right;  Right Foot 3rd and Possible 4th Ray Amputation  . Multiple tooth extractions    . Amputation Right 04/23/2013    Procedure: AMPUTATION RIGHT MID-FOOT;  Surgeon: Newt Minion, MD;  Location: [place];  Service: Orthopedics;  Laterality: Right;  . Esophagogastroduodenoscopy (egd) with propofol N/A 06/10/2014    Procedure: ESOPHAGOGASTRODUODENOSCOPY (EGD) WITH PROPOFOL;  Surgeon: Jerene Bears, MD;  Location: [facility];  Service: Gastroenterology;  Laterality: N/A;  . Colonoscopy with propofol N/A 06/10/2014    Procedure: COLONOSCOPY WITH PROPOFOL;  Surgeon: Jerene Bears, MD;  Location: [facility];  Service: Gastroenterology;  Laterality: N/A;  . Givens capsule study N/A 07/09/2014    Procedure: GIVENS CAPSULE STUDY;  Surgeon: Jerene Bears, MD;  Location: [facility];  Service: Gastroenterology;  Laterality: N/A;  . Cardiac catheterization N/A 04/30/2015    Procedure: Left Heart Cath and Coronary Angiography;  Surgeon: Belva Crome, MD;  Location: [place] [facility];  Service: Cardiovascular;  Laterality: N/A;  . Coronary artery bypass graft N/A  08/13/2015    Procedure: CORONARY ARTERY BYPASS GRAFTING (CABG), ON PUMP, TIMES THREE, USING LEFT INTERNAL MAMMARY ARTERY, RIGHT GREATER SAPHENOUS VEIN HARVESTED ENDOSCOPICALLY;  Surgeon: [name], MD;  Location: [place] OR;  Service: Open Heart Surgery;  Laterality: N/A;  . Tee without cardioversion N/A 08/13/2015    Procedure: TRANSESOPHAGEAL ECHOCARDIOGRAM (TEE);  Surgeon: [name], MD;  Location: [place];  Service: Open Heart Surgery;  Laterality: N/A;  . Endarterectomy Left 08/13/2015    Procedure: ENDARTERECTOMY CAROTID;  Surgeon: Angelia Mould, MD;  Location: [place];  Service: Vascular;  Laterality: Left;  . Carotid endarterectomy Left 08-13-15    CEA    Current Medications: Outpatient Prescriptions Prior to Visit  Medication Sig Dispense Refill  . acetaminophen (TYLENOL) 325 MG tablet Take 2 tablets (650 mg total) by mouth every 6 (six) hours as needed for mild pain.    Marland Kitchen aspirin EC 81 MG EC tablet Take 1 tablet (81 mg total) by mouth daily. 90 tablet 3  . atorvastatin (LIPITOR) 80 MG tablet Take 1 tablet (80 mg total) by mouth daily at 6 PM. 45 tablet 0  . Cholecalciferol (VITAMIN D3) 1000 UNITS CAPS Take 1,000 Units by mouth daily.    . clonazePAM (KLONOPIN) 0.5 MG tablet Take 1 tablet (0.5 mg total) by mouth at bedtime. 30 tablet 0  . digoxin (LANOXIN) 0.125 MG tablet Take 1 tablet (0.125 mg total) by mouth daily.    . ferrous gluconate (FERGON) 324 MG tablet Take 1 tablet (324 mg total) by mouth daily with breakfast. 60 tablet 0  . furosemide (LASIX) 20 MG tablet Take 1 tablet (20 mg total) by mouth daily. 30 tablet 3  . gabapentin (NEURONTIN) 300 MG capsule Take 1 capsule (300 mg total) by mouth 2 (two) times daily. 60 capsule 0  . HYDROcodone-acetaminophen (NORCO/VICODIN) 5-325 MG tablet Take 1 tablet by mouth every 6 (six) hours as needed for moderate pain. 30 tablet 0  . hydrocortisone 2.5 % ointment Apply topically 3 (three) times daily. To left leg around wound. Do not  apply to broken skin. 30 g 0  . insulin aspart (NOVOLOG) 100 UNIT/ML injection Inject 0-20 Units into the skin 3 (three) times daily with meals. Units of Insulin CBG 70-120=0       CBG 251-300=11 CBG 121-150=3     CBG 301-350=15 CBG 151-200=4     CBG 351-400=20 CBG 201-250=7     CBG>400, call MD 10 mL 11  . levofloxacin (LEVAQUIN) 500 MG tablet Take 1 tablet (500 mg total) by mouth daily. 30 tablet 0  . metFORMIN (GLUCOPHAGE) 1000 MG tablet Take 1,000 mg by mouth 2 (two) times daily with a meal.    . metoprolol tartrate (LOPRESSOR) 25 MG tablet Take 1 tablet (25 mg total) by mouth 3 (three) times daily.    . mupirocin cream (BACTROBAN) 2 % Apply 1 application topically 2 (two) times daily. FOR SKIN TEARS 15 g 0  . omeprazole (PRILOSEC) 20 MG capsule Take 20 mg by mouth daily.    Marland Kitchen PARoxetine (PAXIL) 40 MG tablet Take 40 mg by mouth every morning.    . saccharomyces boulardii (FLORASTOR) 250 MG capsule Take 1 capsule (250 mg total) by mouth 2 (two) times daily. 60 capsule 2  . dabigatran (PRADAXA) 150 MG CAPS capsule Take 1 capsule (150 mg total) by mouth every 12 (twelve) hours. 60 capsule 11  . insulin glargine (LANTUS) 100 UNIT/ML injection Inject 0.2-0.6 mLs (20-60 Units total) into the skin 2 (two) times daily. 20 units in the morning, 60 units in the evening 10 mL 11   No facility-administered medications prior to visit.     Allergies:   Penicillins   Social History   Social History  . Marital Status: Single    Spouse Name: N/A  . Number of Children: 0  . Years of Education: N/A   Occupational History  .     Social History Main Topics  . Smoking status: Former Smoker -- 1.00 packs/day for 42 years    Types: Cigarettes, Pipe  Quit date: 11/10/2010  . Smokeless tobacco: Former Systems developer    Quit date: 10/02/2010  . Alcohol Use: No     Comment: " last drink was 2003" 08/11/15  . Drug Use: No  . Sexual Activity: Not Asked   Other Topics Concern  . None   Social History  Narrative   Released from prison in 2003 after serving 2 years for indecency with a minor.    He smokes cigarettes greater than 30 years, also smoked a pipe.  Quit tobacco 09/2010.   Denies EtOH and drugs.      Family History:  The patient's family history includes Breast cancer in his maternal grandmother; Heart attack in his maternal grandmother; Heart disease in his brother, father, maternal grandmother, and mother; Hypertension in his sister; Peripheral vascular disease in his father; Stomach cancer in his maternal uncle. There is no history of Colon cancer or Stroke.   ROS:   Please see the history of present illness.    ROS All other systems reviewed and are negative.   Physical Exam:    VS:  BP 120/70 mmHg  Pulse 62  Ht 5\' 2"  (1.575 m)  Wt 201 lb (91.173 kg)  BMI 36.75 kg/m2  SpO2 96%   GEN: Well nourished, well developed, in no acute distress HEENT: normal Neck: no obvious JVD, no masses Cardiac: Normal S1/S2,  RRR; brief 1/6 systolic murmur RUSB,  no edema;     Respiratory:  Decreased breath sounds bilaterally; no wheezing, rhonchi or rales GI: soft, nontender  MS: no deformity or atrophy Skin: warm and dry Neuro: No focal deficits  Psych: Alert and oriented x 3, normal affect  Wt Readings from Last 3 Encounters:  10/26/15 201 lb (91.173 kg)  09/16/15 212 lb (96.163 kg)  09/09/15 212 lb (96.163 kg)      Studies/Labs Reviewed:     EKG:  EKG is  Not ordered today.  The ekg ordered today demonstrates n/a  Recent Labs: 12/15/2014: Pro B Natriuretic peptide (BNP) 290.0* 05/19/2015: B Natriuretic Peptide 50.5 08/11/2015: ALT 11* 08/14/2015: Magnesium 2.2 08/21/2015: BUN 32*; Creatinine, Ser 1.21; Hemoglobin 8.7*; Platelets 341; Sodium 137 09/04/2015: Potassium 6.2*   Recent Lipid Panel    Component Value Date/Time   CHOL 99* 07/14/2015 1149   TRIG 156* 07/14/2015 1149   HDL 21* 07/14/2015 1149   CHOLHDL 4.7 07/14/2015 1149   VLDL 31* 07/14/2015 1149   LDLCALC 47  07/14/2015 1149   LDLDIRECT 54 08/23/2012 1447    Additional studies/ records that were reviewed today include:   LHC 04/30/15 LAD: Ostial and mid 95%, distal 40%, ostial D1 100%, ostial D2 85% RI: Ostial 65% LCx: Proximal to mid 70%, mid 65%, OM2 90% RCA: Ostial 80%, proximal to distal 100%, distal 100%, RPDA 85%, RPAVB 100% EF 35-45%  Myoview 04/23/15 Large, mostly reversible inferior, apical and inferolateral reversible perfusion defect suggestive of ischemia (SDS 10, Extent 37%). This study was not-gated due to atrial fibrillation. This is a high-risk study based on the size and severity of the perfusion defect.  Carotid US 8/16 R 60-79% L 80-99%  Echo 12/18/14 Mild LVH, EF 55%, wall motion abnormalities could not be excluded, MAC, trivial MR, mild LAE  Echo 10/16/12 Mild LVH, vigorous LVF, EF 65-70%, normal wall motion, grade 1 diastolic dysfunction  Nuclear study 09/06/12 Myoview scan with small apical defect Seen in horizontal images only. Cannot exclude tiny region of apical ischemia vs shift soft tissue. Otherwise normal perfusion. Overall low risk  scan. LV Ejection Fraction: 69%. LV Wall Motion: NL LV Function; NL Wall Motion    ASSESSMENT:     1. Coronary artery disease involving native coronary artery of native heart without angina pectoris   2. Chronic combined systolic and diastolic CHF (congestive heart failure) ([place])   3. Ischemic cardiomyopathy   4. PAF (paroxysmal atrial fibrillation) ([place])   5. Essential hypertension   6. Carotid stenosis, bilateral   7. HYPERCHOLESTEROLEMIA     PLAN:     In order of problems listed above:  1. CAD - History of non-STEMI in 3/16 while hospitalized for acute, multiple, medical illnesses. Now s/p CABG + L CEA. He has recovered nicely.  He states he is feeling good.  He is doing some re[facility] at [facility].  Continue ASA, Lipitor, beta-blocker.   2. Chronic combined systolic and diastolic CHF - Volume stable.  Continue  current Rx.   3. Ischemic Cardiomyopathy - EF 55% by echo in 5/16. Recent nuclear study and LHC with EF 35-45%.Continue beta-blocker. 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 ACE should be avoided. Will arrange Repeat Echo 90 days post CABG.  If EF < 35%, will need to refer to EP to consider ICD.    4. PAF -  Maintaining NSR. He remains on Pradaxa for anticoagulation. He has a hx of CVA.   5. HTN - Controlled.   6. Carotid stenosis, bilateral - s/p L CEA. FU with VVS.    7. HL - LDL in 12/16 was 47. Continue statin.    Medication Adjustments/Labs and Tests Ordered: Current medicines are reviewed at length with the patient today.  Concerns regarding medicines are outlined above.  Medication changes, Labs and Tests ordered today are outlined in the Patient Instructions noted below. Patient Instructions  Medication Instructions:  Your physician recommends that you continue on your current medications as directed. Please refer to the Current Medication list given to you today.  Labwork: NONE  Testing/Procedures: Your physician has requested that you have an echocardiogram. THIS IS TO  BE DONE AFTER 11/11/15. Echocardiography is a painless test that uses sound waves to create images of your heart. It provides your doctor with information about the size and shape of your heart and how well your heart's chambers and valves are working. This procedure takes approximately one hour. There are no restrictions for this procedure.  Follow-Up: 1. Tharon Kitch, PAC 3 MONTHS 2. DR. Acie Fredrickson 6 MONTHS; WE WILL SEND OUT A REMINDER LETTER A COUPLE OF MONTHS EARLIER TO CALL AND MAKE APPT   Any Other Special Instructions Will Be Listed Below (If Applicable).  If you need a refill on your cardiac medications before your next appointment, please call your pharmacy.   Signed, Richardson Dopp, PA-C  10/26/2015 9:29 PM    [facility] [place], [place], [place]   [address] Phone: [phone]; Fax: [phone]

## 2015-10-26 ENCOUNTER — Ambulatory Visit (INDEPENDENT_AMBULATORY_CARE_PROVIDER_SITE_OTHER): Payer: Medicare (Managed Care) | Admitting: Physician Assistant

## 2015-10-26 ENCOUNTER — Encounter: Payer: Self-pay | Admitting: Physician Assistant

## 2015-10-26 VITALS — BP 120/70 | HR 62 | Ht 62.0 in | Wt 201.0 lb

## 2015-10-26 DIAGNOSIS — I5042 Chronic combined systolic (congestive) and diastolic (congestive) heart failure: Secondary | ICD-10-CM

## 2015-10-26 DIAGNOSIS — I255 Ischemic cardiomyopathy: Secondary | ICD-10-CM | POA: Diagnosis not present

## 2015-10-26 DIAGNOSIS — I480 Paroxysmal atrial fibrillation: Secondary | ICD-10-CM

## 2015-10-26 DIAGNOSIS — E7800 Pure hypercholesterolemia, unspecified: Secondary | ICD-10-CM

## 2015-10-26 DIAGNOSIS — I2510 Atherosclerotic heart disease of native coronary artery without angina pectoris: Secondary | ICD-10-CM

## 2015-10-26 DIAGNOSIS — I6523 Occlusion and stenosis of bilateral carotid arteries: Secondary | ICD-10-CM

## 2015-10-26 DIAGNOSIS — I10 Essential (primary) hypertension: Secondary | ICD-10-CM

## 2015-10-26 NOTE — Patient Instructions (Addendum)
Medication Instructions:  Your physician recommends that you continue on your current medications as directed. Please refer to the Current Medication list given to you today.  Labwork: NONE  Testing/Procedures: Your physician has requested that you have an echocardiogram. THIS IS TO  BE DONE AFTER 11/11/15. Echocardiography is a painless test that uses sound waves to create images of your heart. It provides your doctor with information about the size and shape of your heart and how well your heart's chambers and valves are working. This procedure takes approximately one hour. There are no restrictions for this procedure.  Follow-Up: 1. SCOTT WEAVER, PAC 3 MONTHS 2. DR. Acie Fredrickson 6 MONTHS; WE WILL SEND OUT A REMINDER LETTER A COUPLE OF MONTHS EARLIER TO CALL AND MAKE APPT   Any Other Special Instructions Will Be Listed Below (If Applicable).  If you need a refill on your cardiac medications before your next appointment, please call your pharmacy.

## 2015-11-11 ENCOUNTER — Telehealth: Payer: Self-pay

## 2015-11-11 NOTE — Telephone Encounter (Signed)
RECEIVED PHONE CALL FROM [facility] FOR PT [name] 07/31/2016 FOR HIS AUTH# FOR UPCOMING 2-D ECHO AUTH# [id number]

## 2015-11-12 ENCOUNTER — Ambulatory Visit (HOSPITAL_COMMUNITY): Payer: Medicare (Managed Care) | Attending: Cardiology

## 2015-11-12 ENCOUNTER — Other Ambulatory Visit: Payer: Self-pay

## 2015-11-12 DIAGNOSIS — I110 Hypertensive heart disease with heart failure: Secondary | ICD-10-CM | POA: Insufficient documentation

## 2015-11-12 DIAGNOSIS — I272 Other secondary pulmonary hypertension: Secondary | ICD-10-CM | POA: Diagnosis not present

## 2015-11-12 DIAGNOSIS — I2510 Atherosclerotic heart disease of native coronary artery without angina pectoris: Secondary | ICD-10-CM | POA: Insufficient documentation

## 2015-11-12 DIAGNOSIS — I358 Other nonrheumatic aortic valve disorders: Secondary | ICD-10-CM | POA: Diagnosis not present

## 2015-11-12 DIAGNOSIS — I059 Rheumatic mitral valve disease, unspecified: Secondary | ICD-10-CM | POA: Diagnosis not present

## 2015-11-12 DIAGNOSIS — I509 Heart failure, unspecified: Secondary | ICD-10-CM | POA: Diagnosis not present

## 2015-11-12 DIAGNOSIS — I255 Ischemic cardiomyopathy: Secondary | ICD-10-CM | POA: Diagnosis present

## 2015-11-13 ENCOUNTER — Encounter: Payer: Self-pay | Admitting: Physician Assistant

## 2015-11-13 ENCOUNTER — Telehealth: Payer: Self-pay | Admitting: *Deleted

## 2015-11-13 NOTE — Telephone Encounter (Signed)
Pt notified of echo results by phone with verbal understanding 

## 2015-12-28 ENCOUNTER — Emergency Department (HOSPITAL_COMMUNITY)
Admission: EM | Admit: 2015-12-28 | Discharge: 2015-12-28 | Disposition: A | Discharge status: Home / Self Care | Payer: Medicare (Managed Care) | Attending: Emergency Medicine | Admitting: Emergency Medicine

## 2015-12-28 ENCOUNTER — Encounter (HOSPITAL_COMMUNITY): Payer: Self-pay | Admitting: *Deleted

## 2015-12-28 DIAGNOSIS — E669 Obesity, unspecified: Secondary | ICD-10-CM | POA: Diagnosis not present

## 2015-12-28 DIAGNOSIS — Z87891 Personal history of nicotine dependence: Secondary | ICD-10-CM | POA: Diagnosis not present

## 2015-12-28 DIAGNOSIS — Z7984 Long term (current) use of oral hypoglycemic drugs: Secondary | ICD-10-CM | POA: Diagnosis not present

## 2015-12-28 DIAGNOSIS — D649 Anemia, unspecified: Secondary | ICD-10-CM | POA: Diagnosis not present

## 2015-12-28 DIAGNOSIS — E785 Hyperlipidemia, unspecified: Secondary | ICD-10-CM | POA: Diagnosis not present

## 2015-12-28 DIAGNOSIS — G8929 Other chronic pain: Secondary | ICD-10-CM | POA: Insufficient documentation

## 2015-12-28 DIAGNOSIS — E11649 Type 2 diabetes mellitus with hypoglycemia without coma: Secondary | ICD-10-CM | POA: Insufficient documentation

## 2015-12-28 DIAGNOSIS — Z880 Allergy status to penicillin: Secondary | ICD-10-CM | POA: Insufficient documentation

## 2015-12-28 DIAGNOSIS — I5032 Chronic diastolic (congestive) heart failure: Secondary | ICD-10-CM | POA: Insufficient documentation

## 2015-12-28 DIAGNOSIS — Z79899 Other long term (current) drug therapy: Secondary | ICD-10-CM | POA: Diagnosis not present

## 2015-12-28 DIAGNOSIS — I252 Old myocardial infarction: Secondary | ICD-10-CM | POA: Insufficient documentation

## 2015-12-28 DIAGNOSIS — Z7952 Long term (current) use of systemic steroids: Secondary | ICD-10-CM | POA: Insufficient documentation

## 2015-12-28 DIAGNOSIS — R531 Weakness: Secondary | ICD-10-CM | POA: Insufficient documentation

## 2015-12-28 DIAGNOSIS — J449 Chronic obstructive pulmonary disease, unspecified: Secondary | ICD-10-CM | POA: Insufficient documentation

## 2015-12-28 DIAGNOSIS — Z8673 Personal history of transient ischemic attack (TIA), and cerebral infarction without residual deficits: Secondary | ICD-10-CM | POA: Diagnosis not present

## 2015-12-28 DIAGNOSIS — E162 Hypoglycemia, unspecified: Secondary | ICD-10-CM

## 2015-12-28 DIAGNOSIS — I10 Essential (primary) hypertension: Secondary | ICD-10-CM | POA: Diagnosis not present

## 2015-12-28 DIAGNOSIS — F419 Anxiety disorder, unspecified: Secondary | ICD-10-CM | POA: Insufficient documentation

## 2015-12-28 DIAGNOSIS — Z872 Personal history of diseases of the skin and subcutaneous tissue: Secondary | ICD-10-CM | POA: Diagnosis not present

## 2015-12-28 DIAGNOSIS — Z951 Presence of aortocoronary bypass graft: Secondary | ICD-10-CM | POA: Diagnosis not present

## 2015-12-28 DIAGNOSIS — Z7982 Long term (current) use of aspirin: Secondary | ICD-10-CM | POA: Insufficient documentation

## 2015-12-28 DIAGNOSIS — Z9981 Dependence on supplemental oxygen: Secondary | ICD-10-CM | POA: Diagnosis not present

## 2015-12-28 LAB — I-STAT CHEM 8, ED
BUN: 22 mg/dL — AB (ref 6–20)
CHLORIDE: 99 mmol/L — AB (ref 101–111)
CREATININE: 0.9 mg/dL (ref 0.61–1.24)
Calcium, Ion: 1.08 mmol/L — ABNORMAL LOW (ref 1.13–1.30)
Glucose, Bld: 224 mg/dL — ABNORMAL HIGH (ref 65–99)
HEMATOCRIT: 37 % — AB (ref 39.0–52.0)
Hemoglobin: 12.6 g/dL — ABNORMAL LOW (ref 13.0–17.0)
Potassium: 4.4 mmol/L (ref 3.5–5.1)
SODIUM: 141 mmol/L (ref 135–145)
TCO2: 26 mmol/L (ref 0–100)

## 2015-12-28 LAB — CBG MONITORING, ED
GLUCOSE-CAPILLARY: 86 mg/dL (ref 65–99)
Glucose-Capillary: 82 mg/dL (ref 65–99)
Glucose-Capillary: 196 mg/dL — ABNORMAL HIGH (ref 65–99)

## 2015-12-28 NOTE — ED Notes (Signed)
Pt arrives via ems. Pt glucose was low (in the 30's) before he went to bed last night but did not do anything about it. On ems arrival, pt was diaphoretic and disoriented, cbg 39, given 1/2 amp/ate pb sandwich, now recheck 112, mental status improved.

## 2015-12-28 NOTE — ED Notes (Signed)
CHECKED CBG 86 RN KELLY INFORMED

## 2015-12-28 NOTE — ED Provider Notes (Signed)
CSN: [id number]     Arrival date & time 12/28/15  0058 History   First MD Initiated Contact with Patient 12/28/15 0703     Chief Complaint  Patient presents with  . Hypoglycemia     (Consider location/radiation/quality/duration/timing/severity/associated sxs/prior Treatment) Patient is a 60 y.o. male presenting with hypoglycemia. The history is provided by the patient (Patient state that his blood sugar dropped today and he felt very weak).  Hypoglycemia Severity:  Moderate Onset quality:  Sudden Timing:  Constant Progression:  Improving Chronicity:  New Diabetic status:  Controlled with insulin Context: not decreased oral intake   Associated symptoms: weakness   Associated symptoms: no seizures     Past Medical History  Diagnosis Date  . Hypertension   . Intellectual disability     Sister helps to take care of him and takes him to appts  . Tobacco user     Smokes 1ppd for multiple years.  Quit after hosp 09/2010.  Marland Kitchen Anemia     History of Iron Def Anemia  . Anxiety   . Hyperlipidemia   . PAF (paroxysmal atrial fibrillation) ([place]) 06/2009    CHADS score 2 (HTN, DM), was not on coumadin, but now on Pradaxa for Afib  . Lung nodule   . Chronic low back pain   . CVA (cerebral infarction) 09/2010    Bilateral with Left > Right  . Abdominal hernia     Chronic, not a good surgical candidate  . Carotid artery occlusion   . Obesity   . COPD (chronic obstructive pulmonary disease) ([place])   . Abscess, abdomen ([place]) 12/31/2010    Referred to Wound Care in 01/2011 because of multiple abd abscess with VERY large ventral hernia (please look at image of CT abd/pelvis 09/2010).  Because of hernia I was hesitant to I&D.      Marland Kitchen GERD (gastroesophageal reflux disease)   . Pneumonia     hx of  . Chronic diastolic CHF (congestive heart failure) ([facility])     takes Lasix  . Itching     all over body; pt scratches and has sores on bilateral arms and abdomen  . Hx of echocardiogram     Echo 5/16:   Mild LVH, EF 55%, indeterm. diast function, WMA could not be ruled out, MAC, trivial MR, mild LAE, normal RVF //  b. Echo 4/17: EF 55-60%, no RWMA, Gr 2 DD, Ao sclerosis, MAC, mild MS, mild LAE, PASP 55 mHg  . Myocardial infarction [facility]) 2016 ?    Heart attack  (  Per  pt. )  . History of nuclear stress test     Myoview 9/16:  Inferior, apical and inf-lateral ischemia; not gated; High Risk  . Shortness of breath dyspnea     with exertion  . Diabetes mellitus   . Stroke ([place])   . Oxygen dependent     wears 2 liters at bedtime and when needed  . Headache   . Wears glasses    Past Surgical History  Procedure Laterality Date  . Carotid arteriogram  10/2010    30% right ICA stenosis, 40% left ICA stenosis   . Transesophageal echocardiogram  09/2010    No ASD or PFO. EF 60-65%.  Normal systolic function. No evidence of thrombus.   . Transthoracic echocardiogram  09/2010     The cavity size was normal. Systolic function was vigorous.  EF 65-70%.  Normal wall funciton.   . Debridment of decubitus ulcer Right 02/13/2013  .  I&d extremity Right 02/13/2013    Procedure: IRRIGATION AND DEBRIDEMENT FOOT ULCER;  Surgeon: Johnny Bridge, MD;  Location: [place];  Service: Orthopedics;  Laterality: Right;  PULSE LAVAGE  . Cataract extraction, bilateral    . Arm surgery Left     as a child  . Amputation Right 03/29/2013    Procedure: AMPUTATION RAY;  Surgeon: Newt Minion, MD;  Location: [place];  Service: Orthopedics;  Laterality: Right;  Right Foot 3rd and Possible 4th Ray Amputation  . Multiple tooth extractions    . Amputation Right 04/23/2013    Procedure: AMPUTATION RIGHT MID-FOOT;  Surgeon: Newt Minion, MD;  Location: [place];  Service: Orthopedics;  Laterality: Right;  . Esophagogastroduodenoscopy (egd) with propofol N/A 06/10/2014    Procedure: ESOPHAGOGASTRODUODENOSCOPY (EGD) WITH PROPOFOL;  Surgeon: Jerene Bears, MD;  Location: [facility];  Service: Gastroenterology;  Laterality: N/A;  .  Colonoscopy with propofol N/A 06/10/2014    Procedure: COLONOSCOPY WITH PROPOFOL;  Surgeon: Jerene Bears, MD;  Location: [facility];  Service: Gastroenterology;  Laterality: N/A;  . Givens capsule study N/A 07/09/2014    Procedure: GIVENS CAPSULE STUDY;  Surgeon: Jerene Bears, MD;  Location: [facility];  Service: Gastroenterology;  Laterality: N/A;  . Cardiac catheterization N/A 04/30/2015    Procedure: Left Heart Cath and Coronary Angiography;  Surgeon: Belva Crome, MD;  Location: [facility] [place] [facility];  Service: Cardiovascular;  Laterality: N/A;  . Coronary artery bypass graft N/A 08/13/2015    Procedure: CORONARY ARTERY BYPASS GRAFTING (CABG), ON PUMP, TIMES THREE, USING LEFT INTERNAL MAMMARY ARTERY, RIGHT GREATER SAPHENOUS VEIN HARVESTED ENDOSCOPICALLY;  Surgeon: [name], MD;  Location: [place] [facility];  Service: Open Heart Surgery;  Laterality: N/A;  . Tee without cardioversion N/A 08/13/2015    Procedure: TRANSESOPHAGEAL ECHOCARDIOGRAM (TEE);  Surgeon: [name], MD;  Location: [place];  Service: Open Heart Surgery;  Laterality: N/A;  . Endarterectomy Left 08/13/2015    Procedure: ENDARTERECTOMY CAROTID;  Surgeon: Angelia Mould, MD;  [facility];  Service: Vascular;  Laterality: Left;  . Carotid endarterectomy Left 08-13-15    CEA   Family History  Problem Relation Age of Onset  . Heart disease Mother   . Hypertension Sister   . Heart disease Brother   . Heart disease Father   . Peripheral vascular disease Father   . Heart disease Maternal Grandmother   . Heart attack Maternal Grandmother   . Breast cancer Maternal Grandmother   . Colon cancer Neg Hx   . Stroke Neg Hx   . Stomach cancer Maternal Uncle    Social History  Substance Use Topics  . Smoking status: Former Smoker -- 1.00 packs/day for 42 years    Types: Cigarettes, Pipe    Quit date: 11/10/2010  . Smokeless tobacco: Former Systems developer    Quit date: 10/02/2010  . Alcohol Use: No     Comment: " last drink was 2003"  08/11/15    Review of Systems  Constitutional: Negative for appetite change and fatigue.  HENT: Negative for congestion, ear discharge and sinus pressure.   Eyes: Negative for discharge.  Respiratory: Negative for cough.   Cardiovascular: Negative for chest pain.  Gastrointestinal: Negative for abdominal pain and diarrhea.  Genitourinary: Negative for frequency and hematuria.  Musculoskeletal: Negative for back pain.  Skin: Negative for rash.  Neurological: Positive for weakness. Negative for seizures and headaches.  Psychiatric/Behavioral: Negative for hallucinations.      Allergies  Penicillins  Home Medications   Prior to Admission medications   Medication Sig Start Date End Date Taking? Authorizing Provider  acetaminophen (TYLENOL) 325 MG tablet Take 2 tablets (650 mg total) by mouth every 6 (six) hours as needed for mild pain. 08/21/15  Yes Donielle Liston Alba, PA-C  aspirin EC 81 MG EC tablet Take 1 tablet (81 mg total) by mouth daily. 11/11/12  Yes Kandis Nab, MD  atorvastatin (LIPITOR) 80 MG tablet Take 1 tablet (80 mg total) by mouth daily at 6 PM. 11/03/14  Yes Janece Canterbury, MD  Cholecalciferol (VITAMIN D3) 1000 UNITS CAPS Take 1,000 Units by mouth daily.   Yes Historical Provider, MD  clonazePAM (KLONOPIN) 0.5 MG tablet Take 1 tablet (0.5 mg total) by mouth at bedtime. 11/03/14  Yes Janece Canterbury, MD  dabigatran (PRADAXA) 150 MG CAPS capsule Take 150 mg by mouth 2 (two) times daily.   Yes Historical Provider, MD  digoxin (LANOXIN) 0.125 MG tablet Take 1 tablet (0.125 mg total) by mouth daily. 08/21/15  Yes Donielle Liston Alba, PA-C  ferrous gluconate (FERGON) 324 MG tablet Take 1 tablet (324 mg total) by mouth daily with breakfast. 08/21/15  Yes Donielle Liston Alba, PA-C  furosemide (LASIX) 20 MG tablet Take 1 tablet (20 mg total) by mouth daily. 09/04/15  Yes Scott Joylene Draft, PA-C  gabapentin (NEURONTIN) 300 MG capsule Take 1 capsule (300 mg total) by mouth 2 (two)  times daily. 11/03/14  Yes Janece Canterbury, MD  HYDROcodone-acetaminophen (NORCO/VICODIN) 5-325 MG tablet Take 1 tablet by mouth every 6 (six) hours as needed for moderate pain. 08/21/15  Yes Donielle Liston Alba, PA-C  hydrocortisone 2.5 % ointment Apply topically 3 (three) times daily. To left leg around wound. Do not apply to broken skin. 08/21/15  Yes Donielle Liston Alba, PA-C  insulin aspart (NOVOLOG) 100 UNIT/ML injection Inject 0-20 Units into the skin 3 (three) times daily with meals. Units of Insulin CBG 70-120=0       CBG 251-300=11 CBG 121-150=3     CBG 301-350=15 CBG 151-200=4     CBG 351-400=20 CBG 201-250=7     CBG>400, call MD 08/21/15  Yes Donielle Liston Alba, PA-C  metFORMIN (GLUCOPHAGE) 1000 MG tablet Take 1,000 mg by mouth 2 (two) times daily with a meal.   Yes Historical Provider, MD  metoprolol tartrate (LOPRESSOR) 25 MG tablet Take 1 tablet (25 mg total) by mouth 3 (three) times daily. 08/21/15  Yes Donielle Liston Alba, PA-C  mupirocin cream (BACTROBAN) 2 % Apply 1 application topically 2 (two) times daily. FOR SKIN TEARS 09/04/15  Yes Scott Joylene Draft, PA-C  omeprazole (PRILOSEC) 20 MG capsule Take 20 mg by mouth daily.   Yes Historical Provider, MD  PARoxetine (PAXIL) 40 MG tablet Take 40 mg by mouth every morning.   Yes Historical Provider, MD  saccharomyces boulardii (FLORASTOR) 250 MG capsule Take 1 capsule (250 mg total) by mouth 2 (two) times daily. 11/03/14  Yes Janece Canterbury, MD  levofloxacin (LEVAQUIN) 500 MG tablet Take 1 tablet (500 mg total) by mouth daily. Patient not taking: Reported on 12/28/2015 09/04/15   Liliane Shi, PA-C   BP 175/72 mmHg  Pulse 60  Resp 18  SpO2 98% Physical Exam  Constitutional: He is oriented to person, place, and time. He appears well-developed.  HENT:  Head: Normocephalic.  Eyes: Conjunctivae and EOM are normal. No scleral icterus.  Neck: Neck supple. No thyromegaly present.  Cardiovascular: Normal rate and regular rhythm.  Exam  reveals no gallop and no friction rub.   No murmur heard. Pulmonary/Chest: No stridor. He has no wheezes. He has no rales. He exhibits no tenderness.  Abdominal: He exhibits no distension. There is no tenderness. There is no rebound.  Musculoskeletal: Normal range of motion. He exhibits no edema.  Lymphadenopathy:    He has no cervical adenopathy.  Neurological: He is oriented to person, place, and time. He exhibits normal muscle tone. Coordination normal.  Skin: No rash noted. No erythema.  Psychiatric: He has a normal mood and affect. His behavior is normal.    ED Course  Procedures (including critical care time) Labs Review Labs Reviewed  CBG MONITORING, ED - Abnormal; Notable for the following:    Glucose-Capillary 196 (*)    All other components within normal limits  I-STAT CHEM 8, ED - Abnormal; Notable for the following:    Chloride 99 (*)    BUN 22 (*)    Glucose, Bld 224 (*)    Calcium, Ion 1.08 (*)    Hemoglobin 12.6 (*)    HCT 37.0 (*)    All other components within normal limits  CBG MONITORING, ED  CBG MONITORING, ED    Imaging Review No results found. I have personally reviewed and evaluated these images and lab results as part of my medical decision-making.   EKG Interpretation None      MDM   Final diagnoses:  Hypoglycemia    Patient with hypoglycemia that has resolved now. He is instructed to decrease his insulin from 50 units at night to 40 units. He will see his doctor this week    Milton Ferguson, MD 12/28/15 [phone]

## 2015-12-28 NOTE — ED Notes (Signed)
Unable to get pt temp at this time.

## 2015-12-28 NOTE — Discharge Instructions (Signed)
Decrease insulin to 40 units at night and follow up with your md this week.

## 2015-12-28 NOTE — ED Notes (Signed)
Pt given sandwich and drink.

## 2016-01-27 NOTE — Progress Notes (Signed)
Cardiology Office Note:    Date:  01/28/2016   ID:  [name] [name], DOB 09/29/1955, MRN [id number]  PCP:  [name] Maroon, MD  Cardiologist: Dr. Liam [name] /  [name] Dopp, PA-C  Electrophysiologist: N/a VVS: Dr. Scot [name] TCTS: Dr. Cyndia [name]  Referring MD: [name] Adie, MD   Chief Complaint  Patient presents with  . Follow-up    CAD, CHF    History of Present Illness:     [name] [name] is a 60 y.o. male with a hx of CAD, combined systolic and diastolic CHF, ischemic CM, PAF, HTN, diabetes, HL, RTA Type 4 with hyperkalemia, prior stroke, carotid stenosis, COPD, intellectual disability, diabetic ulcer s/p R midfoot amputation 2014.   Admitted 3/16 with NSTEMI in the setting of sepsis syndrome c/b acute hypoxic respiratory failure secondary to a/c diastolic CHF, AECOPD and AF with RVR. Given his multiple comorbid conditions [name] was treated conservatively.  In 8/16, [name] was noted to have progressive carotid artery stenosis on the left. A nuclear stress test was high risk. LHC in 9/16 demonstrated 3v CAD and CABG + CEA was recommended.[name] [name] ultimately underwent CABG with L-LAD, S-RI, S-OM2 + L CEA on 08/13/15. [name] [name] had several episodes of AF postoperatively. [name] [name] was placed on amiodarone. But, the amiodarone was stopped 2/2 to bradycardia.   Last seen by me in 3/17.  Echo in 4/17 demonstrated improved LVF with EF [id number], mod diastolic dysfunction and mod pulmonary HTN.    Returns [name] FU.  Here alone.  [name] is still followed at [name].  [name] [name] is overall doing well.  [name] [name] sleeps with O2 and notes occ episodes of shortness of breath at night.  [name] [name] denies significant edema.  [name] [name] denies syncope. [name] [name] has occ chest pain.  This does not seem to be related to exertion. She has occ hemorrhoidal bleeding.  But, [name] [name] denies melena, hematochezia, hematuria.  [name] [name] went to the ED last month with hypoglycemia.  Creatinine and Hgb were stable at that time.     Past Medical History  Diagnosis Date  .  Hypertension   . Intellectual disability     Sister helps to take care of him and takes him to appts  . Tobacco user     Smokes 1ppd [name] multiple years.  Quit after hosp 09/2010.  Marland Kitchen Anemia     History of Iron Def Anemia  . Anxiety   . Hyperlipidemia   . PAF (paroxysmal atrial fibrillation) ([name]) 06/2009    CHADS score 2 (HTN, DM), was not on coumadin, but now on Pradaxa [name] Afib  . Lung nodule   . Chronic low back pain   . CVA (cerebral infarction) 09/2010    Bilateral with Left > Right  . Abdominal hernia     Chronic, not a good surgical candidate  . Carotid artery occlusion   . Obesity   . COPD (chronic obstructive pulmonary disease) ([name])   . Abscess, abdomen ([name]) 12/31/2010    Referred to Wound Care in 01/2011 because of multiple abd abscess with VERY large ventral hernia (please look at image of CT abd/pelvis 09/2010).  Because of hernia I was hesitant to I&D.      Marland Kitchen GERD (gastroesophageal reflux disease)   . Pneumonia     hx of  . Chronic diastolic CHF (congestive heart failure) (HCC)     takes Lasix  . Itching     all over body; pt scratches and has sores on bilateral arms and abdomen  . Hx of  echocardiogram     Echo 5/16:  Mild LVH, EF 55%, indeterm. diast function, WMA could not be ruled out, MAC, trivial MR, mild LAE, normal RVF //  b. Echo 4/17: EF 55-60%, no RWMA, Gr 2 DD, Ao sclerosis, MAC, mild MS, mild LAE, PASP 55 mHg  . Myocardial infarction [name]) 2016 ?    Heart attack  (  Per  pt. )  . History of nuclear stress test     Myoview 9/16:  Inferior, apical and inf-lateral ischemia; not gated; High Risk  . Shortness of breath dyspnea     with exertion  . Diabetes mellitus   . Oxygen dependent     wears 2 liters at bedtime and when needed  . Wears glasses     Past Surgical History  Procedure Laterality Date  . Carotid arteriogram  10/2010    30% right ICA stenosis, 40% left ICA stenosis   . Transesophageal echocardiogram  09/2010    No ASD or PFO. EF 60-65%.   Normal systolic function. No evidence of thrombus.   . Transthoracic echocardiogram  09/2010     The cavity size was normal. Systolic function was vigorous.  EF 65-70%.  Normal wall funciton.   . Debridment of decubitus ulcer Right 02/13/2013  . I&d extremity Right 02/13/2013    Procedure: IRRIGATION AND DEBRIDEMENT FOOT ULCER;  Surgeon: Joseph Bridge, MD;  Location: [name];  Service: Orthopedics;  Laterality: Right;  PULSE LAVAGE  . Cataract extraction, bilateral    . Arm surgery Left     as a child  . Amputation Right 03/29/2013    Procedure: AMPUTATION RAY;  Surgeon: Joseph Minion, MD;  Location: [name];  Service: Orthopedics;  Laterality: Right;  Right Foot 3rd and Possible 4th Ray Amputation  . Multiple tooth extractions    . Amputation Right 04/23/2013    Procedure: AMPUTATION RIGHT MID-FOOT;  Surgeon: Joseph Minion, MD;  Location: [name];  Service: Orthopedics;  Laterality: Right;  . Esophagogastroduodenoscopy (egd) with propofol N/A 06/10/2014    Procedure: ESOPHAGOGASTRODUODENOSCOPY (EGD) WITH PROPOFOL;  Surgeon: Jerene Bears, MD;  Location: [name];  Service: Gastroenterology;  Laterality: N/A;  . Colonoscopy with propofol N/A 06/10/2014    Procedure: COLONOSCOPY WITH PROPOFOL;  Surgeon: Jerene Bears, MD;  Location: [name];  Service: Gastroenterology;  Laterality: N/A;  . Givens capsule study N/A 07/09/2014    Procedure: GIVENS CAPSULE STUDY;  Surgeon: Jerene Bears, MD;  Location: [name];  Service: Gastroenterology;  Laterality: N/A;  . Cardiac catheterization N/A 04/30/2015    Procedure: Left Heart Cath and Coronary Angiography;  Surgeon: Belva Crome, MD;  Location: [name];  Service: Cardiovascular;  Laterality: N/A;  . Coronary artery bypass graft N/A 08/13/2015    Procedure: CORONARY ARTERY BYPASS GRAFTING (CABG), ON PUMP, TIMES THREE, USING LEFT INTERNAL MAMMARY ARTERY, RIGHT GREATER SAPHENOUS VEIN HARVESTED ENDOSCOPICALLY;  Surgeon: [name], MD;   Location: [facility];  Service: Open Heart Surgery;  Laterality: N/A;  . Tee without cardioversion N/A 08/13/2015    Procedure: TRANSESOPHAGEAL ECHOCARDIOGRAM (TEE);  Surgeon: [name], MD;  Location: [facility];  Service: Open Heart Surgery;  Laterality: N/A;  . Endarterectomy Left 08/13/2015    Procedure: ENDARTERECTOMY CAROTID;  Surgeon: Angelia Mould, MD;  Location: [facility];  Service: Vascular;  Laterality: Left;  . Carotid endarterectomy Left 08-13-15    CEA    Current Medications: Outpatient Prescriptions Prior to Visit  Medication  Sig Dispense Refill  . acetaminophen (TYLENOL) 325 MG tablet Take 2 tablets (650 mg total) by mouth every 6 (six) hours as needed [name] mild pain.    Marland Kitchen aspirin EC 81 MG EC tablet Take 1 tablet (81 mg total) by mouth daily. 90 tablet 3  . atorvastatin (LIPITOR) 80 MG tablet Take 1 tablet (80 mg total) by mouth daily at 6 PM. 45 tablet 0  . Cholecalciferol (VITAMIN D3) 1000 UNITS CAPS Take 1,000 Units by mouth daily.    . clonazePAM (KLONOPIN) 0.5 MG tablet Take 1 tablet (0.5 mg total) by mouth at bedtime. 30 tablet 0  . dabigatran (PRADAXA) 150 MG CAPS capsule Take 150 mg by mouth 2 (two) times daily.    . ferrous gluconate (FERGON) 324 MG tablet Take 1 tablet (324 mg total) by mouth daily with breakfast. 60 tablet 0  . gabapentin (NEURONTIN) 300 MG capsule Take 1 capsule (300 mg total) by mouth 2 (two) times daily. 60 capsule 0  . HYDROcodone-acetaminophen (NORCO/VICODIN) 5-325 MG tablet Take 1 tablet by mouth every 6 (six) hours as needed [name] moderate pain. 30 tablet 0  . metFORMIN (GLUCOPHAGE) 1000 MG tablet Take 1,000 mg by mouth 2 (two) times daily with a meal.    . omeprazole (PRILOSEC) 20 MG capsule Take 20 mg by mouth daily.    Marland Kitchen PARoxetine (PAXIL) 40 MG tablet Take 40 mg by mouth every morning.    . digoxin (LANOXIN) 0.125 MG tablet Take 1 tablet (0.125 mg total) by mouth daily. (Patient not taking: Reported on 01/28/2016)    . furosemide (LASIX) 20 MG  tablet Take 1 tablet (20 mg total) by mouth daily. (Patient not taking: Reported on 01/28/2016) 30 tablet 3  . hydrocortisone 2.5 % ointment Apply topically 3 (three) times daily. To left leg around wound. Do not apply to broken skin. (Patient not taking: Reported on 01/28/2016) 30 g 0  . insulin aspart (NOVOLOG) 100 UNIT/ML injection Inject 0-20 Units into the skin 3 (three) times daily with meals. Units of Insulin CBG 70-120=0       CBG 251-300=11 CBG 121-150=3     CBG 301-350=15 CBG 151-200=4     CBG 351-400=20 CBG 201-250=7     CBG>400, call MD (Patient not taking: Reported on 01/28/2016) 10 mL 11  . levofloxacin (LEVAQUIN) 500 MG tablet Take 1 tablet (500 mg total) by mouth daily. (Patient not taking: Reported on 12/28/2015) 30 tablet 0  . metoprolol tartrate (LOPRESSOR) 25 MG tablet Take 1 tablet (25 mg total) by mouth 3 (three) times daily. (Patient not taking: Reported on 01/28/2016)    . mupirocin cream (BACTROBAN) 2 % Apply 1 application topically 2 (two) times daily. [name] SKIN TEARS (Patient not taking: Reported on 01/28/2016) 15 g 0  . saccharomyces boulardii (FLORASTOR) 250 MG capsule Take 1 capsule (250 mg total) by mouth 2 (two) times daily. (Patient not taking: Reported on 01/28/2016) 60 capsule 2   No facility-administered medications prior to visit.      Allergies:   Penicillins   Social History   Social History  . Marital Status: Single    Spouse Name: N/A  . Number of Children: 0  . Years of Education: N/A   Occupational History  .     Social History Main Topics  . Smoking status: Former Smoker -- 1.00 packs/day [name] 42 years    Types: Cigarettes, Pipe    Quit date: 11/10/2010  . Smokeless tobacco: Former [employer]  date: 10/02/2010  . Alcohol Use: No     Comment: " last drink was 2003" 08/11/15  . Drug Use: No  . Sexual Activity: Not Asked   Other Topics Concern  . None   Social History Narrative   Released from prison in 2003 after serving 2 years [name] indecency  with a minor.    [name] [name] smokes cigarettes greater than 30 years, also smoked a pipe.  Quit tobacco 09/2010.   Denies EtOH and drugs.      Family History:  The patient's family history includes Breast cancer in his maternal grandmother; Heart attack in his maternal grandmother; Heart disease in his brother, father, maternal grandmother, and mother; Hypertension in his sister; Peripheral vascular disease in his father; Stomach cancer in his maternal uncle. There is no history of Colon cancer or Stroke.   ROS:   Please see the history of present illness.    ROS All other systems reviewed and are negative.   Physical Exam:    VS:  BP 140/60 mmHg  Pulse 56  Ht 5\' 2"  (1.575 m)  Wt 187 lb 1.9 oz (84.877 kg)  BMI 34.22 kg/m2   Physical Exam  Constitutional: [name] [name] is oriented to person, place, and time. [name] appears well-developed and well-nourished.  Walks with 4 point cane  HENT:  Head: Normocephalic.  Neck:  No JVD at 90 degrees  Cardiovascular: Regular rhythm, S1 normal and S2 normal.   Murmur heard.  Harsh early systolic murmur is present with a grade of 2/6  at the upper right sternal border Pulmonary/Chest: [name] has no wheezes. [name] has no rales.  Decreased breath sounds bilaterally   Abdominal: There is no tenderness.  Large ventral hernia noted  Musculoskeletal:  Trace tight bilat LE edema  Neurological: [name] [name] is alert and oriented to person, place, and time.  Skin: Skin is warm and dry.  Psychiatric: [name] has a normal mood and affect.    Wt Readings from Last 3 Encounters:  01/28/16 187 lb 1.9 oz (84.877 kg)  10/26/15 201 lb (91.173 kg)  09/16/15 212 lb (96.163 kg)      Studies/Labs Reviewed:     EKG:  EKG is  ordered today.  The ekg ordered today demonstrates sinus brady,HR 55, RBBB, QTc 325 ms, no change from prior tracing.   Recent Labs: 05/19/2015: B Natriuretic Peptide 50.5 08/11/2015: ALT 11* 08/14/2015: Magnesium 2.2 08/21/2015: Platelets 341 12/28/2015: BUN 22*; Creatinine,  Ser 0.90; Hemoglobin 12.6*; Potassium 4.4; Sodium 141   Recent Lipid Panel    Component Value Date/Time   CHOL 99* 07/14/2015 1149   TRIG 156* 07/14/2015 1149   HDL 21* 07/14/2015 1149   CHOLHDL 4.7 07/14/2015 1149   VLDL 31* 07/14/2015 1149   LDLCALC 47 07/14/2015 1149   LDLDIRECT 54 08/23/2012 1447    Additional studies/ records that were reviewed today include:   Echo 11/12/15 - Left ventricle: The cavity size was normal. Systolic function was  normal. The estimated ejection fraction was in the range of 55%  to 60%. Wall motion was normal; there were no regional wall  motion abnormalities. Features are consistent with a pseudonormal  left ventricular filling pattern, with concomitant abnormal  relaxation and increased filling pressure (grade 2 diastolic  dysfunction). Doppler parameters are consistent with high  ventricular filling pressure. - Aortic valve: Moderate diffuse thickening and calcification,  consistent with sclerosis. - Mitral valve: Calcified annulus. Mildly thickened leaflets . The  findings are consistent with mild stenosis. - Left atrium:  The atrium was mildly dilated. - Pulmonary arteries: PA peak pressure: 55 mm Hg (S). Impressions:   Normal LVF EF 55-60%. Moderate AV sclerosis no stenosis. Mildly dilated LA. Mild MS. Moderate pulmonary HTN. The right ventricular systolic pressure was increased consistent with moderate pulmonary hypertension.  LHC 04/30/15 LAD: Ostial and mid 95%, distal 40%, ostial D1 100%, ostial D2 85% RI: Ostial 65% LCx: Proximal to mid 70%, mid 65%, OM2 90% RCA: Ostial 80%, proximal to distal 100%, distal 100%, RPDA 85%, RPAVB 100% EF 35-45%  Myoview 04/23/15 Large, mostly reversible inferior, apical and inferolateral reversible perfusion defect suggestive of ischemia (SDS 10, Extent 37%). This study was not-gated due to atrial fibrillation. This is a high-risk study based on the size and severity of the perfusion  defect.  Carotid US 8/16 R 60-79% L 80-99%  Echo 12/18/14 Mild LVH, EF 55%, wall motion abnormalities could not be excluded, MAC, trivial MR, mild LAE  Echo 10/16/12 Mild LVH, vigorous LVF, EF 65-70%, normal wall motion, grade 1 diastolic dysfunction  Nuclear study 09/06/12 Myoview scan with small apical defect Seen in horizontal images only. Cannot exclude tiny region of apical ischemia vs shift soft tissue. Otherwise normal perfusion. Overall low risk scan. LV Ejection Fraction: 69%. LV Wall Motion: NL LV Function; NL Wall Motion  ASSESSMENT:     1. Coronary artery disease involving native coronary artery of native heart without angina pectoris   2. Chronic combined systolic and diastolic CHF (congestive heart failure) ([place])   3. Ischemic cardiomyopathy   4. PAF (paroxysmal atrial fibrillation) ([place])   5. Essential hypertension   6. Carotid stenosis, bilateral   7. HYPERCHOLESTEROLEMIA     PLAN:     In order of problems listed above:  1. CAD - History of non-STEMI in 3/16 while hospitalized [name] acute, multiple, medical illnesses. Now s/p CABG + L CEA. [name] is overall stable.  [name] [name] complains of occ chest pain. However, [name] does not seem to be having obvious angina. Continue ASA, Lipitor, beta-blocker.   2. Chronic combined systolic and diastolic CHF - Volume stable. Continue current Rx. Recent Creatinine 0.90.  3. Ischemic Cardiomyopathy - EF 55% by echo in 5/16. Prior to CABG, nuclear study and LHC with EF 35-45%.Now, EF is back to normal by echo in 4/17.  Continue beta-blocker. [name] [name] has a hx of hyperkalemia 2/2 RTA Type 4. ACE should be avoided.   4. PAF - Maintaining NSR. [name] [name] remains on Pradaxa [name] anticoagulation. [name] has a hx of CVA. His HR is s/w slow, but [name] is asymptomatic.  Continue current dose of beta blocker.   5. HTN - Borderline control.  Continue to monitor.   6. Carotid stenosis, bilateral - s/p L CEA. FU with VVS as planned.   7. HL - Continue  statin.  LDL was optimal in 12/16.   Medication Adjustments/Labs and Tests Ordered: Current medicines are reviewed at length with the patient today.  Concerns regarding medicines are outlined above.  Medication changes, Labs and Tests ordered today are outlined in the Patient Instructions noted below. Patient Instructions  Medication Instructions:  No changes.  See your medication list. Labwork: None today. Testing/Procedures: None  Follow-Up: [name] Dopp, PA-C ON 06/02/16 @ 11:15.  Any Other Special Instructions Will Be Listed Below (If Applicable). If you need a refill on your cardiac medications before your next appointment, please call your pharmacy.    Signed, [name] Dopp, PA-C  01/28/2016 11:56 AM    [facility]  [address] [place], [place]  [address] Phone: [phone]; Fax: [phone]

## 2016-01-28 ENCOUNTER — Encounter: Payer: Self-pay | Admitting: Physician Assistant

## 2016-01-28 ENCOUNTER — Ambulatory Visit (INDEPENDENT_AMBULATORY_CARE_PROVIDER_SITE_OTHER): Payer: Medicare (Managed Care) | Admitting: Physician Assistant

## 2016-01-28 VITALS — BP 140/60 | HR 56 | Ht 62.0 in | Wt 187.1 lb

## 2016-01-28 DIAGNOSIS — I5042 Chronic combined systolic (congestive) and diastolic (congestive) heart failure: Secondary | ICD-10-CM

## 2016-01-28 DIAGNOSIS — I480 Paroxysmal atrial fibrillation: Secondary | ICD-10-CM | POA: Diagnosis not present

## 2016-01-28 DIAGNOSIS — I2510 Atherosclerotic heart disease of native coronary artery without angina pectoris: Secondary | ICD-10-CM

## 2016-01-28 DIAGNOSIS — I6523 Occlusion and stenosis of bilateral carotid arteries: Secondary | ICD-10-CM

## 2016-01-28 DIAGNOSIS — I255 Ischemic cardiomyopathy: Secondary | ICD-10-CM

## 2016-01-28 DIAGNOSIS — I10 Essential (primary) hypertension: Secondary | ICD-10-CM

## 2016-01-28 DIAGNOSIS — E7800 Pure hypercholesterolemia, unspecified: Secondary | ICD-10-CM

## 2016-01-28 NOTE — Patient Instructions (Addendum)
Medication Instructions:  No changes.  See your medication list. Labwork: None today. Testing/Procedures: None  Follow-Up: Richardson Dopp, PA-C ON 06/02/16 @ 11:15.  Any Other Special Instructions Will Be Listed Below (If Applicable). If you need a refill on your cardiac medications before your next appointment, please call your pharmacy.

## 2016-02-13 ENCOUNTER — Encounter (HOSPITAL_COMMUNITY): Payer: Self-pay | Admitting: Emergency Medicine

## 2016-02-13 ENCOUNTER — Inpatient Hospital Stay (HOSPITAL_COMMUNITY)
Admission: EM | Admit: 2016-02-13 | Discharge: 2016-02-19 | DRG: 871 | Disposition: A | Discharge status: Home / Self Care | Payer: Medicare (Managed Care) | Attending: Internal Medicine | Admitting: Internal Medicine

## 2016-02-13 ENCOUNTER — Emergency Department (HOSPITAL_COMMUNITY): Payer: Medicare (Managed Care)

## 2016-02-13 DIAGNOSIS — Z87891 Personal history of nicotine dependence: Secondary | ICD-10-CM | POA: Diagnosis not present

## 2016-02-13 DIAGNOSIS — F411 Generalized anxiety disorder: Secondary | ICD-10-CM | POA: Diagnosis present

## 2016-02-13 DIAGNOSIS — Z9842 Cataract extraction status, left eye: Secondary | ICD-10-CM

## 2016-02-13 DIAGNOSIS — I248 Other forms of acute ischemic heart disease: Secondary | ICD-10-CM | POA: Diagnosis present

## 2016-02-13 DIAGNOSIS — Z794 Long term (current) use of insulin: Secondary | ICD-10-CM | POA: Diagnosis not present

## 2016-02-13 DIAGNOSIS — E872 Acidosis, unspecified: Secondary | ICD-10-CM

## 2016-02-13 DIAGNOSIS — E875 Hyperkalemia: Secondary | ICD-10-CM | POA: Diagnosis not present

## 2016-02-13 DIAGNOSIS — K219 Gastro-esophageal reflux disease without esophagitis: Secondary | ICD-10-CM | POA: Diagnosis present

## 2016-02-13 DIAGNOSIS — J440 Chronic obstructive pulmonary disease with acute lower respiratory infection: Secondary | ICD-10-CM | POA: Diagnosis present

## 2016-02-13 DIAGNOSIS — Z951 Presence of aortocoronary bypass graft: Secondary | ICD-10-CM

## 2016-02-13 DIAGNOSIS — I2510 Atherosclerotic heart disease of native coronary artery without angina pectoris: Secondary | ICD-10-CM | POA: Diagnosis not present

## 2016-02-13 DIAGNOSIS — R778 Other specified abnormalities of plasma proteins: Secondary | ICD-10-CM | POA: Diagnosis present

## 2016-02-13 DIAGNOSIS — Z23 Encounter for immunization: Secondary | ICD-10-CM | POA: Diagnosis not present

## 2016-02-13 DIAGNOSIS — I5042 Chronic combined systolic (congestive) and diastolic (congestive) heart failure: Secondary | ICD-10-CM | POA: Diagnosis not present

## 2016-02-13 DIAGNOSIS — Z7982 Long term (current) use of aspirin: Secondary | ICD-10-CM

## 2016-02-13 DIAGNOSIS — J9621 Acute and chronic respiratory failure with hypoxia: Secondary | ICD-10-CM | POA: Diagnosis not present

## 2016-02-13 DIAGNOSIS — D509 Iron deficiency anemia, unspecified: Secondary | ICD-10-CM | POA: Diagnosis present

## 2016-02-13 DIAGNOSIS — Z7901 Long term (current) use of anticoagulants: Secondary | ICD-10-CM | POA: Diagnosis not present

## 2016-02-13 DIAGNOSIS — F79 Unspecified intellectual disabilities: Secondary | ICD-10-CM | POA: Diagnosis not present

## 2016-02-13 DIAGNOSIS — R6521 Severe sepsis with septic shock: Secondary | ICD-10-CM | POA: Diagnosis present

## 2016-02-13 DIAGNOSIS — F419 Anxiety disorder, unspecified: Secondary | ICD-10-CM | POA: Diagnosis present

## 2016-02-13 DIAGNOSIS — F329 Major depressive disorder, single episode, unspecified: Secondary | ICD-10-CM | POA: Diagnosis present

## 2016-02-13 DIAGNOSIS — Z8673 Personal history of transient ischemic attack (TIA), and cerebral infarction without residual deficits: Secondary | ICD-10-CM

## 2016-02-13 DIAGNOSIS — I480 Paroxysmal atrial fibrillation: Secondary | ICD-10-CM | POA: Diagnosis not present

## 2016-02-13 DIAGNOSIS — Z9841 Cataract extraction status, right eye: Secondary | ICD-10-CM

## 2016-02-13 DIAGNOSIS — E785 Hyperlipidemia, unspecified: Secondary | ICD-10-CM | POA: Diagnosis present

## 2016-02-13 DIAGNOSIS — R079 Chest pain, unspecified: Secondary | ICD-10-CM | POA: Diagnosis not present

## 2016-02-13 DIAGNOSIS — K439 Ventral hernia without obstruction or gangrene: Secondary | ICD-10-CM | POA: Diagnosis not present

## 2016-02-13 DIAGNOSIS — E2740 Unspecified adrenocortical insufficiency: Secondary | ICD-10-CM | POA: Diagnosis present

## 2016-02-13 DIAGNOSIS — I110 Hypertensive heart disease with heart failure: Secondary | ICD-10-CM | POA: Diagnosis not present

## 2016-02-13 DIAGNOSIS — A419 Sepsis, unspecified organism: Secondary | ICD-10-CM | POA: Diagnosis present

## 2016-02-13 DIAGNOSIS — E119 Type 2 diabetes mellitus without complications: Secondary | ICD-10-CM | POA: Diagnosis present

## 2016-02-13 DIAGNOSIS — Z7984 Long term (current) use of oral hypoglycemic drugs: Secondary | ICD-10-CM | POA: Diagnosis not present

## 2016-02-13 DIAGNOSIS — Z79899 Other long term (current) drug therapy: Secondary | ICD-10-CM

## 2016-02-13 DIAGNOSIS — R7989 Other specified abnormal findings of blood chemistry: Secondary | ICD-10-CM

## 2016-02-13 DIAGNOSIS — J189 Pneumonia, unspecified organism: Secondary | ICD-10-CM | POA: Diagnosis present

## 2016-02-13 DIAGNOSIS — R112 Nausea with vomiting, unspecified: Secondary | ICD-10-CM | POA: Diagnosis not present

## 2016-02-13 LAB — I-STAT ARTERIAL BLOOD GAS, ED
ACID-BASE EXCESS: 2 mmol/L (ref 0.0–2.0)
Bicarbonate: 26.5 mEq/L — ABNORMAL HIGH (ref 20.0–24.0)
O2 Saturation: 89 %
PH ART: 7.443 (ref 7.350–7.450)
PO2 ART: 54 mmHg — AB (ref 80.0–100.0)
TCO2: 28 mmol/L (ref 0–100)
pCO2 arterial: 38.7 mmHg (ref 35.0–45.0)

## 2016-02-13 LAB — CBC WITH DIFFERENTIAL/PLATELET
Basophils Absolute: 0 10*3/uL (ref 0.0–0.1)
Basophils Relative: 0 %
EOS PCT: 0 %
Eosinophils Absolute: 0 10*3/uL (ref 0.0–0.7)
HCT: 38.7 % — ABNORMAL LOW (ref 39.0–52.0)
Hemoglobin: 11.8 g/dL — ABNORMAL LOW (ref 13.0–17.0)
LYMPHS ABS: 1.4 10*3/uL (ref 0.7–4.0)
LYMPHS PCT: 8 %
MCH: 24 pg — AB (ref 26.0–34.0)
MCHC: 30.5 g/dL (ref 30.0–36.0)
MCV: 78.8 fL (ref 78.0–100.0)
MONO ABS: 0.7 10*3/uL (ref 0.1–1.0)
MONOS PCT: 4 %
Neutro Abs: 15.8 10*3/uL — ABNORMAL HIGH (ref 1.7–7.7)
Neutrophils Relative %: 88 %
PLATELETS: 340 10*3/uL (ref 150–400)
RBC: 4.91 MIL/uL (ref 4.22–5.81)
RDW: 15 % (ref 11.5–15.5)
WBC: 17.9 10*3/uL — AB (ref 4.0–10.5)

## 2016-02-13 LAB — COMPREHENSIVE METABOLIC PANEL
ALBUMIN: 3.1 g/dL — AB (ref 3.5–5.0)
ALK PHOS: 58 U/L (ref 38–126)
ALT: 14 U/L — ABNORMAL LOW (ref 17–63)
AST: 32 U/L (ref 15–41)
Anion gap: 14 (ref 5–15)
BUN: 15 mg/dL (ref 6–20)
CHLORIDE: 98 mmol/L — AB (ref 101–111)
CO2: 26 mmol/L (ref 22–32)
CREATININE: 0.93 mg/dL (ref 0.61–1.24)
Calcium: 9 mg/dL (ref 8.9–10.3)
GFR calc Af Amer: >60 mL/min (ref >60)
GFR calc non Af Amer: >60 mL/min (ref >60)
GLUCOSE: 92 mg/dL (ref 65–99)
Potassium: 3.9 mmol/L (ref 3.5–5.1)
Sodium: 138 mmol/L (ref 135–145)
Total Bilirubin: 0.3 mg/dL (ref 0.3–1.2)
Total Protein: 7 g/dL (ref 6.5–8.1)

## 2016-02-13 LAB — I-STAT CG4 LACTIC ACID, ED: LACTIC ACID, VENOUS: 6.31 mmol/L — AB (ref 0.5–1.9)

## 2016-02-13 LAB — URINALYSIS, ROUTINE W REFLEX MICROSCOPIC
Bilirubin Urine: NEGATIVE
Glucose, UA: NEGATIVE mg/dL
KETONES UR: NEGATIVE mg/dL
LEUKOCYTES UA: NEGATIVE
NITRITE: NEGATIVE
PH: 7 (ref 5.0–8.0)
Specific Gravity, Urine: 1.02 (ref 1.005–1.030)

## 2016-02-13 LAB — I-STAT TROPONIN, ED: Troponin i, poc: 0.13 ng/mL (ref 0.00–0.08)

## 2016-02-13 LAB — URINE MICROSCOPIC-ADD ON

## 2016-02-13 LAB — LIPASE, BLOOD: Lipase: 22 U/L (ref 11–51)

## 2016-02-13 LAB — CBG MONITORING, ED
GLUCOSE-CAPILLARY: 100 mg/dL — AB (ref 65–99)
Glucose-Capillary: 86 mg/dL (ref 65–99)

## 2016-02-13 MED ORDER — SODIUM CHLORIDE 0.9 % IV BOLUS (SEPSIS)
1000.0000 mL | Freq: Once | INTRAVENOUS | Status: AC
Start: 2016-02-13 — End: 2016-02-13
  Administered 2016-02-13: 1000 mL via INTRAVENOUS

## 2016-02-13 MED ORDER — CEFEPIME HCL 2 G IJ SOLR
2.0000 g | Freq: Once | INTRAMUSCULAR | Status: AC
  Administered 2016-02-13: 2 g via INTRAVENOUS
  Filled 2016-02-13: qty 2

## 2016-02-13 MED ORDER — VANCOMYCIN HCL 10 G IV SOLR
1500.0000 mg | Freq: Once | INTRAVENOUS | Status: AC
  Administered 2016-02-13: 1500 mg via INTRAVENOUS
  Filled 2016-02-13: qty 1500

## 2016-02-13 MED ORDER — VANCOMYCIN HCL IN DEXTROSE 1-5 GM/200ML-% IV SOLN
1000.0000 mg | Freq: Two times a day (BID) | INTRAVENOUS | Status: DC
Start: 2016-02-14 — End: 2016-02-17
  Administered 2016-02-14 – 2016-02-17 (×7): 1000 mg via INTRAVENOUS
  Filled 2016-02-13 (×8): qty 200

## 2016-02-13 MED ORDER — METOCLOPRAMIDE HCL 5 MG/ML IJ SOLN
10.0000 mg | Freq: Once | INTRAMUSCULAR | Status: AC
  Administered 2016-02-14: 10 mg via INTRAVENOUS
  Filled 2016-02-13: qty 2

## 2016-02-13 MED ORDER — SODIUM CHLORIDE 0.9 % IV BOLUS (SEPSIS)
500.0000 mL | Freq: Once | INTRAVENOUS | Status: AC
  Administered 2016-02-13: 500 mL via INTRAVENOUS

## 2016-02-13 MED ORDER — ACETAMINOPHEN 650 MG RE SUPP
650.0000 mg | Freq: Once | RECTAL | Status: AC
  Administered 2016-02-13: 650 mg via RECTAL
  Filled 2016-02-13: qty 1

## 2016-02-13 MED ORDER — DEXTROSE 5 % IV SOLN
2.0000 g | Freq: Three times a day (TID) | INTRAVENOUS | Status: DC
  Administered 2016-02-14 – 2016-02-17 (×11): 2 g via INTRAVENOUS
  Filled 2016-02-13 (×12): qty 2

## 2016-02-13 MED ORDER — SODIUM CHLORIDE 0.9 % IV BOLUS (SEPSIS)
1000.0000 mL | Freq: Once | INTRAVENOUS | Status: AC
  Administered 2016-02-13: 1000 mL via INTRAVENOUS

## 2016-02-13 MED ORDER — ASPIRIN EC 325 MG PO TBEC
325.0000 mg | DELAYED_RELEASE_TABLET | Freq: Once | ORAL | Status: AC
  Administered 2016-02-13: 325 mg via ORAL
  Filled 2016-02-13: qty 1

## 2016-02-13 NOTE — ED Provider Notes (Signed)
CSN: [id number]     Arrival date & time 02/13/16  2109 History   First MD Initiated Contact with Patient 02/13/16 2123     Chief Complaint  Patient presents with  . Weakness     (Consider location/radiation/quality/duration/timing/severity/associated sxs/prior Treatment) Patient is a 60 y.o. male presenting with vomiting. The history is provided by the patient.  Emesis Severity:  Severe Duration:  1 day Timing:  Constant Quality:  Stomach contents Progression:  Worsening Chronicity:  New Relieved by:  Nothing Worsened by:  Nothing tried Ineffective treatments:  None tried Associated symptoms: diarrhea, fever and headaches   Associated symptoms: no abdominal pain     Past Medical History  Diagnosis Date  . Hypertension   . Intellectual disability     Sister helps to take care of him and takes him to appts  . Tobacco user     Smokes 1ppd for multiple years.  Quit after hosp 09/2010.  Marland Kitchen Anemia     History of Iron Def Anemia  . Anxiety   . Hyperlipidemia   . PAF (paroxysmal atrial fibrillation) ([place]) 06/2009    CHADS score 2 (HTN, DM), was not on coumadin, but now on Pradaxa for Afib  . Lung nodule   . Chronic low back pain   . CVA (cerebral infarction) 09/2010    Bilateral with Left > Right  . Abdominal hernia     Chronic, not a good surgical candidate  . Carotid artery occlusion   . Obesity   . COPD (chronic obstructive pulmonary disease) ([place])   . Abscess, abdomen ([place]) 12/31/2010    Referred to Wound Care in 01/2011 because of multiple abd abscess with VERY large ventral hernia (please look at image of CT abd/pelvis 09/2010).  Because of hernia I was hesitant to I&D.      Marland Kitchen GERD (gastroesophageal reflux disease)   . Pneumonia     hx of  . Chronic diastolic CHF (congestive heart failure) (HCC)     takes Lasix  . Itching     all over body; pt scratches and has sores on bilateral arms and abdomen  . Hx of echocardiogram     Echo 5/16:  Mild LVH, EF 55%, indeterm. diast  function, WMA could not be ruled out, MAC, trivial MR, mild LAE, normal RVF //  b. Echo 4/17: EF 55-60%, no RWMA, Gr 2 DD, Ao sclerosis, MAC, mild MS, mild LAE, PASP 55 mHg  . Myocardial infarction [facility]) 2016 ?    Heart attack  (  Per  pt. )  . History of nuclear stress test     Myoview 9/16:  Inferior, apical and inf-lateral ischemia; not gated; High Risk  . Shortness of breath dyspnea     with exertion  . Diabetes mellitus   . Oxygen dependent     wears 2 liters at bedtime and when needed  . Wears glasses   . Stroke [facility])    Past Surgical History  Procedure Laterality Date  . Carotid arteriogram  10/2010    30% right ICA stenosis, 40% left ICA stenosis   . Transesophageal echocardiogram  09/2010    No ASD or PFO. EF 60-65%.  Normal systolic function. No evidence of thrombus.   . Transthoracic echocardiogram  09/2010     The cavity size was normal. Systolic function was vigorous.  EF 65-70%.  Normal wall funciton.   . Debridment of decubitus ulcer Right 02/13/2013  . I&d extremity Right 02/13/2013    Procedure:  IRRIGATION AND DEBRIDEMENT FOOT ULCER;  Surgeon: Johnny Bridge, MD;  Location: [place];  Service: Orthopedics;  Laterality: Right;  PULSE LAVAGE  . Cataract extraction, bilateral    . Arm surgery Left     as a child  . Amputation Right 03/29/2013    Procedure: AMPUTATION RAY;  Surgeon: Newt Minion, MD;  Location: [place];  Service: Orthopedics;  Laterality: Right;  Right Foot 3rd and Possible 4th Ray Amputation  . Multiple tooth extractions    . Amputation Right 04/23/2013    Procedure: AMPUTATION RIGHT MID-FOOT;  Surgeon: Newt Minion, MD;  Location: [place];  Service: Orthopedics;  Laterality: Right;  . Esophagogastroduodenoscopy (egd) with propofol N/A 06/10/2014    Procedure: ESOPHAGOGASTRODUODENOSCOPY (EGD) WITH PROPOFOL;  Surgeon: Jerene Bears, MD;  [facility];  Service: Gastroenterology;  Laterality: N/A;  . Colonoscopy with propofol N/A 06/10/2014    Procedure:  COLONOSCOPY WITH PROPOFOL;  Surgeon: Jerene Bears, MD;  Location: [facility];  Service: Gastroenterology;  Laterality: N/A;  . Givens capsule study N/A 07/09/2014    Procedure: GIVENS CAPSULE STUDY;  Surgeon: Jerene Bears, MD;  Location: [facility];  Service: Gastroenterology;  Laterality: N/A;  . Cardiac catheterization N/A 04/30/2015    Procedure: Left Heart Cath and Coronary Angiography;  Surgeon: Belva Crome, MD;  Location: [place] CV LAB;  Service: Cardiovascular;  Laterality: N/A;  . Coronary artery bypass graft N/A 08/13/2015    Procedure: CORONARY ARTERY BYPASS GRAFTING (CABG), ON PUMP, TIMES THREE, USING LEFT INTERNAL MAMMARY ARTERY, RIGHT GREATER SAPHENOUS VEIN HARVESTED ENDOSCOPICALLY;  Surgeon: [name], MD;  Location: [place] OR;  Service: Open Heart Surgery;  Laterality: N/A;  . Tee without cardioversion N/A 08/13/2015    Procedure: TRANSESOPHAGEAL ECHOCARDIOGRAM (TEE);  Surgeon: [name], MD;  Location: [place];  Service: Open Heart Surgery;  Laterality: N/A;  . Endarterectomy Left 08/13/2015    Procedure: ENDARTERECTOMY CAROTID;  Surgeon: Angelia Mould, MD;  Location: [facility] OR;  Service: Vascular;  Laterality: Left;  . Carotid endarterectomy Left 08-13-15    CEA   Family History  Problem Relation Age of Onset  . Heart disease Mother   . Hypertension Sister   . Heart disease Brother   . Heart disease Father   . Peripheral vascular disease Father   . Heart disease Maternal Grandmother   . Heart attack Maternal Grandmother   . Breast cancer Maternal Grandmother   . Colon cancer Neg Hx   . Stroke Neg Hx   . Stomach cancer Maternal Uncle    Social History  Substance Use Topics  . Smoking status: Former Smoker -- 1.00 packs/day for 42 years    Types: Cigarettes, Pipe    Quit date: 11/10/2010  . Smokeless tobacco: Former Systems developer    Quit date: 10/02/2010  . Alcohol Use: No     Comment: " last drink was 2003" 08/11/15    Review of Systems  Unable to perform ROS:  Acuity of condition  Gastrointestinal: Positive for vomiting and diarrhea. Negative for abdominal pain.  Neurological: Positive for headaches.      Allergies  Penicillins  Home Medications   Prior to Admission medications   Medication Sig Start Date End Date Taking? Authorizing Provider  acetaminophen (TYLENOL) 325 MG tablet Take 2 tablets (650 mg total) by mouth every 6 (six) hours as needed for mild pain. 08/21/15   Donielle Liston Alba, PA-C  aspirin EC 81 MG EC tablet Take 1 tablet (  81 mg total) by mouth daily. 11/11/12   Kandis Nab, MD  atorvastatin (LIPITOR) 80 MG tablet Take 1 tablet (80 mg total) by mouth daily at 6 PM. 11/03/14   Janece Canterbury, MD  carboxymethylcellulose (REFRESH PLUS) 0.5 % SOLN Place 1 drop into both eyes 2 (two) times daily.    Historical Provider, MD  Cholecalciferol (VITAMIN D3) 1000 UNITS CAPS Take 1,000 Units by mouth daily.    Historical Provider, MD  clonazePAM (KLONOPIN) 0.5 MG tablet Take 1 tablet (0.5 mg total) by mouth at bedtime. 11/03/14   Janece Canterbury, MD  dabigatran (PRADAXA) 150 MG CAPS capsule Take 150 mg by mouth 2 (two) times daily.    Historical Provider, MD  ferrous gluconate (FERGON) 324 MG tablet Take 1 tablet (324 mg total) by mouth daily with breakfast. 08/21/15   Donielle Liston Alba, PA-C  FLUDROCORTISONE ACETATE PO Take 0.1 mg by mouth daily.    Historical Provider, MD  furosemide (LASIX) 40 MG tablet Take 40 mg by mouth daily.    Historical Provider, MD  gabapentin (NEURONTIN) 300 MG capsule Take 1 capsule (300 mg total) by mouth 2 (two) times daily. 11/03/14   Janece Canterbury, MD  HYDROcodone-acetaminophen (NORCO/VICODIN) 5-325 MG tablet Take 1 tablet by mouth every 6 (six) hours as needed for moderate pain. 08/21/15   Donielle Liston Alba, PA-C  Insulin Glargine (LANTUS) 100 UNIT/ML Solostar Pen Inject 10 Units into the skin every evening.    Historical Provider, MD  metFORMIN (GLUCOPHAGE) 1000 MG tablet Take 1,000 mg by mouth 2  (two) times daily with a meal.    Historical Provider, MD  metoprolol tartrate (LOPRESSOR) 25 MG tablet Take 25 mg by mouth 2 (two) times daily.    Historical Provider, MD  omeprazole (PRILOSEC) 20 MG capsule Take 20 mg by mouth daily.    Historical Provider, MD  PARoxetine (PAXIL) 40 MG tablet Take 40 mg by mouth every morning.    Historical Provider, MD   BP 139/62 mmHg  Pulse 63  Temp(Src) 97.8 F (36.6 C) (Oral)  Resp 14  Ht 5' (1.524 m)  Wt 84.006 kg  BMI 36.17 kg/m2  SpO2 100% Physical Exam  Constitutional: He appears well-developed and well-nourished. He appears distressed.  Vomitting on exam  HENT:  Head: Normocephalic and atraumatic.  Eyes: Conjunctivae and EOM are normal. Pupils are equal, round, and reactive to light.  Neck: Normal range of motion. Neck supple.  Cardiovascular: Normal rate, regular rhythm, normal heart sounds and intact distal pulses.   Pulmonary/Chest: Effort normal. No respiratory distress. He has wheezes.  Abdominal: Soft. Bowel sounds are normal. He exhibits distension. There is no tenderness. There is no rigidity, no guarding and no CVA tenderness.  Musculoskeletal: Normal range of motion.  Neurological: He is alert. He has normal reflexes. No cranial nerve deficit. GCS eye subscore is 4. GCS verbal subscore is 5. GCS motor subscore is 6.  Somnolent  Skin: Skin is warm and dry.    ED Course  Procedures (including critical care time) Labs Review Labs Reviewed  COMPREHENSIVE METABOLIC PANEL - Abnormal; Notable for the following:    Chloride 98 (*)    Albumin 3.1 (*)    ALT 14 (*)    All other components within normal limits  CBC WITH DIFFERENTIAL/PLATELET - Abnormal; Notable for the following:    WBC 17.9 (*)    Hemoglobin 11.8 (*)    HCT 38.7 (*)    MCH 24.0 (*)    Neutro  Abs 15.8 (*)    All other components within normal limits  URINALYSIS, ROUTINE W REFLEX MICROSCOPIC (NOT AT [facility]) - Abnormal; Notable for the following:    APPearance  CLOUDY (*)    Hgb urine dipstick MODERATE (*)    Protein, ur >300 (*)    All other components within normal limits  URINE MICROSCOPIC-ADD ON - Abnormal; Notable for the following:    Squamous Epithelial / LPF 6-30 (*)    Bacteria, UA FEW (*)    All other components within normal limits  LACTIC ACID, PLASMA - Abnormal; Notable for the following:    Lactic Acid, Venous 3.9 (*)    All other components within normal limits  LACTIC ACID, PLASMA - Abnormal; Notable for the following:    Lactic Acid, Venous 2.1 (*)    All other components within normal limits  TROPONIN I - Abnormal; Notable for the following:    Troponin I 0.40 (*)    All other components within normal limits  TROPONIN I - Abnormal; Notable for the following:    Troponin I 0.62 (*)    All other components within normal limits  BASIC METABOLIC PANEL - Abnormal; Notable for the following:    Chloride 100 (*)    Glucose, Bld 141 (*)    Calcium 8.0 (*)    All other components within normal limits  CBC - Abnormal; Notable for the following:    WBC 13.8 (*)    Hemoglobin 10.9 (*)    HCT 36.3 (*)    MCH 23.9 (*)    All other components within normal limits  GLUCOSE, CAPILLARY - Abnormal; Notable for the following:    Glucose-Capillary 184 (*)    All other components within normal limits  GLUCOSE, CAPILLARY - Abnormal; Notable for the following:    Glucose-Capillary 125 (*)    All other components within normal limits  I-STAT TROPOININ, ED - Abnormal; Notable for the following:    Troponin i, poc 0.13 (*)    All other components within normal limits  I-STAT CG4 LACTIC ACID, ED - Abnormal; Notable for the following:    Lactic Acid, Venous 6.31 (*)    All other components within normal limits  I-STAT ARTERIAL BLOOD GAS, ED - Abnormal; Notable for the following:    pO2, Arterial 54.0 (*)    Bicarbonate 26.5 (*)    All other components within normal limits  CBG MONITORING, ED - Abnormal; Notable for the following:     Glucose-Capillary 100 (*)    All other components within normal limits  CBG MONITORING, ED - Abnormal; Notable for the following:    Glucose-Capillary 148 (*)    All other components within normal limits  CBG MONITORING, ED - Abnormal; Notable for the following:    Glucose-Capillary 136 (*)    All other components within normal limits  MRSA PCR SCREENING  CULTURE, BLOOD (ROUTINE X 2)  CULTURE, BLOOD (ROUTINE X 2)  URINE CULTURE  LIPASE, BLOOD  TROPONIN I  CBG MONITORING, ED    Imaging Review Dg Chest Portable 1 View  02/13/2016  CLINICAL DATA:  60 year old male with generalized weakness since 8 a.m. this morning. Atrial fibrillation. Nausea and vomiting. Right-sided chest pain. EXAM: PORTABLE CHEST 1 VIEW COMPARISON:  Chest x-ray 09/16/2015. FINDINGS: Lung volumes are low. Diffuse peribronchial cuffing. Patchy ill-defined airspace opacities in the mid to lower left hemithorax. Right lung appears clear. No pleural effusions. No evidence of pulmonary edema. Mild cardiomegaly. The patient is rotated  to the left on today's exam, resulting in distortion of the mediastinal contours and reduced diagnostic sensitivity and specificity for mediastinal pathology. Atherosclerosis in the thoracic aorta. Status post median sternotomy for CABG. IMPRESSION: 1. Peribronchial cuffing, concerning for an acute bronchitis. There also appears to be some ill-defined airspace disease throughout the mid to lower left lung which could indicate developing bronchopneumonia. Followup PA and lateral chest X-ray is recommended in 3-4 weeks following trial of antibiotic therapy to ensure resolution and exclude underlying malignancy. 2. Aortic atherosclerosis. 3. Mild cardiomegaly. Electronically Signed   By: Vinnie Langton M.D.   On: 02/13/2016 22:14   I have personally reviewed and evaluated these images and lab results as part of my medical decision-making.   EKG Interpretation   Date/Time:  Saturday February 13 2016  21:40:20 EDT Ventricular Rate:  75 PR Interval:    QRS Duration: 131 QT Interval:  425 QTC Calculation: 475 R Axis:   -77 Text Interpretation:  Sinus rhythm Right bundle branch block Inferior  infarct, old Probable posterior infarct, acute Baseline wander in lead(s)  I II III aVR aVL aVF Interpretation limited secondary to artifact RBBB  present on previous EKG Confirmed by LITTLE MD, RACHEL [phone]) on 02/13/2016  9:52:48 PM      MDM   Final diagnoses:  CAP (community acquired pneumonia)  Elevated troponin  Non-intractable vomiting with nausea, vomiting of unspecified type  Sepsis, due to unspecified organism ([place])  Lactic acidosis    The pt is a 60 yo male presenting to the ED for emesis, diarrhea, and fatigue throughout the day.  Hx of DM, HTN, HLD, CHF, pervious episodes of sepsis, stroke, and CHF.    On arrival pt febrile to 102 with no hypotension or tachycardia.  CBC with leukocytosis and LA of 6.3.  30cc/kg fluids ordered and broad spectrum abx given.  Emesis on exam and given zofran and reglan with alleviation.  CXR concerning for developing PNA.  Trop mildly elevated with hx of CAD s/p CABG.  ECG without acute ischemic changes.  ASA given in the ED and plan to trend trop and LA's.    Discussed with hospitalist who agrees to admission to the stepdown unit.   Labs and ECG were viewed by myself and incorporated into medical decision making.  Discussed pertinent finding with patient or caregiver prior to admission with no further questions.  Pt care supervised by my attending Dr. Rex Kras.   Geronimo Boot, MD PGY-3 Emergency Medicine      Geronimo Boot, MD 02/14/16 [place], MD 02/18/16 [phone]

## 2016-02-13 NOTE — Progress Notes (Signed)
Pharmacy Antibiotic Note  [name] is a 60 y.o. male admitted on 02/13/2016 with sepsis.  Pharmacy has been consulted for cefepime dosing. Also has vanc ordered x 1 dose in the ED. Tmax/24h 102.5, SCr pending. PCN allergy noted, but patient has tolerated cefazolin here several times in the past.  Plan: Vanc 1500mg  x1 in the ED per MD Cefepime 2g IV x 1 Monitor clinical progress, c/s, renal function, abx plan/LOT F/u SCr for abx maintenance doses     Temp (24hrs), Avg:100.8 F (38.2 C), Min:99 F (37.2 C), Max:102.5 F (39.2 C)  No results for input(s): WBC, CREATININE, LATICACIDVEN, VANCOTROUGH, VANCOPEAK, VANCORANDOM, GENTTROUGH, GENTPEAK, GENTRANDOM, TOBRATROUGH, TOBRAPEAK, TOBRARND, AMIKACINPEAK, AMIKACINTROU, AMIKACIN in the last 168 hours.  CrCl cannot be calculated (Unknown ideal weight.).    Allergies  Allergen Reactions  . Penicillins Hives, Nausea And Vomiting, Swelling and Other (See Comments)    Has patient had a PCN reaction causing immediate rash, facial/tongue/throat swelling, SOB or lightheadedness with hypotension: YES Has patient had a PCN reaction causing severe rash involving mucus membranes or skin necrosis: No Has patient had a PCN reaction that required hospitalization No Has patient had a PCN reaction occurring within the last 10 years: No If all of the above answers are "NO", then may proceed with Cephalosporin use.     Antimicrobials this admission: 7/8 cefepime >>  7/8 vanc x1  Dose adjustments this admission:   Microbiology results: 7/8 BCx:  7/8 UCx:     Elicia Lamp, PharmD, [facility] Clinical Pharmacist Pager [phone] 02/13/2016 10:02 PM

## 2016-02-13 NOTE — ED Notes (Signed)
Pt here with generalized weakness starting at 0800 this morning. Pt has known afib. When asked what is wrong, pt sts "I don't want to talk about it." Pt reports nausea and vomiting. Pt has dry heaves at this time. Pt a/o x 4. EMS also reports pt was hypoglycemic at 72 upon their srrival. Pt has diarrhea on arrival. Pt also reports right sided chest pain.

## 2016-02-13 NOTE — ED Notes (Signed)
Checked CBG 100, RN Melissa informed

## 2016-02-13 NOTE — Progress Notes (Signed)
Pharmacy Antibiotic Note  [name] is a 60 y.o. male admitted on 02/13/2016 with sepsis.  Pharmacy has been consulted for Vancomycin/Cefepime dosing. Labs are now back and will order maintenance doses.  Plan: -Vancomycin 1000 mg IV q12h -Cefepime 2g IV q8h -Drug levels as indicated   Temp (24hrs), Avg:100.8 F (38.2 C), Min:99 F (37.2 C), Max:102.5 F (39.2 C)   Recent Labs Lab 02/13/16 2155 02/13/16 2204  WBC 17.9*  --   CREATININE 0.93  --   LATICACIDVEN  --  6.31*    CrCl cannot be calculated (Unknown ideal weight.).    Allergies  Allergen Reactions  . Penicillins Hives, Nausea And Vomiting, Swelling and Other (See Comments)    Has patient had a PCN reaction causing immediate rash, facial/tongue/throat swelling, SOB or lightheadedness with hypotension: YES Has patient had a PCN reaction causing severe rash involving mucus membranes or skin necrosis: No Has patient had a PCN reaction that required hospitalization No Has patient had a PCN reaction occurring within the last 10 years: No If all of the above answers are "NO", then may proceed with Cephalosporin use.      Narda Bonds 02/13/2016 11:32 PM

## 2016-02-14 ENCOUNTER — Encounter (HOSPITAL_COMMUNITY): Payer: Self-pay | Admitting: Emergency Medicine

## 2016-02-14 DIAGNOSIS — R112 Nausea with vomiting, unspecified: Secondary | ICD-10-CM

## 2016-02-14 DIAGNOSIS — K439 Ventral hernia without obstruction or gangrene: Secondary | ICD-10-CM

## 2016-02-14 DIAGNOSIS — R778 Other specified abnormalities of plasma proteins: Secondary | ICD-10-CM | POA: Diagnosis present

## 2016-02-14 DIAGNOSIS — F411 Generalized anxiety disorder: Secondary | ICD-10-CM

## 2016-02-14 DIAGNOSIS — R7989 Other specified abnormal findings of blood chemistry: Secondary | ICD-10-CM

## 2016-02-14 DIAGNOSIS — D509 Iron deficiency anemia, unspecified: Secondary | ICD-10-CM

## 2016-02-14 DIAGNOSIS — J189 Pneumonia, unspecified organism: Secondary | ICD-10-CM | POA: Diagnosis present

## 2016-02-14 DIAGNOSIS — E119 Type 2 diabetes mellitus without complications: Secondary | ICD-10-CM

## 2016-02-14 DIAGNOSIS — I480 Paroxysmal atrial fibrillation: Secondary | ICD-10-CM

## 2016-02-14 DIAGNOSIS — E669 Obesity, unspecified: Secondary | ICD-10-CM

## 2016-02-14 LAB — BASIC METABOLIC PANEL
ANION GAP: 10 (ref 5–15)
BUN: 14 mg/dL (ref 6–20)
CHLORIDE: 100 mmol/L — AB (ref 101–111)
CO2: 27 mmol/L (ref 22–32)
Calcium: 8 mg/dL — ABNORMAL LOW (ref 8.9–10.3)
Creatinine, Ser: 0.85 mg/dL (ref 0.61–1.24)
GFR calc Af Amer: >60 mL/min (ref >60)
GLUCOSE: 141 mg/dL — AB (ref 65–99)
POTASSIUM: 3.9 mmol/L (ref 3.5–5.1)
Sodium: 137 mmol/L (ref 135–145)

## 2016-02-14 LAB — LACTIC ACID, PLASMA
LACTIC ACID, VENOUS: 3.9 mmol/L — AB (ref 0.5–1.9)
LACTIC ACID, VENOUS: 2.1 mmol/L — AB (ref 0.5–1.9)
LACTIC ACID, VENOUS: 1.6 mmol/L (ref 0.5–1.9)
Lactic Acid, Venous: 1 mmol/L (ref 0.5–1.9)

## 2016-02-14 LAB — CBG MONITORING, ED
GLUCOSE-CAPILLARY: 136 mg/dL — AB (ref 65–99)
Glucose-Capillary: 148 mg/dL — ABNORMAL HIGH (ref 65–99)

## 2016-02-14 LAB — CBC
HEMATOCRIT: 36.3 % — AB (ref 39.0–52.0)
HEMOGLOBIN: 10.9 g/dL — AB (ref 13.0–17.0)
MCH: 23.9 pg — ABNORMAL LOW (ref 26.0–34.0)
MCHC: 30 g/dL (ref 30.0–36.0)
MCV: 79.6 fL (ref 78.0–100.0)
Platelets: 269 10*3/uL (ref 150–400)
RBC: 4.56 MIL/uL (ref 4.22–5.81)
RDW: 15 % (ref 11.5–15.5)
WBC: 13.8 10*3/uL — AB (ref 4.0–10.5)

## 2016-02-14 LAB — TROPONIN I
TROPONIN I: 0.49 ng/mL — AB (ref <0.03)
TROPONIN I: 0.29 ng/mL — AB (ref <0.03)
TROPONIN I: 0.19 ng/mL — AB (ref <0.03)
Troponin I: 0.4 ng/mL (ref <0.03)
Troponin I: 0.62 ng/mL (ref <0.03)

## 2016-02-14 LAB — GLUCOSE, CAPILLARY
GLUCOSE-CAPILLARY: 125 mg/dL — AB (ref 65–99)
Glucose-Capillary: 184 mg/dL — ABNORMAL HIGH (ref 65–99)
Glucose-Capillary: 174 mg/dL — ABNORMAL HIGH (ref 65–99)
Glucose-Capillary: 252 mg/dL — ABNORMAL HIGH (ref 65–99)

## 2016-02-14 LAB — MRSA PCR SCREENING: MRSA BY PCR: NEGATIVE

## 2016-02-14 MED ORDER — IPRATROPIUM BROMIDE 0.02 % IN SOLN: 0.5000 mg | RESPIRATORY_TRACT | Status: DC | PRN

## 2016-02-14 MED ORDER — SODIUM CHLORIDE 0.9 % IV SOLN
INTRAVENOUS | Status: DC
  Administered 2016-02-14: 03:00:00 via INTRAVENOUS

## 2016-02-14 MED ORDER — ONDANSETRON HCL 4 MG/2ML IJ SOLN: 4.0000 mg | Freq: Four times a day (QID) | INTRAMUSCULAR | Status: DC | PRN

## 2016-02-14 MED ORDER — HYDROCODONE-ACETAMINOPHEN 5-325 MG PO TABS: 1.0000 | ORAL_TABLET | Freq: Four times a day (QID) | ORAL | Status: DC | PRN

## 2016-02-14 MED ORDER — GI COCKTAIL ~~LOC~~
30.0000 mL | Freq: Three times a day (TID) | ORAL | Status: DC | PRN
  Administered 2016-02-16: 30 mL via ORAL
  Filled 2016-02-14: qty 30

## 2016-02-14 MED ORDER — GABAPENTIN 300 MG PO CAPS
300.0000 mg | ORAL_CAPSULE | Freq: Two times a day (BID) | ORAL | Status: DC
  Administered 2016-02-14 – 2016-02-19 (×12): 300 mg via ORAL
  Filled 2016-02-14 (×12): qty 1

## 2016-02-14 MED ORDER — INSULIN ASPART 100 UNIT/ML ~~LOC~~ SOLN
0.0000 [IU] | SUBCUTANEOUS | Status: DC
  Administered 2016-02-14: 2 [IU] via SUBCUTANEOUS
  Administered 2016-02-14: 1 [IU] via SUBCUTANEOUS
  Administered 2016-02-14: 2 [IU] via SUBCUTANEOUS
  Administered 2016-02-14 (×2): 1 [IU] via SUBCUTANEOUS
  Administered 2016-02-14: 5 [IU] via SUBCUTANEOUS
  Administered 2016-02-15: 2 [IU] via SUBCUTANEOUS
  Administered 2016-02-15: 1 [IU] via SUBCUTANEOUS
  Administered 2016-02-15: 2 [IU] via SUBCUTANEOUS
  Administered 2016-02-15: 5 [IU] via SUBCUTANEOUS
  Administered 2016-02-15: 2 [IU] via SUBCUTANEOUS
  Administered 2016-02-16: 1 [IU] via SUBCUTANEOUS
  Administered 2016-02-16: 3 [IU] via SUBCUTANEOUS
  Administered 2016-02-16: 2 [IU] via SUBCUTANEOUS
  Administered 2016-02-16: 5 [IU] via SUBCUTANEOUS
  Administered 2016-02-16: 1 [IU] via SUBCUTANEOUS
  Administered 2016-02-17: 2 [IU] via SUBCUTANEOUS
  Administered 2016-02-17: 9 [IU] via SUBCUTANEOUS
  Administered 2016-02-17: 2 [IU] via SUBCUTANEOUS
  Administered 2016-02-17: 1 [IU] via SUBCUTANEOUS
  Administered 2016-02-17: 3 [IU] via SUBCUTANEOUS
  Administered 2016-02-17 – 2016-02-18 (×2): 2 [IU] via SUBCUTANEOUS
  Administered 2016-02-18: 3 [IU] via SUBCUTANEOUS
  Administered 2016-02-18 – 2016-02-19 (×3): 2 [IU] via SUBCUTANEOUS
  Administered 2016-02-19: 3 [IU] via SUBCUTANEOUS
  Filled 2016-02-14 (×3): qty 1

## 2016-02-14 MED ORDER — ACETAMINOPHEN 325 MG PO TABS
650.0000 mg | ORAL_TABLET | Freq: Four times a day (QID) | ORAL | Status: DC | PRN
  Administered 2016-02-14 – 2016-02-17 (×2): 650 mg via ORAL
  Filled 2016-02-14 (×2): qty 2

## 2016-02-14 MED ORDER — POLYVINYL ALCOHOL 1.4 % OP SOLN
1.0000 [drp] | Freq: Two times a day (BID) | OPHTHALMIC | Status: DC
  Administered 2016-02-15 – 2016-02-19 (×8): 1 [drp] via OPHTHALMIC
  Filled 2016-02-14 (×2): qty 15

## 2016-02-14 MED ORDER — CLONAZEPAM 0.5 MG PO TABS
0.5000 mg | ORAL_TABLET | Freq: Every day | ORAL | Status: DC
  Administered 2016-02-14 – 2016-02-18 (×6): 0.5 mg via ORAL
  Filled 2016-02-14 (×6): qty 1

## 2016-02-14 MED ORDER — GI COCKTAIL ~~LOC~~
30.0000 mL | Freq: Once | ORAL | Status: AC
Start: 2016-02-14 — End: 2016-02-14
  Administered 2016-02-14: 30 mL via ORAL
  Filled 2016-02-14: qty 30

## 2016-02-14 MED ORDER — ALBUTEROL SULFATE (2.5 MG/3ML) 0.083% IN NEBU: 2.5000 mg | INHALATION_SOLUTION | RESPIRATORY_TRACT | Status: DC | PRN

## 2016-02-14 MED ORDER — FLUDROCORTISONE ACETATE 0.1 MG PO TABS
0.1000 mg | ORAL_TABLET | Freq: Every day | ORAL | Status: DC
  Administered 2016-02-14 – 2016-02-19 (×6): 0.1 mg via ORAL
  Filled 2016-02-14 (×7): qty 1

## 2016-02-14 MED ORDER — SODIUM CHLORIDE 0.9% FLUSH
3.0000 mL | Freq: Two times a day (BID) | INTRAVENOUS | Status: DC
  Administered 2016-02-14 – 2016-02-19 (×9): 3 mL via INTRAVENOUS

## 2016-02-14 MED ORDER — DABIGATRAN ETEXILATE MESYLATE 150 MG PO CAPS
150.0000 mg | ORAL_CAPSULE | Freq: Two times a day (BID) | ORAL | Status: DC
  Administered 2016-02-14 – 2016-02-19 (×12): 150 mg via ORAL
  Filled 2016-02-14 (×14): qty 1

## 2016-02-14 MED ORDER — FERROUS GLUCONATE 324 (38 FE) MG PO TABS
324.0000 mg | ORAL_TABLET | Freq: Every day | ORAL | Status: DC
  Administered 2016-02-14 – 2016-02-19 (×6): 324 mg via ORAL
  Filled 2016-02-14 (×7): qty 1

## 2016-02-14 MED ORDER — ACETAMINOPHEN 650 MG RE SUPP: 650.0000 mg | Freq: Four times a day (QID) | RECTAL | Status: DC | PRN

## 2016-02-14 MED ORDER — ONDANSETRON HCL 4 MG PO TABS
4.0000 mg | ORAL_TABLET | Freq: Four times a day (QID) | ORAL | Status: DC | PRN
  Administered 2016-02-15: 4 mg via ORAL
  Filled 2016-02-14: qty 1

## 2016-02-14 MED ORDER — VITAMIN D 1000 UNITS PO TABS
1000.0000 [IU] | ORAL_TABLET | Freq: Every day | ORAL | Status: DC
  Administered 2016-02-14 – 2016-02-19 (×6): 1000 [IU] via ORAL
  Filled 2016-02-14 (×6): qty 1

## 2016-02-14 MED ORDER — FUROSEMIDE 40 MG PO TABS
40.0000 mg | ORAL_TABLET | Freq: Every day | ORAL | Status: DC
  Administered 2016-02-14 – 2016-02-19 (×6): 40 mg via ORAL
  Filled 2016-02-14 (×6): qty 1

## 2016-02-14 MED ORDER — PNEUMOCOCCAL VAC POLYVALENT 25 MCG/0.5ML IJ INJ
0.5000 mL | INJECTION | INTRAMUSCULAR | Status: AC
  Administered 2016-02-15: 0.5 mL via INTRAMUSCULAR
  Filled 2016-02-14: qty 0.5

## 2016-02-14 MED ORDER — ATORVASTATIN CALCIUM 80 MG PO TABS
80.0000 mg | ORAL_TABLET | Freq: Every day | ORAL | Status: DC
  Administered 2016-02-14 – 2016-02-18 (×5): 80 mg via ORAL
  Filled 2016-02-14 (×5): qty 1

## 2016-02-14 MED ORDER — PAROXETINE HCL 20 MG PO TABS
40.0000 mg | ORAL_TABLET | Freq: Every day | ORAL | Status: DC
  Administered 2016-02-14 – 2016-02-19 (×6): 40 mg via ORAL
  Filled 2016-02-14 (×6): qty 2

## 2016-02-14 MED ORDER — METOPROLOL TARTRATE 25 MG PO TABS
25.0000 mg | ORAL_TABLET | Freq: Two times a day (BID) | ORAL | Status: DC
  Administered 2016-02-14 – 2016-02-18 (×9): 25 mg via ORAL
  Filled 2016-02-14 (×10): qty 1

## 2016-02-14 MED ORDER — SODIUM CHLORIDE 0.9 % IV SOLN
INTRAVENOUS | Status: AC
  Administered 2016-02-14: 18:00:00 via INTRAVENOUS

## 2016-02-14 NOTE — ED Notes (Signed)
Pt being transported upstairs at this time.

## 2016-02-14 NOTE — ED Notes (Signed)
CBG 125 °

## 2016-02-14 NOTE — Progress Notes (Signed)
Patient ID: [name], male   DOB: 08-22-1955, 60 y.o.   MRN: [id number]   PROGRESS NOTE    [name]  [id number] DOB: [birth year] DOA: 02/13/2016  PCP: [name], MD   Brief Narrative:   Pt admitted after midnight, admitted with sepsis secondary to mid to lower left lung PNA, intermittent chest pain. Mild elevation in troponins, cardiology consulted over the phone, recommended ECHO and to call back when results are back. Continue to trend troponins.   Objective: Filed Vitals:   02/14/16 0408 02/14/16 0430 02/14/16 0515 02/14/16 0600  BP: 147/83 142/71 146/74 121/68  Pulse:  69 67 65  Temp:      TempSrc:      Resp:      SpO2:  94% 97% 94%    Intake/Output Summary (Last 24 hours) at 02/14/16 0733 Last data filed at 02/14/16 0330  Gross per 24 hour  Intake   1006 ml  Output      0 ml  Net   1006 ml   There were no vitals filed for this visit.  Examination:  General exam: Appears calm and comfortable  Respiratory system: Respiratory effort normal. Diminished breath sounds at bases with rhonchi  Cardiovascular system: S1 & S2 heard, RRR. No JVD, rubs, gallops or clicks. No pedal edema. Gastrointestinal system: Abdomen is nondistended, soft and nontender. No organomegaly or masses felt.  Central nervous system: Alert and oriented. No focal neurological deficits.  Data Reviewed: I have personally reviewed following labs and imaging studies  CBC:  Recent Labs Lab 02/13/16 2155 02/14/16 0420  WBC 17.9* 13.8*  NEUTROABS 15.8*  --   HGB 11.8* 10.9*  HCT 38.7* 36.3*  MCV 78.8 79.6  PLT 340 [id number]   Basic Metabolic Panel:  Recent Labs Lab 02/13/16 2155 02/14/16 0420  NA 138 137  K 3.9 3.9  CL 98* 100*  CO2 26 27  GLUCOSE 92 141*  BUN 15 14  CREATININE 0.93 0.85  CALCIUM 9.0 8.0*   Liver Function Tests:  Recent Labs Lab 02/13/16 2155  AST 32  ALT 14*  ALKPHOS 58  BILITOT 0.3  PROT 7.0  ALBUMIN 3.1*    Recent Labs Lab  02/13/16 2155  LIPASE 22   Cardiac Enzymes:  Recent Labs Lab 02/14/16 0200  TROPONINI 0.40*   CBG:  Recent Labs Lab 02/13/16 2123 02/13/16 2221 02/14/16 0245 02/14/16 0420  GLUCAP 86 100* 148* 136*   Urine analysis:    Component Value Date/Time   COLORURINE YELLOW 02/13/2016 2227   APPEARANCEUR CLOUDY* 02/13/2016 2227   LABSPEC 1.020 02/13/2016 2227   PHURINE 7.0 02/13/2016 2227   GLUCOSEU NEGATIVE 02/13/2016 2227   HGBUR MODERATE* 02/13/2016 2227   BILIRUBINUR NEGATIVE 02/13/2016 2227   KETONESUR NEGATIVE 02/13/2016 2227   PROTEINUR >300* 02/13/2016 2227   UROBILINOGEN 0.2 05/19/2015 1041   NITRITE NEGATIVE 02/13/2016 2227   LEUKOCYTESUR NEGATIVE 02/13/2016 2227   Radiology Studies: Dg Chest Portable 1 View  02/13/2016  CLINICAL DATA:  60 year old male with generalized weakness since 8 a.m. this morning. Atrial fibrillation. Nausea and vomiting. Right-sided chest pain. EXAM: PORTABLE CHEST 1 VIEW COMPARISON:  Chest x-ray 09/16/2015. FINDINGS: Lung volumes are low. Diffuse peribronchial cuffing. Patchy ill-defined airspace opacities in the mid to lower left hemithorax. Right lung appears clear. No pleural effusions. No evidence of pulmonary edema. Mild cardiomegaly. The patient is rotated to the left on today's exam, resulting in distortion of the mediastinal contours and reduced diagnostic sensitivity and specificity  for mediastinal pathology. Atherosclerosis in the thoracic aorta. Status post median sternotomy for CABG. IMPRESSION: 1. Peribronchial cuffing, concerning for an acute bronchitis. There also appears to be some ill-defined airspace disease throughout the mid to lower left lung which could indicate developing bronchopneumonia. Followup PA and lateral chest X-ray is recommended in 3-4 weeks following trial of antibiotic therapy to ensure resolution and exclude underlying malignancy. 2. Aortic atherosclerosis. 3. Mild cardiomegaly. Electronically Signed   By: Vinnie Langton M.D.   On: 02/13/2016 22:14      Scheduled Meds: . atorvastatin  80 mg Oral q1800  . cholecalciferol  1,000 Units Oral Daily  . clonazePAM  0.5 mg Oral QHS  . dabigatran  150 mg Oral BID  . ferrous gluconate  324 mg Oral Q breakfast  . fludrocortisone  0.1 mg Oral Q breakfast  . furosemide  40 mg Oral Daily  . gabapentin  300 mg Oral BID  . insulin aspart  0-9 Units Subcutaneous Q4H  . metoprolol tartrate  25 mg Oral BID  . PARoxetine  40 mg Oral Daily  . polyvinyl alcohol  1 drop Both Eyes BID  . sodium chloride flush  3 mL Intravenous Q12H   Continuous Infusions: . sodium chloride 75 mL/hr at 02/14/16 0256  . ceFEPime (MAXIPIME) IV 2 g (02/14/16 0618)  . vancomycin       LOS: 1 day    Time spent: 20 minutes    Faye Ramsay, MD [facility] Pager [phone]  If 7PM-7AM, please contact night-coverage [email or web address] Password [facility] 02/14/2016, 7:33 AM

## 2016-02-14 NOTE — Consult Note (Signed)
WOC wound consult note Reason for Consult: partial thickness tissue loss on right UE and LE, Partial and Full thickness wounds on LLE. Wound type: Trauma vs impaired circulatory Pressure Ulcer POA: No Measurement: Right UE with two partial thickness injuries, the largest of which measures 0.2cm roudn x 0.1cm deep.  Two similar areas on the RLE: two partial thickness areas in a 3cm x 3cm area, the largest of which measures 0.2cm round x 0.1cm.  LLE with 5 partial thickness injuries in a linear presentation on the pretibial region in a 5cm x 3cm, the largest of which measures 0.8cm x 1.2cm x 0.2cm.  Lateral to this linear presentation of partial thickness wounds is a circular full thickness wound with black eschar vs dried serum obscuring wound bed measuring 1.2cm round. Wound bed: As described above.  Partial thickness wounds are pink, moist Drainage (amount, consistency, odor) Scant to no exudate. Periwound: Intact, no periwound erythema, induration or warmth. Dressing procedure/placement/frequency: I will suggest covering the areas (both full and partial thickness) with our house silicone foam dressings.  Changes will be atraumatic (due to the silicone) and twice weekly on Tuesdays and Fridays after initially application today. Patient is OOB in chair today and is napping.  I will provide a pressure redistribution chair pad for use when OOB in chair and he may take this with him upon discharge. [place] nursing team will not follow, but will remain available to this patient, the nursing and medical teams.  Please re-consult if needed. Thanks, Maudie Flakes, MSN, RN, [place], Arther Abbott  Pager# [phone]

## 2016-02-14 NOTE — ED Notes (Signed)
Attempted report 

## 2016-02-14 NOTE — ED Notes (Signed)
Pt told the admitting Dr he needed to urinate. This tech assisted pt but he was not able to urinate. Pt is also requesting some water and a warm blanket. Pt is already covered with a sheet and a blanket. RN Emilie informed.

## 2016-02-14 NOTE — ED Notes (Signed)
Paged Dr. Tamala Julian regarding critical lactic level of 2.1

## 2016-02-14 NOTE — ED Notes (Signed)
Spoke with MD Doyle Askew about patient's Neuro complaints.

## 2016-02-14 NOTE — H&P (Signed)
History and Physical    [name] [id number] DOB: [birth year] DOA: 02/13/2016  Referring MD/NP/PA: Geronimo Boot, MD PCP: [name], MD  Patient coming from: Home  Chief Complaint: "Been sick"  HPI: [name] is a 60 y.o. male with medical history significant of mental retardation, COPD oxygen dependent, PAF, combined CHF last EF 55-60% with grade 2 diastolic dysfunction, HTN, DM, CAD s/p CABG, left carotid stenosis;  who presents with complaints of being sick with nausea and vomiting. Patient has mental retardation and is otherwise a poor historian. Much of patient's history is obtained from review of records as well as no family members available by telephone. Patient states that he's been sick all day and has had associated symptoms of diarrhea.   ED Course: Upon admission to the emergency department patient was evaluated and seen to have a temperature of 102.72F, pulse 72-75, respirations up to 26, blood pressures as high as 176/100, O2 saturations 97% on 2 L nasal cannula oxygen.Initial lab work revealed a WBC of 17.9, hemoglobin 11.8, platelets 340, troponin 0.13, and a lactic acid of 6.31.  Urinalysis was negative for signs of infection. Chest x-ray showed signs of possible developing bronchopulmonary pneumonia. Patient was started on vancomycin and Zosyn and has multiple comorbidities. TRH called to admit to stepdown.  Review of Systems: Complete review of systems was unable to be fully obtained  Past Medical History  Diagnosis Date  . Hypertension   . Intellectual disability     Sister helps to take care of him and takes him to appts  . Tobacco user     Smokes 1ppd for multiple years.  Quit after hosp 09/2010.  Marland Kitchen Anemia     History of Iron Def Anemia  . Anxiety   . Hyperlipidemia   . PAF (paroxysmal atrial fibrillation) ([place]) 06/2009    CHADS score 2 (HTN, DM), was not on coumadin, but now on Pradaxa for Afib  . Lung nodule   . Chronic low back  pain   . CVA (cerebral infarction) 09/2010    Bilateral with Left > Right  . Abdominal hernia     Chronic, not a good surgical candidate  . Carotid artery occlusion   . Obesity   . COPD (chronic obstructive pulmonary disease) ([place])   . Abscess, abdomen ([place]) 12/31/2010    Referred to Wound Care in 01/2011 because of multiple abd abscess with VERY large ventral hernia (please look at image of CT abd/pelvis 09/2010).  Because of hernia I was hesitant to I&D.      Marland Kitchen GERD (gastroesophageal reflux disease)   . Pneumonia     hx of  . Chronic diastolic CHF (congestive heart failure) (HCC)     takes Lasix  . Itching     all over body; pt scratches and has sores on bilateral arms and abdomen  . Hx of echocardiogram     Echo 5/16:  Mild LVH, EF 55%, indeterm. diast function, WMA could not be ruled out, MAC, trivial MR, mild LAE, normal RVF //  b. Echo 4/17: EF 55-60%, no RWMA, Gr 2 DD, Ao sclerosis, MAC, mild MS, mild LAE, PASP 55 mHg  . Myocardial infarction [facility]) 2016 ?    Heart attack  (  Per  pt. )  . History of nuclear stress test     Myoview 9/16:  Inferior, apical and inf-lateral ischemia; not gated; High Risk  . Shortness of breath dyspnea     with exertion  .  Diabetes mellitus   . Oxygen dependent     wears 2 liters at bedtime and when needed  . Wears glasses     Past Surgical History  Procedure Laterality Date  . Carotid arteriogram  10/2010    30% right ICA stenosis, 40% left ICA stenosis   . Transesophageal echocardiogram  09/2010    No ASD or PFO. EF 60-65%.  Normal systolic function. No evidence of thrombus.   . Transthoracic echocardiogram  09/2010     The cavity size was normal. Systolic function was vigorous.  EF 65-70%.  Normal wall funciton.   . Debridment of decubitus ulcer Right 02/13/2013  . I&d extremity Right 02/13/2013    Procedure: IRRIGATION AND DEBRIDEMENT FOOT ULCER;  Surgeon: Johnny Bridge, MD;  Location: [place];  Service: Orthopedics;  Laterality: Right;  PULSE  LAVAGE  . Cataract extraction, bilateral    . Arm surgery Left     as a child  . Amputation Right 03/29/2013    Procedure: AMPUTATION RAY;  Surgeon: Newt Minion, MD;  Location: [place];  Service: Orthopedics;  Laterality: Right;  Right Foot 3rd and Possible 4th Ray Amputation  . Multiple tooth extractions    . Amputation Right 04/23/2013    Procedure: AMPUTATION RIGHT MID-FOOT;  Surgeon: Newt Minion, MD;  Location: [place];  Service: Orthopedics;  Laterality: Right;  . Esophagogastroduodenoscopy (egd) with propofol N/A 06/10/2014    Procedure: ESOPHAGOGASTRODUODENOSCOPY (EGD) WITH PROPOFOL;  Surgeon: Jerene Bears, MD;  Location: [facility];  Service: Gastroenterology;  Laterality: N/A;  . Colonoscopy with propofol N/A 06/10/2014    Procedure: COLONOSCOPY WITH PROPOFOL;  Surgeon: Jerene Bears, MD;  Location: [facility];  Service: Gastroenterology;  Laterality: N/A;  . Givens capsule study N/A 07/09/2014    Procedure: GIVENS CAPSULE STUDY;  Surgeon: Jerene Bears, MD;  Location: [facility];  Service: Gastroenterology;  Laterality: N/A;  . Cardiac catheterization N/A 04/30/2015    Procedure: Left Heart Cath and Coronary Angiography;  Surgeon: Belva Crome, MD;  Location: [facility];  Service: Cardiovascular;  Laterality: N/A;  . Coronary artery bypass graft N/A 08/13/2015    Procedure: CORONARY ARTERY BYPASS GRAFTING (CABG), ON PUMP, TIMES THREE, USING LEFT INTERNAL MAMMARY ARTERY, RIGHT GREATER SAPHENOUS VEIN HARVESTED ENDOSCOPICALLY;  Surgeon: [name], MD;  Location: [facility];  Service: Open Heart Surgery;  Laterality: N/A;  . Tee without cardioversion N/A 08/13/2015    Procedure: TRANSESOPHAGEAL ECHOCARDIOGRAM (TEE);  Surgeon: [name], MD;  Location: [place];  Service: Open Heart Surgery;  Laterality: N/A;  . Endarterectomy Left 08/13/2015    Procedure: ENDARTERECTOMY CAROTID;  Surgeon: Angelia Mould, MD;  Location: [facility] OR;  Service: Vascular;  Laterality: Left;  . Carotid  endarterectomy Left 08-13-15    CEA     reports that he quit smoking about 5 years ago. His smoking use included Cigarettes and Pipe. He has a 42 pack-year smoking history. He quit smokeless tobacco use about 5 years ago. He reports that he does not drink alcohol or use illicit drugs.  Allergies  Allergen Reactions  . Penicillins Hives, Nausea And Vomiting, Swelling and Other (See Comments)    Has patient had a PCN reaction causing immediate rash, facial/tongue/throat swelling, SOB or lightheadedness with hypotension: YES Has patient had a PCN reaction causing severe rash involving mucus membranes or skin necrosis: No Has patient had a PCN reaction that required hospitalization No Has patient had a PCN reaction occurring  within the last 10 years: No If all of the above answers are "NO", then may proceed with Cephalosporin use.     Family History  Problem Relation Age of Onset  . Heart disease Mother   . Hypertension Sister   . Heart disease Brother   . Heart disease Father   . Peripheral vascular disease Father   . Heart disease Maternal Grandmother   . Heart attack Maternal Grandmother   . Breast cancer Maternal Grandmother   . Colon cancer Neg Hx   . Stroke Neg Hx   . Stomach cancer Maternal Uncle     Prior to Admission medications   Medication Sig Start Date End Date Taking? Authorizing Provider  acetaminophen (TYLENOL) 325 MG tablet Take 2 tablets (650 mg total) by mouth every 6 (six) hours as needed for mild pain. 08/21/15   Donielle Liston Alba, PA-C  aspirin EC 81 MG EC tablet Take 1 tablet (81 mg total) by mouth daily. 11/11/12   Kandis Nab, MD  atorvastatin (LIPITOR) 80 MG tablet Take 1 tablet (80 mg total) by mouth daily at 6 PM. 11/03/14   Janece Canterbury, MD  carboxymethylcellulose (REFRESH PLUS) 0.5 % SOLN Place 1 drop into both eyes 2 (two) times daily.    Historical Provider, MD  Cholecalciferol (VITAMIN D3) 1000 UNITS CAPS Take 1,000 Units by mouth daily.     Historical Provider, MD  clonazePAM (KLONOPIN) 0.5 MG tablet Take 1 tablet (0.5 mg total) by mouth at bedtime. 11/03/14   Janece Canterbury, MD  dabigatran (PRADAXA) 150 MG CAPS capsule Take 150 mg by mouth 2 (two) times daily.    Historical Provider, MD  ferrous gluconate (FERGON) 324 MG tablet Take 1 tablet (324 mg total) by mouth daily with breakfast. 08/21/15   Donielle Liston Alba, PA-C  FLUDROCORTISONE ACETATE PO Take 0.1 mg by mouth daily.    Historical Provider, MD  furosemide (LASIX) 40 MG tablet Take 40 mg by mouth daily.    Historical Provider, MD  gabapentin (NEURONTIN) 300 MG capsule Take 1 capsule (300 mg total) by mouth 2 (two) times daily. 11/03/14   Janece Canterbury, MD  HYDROcodone-acetaminophen (NORCO/VICODIN) 5-325 MG tablet Take 1 tablet by mouth every 6 (six) hours as needed for moderate pain. 08/21/15   Donielle Liston Alba, PA-C  Insulin Glargine (LANTUS) 100 UNIT/ML Solostar Pen Inject 10 Units into the skin every evening.    Historical Provider, MD  metFORMIN (GLUCOPHAGE) 1000 MG tablet Take 1,000 mg by mouth 2 (two) times daily with a meal.    Historical Provider, MD  metoprolol tartrate (LOPRESSOR) 25 MG tablet Take 25 mg by mouth 2 (two) times daily.    Historical Provider, MD  omeprazole (PRILOSEC) 20 MG capsule Take 20 mg by mouth daily.    Historical Provider, MD  PARoxetine (PAXIL) 40 MG tablet Take 40 mg by mouth every morning.    Historical Provider, MD    Physical Exam:    Constitutional: Chronically ill morbidly obese male who appears in mild distress Filed Vitals:   02/13/16 2142 02/13/16 2245 02/13/16 2300 02/13/16 2315  BP:  143/60 143/70 142/58  Pulse:  74 72 75  Temp: 102.5 F (39.2 C)     TempSrc: Rectal     Resp:  17 24 26   SpO2:  96% 100% 97%   Eyes: PERRL, lids and conjunctivae normal ENMT: Mucous membranes have recent vomitus. Posterior pharynx clear of any exudate or lesions. poor dentition.  Neck: normal, supple, no masses,  no  thyromegaly Respiratory: clear to auscultation bilaterally, no wheezing, no crackles. Normal respiratory effort. No accessory muscle use.  Cardiovascular: Regular rate and rhythm, no murmurs / rubs / gallops. No extremity edema. 2+ pedal pulses. No carotid bruits.  Abdomen: Patient with very large pannus and ventral hernia with  positive bowel sounds.  Musculoskeletal: no clubbing / cyanosis. No joint deformity upper and lower extremities. Good ROM, no contractures. Normal muscle tone.  Skin: Patient has multiple abrasions and open sores/ulcers of the upper and lower extremities. Neurologic: CN 2-12 grossly intact. Sensation intact, DTR normal. Strength 4+/5 in all 4.  Psychiatric: Patient alert and oriented to 3    Labs on Admission: I have personally reviewed following labs and imaging studies  CBC:  Recent Labs Lab 02/13/16 2155  WBC 17.9*  NEUTROABS 15.8*  HGB 11.8*  HCT 38.7*  MCV 78.8  PLT 123XX123   Basic Metabolic Panel:  Recent Labs Lab 02/13/16 2155  NA 138  K 3.9  CL 98*  CO2 26  GLUCOSE 92  BUN 15  CREATININE 0.93  CALCIUM 9.0   GFR: CrCl cannot be calculated (Unknown ideal weight.). Liver Function Tests:  Recent Labs Lab 02/13/16 2155  AST 32  ALT 14*  ALKPHOS 58  BILITOT 0.3  PROT 7.0  ALBUMIN 3.1*    Recent Labs Lab 02/13/16 2155  LIPASE 22   No results for input(s): AMMONIA in the last 168 hours. Coagulation Profile: No results for input(s): INR, PROTIME in the last 168 hours. Cardiac Enzymes: No results for input(s): CKTOTAL, CKMB, CKMBINDEX, TROPONINI in the last 168 hours. BNP (last 3 results) No results for input(s): PROBNP in the last 8760 hours. HbA1C: No results for input(s): HGBA1C in the last 72 hours. CBG:  Recent Labs Lab 02/13/16 2123 02/13/16 2221  GLUCAP 86 100*   Lipid Profile: No results for input(s): CHOL, HDL, LDLCALC, TRIG, CHOLHDL, LDLDIRECT in the last 72 hours. Thyroid Function Tests: No results for  input(s): TSH, T4TOTAL, FREET4, T3FREE, THYROIDAB in the last 72 hours. Anemia Panel: No results for input(s): VITAMINB12, FOLATE, FERRITIN, TIBC, IRON, RETICCTPCT in the last 72 hours. Urine analysis:    Component Value Date/Time   COLORURINE YELLOW 02/13/2016 2227   APPEARANCEUR CLOUDY* 02/13/2016 2227   LABSPEC 1.020 02/13/2016 2227   PHURINE 7.0 02/13/2016 2227   GLUCOSEU NEGATIVE 02/13/2016 2227   HGBUR MODERATE* 02/13/2016 2227   BILIRUBINUR NEGATIVE 02/13/2016 2227   KETONESUR NEGATIVE 02/13/2016 2227   PROTEINUR >300* 02/13/2016 2227   UROBILINOGEN 0.2 05/19/2015 1041   NITRITE NEGATIVE 02/13/2016 2227   LEUKOCYTESUR NEGATIVE 02/13/2016 2227   Sepsis Labs: No results found for this or any previous visit (from the past 240 hour(s)).   Radiological Exams on Admission: Dg Chest Portable 1 View  02/13/2016  CLINICAL DATA:  60 year old male with generalized weakness since 8 a.m. this morning. Atrial fibrillation. Nausea and vomiting. Right-sided chest pain. EXAM: PORTABLE CHEST 1 VIEW COMPARISON:  Chest x-ray 09/16/2015. FINDINGS: Lung volumes are low. Diffuse peribronchial cuffing. Patchy ill-defined airspace opacities in the mid to lower left hemithorax. Right lung appears clear. No pleural effusions. No evidence of pulmonary edema. Mild cardiomegaly. The patient is rotated to the left on today's exam, resulting in distortion of the mediastinal contours and reduced diagnostic sensitivity and specificity for mediastinal pathology. Atherosclerosis in the thoracic aorta. Status post median sternotomy for CABG. IMPRESSION: 1. Peribronchial cuffing, concerning for an acute bronchitis. There also appears to be some ill-defined airspace disease throughout  the mid to lower left lung which could indicate developing bronchopneumonia. Followup PA and lateral chest X-ray is recommended in 3-4 weeks following trial of antibiotic therapy to ensure resolution and exclude underlying malignancy. 2. Aortic  atherosclerosis. 3. Mild cardiomegaly. Electronically Signed   By: Vinnie Langton M.D.   On: 02/13/2016 22:14    EKG: Independently reviewed. Sinus rhythm with a right bundle-branch block  Assessment/Plan Sepsis secondary to community-acquired pneumonia/chronic respiratory failure: Acute. Patient with complaints of n/v with tachypnea, temperature of 102.82F, lactic acid of 6.31. Patient placed on empiric antibiotics and given 31ml/kg of IV fluids. - Admit to stepdown - Continuous pulse oximetry with nasal cannula oxygen keep O2 sats greater than 92% - Sepsis protocol initiated  - Follow-up blood cultures - Empiric antibiotics of vancomycin and cefepime given patient's multiple comorbidities - Trend lactic acid - DuoNeb prn SOB or wheeze  Nausea, vomiting, and diarrhea - Strict ins and outs - Zofran prn N/V - If symptoms do not improve after IV fluids and antibiotics consider need of CT imaging  Elevated Troponin- initial troponin 0.13. EKG showing no ischemic changes.  Demand Ischemia vs  ACS. Similar presentation with sepsis previously in the past with higher troponins noted. - Trend troponins - May warrant a consult to cardiology in a.m.  Paroxysmal atrial fibrillation: chads= 2 - Continue Pradaxa and  metoprolol  Congestive heart failure: Last EF noted to be 55-60% with grade 2 diastolic dysfunction in  Iron deficiency anemia: Hemoglobin on admission 11.8. Patient with known history of iron deficiency which is on iron supplementation. - Continue ferrous gluconate when able  Diabetes mellitus type II: Patient with a previous seen in the ED hypoglycemia 5/22. Last A1c was noted to be 5.8 in 08/2015. - Held metformin and Lantus for now - Hypoglycemic protocols  - CBGs every 4 hours for now as patient low normal blood glucose of 92 on admission  Ventral hernia/Abdominal hernia: Chronic.  Multiples skin wounds - Wound care consult  Adrenal insufficiency - Continue  fludrocortisone - consider need to stress dose with hydrocortisone   Anxiety/depression - Continue to Paxil and klonipin prn  Essential hypertension - Continue metoprolol   Hyperlipidemia - Continue Atorvastatin  DVT prophylaxis: Pradaxa Code Status: Full Family Communication:  No family present at bedside unable to be reached by telephone overnight. Disposition Plan: Undetermined Consults called:  None Admission status: Inpatient stepdown  Norval Morton MD [facility] Pager [phone]  If 7PM-7AM, please contact night-coverage [email or web address] Password [email or web address]  02/14/2016, 12:20 AM

## 2016-02-15 ENCOUNTER — Inpatient Hospital Stay (HOSPITAL_COMMUNITY): Payer: Medicare (Managed Care)

## 2016-02-15 DIAGNOSIS — R079 Chest pain, unspecified: Secondary | ICD-10-CM

## 2016-02-15 DIAGNOSIS — A419 Sepsis, unspecified organism: Secondary | ICD-10-CM | POA: Diagnosis not present

## 2016-02-15 LAB — BASIC METABOLIC PANEL
Anion gap: 5 (ref 5–15)
BUN: 15 mg/dL (ref 6–20)
CHLORIDE: 106 mmol/L (ref 101–111)
CO2: 28 mmol/L (ref 22–32)
CREATININE: 0.89 mg/dL (ref 0.61–1.24)
Calcium: 7.8 mg/dL — ABNORMAL LOW (ref 8.9–10.3)
Glucose, Bld: 129 mg/dL — ABNORMAL HIGH (ref 65–99)
Potassium: 3.5 mmol/L (ref 3.5–5.1)
SODIUM: 139 mmol/L (ref 135–145)

## 2016-02-15 LAB — URINE CULTURE: CULTURE: NO GROWTH

## 2016-02-15 LAB — TROPONIN I: Troponin I: 0.17 ng/mL (ref <0.03)

## 2016-02-15 LAB — CBC
HEMATOCRIT: 33.7 % — AB (ref 39.0–52.0)
Hemoglobin: 9.9 g/dL — ABNORMAL LOW (ref 13.0–17.0)
MCH: 23.3 pg — AB (ref 26.0–34.0)
MCHC: 29.4 g/dL — AB (ref 30.0–36.0)
MCV: 79.5 fL (ref 78.0–100.0)
PLATELETS: 220 10*3/uL (ref 150–400)
RBC: 4.24 MIL/uL (ref 4.22–5.81)
RDW: 14.9 % (ref 11.5–15.5)
WBC: 8.4 10*3/uL (ref 4.0–10.5)

## 2016-02-15 LAB — ECHOCARDIOGRAM COMPLETE
HEIGHTINCHES: 60 in
WEIGHTICAEL: 2996.8 [oz_av]

## 2016-02-15 LAB — GLUCOSE, CAPILLARY
GLUCOSE-CAPILLARY: 116 mg/dL — AB (ref 65–99)
GLUCOSE-CAPILLARY: 155 mg/dL — AB (ref 65–99)
GLUCOSE-CAPILLARY: 188 mg/dL — AB (ref 65–99)
Glucose-Capillary: 132 mg/dL — ABNORMAL HIGH (ref 65–99)
Glucose-Capillary: 192 mg/dL — ABNORMAL HIGH (ref 65–99)
Glucose-Capillary: 293 mg/dL — ABNORMAL HIGH (ref 65–99)

## 2016-02-15 NOTE — Care Management Important Message (Signed)
Important Message  Patient Details  Name: [name] MRN: [id number] Date of Birth: [birth year]   Medicare Important Message Given:  Yes    Loann Quill 02/15/2016, 8:58 AM

## 2016-02-15 NOTE — Progress Notes (Signed)
  Echocardiogram 2D Echocardiogram has been performed.  [name] 02/15/2016, 9:50 AM

## 2016-02-15 NOTE — Care Management Note (Addendum)
Case Management Note  Patient Details  Name: [name] MRN: [id number] Date of Birth: 09/23/1955  Subjective/Objective:        Pt presented  with complaints of being sick with nausea and vomiting. Patient has hx of mental retardation, COPD oxygen dependent, PAF, combined CHF last EF 55-60% with grade 2 diastolic dysfunction, HTN, DM, CAD s/p CABG and left carotid stenosis. Upon admission to the emergency department patient was evaluated and seen to have a temperature of 102.60F, pulse 72-75, respirations up to 26, blood pressures as high as 176/100, O2 saturations 97% on 2 L nasal cannula oxygen.Initial lab work revealed a WBC of 17.9, hemoglobin 11.8, platelets 340, troponin 0.13, and a lactic acid of 6.31. Urinalysis was negative for signs of infection. Chest x-ray showed signs of possible developing bronchopulmonary pneumonia. Patient was started on vancomycin and Cefepime. Pt has Services via [place] [facility]. RN that oversees his services is Debbie Par. CM did place a call to her in reference to disposition needs and services that are provided. Awaiting call back.   Action/Plan: CM will continue to monitor for additional needs.    Expected Discharge Date:                  Expected Discharge Plan:  [place] [facility])  In-House Referral:     Discharge planning Services  CM Consult  Post Acute Care Choice:    Choice offered to:     DME Arranged:    DME Agency:     HH Arranged:    HH Agency:     Status of Service:  In process, will continue to follow  If discussed at Long Length of Stay Meetings, dates discussed:    Additional Comments: 1356 02-18-16 Jacqlyn Krauss, RN,BSN [phone] Pt plan for d/c today. If pt to need transportation and family can not provide, Claudia Desanctis will be available until 5:00 pm. After 5:00 pm family will have to provide transportation. CSW Earnest Bailey can be reached at [phone] and Buel Ream can be reached at  [phone]. No further needs from CM at this time.   Bethena Roys, RN 02/15/2016, 1:11 PM

## 2016-02-15 NOTE — Progress Notes (Signed)
MD notified of pt converting to a-fib in the 120s-150s this morning around 05:50. Pt currently flipped back to NSR/SB. VS stable & charted. Will continue to monitor the pt.Hoover Brunette, RN

## 2016-02-15 NOTE — Progress Notes (Addendum)
Patient ID: [name], male   DOB: April 22, 1956, 60 y.o.   MRN: [id number]   PROGRESS NOTE    [name]  [id number] DOB: 1956-06-15 DOA: 02/13/2016  PCP: [name], MD   Brief Narrative:  60 y.o. male with medical history significant of mental retardation, COPD oxygen dependent, PAF, combined CHF last EF 55-60% with grade 2 diastolic dysfunction, HTN, DM, CAD s/p CABG, left carotid stenosis; who presented with complaints of being sick with nausea and vomiting.   ED Course: Upon admission to the emergency department patient was evaluated and seen to have a temperature of 102.65F, pulse 72-75, respirations up to 26, blood pressures as high as 176/100, O2 saturations 97% on 2 L nasal cannula oxygen. Initial lab work revealed a WBC of 17.9, hemoglobin 11.8, platelets 340, troponin 0.13, and a lactic acid of 6.31. Urinalysis was negative for signs of infection. Chest x-ray showed signs of possible developing bronchopulmonary pneumonia. Patient was started on vancomycin and Zosyn and TRH asked to admit for further evaluation.   Assessment and plan: Sepsis secondary to community-acquired pneumonia/chronic respiratory failure oxygen dependent  - pt clinically improving on Vancomycin and Maxipime, will continue same regimen - no fevers overnight, pt maintaining oxygen saturations at target range for now - continue to monitor  - follow up on blood cultures  - allow bronchodilators as needed   Nausea, vomiting, and diarrhea - possibly related the above - now resolved - advance diet   Elevated Troponin - initial troponin 0.13. EKG showing no ischemic changes. - likely related to demand ischemia in the setting of sepsis - pt denies chest pain  - cardiology consulted, recommended repeat ECHO and will officially consult if needed   Paroxysmal atrial fibrillation: CHADS2 score = 2 - Continue Pradaxa and metoprolol  Congestive heart failure, chronic  - Last EF  noted to be 55-60% with grade 2 diastolic dysfunction - weight 84 kg this AM  Iron deficiency anemia - no signs of bleeding - CBC in AM  Diabetes mellitus type II - Last A1c was noted to be 5.8 in 08/2015. - Held metformin and Lantus for now - Hypoglycemic protocols  - CBGs every 4 hours for now as patient low normal blood glucose of 92 on admission  Ventral hernia/Abdominal hernia - Chronic.  Multiples skin wounds - Wound care consulted  Adrenal insufficiency - Continue fludrocortisone - consider need to stress dose with hydrocortisone   Anxiety/depression - Continue to Paxil and klonipin prn  Essential hypertension - Continue metoprolol   Hyperlipidemia - Continue Atorvastatin  DVT prophylaxis: Pradaxa Code Status: Full Family Communication: No family present at bedside unable to be reached by telephone Disposition Plan: Undetermined  Objective: Filed Vitals:   02/15/16 1048 02/15/16 1200 02/15/16 1550 02/15/16 2034  BP: 136/65 139/80 122/95 150/52  Pulse: 58 56 55 58  Temp:  97.5 F (36.4 C) 97.5 F (36.4 C) 98.5 F (36.9 C)  TempSrc:  Oral Oral Oral  Resp: 16 18 18 16   Height:      Weight:      SpO2: 98% 99% 99% 96%    Intake/Output Summary (Last 24 hours) at 02/15/16 2039 Last data filed at 02/15/16 1700  Gross per 24 hour  Intake   1140 ml  Output   1313 ml  Net   -173 ml   Filed Weights   02/14/16 0910 02/15/16 0418  Weight: 84.006 kg (185 lb 3.2 oz) 84.959 kg (187 lb 4.8 oz)  Examination:  General exam: Appears calm and comfortable  Respiratory system: Respiratory effort normal. Diminished breath sounds at bases with rhonchi  Cardiovascular system: S1 & S2 heard, RRR. No JVD, rubs, gallops or clicks. No pedal edema. Gastrointestinal system: Abdomen is nondistended, soft and nontender. No organomegaly or masses felt.  Central nervous system: Alert and oriented. No focal neurological deficits.  Data Reviewed: I have personally reviewed  following labs and imaging studies  CBC:  Recent Labs Lab 02/13/16 2155 02/14/16 0420 02/15/16 0406  WBC 17.9* 13.8* 8.4  NEUTROABS 15.8*  --   --   HGB 11.8* 10.9* 9.9*  HCT 38.7* 36.3* 33.7*  MCV 78.8 79.6 79.5  PLT 340 269 [id number]   Basic Metabolic Panel:  Recent Labs Lab 02/13/16 2155 02/14/16 0420 02/15/16 0406  NA 138 137 139  K 3.9 3.9 3.5  CL 98* 100* 106  CO2 26 27 28   GLUCOSE 92 141* 129*  BUN 15 14 15   CREATININE 0.93 0.85 0.89  CALCIUM 9.0 8.0* 7.8*   Liver Function Tests:  Recent Labs Lab 02/13/16 2155  AST 32  ALT 14*  ALKPHOS 58  BILITOT 0.3  PROT 7.0  ALBUMIN 3.1*    Recent Labs Lab 02/13/16 2155  LIPASE 22   Cardiac Enzymes:  Recent Labs Lab 02/14/16 0721 02/14/16 1221 02/14/16 1656 02/14/16 2148 02/15/16 0406  TROPONINI 0.62* 0.49* 0.29* 0.19* 0.17*   CBG:  Recent Labs Lab 02/14/16 2354 02/15/16 0408 02/15/16 0730 02/15/16 1109 02/15/16 1626  GLUCAP 155* 116* 132* 192* 188*   Urine analysis:    Component Value Date/Time   COLORURINE YELLOW 02/13/2016 2227   APPEARANCEUR CLOUDY* 02/13/2016 2227   LABSPEC 1.020 02/13/2016 2227   PHURINE 7.0 02/13/2016 2227   GLUCOSEU NEGATIVE 02/13/2016 2227   HGBUR MODERATE* 02/13/2016 2227   BILIRUBINUR NEGATIVE 02/13/2016 2227   KETONESUR NEGATIVE 02/13/2016 2227   PROTEINUR >300* 02/13/2016 2227   UROBILINOGEN 0.2 05/19/2015 1041   NITRITE NEGATIVE 02/13/2016 2227   LEUKOCYTESUR NEGATIVE 02/13/2016 2227   Radiology Studies: Dg Chest Portable 1 View  02/13/2016  CLINICAL DATA:  60 year old male with generalized weakness since 8 a.m. this morning. Atrial fibrillation. Nausea and vomiting. Right-sided chest pain. EXAM: PORTABLE CHEST 1 VIEW COMPARISON:  Chest x-ray 09/16/2015. FINDINGS: Lung volumes are low. Diffuse peribronchial cuffing. Patchy ill-defined airspace opacities in the mid to lower left hemithorax. Right lung appears clear. No pleural effusions. No evidence of  pulmonary edema. Mild cardiomegaly. The patient is rotated to the left on today's exam, resulting in distortion of the mediastinal contours and reduced diagnostic sensitivity and specificity for mediastinal pathology. Atherosclerosis in the thoracic aorta. Status post median sternotomy for CABG. IMPRESSION: 1. Peribronchial cuffing, concerning for an acute bronchitis. There also appears to be some ill-defined airspace disease throughout the mid to lower left lung which could indicate developing bronchopneumonia. Followup PA and lateral chest X-ray is recommended in 3-4 weeks following trial of antibiotic therapy to ensure resolution and exclude underlying malignancy. 2. Aortic atherosclerosis. 3. Mild cardiomegaly. Electronically Signed   By: Vinnie Langton M.D.   On: 02/13/2016 22:14      Scheduled Meds: . atorvastatin  80 mg Oral q1800  . ceFEPime (MAXIPIME) IV  2 g Intravenous Q8H  . cholecalciferol  1,000 Units Oral Daily  . clonazePAM  0.5 mg Oral QHS  . dabigatran  150 mg Oral BID  . ferrous gluconate  324 mg Oral Q breakfast  . fludrocortisone  0.1 mg Oral Q  breakfast  . furosemide  40 mg Oral Daily  . gabapentin  300 mg Oral BID  . insulin aspart  0-9 Units Subcutaneous Q4H  . metoprolol tartrate  25 mg Oral BID  . PARoxetine  40 mg Oral Daily  . polyvinyl alcohol  1 drop Both Eyes BID  . sodium chloride flush  3 mL Intravenous Q12H  . vancomycin  1,000 mg Intravenous Q12H   Continuous Infusions:     LOS: 2 days    Time spent: 20 minutes    Faye Ramsay, MD [facility] Pager [phone]  If 7PM-7AM, please contact night-coverage [email or web address] Password [facility] 02/15/2016, 8:39 PM

## 2016-02-16 LAB — GLUCOSE, CAPILLARY
GLUCOSE-CAPILLARY: 122 mg/dL — AB (ref 65–99)
GLUCOSE-CAPILLARY: 140 mg/dL — AB (ref 65–99)
GLUCOSE-CAPILLARY: 157 mg/dL — AB (ref 65–99)
Glucose-Capillary: 150 mg/dL — ABNORMAL HIGH (ref 65–99)
Glucose-Capillary: 212 mg/dL — ABNORMAL HIGH (ref 65–99)
Glucose-Capillary: 267 mg/dL — ABNORMAL HIGH (ref 65–99)

## 2016-02-16 NOTE — Progress Notes (Signed)
Patient ID: [name], male   DOB: [birth year], 60 y.o.   MRN: [id number]   PROGRESS NOTE    [name]  [id number] DOB: 11/27/1955 DOA: 02/13/2016  PCP: [name], MD   Brief Narrative:  60 y.o. male with medical history significant of mental retardation, COPD oxygen dependent, PAF, combined CHF last EF 55-60% with grade 2 diastolic dysfunction, HTN, DM, CAD s/p CABG, left carotid stenosis; who presented with complaints of being sick with nausea and vomiting.   ED Course: Upon admission to the emergency department patient was evaluated and seen to have a temperature of 102.66F, pulse 72-75, respirations up to 26, blood pressures as high as 176/100, O2 saturations 97% on 2 L nasal cannula oxygen. Initial lab work revealed a WBC of 17.9, hemoglobin 11.8, platelets 340, troponin 0.13, and a lactic acid of 6.31. Urinalysis was negative for signs of infection. Chest x-ray showed signs of possible developing bronchopulmonary pneumonia. Patient was started on vancomycin and Zosyn and TRH asked to admit for further evaluation.   Assessment and plan: Sepsis secondary to community-acquired pneumonia/chronic respiratory failure oxygen dependent  - pt clinically improving on Vancomycin and Maxipime, will continue same regimen, today is day #3 - no fevers overnight, pt maintaining oxygen saturations at target range for now - plan to transition to oral ABX in AM if no fevers  - continue to monitor  - follow up on blood cultures  - allow bronchodilators as needed   Nausea, vomiting, and diarrhea - possibly related the above - now resolved - advanced diet and pt tolerating well   Elevated Troponin - initial troponin 0.13. EKG showing no ischemic changes. - likely related to demand ischemia in the setting of sepsis - troponins trending down  - pt denies chest pain  - cardiology consulted, recommended repeat ECHO which essential shows EF [id number] and same diastolic CHF  -  will ask cardiology to review and provide any additional recommendations   Paroxysmal atrial fibrillation: CHADS2 score = 2 - Continue Pradaxa and metoprolol  Congestive heart failure, chronic  - Last EF noted to be 55-60% with grade 2 diastolic dysfunction - weight 84 kg this AM  Iron deficiency anemia - no signs of bleeding - CBC in AM  Diabetes mellitus type II - Last A1c was noted to be 5.8 in 08/2015. - Held metformin and Lantus for now - Hypoglycemic protocols  - CBGs every 4 hours for now as patient low normal blood glucose of 92 on admission  Ventral hernia/Abdominal hernia - Chronic.  Multiples skin wounds - partial thickness tissue loss on right UE and LE, Partial and Full thickness wounds on LLE. - Right UE with two partial thickness injuries, the largest of which measures 0.2cm round x 0.1cm deep.  - Two similar areas on the RLE: two partial thickness areas in a 3cm x 3cm area, the largest of which measures 0.2cm round x 0.1cm. - LLE with 5 partial thickness injuries on the pretibial region in a 5cm x 3cm, the largest measures 0.8 x 1.2 x 0.2cm.  - recommend covering the areas (both full and partial thickness) with our house silicone foam dressings - twice weekly on Tuesdays and Fridays - appreciate [place] team following   Adrenal insufficiency - Continue fludrocortisone - consider need to stress dose with hydrocortisone   Anxiety/depression - Continue to Paxil and klonipin prn  Essential hypertension - Continue metoprolol   Hyperlipidemia - Continue Atorvastatin  DVT prophylaxis: Pradaxa Code Status: Full Family  Communication: No family present at bedside unable to be reached by telephone Disposition Plan: Undetermined  Consultants: cardiology over the phone initially and repaged to discuss ECHO results, awaiting call back  ABX: vancomycin and maxipime 7/9 --> Procedures: none   Objective: Filed Vitals:   02/16/16 0440 02/16/16 0748 02/16/16 1047  02/16/16 1553  BP: 129/56 149/70 153/65 147/55  Pulse: 55 53 52 56  Temp: 97.5 F (36.4 C) 98.5 F (36.9 C)  98 F (36.7 C)  TempSrc: Oral Oral  Oral  Resp: 16 14 20 16   Height:      Weight: 84.913 kg (187 lb 3.2 oz)     SpO2: 96% 96% 97% 98%    Intake/Output Summary (Last 24 hours) at 02/16/16 1725 Last data filed at 02/16/16 1700  Gross per 24 hour  Intake   1559 ml  Output   2525 ml  Net   -966 ml   Filed Weights   02/14/16 0910 02/15/16 0418 02/16/16 0440  Weight: 84.006 kg (185 lb 3.2 oz) 84.959 kg (187 lb 4.8 oz) 84.913 kg (187 lb 3.2 oz)    Examination:  General exam: Appears calm and comfortable  Respiratory system: Respiratory effort normal. Diminished breath sounds at bases with rhonchi  Cardiovascular system: IRRR, bradycardic. No JVD, rubs, gallops or clicks. No pedal edema. Gastrointestinal system: Abdomen is nondistended, soft and nontender. No organomegaly or masses felt.  Central nervous system: Alert and oriented. No focal neurological deficits.  Data Reviewed: I have personally reviewed following labs and imaging studies  CBC:  Recent Labs Lab 02/13/16 2155 02/14/16 0420 02/15/16 0406  WBC 17.9* 13.8* 8.4  NEUTROABS 15.8*  --   --   HGB 11.8* 10.9* 9.9*  HCT 38.7* 36.3* 33.7*  MCV 78.8 79.6 79.5  PLT 340 269 [id number]   Basic Metabolic Panel:  Recent Labs Lab 02/13/16 2155 02/14/16 0420 02/15/16 0406  NA 138 137 139  K 3.9 3.9 3.5  CL 98* 100* 106  CO2 26 27 28   GLUCOSE 92 141* 129*  BUN 15 14 15   CREATININE 0.93 0.85 0.89  CALCIUM 9.0 8.0* 7.8*   Liver Function Tests:  Recent Labs Lab 02/13/16 2155  AST 32  ALT 14*  ALKPHOS 58  BILITOT 0.3  PROT 7.0  ALBUMIN 3.1*    Recent Labs Lab 02/13/16 2155  LIPASE 22   Cardiac Enzymes:  Recent Labs Lab 02/14/16 0721 02/14/16 1221 02/14/16 1656 02/14/16 2148 02/15/16 0406  TROPONINI 0.62* 0.49* 0.29* 0.19* 0.17*   CBG:  Recent Labs Lab 02/16/16 0019 02/16/16 0510  02/16/16 0747 02/16/16 1135 02/16/16 1648  GLUCAP 157* 122* 140* 150* 212*   Urine analysis:    Component Value Date/Time   COLORURINE YELLOW 02/13/2016 2227   APPEARANCEUR CLOUDY* 02/13/2016 2227   LABSPEC 1.020 02/13/2016 2227   PHURINE 7.0 02/13/2016 2227   GLUCOSEU NEGATIVE 02/13/2016 2227   HGBUR MODERATE* 02/13/2016 2227   BILIRUBINUR NEGATIVE 02/13/2016 2227   KETONESUR NEGATIVE 02/13/2016 2227   PROTEINUR >300* 02/13/2016 2227   UROBILINOGEN 0.2 05/19/2015 1041   NITRITE NEGATIVE 02/13/2016 2227   LEUKOCYTESUR NEGATIVE 02/13/2016 2227   Radiology Studies: No results found.    Scheduled Meds: . atorvastatin  80 mg Oral q1800  . ceFEPime (MAXIPIME) IV  2 g Intravenous Q8H  . cholecalciferol  1,000 Units Oral Daily  . clonazePAM  0.5 mg Oral QHS  . dabigatran  150 mg Oral BID  . ferrous gluconate  324 mg Oral Q breakfast  .  fludrocortisone  0.1 mg Oral Q breakfast  . furosemide  40 mg Oral Daily  . gabapentin  300 mg Oral BID  . insulin aspart  0-9 Units Subcutaneous Q4H  . metoprolol tartrate  25 mg Oral BID  . PARoxetine  40 mg Oral Daily  . polyvinyl alcohol  1 drop Both Eyes BID  . sodium chloride flush  3 mL Intravenous Q12H  . vancomycin  1,000 mg Intravenous Q12H   Continuous Infusions:     LOS: 3 days    Time spent: 20 minutes    Faye Ramsay, MD [facility] Pager [phone]  If 7PM-7AM, please contact night-coverage [email or web address] Password [email or web address] 02/16/2016, 5:25 PM

## 2016-02-16 NOTE — Progress Notes (Signed)
Inpatient Diabetes Program Recommendations  AACE/ADA: New Consensus Statement on Inpatient Glycemic Control (2015)  Target Ranges:  Prepandial:   less than 140 mg/dL      Peak postprandial:   less than 180 mg/dL (1-2 hours)      Critically ill patients:  140 - 180 mg/dL   Lab Results  Component Value Date   GLUCAP 140* 02/16/2016   HGBA1C 5.8* 08/11/2015    Review of Glycemic Control  Results for HANEY, SIMONET (MRN [id number]) as of 02/16/2016 10:41  Ref. Range 02/15/2016 16:26 02/15/2016 20:33 02/16/2016 00:19 02/16/2016 05:10 02/16/2016 07:47  Glucose-Capillary Latest Ref Range: 65-99 mg/dL 188 (H) 293 (H) 157 (H) 122 (H) 140 (H)    Diabetes history: Type 2 Outpatient Diabetes medications: Lantus 10 units qday, Metformin 1000mg  bid Current orders for Inpatient glycemic control: Novolog 0-9 units q4h  Inpatient Diabetes Program Recommendations: Since the patient is eating, consider changing Novolog to tid and add Novolog 0-5 units qhs  Gentry Fitz, RN, [place], [place], CDE Diabetes Coordinator Inpatient Diabetes Program  [phone] (Team Pager) [phone] ([place]) 02/16/2016 10:42 AM

## 2016-02-16 NOTE — Plan of Care (Signed)
Problem: Education: Goal: Knowledge of [facility] Education information/materials will improve Outcome: Progressing Pt educated on pneumonia, antibiotics, getting up moving, coughing and deep breathing. Pt demonstrates understanding. Pt has no complaints at this time. Resting up in the chair comfortably.

## 2016-02-17 LAB — BASIC METABOLIC PANEL
ANION GAP: 9 (ref 5–15)
BUN: 13 mg/dL (ref 6–20)
CHLORIDE: 102 mmol/L (ref 101–111)
CO2: 28 mmol/L (ref 22–32)
Calcium: 8.5 mg/dL — ABNORMAL LOW (ref 8.9–10.3)
Creatinine, Ser: 0.83 mg/dL (ref 0.61–1.24)
GFR calc Af Amer: >60 mL/min (ref >60)
GLUCOSE: 140 mg/dL — AB (ref 65–99)
POTASSIUM: 3.7 mmol/L (ref 3.5–5.1)
SODIUM: 139 mmol/L (ref 135–145)

## 2016-02-17 LAB — GLUCOSE, CAPILLARY
GLUCOSE-CAPILLARY: 140 mg/dL — AB (ref 65–99)
GLUCOSE-CAPILLARY: 163 mg/dL — AB (ref 65–99)
GLUCOSE-CAPILLARY: 187 mg/dL — AB (ref 65–99)
Glucose-Capillary: 134 mg/dL — ABNORMAL HIGH (ref 65–99)
Glucose-Capillary: 167 mg/dL — ABNORMAL HIGH (ref 65–99)
Glucose-Capillary: 208 mg/dL — ABNORMAL HIGH (ref 65–99)
Glucose-Capillary: 364 mg/dL — ABNORMAL HIGH (ref 65–99)

## 2016-02-17 LAB — CBC
HEMATOCRIT: 33.6 % — AB (ref 39.0–52.0)
Hemoglobin: 10.1 g/dL — ABNORMAL LOW (ref 13.0–17.0)
MCH: 23.7 pg — AB (ref 26.0–34.0)
MCHC: 30.1 g/dL (ref 30.0–36.0)
MCV: 78.9 fL (ref 78.0–100.0)
Platelets: 221 10*3/uL (ref 150–400)
RBC: 4.26 MIL/uL (ref 4.22–5.81)
RDW: 15 % (ref 11.5–15.5)
WBC: 8.6 10*3/uL (ref 4.0–10.5)

## 2016-02-17 MED ORDER — LEVOFLOXACIN 500 MG PO TABS
500.0000 mg | ORAL_TABLET | Freq: Every day | ORAL | Status: DC
  Administered 2016-02-17 – 2016-02-19 (×3): 500 mg via ORAL
  Filled 2016-02-17 (×3): qty 1

## 2016-02-17 NOTE — Progress Notes (Signed)
Patient ID: [name], male   DOB: 1956/02/17, 60 y.o.   MRN: [id number]   PROGRESS NOTE    [name]  [id number] DOB: 1955-08-29 DOA: 02/13/2016  PCP: [name], MD   Brief Narrative:  60 y.o. male with medical history significant of mental retardation, COPD oxygen dependent, PAF, combined CHF last EF 55-60% with grade 2 diastolic dysfunction, HTN, DM, CAD s/p CABG, left carotid stenosis; who presented with complaints of being sick with nausea and vomiting.   ED Course: Upon admission to the emergency department patient was evaluated and seen to have a temperature of 102.63F, pulse 72-75, respirations up to 26, blood pressures as high as 176/100, O2 saturations 97% on 2 L nasal cannula oxygen. Initial lab work revealed a WBC of 17.9, hemoglobin 11.8, platelets 340, troponin 0.13, and a lactic acid of 6.31. Urinalysis was negative for signs of infection. Chest x-ray showed signs of possible developing bronchopulmonary pneumonia. Patient was started on vancomycin and Zosyn and TRH asked to admit for further evaluation.   Assessment and plan: Sepsis secondary to community-acquired pneumonia/chronic respiratory failure oxygen dependent  - pt clinically improving on Vancomycin and Maxipime, will continue same regimen, today is day #4 - no fevers overnight, pt maintaining oxygen saturations at target range for now - transition to oral ABX Levaquin to complete therapy  - continue to monitor  - allow bronchodilators as needed   Nausea, vomiting, and diarrhea - possibly related the above - now resolved - advanced diet and pt tolerating well   Elevated Troponin - initial troponin 0.13. EKG showing no ischemic changes. - likely related to demand ischemia in the setting of sepsis - troponins trending down  - pt denies chest pain  - cardiology consulted, recommended repeat ECHO which essentially shows EF [id number] and same diastolic CHF   Paroxysmal atrial  fibrillation: CHADS2 score = 2 - Continue Pradaxa andmetoprolol  Congestive heart failure, chronic  - Last EF noted to be 55-60% with grade 2 diastolic dysfunction - weight 84 kg this AM  Iron deficiency anemia - no signs of bleeding - CBC in AM  Diabetes mellitus type II - Last A1c was noted to be 5.8 in 08/2015. - CBG's in 100's  - continue with SSI   Ventral hernia/Abdominal hernia - Chronic.  Multiples skin wounds - partial thickness tissue loss on right UE and LE, Partial and Full thickness wounds on LLE. - Right UE with two partial thickness injuries, the largest of which measures 0.2cm round x 0.1cm deep.  - Two similar areas on the RLE: two partial thickness areas in a 3cm x 3cm area, the largest of which measures 0.2cm round x 0.1cm. - LLE with 5 partial thickness injuries on the pretibial region in a 5cm x 3cm, the largest measures 0.8 x 1.2 x 0.2cm.  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  - Continue fludrocortisone  Anxiety/depression - Continue to Paxil and klonipin prn  Essential hypertension - Continue metoprolol   Hyperlipidemia - Continue Atorvastatin  DVT prophylaxis: Pradaxa Code Status: Full Family Communication: No family present at bedside unable to be reached by telephone Disposition Plan: in 1-2 days   Consultants: cardiology over the phone initially and repaged to discuss ECHO results, awaiting call back  ABX: vancomycin and maxipime 7/9 --> Procedures: none  Objective: Filed Vitals:   02/17/16 0410 02/17/16 0545 02/17/16 0752 02/17/16 1140  BP: 144/62  148/57   Pulse: 51  54   Temp: 98.4 F (36.9 C)  97.9 F (36.6 C) 97.6 F (36.4 C)  TempSrc: Oral  Oral Oral  Resp: 18     Height:      Weight:  84.641 kg (186 lb 9.6 oz)    SpO2: 95%  100%      Intake/Output Summary (Last 24 hours) at 02/17/16 1211 Last data filed at 02/17/16 1141  Gross per 24 hour  Intake   1423 ml  Output   2850 ml  Net  -1427 ml   Filed Weights   02/15/16 0418 02/16/16 0440 02/17/16 0545  Weight: 84.959 kg (187 lb 4.8 oz) 84.913 kg (187 lb 3.2 oz) 84.641 kg (186 lb 9.6 oz)    Examination:  General exam: Appears calm and comfortable  Respiratory system: Respiratory effort normal. Diminished breath sounds at bases with rhonchi  Cardiovascular system: IRRR, bradycardic. No JVD, rubs, gallops or clicks. No pedal edema. Gastrointestinal system: Abdomen is nondistended, soft and nontender. No organomegaly or masses felt.  Central nervous system: Alert and oriented. No focal neurological deficits.  Data Reviewed: I have personally reviewed following labs and imaging studies  CBC:  Recent Labs Lab 02/13/16 2155 02/14/16 0420 02/15/16 0406 02/17/16 0459  WBC 17.9* 13.8* 8.4 8.6  NEUTROABS 15.8*  --   --   --   HGB 11.8* 10.9* 9.9* 10.1*  HCT 38.7* 36.3* 33.7* 33.6*  MCV 78.8 79.6 79.5 78.9  PLT 340 269 220 [id number]   Basic Metabolic Panel:  Recent Labs Lab 02/13/16 2155 02/14/16 0420 02/15/16 0406 02/17/16 0459  NA 138 137 139 139  K 3.9 3.9 3.5 3.7  CL 98* 100* 106 102  CO2 26 27 28 28   GLUCOSE 92 141* 129* 140*  BUN 15 14 15 13   CREATININE 0.93 0.85 0.89 0.83  CALCIUM 9.0 8.0* 7.8* 8.5*   Liver Function Tests:  Recent Labs Lab 02/13/16 2155  AST 32  ALT 14*  ALKPHOS 58  BILITOT 0.3  PROT 7.0  ALBUMIN 3.1*    Recent Labs Lab 02/13/16 2155  LIPASE 22   Cardiac Enzymes:  Recent Labs Lab 02/14/16 0721 02/14/16 1221 02/14/16 1656 02/14/16 2148 02/15/16 0406  TROPONINI 0.62* 0.49* 0.29* 0.19* 0.17*   CBG:  Recent Labs Lab 02/16/16 2048 02/17/16 0017 02/17/16 0408 02/17/16 0750 02/17/16 1124  GLUCAP 267* 167* 134* 163* 187*   Urine analysis:    Component Value Date/Time   COLORURINE YELLOW 02/13/2016  2227   APPEARANCEUR CLOUDY* 02/13/2016 2227   LABSPEC 1.020 02/13/2016 2227   PHURINE 7.0 02/13/2016 2227   GLUCOSEU NEGATIVE 02/13/2016 2227   HGBUR MODERATE* 02/13/2016 2227   [place] 02/13/2016 2227   KETONESUR NEGATIVE 02/13/2016 2227   PROTEINUR >300* 02/13/2016 2227   UROBILINOGEN 0.2 05/19/2015 1041   NITRITE NEGATIVE 02/13/2016 2227   LEUKOCYTESUR NEGATIVE 02/13/2016 2227   Radiology Studies: No results found.    Scheduled Meds: . atorvastatin  80 mg Oral q1800  . ceFEPime (MAXIPIME) IV  2 g Intravenous Q8H  . cholecalciferol  1,000 Units Oral Daily  . clonazePAM  0.5 mg Oral QHS  . dabigatran  150 mg Oral BID  . ferrous gluconate  324 mg Oral Q breakfast  . fludrocortisone  0.1 mg Oral Q breakfast  . furosemide  40 mg Oral Daily  . gabapentin  300 mg  Oral BID  . insulin aspart  0-9 Units Subcutaneous Q4H  . metoprolol tartrate  25 mg Oral BID  . PARoxetine  40 mg Oral Daily  . polyvinyl alcohol  1 drop Both Eyes BID  . sodium chloride flush  3 mL Intravenous Q12H  . vancomycin  1,000 mg Intravenous Q12H   Continuous Infusions:     LOS: 4 days    Time spent: 20 minutes    Faye Ramsay, MD [facility] Pager [phone]  If 7PM-7AM, please contact night-coverage [email or web address] Password [facility] 02/17/2016, 12:11 PM

## 2016-02-17 NOTE — Plan of Care (Signed)
Problem: Activity: Goal: Risk for activity intolerance will decrease Outcome: Progressing Pt ambulated in hall X2 250 ft. Tolerated well. Used from wheel walker. Transitioned to PO levaquin. Educated on medication changes. Resting comfortably in the chair at this time.

## 2016-02-17 NOTE — Progress Notes (Signed)
Report received via Hetty [place], in patient's room, using SBAR format, reviewed orders, labs, VS, meds, tests and patient's general condition, assumed care of patient.

## 2016-02-17 NOTE — Evaluation (Signed)
Physical Therapy Evaluation Patient Details Name: [name] MRN: [id number] DOB: Jul 20, 1956 Today's Date: 02/17/2016   History of Present Illness  60 y.o. male with medical history significant of mental retardation, COPD oxygen dependent, PAF, combined CHF last EF 55-60% with grade 2 diastolic dysfunction, HTN, DM, CAD s/p CABG, left carotid stenosis admitted for sepsis with CAP.  Clinical Impression  Patient presents with decreased mobility due to deficits listed in PT problem list and will benefit from skilled PT in the acute setting to allow return home with follow up PT at [facility].    Follow Up Recommendations No PT follow up ([facility] set up at [facility] for therapy)    Equipment Recommendations  None recommended by PT    Recommendations for Other Services       Precautions / Restrictions Precautions Precautions: Fall Restrictions Weight Bearing Restrictions: No      Mobility  Bed Mobility               General bed mobility comments: up in recliner  Transfers Overall transfer level: Needs assistance Equipment used: Rolling walker (2 wheeled) Transfers: Sit to/from Stand Sit to Stand: Supervision            Ambulation/Gait Ambulation/Gait assistance: Supervision Ambulation Distance (Feet): 160 Feet Assistive device: Rolling walker (2 wheeled) Gait Pattern/deviations: Step-through pattern;Decreased stride length     General Gait Details: safe use of walker, though increased time needed to maneuver around obstacles  Stairs Stairs: Yes Stairs assistance: Min guard Stair Management: No rails;Alternating pattern;Forwards;Step to pattern;One rail Right;One rail Left Number of Stairs: 5 General stair comments: assist for safety/balance/to hold up gown& robe to avoid tripping  Wheelchair Mobility    Modified Rankin (Stroke Patients Only)       Balance Overall balance assessment: Needs assistance   Sitting balance-Leahy Scale: Good     Standing  balance support: No upper extremity supported Standing balance-Leahy Scale: Good                 High Level Balance Comments: stands 1 minute no UE support, able to look over each shoulder without difficulty, eyes closed 10 seconds S for safety and takes steps forward and back no AD/             Pertinent Vitals/Pain Pain Assessment: 0-10 Pain Score: 8  Pain Location: headache Pain Descriptors / Indicators: Headache Pain Intervention(s): Monitored during session;RN gave pain meds during session    Home Living Family/patient expects to be discharged to:: Private residence Living Arrangements: Non-relatives/Friends Available Help at Discharge: Other (Comment) (roomate) Type of Home: House Home Access: Stairs to enter Entrance Stairs-Rails: Psychiatric nurse of Steps: 3 Home Layout: One level Home Equipment: Shower seat;Grab bars - tub/shower;Hand held shower head;Cane - single point;Walker - 2 wheels;Cane - quad      Prior Function Level of Independence: Independent with assistive device(s)         Comments: used cane mostly at home previous to admission     Hand Dominance        Extremity/Trunk Assessment   Upper Extremity Assessment: Overall WFL for tasks assessed           Lower Extremity Assessment: Overall WFL for tasks assessed         Communication   Communication: Expressive difficulties (dysarthria)  Cognition Arousal/Alertness: Awake/alert Behavior During Therapy: WFL for tasks assessed/performed Overall Cognitive Status: Within Functional Limits for tasks assessed  General Comments      Exercises        Assessment/Plan    PT Assessment Patient needs continued PT services  PT Diagnosis Abnormality of gait;Generalized weakness   PT Problem List Decreased strength;Cardiopulmonary status limiting activity;Decreased activity tolerance;Impaired tone  PT Treatment Interventions DME  instruction;Gait training;Patient/family education;Stair training;Functional mobility training;Therapeutic activities;Therapeutic exercise;Balance training   PT Goals (Current goals can be found in the Care Plan section) Acute Rehab PT Goals Patient Stated Goal: To return home and back to [facility] PT Goal Formulation: With patient Time For Goal Achievement: 02/23/16 Potential to Achieve Goals: Good    Frequency Min 3X/week   Barriers to discharge        Co-evaluation               End of Session Equipment Utilized During Treatment: Gait belt Activity Tolerance: Patient tolerated treatment well Patient left: in chair;with call bell/phone within reach           Time: 1020-1050 PT Time Calculation (min) (ACUTE ONLY): 30 min   Charges:   PT Evaluation $PT Eval Moderate Complexity: 1 Procedure PT Treatments $Gait Training: 8-22 mins   PT G CodesReginia Naas 2016-02-21, 4:04 PM  Magda Kiel, [place] Feb 21, 2016

## 2016-02-18 ENCOUNTER — Other Ambulatory Visit: Payer: Self-pay | Admitting: Cardiology

## 2016-02-18 DIAGNOSIS — I5042 Chronic combined systolic (congestive) and diastolic (congestive) heart failure: Secondary | ICD-10-CM

## 2016-02-18 LAB — CULTURE, BLOOD (ROUTINE X 2)
CULTURE: NO GROWTH
CULTURE: NO GROWTH

## 2016-02-18 LAB — GLUCOSE, CAPILLARY
GLUCOSE-CAPILLARY: 154 mg/dL — AB (ref 65–99)
GLUCOSE-CAPILLARY: 210 mg/dL — AB (ref 65–99)
GLUCOSE-CAPILLARY: 193 mg/dL — AB (ref 65–99)
Glucose-Capillary: 159 mg/dL — ABNORMAL HIGH (ref 65–99)
Glucose-Capillary: 198 mg/dL — ABNORMAL HIGH (ref 65–99)

## 2016-02-18 LAB — CBC
HEMATOCRIT: 33.9 % — AB (ref 39.0–52.0)
Hemoglobin: 10.1 g/dL — ABNORMAL LOW (ref 13.0–17.0)
MCH: 23.4 pg — ABNORMAL LOW (ref 26.0–34.0)
MCHC: 29.8 g/dL — AB (ref 30.0–36.0)
MCV: 78.5 fL (ref 78.0–100.0)
PLATELETS: 217 10*3/uL (ref 150–400)
RBC: 4.32 MIL/uL (ref 4.22–5.81)
RDW: 14.9 % (ref 11.5–15.5)
WBC: 7.9 10*3/uL (ref 4.0–10.5)

## 2016-02-18 LAB — BASIC METABOLIC PANEL
ANION GAP: 5 (ref 5–15)
BUN: 14 mg/dL (ref 6–20)
CALCIUM: 8.7 mg/dL — AB (ref 8.9–10.3)
CO2: 31 mmol/L (ref 22–32)
CREATININE: 0.89 mg/dL (ref 0.61–1.24)
Chloride: 101 mmol/L (ref 101–111)
GLUCOSE: 164 mg/dL — AB (ref 65–99)
Potassium: 3.7 mmol/L (ref 3.5–5.1)
Sodium: 137 mmol/L (ref 135–145)

## 2016-02-18 MED ORDER — METOPROLOL TARTRATE 12.5 MG HALF TABLET
12.5000 mg | ORAL_TABLET | Freq: Two times a day (BID) | ORAL | Status: DC
  Administered 2016-02-18 – 2016-02-19 (×2): 12.5 mg via ORAL
  Filled 2016-02-18 (×2): qty 1

## 2016-02-18 MED ORDER — LEVOFLOXACIN 500 MG PO TABS: 500.0000 mg | ORAL_TABLET | Freq: Every day | ORAL | Status: DC

## 2016-02-18 NOTE — Discharge Instructions (Signed)

## 2016-02-18 NOTE — Discharge Summary (Signed)
Physician Discharge Summary  [name] [id number] DOB: 1956-06-28 DOA: 02/13/2016  PCP: Thane Edu, MD  Admit date: 02/13/2016 Discharge date: 02/19/2016  Recommendations for Outpatient Follow-up:  1. Pt will need to follow up with PCP in 2-3 weeks post discharge 2. Please obtain BMP to evaluate electrolytes and kidney function 3. Please also check CBC to evaluate Hg and Hct levels 4. Pt needs to complete 4 more days of Levaquin upon discharge  5. Cardiologist recommended consideration to stress test in future if pt experiences more chest pain   Discharge Diagnoses:  Principal Problem:   Sepsis (HCC) Active Problems:   Anxiety state   Ventral hernia   IDA (iron deficiency anemia)   PAF (paroxysmal atrial fibrillation) (HCC)   CAP (community acquired pneumonia)   Nausea and vomiting   Diabetes mellitus type 2 in obese (HCC)   Elevated troponin  Discharge Condition: Stable  Diet recommendation: Heart healthy diet discussed in details   Brief Narrative:  60 y.o. male with medical history significant of mental retardation, COPD oxygen dependent, PAF, combined CHF last EF 55-60% with grade 2 diastolic dysfunction, HTN, DM, CAD s/p CABG, left carotid stenosis; who presented with complaints of being sick with nausea and vomiting.   ED Course: Upon admission to the emergency department patient was evaluated and seen to have a temperature of 102.79F, pulse 72-75, respirations up to 26, blood pressures as high as 176/100, O2 saturations 97% on 2 L nasal cannula oxygen. Initial lab work revealed a WBC of 17.9, hemoglobin 11.8, platelets 340, troponin 0.13, and a lactic acid of 6.31. Urinalysis was negative for signs of infection. Chest x-ray showed signs of possible developing bronchopulmonary pneumonia. Patient was started on vancomycin and Zosyn and TRH asked to admit for further evaluation.   Assessment and plan: Sepsis secondary to community-acquired  pneumonia/chronic respiratory failure oxygen dependent  - pt clinically improving on Vancomycin and Maxipime, will continue same regimen, today is day #5 - no fevers overnight, pt maintaining oxygen saturations at target range for now - transition to oral ABX Levaquin upon discharge  - continue to monitor   Nausea, vomiting, and diarrhea - possibly related the above - now resolved - advanced diet and pt tolerating well   Elevated Troponin - initial troponin 0.13. EKG showing no ischemic changes. - likely related to demand ischemia in the setting of sepsis - troponins trending down  - cardiology consulted, recommended outpatient follow up  Paroxysmal atrial fibrillation: CHADS2 score = 2 - Continue Pradaxa andmetoprolol  Congestive heart failure, chronic  - Last EF noted to be 55-60% with grade 2 diastolic dysfunction - weight 84 kg this AM - cardiology has cleared for discharge   Iron deficiency anemia - no signs of bleeding  Diabetes mellitus type II - Last A1c was noted to be 5.8 in 08/2015. - reasonable inpatient control   Ventral hernia/Abdominal hernia - Chronic  Multiples skin wounds - partial thickness tissue loss on right UE and LE, Partial and Full thickness wounds on LLE. - Right UE with two partial thickness injuries, the largest of which measures 0.2cm round x 0.1cm deep.  - Two similar areas on the RLE: two partial thickness areas in a 3cm x 3cm area, the largest of which measures 0.2cm round x 0.1cm. - LLE with 5 partial thickness injuries on the pretibial region in a 5cm x 3cm, the largest measures 0.8 x 1.2 x 0.2cm.  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  - Continue fludrocortisone  Anxiety/depression - Continue to Paxil and klonipin prn - stable   Essential  hypertension - Continue metoprolol   Hyperlipidemia - Continue Atorvastatin  DVT prophylaxis: Pradaxa Code Status: Full Family Communication: No family present at bedside, pt updated and his questions were answered  Disposition Plan: in AM  Consultants: cardiology over the phone initially and repaged to discuss ECHO results, awaiting call back  ABX: vancomycin and maxipime 7/9 --> transitioned to oral Levaquin to complete therapy  Procedures: none   Procedures/Studies: Dg Chest Portable 1 View  02/13/2016  CLINICAL DATA:  60 year old male with generalized weakness since 8 a.m. this morning. Atrial fibrillation. Nausea and vomiting. Right-sided chest pain. EXAM: PORTABLE CHEST 1 VIEW COMPARISON:  Chest x-ray 09/16/2015. FINDINGS: Lung volumes are low. Diffuse peribronchial cuffing. Patchy ill-defined airspace opacities in the mid to lower left hemithorax. Right lung appears clear. No pleural effusions. No evidence of pulmonary edema. Mild cardiomegaly. The patient is rotated to the left on today's exam, resulting in distortion of the mediastinal contours and reduced diagnostic sensitivity and specificity for mediastinal pathology. Atherosclerosis in the thoracic aorta. Status post median sternotomy for CABG. IMPRESSION: 1. Peribronchial cuffing, concerning for an acute bronchitis. There also appears to be some ill-defined airspace disease throughout the mid to lower left lung which could indicate developing bronchopneumonia. Followup PA and lateral chest X-ray is recommended in 3-4 weeks following trial of antibiotic therapy to ensure resolution and exclude underlying malignancy. 2. Aortic atherosclerosis. 3. Mild cardiomegaly. Electronically Signed   By: Vinnie Langton M.D.   On: 02/13/2016 22:14    Discharge Exam: Filed Vitals:   02/18/16 0924 02/18/16 1144  BP:  156/90  Pulse: 56 49  Temp:  98 F (36.7 C)  Resp:  18   Filed Vitals:   02/18/16 0525 02/18/16 0743 02/18/16 0924  02/18/16 1144  BP: 145/64 158/41  156/90  Pulse: 53 50 56 49  Temp: 97.9 F (36.6 C) 98.2 F (36.8 C)  98 F (36.7 C)  TempSrc: Oral Oral  Oral  Resp: 18 17  18   Height:      Weight: 83.825 kg (184 lb 12.8 oz)     SpO2: 100% 98%  100%    General: Pt is alert, follows commands appropriately, not in acute distress Cardiovascular: Regular rate and rhythm, S1/S2 +, no rubs, no gallops Respiratory: Clear to auscultation bilaterally, no wheezing, no crackles, no rhonchi Abdominal: Soft, non tender, non distended, bowel sounds +, no guarding Extremities: no cyanosis, pulses palpable bilaterally DP and PT Neuro: Grossly nonfocal  Discharge Instructions     Medication List    TAKE these medications        acetaminophen 325 MG tablet  Commonly known as:  TYLENOL  Take 2 tablets (650 mg total) by mouth every 6 (six) hours as needed for mild pain.     aspirin 81 MG EC tablet  Take 1 tablet (81 mg total) by mouth daily.     atorvastatin 80 MG tablet  Commonly known as:  LIPITOR  Take 1 tablet (80 mg total) by mouth daily at 6 PM.     clonazePAM 0.5 MG tablet  Commonly known as:  KLONOPIN  Take 1 tablet (0.5 mg total) by mouth at bedtime.     dabigatran 150 MG Caps capsule  Commonly known as:  PRADAXA  Take 150 mg by mouth 2 (two) times daily.     ferrous gluconate 324 MG tablet  Commonly known as:  FERGON  Take 1 tablet (324 mg total) by mouth daily with breakfast.     fludrocortisone 0.1 MG tablet  Commonly known as:  FLORINEF  Take 0.1 mg by mouth daily.     furosemide 40 MG tablet  Commonly known as:  LASIX  Take 40 mg by mouth daily.     gabapentin 600 MG tablet  Commonly known as:  NEURONTIN  Take 600 mg by mouth 2 (two) times daily.     HYDROcodone-acetaminophen 5-325 MG tablet  Commonly known as:  NORCO/VICODIN  Take 1 tablet by mouth every 6 (six) hours as needed for moderate pain.     hydrocortisone 2.5 % rectal cream  Commonly known as:  ANUSOL-HC   Place 1 application rectally 3 (three) times daily as needed for hemorrhoids or itching.     Insulin Glargine 100 UNIT/ML Solostar Pen  Commonly known as:  LANTUS  Inject 10 Units into the skin every evening.     levofloxacin 500 MG tablet  Commonly known as:  LEVAQUIN  Take 1 tablet (500 mg total) by mouth daily.     metFORMIN 1000 MG tablet  Commonly known as:  GLUCOPHAGE  Take 1,000 mg by mouth 2 (two) times daily with a meal.     metoprolol tartrate 25 MG tablet  Commonly known as:  LOPRESSOR  Take 25 mg by mouth 2 (two) times daily.     omeprazole 20 MG capsule  Commonly known as:  PRILOSEC  Take 20 mg by mouth daily.     PARoxetine 40 MG tablet  Commonly known as:  PAXIL  Take 40 mg by mouth daily.     polyvinyl alcohol 1.4 % ophthalmic solution  Commonly known as:  LIQUIFILM TEARS  Place 1 drop into both eyes 2 (two) times daily as needed for dry eyes. Natural Tears     Vitamin D3 1000 units Caps  Take 1,000 Units by mouth daily.           The results of significant diagnostics from this hospitalization (including imaging, microbiology, ancillary and laboratory) are listed below for reference.     Microbiology: Recent Results (from the past 240 hour(s))  Blood culture (routine x 2)     Status: None (Preliminary result)   Collection Time: 02/13/16  9:55 PM  Result Value Ref Range Status   Specimen Description BLOOD LEFT WRIST  Final   Special Requests BOTTLES DRAWN AEROBIC AND ANAEROBIC 5CC   Final   Culture NO GROWTH 4 DAYS  Final   Report Status PENDING  Incomplete  Blood culture (routine x 2)     Status: None (Preliminary result)   Collection Time: 02/13/16  9:55 PM  Result Value Ref Range Status   Specimen Description BLOOD RIGHT ANTECUBITAL  Final   Special Requests BOTTLES DRAWN AEROBIC AND ANAEROBIC 5CC   Final   Culture NO GROWTH 4 DAYS  Final   Report Status PENDING  Incomplete  Urine culture     Status: None   Collection Time: 02/13/16  10:27 PM  Result Value Ref Range Status   Specimen Description URINE, CLEAN CATCH  Final   Special Requests NONE  Final   Culture NO GROWTH  Final   Report Status 02/15/2016 FINAL  Final  MRSA PCR Screening     Status: None   Collection Time: 02/14/16  9:14 AM  Result Value Ref Range Status   MRSA by PCR NEGATIVE NEGATIVE Final    Comment:        The GeneXpert MRSA Assay (FDA approved for NASAL specimens only), is one component of a comprehensive MRSA colonization surveillance program. It is not intended to diagnose MRSA infection nor to guide or monitor treatment for MRSA infections.      Labs: Basic Metabolic Panel:  Recent Labs Lab 02/13/16 2155 02/14/16 0420 02/15/16 0406 02/17/16 0459 02/18/16 0534  NA 138 137 139 139 137  K 3.9 3.9 3.5 3.7 3.7  CL 98* 100* 106 102 101  CO2 26 27 28 28 31   GLUCOSE 92 141* 129* 140* 164*  BUN 15 14 15 13 14   CREATININE 0.93 0.85 0.89 0.83 0.89  CALCIUM 9.0 8.0* 7.8* 8.5* 8.7*   Liver Function Tests:  Recent Labs Lab 02/13/16 2155  AST 32  ALT 14*  ALKPHOS 58  BILITOT 0.3  PROT 7.0  ALBUMIN 3.1*    Recent Labs Lab 02/13/16 2155  LIPASE 22   CBC:  Recent Labs Lab 02/13/16 2155 02/14/16 0420 02/15/16 0406 02/17/16 0459 02/18/16 0534  WBC 17.9* 13.8* 8.4 8.6 7.9  NEUTROABS 15.8*  --   --   --   --   HGB 11.8* 10.9* 9.9* 10.1* 10.1*  HCT 38.7* 36.3* 33.7* 33.6* 33.9*  MCV 78.8 79.6 79.5 78.9 78.5  PLT 340 269 220 221 217   Cardiac Enzymes:  Recent Labs Lab 02/14/16 0721 02/14/16 1221 02/14/16 1656 02/14/16 2148 02/15/16 0406  TROPONINI 0.62* 0.49* 0.29* 0.19* 0.17*   BNP: BNP (last 3 results)  Recent Labs  05/19/15 0855  BNP 50.5   CBG:  Recent Labs Lab 02/17/16 1946 02/17/16 2350 02/18/16 0416 02/18/16 0727 02/18/16 1128  GLUCAP 208* 140* 159* 154* 210*   SIGNED: Time coordinating discharge: 30 minutes  MAGICK-Arial Galligan, MD  [facility] 02/18/2016, 12:49 PM Pager  [phone]  If 7PM-7AM, please contact night-coverage [email or web address] Password [email or web address]

## 2016-02-18 NOTE — Care Management Important Message (Signed)
Important Message  Patient Details  Name: [name] MRN: [id number] Date of Birth: [birth year]   Medicare Important Message Given:  Yes    Nathen May 02/18/2016, 1:36 PM

## 2016-02-18 NOTE — Consult Note (Signed)
Cardiology Consult    Patient ID: [name] MRN: [id number], DOB/AGE: [birth year]   Admit date: 02/13/2016 Date of Consult: 02/18/2016  Primary Physician: Joseph Maroon, MD Primary Cardiologist: Dr. Cathie [name] Requesting Provider: Dr. Doyle [name] Reason for Consultation: Elevated Troponin  Patient Profile    60 yo male with PMH of CAD, combined systolic and diastolic CHF, ischemic CM, PAF (on pradaxa), HTN, diabetes, HL, prior stroke, carotid stenosis, COPD, intellectual disability, diabetic ulcer s/p R midfoot amputation 2014 who was admitted for CAP.  Past Medical History   Past Medical History  Diagnosis Date  . Hypertension   . Intellectual disability     Sister helps to take care of him and takes him to appts  . Tobacco user     Smokes 1ppd for multiple years.  Quit after hosp 09/2010.  Marland Kitchen Anemia     History of Iron Def Anemia  . Anxiety   . Hyperlipidemia   . PAF (paroxysmal atrial fibrillation) ([place]) 06/2009    CHADS score 2 (HTN, DM), was not on coumadin, but now on Pradaxa for Afib  . Lung nodule   . Chronic low back pain   . CVA (cerebral infarction) 09/2010    Bilateral with Left > Right  . Abdominal hernia     Chronic, not a good surgical candidate  . Carotid artery occlusion   . Obesity   . COPD (chronic obstructive pulmonary disease) ([place])   . Abscess, abdomen ([place]) 12/31/2010    Referred to Wound Care in 01/2011 because of multiple abd abscess with VERY large ventral hernia (please look at image of CT abd/pelvis 09/2010).  Because of hernia I was hesitant to I&D.      Marland Kitchen GERD (gastroesophageal reflux disease)   . Pneumonia     hx of  . Chronic diastolic CHF (congestive heart failure) (HCC)     takes Lasix  . Itching     all over body; pt scratches and has sores on bilateral arms and abdomen  . Hx of echocardiogram     Echo 5/16:  Mild LVH, EF 55%, indeterm. diast function, WMA could not be ruled out, MAC, trivial MR, mild LAE, normal RVF //  b. Echo  4/17: EF 55-60%, no RWMA, Gr 2 DD, Ao sclerosis, MAC, mild MS, mild LAE, PASP 55 mHg  . Myocardial infarction [facility]) 2016 ?    Heart attack  (  Per  pt. )  . History of nuclear stress test     Myoview 9/16:  Inferior, apical and inf-lateral ischemia; not gated; High Risk  . Shortness of breath dyspnea     with exertion  . Diabetes mellitus   . Oxygen dependent     wears 2 liters at bedtime and when needed  . Wears glasses   . Stroke [facility])     Past Surgical History  Procedure Laterality Date  . Carotid arteriogram  10/2010    30% right ICA stenosis, 40% left ICA stenosis   . Transesophageal echocardiogram  09/2010    No ASD or PFO. EF 60-65%.  Normal systolic function. No evidence of thrombus.   . Transthoracic echocardiogram  09/2010     The cavity size was normal. Systolic function was vigorous.  EF 65-70%.  Normal wall funciton.   . Debridment of decubitus ulcer Right 02/13/2013  . I&d extremity Right 02/13/2013    Procedure: IRRIGATION AND DEBRIDEMENT FOOT ULCER;  Surgeon: Joseph Bridge, MD;  Location: [place];  Service: Orthopedics;  Laterality: Right;  PULSE LAVAGE  . Cataract extraction, bilateral    . Arm surgery Left     as a child  . Amputation Right 03/29/2013    Procedure: AMPUTATION RAY;  Surgeon: Joseph Minion, MD;  Location: [place];  Service: Orthopedics;  Laterality: Right;  Right Foot 3rd and Possible 4th Ray Amputation  . Multiple tooth extractions    . Amputation Right 04/23/2013    Procedure: AMPUTATION RIGHT MID-FOOT;  Surgeon: Joseph Minion, MD;  Location: [place];  Service: Orthopedics;  Laterality: Right;  . Esophagogastroduodenoscopy (egd) with propofol N/A 06/10/2014    Procedure: ESOPHAGOGASTRODUODENOSCOPY (EGD) WITH PROPOFOL;  Surgeon: Joseph Bears, MD;  Location: [facility];  Service: Gastroenterology;  Laterality: N/A;  . Colonoscopy with propofol N/A 06/10/2014    Procedure: COLONOSCOPY WITH PROPOFOL;  Surgeon: Joseph Bears, MD;  Location: [facility];  Service:  Gastroenterology;  Laterality: N/A;  . Givens capsule study N/A 07/09/2014    Procedure: GIVENS CAPSULE STUDY;  Surgeon: Joseph Bears, MD;  Location: [facility];  Service: Gastroenterology;  Laterality: N/A;  . Cardiac catheterization N/A 04/30/2015    Procedure: Left Heart Cath and Coronary Angiography;  Surgeon: Joseph Crome, MD;  Location: [place] [facility];  Service: Cardiovascular;  Laterality: N/A;  . Coronary artery bypass graft N/A 08/13/2015    Procedure: CORONARY ARTERY BYPASS GRAFTING (CABG), ON PUMP, TIMES THREE, USING LEFT INTERNAL MAMMARY ARTERY, RIGHT GREATER SAPHENOUS VEIN HARVESTED ENDOSCOPICALLY;  Surgeon: Joseph Pollack, MD;  Location: [place] [facility];  Service: Open Heart Surgery;  Laterality: N/A;  . Tee without cardioversion N/A 08/13/2015    Procedure: TRANSESOPHAGEAL ECHOCARDIOGRAM (TEE);  Surgeon: Joseph Pollack, MD;  Location: [place];  Service: Open Heart Surgery;  Laterality: N/A;  . Endarterectomy Left 08/13/2015    Procedure: ENDARTERECTOMY CAROTID;  Surgeon: Joseph Mould, MD;  Location: [facility];  Service: Vascular;  Laterality: Left;  . Carotid endarterectomy Left 08-13-15    CEA     Allergies  Allergies  Allergen Reactions  . Penicillins Hives, Nausea And Vomiting, Swelling and Other (See Comments)    Has patient had a PCN reaction causing immediate rash, facial/tongue/throat swelling, SOB or lightheadedness with hypotension: YES Has patient had a PCN reaction causing severe rash involving mucus membranes or skin necrosis: No Has patient had a PCN reaction that required hospitalization No Has patient had a PCN reaction occurring within the last 10 years: No If all of the above answers are "NO", then may proceed with Cephalosporin use.     History of Present Illness    [name] is a 60 yo male patient of Dr. Acie [name] with PMH of CAD, combined systolic and diastolic CHF, ischemic CM, PAF (on pradaxa), HTN, diabetes, HL, prior stroke, RTA type 4, carotid stenosis,  COPD, intellectual disability, diabetic ulcer s/p R midfoot amputation 2014. Was admitted as an  NSTEMI in the setting of sepsis combined with acute hypoxic respiratory failure secondary to acute on chronic diastolic congestive heart failure, COPD and A. fib RVR. At that time, given his comorbidities he was treated conservatively. In 8/16 he had progressive carotid artery stenosis of the left side, along with a nuclear stress test that was considered high risk. Left heart cath done on 9/16 demonstrated three-vessel disease, and CABG plus CEA was recommended at that time. He did ultimately undergo a CABG with L-LAD, S-RI, S-OM2 + L CEA on 08/13/15. He did have episodes of A. fib postoperatively and was  placed on amiodarone for a period of time, but was stopped secondary to bradycardia. His last echo in 4/17 demonstrated an improved LVEF with EF of 50-55%, moderate diastolic dysfunction and moderate pulmonary hypertension. He was last seen in the office on 01/27/2016 where he reported doing well. Did report sleeping with oxygen, with occasional episodes of shortness of breath at night. Denied any edema or syncope but did have occasional chest pain. That time did not seem to be related to exertion. All of his medications were continued the same.   He reports being in his normal state of health, according that he goes to the [facility] often in uses the exercise bike without experiencing episodes of chest pain or dyspnea on a regular basis. Was feeling well up until the weekend whenever he started to experience nausea and vomiting, and he presented to the [facility] ED. He was found to have a temperature of 102.5, pulse in the 70s, respirations up to 26, and blood pressures with systolic readings in the [id number] and diastolic in the [id number]. His initial lab work showed an elevated WBC of 17.9, hemoglobin 11.8, troponin 0.13 and lactic acid of 6.31. Chest x-ray showed concern for community-acquired pneumonia, and he was started  on vancomycin and Zosyn. EKG showed last rhythm with known RBBB and nonspecific T-wave changes. He was ultimately admitted to internal medicine for further workup. During his admission a repeat 2-D echocardiogram was ordered showing a decrease in EF from 55-60% to 45-50% with diffuse hypokinesis. Denies any dizziness, lightheadedness, palpitations.  Inpatient Medications    . atorvastatin  80 mg Oral q1800  . cholecalciferol  1,000 Units Oral Daily  . clonazePAM  0.5 mg Oral QHS  . dabigatran  150 mg Oral BID  . ferrous gluconate  324 mg Oral Q breakfast  . fludrocortisone  0.1 mg Oral Q breakfast  . furosemide  40 mg Oral Daily  . gabapentin  300 mg Oral BID  . insulin aspart  0-9 Units Subcutaneous Q4H  . levofloxacin  500 mg Oral Daily  . metoprolol tartrate  12.5 mg Oral BID  . PARoxetine  40 mg Oral Daily  . polyvinyl alcohol  1 drop Both Eyes BID  . sodium chloride flush  3 mL Intravenous Q12H    Family History    Family History  Problem Relation Age of Onset  . Heart disease Mother   . Hypertension Sister   . Heart disease Brother   . Heart disease Father   . Peripheral vascular disease Father   . Heart disease Maternal Grandmother   . Heart attack Maternal Grandmother   . Breast cancer Maternal Grandmother   . Colon cancer Neg Hx   . Stroke Neg Hx   . Stomach cancer Maternal Uncle     Social History    Social History   Social History  . Marital Status: Single    Spouse Name: N/A  . Number of Children: 0  . Years of Education: N/A   Occupational History  .     Social History Main Topics  . Smoking status: Former Smoker -- 1.00 packs/day for 42 years    Types: Cigarettes, Pipe    Quit date: 11/10/2010  . Smokeless tobacco: Former Systems developer    Quit date: 10/02/2010  . Alcohol Use: No     Comment: " last drink was 2003" 08/11/15  . Drug Use: No  . Sexual Activity: Not on file   Other Topics Concern  . Not  on file   Social History Narrative   Released from  prison in 2003 after serving 2 years for indecency with a minor.    He smokes cigarettes greater than 30 years, also smoked a pipe.  Quit tobacco 09/2010.   Denies EtOH and drugs.      Review of Systems    General:  No chills, fever, night sweats or weight changes.  Cardiovascular:  See HPI Dermatological: No rash, lesions/masses Respiratory: See HPI Urologic: No hematuria, dysuria Abdominal:   No nausea, vomiting, diarrhea, bright red blood per rectum, melena, or hematemesis Neurologic:  No visual changes, wkns, changes in mental status. All other systems reviewed and are otherwise negative except as noted above.  Physical Exam    Blood pressure 156/90, pulse 49, temperature 98 F (36.7 C), temperature source Oral, resp. rate 18, height 5' (1.524 m), weight 184 lb 12.8 oz (83.825 kg), SpO2 100 %.  General: Pleasant obese male, NAD Psych: Normal affect. Neuro: Alert and oriented X 3. Moves all extremities spontaneously. HEENT: Normal  Neck: Supple without bruits or JVD, Left CEA scar. Lungs:  Resp regular and unlabored, Slightly diminished in lower lobes. Heart: RRR no s3, s4, 3/6 harsh systolic murmur. Abdomen: Soft, obese, large stable hernia, non-tender, BS + x 4.  Extremities: No clubbing, cyanosis, 1+ Bilateral LE edema R> L.   Labs    Troponin (Point of Care Test) No results for input(s): TROPIPOC in the last 72 hours. No results for input(s): CKTOTAL, CKMB, TROPONINI in the last 72 hours. Lab Results  Component Value Date   WBC 7.9 02/18/2016   HGB 10.1* 02/18/2016   HCT 33.9* 02/18/2016   MCV 78.5 02/18/2016   PLT 217 02/18/2016    Recent Labs Lab 02/13/16 2155  02/18/16 0534  NA 138  < > 137  K 3.9  < > 3.7  CL 98*  < > 101  CO2 26  < > 31  BUN 15  < > 14  CREATININE 0.93  < > 0.89  CALCIUM 9.0  < > 8.7*  PROT 7.0  --   --   BILITOT 0.3  --   --   ALKPHOS 58  --   --   ALT 14*  --   --   AST 32  --   --   GLUCOSE 92  < > 164*  < > = values in this  interval not displayed. Lab Results  Component Value Date   CHOL 99* 07/14/2015   HDL 21* 07/14/2015   LDLCALC 47 07/14/2015   TRIG 156* 07/14/2015     Radiology Studies    Dg Chest Portable 1 View  02/13/2016  CLINICAL DATA:  60 year old male with generalized weakness since 8 a.m. this morning. Atrial fibrillation. Nausea and vomiting. Right-sided chest pain. EXAM: PORTABLE CHEST 1 VIEW COMPARISON:  Chest x-ray 09/16/2015. FINDINGS: Lung volumes are low. Diffuse peribronchial cuffing. Patchy ill-defined airspace opacities in the mid to lower left hemithorax. Right lung appears clear. No pleural effusions. No evidence of pulmonary edema. Mild cardiomegaly. The patient is rotated to the left on today's exam, resulting in distortion of the mediastinal contours and reduced diagnostic sensitivity and specificity for mediastinal pathology. Atherosclerosis in the thoracic aorta. Status post median sternotomy for CABG. IMPRESSION: 1. Peribronchial cuffing, concerning for an acute bronchitis. There also appears to be some ill-defined airspace disease throughout the mid to lower left lung which could indicate developing bronchopneumonia. Followup PA and lateral chest X-ray is recommended  in 3-4 weeks following trial of antibiotic therapy to ensure resolution and exclude underlying malignancy. 2. Aortic atherosclerosis. 3. Mild cardiomegaly. Electronically Signed   By: Vinnie Langton M.D.   On: 02/13/2016 22:14    ECG & Cardiac Imaging    EKG: SR RBBB, nonspecific T wave changes noted on previous tracings  Echo: 02/15/2016  Study Conclusions  - Left ventricle: The cavity size was normal. Wall thickness was  increased in a pattern of mild LVH. Systolic function was mildly  reduced. The estimated ejection fraction was in the range of 45%  to 50%. Diffuse hypokinesis. - Aortic valve: Trileaflet; mildly thickened, mildly calcified  leaflets. - Aorta: The aorta was mildly calcified. - Left  atrium: The atrium was mildly dilated. Anterior-posterior  dimension: 43 mm. - Pericardium, extracardiac: A trivial pericardial effusion was  identified posterior to the heart and along the left ventricular  free wall. Features were not consistent with tamponade  physiology.  Impressions:  - When compared to prior echocardiogram in 11/2015, EF is mildly  reduced and heart rate is increased.  Assessment & Plan    Mr. Broberg is a 60 yo male patient of Dr. Acie [name] with PMH of CAD, combined systolic and diastolic CHF, ischemic CM, PAF (on pradaxa), HTN, diabetes, HL, prior stroke, RTA type 4, carotid stenosis, COPD, intellectual disability, diabetic ulcer s/p R midfoot amputation 2014.  1. Elevated Troponin: He initially presented to the ED with acute sepsis in the setting of CAP, with labs showing elevated lactic at 6.31, trop 0.17 and WBC 17.9. He does reports centralized chest pain over the weekend prior to admission, which he has not experienced during this admission. [facility] and using the exercise bike regularly.He does not experience angina or DOE with this activity.  -- Trop 0.40>>0.62>>0.29>>0.17 during this admission, along with lactic acid 6.31>>3.9>>2.1. Elevation in trop likely related to demand ischemia in the setting of sepsis 2/2 CAP. He does having had any chest pain this admission, and had ambulated in the hallway without any difficulty.   2. Combined systolic and diastolic CHF: Does not appear to be fluid overloaded this admission, and x-ray was not concerning for edema. Last 2D echo in 11/2015 showed an improved EF of 55-60% since undergoing CABG. Repeat Echo this admission shows a decreased EF of 40-45% with diffuse hypokinesis. Given he has no reports of angina with rest or activity during this admission, or with activity prior to admission, seems unlikely this mild decrease is related to ischemia.  -- Would arrange for outpatient myoview to  further assess.   3. CAP: Per primary team  4. PAF: Currently in SR/SB since this admission, continue pradaxa --This patients CHA2DS2-VASc Score and unadjusted Ischemic Stroke Rate (% per year) is equal to 7.2 % stroke rate/year from a score of 5 Above score calculated as 1 point each if present [CHF, HTN, DM, Vascular=MI/PAD/Aortic Plaque, Age if 65-74, or Male] Above score calculated as 2 points each if present [Age > 75, or Stroke/TIA/TE]  5. HTN: Continue metoprolol  6. HLD: on Statin  7. CVA  8. COPD   Signed, Reino Bellis, NP-C Pager [phone] 02/18/2016, 3:16 PM

## 2016-02-19 DIAGNOSIS — J189 Pneumonia, unspecified organism: Secondary | ICD-10-CM

## 2016-02-19 DIAGNOSIS — A419 Sepsis, unspecified organism: Principal | ICD-10-CM

## 2016-02-19 LAB — BASIC METABOLIC PANEL
Anion gap: 10 (ref 5–15)
BUN: 14 mg/dL (ref 6–20)
CALCIUM: 8.8 mg/dL — AB (ref 8.9–10.3)
CHLORIDE: 97 mmol/L — AB (ref 101–111)
CO2: 31 mmol/L (ref 22–32)
CREATININE: 0.9 mg/dL (ref 0.61–1.24)
GFR calc Af Amer: >60 mL/min (ref >60)
GFR calc non Af Amer: >60 mL/min (ref >60)
Glucose, Bld: 177 mg/dL — ABNORMAL HIGH (ref 65–99)
Potassium: 4 mmol/L (ref 3.5–5.1)
Sodium: 138 mmol/L (ref 135–145)

## 2016-02-19 LAB — GLUCOSE, CAPILLARY
GLUCOSE-CAPILLARY: 196 mg/dL — AB (ref 65–99)
GLUCOSE-CAPILLARY: 202 mg/dL — AB (ref 65–99)
Glucose-Capillary: 178 mg/dL — ABNORMAL HIGH (ref 65–99)

## 2016-02-19 LAB — CBC
HEMATOCRIT: 32.5 % — AB (ref 39.0–52.0)
HEMOGLOBIN: 9.9 g/dL — AB (ref 13.0–17.0)
MCH: 23.7 pg — AB (ref 26.0–34.0)
MCHC: 30.5 g/dL (ref 30.0–36.0)
MCV: 77.8 fL — ABNORMAL LOW (ref 78.0–100.0)
Platelets: 217 10*3/uL (ref 150–400)
RBC: 4.18 MIL/uL — ABNORMAL LOW (ref 4.22–5.81)
RDW: 15.3 % (ref 11.5–15.5)
WBC: 7.2 10*3/uL (ref 4.0–10.5)

## 2016-02-19 MED ORDER — METOPROLOL TARTRATE 25 MG PO TABS: 12.5000 mg | ORAL_TABLET | Freq: Two times a day (BID) | ORAL | Status: DC

## 2016-02-19 NOTE — Discharge Summary (Signed)
Physician Discharge Summary  [name] [id number] DOB: 06-19-1956 DOA: 02/13/2016  PCP: Joseph Maroon, MD  Admit date: 02/13/2016 Discharge date: 02/19/2016  Recommendations for Outpatient Follow-up:  1. Pt will need to follow up with PCP in 2-3 weeks post discharge 2. Please obtain BMP to evaluate electrolytes and kidney function 3. Please also check CBC to evaluate Hg and Hct levels 4. Pt needs to complete 4 more days of Levaquin upon discharge  5. Cardiologist recommended consideration to stress test in future if pt experiences more chest pain   Discharge Diagnoses:  Principal Problem:   Sepsis ([place]) Active Problems:   Anxiety state   Ventral hernia   IDA (iron deficiency anemia)   PAF (paroxysmal atrial fibrillation) (HCC)   CAP (community acquired pneumonia)   Nausea and vomiting   Diabetes mellitus type 2 in obese (HCC)   Elevated troponin  Discharge Condition: Stable  Diet recommendation: Heart healthy diet discussed in details   Brief Narrative:  60 y.o. male with medical history significant of mental retardation, COPD oxygen dependent, PAF, combined CHF last EF 55-60% with grade 2 diastolic dysfunction, HTN, DM, CAD s/p CABG, left carotid stenosis; who presented with complaints of being sick with nausea and vomiting.   ED Course: Upon admission to the emergency department patient was evaluated and seen to have a temperature of 102.51F, pulse 72-75, respirations up to 26, blood pressures as high as 176/100, O2 saturations 97% on 2 L nasal cannula oxygen. Initial lab work revealed a WBC of 17.9, hemoglobin 11.8, platelets 340, troponin 0.13, and a lactic acid of 6.31. Urinalysis was negative for signs of infection. Chest x-ray showed signs of possible developing bronchopulmonary pneumonia. Patient was started on vancomycin and Zosyn and TRH asked to admit for further evaluation.   Assessment and plan: Sepsis secondary to community-acquired  pneumonia/chronic respiratory failure oxygen dependent  - pt clinically improving on Vancomycin and Maxipime,, discharge on oral levaquin to complete the course. - no fevers overnight, pt maintaining oxygen saturations at target range for now - transition to oral ABX Levaquin upon discharge  - continue to monitor   Nausea, vomiting, and diarrhea - possibly related the above - now resolved - advanced diet and pt tolerating well   Elevated Troponin - initial troponin 0.13. EKG showing no ischemic changes. - likely related to demand ischemia in the setting of sepsis - troponins trending down  - cardiology consulted, recommended outpatient follow up with a stress test.  Paroxysmal atrial fibrillation: CHADS2 score = 2 - Continue Pradaxa andmetoprolol  Congestive heart failure, chronic  - Last EF noted to be 55-60% with grade 2 diastolic dysfunction - weight 84 kg this AM - cardiology has cleared for discharge   Iron deficiency anemia - no signs of bleeding  Diabetes mellitus type II - Last A1c was noted to be 5.8 in 08/2015. - reasonable inpatient control   Ventral hernia/Abdominal hernia - Chronic  Multiples skin wounds - partial thickness tissue loss on right UE and LE, Partial and Full thickness wounds on LLE. - Right UE with two partial thickness injuries, the largest of which measures 0.2cm round x 0.1cm deep.  - Two similar areas on the RLE: two partial thickness areas in a 3cm x 3cm area, the largest of which measures 0.2cm round x 0.1cm. - LLE with 5 partial thickness injuries on the pretibial region in a 5cm x 3cm, the largest measures 0.8 x 1.2 x 0.2cm.  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   Consultants: cardiology. ABX: vancomycin and maxipime 7/9 --> transitioned to oral Levaquin to complete therapy  Procedures: none   Procedures/Studies: Dg Chest Portable 1 View  02/13/2016  CLINICAL DATA:  60 year old male with generalized weakness since 8 a.m. this morning. Atrial fibrillation. Nausea and vomiting. Right-sided chest pain. EXAM: PORTABLE CHEST 1 VIEW COMPARISON:  Chest x-ray 09/16/2015. FINDINGS: Lung volumes are low. Diffuse peribronchial cuffing. Patchy ill-defined airspace opacities in the mid to lower left hemithorax. Right lung appears clear. No pleural effusions. No evidence of pulmonary edema. Mild cardiomegaly. The patient is rotated to the left on today's exam, resulting in distortion of the mediastinal contours and reduced diagnostic sensitivity and specificity for mediastinal pathology. Atherosclerosis in the thoracic aorta. Status post median sternotomy for CABG. IMPRESSION: 1. Peribronchial cuffing, concerning for an acute bronchitis. There also appears to be some ill-defined airspace disease throughout the mid to lower left lung which could indicate developing bronchopneumonia. Followup PA and lateral chest X-ray is recommended in 3-4 weeks following trial of antibiotic therapy to ensure resolution and exclude underlying malignancy. 2. Aortic atherosclerosis. 3. Mild cardiomegaly. Electronically Signed   By: Vinnie Langton M.D.   On: 02/13/2016 22:14    Discharge Exam: Filed Vitals:   02/19/16 0847 02/19/16 1136  BP:  109/86  Pulse: 53 52  Temp:  98.5 F (36.9 C)  Resp:  16   Filed Vitals:   02/19/16 0414 02/19/16 0734 02/19/16 0847 02/19/16 1136  BP: 128/55 143/60  109/86  Pulse: 48 50 53 52   Temp: 98.2 F (36.8 C) 98.6 F (37 C)  98.5 F (36.9 C)  TempSrc: Oral Oral  Oral  Resp: 18 16  16   Height:      Weight: 82.918 kg (182 lb 12.8 oz)     SpO2: 94% 95%  99%    General: Pt is alert, follows commands appropriately, not in acute distress Cardiovascular: Regular rate and rhythm, S1/S2 +, no rubs, no gallops Respiratory: Clear to auscultation bilaterally, no wheezing, no crackles, no rhonchi Abdominal: Soft, non tender, non distended, bowel sounds +, no guarding Extremities: no cyanosis, pulses palpable bilaterally DP and PT Neuro: Grossly nonfocal  Discharge Instructions     Medication List    TAKE these medications        acetaminophen 325 MG tablet  Commonly known as:  TYLENOL  Take 2 tablets (650 mg total) by mouth every 6 (six) hours as needed for mild pain.     aspirin 81 MG EC tablet  Take 1 tablet (81 mg total) by mouth daily.     atorvastatin 80 MG tablet  Commonly known as:  LIPITOR  Take 1 tablet (80 mg total) by mouth daily at 6 PM.     clonazePAM 0.5 MG tablet  Commonly known as:  KLONOPIN  Take 1 tablet (0.5 mg total) by mouth at bedtime.     dabigatran 150 MG Caps capsule  Commonly known as:  PRADAXA  Take 150 mg by mouth 2 (two)  times daily.     ferrous gluconate 324 MG tablet  Commonly known as:  FERGON  Take 1 tablet (324 mg total) by mouth daily with breakfast.     fludrocortisone 0.1 MG tablet  Commonly known as:  FLORINEF  Take 0.1 mg by mouth daily.     furosemide 40 MG tablet  Commonly known as:  LASIX  Take 40 mg by mouth daily.     gabapentin 600 MG tablet  Commonly known as:  NEURONTIN  Take 600 mg by mouth 2 (two) times daily.     HYDROcodone-acetaminophen 5-325 MG tablet  Commonly known as:  NORCO/VICODIN  Take 1 tablet by mouth every 6 (six) hours as needed for moderate pain.     hydrocortisone 2.5 % rectal cream  Commonly known as:  ANUSOL-HC  Place 1 application rectally 3 (three) times daily as needed for  hemorrhoids or itching.     Insulin Glargine 100 UNIT/ML Solostar Pen  Commonly known as:  LANTUS  Inject 10 Units into the skin every evening.     levofloxacin 500 MG tablet  Commonly known as:  LEVAQUIN  Take 1 tablet (500 mg total) by mouth daily.     metFORMIN 1000 MG tablet  Commonly known as:  GLUCOPHAGE  Take 1,000 mg by mouth 2 (two) times daily with a meal.     metoprolol tartrate 25 MG tablet  Commonly known as:  LOPRESSOR  Take 0.5 tablets (12.5 mg total) by mouth 2 (two) times daily.     omeprazole 20 MG capsule  Commonly known as:  PRILOSEC  Take 20 mg by mouth daily.     PARoxetine 40 MG tablet  Commonly known as:  PAXIL  Take 40 mg by mouth daily.     polyvinyl alcohol 1.4 % ophthalmic solution  Commonly known as:  LIQUIFILM TEARS  Place 1 drop into both eyes 2 (two) times daily as needed for dry eyes. Natural Tears     Vitamin D3 1000 units Caps  Take 1,000 Units by mouth daily.        Follow-up Information    Follow up with Joseph Maroon, MD.   Specialty:  Family Medicine   Contact information:   [address] [place] [id number] [phone]        The results of significant diagnostics from this hospitalization (including imaging, microbiology, ancillary and laboratory) are listed below for reference.     Microbiology: Recent Results (from the past 240 hour(s))  Blood culture (routine x 2)     Status: None   Collection Time: 02/13/16  9:55 PM  Result Value Ref Range Status   Specimen Description BLOOD LEFT WRIST  Final   Special Requests BOTTLES DRAWN AEROBIC AND ANAEROBIC 5CC   Final   Culture NO GROWTH 5 DAYS  Final   Report Status 02/18/2016 FINAL  Final  Blood culture (routine x 2)     Status: None   Collection Time: 02/13/16  9:55 PM  Result Value Ref Range Status   Specimen Description BLOOD RIGHT ANTECUBITAL  Final   Special Requests BOTTLES DRAWN AEROBIC AND ANAEROBIC 5CC   Final   Culture NO GROWTH 5 DAYS  Final    Report Status 02/18/2016 FINAL  Final  Urine culture     Status: None   Collection Time: 02/13/16 10:27 PM  Result Value Ref Range Status   Specimen Description URINE, CLEAN CATCH  Final   Special Requests NONE  Final   Culture  NO GROWTH  Final   Report Status 02/15/2016 FINAL  Final  MRSA PCR Screening     Status: None   Collection Time: 02/14/16  9:14 AM  Result Value Ref Range Status   MRSA by PCR NEGATIVE NEGATIVE Final    Comment:        The GeneXpert MRSA Assay (FDA approved for NASAL specimens only), is one component of a comprehensive MRSA colonization surveillance program. It is not intended to diagnose MRSA infection nor to guide or monitor treatment for MRSA infections.      Labs: Basic Metabolic Panel:  Recent Labs Lab 02/14/16 0420 02/15/16 0406 02/17/16 0459 02/18/16 0534 02/19/16 0508  NA 137 139 139 137 138  K 3.9 3.5 3.7 3.7 4.0  CL 100* 106 102 101 97*  CO2 27 28 28 31 31   GLUCOSE 141* 129* 140* 164* 177*  BUN 14 15 13 14 14   CREATININE 0.85 0.89 0.83 0.89 0.90  CALCIUM 8.0* 7.8* 8.5* 8.7* 8.8*   Liver Function Tests:  Recent Labs Lab 02/13/16 2155  AST 32  ALT 14*  ALKPHOS 58  BILITOT 0.3  PROT 7.0  ALBUMIN 3.1*    Recent Labs Lab 02/13/16 2155  LIPASE 22   CBC:  Recent Labs Lab 02/13/16 2155 02/14/16 0420 02/15/16 0406 02/17/16 0459 02/18/16 0534 02/19/16 0508  WBC 17.9* 13.8* 8.4 8.6 7.9 7.2  NEUTROABS 15.8*  --   --   --   --   --   HGB 11.8* 10.9* 9.9* 10.1* 10.1* 9.9*  HCT 38.7* 36.3* 33.7* 33.6* 33.9* 32.5*  MCV 78.8 79.6 79.5 78.9 78.5 77.8*  PLT 340 269 220 221 217 217   Cardiac Enzymes:  Recent Labs Lab 02/14/16 0721 02/14/16 1221 02/14/16 1656 02/14/16 2148 02/15/16 0406  TROPONINI 0.62* 0.49* 0.29* 0.19* 0.17*   BNP: BNP (last 3 results)  Recent Labs  05/19/15 0855  BNP 50.5   CBG:  Recent Labs Lab 02/18/16 1618 02/18/16 1955 02/19/16 0519 02/19/16 0737 02/19/16 1125  GLUCAP  193* 198* 196* 178* 202*   SIGNED: Time coordinating discharge: 30 minutes  Telesia Ates, MD  [facility] 02/19/2016, 12:02 PM Pager [phone]  If 7PM-7AM, please contact night-coverage [email or web address] Password [email or web address]

## 2016-02-19 NOTE — Progress Notes (Signed)
Physical Therapy Treatment Patient Details Name: NORM MARBLE MRN: [id number] DOB: 12/30/1955 Today's Date: 02/19/2016    History of Present Illness 60 y.o. male with medical history significant of mental retardation, COPD oxygen dependent, PAF, combined CHF last EF 55-60% with grade 2 diastolic dysfunction, HTN, DM, CAD s/p CABG, left carotid stenosis admitted for sepsis with CAP.    PT Comments    Pt was seen with nurse tech already walking in the hallway. Pt walked with tech 150 ft before hand off to PT. Pt was eager to move and get out of his room. Slight gait deviations as listed.  Pt is progressing towards goals and would benefit from PT in increase his functional independence.   Follow Up Recommendations  No PT follow up (Already set up at [facility] for therapy)     Equipment Recommendations  None recommended by PT    Recommendations for Other Services       Precautions / Restrictions Restrictions Weight Bearing Restrictions: No    Mobility  Bed Mobility                  Transfers Overall transfer level: Needs assistance Equipment used: Rolling walker (2 wheeled) Transfers: Sit to/from Stand Sit to Stand: Supervision            Ambulation/Gait Ambulation/Gait assistance: Supervision Ambulation Distance (Feet): 200 Feet Assistive device: Rolling walker (2 wheeled) Gait Pattern/deviations: Step-through pattern;Decreased stride length     General Gait Details: Pt had already walked 150 ft with nurse tech upon arrival. Pt needed cues for upright posture during ambulation. Pt had outwardly turned feet while walking. 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    Stairs Stairs: Yes Stairs assistance: Min guard Stair Management: Alternating pattern;One rail Right;One rail Left Number of Stairs: 10 General stair comments: assist for safety while ascending and descending stairs  Wheelchair Mobility    Modified Rankin (Stroke Patients Only)        Balance Overall balance assessment: Needs assistance Sitting-balance support: Feet supported Sitting balance-Leahy Scale: Good   Postural control:  (foward trunk flexion. cues needed to straighten trunk)                          Cognition Arousal/Alertness: Awake/alert Behavior During Therapy: WFL for tasks assessed/performed Overall Cognitive Status: Within Functional Limits for tasks assessed                      Exercises      General Comments General comments (skin integrity, edema, etc.): Pt was eager to move and wanting to be discharged today. He liked to joke around with therapist. Pt willing to do what is asked, such as stairs even though nurse tech said he did not want to do them.      Pertinent Vitals/Pain Pain Assessment: Faces Faces Pain Scale: No hurt Pain Intervention(s): Monitored during session           PT Goals (current goals can now be found in the care plan section) Acute Rehab PT Goals Patient Stated Goal: To return home and back to [facility]    Frequency  Min 3X/week    PT Plan      Co-evaluation             End of Session Equipment Utilized During Treatment: Gait belt Activity Tolerance: Patient tolerated treatment well Patient left: in chair;with call bell/phone within reach     Time:  [id number] PT Time Calculation (min) (ACUTE ONLY): 14 min  Charges:                       G CodesNash Mantis, [place]le [place] [phone]  02/19/2016, 2:07 PM

## 2016-03-01 ENCOUNTER — Telehealth (HOSPITAL_COMMUNITY): Payer: Self-pay | Admitting: *Deleted

## 2016-03-01 NOTE — Telephone Encounter (Signed)
Attempted to Leave message on voicemail in reference to upcoming appointment scheduled for 03/03/16. Voicemail box not set up therefore unable to leave message. [name], [name]

## 2016-03-03 ENCOUNTER — Ambulatory Visit (HOSPITAL_COMMUNITY): Payer: Medicare (Managed Care) | Attending: Cardiology

## 2016-03-03 DIAGNOSIS — R9439 Abnormal result of other cardiovascular function study: Secondary | ICD-10-CM | POA: Insufficient documentation

## 2016-03-03 DIAGNOSIS — I5042 Chronic combined systolic (congestive) and diastolic (congestive) heart failure: Secondary | ICD-10-CM | POA: Insufficient documentation

## 2016-03-03 DIAGNOSIS — I110 Hypertensive heart disease with heart failure: Secondary | ICD-10-CM | POA: Insufficient documentation

## 2016-03-03 DIAGNOSIS — R079 Chest pain, unspecified: Secondary | ICD-10-CM | POA: Insufficient documentation

## 2016-03-03 DIAGNOSIS — E119 Type 2 diabetes mellitus without complications: Secondary | ICD-10-CM | POA: Insufficient documentation

## 2016-03-03 DIAGNOSIS — R002 Palpitations: Secondary | ICD-10-CM | POA: Diagnosis not present

## 2016-03-03 DIAGNOSIS — I779 Disorder of arteries and arterioles, unspecified: Secondary | ICD-10-CM | POA: Insufficient documentation

## 2016-03-03 DIAGNOSIS — R0609 Other forms of dyspnea: Secondary | ICD-10-CM | POA: Insufficient documentation

## 2016-03-03 MED ORDER — TECHNETIUM TC 99M TETROFOSMIN IV KIT
33.0000 | PACK | Freq: Once | INTRAVENOUS | Status: AC | PRN
  Administered 2016-03-03: 33 via INTRAVENOUS
  Filled 2016-03-03: qty 33

## 2016-03-07 ENCOUNTER — Ambulatory Visit (HOSPITAL_COMMUNITY): Payer: Medicare (Managed Care) | Attending: Cardiovascular Disease

## 2016-03-07 DIAGNOSIS — I110 Hypertensive heart disease with heart failure: Secondary | ICD-10-CM | POA: Diagnosis not present

## 2016-03-07 LAB — MYOCARDIAL PERFUSION IMAGING
CHL CUP NUCLEAR SDS: 3
CHL CUP NUCLEAR SSS: 13
LHR: 0.36
LV sys vol: 62 mL
LVDIAVOL: 137 mL (ref 62–150)
SRS: 10
TID: 0.83

## 2016-03-07 MED ORDER — REGADENOSON 0.4 MG/5ML IV SOLN
0.4000 mg | Freq: Once | INTRAVENOUS | Status: AC
  Administered 2016-03-07: 0.4 mg via INTRAVENOUS

## 2016-03-07 MED ORDER — TECHNETIUM TC 99M TETROFOSMIN IV KIT
32.6000 | PACK | Freq: Once | INTRAVENOUS | Status: AC | PRN
  Administered 2016-03-07: 33 via INTRAVENOUS
  Filled 2016-03-07: qty 33

## 2016-03-09 ENCOUNTER — Encounter: Payer: Self-pay | Admitting: Physician Assistant

## 2016-03-09 ENCOUNTER — Encounter (HOSPITAL_COMMUNITY): Payer: Medicare (Managed Care)

## 2016-03-09 ENCOUNTER — Ambulatory Visit: Payer: Medicare (Managed Care) | Admitting: Vascular Surgery

## 2016-03-15 ENCOUNTER — Ambulatory Visit: Payer: Medicare (Managed Care) | Admitting: Physician Assistant

## 2016-04-05 NOTE — Progress Notes (Signed)
Cardiology Office Note:    Date:  04/06/2016   ID:  [name], DOB December 02, 1955, MRN [id number]  PCP:  [name], MD  Cardiologist: [name] /  Richardson Dopp, PA-C  Electrophysiologist: N/a VVS: Dr. Scot Dock TCTS: Dr. Cyndia Bent  Referring MD: Janifer Adie, MD   Chief Complaint  Patient presents with  . Hospitalization Follow-up    elevated troponin in setting of sepsis syndrome, pneumonia in 02/2016    History of Present Illness:    [name] is a 60 y.o. male with a hx of CAD, combined systolic and diastolic CHF, ischemic CM, PAF, HTN, diabetes, HL, RTA Type 4 with hyperkalemia, prior stroke, carotid stenosis, COPD, intellectual disability, diabetic ulcer s/p R midfoot amputation 2014.   CHADS2-VASc=6.   Admitted 3/16 with NSTEMI in the setting of sepsis syndrome c/b acute hypoxic respiratory failure secondary to a/c diastolic CHF, AECOPD and AF with RVR. Given his multiple comorbid conditions he was treated conservatively.  Then in 8/16, he was diagnosed with progressive L carotid artery disease.  An abnormal nuclear study prompted a LHC that demonstrated 3 v CAD.  He eventually underwent CABG with L-LAD, S-RI, S-OM2 + L CEA on 08/13/15. He had post op AF requiring amiodarone but this was DC'd 2/2 to bradycardia. FU Echo in 4/17 demonstrated improved LVF with EF [id number], mod diastolic dysfunction and mod pulmonary HTN.    Last seen in 6/17.  He was admitted  7/8-7/14 with sepsis 2/2 CAP.  Lactate was 6.31.  He had mildly elevated troponin levels.  He was evaluated by Cardiology and noted to have reduced LVF with EF 45-50% on Echo.  His troponin elevation was thought to be due to demand ischemia.  OP nuclear stress test was arranged.  This was done last month and demonstrated EF 55%, inf defect (? Infarct), no ischemia.  Study was low risk.  He returns for post hospital FU.  Here alone.  He is still going to [place] 3 days a week.  BP is usually optimal  there per his report.  His sugars have been "ok."  He has occasional atypical chest pain.  He exercises at [facility] on the stationary bike without chest pain or dyspnea.  He denies orthopnea, PND, edema.  He has insomnia.     Prior CV studies that were reviewed today include:    Myoview 03/07/16 Nuclear stress EF: 55%. There was no ST segment deviation noted during stress. This is a low risk study. The left ventricular ejection fraction is normal (55-65%). Findings consistent with prior myocardial infarction. 1. Fixed medium-sized, moderate-intensity basal to mid inferior perfusion defect.  Fixed small, moderate-intensity apical anterior perfusion defect.  No ischemia.  The inferior defect in particular appears fairly extensive, so hard to label it as artifact, but wall motion appears normal throughout.  Cannot rule out prior infarction.  2. Normal LV systolic function and wall motion.  3. Low risk study given lack of ischemia.   Echo 02/15/16 Mild LVH, EF 45-50%, diffuse HK, mild LAE, trivial pericardial effusion  Echo 11/12/15 EF 55-60%, normal wall motion, grade 2 diastolic dysfunction, MAC, mild mitral stenosis, mild LAE, PASP 55 mmHg, moderate pulmonary hypertension  LHC 04/30/15 LAD: Ostial and mid 95%, distal 40%, ostial D1 100%, ostial D2 85% RI: Ostial 65% LCx: Proximal to mid 70%, mid 65%, OM2 90% RCA: Ostial 80%, proximal to distal 100%, distal 100%, RPDA 85%, RPAVB 100% EF 35-45%  Myoview 04/23/15 Large, mostly reversible inferior, apical  and inferolateral reversible perfusion defect suggestive of ischemia (SDS 10, Extent 37%). This study was not-gated due to atrial fibrillation. This is a high-risk study based on the size and severity of the perfusion defect.  Carotid US 8/16 R 60-79% L 80-99%  Echo 12/18/14 Mild LVH, EF 55%, wall motion abnormalities could not be excluded, MAC, trivial MR, mild LAE  Echo 10/16/12 Mild LVH, vigorous LVF, EF 65-70%, normal wall motion,  grade 1 diastolic dysfunction  Nuclear study 09/06/12 Myoview scan with small apical defect Seen in horizontal images only. Cannot exclude tiny region of apical ischemia vs shift soft tissue. Otherwise normal perfusion. Overall low risk scan. LV Ejection Fraction: 69%. LV Wall Motion: NL LV Function; NL Wall Motion   Past Medical History:  Diagnosis Date  . Abdominal hernia    Chronic, not a good surgical candidate  . Abscess, abdomen ([place]) 12/31/2010   Referred to Wound Care in 01/2011 because of multiple abd abscess with VERY large ventral hernia (please look at image of CT abd/pelvis 09/2010).  Because of hernia I was hesitant to I&D.      Marland Kitchen Anemia    History of Iron Def Anemia  . Anxiety   . Carotid artery occlusion   . Chronic diastolic CHF (congestive heart failure) (HCC)    takes Lasix  . Chronic low back pain   . COPD (chronic obstructive pulmonary disease) ([place])   . CVA (cerebral infarction) 09/2010   Bilateral with Left > Right  . Diabetes mellitus   . GERD (gastroesophageal reflux disease)   . History of nuclear stress test    Myoview 9/16:  Inferior, apical and inf-lateral ischemia; not gated; High Risk  . Hx of echocardiogram    Echo 5/16:  Mild LVH, EF 55%, indeterm. diast function, WMA could not be ruled out, MAC, trivial MR, mild LAE, normal RVF //  b. Echo 4/17: EF 55-60%, no RWMA, Gr 2 DD, Ao sclerosis, MAC, mild MS, mild LAE, PASP 55 mHg  . Hyperlipidemia   . Hypertension   . Intellectual disability    Sister helps to take care of him and takes him to appts  . Itching    all over body; pt scratches and has sores on bilateral arms and abdomen  . Lung nodule   . Myocardial infarction [facility]) 2016 ?   Heart attack  (  Per  pt. )  . Obesity   . Oxygen dependent    wears 2 liters at bedtime and when needed  . PAF (paroxysmal atrial fibrillation) ([place]) 06/2009   CHADS score 2 (HTN, DM), was not on coumadin, but now on Pradaxa for Afib  . Pneumonia    hx of  .  Shortness of breath dyspnea    with exertion  . Stroke ([place])   . Tobacco user    Smokes 1ppd for multiple years.  Quit after hosp 09/2010.  . Wears glasses     Past Surgical History:  Procedure Laterality Date  . AMPUTATION Right 03/29/2013   Procedure: AMPUTATION RAY;  Surgeon: Newt Minion, MD;  Location: [place];  Service: Orthopedics;  Laterality: Right;  Right Foot 3rd and Possible 4th Ray Amputation  . AMPUTATION Right 04/23/2013   Procedure: AMPUTATION RIGHT MID-FOOT;  Surgeon: Newt Minion, MD;  Location: [place];  Service: Orthopedics;  Laterality: Right;  . arm surgery Left    as a child  . CARDIAC CATHETERIZATION N/A 04/30/2015   Procedure: Left Heart Cath and Coronary Angiography;  Surgeon: Belva Crome, MD;  Location: [place] [facility];  Service: Cardiovascular;  Laterality: N/A;  . Carotid arteriogram  10/2010   30% right ICA stenosis, 40% left ICA stenosis   . CAROTID ENDARTERECTOMY Left 08-13-15   CEA  . CATARACT EXTRACTION, BILATERAL    . COLONOSCOPY WITH PROPOFOL N/A 06/10/2014   Procedure: COLONOSCOPY WITH PROPOFOL;  Surgeon: Jerene Bears, MD;  Location: [facility];  Service: Gastroenterology;  Laterality: N/A;  . CORONARY ARTERY BYPASS GRAFT N/A 08/13/2015   Procedure: CORONARY ARTERY BYPASS GRAFTING (CABG), ON PUMP, TIMES THREE, USING LEFT INTERNAL MAMMARY ARTERY, RIGHT GREATER SAPHENOUS VEIN HARVESTED ENDOSCOPICALLY;  Surgeon: [name], MD;  Location: [place];  Service: Open Heart Surgery;  Laterality: N/A;  . DEBRIDMENT OF DECUBITUS ULCER Right 02/13/2013  . ENDARTERECTOMY Left 08/13/2015   Procedure: ENDARTERECTOMY CAROTID;  Surgeon: Angelia Mould, MD;  Location: [place];  Service: Vascular;  Laterality: Left;  . ESOPHAGOGASTRODUODENOSCOPY (EGD) WITH PROPOFOL N/A 06/10/2014   Procedure: ESOPHAGOGASTRODUODENOSCOPY (EGD) WITH PROPOFOL;  Surgeon: Jerene Bears, MD;  [facility];  Service: Gastroenterology;  Laterality: N/A;  . GIVENS CAPSULE STUDY N/A  07/09/2014   Procedure: GIVENS CAPSULE STUDY;  Surgeon: Jerene Bears, MD;  [facility]: [facility];  Service: Gastroenterology;  Laterality: N/A;  . I&D EXTREMITY Right 02/13/2013   Procedure: IRRIGATION AND DEBRIDEMENT FOOT ULCER;  Surgeon: Johnny Bridge, MD;  Location: [place];  Service: Orthopedics;  Laterality: Right;  PULSE LAVAGE  . MULTIPLE TOOTH EXTRACTIONS    . TEE WITHOUT CARDIOVERSION N/A 08/13/2015   Procedure: TRANSESOPHAGEAL ECHOCARDIOGRAM (TEE);  Surgeon: [name], MD;  Location: [place];  Service: Open Heart Surgery;  Laterality: N/A;  . TRANSESOPHAGEAL ECHOCARDIOGRAM  09/2010   No ASD or PFO. EF 60-65%.  Normal systolic function. No evidence of thrombus.   . TRANSTHORACIC ECHOCARDIOGRAM  09/2010    The cavity size was normal. Systolic function was vigorous.  EF 65-70%.  Normal wall funciton.     Current Medications: Outpatient Medications Prior to Visit  Medication Sig Dispense Refill  . aspirin EC 81 MG EC tablet Take 1 tablet (81 mg total) by mouth daily. 90 tablet 3  . Cholecalciferol (VITAMIN D3) 1000 UNITS CAPS Take 1,000 Units by mouth daily.    . clonazePAM (KLONOPIN) 0.5 MG tablet Take 1 tablet (0.5 mg total) by mouth at bedtime. 30 tablet 0  . dabigatran (PRADAXA) 150 MG CAPS capsule Take 150 mg by mouth 2 (two) times daily.    . ferrous gluconate (FERGON) 324 MG tablet Take 1 tablet (324 mg total) by mouth daily with breakfast. 60 tablet 0  . fludrocortisone (FLORINEF) 0.1 MG tablet Take 0.1 mg by mouth daily.    . furosemide (LASIX) 40 MG tablet Take 40 mg by mouth daily.    Marland Kitchen gabapentin (NEURONTIN) 600 MG tablet Take 600 mg by mouth 2 (two) times daily.    . hydrocortisone (ANUSOL-HC) 2.5 % rectal cream Place 1 application rectally 3 (three) times daily as needed for hemorrhoids or itching.    . Insulin Glargine (LANTUS) 100 UNIT/ML Solostar Pen Inject 10 Units into the skin every evening.    . metFORMIN (GLUCOPHAGE) 1000 MG tablet Take 1,000 mg by mouth 2 (two)  times daily with a meal.    . metoprolol tartrate (LOPRESSOR) 25 MG tablet Take 0.5 tablets (12.5 mg total) by mouth 2 (two) times daily. 60 tablet 0  . omeprazole (PRILOSEC) 20 MG capsule Take 20  mg by mouth daily.    Marland Kitchen PARoxetine (PAXIL) 40 MG tablet Take 40 mg by mouth daily.     . polyvinyl alcohol (LIQUIFILM TEARS) 1.4 % ophthalmic solution Place 1 drop into both eyes 2 (two) times daily as needed for dry eyes. Natural Tears    . acetaminophen (TYLENOL) 325 MG tablet Take 2 tablets (650 mg total) by mouth every 6 (six) hours as needed for mild pain. (Patient not taking: Reported on 04/06/2016)    . atorvastatin (LIPITOR) 80 MG tablet Take 1 tablet (80 mg total) by mouth daily at 6 PM. (Patient not taking: Reported on 04/06/2016) 45 tablet 0  . HYDROcodone-acetaminophen (NORCO/VICODIN) 5-325 MG tablet Take 1 tablet by mouth every 6 (six) hours as needed for moderate pain. (Patient not taking: Reported on 04/06/2016) 30 tablet 0  . levofloxacin (LEVAQUIN) 500 MG tablet Take 1 tablet (500 mg total) by mouth daily. (Patient not taking: Reported on 04/06/2016) 4 tablet 0   No facility-administered medications prior to visit.       Allergies:   Penicillins   Social History   Social History  . Marital status: Single    Spouse name: N/A  . Number of children: 0  . Years of education: N/A   Occupational History  .  Disabled   Social History Main Topics  . Smoking status: Former Smoker    Packs/day: 1.00    Years: 42.00    Types: Cigarettes, Pipe    Quit date: 11/10/2010  . Smokeless tobacco: Former Systems developer    Quit date: 10/02/2010  . Alcohol use No     Comment: " last drink was 2003" 08/11/15  . Drug use: No  . Sexual activity: Not Asked   Other Topics Concern  . None   Social History Narrative   Released from prison in 2003 after serving 2 years for indecency with a minor.    He smokes cigarettes greater than 30 years, also smoked a pipe.  Quit tobacco 09/2010.   Denies EtOH and drugs.        Family History:  The patient's family history includes Breast cancer in his maternal grandmother; Heart attack in his maternal grandmother; Heart disease in his brother, father, maternal grandmother, and mother; Hypertension in his sister; Peripheral vascular disease in his father; Stomach cancer in his maternal uncle.   ROS:   Please see the history of present illness.    ROS All other systems reviewed and are negative.   EKGs/Labs/Other Test Reviewed:    EKG:  EKG is  ordered today.  The ekg ordered today demonstrates NSR, HR 64, RBBB, QTC 495 ms, inferior and anterolateral Q waves, no change from prior tracing  Recent Labs: 05/19/2015: B Natriuretic Peptide 50.5 08/14/2015: Magnesium 2.2 02/13/2016: ALT 14 02/19/2016: BUN 14; Creatinine, Ser 0.90; Hemoglobin 9.9; Platelets 217; Potassium 4.0; Sodium 138   Recent Lipid Panel    Component Value Date/Time   CHOL 99 (L) 07/14/2015 1149   TRIG 156 (H) 07/14/2015 1149   HDL 21 (L) 07/14/2015 1149   CHOLHDL 4.7 07/14/2015 1149   VLDL 31 (H) 07/14/2015 1149   LDLCALC 47 07/14/2015 1149   LDLDIRECT 54 08/23/2012 1447     Physical Exam:    VS:  BP 140/60   Pulse 64   Ht 5' (1.524 m)   Wt 185 lb (83.9 kg)   SpO2 95%   BMI 36.13 kg/m     Wt Readings from Last 3 Encounters:  04/06/16 185  lb (83.9 kg)  02/19/16 182 lb 12.8 oz (82.9 kg)  01/28/16 187 lb 1.9 oz (84.9 kg)     Physical Exam  Constitutional: He is oriented to person, place, and time. He appears well-developed and well-nourished. No distress.  HENT:  Head: Normocephalic and atraumatic.  Eyes: No scleral icterus.  Neck: No JVD present.  Cardiovascular: Normal rate, regular rhythm and normal heart sounds.   No murmur heard. Pulmonary/Chest: He has decreased breath sounds. He has no wheezes. He has no rhonchi. He has no rales.  Abdominal: Soft. There is no tenderness. A hernia is present.  Lg ventral hernia noted  Musculoskeletal: He exhibits edema.  Trace  bilateral LE edema  Neurological: He is alert and oriented to person, place, and time.  Skin: Skin is warm and dry.  Psychiatric: He has a normal mood and affect.    ASSESSMENT:    1. Coronary artery disease involving native coronary artery of native heart without angina pectoris   2. Chronic combined systolic and diastolic CHF (congestive heart failure) ([place])   3. Ischemic cardiomyopathy   4. PAF (paroxysmal atrial fibrillation) ([place])   5. Essential hypertension   6. Bilateral carotid artery disease ([place])    PLAN:    In order of problems listed above:  1. CAD - History of non-STEMI in 3/16 while hospitalized for acute, multiple medical illnesses. LHC in 9/16 with 3v CAD and he is s/p CABG + L CEA in 1/17. He was admitted last month with elevated troponin levels likely 2/2 demand ischemia from sepsis syndrome in the setting of CAP.  EF was slightly lower at 45-50%.  FU OP nuclear stress test was low risk without significant ischemia.  Stress test findings were d/w the patient today. Med Rx will be continued.  -  Continue ASA, Lipitor, beta-blocker.   2. Chronic combined systolic and diastolic CHF -  Volume overall stable.  He is NYHA 2-2b.  Continue current Rx.   3. Ischemic Cardiomyopathy - EF was down to 35-45% prior to his CABG but returned to normal by echo in 4/17.  EF was down slightly in the setting of sepsis.  But, EF on recent nuclear study was normal.  Will continue beta-blocker. 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 ACE/ARB should be avoided.   4. PAF -He is in NSR today.  Continue Pradaxa for anticoagulation. CHADS2-VASc=6. Hgb in 7/17 was stable.  Creatinine ok in 7/17.     5. HTN - BP stable.  Continue current Rx.   6. Carotid stenosis, bilateral - s/p L CEA. He follows with [facility]VS.    Medication Adjustments/Labs and Tests Ordered: Current medicines are reviewed at length with the patient today.  Concerns regarding medicines are outlined above.   Medication changes, Labs and Tests ordered today are outlined in the Patient Instructions noted below. Patient Instructions  Medication Instructions:  No changes.  See your medication list.   Labwork: None   Testing/Procedures: None   Follow-Up: Richardson Dopp, PA-C in 4 months.   Any Other Special Instructions Will Be Listed Below (If Applicable).  If you need a refill on your cardiac medications before your next appointment, please call your pharmacy.   Signed, Richardson Dopp, PA-C  04/06/2016 2:22 PM    [facility] [address], [place], [place]  [address] Phone: [phone]; Fax: [phone]

## 2016-04-06 ENCOUNTER — Ambulatory Visit (INDEPENDENT_AMBULATORY_CARE_PROVIDER_SITE_OTHER): Payer: Medicare (Managed Care) | Admitting: Physician Assistant

## 2016-04-06 ENCOUNTER — Encounter: Payer: Self-pay | Admitting: Physician Assistant

## 2016-04-06 VITALS — BP 140/60 | HR 64 | Ht 60.0 in | Wt 185.0 lb

## 2016-04-06 DIAGNOSIS — I739 Peripheral vascular disease, unspecified: Secondary | ICD-10-CM

## 2016-04-06 DIAGNOSIS — I2510 Atherosclerotic heart disease of native coronary artery without angina pectoris: Secondary | ICD-10-CM | POA: Diagnosis not present

## 2016-04-06 DIAGNOSIS — I255 Ischemic cardiomyopathy: Secondary | ICD-10-CM | POA: Diagnosis not present

## 2016-04-06 DIAGNOSIS — I480 Paroxysmal atrial fibrillation: Secondary | ICD-10-CM | POA: Diagnosis not present

## 2016-04-06 DIAGNOSIS — I5042 Chronic combined systolic (congestive) and diastolic (congestive) heart failure: Secondary | ICD-10-CM

## 2016-04-06 DIAGNOSIS — I779 Disorder of arteries and arterioles, unspecified: Secondary | ICD-10-CM

## 2016-04-06 DIAGNOSIS — I10 Essential (primary) hypertension: Secondary | ICD-10-CM

## 2016-04-06 NOTE — Patient Instructions (Signed)
Medication Instructions:  No changes.  See your medication list.   Labwork: None   Testing/Procedures: None   Follow-Up: Richardson Dopp, PA-C in 4 months.   Any Other Special Instructions Will Be Listed Below (If Applicable).  If you need a refill on your cardiac medications before your next appointment, please call your pharmacy.

## 2016-04-29 ENCOUNTER — Encounter: Payer: Self-pay | Admitting: Vascular Surgery

## 2016-05-04 ENCOUNTER — Ambulatory Visit (INDEPENDENT_AMBULATORY_CARE_PROVIDER_SITE_OTHER): Payer: Medicare (Managed Care) | Admitting: Vascular Surgery

## 2016-05-04 ENCOUNTER — Ambulatory Visit (HOSPITAL_COMMUNITY)
Admission: RE | Admit: 2016-05-04 | Discharge: 2016-05-04 | Disposition: A | Discharge status: Home / Self Care | Payer: Medicare (Managed Care) | Source: Ambulatory Visit | Attending: Vascular Surgery | Admitting: Vascular Surgery

## 2016-05-04 ENCOUNTER — Encounter: Payer: Self-pay | Admitting: Vascular Surgery

## 2016-05-04 VITALS — BP 133/64 | HR 59 | Temp 98.4°F | Resp 18 | Ht 60.0 in | Wt 182.0 lb

## 2016-05-04 DIAGNOSIS — Z48812 Encounter for surgical aftercare following surgery on the circulatory system: Secondary | ICD-10-CM

## 2016-05-04 DIAGNOSIS — I6523 Occlusion and stenosis of bilateral carotid arteries: Secondary | ICD-10-CM | POA: Insufficient documentation

## 2016-05-04 LAB — VAS US CAROTID
LCCAPDIAS: 15 cm/s
LCCAPSYS: 75 cm/s
LEFT ECA DIAS: -14 cm/s
LEFT VERTEBRAL DIAS: -6 cm/s
Left CCA dist dias: -15 cm/s
Left CCA dist sys: -62 cm/s
Left ICA dist dias: -16 cm/s
Left ICA dist sys: -75 cm/s
Left ICA prox dias: -56 cm/s
Left ICA prox sys: -232 cm/s
RCCADSYS: -60 cm/s
RCCAPDIAS: 11 cm/s
RCCAPSYS: 70 cm/s
RIGHT CCA MID DIAS: -9 cm/s
RIGHT ECA DIAS: -7 cm/s
RIGHT VERTEBRAL DIAS: -1 cm/s

## 2016-05-04 NOTE — Progress Notes (Signed)
Patient name: [name] MRN: [id number] DOB: Dec 22, 1955 Sex: male  REASON FOR VISIT: Follow up of carotid disease.  HPI: [name] is a 60 y.o. male who presented for coronary artery bypass surgery and was found to have a critical left carotid stenosis. For this reason he underwent a combined left carotid endarterectomy and CABG on 08/13/2015. I last saw him on 09/09/2015. He was doing well. He has a 40-59% right carotid stenosis and I set him up for a 6 month follow up visit.  Since I saw him last, he denies any history of stroke, TIAs, expressive or receptive aphasia, or amaurosis fugax.  He does occasionally use oxygen at home.  Vascular Quality Initiative: The patient is on aspirin and is on a statin.  Past Medical History:  Diagnosis Date  . Abdominal hernia    Chronic, not a good surgical candidate  . Abscess, abdomen ([place]) 12/31/2010   Referred to Wound Care in 01/2011 because of multiple abd abscess with VERY large ventral hernia (please look at image of CT abd/pelvis 09/2010).  Because of hernia I was hesitant to I&D.      Marland Kitchen Anemia    History of Iron Def Anemia  . Anxiety   . Carotid artery occlusion   . Chronic diastolic CHF (congestive heart failure) (HCC)    takes Lasix  . Chronic low back pain   . COPD (chronic obstructive pulmonary disease) ([place])   . CVA (cerebral infarction) 09/2010   Bilateral with Left > Right  . Diabetes mellitus   . GERD (gastroesophageal reflux disease)   . History of nuclear stress test    Myoview 9/16:  Inferior, apical and inf-lateral ischemia; not gated; High Risk  . Hx of echocardiogram    Echo 5/16:  Mild LVH, EF 55%, indeterm. diast function, WMA could not be ruled out, MAC, trivial MR, mild LAE, normal RVF //  b. Echo 4/17: EF 55-60%, no RWMA, Gr 2 DD, Ao sclerosis, MAC, mild MS, mild LAE, PASP 55 mHg  . Hyperlipidemia   . Hypertension   . Intellectual disability    Sister helps to take care of him and takes him to appts    . Itching    all over body; pt scratches and has sores on bilateral arms and abdomen  . Lung nodule   . Myocardial infarction [facility]) 2016 ?   Heart attack  (  Per  pt. )  . Obesity   . Oxygen dependent    wears 2 liters at bedtime and when needed  . PAF (paroxysmal atrial fibrillation) ([place]) 06/2009   CHADS score 2 (HTN, DM), was not on coumadin, but now on Pradaxa for Afib  . Pneumonia    hx of  . Shortness of breath dyspnea    with exertion  . Stroke ([place])   . Tobacco user    Smokes 1ppd for multiple years.  Quit after hosp 09/2010.  . Wears glasses     Family History  Problem Relation Age of Onset  . Heart disease Mother   . Hypertension Sister   . Heart disease Brother   . Heart disease Father   . Peripheral vascular disease Father   . Heart disease Maternal Grandmother   . Heart attack Maternal Grandmother   . Breast cancer Maternal Grandmother   . Stomach cancer Maternal Uncle   . Colon cancer Neg Hx   . Stroke Neg Hx     SOCIAL HISTORY: Social History  Substance Use Topics  . Smoking status: Former Smoker    Packs/day: 1.00    Years: 42.00    Types: Cigarettes, Pipe    Quit date: 11/10/2010  . Smokeless tobacco: Former Systems developer    Quit date: 10/02/2010  . Alcohol use No     Comment: " last drink was 2003" 08/11/15    Allergies  Allergen Reactions  . Penicillins Hives, Nausea And Vomiting, Swelling and Other (See Comments)    Has patient had a PCN reaction causing immediate rash, facial/tongue/throat swelling, SOB or lightheadedness with hypotension: YES Has patient had a PCN reaction causing severe rash involving mucus membranes or skin necrosis: No Has patient had a PCN reaction that required hospitalization No Has patient had a PCN reaction occurring within the last 10 years: No If all of the above answers are "NO", then may proceed with Cephalosporin use.     Current Outpatient Prescriptions  Medication Sig Dispense Refill  . acetaminophen (TYLENOL) 325  MG tablet Take 650 mg by mouth 3 (three) times daily as needed for mild pain.    Marland Kitchen HYDROcodone-acetaminophen (NORCO) 5-325 MG tablet Take 1 tablet by mouth daily.    Marland Kitchen aspirin EC 81 MG EC tablet Take 1 tablet (81 mg total) by mouth daily. 90 tablet 3  . atorvastatin (LIPITOR) 80 MG tablet Take 80 mg by mouth daily.    . Cholecalciferol (VITAMIN D3) 1000 UNITS CAPS Take 1,000 Units by mouth daily.    . clonazePAM (KLONOPIN) 0.5 MG tablet Take 1 tablet (0.5 mg total) by mouth at bedtime. 30 tablet 0  . dabigatran (PRADAXA) 150 MG CAPS capsule Take 150 mg by mouth 2 (two) times daily.    . ferrous gluconate (FERGON) 324 MG tablet Take 1 tablet (324 mg total) by mouth daily with breakfast. 60 tablet 0  . fludrocortisone (FLORINEF) 0.1 MG tablet Take 0.1 mg by mouth daily.    . furosemide (LASIX) 40 MG tablet Take 40 mg by mouth daily.    Marland Kitchen gabapentin (NEURONTIN) 600 MG tablet Take 600 mg by mouth 2 (two) times daily.    . hydrocortisone (ANUSOL-HC) 2.5 % rectal cream Place 1 application rectally 3 (three) times daily as needed for hemorrhoids or itching.    . Insulin Glargine (LANTUS) 100 UNIT/ML Solostar Pen Inject 10 Units into the skin every evening.    . metFORMIN (GLUCOPHAGE) 1000 MG tablet Take 1,000 mg by mouth 2 (two) times daily with a meal.    . metoprolol tartrate (LOPRESSOR) 25 MG tablet Take 0.5 tablets (12.5 mg total) by mouth 2 (two) times daily. 60 tablet 0  . omeprazole (PRILOSEC) 20 MG capsule Take 20 mg by mouth daily.    Marland Kitchen PARoxetine (PAXIL) 40 MG tablet Take 40 mg by mouth daily.     . polyvinyl alcohol (LIQUIFILM TEARS) 1.4 % ophthalmic solution Place 1 drop into both eyes 2 (two) times daily as needed for dry eyes. Natural Tears     No current facility-administered medications for this visit.     REVIEW OF SYSTEMS:  [X]  denotes positive finding, [ ]  denotes negative finding Cardiac  Comments:  Chest pain or chest pressure:    Shortness of breath upon exertion:    Short  of breath when lying flat:    Irregular heart rhythm:        Vascular    Pain in calf, thigh, or hip brought on by ambulation:    Pain in feet at night that wakes you  up from your sleep:     Blood clot in your veins:    Leg swelling:         Pulmonary    Oxygen at home: X   Productive cough:     Wheezing:         Neurologic    Sudden weakness in arms or legs:     Sudden numbness in arms or legs:     Sudden onset of difficulty speaking or slurred speech:    Temporary loss of vision in one eye:     Problems with dizziness:         Gastrointestinal    Blood in stool:     Vomited blood:         Genitourinary    Burning when urinating:     Blood in urine:        Psychiatric    Major depression:         Hematologic    Bleeding problems:    Problems with blood clotting too easily:        Skin    Rashes or ulcers:        Constitutional    Fever or chills:      PHYSICAL EXAM: Vitals:   05/04/16 0945 05/04/16 0949  BP: 127/62 133/64  Pulse: (!) 59   Resp: 18   SpO2: 97%   Weight: 182 lb (82.6 kg)   Height: 5' (1.524 m)     GENERAL: The patient is a well-nourished male, in no acute distress. The vital signs are documented above. CARDIAC: There is a regular rate and rhythm.  VASCULAR: I do not detect carotid bruits. PULMONARY: There is good air exchange bilaterally without wheezing or rales. ABDOMEN: Soft and non-tender with normal pitched bowel sounds.  MUSCULOSKELETAL: There are no major deformities or cyanosis. NEUROLOGIC: No focal weakness or paresthesias are detected. SKIN: There are no ulcers or rashes noted. PSYCHIATRIC: The patient has a normal affect.  DATA:   CAROTID DUPLEX: I have independently interpreted his carotid duplex scan today.  On the left side, the left carotid endarterectomy site is widely patent with a mild 40-59% recurrent carotid stenosis. This may be due to the tortuosity of the artery. On the right side there is a 40-59% stenosis.  The vertebral arteries are patent with antegrade flow.  MEDICAL ISSUES:  BILATERAL CAROTID DISEASE: The patient is status post combined CABG and left carotid endarterectomy in January 2017. He has some mild recurrent disease on the left in the 40-59% range although this may be due to vessel tortuosity. On the right side his carotid stenosis is stable. This is a 40-59% stenosis. I recommended a follow duplex scan in 6 months and I'll see him back that time. He is on aspirin and is on a statin. He is not a smoker.  [name] [facility] [phone]

## 2016-05-10 NOTE — Addendum Note (Signed)
Addended by: Kaleen Mask on: 05/10/2016 10:52 AM   Modules accepted: Orders

## 2016-05-12 ENCOUNTER — Encounter: Payer: Self-pay | Admitting: Cardiovascular Disease

## 2016-05-12 ENCOUNTER — Ambulatory Visit (INDEPENDENT_AMBULATORY_CARE_PROVIDER_SITE_OTHER): Payer: Medicare (Managed Care) | Admitting: Cardiovascular Disease

## 2016-05-12 VITALS — BP 138/60 | HR 60 | Ht 60.0 in | Wt 188.6 lb

## 2016-05-12 DIAGNOSIS — I10 Essential (primary) hypertension: Secondary | ICD-10-CM | POA: Diagnosis not present

## 2016-05-12 DIAGNOSIS — I2510 Atherosclerotic heart disease of native coronary artery without angina pectoris: Secondary | ICD-10-CM | POA: Diagnosis not present

## 2016-05-12 DIAGNOSIS — I5042 Chronic combined systolic (congestive) and diastolic (congestive) heart failure: Secondary | ICD-10-CM

## 2016-05-12 NOTE — Progress Notes (Signed)
Cardiology Office Note   Date:  05/12/2016   ID:  [name], DOB 1956-04-25, MRN [id number]  PCP:  [name], MD  Cardiologist:  [name]   Vascular Surgeon:  Dr. Scot Dock   Chief Complaint  Patient presents with  . Coronary Artery Disease     History of Present Illness: [name] is a 60 y.o. male with a hx of PAF, HTN, diabetes, HL, prior stroke, carotid stenosis, COPD, mental retardation. He was evaluated by Dr. Acie Fredrickson in 08/2012 for chest pain. Myoview was low risk. Echocardiogram in 3/14 demonstrated normal LV function with mild diastolic dysfunction. He has a history of diabetic ulcer and underwent right midfoot amputation in 04/2013.  Admitted 10/2014 with acute hypoxic respiratory failure secondary to a/c diastolic CHF, AECOPD and AF with RVR in the setting of sepsis syndrome related to left lower extremity cellulitis from diabetic foot ulcer.  He had elevated troponins possibly related to demand ischemia.  He converted to sinus rhythm with rate control therapy.   Given his multiple comorbid conditions, it was decided to treat him conservatively for his non-STEMI.  In follow-up, continued conservative management was recommended for his recent non-STEMI. However, he was seen Dr. Scot Dock with vascular surgery and noted to have progressive carotid artery stenosis on the left.  He needs carotid endarterectomy.  He was seen for surgical clearance and he did complain of occasional atypical sounding chest pain.  Myoview was arranged for risk stratification.  This demonstrated inferior, apical, inferolateral ischemia. Study was felt to be high risk.   LHC 04/30/15 demonstrated 3v CAD.  CABG has been recommended.  He returns for FU.  Since his LHC, he is doing well. He had one episode of chest pain that resolved quickly.  He denies significant changes in his dyspnea.  No orthopnea, PND. He has chronic LE edema without change.  No syncope.       Studies/Reports Reviewed Today:  LHC 04/30/15 LAD: Ostial and mid 95%, distal 40%, ostial D1 100%, ostial D2 85% RI: Ostial 65% LCx: Proximal to mid 70%, mid 65%, OM2 90% RCA: Ostial 80%, proximal to distal 100%, distal 100%, RPDA 85%, RPAVB 100% EF 35-45%     Myoview 04/23/15  No T wave inversion was noted during stress.  There was no ST segment deviation noted during stress.  Defect 1: There is a large defect of severe severity.  Findings consistent with ischemia.  This is a high risk study. Large, mostly reversible inferior, apical and inferolateral reversible perfusion defect suggestive of ischemia (SDS 10, Extent 37%). This study was not-gated due to atrial fibrillation. This is a high-risk study based on the size and severity of the perfusion defect.  Carotid US 8/16 R 60-79% L 80-99%  Echo 12/18/14 Mild LVH, EF 55%, wall motion abnormalities could not be excluded, MAC, trivial MR, mild LAE  Echo 10/16/12 Mild LVH, vigorous LVF, EF 65-70%, normal wall motion, grade 1 diastolic dysfunction  Nuclear study 09/06/12 Myoview scan with small apical defect Seen in horizontal images only. Cannot exclude tiny region of apical ischemia vs shift soft tissue. Otherwise normal perfusion. Overall low risk scan.  LV Ejection Fraction: 69%. LV Wall Motion: NL LV Function; NL Wall Motion  Dec. 7, 2016: [name] is seen today for follow up. Was turned down for CABG by Dr. Cyndia Bent   Oct. 5, 2017:  [name] is seen Was in the hospital in July with sepsis and + troponin .  Echo showed EF  of 45-50%  Overall dong well  Does not do much exercise.   Rides a stationary bike   Past Medical History:  Diagnosis Date  . Abdominal hernia    Chronic, not a good surgical candidate  . Abscess, abdomen ([place]) 12/31/2010   Referred to Wound Care in 01/2011 because of multiple abd abscess with VERY large ventral hernia (please look at image of CT abd/pelvis 09/2010).  Because of hernia I was  hesitant to I&D.      [name] Anemia    History of Iron Def Anemia  . Anxiety   . Carotid artery occlusion   . Chronic diastolic CHF (congestive heart failure) (HCC)    takes Lasix  . Chronic low back pain   . COPD (chronic obstructive pulmonary disease) ([place])   . CVA (cerebral infarction) 09/2010   Bilateral with Left > Right  . Diabetes mellitus   . GERD (gastroesophageal reflux disease)   . History of nuclear stress test    Myoview 9/16:  Inferior, apical and inf-lateral ischemia; not gated; High Risk  . Hx of echocardiogram    Echo 5/16:  Mild LVH, EF 55%, indeterm. diast function, WMA could not be ruled out, MAC, trivial MR, mild LAE, normal RVF //  b. Echo 4/17: EF 55-60%, no RWMA, Gr 2 DD, Ao sclerosis, MAC, mild MS, mild LAE, PASP 55 mHg  . Hyperlipidemia   . Hypertension   . Intellectual disability    Sister helps to take care of him and takes him to appts  . Itching    all over body; pt scratches and has sores on bilateral arms and abdomen  . Lung nodule   . Myocardial infarction 2016 ?   Heart attack  (  Per  pt. )  . Obesity   . Oxygen dependent    wears 2 liters at bedtime and when needed  . PAF (paroxysmal atrial fibrillation) ([place]) 06/2009   CHADS score 2 (HTN, DM), was not on coumadin, but now on Pradaxa for Afib  . Pneumonia    hx of  . Shortness of breath dyspnea    with exertion  . Stroke ([place])   . Tobacco user    Smokes 1ppd for multiple years.  Quit after hosp 09/2010.  . Wears glasses     Past Surgical History:  Procedure Laterality Date  . AMPUTATION Right 03/29/2013   Procedure: AMPUTATION RAY;  Surgeon: Newt Minion, MD;  Location: [place];  Service: Orthopedics;  Laterality: Right;  Right Foot 3rd and Possible 4th Ray Amputation  . AMPUTATION Right 04/23/2013   Procedure: AMPUTATION RIGHT MID-FOOT;  Surgeon: Newt Minion, MD;  Location: [place];  Service: Orthopedics;  Laterality: Right;  . arm surgery Left    as a child  . CARDIAC CATHETERIZATION N/A  04/30/2015   Procedure: Left Heart Cath and Coronary Angiography;  Surgeon: Belva Crome, MD;  Location: [place] [facility];  Service: Cardiovascular;  Laterality: N/A;  . Carotid arteriogram  10/2010   30% right ICA stenosis, 40% left ICA stenosis   . CAROTID ENDARTERECTOMY Left 08-13-15   CEA  . CATARACT EXTRACTION, BILATERAL    . COLONOSCOPY WITH PROPOFOL N/A 06/10/2014   Procedure: COLONOSCOPY WITH PROPOFOL;  Surgeon: Jerene Bears, MD;  Location: [facility];  Service: Gastroenterology;  Laterality: N/A;  . CORONARY ARTERY BYPASS GRAFT N/A 08/13/2015   Procedure: CORONARY ARTERY BYPASS GRAFTING (CABG), ON PUMP, TIMES THREE, USING LEFT INTERNAL MAMMARY ARTERY, RIGHT GREATER  SAPHENOUS VEIN HARVESTED ENDOSCOPICALLY;  Surgeon: [name], MD;  Location: [facility];  Service: Open Heart Surgery;  Laterality: N/A;  . DEBRIDMENT OF DECUBITUS ULCER Right 02/13/2013  . ENDARTERECTOMY Left 08/13/2015   Procedure: ENDARTERECTOMY CAROTID;  Surgeon: Angelia Mould, MD;  Location: [place];  Service: Vascular;  Laterality: Left;  . ESOPHAGOGASTRODUODENOSCOPY (EGD) WITH PROPOFOL N/A 06/10/2014   Procedure: ESOPHAGOGASTRODUODENOSCOPY (EGD) WITH PROPOFOL;  Surgeon: Jerene Bears, MD;  Location: [facility];  Service: Gastroenterology;  Laterality: N/A;  . GIVENS CAPSULE STUDY N/A 07/09/2014   Procedure: GIVENS CAPSULE STUDY;  Surgeon: Jerene Bears, MD;  Location: [facility];  Service: Gastroenterology;  Laterality: N/A;  . I&D EXTREMITY Right 02/13/2013   Procedure: IRRIGATION AND DEBRIDEMENT FOOT ULCER;  Surgeon: Johnny Bridge, MD;  Location: [place];  Service: Orthopedics;  Laterality: Right;  PULSE LAVAGE  . MULTIPLE TOOTH EXTRACTIONS    . TEE WITHOUT CARDIOVERSION N/A 08/13/2015   Procedure: TRANSESOPHAGEAL ECHOCARDIOGRAM (TEE);  Surgeon: [name], MD;  Location: [place];  Service: Open Heart Surgery;  Laterality: N/A;  . TRANSESOPHAGEAL ECHOCARDIOGRAM  09/2010   No ASD or PFO. EF 60-65%.  Normal systolic  function. No evidence of thrombus.   . TRANSTHORACIC ECHOCARDIOGRAM  09/2010    The cavity size was normal. Systolic function was vigorous.  EF 65-70%.  Normal wall funciton.      Current Outpatient Prescriptions  Medication Sig Dispense Refill  . acetaminophen (TYLENOL) 325 MG tablet Take 650 mg by mouth 3 (three) times daily as needed for mild pain.    [name] amLODipine (NORVASC) 5 MG tablet Take 5 mg by mouth daily.    [name] aspirin EC 81 MG EC tablet Take 1 tablet (81 mg total) by mouth daily. 90 tablet 3  . atorvastatin (LIPITOR) 80 MG tablet Take 80 mg by mouth daily.    . Cholecalciferol (VITAMIN D3) 1000 UNITS CAPS Take 1,000 Units by mouth daily.    . clonazePAM (KLONOPIN) 0.5 MG tablet Take 1 tablet (0.5 mg total) by mouth at bedtime. 30 tablet 0  . dabigatran (PRADAXA) 150 MG CAPS capsule Take 150 mg by mouth 2 (two) times daily.    . ferrous gluconate (FERGON) 324 MG tablet Take 1 tablet (324 mg total) by mouth daily with breakfast. 60 tablet 0  . fludrocortisone (FLORINEF) 0.1 MG tablet Take 0.1 mg by mouth daily.    . furosemide (LASIX) 40 MG tablet Take 40 mg by mouth daily.    [name] gabapentin (NEURONTIN) 600 MG tablet Take 600 mg by mouth 2 (two) times daily.    [name] HYDROcodone-acetaminophen (NORCO) 5-325 MG tablet Take 1 tablet by mouth daily.    . hydrocortisone (ANUSOL-HC) 2.5 % rectal cream Place 1 application rectally 3 (three) times daily as needed for hemorrhoids or itching.    . Insulin Glargine (LANTUS) 100 UNIT/ML Solostar Pen Inject 10 Units into the skin every evening.    . metFORMIN (GLUCOPHAGE) 1000 MG tablet Take 1,000 mg by mouth 2 (two) times daily with a meal.    . metoprolol tartrate (LOPRESSOR) 25 MG tablet Take 0.5 tablets (12.5 mg total) by mouth 2 (two) times daily. 60 tablet 0  . omeprazole (PRILOSEC) 20 MG capsule Take 20 mg by mouth daily.    [name] PARoxetine (PAXIL) 40 MG tablet Take 40 mg by mouth daily.     . polyvinyl alcohol (LIQUIFILM TEARS) 1.4 % ophthalmic  solution Place 1 drop into both eyes 2 (two) times  daily as needed for dry eyes. Natural Tears     No current facility-administered medications for this visit.     Allergies:   Penicillins    Social History:  The patient  reports that he quit smoking about 5 years ago. His smoking use included Cigarettes and Pipe. He has a 42.00 pack-year smoking history. He quit smokeless tobacco use about 5 years ago. He reports that he does not drink alcohol or use drugs.   Family History:  The patient's family history includes Breast cancer in his maternal grandmother; Heart attack in his maternal grandmother; Heart disease in his brother, father, maternal grandmother, and mother; Hypertension in his sister; Peripheral vascular disease in his father; Stomach cancer in his maternal uncle.    ROS:   Please see the history of present illness.   Review of Systems  All other systems reviewed and are negative.    PHYSICAL EXAM: VS:  BP 138/60 (BP Location: Left Arm, Patient Position: Sitting, Cuff Size: Normal)   Pulse 60   Ht 5' (1.524 m)   Wt 188 lb 9.6 oz (85.5 kg)   BMI 36.83 kg/m     Wt Readings from Last 3 Encounters:  05/12/16 188 lb 9.6 oz (85.5 kg)  05/04/16 182 lb (82.6 kg)  04/06/16 185 lb (83.9 kg)     GEN: WN/WD male, in no acute distress  HEENT: normal  Neck: I cannot assess JVD, no masses Cardiac:  Normal S1/S2, RRR; no murmur ,  no rubs or gallops, trace bilateral LE edema Respiratory:  Decreased breath sounds bilaterally, no wheezing. GI: Large ventral hernia noted, soft, + BS MS: no deformity or atrophy  Skin: warm and dry  Neuro:  CNs II-XII intact, Strength and sensation are intact Psych: Normal affect   EKG:  EKG is no ordered today.  It demonstrates:   n/a   Recent Labs: 05/19/2015: B Natriuretic Peptide 50.5 08/14/2015: Magnesium 2.2 02/13/2016: ALT 14 02/19/2016: BUN 14; Creatinine, Ser 0.90; Hemoglobin 9.9; Platelets 217; Potassium 4.0; Sodium 138    Lipid  Panel    Component Value Date/Time   CHOL 99 (L) 07/14/2015 1149   TRIG 156 (H) 07/14/2015 1149   HDL 21 (L) 07/14/2015 1149   CHOLHDL 4.7 07/14/2015 1149   VLDL 31 (H) 07/14/2015 1149   LDLCALC 47 07/14/2015 1149   LDLDIRECT 54 08/23/2012 1447     ASSESSMENT AND PLAN:  1. CAD:    History of non-STEMI in 10/2014 while hospitalized for acute, multiple, medical illnesses.  Recent high risk Myoview with a large area of inferior ischemia.  [place] 9/22 demonstrated severe 3v CAD and reduced EF 35-45%.   He is s/p CABG   Will have him see Richardson Dopp in 6 months and return to see me again in 1 year.   .  Continue ASA, Lipitor, beta-blocker, nitrates.  2. Chronic combined systolic and diastolic CHF (congestive heart failure):  Volume stable.  Continue current regimen.  He was tried on Losartan but developed hyperkalemia     3. Paroxysmal atrial fibrillation:  Maintaining NSR. He remains on Pradaxa for anticoagulation. He has a hx of CVA.    Question if Maze will be considered at time of surgery.    4. Essential hypertension:  Controlled.  5. HYPERCHOLESTEROLEMIA:  Continue statin.    6. Carotid stenosis, bilateral:  L CEA pending.     Will see Richardson Dopp in 6 months  Will see me in 1 year    Arnette Norris  Cherl Gorney, MD  05/12/2016 9:14 AM    [facility] [address] [place],  [place], [place]  [address] Pager [phone] Phone: [phone]; Fax: [phone]

## 2016-05-12 NOTE — Patient Instructions (Signed)
Medication Instructions:  Your physician recommends that you continue on your current medications as directed. Please refer to the Current Medication list given to you today.   Labwork: None Ordered   Testing/Procedures: None Ordered   Follow-Up: Your physician wants you to follow-up in: 6 months with Richardson Dopp, PA.  You will receive a reminder letter in the mail two months in advance. If you don't receive a letter, please call our office to schedule the follow-up appointment.  Your physician wants you to follow-up in: 1 year with Dr. Acie Fredrickson.  You will receive a reminder letter in the mail two months in advance. If you don't receive a letter, please call our office to schedule the follow-up appointment.   If you need a refill on your cardiac medications before your next appointment, please call your pharmacy.   Thank you for choosing [facility]! Christen Bame, RN [phone]

## 2016-05-31 ENCOUNTER — Other Ambulatory Visit: Payer: Self-pay | Admitting: Family Medicine

## 2016-05-31 DIAGNOSIS — K219 Gastro-esophageal reflux disease without esophagitis: Secondary | ICD-10-CM

## 2016-06-02 ENCOUNTER — Other Ambulatory Visit: Payer: Self-pay | Admitting: Family Medicine

## 2016-06-02 ENCOUNTER — Ambulatory Visit: Payer: Medicare (Managed Care) | Admitting: Physician Assistant

## 2016-06-02 DIAGNOSIS — R1013 Epigastric pain: Secondary | ICD-10-CM

## 2016-06-02 DIAGNOSIS — K219 Gastro-esophageal reflux disease without esophagitis: Secondary | ICD-10-CM

## 2016-06-03 ENCOUNTER — Ambulatory Visit: Payer: Medicare (Managed Care) | Admitting: Physician Assistant

## 2016-06-09 ENCOUNTER — Ambulatory Visit: Payer: Medicare (Managed Care) | Admitting: Cardiovascular Disease

## 2016-06-09 ENCOUNTER — Other Ambulatory Visit: Payer: Medicare (Managed Care)

## 2016-07-28 IMAGING — CR DG CHEST 2V
2 series · 2 of 2 positions shown · non-contrast
Comparison: Radiograph 08/17/2015

CLINICAL DATA: CABG 08/13/2015

EXAM:
CHEST  2 VIEW

[w chest lat]
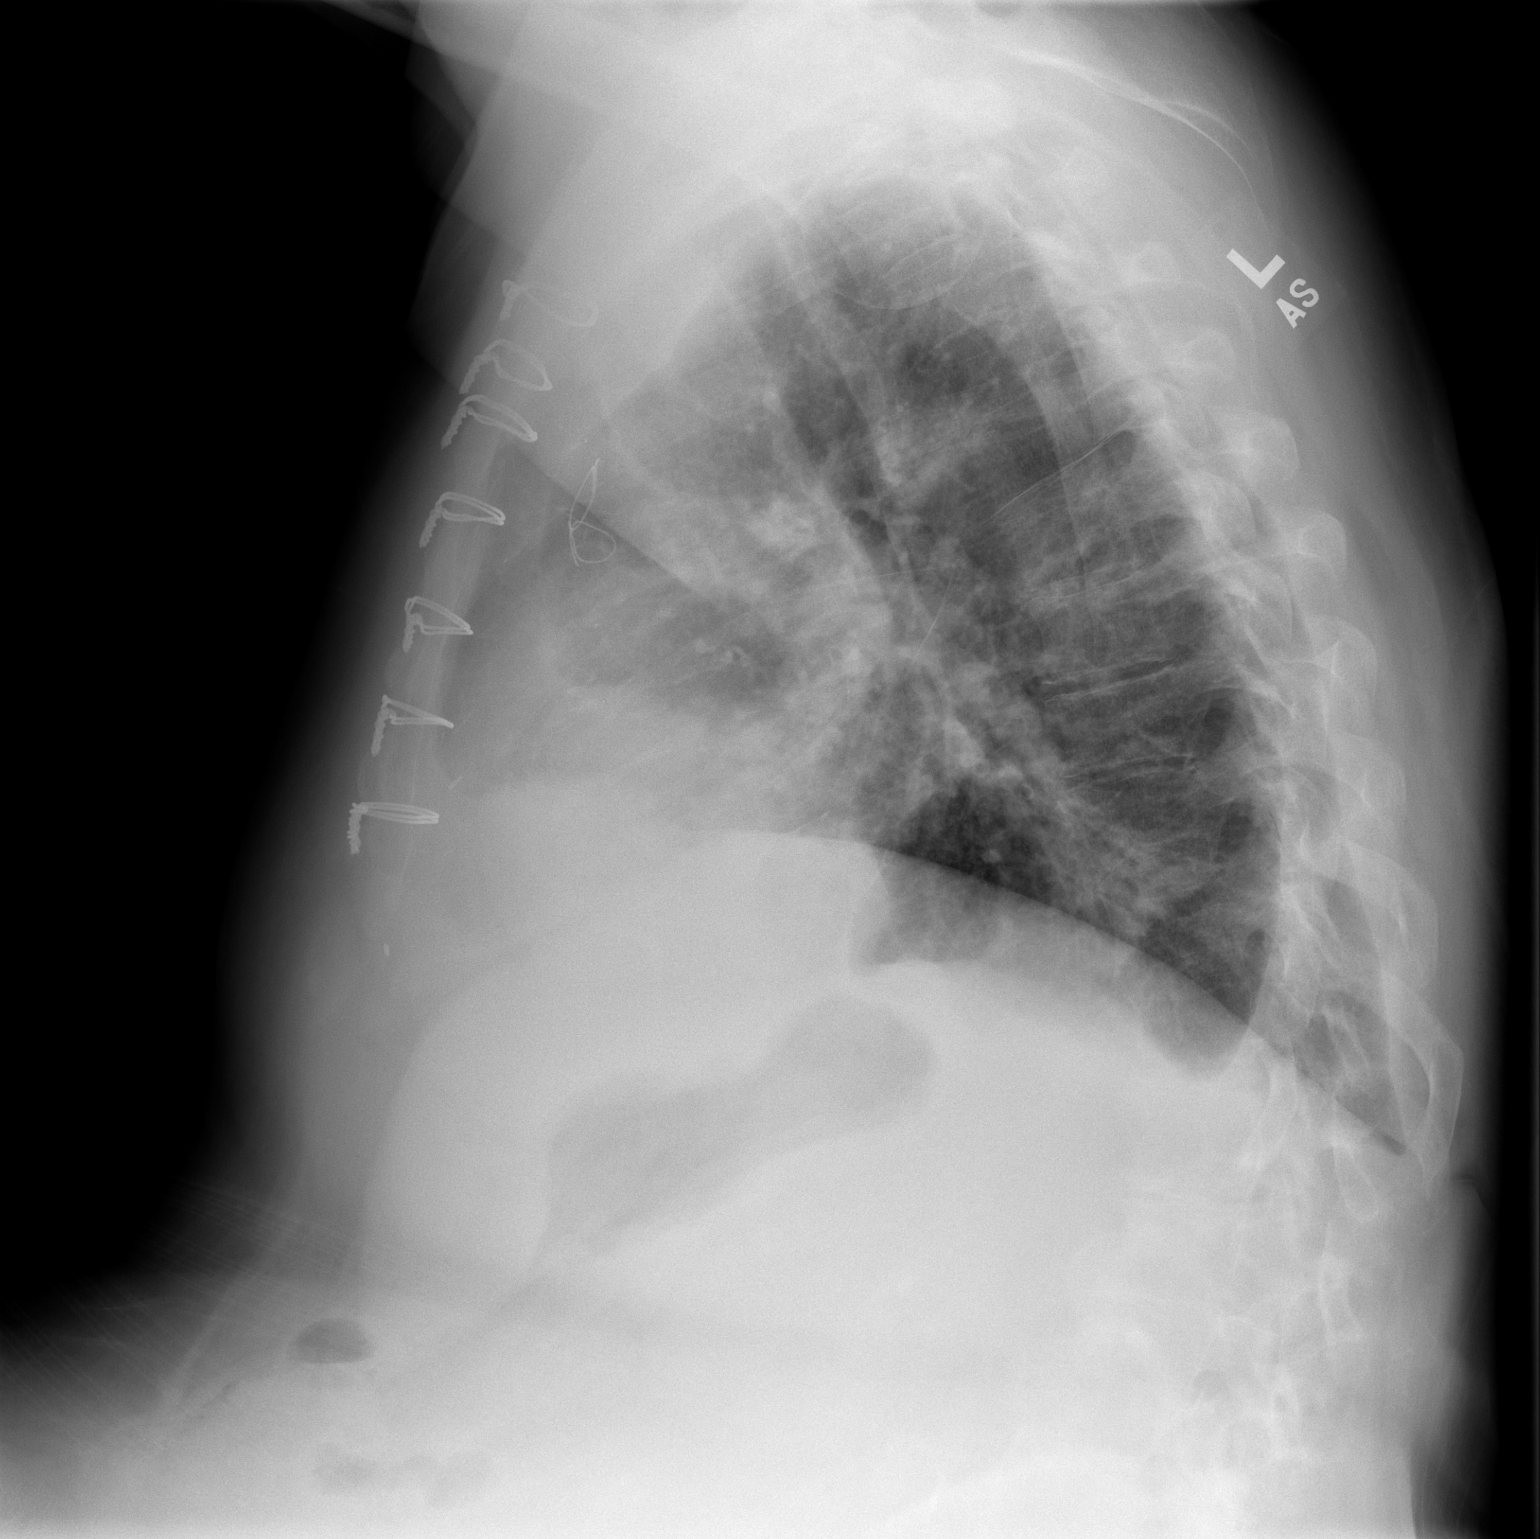

[w chest ap]
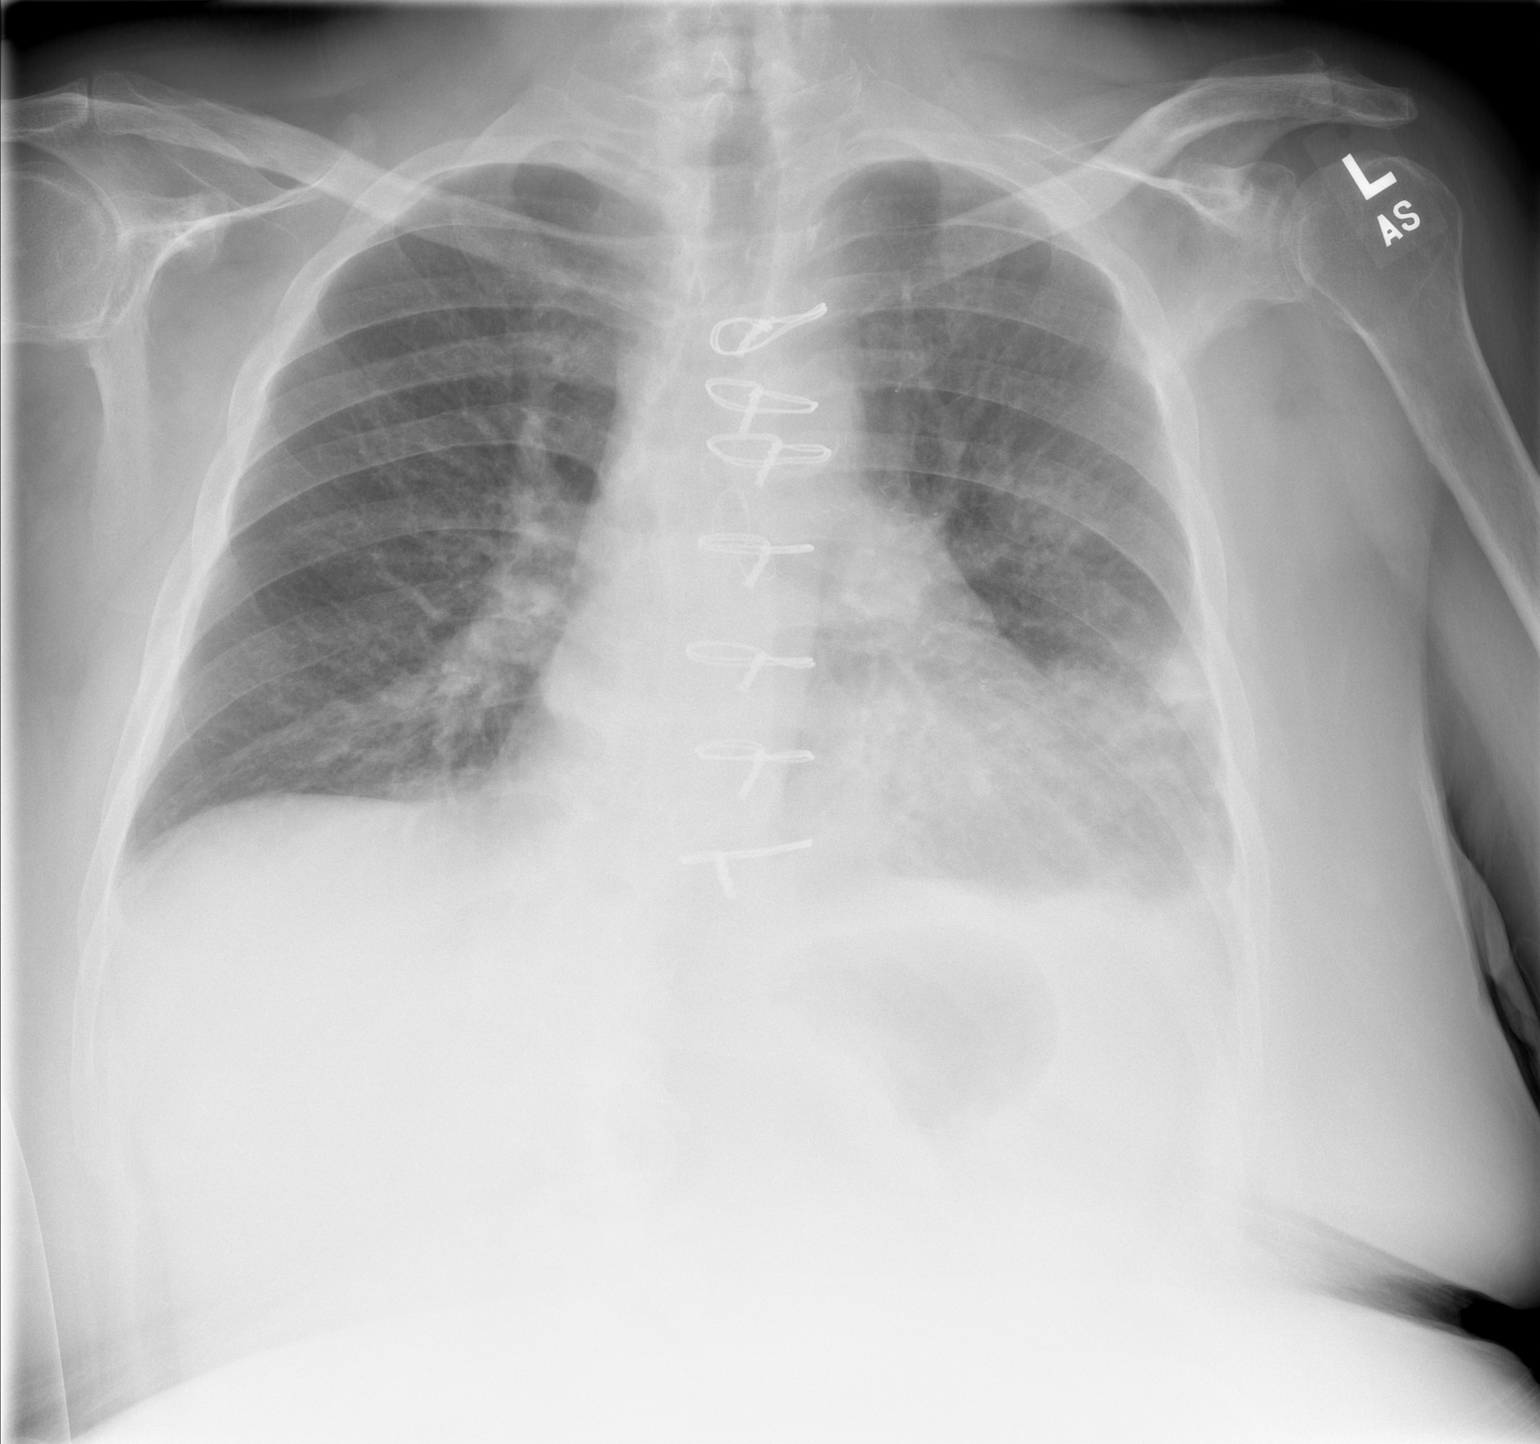

[2 of 2 positions shown; findings below may reference images not displayed]

FINDINGS: Sternotomy wires overlie normal cardiac silhouette. There is new
patchy airspace disease in the LEFT lower lobe and lingula. RIGHT
lung is clear. Improved RIGHT lower lobe atelectasis and effusion.
IMPRESSION: LEFT lower lobe and lingular PNEUMONIA.

These results will be called to the ordering clinician or
representative by the Radiologist Assistant, and communication
documented in the PACS or zVision Dashboard.

## 2016-08-10 ENCOUNTER — Other Ambulatory Visit: Payer: Medicare (Managed Care)

## 2016-08-11 ENCOUNTER — Emergency Department (HOSPITAL_COMMUNITY): Payer: Medicare (Managed Care)

## 2016-08-11 ENCOUNTER — Inpatient Hospital Stay (HOSPITAL_COMMUNITY)
Admission: EM | Admit: 2016-08-11 | Discharge: 2016-08-14 | DRG: 292 | Disposition: A | Discharge status: Home / Self Care | Payer: Medicare (Managed Care) | Attending: Oncology | Admitting: Oncology

## 2016-08-11 ENCOUNTER — Encounter (HOSPITAL_COMMUNITY): Payer: Self-pay | Admitting: *Deleted

## 2016-08-11 DIAGNOSIS — I480 Paroxysmal atrial fibrillation: Secondary | ICD-10-CM | POA: Diagnosis present

## 2016-08-11 DIAGNOSIS — F329 Major depressive disorder, single episode, unspecified: Secondary | ICD-10-CM | POA: Diagnosis present

## 2016-08-11 DIAGNOSIS — J9611 Chronic respiratory failure with hypoxia: Secondary | ICD-10-CM | POA: Diagnosis present

## 2016-08-11 DIAGNOSIS — I5043 Acute on chronic combined systolic (congestive) and diastolic (congestive) heart failure: Secondary | ICD-10-CM | POA: Diagnosis present

## 2016-08-11 DIAGNOSIS — Z880 Allergy status to penicillin: Secondary | ICD-10-CM

## 2016-08-11 DIAGNOSIS — D62 Acute posthemorrhagic anemia: Secondary | ICD-10-CM | POA: Diagnosis not present

## 2016-08-11 DIAGNOSIS — F1010 Alcohol abuse, uncomplicated: Secondary | ICD-10-CM | POA: Diagnosis present

## 2016-08-11 DIAGNOSIS — I4510 Unspecified right bundle-branch block: Secondary | ICD-10-CM

## 2016-08-11 DIAGNOSIS — I255 Ischemic cardiomyopathy: Secondary | ICD-10-CM | POA: Diagnosis present

## 2016-08-11 DIAGNOSIS — Z794 Long term (current) use of insulin: Secondary | ICD-10-CM | POA: Diagnosis not present

## 2016-08-11 DIAGNOSIS — E785 Hyperlipidemia, unspecified: Secondary | ICD-10-CM | POA: Diagnosis present

## 2016-08-11 DIAGNOSIS — I509 Heart failure, unspecified: Secondary | ICD-10-CM

## 2016-08-11 DIAGNOSIS — I2510 Atherosclerotic heart disease of native coronary artery without angina pectoris: Secondary | ICD-10-CM | POA: Diagnosis present

## 2016-08-11 DIAGNOSIS — Z9981 Dependence on supplemental oxygen: Secondary | ICD-10-CM

## 2016-08-11 DIAGNOSIS — L304 Erythema intertrigo: Secondary | ICD-10-CM | POA: Diagnosis present

## 2016-08-11 DIAGNOSIS — E11621 Type 2 diabetes mellitus with foot ulcer: Secondary | ICD-10-CM | POA: Diagnosis present

## 2016-08-11 DIAGNOSIS — Z7982 Long term (current) use of aspirin: Secondary | ICD-10-CM | POA: Diagnosis not present

## 2016-08-11 DIAGNOSIS — I110 Hypertensive heart disease with heart failure: Secondary | ICD-10-CM | POA: Diagnosis present

## 2016-08-11 DIAGNOSIS — E2740 Unspecified adrenocortical insufficiency: Secondary | ICD-10-CM | POA: Diagnosis present

## 2016-08-11 DIAGNOSIS — F419 Anxiety disorder, unspecified: Secondary | ICD-10-CM | POA: Diagnosis present

## 2016-08-11 DIAGNOSIS — Q394 Esophageal web: Secondary | ICD-10-CM | POA: Diagnosis not present

## 2016-08-11 DIAGNOSIS — Z951 Presence of aortocoronary bypass graft: Secondary | ICD-10-CM | POA: Diagnosis not present

## 2016-08-11 DIAGNOSIS — Z8673 Personal history of transient ischemic attack (TIA), and cerebral infarction without residual deficits: Secondary | ICD-10-CM | POA: Diagnosis not present

## 2016-08-11 DIAGNOSIS — D649 Anemia, unspecified: Secondary | ICD-10-CM | POA: Diagnosis not present

## 2016-08-11 DIAGNOSIS — R1310 Dysphagia, unspecified: Secondary | ICD-10-CM

## 2016-08-11 DIAGNOSIS — K469 Unspecified abdominal hernia without obstruction or gangrene: Secondary | ICD-10-CM | POA: Diagnosis not present

## 2016-08-11 DIAGNOSIS — B372 Candidiasis of skin and nail: Secondary | ICD-10-CM | POA: Diagnosis present

## 2016-08-11 DIAGNOSIS — R1314 Dysphagia, pharyngoesophageal phase: Secondary | ICD-10-CM | POA: Diagnosis present

## 2016-08-11 DIAGNOSIS — K228 Other specified diseases of esophagus: Secondary | ICD-10-CM | POA: Diagnosis not present

## 2016-08-11 DIAGNOSIS — Z9114 Patient's other noncompliance with medication regimen: Secondary | ICD-10-CM

## 2016-08-11 LAB — CBC WITH DIFFERENTIAL/PLATELET
BASOS PCT: 0 %
Basophils Absolute: 0 10*3/uL (ref 0.0–0.1)
EOS ABS: 0.3 10*3/uL (ref 0.0–0.7)
Eosinophils Relative: 4 %
HEMATOCRIT: 26.5 % — AB (ref 39.0–52.0)
HEMOGLOBIN: 8.1 g/dL — AB (ref 13.0–17.0)
Lymphocytes Relative: 19 %
Lymphs Abs: 1.5 10*3/uL (ref 0.7–4.0)
MCH: 24.5 pg — ABNORMAL LOW (ref 26.0–34.0)
MCHC: 30.6 g/dL (ref 30.0–36.0)
MCV: 80.1 fL (ref 78.0–100.0)
Monocytes Absolute: 0.4 10*3/uL (ref 0.1–1.0)
Monocytes Relative: 5 %
NEUTROS ABS: 5.7 10*3/uL (ref 1.7–7.7)
NEUTROS PCT: 72 %
Platelets: 301 10*3/uL (ref 150–400)
RBC: 3.31 MIL/uL — AB (ref 4.22–5.81)
RDW: 15.7 % — ABNORMAL HIGH (ref 11.5–15.5)
WBC: 7.9 10*3/uL (ref 4.0–10.5)

## 2016-08-11 LAB — URINALYSIS, ROUTINE W REFLEX MICROSCOPIC
Bacteria, UA: NONE SEEN
Bilirubin Urine: NEGATIVE
GLUCOSE, UA: NEGATIVE mg/dL
Ketones, ur: NEGATIVE mg/dL
LEUKOCYTES UA: NEGATIVE
Nitrite: NEGATIVE
PH: 5 (ref 5.0–8.0)
PROTEIN: 100 mg/dL — AB
Specific Gravity, Urine: 1.011 (ref 1.005–1.030)
Squamous Epithelial / LPF: NONE SEEN

## 2016-08-11 LAB — COMPREHENSIVE METABOLIC PANEL
ALBUMIN: 2.5 g/dL — AB (ref 3.5–5.0)
ALT: 12 U/L — ABNORMAL LOW (ref 17–63)
AST: 15 U/L (ref 15–41)
Alkaline Phosphatase: 47 U/L (ref 38–126)
Anion gap: 8 (ref 5–15)
BILIRUBIN TOTAL: 0.4 mg/dL (ref 0.3–1.2)
BUN: 31 mg/dL — ABNORMAL HIGH (ref 6–20)
CHLORIDE: 104 mmol/L (ref 101–111)
CO2: 27 mmol/L (ref 22–32)
CREATININE: 1 mg/dL (ref 0.61–1.24)
Calcium: 8.8 mg/dL — ABNORMAL LOW (ref 8.9–10.3)
Glucose, Bld: 153 mg/dL — ABNORMAL HIGH (ref 65–99)
POTASSIUM: 4.1 mmol/L (ref 3.5–5.1)
SODIUM: 139 mmol/L (ref 135–145)
TOTAL PROTEIN: 5.8 g/dL — AB (ref 6.5–8.1)

## 2016-08-11 LAB — GLUCOSE, CAPILLARY: GLUCOSE-CAPILLARY: 118 mg/dL — AB (ref 65–99)

## 2016-08-11 LAB — I-STAT TROPONIN, ED: Troponin i, poc: 0.02 ng/mL (ref 0.00–0.08)

## 2016-08-11 LAB — LIPASE, BLOOD: LIPASE: 14 U/L (ref 11–51)

## 2016-08-11 LAB — BRAIN NATRIURETIC PEPTIDE: B NATRIURETIC PEPTIDE 5: 476.4 pg/mL — AB (ref 0.0–100.0)

## 2016-08-11 MED ORDER — FUROSEMIDE 10 MG/ML IJ SOLN
40.0000 mg | Freq: Once | INTRAMUSCULAR | Status: AC
  Administered 2016-08-11: 40 mg via INTRAVENOUS
  Filled 2016-08-11: qty 4

## 2016-08-11 MED ORDER — ONDANSETRON HCL 4 MG/2ML IJ SOLN
4.0000 mg | Freq: Once | INTRAMUSCULAR | Status: AC
  Administered 2016-08-11: 4 mg via INTRAVENOUS
  Filled 2016-08-11: qty 2

## 2016-08-11 NOTE — ED Notes (Signed)
Attempted report x1. 

## 2016-08-11 NOTE — ED Notes (Signed)
In conversation, learned that pt had a pocket knife in his possession. Pt was informed that pocket knife had to be locked up with Security. A Patient Valuables Envelope [id number]) was filled out and signed by the pt. The envelope was then given to Security. Pt given yellow copy and plastic top sheet (No [id number]). Pt informed that he needs to retrieve his pocket knife from Security in the ED.

## 2016-08-11 NOTE — ED Provider Notes (Signed)
[facility] DEPT Provider Note   CSN: [id number] Arrival date & time: 08/11/16  1705     History   Chief Complaint Chief Complaint  Patient presents with  . hypoxia    HPI ARLYNN MCDERMID is a 61 y.o. male.  HPI  61 year old male who presents with nausea, vomiting, and diarrhea. He has a history of COPD with chronic respiratory failure on 2 L home oxygen PRN, prior CVA, CAD s/p CABG, proximal atrial fibrillation on pradaxa, diabetes, chronic tobacco use, systolic and diastolic heart failure EF 45-50%. Has had multiple episodes of profuse diarrhea starting yesterday. Associating with vomiting, where he feels food stuck at the end of the stomach/esophagus causing him to vomit. Vomiting associated with pain at the lower part of the chest and upper abdomen..  C/o abdominal fullness but no abdominal pain currently. No fever or chills.   Has had mild cough and shortness of breath. Increased sob recently where he needs to wear PRN oxygen more. Thinks weight gain but no LE edema, just increased abdominal girth. Has needed to sleep upright over past few days which is new. Has associating chest tightness. Called EMS today, and was hypoxic on room air. Denies urinary complaints.  Past Medical History:  Diagnosis Date  . Abdominal hernia    Chronic, not a good surgical candidate  . Abscess, abdomen ([place]) 12/31/2010   Referred to Wound Care in 01/2011 because of multiple abd abscess with VERY large ventral hernia (please look at image of CT abd/pelvis 09/2010).  Because of hernia I was hesitant to I&D.      Marland Kitchen Anemia    History of Iron Def Anemia  . Anxiety   . Carotid artery occlusion   . Chronic diastolic CHF (congestive heart failure) (HCC)    takes Lasix  . Chronic low back pain   . COPD (chronic obstructive pulmonary disease) ([place])   . CVA (cerebral infarction) 09/2010   Bilateral with Left > Right  . Diabetes mellitus   . GERD (gastroesophageal reflux disease)   . History of nuclear  stress test    Myoview 9/16:  Inferior, apical and inf-lateral ischemia; not gated; High Risk  . Hx of echocardiogram    Echo 5/16:  Mild LVH, EF 55%, indeterm. diast function, WMA could not be ruled out, MAC, trivial MR, mild LAE, normal RVF //  b. Echo 4/17: EF 55-60%, no RWMA, Gr 2 DD, Ao sclerosis, MAC, mild MS, mild LAE, PASP 55 mHg  . Hyperlipidemia   . Hypertension   . Intellectual disability    Sister helps to take care of him and takes him to appts  . Itching    all over body; pt scratches and has sores on bilateral arms and abdomen  . Lung nodule   . Myocardial infarction 2016 ?   Heart attack  (  Per  pt. )  . Obesity   . Oxygen dependent    wears 2 liters at bedtime and when needed  . PAF (paroxysmal atrial fibrillation) ([place]) 06/2009   CHADS score 2 (HTN, DM), was not on coumadin, but now on Pradaxa for Afib  . Pneumonia    hx of  . Shortness of breath dyspnea    with exertion  . Stroke ([place])   . Tobacco user    Smokes 1ppd for multiple years.  Quit after hosp 09/2010.  . Wears glasses     Patient Active Problem List   Diagnosis Date Noted  . Acute  exacerbation of CHF (congestive heart failure) ([place]) 08/11/2016  . CAP (community acquired pneumonia) 02/14/2016  . Nausea and vomiting 02/14/2016  . Diabetes mellitus type 2 in obese ([place]) 02/14/2016  . Elevated troponin 02/14/2016  . Sepsis ([place]) 02/13/2016  . Ischemic cardiomyopathy 09/17/2015  . PAF (paroxysmal atrial fibrillation) ([place]) 09/17/2015  . CAD (coronary artery disease) 07/14/2015  . Chronic combined systolic and diastolic CHF (congestive heart failure) ([place]) 07/14/2015  . Abrasion of abdominal wall 10/26/2014  . Abdominal hernia 10/26/2014  . AKI (acute kidney injury) ([place])   . Gastritis and gastroduodenitis 06/10/2014  . Benign neoplasm of ascending colon 06/10/2014  . 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 ([place]) 02/14/2013  . Wound, open, foot 12/13/2012  . IDA (iron deficiency  anemia) 12/13/2012  . Hyperkalemia 05/22/2012  . Diabetic leg ulcer ([place]) 05/15/2012  . Routine health maintenance 05/15/2012  . Leg weakness, bilateral 12/10/2011  . Carotid stenosis 08/31/2011  . Sciatica 05/22/2011  . Ventral hernia 01/24/2011  . Incidental lung nodule, greater than or equal to 74m 11/24/2010  . CVA (cerebral vascular accident) ([place] 10/24/2010  . Anxiety state 06/22/2007  . DM (diabetes mellitus), type 2, uncontrolled ([place] 12/22/2006  . Essential hypertension 12/22/2006  . HYPERCHOLESTEROLEMIA 10/05/2006  . TOBACCO DEPENDENCE 10/05/2006  . Unspecified intellectual disabilities 10/05/2006  . COPD 10/05/2006    Past Surgical History:  Procedure Laterality Date  . AMPUTATION Right 03/29/2013   Procedure: AMPUTATION RAY;  Surgeon: MNewt Minion MD;  [facility]  Service: Orthopedics;  Laterality: Right;  Right Foot 3rd and Possible 4th Ray Amputation  . AMPUTATION Right 04/23/2013   Procedure: AMPUTATION RIGHT MID-FOOT;  Surgeon: MNewt Minion MD;  [facility]  Service: Orthopedics;  Laterality: Right;  . arm surgery Left    as a child  . CARDIAC CATHETERIZATION N/A 04/30/2015   Procedure: Left Heart Cath and Coronary Angiography;  Surgeon: HBelva Crome MD;  [facility];  Service: Cardiovascular;  Laterality: N/A;  . Carotid arteriogram  10/2010   30% right ICA stenosis, 40% left ICA stenosis   . CAROTID ENDARTERECTOMY Left 08-13-15   CEA  . CATARACT EXTRACTION, BILATERAL    . COLONOSCOPY WITH PROPOFOL N/A 06/10/2014   Procedure: COLONOSCOPY WITH PROPOFOL;  Surgeon: JJerene Bears MD;  [facility];  Service: Gastroenterology;  Laterality: N/A;  . CORONARY ARTERY BYPASS GRAFT N/A 08/13/2015   Procedure: CORONARY ARTERY BYPASS GRAFTING (CABG), ON PUMP, TIMES THREE, USING LEFT INTERNAL MAMMARY ARTERY, RIGHT GREATER SAPHENOUS VEIN HARVESTED ENDOSCOPICALLY;  Surgeon: BGaye Pollack MD;  [facility]  Service: Open Heart Surgery;   Laterality: N/A;  . DEBRIDMENT OF DECUBITUS ULCER Right 02/13/2013  . ENDARTERECTOMY Left 08/13/2015   Procedure: ENDARTERECTOMY CAROTID;  Surgeon: CAngelia Mould MD;  [facility]  Service: Vascular;  Laterality: Left;  . ESOPHAGOGASTRODUODENOSCOPY (EGD) WITH PROPOFOL N/A 06/10/2014   Procedure: ESOPHAGOGASTRODUODENOSCOPY (EGD) WITH PROPOFOL;  Surgeon: JJerene Bears MD;  [facility];  Service: Gastroenterology;  Laterality: N/A;  . GIVENS CAPSULE STUDY N/A 07/09/2014   Procedure: GIVENS CAPSULE STUDY;  Surgeon: JJerene Bears MD;  [facility];  Service: Gastroenterology;  Laterality: N/A;  . I&D EXTREMITY Right 02/13/2013   Procedure: IRRIGATION AND DEBRIDEMENT FOOT ULCER;  Surgeon: JJohnny Bridge MD;  [facility]  Service: Orthopedics;  Laterality: Right;  PULSE LAVAGE  . MULTIPLE TOOTH EXTRACTIONS    . TEE WITHOUT CARDIOVERSION N/A 08/13/2015  Procedure: TRANSESOPHAGEAL ECHOCARDIOGRAM (TEE);  Surgeon: [name], MD;  Location: [facility];  Service: Open Heart Surgery;  Laterality: N/A;  . TRANSESOPHAGEAL ECHOCARDIOGRAM  09/2010   No ASD or PFO. EF 60-65%.  Normal systolic function. No evidence of thrombus.   . TRANSTHORACIC ECHOCARDIOGRAM  09/2010    The cavity size was normal. Systolic function was vigorous.  EF 65-70%.  Normal wall funciton.        Home Medications    Prior to Admission medications   Medication Sig Start Date End Date Taking? Authorizing Provider  acetaminophen (TYLENOL) 325 MG tablet Take 650 mg by mouth 3 (three) times daily as needed for mild pain.   Yes Historical Provider, MD  amLODipine (NORVASC) 5 MG tablet Take 5 mg by mouth daily.   Yes Historical Provider, MD  aspirin EC 81 MG EC tablet Take 1 tablet (81 mg total) by mouth daily. 11/11/12  Yes Kandis Nab, MD  atorvastatin (LIPITOR) 80 MG tablet Take 80 mg by mouth daily.   Yes Historical Provider, MD  calcium carbonate (TUMS - DOSED IN MG ELEMENTAL CALCIUM) 500 MG  chewable tablet Chew 1 tablet by mouth 3 (three) times daily as needed for indigestion or heartburn.   Yes Historical Provider, MD  Cholecalciferol (VITAMIN D3) 1000 UNITS CAPS Take 1,000 Units by mouth daily.   Yes Historical Provider, MD  clonazePAM (KLONOPIN) 0.5 MG tablet Take 1 tablet (0.5 mg total) by mouth at bedtime. 11/03/14  Yes Janece Canterbury, MD  dabigatran (PRADAXA) 150 MG CAPS capsule Take 150 mg by mouth 2 (two) times daily.   Yes Historical Provider, MD  ferrous gluconate (FERGON) 324 MG tablet Take 1 tablet (324 mg total) by mouth daily with breakfast. 08/21/15  Yes Donielle Liston Alba, PA-C  fludrocortisone (FLORINEF) 0.1 MG tablet Take 0.1 mg by mouth daily.   Yes Historical Provider, MD  furosemide (LASIX) 40 MG tablet Take 40 mg by mouth daily.   Yes Historical Provider, MD  gabapentin (NEURONTIN) 600 MG tablet Take 600 mg by mouth 2 (two) times daily.   Yes Historical Provider, MD  HYDROcodone-acetaminophen (NORCO) 5-325 MG tablet Take 1 tablet by mouth daily.   Yes Historical Provider, MD  hydrocortisone (ANUSOL-HC) 2.5 % rectal cream Place 1 application rectally 3 (three) times daily as needed for hemorrhoids or itching.   Yes Historical Provider, MD  Insulin Glargine (LANTUS) 100 UNIT/ML Solostar Pen Inject 10 Units into the skin every evening.   Yes Historical Provider, MD  metFORMIN (GLUCOPHAGE) 1000 MG tablet Take 1,000 mg by mouth 2 (two) times daily with a meal.   Yes Historical Provider, MD  metoprolol tartrate (LOPRESSOR) 25 MG tablet Take 0.5 tablets (12.5 mg total) by mouth 2 (two) times daily. 02/19/16  Yes Hosie Poisson, MD  pantoprazole (PROTONIX) 40 MG tablet Take 40 mg by mouth daily.   Yes Historical Provider, MD  PARoxetine (PAXIL) 40 MG tablet Take 40 mg by mouth daily.    Yes Historical Provider, MD  polyvinyl alcohol (LIQUIFILM TEARS) 1.4 % ophthalmic solution Place 1 drop into both eyes 2 (two) times daily as needed for dry eyes. Natural Tears   Yes  Historical Provider, MD    Family History Family History  Problem Relation Age of Onset  . Heart disease Mother   . Hypertension Sister   . Heart disease Brother   . Heart disease Father   . Peripheral vascular disease Father   . Heart disease Maternal Grandmother   .  Heart attack Maternal Grandmother   . Breast cancer Maternal Grandmother   . Stomach cancer Maternal Uncle   . Colon cancer Neg Hx   . Stroke Neg Hx     Social History Social History  Substance Use Topics  . Smoking status: Former Smoker    Packs/day: 1.00    Years: 42.00    Types: Cigarettes, Pipe    Quit date: 11/10/2010  . Smokeless tobacco: Former Systems developer    Quit date: 10/02/2010  . Alcohol use No     Comment: " last drink was 2003" 08/11/15     Allergies   Penicillins   Review of Systems Review of Systems 10/14 systems reviewed and are negative other than those stated in the HPI   Physical Exam Updated Vital Signs BP (!) 129/49 (BP Location: Right Arm)   Pulse (P) 62   Temp (P) 98.5 F (36.9 C) (Oral)   Resp 20   Ht (P) 5' (1.524 m)   Wt (P) 187 lb 1.6 oz (84.9 kg) Comment: scale c  SpO2 95%   BMI (P) 36.54 kg/m   Physical Exam Physical Exam  Nursing note and vitals reviewed. Constitutional: non-toxic, and in no acute distress Head: Normocephalic and atraumatic.  Mouth/Throat: Oropharynx is clear and dry.  Neck: Normal range of motion. Neck supple.  Cardiovascular: Normal rate and regular rhythm.   Pulmonary/Chest: Effort normal and breath sounds normal.  Abdominal: Soft. Obese There is no tenderness. There is no rebound and no guarding.  Musculoskeletal: Normal range of motion.  Neurological: Alert, no facial droop, fluent speech, moves all extremities symmetrically Skin: Skin is warm and dry.  Psychiatric: Cooperative   ED Treatments / Results  Labs (all labs ordered are listed, but only abnormal results are displayed) Labs Reviewed  CBC WITH DIFFERENTIAL/PLATELET - Abnormal;  Notable for the following:       Result Value   RBC 3.31 (*)    Hemoglobin 8.1 (*)    HCT 26.5 (*)    MCH 24.5 (*)    RDW 15.7 (*)    All other components within normal limits  COMPREHENSIVE METABOLIC PANEL - Abnormal; Notable for the following:    Glucose, Bld 153 (*)    BUN 31 (*)    Calcium 8.8 (*)    Total Protein 5.8 (*)    Albumin 2.5 (*)    ALT 12 (*)    All other components within normal limits  URINALYSIS, ROUTINE W REFLEX MICROSCOPIC - Abnormal; Notable for the following:    Hgb urine dipstick SMALL (*)    Protein, ur 100 (*)    All other components within normal limits  BRAIN NATRIURETIC PEPTIDE - Abnormal; Notable for the following:    B Natriuretic Peptide 476.4 (*)    All other components within normal limits  GLUCOSE, CAPILLARY - Abnormal; Notable for the following:    Glucose-Capillary 118 (*)    All other components within normal limits  LIPASE, BLOOD  I-STAT TROPOININ, ED    EKG  EKG Interpretation None       Radiology Dg Chest 2 View  Result Date: 08/11/2016 CLINICAL DATA:  Central chest pain and dyspnea x2 days. EXAM: CHEST  2 VIEW COMPARISON:  02/13/2016 FINDINGS: Heart is top-normal in size. Stable aortic atherosclerosis. The patient is status post CABG. Trace right pleural effusion. Mild interstitial prominence consistent with interstitial edema. No acute osseous abnormality. IMPRESSION: Status post CABG. Mild diffuse interstitial prominence consistent with interstitial edema with small right  pleural effusion. Electronically Signed   By: Ashley Royalty M.D.   On: 08/11/2016 18:18    Procedures Procedures (including critical care time)  Medications Ordered in ED Medications  ondansetron (ZOFRAN) injection 4 mg (4 mg Intravenous Given 08/11/16 1916)  furosemide (LASIX) injection 40 mg (40 mg Intravenous Given 08/11/16 2019)     Initial Impression / Assessment and Plan / ED Course  I have reviewed the triage vital signs and the nursing  notes.  Pertinent labs & imaging results that were available during my care of the patient were reviewed by me and considered in my medical decision making (see chart for details).  Clinical Course    61 year old male who presents with shortness of breath and difficulty swallowing solid food. Is nontoxic in no acute distress. Does have 2 L oxygen remained here in the emergency Department for hypoxia and low 80 percentile room air. Does look like he has interstitial edema on chest x-ray with elevated BNP in the 400s. We'll treat for acute CHF exacerbation and given 40 mg of IV Lasix. Troponin negative and EKG not acutely ischemic. Remainder of blood work reassuring. Drinking fluids here w/o difficulty, but he states difficulty swallowing solids, ? Esophageal stricture vs other esophageal process.   Discussed with internal medicine teaching service. Admit under Dr. Beryle Beams.    Final Clinical Impressions(s) / ED Diagnoses   Final diagnoses:  Acute on chronic combined systolic and diastolic congestive heart failure ([place])  Dysphagia, unspecified type    New Prescriptions Current Discharge Medication List       Forde Dandy, MD 08/12/16 0128

## 2016-08-11 NOTE — ED Triage Notes (Signed)
Per EMS, pt transported from [facility], was there to see his doctor for abd pain with n/v/d for the past 2 days.  Pt is A&Ox 4.  Pt's sats was 84% RA upon EMS assessment.  COPD hx.

## 2016-08-11 NOTE — ED Notes (Signed)
Gave pt ice water, per Dr. Oleta Mouse.

## 2016-08-11 NOTE — ED Notes (Signed)
Pt reports abd pain with n/v x 2 days, went to his doctor at [facility] today d/t n/v.  While in the office, pt was found to be hypoxic at 70s on RA.  Pt reports he uses home O2 PRN.  Has hx of COPD.  Pt also reports non-radiating mid cp, worse after eating.  Reports a sharp stabbing pain.  Pt is A&Ox 4.

## 2016-08-12 ENCOUNTER — Inpatient Hospital Stay (HOSPITAL_COMMUNITY): Payer: Medicare (Managed Care)

## 2016-08-12 ENCOUNTER — Other Ambulatory Visit: Payer: Medicare (Managed Care)

## 2016-08-12 DIAGNOSIS — Z9981 Dependence on supplemental oxygen: Secondary | ICD-10-CM

## 2016-08-12 DIAGNOSIS — E785 Hyperlipidemia, unspecified: Secondary | ICD-10-CM

## 2016-08-12 DIAGNOSIS — I2510 Atherosclerotic heart disease of native coronary artery without angina pectoris: Secondary | ICD-10-CM

## 2016-08-12 DIAGNOSIS — Z794 Long term (current) use of insulin: Secondary | ICD-10-CM

## 2016-08-12 DIAGNOSIS — Z7982 Long term (current) use of aspirin: Secondary | ICD-10-CM

## 2016-08-12 DIAGNOSIS — Z8673 Personal history of transient ischemic attack (TIA), and cerebral infarction without residual deficits: Secondary | ICD-10-CM

## 2016-08-12 DIAGNOSIS — Z7901 Long term (current) use of anticoagulants: Secondary | ICD-10-CM

## 2016-08-12 DIAGNOSIS — D649 Anemia, unspecified: Secondary | ICD-10-CM

## 2016-08-12 DIAGNOSIS — Z8249 Family history of ischemic heart disease and other diseases of the circulatory system: Secondary | ICD-10-CM

## 2016-08-12 DIAGNOSIS — R1310 Dysphagia, unspecified: Secondary | ICD-10-CM

## 2016-08-12 DIAGNOSIS — Z951 Presence of aortocoronary bypass graft: Secondary | ICD-10-CM

## 2016-08-12 DIAGNOSIS — Z87891 Personal history of nicotine dependence: Secondary | ICD-10-CM

## 2016-08-12 DIAGNOSIS — I5043 Acute on chronic combined systolic (congestive) and diastolic (congestive) heart failure: Secondary | ICD-10-CM

## 2016-08-12 DIAGNOSIS — J449 Chronic obstructive pulmonary disease, unspecified: Secondary | ICD-10-CM

## 2016-08-12 DIAGNOSIS — E119 Type 2 diabetes mellitus without complications: Secondary | ICD-10-CM

## 2016-08-12 LAB — BASIC METABOLIC PANEL
Anion gap: 8 (ref 5–15)
BUN: 26 mg/dL — ABNORMAL HIGH (ref 6–20)
CALCIUM: 8.8 mg/dL — AB (ref 8.9–10.3)
CHLORIDE: 102 mmol/L (ref 101–111)
CO2: 30 mmol/L (ref 22–32)
CREATININE: 0.91 mg/dL (ref 0.61–1.24)
Glucose, Bld: 155 mg/dL — ABNORMAL HIGH (ref 65–99)
Potassium: 4 mmol/L (ref 3.5–5.1)
SODIUM: 140 mmol/L (ref 135–145)

## 2016-08-12 LAB — GLUCOSE, CAPILLARY
GLUCOSE-CAPILLARY: 115 mg/dL — AB (ref 65–99)
GLUCOSE-CAPILLARY: 165 mg/dL — AB (ref 65–99)
Glucose-Capillary: 143 mg/dL — ABNORMAL HIGH (ref 65–99)
Glucose-Capillary: 200 mg/dL — ABNORMAL HIGH (ref 65–99)

## 2016-08-12 LAB — CBC
HCT: 24.9 % — ABNORMAL LOW (ref 39.0–52.0)
HCT: 28.5 % — ABNORMAL LOW (ref 39.0–52.0)
Hemoglobin: 7.6 g/dL — ABNORMAL LOW (ref 13.0–17.0)
Hemoglobin: 9 g/dL — ABNORMAL LOW (ref 13.0–17.0)
MCH: 24.4 pg — ABNORMAL LOW (ref 26.0–34.0)
MCH: 25.4 pg — ABNORMAL LOW (ref 26.0–34.0)
MCHC: 30.5 g/dL (ref 30.0–36.0)
MCHC: 31.6 g/dL (ref 30.0–36.0)
MCV: 80.1 fL (ref 78.0–100.0)
MCV: 80.5 fL (ref 78.0–100.0)
PLATELETS: 286 10*3/uL (ref 150–400)
PLATELETS: 276 10*3/uL (ref 150–400)
RBC: 3.11 MIL/uL — AB (ref 4.22–5.81)
RBC: 3.54 MIL/uL — AB (ref 4.22–5.81)
RDW: 15.9 % — ABNORMAL HIGH (ref 11.5–15.5)
RDW: 15.4 % (ref 11.5–15.5)
WBC: 7.3 10*3/uL (ref 4.0–10.5)
WBC: 6.8 10*3/uL (ref 4.0–10.5)

## 2016-08-12 LAB — PREPARE RBC (CROSSMATCH)

## 2016-08-12 LAB — FERRITIN: Ferritin: 160 ng/mL (ref 24–336)

## 2016-08-12 MED ORDER — FUROSEMIDE 10 MG/ML IJ SOLN
40.0000 mg | Freq: Once | INTRAMUSCULAR | Status: AC
  Administered 2016-08-12: 40 mg via INTRAVENOUS
  Filled 2016-08-12: qty 4

## 2016-08-12 MED ORDER — FERROUS GLUCONATE 324 (38 FE) MG PO TABS
324.0000 mg | ORAL_TABLET | Freq: Every day | ORAL | Status: DC
  Administered 2016-08-13 – 2016-08-14 (×2): 324 mg via ORAL
  Filled 2016-08-12 (×2): qty 1

## 2016-08-12 MED ORDER — ONDANSETRON HCL 4 MG PO TABS: 4.0000 mg | ORAL_TABLET | Freq: Four times a day (QID) | ORAL | Status: DC | PRN

## 2016-08-12 MED ORDER — DABIGATRAN ETEXILATE MESYLATE 150 MG PO CAPS: 150.0000 mg | ORAL_CAPSULE | Freq: Two times a day (BID) | ORAL | Status: DC

## 2016-08-12 MED ORDER — SUCRALFATE 1 GM/10ML PO SUSP
1.0000 g | Freq: Three times a day (TID) | ORAL | Status: DC
  Administered 2016-08-12 – 2016-08-14 (×8): 1 g via ORAL
  Filled 2016-08-12 (×8): qty 10

## 2016-08-12 MED ORDER — AMLODIPINE BESYLATE 5 MG PO TABS
5.0000 mg | ORAL_TABLET | Freq: Every day | ORAL | Status: DC
  Administered 2016-08-12 – 2016-08-14 (×3): 5 mg via ORAL
  Filled 2016-08-12 (×3): qty 1

## 2016-08-12 MED ORDER — ACETAMINOPHEN 325 MG PO TABS
650.0000 mg | ORAL_TABLET | Freq: Four times a day (QID) | ORAL | Status: DC | PRN
Start: 2016-08-12 — End: 2016-08-14

## 2016-08-12 MED ORDER — POLYVINYL ALCOHOL 1.4 % OP SOLN
1.0000 [drp] | Freq: Two times a day (BID) | OPHTHALMIC | Status: DC | PRN
  Filled 2016-08-12: qty 15

## 2016-08-12 MED ORDER — ORAL CARE MOUTH RINSE
15.0000 mL | Freq: Two times a day (BID) | OROMUCOSAL | Status: DC
  Administered 2016-08-12 – 2016-08-14 (×3): 15 mL via OROMUCOSAL

## 2016-08-12 MED ORDER — INSULIN ASPART 100 UNIT/ML ~~LOC~~ SOLN
0.0000 [IU] | Freq: Three times a day (TID) | SUBCUTANEOUS | Status: DC
  Administered 2016-08-12: 2 [IU] via SUBCUTANEOUS
  Administered 2016-08-12 – 2016-08-13 (×2): 3 [IU] via SUBCUTANEOUS
  Administered 2016-08-13: 5 [IU] via SUBCUTANEOUS
  Administered 2016-08-13: 3 [IU] via SUBCUTANEOUS
  Administered 2016-08-14: 8 [IU] via SUBCUTANEOUS
  Administered 2016-08-14: 3 [IU] via SUBCUTANEOUS

## 2016-08-12 MED ORDER — FUROSEMIDE 10 MG/ML IJ SOLN
40.0000 mg | Freq: Two times a day (BID) | INTRAMUSCULAR | Status: DC
  Administered 2016-08-12 – 2016-08-14 (×5): 40 mg via INTRAVENOUS
  Filled 2016-08-12 (×5): qty 4

## 2016-08-12 MED ORDER — PANTOPRAZOLE SODIUM 40 MG PO TBEC
40.0000 mg | DELAYED_RELEASE_TABLET | Freq: Every day | ORAL | Status: DC
  Administered 2016-08-12 – 2016-08-14 (×3): 40 mg via ORAL
  Filled 2016-08-12 (×3): qty 1

## 2016-08-12 MED ORDER — ASPIRIN EC 81 MG PO TBEC: 81.0000 mg | DELAYED_RELEASE_TABLET | Freq: Every day | ORAL | Status: DC

## 2016-08-12 MED ORDER — FUROSEMIDE 10 MG/ML IJ SOLN: 40.0000 mg | Freq: Two times a day (BID) | INTRAMUSCULAR | Status: DC

## 2016-08-12 MED ORDER — METOPROLOL TARTRATE 12.5 MG HALF TABLET
12.5000 mg | ORAL_TABLET | Freq: Two times a day (BID) | ORAL | Status: DC
  Administered 2016-08-12 – 2016-08-14 (×5): 12.5 mg via ORAL
  Filled 2016-08-12 (×5): qty 1

## 2016-08-12 MED ORDER — FUROSEMIDE 10 MG/ML IJ SOLN: 40.0000 mg | Freq: Every day | INTRAMUSCULAR | Status: DC

## 2016-08-12 MED ORDER — PAROXETINE HCL 20 MG PO TABS
40.0000 mg | ORAL_TABLET | Freq: Every day | ORAL | Status: DC
  Administered 2016-08-12 – 2016-08-14 (×3): 40 mg via ORAL
  Filled 2016-08-12 (×3): qty 2

## 2016-08-12 MED ORDER — FERROUS GLUCONATE 324 (38 FE) MG PO TABS
324.0000 mg | ORAL_TABLET | Freq: Every day | ORAL | Status: DC
  Administered 2016-08-12: 324 mg via ORAL
  Filled 2016-08-12 (×3): qty 1

## 2016-08-12 MED ORDER — VITAMIN D 1000 UNITS PO TABS
1000.0000 [IU] | ORAL_TABLET | Freq: Every day | ORAL | Status: DC
  Administered 2016-08-12 – 2016-08-14 (×3): 1000 [IU] via ORAL
  Filled 2016-08-12 (×3): qty 1

## 2016-08-12 MED ORDER — ONDANSETRON HCL 4 MG/2ML IJ SOLN: 4.0000 mg | Freq: Four times a day (QID) | INTRAMUSCULAR | Status: DC | PRN

## 2016-08-12 MED ORDER — SODIUM CHLORIDE 0.9 % IV SOLN
Freq: Once | INTRAVENOUS | Status: AC
  Administered 2016-08-12: 250 mL via INTRAVENOUS

## 2016-08-12 MED ORDER — CALCIUM CARBONATE ANTACID 500 MG PO CHEW: 1.0000 | CHEWABLE_TABLET | Freq: Three times a day (TID) | ORAL | Status: DC | PRN

## 2016-08-12 MED ORDER — CLONAZEPAM 0.5 MG PO TABS
0.5000 mg | ORAL_TABLET | Freq: Every day | ORAL | Status: DC
  Administered 2016-08-12 – 2016-08-13 (×2): 0.5 mg via ORAL
  Filled 2016-08-12 (×2): qty 1

## 2016-08-12 MED ORDER — FLUDROCORTISONE ACETATE 0.1 MG PO TABS
0.1000 mg | ORAL_TABLET | Freq: Every day | ORAL | Status: DC
  Administered 2016-08-12 – 2016-08-14 (×3): 0.1 mg via ORAL
  Filled 2016-08-12 (×3): qty 1

## 2016-08-12 MED ORDER — ATORVASTATIN CALCIUM 80 MG PO TABS
80.0000 mg | ORAL_TABLET | Freq: Every day | ORAL | Status: DC
  Administered 2016-08-12 – 2016-08-14 (×3): 80 mg via ORAL
  Filled 2016-08-12 (×3): qty 1

## 2016-08-12 MED ORDER — GABAPENTIN 600 MG PO TABS
600.0000 mg | ORAL_TABLET | Freq: Two times a day (BID) | ORAL | Status: DC
  Administered 2016-08-12 – 2016-08-14 (×6): 600 mg via ORAL
  Filled 2016-08-12 (×6): qty 1

## 2016-08-12 MED ORDER — METOPROLOL TARTRATE 5 MG/5ML IV SOLN
2.5000 mg | Freq: Once | INTRAVENOUS | Status: AC
  Administered 2016-08-13: 2.5 mg via INTRAVENOUS
  Filled 2016-08-12: qty 5

## 2016-08-12 MED ORDER — ACETAMINOPHEN 650 MG RE SUPP: 650.0000 mg | Freq: Four times a day (QID) | RECTAL | Status: DC | PRN

## 2016-08-12 MED ORDER — HYDROCORTISONE 2.5 % RE CREA
1.0000 "application " | TOPICAL_CREAM | Freq: Three times a day (TID) | RECTAL | Status: DC | PRN
  Filled 2016-08-12: qty 28.35

## 2016-08-12 MED ORDER — HYDROCODONE-ACETAMINOPHEN 5-325 MG PO TABS
1.0000 | ORAL_TABLET | Freq: Every day | ORAL | Status: DC
  Administered 2016-08-12 – 2016-08-14 (×3): 1 via ORAL
  Filled 2016-08-12 (×3): qty 1

## 2016-08-12 NOTE — Evaluation (Signed)
Clinical/Bedside Swallow Evaluation Patient Details  Name: [name] MRN: [id number] Date of Birth: 08/09/1955  Today's Date: 08/12/2016 Time: SLP Start Time (ACUTE ONLY): 1104 SLP Stop Time (ACUTE ONLY): 1113 SLP Time Calculation (min) (ACUTE ONLY): 9 min  Past Medical History:  Past Medical History:  Diagnosis Date  . Abdominal hernia    Chronic, not a good surgical candidate  . Abscess, abdomen ([place]) 12/31/2010   Referred to Wound Care in 01/2011 because of multiple abd abscess with VERY large ventral hernia (please look at image of CT abd/pelvis 09/2010).  Because of hernia I was hesitant to I&D.      Marland Kitchen Anemia    History of Iron Def Anemia  . Anxiety   . Carotid artery occlusion   . Chronic diastolic CHF (congestive heart failure) (HCC)    takes Lasix  . Chronic low back pain   . COPD (chronic obstructive pulmonary disease) ([place])   . CVA (cerebral infarction) 09/2010   Bilateral with Left > Right  . Diabetes mellitus   . GERD (gastroesophageal reflux disease)   . History of nuclear stress test    Myoview 9/16:  Inferior, apical and inf-lateral ischemia; not gated; High Risk  . Hx of echocardiogram    Echo 5/16:  Mild LVH, EF 55%, indeterm. diast function, WMA could not be ruled out, MAC, trivial MR, mild LAE, normal RVF //  b. Echo 4/17: EF 55-60%, no RWMA, Gr 2 DD, Ao sclerosis, MAC, mild MS, mild LAE, PASP 55 mHg  . Hyperlipidemia   . Hypertension   . Intellectual disability    Sister helps to take care of him and takes him to appts  . Itching    all over body; pt scratches and has sores on bilateral arms and abdomen  . Lung nodule   . Myocardial infarction 2016 ?   Heart attack  (  Per  pt. )  . Obesity   . Oxygen dependent    wears 2 liters at bedtime and when needed  . PAF (paroxysmal atrial fibrillation) ([place]) 06/2009   CHADS score 2 (HTN, DM), was not on coumadin, but now on Pradaxa for Afib  . Pneumonia    hx of  . Shortness of breath dyspnea    with  exertion  . Stroke ([place])   . Tobacco user    Smokes 1ppd for multiple years.  Quit after hosp 09/2010.  . Wears glasses    Past Surgical History:  Past Surgical History:  Procedure Laterality Date  . AMPUTATION Right 03/29/2013   Procedure: AMPUTATION RAY;  Surgeon: Newt Minion, MD;  Location: [facility];  Service: Orthopedics;  Laterality: Right;  Right Foot 3rd and Possible 4th Ray Amputation  . AMPUTATION Right 04/23/2013   Procedure: AMPUTATION RIGHT MID-FOOT;  Surgeon: Newt Minion, MD;  Location: [facility];  Service: Orthopedics;  Laterality: Right;  . arm surgery Left    as a child  . CARDIAC CATHETERIZATION N/A 04/30/2015   Procedure: Left Heart Cath and Coronary Angiography;  Surgeon: Belva Crome, MD;  Location: [facility];  Service: Cardiovascular;  Laterality: N/A;  . Carotid arteriogram  10/2010   30% right ICA stenosis, 40% left ICA stenosis   . CAROTID ENDARTERECTOMY Left 08-13-15   CEA  . CATARACT EXTRACTION, BILATERAL    . COLONOSCOPY WITH PROPOFOL N/A 06/10/2014   Procedure: COLONOSCOPY WITH PROPOFOL;  Surgeon: Jerene Bears, MD;  [facility];  Service: Gastroenterology;  Laterality:  N/A;  . CORONARY ARTERY BYPASS GRAFT N/A 08/13/2015   Procedure: CORONARY ARTERY BYPASS GRAFTING (CABG), ON PUMP, TIMES THREE, USING LEFT INTERNAL MAMMARY ARTERY, RIGHT GREATER SAPHENOUS VEIN HARVESTED ENDOSCOPICALLY;  Surgeon: [name], MD;  Location: [facility];  Service: Open Heart Surgery;  Laterality: N/A;  . DEBRIDMENT OF DECUBITUS ULCER Right 02/13/2013  . ENDARTERECTOMY Left 08/13/2015   Procedure: ENDARTERECTOMY CAROTID;  Surgeon: Angelia Mould, MD;  Location: [facility];  Service: Vascular;  Laterality: Left;  . ESOPHAGOGASTRODUODENOSCOPY (EGD) WITH PROPOFOL N/A 06/10/2014   Procedure: ESOPHAGOGASTRODUODENOSCOPY (EGD) WITH PROPOFOL;  Surgeon: Jerene Bears, MD;  [facility];  Service: Gastroenterology;  Laterality: N/A;  . GIVENS CAPSULE STUDY N/A 07/09/2014    Procedure: GIVENS CAPSULE STUDY;  Surgeon: Jerene Bears, MD;  [facility];  Service: Gastroenterology;  Laterality: N/A;  . I&D EXTREMITY Right 02/13/2013   Procedure: IRRIGATION AND DEBRIDEMENT FOOT ULCER;  Surgeon: Johnny Bridge, MD;  Location: [facility];  Service: Orthopedics;  Laterality: Right;  PULSE LAVAGE  . MULTIPLE TOOTH EXTRACTIONS    . TEE WITHOUT CARDIOVERSION N/A 08/13/2015   Procedure: TRANSESOPHAGEAL ECHOCARDIOGRAM (TEE);  Surgeon: [name], MD;  Location: [facility];  Service: Open Heart Surgery;  Laterality: N/A;  . TRANSESOPHAGEAL ECHOCARDIOGRAM  09/2010   No ASD or PFO. EF 60-65%.  Normal systolic function. No evidence of thrombus.   . TRANSTHORACIC ECHOCARDIOGRAM  09/2010    The cavity size was normal. Systolic function was vigorous.  EF 65-70%.  Normal wall funciton.    HPI:  61yo male wit hPMH of COPD on intermittent O2 at home, h/o CVA, CAD s/p CABG, PAF, T2DM, combined CHF and 80+pack year h/o of tobacco abuse presenting with 2-3 day history of vomiting after eating. He states that it feels like food gets stuck in the bottom of his chest every time he eats and he throws it up about 1 minute afterwards.    Assessment / Plan / Recommendation Clinical Impression  Suspect that pt has a primary esophageal dysphagia due to subjective c/o solid foods getting "stuck", regurgitation after meals, and no overt signs of aspiration. SLP reviewed esophageal precautions. Will not start diet order yet due to esophagram planned for this afternoon, but recommend DYs 3 diet and thin liquids as able pending results. No acute SLP needs.     Aspiration Risk  Mild aspiration risk    Diet Recommendation Dysphagia 3 (Mech soft);Thin liquid   Liquid Administration via: Cup;Straw Medication Administration: Other (Comment) (as tolerated pending results of esophagram) Supervision: Patient able to self feed;Intermittent supervision to cue for compensatory strategies Compensations: Slow  rate;Small sips/bites;Follow solids with liquid Postural Changes: Seated upright at 90 degrees;Remain upright for at least 30 minutes after po intake    Other  Recommendations Recommended Consults: Consider esophageal assessment Oral Care Recommendations: Oral care BID   Follow up Recommendations None      Frequency and Duration            Prognosis        Swallow Study   General HPI: 61yo male wit hPMH of COPD on intermittent O2 at home, h/o CVA, CAD s/p CABG, PAF, T2DM, combined CHF and 80+pack year h/o of tobacco abuse presenting with 2-3 day history of vomiting after eating. He states that it feels like food gets stuck in the bottom of his chest every time he eats and he throws it up about 1 minute afterwards.  Type of Study: Bedside Swallow  Evaluation Previous Swallow Assessment: none in chart Diet Prior to this Study: NPO Temperature Spikes Noted: No Respiratory Status: Nasal cannula History of Recent Intubation: No Behavior/Cognition: Alert;Cooperative;Pleasant mood Oral Care Completed by SLP: No Oral Cavity - Dentition: Edentulous Vision: Functional for self-feeding Self-Feeding Abilities: Able to feed self Patient Positioning: Other (comment) (EOB) Baseline Vocal Quality: Normal Volitional Swallow: Able to elicit    Oral/Motor/Sensory Function Overall Oral Motor/Sensory Function: Within functional limits   Ice Chips Ice chips: Not tested   Thin Liquid Thin Liquid: Within functional limits Presentation: Self Fed;Straw    Nectar Thick Nectar Thick Liquid: Not tested   Honey Thick Honey Thick Liquid: Not tested   Puree Puree: Within functional limits Presentation: Self Fed;Spoon   Solid   GO   Solid: Within functional limits Presentation: Self [place] 08/12/2016,11:35 AM  Germain Osgood, M.A. CCC-SLP [phone]

## 2016-08-12 NOTE — Progress Notes (Signed)
Pt refused bed alarm on.  Verbalized will call for assistance as needed.  Will continue to monitor.  Karie Kirks, Therapist, sports.

## 2016-08-12 NOTE — Progress Notes (Signed)
Pt's hb is dropped to 7.6 from 8.1 this morning, MD aware, type and screen done, RN waiting for the blood bank . Blood consent is done, pt is aware of the situation, will continue to monitor the patient.

## 2016-08-12 NOTE — Progress Notes (Signed)
New pt admission from ED. Pt brought to the floor in stable condition. Vitals taken. Initial Assessment done. All immediate pertinent needs to patient addressed. Patient Guide given to patient. Important safety instructions relating to hospitalization reviewed with patient. Patient verbalized understanding. Will continue to monitor pt. 

## 2016-08-12 NOTE — Progress Notes (Signed)
Subjective: [name] was seen and evaluated today at bedside. He has no acute complaints. Reports his breathing is better since admission. Reports his reason for presentation to the ED was worsening SOB. Admitted to intermittent non-compliance with his Lasix as sometimes he gets busy. Reports he drinks 2-3 16.9oz water bottles daily + 4-5 cups of Kool-Aid. Reports upper abdominal discomfort after eating solid and liquids, and that he has NB/NV emesis shortly after eating.   Objective:  Vital signs in last 24 hours: Vitals:   08/11/16 2000 08/11/16 2015 08/11/16 2336 08/12/16 0406  BP: (!) 133/50 (!) 128/47 (!) 129/49 (!) 146/56  Pulse: (!) 59 (!) 59 62 63  Resp: 19 23 20 20   Temp:   98.5 F (36.9 C) 97.9 F (36.6 C)  TempSrc:   Oral Oral  SpO2: 94% 93% 95% 94%  Weight:   187 lb 1.6 oz (84.9 kg) 186 lb 1.6 oz (84.4 kg)  Height:   5' (1.524 m)    General: Chronically-ill appearing caucasian male resting comfortably in bed. In no acute distress. On 3L via [place]. HENT: EOMI. No conjunctival injection or icterus. ?ptosis left eye.  Cardiovascular: NSR. No murmur or rub appreciated. Pulmonary: Diffuse rales. Breathing unlabored. Abdomen: Soft, non-tender. Very large ventral hernia apparent. No guarding or rigidity. +bowel sounds.  Skin: Warm, dry. No cyanosis. Pale  Assessment/Plan:  Active Problems:   Acute exacerbation of CHF (congestive heart failure) (HCC)   Acute on chronic combined systolic and diastolic congestive heart failure (HCC)   Dysphagia   Acute on Chronic Combined CHF, Ischemic Cardiomyopathy C/o worsening DOE, orthopnea and increased cough + sputum production. CXR consistent with volume-overload and had visible edema on examination + bibasilar crackles. BNP elevated at 470. Chart review shows an unclear dry weight, ~182 lbs, was 187 on admission and is now 186. Pt endorses intermittent non-compliance with lasix due to "getting busy" sometimes. He also reports he has  never been told about fluid restriction and drinks 2-3 water bottles daily + 4-5 glasses of Kool-aid lately. Reports he is able to eat soup however. Suspect that due to patients dysphagia he has been increasing his fluid intake and this combined with his CHF + intermittent medication non-compliance was the driving force for his current exacerbation.  -Continue diuresis with IV Lasix 40 mg BID and will continue to monitor his response -Renal fxn stable and will monitor with diuresis -Pt and sister who is his primary care-taker will need education on CHF dietary management, including sodium and fluid restriction -I&O + daily weights  Dysphagia  Acute onset dysphagia - described as feeling like food gets stuck in the bottom of his chest and throws up about 1 minute after eating. He does have multiple RFs for a more serious etiology including 80+ pack-year hx of tobacco and alcohol abuse as well. Chart review shows a 20-lb weight loss over the past yr. Denied taking PPI at home. Pt on iron and will yield +FOBT for this reason. -Barium swallow ordered overnight, will follow up results of that.  -Swallow eval recs dysphagia 3 diet -will start AFTER esophagram -Protonix 40 mg daily -Will likely need GI evaluation   Acute on Chronic Anemia Pt endorses intermittent occasional melena. BUN elevated, suggesting an UGI bleed. EGD + capsule endoscopy in 2015 revealed gastritis + non-bleeding AVMs. These certainly could be the cause of pts anemia as he denied any other sources of blood loss. He was transfused 1 unit of PRBs today and will  f/u post-transfusion H&H -F/u post transfusion CBC  PAF Holding Pradexa and ASA in setting of anemia and ?GI bleed. Was in NSR on examination.  HTN Continue home regimen of Amlodipine 5 mg and metoprolol 12.5 mg BID.   MDD/Anxiety/Hypoaldosteronism: Continuing home Paxil, clonazepam and florinef  Dispo: Anticipated discharge in approximately 1-2 day(s).   Joseph Bienkowski,  DO 08/12/2016, 7:14 AM Pager: [phone]

## 2016-08-12 NOTE — H&P (Signed)
Date: 08/11/2016               Patient Name:  [name] MRN: [id number]  DOB: 03/15/1956 Age / Sex: 61 y.o., male   PCP: Joseph Adie, MD         Medical Service: Internal Medicine Teaching Service         Attending Physician: Dr. Annia Belt, MD    First Contact: Dr. Danford [name] Pager: [phone]  Second Contact: Dr. Tiburcio [name] Pager: [phone]       After Hours (After 5p/  First Contact Pager: [phone]  weekends / holidays): Second Contact Pager: [phone]   Chief Complaint: vomiting, diarrhea, shortness of breath  History of Present Illness:  [name] is a 61yo male wit hPMH of COPD on intermittent O2 at home, h/o CVA, CAD s/p CABG, PAF, T2DM, combined CHF and 80+pack year h/o of tobacco abuse presenting with 2-3 day history of vomiting after eating. He states that it feels like food gets stuck in the bottom of his chest every time he eats and he throws it up about 1 minute afterwards. He denies blood in vomit. He has some discomfort in the same place when drinking fluids but is able to keep them down. He endorses one day of nonbloody, nonmelanotic diarrhea that has since stopped. He does have left sided abdominal pain that began after he arrived to the ED and admits to intermittent melena.   He endorses one day history of increased shortness of breath at exertion, orthopnea, increase in cough and sputum production. He endorses intermittent chest pain that is unchanged for him and relieved with nitroglycerin; last episode of chest pain was day before admission and was not different from priors. He denies sick contacts, fever, chills, dysuria, hematuria, change in frequency, or urgency (other than following IV lasix).   Patient was found to be hypoxic on RA by EMS to low 80's.   Meds:  Current Meds  Medication Sig  . acetaminophen (TYLENOL) 325 MG tablet Take 650 mg by mouth 3 (three) times daily as needed for mild pain.  Marland Kitchen amLODipine (NORVASC) 5 MG tablet Take 5 mg by  mouth daily.  Marland Kitchen aspirin EC 81 MG EC tablet Take 1 tablet (81 mg total) by mouth daily.  Marland Kitchen atorvastatin (LIPITOR) 80 MG tablet Take 80 mg by mouth daily.  . calcium carbonate (TUMS - DOSED IN MG ELEMENTAL CALCIUM) 500 MG chewable tablet Chew 1 tablet by mouth 3 (three) times daily as needed for indigestion or heartburn.  . Cholecalciferol (VITAMIN D3) 1000 UNITS CAPS Take 1,000 Units by mouth daily.  . clonazePAM (KLONOPIN) 0.5 MG tablet Take 1 tablet (0.5 mg total) by mouth at bedtime.  . dabigatran (PRADAXA) 150 MG CAPS capsule Take 150 mg by mouth 2 (two) times daily.  . ferrous gluconate (FERGON) 324 MG tablet Take 1 tablet (324 mg total) by mouth daily with breakfast.  . fludrocortisone (FLORINEF) 0.1 MG tablet Take 0.1 mg by mouth daily.  . furosemide (LASIX) 40 MG tablet Take 40 mg by mouth daily.  Marland Kitchen gabapentin (NEURONTIN) 600 MG tablet Take 600 mg by mouth 2 (two) times daily.  Marland Kitchen HYDROcodone-acetaminophen (NORCO) 5-325 MG tablet Take 1 tablet by mouth daily.  . hydrocortisone (ANUSOL-HC) 2.5 % rectal cream Place 1 application rectally 3 (three) times daily as needed for hemorrhoids or itching.  . Insulin Glargine (LANTUS) 100 UNIT/ML Solostar Pen Inject 10 Units into the skin every evening.  . metFORMIN (  GLUCOPHAGE) 1000 MG tablet Take 1,000 mg by mouth 2 (two) times daily with a meal.  . metoprolol tartrate (LOPRESSOR) 25 MG tablet Take 0.5 tablets (12.5 mg total) by mouth 2 (two) times daily.  . pantoprazole (PROTONIX) 40 MG tablet Take 40 mg by mouth daily.  Marland Kitchen PARoxetine (PAXIL) 40 MG tablet Take 40 mg by mouth daily.   . polyvinyl alcohol (LIQUIFILM TEARS) 1.4 % ophthalmic solution Place 1 drop into both eyes 2 (two) times daily as needed for dry eyes. Natural Tears   Allergies: Allergies as of 08/11/2016 - Review Complete 08/11/2016  Allergen Reaction Noted  . Penicillins Hives, Nausea And Vomiting, Swelling, and Other (See Comments) 06/22/2007   Past Medical History:  Diagnosis  Date  . Abdominal hernia    Chronic, not a good surgical candidate  . Abscess, abdomen ([place]) 12/31/2010   Referred to Wound Care in 01/2011 because of multiple abd abscess with VERY large ventral hernia (please look at image of CT abd/pelvis 09/2010).  Because of hernia I was hesitant to I&D.      Marland Kitchen Anemia    History of Iron Def Anemia  . Anxiety   . Carotid artery occlusion   . Chronic diastolic CHF (congestive heart failure) (HCC)    takes Lasix  . Chronic low back pain   . COPD (chronic obstructive pulmonary disease) ([place])   . CVA (cerebral infarction) 09/2010   Bilateral with Left > Right  . Diabetes mellitus   . GERD (gastroesophageal reflux disease)   . History of nuclear stress test    Myoview 9/16:  Inferior, apical and inf-lateral ischemia; not gated; High Risk  . Hx of echocardiogram    Echo 5/16:  Mild LVH, EF 55%, indeterm. diast function, WMA could not be ruled out, MAC, trivial MR, mild LAE, normal RVF //  b. Echo 4/17: EF 55-60%, no RWMA, Gr 2 DD, Ao sclerosis, MAC, mild MS, mild LAE, PASP 55 mHg  . Hyperlipidemia   . Hypertension   . Intellectual disability    Sister helps to take care of him and takes him to appts  . Itching    all over body; pt scratches and has sores on bilateral arms and abdomen  . Lung nodule   . Myocardial infarction 2016 ?   Heart attack  (  Per  pt. )  . Obesity   . Oxygen dependent    wears 2 liters at bedtime and when needed  . PAF (paroxysmal atrial fibrillation) ([place]) 06/2009   CHADS score 2 (HTN, DM), was not on coumadin, but now on Pradaxa for Afib  . Pneumonia    hx of  . Shortness of breath dyspnea    with exertion  . Stroke ([place])   . Tobacco user    Smokes 1ppd for multiple years.  Quit after hosp 09/2010.  . Wears glasses     Family History: Mother and father with heart problems.    Social History: Former smoker; 80+ pack years; quit at age 36. Endorses prior alcohol use; denies illicit drug use.  Review of Systems: A  complete ROS was negative except as per HPI.   Physical Exam: Blood pressure (!) 129/49, pulse (P) 62, temperature (P) 98.5 F (36.9 C), temperature source (P) Oral, resp. rate 20, height (P) 5' (1.524 m), weight (P) 187 lb 1.6 oz (84.9 kg), SpO2 95 %. (weight 195 in ED, 187 on floor) General: Pale, alert, well-developed, and cooperative to examination.  Head: normocephalic and  atraumatic.  Eyes: vision grossly intact, pupils equal, pupils round, pupils reactive to light, no injection and anicteric.  Mouth: pharynx pink and moist, no erythema, and no exudates.  Neck: supple, full ROM, no thyromegaly, +JVD. Left endarterectomy scar  Lungs: normal respiratory effort, no accessory muscle use, Bibasilar crackles, no rhonchi or wheezing. Heart: normal rate, regular rhythm, systolic ejection murmur, no gallop, and no rub.  Abdomen: Obese, soft, +BS, mildly tender in LLQ.  Msk: no joint swelling, no joint warmth, and no redness over joints.  Pulses: 2+ DP/PT pulses bilaterally Extremities: No cyanosis, clubbing. 1+ pretibial pitting edema in bil LE Neurologic: alert & oriented X3, cranial nerves II-XII intact, strength normal in all extremities, sensation intact to light touch.  Skin: turgor normal, Excoriations on arms and back - nonerythematous, nondraining, not indurated.  Psych: Oriented X3, memory intact for recent and remote, normally interactive, good eye contact, not anxious appearing, and not depressed appearing  LABS: Na 139, K 4.1, CL 104, CO2 27, BUN 31, Cr 1.00, Glu 153 Alk phos47, AST 15, ALT 12, albumin 2.5, bili 0.4, total protein 5.8 WBC 7.9, Hgb 8.1 (b/l 8-10) BNP 476 (prior 50) I stat troponin negative  EKG: Personally reviewed; sinus rhythm, RBBB, no acute ST or T wave changes  CXR: Personally reviewed; mild interstitial edema with small right pleural effusion  EGD by Dr. Hilarie Fredrickson 06/2014: Gastritis; normal esophagus, no H pylori  Capsule endoscopy 07/2014: Gastritis,  small AVM's  ECHO 02/2016: LVEF 45-50%; mild LVH; diffuse hypokinesis  Assessment & Plan by Problem: Active Problems:   Acute exacerbation of CHF (congestive heart failure) (HCC)  Acute exacerbation of combined CHF: Patient with history of combined CHF presenting with shortness of breath, orthopnea, increased cough and sputum production. CXR consistent with mild interstitial edema and BNP elevated to 470 (last was 50). Patient appears volume overloaded on exam. Dry weight appears to be around 182lbs (187 currently). Last Echo in July 2017 shows LVEF 45-50%, diffuse hypokinesis.  --s/p 69m lasix IV in ED; repeat 468mLasix IV and  re-evaluate in AM for continued IV diuresis --strict ins/outs; daily weights --f/u BMP in AM  Esophageal Dysphagia: Patient with history of gastritis and new onset of dysphagia with regurgitation of food. Differential includes stricture, mass, esophageal spasms or webs. Last EGD in 2015 shows normal esophagus with gastritis. --f/u Barium swallow --Swallow eval --Protonix 409maily --zofran PRN  Acute on chronic anemia: Patient with history of iron-deficiency anemia on iron supplementation. EGD and colonoscopy positive for gastritis and one tubular adenoma. Hgb b/l 8-10.5, and currently 8.1 on admission. No evidence of acute bleeding; patient reports intermittent melena but is on iron therapy. Patient on ASA and pradexa. BUN slightly elevated and h/o small, GI, non-bleeding AVM's. **repeat CBC shows Hgb of 7.6 --transfuse 1U pRBC's --f/u post-transfusion CBC --consider GI consult in AM   HTN: Patient with history of hypertension on home regimen of amlodipine 5mg31mily, metoprolol tartrate 12.5mg 63m. Normotensive on arrival. --continue home regimen  PAF: Patient with history of PAF on pradexa. Currently sinus rhythm. --continue pradexa 150mg 61m- hold  CAD s/p CABG: Patient with CABG in 08/2015. On home regimen asa 81mg d5m and atorvastatin 80mg  d83m. --hold asa 81mg dai43m-continue atorvastatin 80mg dail18m2DM: Patient with T2DM, last A1C 5.8 one year prior. On home regimen of metformin 1000mg BID, 44mus 10U qhs.  --SSI-M  HLD: Patient on home regimen of atorvastatin 80mg daily 49mntinued.  MDD/Anxiety: Patient on home  regimen of paxil 55m daily, clonazepam 0.548mqhs - continued  Hypoaldosteronism: Patient continued on home dose of florinef 0.5m39maily  DVT: Pradexa Diet: NPO IVF: none Code: FULL  Dispo: Admit patient to Inpatient with expected length of stay greater than 2 midnights.  Signed: GorAlphonzo GrieveD 08/12/2016, 12:51 AM  Pager [phone]

## 2016-08-13 ENCOUNTER — Other Ambulatory Visit: Payer: Self-pay

## 2016-08-13 DIAGNOSIS — Q394 Esophageal web: Secondary | ICD-10-CM

## 2016-08-13 DIAGNOSIS — Z79899 Other long term (current) drug therapy: Secondary | ICD-10-CM

## 2016-08-13 DIAGNOSIS — Z8719 Personal history of other diseases of the digestive system: Secondary | ICD-10-CM

## 2016-08-13 DIAGNOSIS — Z9114 Patient's other noncompliance with medication regimen: Secondary | ICD-10-CM

## 2016-08-13 DIAGNOSIS — K228 Other specified diseases of esophagus: Secondary | ICD-10-CM

## 2016-08-13 DIAGNOSIS — I480 Paroxysmal atrial fibrillation: Secondary | ICD-10-CM

## 2016-08-13 DIAGNOSIS — K469 Unspecified abdominal hernia without obstruction or gangrene: Secondary | ICD-10-CM

## 2016-08-13 DIAGNOSIS — R1310 Dysphagia, unspecified: Secondary | ICD-10-CM

## 2016-08-13 DIAGNOSIS — F418 Other specified anxiety disorders: Secondary | ICD-10-CM

## 2016-08-13 DIAGNOSIS — I255 Ischemic cardiomyopathy: Secondary | ICD-10-CM

## 2016-08-13 DIAGNOSIS — I110 Hypertensive heart disease with heart failure: Secondary | ICD-10-CM

## 2016-08-13 DIAGNOSIS — I4510 Unspecified right bundle-branch block: Secondary | ICD-10-CM

## 2016-08-13 DIAGNOSIS — I252 Old myocardial infarction: Secondary | ICD-10-CM

## 2016-08-13 DIAGNOSIS — D500 Iron deficiency anemia secondary to blood loss (chronic): Secondary | ICD-10-CM

## 2016-08-13 DIAGNOSIS — E2740 Unspecified adrenocortical insufficiency: Secondary | ICD-10-CM

## 2016-08-13 DIAGNOSIS — Z9889 Other specified postprocedural states: Secondary | ICD-10-CM

## 2016-08-13 DIAGNOSIS — L304 Erythema intertrigo: Secondary | ICD-10-CM

## 2016-08-13 DIAGNOSIS — R001 Bradycardia, unspecified: Secondary | ICD-10-CM

## 2016-08-13 LAB — GLUCOSE, CAPILLARY
GLUCOSE-CAPILLARY: 210 mg/dL — AB (ref 65–99)
GLUCOSE-CAPILLARY: 190 mg/dL — AB (ref 65–99)
Glucose-Capillary: 158 mg/dL — ABNORMAL HIGH (ref 65–99)
Glucose-Capillary: 214 mg/dL — ABNORMAL HIGH (ref 65–99)

## 2016-08-13 LAB — BASIC METABOLIC PANEL
Anion gap: 9 (ref 5–15)
BUN: 24 mg/dL — AB (ref 6–20)
CHLORIDE: 102 mmol/L (ref 101–111)
CO2: 29 mmol/L (ref 22–32)
Calcium: 8.8 mg/dL — ABNORMAL LOW (ref 8.9–10.3)
Creatinine, Ser: 0.91 mg/dL (ref 0.61–1.24)
GFR calc Af Amer: >60 mL/min (ref >60)
GFR calc non Af Amer: >60 mL/min (ref >60)
GLUCOSE: 176 mg/dL — AB (ref 65–99)
POTASSIUM: 4.1 mmol/L (ref 3.5–5.1)
SODIUM: 140 mmol/L (ref 135–145)

## 2016-08-13 LAB — CBC
HCT: 30.8 % — ABNORMAL LOW (ref 39.0–52.0)
HEMOGLOBIN: 9.5 g/dL — AB (ref 13.0–17.0)
MCH: 24.9 pg — AB (ref 26.0–34.0)
MCHC: 30.8 g/dL (ref 30.0–36.0)
MCV: 80.8 fL (ref 78.0–100.0)
PLATELETS: 282 10*3/uL (ref 150–400)
RBC: 3.81 MIL/uL — ABNORMAL LOW (ref 4.22–5.81)
RDW: 15.8 % — ABNORMAL HIGH (ref 11.5–15.5)
WBC: 8.4 10*3/uL (ref 4.0–10.5)

## 2016-08-13 LAB — TYPE AND SCREEN
ABO/RH(D): B POS
ANTIBODY SCREEN: NEGATIVE
Unit division: 0

## 2016-08-13 MED ORDER — METOPROLOL TARTRATE 5 MG/5ML IV SOLN
5.0000 mg | Freq: Once | INTRAVENOUS | Status: DC
  Filled 2016-08-13: qty 5

## 2016-08-13 MED ORDER — METOPROLOL TARTRATE 5 MG/5ML IV SOLN
5.0000 mg | Freq: Once | INTRAVENOUS | Status: AC
  Administered 2016-08-13: 5 mg via INTRAVENOUS

## 2016-08-13 MED ORDER — NYSTATIN 100000 UNIT/GM EX POWD
Freq: Three times a day (TID) | CUTANEOUS | Status: DC
  Administered 2016-08-13 – 2016-08-14 (×3): via TOPICAL
  Filled 2016-08-13: qty 15

## 2016-08-13 NOTE — Consult Note (Addendum)
Consultation  Referring Provider:  Dr. Beryle Beams    Primary Care Physician:  [name], MD Primary Gastroenterologist:   Dr. Hilarie Fredrickson      Reason for Consultation:  Dysphagia       Impression / Plan:   Impression: 1. Dysphagia: Pt complains of symptoms for one day per hx today, apparently since he has been admitted he denies further symptoms of dysphagia; consider relation to acute exacerbation of CHF vs possibly cervical web vs other 2. Acute exacerbation CHF: Hospitalist managing 3. Acute on Chronic Normocytic Anemia: pt has been transfused and hgb has remained stable around 9  Plan: 1. Will hold on EGD for now. If patient displays further episodes of dysphagia, please let us know 2. Will start Dysphagia 3 diet today per speech path recs 3. Continue supportive measures 4. Continue Protonix 97m qd 5. Discussed above with Dr. GCarlean Purl please await any further recs  Thank you for your kind consultation, we will continue to follow.  JLavone NianLemmon  08/13/2016, 8:09 AM Pager #: [phone]   [facility] Attending   I have taken an interval history, reviewed the chart and examined the patient. I agree with the Advanced Practitioner's note, impression and recommendations.   The dysphagia has resolved. 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  Signing off  Call GI back prn Does not need outpt GI f/u for this  I appreciate the opportunity to care for this patient. CGatha Mayer MD, [facility] [phone](pager) [phone] 5 PM, weekends and holidays  08/13/2016 5:41 PM             HPI:   RISAIR INABINETis a 61y.o. male past medical history of abdominal hernia, anemia, anxiety, chronic diastolic CHF, GERD, diabetes, COPD, hyperlipidemia, hypertension, stroke maintained  on Pradaxa, and other past medical history as below who initially presented to the ED on 08/11/16 for a complaint of nausea, vomiting and diarrhea. At that time he described multiple episodes of profuse diarrhea starting 08/10/16 associated with vomiting. He also describes feeling like food got stuck at the end of the stomach/esophagus causing him to vomit. The vomiting was associated with pain in the lower part of his chest and upper abdomen. He also had a mild cough and shortness of breath. He was admitted for what looked like interstitial edema on chest x-ray and elevated BNP in the 400s. He is currently being treated for an acute CHF exacerbation. We were consultated today in regards to patient's dysphagia.   Today, it should be noted that the patient is a very poor historian, he is only able to recall minimal details regarding his hospitalization and his symptoms. He does tell me though that he is "in charge of my own decisions". Apparently he stays with his sister who helps take care of him on a daily basis. Patient describes to me that on Thursday he began to feel very sick with vomiting and diarrhea. He also describes that on that day he felt like "food got stuck on its way down" his throat. Patient describes that since being in the hospital for the past 3 days his symptoms have started to decrease. He describes that he is still having multiple loose stools but this was "bad" before he came in and now is "better". Patient is  unable to quantify for me the amount of stools he was having or how it has changed. Patient also tells me today that since he has been here nothing has felt like it got stuck in his throat. He tells me he was eating potatoes yesterday and did fine. Patient denies any abdominal pain.   Patient denies fever, chills or blood in his stool, melena or continued nausea or vomiting.    Past GI History: 07/09/14-Capsule endoscopy, Dr. Hilarie Fredrickson: Mild gastritis, a few tiny AVMs in the proximal small  bowel and otherwise negative endoscopy 06/10/14-Colonoscopy, Dr. Hilarie Fredrickson: 2 sessile polyps ranging between 3-5 mm in size in the ascending colon and otherwise normal exam 06/10/14-EGD, Dr. Hilarie Fredrickson: Normal esophagus, mild gastritis in the antrum and fundus and duodenal mucosa which showed no abnormalities  Past Medical History:  Diagnosis Date  . Abdominal hernia    Chronic, not a good surgical candidate  . Abscess, abdomen ([place]) 12/31/2010   Referred to Wound Care in 01/2011 because of multiple abd abscess with VERY large ventral hernia (please look at image of CT abd/pelvis 09/2010).  Because of hernia I was hesitant to I&D.      Marland Kitchen Anemia    History of Iron Def Anemia  . Anxiety   . Carotid artery occlusion   . Chronic diastolic CHF (congestive heart failure) (HCC)    takes Lasix  . Chronic low back pain   . COPD (chronic obstructive pulmonary disease) ([place])   . CVA (cerebral infarction) 09/2010   Bilateral with Left > Right  . Diabetes mellitus   . GERD (gastroesophageal reflux disease)   . History of nuclear stress test    Myoview 9/16:  Inferior, apical and inf-lateral ischemia; not gated; High Risk  . Hx of echocardiogram    Echo 5/16:  Mild LVH, EF 55%, indeterm. diast function, WMA could not be ruled out, MAC, trivial MR, mild LAE, normal RVF //  b. Echo 4/17: EF 55-60%, no RWMA, Gr 2 DD, Ao sclerosis, MAC, mild MS, mild LAE, PASP 55 mHg  . Hyperlipidemia   . Hypertension   . Intellectual disability    Sister helps to take care of him and takes him to appts  . Itching    all over body; pt scratches and has sores on bilateral arms and abdomen  . Lung nodule   . Myocardial infarction 2016 ?   Heart attack  (  Per  pt. )  . Obesity   . Oxygen dependent    wears 2 liters at bedtime and when needed  . PAF (paroxysmal atrial fibrillation) ([place]) 06/2009   CHADS score 2 (HTN, DM), was not on coumadin, but now on Pradaxa for Afib  . Pneumonia    hx of  . Shortness of breath dyspnea      with exertion  . Stroke ([place])   . Tobacco user    Smokes 1ppd for multiple years.  Quit after hosp 09/2010.  . Wears glasses     Past Surgical History:  Procedure Laterality Date  . AMPUTATION Right 03/29/2013   Procedure: AMPUTATION RAY;  Surgeon: Newt Minion, MD;  Location: [place];  Service: Orthopedics;  Laterality: Right;  Right Foot 3rd and Possible 4th Ray Amputation  . AMPUTATION Right 04/23/2013   Procedure: AMPUTATION RIGHT MID-FOOT;  Surgeon: Newt Minion, MD;  Location: [place];  Service: Orthopedics;  Laterality: Right;  . arm surgery Left    as a child  . CARDIAC CATHETERIZATION N/A  04/30/2015   Procedure: Left Heart Cath and Coronary Angiography;  Surgeon: Belva Crome, MD;  Location: [facility];  Service: Cardiovascular;  Laterality: N/A;  . Carotid arteriogram  10/2010   30% right ICA stenosis, 40% left ICA stenosis   . CAROTID ENDARTERECTOMY Left 08-13-15   CEA  . CATARACT EXTRACTION, BILATERAL    . COLONOSCOPY WITH PROPOFOL N/A 06/10/2014   Procedure: COLONOSCOPY WITH PROPOFOL;  Surgeon: Jerene Bears, MD;  Location: [facility];  Service: Gastroenterology;  Laterality: N/A;  . CORONARY ARTERY BYPASS GRAFT N/A 08/13/2015   Procedure: CORONARY ARTERY BYPASS GRAFTING (CABG), ON PUMP, TIMES THREE, USING LEFT INTERNAL MAMMARY ARTERY, RIGHT GREATER SAPHENOUS VEIN HARVESTED ENDOSCOPICALLY;  Surgeon: [name], MD;  Location: [place];  Service: Open Heart Surgery;  Laterality: N/A;  . DEBRIDMENT OF DECUBITUS ULCER Right 02/13/2013  . ENDARTERECTOMY Left 08/13/2015   Procedure: ENDARTERECTOMY CAROTID;  Surgeon: Angelia Mould, MD;  Location: [place];  Service: Vascular;  Laterality: Left;  . ESOPHAGOGASTRODUODENOSCOPY (EGD) WITH PROPOFOL N/A 06/10/2014   Procedure: ESOPHAGOGASTRODUODENOSCOPY (EGD) WITH PROPOFOL;  Surgeon: Jerene Bears, MD;  [facility];  Service: Gastroenterology;  Laterality: N/A;  . GIVENS CAPSULE STUDY N/A 07/09/2014   Procedure: GIVENS  CAPSULE STUDY;  Surgeon: Jerene Bears, MD;  Location: [facility];  Service: Gastroenterology;  Laterality: N/A;  . I&D EXTREMITY Right 02/13/2013   Procedure: IRRIGATION AND DEBRIDEMENT FOOT ULCER;  Surgeon: Johnny Bridge, MD;  Location: [facility];  Service: Orthopedics;  Laterality: Right;  PULSE LAVAGE  . MULTIPLE TOOTH EXTRACTIONS    . TEE WITHOUT CARDIOVERSION N/A 08/13/2015   Procedure: TRANSESOPHAGEAL ECHOCARDIOGRAM (TEE);  Surgeon: [name], MD;  Location: [place];  Service: Open Heart Surgery;  Laterality: N/A;  . TRANSESOPHAGEAL ECHOCARDIOGRAM  09/2010   No ASD or PFO. EF 60-65%.  Normal systolic function. No evidence of thrombus.   . TRANSTHORACIC ECHOCARDIOGRAM  09/2010    The cavity size was normal. Systolic function was vigorous.  EF 65-70%.  Normal wall funciton.     Family History  Problem Relation Age of Onset  . Heart disease Mother   . Hypertension Sister   . Heart disease Brother   . Heart disease Father   . Peripheral vascular disease Father   . Heart disease Maternal Grandmother   . Heart attack Maternal Grandmother   . Breast cancer Maternal Grandmother   . Stomach cancer Maternal Uncle   . Colon cancer Neg Hx   . Stroke Neg Hx     Social History  Substance Use Topics  . Smoking status: Former Smoker    Packs/day: 1.00    Years: 42.00    Types: Cigarettes, Pipe    Quit date: 11/10/2010  . Smokeless tobacco: Former Systems developer    Quit date: 10/02/2010  . Alcohol use No     Comment: " last drink was 2003" 08/11/15    Prior to Admission medications   Medication Sig Start Date End Date Taking? Authorizing Provider  acetaminophen (TYLENOL) 325 MG tablet Take 650 mg by mouth 3 (three) times daily as needed for mild pain.   Yes Historical Provider, MD  amLODipine (NORVASC) 5 MG tablet Take 5 mg by mouth daily.   Yes Historical Provider, MD  aspirin EC 81 MG EC tablet Take 1 tablet (81 mg total) by mouth daily. 11/11/12  Yes Kandis Nab, MD  atorvastatin (LIPITOR) 80  MG tablet Take 80 mg by mouth daily.  Yes Historical Provider, MD  calcium carbonate (TUMS - DOSED IN MG ELEMENTAL CALCIUM) 500 MG chewable tablet Chew 1 tablet by mouth 3 (three) times daily as needed for indigestion or heartburn.   Yes Historical Provider, MD  Cholecalciferol (VITAMIN D3) 1000 UNITS CAPS Take 1,000 Units by mouth daily.   Yes Historical Provider, MD  clonazePAM (KLONOPIN) 0.5 MG tablet Take 1 tablet (0.5 mg total) by mouth at bedtime. 11/03/14  Yes Janece Canterbury, MD  dabigatran (PRADAXA) 150 MG CAPS capsule Take 150 mg by mouth 2 (two) times daily.   Yes Historical Provider, MD  ferrous gluconate (FERGON) 324 MG tablet Take 1 tablet (324 mg total) by mouth daily with breakfast. 08/21/15  Yes Donielle Liston Alba, PA-C  fludrocortisone (FLORINEF) 0.1 MG tablet Take 0.1 mg by mouth daily.   Yes Historical Provider, MD  furosemide (LASIX) 40 MG tablet Take 40 mg by mouth daily.   Yes Historical Provider, MD  gabapentin (NEURONTIN) 600 MG tablet Take 600 mg by mouth 2 (two) times daily.   Yes Historical Provider, MD  HYDROcodone-acetaminophen (NORCO) 5-325 MG tablet Take 1 tablet by mouth daily.   Yes Historical Provider, MD  hydrocortisone (ANUSOL-HC) 2.5 % rectal cream Place 1 application rectally 3 (three) times daily as needed for hemorrhoids or itching.   Yes Historical Provider, MD  Insulin Glargine (LANTUS) 100 UNIT/ML Solostar Pen Inject 10 Units into the skin every evening.   Yes Historical Provider, MD  metFORMIN (GLUCOPHAGE) 1000 MG tablet Take 1,000 mg by mouth 2 (two) times daily with a meal.   Yes Historical Provider, MD  metoprolol tartrate (LOPRESSOR) 25 MG tablet Take 0.5 tablets (12.5 mg total) by mouth 2 (two) times daily. 02/19/16  Yes Hosie Poisson, MD  pantoprazole (PROTONIX) 40 MG tablet Take 40 mg by mouth daily.   Yes Historical Provider, MD  PARoxetine (PAXIL) 40 MG tablet Take 40 mg by mouth daily.    Yes Historical Provider, MD  polyvinyl alcohol (LIQUIFILM  TEARS) 1.4 % ophthalmic solution Place 1 drop into both eyes 2 (two) times daily as needed for dry eyes. Natural Tears   Yes Historical Provider, MD    Current Facility-Administered Medications  Medication Dose Route Frequency Provider Last Rate Last Dose  . acetaminophen (TYLENOL) tablet 650 mg  650 mg Oral Q6H PRN Shela Leff, MD       Or  . acetaminophen (TYLENOL) suppository 650 mg  650 mg Rectal Q6H PRN Shela Leff, MD      . amLODipine (NORVASC) tablet 5 mg  5 mg Oral Daily Shela Leff, MD   5 mg at 08/12/16 1043  . atorvastatin (LIPITOR) tablet 80 mg  80 mg Oral Daily Shela Leff, MD   80 mg at 08/12/16 1043  . calcium carbonate (TUMS - dosed in mg elemental calcium) chewable tablet 200 mg of elemental calcium  1 tablet Oral TID PRN Shela Leff, MD      . cholecalciferol (VITAMIN D) tablet 1,000 Units  1,000 Units Oral Daily Shela Leff, MD   1,000 Units at 08/12/16 1043  . clonazePAM (KLONOPIN) tablet 0.5 mg  0.5 mg Oral QHS Shela Leff, MD   0.5 mg at 08/12/16 2123  . ferrous gluconate (FERGON) tablet 324 mg  324 mg Oral Q breakfast Annia Belt, MD      . fludrocortisone (FLORINEF) tablet 0.1 mg  0.1 mg Oral Daily Shela Leff, MD   0.1 mg at 08/12/16 1053  . furosemide (LASIX) injection 40 mg  40 mg Intravenous BID Burgess Estelle, MD   40 mg at 08/12/16 1747  . gabapentin (NEURONTIN) tablet 600 mg  600 mg Oral BID Shela Leff, MD   600 mg at 08/12/16 2123  . HYDROcodone-acetaminophen (NORCO/VICODIN) 5-325 MG per tablet 1 tablet  1 tablet Oral Daily Shela Leff, MD   1 tablet at 08/12/16 1047  . hydrocortisone (ANUSOL-HC) 2.5 % rectal cream 1 application  1 application Rectal TID PRN Shela Leff, MD      . insulin aspart (novoLOG) injection 0-15 Units  0-15 Units Subcutaneous TID WC Shela Leff, MD   3 Units at 08/13/16 0545  . MEDLINE mouth rinse  15 mL Mouth Rinse BID Annia Belt, MD   15 mL at  08/12/16 2123  . metoprolol tartrate (LOPRESSOR) tablet 12.5 mg  12.5 mg Oral BID Shela Leff, MD   12.5 mg at 08/12/16 2123  . ondansetron (ZOFRAN) tablet 4 mg  4 mg Oral Q6H PRN Shela Leff, MD       Or  . ondansetron (ZOFRAN) injection 4 mg  4 mg Intravenous Q6H PRN Shela Leff, MD      . pantoprazole (PROTONIX) EC tablet 40 mg  40 mg Oral Daily Shela Leff, MD   40 mg at 08/12/16 1043  . PARoxetine (PAXIL) tablet 40 mg  40 mg Oral Daily Shela Leff, MD   40 mg at 08/12/16 1047  . polyvinyl alcohol (LIQUIFILM TEARS) 1.4 % ophthalmic solution 1 drop  1 drop Both Eyes BID PRN Shela Leff, MD      . sucralfate (CARAFATE) 1 GM/10ML suspension 1 g  1 g Oral TID WC & HS Burgess Estelle, MD   1 g at 08/13/16 0617    Allergies as of 08/11/2016 - Review Complete 08/11/2016  Allergen Reaction Noted  . Penicillins Hives, Nausea And Vomiting, Swelling, and Other (See Comments) 06/22/2007     Review of Systems:     Constitutional: No weight loss, fever or chills HEENT: Eyes: No change in vision               Ears, Nose, Throat:  No change in hearing  Cardiovascular: No chest pain Respiratory: No SOB Gastrointestinal: See HPI and otherwise negative Genitourinary: No dysuria or change in urinary frequency Neurological: No headache Musculoskeletal: No new muscle or joint pain Hematologic: No bleeding Psychiatric: No history of depression or anxiety   Physical Exam:  Vital signs in last 24 hours: Temp:  [97 F (36.1 C)-98.3 F (36.8 C)] 98 F (36.7 C) (01/06 0610) Pulse Rate:  [58-140] 58 (01/06 0610) Resp:  [18-20] 18 (01/06 0610) BP: (102-165)/(47-85) 131/59 (01/06 0610) SpO2:  [90 %-98 %] 97 % (01/06 0610) Weight:  [184 lb 3.2 oz (83.6 kg)] 184 lb 3.2 oz (83.6 kg) (01/06 0610) Last BM Date: 08/10/16 General:   Pleasant Caucasian male appears to be in NAD, Well developed, Well nourished, alert and cooperative Head:  Normocephalic and atraumatic. Eyes:    PEERL, EOMI. No icterus. Conjunctiva pink. Ears:  Normal auditory acuity. Neck:  Supple Throat: Oral cavity and pharynx without inflammation, swelling or lesion.  Lungs: Respirations even and unlabored. Lungs clear to auscultation bilaterally.   No wheezes, crackles, or rhonchi.  Heart: Normal S1, S2. No MRG. Regular rate and rhythm. No peripheral edema, cyanosis or pallor.  Abdomen:  Obese, Soft, nondistended, nontender. No rebound or guarding. Normal bowel sounds. No appreciable masses or hepatomegaly. Rectal:  Not performed.  Msk:  Symmetrical without gross deformities.  Extremities:  Without edema, no deformity or joint abnormality. Neurologic:  Alert and  oriented x4;  grossly normal neurologically.  Skin:   Dry and intact without significant lesions or rashes. Psychiatric: Demonstrates good judgement and reason without abnormal affect or behaviors.Memory impairment, cognitive defecits   LAB RESULTS:  Recent Labs  08/12/16 0351 08/12/16 1215 08/13/16 0528  WBC 7.3 6.8 8.4  HGB 7.6* 9.0* 9.5*  HCT 24.9* 28.5* 30.8*  PLT 286 276 282   BMET  Recent Labs  08/11/16 1750 08/12/16 0351 08/13/16 0528  NA 139 140 140  K 4.1 4.0 4.1  CL 104 102 102  CO2 27 30 29   GLUCOSE 153* 155* 176*  BUN 31* 26* 24*  CREATININE 1.00 0.91 0.91  CALCIUM 8.8* 8.8* 8.8*    STUDIES: Esophagus images viewed Gatha Mayer, MD, [facility]  Dg Chest 2 View  Result Date: 08/11/2016 CLINICAL DATA:  Central chest pain and dyspnea x2 days. EXAM: CHEST  2 VIEW COMPARISON:  02/13/2016 FINDINGS: Heart is top-normal in size. Stable aortic atherosclerosis. The patient is status post CABG. Trace right pleural effusion. Mild interstitial prominence consistent with interstitial edema. No acute osseous abnormality. IMPRESSION: Status post CABG. Mild diffuse interstitial prominence consistent with interstitial edema with small right pleural effusion. Electronically Signed   By: Ashley Royalty M.D.   On: 08/11/2016  18:18   Dg Esophagus  Result Date: 08/12/2016 CLINICAL DATA:  61 year old inpatient male with sensation of food sticking in the mid to lower chest yesterday. Initial encounter. EXAM: ESOPHOGRAM/BARIUM SWALLOW TECHNIQUE: Single contrast examination was performed using  barium. FLUOROSCOPY TIME:  Fluoroscopy Time:  0 minutes 48 seconds Radiation Exposure Index (if provided by the fluoroscopic device): Number of Acquired Spot Images: 0 COMPARISON:  Chest radiographs 08/11/2016 and earlier. Chest CT 11/26/2010. FINDINGS: An upright single contrast study was undertaken and the patient tolerated this well and without difficulty. No obstruction to the forward flow of contrast throughout the esophagus and into the stomach. Normal esophageal course and contour. Tertiary contractions occurred. A 12.5 mm barium tablet was administered and passed freely to the stomach without delay. Cervical esophageal imaging is remarkable for a small ventral web at the lower cervical esophagus level (C6). Also, penetration of barium to the level of the vocal cords occurred. The patient did cough intermittently during the study. No frank aspiration was observed. IMPRESSION: 1. No esophageal obstruction or stricture.  Presbyesophagus. 2. Penetration of barium to the level of the vocal cords occurred during the exam and did elicit intermittent coughing. Recommend followup speech pathology evaluation to evaluate for aspiration risk. Electronically Signed   By: Genevie Ann M.D.   On: 08/12/2016 13:59     PREVIOUS ENDOSCOPIES:            See HPI

## 2016-08-13 NOTE — Progress Notes (Signed)
   Subjective: [name] was seen and evaluated today at bedside. He has no acute complaints. Reports his breathing is better since admission. Feels that we are getting fluid off of him. He had a short run of AFIB with RVR overnight which converted to SNR with metop 7.5mg .   Objective:  Vital signs in last 24 hours: Vitals:   08/13/16 0215 08/13/16 0325 08/13/16 0610 08/13/16 0951  BP: 104/73 (!) 115/57 (!) 131/59 140/64  Pulse:  65 (!) 58 84  Resp:   18   Temp:   98 F (36.7 C)   TempSrc:   Oral   SpO2:   97%   Weight:   184 lb 3.2 oz (83.6 kg)   Height:       General: Chronically-ill appearing caucasian male resting comfortably in bed. In no acute distress. HENT: EOMI. No conjunctival injection or icterus. Oropharynx clear.  Cardiovascular: NSR. No murmur or rub appreciated. Pulmonary: Rales appreciated to mid-lung BL, improved. Breathing unlabored. Abdomen: Soft, non-tender. Very large ventral hernia apparent. No guarding or rigidity. +bowel sounds.  Skin: Warm, dry. No cyanosis. Pale  Assessment/Plan:  Active Problems:   Acute exacerbation of CHF (congestive heart failure) (HCC)   Acute on chronic combined systolic and diastolic congestive heart failure (HCC)   Dysphagia  Acute on Chronic Combined CHF, Ischemic Cardiomyopathy Improving. Down 3 lbs since admission, unclear dry weight.  Suspect that due to patients dysphagia he has been increasing his fluid intake and this combined with his CHF + intermittent medication non-compliance was the driving force for his current exacerbation.  -Continue diuresis with IV Lasix 40 mg BID and will continue to monitor his response -Renal fxn stable and will monitor with diuresis -Pt and sister who is his primary care-taker will need education on CHF dietary management, including sodium and fluid restriction -I&O + daily weights  Dysphagia  Acute onset dysphagia - described as feeling like food gets stuck in the bottom of his chest  and throws up about 1 minute after eating. Barium swallow showed presbyesophagus (dysmotility associated with advanced age) and a small anterior cervical web. Was seen by GI who felt that his dysphagia was due to his [facility] CHF/volume overload and have started his dysphagia 3 diet, recommended by SLP. Their plan appears to be to monitor patients response to diet and if he complains of dysphagia to proceed with EGD. They report pt denied any dysphagia during admission, however he has had a mostly liquid diet since admission and he has sold dysphagia.  -Follow results of trial diet and will f/u GI recs -Protonix 40 mg daily  Acute on Chronic Anemia Pt endorses intermittent occasional melena. BUN elevated, suggesting an UGI bleed. EGD + capsule endoscopy in 2015 revealed gastritis + non-bleeding AVMs. These certainly could be the cause of pts anemia as he denied any other sources of blood loss. He also was not on PPI therapy with hx of gastritis.  -Hb 9.5 today. Will follow with repeat CBC tomorrow.  PAF Had short course of Afib with RVR overnight while sleeping. Reportedly completely asymptomatic. Converted to NSR with Metop 7.5. Holding Pradexa and ASA in setting of anemia and ?GI bleed. Was in NSR on examination.  HTN Continue home regimen of Amlodipine 5 mg and metoprolol 12.5 mg BID.   MDD/Anxiety/Hypoaldosteronism: Continuing home Paxil, clonazepam and florinef  Dispo: Anticipated discharge in approximately 1-2 day(s).   Joseph Philbert, DO 08/13/2016, 11:16 AM Pager: [phone]

## 2016-08-13 NOTE — Progress Notes (Signed)
Medicine attending: I personally examined this patient today together with resident physician Dr. Einar Gip and I concur with her evaluation and management plan which we discussed together and which will be further detailed in her progress note to follow. 61 year old man admitted on January 4 for treatment of an acute exacerbation of chronic heart failure. He has a number of other active issues. He has a massive abdominal hernia. He has developed unexplained dysphagia and a sensation that food is getting stuck and not passing through his GI tract. Cardiac status currently stable and attention now directed towards further evaluation of his GI symptoms. Of note he had a previous extensive GI evaluation 2 years ago including EGD, colonoscopy, and capsule endoscopy. No major pathology detected at that time. Barium swallow done yesterday January 5 showed no esophageal obstruction or stricture. Changes consistent with presbyesophagus. Review by our speech and language pathologist felt study consistent with primary esophageal dysphagia and recommended a dysphagia 3 diet. He was evaluated earlier this morning by gastroenterology. Since his symptoms seem to be improving, they recommended conservative management. Recommendation to start a dysphagia 3 diet today. Continue protonix. Additional findings on exam today include extensive intertrigo. We will begin antifungal therapy. Follow-up EKG today with sinus bradycardia and chronic changes right bundle branch block, left anterior fascicular block, changes from previous MI, no acute changes.

## 2016-08-14 DIAGNOSIS — I5043 Acute on chronic combined systolic (congestive) and diastolic (congestive) heart failure: Principal | ICD-10-CM

## 2016-08-14 DIAGNOSIS — D62 Acute posthemorrhagic anemia: Secondary | ICD-10-CM

## 2016-08-14 DIAGNOSIS — Z880 Allergy status to penicillin: Secondary | ICD-10-CM

## 2016-08-14 LAB — BASIC METABOLIC PANEL
Anion gap: 8 (ref 5–15)
BUN: 26 mg/dL — AB (ref 6–20)
CO2: 31 mmol/L (ref 22–32)
CREATININE: 0.88 mg/dL (ref 0.61–1.24)
Calcium: 8.9 mg/dL (ref 8.9–10.3)
Chloride: 100 mmol/L — ABNORMAL LOW (ref 101–111)
GFR calc Af Amer: >60 mL/min (ref >60)
GFR calc non Af Amer: >60 mL/min (ref >60)
Glucose, Bld: 183 mg/dL — ABNORMAL HIGH (ref 65–99)
POTASSIUM: 4.7 mmol/L (ref 3.5–5.1)
SODIUM: 139 mmol/L (ref 135–145)

## 2016-08-14 LAB — GLUCOSE, CAPILLARY
GLUCOSE-CAPILLARY: 294 mg/dL — AB (ref 65–99)
GLUCOSE-CAPILLARY: 180 mg/dL — AB (ref 65–99)

## 2016-08-14 MED ORDER — NYSTATIN 100000 UNIT/GM EX POWD: Freq: Three times a day (TID) | CUTANEOUS | 2 refills | Status: DC

## 2016-08-14 MED ORDER — SUCRALFATE 1 GM/10ML PO SUSP: 1.0000 g | Freq: Three times a day (TID) | ORAL | 0 refills | Status: DC

## 2016-08-14 NOTE — Discharge Summary (Signed)
Name: [name] MRN: [id number] DOB: 02-Apr-1956 61 y.o. PCP: Joseph Adie, MD  Date of Admission: 08/11/2016  5:05 PM Date of Discharge: 08/14/2016 Attending Physician: Joseph Belt, MD  Discharge Diagnosis: 1. Acute exacerbation of CHF, symptomatic anemia , dysphagia  Active Problems:   Acute exacerbation of CHF (congestive heart failure) (HCC)   Acute on chronic combined systolic and diastolic congestive heart failure (HCC)   Dysphagia   Intertrigo   Right bundle branch block (RBBB) on electrocardiography   Discharge Medications: Allergies as of 08/14/2016      Reactions   Penicillins Hives, Nausea And Vomiting, Swelling, Other (See Comments)   Has patient had a PCN reaction causing immediate rash, facial/tongue/throat swelling, SOB or lightheadedness with hypotension: YES Has patient had a PCN reaction causing severe rash involving mucus membranes or skin necrosis: No Has patient had a PCN reaction that required hospitalization No Has patient had a PCN reaction occurring within the last 10 years: No If all of the above answers are "NO", then may proceed with Cephalosporin use.      Medication List    STOP taking these medications   aspirin 81 MG EC tablet   dabigatran 150 MG Caps capsule Commonly known as:  PRADAXA     TAKE these medications   acetaminophen 325 MG tablet Commonly known as:  TYLENOL Take 650 mg by mouth 3 (three) times daily as needed for mild pain.   amLODipine 5 MG tablet Commonly known as:  NORVASC Take 5 mg by mouth daily.   atorvastatin 80 MG tablet Commonly known as:  LIPITOR Take 80 mg by mouth daily.   calcium carbonate 500 MG chewable tablet Commonly known as:  TUMS - dosed in mg elemental calcium Chew 1 tablet by mouth 3 (three) times daily as needed for indigestion or heartburn.   clonazePAM 0.5 MG tablet Commonly known as:  KLONOPIN Take 1 tablet (0.5 mg total) by mouth at bedtime.   ferrous gluconate 324 MG  tablet Commonly known as:  FERGON Take 1 tablet (324 mg total) by mouth daily with breakfast.   fludrocortisone 0.1 MG tablet Commonly known as:  FLORINEF Take 0.1 mg by mouth daily.   furosemide 40 MG tablet Commonly known as:  LASIX Take 40 mg by mouth daily.   gabapentin 600 MG tablet Commonly known as:  NEURONTIN Take 600 mg by mouth 2 (two) times daily.   hydrocortisone 2.5 % rectal cream Commonly known as:  ANUSOL-HC Place 1 application rectally 3 (three) times daily as needed for hemorrhoids or itching.   Insulin Glargine 100 UNIT/ML Solostar Pen Commonly known as:  LANTUS Inject 10 Units into the skin every evening.   metFORMIN 1000 MG tablet Commonly known as:  GLUCOPHAGE Take 1,000 mg by mouth 2 (two) times daily with a meal.   metoprolol tartrate 25 MG tablet Commonly known as:  LOPRESSOR Take 0.5 tablets (12.5 mg total) by mouth 2 (two) times daily.   NORCO 5-325 MG tablet Generic drug:  HYDROcodone-acetaminophen Take 1 tablet by mouth daily.   nystatin powder Commonly known as:  MYCOSTATIN/NYSTOP Apply topically 3 (three) times daily.   pantoprazole 40 MG tablet Commonly known as:  PROTONIX Take 40 mg by mouth daily.   PARoxetine 40 MG tablet Commonly known as:  PAXIL Take 40 mg by mouth daily.   polyvinyl alcohol 1.4 % ophthalmic solution Commonly known as:  LIQUIFILM TEARS Place 1 drop into both eyes 2 (two) times daily as  needed for dry eyes. Natural Tears   sucralfate 1 GM/10ML suspension Commonly known as:  CARAFATE Take 10 mLs (1 g total) by mouth 4 (four) times daily -  with meals and at bedtime.   Vitamin D3 1000 units Caps Take 1,000 Units by mouth daily.       Disposition and follow-up:   Mr.Ishmail L [name] was discharged from [facility] in Stable condition.  At the hospital follow up visit please address:  1.  AFib: recommend resuming pradaxa and ASA. THey were temporarily held due to the anemia.  Dysphagia:  has it resolved? May need GI follow up if it persists  Intertrigo- check to make sure it has not progressed  Combined CHF- needs daily weights and adherence to diuretic therapy   2.  Labs / imaging needed at time of follow-up: recheck CBC and BMET  3.  Pending labs/ test needing follow-up:   Follow-up Appointments: Follow-up Information    Joseph Maroon, MD. Schedule an appointment as soon as possible for a visit.   Specialty:  Family Medicine Contact information: [place] [address] [facility] Course by problem list: Active Problems:   Acute exacerbation of CHF (congestive heart failure) (HCC)   Acute on chronic combined systolic and diastolic congestive heart failure (HCC)   Dysphagia   Intertrigo   Right bundle branch block (RBBB) on electrocardiography   Acute on Chronic Combined CHF, Ischemic Cardiomyopathy:  He was diuresed with IV lasix and was net negative 7 L. Admission weight was about 195 pounds and discharge weight was 187 pounds. However, thehospital weights are somewhat unreliable. He said his dyspnea improved and was stable on discharge.    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 Was seen by GI who felt that his dysphagia was due to his [facility] CHF/volume overload and have started his dysphagia 3 diet, recommended by SLP. As his dysphagia seems to be improving, an EGD was held off and he was restarted on soft diet which he tolerated well without any additional complaints of dysphagia. He has had extensive GI workup in the past also.  PAF . Holding Pradexa and ASA in setting of anemia. Both were held on discharge, and it si recommended that he discuss with his primary doctor on when to resume them. His stroke risk is higher than bleeding risk so it is recommended to resume them.    HTN We Continued home regimen of Amlodipine 5  mg and metoprolol 12.5 mg BID.   Intertrigo- He was noted to have intertrigo under his pannus. He was started on nystatin powder   Anemia- s/p 1 unit PRBC. Borderline microcytic- Ferritin of 160. Perhaps thalassemia ? 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   Discharge Instructions: Discharge Instructions    (HEART FAILURE PATIENTS) Call MD:  Anytime you have any  of the following symptoms: 1) 3 pound weight gain in 24 hours or 5 pounds in 1 week 2) shortness of breath, with or without a dry hacking cough 3) swelling in the hands, feet or stomach 4) if you have to sleep on extra pillows at night in order to breathe.    Complete by:  As directed    Call MD for:  persistant dizziness or light-headedness    Complete by:  As directed    Diet - low sodium heart healthy    Complete by:  As directed    Increase activity slowly    Complete by:  As directed       Signed: Burgess Estelle, MD 08/14/2016, 11:41 AM

## 2016-08-14 NOTE — Progress Notes (Signed)
   Subjective:   No acute events overnight. He is feeling well. He ate his solid breakfast without any difficulty. Denies nausea or vomiting. Denies dysphagia. Denies odynophagia.  Feels that he is ready to go home.   Objective:  Vital signs in last 24 hours: Vitals:   08/13/16 1153 08/13/16 2103 08/14/16 0233 08/14/16 0450  BP: (!) 104/47 (!) 132/54  (!) 152/57  Pulse: (!) 58 (!) 56  (!) 59  Resp: 18 20  20   Temp: 98.4 F (36.9 C) 98.3 F (36.8 C)  97.6 F (36.4 C)  TempSrc: Oral Oral  Oral  SpO2: 90% 92%  92%  Weight:   187 lb 8 oz (85 kg)   Height:       General: Chronically-ill appearing caucasian male resting comfortably in bed. In no acute distress. Cardiovascular: NSR. No murmur or rub appreciated. Pulmonary: CTAB, no wheezing Abdomen: Soft, non-tender. Very large ventral hernia apparent. No guarding or rigidity. +bowel sounds.  Skin: Warm, dry. No cyanosis. Pale  Assessment/Plan:  Active Problems:   Acute exacerbation of CHF (congestive heart failure) (HCC)   Acute on chronic combined systolic and diastolic congestive heart failure (HCC)   Dysphagia   Intertrigo   Right bundle branch block (RBBB) on electrocardiography  Acute on Chronic Combined CHF, Ischemic Cardiomyopathy: Improving. Down 3 lbs since admission, unclear dry weight.  Suspect that due to patients dysphagia he has been increasing his fluid intake and this combined with his CHF + intermittent medication non-compliance was the driving force for his current exacerbation. He diuresed well.    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 Was seen by GI who felt that his dysphagia was due to his [facility] CHF/volume overload and have started his dysphagia 3 diet, recommended by SLP. Their plan appears to be to monitor patients response to diet and if he complains of dysphagia to proceed with EGD. Patient has not  had any episodes of dysphagia and he was able to tolerate his food this morning  -discharge home today   PAF . Holding Pradexa and ASA in setting of anemia and ?GI bleed.  -Was in NSR on examination.  HTN Continue home regimen of Amlodipine 5 mg and metoprolol 12.5 mg BID.   Intertrigo- -on nystatin powder   Anemia- s/p 1 unit PRBC. Borderline microcytic- Ferritin of 160. Perhaps thalassemia ?  MDD/Anxiety/Hypoaldosteronism: Continuing home Paxil, clonazepam and florinef  Dispo: Anticipated discharge in approximately today  Burgess Estelle, MD 08/14/2016, 11:11 AM

## 2016-08-14 NOTE — Progress Notes (Addendum)
Patient alert and oriented, no shortness of breath, denies pain. VSS. Iv and tele d/c, d/c instruction explain given to the patient and sister, both verbalized understanding, all questions answered. Patient d/c home per order

## 2016-08-14 NOTE — Discharge Instructions (Signed)
We have temporarily stopped the aspirin and the pradaxa which are blood thinner.  Please follow up with your primary doctor- Please check your hemoglobin (blood)- if your blood counts look ok, then you can discuss with your primary doctor about restarting the aspirin and the pradaxa  Please watch your salt intake, as you have some heart failure.  Please inform your primary doctor if you continue to have trouble swallowing- perhaps they can arrange for a scope in the GI doctor's office- While in the hospital, a GI doctor saw you and did not feel a scope was necessary as you have had that workup done and you did not have any trouble swallowing regular diet.     Heart Failure Heart failure means your heart has trouble pumping blood. This makes it hard for your body to work well. Heart failure is usually a long-term (chronic) condition. You must take good care of yourself and follow your doctor's treatment plan. HOME CARE  Take your heart medicine as told by your doctor.  Do not stop taking medicine unless your doctor tells you to.  Do not skip any dose of medicine.  Refill your medicines before they run out.  Take other medicines only as told by your doctor or pharmacist.  Stay active if told by your doctor. The elderly and people with severe heart failure should talk with a doctor about physical activity.  Eat heart-healthy foods. Choose foods that are without trans fat and are low in saturated fat, cholesterol, and salt (sodium). This includes fresh or frozen fruits and vegetables, fish, lean meats, fat-free or low-fat dairy foods, whole grains, and high-fiber foods. Lentils and dried peas and beans (legumes) are also good choices.  Limit salt if told by your doctor.  Cook in a healthy way. Roast, grill, broil, bake, poach, steam, or stir-fry foods.  Limit fluids as told by your doctor.  Weigh yourself every morning. Do this after you pee (urinate) and before you eat breakfast. Write  down your weight to give to your doctor.  Take your blood pressure and write it down if your doctor tells you to.  Ask your doctor how to check your pulse. Check your pulse as told.  Lose weight if told by your doctor.  Stop smoking or chewing tobacco. Do not use gum or patches that help you quit without your doctor's approval.  Schedule and go to doctor visits as told.  Nonpregnant women should have no more than 1 drink a day. Men should have no more than 2 drinks a day. Talk to your doctor about drinking alcohol.  Stop illegal drug use.  Stay current with shots (immunizations).  Manage your health conditions as told by your doctor.  Learn to manage your stress.  Rest when you are tired.  If it is really hot outside:  Avoid intense activities.  Use air conditioning or fans, or get in a cooler place.  Avoid caffeine and alcohol.  Wear loose-fitting, lightweight, and light-colored clothing.  If it is really cold outside:  Avoid intense activities.  Layer your clothing.  Wear mittens or gloves, a hat, and a scarf when going outside.  Avoid alcohol.  Learn about heart failure and get support as needed.  Get help to maintain or improve your quality of life and your ability to care for yourself as needed. GET HELP IF:   You gain weight quickly.  You are more short of breath than usual.  You cannot do your normal activities.  You tire easily.  You cough more than normal, especially with activity.  You have any or more puffiness (swelling) in areas such as your hands, feet, ankles, or belly (abdomen).  You cannot sleep because it is hard to breathe.  You feel like your heart is beating fast (palpitations).  You get dizzy or light-headed when you stand up. GET HELP RIGHT AWAY IF:   You have trouble breathing.  There is a change in mental status, such as becoming less alert or not being able to focus.  You have chest pain or discomfort.  You  faint. MAKE SURE YOU:   Understand these instructions.  Will watch your condition.  Will get help right away if you are not doing well or get worse. This information is not intended to replace advice given to you by your health care provider. Make sure you discuss any questions you have with your health care provider. Document Released: 05/03/2008 Document Revised: 08/15/2014 Document Reviewed: 09/10/2012 Elsevier Interactive Patient Education  2017 [employer].

## 2016-08-31 ENCOUNTER — Other Ambulatory Visit: Payer: Self-pay | Admitting: Family Medicine

## 2016-08-31 ENCOUNTER — Ambulatory Visit
Admission: RE | Admit: 2016-08-31 | Discharge: 2016-08-31 | Disposition: A | Discharge status: Home / Self Care | Payer: Medicare (Managed Care) | Source: Ambulatory Visit | Attending: Family Medicine | Admitting: Family Medicine

## 2016-08-31 DIAGNOSIS — R0609 Other forms of dyspnea: Principal | ICD-10-CM

## 2016-09-22 ENCOUNTER — Other Ambulatory Visit (HOSPITAL_COMMUNITY): Payer: Self-pay | Admitting: *Deleted

## 2016-09-23 ENCOUNTER — Ambulatory Visit (HOSPITAL_COMMUNITY): Admission: RE | Admit: 2016-09-23 | Payer: Medicare (Managed Care) | Source: Ambulatory Visit

## 2016-09-23 ENCOUNTER — Encounter: Payer: Self-pay | Admitting: Cardiology

## 2016-09-29 ENCOUNTER — Encounter: Payer: Self-pay | Admitting: Physician Assistant

## 2016-09-29 ENCOUNTER — Other Ambulatory Visit (HOSPITAL_COMMUNITY): Payer: Self-pay | Admitting: *Deleted

## 2016-09-30 ENCOUNTER — Ambulatory Visit (HOSPITAL_COMMUNITY)
Admission: RE | Admit: 2016-09-30 | Discharge: 2016-09-30 | Disposition: A | Discharge status: Home / Self Care | Payer: Medicare (Managed Care) | Source: Ambulatory Visit | Attending: Family Medicine | Admitting: Family Medicine

## 2016-09-30 DIAGNOSIS — D6489 Other specified anemias: Secondary | ICD-10-CM | POA: Insufficient documentation

## 2016-09-30 MED ORDER — SODIUM CHLORIDE 0.9 % IV SOLN
510.0000 mg | INTRAVENOUS | Status: DC
  Administered 2016-09-30: 10:00:00 510 mg via INTRAVENOUS
  Filled 2016-09-30: qty 17

## 2016-10-03 ENCOUNTER — Encounter: Payer: Self-pay | Admitting: *Deleted

## 2016-10-05 ENCOUNTER — Ambulatory Visit (INDEPENDENT_AMBULATORY_CARE_PROVIDER_SITE_OTHER): Payer: Medicare (Managed Care) | Admitting: Physician Assistant

## 2016-10-05 ENCOUNTER — Encounter: Payer: Self-pay | Admitting: Physician Assistant

## 2016-10-05 ENCOUNTER — Other Ambulatory Visit (INDEPENDENT_AMBULATORY_CARE_PROVIDER_SITE_OTHER): Payer: Medicare (Managed Care)

## 2016-10-05 VITALS — BP 110/60 | HR 68 | Ht 60.0 in | Wt 208.1 lb

## 2016-10-05 DIAGNOSIS — Z8774 Personal history of (corrected) congenital malformations of heart and circulatory system: Secondary | ICD-10-CM

## 2016-10-05 DIAGNOSIS — Z86010 Personal history of colonic polyps: Secondary | ICD-10-CM

## 2016-10-05 DIAGNOSIS — D649 Anemia, unspecified: Secondary | ICD-10-CM

## 2016-10-05 LAB — CBC WITH DIFFERENTIAL/PLATELET
BASOS PCT: 1.3 % (ref 0.0–3.0)
Basophils Absolute: 0.1 10*3/uL (ref 0.0–0.1)
EOS ABS: 0.3 10*3/uL (ref 0.0–0.7)
EOS PCT: 3.2 % (ref 0.0–5.0)
HCT: 27 % — ABNORMAL LOW (ref 39.0–52.0)
Hemoglobin: 8.5 g/dL — ABNORMAL LOW (ref 13.0–17.0)
Lymphocytes Relative: 16.3 % (ref 12.0–46.0)
Lymphs Abs: 1.6 10*3/uL (ref 0.7–4.0)
MCHC: 31.3 g/dL (ref 30.0–36.0)
MCV: 78.8 fl (ref 78.0–100.0)
MONO ABS: 0.6 10*3/uL (ref 0.1–1.0)
Monocytes Relative: 6.4 % (ref 3.0–12.0)
NEUTROS PCT: 72.8 % (ref 43.0–77.0)
Neutro Abs: 7 10*3/uL (ref 1.4–7.7)
Platelets: 378 10*3/uL (ref 150.0–400.0)
RBC: 3.43 Mil/uL — AB (ref 4.22–5.81)
RDW: 19.6 % — ABNORMAL HIGH (ref 11.5–15.5)
WBC: 9.6 10*3/uL (ref 4.0–10.5)

## 2016-10-05 NOTE — Progress Notes (Addendum)
Subjective:    Patient ID: [name], male    DOB: 11-18-1955, 61 y.o.   MRN: [id number]  HPI [name] is a 61 year old white male, known to Dr. Hilarie Fredrickson, referred today by [facility]/Dr. Barney Drain for evaluation of anemia felt secondary to chronic blood loss. Patient was last seen in our office in 2015 and underwent workup in November 2015 for iron deficiency anemia. He underwent EGD which showed mild gastritis, and colonoscopy revealing 2 small sessile polyps which were removed and found to be tubular adenomas. 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 Patient was hospitalized in January 2018 with symptomatic anemia, exacerbation of congestive heart failure and also had complaints of dysphagia. He was seen by GI/Dr. Carlean Purl during that admission and had barium swallow done consistent with presbyesophagus. He had no evidence of active GI bleeding and no further GI workup was recommended. He is referred again today because of the anemia. In January 2018 his hemoglobin was in the 7.6 range. Receive a transfusion Y was hospitalized in January and has just started on  Fereheme. On 08/12/2016 was 160. Reviewing prior labs hemoglobin in July 2017 was 9.9 in October 2000 1611.9 and in June 2015 hemoglobin was 9.2.  Patient has multiple other comorbidities and is on chronic antiplatelet therapy with Protonix and aspirin. He has history of carotid stenosis, coronary artery disease, congestive heart failure with EF of 45-50%, atrial fibrillation, prior CVA and COPD. He has no current complaints of dysphagia or odynophagia, no complaints of abdominal pain he says he does have loose stools off and on has not noted any melena or hematochezia. Last labs January 2018.  Review of Systems Pertinent positive and negative review of systems were noted in the above HPI section.  All other review of systems was otherwise negative.  Outpatient Encounter  Prescriptions as of 10/05/2016  Medication Sig  . acetaminophen (TYLENOL) 325 MG tablet Take 650 mg by mouth 3 (three) times daily as needed for mild pain.  Marland Kitchen amLODipine (NORVASC) 5 MG tablet Take 5 mg by mouth daily.  Marland Kitchen atorvastatin (LIPITOR) 80 MG tablet Take 80 mg by mouth daily.  . calcium carbonate (TUMS - DOSED IN MG ELEMENTAL CALCIUM) 500 MG chewable tablet Chew 1 tablet by mouth 3 (three) times daily as needed for indigestion or heartburn.  . Cholecalciferol (VITAMIN D3) 1000 UNITS CAPS Take 1,000 Units by mouth daily.  . clonazePAM (KLONOPIN) 0.5 MG tablet Take 1 tablet (0.5 mg total) by mouth at bedtime.  . fludrocortisone (FLORINEF) 0.1 MG tablet Take 0.1 mg by mouth daily.  . furosemide (LASIX) 40 MG tablet Take 40 mg by mouth daily.  Marland Kitchen gabapentin (NEURONTIN) 600 MG tablet Take 600 mg by mouth 2 (two) times daily.  Marland Kitchen HYDROcodone-acetaminophen (NORCO) 5-325 MG tablet Take 1 tablet by mouth daily.  . hydrocortisone (ANUSOL-HC) 2.5 % rectal cream Place 1 application rectally 3 (three) times daily as needed for hemorrhoids or itching.  . Insulin Glargine (LANTUS) 100 UNIT/ML Solostar Pen Inject 10 Units into the skin every evening.  Marland Kitchen ipratropium-albuterol (DUONEB) 0.5-2.5 (3) MG/3ML SOLN Take 3 mLs by nebulization 2 (two) times daily.  . metFORMIN (GLUCOPHAGE) 1000 MG tablet Take 1,000 mg by mouth 2 (two) times daily with a meal.  . metolazone (ZAROXOLYN) 2.5 MG tablet Take 2.5 mg by mouth daily.  . metoprolol tartrate (LOPRESSOR) 25 MG tablet Take 0.5 tablets (12.5 mg total) by mouth 2 (  two) times daily.  . nitroGLYCERIN (NITROSTAT) 0.4 MG SL tablet Place 0.4 mg under the tongue every 5 (five) minutes as needed for chest pain.  Marland Kitchen nystatin (MYCOSTATIN/NYSTOP) powder Apply topically 3 (three) times daily.  . pantoprazole (PROTONIX) 40 MG tablet Take 40 mg by mouth daily.  Marland Kitchen PARoxetine (PAXIL) 40 MG tablet Take 40 mg by mouth daily.   . polyvinyl alcohol (LIQUIFILM TEARS) 1.4 % ophthalmic  solution Place 1 drop into both eyes 2 (two) times daily as needed for dry eyes. Natural Tears  . sucralfate (CARAFATE) 1 GM/10ML suspension Take 10 mLs (1 g total) by mouth 4 (four) times daily -  with meals and at bedtime.  . [DISCONTINUED] ferrous gluconate (FERGON) 324 MG tablet Take 1 tablet (324 mg total) by mouth daily with breakfast.   No facility-administered encounter medications on file as of 10/05/2016.    Allergies  Allergen Reactions  . Penicillins Hives, Nausea And Vomiting, Swelling and Other (See Comments)    Has patient had a PCN reaction causing immediate rash, facial/tongue/throat swelling, SOB or lightheadedness with hypotension: YES Has patient had a PCN reaction causing severe rash involving mucus membranes or skin necrosis: No Has patient had a PCN reaction that required hospitalization No Has patient had a PCN reaction occurring within the last 10 years: No If all of the above answers are "NO", then may proceed with Cephalosporin use.    Patient Active Problem List   Diagnosis Date Noted  . Intertrigo   . Right bundle branch block (RBBB) on electrocardiography   . Acute on chronic combined systolic and diastolic congestive heart failure ([place])   . Dysphagia   . Acute exacerbation of CHF (congestive heart failure) ([place]) 08/11/2016  . CAP (community acquired pneumonia) 02/14/2016  . Nausea and vomiting 02/14/2016  . Diabetes mellitus type 2 in obese ([place]) 02/14/2016  . Elevated troponin 02/14/2016  . Sepsis ([place]) 02/13/2016  . Ischemic cardiomyopathy 09/17/2015  . PAF (paroxysmal atrial fibrillation) ([place]) 09/17/2015  . CAD (coronary artery disease) 07/14/2015  . Chronic combined systolic and diastolic CHF (congestive heart failure) ([place]) 07/14/2015  . Abrasion of abdominal wall 10/26/2014  . Abdominal hernia 10/26/2014  . AKI (acute kidney injury) ([place])   . Gastritis and gastroduodenitis 06/10/2014  . Benign neoplasm of ascending colon 06/10/2014  . Type 2  diabetes mellitus with right diabetic foot ulcer ([place]) 02/14/2013  . Wound, open, foot 12/13/2012  . IDA (iron deficiency anemia) 12/13/2012  . Hyperkalemia 05/22/2012  . Diabetic leg ulcer ([place]) 05/15/2012  . Routine health maintenance 05/15/2012  . Leg weakness, bilateral 12/10/2011  . Carotid stenosis 08/31/2011  . Sciatica 05/22/2011  . Ventral hernia without obstruction or gangrene 01/24/2011  . Incidental lung nodule, greater than or equal to 65mm 11/24/2010  . CVA (cerebral vascular accident) ([place]) 10/24/2010  . Anxiety state 06/22/2007  . DM (diabetes mellitus), type 2, uncontrolled ([place]) 12/22/2006  . Essential hypertension 12/22/2006  . HYPERCHOLESTEROLEMIA 10/05/2006  . TOBACCO DEPENDENCE 10/05/2006  . Unspecified intellectual disabilities 10/05/2006  . COPD 10/05/2006   Social History   Social History  . Marital status: Single    Spouse name: N/A  . Number of children: 0  . Years of education: N/A   Occupational History  .  Disabled   Social History Main Topics  . Smoking status: Former Smoker    Packs/day: 1.00    Years: 42.00    Types: Cigarettes, Pipe    Quit date: 11/10/2010  . Smokeless tobacco:  Former Systems developer    Quit date: 10/02/2010  . Alcohol use No     Comment: " last drink was 2003" 08/11/15  . Drug use: No  . Sexual activity: Not on file   Other Topics Concern  . Not on file   Social History Narrative   Released from prison in 2003 after serving 2 years for indecency with a minor.    He smokes cigarettes greater than 30 years, also smoked a pipe.  Quit tobacco 09/2010.   Denies EtOH and drugs.     Mr. Castorina family history includes Breast cancer in his maternal grandmother; Heart attack in his maternal grandmother; Heart disease in his brother, father, maternal grandmother, and mother; Hypertension in his sister; Peripheral vascular disease in his father; Stomach cancer in his maternal uncle.      Objective:    Vitals:   10/05/16 1108  BP:  110/60  Pulse: 68    Physical Exam  well-developed chronically ill appearing white male, ambulates with a walker. Blood pressure 110/60, pulse 68, height 5 foot, weight 208, BMI 40.6. HEENT; nontraumatic normocephalic EOMI PERRLA sclera anicteric lips pale, Cardiovascular ;regular rate and rhythm with [id number] is a systolic murmur, Pulmonary ;few scattered Rales, Abdomen; massively obese soft nontender no palpable mass or hepatosplenomegaly bowel sounds are present no audible bruit, He has a bandage midline over his abdomen, Rectal ;exam stool is brown and Hemoccult negative Extremities; he has pitting edema bilateral lower extremities with weeping areas of chronic stasis changes , Neuropsych; mood and affect appropriate       Assessment & Plan:   #40 61 year old white male with chronic normocytic anemia, previous workup 2015 for iron deficiency anemia revealing only for tiny 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 These are certainly a known source for chronic GI blood loss. Stool is currently heme negative and he has not had any documented evidence of GI bleeding. He certainly may have chronic losses from small AVMs in the setting of chronic aspirin and Pradaxa  #2 history of small adenomatous colon polyps-last: November 2015 will be due for follow-up 2020 #3 history of CVA #4 COPD O2 dependent #5 atrial fibrillation #6 ischemic cardiomyopathy with EF 45-50% #Coronary artery disease   #8 hypertension  Plan; repeat GI workup not indicated at present age and has known small AVMs in the small bowel. Unfortunately with need for chronic anticoagulation he may have intermittent chronic low-grade GI blood loss. He will need regular serial hemoglobins at least monthly, transfusions as indicated, and replace iron IV as doing. Plan follow-up colonoscopy 2020   Check CBC today He will follow-up with Dr. Hilarie Fredrickson or myself on an as-needed basis.   Amy Genia Harold PA-C 10/05/2016   Cc: Janifer Adie,  MD   Addendum: Reviewed and agree with management. Jerene Bears, MD

## 2016-10-05 NOTE — Patient Instructions (Signed)
If you are age 63 or older, your body mass index should be between 23-30. Your Body mass index is 40.65 kg/m. If this is out of the aforementioned range listed, please consider follow up with your Primary Care Provider.  If you are age 41 or younger, your body mass index should be between 19-25. Your Body mass index is 40.65 kg/m. If this is out of the aformentioned range listed, please consider follow up with your Primary Care Provider.   Your physician has requested that you go to the basement for the following lab work before leaving today: CBC  Thank you for choosing [facility]

## 2016-10-06 ENCOUNTER — Other Ambulatory Visit: Payer: Self-pay

## 2016-10-06 DIAGNOSIS — D500 Iron deficiency anemia secondary to blood loss (chronic): Secondary | ICD-10-CM

## 2016-10-07 ENCOUNTER — Ambulatory Visit (HOSPITAL_COMMUNITY)
Admission: RE | Admit: 2016-10-07 | Discharge: 2016-10-07 | Disposition: A | Discharge status: Home / Self Care | Payer: Medicare (Managed Care) | Source: Ambulatory Visit | Attending: Family Medicine | Admitting: Family Medicine

## 2016-10-07 DIAGNOSIS — D509 Iron deficiency anemia, unspecified: Secondary | ICD-10-CM | POA: Insufficient documentation

## 2016-10-07 MED ORDER — SODIUM CHLORIDE 0.9 % IV SOLN
510.0000 mg | INTRAVENOUS | Status: AC
  Administered 2016-10-07: 11:00:00 510 mg via INTRAVENOUS
  Filled 2016-10-07: qty 17

## 2016-10-10 ENCOUNTER — Encounter: Payer: Self-pay | Admitting: Physician Assistant

## 2016-10-10 ENCOUNTER — Ambulatory Visit (INDEPENDENT_AMBULATORY_CARE_PROVIDER_SITE_OTHER): Payer: Medicare (Managed Care) | Admitting: Physician Assistant

## 2016-10-10 VITALS — BP 140/58 | HR 69 | Ht 60.0 in | Wt 205.1 lb

## 2016-10-10 DIAGNOSIS — I5042 Chronic combined systolic (congestive) and diastolic (congestive) heart failure: Secondary | ICD-10-CM | POA: Diagnosis not present

## 2016-10-10 DIAGNOSIS — D649 Anemia, unspecified: Secondary | ICD-10-CM

## 2016-10-10 DIAGNOSIS — I779 Disorder of arteries and arterioles, unspecified: Secondary | ICD-10-CM

## 2016-10-10 DIAGNOSIS — I480 Paroxysmal atrial fibrillation: Secondary | ICD-10-CM | POA: Diagnosis not present

## 2016-10-10 DIAGNOSIS — I739 Peripheral vascular disease, unspecified: Secondary | ICD-10-CM

## 2016-10-10 DIAGNOSIS — I2510 Atherosclerotic heart disease of native coronary artery without angina pectoris: Secondary | ICD-10-CM

## 2016-10-10 MED ORDER — FUROSEMIDE 80 MG PO TABS
ORAL_TABLET | ORAL | 3 refills | Status: DC
Start: 2016-10-10 — End: 2016-10-12

## 2016-10-10 NOTE — Patient Instructions (Addendum)
Medication Instructions:  Increase lasix (furosemide) to 120mg  in the AM and 80 mg in the PM. This will be 1 and 1/2 of a 80mg  tablet in the AM and 1 of a 80mg  tablet in the PM.  Labwork: BMET/BNP today  BMET in 1 week.  Testing/Procedures: None   Follow-Up: Your physician recommends that you schedule a follow-up appointment in: 2 weeks with [name] or [name] be with [name] on a day [name] is in the office.  If you need a refill on your cardiac medications before your next appointment, please call your pharmacy.

## 2016-10-10 NOTE — Progress Notes (Signed)
Cardiology Office Note:    Date:  10/10/2016   ID:  [name], DOB [birth year], MRN [id number]  PCP:  Joseph Maroon, MD  Cardiologist: Dr. Liam [name] / Joseph Dopp, PA-C Electrophysiologist: N/a VVS: Dr. Scot [name] TCTS: Dr. Cyndia [name] GI: Dr. Hilarie [name]  Referring MD: Joseph Adie, MD   Chief Complaint  Patient presents with  . Follow-up    CHF, CAD, AFib    History of Present Illness:    [name] is a 61 y.o. male with a hx of CAD, combined systolic and diastolic CHF, ischemic CM, PAF, HTN, diabetes, HL, RTA Type 4 with hyperkalemia, prior stroke, carotid stenosis, COPD, intellectual disability, diabetic ulcer s/p R midfoot amputation 2014, iron deficiency anemia.   CHADS2-VASc=6.   Admitted 3/16 with NSTEMI in the setting of sepsis syndrome c/b acute hypoxic respiratory failure secondary to a/c diastolic CHF, AECOPD and AF with RVR. Given his multiple comorbid conditions he was treated conservatively. Then in 8/16, he was diagnosed with progressive L carotid artery disease.  An abnormal nuclear study prompted a LHC that demonstrated 3 v CAD.  He eventually underwent CABG with L-LAD, S-RI, S-OM2 + L CEA on 08/13/15. He had post op AF requiring amiodarone but this was DC'd 2/2 to bradycardia. FUEcho in 4/17 demonstrated improved LVF with EF 30-07%, mod diastolic dysfunction and mod pulmonary HTN. He was admitted again in 02/2016 with sepsis in the setting of CAP.  EF was 45-50 by echo.  He had elevated Troponin levels and an OP nuclear stress test was done.  This was low risk and neg for ischemia.  Last seen by Dr. Liam [name] in 05/2016.  He was admitted in 08/2016 with a/c combined systolic and diastolic CHF in the setting of worsening anemia requiring transfusion with PRBCs.  His Pradaxa was held briefly but resumed at DC.  He was seen by GI during his admission and did follow up 10/05/16 with Joseph Esterwood, PA-C.  The patient has known small bowel AVMs  from prior evaluation.  Plan is to follow Hgb closely and transfuse as necessary.  No further GI evaluation was felt to be necessary at this time.   He returns for follow up.  He is here alone.  He denies any melena or hematochezia.  He denies chest pain.  He has had worsening LE edema and his legs are now wrapped.  He describes dyspnea on exertion as well as orthopnea.  He denies PND.  He denies coughing.    Prior CV studies:   The following studies were reviewed today:  Carotid US 9/17 R 40-59; L CEA patent   Myoview 03/07/16 Nuclear stress EF: 55%. There was no ST segment deviation noted during stress. This is a low risk study. The left ventricular ejection fraction is normal (55-65%). Findings consistent with prior myocardial infarction. 1. Fixed medium-sized, moderate-intensity basal to mid inferior perfusion defect. Fixed small, moderate-intensity apical anterior perfusion defect. No ischemia. The inferior defect in particular appears fairly extensive, so hard to label it as artifact, but wall motion appears normal throughout. Cannot rule out prior infarction.  2. Normal LV systolic function and wall motion.  3. Low risk study given lack of ischemia.   Echo 02/15/16 Mild LVH, EF 45-50%, diffuse HK, mild LAE, trivial pericardial effusion  Echo 11/12/15 EF 55-60%, normal wall motion, grade 2 diastolic dysfunction, MAC, mild mitral stenosis, mild LAE, PASP 55 mmHg, moderate pulmonary hypertension  LHC 04/30/15 LAD: Ostial and mid 95%, distal 40%, ostial  D1 100%, ostial D2 85% RI: Ostial 65% LCx: Proximal to mid 70%, mid 65%, OM2 90% RCA: Ostial 80%, proximal to distal 100%, distal 100%, RPDA 85%, RPAVB 100% EF 35-45%  Myoview 04/23/15 Large, mostly reversible inferior, apical and inferolateral reversible perfusion defect suggestive of ischemia (SDS 10, Extent 37%). This study was not-gated due to atrial fibrillation. This is a high-risk study based on the size and severity of  the perfusion defect.  Carotid US 8/16 R 60-79% L 80-99%  Echo 12/18/14 Mild LVH, EF 55%, wall motion abnormalities could not be excluded, MAC, trivial MR, mild LAE  Echo 10/16/12 Mild LVH, vigorous LVF, EF 65-70%, normal wall motion, grade 1 diastolic dysfunction  Nuclear study 09/06/12 Myoview scan with small apical defect Seen in horizontal images only. Cannot exclude tiny region of apical ischemia vs shift soft tissue. Otherwise normal perfusion. Overall low risk scan. LV Ejection Fraction: 69%. LV Wall Motion: NL LV Function; NL Wall Motion  Past Medical History:  Diagnosis Date  . Abdominal hernia    Chronic, not a good surgical candidate  . Abscess, abdomen ([place]) 12/31/2010   Referred to Wound Care in 01/2011 because of multiple abd abscess with VERY large ventral hernia (please look at image of CT abd/pelvis 09/2010).  Because of hernia I was hesitant to I&D.      Marland Kitchen Anemia    History of Iron Def Anemia  . Anxiety   . AVM (arteriovenous malformation)   . Carotid artery occlusion   . Chronic diastolic CHF (congestive heart failure) (HCC)    takes Lasix  . Chronic low back pain   . COPD (chronic obstructive pulmonary disease) ([place])   . CVA (cerebral infarction) 09/2010   Bilateral with Left > Right  . Diabetes mellitus   . GERD (gastroesophageal reflux disease)   . History of nuclear stress test    Myoview 9/16:  Inferior, apical and inf-lateral ischemia; not gated; High Risk  . Hx of echocardiogram    Echo 5/16:  Mild LVH, EF 55%, indeterm. diast function, WMA could not be ruled out, MAC, trivial MR, mild LAE, normal RVF //  b. Echo 4/17: EF 55-60%, no RWMA, Gr 2 DD, Ao sclerosis, MAC, mild MS, mild LAE, PASP 55 mHg  . Hyperlipidemia   . Hypertension   . Intellectual disability    Sister helps to take care of him and takes him to appts  . Itching    all over body; pt scratches and has sores on bilateral arms and abdomen  . Lung nodule   . Myocardial infarction  2016 ?   Heart attack  (  Per  pt. )  . Obesity   . Oxygen dependent    wears 2 liters at bedtime and when needed  . PAF (paroxysmal atrial fibrillation) ([place]) 06/2009   CHADS score 2 (HTN, DM), was not on coumadin, but now on Pradaxa for Afib  . Pneumonia    hx of  . Shortness of breath dyspnea    with exertion  . Stroke ([place])   . Tobacco user    Smokes 1ppd for multiple years.  Quit after hosp 09/2010.  . Tubular adenoma of colon   . Wears glasses     Past Surgical History:  Procedure Laterality Date  . AMPUTATION Right 03/29/2013   Procedure: AMPUTATION RAY;  Surgeon: Newt Minion, MD;  Location: [facility];  Service: Orthopedics;  Laterality: Right;  Right Foot 3rd and Possible 4th Ray Amputation  .  AMPUTATION Right 04/23/2013   Procedure: AMPUTATION RIGHT MID-FOOT;  Surgeon: Newt Minion, MD;  Location: [place];  Service: Orthopedics;  Laterality: Right;  . arm surgery Left    as a child  . CARDIAC CATHETERIZATION N/A 04/30/2015   Procedure: Left Heart Cath and Coronary Angiography;  Surgeon: Belva Crome, MD;  Location: [facility];  Service: Cardiovascular;  Laterality: N/A;  . Carotid arteriogram  10/2010   30% right ICA stenosis, 40% left ICA stenosis   . CAROTID ENDARTERECTOMY Left 08-13-15   CEA  . CATARACT EXTRACTION, BILATERAL    . COLONOSCOPY WITH PROPOFOL N/A 06/10/2014   Procedure: COLONOSCOPY WITH PROPOFOL;  Surgeon: Jerene Bears, MD;  [facility];  Service: Gastroenterology;  Laterality: N/A;  . CORONARY ARTERY BYPASS GRAFT N/A 08/13/2015   Procedure: CORONARY ARTERY BYPASS GRAFTING (CABG), ON PUMP, TIMES THREE, USING LEFT INTERNAL MAMMARY ARTERY, RIGHT GREATER SAPHENOUS VEIN HARVESTED ENDOSCOPICALLY;  Surgeon: [name], MD;  Location: [facility];  Service: Open Heart Surgery;  Laterality: N/A;  . DEBRIDMENT OF DECUBITUS ULCER Right 02/13/2013  . ENDARTERECTOMY Left 08/13/2015   Procedure: ENDARTERECTOMY CAROTID;  Surgeon: Angelia Mould, MD;   Location: [facility];  Service: Vascular;  Laterality: Left;  . ESOPHAGOGASTRODUODENOSCOPY (EGD) WITH PROPOFOL N/A 06/10/2014   Procedure: ESOPHAGOGASTRODUODENOSCOPY (EGD) WITH PROPOFOL;  Surgeon: Jerene Bears, MD;  [facility];  Service: Gastroenterology;  Laterality: N/A;  . GIVENS CAPSULE STUDY N/A 07/09/2014   Procedure: GIVENS CAPSULE STUDY;  Surgeon: Jerene Bears, MD;  [facility];  Service: Gastroenterology;  Laterality: N/A;  . I&D EXTREMITY Right 02/13/2013   Procedure: IRRIGATION AND DEBRIDEMENT FOOT ULCER;  Surgeon: Johnny Bridge, MD;  Location: [facility];  Service: Orthopedics;  Laterality: Right;  PULSE LAVAGE  . MULTIPLE TOOTH EXTRACTIONS    . TEE WITHOUT CARDIOVERSION N/A 08/13/2015   Procedure: TRANSESOPHAGEAL ECHOCARDIOGRAM (TEE);  Surgeon: [name], MD;  Location: [facility];  Service: Open Heart Surgery;  Laterality: N/A;  . TRANSESOPHAGEAL ECHOCARDIOGRAM  09/2010   No ASD or PFO. EF 60-65%.  Normal systolic function. No evidence of thrombus.   . TRANSTHORACIC ECHOCARDIOGRAM  09/2010    The cavity size was normal. Systolic function was vigorous.  EF 65-70%.  Normal wall funciton.     Current Medications: Current Meds  Medication Sig  . acetaminophen (TYLENOL) 325 MG tablet Take 650 mg by mouth 3 (three) times daily as needed for mild pain.  Marland Kitchen amLODipine (NORVASC) 5 MG tablet Take 5 mg by mouth daily.  Marland Kitchen atorvastatin (LIPITOR) 80 MG tablet Take 80 mg by mouth daily.  . calcium carbonate (TUMS - DOSED IN MG ELEMENTAL CALCIUM) 500 MG chewable tablet Chew 1 tablet by mouth 3 (three) times daily as needed for indigestion or heartburn.  . Cholecalciferol (VITAMIN D3) 1000 UNITS CAPS Take 1,000 Units by mouth daily.  . clonazePAM (KLONOPIN) 0.5 MG tablet Take 1 tablet (0.5 mg total) by mouth at bedtime.  . fludrocortisone (FLORINEF) 0.1 MG tablet Take 0.1 mg by mouth daily.  Marland Kitchen gabapentin (NEURONTIN) 600 MG tablet Take 600 mg by mouth 2 (two) times daily.  Marland Kitchen  HYDROcodone-acetaminophen (NORCO) 5-325 MG tablet Take 1 tablet by mouth daily.  . hydrocortisone (ANUSOL-HC) 2.5 % rectal cream Place 1 application rectally 3 (three) times daily as needed for hemorrhoids or itching.  . Insulin Glargine (LANTUS) 100 UNIT/ML Solostar Pen Inject 10 Units into the skin every evening.  Marland Kitchen ipratropium-albuterol (DUONEB) 0.5-2.5 (  3) MG/3ML SOLN Take 3 mLs by nebulization 2 (two) times daily.  . metFORMIN (GLUCOPHAGE) 1000 MG tablet Take 1,000 mg by mouth 2 (two) times daily with a meal.  . metolazone (ZAROXOLYN) 5 MG tablet Take 5 mg by mouth daily.  . metoprolol tartrate (LOPRESSOR) 25 MG tablet Take 25 mg by mouth 2 (two) times daily.  . nitroGLYCERIN (NITROSTAT) 0.4 MG SL tablet Place 0.4 mg under the tongue every 5 (five) minutes as needed for chest pain.  Marland Kitchen nystatin (MYCOSTATIN/NYSTOP) powder Apply topically 3 (three) times daily.  . pantoprazole (PROTONIX) 40 MG tablet Take 40 mg by mouth daily.  Marland Kitchen PARoxetine (PAXIL) 40 MG tablet Take 40 mg by mouth daily.   . polyvinyl alcohol (LIQUIFILM TEARS) 1.4 % ophthalmic solution Place 1 drop into both eyes 2 (two) times daily as needed for dry eyes. Natural Tears  . sucralfate (CARAFATE) 1 GM/10ML suspension Take 10 mLs (1 g total) by mouth 4 (four) times daily -  with meals and at bedtime.  . [DISCONTINUED] furosemide (LASIX) 80 MG tablet Take 80 mg by mouth 2 (two) times daily.     Allergies:   Penicillins   Social History   Social History  . Marital status: Single    Spouse name: N/A  . Number of children: 0  . Years of education: N/A   Occupational History  .  Disabled   Social History Main Topics  . Smoking status: Former Smoker    Packs/day: 1.00    Years: 42.00    Types: Cigarettes, Pipe    Quit date: 11/10/2010  . Smokeless tobacco: Former Systems developer    Quit date: 10/02/2010  . Alcohol use No     Comment: " last drink was 2003" 08/11/15  . Drug use: No  . Sexual activity: Not Asked   Other Topics  Concern  . None   Social History Narrative   Released from prison in 2003 after serving 2 years for indecency with a minor.    He smokes cigarettes greater than 30 years, also smoked a pipe.  Quit tobacco 09/2010.   Denies EtOH and drugs.      Family History  Problem Relation Age of Onset  . Heart disease Mother   . Hypertension Sister   . Heart disease Brother   . Heart disease Father   . Peripheral vascular disease Father   . Heart disease Maternal Grandmother   . Heart attack Maternal Grandmother   . Breast cancer Maternal Grandmother   . Stomach cancer Maternal Uncle   . Colon cancer Neg Hx   . Stroke Neg Hx      ROS:   Please see the history of present illness.    ROS All other systems reviewed and are negative.   EKGs/Labs/Other Test Reviewed:    EKG:  EKG is not ordered today.  The ekg ordered today demonstrates n/a  Recent Labs: 08/11/2016: ALT 12; B Natriuretic Peptide 476.4 08/14/2016: BUN 26; Creatinine, Ser 0.88; Potassium 4.7; Sodium 139 10/05/2016: Hemoglobin 8.5 Repeated and verified X2.; Platelets 378.0   Recent Lipid Panel    Component Value Date/Time   CHOL 99 (L) 07/14/2015 1149   TRIG 156 (H) 07/14/2015 1149   HDL 21 (L) 07/14/2015 1149   CHOLHDL 4.7 07/14/2015 1149   VLDL 31 (H) 07/14/2015 1149   LDLCALC 47 07/14/2015 1149   LDLDIRECT 54 08/23/2012 1447     Physical Exam:    VS:  BP (!) 140/58   Pulse  69   Ht 5' (1.524 m)   Wt 205 lb 1.9 oz (93 kg)   BMI 40.06 kg/m     Wt Readings from Last 3 Encounters:  10/10/16 205 lb 1.9 oz (93 kg)  10/07/16 208 lb (94.3 kg)  10/05/16 208 lb 2 oz (94.4 kg)     Physical Exam  Constitutional: He is oriented to person, place, and time. He appears well-developed and well-nourished. No distress.  HENT:  Head: Normocephalic and atraumatic.  Neck:  I cannot assess JVD  Cardiovascular: Normal rate, regular rhythm and normal heart sounds.   No murmur heard. Pulmonary/Chest: He has no wheezes. He has  rales in the right lower field and the left lower field.  Abdominal: Bowel sounds are normal.  Musculoskeletal: He exhibits edema.  Tight 1-2+ bilateral LE edema up to his thighs.  Neurological: He is alert and oriented to person, place, and time.  Skin: Skin is warm and dry.  Psychiatric: He has a normal mood and affect.    ASSESSMENT:    1. Coronary artery disease involving native coronary artery of native heart without angina pectoris   2. Chronic combined systolic and diastolic CHF (congestive heart failure) ([place])   3. PAF (paroxysmal atrial fibrillation) ([place])   4. Bilateral carotid artery disease ([place])   5. Normocytic anemia    PLAN:    In order of problems listed above:  1. CAD - History of non-STEMI in 3/16 while hospitalized for acute, multiple medical illnesses.  LHC in 9/16 with 3v CAD and he is s/p CABG + L CEA in 1/17.  Nuclear stress test in 7/17 was low risk without significant ischemia.  He denies angina.  He is off of his Pradaxa and ASA due to recent worsening anemia requiring transfusion with PRBCs.  He remains on Lipitor and Metoprolol.   2. Chronic combined systolic and diastolic CHF -  He has an ischemic CM with prior EF 35-45.  EF has improved since his CABG.  He was recently admitted with acute CHF in the setting of worsening anemia.   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  ACE/ARB should be avoided. He is currently volume overloaded.  I did speak to Dr. Bradd Burner by phone.  His diuretics have been adjusted by him at [facility].  It is not clear if Mr. Savastano is adherent to taking his medications or if his CHF is being driven by his anemia.  He is on high dose Lasix and Metolazone.    -  Increase Lasix to 120 in A and 80 in pm  -  BMET, BNP today  -  Repeat BMET in 1 week.  -  Close FU in 2 weeks.    3. PAF - CHADS2-VASc=6.  He has a prior stroke and is at high risk for recurrent stroke.  He is now off of his Pradaxa due to worsening anemia that is likely related  to chronic GI blood loss.  I did speak with Dr. Bradd Burner.  Mr. Zweber is receiving iron infusions and has a repeat CBC this week.  Dr. Bradd Burner had hoped to resume his Pradaxa once his Hgb was more stable.  I think this is likely the best plan for now.  I will also review with GI.  4. HTN - BP is borderline.  Continue current Rx.  Adjust diuretics as noted.    5. Carotid artery disease -  s/p L CEA.  He is followed by [facility].  6. Normocytic anemia - As noted, he is receiving iron infusions.  His PCP will check his Hgb again this week.  We hope to get him back on anticoagulation once his Hgb is more stable.  He did see GI and no further workup is planned.  As noted, I will review with GI as well.    Medication Adjustments/Labs and Tests Ordered: Current medicines are reviewed at length with the patient today.  Concerns regarding medicines are outlined above.  Medication changes, Labs and Tests ordered today are outlined in the Patient Instructions noted below. Patient Instructions  Medication Instructions:  Increase lasix (furosemide) to 127m in the AM and 80 mg in the PM. This will be 1 and 1/2 of a 854mtablet in the AM and 1 of a 8074mablet in the PM.  Labwork: BMET/BNP today  BMET in 1 week.  Testing/Procedures: None   Follow-Up: Your physician recommends that you schedule a follow-up appointment in: 2 weeks with ScoNicki Reaper Dr NahArlana Lindau with ScoNicki Reaper a day Dr NahAcie [name] in the office.  If you need a refill on your cardiac medications before your next appointment, please call your pharmacy.   Return in about 2 weeks (around 10/24/2016) for Close Follow Up, w/ ScoRichardson DoppA-C.   Signed, ScoRichardson DoppA-C  10/10/2016 1:48 PM    [facility] [address]  [phone]: [phone]: [phone]

## 2016-10-11 ENCOUNTER — Telehealth: Payer: Self-pay | Admitting: *Deleted

## 2016-10-11 LAB — BASIC METABOLIC PANEL
BUN/Creatinine Ratio: 21 (ref 10–24)
BUN: 24 mg/dL (ref 8–27)
CALCIUM: 8 mg/dL — AB (ref 8.6–10.2)
CO2: 26 mmol/L (ref 18–29)
CREATININE: 1.12 mg/dL (ref 0.76–1.27)
Chloride: 95 mmol/L — ABNORMAL LOW (ref 96–106)
GFR calc Af Amer: 81 mL/min/{1.73_m2} (ref >59)
GFR, EST NON AFRICAN AMERICAN: 70 mL/min/{1.73_m2} (ref >59)
Glucose: 113 mg/dL — ABNORMAL HIGH (ref 65–99)
POTASSIUM: 3.8 mmol/L (ref 3.5–5.2)
Sodium: 143 mmol/L (ref 134–144)

## 2016-10-11 LAB — PRO B NATRIURETIC PEPTIDE: NT-Pro BNP: 12030 pg/mL — ABNORMAL HIGH (ref 0–210)

## 2016-10-11 NOTE — Telephone Encounter (Signed)
DPR for both pt's sister [name] and his niece Abigail Butts. I lmtcb on Wendy's phone for a call back to go over lab results and medication changes.

## 2016-10-12 ENCOUNTER — Telehealth: Payer: Self-pay | Admitting: Cardiovascular Disease

## 2016-10-12 MED ORDER — FUROSEMIDE 80 MG PO TABS: 120.0000 mg | ORAL_TABLET | Freq: Two times a day (BID) | ORAL | Status: DC

## 2016-10-12 MED ORDER — POTASSIUM CHLORIDE CRYS ER 20 MEQ PO TBCR: 20.0000 meq | EXTENDED_RELEASE_TABLET | Freq: Every day | ORAL | Status: DC

## 2016-10-12 NOTE — Telephone Encounter (Signed)
Per Kennyth Lose with [facility] to fax over results and recommendations to Dr. Erskine Emery who manages pt's medications there at [facility]. Lab results faxed over today.

## 2016-10-12 NOTE — Telephone Encounter (Signed)
Lmtcb at [facility] for the nursing staff to return my call in regards to lab results and medications changes. I s/w pt last night and he gave me permission ok to s/w [facility] about his results. [facility] has a nursing clinic and they handle the pt's medications there.

## 2016-10-12 NOTE — Telephone Encounter (Signed)
New message    Kennyth Lose is calling back for [place]. She stated fax any information on pt to Dr. Erskine Emery at [phone]

## 2016-10-12 NOTE — Telephone Encounter (Signed)
Kennyth Lose called back from [facility]. She states to fax over information to Dr. Erskine Emery at [place]. I have faxed over results with the recommendations based on lab results from [place]. Per [place] PA to increase lasix to 120 mg BID, start K+ 20 meq daily with a repeat BMET to be done in 1 week. Fax # for [facility] is [phone].

## 2016-10-17 ENCOUNTER — Other Ambulatory Visit: Payer: Medicare (Managed Care)

## 2016-10-20 ENCOUNTER — Encounter: Payer: Self-pay | Admitting: Vascular Surgery

## 2016-10-21 ENCOUNTER — Other Ambulatory Visit: Payer: Medicare (Managed Care)

## 2016-10-25 NOTE — Progress Notes (Signed)
Lmtcb for the nurse Kennyth Lose at [facility] in regards to if recent lab work has been done to recheck pt's Hgb and if so that we may have a copy faxed over to our office.   Per Brynda Rim. PA states GI felt that it would be ok to resume Pradaxa with close follow up on hemoglobin. If his hemoglobin has improved, we may be able to go ahead and resume the Pradaxa this week.

## 2016-10-27 ENCOUNTER — Telehealth: Payer: Self-pay | Admitting: Cardiovascular Disease

## 2016-10-27 ENCOUNTER — Telehealth: Payer: Self-pay | Admitting: *Deleted

## 2016-10-27 NOTE — Telephone Encounter (Signed)
New message   Pt is returning call to [place] about pt.

## 2016-10-27 NOTE — Progress Notes (Signed)
Lmtcb x 2 to s/w Kennyth Lose in regards to see if CBC has been completed yet. Per Richardson Dopp, PA if his hemoglobin has improved, we may be able to go ahead and resume the Pradaxa this week. Dr. Bradd Burner was going to repeat it a couple times after I last talked to him.

## 2016-10-27 NOTE — Telephone Encounter (Signed)
Lmtcb x 2 to s/w Kennyth Lose in regards to see if CBC has been completed yet. Per Richardson Dopp, PA if his hemoglobin has improved, we may be able to go ahead and resume the Pradaxa this week. Dr. Bradd Burner was going to repeat it a couple times after I last talked to him.

## 2016-10-27 NOTE — Telephone Encounter (Signed)
-----   Message from Liliane Shi, [place] sent at 10/23/2016  9:12 PM EDT ----- Arbie Cookey Can you call his PCP (Dr. Bradd Burner) this week. GI felt that it would be ok to resume Pradaxa with close follow up on hemoglobin. If his hemoglobin has improved, we may be able to go ahead and resume the Pradaxa this week. Dr. Bradd Burner was going to repeat it a couple times after I last talked to him. Ask for his most recent hemoglobin and you can run it by [name]. Thanks, Event organiser   ----- Message ----- From: Jerene Bears, MD Sent: 10/10/2016   4:58 PM To: Amy S Esterwood, PA-C, Scott T Weaver, PA-C  [place],  [place] for Pradaxa with close attn to Hgb and iron stores.  I expect he will need periodic iron infusions Thanks Ulice Dash  ----- Message ----- From: Liliane Shi, PA-C Sent: 10/10/2016   1:48 PM To: Jerene Bears, MD  Amy, Dr. Hilarie Fredrickson - I just want to clarify with you on Mr. Miron regarding his anticoagulation.  Is it ok to try to resume his Pradaxa with close follow up of his Hgb or should we avoid trying to resume this medication? Thanks, [employer]

## 2016-10-28 ENCOUNTER — Ambulatory Visit (INDEPENDENT_AMBULATORY_CARE_PROVIDER_SITE_OTHER): Payer: Medicare (Managed Care) | Admitting: Cardiovascular Disease

## 2016-10-28 ENCOUNTER — Encounter: Payer: Self-pay | Admitting: Cardiovascular Disease

## 2016-10-28 VITALS — BP 102/54 | HR 69 | Ht 60.0 in | Wt 201.4 lb

## 2016-10-28 DIAGNOSIS — I2510 Atherosclerotic heart disease of native coronary artery without angina pectoris: Secondary | ICD-10-CM | POA: Diagnosis not present

## 2016-10-28 DIAGNOSIS — I5042 Chronic combined systolic (congestive) and diastolic (congestive) heart failure: Secondary | ICD-10-CM

## 2016-10-28 NOTE — Progress Notes (Signed)
Cardiology Office Note   Date:  10/28/2016   ID:  [name], DOB 1955/12/19, MRN [id number]  PCP:  [name], MD  Cardiologist:  [name]   Vascular Surgeon:  Dr. Scot Dock   Chief Complaint  Patient presents with  . Coronary Artery Disease  . Congestive Heart Failure     History of Present Illness: [name] is a 61 y.o. male with a hx of PAF, HTN, diabetes, HL, prior stroke, carotid stenosis, COPD, mental retardation. He was evaluated by Dr. Acie Fredrickson in 08/2012 for chest pain. Myoview was low risk. Echocardiogram in 3/14 demonstrated normal LV function with mild diastolic dysfunction. He has a history of diabetic ulcer and underwent right midfoot amputation in 04/2013.  Admitted 10/2014 with acute hypoxic respiratory failure secondary to a/c diastolic CHF, AECOPD and AF with RVR in the setting of sepsis syndrome related to left lower extremity cellulitis from diabetic foot ulcer.  He had elevated troponins possibly related to demand ischemia.  He converted to sinus rhythm with rate control therapy.   Given his multiple comorbid conditions, it was decided to treat him conservatively for his non-STEMI.  In follow-up, continued conservative management was recommended for his recent non-STEMI. However, he was seen Dr. Scot Dock with vascular surgery and noted to have progressive carotid artery stenosis on the left.  He needs carotid endarterectomy.  He was seen for surgical clearance and he did complain of occasional atypical sounding chest pain.  Myoview was arranged for risk stratification.  This demonstrated inferior, apical, inferolateral ischemia. Study was felt to be high risk.   LHC 04/30/15 demonstrated 3v CAD.  CABG has been recommended.  He returns for FU.  Since his LHC, he is doing well. He had one episode of chest pain that resolved quickly.  He denies significant changes in his dyspnea.  No orthopnea, PND. He has chronic LE edema without change.  No  syncope.      Studies/Reports Reviewed Today:  LHC 04/30/15 LAD: Ostial and mid 95%, distal 40%, ostial D1 100%, ostial D2 85% RI: Ostial 65% LCx: Proximal to mid 70%, mid 65%, OM2 90% RCA: Ostial 80%, proximal to distal 100%, distal 100%, RPDA 85%, RPAVB 100% EF 35-45%     Myoview 04/23/15  No T wave inversion was noted during stress.  There was no ST segment deviation noted during stress.  Defect 1: There is a large defect of severe severity.  Findings consistent with ischemia.  This is a high risk study. Large, mostly reversible inferior, apical and inferolateral reversible perfusion defect suggestive of ischemia (SDS 10, Extent 37%). This study was not-gated due to atrial fibrillation. This is a high-risk study based on the size and severity of the perfusion defect.  Carotid US 8/16 R 60-79% L 80-99%  Echo 12/18/14 Mild LVH, EF 55%, wall motion abnormalities could not be excluded, MAC, trivial MR, mild LAE  Echo 10/16/12 Mild LVH, vigorous LVF, EF 65-70%, normal wall motion, grade 1 diastolic dysfunction  Nuclear study 09/06/12 Myoview scan with small apical defect Seen in horizontal images only. Cannot exclude tiny region of apical ischemia vs shift soft tissue. Otherwise normal perfusion. Overall low risk scan.  LV Ejection Fraction: 69%. LV Wall Motion: NL LV Function; NL Wall Motion  Dec. 7, 2016: [name] is seen today for follow up. Was turned down for CABG by Dr. Cyndia Bent   Oct. 5, 2017:  [name] is seen Was in the hospital in July with sepsis and + troponin .  Echo showed EF of 45-50%  Overall dong well  Does not do much exercise.   Rides a stationary bike   October 28, 2016:  Doing about the same Uses home O2 at night.  He was hospitalized in January, 2018 with combined systolic and diastolic congestive heart failure. He was also noted to have anemia and required transfusion. His Pradaxa was held briefly but was resumed at that discharge. He has seen  the gastroenterologist and was found to have AVMs.  Past Medical History:  Diagnosis Date  . Abdominal hernia    Chronic, not a good surgical candidate  . Abscess, abdomen ([place]) 12/31/2010   Referred to Wound Care in 01/2011 because of multiple abd abscess with VERY large ventral hernia (please look at image of CT abd/pelvis 09/2010).  Because of hernia I was hesitant to I&D.      Marland Kitchen Anemia    History of Iron Def Anemia  . Anxiety   . AVM (arteriovenous malformation)   . Carotid artery occlusion   . Chronic diastolic CHF (congestive heart failure) (HCC)    takes Lasix  . Chronic low back pain   . COPD (chronic obstructive pulmonary disease) ([place])   . CVA (cerebral infarction) 09/2010   Bilateral with Left > Right  . Diabetes mellitus   . GERD (gastroesophageal reflux disease)   . History of nuclear stress test    Myoview 9/16:  Inferior, apical and inf-lateral ischemia; not gated; High Risk  . Hx of echocardiogram    Echo 5/16:  Mild LVH, EF 55%, indeterm. diast function, WMA could not be ruled out, MAC, trivial MR, mild LAE, normal RVF //  b. Echo 4/17: EF 55-60%, no RWMA, Gr 2 DD, Ao sclerosis, MAC, mild MS, mild LAE, PASP 55 mHg  . Hyperlipidemia   . Hypertension   . Intellectual disability    Sister helps to take care of him and takes him to appts  . Itching    all over body; pt scratches and has sores on bilateral arms and abdomen  . Lung nodule   . Myocardial infarction 2016 ?   Heart attack  (  Per  pt. )  . Obesity   . Oxygen dependent    wears 2 liters at bedtime and when needed  . PAF (paroxysmal atrial fibrillation) ([place]) 06/2009   CHADS score 2 (HTN, DM), was not on coumadin, but now on Pradaxa for Afib  . Pneumonia    hx of  . Shortness of breath dyspnea    with exertion  . Stroke ([place])   . Tobacco user    Smokes 1ppd for multiple years.  Quit after hosp 09/2010.  . Tubular adenoma of colon   . Wears glasses     Past Surgical History:  Procedure Laterality  Date  . AMPUTATION Right 03/29/2013   Procedure: AMPUTATION RAY;  Surgeon: Newt Minion, MD;  Location: [facility];  Service: Orthopedics;  Laterality: Right;  Right Foot 3rd and Possible 4th Ray Amputation  . AMPUTATION Right 04/23/2013   Procedure: AMPUTATION RIGHT MID-FOOT;  Surgeon: Newt Minion, MD;  Location: [facility];  Service: Orthopedics;  Laterality: Right;  . arm surgery Left    as a child  . CARDIAC CATHETERIZATION N/A 04/30/2015   Procedure: Left Heart Cath and Coronary Angiography;  Surgeon: Belva Crome, MD;  Location: [facility];  Service: Cardiovascular;  Laterality: N/A;  . Carotid arteriogram  10/2010   30% right ICA stenosis,  40% left ICA stenosis   . CAROTID ENDARTERECTOMY Left 08-13-15   CEA  . CATARACT EXTRACTION, BILATERAL    . COLONOSCOPY WITH PROPOFOL N/A 06/10/2014   Procedure: COLONOSCOPY WITH PROPOFOL;  Surgeon: Jerene Bears, MD;  Location: [facility];  Service: Gastroenterology;  Laterality: N/A;  . CORONARY ARTERY BYPASS GRAFT N/A 08/13/2015   Procedure: CORONARY ARTERY BYPASS GRAFTING (CABG), ON PUMP, TIMES THREE, USING LEFT INTERNAL MAMMARY ARTERY, RIGHT GREATER SAPHENOUS VEIN HARVESTED ENDOSCOPICALLY;  Surgeon: [name], MD;  Location: [facility];  Service: Open Heart Surgery;  Laterality: N/A;  . DEBRIDMENT OF DECUBITUS ULCER Right 02/13/2013  . ENDARTERECTOMY Left 08/13/2015   Procedure: ENDARTERECTOMY CAROTID;  Surgeon: Angelia Mould, MD;  Location: [facility];  Service: Vascular;  Laterality: Left;  . ESOPHAGOGASTRODUODENOSCOPY (EGD) WITH PROPOFOL N/A 06/10/2014   Procedure: ESOPHAGOGASTRODUODENOSCOPY (EGD) WITH PROPOFOL;  Surgeon: Jerene Bears, MD;  [facility];  Service: Gastroenterology;  Laterality: N/A;  . GIVENS CAPSULE STUDY N/A 07/09/2014   Procedure: GIVENS CAPSULE STUDY;  Surgeon: Jerene Bears, MD;  [facility];  Service: Gastroenterology;  Laterality: N/A;  . I&D EXTREMITY Right 02/13/2013   Procedure: IRRIGATION AND  DEBRIDEMENT FOOT ULCER;  Surgeon: Johnny Bridge, MD;  Location: [facility];  Service: Orthopedics;  Laterality: Right;  PULSE LAVAGE  . MULTIPLE TOOTH EXTRACTIONS    . TEE WITHOUT CARDIOVERSION N/A 08/13/2015   Procedure: TRANSESOPHAGEAL ECHOCARDIOGRAM (TEE);  Surgeon: [name], MD;  Location: [facility];  Service: Open Heart Surgery;  Laterality: N/A;  . TRANSESOPHAGEAL ECHOCARDIOGRAM  09/2010   No ASD or PFO. EF 60-65%.  Normal systolic function. No evidence of thrombus.   . TRANSTHORACIC ECHOCARDIOGRAM  09/2010    The cavity size was normal. Systolic function was vigorous.  EF 65-70%.  Normal wall funciton.      Current Outpatient Prescriptions  Medication Sig Dispense Refill  . acetaminophen (TYLENOL) 325 MG tablet Take 650 mg by mouth 3 (three) times daily as needed for mild pain.    Marland Kitchen amLODipine (NORVASC) 5 MG tablet Take 5 mg by mouth daily.    Marland Kitchen atorvastatin (LIPITOR) 80 MG tablet Take 80 mg by mouth daily.    . calcium carbonate (TUMS - DOSED IN MG ELEMENTAL CALCIUM) 500 MG chewable tablet Chew 1 tablet by mouth 3 (three) times daily as needed for indigestion or heartburn.    . Cholecalciferol (VITAMIN D3) 1000 UNITS CAPS Take 1,000 Units by mouth daily.    . clonazePAM (KLONOPIN) 0.5 MG tablet Take 1 tablet (0.5 mg total) by mouth at bedtime. 30 tablet 0  . fludrocortisone (FLORINEF) 0.1 MG tablet Take 0.1 mg by mouth daily.    . furosemide (LASIX) 80 MG tablet Take 1.5 tablets (120 mg total) by mouth 2 (two) times daily.    Marland Kitchen gabapentin (NEURONTIN) 600 MG tablet Take 600 mg by mouth 2 (two) times daily.    Marland Kitchen HYDROcodone-acetaminophen (NORCO) 5-325 MG tablet Take 1 tablet by mouth daily.    . hydrocortisone (ANUSOL-HC) 2.5 % rectal cream Place 1 application rectally 3 (three) times daily as needed for hemorrhoids or itching.    . Insulin Glargine (LANTUS) 100 UNIT/ML Solostar Pen Inject 10 Units into the skin every evening.    Marland Kitchen ipratropium-albuterol (DUONEB) 0.5-2.5 (3) MG/3ML SOLN Take  3 mLs by nebulization 2 (two) times daily.    . metFORMIN (GLUCOPHAGE) 1000 MG tablet Take 1,000 mg by mouth 2 (two) times daily with a meal.    .  metolazone (ZAROXOLYN) 5 MG tablet Take 5 mg by mouth daily.    . metoprolol tartrate (LOPRESSOR) 25 MG tablet Take 25 mg by mouth 2 (two) times daily.    . nitroGLYCERIN (NITROSTAT) 0.4 MG SL tablet Place 0.4 mg under the tongue every 5 (five) minutes as needed for chest pain.    Marland Kitchen nystatin (MYCOSTATIN/NYSTOP) powder Apply topically 3 (three) times daily. 15 g 2  . pantoprazole (PROTONIX) 40 MG tablet Take 40 mg by mouth daily.    Marland Kitchen PARoxetine (PAXIL) 40 MG tablet Take 40 mg by mouth daily.     . polyvinyl alcohol (LIQUIFILM TEARS) 1.4 % ophthalmic solution Place 1 drop into both eyes 2 (two) times daily as needed for dry eyes. Natural Tears    . potassium chloride SA (K-DUR,KLOR-CON) 20 MEQ tablet Take 1 tablet (20 mEq total) by mouth daily.    . sucralfate (CARAFATE) 1 GM/10ML suspension Take 10 mLs (1 g total) by mouth 4 (four) times daily -  with meals and at bedtime. 420 mL 0   No current facility-administered medications for this visit.     Allergies:   Penicillins    Social History:  The patient  reports that he quit smoking about 5 years ago. His smoking use included Cigarettes and Pipe. He has a 42.00 pack-year smoking history. He quit smokeless tobacco use about 6 years ago. He reports that he does not drink alcohol or use drugs.   Family History:  The patient's family history includes Breast cancer in his maternal grandmother; Heart attack in his maternal grandmother; Heart disease in his brother, father, maternal grandmother, and mother; Hypertension in his sister; Peripheral vascular disease in his father; Stomach cancer in his maternal uncle.    ROS:   Please see the history of present illness.   Review of Systems  All other systems reviewed and are negative.    PHYSICAL EXAM: VS:  BP (!) 102/54   Pulse 69   Ht 5' (1.524 m)    Wt 201 lb 6.4 oz (91.4 kg)   SpO2 91%   BMI 39.33 kg/m     Wt Readings from Last 3 Encounters:  10/28/16 201 lb 6.4 oz (91.4 kg)  10/10/16 205 lb 1.9 oz (93 kg)  10/07/16 208 lb (94.3 kg)     GEN: WN/WD male, in no acute distress  HEENT: normal  Neck: I cannot assess JVD, no masses Cardiac:  Normal S1/S2, RRR; no murmur ,  no rubs or gallops, trace bilateral LE edema Respiratory:  Decreased breath sounds bilaterally, no wheezing. GI: Large ventral hernia noted, soft, + BS MS: no deformity or atrophy  Skin: warm and dry  Neuro:  CNs II-XII intact, Strength and sensation are intact Psych: Normal affect   EKG:  EKG is no ordered today.  It demonstrates:   n/a   Recent Labs: 08/11/2016: ALT 12; B Natriuretic Peptide 476.4 10/05/2016: Hemoglobin 8.5 Repeated and verified X2.; Platelets 378.0 10/10/2016: BUN 24; Creatinine, Ser 1.12; NT-Pro BNP 12,030; Potassium 3.8; Sodium 143    Lipid Panel    Component Value Date/Time   CHOL 99 (L) 07/14/2015 1149   TRIG 156 (H) 07/14/2015 1149   HDL 21 (L) 07/14/2015 1149   CHOLHDL 4.7 07/14/2015 1149   VLDL 31 (H) 07/14/2015 1149   LDLCALC 47 07/14/2015 1149   LDLDIRECT 54 08/23/2012 1447     ASSESSMENT AND PLAN:  1. CAD:    History of non-STEMI in 10/2014 while hospitalized for acute,  multiple, medical illnesses.  Recent high risk Myoview with a large area of inferior ischemia.  H. [place] 9/22 demonstrated severe 3v CAD and reduced EF 35-45%.   He is s/p CABG   Will have him see Richardson Dopp in 6 months and return to see me again in 1 year.   .  Continue ASA, Lipitor, beta-blocker, nitrates.  2. Chronic combined systolic and diastolic CHF (congestive heart failure):  He was hospitalized with a congestive heart failure exacerbation in January. He saw Nicki Reaper in early March and his Lasix dose was increased. He thinks he feels a little bit better since that time. He still eats salty foods     3. Paroxysmal atrial fibrillation:   CHADS2VASC  score of 6  . He remains off the Pradaxa due to a serious GI bleed ( presumably  from his AVMs) Will continue current meds    4. Essential hypertension:  Controlled.  5. HYPERCHOLESTEROLEMIA:  Continue statin.    6. Carotid stenosis, bilateral:  - no symptoms      Will see Richardson Dopp in 6 months  Will see me in 1 year    Mertie Moores, MD  10/28/2016 3:41 PM    [facility] [address],  [place], [place]  [address] Pager [phone] Phone: [phone]; Fax: [phone]

## 2016-10-28 NOTE — Patient Instructions (Signed)
Medication Instructions:  Your physician recommends that you continue on your current medications as directed. Please refer to the Current Medication list given to you today.   Labwork: TODAY - basic metabolic panel   Testing/Procedures: None Ordered   Follow-Up: Your physician wants you to follow-up in: 6 months with Dr. Nahser. You will receive a reminder letter in the mail two months in advance. If you don't receive a letter, please call our office to schedule the follow-up appointment.   If you need a refill on your cardiac medications before your next appointment, please call your pharmacy.   Thank you for choosing [facility]! Michelle Swinyer, RN [phone]    

## 2016-10-29 LAB — BASIC METABOLIC PANEL
BUN/Creatinine Ratio: 32 — ABNORMAL HIGH (ref 10–24)
BUN: 52 mg/dL — ABNORMAL HIGH (ref 8–27)
CALCIUM: 8.3 mg/dL — AB (ref 8.6–10.2)
CHLORIDE: 93 mmol/L — AB (ref 96–106)
CO2: 21 mmol/L (ref 18–29)
Creatinine, Ser: 1.62 mg/dL — ABNORMAL HIGH (ref 0.76–1.27)
GFR calc Af Amer: 52 mL/min/{1.73_m2} — ABNORMAL LOW (ref >59)
GFR, EST NON AFRICAN AMERICAN: 45 mL/min/{1.73_m2} — AB (ref >59)
GLUCOSE: 150 mg/dL — AB (ref 65–99)
POTASSIUM: 4.2 mmol/L (ref 3.5–5.2)
Sodium: 141 mmol/L (ref 134–144)

## 2016-10-31 ENCOUNTER — Telehealth: Payer: Self-pay | Admitting: Nurse Practitioner

## 2016-10-31 ENCOUNTER — Encounter: Payer: Self-pay | Admitting: *Deleted

## 2016-10-31 DIAGNOSIS — I5042 Chronic combined systolic (congestive) and diastolic (congestive) heart failure: Secondary | ICD-10-CM

## 2016-10-31 MED ORDER — METOLAZONE 5 MG PO TABS: 5.0000 mg | ORAL_TABLET | ORAL | 3 refills | Status: DC

## 2016-10-31 NOTE — Telephone Encounter (Signed)
Fax to [facility] confirmed

## 2016-10-31 NOTE — Telephone Encounter (Signed)
This encounter was created in error - please disregard.

## 2016-10-31 NOTE — Telephone Encounter (Signed)
I tried to return Kennyth Lose from [facility] call from last week in regards to pt and recent lab work for [place]. See previous notes. Per Richardson Dopp, PA states GI feels ok to resume Pradaxa with close f/u on Hgb. I was calling [facility] to see if any recent CBC has been done and if results could please be faxed to Richardson Dopp, [place] for review and pt's chart .

## 2016-10-31 NOTE — Progress Notes (Signed)
I tried to return Kennyth Lose from [facility] call from last week in regards to pt and recent lab work for [name]. See previous notes. Per Richardson Dopp, PA states GI feels ok to resume Pradaxa with close f/u on Hgb. I was calling [facility] to see if any recent CBC has been done and if results could please be faxed to Richardson Dopp, [place] for review and pt's chart .

## 2016-10-31 NOTE — Telephone Encounter (Signed)
-----   Message from Thayer Headings, MD sent at 10/30/2016  9:45 PM EDT ----- Creatinine is higher.    Change metalazone to 5 mg once a week  Recheck BMP in 1-2 months

## 2016-10-31 NOTE — Telephone Encounter (Signed)
Reviewed lab results with patient's sister, [name]. I advised her that per Dr. Acie Fredrickson, patient should only take metolazone 5 mg once per week. She states she will prepare his medications with this change and asked me to notify [facility]. I advised that I spoke with someone at [facility] who asked me to fax changes to [phone]. I advised patient is scheduled for lab appointment for repeat bmet on 11/15/16 at 10:00. I advised her to call back with questions or concerns. She thanked me for the call.

## 2016-11-02 ENCOUNTER — Ambulatory Visit (HOSPITAL_COMMUNITY)
Admission: RE | Admit: 2016-11-02 | Discharge: 2016-11-02 | Disposition: A | Discharge status: Home / Self Care | Payer: Medicare (Managed Care) | Source: Ambulatory Visit | Attending: Vascular Surgery | Admitting: Vascular Surgery

## 2016-11-02 ENCOUNTER — Encounter: Payer: Self-pay | Admitting: Vascular Surgery

## 2016-11-02 ENCOUNTER — Ambulatory Visit (INDEPENDENT_AMBULATORY_CARE_PROVIDER_SITE_OTHER): Payer: Medicare (Managed Care) | Admitting: Vascular Surgery

## 2016-11-02 VITALS — BP 129/68 | HR 60 | Temp 97.1°F | Resp 20 | Ht 60.0 in | Wt 196.0 lb

## 2016-11-02 DIAGNOSIS — I6523 Occlusion and stenosis of bilateral carotid arteries: Secondary | ICD-10-CM | POA: Diagnosis present

## 2016-11-02 NOTE — Progress Notes (Signed)
Patient name: [name] MRN: [id number] DOB: 28-Apr-1956 Sex: male  REASON FOR VISIT: Follow up of carotid disease.  HPI: [name] is a 61 y.o. male who underwent a combined left carotid endarterectomy and CABG in January 2017. He has a moderate 40-59% right carotid stenosis. When I saw him last the stenosis on the right was stable at 40-59%. He had a mild recurrent 40-59% left carotid stenosis. He comes in for six-month follow up visit.  Since I saw him last, he denies any history of stroke, TIAs, expressive or receptive aphasia, or amaurosis fugax. He does state that he occasionally has some following issues. He's also been told that his diabetes is starting to affect his eyes. He tells me that one of his physicians told him that he should stop taking aspirin.  He is on a statin.  Past Medical History:  Diagnosis Date  . Abdominal hernia    Chronic, not a good surgical candidate  . Abscess, abdomen ([place]) 12/31/2010   Referred to Wound Care in 01/2011 because of multiple abd abscess with VERY large ventral hernia (please look at image of CT abd/pelvis 09/2010).  Because of hernia I was hesitant to I&D.      Marland Kitchen Anemia    History of Iron Def Anemia  . Anxiety   . AVM (arteriovenous malformation)   . Carotid artery occlusion   . Chronic diastolic CHF (congestive heart failure) (HCC)    takes Lasix  . Chronic low back pain   . COPD (chronic obstructive pulmonary disease) ([place])   . CVA (cerebral infarction) 09/2010   Bilateral with Left > Right  . Diabetes mellitus   . GERD (gastroesophageal reflux disease)   . History of nuclear stress test    Myoview 9/16:  Inferior, apical and inf-lateral ischemia; not gated; High Risk  . Hx of echocardiogram    Echo 5/16:  Mild LVH, EF 55%, indeterm. diast function, WMA could not be ruled out, MAC, trivial MR, mild LAE, normal RVF //  b. Echo 4/17: EF 55-60%, no RWMA, Gr 2 DD, Ao sclerosis, MAC, mild MS, mild LAE, PASP 55 mHg  .  Hyperlipidemia   . Hypertension   . Intellectual disability    Sister helps to take care of him and takes him to appts  . Itching    all over body; pt scratches and has sores on bilateral arms and abdomen  . Lung nodule   . Myocardial infarction 2016 ?   Heart attack  (  Per  pt. )  . Obesity   . Oxygen dependent    wears 2 liters at bedtime and when needed  . PAF (paroxysmal atrial fibrillation) ([place]) 06/2009   CHADS score 2 (HTN, DM), was not on coumadin, but now on Pradaxa for Afib  . Pneumonia    hx of  . Shortness of breath dyspnea    with exertion  . Stroke ([place])   . Tobacco user    Smokes 1ppd for multiple years.  Quit after hosp 09/2010.  . Tubular adenoma of colon   . Wears glasses     Family History  Problem Relation Age of Onset  . Heart disease Mother   . Hypertension Sister   . Heart disease Brother   . Heart disease Father   . Peripheral vascular disease Father   . Heart disease Maternal Grandmother   . Heart attack Maternal Grandmother   . Breast cancer Maternal Grandmother   . Stomach  cancer Maternal Uncle   . Colon cancer Neg Hx   . Stroke Neg Hx     SOCIAL HISTORY: Social History  Substance Use Topics  . Smoking status: Former Smoker    Packs/day: 1.00    Years: 42.00    Types: Cigarettes, Pipe    Quit date: 11/10/2010  . Smokeless tobacco: Former Systems developer    Quit date: 10/02/2010  . Alcohol use No     Comment: " last drink was 2003" 08/11/15    Allergies  Allergen Reactions  . Penicillins Hives, Nausea And Vomiting, Swelling and Other (See Comments)    Has patient had a PCN reaction causing immediate rash, facial/tongue/throat swelling, SOB or lightheadedness with hypotension: YES Has patient had a PCN reaction causing severe rash involving mucus membranes or skin necrosis: No Has patient had a PCN reaction that required hospitalization No Has patient had a PCN reaction occurring within the last 10 years: No If all of the above answers are "NO",  then may proceed with Cephalosporin use.     Current Outpatient Prescriptions  Medication Sig Dispense Refill  . acetaminophen (TYLENOL) 325 MG tablet Take 650 mg by mouth 3 (three) times daily as needed for mild pain.    Marland Kitchen amLODipine (NORVASC) 5 MG tablet Take 5 mg by mouth daily.    Marland Kitchen atorvastatin (LIPITOR) 80 MG tablet Take 80 mg by mouth daily.    . calcium carbonate (TUMS - DOSED IN MG ELEMENTAL CALCIUM) 500 MG chewable tablet Chew 1 tablet by mouth 3 (three) times daily as needed for indigestion or heartburn.    . Cholecalciferol (VITAMIN D3) 1000 UNITS CAPS Take 1,000 Units by mouth daily.    . clonazePAM (KLONOPIN) 0.5 MG tablet Take 1 tablet (0.5 mg total) by mouth at bedtime. 30 tablet 0  . fludrocortisone (FLORINEF) 0.1 MG tablet Take 0.1 mg by mouth daily.    . furosemide (LASIX) 80 MG tablet Take 1.5 tablets (120 mg total) by mouth 2 (two) times daily.    Marland Kitchen gabapentin (NEURONTIN) 600 MG tablet Take 600 mg by mouth 2 (two) times daily.    Marland Kitchen HYDROcodone-acetaminophen (NORCO) 5-325 MG tablet Take 1 tablet by mouth daily.    . hydrocortisone (ANUSOL-HC) 2.5 % rectal cream Place 1 application rectally 3 (three) times daily as needed for hemorrhoids or itching.    . Insulin Glargine (LANTUS) 100 UNIT/ML Solostar Pen Inject 10 Units into the skin every evening.    Marland Kitchen ipratropium-albuterol (DUONEB) 0.5-2.5 (3) MG/3ML SOLN Take 3 mLs by nebulization 2 (two) times daily.    . metFORMIN (GLUCOPHAGE) 1000 MG tablet Take 1,000 mg by mouth 2 (two) times daily with a meal.    . metolazone (ZAROXOLYN) 5 MG tablet Take 1 tablet (5 mg total) by mouth once a week. 90 tablet 3  . metoprolol tartrate (LOPRESSOR) 25 MG tablet Take 25 mg by mouth 2 (two) times daily.    . nitroGLYCERIN (NITROSTAT) 0.4 MG SL tablet Place 0.4 mg under the tongue every 5 (five) minutes as needed for chest pain.    Marland Kitchen nystatin (MYCOSTATIN/NYSTOP) powder Apply topically 3 (three) times daily. 15 g 2  . pantoprazole (PROTONIX)  40 MG tablet Take 40 mg by mouth daily.    Marland Kitchen PARoxetine (PAXIL) 40 MG tablet Take 40 mg by mouth daily.     . polyvinyl alcohol (LIQUIFILM TEARS) 1.4 % ophthalmic solution Place 1 drop into both eyes 2 (two) times daily as needed for dry eyes. Natural Tears    .  potassium chloride SA (K-DUR,KLOR-CON) 20 MEQ tablet Take 1 tablet (20 mEq total) by mouth daily.    . sucralfate (CARAFATE) 1 GM/10ML suspension Take 10 mLs (1 g total) by mouth 4 (four) times daily -  with meals and at bedtime. 420 mL 0   No current facility-administered medications for this visit.     REVIEW OF SYSTEMS:  [X]  denotes positive finding, [ ]  denotes negative finding Cardiac  Comments:  Chest pain or chest pressure:    Shortness of breath upon exertion:    Short of breath when lying flat:    Irregular heart rhythm:        Vascular    Pain in calf, thigh, or hip brought on by ambulation:    Pain in feet at night that wakes you up from your sleep:     Blood clot in your veins:    Leg swelling:         Pulmonary    Oxygen at home:    Productive cough:     Wheezing:         Neurologic    Sudden weakness in arms or legs:     Sudden numbness in arms or legs:     Sudden onset of difficulty speaking or slurred speech:    Temporary loss of vision in one eye:     Problems with dizziness:         Gastrointestinal    Blood in stool:     Vomited blood:         Genitourinary    Burning when urinating:     Blood in urine:        Psychiatric    Major depression:         Hematologic    Bleeding problems:    Problems with blood clotting too easily:        Skin    Rashes or ulcers:        Constitutional    Fever or chills:      PHYSICAL EXAM: Vitals:   11/02/16 1025 11/02/16 1028  BP: 133/68 129/68  Pulse: 60   Resp: 20   Temp: 97.1 F (36.2 C)   TempSrc: Oral   SpO2: 93%   Weight: 196 lb (88.9 kg)   Height: 5' (1.524 m)     GENERAL: The patient is a well-nourished male, in no acute distress.  The vital signs are documented above. CARDIAC: There is a regular rate and rhythm.  VASCULAR: I do not detect carotid bruits.  PULMONARY: There is good air exchange bilaterally without wheezing or rales. ABDOMEN: Soft and non-tender with normal pitched bowel sounds.  MUSCULOSKELETAL: There are no major deformities or cyanosis. NEUROLOGIC: No focal weakness or paresthesias are detected. SKIN: There are no ulcers or rashes noted. PSYCHIATRIC: The patient has a normal affect.  DATA:   CAROTID DUPLEX: I have independently interpreted his carotid duplex scan that was done today.  On the right side, there is a 60-79% stenosis. Thus the velocities have increased on the right.  On the left side, he does have a recurrent 60-79% stenosis in the distal end of the patch.   Both vertebral arteries are patent with antegrade flow.  MEDICAL ISSUES:  ASYMPTOMATIC BILATERAL 60-79% CAROTID STENOSES: He has a recurrent 60-79% left carotid stenosis and also a 60-79% right carotid stenosis. He is asymptomatic. He understands we would not consider carotid endarterectomy was a stenosis progressed to greater than 80%. He would be at  increased risk for surgery given his obesity and poorly controlled diabetes. If he develops progressive stenosis on the left he could be considered for carotid stent given these had previous surgery here. I have recommended that he continue taking aspirin. I will order to follow carotid duplex scan in 6 months and will see him back at that time. He knows to call sooner if he has problems.   [name] [facility] [phone]

## 2016-11-15 ENCOUNTER — Other Ambulatory Visit: Payer: Medicare (Managed Care)

## 2016-12-08 ENCOUNTER — Other Ambulatory Visit (HOSPITAL_COMMUNITY): Payer: Self-pay | Admitting: *Deleted

## 2016-12-09 ENCOUNTER — Ambulatory Visit (HOSPITAL_COMMUNITY)
Admission: RE | Admit: 2016-12-09 | Discharge: 2016-12-09 | Disposition: A | Discharge status: Home / Self Care | Payer: Medicare (Managed Care) | Source: Ambulatory Visit | Attending: Family Medicine | Admitting: Family Medicine

## 2016-12-09 DIAGNOSIS — D500 Iron deficiency anemia secondary to blood loss (chronic): Secondary | ICD-10-CM | POA: Diagnosis present

## 2016-12-09 MED ORDER — FERUMOXYTOL INJECTION 510 MG/17 ML
510.0000 mg | INTRAVENOUS | Status: DC
  Administered 2016-12-09: 12:00:00 510 mg via INTRAVENOUS
  Filled 2016-12-09: qty 17

## 2016-12-14 ENCOUNTER — Other Ambulatory Visit (HOSPITAL_COMMUNITY): Payer: Self-pay | Admitting: *Deleted

## 2016-12-16 ENCOUNTER — Ambulatory Visit (HOSPITAL_COMMUNITY)
Admission: RE | Admit: 2016-12-16 | Discharge: 2016-12-16 | Disposition: A | Discharge status: Home / Self Care | Payer: Medicare (Managed Care) | Source: Ambulatory Visit | Attending: Family Medicine | Admitting: Family Medicine

## 2016-12-16 DIAGNOSIS — D509 Iron deficiency anemia, unspecified: Secondary | ICD-10-CM | POA: Insufficient documentation

## 2016-12-16 MED ORDER — SODIUM CHLORIDE 0.9 % IV SOLN
510.0000 mg | Freq: Once | INTRAVENOUS | Status: AC
  Administered 2016-12-16: 510 mg via INTRAVENOUS
  Filled 2016-12-16: qty 17

## 2017-01-06 ENCOUNTER — Encounter: Payer: Self-pay | Admitting: Physician Assistant

## 2017-03-22 ENCOUNTER — Other Ambulatory Visit (HOSPITAL_COMMUNITY): Payer: Self-pay | Admitting: *Deleted

## 2017-03-23 ENCOUNTER — Encounter (HOSPITAL_COMMUNITY)
Admission: RE | Admit: 2017-03-23 | Discharge: 2017-03-23 | Disposition: A | Discharge status: Home / Self Care | Payer: Medicare (Managed Care) | Source: Ambulatory Visit | Attending: Family Medicine | Admitting: Family Medicine

## 2017-03-23 DIAGNOSIS — D509 Iron deficiency anemia, unspecified: Secondary | ICD-10-CM | POA: Diagnosis not present

## 2017-03-23 MED ORDER — SODIUM CHLORIDE 0.9 % IV SOLN
510.0000 mg | INTRAVENOUS | Status: DC
  Administered 2017-03-23: 11:00:00 510 mg via INTRAVENOUS
  Filled 2017-03-23: qty 17

## 2017-03-29 ENCOUNTER — Other Ambulatory Visit (HOSPITAL_COMMUNITY): Payer: Self-pay | Admitting: *Deleted

## 2017-03-30 ENCOUNTER — Encounter (HOSPITAL_COMMUNITY)
Admission: RE | Admit: 2017-03-30 | Discharge: 2017-03-30 | Disposition: A | Discharge status: Home / Self Care | Payer: Medicare (Managed Care) | Source: Ambulatory Visit | Attending: Family Medicine | Admitting: Family Medicine

## 2017-03-30 DIAGNOSIS — D509 Iron deficiency anemia, unspecified: Secondary | ICD-10-CM | POA: Diagnosis not present

## 2017-03-30 MED ORDER — SODIUM CHLORIDE 0.9 % IV SOLN
510.0000 mg | Freq: Once | INTRAVENOUS | Status: AC
  Administered 2017-03-30: 11:00:00 510 mg via INTRAVENOUS
  Filled 2017-03-30: qty 17

## 2017-05-02 ENCOUNTER — Other Ambulatory Visit: Payer: Self-pay

## 2017-05-02 DIAGNOSIS — I779 Disorder of arteries and arterioles, unspecified: Secondary | ICD-10-CM

## 2017-05-02 DIAGNOSIS — I739 Peripheral vascular disease, unspecified: Principal | ICD-10-CM

## 2017-05-10 ENCOUNTER — Ambulatory Visit: Payer: Medicare (Managed Care) | Admitting: Vascular Surgery

## 2017-05-10 ENCOUNTER — Encounter (HOSPITAL_COMMUNITY): Payer: Medicare (Managed Care)

## 2017-06-21 ENCOUNTER — Other Ambulatory Visit: Payer: Self-pay

## 2017-06-21 ENCOUNTER — Ambulatory Visit (INDEPENDENT_AMBULATORY_CARE_PROVIDER_SITE_OTHER): Payer: Medicare (Managed Care) | Admitting: Vascular Surgery

## 2017-06-21 ENCOUNTER — Ambulatory Visit (HOSPITAL_COMMUNITY)
Admission: RE | Admit: 2017-06-21 | Discharge: 2017-06-21 | Disposition: A | Discharge status: Home / Self Care | Payer: Medicare (Managed Care) | Source: Ambulatory Visit | Attending: Vascular Surgery | Admitting: Vascular Surgery

## 2017-06-21 ENCOUNTER — Encounter: Payer: Self-pay | Admitting: Vascular Surgery

## 2017-06-21 VITALS — BP 115/75 | HR 58 | Temp 97.0°F | Resp 16 | Ht 60.0 in | Wt 168.0 lb

## 2017-06-21 DIAGNOSIS — I739 Peripheral vascular disease, unspecified: Secondary | ICD-10-CM

## 2017-06-21 DIAGNOSIS — T82858A Stenosis of vascular prosthetic devices, implants and grafts, initial encounter: Secondary | ICD-10-CM | POA: Diagnosis not present

## 2017-06-21 DIAGNOSIS — Y838 Other surgical procedures as the cause of abnormal reaction of the patient, or of later complication, without mention of misadventure at the time of the procedure: Secondary | ICD-10-CM | POA: Insufficient documentation

## 2017-06-21 DIAGNOSIS — I779 Disorder of arteries and arterioles, unspecified: Secondary | ICD-10-CM | POA: Insufficient documentation

## 2017-06-21 DIAGNOSIS — I6523 Occlusion and stenosis of bilateral carotid arteries: Secondary | ICD-10-CM | POA: Diagnosis not present

## 2017-06-21 LAB — VAS US CAROTID
LCCAPDIAS: 10 cm/s
LCCAPSYS: 53 cm/s
LEFT ECA DIAS: -8 cm/s
Left CCA dist dias: 9 cm/s
Left CCA dist sys: 50 cm/s
Left ICA dist dias: -29 cm/s
Left ICA dist sys: -88 cm/s
Left ICA prox dias: -45 cm/s
Left ICA prox sys: -153 cm/s
RCCAPDIAS: 12 cm/s
RIGHT CCA MID DIAS: -11 cm/s
RIGHT ECA DIAS: -24 cm/s
Right CCA prox sys: 62 cm/s
Right cca dist sys: -109 cm/s

## 2017-06-21 NOTE — Progress Notes (Signed)
History of Present Illness:  Patient is a 61 y.o. year old male who presents for surveillance follow up of carotid stenosis.  He underwent a combined left carotid endarterectomy and CABG in January 2017.The patient denies symptoms of TIA, amaurosis, or stroke.  The carotid stenosis was found pre-op work up for CABG.  Other medical problems include HTN, hyperlipidemia, COPD, CAD.  These are currently stable.  Past Medical History:  Diagnosis Date  . Abdominal hernia    Chronic, not a good surgical candidate  . Abscess, abdomen 12/31/2010   Referred to Wound Care in 01/2011 because of multiple abd abscess with VERY large ventral hernia (please look at image of CT abd/pelvis 09/2010).  Because of hernia I was hesitant to I&D.      Marland Kitchen Anemia    History of Iron Def Anemia  . Anxiety   . AVM (arteriovenous malformation)    chronic GI blood loss  . Carotid artery disease ([place])    s/p CEA  . Chronic diastolic CHF (congestive heart failure) (HCC)    takes Lasix  . Chronic low back pain   . COPD (chronic obstructive pulmonary disease) ([place])   . CVA (cerebral infarction) 09/2010   Bilateral with Left > Right  . Diabetes mellitus   . GERD (gastroesophageal reflux disease)   . History of nuclear stress test    Myoview 9/16:  Inferior, apical and inf-lateral ischemia; not gated; High Risk  . Hx of echocardiogram    Echo 5/16:  Mild LVH, EF 55%, indeterm. diast function, WMA could not be ruled out, MAC, trivial MR, mild LAE, normal RVF //  b. Echo 4/17: EF 55-60%, no RWMA, Gr 2 DD, Ao sclerosis, MAC, mild MS, mild LAE, PASP 55 mHg  . Hyperlipidemia   . Hypertension   . Intellectual disability    Sister helps to take care of him and takes him to appts  . Itching    all over body; pt scratches and has sores on bilateral arms and abdomen  . Lung nodule   . Myocardial infarction [facility]) 2016 ?   Heart attack  (  Per  pt. )  . Obesity   . Oxygen dependent    wears 2 liters at bedtime and when  needed  . PAF (paroxysmal atrial fibrillation) ([place]) 06/2009   CHADS score 2 (HTN, DM) // Pradaxa for Afib // Pradaxa stopped due to worsening anemia (Hgb in 5/18 8.5 >> Pradaxa remains on hold)   . Pneumonia    hx of  . Stroke ([place])   . Tobacco user    Smokes 1ppd for multiple years.  Quit after hosp 09/2010.  . Tubular adenoma of colon   . Wears glasses     Past Surgical History:  Procedure Laterality Date  . arm surgery Left    as a child  . Carotid arteriogram  10/2010   30% right ICA stenosis, 40% left ICA stenosis   . CAROTID ENDARTERECTOMY Left 08-13-15   CEA  . CATARACT EXTRACTION, BILATERAL    . DEBRIDMENT OF DECUBITUS ULCER Right 02/13/2013  . MULTIPLE TOOTH EXTRACTIONS    . TRANSESOPHAGEAL ECHOCARDIOGRAM  09/2010   No ASD or PFO. EF 60-65%.  Normal systolic function. No evidence of thrombus.   . TRANSTHORACIC ECHOCARDIOGRAM  09/2010    The cavity size was normal. Systolic function was vigorous.  EF 65-70%.  Normal wall funciton.      Social History Social History  Tobacco Use  . Smoking status: Former Smoker    Packs/day: 1.00    Years: 42.00    Pack years: 42.00    Types: Cigarettes, Pipe    Last attempt to quit: 11/10/2010    Years since quitting: 6.6  . Smokeless tobacco: Former Systems developer    Quit date: 10/02/2010  Substance Use Topics  . Alcohol use: No    Alcohol/week: 0.0 oz    Comment: " last drink was 2003" 08/11/15  . Drug use: No    Family History Family History  Problem Relation Age of Onset  . Heart disease Mother   . Hypertension Sister   . Heart disease Brother   . Heart disease Father   . Peripheral vascular disease Father   . Heart disease Maternal Grandmother   . Heart attack Maternal Grandmother   . Breast cancer Maternal Grandmother   . Stomach cancer Maternal Uncle   . Colon cancer Neg Hx   . Stroke Neg Hx     Allergies  Allergies  Allergen Reactions  . Penicillins Hives, Nausea And Vomiting, Swelling and Other (See Comments)     Has patient had a PCN reaction causing immediate rash, facial/tongue/throat swelling, SOB or lightheadedness with hypotension: YES Has patient had a PCN reaction causing severe rash involving mucus membranes or skin necrosis: No Has patient had a PCN reaction that required hospitalization No Has patient had a PCN reaction occurring within the last 10 years: No If all of the above answers are "NO", then may proceed with Cephalosporin use.      Current Outpatient Medications  Medication Sig Dispense Refill  . acetaminophen (TYLENOL) 325 MG tablet Take 650 mg by mouth 3 (three) times daily as needed for mild pain.    Marland Kitchen amLODipine (NORVASC) 5 MG tablet Take 5 mg by mouth daily.    Marland Kitchen atorvastatin (LIPITOR) 80 MG tablet Take 80 mg by mouth daily.    . calcium carbonate (TUMS - DOSED IN MG ELEMENTAL CALCIUM) 500 MG chewable tablet Chew 1 tablet by mouth 3 (three) times daily as needed for indigestion or heartburn.    . Cholecalciferol (VITAMIN D3) 1000 UNITS CAPS Take 1,000 Units by mouth daily.    . clonazePAM (KLONOPIN) 0.5 MG tablet Take 1 tablet (0.5 mg total) by mouth at bedtime. 30 tablet 0  . fludrocortisone (FLORINEF) 0.1 MG tablet Take 0.1 mg by mouth daily.    Marland Kitchen gabapentin (NEURONTIN) 600 MG tablet Take 600 mg by mouth 2 (two) times daily.    Marland Kitchen HYDROcodone-acetaminophen (NORCO) 5-325 MG tablet Take 1 tablet by mouth daily.    . hydrocortisone (ANUSOL-HC) 2.5 % rectal cream Place 1 application rectally 3 (three) times daily as needed for hemorrhoids or itching.    . Insulin Glargine (LANTUS) 100 UNIT/ML Solostar Pen Inject 10 Units into the skin every evening.    Marland Kitchen ipratropium-albuterol (DUONEB) 0.5-2.5 (3) MG/3ML SOLN Take 3 mLs by nebulization 2 (two) times daily.    . metFORMIN (GLUCOPHAGE) 1000 MG tablet Take 1,000 mg by mouth 2 (two) times daily with a meal.    . metoprolol tartrate (LOPRESSOR) 25 MG tablet Take 25 mg by mouth 2 (two) times daily.    . nitroGLYCERIN (NITROSTAT) 0.4 MG  SL tablet Place 0.4 mg under the tongue every 5 (five) minutes as needed for chest pain.    Marland Kitchen nystatin (MYCOSTATIN/NYSTOP) powder Apply topically 3 (three) times daily. 15 g 2  . pantoprazole (PROTONIX) 40 MG tablet Take 40 mg  by mouth daily.    Marland Kitchen PARoxetine (PAXIL) 40 MG tablet Take 40 mg by mouth daily.     . polyvinyl alcohol (LIQUIFILM TEARS) 1.4 % ophthalmic solution Place 1 drop into both eyes 2 (two) times daily as needed for dry eyes. Natural Tears    . potassium chloride SA (K-DUR,KLOR-CON) 20 MEQ tablet Take 1 tablet (20 mEq total) by mouth daily.    . sucralfate (CARAFATE) 1 GM/10ML suspension Take 10 mLs (1 g total) by mouth 4 (four) times daily -  with meals and at bedtime. 420 mL 0  . furosemide (LASIX) 80 MG tablet Take 1.5 tablets (120 mg total) by mouth 2 (two) times daily.    . metolazone (ZAROXOLYN) 5 MG tablet Take 1 tablet (5 mg total) by mouth once a week. 90 tablet 3   No current facility-administered medications for this visit.     ROS:   General:  No weight loss, Fever, chills  HEENT: No recent headaches, no nasal bleeding, no visual changes, no sore throat  Neurologic: No dizziness, blackouts, seizures. No recent symptoms of stroke or mini- stroke. No recent episodes of slurred speech, or temporary blindness.  Cardiac: No recent episodes of chest pain/pressure, no shortness of breath at rest.   shortness of breath with exertion.  Denies history of atrial fibrillation or irregular heartbeat  Vascular: No history of rest pain in feet.  No history of claudication.  No history of non-healing ulcer, No history of DVT   Pulmonary: No home oxygen, no productive cough, no hemoptysis,  No asthma or wheezing  Musculoskeletal:  _0  Arthritis, _1  Low back pain,  [x ] Joint pain  Hematologic:No history of hypercoagulable state.  No history of easy bleeding.  No history of anemia  Gastrointestinal: No hematochezia or melena,  No gastroesophageal reflux, no trouble  swallowing  Urinary: _2  chronic Kidney disease, _3  on HD - _4  MWF or _5  TTHS, _6  Burning with urination, _7  Frequent urination, _8  Difficulty urinating;   Skin: multiple skin lesions due to dry skin irritation    Psychological: No history of anxiety,  No history of depression   Physical Examination  Vitals:   06/21/17 1254 06/21/17 1259  BP: 119/69 115/75  Pulse: (!) 58 (!) 58  Resp: 16   Temp: (!) 97 F (36.1 C)   TempSrc: Oral   SpO2: 98%   Weight: 168 lb (76.2 kg)   Height: 5' (1.524 m)     Body mass index is 32.81 kg/m.  General:  Alert and oriented, no acute distress HEENT: Normal Neck: No bruit or JVD Pulmonary: Clear to auscultation bilaterally Cardiac: Regular Rate and Rhythm without murmur Gastrointestinal: Soft, non-tender, non-distended, no mass, no scars Skin: No rash Extremity Pulses:  2+ radial pulses bilaterally, LE B are dressing to protec his skin Musculoskeletal: No deformity or edema  Neurologic: Upper and lower extremity motor 5/5 and symmetric  DATA:  Carotid Duplex Right 60-79% Left 40-59% restenosis distal patch   ASSESSMENT:  Carotid stenosis B S/P left CEA 08/13/2015   PLAN: He has evidence of restenosis on the left distal to the patch 40-59% which is an improvement from his previous duplex.  He remains on the lower end of 60-79% stenosis on the right.  He is stable and reporting no signs or symptoms of stroke.  We will schedule him a repeat duplex in 6 months.     Theda Sers, Darragh Nay [facility] PA-C [facility]  The patient was seen in conjunction with Dr. Scot Dock today

## 2017-06-22 NOTE — Addendum Note (Signed)
Addended by: Lianne Cure A on: 06/22/2017 03:45 PM   Modules accepted: Orders

## 2017-09-15 ENCOUNTER — Inpatient Hospital Stay (HOSPITAL_COMMUNITY)
Admission: EM | Admit: 2017-09-15 | Discharge: 2017-10-26 | DRG: 308 | Disposition: A | Discharge status: Skilled Nursing Facility | Payer: Medicare (Managed Care) | Attending: Internal Medicine | Admitting: Internal Medicine

## 2017-09-15 ENCOUNTER — Other Ambulatory Visit: Payer: Self-pay

## 2017-09-15 ENCOUNTER — Encounter (HOSPITAL_COMMUNITY)
Admission: EM | Disposition: A | Discharge status: Skilled Nursing Facility | Payer: Self-pay | Source: Home / Self Care | Attending: Internal Medicine

## 2017-09-15 ENCOUNTER — Emergency Department (HOSPITAL_COMMUNITY): Payer: Medicare (Managed Care)

## 2017-09-15 ENCOUNTER — Encounter (HOSPITAL_COMMUNITY): Payer: Self-pay | Admitting: Emergency Medicine

## 2017-09-15 DIAGNOSIS — F1310 Sedative, hypnotic or anxiolytic abuse, uncomplicated: Secondary | ICD-10-CM | POA: Diagnosis not present

## 2017-09-15 DIAGNOSIS — J1100 Influenza due to unidentified influenza virus with unspecified type of pneumonia: Secondary | ICD-10-CM | POA: Diagnosis not present

## 2017-09-15 DIAGNOSIS — I2510 Atherosclerotic heart disease of native coronary artery without angina pectoris: Secondary | ICD-10-CM | POA: Diagnosis present

## 2017-09-15 DIAGNOSIS — R10814 Left lower quadrant abdominal tenderness: Secondary | ICD-10-CM | POA: Diagnosis not present

## 2017-09-15 DIAGNOSIS — R1310 Dysphagia, unspecified: Secondary | ICD-10-CM | POA: Diagnosis not present

## 2017-09-15 DIAGNOSIS — Z8701 Personal history of pneumonia (recurrent): Secondary | ICD-10-CM | POA: Diagnosis not present

## 2017-09-15 DIAGNOSIS — F419 Anxiety disorder, unspecified: Secondary | ICD-10-CM | POA: Diagnosis present

## 2017-09-15 DIAGNOSIS — J9600 Acute respiratory failure, unspecified whether with hypoxia or hypercapnia: Secondary | ICD-10-CM | POA: Diagnosis not present

## 2017-09-15 DIAGNOSIS — L304 Erythema intertrigo: Secondary | ICD-10-CM | POA: Diagnosis present

## 2017-09-15 DIAGNOSIS — Z6833 Body mass index (BMI) 33.0-33.9, adult: Secondary | ICD-10-CM

## 2017-09-15 DIAGNOSIS — Z45018 Encounter for adjustment and management of other part of cardiac pacemaker: Secondary | ICD-10-CM

## 2017-09-15 DIAGNOSIS — J9811 Atelectasis: Secondary | ICD-10-CM | POA: Diagnosis not present

## 2017-09-15 DIAGNOSIS — I481 Persistent atrial fibrillation: Secondary | ICD-10-CM | POA: Diagnosis not present

## 2017-09-15 DIAGNOSIS — Z01818 Encounter for other preprocedural examination: Secondary | ICD-10-CM

## 2017-09-15 DIAGNOSIS — I5043 Acute on chronic combined systolic (congestive) and diastolic (congestive) heart failure: Secondary | ICD-10-CM | POA: Diagnosis not present

## 2017-09-15 DIAGNOSIS — Z992 Dependence on renal dialysis: Secondary | ICD-10-CM | POA: Diagnosis not present

## 2017-09-15 DIAGNOSIS — I480 Paroxysmal atrial fibrillation: Secondary | ICD-10-CM | POA: Diagnosis present

## 2017-09-15 DIAGNOSIS — Z95828 Presence of other vascular implants and grafts: Secondary | ICD-10-CM

## 2017-09-15 DIAGNOSIS — N179 Acute kidney failure, unspecified: Secondary | ICD-10-CM | POA: Diagnosis not present

## 2017-09-15 DIAGNOSIS — Z87891 Personal history of nicotine dependence: Secondary | ICD-10-CM

## 2017-09-15 DIAGNOSIS — R570 Cardiogenic shock: Secondary | ICD-10-CM | POA: Diagnosis not present

## 2017-09-15 DIAGNOSIS — R197 Diarrhea, unspecified: Secondary | ICD-10-CM | POA: Diagnosis not present

## 2017-09-15 DIAGNOSIS — R011 Cardiac murmur, unspecified: Secondary | ICD-10-CM | POA: Diagnosis not present

## 2017-09-15 DIAGNOSIS — N183 Chronic kidney disease, stage 3 (moderate): Secondary | ICD-10-CM | POA: Diagnosis present

## 2017-09-15 DIAGNOSIS — I4510 Unspecified right bundle-branch block: Secondary | ICD-10-CM | POA: Diagnosis not present

## 2017-09-15 DIAGNOSIS — Z09 Encounter for follow-up examination after completed treatment for conditions other than malignant neoplasm: Secondary | ICD-10-CM

## 2017-09-15 DIAGNOSIS — R579 Shock, unspecified: Secondary | ICD-10-CM | POA: Diagnosis present

## 2017-09-15 DIAGNOSIS — A419 Sepsis, unspecified organism: Secondary | ICD-10-CM | POA: Diagnosis not present

## 2017-09-15 DIAGNOSIS — I471 Supraventricular tachycardia: Secondary | ICD-10-CM | POA: Diagnosis not present

## 2017-09-15 DIAGNOSIS — I130 Hypertensive heart and chronic kidney disease with heart failure and stage 1 through stage 4 chronic kidney disease, or unspecified chronic kidney disease: Secondary | ICD-10-CM | POA: Diagnosis present

## 2017-09-15 DIAGNOSIS — Z9289 Personal history of other medical treatment: Secondary | ICD-10-CM

## 2017-09-15 DIAGNOSIS — R6520 Severe sepsis without septic shock: Secondary | ICD-10-CM | POA: Diagnosis not present

## 2017-09-15 DIAGNOSIS — I440 Atrioventricular block, first degree: Secondary | ICD-10-CM | POA: Diagnosis not present

## 2017-09-15 DIAGNOSIS — Z79899 Other long term (current) drug therapy: Secondary | ICD-10-CM | POA: Diagnosis not present

## 2017-09-15 DIAGNOSIS — D649 Anemia, unspecified: Secondary | ICD-10-CM | POA: Diagnosis not present

## 2017-09-15 DIAGNOSIS — E1165 Type 2 diabetes mellitus with hyperglycemia: Secondary | ICD-10-CM | POA: Diagnosis not present

## 2017-09-15 DIAGNOSIS — I6522 Occlusion and stenosis of left carotid artery: Secondary | ICD-10-CM | POA: Diagnosis not present

## 2017-09-15 DIAGNOSIS — I10 Essential (primary) hypertension: Secondary | ICD-10-CM | POA: Diagnosis present

## 2017-09-15 DIAGNOSIS — Z01810 Encounter for preprocedural cardiovascular examination: Secondary | ICD-10-CM | POA: Diagnosis not present

## 2017-09-15 DIAGNOSIS — R600 Localized edema: Secondary | ICD-10-CM | POA: Diagnosis not present

## 2017-09-15 DIAGNOSIS — J9601 Acute respiratory failure with hypoxia: Secondary | ICD-10-CM | POA: Diagnosis present

## 2017-09-15 DIAGNOSIS — R451 Restlessness and agitation: Secondary | ICD-10-CM | POA: Diagnosis not present

## 2017-09-15 DIAGNOSIS — R339 Retention of urine, unspecified: Secondary | ICD-10-CM | POA: Diagnosis not present

## 2017-09-15 DIAGNOSIS — E8779 Other fluid overload: Secondary | ICD-10-CM | POA: Diagnosis not present

## 2017-09-15 DIAGNOSIS — N189 Chronic kidney disease, unspecified: Secondary | ICD-10-CM | POA: Diagnosis not present

## 2017-09-15 DIAGNOSIS — J159 Unspecified bacterial pneumonia: Secondary | ICD-10-CM | POA: Diagnosis not present

## 2017-09-15 DIAGNOSIS — E874 Mixed disorder of acid-base balance: Secondary | ICD-10-CM | POA: Diagnosis not present

## 2017-09-15 DIAGNOSIS — R0902 Hypoxemia: Secondary | ICD-10-CM

## 2017-09-15 DIAGNOSIS — J449 Chronic obstructive pulmonary disease, unspecified: Secondary | ICD-10-CM | POA: Diagnosis present

## 2017-09-15 DIAGNOSIS — J80 Acute respiratory distress syndrome: Secondary | ICD-10-CM | POA: Diagnosis not present

## 2017-09-15 DIAGNOSIS — I129 Hypertensive chronic kidney disease with stage 1 through stage 4 chronic kidney disease, or unspecified chronic kidney disease: Secondary | ICD-10-CM | POA: Diagnosis not present

## 2017-09-15 DIAGNOSIS — J111 Influenza due to unidentified influenza virus with other respiratory manifestations: Secondary | ICD-10-CM | POA: Diagnosis not present

## 2017-09-15 DIAGNOSIS — Z8249 Family history of ischemic heart disease and other diseases of the circulatory system: Secondary | ICD-10-CM

## 2017-09-15 DIAGNOSIS — E785 Hyperlipidemia, unspecified: Secondary | ICD-10-CM | POA: Diagnosis present

## 2017-09-15 DIAGNOSIS — E876 Hypokalemia: Secondary | ICD-10-CM | POA: Diagnosis present

## 2017-09-15 DIAGNOSIS — N471 Phimosis: Secondary | ICD-10-CM | POA: Diagnosis present

## 2017-09-15 DIAGNOSIS — L280 Lichen simplex chronicus: Secondary | ICD-10-CM | POA: Diagnosis present

## 2017-09-15 DIAGNOSIS — J09X2 Influenza due to identified novel influenza A virus with other respiratory manifestations: Secondary | ICD-10-CM | POA: Diagnosis not present

## 2017-09-15 DIAGNOSIS — G3184 Mild cognitive impairment, so stated: Secondary | ICD-10-CM | POA: Diagnosis not present

## 2017-09-15 DIAGNOSIS — L89152 Pressure ulcer of sacral region, stage 2: Secondary | ICD-10-CM | POA: Diagnosis not present

## 2017-09-15 DIAGNOSIS — I255 Ischemic cardiomyopathy: Secondary | ICD-10-CM | POA: Diagnosis not present

## 2017-09-15 DIAGNOSIS — J1000 Influenza due to other identified influenza virus with unspecified type of pneumonia: Secondary | ICD-10-CM | POA: Diagnosis not present

## 2017-09-15 DIAGNOSIS — G9341 Metabolic encephalopathy: Secondary | ICD-10-CM | POA: Diagnosis not present

## 2017-09-15 DIAGNOSIS — E2740 Unspecified adrenocortical insufficiency: Secondary | ICD-10-CM | POA: Diagnosis present

## 2017-09-15 DIAGNOSIS — N35911 Unspecified urethral stricture, male, meatal: Secondary | ICD-10-CM | POA: Diagnosis present

## 2017-09-15 DIAGNOSIS — Z9981 Dependence on supplemental oxygen: Secondary | ICD-10-CM

## 2017-09-15 DIAGNOSIS — N170 Acute kidney failure with tubular necrosis: Secondary | ICD-10-CM | POA: Diagnosis not present

## 2017-09-15 DIAGNOSIS — I959 Hypotension, unspecified: Secondary | ICD-10-CM | POA: Diagnosis present

## 2017-09-15 DIAGNOSIS — J1008 Influenza due to other identified influenza virus with other specified pneumonia: Secondary | ICD-10-CM | POA: Diagnosis not present

## 2017-09-15 DIAGNOSIS — E871 Hypo-osmolality and hyponatremia: Secondary | ICD-10-CM | POA: Diagnosis not present

## 2017-09-15 DIAGNOSIS — J9690 Respiratory failure, unspecified, unspecified whether with hypoxia or hypercapnia: Secondary | ICD-10-CM | POA: Diagnosis not present

## 2017-09-15 DIAGNOSIS — R0600 Dyspnea, unspecified: Secondary | ICD-10-CM

## 2017-09-15 DIAGNOSIS — J690 Pneumonitis due to inhalation of food and vomit: Secondary | ICD-10-CM | POA: Diagnosis present

## 2017-09-15 DIAGNOSIS — Z789 Other specified health status: Secondary | ICD-10-CM

## 2017-09-15 DIAGNOSIS — R0682 Tachypnea, not elsewhere classified: Secondary | ICD-10-CM

## 2017-09-15 DIAGNOSIS — K439 Ventral hernia without obstruction or gangrene: Secondary | ICD-10-CM | POA: Diagnosis present

## 2017-09-15 DIAGNOSIS — I455 Other specified heart block: Secondary | ICD-10-CM | POA: Diagnosis not present

## 2017-09-15 DIAGNOSIS — L89153 Pressure ulcer of sacral region, stage 3: Secondary | ICD-10-CM | POA: Diagnosis present

## 2017-09-15 DIAGNOSIS — Z8673 Personal history of transient ischemic attack (TIA), and cerebral infarction without residual deficits: Secondary | ICD-10-CM

## 2017-09-15 DIAGNOSIS — I495 Sick sinus syndrome: Principal | ICD-10-CM

## 2017-09-15 DIAGNOSIS — R6521 Severe sepsis with septic shock: Secondary | ICD-10-CM | POA: Diagnosis not present

## 2017-09-15 DIAGNOSIS — I509 Heart failure, unspecified: Secondary | ICD-10-CM | POA: Diagnosis not present

## 2017-09-15 DIAGNOSIS — R079 Chest pain, unspecified: Secondary | ICD-10-CM | POA: Diagnosis not present

## 2017-09-15 DIAGNOSIS — E875 Hyperkalemia: Secondary | ICD-10-CM | POA: Diagnosis not present

## 2017-09-15 DIAGNOSIS — E1122 Type 2 diabetes mellitus with diabetic chronic kidney disease: Secondary | ICD-10-CM | POA: Diagnosis not present

## 2017-09-15 DIAGNOSIS — I5020 Unspecified systolic (congestive) heart failure: Secondary | ICD-10-CM | POA: Diagnosis not present

## 2017-09-15 DIAGNOSIS — Z452 Encounter for adjustment and management of vascular access device: Secondary | ICD-10-CM

## 2017-09-15 DIAGNOSIS — J811 Chronic pulmonary edema: Secondary | ICD-10-CM

## 2017-09-15 DIAGNOSIS — Z880 Allergy status to penicillin: Secondary | ICD-10-CM

## 2017-09-15 DIAGNOSIS — D638 Anemia in other chronic diseases classified elsewhere: Secondary | ICD-10-CM

## 2017-09-15 DIAGNOSIS — F79 Unspecified intellectual disabilities: Secondary | ICD-10-CM | POA: Diagnosis present

## 2017-09-15 DIAGNOSIS — K219 Gastro-esophageal reflux disease without esophagitis: Secondary | ICD-10-CM | POA: Diagnosis present

## 2017-09-15 DIAGNOSIS — I110 Hypertensive heart disease with heart failure: Secondary | ICD-10-CM | POA: Diagnosis not present

## 2017-09-15 DIAGNOSIS — IMO0002 Reserved for concepts with insufficient information to code with codable children: Secondary | ICD-10-CM | POA: Diagnosis present

## 2017-09-15 DIAGNOSIS — N185 Chronic kidney disease, stage 5: Secondary | ICD-10-CM | POA: Diagnosis not present

## 2017-09-15 DIAGNOSIS — Z951 Presence of aortocoronary bypass graft: Secondary | ICD-10-CM

## 2017-09-15 DIAGNOSIS — E8770 Fluid overload, unspecified: Secondary | ICD-10-CM

## 2017-09-15 DIAGNOSIS — R10813 Right lower quadrant abdominal tenderness: Secondary | ICD-10-CM | POA: Diagnosis not present

## 2017-09-15 DIAGNOSIS — J440 Chronic obstructive pulmonary disease with acute lower respiratory infection: Secondary | ICD-10-CM | POA: Diagnosis present

## 2017-09-15 DIAGNOSIS — E861 Hypovolemia: Secondary | ICD-10-CM | POA: Diagnosis present

## 2017-09-15 DIAGNOSIS — E11622 Type 2 diabetes mellitus with other skin ulcer: Secondary | ICD-10-CM | POA: Diagnosis not present

## 2017-09-15 DIAGNOSIS — R6889 Other general symptoms and signs: Secondary | ICD-10-CM

## 2017-09-15 DIAGNOSIS — I420 Dilated cardiomyopathy: Secondary | ICD-10-CM | POA: Diagnosis present

## 2017-09-15 DIAGNOSIS — I25119 Atherosclerotic heart disease of native coronary artery with unspecified angina pectoris: Secondary | ICD-10-CM | POA: Diagnosis present

## 2017-09-15 DIAGNOSIS — D631 Anemia in chronic kidney disease: Secondary | ICD-10-CM | POA: Diagnosis not present

## 2017-09-15 DIAGNOSIS — R110 Nausea: Secondary | ICD-10-CM | POA: Diagnosis not present

## 2017-09-15 DIAGNOSIS — R34 Anuria and oliguria: Secondary | ICD-10-CM | POA: Diagnosis not present

## 2017-09-15 DIAGNOSIS — I5042 Chronic combined systolic (congestive) and diastolic (congestive) heart failure: Secondary | ICD-10-CM | POA: Diagnosis present

## 2017-09-15 DIAGNOSIS — R140 Abdominal distension (gaseous): Secondary | ICD-10-CM

## 2017-09-15 DIAGNOSIS — Z8709 Personal history of other diseases of the respiratory system: Secondary | ICD-10-CM | POA: Diagnosis not present

## 2017-09-15 DIAGNOSIS — L8990 Pressure ulcer of unspecified site, unspecified stage: Secondary | ICD-10-CM | POA: Diagnosis present

## 2017-09-15 DIAGNOSIS — E669 Obesity, unspecified: Secondary | ICD-10-CM | POA: Diagnosis present

## 2017-09-15 DIAGNOSIS — Z89431 Acquired absence of right foot: Secondary | ICD-10-CM

## 2017-09-15 HISTORY — PX: TEMPORARY PACEMAKER: CATH118268

## 2017-09-15 HISTORY — PX: ULTRASOUND GUIDANCE FOR VASCULAR ACCESS: SHX6516

## 2017-09-15 LAB — URINALYSIS, ROUTINE W REFLEX MICROSCOPIC
Bilirubin Urine: NEGATIVE
Glucose, UA: NEGATIVE mg/dL
Ketones, ur: NEGATIVE mg/dL
Leukocytes, UA: NEGATIVE
Nitrite: NEGATIVE
PROTEIN: 30 mg/dL — AB
Specific Gravity, Urine: 1.009 (ref 1.005–1.030)
pH: 7 (ref 5.0–8.0)

## 2017-09-15 LAB — CBC
HEMATOCRIT: 32.7 % — AB (ref 39.0–52.0)
HEMOGLOBIN: 10.4 g/dL — AB (ref 13.0–17.0)
MCH: 26.9 pg (ref 26.0–34.0)
MCHC: 31.8 g/dL (ref 30.0–36.0)
MCV: 84.5 fL (ref 78.0–100.0)
Platelets: 281 10*3/uL (ref 150–400)
RBC: 3.87 MIL/uL — ABNORMAL LOW (ref 4.22–5.81)
RDW: 14.3 % (ref 11.5–15.5)
WBC: 8 10*3/uL (ref 4.0–10.5)

## 2017-09-15 LAB — I-STAT TROPONIN, ED: TROPONIN I, POC: 0.03 ng/mL (ref 0.00–0.08)

## 2017-09-15 LAB — BASIC METABOLIC PANEL
ANION GAP: 15 (ref 5–15)
BUN: 55 mg/dL — ABNORMAL HIGH (ref 6–20)
CALCIUM: 9.2 mg/dL (ref 8.9–10.3)
CO2: 28 mmol/L (ref 22–32)
Chloride: 92 mmol/L — ABNORMAL LOW (ref 101–111)
Creatinine, Ser: 1.69 mg/dL — ABNORMAL HIGH (ref 0.61–1.24)
GFR calc Af Amer: 48 mL/min — ABNORMAL LOW (ref >60)
GFR calc non Af Amer: 41 mL/min — ABNORMAL LOW (ref >60)
GLUCOSE: 145 mg/dL — AB (ref 65–99)
Potassium: 3.4 mmol/L — ABNORMAL LOW (ref 3.5–5.1)
Sodium: 135 mmol/L (ref 135–145)

## 2017-09-15 LAB — I-STAT CHEM 8, ED
BUN: 57 mg/dL — ABNORMAL HIGH (ref 6–20)
Calcium, Ion: 1.09 mmol/L — ABNORMAL LOW (ref 1.15–1.40)
Chloride: 91 mmol/L — ABNORMAL LOW (ref 101–111)
Creatinine, Ser: 1.7 mg/dL — ABNORMAL HIGH (ref 0.61–1.24)
Glucose, Bld: 140 mg/dL — ABNORMAL HIGH (ref 65–99)
HEMATOCRIT: 32 % — AB (ref 39.0–52.0)
HEMOGLOBIN: 10.9 g/dL — AB (ref 13.0–17.0)
Potassium: 3.6 mmol/L (ref 3.5–5.1)
SODIUM: 136 mmol/L (ref 135–145)
TCO2: 33 mmol/L — AB (ref 22–32)

## 2017-09-15 LAB — INFLUENZA PANEL BY PCR (TYPE A & B)
INFLBPCR: NEGATIVE
Influenza A By PCR: POSITIVE — AB

## 2017-09-15 LAB — CBG MONITORING, ED: Glucose-Capillary: 142 mg/dL — ABNORMAL HIGH (ref 65–99)

## 2017-09-15 LAB — TROPONIN I
Troponin I: 0.12 ng/mL (ref <0.03)
Troponin I: 0.12 ng/mL (ref <0.03)

## 2017-09-15 LAB — MAGNESIUM: MAGNESIUM: 1.7 mg/dL (ref 1.7–2.4)

## 2017-09-15 LAB — GLUCOSE, CAPILLARY: GLUCOSE-CAPILLARY: 93 mg/dL (ref 65–99)

## 2017-09-15 LAB — MRSA PCR SCREENING: MRSA by PCR: NEGATIVE

## 2017-09-15 SURGERY — TEMPORARY PACEMAKER
Anesthesia: LOCAL

## 2017-09-15 MED ORDER — SODIUM CHLORIDE 0.9% FLUSH
3.0000 mL | Freq: Two times a day (BID) | INTRAVENOUS | Status: DC
  Administered 2017-09-15 – 2017-09-27 (×19): 3 mL via INTRAVENOUS
  Administered 2017-09-28: 10 mL via INTRAVENOUS
  Administered 2017-09-28 – 2017-10-10 (×14): 3 mL via INTRAVENOUS

## 2017-09-15 MED ORDER — ENOXAPARIN SODIUM 40 MG/0.4ML ~~LOC~~ SOLN
40.0000 mg | SUBCUTANEOUS | Status: DC
  Administered 2017-09-15 – 2017-09-16 (×2): 40 mg via SUBCUTANEOUS
  Filled 2017-09-15 (×2): qty 0.4

## 2017-09-15 MED ORDER — ONDANSETRON HCL 4 MG/2ML IJ SOLN
4.0000 mg | Freq: Four times a day (QID) | INTRAMUSCULAR | Status: DC | PRN
  Administered 2017-09-21 – 2017-10-22 (×8): 4 mg via INTRAVENOUS
  Filled 2017-09-15 (×8): qty 2

## 2017-09-15 MED ORDER — ACETAMINOPHEN 325 MG PO TABS
650.0000 mg | ORAL_TABLET | ORAL | Status: DC | PRN
Start: 2017-09-15 — End: 2017-10-26
  Administered 2017-09-16 – 2017-10-22 (×10): 650 mg via ORAL
  Filled 2017-09-15 (×11): qty 2

## 2017-09-15 MED ORDER — POTASSIUM CHLORIDE 10 MEQ/100ML IV SOLN
10.0000 meq | INTRAVENOUS | Status: AC
  Administered 2017-09-15 – 2017-09-16 (×3): 10 meq via INTRAVENOUS
  Filled 2017-09-15 (×3): qty 100

## 2017-09-15 MED ORDER — IPRATROPIUM-ALBUTEROL 0.5-2.5 (3) MG/3ML IN SOLN
3.0000 mL | Freq: Two times a day (BID) | RESPIRATORY_TRACT | Status: DC
  Administered 2017-09-15 – 2017-09-25 (×20): 3 mL via RESPIRATORY_TRACT
  Filled 2017-09-15 (×21): qty 3

## 2017-09-15 MED ORDER — INSULIN ASPART 100 UNIT/ML ~~LOC~~ SOLN
0.0000 [IU] | Freq: Three times a day (TID) | SUBCUTANEOUS | Status: DC
  Administered 2017-09-16: 2 [IU] via SUBCUTANEOUS
  Administered 2017-09-17 – 2017-09-18 (×6): 1 [IU] via SUBCUTANEOUS
  Administered 2017-09-20 (×3): 2 [IU] via SUBCUTANEOUS
  Administered 2017-09-21 – 2017-09-23 (×6): 1 [IU] via SUBCUTANEOUS
  Administered 2017-09-23: 2 [IU] via SUBCUTANEOUS
  Administered 2017-09-24 (×3): 1 [IU] via SUBCUTANEOUS
  Administered 2017-09-25 (×3): 2 [IU] via SUBCUTANEOUS

## 2017-09-15 MED ORDER — SODIUM CHLORIDE 0.9 % IV SOLN
INTRAVENOUS | Status: AC
  Administered 2017-09-15: 100 mL/h via INTRAVENOUS

## 2017-09-15 MED ORDER — HYDROCODONE-ACETAMINOPHEN 5-325 MG PO TABS
1.0000 | ORAL_TABLET | Freq: Every day | ORAL | Status: DC | PRN
  Administered 2017-09-16: 1 via ORAL
  Filled 2017-09-15: qty 1

## 2017-09-15 MED ORDER — FLUDROCORTISONE ACETATE 0.1 MG PO TABS
0.1000 mg | ORAL_TABLET | Freq: Every day | ORAL | Status: DC
  Administered 2017-09-16 – 2017-09-19 (×4): 0.1 mg via ORAL
  Filled 2017-09-15 (×5): qty 1

## 2017-09-15 MED ORDER — LIDOCAINE HCL (PF) 1 % IJ SOLN
INTRAMUSCULAR | Status: DC | PRN
  Administered 2017-09-15: 10 mL

## 2017-09-15 MED ORDER — SODIUM CHLORIDE 0.9% FLUSH
3.0000 mL | INTRAVENOUS | Status: DC | PRN
  Administered 2017-09-28: 10 mL via INTRAVENOUS
  Filled 2017-09-15: qty 3

## 2017-09-15 MED ORDER — POLYVINYL ALCOHOL 1.4 % OP SOLN: 1.0000 [drp] | Freq: Two times a day (BID) | OPHTHALMIC | Status: DC | PRN

## 2017-09-15 MED ORDER — HEPARIN (PORCINE) IN NACL 2-0.9 UNIT/ML-% IJ SOLN
INTRAMUSCULAR | Status: AC
  Filled 2017-09-15: qty 500

## 2017-09-15 MED ORDER — PAROXETINE HCL 20 MG PO TABS
40.0000 mg | ORAL_TABLET | Freq: Every day | ORAL | Status: DC
  Administered 2017-09-16 – 2017-09-19 (×4): 40 mg via ORAL
  Filled 2017-09-15 (×6): qty 2

## 2017-09-15 MED ORDER — ASPIRIN EC 81 MG PO TBEC
81.0000 mg | DELAYED_RELEASE_TABLET | Freq: Every day | ORAL | Status: DC
  Administered 2017-09-16: 81 mg via ORAL
  Filled 2017-09-15: qty 1

## 2017-09-15 MED ORDER — LIDOCAINE HCL 1 % IJ SOLN
INTRAMUSCULAR | Status: AC
  Filled 2017-09-15: qty 20

## 2017-09-15 MED ORDER — PANTOPRAZOLE SODIUM 40 MG PO TBEC
40.0000 mg | DELAYED_RELEASE_TABLET | Freq: Every day | ORAL | Status: DC
  Administered 2017-09-16 – 2017-09-20 (×5): 40 mg via ORAL
  Filled 2017-09-15 (×5): qty 1

## 2017-09-15 MED ORDER — OSELTAMIVIR PHOSPHATE 30 MG PO CAPS
30.0000 mg | ORAL_CAPSULE | Freq: Two times a day (BID) | ORAL | Status: DC
  Administered 2017-09-15 – 2017-09-18 (×6): 30 mg via ORAL
  Filled 2017-09-15 (×7): qty 1

## 2017-09-15 MED ORDER — CLONAZEPAM 0.5 MG PO TABS
0.5000 mg | ORAL_TABLET | Freq: Every day | ORAL | Status: DC
  Administered 2017-09-15 – 2017-09-17 (×3): 0.5 mg via ORAL
  Filled 2017-09-15 (×3): qty 1

## 2017-09-15 MED ORDER — NITROGLYCERIN 0.4 MG SL SUBL
0.4000 mg | SUBLINGUAL_TABLET | SUBLINGUAL | Status: DC | PRN
  Administered 2017-10-22 (×3): 0.4 mg via SUBLINGUAL
  Filled 2017-09-15: qty 1

## 2017-09-15 MED ORDER — ATORVASTATIN CALCIUM 80 MG PO TABS
80.0000 mg | ORAL_TABLET | Freq: Every day | ORAL | Status: DC
  Administered 2017-09-16 – 2017-09-19 (×4): 80 mg via ORAL
  Filled 2017-09-15 (×5): qty 1

## 2017-09-15 MED ORDER — HEPARIN (PORCINE) IN NACL 2-0.9 UNIT/ML-% IJ SOLN
INTRAMUSCULAR | Status: AC | PRN
  Administered 2017-09-15: 500 mL

## 2017-09-15 MED ORDER — GABAPENTIN 300 MG PO CAPS
600.0000 mg | ORAL_CAPSULE | Freq: Two times a day (BID) | ORAL | Status: DC
  Administered 2017-09-16 – 2017-09-18 (×4): 600 mg via ORAL
  Filled 2017-09-15 (×4): qty 2

## 2017-09-15 MED ORDER — INSULIN GLARGINE 100 UNIT/ML ~~LOC~~ SOLN
10.0000 [IU] | Freq: Every day | SUBCUTANEOUS | Status: DC
  Administered 2017-09-18 – 2017-09-26 (×9): 10 [IU] via SUBCUTANEOUS
  Filled 2017-09-15 (×13): qty 0.1

## 2017-09-15 MED ORDER — SODIUM CHLORIDE 0.9 % IV SOLN: 250.0000 mL | INTRAVENOUS | Status: DC | PRN

## 2017-09-15 SURGICAL SUPPLY — 7 items
CABLE ADAPT CONN TEMP 6FT (ADAPTER) ×1 IMPLANT
CATH S G BIP PACING (SET/KITS/TRAYS/PACK) ×1 IMPLANT
COVER PRB 48X5XTLSCP FOLD TPE (BAG) IMPLANT
COVER PROBE 5X48 (BAG) ×3
PACK CARDIAC CATHETERIZATION (CUSTOM PROCEDURE TRAY) ×1 IMPLANT
SHEATH PINNACLE 6F 10CM (SHEATH) ×1 IMPLANT
SLEEVE REPOSITIONING LENGTH 30 (MISCELLANEOUS) ×1 IMPLANT

## 2017-09-15 NOTE — ED Notes (Signed)
Pt picked up for transport by cath lab team [facility] provided

## 2017-09-15 NOTE — ED Provider Notes (Signed)
[facility] EMERGENCY DEPARTMENT Provider Note   CSN: [id number] Arrival date & time: 09/15/17  3335     History   Chief Complaint No chief complaint on file.   HPI [name] is a 62 y.o. male.  Patient is a 62 year old male with a history of CHF, hypertension, hyperlipidemia, diabetes, COPD and a chronic abdominal wall hernia that is nonoperable who presents with shortness of breath.  He states he has had a cough which is mostly dry that started last night and got worse through today.  He has had some shortness of breath associated with that and has some dizziness and generalized weakness.  He states he had some chest pain earlier today around 5 AM which he describes as a tightness in his left chest.  He currently denies any chest pain.  He denies any fevers.  No vomiting.  No other recent.  He is followed by Dr. Acie Fredrickson with cardiology.      Past Medical History:  Diagnosis Date  . Abdominal hernia    Chronic, not a good surgical candidate  . Abscess, abdomen 12/31/2010   Referred to Wound Care in 01/2011 because of multiple abd abscess with VERY large ventral hernia (please look at image of CT abd/pelvis 09/2010).  Because of hernia I was hesitant to I&D.      Marland Kitchen Anemia    History of Iron Def Anemia  . Anxiety   . AVM (arteriovenous malformation)    chronic GI blood loss  . Carotid artery disease ([place])    s/p CEA  . Chronic diastolic CHF (congestive heart failure) (HCC)    takes Lasix  . Chronic low back pain   . COPD (chronic obstructive pulmonary disease) ([place])   . CVA (cerebral infarction) 09/2010   Bilateral with Left > Right  . Diabetes mellitus   . GERD (gastroesophageal reflux disease)   . History of nuclear stress test    Myoview 9/16:  Inferior, apical and inf-lateral ischemia; not gated; High Risk  . Hx of echocardiogram    Echo 5/16:  Mild LVH, EF 55%, indeterm. diast function, WMA could not be ruled out, MAC, trivial MR, mild LAE, normal  RVF //  b. Echo 4/17: EF 55-60%, no RWMA, Gr 2 DD, Ao sclerosis, MAC, mild MS, mild LAE, PASP 55 mHg  . Hyperlipidemia   . Hypertension   . Intellectual disability    Sister helps to take care of him and takes him to appts  . Itching    all over body; pt scratches and has sores on bilateral arms and abdomen  . Lung nodule   . Myocardial infarction [facility]) 2016 ?   Heart attack  (  Per  pt. )  . Obesity   . Oxygen dependent    wears 2 liters at bedtime and when needed  . PAF (paroxysmal atrial fibrillation) ([place]) 06/2009   CHADS score 2 (HTN, DM) // Pradaxa for Afib // Pradaxa stopped due to worsening anemia (Hgb in 5/18 8.5 >> Pradaxa remains on hold)   . Pneumonia    hx of  . Stroke ([place])   . Tobacco user    Smokes 1ppd for multiple years.  Quit after hosp 09/2010.  . Tubular adenoma of colon   . Wears glasses     Patient Active Problem List   Diagnosis Date Noted  . Tachy-brady syndrome ([place])   . Sinus pause   . Intertrigo   . Right bundle branch block (  RBBB) on electrocardiography   . Dysphagia   . Sepsis ([place]) 02/13/2016  . Ischemic cardiomyopathy 09/17/2015  . CAD (coronary artery disease) 07/14/2015  . Chronic combined systolic and diastolic CHF (congestive heart failure) ([place]) 07/14/2015  . Abrasion of abdominal wall 10/26/2014  . Gastritis and gastroduodenitis 06/10/2014  . Benign neoplasm of ascending colon 06/10/2014  . 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 Wound, open, foot 12/13/2012  . IDA (iron deficiency anemia) 12/13/2012  . Hyperkalemia 05/22/2012  . Diabetic leg ulcer ([place]) 05/15/2012  . Routine health maintenance 05/15/2012  . Leg weakness, bilateral 12/10/2011  . Carotid artery disease ([place]) 08/31/2011  . Sciatica 05/22/2011  . Ventral hernia without obstruction or gangrene 01/24/2011  . Incidental lung nodule, greater than or equal to 42m 11/24/2010  . CVA (cerebral vascular accident) ([place] 10/24/2010  .  Paroxysmal atrial fibrillation with rapid ventricular response ([place] 07/24/2009  . Anxiety state 06/22/2007  . DM (diabetes mellitus), type 2, uncontrolled ([place] 12/22/2006  . Essential hypertension 12/22/2006  . HYPERCHOLESTEROLEMIA 10/05/2006  . TOBACCO DEPENDENCE 10/05/2006  . Unspecified intellectual disabilities 10/05/2006  . COPD (chronic obstructive pulmonary disease) ([place] 10/05/2006    Past Surgical History:  Procedure Laterality Date  . AMPUTATION Right 03/29/2013   Procedure: AMPUTATION RAY;  Surgeon: MNewt Minion MD;  [facility]  Service: Orthopedics;  Laterality: Right;  Right Foot 3rd and Possible 4th Ray Amputation  . AMPUTATION Right 04/23/2013   Procedure: AMPUTATION RIGHT MID-FOOT;  Surgeon: MNewt Minion MD;  [facility]  Service: Orthopedics;  Laterality: Right;  . arm surgery Left    as a child  . CARDIAC CATHETERIZATION N/A 04/30/2015   Procedure: Left Heart Cath and Coronary Angiography;  Surgeon: HBelva Crome MD;  [facility];  Service: Cardiovascular;  Laterality: N/A;  . Carotid arteriogram  10/2010   30% right ICA stenosis, 40% left ICA stenosis   . CAROTID ENDARTERECTOMY Left 08-13-15   CEA  . CATARACT EXTRACTION, BILATERAL    . COLONOSCOPY WITH PROPOFOL N/A 06/10/2014   Procedure: COLONOSCOPY WITH PROPOFOL;  Surgeon: JJerene Bears MD;  Location: [facility];  Service: Gastroenterology;  Laterality: N/A;  . CORONARY ARTERY BYPASS GRAFT N/A 08/13/2015   Procedure: CORONARY ARTERY BYPASS GRAFTING (CABG), ON PUMP, TIMES THREE, USING LEFT INTERNAL MAMMARY ARTERY, RIGHT GREATER SAPHENOUS VEIN HARVESTED ENDOSCOPICALLY;  Surgeon: BGaye Pollack MD;  [facility]  Service: Open Heart Surgery;  Laterality: N/A;  . DEBRIDMENT OF DECUBITUS ULCER Right 02/13/2013  . ENDARTERECTOMY Left 08/13/2015   Procedure: ENDARTERECTOMY CAROTID;  Surgeon: CAngelia Mould MD;  [facility]  Service: Vascular;  Laterality: Left;  .  ESOPHAGOGASTRODUODENOSCOPY (EGD) WITH PROPOFOL N/A 06/10/2014   Procedure: ESOPHAGOGASTRODUODENOSCOPY (EGD) WITH PROPOFOL;  Surgeon: JJerene Bears MD;  [facility];  Service: Gastroenterology;  Laterality: N/A;  . GIVENS CAPSULE STUDY N/A 07/09/2014   Procedure: GIVENS CAPSULE STUDY;  Surgeon: JJerene Bears MD;  [facility]: [facility];  Service: Gastroenterology;  Laterality: N/A;  . I&D EXTREMITY Right 02/13/2013   Procedure: IRRIGATION AND DEBRIDEMENT FOOT ULCER;  Surgeon: JJohnny Bridge MD;  [facility]  Service: Orthopedics;  Laterality: Right;  PULSE LAVAGE  . MULTIPLE TOOTH EXTRACTIONS    . TEE WITHOUT CARDIOVERSION N/A 08/13/2015   Procedure: TRANSESOPHAGEAL ECHOCARDIOGRAM (TEE);  Surgeon: BGaye Pollack MD;  [facility]  Service: Open Heart Surgery;  Laterality: N/A;  . TRANSESOPHAGEAL  ECHOCARDIOGRAM  09/2010   No ASD or PFO. EF 60-65%.  Normal systolic function. No evidence of thrombus.   . TRANSTHORACIC ECHOCARDIOGRAM  09/2010    The cavity size was normal. Systolic function was vigorous.  EF 65-70%.  Normal wall funciton.        Home Medications    Prior to Admission medications   Medication Sig Start Date End Date Taking? Authorizing Provider  acetaminophen (TYLENOL) 325 MG tablet Take 650 mg by mouth 3 (three) times daily as needed for mild pain.    [provider]  amLODipine (NORVASC) 5 MG tablet Take 5 mg by mouth daily.    [provider]  atorvastatin (LIPITOR) 80 MG tablet Take 80 mg by mouth daily.    [provider]  calcium carbonate (TUMS - DOSED IN MG ELEMENTAL CALCIUM) 500 MG chewable tablet Chew 1 tablet by mouth 3 (three) times daily as needed for indigestion or heartburn.    [provider]  Cholecalciferol (VITAMIN D3) 1000 UNITS CAPS Take 1,000 Units by mouth daily.    [provider]  clonazePAM (KLONOPIN) 0.5 MG tablet Take 1 tablet (0.5 mg total) by mouth at bedtime. 11/03/14   Janece Canterbury, MD   fludrocortisone (FLORINEF) 0.1 MG tablet Take 0.1 mg by mouth daily.    [provider]  furosemide (LASIX) 80 MG tablet Take 1.5 tablets (120 mg total) by mouth 2 (two) times daily. 10/12/16 01/10/17  Richardson Dopp T, PA-C  gabapentin (NEURONTIN) 600 MG tablet Take 600 mg by mouth 2 (two) times daily.    [provider]  HYDROcodone-acetaminophen (NORCO) 5-325 MG tablet Take 1 tablet by mouth daily.    [provider]  hydrocortisone (ANUSOL-HC) 2.5 % rectal cream Place 1 application rectally 3 (three) times daily as needed for hemorrhoids or itching.    [provider]  Insulin Glargine (LANTUS) 100 UNIT/ML Solostar Pen Inject 10 Units into the skin every evening.    [provider]  ipratropium-albuterol (DUONEB) 0.5-2.5 (3) MG/3ML SOLN Take 3 mLs by nebulization 2 (two) times daily.    [provider]  metFORMIN (GLUCOPHAGE) 1000 MG tablet Take 1,000 mg by mouth 2 (two) times daily with a meal.    [provider]  metolazone (ZAROXOLYN) 5 MG tablet Take 1 tablet (5 mg total) by mouth once a week. 10/31/16 01/29/17  Nahser, Wonda Cheng, MD  metoprolol tartrate (LOPRESSOR) 25 MG tablet Take 25 mg by mouth 2 (two) times daily.    [provider]  nitroGLYCERIN (NITROSTAT) 0.4 MG SL tablet Place 0.4 mg under the tongue every 5 (five) minutes as needed for chest pain.    [provider]  nystatin (MYCOSTATIN/NYSTOP) powder Apply topically 3 (three) times daily. 08/14/16   Burgess Estelle, MD  pantoprazole (PROTONIX) 40 MG tablet Take 40 mg by mouth daily.    [provider]  PARoxetine (PAXIL) 40 MG tablet Take 40 mg by mouth daily.     [provider]  polyvinyl alcohol (LIQUIFILM TEARS) 1.4 % ophthalmic solution Place 1 drop into both eyes 2 (two) times daily as needed for dry eyes. Natural Tears    [provider]  potassium chloride SA (K-DUR,KLOR-CON) 20 MEQ tablet Take 1 tablet (20 mEq total) by  mouth daily. 10/12/16   Richardson Dopp T, PA-C  sucralfate (CARAFATE) 1 GM/10ML suspension Take 10 mLs (1 g total) by mouth 4 (four) times daily -  with meals and at bedtime. 08/14/16  Burgess Estelle, MD    Family History Family History  Problem Relation Age of Onset  . Heart disease Mother   . Hypertension Sister   . Heart disease Brother   . Heart disease Father   . Peripheral vascular disease Father   . Heart disease Maternal Grandmother   . Heart attack Maternal Grandmother   . Breast cancer Maternal Grandmother   . Stomach cancer Maternal Uncle   . Colon cancer Neg Hx   . Stroke Neg Hx     Social History Social History   Tobacco Use  . Smoking status: Former Smoker    Packs/day: 1.00    Years: 42.00    Pack years: 42.00    Types: Cigarettes, Pipe    Last attempt to quit: 11/10/2010    Years since quitting: 6.8  . Smokeless tobacco: Former Systems developer    Quit date: 10/02/2010  Substance Use Topics  . Alcohol use: No    Alcohol/week: 0.0 oz    Comment: " last drink was 2003" 08/11/15  . Drug use: No     Allergies   Penicillins   Review of Systems Review of Systems  Constitutional: Positive for fatigue. Negative for chills, diaphoresis and fever.  HENT: Negative for congestion, rhinorrhea and sneezing.   Eyes: Negative.   Respiratory: Positive for cough, chest tightness and shortness of breath.   Cardiovascular: Negative for chest pain and leg swelling.  Gastrointestinal: Negative for abdominal pain, blood in stool, diarrhea, nausea and vomiting.  Genitourinary: Negative for difficulty urinating, flank pain, frequency and hematuria.  Musculoskeletal: Negative for arthralgias and back pain.  Skin: Negative for rash.  Neurological: Positive for light-headedness. Negative for dizziness, speech difficulty, weakness, numbness and headaches.     Physical Exam Updated Vital Signs BP 92/63   Pulse (!) 129   Temp 99.6 F (37.6 C) (Oral)   Resp (!) 21   Ht 5' (1.524 m)    Wt 78.9 kg (174 lb)   SpO2 97%   BMI 33.98 kg/m   Physical Exam  Constitutional: He is oriented to person, place, and time. He appears well-developed and well-nourished.  HENT:  Head: Normocephalic and atraumatic.  Eyes: Pupils are equal, round, and reactive to light.  Neck: Normal range of motion. Neck supple.  Cardiovascular: Regular rhythm and normal heart sounds. Tachycardia present.  Pulmonary/Chest: Effort normal. No respiratory distress. He has no wheezes. He has rales. He exhibits no tenderness.  Abdominal: Soft. Bowel sounds are normal. There is no tenderness. There is no rebound and no guarding.  Patient is a very large abdominal hernia which is nontender to palpation  Musculoskeletal: Normal range of motion. He exhibits no edema.  Lymphadenopathy:    He has no cervical adenopathy.  Neurological: He is alert and oriented to person, place, and time.  Skin: Skin is warm and dry. No rash noted.  Psychiatric: He has a normal mood and affect.     ED Treatments / Results  Labs (all labs ordered are listed, but only abnormal results are displayed) Labs Reviewed  BASIC METABOLIC PANEL - Abnormal; Notable for the following components:      Result Value   Potassium 3.4 (*)    Chloride 92 (*)    Glucose, Bld 145 (*)    BUN 55 (*)    Creatinine, Ser 1.69 (*)    GFR calc non Af Amer 41 (*)    GFR calc Af Amer 48 (*)    All other components within normal  limits  CBC - Abnormal; Notable for the following components:   RBC 3.87 (*)    Hemoglobin 10.4 (*)    HCT 32.7 (*)    All other components within normal limits  URINALYSIS, ROUTINE W REFLEX MICROSCOPIC - Abnormal; Notable for the following components:   Color, Urine STRAW (*)    Hgb urine dipstick SMALL (*)    Protein, ur 30 (*)    Bacteria, UA RARE (*)    Squamous Epithelial / LPF 0-5 (*)    All other components within normal limits  CBG MONITORING, ED - Abnormal; Notable for the following components:    Glucose-Capillary 142 (*)    All other components within normal limits  I-STAT CHEM 8, ED - Abnormal; Notable for the following components:   Chloride 91 (*)    BUN 57 (*)    Creatinine, Ser 1.70 (*)    Glucose, Bld 140 (*)    Calcium, Ion 1.09 (*)    TCO2 33 (*)    Hemoglobin 10.9 (*)    HCT 32.0 (*)    All other components within normal limits  CBG MONITORING, ED  I-STAT TROPONIN, ED  I-STAT TROPONIN, ED    EKG  EKG Interpretation  Date/Time:  Friday September 15 2017 09:00:52 EST Ventricular Rate:  84 PR Interval:    QRS Duration: 147 QT Interval:  426 QTC Calculation: 433 R Axis:   -87 Text Interpretation:  Atrial fibrillation Right bundle branch block Anterolateral infarct, old Confirmed by Malvin Johns [phone]) on 09/15/2017 9:23:53 AM       Radiology Dg Chest Port 1 View  Result Date: 09/15/2017 CLINICAL DATA:  Shortness of breath EXAM: PORTABLE CHEST 1 VIEW COMPARISON:  08/31/2016 FINDINGS: Mild cardiomegaly. Status post CABG. There are increased markings at the bases that is likely primarily from atelectasis and interstitial crowding. Lung volumes are low. No Kerley lines or effusion. No air bronchogram. IMPRESSION: 1. Increased markings at the bases favoring atelectasis in the setting of low lung volumes. No air bronchogram or pulmonary edema. 2. Chronic cardiomegaly. Electronically Signed   By: Monte Fantasia M.D.   On: 09/15/2017 09:43    Procedures Procedures (including critical care time)  Medications Ordered in ED Medications - No data to display   Initial Impression / Assessment and Plan / ED Course  I have reviewed the triage vital signs and the nursing notes.  Pertinent labs & imaging results that were available during my care of the patient were reviewed by me and considered in my medical decision making (see chart for details).     9:15 patient's EKG shows atrial fibrillation with RVR although he is having frequent long 5-6-second pauses where he  becomes unresponsive.  This only lasted a few seconds and then he is awake and alert again.  Cardiology has been consulted to come see the patient.  9:50 pts bp improving after fluids  Cardiology to monitor pt and possibly place pacemaker, request medicine admission.  I spoke with the IM teaching service who will admit the pt.  CRITICAL CARE Performed by: Malvin Johns Total critical care time: 45 minutes Critical care time was exclusive of separately billable procedures and treating other patients. Critical care was necessary to treat or prevent imminent or life-threatening deterioration. Critical care was time spent personally by me on the following activities: development of treatment plan with patient and/or surrogate as well as nursing, discussions with consultants, evaluation of patient's response to treatment, examination of patient, obtaining history from  patient or surrogate, ordering and performing treatments and interventions, ordering and review of laboratory studies, ordering and review of radiographic studies, pulse oximetry and re-evaluation of patient's condition.   Final Clinical Impressions(s) / ED Diagnoses   Final diagnoses:  Tachy-brady syndrome [facility])    ED Discharge Orders    None       Malvin Johns, MD 09/15/17 1329

## 2017-09-15 NOTE — Interval H&P Note (Signed)
History and Physical Interval Note:  09/15/2017 3:21 PM  [name]  has presented today for surgery, with the diagnosis of 10 second pauses  The various methods of treatment have been discussed with the patient and family. After consideration of risks, benefits and other options for treatment, the patient has consented to  Procedure(s): TEMPORARY PACEMAKER (N/A) as a surgical intervention .  The patient's history has been reviewed, patient examined, no change in status, stable for surgery.  I have reviewed the patient's chart and labs.  Questions were answered to the patient's satisfaction.     Larae Grooms

## 2017-09-15 NOTE — H&P (Signed)
Date: 09/15/2017               Patient Name:  [name] MRN: [id number]  DOB: 1956/03/01 Age / Sex: 62 y.o., male   PCP: Janifer Adie, MD         Medical Service: Internal Medicine Teaching Service         Attending Physician: Dr. Annia Belt, MD    First Contact: Dr. Trilby Drummer Pager: [phone]  Second Contact: Dr. Reesa Chew Pager: [phone]       After Hours (After 5p/  First Contact Pager: [phone]  weekends / holidays): Second Contact Pager: [phone]   Chief Complaint: Generalized Weakness  History of Present Illness: 62 yo M with History of Paroxysmal A. Fib, CHF (Last Echo 11/16/15 EF40-50%,  CAD (s/p CABG 2017) , Carotid stenosis (S/P L Carotid Endarterectomy), CVA, COPD, HTN, Abdominal Hernia, and Diabetes presented via [facility] with Generalized weakness and was found to be in A. Fib with RVR and having intermittent 6-10 second sinus pauses. Patient states he has had 1 day of symptoms consisting of dry cough, general malaise, intermittent dizziness (without falls), palpitations, shortness of breath, and chest pain. He states his chest pain is intermittent and is not associated with activity not relieved by rest. His pain is reproducible on palpation. He denies Myalgias, Nausea, vomiting, diarrhea, constipation, changes to his urination. He endorses sick contact of his roommate, who he believes has had the flu recently though he states the roommate had not seen a doctor for this. He states that he takes all his medications regularly and has them delivered in pre-packaged sets. He state he has been eating and drinking normally for him, but endorses some chronically reduced appetite. While interviewing the patient he experienced a symptomatic 9 second pause and lost consciousness for several seconds before regaining consciousness when his heart resumed beating. He was not confused upon awakening and state he hardly noticed the event.  In the ED, patient was found to be hypotensive  (80s-90s/50s-60s) and tachycardic with irregular rhythm, satting 97% on 2L [place], RR 12-21, and Afebrile. Patient had received 574m bolus PTA. Cardiology was consulted in the ED. CBC showed Hgb 10.4 (seems to be his baseline); BMP showed K 3.4, BUN 55, Cr 1.69; and ISTAT troponin was negative. U/A showed rare bacteria and small hgb, no leuks, no nitrites. EKG showed Atrial fibrillation w. RVR, Right bundle branch block, and 3 second pause. CXR showed increased markings at the lung base favor atelectasis 2/2 poor inspiration. Patient received fluids in the ED per their note, but it is unclear how much. Patient to admitted to for further workup and care with electrophysiology to evaluate the patient for potential transvenous pacing.  Meds:  No outpatient medications have been marked as taking for the 09/15/17 [facility]     Allergies: Allergies as of 09/15/2017 - Review Complete 09/15/2017  Allergen Reaction Noted  . Penicillins Hives, Nausea And Vomiting, Swelling, and Other (See Comments) 06/22/2007   Past Medical History:  Diagnosis Date  . Abdominal hernia    Chronic, not a good surgical candidate  . Abscess, abdomen 12/31/2010   Referred to Wound Care in 01/2011 because of multiple abd abscess with VERY large ventral hernia (please look at image of CT abd/pelvis 09/2010).  Because of hernia I was hesitant to I&D.      .Marland KitchenAnemia    History of Iron Def Anemia  . Anxiety   . AVM (arteriovenous malformation)  chronic GI blood loss  . Carotid artery disease ([place])    s/p CEA  . Chronic diastolic CHF (congestive heart failure) (HCC)    takes Lasix  . Chronic low back pain   . COPD (chronic obstructive pulmonary disease) ([place])   . CVA (cerebral infarction) 09/2010   Bilateral with Left > Right  . Diabetes mellitus   . GERD (gastroesophageal reflux disease)   . History of nuclear stress test    Myoview 9/16:  Inferior, apical and inf-lateral ischemia; not gated; High Risk    . Hx of echocardiogram    Echo 5/16:  Mild LVH, EF 55%, indeterm. diast function, WMA could not be ruled out, MAC, trivial MR, mild LAE, normal RVF //  b. Echo 4/17: EF 55-60%, no RWMA, Gr 2 DD, Ao sclerosis, MAC, mild MS, mild LAE, PASP 55 mHg  . Hyperlipidemia   . Hypertension   . Intellectual disability    Sister helps to take care of him and takes him to appts  . Itching    all over body; pt scratches and has sores on bilateral arms and abdomen  . Lung nodule   . Myocardial infarction [facility]) 2016 ?   Heart attack  (  Per  pt. )  . Obesity   . Oxygen dependent    wears 2 liters at bedtime and when needed  . PAF (paroxysmal atrial fibrillation) ([place]) 06/2009   CHADS score 2 (HTN, DM) // Pradaxa for Afib // Pradaxa stopped due to worsening anemia (Hgb in 5/18 8.5 >> Pradaxa remains on hold)   . Pneumonia    hx of  . Stroke ([place])   . Tobacco user    Smokes 1ppd for multiple years.  Quit after hosp 09/2010.  . Tubular adenoma of colon   . Wears glasses     Family History:  Family History  Problem Relation Age of Onset  . Heart disease Mother   . Hypertension Sister   . Heart disease Brother   . Heart disease Father   . Peripheral vascular disease Father   . Heart disease Maternal Grandmother   . Heart attack Maternal Grandmother   . Breast cancer Maternal Grandmother   . Stomach cancer Maternal Uncle   . Colon cancer Neg Hx   . Stroke Neg Hx   Reviewed on admission  Social History: Social History   Tobacco Use  . Smoking status: Former Smoker    Packs/day: 1.00    Years: 42.00    Pack years: 42.00    Types: Cigarettes, Pipe    Last attempt to quit: 11/10/2010    Years since quitting: 6.8  . Smokeless tobacco: Former Systems developer    Quit date: 10/02/2010  Substance Use Topics  . Alcohol use: No    Alcohol/week: 0.0 oz    Comment: " last drink was 2003" 08/11/15  . Drug use: No  Reviewed on Admission  Review of Systems: A complete ROS was negative except as per  HPI.  Physical Exam: Blood pressure (!) 97/57, pulse (!) 50, temperature 99.6 F (37.6 C), temperature source Oral, resp. rate (!) 28, height 5' (1.524 m), weight 174 lb (78.9 kg), SpO2 100 %. Physical Exam  Constitutional:  Ill appearing, drowsy male in moderate distress  HENT:  Head: Normocephalic and atraumatic.  Dry oral mucosa  Eyes: EOM are normal. Right eye exhibits no discharge. Left eye exhibits no discharge.  Cardiovascular: Intact distal pulses.  Tachycardic Irregularly Irregular Rhythm 9 second pause noted  on monitor with brief LOC  Pulmonary/Chest: Effort normal. No respiratory distress.  Poor inspiratory effort R basilar crackles Decreased L basilar breath sounds  Abdominal: Soft. Bowel sounds are normal. There is no tenderness.  Musculoskeletal: He exhibits no edema.  Tenderness to Palpation of L chest, parasternally R Transmetatarsal Amputation Venous stasis changes  Neurological: He is alert.  Intermittent syncope with cardiac pauses  Skin: Skin is warm. He is diaphoretic.  Clammy skin Venous Stasis Changes Bilat LE Excoriations LLE   EKG: personally reviewed my interpretation is Atrial fibrillation w. RVR, Right bundle branch block, and 3 second pause.  CXR: personally reviewed and I agree with: IMPRESSION: 1. Increased markings at the bases favoring atelectasis in the setting of low lung volumes. No air bronchogram or pulmonary edema. 2. Chronic cardiomegaly.  Assessment & Plan by Problem: 62 yo M with History of Paroxysmal A. Fib, CHF (Last Echo 11/16/15 EF40-50%,  CAD (s/p CABG 2017) , Carotid stenosis (S/P L Carotid Endarterectomy), CVA, COPD, HTN, Abdominal Hernia, and Diabetes presented via [facility] with Generalized weakness and was found to be in A. Fib with RVR and having intermittent 6-10 second sinus pauses.  Tachy-Brady Syndrome with 6-10 second pauses, Atrial Fibrillation with RVR in the setting of Influenza A: Patient noted to be in A. Fib with RVR  on telemetry in ED with intermittent sinus pauses. Due to significant sinuses pauses antinodal agents were discontinued despite A.fib with RVR. The pauses were 6-7 seconds on cardiology's initial evaluation and 9-10 seconds when seen by Internal Medicine with symptomatic pause (LOC) noted during examination. With his declining cardiac instability electrophysiology was consulted and patient is likely to have transvenous pacing wires implanted and be transferred to the CCU following this procedure. Etiology of onset of his paroxysmal A. Fib and Tachy Brady syndrome likely precipitated by his flu infection as below in the setting of Heart Failure, CAD, and PAF. Low suspicion for myocarditis with negative iSTAT troponin. - Previously on Pradaxa, DC'd 2018 2/2 significant GI bleed (CHADS2-VASC=6, HAS-BLED=4) - Appreciate Cardiology and Electrophysiology Recommendations - Hold metoprolol in the setting of significant sinus pauses - Will allow for A. Fib with RVR until Temporary Pacemaker can be placed - Likely transfer to CCU following EP procedure - Trend troponin - Cardiac Monitoring - AM BMP and CBC  AKI, Hypotension, History of HTN: Patient Currently Hypotensive. Appears to be 2/2 a combination of the patients tachycardia (with decreased filling time) and hypovolemia considering patient has apparent AKI with BUN 55 and Cr 1.69, appears somewhat dry on exam, and has responded to IVF. Likely decreased oral intake and insensible losses in a patient on Lasix and Metolazone. Do not suspect Cardiogenic shock as patient has warm limbs and good pulse pressure. - Hold home Amlodipine, Lasix, Metolazone, and metoprolol - Continue Fludrocortisone 0.55m Daily - NS 500cc Bolus  Hypokalemia: - Replete potassium 320m, Will hold home daily KCl while holding diuretics - Check Mg - AM BMP  Influenza A: Patient is flu A positive. He reported 1 day of coughing and shortness of breath and sick contact of his roommate  who recently had the flu - Droplet precautions - Tamiflu 3026mID x 5d (Starting 09/15/17) - Supportive care PRN  Chest Wall Pain: Patient reported chest pain located L parasternally since this morning. He is unable to characterize the pain, but states that it has improved and the same pain is reproduced upon palpation. ISTAT troponin (-). - Trend Troponin  CAD: S/P CABG x3 08/2015.  -  Continue Atorvastatin, ASA - Hold Metoprolol as above - Nitroglycerin PRN  HFrEF: 02/15/16 EF 45%-50%. Diffuse hypokinesis.  - Hold Metoprolol as above - Holding Lasix and Metolazlone 2/2 Hypotension  DM: On metformin 1032m BID and Lantus 10U qhs. - Hold Metformin - Lantus 10U qhs - SSI-S  COPD: - Continue Duoneb BID PRN  Anxiety, Sciatica, ?Chronic Pain. - Continue Home Clonazepam 0.5 qhs, Gabapentin 6032mBID, Norco 5-325, Paroxetine 4049maily - Contact PACE  FEN: NPO, 500m10mlus DVT Prophylaxis: Lovenox Code Status: Full  Dispo: Admit patient to Inpatient with expected length of stay greater than 2 midnights.  Signed: MelvNeva Seat 09/15/2017, 4:43 PM  Pager: [phone]

## 2017-09-15 NOTE — H&P (View-Only) (Signed)
Called to the bedside to evaluate the patient for 10-second period of asystole.  I discussed the case with the medicine resident who was examining the patient at the time and noted that the patient was on his side and became unresponsive during the event.  He then quickly woke up after the pause.  These episodes have become more frequent and may speak to underlying sepsis.  The patient is noted to be hypotensive.  As he is having alternating A. fib with rapid ventricular response, he may not tolerate the addition of an inotrope.  Discussed the case with cardiac electrophysiology and they recommending a temporary pacemaker wire.  We will contact the Cath Lab to proceed.  Pixie Casino, MD, [facility], [place] Director of the Advanced Lipid Disorders &  Cardiovascular Risk Reduction Clinic Diplomate of the American Board of Clinical Lipidology Attending Cardiologist  Direct Dial: [phone]  Fax: [phone]  Website:  [email or web address]

## 2017-09-15 NOTE — ED Triage Notes (Signed)
Patient from home brought in by [facility] for complaint of generalized weakness since last night. Patient states he lives with a roommate who was has the flu. No other complaints at this time. Room air sat 80%, placed on 2L O2 [place]. Patient lethargic, requires repeat stimulation to stay awake. Hypotensive at 86/54, received 539mL NS PTA. 20g saline lock in left AC.

## 2017-09-15 NOTE — Progress Notes (Signed)
Called to the bedside to evaluate the patient for 10-second period of asystole.  I discussed the case with the medicine resident who was examining the patient at the time and noted that the patient was on his side and became unresponsive during the event.  He then quickly woke up after the pause.  These episodes have become more frequent and may speak to underlying sepsis.  The patient is noted to be hypotensive.  As he is having alternating A. fib with rapid ventricular response, he may not tolerate the addition of an inotrope.  Discussed the case with cardiac electrophysiology and they recommending a temporary pacemaker wire.  We will contact the Cath Lab to proceed.  Pixie Casino, MD, [facility], [place] Director of the Advanced Lipid Disorders &  Cardiovascular Risk Reduction Clinic Diplomate of the American Board of Clinical Lipidology Attending Cardiologist  Direct Dial: [phone]  Fax: [phone]  Website:  [email or web address]

## 2017-09-15 NOTE — ED Notes (Signed)
Pt CBG was 142, notified Clark(RN)

## 2017-09-15 NOTE — Consult Note (Signed)
Cardiology Consult:   Patient ID: [name]; [id number]; 29-Aug-1955   Admit date: 09/15/2017 Date of Consult: 09/15/2017  Primary Care Provider: Janifer Adie, MD Primary Cardiologist: Dr. Kristopher Oppenheim   Patient Profile:   [name] is a 62 y.o. male with a hx of PAF on rate control therapy with BB, chronic systolic HF (EF 12-87%) HTN, diabetes, HL, prior stroke, carotid stenosis, COPD and is mentally handicap, who presented to ED vis EMS for evaluation of dizziness + weakness and found to be in atrial fibrillation w/ RVR + long sinus pauses. Cardiology consulted by Dr. Tamera Punt, ED Physician.   History of Present Illness:   [name] is followed by Dr. Acie Fredrickson. He is mentally handicap, thus history from pt is limited, poor historian. No family present by beside to contribute. Per chart review, he has a h/o PAF, chronic systolic HF, HTN, diabetes, HL, prior stroke, carotid stenosis, COPD. Per ED notes, pt presented via EMS given symptoms of dizziness and weakness. There is also mention in ED physician note that there were complaints of chest tightness earlier, however I cannot elicit this from the patient at this time. He appears lethargic but responds to commands. Tele shows atrial fibrillation with periods of frequent prolonged sinus pauses, 5-6 sec. RBBB. Pace pads are in place. Troponin negative. UA pending. BMP with renal insufficiency. SCr 1.69. BUN 55, c/w values 10 months ago. He is hypokalemic at 3.4. No Mg or TSH ordered. CBC with chronic anemia. Hgb 10.4. Afebrile. Per EMS report, he was living with a roommate, who has flu. Appears disheveled. May have social issues.      Past Medical History:  Diagnosis Date  . Abdominal hernia    Chronic, not a good surgical candidate  . Abscess, abdomen 12/31/2010   Referred to Wound Care in 01/2011 because of multiple abd abscess with VERY large ventral hernia (please look at image of CT abd/pelvis 09/2010).  Because of hernia I was  hesitant to I&D.      Marland Kitchen Anemia    History of Iron Def Anemia  . Anxiety   . AVM (arteriovenous malformation)    chronic GI blood loss  . Carotid artery disease ([place])    s/p CEA  . Chronic diastolic CHF (congestive heart failure) (HCC)    takes Lasix  . Chronic low back pain   . COPD (chronic obstructive pulmonary disease) ([place])   . CVA (cerebral infarction) 09/2010   Bilateral with Left > Right  . Diabetes mellitus   . GERD (gastroesophageal reflux disease)   . History of nuclear stress test    Myoview 9/16:  Inferior, apical and inf-lateral ischemia; not gated; High Risk  . Hx of echocardiogram    Echo 5/16:  Mild LVH, EF 55%, indeterm. diast function, WMA could not be ruled out, MAC, trivial MR, mild LAE, normal RVF //  b. Echo 4/17: EF 55-60%, no RWMA, Gr 2 DD, Ao sclerosis, MAC, mild MS, mild LAE, PASP 55 mHg  . Hyperlipidemia   . Hypertension   . Intellectual disability    Sister helps to take care of him and takes him to appts  . Itching    all over body; pt scratches and has sores on bilateral arms and abdomen  . Lung nodule   . Myocardial infarction [facility]) 2016 ?   Heart attack  (  Per  pt. )  . Obesity   . Oxygen dependent    wears 2 liters at bedtime and  when needed  . PAF (paroxysmal atrial fibrillation) ([place]) 06/2009   CHADS score 2 (HTN, DM) // Pradaxa for Afib // Pradaxa stopped due to worsening anemia (Hgb in 5/18 8.5 >> Pradaxa remains on hold)   . Pneumonia    hx of  . Stroke ([place])   . Tobacco user    Smokes 1ppd for multiple years.  Quit after hosp 09/2010.  . Tubular adenoma of colon   . Wears glasses     Past Surgical History:  Procedure Laterality Date  . AMPUTATION Right 03/29/2013   Procedure: AMPUTATION RAY;  Surgeon: Newt Minion, MD;  Location: [place];  Service: Orthopedics;  Laterality: Right;  Right Foot 3rd and Possible 4th Ray Amputation  . AMPUTATION Right 04/23/2013   Procedure: AMPUTATION RIGHT MID-FOOT;  Surgeon: Newt Minion, MD;   Location: [place];  Service: Orthopedics;  Laterality: Right;  . arm surgery Left    as a child  . CARDIAC CATHETERIZATION N/A 04/30/2015   Procedure: Left Heart Cath and Coronary Angiography;  Surgeon: Belva Crome, MD;  Location: [place] CV [facility];  Service: Cardiovascular;  Laterality: N/A;  . Carotid arteriogram  10/2010   30% right ICA stenosis, 40% left ICA stenosis   . CAROTID ENDARTERECTOMY Left 08-13-15   CEA  . CATARACT EXTRACTION, BILATERAL    . COLONOSCOPY WITH PROPOFOL N/A 06/10/2014   Procedure: COLONOSCOPY WITH PROPOFOL;  Surgeon: Jerene Bears, MD;  Location: [facility];  Service: Gastroenterology;  Laterality: N/A;  . CORONARY ARTERY BYPASS GRAFT N/A 08/13/2015   Procedure: CORONARY ARTERY BYPASS GRAFTING (CABG), ON PUMP, TIMES THREE, USING LEFT INTERNAL MAMMARY ARTERY, RIGHT GREATER SAPHENOUS VEIN HARVESTED ENDOSCOPICALLY;  Surgeon: [name], MD;  Location: [place];  Service: Open Heart Surgery;  Laterality: N/A;  . DEBRIDMENT OF DECUBITUS ULCER Right 02/13/2013  . ENDARTERECTOMY Left 08/13/2015   Procedure: ENDARTERECTOMY CAROTID;  Surgeon: Angelia Mould, MD;  Location: [place];  Service: Vascular;  Laterality: Left;  . ESOPHAGOGASTRODUODENOSCOPY (EGD) WITH PROPOFOL N/A 06/10/2014   Procedure: ESOPHAGOGASTRODUODENOSCOPY (EGD) WITH PROPOFOL;  Surgeon: Jerene Bears, MD;  [facility]: [facility];  Service: Gastroenterology;  Laterality: N/A;  . GIVENS CAPSULE STUDY N/A 07/09/2014   Procedure: GIVENS CAPSULE STUDY;  Surgeon: Jerene Bears, MD;  Location: [facility];  Service: Gastroenterology;  Laterality: N/A;  . I&D EXTREMITY Right 02/13/2013   Procedure: IRRIGATION AND DEBRIDEMENT FOOT ULCER;  Surgeon: Johnny Bridge, MD;  Location: [place];  Service: Orthopedics;  Laterality: Right;  PULSE LAVAGE  . MULTIPLE TOOTH EXTRACTIONS    . TEE WITHOUT CARDIOVERSION N/A 08/13/2015   Procedure: TRANSESOPHAGEAL ECHOCARDIOGRAM (TEE);  Surgeon: [name], MD;  Location: [place];  Service:  Open Heart Surgery;  Laterality: N/A;  . TRANSESOPHAGEAL ECHOCARDIOGRAM  09/2010   No ASD or PFO. EF 60-65%.  Normal systolic function. No evidence of thrombus.   . TRANSTHORACIC ECHOCARDIOGRAM  09/2010    The cavity size was normal. Systolic function was vigorous.  EF 65-70%.  Normal wall funciton.      Home Medications:  Prior to Admission medications   Medication Sig Start Date End Date Taking? Authorizing Provider  acetaminophen (TYLENOL) 325 MG tablet Take 650 mg by mouth 3 (three) times daily as needed for mild pain.    [provider]  amLODipine (NORVASC) 5 MG tablet Take 5 mg by mouth daily.    [provider]  atorvastatin (LIPITOR) 80 MG tablet Take 80 mg by mouth  daily.    [provider]  calcium carbonate (TUMS - DOSED IN MG ELEMENTAL CALCIUM) 500 MG chewable tablet Chew 1 tablet by mouth 3 (three) times daily as needed for indigestion or heartburn.    [provider]  Cholecalciferol (VITAMIN D3) 1000 UNITS CAPS Take 1,000 Units by mouth daily.    [provider]  clonazePAM (KLONOPIN) 0.5 MG tablet Take 1 tablet (0.5 mg total) by mouth at bedtime. 11/03/14   Janece Canterbury, MD  fludrocortisone (FLORINEF) 0.1 MG tablet Take 0.1 mg by mouth daily.    [provider]  furosemide (LASIX) 80 MG tablet Take 1.5 tablets (120 mg total) by mouth 2 (two) times daily. 10/12/16 01/10/17  Richardson Dopp T, PA-C  gabapentin (NEURONTIN) 600 MG tablet Take 600 mg by mouth 2 (two) times daily.    [provider]  HYDROcodone-acetaminophen (NORCO) 5-325 MG tablet Take 1 tablet by mouth daily.    [provider]  hydrocortisone (ANUSOL-HC) 2.5 % rectal cream Place 1 application rectally 3 (three) times daily as needed for hemorrhoids or itching.    [provider]  Insulin Glargine (LANTUS) 100 UNIT/ML Solostar Pen Inject 10 Units into the skin every evening.    [provider]  ipratropium-albuterol (DUONEB)  0.5-2.5 (3) MG/3ML SOLN Take 3 mLs by nebulization 2 (two) times daily.    [provider]  metFORMIN (GLUCOPHAGE) 1000 MG tablet Take 1,000 mg by mouth 2 (two) times daily with a meal.    [provider]  metolazone (ZAROXOLYN) 5 MG tablet Take 1 tablet (5 mg total) by mouth once a week. 10/31/16 01/29/17  Nahser, Wonda Cheng, MD  metoprolol tartrate (LOPRESSOR) 25 MG tablet Take 25 mg by mouth 2 (two) times daily.    [provider]  nitroGLYCERIN (NITROSTAT) 0.4 MG SL tablet Place 0.4 mg under the tongue every 5 (five) minutes as needed for chest pain.    [provider]  nystatin (MYCOSTATIN/NYSTOP) powder Apply topically 3 (three) times daily. 08/14/16   Burgess Estelle, MD  pantoprazole (PROTONIX) 40 MG tablet Take 40 mg by mouth daily.    [provider]  PARoxetine (PAXIL) 40 MG tablet Take 40 mg by mouth daily.     [provider]  polyvinyl alcohol (LIQUIFILM TEARS) 1.4 % ophthalmic solution Place 1 drop into both eyes 2 (two) times daily as needed for dry eyes. Natural Tears    [provider]  potassium chloride SA (K-DUR,KLOR-CON) 20 MEQ tablet Take 1 tablet (20 mEq total) by mouth daily. 10/12/16   Richardson Dopp T, PA-C  sucralfate (CARAFATE) 1 GM/10ML suspension Take 10 mLs (1 g total) by mouth 4 (four) times daily -  with meals and at bedtime. 08/14/16   Burgess Estelle, MD    Inpatient Medications: Scheduled Meds:  Continuous Infusions:  PRN Meds:   Allergies:    Allergies  Allergen Reactions  . Penicillins Hives, Nausea And Vomiting, Swelling and Other (See Comments)    Has patient had a PCN reaction causing immediate rash, facial/tongue/throat swelling, SOB or lightheadedness with hypotension: YES Has patient had a PCN reaction causing severe rash involving mucus membranes or skin necrosis: No Has patient had a PCN reaction that required hospitalization No Has patient had a PCN reaction occurring within the last 10  years: No If all of the above answers are "NO", then may proceed with Cephalosporin use.     Social History:   Social History   Socioeconomic History  .  Marital status: Single    Spouse name: Not on file  . Number of children: 0  . Years of education: Not on file  . Highest education level: Not on file  Social Needs  . Financial resource strain: Not on file  . Food insecurity - worry: Not on file  . Food insecurity - inability: Not on file  . Transportation needs - medical: Not on file  . Transportation needs - non-medical: Not on file  Occupational History    Employer: DISABLED  Tobacco Use  . Smoking status: Former Smoker    Packs/day: 1.00    Years: 42.00    Pack years: 42.00    Types: Cigarettes, Pipe    Last attempt to quit: 11/10/2010    Years since quitting: 6.8  . Smokeless tobacco: Former Systems developer    Quit date: 10/02/2010  Substance and Sexual Activity  . Alcohol use: No    Alcohol/week: 0.0 oz    Comment: " last drink was 2003" 08/11/15  . Drug use: No  . Sexual activity: Not on file  Other Topics Concern  . Not on file  Social History Narrative   Released from prison in 2003 after serving 2 years for indecency with a minor.    He smokes cigarettes greater than 30 years, also smoked a pipe.  Quit tobacco 09/2010.   Denies EtOH and drugs.     Family History:    Family History  Problem Relation Age of Onset  . Heart disease Mother   . Hypertension Sister   . Heart disease Brother   . Heart disease Father   . Peripheral vascular disease Father   . Heart disease Maternal Grandmother   . Heart attack Maternal Grandmother   . Breast cancer Maternal Grandmother   . Stomach cancer Maternal Uncle   . Colon cancer Neg Hx   . Stroke Neg Hx      ROS:  Please see the history of present illness.   All other ROS reviewed and negative.     Physical Exam/Data:   Vitals:   09/15/17 0900 09/15/17 0924 09/15/17 0930  BP: (!) 86/54  (!) 75/64  Pulse: (!) 114  (!)  121  Resp: 14  (!) 30  Temp: 99.6 F (37.6 C)    TempSrc: Oral    SpO2: 97%  97%  Weight:  174 lb (78.9 kg)   Height:  5' (1.524 m)     Intake/Output Summary (Last 24 hours) at 09/15/2017 0954 Last data filed at 09/15/2017 0858 Gross per 24 hour  Intake 500 ml  Output -  Net 500 ml   Filed Weights   09/15/17 0924  Weight: 174 lb (78.9 kg)   Body mass index is 33.98 kg/m.  General:  Disheveled appearing, in no acute distress, appears lethargic but responds to commands, normal work of breathing HEENT: normal Lymph: no adenopathy Neck: left CEA scar Endocrine:  No thryomegaly Vascular: No carotid bruits; FA pulses 2+ bilaterally without bruits  Cardiac:  Irregular rhythm, tachy/brady  Lungs:  clear to auscultation bilaterally, no wheezing, rhonchi or rales  Abd: soft, nontender, no hepatomegaly  Ext: no edema Musculoskeletal:  S/p right trans tarsal amputation Skin: warm and dry  Neuro:  CNs 2-12 intact, mentally handicap  Psych:  Normal affect   EKG:  The EKG was personally reviewed and demonstrates:  Afib with long sinus pauses, RBBB  Telemetry:  Telemetry was personally reviewed and demonstrates:  afib with intermittent long sinus pauses  Relevant CV Studies: 2D Echo 02/15/16 Study Conclusions  - Left ventricle: The cavity size was normal. Wall thickness was   increased in a pattern of mild LVH. Systolic function was mildly   reduced. The estimated ejection fraction was in the range of 45%   to 50%. Diffuse hypokinesis. - Aortic valve: Trileaflet; mildly thickened, mildly calcified   leaflets. - Aorta: The aorta was mildly calcified. - Left atrium: The atrium was mildly dilated. Anterior-posterior   dimension: 43 mm. - Pericardium, extracardiac: A trivial pericardial effusion was   identified posterior to the heart and along the left ventricular   free wall. Features were not consistent with tamponade   physiology.  Impressions:  - When compared to prior  echocardiogram in 11/2015, EF is mildly   reduced and heart rate is increased.  Laboratory Data:  Chemistry Recent Labs  Lab 09/15/17 0938  NA 136  K 3.6  CL 91*  GLUCOSE 140*  BUN 57*  CREATININE 1.70*    No results for input(s): PROT, ALBUMIN, AST, ALT, ALKPHOS, BILITOT in the last 168 hours. Hematology Recent Labs  Lab 09/15/17 0910 09/15/17 0938  WBC 8.0  --   RBC 3.87*  --   HGB 10.4* 10.9*  HCT 32.7* 32.0*  MCV 84.5  --   MCH 26.9  --   MCHC 31.8  --   RDW 14.3  --   PLT 281  --    Cardiac EnzymesNo results for input(s): TROPONINI in the last 168 hours.  Recent Labs  Lab 09/15/17 0935  TROPIPOC 0.03    BNPNo results for input(s): BNP, PROBNP in the last 168 hours.  DDimer No results for input(s): DDIMER in the last 168 hours.  Radiology/Studies:  Dg Chest Port 1 View  Result Date: 09/15/2017 CLINICAL DATA:  Shortness of breath EXAM: PORTABLE CHEST 1 VIEW COMPARISON:  08/31/2016 FINDINGS: Mild cardiomegaly. Status post CABG. There are increased markings at the bases that is likely primarily from atelectasis and interstitial crowding. Lung volumes are low. No Kerley lines or effusion. No air bronchogram. IMPRESSION: 1. Increased markings at the bases favoring atelectasis in the setting of low lung volumes. No air bronchogram or pulmonary edema. 2. Chronic cardiomegaly. Electronically Signed   By: Monte Fantasia M.D.   On: 09/15/2017 09:43    Assessment and Plan:   1. Tachy/Brady with Sinus Pauses: pacer pads in place. Will need admission to ICU/ progressive Care. Given other noncardiac medical problems and social issues, will ask IM to admit. Cardiology will follow closely. Hold home BB for washout and monitor closely on tele. He was on metoprolol 25 mg BID. Unsure when last dose was. Unable to elicit much history from pt. Replete hypokalemia. Check TSH and order 2D echo. Will reassess after BB washout. If no improvement, will consult EP for PPM. Hold home  amlodipine and diuretics given hypotension.    For questions or updates, please contact [place] Please consult [email or web address] for contact info under Cardiology/STEMI.   Signed, Lyda Jester, PA-C  09/15/2017 9:54 AM

## 2017-09-16 LAB — CBC
HCT: 31.2 % — ABNORMAL LOW (ref 39.0–52.0)
Hemoglobin: 9.7 g/dL — ABNORMAL LOW (ref 13.0–17.0)
MCH: 26.4 pg (ref 26.0–34.0)
MCHC: 31.1 g/dL (ref 30.0–36.0)
MCV: 85 fL (ref 78.0–100.0)
PLATELETS: 270 10*3/uL (ref 150–400)
RBC: 3.67 MIL/uL — AB (ref 4.22–5.81)
RDW: 14.6 % (ref 11.5–15.5)
WBC: 5.4 10*3/uL (ref 4.0–10.5)

## 2017-09-16 LAB — GLUCOSE, CAPILLARY
GLUCOSE-CAPILLARY: 105 mg/dL — AB (ref 65–99)
Glucose-Capillary: 109 mg/dL — ABNORMAL HIGH (ref 65–99)
Glucose-Capillary: 163 mg/dL — ABNORMAL HIGH (ref 65–99)
Glucose-Capillary: 120 mg/dL — ABNORMAL HIGH (ref 65–99)
Glucose-Capillary: 123 mg/dL — ABNORMAL HIGH (ref 65–99)

## 2017-09-16 LAB — BASIC METABOLIC PANEL
Anion gap: 17 — ABNORMAL HIGH (ref 5–15)
BUN: 56 mg/dL — AB (ref 6–20)
CALCIUM: 8.9 mg/dL (ref 8.9–10.3)
CO2: 25 mmol/L (ref 22–32)
CREATININE: 1.59 mg/dL — AB (ref 0.61–1.24)
Chloride: 93 mmol/L — ABNORMAL LOW (ref 101–111)
GFR calc non Af Amer: 45 mL/min — ABNORMAL LOW (ref >60)
GFR, EST AFRICAN AMERICAN: 52 mL/min — AB (ref >60)
Glucose, Bld: 81 mg/dL (ref 65–99)
Potassium: 3.6 mmol/L (ref 3.5–5.1)
SODIUM: 135 mmol/L (ref 135–145)

## 2017-09-16 LAB — TROPONIN I
TROPONIN I: 0.14 ng/mL — AB (ref <0.03)
Troponin I: 0.17 ng/mL (ref <0.03)

## 2017-09-16 LAB — HIV ANTIBODY (ROUTINE TESTING W REFLEX): HIV SCREEN 4TH GENERATION: NONREACTIVE

## 2017-09-16 MED ORDER — CAMPHOR-MENTHOL 0.5-0.5 % EX LOTN
TOPICAL_LOTION | CUTANEOUS | Status: DC | PRN
  Administered 2017-09-16 – 2017-09-23 (×2): via TOPICAL
  Filled 2017-09-16: qty 222

## 2017-09-16 MED ORDER — ORAL CARE MOUTH RINSE
15.0000 mL | Freq: Two times a day (BID) | OROMUCOSAL | Status: DC
  Administered 2017-09-16 – 2017-09-19 (×7): 15 mL via OROMUCOSAL

## 2017-09-16 MED ORDER — AMIODARONE HCL IN DEXTROSE 360-4.14 MG/200ML-% IV SOLN
60.0000 mg/h | INTRAVENOUS | Status: AC
  Administered 2017-09-16: 60 mg/h via INTRAVENOUS
  Filled 2017-09-16 (×2): qty 200

## 2017-09-16 MED ORDER — METOPROLOL SUCCINATE ER 25 MG PO TB24
25.0000 mg | ORAL_TABLET | Freq: Every day | ORAL | Status: DC
  Administered 2017-09-16 – 2017-09-17 (×2): 25 mg via ORAL
  Filled 2017-09-16 (×2): qty 1

## 2017-09-16 MED ORDER — AMIODARONE IV BOLUS ONLY 150 MG/100ML
150.0000 mg | Freq: Once | INTRAVENOUS | Status: AC
  Administered 2017-09-16: 150 mg via INTRAVENOUS

## 2017-09-16 MED ORDER — INFLUENZA VAC SPLIT QUAD 0.5 ML IM SUSY: 0.5000 mL | PREFILLED_SYRINGE | INTRAMUSCULAR | Status: DC

## 2017-09-16 MED ORDER — AMIODARONE HCL IN DEXTROSE 360-4.14 MG/200ML-% IV SOLN
30.0000 mg/h | INTRAVENOUS | Status: DC
  Administered 2017-09-17 – 2017-09-18 (×4): 30 mg/h via INTRAVENOUS
  Filled 2017-09-16 (×3): qty 200

## 2017-09-16 MED ORDER — GUAIFENESIN-DM 100-10 MG/5ML PO SYRP
5.0000 mL | ORAL_SOLUTION | ORAL | Status: DC | PRN
  Administered 2017-09-16: 5 mL via ORAL
  Filled 2017-09-16: qty 5

## 2017-09-16 NOTE — Progress Notes (Signed)
Medicine attending: I examined this patient today together with resident physician Dr. Neva Seat and I concur with his evaluation and management plan which we discussed together. 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  He is now normotensive.  Pulse rate 72.  Stable per cardiology to go back on low-dose beta-blocker.  With respect to anticoagulation, Pradaxa stopped back in 2015 when he had a GI hemorrhage.  No obvious source found.  Upper endoscopy and colonoscopy normal.  He had a "few tiny AVMs" in the proximal small bowel seen on capsule endoscopy.  I think it would be safer with respect to his stroke risk to put him back on anticoagulation then to keep him on aspirin.  We will need to discuss with his other physicians.  Having a single bleeding complication from an anticoagulant is not a contraindication to resuming anticoagulation.  Given his borderline renal function, drug of choice would be apixaban.

## 2017-09-16 NOTE — Progress Notes (Addendum)
Subjective: Patient seen resting in his bed this morning. Appears lethargic. States he is feeling better, but still has some shortness of breath. Temporary transvenous pacemaker is in place through L femoral. Patient noted to be in NSR with improved blood pressure on monitor.  Objective:  Vital signs in last 24 hours: Vitals:   09/16/17 0750 09/16/17 0800 09/16/17 0900 09/16/17 1000  BP:  123/65 (!) 122/59 140/75  Pulse:  69 69   Resp:  (!) 26 (!) 30 (!) 23  Temp: 98.4 F (36.9 C)     TempSrc: Oral     SpO2:  99% 98%   Weight:      Height:       Physical Exam  Constitutional: He appears well-developed and well-nourished.  Drowsy appearing  HENT:  Head: Normocephalic and atraumatic.  Eyes: EOM are normal. Right eye exhibits no discharge. Left eye exhibits no discharge.  Cardiovascular: Normal rate, regular rhythm, normal heart sounds and intact distal pulses.  Pulmonary/Chest: Effort normal and breath sounds normal. No respiratory distress.  Abdominal: Soft. Bowel sounds are normal. He exhibits no distension.  Musculoskeletal: He exhibits no edema.  R Mid foot Amputation  Neurological: He is alert.  Skin: Skin is warm and dry.  Venous Stasis Changes Bilat LE Excoriations LLE     Assessment/Plan: Assessment & Plan by Problem: 62 yo M with History of Paroxysmal A. Fib, CHF (Last Echo 11/16/15 EF40-50%,  CAD (s/p CABG 2017) , Carotid stenosis (S/P L Carotid Endarterectomy), CVA, COPD, HTN, Abdominal Hernia, and Diabetes presented via [facility] with Generalized weakness and was found to be in A. Fib with RVR and having intermittent 6-10 second sinus pauses.  Tachy-Brady Syndrome with 6-10 second pauses, Atrial Fibrillation with RVR in the setting of Influenza A: Patient noted to be in A. Fib with RVR on telemetry in ED with intermittent sinus pauses of 6-10 seconds. Patient received TTVP yesterday and is noted to be in NSR this morning. Troponin Trend 0.02>>0.12>>0.12. - Appreciate  Cardiology and Electrophysiology Recommendations - Patient to receive permanent pacemaker this admission, will keep TTVP in place - Resume Metoprolol 25mg  Daily - Trend troponin to confirm downtrend - Cardiac Monitoring - Echocardiogram - AM BMP and CBC  AKI: Improving. Cr 1.7>>1.59 (Baseline unclear, last normal value 10/2016). Likely decreased oral intake and insensible losses in a patient on Lasix and Metolazone. - Hold home Lasix, Metolazone - Continue oral hydration  Hypotension, History of HTN:  Resolved. Patient Currently Normotensive.. Do not suspect Cardiogenic shock as patient has warm limbs and good pulse pressure. - Continue Fludrocortisone 0.1mg  Daily - Resume Metoprolol 25mg  Daily  Influenza A: Patient is flu A positive. He reported 1 day of coughing and shortness of breath and sick contact of his roommate who recently had the flu - Droplet precautions - Tamiflu 30mg  BID x 5d (Starting 09/15/17) - Supportive care PRN  Chest Wall Pain: Improving. Still some pain with palpation and cough. - Pain relief PRN - Monitor  HFrEF: 02/15/16 EF 45%-50%. Diffuse hypokinesis.  - Resume Metoprolol as above - Holding Lasix and Metolazlone 2/2 Hypotension - Echocaridogram  DM: Stable. On metformin 1000mg  BID and Lantus 10U qhs. - Hold Metformin - Lantus 10U qhs - SSI-S  Hypokalemia: Resolved. - Holding home daily KCl while holding diuretics - Mg 1.7  CAD: S/P CABG x3 08/2015.  - Continue Atorvastatin, ASA - Resome Metoprolol as above - Nitroglycerin PRN  COPD: - Continue Duoneb BID PRN  Anxiety, Sciatica, ?Chronic Pain. -  Continue Home Clonazepam 0.5 qhs, Gabapentin 600mg  BID, Norco 5-325, Paroxetine 40mg  Daily  FEN: HH DVT Prophylaxis: Lovenox Code Status: Full  Dispo: Anticipated discharge pending pacemaker placement   Neva Seat, MD 09/16/2017, 10:16 AM Pager: [phone]

## 2017-09-16 NOTE — Progress Notes (Addendum)
Patient in rapid Afib, HR up to 130-140's.  BP stable, O2 needs increased and patient increased to 6L O2 with sats of 92%.  Patient arousable & asked to cough and to bear down in attempts to break rhythm.  EKG obtained, MD notified and new orders received to give 150 mg bolus of Amio and to start Amio infusion if bolus does not convert patient.  [place], Ardeth Sportsman

## 2017-09-16 NOTE — Progress Notes (Signed)
Patient ID: [name], male   DOB: 14-Aug-1955, 62 y.o.   MRN: [id number]   Progress Note  Patient Name: [name] Date of Encounter: 09/16/2017  Primary Cardiologist: No primary care provider on file.   Subjective    Temporary pacemaker placed via right femoral yesterday.  He feels fatigued today.  No longer hypotensive.  He is afebrile.   TTVP tested: capturing normally.  Loss of capture at 0.2 mA. Output left at 2 mA.   Inpatient Medications    Scheduled Meds: . aspirin EC  81 mg Oral Daily  . atorvastatin  80 mg Oral Daily  . clonazePAM  0.5 mg Oral QHS  . enoxaparin (LOVENOX) injection  40 mg Subcutaneous Q24H  . fludrocortisone  0.1 mg Oral Daily  . gabapentin  600 mg Oral BID  . insulin aspart  0-9 Units Subcutaneous TID WC  . insulin glargine  10 Units Subcutaneous QHS  . ipratropium-albuterol  3 mL Nebulization BID  . mouth rinse  15 mL Mouth Rinse BID  . metoprolol succinate  25 mg Oral Daily  . oseltamivir  30 mg Oral BID  . pantoprazole  40 mg Oral Daily  . PARoxetine  40 mg Oral Daily  . sodium chloride flush  3 mL Intravenous Q12H   Continuous Infusions: . sodium chloride     PRN Meds: sodium chloride, acetaminophen, HYDROcodone-acetaminophen, nitroGLYCERIN, ondansetron (ZOFRAN) IV, polyvinyl alcohol, sodium chloride flush   Vital Signs    Vitals:   09/16/17 0700 09/16/17 0745 09/16/17 0750 09/16/17 0800  BP: (!) 131/57   123/65  Pulse: 67   69  Resp: (!) 9   (!) 26  Temp:   98.4 F (36.9 C)   TempSrc:   Oral   SpO2: 100% 100%  99%  Weight: 171 lb 4.8 oz (77.7 kg)     Height:        Intake/Output Summary (Last 24 hours) at 09/16/2017 0832 Last data filed at 09/16/2017 0800 Gross per 24 hour  Intake 2260 ml  Output 510 ml  Net 1750 ml   Filed Weights   09/15/17 0924 09/16/17 0700  Weight: 174 lb (78.9 kg) 171 lb 4.8 oz (77.7 kg)    Telemetry    NSR with runs of afib/RVR yesterday as well as pauses up to 10 sec- Personally  Reviewed   Physical Exam   GEN: Chronically ill-appearing.   Neck: No JVD Cardiac: RRR, no murmurs, rubs, or gallops.  Respiratory: Clear to auscultation bilaterally. GI: Soft, nontender, non-distended  MS: No edema; No deformity. Neuro:  Nonfocal  Psych: Normal affect   Labs    Chemistry Recent Labs  Lab 09/15/17 0910 09/15/17 0938 09/16/17 0200  NA 135 136 135  K 3.4* 3.6 3.6  CL 92* 91* 93*  CO2 28  --  25  GLUCOSE 145* 140* 81  BUN 55* 57* 56*  CREATININE 1.69* 1.70* 1.59*  CALCIUM 9.2  --  8.9  GFRNONAA 41*  --  45*  GFRAA 48*  --  52*  ANIONGAP 15  --  17*     Hematology Recent Labs  Lab 09/15/17 0910 09/15/17 0938 09/16/17 0200  WBC 8.0  --  5.4  RBC 3.87*  --  3.67*  HGB 10.4* 10.9* 9.7*  HCT 32.7* 32.0* 31.2*  MCV 84.5  --  85.0  MCH 26.9  --  26.4  MCHC 31.8  --  31.1  RDW 14.3  --  14.6  PLT 281  --  270    Cardiac Enzymes Recent Labs  Lab 09/15/17 1639 09/15/17 2103  TROPONINI 0.12* 0.12*    Recent Labs  Lab 09/15/17 0935  TROPIPOC 0.03     BNPNo results for input(s): BNP, PROBNP in the last 168 hours.   DDimer No results for input(s): DDIMER in the last 168 hours.   Radiology    Dg Chest Port 1 View  Result Date: 09/15/2017 CLINICAL DATA:  Shortness of breath EXAM: PORTABLE CHEST 1 VIEW COMPARISON:  08/31/2016 FINDINGS: Mild cardiomegaly. Status post CABG. There are increased markings at the bases that is likely primarily from atelectasis and interstitial crowding. Lung volumes are low. No Kerley lines or effusion. No air bronchogram. IMPRESSION: 1. Increased markings at the bases favoring atelectasis in the setting of low lung volumes. No air bronchogram or pulmonary edema. 2. Chronic cardiomegaly. Electronically Signed   By: Monte Fantasia M.D.   On: 09/15/2017 09:43    Patient Profile     62 y.o. male with history of CAD s/p CABG, ischemic cardiomyopathy, CVA/carotid disease, PAD, COPD, PAF was admitted with influenza and  noted to have afib/RVR interspersed with long pauses.   Assessment & Plan    1. ID: Influenza A.  Treating with oseltamivir per primary service.  Afebrile.  BP stable today. 2. Tachy-brady syndrome: Patient alternates afib/RVR with NSR with episodes of long sinus pauses.  Had 10 second pause yesterday with TTVP placed.   - He has not been anticoagulated prior to admission due to GI bleeding from AVMs.  - With tachy-brady syndrome, I think he will need PPM placed next week when recovered from influenza.  Leave TTVP in for now.  TTVP working properly.  - With episodes of afib/RVR, will start back on Toprol XL 25 mg daily now that TTVP in place and BP stable.  I think he is going to need PPM regardless so not concerned about BB "washout" at this point.  3. Chronic systolic CHF: Ischemic cardiomyopathy.  Last echo in 2017 with EF 45-50%.   - Needs echo this admission, will order.  4. CAD: s/p CABG.  He is on ASA 81 and statin.  No chest pain.  5. CVA: history of CVA.  He has carotid disease as well as PAF.  He was not anticoagulated prior to admission due to GI bleeding.  Currently on ASA 81.  6. CKD: Stage 3. Baseline creatinine appears to be about 1.5.    For questions or updates, please contact [place] Please consult [email or web address] for contact info under Cardiology/STEMI.      Signed, Loralie Champagne, MD  09/16/2017, 8:32 AM

## 2017-09-17 ENCOUNTER — Inpatient Hospital Stay (HOSPITAL_COMMUNITY): Payer: Medicare (Managed Care)

## 2017-09-17 DIAGNOSIS — J111 Influenza due to unidentified influenza virus with other respiratory manifestations: Secondary | ICD-10-CM | POA: Diagnosis present

## 2017-09-17 DIAGNOSIS — I5043 Acute on chronic combined systolic (congestive) and diastolic (congestive) heart failure: Secondary | ICD-10-CM

## 2017-09-17 LAB — BASIC METABOLIC PANEL
Anion gap: 15 (ref 5–15)
BUN: 53 mg/dL — AB (ref 6–20)
CALCIUM: 9 mg/dL (ref 8.9–10.3)
CHLORIDE: 96 mmol/L — AB (ref 101–111)
CO2: 28 mmol/L (ref 22–32)
CREATININE: 1.68 mg/dL — AB (ref 0.61–1.24)
GFR calc non Af Amer: 42 mL/min — ABNORMAL LOW (ref >60)
GFR, EST AFRICAN AMERICAN: 48 mL/min — AB (ref >60)
Glucose, Bld: 143 mg/dL — ABNORMAL HIGH (ref 65–99)
Potassium: 3.3 mmol/L — ABNORMAL LOW (ref 3.5–5.1)
SODIUM: 139 mmol/L (ref 135–145)

## 2017-09-17 LAB — PROCALCITONIN: Procalcitonin: 0.16 ng/mL

## 2017-09-17 LAB — GLUCOSE, CAPILLARY
GLUCOSE-CAPILLARY: 125 mg/dL — AB (ref 65–99)
Glucose-Capillary: 121 mg/dL — ABNORMAL HIGH (ref 65–99)
Glucose-Capillary: 126 mg/dL — ABNORMAL HIGH (ref 65–99)
Glucose-Capillary: 97 mg/dL (ref 65–99)

## 2017-09-17 LAB — HEPARIN LEVEL (UNFRACTIONATED): HEPARIN UNFRACTIONATED: 0.32 [IU]/mL (ref 0.30–0.70)

## 2017-09-17 LAB — TROPONIN I: Troponin I: 0.16 ng/mL (ref <0.03)

## 2017-09-17 MED ORDER — METOPROLOL TARTRATE 5 MG/5ML IV SOLN
2.5000 mg | Freq: Once | INTRAVENOUS | Status: AC
  Administered 2017-09-17: 2.5 mg via INTRAVENOUS

## 2017-09-17 MED ORDER — FUROSEMIDE 10 MG/ML IJ SOLN: 40.0000 mg | Freq: Once | INTRAMUSCULAR | Status: DC

## 2017-09-17 MED ORDER — METOPROLOL TARTRATE 5 MG/5ML IV SOLN: 5.0000 mg | Freq: Once | INTRAVENOUS | Status: DC

## 2017-09-17 MED ORDER — HEPARIN (PORCINE) IN NACL 100-0.45 UNIT/ML-% IJ SOLN
1350.0000 [IU]/h | INTRAMUSCULAR | Status: DC
  Administered 2017-09-17 – 2017-09-18 (×2): 1050 [IU]/h via INTRAVENOUS
  Administered 2017-09-19: 1200 [IU]/h via INTRAVENOUS
  Administered 2017-09-20 – 2017-09-21 (×2): 1350 [IU]/h via INTRAVENOUS
  Administered 2017-09-21: 1600 [IU]/h via INTRAVENOUS
  Administered 2017-09-22 – 2017-09-23 (×2): 1450 [IU]/h via INTRAVENOUS
  Administered 2017-09-24 – 2017-09-28 (×5): 1300 [IU]/h via INTRAVENOUS
  Administered 2017-09-29 – 2017-09-30 (×3): 1350 [IU]/h via INTRAVENOUS
  Filled 2017-09-17 (×17): qty 250

## 2017-09-17 MED ORDER — POTASSIUM CHLORIDE CRYS ER 20 MEQ PO TBCR
40.0000 meq | EXTENDED_RELEASE_TABLET | Freq: Once | ORAL | Status: AC
  Administered 2017-09-17: 40 meq via ORAL
  Filled 2017-09-17: qty 2

## 2017-09-17 MED ORDER — METOPROLOL TARTRATE 5 MG/5ML IV SOLN
INTRAVENOUS | Status: AC
  Administered 2017-09-17: 2.5 mg via INTRAVENOUS
  Filled 2017-09-17: qty 5

## 2017-09-17 MED ORDER — FUROSEMIDE 10 MG/ML IJ SOLN
40.0000 mg | Freq: Two times a day (BID) | INTRAMUSCULAR | Status: AC
  Administered 2017-09-17 (×2): 40 mg via INTRAVENOUS
  Filled 2017-09-17 (×2): qty 4

## 2017-09-17 MED ORDER — POTASSIUM CHLORIDE CRYS ER 20 MEQ PO TBCR: 20.0000 meq | EXTENDED_RELEASE_TABLET | Freq: Two times a day (BID) | ORAL | Status: DC

## 2017-09-17 MED ORDER — METOPROLOL TARTRATE 25 MG PO TABS
25.0000 mg | ORAL_TABLET | Freq: Three times a day (TID) | ORAL | Status: DC
  Administered 2017-09-17 – 2017-09-19 (×3): 25 mg via ORAL
  Filled 2017-09-17 (×4): qty 1

## 2017-09-17 NOTE — Progress Notes (Addendum)
Patient is sustaining in Afib with HR 154-115. Patient denies shortness of breath, chest pain/pressure, or heart racing at this time. BP 93/67 (76). O2 sat 90% on O2 @ 8L via HFNC. Dr. Berline Lopes notified and EKG ordered and obtained.

## 2017-09-17 NOTE — Progress Notes (Signed)
Was paged by nurse stating that [name] was desaturating into the low 80s on 6L [place]. Went to bedside to evaluate. The patient was found asleep and was not in any acute distress. He stated that he denied any difficulty breathing, chest pain or any acute pain. He felt uncomfortable with his ventimask on and saturating at 95% on 60% ventimask.   Physical exam- Alert, oriented to place BP=121/58 Body habitus limited visualization of neck veins Regular rate approximately in 60s, normal rhythm, no murmurs heard No wheezing appreciated, faint crackles in bilateral bases, no accessory muscle use   -venti mask applied  -Chest x-ray ordered  Lars Mage, MD Internal Medicine PGY1 Pager:[phone] 09/17/2017, 3:56 AM

## 2017-09-17 NOTE — Progress Notes (Signed)
ANTICOAGULATION CONSULT NOTE - Follow Up Consult  Pharmacy Consult for Heparin  Indication: atrial fibrillation  Allergies  Allergen Reactions  . Penicillins Hives, Nausea And Vomiting, Swelling and Other (See Comments)    Has patient had a PCN reaction causing immediate rash, facial/tongue/throat swelling, SOB or lightheadedness with hypotension: YES Has patient had a PCN reaction causing severe rash involving mucus membranes or skin necrosis: No Has patient had a PCN reaction that required hospitalization No Has patient had a PCN reaction occurring within the last 10 years: No If all of the above answers are "NO", then may proceed with Cephalosporin use.     Patient Measurements: Height: 5' (152.4 cm) Weight: 171 lb 1.2 oz (77.6 kg) IBW/kg (Calculated) : 50   Vital Signs: Temp: 99.6 F (37.6 C) (02/10 1139) Temp Source: Oral (02/10 1139) BP: 131/56 (02/10 1400) Pulse Rate: 69 (02/10 1400)  Labs: Recent Labs    09/15/17 0910 09/15/17 0938  09/16/17 0200 09/16/17 1159 09/16/17 1754 09/17/17 0436 09/17/17 1332  HGB 10.4* 10.9*  --  9.7*  --   --   --   --   HCT 32.7* 32.0*  --  31.2*  --   --   --   --   PLT 281  --   --  270  --   --   --   --   HEPARINUNFRC  --   --   --   --   --   --   --  0.32  CREATININE 1.69* 1.70*  --  1.59*  --   --  1.68*  --   TROPONINI  --   --    < >  --  0.14* 0.17* 0.16*  --    < > = values in this interval not displayed.    Estimated Creatinine Clearance: 38.8 mL/min (A) (by C-G formula based on SCr of 1.68 mg/dL (H)).   Medical History: Past Medical History:  Diagnosis Date  . Abdominal hernia    Chronic, not a good surgical candidate  . Abscess, abdomen 12/31/2010   Referred to Wound Care in 01/2011 because of multiple abd abscess with VERY large ventral hernia (please look at image of CT abd/pelvis 09/2010).  Because of hernia I was hesitant to I&D.      Marland Kitchen Anemia    History of Iron Def Anemia  . Anxiety   . AVM  (arteriovenous malformation)    chronic GI blood loss  . Carotid artery disease ([place])    s/p CEA  . Chronic diastolic CHF (congestive heart failure) (HCC)    takes Lasix  . Chronic low back pain   . COPD (chronic obstructive pulmonary disease) ([place])   . CVA (cerebral infarction) 09/2010   Bilateral with Left > Right  . Diabetes mellitus   . GERD (gastroesophageal reflux disease)   . History of nuclear stress test    Myoview 9/16:  Inferior, apical and inf-lateral ischemia; not gated; High Risk  . Hx of echocardiogram    Echo 5/16:  Mild LVH, EF 55%, indeterm. diast function, WMA could not be ruled out, MAC, trivial MR, mild LAE, normal RVF //  b. Echo 4/17: EF 55-60%, no RWMA, Gr 2 DD, Ao sclerosis, MAC, mild MS, mild LAE, PASP 55 mHg  . Hyperlipidemia   . Hypertension   . Intellectual disability    Sister helps to take care of him and takes him to appts  . Itching    all over  body; pt scratches and has sores on bilateral arms and abdomen  . Lung nodule   . Myocardial infarction [facility]) 2016 ?   Heart attack  (  Per  pt. )  . Obesity   . Oxygen dependent    wears 2 liters at bedtime and when needed  . PAF (paroxysmal atrial fibrillation) ([place]) 06/2009   CHADS score 2 (HTN, DM) // Pradaxa for Afib // Pradaxa stopped due to worsening anemia (Hgb in 5/18 8.5 >> Pradaxa remains on hold)   . Pneumonia    hx of  . Stroke ([place])   . Tobacco user    Smokes 1ppd for multiple years.  Quit after hosp 09/2010.  . Tubular adenoma of colon   . Wears glasses     Assessment: 61yom admitted with flu.  Hx Afib previously in anticoagulation but stopped d/t GIB in 2015.  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  Plan to anticoagulate with heparin in meantime and apixaban post PPM.   Will keep HL at low end of goal with GIB Hx, h/h low stable - watch Heparin drip 1050 uts/hr HL 0.32 at goal   Goal of Therapy:  Heparin level 0.3-0.7 units/ml Monitor  platelets by anticoagulation protocol: Yes   Plan:  Continue Heparin drip 1050 uts/hr  Daily HL, CBC  Bonnita Nasuti Pharm.D. CPP, BCPS Clinical Pharmacist [phone] 09/17/2017 3:45 PM

## 2017-09-17 NOTE — Progress Notes (Signed)
RT note-sp02 86%, on VM 35%. Nebulizer given at this time, and fio2 increased 55%.

## 2017-09-17 NOTE — Progress Notes (Signed)
Patient continues to sustain in afib with RVR HR 153-130's. Patient asymptomatic. Cards Fellow Dr. Charissa Bash paged.

## 2017-09-17 NOTE — Progress Notes (Signed)
ANTICOAGULATION CONSULT NOTE - Initial Consult  Pharmacy Consult for Heparin  Indication: atrial fibrillation  Allergies  Allergen Reactions  . Penicillins Hives, Nausea And Vomiting, Swelling and Other (See Comments)    Has patient had a PCN reaction causing immediate rash, facial/tongue/throat swelling, SOB or lightheadedness with hypotension: YES Has patient had a PCN reaction causing severe rash involving mucus membranes or skin necrosis: No Has patient had a PCN reaction that required hospitalization No Has patient had a PCN reaction occurring within the last 10 years: No If all of the above answers are "NO", then may proceed with Cephalosporin use.     Patient Measurements: Height: 5' (152.4 cm) Weight: 171 lb 4.8 oz (77.7 kg) IBW/kg (Calculated) : 50   Vital Signs: Temp: 98.7 F (37.1 C) (02/10 0745) Temp Source: Oral (02/10 0745) BP: 123/78 (02/10 0700) Pulse Rate: 88 (02/10 0700)  Labs: Recent Labs    09/15/17 0910 09/15/17 0938  09/16/17 0200 09/16/17 1159 09/16/17 1754 09/17/17 0436  HGB 10.4* 10.9*  --  9.7*  --   --   --   HCT 32.7* 32.0*  --  31.2*  --   --   --   PLT 281  --   --  270  --   --   --   CREATININE 1.69* 1.70*  --  1.59*  --   --  1.68*  TROPONINI  --   --    < >  --  0.14* 0.17* 0.16*   < > = values in this interval not displayed.    Estimated Creatinine Clearance: 38.9 mL/min (A) (by C-G formula based on SCr of 1.68 mg/dL (H)).   Medical History: Past Medical History:  Diagnosis Date  . Abdominal hernia    Chronic, not a good surgical candidate  . Abscess, abdomen 12/31/2010   Referred to Wound Care in 01/2011 because of multiple abd abscess with VERY large ventral hernia (please look at image of CT abd/pelvis 09/2010).  Because of hernia I was hesitant to I&D.      Marland Kitchen Anemia    History of Iron Def Anemia  . Anxiety   . AVM (arteriovenous malformation)    chronic GI blood loss  . Carotid artery disease ([place])    s/p CEA  .  Chronic diastolic CHF (congestive heart failure) (HCC)    takes Lasix  . Chronic low back pain   . COPD (chronic obstructive pulmonary disease) ([place])   . CVA (cerebral infarction) 09/2010   Bilateral with Left > Right  . Diabetes mellitus   . GERD (gastroesophageal reflux disease)   . History of nuclear stress test    Myoview 9/16:  Inferior, apical and inf-lateral ischemia; not gated; High Risk  . Hx of echocardiogram    Echo 5/16:  Mild LVH, EF 55%, indeterm. diast function, WMA could not be ruled out, MAC, trivial MR, mild LAE, normal RVF //  b. Echo 4/17: EF 55-60%, no RWMA, Gr 2 DD, Ao sclerosis, MAC, mild MS, mild LAE, PASP 55 mHg  . Hyperlipidemia   . Hypertension   . Intellectual disability    Sister helps to take care of him and takes him to appts  . Itching    all over body; pt scratches and has sores on bilateral arms and abdomen  . Lung nodule   . Myocardial infarction [facility]) 2016 ?   Heart attack  (  Per  pt. )  . Obesity   . Oxygen dependent  wears 2 liters at bedtime and when needed  . PAF (paroxysmal atrial fibrillation) ([place]) 06/2009   CHADS score 2 (HTN, DM) // Pradaxa for Afib // Pradaxa stopped due to worsening anemia (Hgb in 5/18 8.5 >> Pradaxa remains on hold)   . Pneumonia    hx of  . Stroke ([place])   . Tobacco user    Smokes 1ppd for multiple years.  Quit after hosp 09/2010.  . Tubular adenoma of colon   . Wears glasses     Assessment: 61yom admitted with flu.  Hx Afib previously in anticoagulation but stopped d/t GIB in 2015.  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  Plan to anticoagulate with heparin in meantime and apixaban post PPM.   Will keep HL at low end of goal with GIB Hx, h/h low stable - watch  Goal of Therapy:  Heparin level 0.3-0.7 units/ml Monitor platelets by anticoagulation protocol: Yes   Plan:  Stop enoxaparin No bolus  Heparin drip 1050 uts/hr  HL in 6hr Daily HL, CBC  Bonnita Nasuti  Pharm.D. CPP, BCPS Clinical Pharmacist [phone] 09/17/2017 8:32 AM

## 2017-09-17 NOTE — Progress Notes (Signed)
Patient ID: [name], male   DOB: 30-Aug-1955, 62 y.o.   MRN: [id number]   Progress Note  Patient Name: [name] Date of Encounter: 09/17/2017  Primary Cardiologist: No primary care provider on file.   Subjective    Temporary pacemaker placed via right femoral 2/8.  Yesterday had more atrial fibrillation with RVR and amiodarone gtt started. Now in NSR.  He has not had any further pauses.    Increased oxygen requirement overnight, now on venti mask.  He is afebrile.  Reports dyspnea. CXR with ?PNA left base.   TTVP tested: capturing normally.  Loss of capture at 0.4 mA. Output left at 2 mA.   Inpatient Medications    Scheduled Meds: . aspirin EC  81 mg Oral Daily  . atorvastatin  80 mg Oral Daily  . clonazePAM  0.5 mg Oral QHS  . enoxaparin (LOVENOX) injection  40 mg Subcutaneous Q24H  . fludrocortisone  0.1 mg Oral Daily  . furosemide  40 mg Intravenous Once  . gabapentin  600 mg Oral BID  . Influenza vac split quadrivalent PF  0.5 mL Intramuscular Tomorrow-1000  . insulin aspart  0-9 Units Subcutaneous TID WC  . insulin glargine  10 Units Subcutaneous QHS  . ipratropium-albuterol  3 mL Nebulization BID  . mouth rinse  15 mL Mouth Rinse BID  . metoprolol succinate  25 mg Oral Daily  . oseltamivir  30 mg Oral BID  . pantoprazole  40 mg Oral Daily  . PARoxetine  40 mg Oral Daily  . potassium chloride  40 mEq Oral Once  . sodium chloride flush  3 mL Intravenous Q12H   Continuous Infusions: . sodium chloride 250 mL (09/16/17 2000)  . amiodarone 30 mg/hr (09/16/17 2124)   PRN Meds: sodium chloride, acetaminophen, camphor-menthol, guaiFENesin-dextromethorphan, HYDROcodone-acetaminophen, nitroGLYCERIN, ondansetron (ZOFRAN) IV, polyvinyl alcohol, sodium chloride flush   Vital Signs    Vitals:   09/17/17 0600 09/17/17 0700 09/17/17 0733 09/17/17 0745  BP: 119/74 123/78    Pulse: (!) 50 88    Resp: (!) 22 (!) 37    Temp:    98.7 F (37.1 C)  TempSrc:    Oral   SpO2: 91% (!) 89% 92%   Weight:      Height:        Intake/Output Summary (Last 24 hours) at 09/17/2017 0803 Last data filed at 09/17/2017 0700 Gross per 24 hour  Intake 1681.24 ml  Output 1180 ml  Net 501.24 ml   Filed Weights   09/15/17 0924 09/16/17 0700  Weight: 174 lb (78.9 kg) 171 lb 4.8 oz (77.7 kg)    Telemetry    NSR with runs of afib/RVR yesterday: Personally Reviewed   Physical Exam   General: NAD Neck: Thick, JVP difficult but ?9-10 cm, no thyromegaly or thyroid nodule.  Lungs: Crackles at bases bilaterally CV: Nondisplaced PMI.  Heart regular S1/S2, no S3/S4, no murmur.  No peripheral edema.   Abdomen: Soft, nontender, no hepatosplenomegaly, no distention.  Skin: Intact without lesions or rashes.  Neurologic: Alert and oriented x 3.  Psych: Normal affect. Extremities: No clubbing or cyanosis. Right groin TTVP HEENT: Normal.    Labs    Chemistry Recent Labs  Lab 09/15/17 0910 09/15/17 0938 09/16/17 0200 09/17/17 0436  NA 135 136 135 139  K 3.4* 3.6 3.6 3.3*  CL 92* 91* 93* 96*  CO2 28  --  25 28  GLUCOSE 145* 140* 81 143*  BUN 55* 57* 56*  53*  CREATININE 1.69* 1.70* 1.59* 1.68*  CALCIUM 9.2  --  8.9 9.0  GFRNONAA 41*  --  45* 42*  GFRAA 48*  --  52* 48*  ANIONGAP 15  --  17* 15     Hematology Recent Labs  Lab 09/15/17 0910 09/15/17 0938 09/16/17 0200  WBC 8.0  --  5.4  RBC 3.87*  --  3.67*  HGB 10.4* 10.9* 9.7*  HCT 32.7* 32.0* 31.2*  MCV 84.5  --  85.0  MCH 26.9  --  26.4  MCHC 31.8  --  31.1  RDW 14.3  --  14.6  PLT 281  --  270    Cardiac Enzymes Recent Labs  Lab 09/15/17 2103 09/16/17 1159 09/16/17 1754 09/17/17 0436  TROPONINI 0.12* 0.14* 0.17* 0.16*    Recent Labs  Lab 09/15/17 0935  TROPIPOC 0.03     BNPNo results for input(s): BNP, PROBNP in the last 168 hours.   DDimer No results for input(s): DDIMER in the last 168 hours.   Radiology    Dg Chest Port 1 View  Result Date: 09/17/2017 CLINICAL DATA:   62 y/o  M; dyspnea. EXAM: PORTABLE CHEST 1 VIEW COMPARISON:  09/15/2017 chest radiograph FINDINGS: Stable cardiomegaly given projection and technique. Post median sternotomy with wires in alignment. Aortic atherosclerosis with calcification. Increased left lung base opacity. Possible small left effusion. No pneumothorax. No acute osseous abnormality is evident. IMPRESSION: Increased left lung base opacity may represent atelectasis or pneumonia. Possible small left effusion. Electronically Signed   By: Kristine Garbe M.D.   On: 09/17/2017 05:19   Dg Chest Port 1 View  Result Date: 09/15/2017 CLINICAL DATA:  Shortness of breath EXAM: PORTABLE CHEST 1 VIEW COMPARISON:  08/31/2016 FINDINGS: Mild cardiomegaly. Status post CABG. There are increased markings at the bases that is likely primarily from atelectasis and interstitial crowding. Lung volumes are low. No Kerley lines or effusion. No air bronchogram. IMPRESSION: 1. Increased markings at the bases favoring atelectasis in the setting of low lung volumes. No air bronchogram or pulmonary edema. 2. Chronic cardiomegaly. Electronically Signed   By: Monte Fantasia M.D.   On: 09/15/2017 09:43    Patient Profile     62 y.o. male with history of CAD s/p CABG, ischemic cardiomyopathy, CVA/carotid disease, PAD, COPD, PAF was admitted with influenza and noted to have afib/RVR interspersed with long pauses.   Assessment & Plan    1. ID: Influenza A.  Treating with oseltamivir per primary service.  Afebrile.  CXR showed possible left base PNA => further antimicrobials per primary team.  2. Tachy-brady syndrome: Patient alternates afib/RVR with NSR with episodes of long sinus pauses.  Had 10 second pause 2/8 with TTVP placed.  Currently maintaining NSR in 70s.  - He has not been anticoagulated prior to admission due to GI bleeding from AVMs. However, I would recommend apixaban after PPM placed.  - With tachy-brady syndrome, I think he will need PPM placed  when more stable from a respiratory standpoint.  Leave TTVP in for now.  TTVP working properly.  - Continue amiodarone gtt + Toprol XL 25 daily.  3. Acute on chronic systolic CHF: Ischemic cardiomyopathy.  Last echo in 2017 with EF 45-50%.  Possible volume overload on exam with increased oxygen requirement. - Needs echo this admission, ordered.  - Lasix 40 mg IV bid x 2 doses, follow I/Os.  4. CAD: s/p CABG.  He is on ASA 81 and statin.  No chest pain.  5. CVA: history of CVA.  He has carotid disease as well as PAF.  He was not anticoagulated prior to admission due to GI bleeding but this was years ago on Pradaxa.  Currently on ASA 81.  - Will start heparin gtt pre-PPM, plan Eliquis after PPM.  - Stop ASA.  6. CKD: Stage 3. Baseline creatinine appears to be about 1.5.    For questions or updates, please contact [place] Please consult [email or web address] for contact info under Cardiology/STEMI.      Signed, Loralie Champagne, MD  09/17/2017, 8:03 AM

## 2017-09-17 NOTE — Progress Notes (Signed)
Medicine attending: I examined this patient today together with resident physician Dr. Lorella Nimrod and I concur with her evaluation and management plan which we discussed together. Transient fall in oxygenation during the night down to 84%.  Ventimask placed with FiO2 55 with rapid rise in O2 sat to 95%.  Following oxygenation also correlated with episodic rapid atrial fibrillation with associated fall in systolic pressure and he was started on amiodarone infusion to control rate. Repeat chest x-ray which I personally reviewed does not show any signs of a pneumonia.  He likely has some left base atelectasis. He is being treated for influenza A at present which explains his respiratory symptoms.  In addition, he has been in bed and is hypoventilating.  Poor cooperation with exam today.  Poor inspiratory effort.  Lungs overall clear and resonant to percussion throughout.  Heart rate irregularly irregular rate now less than 100. Impression: 1.  Tachybradycardia syndrome requiring placement of temporary pacemaker.  Permanent pacemaker placement planned for tomorrow. 2.  Paroxysmal atrial fibrillation at rapid rate, poorly tolerated with fall in blood pressure.  Now on amiodarone. 3.  Transient hypoxia.  Multifactorial.  See discussion above.  No clinical evidence that he has a superimposed pneumonia above and beyond respiratory symptoms that he has from influenza A.  We will not add antibacterial coverage at this time.  Patient needs to be mobilized out of bed.  Encouraged to use incentive spirometry. 4.  Cardiology agrees with me that he should be put back on anticoagulation with apixaban when otherwise stable and not having surgical procedures.

## 2017-09-17 NOTE — Progress Notes (Signed)
   Subjective: Patient was on Ventimask when seen this morning.  He was feeling little lethargic.  Later yesterday he developed multiple episodes of A. fib with RVR and started on amiodarone gtt. last night he started desaturating and low 80s and requiring Ventimask with FiO2 of 55% currently.  He continued to experience dyspnea.  Objective:  Vital signs in last 24 hours: Vitals:   09/17/17 0733 09/17/17 0745 09/17/17 0800 09/17/17 0900  BP:   (!) 123/92 131/61  Pulse:   71 71  Resp:   (!) 28 (!) 29  Temp:  98.7 F (37.1 C)    TempSrc:  Oral    SpO2: 92%  97% 99%  Weight:    171 lb 1.2 oz (77.6 kg)  Height:       General.  Well-developed, obese gentleman, looks comfortable with Ventimask, in no acute distress. Lungs.  Bilateral basal crackles. CV.  Regular rate and rhythm. Abdomen.  Soft, obese, nondistended, bowel sounds positive. Extremities.  Right forefoot amputation, bilateral lower extremities with excoriation marks, no edema.  Assessment/Plan:  62 yo M with History of Paroxysmal A. Fib, CHF (Last Echo 11/16/15 EF40-50%, CAD (s/p CABG 2017), Carotid stenosis (S/P L Carotid Endarterectomy), CVA, COPD, HTN, Abdominal Hernia, and Diabetes presented via [facility] with Generalized weakness and was found to be in A. Fib with RVR and having intermittent 6-10 second sinus pauses.  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 fib with RVR later yesterday-started on amiodarone gtt, currently in sinus rhythm.  Troponins started trending down at 0.16 this morning.  He was started on heparin by cardiology with anticipation of permanent pacemaker placement, after the procedure he will be discharged on Eliquis.  Aspirin was discontinued. -Continue amiodarone gtt cardiology. -Echo was ordered still pending. -Continue metoprolol 25 mg daily.  Worsening hypoxia.  Most likely due to poor  patient effort.  Repeat chest x-ray with possible worsening of left lower lobe atelectasis/pneumonia??  On personal review I think it is atelectasis as there are no other sign of pneumonia, the patient remained afebrile with no leukocytosis.  Dr. Aundra Dubin was concerned about possible pneumonia and starting antibiotic.  We will check pro-calcitonin, currently no evidence of any superimposed bacterial infection. -Chest PT Q4H -Flutter valve and incentive spirometry. -Encourage patient to be mobilized. -Try weaning him off from Ventimask. -A.m. CBC  Hypokalemia.  Potassium at 3.3 today. -P.o. KCl 20 mEq for 2 doses. -Repeat BMP in the morning.  AKI.  Creatinine appears at baseline, 1.68 today.  Lasix 40 mg IV twice daily restarted by cardiology today. -Keep monitoring BMP.  Hypotension, History of HTN: Currently normotensive. -Continue Fludrocortisone 0.1mg  Daily -Continue metoprolol 25mg  Daily  Influenza A: On Tamiflu. -Continue supportive care.  HFrEF: 02/15/16 EF45%-50%.Diffuse hypokinesis.  Echo was ordered yesterday-still pending. -Continue metoprolol. -Lasix 40 mg IV twice daily restarted today by cardiology.  DM: Stable.   CBG in 120s. -Continue Lantus 10 units at bedtime. -Continue SSI.  CAD: S/P CABGx31/2017.  - Continue Atorvastatin -ASA discontinued by cardiology. - Continue metoprolol as above - Nitroglycerin PRN  COPD: - Continue Duoneb BID PRN  Anxiety, Sciatica,?Chronic Pain. - Continue HomeClonazepam 0.5 qhs, Gabapentin 600mg  BID, Norco 5-325,Paroxetine 40mg  Daily  Dispo: Anticipated discharge in approximately 2-3 day(s).  Pending pacemaker placement.  Lorella Nimrod, MD 09/17/2017, 9:47 AM Pager: [phone]

## 2017-09-17 NOTE — Progress Notes (Signed)
O2 sats dropping into low 80's while patient is sleeping and on 6L [place]. 60% venti mask applied. Sats increase to 95-96% after having patient wake up and take deep breaths and cough. Dr. Juleen China with IM paged.

## 2017-09-17 NOTE — Progress Notes (Signed)
Attempted to place foley catheter but were unsuccessful. Patient is uncircumcised and RN unable to retract foreskin to visualize meatus. Attempt made by two other RN's without success. Condom cath replaced. Will most likely need to consult urology if foley required.

## 2017-09-18 ENCOUNTER — Inpatient Hospital Stay (HOSPITAL_COMMUNITY): Payer: Medicare (Managed Care)

## 2017-09-18 ENCOUNTER — Encounter (HOSPITAL_COMMUNITY): Payer: Self-pay | Admitting: Interventional Cardiology

## 2017-09-18 ENCOUNTER — Other Ambulatory Visit: Payer: Self-pay

## 2017-09-18 DIAGNOSIS — S80811A Abrasion, right lower leg, initial encounter: Secondary | ICD-10-CM

## 2017-09-18 DIAGNOSIS — J690 Pneumonitis due to inhalation of food and vomit: Secondary | ICD-10-CM | POA: Diagnosis present

## 2017-09-18 DIAGNOSIS — I495 Sick sinus syndrome: Principal | ICD-10-CM

## 2017-09-18 DIAGNOSIS — J9600 Acute respiratory failure, unspecified whether with hypoxia or hypercapnia: Secondary | ICD-10-CM

## 2017-09-18 DIAGNOSIS — Z880 Allergy status to penicillin: Secondary | ICD-10-CM

## 2017-09-18 DIAGNOSIS — J111 Influenza due to unidentified influenza virus with other respiratory manifestations: Secondary | ICD-10-CM

## 2017-09-18 DIAGNOSIS — X58XXXA Exposure to other specified factors, initial encounter: Secondary | ICD-10-CM

## 2017-09-18 DIAGNOSIS — Z79899 Other long term (current) drug therapy: Secondary | ICD-10-CM

## 2017-09-18 DIAGNOSIS — Z951 Presence of aortocoronary bypass graft: Secondary | ICD-10-CM

## 2017-09-18 DIAGNOSIS — J1008 Influenza due to other identified influenza virus with other specified pneumonia: Secondary | ICD-10-CM

## 2017-09-18 DIAGNOSIS — I5042 Chronic combined systolic (congestive) and diastolic (congestive) heart failure: Secondary | ICD-10-CM

## 2017-09-18 DIAGNOSIS — Z89411 Acquired absence of right great toe: Secondary | ICD-10-CM

## 2017-09-18 DIAGNOSIS — I509 Heart failure, unspecified: Secondary | ICD-10-CM

## 2017-09-18 DIAGNOSIS — S80812A Abrasion, left lower leg, initial encounter: Secondary | ICD-10-CM

## 2017-09-18 DIAGNOSIS — Z89421 Acquired absence of other right toe(s): Secondary | ICD-10-CM

## 2017-09-18 DIAGNOSIS — E119 Type 2 diabetes mellitus without complications: Secondary | ICD-10-CM

## 2017-09-18 DIAGNOSIS — I480 Paroxysmal atrial fibrillation: Secondary | ICD-10-CM

## 2017-09-18 DIAGNOSIS — I4510 Unspecified right bundle-branch block: Secondary | ICD-10-CM

## 2017-09-18 DIAGNOSIS — I110 Hypertensive heart disease with heart failure: Secondary | ICD-10-CM

## 2017-09-18 DIAGNOSIS — K469 Unspecified abdominal hernia without obstruction or gangrene: Secondary | ICD-10-CM

## 2017-09-18 DIAGNOSIS — I6522 Occlusion and stenosis of left carotid artery: Secondary | ICD-10-CM

## 2017-09-18 DIAGNOSIS — I5043 Acute on chronic combined systolic (congestive) and diastolic (congestive) heart failure: Secondary | ICD-10-CM

## 2017-09-18 DIAGNOSIS — Z79891 Long term (current) use of opiate analgesic: Secondary | ICD-10-CM

## 2017-09-18 DIAGNOSIS — E876 Hypokalemia: Secondary | ICD-10-CM

## 2017-09-18 DIAGNOSIS — I2510 Atherosclerotic heart disease of native coronary artery without angina pectoris: Secondary | ICD-10-CM

## 2017-09-18 DIAGNOSIS — I10 Essential (primary) hypertension: Secondary | ICD-10-CM

## 2017-09-18 DIAGNOSIS — E874 Mixed disorder of acid-base balance: Secondary | ICD-10-CM

## 2017-09-18 DIAGNOSIS — Z8673 Personal history of transient ischemic attack (TIA), and cerebral infarction without residual deficits: Secondary | ICD-10-CM

## 2017-09-18 LAB — GLUCOSE, CAPILLARY
GLUCOSE-CAPILLARY: 100 mg/dL — AB (ref 65–99)
GLUCOSE-CAPILLARY: 125 mg/dL — AB (ref 65–99)
Glucose-Capillary: 130 mg/dL — ABNORMAL HIGH (ref 65–99)
Glucose-Capillary: 129 mg/dL — ABNORMAL HIGH (ref 65–99)
Glucose-Capillary: 143 mg/dL — ABNORMAL HIGH (ref 65–99)
Glucose-Capillary: 130 mg/dL — ABNORMAL HIGH (ref 65–99)

## 2017-09-18 LAB — BLOOD GAS, ARTERIAL
Acid-Base Excess: 3.1 mmol/L — ABNORMAL HIGH (ref 0.0–2.0)
Bicarbonate: 26.7 mmol/L (ref 20.0–28.0)
Drawn by: 313941
FIO2: 1
O2 Saturation: 98.2 %
Patient temperature: 99.8
pCO2 arterial: 38.7 mmHg (ref 32.0–48.0)
pH, Arterial: 7.456 — ABNORMAL HIGH (ref 7.350–7.450)
pO2, Arterial: 118 mmHg — ABNORMAL HIGH (ref 83.0–108.0)

## 2017-09-18 LAB — CBC
HEMATOCRIT: 30.2 % — AB (ref 39.0–52.0)
Hemoglobin: 9.3 g/dL — ABNORMAL LOW (ref 13.0–17.0)
MCH: 26.3 pg (ref 26.0–34.0)
MCHC: 30.8 g/dL (ref 30.0–36.0)
MCV: 85.3 fL (ref 78.0–100.0)
PLATELETS: 259 10*3/uL (ref 150–400)
RBC: 3.54 MIL/uL — AB (ref 4.22–5.81)
RDW: 14.9 % (ref 11.5–15.5)
WBC: 14.9 10*3/uL — ABNORMAL HIGH (ref 4.0–10.5)

## 2017-09-18 LAB — BASIC METABOLIC PANEL
Anion gap: 16 — ABNORMAL HIGH (ref 5–15)
BUN: 57 mg/dL — AB (ref 6–20)
CHLORIDE: 94 mmol/L — AB (ref 101–111)
CO2: 26 mmol/L (ref 22–32)
CREATININE: 2.54 mg/dL — AB (ref 0.61–1.24)
Calcium: 8.5 mg/dL — ABNORMAL LOW (ref 8.9–10.3)
GFR, EST AFRICAN AMERICAN: 29 mL/min — AB (ref >60)
GFR, EST NON AFRICAN AMERICAN: 25 mL/min — AB (ref >60)
Glucose, Bld: 137 mg/dL — ABNORMAL HIGH (ref 65–99)
POTASSIUM: 3.6 mmol/L (ref 3.5–5.1)
SODIUM: 136 mmol/L (ref 135–145)

## 2017-09-18 LAB — LACTIC ACID, PLASMA
LACTIC ACID, VENOUS: 1.2 mmol/L (ref 0.5–1.9)
LACTIC ACID, VENOUS: 1.3 mmol/L (ref 0.5–1.9)

## 2017-09-18 LAB — PROCALCITONIN: PROCALCITONIN: 1.54 ng/mL

## 2017-09-18 LAB — ECHOCARDIOGRAM COMPLETE
HEIGHTINCHES: 60 in
Weight: 2768.98 oz

## 2017-09-18 LAB — HEPARIN LEVEL (UNFRACTIONATED): HEPARIN UNFRACTIONATED: 0.36 [IU]/mL (ref 0.30–0.70)

## 2017-09-18 LAB — CORTISOL: Cortisol, Plasma: 18.1 ug/dL

## 2017-09-18 MED ORDER — OSELTAMIVIR PHOSPHATE 30 MG PO CAPS
30.0000 mg | ORAL_CAPSULE | Freq: Every day | ORAL | Status: AC
  Administered 2017-09-19: 30 mg via ORAL
  Filled 2017-09-18: qty 1

## 2017-09-18 MED ORDER — PERFLUTREN LIPID MICROSPHERE
1.0000 mL | INTRAVENOUS | Status: AC | PRN
  Administered 2017-09-18: 3 mL via INTRAVENOUS
  Filled 2017-09-18: qty 10

## 2017-09-18 MED ORDER — SODIUM CHLORIDE 0.9 % IV BOLUS (SEPSIS)
250.0000 mL | Freq: Once | INTRAVENOUS | Status: AC
  Administered 2017-09-18: 250 mL via INTRAVENOUS

## 2017-09-18 MED ORDER — SODIUM CHLORIDE 0.9 % IV SOLN
250.0000 mL | INTRAVENOUS | Status: DC | PRN
  Administered 2017-09-27: 250 mL via INTRAVENOUS

## 2017-09-18 MED ORDER — AMIODARONE HCL IN DEXTROSE 360-4.14 MG/200ML-% IV SOLN
30.0000 mg/h | INTRAVENOUS | Status: DC
  Administered 2017-09-18 – 2017-09-23 (×9): 30 mg/h via INTRAVENOUS
  Filled 2017-09-18 (×15): qty 200

## 2017-09-18 MED ORDER — AMIODARONE LOAD VIA INFUSION
150.0000 mg | Freq: Once | INTRAVENOUS | Status: AC
  Administered 2017-09-18: 150 mg via INTRAVENOUS
  Filled 2017-09-18: qty 83.34

## 2017-09-18 MED ORDER — DEXTROSE 5 % IV SOLN: 1.0000 g | INTRAVENOUS | Status: DC

## 2017-09-18 MED ORDER — NOREPINEPHRINE BITARTRATE 1 MG/ML IV SOLN
0.0000 ug/min | INTRAVENOUS | Status: DC
  Administered 2017-09-18: 2 ug/min via INTRAVENOUS
  Administered 2017-09-19: 20 ug/min via INTRAVENOUS
  Administered 2017-09-19: 2 ug/min via INTRAVENOUS
  Administered 2017-09-20: 18 ug/min via INTRAVENOUS
  Administered 2017-09-20: 20 ug/min via INTRAVENOUS
  Administered 2017-09-20: 15 ug/min via INTRAVENOUS
  Administered 2017-09-20: 14 ug/min via INTRAVENOUS
  Filled 2017-09-18 (×9): qty 4

## 2017-09-18 MED ORDER — CLINDAMYCIN PHOSPHATE 600 MG/50ML IV SOLN
600.0000 mg | Freq: Three times a day (TID) | INTRAVENOUS | Status: DC
  Administered 2017-09-18 – 2017-09-19 (×4): 600 mg via INTRAVENOUS
  Filled 2017-09-18 (×4): qty 50

## 2017-09-18 MED ORDER — SODIUM CHLORIDE 0.9 % IV SOLN
1500.0000 mg | Freq: Once | INTRAVENOUS | Status: DC
  Filled 2017-09-18: qty 1500

## 2017-09-18 MED ORDER — PERFLUTREN LIPID MICROSPHERE
INTRAVENOUS | Status: AC
  Administered 2017-09-18: 3 mL via INTRAVENOUS
  Filled 2017-09-18: qty 10

## 2017-09-18 MED ORDER — VANCOMYCIN HCL IN DEXTROSE 750-5 MG/150ML-% IV SOLN: 750.0000 mg | INTRAVENOUS | Status: DC

## 2017-09-18 MED ORDER — DEXTROSE 5 % IV SOLN
1.0000 g | Freq: Once | INTRAVENOUS | Status: DC
  Filled 2017-09-18: qty 1

## 2017-09-18 MED ORDER — AMIODARONE HCL IN DEXTROSE 360-4.14 MG/200ML-% IV SOLN: 30.0000 mg/h | INTRAVENOUS | Status: DC

## 2017-09-18 MED ORDER — AMIODARONE HCL IN DEXTROSE 360-4.14 MG/200ML-% IV SOLN: 60.0000 mg/h | INTRAVENOUS | Status: DC

## 2017-09-18 MED ORDER — SENNOSIDES-DOCUSATE SODIUM 8.6-50 MG PO TABS
1.0000 | ORAL_TABLET | Freq: Two times a day (BID) | ORAL | Status: DC | PRN
  Administered 2017-09-23 – 2017-09-26 (×2): 1 via ORAL
  Filled 2017-09-18 (×2): qty 1

## 2017-09-18 MED ORDER — SODIUM CHLORIDE 0.9 % IV SOLN
1.0000 g | Freq: Three times a day (TID) | INTRAVENOUS | Status: DC
  Administered 2017-09-18 – 2017-09-19 (×3): 1 g via INTRAVENOUS
  Filled 2017-09-18 (×4): qty 1

## 2017-09-18 MED FILL — Lidocaine HCl Local Inj 1%: INTRAMUSCULAR | Qty: 20 | Status: AC

## 2017-09-18 NOTE — Progress Notes (Signed)
Progress Note  Patient Name: [name] Date of Encounter: 09/18/2017  Primary Cardiologist: No primary care provider on file.   Subjective   Feeling short of breath.  No palpitations.  Inpatient Medications    Scheduled Meds: . atorvastatin  80 mg Oral Daily  . clonazePAM  0.5 mg Oral QHS  . fludrocortisone  0.1 mg Oral Daily  . gabapentin  600 mg Oral BID  . Influenza vac split quadrivalent PF  0.5 mL Intramuscular Tomorrow-1000  . insulin aspart  0-9 Units Subcutaneous TID WC  . insulin glargine  10 Units Subcutaneous QHS  . ipratropium-albuterol  3 mL Nebulization BID  . mouth rinse  15 mL Mouth Rinse BID  . metoprolol tartrate  25 mg Oral Q8H  . oseltamivir  30 mg Oral BID  . pantoprazole  40 mg Oral Daily  . PARoxetine  40 mg Oral Daily  . sodium chloride flush  3 mL Intravenous Q12H   Continuous Infusions: . sodium chloride    . amiodarone 30 mg/hr (09/18/17 0828)  . clindamycin (CLEOCIN) IV    . heparin 1,050 Units/hr (09/18/17 0832)   PRN Meds: sodium chloride, acetaminophen, camphor-menthol, guaiFENesin-dextromethorphan, HYDROcodone-acetaminophen, nitroGLYCERIN, ondansetron (ZOFRAN) IV, polyvinyl alcohol, sodium chloride flush   Vital Signs    Vitals:   09/18/17 0458 09/18/17 0500 09/18/17 0600 09/18/17 0700  BP:  95/68 (!) 95/58 98/64  Pulse:  (!) 49  99  Resp:  (!) 29 (!) 41 (!) 41  Temp:      TempSrc:      SpO2:  100%  98%  Weight: 173 lb 1 oz (78.5 kg)     Height:        Intake/Output Summary (Last 24 hours) at 09/18/2017 0833 Last data filed at 09/18/2017 0700 Gross per 24 hour  Intake 1459.1 ml  Output 270 ml  Net 1189.1 ml   Filed Weights   09/16/17 0700 09/17/17 0900 09/18/17 0458  Weight: 171 lb 4.8 oz (77.7 kg) 171 lb 1.2 oz (77.6 kg) 173 lb 1 oz (78.5 kg)    Telemetry    Atrial fibrillation.  Rates 100s-150s.  Occasional ventricular pacing. - Personally Reviewed  ECG    09/17/17: Atrial fibrillation.  Rate 135 bpm.   Right bundle branch block. - Personally Reviewed  Physical Exam   VS:  BP 98/64   Pulse 99   Temp 99.8 F (37.7 C) (Axillary)   Resp (!) 41   Ht 5' (1.524 m)   Wt 173 lb 1 oz (78.5 kg)   SpO2 99%   BMI 33.80 kg/m  , BMI Body mass index is 33.8 kg/m. GENERAL:  Ill-appearing.  Ttachypneic and dyspneic.  Lethargic. HEENT: Pupils equal round and reactive, fundi not visualized, oral mucosa unremarkable NECK:  No jugular venous distention, waveform within normal limits, carotid upstroke brisk and symmetric, no bruits LUNGS:  Clear to auscultation bilaterally HEART:  Tachycardic.  Irregularly irregularPMI not displaced or sustained,S1 and S2 within normal limits, no S3, no S4, no clicks, no rubs, no murmurs ABD:  Flat, positive bowel sounds normal in frequency in pitch, no bruits, no rebound, no guarding, no midline pulsatile mass, no hepatomegaly, no splenomegaly EXT:  2 plus pulses throughout, no edema, no cyanosis no clubbing SKIN:  No rashes no nodules NEURO:  Cranial nerves II through XII grossly intact, motor grossly intact throughout PSYCH:  Cognitively intact, oriented to person place and time   [employer]  09/16/17 0200 09/17/17 0436 09/18/17 0446  NA 135 139 136  K 3.6 3.3* 3.6  CL 93* 96* 94*  CO2 25 28 26   GLUCOSE 81 143* 137*  BUN 56* 53* 57*  CREATININE 1.59* 1.68* 2.54*  CALCIUM 8.9 9.0 8.5*  GFRNONAA 45* 42* 25*  GFRAA 52* 48* 29*  ANIONGAP 17* 15 16*     Hematology Recent Labs  Lab 09/15/17 0910 09/15/17 0938 09/16/17 0200 09/18/17 0446  WBC 8.0  --  5.4 14.9*  RBC 3.87*  --  3.67* 3.54*  HGB 10.4* 10.9* 9.7* 9.3*  HCT 32.7* 32.0* 31.2* 30.2*  MCV 84.5  --  85.0 85.3  MCH 26.9  --  26.4 26.3  MCHC 31.8  --  31.1 30.8  RDW 14.3  --  14.6 14.9  PLT 281  --  270 259    Cardiac Enzymes Recent Labs  Lab 09/15/17 2103 09/16/17 1159 09/16/17 1754 09/17/17 0436  TROPONINI 0.12* 0.14* 0.17* 0.16*    Recent Labs  Lab  09/15/17 0935  TROPIPOC 0.03     BNPNo results for input(s): BNP, PROBNP in the last 168 hours.   DDimer No results for input(s): DDIMER in the last 168 hours.   Radiology    Dg Chest Port 1 View  Result Date: 09/18/2017 CLINICAL DATA:  Acute onset of tachypnea and dyspnea. Increased oxygen requirement. EXAM: PORTABLE CHEST 1 VIEW COMPARISON:  Chest radiograph performed 09/17/2016 FINDINGS: Right basilar and left perihilar airspace opacification may reflect pneumonia or pulmonary edema. No pleural effusion or pneumothorax is seen. The cardiomediastinal silhouette is borderline normal in size. The patient is status post median sternotomy. No acute osseous abnormalities are identified. IMPRESSION: Right basilar and left perihilar airspace opacification may reflect pneumonia or pulmonary edema. Electronically Signed   By: Garald Balding M.D.   On: 09/18/2017 03:12   Dg Chest Port 1 View  Result Date: 09/17/2017 CLINICAL DATA:  62 y/o  M; dyspnea. EXAM: PORTABLE CHEST 1 VIEW COMPARISON:  09/15/2017 chest radiograph FINDINGS: Stable cardiomegaly given projection and technique. Post median sternotomy with wires in alignment. Aortic atherosclerosis with calcification. Increased left lung base opacity. Possible small left effusion. No pneumothorax. No acute osseous abnormality is evident. IMPRESSION: Increased left lung base opacity may represent atelectasis or pneumonia. Possible small left effusion. Electronically Signed   By: Kristine Garbe M.D.   On: 09/17/2017 05:19    Cardiac Studies   Echo pending  Patient Profile     62 y.o. male with CAD status post CABG, chronic systolic and diastolic heart failure, prior stroke, carotid artery disease, peripheral arterial disease, COPD, and paroxysmal atrial fibrillation here with influenza and atrial fibrillation with rapid ventricular response.  Possible that she has been comp gated by tacky/bradycardia syndrome.  Assessment & Plan    #  SSS: # Paroxysmal atrial fibrillation:  Mr. Ashmead remains in atrial fibrillation this morning.  His heart rates are poorly-controlled.  Amiodarone was increased this AM per EP .   He is currently on heparin and Eliquis is on hold and plans for pacemaker implantation this admission once his is more clinically stable.  Echocardiogram is pending.  His blood pressure is too low to titrate metoprolol.  # CAD s/p CABG:  # Demand ischemia: # Hyperlipidemia: Troponin is mildly elevated.  This is likely due to demand ischemia in the setting of influenza and atrial fibrillation with rapid ventricular response.  He has not experienced any chest pain.  He is currently on  heparin for atrial fibrillation.  Continue metoprolol and atorvastatin.  He has not on aspirin given that he is on life anti-coagulation.  # Chronic systolic and diastolic heart failure:  Volume status stable.  Continue metoprolol.   # PAD: Continue heparin and atorvastatin.  # Influenza A: Continue oseltamivir.   # Acute renal failure: Baseline creatinine was 0.8.  It is up to 2.5 today.  He received 2 doses of IV Lasix yesterday.  He appears to be euvolemic, if not dry.  I suspect that this is multifactorial due to hypotension and intravascular volume depletion.  No Lasix today.   For questions or updates, please contact [place] Please consult [email or web address] for contact info under Cardiology/STEMI.      Signed, Skeet Latch, MD  09/18/2017, 8:33 AM

## 2017-09-18 NOTE — Progress Notes (Signed)
Patient placed on NRB for O2 sats 82% on 55% venti mask. O2 sats increased to 94-96%. Will continue to monitor.

## 2017-09-18 NOTE — Progress Notes (Signed)
Patient evaluated at bedside. He continues to be drowsy, but seem slightly more alert than this morning. He is saturating 92% on high flow nasal cannula. He denies pain or shortness of breath. No documented bowl movement since 2/6 per nursing. Patient's BP a little softer than this morning. On exam, patient slightly more tachycardic, otherwise unchanged from this morning. Pulmonary effort remains poor so this is difficult to assess. Vitals:   09/18/17 1140 09/18/17 1242  BP:    Pulse:  (!) 116  Resp:  (!) 33  Temp: (!) 101.6 F (38.7 C)   SpO2:  96%   - 500cc NS Bolus to support blood pressure in the setting of recent IV diuresis and appearing euvolemic to dry on exam - Senna-docusate for constipation

## 2017-09-18 NOTE — Progress Notes (Signed)
  Echocardiogram 2D Echocardiogram has been performed.  [name] 09/18/2017, 3:00 PM

## 2017-09-18 NOTE — Progress Notes (Signed)
Pharmacy Antibiotic Note  [name] is a 62 y.o. male admitted on 09/15/2017 with tachy-brady syndrome, now w/ concern for aspiration PNA on clindamycin and adding azactam -WBC= 14.9, tmax= 103, SCr= 2.54 (up)  Plan: -Azactam 1gm IV q8h -Will follow renal function, cultures and clinical progress    Height: 5' (152.4 cm) Weight: 173 lb 1 oz (78.5 kg) IBW/kg (Calculated) : 50  Temp (24hrs), Avg:99.8 F (37.7 C), Min:98.1 F (36.7 C), Max:103 F (39.4 C)  Recent Labs  Lab 09/15/17 0910 09/15/17 0938 09/16/17 0200 09/17/17 0436 09/18/17 0446  WBC 8.0  --  5.4  --  14.9*  CREATININE 1.69* 1.70* 1.59* 1.68* 2.54*    Estimated Creatinine Clearance: 25.9 mL/min (A) (by C-G formula based on SCr of 2.54 mg/dL (H)).    Allergies  Allergen Reactions  . Penicillins Hives, Nausea And Vomiting, Swelling and Other (See Comments)    Has patient had a PCN reaction causing immediate rash, facial/tongue/throat swelling, SOB or lightheadedness with hypotension: YES Has patient had a PCN reaction causing severe rash involving mucus membranes or skin necrosis: No Has patient had a PCN reaction that required hospitalization No Has patient had a PCN reaction occurring within the last 10 years: No If all of the above answers are "NO", then may proceed with Cephalosporin use.      Thank you for allowing pharmacy to be a part of this patient's care.  Hildred Laser, Pharm D 09/18/2017 10:22 AM

## 2017-09-18 NOTE — Plan of Care (Signed)
Patient is currently progressing with plan of care.

## 2017-09-18 NOTE — Progress Notes (Signed)
Paged by nurse due to concern for tachypnea of ~45-50, SpO2 saturation as low as 80% on 15L of O2 per high-flow nasal cannula. Found patient to be saturating near 90% but fluctuating greatly as informed. In addition he was breathing at a rate of ~40 with shallow breaths. There was minimal use of accessory muscles of respiration but given his age and comorbidities, this rate is concerning.   Ordered Stat CXR.-I personally reviewed the image with notable increased opacity of the RLL above the diaphragm as well as increased infiltrate in the left lobe. This is concerning for pulmonary edema vs pneumonia when also considering his increased O2 demands.   Requested that we switch off the high flow nasal cannula to a face mask due to nasal congestion. Patient is unable to breath through his nose. I do not believe the oxygen was reaching his lungs appropriately or consistently which also explains the great degree of variability on the monitor.   Kathi Ludwig, MD  Addendum: Nurse placed the venti mask, but this did not improve his symptoms. She next placed the NRB which greatly improved his SpO2 to 94-96% with a corresponding decrease in his RR.

## 2017-09-18 NOTE — Consult Note (Signed)
Electrophysiology Rounding Note  Patient Name: [name] Date of Encounter: 09/18/2017  Primary Cardiologist: [facility] Electrophysiologist: Lovena Le (new this admission)   Subjective   The patient is ill today.  Increasing O2 demands, increased WOB, back in AF with RVR and intermittent pauses  Inpatient Medications    Scheduled Meds: . atorvastatin  80 mg Oral Daily  . clonazePAM  0.5 mg Oral QHS  . fludrocortisone  0.1 mg Oral Daily  . gabapentin  600 mg Oral BID  . Influenza vac split quadrivalent PF  0.5 mL Intramuscular Tomorrow-1000  . insulin aspart  0-9 Units Subcutaneous TID WC  . insulin glargine  10 Units Subcutaneous QHS  . ipratropium-albuterol  3 mL Nebulization BID  . mouth rinse  15 mL Mouth Rinse BID  . metoprolol tartrate  25 mg Oral Q8H  . oseltamivir  30 mg Oral BID  . pantoprazole  40 mg Oral Daily  . PARoxetine  40 mg Oral Daily  . sodium chloride flush  3 mL Intravenous Q12H   Continuous Infusions: . sodium chloride    . amiodarone 30 mg/hr (09/18/17 0249)  . clindamycin (CLEOCIN) IV    . heparin 1,050 Units/hr (09/17/17 2100)   PRN Meds: sodium chloride, acetaminophen, camphor-menthol, guaiFENesin-dextromethorphan, HYDROcodone-acetaminophen, nitroGLYCERIN, ondansetron (ZOFRAN) IV, polyvinyl alcohol, sodium chloride flush   Vital Signs    Vitals:   09/18/17 0458 09/18/17 0500 09/18/17 0600 09/18/17 0700  BP:  95/68 (!) 95/58 98/64  Pulse:  (!) 49  99  Resp:  (!) 29 (!) 41 (!) 41  Temp:      TempSrc:      SpO2:  100%  98%  Weight: 173 lb 1 oz (78.5 kg)     Height:        Intake/Output Summary (Last 24 hours) at 09/18/2017 0820 Last data filed at 09/18/2017 0700 Gross per 24 hour  Intake 1459.1 ml  Output 270 ml  Net 1189.1 ml   Filed Weights   09/16/17 0700 09/17/17 0900 09/18/17 0458  Weight: 171 lb 4.8 oz (77.7 kg) 171 lb 1.2 oz (77.6 kg) 173 lb 1 oz (78.5 kg)    Physical Exam    GEN- The patient is acutely ill appearing,  alert and oriented x 3 today.   Head- normocephalic, atraumatic Eyes-  Sclera clear, conjunctiva pink Ears- hearing intact Oropharynx- clear Neck- supple Lungs- Increased work of breathing Heart- Tachycardic irregular rate and rhythm  GI- soft, NT, ND, + BS Extremities- no clubbing, cyanosis, or edema Skin- no rash or lesion Psych- euthymic mood, full affect Neuro- strength and sensation are intact  Labs    CBC Recent Labs    09/16/17 0200 09/18/17 0446  WBC 5.4 14.9*  HGB 9.7* 9.3*  HCT 31.2* 30.2*  MCV 85.0 85.3  PLT 270 027   Basic Metabolic Panel Recent Labs    09/15/17 1639  09/17/17 0436 09/18/17 0446  NA  --    < > 139 136  K  --    < > 3.3* 3.6  CL  --    < > 96* 94*  CO2  --    < > 28 26  GLUCOSE  --    < > 143* 137*  BUN  --    < > 53* 57*  CREATININE  --    < > 1.68* 2.54*  CALCIUM  --    < > 9.0 8.5*  MG 1.7  --   --   --    < > =  values in this interval not displayed.   Cardiac Enzymes Recent Labs    09/16/17 1159 09/16/17 1754 09/17/17 0436  TROPONINI 0.14* 0.17* 0.16*     Telemetry    AF with RVR, intermittent pauses with V pacing (personally reviewed)  Radiology    Dg Chest Port 1 View  Result Date: 09/18/2017 CLINICAL DATA:  Acute onset of tachypnea and dyspnea. Increased oxygen requirement. EXAM: PORTABLE CHEST 1 VIEW COMPARISON:  Chest radiograph performed 09/17/2016 FINDINGS: Right basilar and left perihilar airspace opacification may reflect pneumonia or pulmonary edema. No pleural effusion or pneumothorax is seen. The cardiomediastinal silhouette is borderline normal in size. The patient is status post median sternotomy. No acute osseous abnormalities are identified. IMPRESSION: Right basilar and left perihilar airspace opacification may reflect pneumonia or pulmonary edema. Electronically Signed   By: Garald Balding M.D.   On: 09/18/2017 03:12   Dg Chest Port 1 View  Result Date: 09/17/2017 CLINICAL DATA:  62 y/o  M; dyspnea.  EXAM: PORTABLE CHEST 1 VIEW COMPARISON:  09/15/2017 chest radiograph FINDINGS: Stable cardiomegaly given projection and technique. Post median sternotomy with wires in alignment. Aortic atherosclerosis with calcification. Increased left lung base opacity. Possible small left effusion. No pneumothorax. No acute osseous abnormality is evident. IMPRESSION: Increased left lung base opacity may represent atelectasis or pneumonia. Possible small left effusion. Electronically Signed   By: Kristine Garbe M.D.   On: 09/17/2017 05:19     Patient Profile     62 y.o. male with history of CAD s/p CABG, ischemic cardiomyopathy, CVA/carotid disease, PAD, COPD, PAF was admitted with influenza and noted to have afib/RVR interspersed with long pauses.   Assessment & Plan    1.  Atrial fibrillation with tachy/brady syndrome Back in AF this morning, will increase amio this morning to try to restore SR. Will need pacemaker, but currently too ill Temp wire in place, left groin Continue Heparin for now - transition to eliquis post pacemaker CHADS2VASC is at least 4  2.  Influenza A Per primary team Afebrile  3.  Respiratory distress/?PNA Per primary team ABG and blood cultures pending  Dr Lovena Le to see later today - EP will follow. Plan PPM eventually when other medical issues improved. Leave temp wire for now.   Signed, Chanetta Marshall, NP  09/18/2017, 8:20 AM   EP Attending  Patient seen and examined. Agree with the history, exam, assessment and plan as noted by Chanetta Marshall, NP. The patient has reverted back to atrial fib. He remains quite ill. He was sleeping when I saw him and he has rales/rhonchi on exam worrisome for the development of respiratory failure. His temporary PPM will remain for now. Hopefully he will respond to treatment. Currently too ill and frail for PPM insertion. We will follow with you.   Mikle Bosworth.D.

## 2017-09-18 NOTE — Consult Note (Addendum)
PULMONARY / CRITICAL CARE MEDICINE   Name: ELVIS LAUFER MRN: [id number] DOB: 10-Feb-1956    ADMISSION DATE:  09/15/2017 CONSULTATION DATE: 09/18/2017  REFERRING MD: Neva Seat  Reason for consult: hypotension and worsening aA gradient  HISTORY OF PRESENT ILLNESS:        This is a 62 year old diabetic with a history of coronary disease and congestive heart failure with a baseline ejection fraction of 40-50%.  He was admitted on 2/8 complaining of generalized weakness and was found to be in atrial fibrillation with a rapid ventricular response with intermittent 7-8-second pauses.  A temporary pacing wire was placed.  He was noted to have infiltrates or CHF on his chest x-ray.  PCR for influenza a was positive.  He was started on a combination of Tamiflu as well as clindamycin and aztreonam due to a penicillin allergy.  He was diuresed but diuretics are currently being held as his creatinine has risen from approximately 0.9-2.5.  I was called today for hypotension and increasing oxygen requirements.  On my arrival the patient's mental status is very poor.  He is extremely lethargic and not giving any significant history.  He has had a nonproductive cough.  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  PAST MEDICAL HISTORY :  He  has a past medical history of Abdominal hernia, Abscess, abdomen (12/31/2010), Anemia, Anxiety, AVM (arteriovenous malformation), Carotid artery disease (HCC), Chronic diastolic CHF (congestive heart failure) ([place]), Chronic low back pain, COPD (chronic obstructive pulmonary disease) ([place]), CVA (cerebral infarction) (09/2010), Diabetes mellitus, GERD (gastroesophageal reflux disease), History of nuclear stress test, echocardiogram, Hyperlipidemia, Hypertension, Intellectual disability, Itching, Lung nodule, Myocardial infarction ([place]) (2016 ?), Obesity, Oxygen dependent, PAF (paroxysmal atrial fibrillation)  ([place]) (06/2009), Pneumonia, Stroke ([place]), Tobacco user, Tubular adenoma of colon, and Wears glasses.  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  Allergies  Allergen Reactions  . Penicillins Hives, Nausea And Vomiting, Swelling and Other (See Comments)    Has patient had a PCN reaction causing immediate rash, facial/tongue/throat swelling, SOB or lightheadedness with hypotension: YES Has patient had a PCN reaction causing severe rash involving mucus membranes or skin necrosis: No Has patient had a PCN reaction that required hospitalization No Has patient had a PCN reaction occurring within the last 10 years: No If all of the above answers are "NO", then may proceed with Cephalosporin use.     No current facility-administered medications on file prior to encounter.    Current Outpatient Medications on File Prior to Encounter  Medication Sig  . acetaminophen (TYLENOL) 325 MG tablet Take 650 mg by mouth 3 (three) times daily as needed for mild pain.  Marland Kitchen amLODipine (NORVASC) 5 MG tablet Take 5 mg by mouth daily.  Marland Kitchen aspirin EC 81 MG tablet Take 81 mg by mouth daily.  . bisoprolol (ZEBETA) 5 MG tablet Take 5  mg by mouth daily.  . calcium carbonate (  TUMS - DOSED IN MG ELEMENTAL CALCIUM) 500 MG chewable tablet Chew 1 tablet by mouth 3 (three) times daily as needed for indigestion or heartburn.  . Cholecalciferol (VITAMIN D3) 1000 UNITS CAPS Take 1,000 Units by mouth daily.  . clonazePAM (KLONOPIN) 0.5 MG tablet Take 1 tablet (0.5 mg total) by mouth at bedtime.  . furosemide (LASIX) 80 MG tablet Take 1.5 tablets (120 mg total) by mouth 2 (two) times daily. (Patient taking differently: Take 80-120 mg by mouth See admin instructions. 120 mg by mouth in the morning and 80 mg at bedtime)  . gabapentin (NEURONTIN) 600 MG tablet Take 600 mg by mouth 2 (two) times daily.  Marland Kitchen HYDROcodone-acetaminophen (NORCO) 5-325 MG tablet Take 1 tablet by mouth daily.  . Insulin Glargine (LANTUS) 100 UNIT/ML Solostar Pen Inject 10 Units into the skin every evening.  Marland Kitchen ipratropium-albuterol (DUONEB) 0.5-2.5 (3) MG/3ML SOLN Take 3 mLs by nebulization 2 (two) times daily.  . metFORMIN (GLUCOPHAGE) 1000 MG tablet Take 1,000 mg by mouth 2 (two) times daily with a meal.  . metolazone (ZAROXOLYN) 2.5 MG tablet Take 2.5 mg by mouth every Monday, Wednesday, and Friday.  . nitroGLYCERIN (NITROSTAT) 0.4 MG SL tablet Place 0.4 mg under the tongue every 5 (five) minutes as needed for chest pain.  . OXYGEN Inhale 2 L into the lungs as needed (to supplement).  . pantoprazole (PROTONIX) 40 MG tablet Take 40 mg by mouth daily.  Marland Kitchen PARoxetine (PAXIL) 40 MG tablet Take 40 mg by mouth daily.   . polyvinyl alcohol (LIQUIFILM TEARS) 1.4 % ophthalmic solution Place 1 drop into both eyes 2 (two) times daily as needed for dry eyes. Natural Tears  . Rivaroxaban (XARELTO) 15 MG TABS tablet Take 15 mg by mouth every evening.  . rosuvastatin (CRESTOR) 40 MG tablet Take 40 mg by mouth at bedtime.  . Skin Protectants, Misc. (EUCERIN) cream Apply 1 application topically 2 (two) times daily. TO AFFECTED AREAS    FAMILY HISTORY:  His indicated that his  mother is deceased. He indicated that his father is deceased. He indicated that his sister is alive. He indicated that his brother is alive. He indicated that his maternal grandmother is deceased. He indicated that his maternal grandfather is deceased. He indicated that his paternal grandmother is deceased. He indicated that his paternal grandfather is deceased. He indicated that the status of his maternal uncle is unknown. He indicated that the status of his neg hx is unknown.   SOCIAL HISTORY: He  reports that he quit smoking about 6 years ago. His smoking use included cigarettes and pipe. He has a 42.00 pack-year smoking history. He quit smokeless tobacco use about 6 years ago. He reports that he does not drink alcohol or use drugs.  REVIEW OF SYSTEMS:   Unobtainable  SUBJECTIVE:  As above  VITAL SIGNS: BP (!) 79/58   Pulse (!) 164   Temp (!) 101.6 F (38.7 C) (Axillary)   Resp (!) 25   Ht 5' (1.524 m)   Wt 173 lb 1 oz (78.5 kg)   SpO2 97%   BMI 33.80 kg/m   HEMODYNAMICS:    VENTILATOR SETTINGS: FiO2 (%):  [55 %-100 %] 100 %  INTAKE / OUTPUT: I/O last 3 completed shifts: In: 2186 [P.O.:960; I.V.:1226] Out: 495 [Urine:495]  PHYSICAL EXAMINATION: General: This is a chronically ill-appearing patient who is very lethargic but not overtly distressed. Neuro: He is very lethargic but can be awakened by voice.  He may have a  slight anisocoria with a right pupil 3 mm on the left 2 mm.  He moves all fours on request. Cardiovascular: S1 and S2 are irregularly irregular without murmur.  He has no JVD.  There is a left carotid endarterectomy scar.  There is a median sternotomy scar.  The lower extremities are warm without edema but with chronic erythema. Lungs: Respirations are unlabored, there is symmetric air movement, no wheezes. Abdomen: The abdomen is  and soft the right sided ventral hernia.  There is no obvious masses or tenderness.   LABS:  BMET Recent Labs  Lab  09/16/17 0200 09/17/17 0436 09/18/17 0446  NA 135 139 136  K 3.6 3.3* 3.6  CL 93* 96* 94*  CO2 25 28 26   BUN 56* 53* 57*  CREATININE 1.59* 1.68* 2.54*  GLUCOSE 81 143* 137*    Electrolytes Recent Labs  Lab 09/15/17 1639 09/16/17 0200 09/17/17 0436 09/18/17 0446  CALCIUM  --  8.9 9.0 8.5*  MG 1.7  --   --   --     CBC Recent Labs  Lab 09/15/17 0910 09/15/17 0938 09/16/17 0200 09/18/17 0446  WBC 8.0  --  5.4 14.9*  HGB 10.4* 10.9* 9.7* 9.3*  HCT 32.7* 32.0* 31.2* 30.2*  PLT 281  --  270 259    Coag's No results for input(s): APTT, INR in the last 168 hours.  Sepsis Markers Recent Labs  Lab 09/17/17 1021  PROCALCITON 0.16    ABG Recent Labs  Lab 09/18/17 0840  PHART 7.456*  PCO2ART 38.7  PO2ART 118*    Liver Enzymes No results for input(s): AST, ALT, ALKPHOS, BILITOT, ALBUMIN in the last 168 hours.  Cardiac Enzymes Recent Labs  Lab 09/16/17 1159 09/16/17 1754 09/17/17 0436  TROPONINI 0.14* 0.17* 0.16*    Glucose Recent Labs  Lab 09/17/17 0743 09/17/17 1138 09/17/17 1620 09/17/17 2101 09/18/17 0809 09/18/17 1135  GLUCAP 125* 126* 130* 97 125* 129*    Imaging Dg Chest Port 1 View  Result Date: 09/18/2017 CLINICAL DATA:  Acute onset of tachypnea and dyspnea. Increased oxygen requirement. EXAM: PORTABLE CHEST 1 VIEW COMPARISON:  Chest radiograph performed 09/17/2016 FINDINGS: Right basilar and left perihilar airspace opacification may reflect pneumonia or pulmonary edema. No pleural effusion or pneumothorax is seen. The cardiomediastinal silhouette is borderline normal in size. The patient is status post median sternotomy. No acute osseous abnormalities are identified. IMPRESSION: Right basilar and left perihilar airspace opacification may reflect pneumonia or pulmonary edema. Electronically Signed   By: Garald Balding M.D.   On: 09/18/2017 03:12      ANTIBIOTICS: Tamiflu, aztreonam, and clindamycin  LINES/TUBES: There is a left  femoral introducer through which a pacing wire has been passed.  DISCUSSION:      This is a 62 year old diabetic with a history of LV dysfunction secondary to coronary artery disease as well as paroxysmal atrial fibrillation who presented with weakness, A. fib with rapid ventricular response, and intermittent pauses.  He has been found to be influenza A positive.  He has been placed on amiodarone and a temporary pacing wire has been placed.  His respiratory status is deteriorating as is his mental status.  ASSESSMENT / PLAN:  PULMONARY A: I very much suspect the infiltrates on his chest x-ray are infectious in origin probably influenza A but secondary bacterial infection must also be considered.  I am continuing his current coverage.  He currently has no indication for intubation, however with his unfavorable course throughout the past  24 hours the patient as he may require intubation should his AA gradient worsen or the work of breathing increase.    CARDIOVASCULAR A: Hypotension.  There are multiple considerations here.  He is chronically on Florinef and he may not be adequately replaced.  A serum cortisol is pending.  An echocardiogram is pending to evaluate his current LV function.  He is not overtly overloaded on examination, and I am concerned that he may in fact be intravascularly volume depleted.  I have submitted a serum lactate, and if it is elevated I will be inserting a central primarily to determine his central venous saturations to determine if interventions are necessary to increase his cardiac output.  For now he will be supported with levo fed.  RENAL A: I am aware of the recent rise in his creatinine and agree with holding diuresis as noted above  GASTROINTESTINAL A: Prophylaxis is with Protonix.  A history of chronic GI blood loss precluding long-term oral anticoagulation is noted.   INFECTIOUS A: Common secondary infections are covered on her current regimen.  ENDOCRINE A:  He is on sliding scale insulin.   NEUROLOGIC A: I am very concerned by his decline in mental status.  I am most concerned that this is metabolic in origin likely secondary to infection however with his atrial fibrillation and anticoagulation I think we need to ensure he has not suffered from a CNS event.  A CT scan of the head is pending.  32 minutes was spent in the care of this patient today  One[facility] [place] Pager: [phone]  09/18/2017, 3:18 PM   Addendum. CT shows old infarct. Question of malignant sinusitis. No meningismus on exam, not concerned about CNS extension but will need ENT consult. Lactate normal, will not fluid load for now.

## 2017-09-18 NOTE — Progress Notes (Addendum)
Subjective: Patient on Ventimask this morning after failing high flow [place] overnight 2/2 nasal congestion. He demonstrated tachypnea with shallow breaths and was very drowsy but rousable. He he states he has been feeling very lethargic and closes his as soon after answering questions. He denies any pain or shortness of breath.   Objective:  Vital signs in last 24 hours: Vitals:   09/18/17 0600 09/18/17 0700 09/18/17 0840 09/18/17 0843  BP: (!) 95/58 98/64    Pulse:  99    Resp: (!) 41 (!) 41    Temp:   99.8 F (37.7 C)   TempSrc:   Axillary   SpO2:  98%  99%  Weight:      Height:       Physical Exam  Constitutional: He appears well-developed and well-nourished.  Ill appearing and drowsy, in moderate distress  HENT:  Head: Normocephalic and atraumatic.  Eyes: EOM are normal. Right eye exhibits no discharge. Left eye exhibits no discharge.  Cardiovascular: Intact distal pulses.  Tachycardic Irregularly irregular rhythm Distant heart sounds  Pulmonary/Chest:  Tachypnea, Shallow breaths Moderate respiratory distress Poor Effort, Distant Breath sounds  Abdominal: Soft. Bowel sounds are normal. He exhibits no distension. There is no tenderness.  Musculoskeletal: He exhibits no edema.  Right midfoot amputation  Neurological:  Drowsy, Stirs to voice but drifts off quickly  Skin: Skin is warm and dry.  Excoriations bilateral LEs    Assessment/Plan:  62 yo M with History of Paroxysmal A. Fib, CHF (Last Echo 11/16/15 EF40-50%, CAD (s/p CABG 2017), Carotid stenosis (S/P L Carotid Endarterectomy), CVA, COPD, HTN, Abdominal Hernia, and Diabetes presented via [facility] with Generalized weakness and was found to be in A. Fib with RVR and having intermittent 6-10 second sinus pauses.  Tachy-Brady Syndrome with 6-10 second pauses, Atrial Fibrillation with RVR in the setting of Influenza A: Patient has continue to have episodes of A. Fib with RVR this admission. He has been restarted on  metoprolol and started on an amiodarone gtt 09/17/17, currently in A.fib with rate in the low 100s. Troponins trended down 2/10, suspect demand ischemia.  He was started on heparin by cardiology with anticipation of permanent pacemaker placement, after the procedure he will be discharged on Eliquis, Aspirin was discontinued. - Cardiology and EP following, PPM placement delayed 2/2 declining respiratory status - Continue amiodarone gtt cardiology. - Continue metoprolol 25 mg daily. - Echo pending, scheduled for today  Acute Respiratory Failure Aspiration Pneumonia: Worsening respiratory status likely multifactorial with Aspiration PNA, Flu, Tachy-Brady, and decreased patient effort. CXR 2/11 shows bilateral infiltrate concerning for Aspiration PNA, do not suspect pulmonary edema considering patient's Cr elevation this morning following diuretics yesterday and lact of signs of volume overload on exam. Overnight 2/10, the patient spike a fever of 103, and labs 2/11 show leukocytosis to 14.9. Will monitor response to antibiotic therapy, concern for possible ARDS if continued deterioration. - ABG today shows mild alkalemia (pH 7.456) with respiratory alkalosis (pCO2 38); Additional metabolic aalkalosis and gap metabolic acidosis suspected considering normal Bicarb and elevated GAP.  - Patient now on Clindamycin and Aztreonam (to cover gram - and anaerobes w/ PCN allergy) - Holding sedating medications - Flutter valve and incentive spirometry. - Patient remains on Venti mask - Will schedule duonebs - CBC in AM  AKI. Worsening today following IV diuresis 2/10. Cr 2.54, up from 1.68 yesterday.  - Holding Lasix 40 mg IV BID - NS @100mL /Hr - BMP in AM.  Hypotension, History of HTN:  Currently hypotensive to normotensive. -Continue Fludrocortisone 0.1mg  Daily -Continue metoprolol 25mg  Daily - NS @100mL /Hr  Influenza A: On Tamiflu. -Continue supportive care.  Hypokalemia. Improved. Potassium at 3.6  2/11. - BMP in the morning  HFEF: 02/15/16 EF45%-50%.Diffuse hypokinesis.  Echo was ordered yesterday-still pending. - Continue metoprolol. - Holding Lasix 40r mg IV BID - ECHO pending  DM: Stable. CBG 125. -Continue Lantus 10 units at bedtime. -Continue SSI.  CAD: Stable. S/P CABGx64/2017.  - Continue Atorvastatin - ASA discontinued by cardiology. - Continue metoprolol as above - Nitroglycerin PRN  COPD: - Will schedule Duoneb BID  Anxiety, Sciatica,?Chronic Pain. - Holding HomeClonazepam 0.5 qhs, Gabapentin 600mg  BID, Norco 5-325 in the setting of drowsiness in decreased respiratory effort. - Continue home Paroxetine 40mg  Daily  Dispo: Anticipated discharge in approximately 2-4 day(s).  Pending pacemaker placement and clinical course.  Neva Seat, MD 09/18/2017, 10:19 AM [phone]

## 2017-09-18 NOTE — Progress Notes (Signed)
Patient with tachypnea rate 40-50's with use of abdominal muscles. O2 sats down to 80 % on HFNC @ 15 L. Patient awakens to verbal stimulation. He denies shortness of breath. HR 103- Afib, BP with map 65. Encouraged patient to cough and take deep breaths. Patient has little effort. Initiated chest PT via bed. Dr. Berline Lopes notified.

## 2017-09-18 NOTE — Progress Notes (Signed)
ANTICOAGULATION CONSULT NOTE - Follow Up Consult  Pharmacy Consult for Heparin  Indication: atrial fibrillation  Allergies  Allergen Reactions  . Penicillins Hives, Nausea And Vomiting, Swelling and Other (See Comments)    Has patient had a PCN reaction causing immediate rash, facial/tongue/throat swelling, SOB or lightheadedness with hypotension: YES Has patient had a PCN reaction causing severe rash involving mucus membranes or skin necrosis: No Has patient had a PCN reaction that required hospitalization No Has patient had a PCN reaction occurring within the last 10 years: No If all of the above answers are "NO", then may proceed with Cephalosporin use.     Patient Measurements: Height: 5' (152.4 cm) Weight: 173 lb 1 oz (78.5 kg) IBW/kg (Calculated) : 50   Vital Signs: Temp: 99.8 F (37.7 C) (02/11 0840) Temp Source: Axillary (02/11 0840) BP: 98/64 (02/11 0700) Pulse Rate: 99 (02/11 0700)  Labs: Recent Labs    09/15/17 0910 09/15/17 0938  09/16/17 0200 09/16/17 1159 09/16/17 1754 09/17/17 0436 09/17/17 1332 09/18/17 0446  HGB 10.4* 10.9*  --  9.7*  --   --   --   --  9.3*  HCT 32.7* 32.0*  --  31.2*  --   --   --   --  30.2*  PLT 281  --   --  270  --   --   --   --  259  HEPARINUNFRC  --   --   --   --   --   --   --  0.32 0.36  CREATININE 1.69* 1.70*  --  1.59*  --   --  1.68*  --  2.54*  TROPONINI  --   --    < >  --  0.14* 0.17* 0.16*  --   --    < > = values in this interval not displayed.    Estimated Creatinine Clearance: 25.9 mL/min (A) (by C-G formula based on SCr of 2.54 mg/dL (H)).   Medical History: Past Medical History:  Diagnosis Date  . Abdominal hernia    Chronic, not a good surgical candidate  . Abscess, abdomen 12/31/2010   Referred to Wound Care in 01/2011 because of multiple abd abscess with VERY large ventral hernia (please look at image of CT abd/pelvis 09/2010).  Because of hernia I was hesitant to I&D.      Marland Kitchen Anemia    History of  Iron Def Anemia  . Anxiety   . AVM (arteriovenous malformation)    chronic GI blood loss  . Carotid artery disease ([place])    s/p CEA  . Chronic diastolic CHF (congestive heart failure) (HCC)    takes Lasix  . Chronic low back pain   . COPD (chronic obstructive pulmonary disease) ([place])   . CVA (cerebral infarction) 09/2010   Bilateral with Left > Right  . Diabetes mellitus   . GERD (gastroesophageal reflux disease)   . History of nuclear stress test    Myoview 9/16:  Inferior, apical and inf-lateral ischemia; not gated; High Risk  . Hx of echocardiogram    Echo 5/16:  Mild LVH, EF 55%, indeterm. diast function, WMA could not be ruled out, MAC, trivial MR, mild LAE, normal RVF //  b. Echo 4/17: EF 55-60%, no RWMA, Gr 2 DD, Ao sclerosis, MAC, mild MS, mild LAE, PASP 55 mHg  . Hyperlipidemia   . Hypertension   . Intellectual disability    Sister helps to take care of him and takes him  to appts  . Itching    all over body; pt scratches and has sores on bilateral arms and abdomen  . Lung nodule   . Myocardial infarction [facility]) 2016 ?   Heart attack  (  Per  pt. )  . Obesity   . Oxygen dependent    wears 2 liters at bedtime and when needed  . PAF (paroxysmal atrial fibrillation) ([place]) 06/2009   CHADS score 2 (HTN, DM) // Pradaxa for Afib // Pradaxa stopped due to worsening anemia (Hgb in 5/18 8.5 >> Pradaxa remains on hold)   . Pneumonia    hx of  . Stroke ([place])   . Tobacco user    Smokes 1ppd for multiple years.  Quit after hosp 09/2010.  . Tubular adenoma of colon   . Wears glasses     Assessment: 61yom admitted with flu.  Hx Afib previously in anticoagulation but stopped d/t GIB in 2015.  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  Plan to anticoagulate with heparin in meantime and apixaban post PPM.  -heparin level is at goal   Goal of Therapy:  Heparin level 0.3-0.7 units/ml Monitor platelets by anticoagulation protocol: Yes    Plan:  Continue Heparin drip 1050 uts/hr  Daily HL, CBC  Hildred Laser, Pharm D 09/18/2017 9:04 AM

## 2017-09-18 NOTE — Progress Notes (Signed)
Internal Medicine Attending  Date: 09/18/2017  Patient name: [name] Medical record number: [id number] Date of birth: 1956-01-24 Age: 62 y.o. Gender: male  I saw and evaluated the patient. I reviewed the resident's note by Dr. Trilby Drummer and I agree with the resident's findings and plans as documented in his progress note with the following corrections: When seen on rounds this morning he was on a partial nonrebreather mask not a Venti mask.  [name] clinical status has taken a turn for the worse. He has developed somnolence despite any possible offending medications having been discontinued this morning. An ABG demonstrated a PaCO2 of 38 so there was no CO2 narcosis also explaining this. A CT scan of the head to assess for a neurologic event was also negative, although was notable for some significant sinusitis. A chest x-ray from this morning revealed bilateral infiltrates that likely represent superior subsegmental as well as left medial basal subsegmental infiltrates. Given the location of these infiltrates and the fact that he had positive loss of consciousness I'm very concerned these may represent an aspiration pneumonitis and/or pneumonia. Being influenza A positive he could also have a bilateral superinfection. Because of a penicillin allergy we are somewhat limited in the antibiotics we can provide and have decided upon clindamycin and aztreonam. His blood pressure remains on the lower end and this may be related either to relative adrenal insufficiency or decreased intravascular volume. He is being gently hydrated and has been provided pressor support with levophed. We will have a very low threshold to begin stress dose hydrocortisone if needed. With his rapid shallow breathing and worsening oxygenation he may tire and require intubation, although upon our evaluation he was not there as of yet. Until these issues are addressed he is likely not a good candidate for placement of permanent  pacemaker and we will continue with his temporary transvenous pacing. We appreciate pulmonary critical care and cardiology's input and support in caring for [name].

## 2017-09-18 NOTE — Progress Notes (Addendum)
Pharmacy Antibiotic Note  [name] is a 62 y.o. male admitted on 09/15/2017 with tachy-brady syndrome, now w/ concern for pneumonia.  Pharmacy has been consulted for vancomycin and cefepime dosing.  Plan: Vancomycin 1500mg  x1 then 750mg  IV every 24 hours.  Goal trough 15-20 mcg/mL.  Cefepime 1g IV every 24 hours.  ADDENDUM: Now to change ABX to clindamycin for aspiration pna.  Will start clindamycin 600mg  IV Q8H.   VB   8:11 AM   Height: 5' (152.4 cm) Weight: 173 lb 1 oz (78.5 kg) IBW/kg (Calculated) : 50  Temp (24hrs), Avg:99.6 F (37.6 C), Min:98.1 F (36.7 C), Max:103 F (39.4 C)  Recent Labs  Lab 09/15/17 0910 09/15/17 0938 09/16/17 0200 09/17/17 0436 09/18/17 0446  WBC 8.0  --  5.4  --  14.9*  CREATININE 1.69* 1.70* 1.59* 1.68* 2.54*    Estimated Creatinine Clearance: 25.9 mL/min (A) (by C-G formula based on SCr of 2.54 mg/dL (H)).    Allergies  Allergen Reactions  . Penicillins Hives, Nausea And Vomiting, Swelling and Other (See Comments)    Has patient had a PCN reaction causing immediate rash, facial/tongue/throat swelling, SOB or lightheadedness with hypotension: YES Has patient had a PCN reaction causing severe rash involving mucus membranes or skin necrosis: No Has patient had a PCN reaction that required hospitalization No Has patient had a PCN reaction occurring within the last 10 years: No If all of the above answers are "NO", then may proceed with Cephalosporin use.      Thank you for allowing pharmacy to be a part of this patient's care.  Wynona Neat, PharmD, BCPS  09/18/2017 6:54 AM

## 2017-09-19 ENCOUNTER — Other Ambulatory Visit (HOSPITAL_COMMUNITY): Payer: Medicare (Managed Care)

## 2017-09-19 ENCOUNTER — Inpatient Hospital Stay (HOSPITAL_COMMUNITY): Payer: Medicare (Managed Care)

## 2017-09-19 DIAGNOSIS — J9601 Acute respiratory failure with hypoxia: Secondary | ICD-10-CM

## 2017-09-19 DIAGNOSIS — J690 Pneumonitis due to inhalation of food and vomit: Secondary | ICD-10-CM

## 2017-09-19 DIAGNOSIS — R579 Shock, unspecified: Secondary | ICD-10-CM

## 2017-09-19 DIAGNOSIS — J09X2 Influenza due to identified novel influenza A virus with other respiratory manifestations: Secondary | ICD-10-CM

## 2017-09-19 DIAGNOSIS — J449 Chronic obstructive pulmonary disease, unspecified: Secondary | ICD-10-CM

## 2017-09-19 LAB — POCT I-STAT 3, ART BLOOD GAS (G3+)
ACID-BASE EXCESS: 1 mmol/L (ref 0.0–2.0)
Acid-base deficit: 4 mmol/L — ABNORMAL HIGH (ref 0.0–2.0)
BICARBONATE: 25.2 mmol/L (ref 20.0–28.0)
BICARBONATE: 19.9 mmol/L — AB (ref 20.0–28.0)
O2 Saturation: 98 %
O2 Saturation: 90 %
PO2 ART: 106 mmHg (ref 83.0–108.0)
Patient temperature: 97.7
Patient temperature: 100.1
TCO2: 26 mmol/L (ref 22–32)
TCO2: 21 mmol/L — AB (ref 22–32)
pCO2 arterial: 36.7 mmHg (ref 32.0–48.0)
pCO2 arterial: 33.9 mmHg (ref 32.0–48.0)
pH, Arterial: 7.442 (ref 7.350–7.450)
pH, Arterial: 7.381 (ref 7.350–7.450)
pO2, Arterial: 63 mmHg — ABNORMAL LOW (ref 83.0–108.0)

## 2017-09-19 LAB — CBC
HEMATOCRIT: 30.7 % — AB (ref 39.0–52.0)
Hemoglobin: 9.7 g/dL — ABNORMAL LOW (ref 13.0–17.0)
MCH: 26.7 pg (ref 26.0–34.0)
MCHC: 31.6 g/dL (ref 30.0–36.0)
MCV: 84.6 fL (ref 78.0–100.0)
PLATELETS: 226 10*3/uL (ref 150–400)
RBC: 3.63 MIL/uL — AB (ref 4.22–5.81)
RDW: 15.2 % (ref 11.5–15.5)
WBC: 13.7 10*3/uL — ABNORMAL HIGH (ref 4.0–10.5)

## 2017-09-19 LAB — BASIC METABOLIC PANEL
Anion gap: 16 — ABNORMAL HIGH (ref 5–15)
BUN: 69 mg/dL — AB (ref 6–20)
CO2: 24 mmol/L (ref 22–32)
Calcium: 8.2 mg/dL — ABNORMAL LOW (ref 8.9–10.3)
Chloride: 93 mmol/L — ABNORMAL LOW (ref 101–111)
Creatinine, Ser: 3.35 mg/dL — ABNORMAL HIGH (ref 0.61–1.24)
GFR calc Af Amer: 21 mL/min — ABNORMAL LOW (ref >60)
GFR calc non Af Amer: 18 mL/min — ABNORMAL LOW (ref >60)
Glucose, Bld: 130 mg/dL — ABNORMAL HIGH (ref 65–99)
POTASSIUM: 4.2 mmol/L (ref 3.5–5.1)
Sodium: 133 mmol/L — ABNORMAL LOW (ref 135–145)

## 2017-09-19 LAB — GLUCOSE, CAPILLARY
GLUCOSE-CAPILLARY: 128 mg/dL — AB (ref 65–99)
GLUCOSE-CAPILLARY: 198 mg/dL — AB (ref 65–99)
Glucose-Capillary: 125 mg/dL — ABNORMAL HIGH (ref 65–99)

## 2017-09-19 LAB — HEPARIN LEVEL (UNFRACTIONATED)
Heparin Unfractionated: 0.2 IU/mL — ABNORMAL LOW (ref 0.30–0.70)
Heparin Unfractionated: 0.25 IU/mL — ABNORMAL LOW (ref 0.30–0.70)

## 2017-09-19 LAB — PROCALCITONIN: Procalcitonin: 1.85 ng/mL

## 2017-09-19 MED ORDER — FENTANYL BOLUS VIA INFUSION
50.0000 ug | INTRAVENOUS | Status: DC | PRN
Start: 2017-09-19 — End: 2017-09-26
  Administered 2017-09-21 – 2017-09-23 (×5): 50 ug via INTRAVENOUS
  Filled 2017-09-19: qty 50

## 2017-09-19 MED ORDER — AMIODARONE LOAD VIA INFUSION
150.0000 mg | Freq: Once | INTRAVENOUS | Status: AC
  Administered 2017-09-19: 150 mg via INTRAVENOUS
  Filled 2017-09-19: qty 83.34

## 2017-09-19 MED ORDER — FENTANYL CITRATE (PF) 100 MCG/2ML IJ SOLN: 50.0000 ug | Freq: Once | INTRAMUSCULAR | Status: DC

## 2017-09-19 MED ORDER — DEXMEDETOMIDINE HCL IN NACL 200 MCG/50ML IV SOLN
0.4000 ug/kg/h | INTRAVENOUS | Status: DC
  Administered 2017-09-19: 0.4 ug/kg/h via INTRAVENOUS
  Administered 2017-09-19: 0.8 ug/kg/h via INTRAVENOUS
  Administered 2017-09-19 – 2017-09-20 (×2): 0.6 ug/kg/h via INTRAVENOUS
  Administered 2017-09-20: 0.4 ug/kg/h via INTRAVENOUS
  Administered 2017-09-20: 0.6 ug/kg/h via INTRAVENOUS
  Administered 2017-09-20: 0.4 ug/kg/h via INTRAVENOUS
  Administered 2017-09-21: 0.6 ug/kg/h via INTRAVENOUS
  Administered 2017-09-21: 0.5 ug/kg/h via INTRAVENOUS
  Administered 2017-09-21: 0.6 ug/kg/h via INTRAVENOUS
  Administered 2017-09-21 (×2): 0.7 ug/kg/h via INTRAVENOUS
  Administered 2017-09-21: 0.8 ug/kg/h via INTRAVENOUS
  Administered 2017-09-22: 1 ug/kg/h via INTRAVENOUS
  Administered 2017-09-22: 0.4 ug/kg/h via INTRAVENOUS
  Administered 2017-09-22: 1 ug/kg/h via INTRAVENOUS
  Administered 2017-09-22: 0.4 ug/kg/h via INTRAVENOUS
  Administered 2017-09-23 (×3): 1 ug/kg/h via INTRAVENOUS
  Administered 2017-09-23: 0.7 ug/kg/h via INTRAVENOUS
  Administered 2017-09-23 (×2): 0.6 ug/kg/h via INTRAVENOUS
  Administered 2017-09-23: 1 ug/kg/h via INTRAVENOUS
  Administered 2017-09-24 – 2017-09-25 (×8): 0.7 ug/kg/h via INTRAVENOUS
  Administered 2017-09-25: 0.4 ug/kg/h via INTRAVENOUS
  Administered 2017-09-25: 0.7 ug/kg/h via INTRAVENOUS
  Administered 2017-09-25: 0.6 ug/kg/h via INTRAVENOUS
  Administered 2017-09-26: 0.8 ug/kg/h via INTRAVENOUS
  Administered 2017-09-26: 0.4 ug/kg/h via INTRAVENOUS
  Filled 2017-09-19 (×33): qty 50
  Filled 2017-09-19: qty 100
  Filled 2017-09-19 (×5): qty 50

## 2017-09-19 MED ORDER — LINEZOLID 600 MG/300ML IV SOLN
600.0000 mg | Freq: Two times a day (BID) | INTRAVENOUS | Status: AC
  Administered 2017-09-19 – 2017-09-26 (×16): 600 mg via INTRAVENOUS
  Filled 2017-09-19 (×18): qty 300

## 2017-09-19 MED ORDER — SODIUM CHLORIDE 0.9 % IV SOLN
1.0000 g | INTRAVENOUS | Status: DC
  Administered 2017-09-20 – 2017-09-21 (×2): 1 g via INTRAVENOUS
  Filled 2017-09-19 (×2): qty 1

## 2017-09-19 MED ORDER — SODIUM CHLORIDE 0.9 % IV SOLN
2.0000 g | Freq: Once | INTRAVENOUS | Status: AC
  Administered 2017-09-19: 2 g via INTRAVENOUS
  Filled 2017-09-19: qty 2

## 2017-09-19 MED ORDER — AMIODARONE IV BOLUS ONLY 150 MG/100ML: 150.0000 mg | Freq: Once | INTRAVENOUS | Status: DC

## 2017-09-19 MED ORDER — FENTANYL 2500MCG IN NS 250ML (10MCG/ML) PREMIX INFUSION
25.0000 ug/h | INTRAVENOUS | Status: DC
  Administered 2017-09-19: 25 ug/h via INTRAVENOUS
  Administered 2017-09-21: 50 ug/h via INTRAVENOUS
  Administered 2017-09-22: 200 ug/h via INTRAVENOUS
  Administered 2017-09-23: 50 ug/h via INTRAVENOUS
  Administered 2017-09-24 – 2017-09-26 (×2): 75 ug/h via INTRAVENOUS
  Filled 2017-09-19 (×6): qty 250

## 2017-09-19 MED ORDER — MIDAZOLAM HCL 2 MG/2ML IJ SOLN
INTRAMUSCULAR | Status: AC
  Administered 2017-09-19: 2 mg
  Filled 2017-09-19: qty 2

## 2017-09-19 MED ORDER — FENTANYL CITRATE (PF) 100 MCG/2ML IJ SOLN
INTRAMUSCULAR | Status: AC
  Administered 2017-09-19: 100 ug
  Filled 2017-09-19: qty 2

## 2017-09-19 MED ORDER — ORAL CARE MOUTH RINSE
15.0000 mL | OROMUCOSAL | Status: DC
  Administered 2017-09-19 – 2017-09-21 (×9): 15 mL via OROMUCOSAL

## 2017-09-19 MED ORDER — ETOMIDATE 2 MG/ML IV SOLN
20.0000 mg | Freq: Once | INTRAVENOUS | Status: AC
  Administered 2017-09-19: 20 mg via INTRAVENOUS

## 2017-09-19 NOTE — Consult Note (Signed)
I have been asked to see the patient by Dr. [name], for evaluation and management of difficult foley.  History of present illness: 62 year old male who was in the heart ICU intubated and sedated with a diagnosis of influenza.  Part of his care required an indwelling Foley catheter.  Several attempts were made by nursing staff unsuccessfully.  Urology was consulted for Foley catheter placement.  Review of systems: A 12 point comprehensive review of systems was unable to be obtained due to patient's condition.  Patient Active Problem List   Diagnosis Date Noted  . Shock circulatory ([place])   . Aspiration pneumonia of both lower lobes ([place])   . Influenza with respiratory manifestation   . Tachy-brady syndrome ([place])   . Sinus pause   . Intertrigo   . Right bundle branch block (RBBB) on electrocardiography   . Dysphagia   . Sepsis ([place]) 02/13/2016  . Ischemic cardiomyopathy 09/17/2015  . CAD (coronary artery disease) 07/14/2015  . Chronic combined systolic and diastolic CHF (congestive heart failure) ([place]) 07/14/2015  . Abrasion of abdominal wall 10/26/2014  . Acute respiratory failure with hypoxia ([place]) 10/26/2014  . Gastritis and gastroduodenitis 06/10/2014  . Benign neoplasm of ascending colon 06/10/2014  . 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 Wound, open, foot 12/13/2012  . IDA (iron deficiency anemia) 12/13/2012  . Hyperkalemia 05/22/2012  . Diabetic leg ulcer ([place]) 05/15/2012  . Routine health maintenance 05/15/2012  . Leg weakness, bilateral 12/10/2011  . Carotid artery disease ([place]) 08/31/2011  . Sciatica 05/22/2011  . Ventral hernia without obstruction or gangrene 01/24/2011  . Incidental lung nodule, greater than or equal to 90m 11/24/2010  . CVA (cerebral vascular accident) ([facility] 10/24/2010  . Paroxysmal atrial fibrillation with rapid ventricular response ([place] [place] 07/24/2009  . Anxiety state 06/22/2007  . DM (diabetes mellitus),  type 2, uncontrolled ([place] 12/22/2006  . Essential hypertension 12/22/2006  . HYPERCHOLESTEROLEMIA 10/05/2006  . TOBACCO DEPENDENCE 10/05/2006  . Unspecified intellectual disabilities 10/05/2006  . COPD (chronic obstructive pulmonary disease) ([facility] 10/05/2006    No current facility-administered medications on file prior to encounter.    Current Outpatient Medications on File Prior to Encounter  Medication Sig Dispense Refill  . acetaminophen (TYLENOL) 325 MG tablet Take 650 mg by mouth 3 (three) times daily as needed for mild pain.    .Marland KitchenamLODipine (NORVASC) 5 MG tablet Take 5 mg by mouth daily.    .Marland Kitchenaspirin EC 81 MG tablet Take 81 mg by mouth daily.    . bisoprolol (ZEBETA) 5 MG tablet Take 5 mg by mouth daily.    . calcium carbonate (TUMS - DOSED IN MG ELEMENTAL CALCIUM) 500 MG chewable tablet Chew 1 tablet by mouth 3 (three) times daily as needed for indigestion or heartburn.    . Cholecalciferol (VITAMIN D3) 1000 UNITS CAPS Take 1,000 Units by mouth daily.    . clonazePAM (KLONOPIN) 0.5 MG tablet Take 1 tablet (0.5 mg total) by mouth at bedtime. 30 tablet 0  . furosemide (LASIX) 80 MG tablet Take 1.5 tablets (120 mg total) by mouth 2 (two) times daily. (Patient taking differently: Take 80-120 mg by mouth See admin instructions. 120 mg by mouth in the morning and 80 mg at bedtime)    . gabapentin (NEURONTIN) 600 MG tablet Take 600 mg by mouth 2 (two) times daily.    .Marland KitchenHYDROcodone-acetaminophen (NORCO) 5-325 MG tablet Take 1 tablet by mouth daily.    . Insulin Glargine (LANTUS)  100 UNIT/ML Solostar Pen Inject 10 Units into the skin every evening.    Marland Kitchen ipratropium-albuterol (DUONEB) 0.5-2.5 (3) MG/3ML SOLN Take 3 mLs by nebulization 2 (two) times daily.    . metFORMIN (GLUCOPHAGE) 1000 MG tablet Take 1,000 mg by mouth 2 (two) times daily with a meal.    . metolazone (ZAROXOLYN) 2.5 MG tablet Take 2.5 mg by mouth every Monday, Wednesday, and Friday.    . nitroGLYCERIN (NITROSTAT) 0.4 MG SL  tablet Place 0.4 mg under the tongue every 5 (five) minutes as needed for chest pain.    . OXYGEN Inhale 2 L into the lungs as needed (to supplement).    . pantoprazole (PROTONIX) 40 MG tablet Take 40 mg by mouth daily.    Marland Kitchen PARoxetine (PAXIL) 40 MG tablet Take 40 mg by mouth daily.     . polyvinyl alcohol (LIQUIFILM TEARS) 1.4 % ophthalmic solution Place 1 drop into both eyes 2 (two) times daily as needed for dry eyes. Natural Tears    . Rivaroxaban (XARELTO) 15 MG TABS tablet Take 15 mg by mouth every evening.    . rosuvastatin (CRESTOR) 40 MG tablet Take 40 mg by mouth at bedtime.    . Skin Protectants, Misc. (EUCERIN) cream Apply 1 application topically 2 (two) times daily. TO AFFECTED AREAS      Past Medical History:  Diagnosis Date  . Abdominal hernia    Chronic, not a good surgical candidate  . Abscess, abdomen 12/31/2010   Referred to Wound Care in 01/2011 because of multiple abd abscess with VERY large ventral hernia (please look at image of CT abd/pelvis 09/2010).  Because of hernia I was hesitant to I&D.      Marland Kitchen Anemia    History of Iron Def Anemia  . Anxiety   . AVM (arteriovenous malformation)    chronic GI blood loss  . Carotid artery disease ([place])    s/p CEA  . Chronic diastolic CHF (congestive heart failure) ([facility])    takes Lasix  . Chronic low back pain   . COPD (chronic obstructive pulmonary disease) ([place])   . CVA (cerebral infarction) 09/2010   Bilateral with Left > Right  . Diabetes mellitus   . GERD (gastroesophageal reflux disease)   . History of nuclear stress test    Myoview 9/16:  Inferior, apical and inf-lateral ischemia; not gated; High Risk  . Hx of echocardiogram    Echo 5/16:  Mild LVH, EF 55%, indeterm. diast function, WMA could not be ruled out, MAC, trivial MR, mild LAE, normal RVF //  b. Echo 4/17: EF 55-60%, no RWMA, Gr 2 DD, Ao sclerosis, MAC, mild MS, mild LAE, PASP 55 mHg  . Hyperlipidemia   . Hypertension   . Intellectual disability    Sister  helps to take care of him and takes him to appts  . Itching    all over body; pt scratches and has sores on bilateral arms and abdomen  . Lung nodule   . Myocardial infarction [facility]) 2016 ?   Heart attack  (  Per  pt. )  . Obesity   . Oxygen dependent    wears 2 liters at bedtime and when needed  . PAF (paroxysmal atrial fibrillation) ([place]) 06/2009   CHADS score 2 (HTN, DM) // Pradaxa for Afib // Pradaxa stopped due to worsening anemia (Hgb in 5/18 8.5 >> Pradaxa remains on hold)   . Pneumonia    hx of  . Stroke ([place])   .  Tobacco user    Smokes 1ppd for multiple years.  Quit after hosp 09/2010.  . Tubular adenoma of colon   . Wears glasses     Past Surgical History:  Procedure Laterality Date  . AMPUTATION Right 03/29/2013   Procedure: AMPUTATION RAY;  Surgeon: Newt Minion, MD;  Location: [facility];  Service: Orthopedics;  Laterality: Right;  Right Foot 3rd and Possible 4th Ray Amputation  . AMPUTATION Right 04/23/2013   Procedure: AMPUTATION RIGHT MID-FOOT;  Surgeon: Newt Minion, MD;  Location: [facility];  Service: Orthopedics;  Laterality: Right;  . arm surgery Left    as a child  . CARDIAC CATHETERIZATION N/A 04/30/2015   Procedure: Left Heart Cath and Coronary Angiography;  Surgeon: Belva Crome, MD;  Location: [facility];  Service: Cardiovascular;  Laterality: N/A;  . Carotid arteriogram  10/2010   30% right ICA stenosis, 40% left ICA stenosis   . CAROTID ENDARTERECTOMY Left 08-13-15   CEA  . CATARACT EXTRACTION, BILATERAL    . COLONOSCOPY WITH PROPOFOL N/A 06/10/2014   Procedure: COLONOSCOPY WITH PROPOFOL;  Surgeon: Jerene Bears, MD;  [facility];  Service: Gastroenterology;  Laterality: N/A;  . CORONARY ARTERY BYPASS GRAFT N/A 08/13/2015   Procedure: CORONARY ARTERY BYPASS GRAFTING (CABG), ON PUMP, TIMES THREE, USING LEFT INTERNAL MAMMARY ARTERY, RIGHT GREATER SAPHENOUS VEIN HARVESTED ENDOSCOPICALLY;  Surgeon: [name], MD;  Location: [facility];  Service: Open  Heart Surgery;  Laterality: N/A;  . DEBRIDMENT OF DECUBITUS ULCER Right 02/13/2013  . ENDARTERECTOMY Left 08/13/2015   Procedure: ENDARTERECTOMY CAROTID;  Surgeon: Angelia Mould, MD;  Location: [facility];  Service: Vascular;  Laterality: Left;  . ESOPHAGOGASTRODUODENOSCOPY (EGD) WITH PROPOFOL N/A 06/10/2014   Procedure: ESOPHAGOGASTRODUODENOSCOPY (EGD) WITH PROPOFOL;  Surgeon: Jerene Bears, MD;  [facility];  Service: Gastroenterology;  Laterality: N/A;  . GIVENS CAPSULE STUDY N/A 07/09/2014   Procedure: GIVENS CAPSULE STUDY;  Surgeon: Jerene Bears, MD;  [facility];  Service: Gastroenterology;  Laterality: N/A;  . I&D EXTREMITY Right 02/13/2013   Procedure: IRRIGATION AND DEBRIDEMENT FOOT ULCER;  Surgeon: Johnny Bridge, MD;  Location: [facility];  Service: Orthopedics;  Laterality: Right;  PULSE LAVAGE  . MULTIPLE TOOTH EXTRACTIONS    . TEE WITHOUT CARDIOVERSION N/A 08/13/2015   Procedure: TRANSESOPHAGEAL ECHOCARDIOGRAM (TEE);  Surgeon: [name], MD;  Location: [facility];  Service: Open Heart Surgery;  Laterality: N/A;  . TEMPORARY PACEMAKER N/A 09/15/2017   Procedure: TEMPORARY PACEMAKER;  Surgeon: Jettie Booze, MD;  Location: [facility];  Service: Cardiovascular;  Laterality: N/A;  . TRANSESOPHAGEAL ECHOCARDIOGRAM  09/2010   No ASD or PFO. EF 60-65%.  Normal systolic function. No evidence of thrombus.   . TRANSTHORACIC ECHOCARDIOGRAM  09/2010    The cavity size was normal. Systolic function was vigorous.  EF 65-70%.  Normal wall funciton.   Marland Kitchen ULTRASOUND GUIDANCE FOR VASCULAR ACCESS  09/15/2017   Procedure: Ultrasound Guidance For Vascular Access;  Surgeon: Jettie Booze, MD;  Location: [facility];  Service: Cardiovascular;;    Social History   Tobacco Use  . Smoking status: Former Smoker    Packs/day: 1.00    Years: 42.00    Pack years: 42.00    Types: Cigarettes, Pipe    Last attempt to quit: 11/10/2010    Years since quitting: 6.8  .  Smokeless tobacco: Former Systems developer    Quit date: 10/02/2010  Substance Use Topics  .  Alcohol use: No    Alcohol/week: 0.0 oz    Comment: " last drink was 2003" 08/11/15  . Drug use: No    Family History  Problem Relation Age of Onset  . Heart disease Mother   . Hypertension Sister   . Heart disease Brother   . Heart disease Father   . Peripheral vascular disease Father   . Heart disease Maternal Grandmother   . Heart attack Maternal Grandmother   . Breast cancer Maternal Grandmother   . Stomach cancer Maternal Uncle   . Colon cancer Neg Hx   . Stroke Neg Hx     PE: Vitals:   09/19/17 1548 09/19/17 1600 09/19/17 1624 09/19/17 1630  BP:  103/71  (!) 80/53  Pulse:  (!) 58  (!) 132  Resp:  (!) 25  (!) 28  Temp: 98.4 F (36.9 C) 98.4 F (36.9 C)    TempSrc: Oral Oral    SpO2:  94% (!) 86% (!) 79%  Weight:      Height:       Patient intubated and sedated appears to be in no acute distress  Atraumatic normocephalic head Nonlabored breathing Abdomen is soft, nontender, nondistended,  Lower extremities are symmetric pitting ankle edema Patient's penis was phimotic, unable to retract Grossly neurologically intact No identifiable skin lesions  Recent Labs    09/18/17 0446 09/19/17 0328  WBC 14.9* 13.7*  HGB 9.3* 9.7*  HCT 30.2* 30.7*   Recent Labs    09/17/17 0436 09/18/17 0446 09/19/17 0328  NA 139 136 133*  K 3.3* 3.6 4.2  CL 96* 94* 93*  CO2 28 26 24   GLUCOSE 143* 137* 130*  BUN 53* 57* 69*  CREATININE 1.68* 2.54* 3.35*  CALCIUM 9.0 8.5* 8.2*   No results for input(s): LABPT, INR in the last 72 hours. No results for input(s): LABURIN in the last 72 hours. Results for orders placed or performed during the hospital encounter of 09/15/17  MRSA PCR Screening     Status: None   Collection Time: 09/15/17  5:30 PM  Result Value Ref Range Status   MRSA by PCR NEGATIVE NEGATIVE Final    Comment:        The GeneXpert MRSA Assay (FDA approved for NASAL  specimens only), is one component of a comprehensive MRSA colonization surveillance program. It is not intended to diagnose MRSA infection nor to guide or monitor treatment for MRSA infections. Performed at [facility], [place] [address], [place], [place] [address]   Culture, blood (routine x 2)     Status: None (Preliminary result)   Collection Time: 09/15/17  7:19 PM  Result Value Ref Range Status   Specimen Description BLOOD BLOOD LEFT HAND  Final   Special Requests IN PEDIATRIC BOTTLE Blood Culture adequate volume  Final   Culture   Final    NO GROWTH 4 DAYS Performed at [facility], [place] [address] [place], [place] [address]    Report Status PENDING  Incomplete  Culture, blood (routine x 2)     Status: None (Preliminary result)   Collection Time: 09/15/17  7:20 PM  Result Value Ref Range Status   Specimen Description BLOOD BLOOD RIGHT HAND  Final   Special Requests AEROBIC BOTTLE ONLY Blood Culture adequate volume  Final   Culture   Final    NO GROWTH 4 DAYS Performed at [facility], [place] [address], [place], [place] [address]    Report Status  PENDING  Incomplete  Culture, blood (routine x 2)     Status: None (Preliminary result)   Collection Time: 09/18/17  9:24 AM  Result Value Ref Range Status   Specimen Description BLOOD LEFT ARM  Final   Special Requests BLOOD AEROBIC BOTTLE Blood Culture adequate volume  Final   Culture   Final    NO GROWTH < 24 HOURS Performed at [facility], 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  At this point, would recommend that the patient's catheter remain in place for the remainder of his critical care stay.  It should be removed once he is able to stand and void on his own.  He can follow-up with me once he is out of the hospital for further discussion of his phimosis and potential circumcision.  Thank you for involving me in this patient's care, Please page with any further questions or concerns. Louis Meckel W

## 2017-09-19 NOTE — Procedures (Signed)
Intubation Procedure Note [name] [id number] Sep 02, 1955  Procedure: Intubation Indications: Respiratory insufficiency  Procedure Details Consent: Risks of procedure as well as the alternatives and risks of each were explained to the (patient/caregiver).  Consent for procedure obtained. Time Out: Verified patient identification, verified procedure, site/side was marked, verified correct patient position, special equipment/implants available, medications/allergies/relevent history reviewed, required imaging and test results available.  Performed  Maximum sterile technique was used including gloves, hand hygiene and mask.  MAC    Evaluation Hemodynamic Status: BP stable throughout; O2 sats: stable throughout Patient's Current Condition: stable Complications: No apparent complications Patient did tolerate procedure well. Chest X-ray ordered to verify placement.  CXR: pending.   [name] 09/19/2017

## 2017-09-19 NOTE — Progress Notes (Addendum)
Pharmacy Antibiotic Note  SAHITH NURSE is a 62 y.o. male with afib with tachy/brady with temp pacer in place now respiratory failure and shock. Pharmacy consulted to dose cefepime and MD adding Zyvox. He also just completed a course of Tamiflu -WBC= 12.7, SCr= 3.35 (up, CrCl ~ 20)  -PCN allergy noted but has tolerated cefepime in the past  Plan: -Cefepime 2gm IV x1 then 1gm IV q24h -Will follow renal function, cultures and clinical progress    Height: 5' (152.4 cm) Weight: 171 lb 8.3 oz (77.8 kg) IBW/kg (Calculated) : 50  Temp (24hrs), Avg:98.8 F (37.1 C), Min:97.3 F (36.3 C), Max:101.6 F (38.7 C)  Recent Labs  Lab 09/15/17 0910 09/15/17 0938 09/16/17 0200 09/17/17 0436 09/18/17 0446 09/18/17 1520 09/18/17 2051 09/19/17 0328  WBC 8.0  --  5.4  --  14.9*  --   --  13.7*  CREATININE 1.69* 1.70* 1.59* 1.68* 2.54*  --   --  3.35*  LATICACIDVEN  --   --   --   --   --  1.2 1.3  --     Estimated Creatinine Clearance: 19.5 mL/min (A) (by C-G formula based on SCr of 3.35 mg/dL (H)).    Allergies  Allergen Reactions  . Penicillins Hives, Nausea And Vomiting, Swelling and Other (See Comments)    Has patient had a PCN reaction causing immediate rash, facial/tongue/throat swelling, SOB or lightheadedness with hypotension: YES Has patient had a PCN reaction causing severe rash involving mucus membranes or skin necrosis: No Has patient had a PCN reaction that required hospitalization No Has patient had a PCN reaction occurring within the last 10 years: No If all of the above answers are "NO", then may proceed with Cephalosporin use.      Thank you for allowing pharmacy to be a part of this patient's care.  Hildred Laser, Pharm D 09/19/2017 10:30 AM

## 2017-09-19 NOTE — Progress Notes (Signed)
PULMONARY / CRITICAL CARE MEDICINE   Name: [name] MRN: [id number] DOB: 06/09/1956    ADMISSION DATE:  09/15/2017 CONSULTATION DATE: 09/18/2017  REFERRING MD: Neva Seat  Reason for consult: hypotension and worsening aA gradient  HISTORY OF PRESENT ILLNESS:        This is a 62 year old diabetic with a history of coronary disease and congestive heart failure with a baseline ejection fraction of 40-50%.  He was admitted on 2/8 complaining of generalized weakness and was found to be in atrial fibrillation with a rapid ventricular response with intermittent 7-8-second pauses.  A temporary pacing wire was placed.  He was noted to have infiltrates or CHF on his chest x-ray.  PCR for influenza a was positive.  He was started on a combination of Tamiflu as well as clindamycin and aztreonam due to a penicillin allergy.  He was diuresed but diuretics are currently being held as his creatinine has risen from approximately 0.9-2.5.  I was called today for hypotension and increasing oxygen requirements.  On my arrival the patient's mental status is very poor.  He is extremely lethargic and not giving any significant history.  He has had a nonproductive cough.  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  PAST MEDICAL HISTORY :  He  has a past medical history of Abdominal hernia, Abscess, abdomen (12/31/2010), Anemia, Anxiety, AVM (arteriovenous malformation), Carotid artery disease (HCC), Chronic diastolic CHF (congestive heart failure) ([place]), Chronic low back pain, COPD (chronic obstructive pulmonary disease) ([place]), CVA (cerebral infarction) (09/2010), Diabetes mellitus, GERD (gastroesophageal reflux disease), History of nuclear stress test, echocardiogram, Hyperlipidemia, Hypertension, Intellectual disability, Itching, Lung nodule, Myocardial infarction ([place]) (2016 ?), Obesity, Oxygen dependent, PAF (paroxysmal atrial fibrillation)  ([place]) (06/2009), Pneumonia, Stroke ([place]), Tobacco user, Tubular adenoma of colon, and Wears glasses.  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  Allergies  Allergen Reactions  . Penicillins Hives, Nausea And Vomiting, Swelling and Other (See Comments)    Has patient had a PCN reaction causing immediate rash, facial/tongue/throat swelling, SOB or lightheadedness with hypotension: YES Has patient had a PCN reaction causing severe rash involving mucus membranes or skin necrosis: No Has patient had a PCN reaction that required hospitalization No Has patient had a PCN reaction occurring within the last 10 years: No If all of the above answers are "NO", then may proceed with Cephalosporin use.     No current facility-administered medications on file prior to encounter.    Current Outpatient Medications on File Prior to Encounter  Medication Sig  . acetaminophen (TYLENOL) 325 MG tablet Take 650 mg by mouth 3 (three) times daily as needed for mild pain.  Marland Kitchen amLODipine (NORVASC) 5 MG tablet Take 5 mg by mouth daily.  Marland Kitchen aspirin EC 81 MG tablet Take 81 mg by mouth daily.  . bisoprolol (ZEBETA) 5 MG tablet Take 5  mg by mouth daily.  . calcium carbonate (  TUMS - DOSED IN MG ELEMENTAL CALCIUM) 500 MG chewable tablet Chew 1 tablet by mouth 3 (three) times daily as needed for indigestion or heartburn.  . Cholecalciferol (VITAMIN D3) 1000 UNITS CAPS Take 1,000 Units by mouth daily.  . clonazePAM (KLONOPIN) 0.5 MG tablet Take 1 tablet (0.5 mg total) by mouth at bedtime.  . furosemide (LASIX) 80 MG tablet Take 1.5 tablets (120 mg total) by mouth 2 (two) times daily. (Patient taking differently: Take 80-120 mg by mouth See admin instructions. 120 mg by mouth in the morning and 80 mg at bedtime)  . gabapentin (NEURONTIN) 600 MG tablet Take 600 mg by mouth 2 (two) times daily.  Marland Kitchen HYDROcodone-acetaminophen (NORCO) 5-325 MG tablet Take 1 tablet by mouth daily.  . Insulin Glargine (LANTUS) 100 UNIT/ML Solostar Pen Inject 10 Units into the skin every evening.  Marland Kitchen ipratropium-albuterol (DUONEB) 0.5-2.5 (3) MG/3ML SOLN Take 3 mLs by nebulization 2 (two) times daily.  . metFORMIN (GLUCOPHAGE) 1000 MG tablet Take 1,000 mg by mouth 2 (two) times daily with a meal.  . metolazone (ZAROXOLYN) 2.5 MG tablet Take 2.5 mg by mouth every Monday, Wednesday, and Friday.  . nitroGLYCERIN (NITROSTAT) 0.4 MG SL tablet Place 0.4 mg under the tongue every 5 (five) minutes as needed for chest pain.  . OXYGEN Inhale 2 L into the lungs as needed (to supplement).  . pantoprazole (PROTONIX) 40 MG tablet Take 40 mg by mouth daily.  Marland Kitchen PARoxetine (PAXIL) 40 MG tablet Take 40 mg by mouth daily.   . polyvinyl alcohol (LIQUIFILM TEARS) 1.4 % ophthalmic solution Place 1 drop into both eyes 2 (two) times daily as needed for dry eyes. Natural Tears  . Rivaroxaban (XARELTO) 15 MG TABS tablet Take 15 mg by mouth every evening.  . rosuvastatin (CRESTOR) 40 MG tablet Take 40 mg by mouth at bedtime.  . Skin Protectants, Misc. (EUCERIN) cream Apply 1 application topically 2 (two) times daily. TO AFFECTED AREAS    FAMILY HISTORY:  His indicated that his  mother is deceased. He indicated that his father is deceased. He indicated that his sister is alive. He indicated that his brother is alive. He indicated that his maternal grandmother is deceased. He indicated that his maternal grandfather is deceased. He indicated that his paternal grandmother is deceased. He indicated that his paternal grandfather is deceased. He indicated that the status of his maternal uncle is unknown. He indicated that the status of his neg hx is unknown.   SOCIAL HISTORY: He  reports that he quit smoking about 6 years ago. His smoking use included cigarettes and pipe. He has a 42.00 pack-year smoking history. He quit smokeless tobacco use about 6 years ago. He reports that he does not drink alcohol or use drugs.  REVIEW OF SYSTEMS:   Unobtainable  SUBJECTIVE:  As above  VITAL SIGNS: BP 108/63   Pulse 95   Temp 99.2 F (37.3 C) (Oral)   Resp (!) 26   Ht 5' (1.524 m)   Wt 77.8 kg (171 lb 8.3 oz)   SpO2 93%   BMI 33.50 kg/m   HEMODYNAMICS:    VENTILATOR SETTINGS: FiO2 (%):  [75 %-100 %] 75 %  INTAKE / OUTPUT: I/O last 3 completed shifts: In: 2766.6 [P.O.:360; I.V.:2056.6; IV Piggyback:350] Out: 205 [Urine:205]  PHYSICAL EXAMINATION:  General: Chronically ill-appearing patient in NAD on NRB face mask.  Neuro: Drowsy, but easily arouses to verbal stimuli Cardiovascular: IRIR, tachy. No MRG.  There is a left carotid endarterectomy  scar.  There is a median sternotomy scar.  The lower extremities are warm without edema but with chronic erythema. Lungs: Respirations are unlabored, bilateral rhonchi, diminished bases.  Abdomen: The abdomen soft and non-tender. There is no obvious masses.  LABS:  BMET Recent Labs  Lab 09/17/17 0436 09/18/17 0446 09/19/17 0328  NA 139 136 133*  K 3.3* 3.6 4.2  CL 96* 94* 93*  CO2 28 26 24   BUN 53* 57* 69*  CREATININE 1.68* 2.54* 3.35*  GLUCOSE 143* 137* 130*    Electrolytes Recent Labs  Lab 09/15/17 1639   09/17/17 0436 09/18/17 0446 09/19/17 0328  CALCIUM  --    < > 9.0 8.5* 8.2*  MG 1.7  --   --   --   --    < > = values in this interval not displayed.    CBC Recent Labs  Lab 09/16/17 0200 09/18/17 0446 09/19/17 0328  WBC 5.4 14.9* 13.7*  HGB 9.7* 9.3* 9.7*  HCT 31.2* 30.2* 30.7*  PLT 270 259 226    Coag's No results for input(s): APTT, INR in the last 168 hours.  Sepsis Markers Recent Labs  Lab 09/17/17 1021 09/18/17 1520 09/18/17 2051 09/19/17 0328  LATICACIDVEN  --  1.2 1.3  --   PROCALCITON 0.16 1.54  --  1.85    ABG Recent Labs  Lab 09/18/17 0840 09/19/17 0124  PHART 7.456* 7.442  PCO2ART 38.7 36.7  PO2ART 118* 106.0    Liver Enzymes No results for input(s): AST, ALT, ALKPHOS, BILITOT, ALBUMIN in the last 168 hours.  Cardiac Enzymes Recent Labs  Lab 09/16/17 1159 09/16/17 1754 09/17/17 0436  TROPONINI 0.14* 0.17* 0.16*    Glucose Recent Labs  Lab 09/17/17 2101 09/18/17 0809 09/18/17 1135 09/18/17 1651 09/18/17 2112 09/19/17 0736  GLUCAP 97 125* 129* 143* 130* 125*    Imaging Ct Head Wo Contrast  Result Date: 09/18/2017 CLINICAL DATA:  Altered level of consciousness. Lethargy. No reported injury. EXAM: CT HEAD WITHOUT CONTRAST TECHNIQUE: Contiguous axial images were obtained from the base of the skull through the vertex without intravenous contrast. COMPARISON:  09/08/2010 head CT. FINDINGS: Brain: There is prominent encephalomalacia in the left parieto-occipital lobe and less prominently in the anterior left frontal lobe. There is associated ex vacuo dilatation in the left lateral ventricle, most prominently in the occipital horn. No evidence of parenchymal hemorrhage or extra-axial fluid collection. No mass lesion, mass effect, or midline shift. No CT evidence of acute infarction. Nonspecific mild subcortical and periventricular white matter hypodensity, most in keeping with chronic small vessel ischemic change. No acute ventriculomegaly.  Vascular: No acute abnormality. Skull: No evidence of calvarial fracture. Sinuses/Orbits: Complete opacification of the right frontal sinus and right maxillary sinus. Near complete opacification of the right greater than left ethmoidal air cells. Mucoperiosteal thickening and partial opacification of the left maxillary sinus. Hyperostosis throughout the right maxillary sinus walls. Apparent lytic destructive changes in the walls of the right ethmoidal air cells with increased left bowing of the nasal septum. Other:  The mastoid air cells are unopacified. IMPRESSION: 1. Progressive severe chronic paranasal sinusitis, most prominent on the right. Apparent lytic destructive changes in the walls of the right ethmoidal air cells with worsening left bowing of the nasal septum. Findings could be due to aggressive sinusitis, with underlying neoplasm not excluded. ENT consultation advised. 2. No evidence of acute intracranial abnormality. 3. 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  4. Mild chronic small vessel ischemic changes in the cerebral white matter. Electronically Signed   By: Ilona Sorrel M.D.   On: 09/18/2017 16:30      ANTIBIOTICS: Tamiflu, aztreonam, and clindamycin  LINES/TUBES: There is a left femoral introducer through which a pacing wire has been passed.  DISCUSSION:      This is a 62 year old diabetic with a history of LV dysfunction secondary to coronary artery disease as well as paroxysmal atrial fibrillation who presented with weakness, A. fib with rapid ventricular response, and intermittent pauses.  He has been found to be influenza A positive.  He has been placed on amiodarone and a temporary pacing wire has been placed.  His respiratory status is deteriorating as is his mental status.  ASSESSMENT / PLAN:  Acute hypoxemic respiratory failure with bilateral pulmonary infiltrates:  Potentially  multifactorial. He is flu A positive, and the incidence of bacterial co-infection is high, so will need to treat for both. Also here with AF-RVR and his rate remains elevated. Known systolic CHF as well. EF 40-45%, which is slightly down from 2017. Infection and volume both likely playing a role. - Continue supplemental oxygen - At risk for intubation.  - Continue Tamiflu, aztreonam, and clindamycin.  - Unable to diurese as volume status unclear and patient in shock.  - Repeat CXR  Shock: etiology unclear. Cardiogenic and septic considered. Lactic acid WNL. Concern he could actually be volume down. He is also on chronic florinef, with a cortisol of 18. May not be supplementing this effectively.  - Levophed to keep MAP > 65 mmHg, hopefully can wean off today.  - Consider stress dosing steroids.  Atrial fibrillation with Tachy/Brady syndrome: Temp pacer in place. Plans for PPM but currently too ill.  - Continue temp pacer for now - Amiodarone infusion - Heparin infusion - EP following  AKI - oliguric - follow renal function - I&O - Continue to hold diuresis. May even need to give some volume, but reluctant with pulmonary infiltrates.   DM - CBG monitoring and SSI  Acute metabolic encephalopathy - Monitor  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 Will need ENT consult once improved.   Georgann Housekeeper, AGACNP-BC [place] [facility]mon[facility]/Critical Care Pager [phone] or [phone]  09/19/2017 7:53 AM  Attending Note:  62 year old male flu A positive with mixed shock picture and lethargy.  Patient is improving from a neuro standpoint but respiratory status remains tenuous at best on exam.  I reviewed CXR myself, infiltrate noted.  Discussed with PCCM-NP.  Will continue pressor support for now.  Supplemental O2.  No BiPAP, if fails 100% NRB then intubate.  Patient is full code at this time.  Continue aztreonam and clinda.  PCCM will continue to follow.  The patient is  critically ill with multiple organ systems failure and requires high complexity decision making for assessment and support, frequent evaluation and titration of therapies, application of advanced monitoring technologies and extensive interpretation of multiple databases.   Critical Care Time devoted to patient care services described in this note is  35  Minutes. This time reflects time of care of this signee Dr [name]. This critical care time does not reflect procedure time, or teaching time or supervisory time of PA/NP/Med student/Med Resident etc but could involve care discussion time.  Rush Farmer, M.D. [facility] Pulmonary/Critical Care Medicine. Pager: [phone]. After hours pager: [phone].

## 2017-09-19 NOTE — Progress Notes (Addendum)
[place] Progress Note Patient Name: [name] DOB: [birth year] MRN: [id number]   Date of Service  09/19/2017  HPI/Events of Note  More lethargic - Patient on NRBM O2. RR = mid 30's. Last blood glucose = 130.  eICU Interventions  Will order: 1. ABG STAT.     Intervention Category Major Interventions: Change in mental status - evaluation and management  Sommer,Steven Eugene 09/19/2017, 1:02 AM

## 2017-09-19 NOTE — Progress Notes (Signed)
Electrophysiology Rounding Note  Patient Name: [name] Date of Encounter: 09/19/2017  Primary Cardiologist: Nahser Electrophysiologist: Lovena Le   Subjective   The patient is ill appearing, on NRB  Inpatient Medications    Scheduled Meds: . atorvastatin  80 mg Oral Daily  . fludrocortisone  0.1 mg Oral Daily  . Influenza vac split quadrivalent PF  0.5 mL Intramuscular Tomorrow-1000  . insulin aspart  0-9 Units Subcutaneous TID WC  . insulin glargine  10 Units Subcutaneous QHS  . ipratropium-albuterol  3 mL Nebulization BID  . mouth rinse  15 mL Mouth Rinse BID  . metoprolol tartrate  25 mg Oral Q8H  . oseltamivir  30 mg Oral Daily  . pantoprazole  40 mg Oral Daily  . PARoxetine  40 mg Oral Daily  . sodium chloride flush  3 mL Intravenous Q12H   Continuous Infusions: . sodium chloride    . amiodarone 30 mg/hr (09/19/17 0800)  . aztreonam Stopped (09/19/17 0320)  . clindamycin (CLEOCIN) IV Stopped (09/19/17 0558)  . heparin 1,200 Units/hr (09/19/17 0800)  . norepinephrine (LEVOPHED) Adult infusion 1.013 mcg/min (09/19/17 0800)   PRN Meds: sodium chloride, acetaminophen, camphor-menthol, guaiFENesin-dextromethorphan, nitroGLYCERIN, ondansetron (ZOFRAN) IV, polyvinyl alcohol, senna-docusate, sodium chloride flush   Vital Signs    Vitals:   09/19/17 0630 09/19/17 0645 09/19/17 0739 09/19/17 0800  BP: 92/62 108/63  (!) 99/57  Pulse: (!) 122 95  (!) 115  Resp: (!) 30 (!) 26  (!) 25  Temp:   99.2 F (37.3 C) 99.2 F (37.3 C)  TempSrc:   Oral Oral  SpO2: 91% 93%  99%  Weight:      Height:        Intake/Output Summary (Last 24 hours) at 09/19/2017 0815 Last data filed at 09/19/2017 0800 Gross per 24 hour  Intake 1868.8 ml  Output 30 ml  Net 1838.8 ml   Filed Weights   09/17/17 0900 09/18/17 0458 09/19/17 0500  Weight: 171 lb 1.2 oz (77.6 kg) 173 lb 1 oz (78.5 kg) 171 lb 8.3 oz (77.8 kg)    Physical Exam    GEN- The patient is elderly and ill  appearing, alert and oriented x 3 today.   Head- normocephalic, atraumatic Eyes-  Sclera clear, conjunctiva pink Ears- hearing intact Oropharynx- clear Neck- supple Lungs- Clear to ausculation bilaterally, normal work of breathing Heart- Tachycardic irregular rate and rhythm  GI- soft, NT, ND, + BS Extremities- no clubbing, cyanosis, or edema, temp wire left groin  Skin- no rash or lesion Psych- euthymic mood, full affect Neuro- strength and sensation are intact  Labs    CBC Recent Labs    09/18/17 0446 09/19/17 0328  WBC 14.9* 13.7*  HGB 9.3* 9.7*  HCT 30.2* 30.7*  MCV 85.3 84.6  PLT 259 989   Basic Metabolic Panel Recent Labs    09/18/17 0446 09/19/17 0328  NA 136 133*  K 3.6 4.2  CL 94* 93*  CO2 26 24  GLUCOSE 137* 130*  BUN 57* 69*  CREATININE 2.54* 3.35*  CALCIUM 8.5* 8.2*  Cardiac Enzymes Recent Labs    09/16/17 1159 09/16/17 1754 09/17/17 0436  TROPONINI 0.14* 0.17* 0.16*     Telemetry    AF, intermittent V pacing  (personally reviewed)  Radiology    Ct Head Wo Contrast  Result Date: 09/18/2017 CLINICAL DATA:  Altered level of consciousness. Lethargy. No reported injury. EXAM: CT HEAD WITHOUT CONTRAST TECHNIQUE: Contiguous axial images were obtained from the base of  the skull through the vertex without intravenous contrast. COMPARISON:  09/08/2010 head CT. FINDINGS: Brain: There is prominent encephalomalacia in the left parieto-occipital lobe and less prominently in the anterior left frontal lobe. There is associated ex vacuo dilatation in the left lateral ventricle, most prominently in the occipital horn. No evidence of parenchymal hemorrhage or extra-axial fluid collection. No mass lesion, mass effect, or midline shift. No CT evidence of acute infarction. Nonspecific mild subcortical and periventricular white matter hypodensity, most in keeping with chronic small vessel ischemic change. No acute ventriculomegaly. Vascular: No acute abnormality.  Skull: No evidence of calvarial fracture. Sinuses/Orbits: Complete opacification of the right frontal sinus and right maxillary sinus. Near complete opacification of the right greater than left ethmoidal air cells. Mucoperiosteal thickening and partial opacification of the left maxillary sinus. Hyperostosis throughout the right maxillary sinus walls. Apparent lytic destructive changes in the walls of the right ethmoidal air cells with increased left bowing of the nasal septum. Other:  The mastoid air cells are unopacified. IMPRESSION: 1. Progressive severe chronic paranasal sinusitis, most prominent on the right. Apparent lytic destructive changes in the walls of the right ethmoidal air cells with worsening left bowing of the nasal septum. Findings could be due to aggressive sinusitis, with underlying neoplasm not excluded. ENT consultation advised. 2. No evidence of acute intracranial abnormality. 3. 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 4. Mild chronic small vessel ischemic changes in the cerebral white matter. Electronically Signed   By: Ilona Sorrel M.D.   On: 09/18/2017 16:30   Dg Chest Port 1 View  Result Date: 09/18/2017 CLINICAL DATA:  Acute onset of tachypnea and dyspnea. Increased oxygen requirement. EXAM: PORTABLE CHEST 1 VIEW COMPARISON:  Chest radiograph performed 09/17/2016 FINDINGS: Right basilar and left perihilar airspace opacification may reflect pneumonia or pulmonary edema. No pleural effusion or pneumothorax is seen. The cardiomediastinal silhouette is borderline normal in size. The patient is status post median sternotomy. No acute osseous abnormalities are identified. IMPRESSION: Right basilar and left perihilar airspace opacification may reflect pneumonia or pulmonary edema. Electronically Signed   By: Garald Balding M.D.   On: 09/18/2017 03:12     Assessment & Plan    1.  Atrial fibrillation  with tachy/brady syndrome Continue IV amio Will need pacemaker, but currently too ill Temp wire in place, left groin Continue Heparin for now - transition to eliquis post pacemaker CHADS2VASC is at least 4  2.  Influenza A Per primary  3.  Respiratory distress/?PNA Per primary team  4.  AKI Renal function worsened this morning Per primary team    Signed, Chanetta Marshall, NP  09/19/2017, 8:15 AM   EP Attending  Patient seen and examined. Agree with above. See my note as well.   Mikle Bosworth.D.

## 2017-09-19 NOTE — Progress Notes (Signed)
PCCM Interval Note  Called to bedside by RN for concern over ongoing lethargy and need for intubation.  ABG shows 7.44/36.7/106/25 on NRB with no significant change than prior ABG in 2/11 am except that he is requiring more O2 support, previously on HFNC at 10L now on NRB.  On assessment, patient is requiring less vasopressor support, easily awakens to verbal, oriented x 3, denies feeling more distressed or tired, does not appear to be in acute distress with mild tachypnea.  Patient will easily desaturate if he removes his mask.  Still has strong wet cough with diffuse rales.    At this point, I do not feel like he needs intubation at this point and is able to maintain his airway; however he remains at very high risk of deterioration with developing ARDS.  Will continue to monitor.    Kennieth Rad, AGACNP-BC [place] [facility] Pgr: [phone] or if no answer [phone] 09/19/2017, 2:11 AM

## 2017-09-19 NOTE — Progress Notes (Signed)
CPT held at this time due to patients vitals being unstable at this time.

## 2017-09-19 NOTE — Procedures (Signed)
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 Ardis Hughs  Findings: Severe phimosis of the foreskin preventing retraction.  The phimosis of foreskin by spreading open within the narrow opening using a pair of scissors.  The urethral meatus was also dilated gently using  the tip of a small pair of scissors.  A 16 French Foley catheter was then placed atraumatically.  Specimen: None  Drains: 71F foley  Indications: The patient was intubated and sedated and required Foley catheter for urine output monitoring.  Procedure: I pulled up the patient's foreskin and placed a small pair of scissors and the narrow opening.  I then gently spread the scissors breaking the fibrotic band.  This was repeated several times allowing better visualization of the urethral meatus.  After a failed attempt to cannulate the urethral meatus I then used the tip of the scissors and gently spread and the urethral meatus creating a small opening big enough for a 16 French catheter.  Gentials were prepped and draped in the routine sterile fashion.  I injected petroleum jelly  into the patient's urethra.  A 71F catheter was then gently passed in to the urethral.  Mild resistance was met at the prostate, but with gentle pressue the catheter slid through the prostate and into the bladder.  Clear yellow urine was returned.  Patient tolerated the procedure well - no immediate issues.  Disposition: Remove catheter per primary team.

## 2017-09-19 NOTE — Procedures (Signed)
Central Venous Catheter Insertion Procedure Note [name] [id number] Jun 27, 1956  Procedure: Insertion of Central Venous Catheter Indications: Assessment of intravascular volume and Drug and/or fluid administration  Procedure Details Consent: Risks of procedure as well as the alternatives and risks of each were explained to the (patient/caregiver).  Consent for procedure obtained. Time Out: Verified patient identification, verified procedure, site/side was marked, verified correct patient position, special equipment/implants available, medications/allergies/relevent history reviewed, required imaging and test results available.  Performed  Maximum sterile technique was used including antiseptics, cap, gloves, gown, hand hygiene, mask and sheet. Skin prep: Chlorhexidine; local anesthetic administered A antimicrobial bonded/coated triple lumen catheter was placed in the left internal jugular vein using the Seldinger technique.  Evaluation Blood flow good Complications: No apparent complications Patient did tolerate procedure well. Chest X-ray ordered to verify placement.  CXR: pending.  U/S used in placement  YACOUB,WESAM 09/19/2017, 3:46 PM

## 2017-09-19 NOTE — Progress Notes (Signed)
[place] Progress Note Patient Name: [name] DOB: 05/25/1956 MRN: [id number]   Date of Service  09/19/2017  HPI/Events of Note  AFIB with RVR - Ventricular rate = 101 to 140.   eICU Interventions  Will bolus with Amiodarone 150 mg IV over 10 minutes now.      Intervention Category Major Interventions: Arrhythmia - evaluation and management  Janaiah Vetrano Eugene 09/19/2017, 5:30 AM

## 2017-09-19 NOTE — Progress Notes (Signed)
Progress Note  Patient Name: [name] Date of Encounter: 09/19/2017  Primary Cardiologist:  Dr. Acie Fredrickson  Subjective   C/o feeling tired. No chest pain.   Inpatient Medications    Scheduled Meds: . atorvastatin  80 mg Oral Daily  . fludrocortisone  0.1 mg Oral Daily  . Influenza vac split quadrivalent PF  0.5 mL Intramuscular Tomorrow-1000  . insulin aspart  0-9 Units Subcutaneous TID WC  . insulin glargine  10 Units Subcutaneous QHS  . ipratropium-albuterol  3 mL Nebulization BID  . mouth rinse  15 mL Mouth Rinse BID  . metoprolol tartrate  25 mg Oral Q8H  . oseltamivir  30 mg Oral Daily  . pantoprazole  40 mg Oral Daily  . PARoxetine  40 mg Oral Daily  . sodium chloride flush  3 mL Intravenous Q12H   Continuous Infusions: . sodium chloride    . amiodarone 30 mg/hr (09/19/17 0800)  . aztreonam Stopped (09/19/17 0320)  . clindamycin (CLEOCIN) IV Stopped (09/19/17 0558)  . heparin 1,200 Units/hr (09/19/17 0800)  . norepinephrine (LEVOPHED) Adult infusion 1.013 mcg/min (09/19/17 0800)   PRN Meds: sodium chloride, acetaminophen, camphor-menthol, guaiFENesin-dextromethorphan, nitroGLYCERIN, ondansetron (ZOFRAN) IV, polyvinyl alcohol, senna-docusate, sodium chloride flush   Vital Signs    Vitals:   09/19/17 0630 09/19/17 0645 09/19/17 0739 09/19/17 0800  BP: 92/62 108/63  (!) 99/57  Pulse: (!) 122 95  (!) 115  Resp: (!) 30 (!) 26  (!) 25  Temp:   99.2 F (37.3 C) 99.2 F (37.3 C)  TempSrc:   Oral Oral  SpO2: 91% 93%  99%  Weight:      Height:        Intake/Output Summary (Last 24 hours) at 09/19/2017 0814 Last data filed at 09/19/2017 0800 Gross per 24 hour  Intake 1868.8 ml  Output 30 ml  Net 1838.8 ml   Filed Weights   09/17/17 0900 09/18/17 0458 09/19/17 0500  Weight: 171 lb 1.2 oz (77.6 kg) 173 lb 1 oz (78.5 kg) 171 lb 8.3 oz (77.8 kg)    Telemetry    Atrial fib with pauses and back up pacing - Personally Reviewed  ECG    none - Personally  Reviewed  Physical Exam   GEN: No acute distress.   Neck: No JVD Cardiac: IRIRR, no murmurs, rubs, or gallops.  Respiratory: Clear to auscultation bilaterally. GI: Soft, nontender, non-distended  MS: No edema; No deformity. Neuro:  Nonfocal  Psych: Normal affect   Labs    Chemistry Recent Labs  Lab 09/17/17 0436 09/18/17 0446 09/19/17 0328  NA 139 136 133*  K 3.3* 3.6 4.2  CL 96* 94* 93*  CO2 28 26 24   GLUCOSE 143* 137* 130*  BUN 53* 57* 69*  CREATININE 1.68* 2.54* 3.35*  CALCIUM 9.0 8.5* 8.2*  GFRNONAA 42* 25* 18*  GFRAA 48* 29* 21*  ANIONGAP 15 16* 16*     Hematology Recent Labs  Lab 09/16/17 0200 09/18/17 0446 09/19/17 0328  WBC 5.4 14.9* 13.7*  RBC 3.67* 3.54* 3.63*  HGB 9.7* 9.3* 9.7*  HCT 31.2* 30.2* 30.7*  MCV 85.0 85.3 84.6  MCH 26.4 26.3 26.7  MCHC 31.1 30.8 31.6  RDW 14.6 14.9 15.2  PLT 270 259 226    Cardiac Enzymes Recent Labs  Lab 09/15/17 2103 09/16/17 1159 09/16/17 1754 09/17/17 0436  TROPONINI 0.12* 0.14* 0.17* 0.16*    Recent Labs  Lab 09/15/17 0935  TROPIPOC 0.03     BNPNo results for  input(s): BNP, PROBNP in the last 168 hours.   DDimer No results for input(s): DDIMER in the last 168 hours.   Radiology    Ct Head Wo Contrast  Result Date: 09/18/2017 CLINICAL DATA:  Altered level of consciousness. Lethargy. No reported injury. EXAM: CT HEAD WITHOUT CONTRAST TECHNIQUE: Contiguous axial images were obtained from the base of the skull through the vertex without intravenous contrast. COMPARISON:  09/08/2010 head CT. FINDINGS: Brain: There is prominent encephalomalacia in the left parieto-occipital lobe and less prominently in the anterior left frontal lobe. There is associated ex vacuo dilatation in the left lateral ventricle, most prominently in the occipital horn. No evidence of parenchymal hemorrhage or extra-axial fluid collection. No mass lesion, mass effect, or midline shift. No CT evidence of acute infarction. Nonspecific  mild subcortical and periventricular white matter hypodensity, most in keeping with chronic small vessel ischemic change. No acute ventriculomegaly. Vascular: No acute abnormality. Skull: No evidence of calvarial fracture. Sinuses/Orbits: Complete opacification of the right frontal sinus and right maxillary sinus. Near complete opacification of the right greater than left ethmoidal air cells. Mucoperiosteal thickening and partial opacification of the left maxillary sinus. Hyperostosis throughout the right maxillary sinus walls. Apparent lytic destructive changes in the walls of the right ethmoidal air cells with increased left bowing of the nasal septum. Other:  The mastoid air cells are unopacified. IMPRESSION: 1. Progressive severe chronic paranasal sinusitis, most prominent on the right. Apparent lytic destructive changes in the walls of the right ethmoidal air cells with worsening left bowing of the nasal septum. Findings could be due to aggressive sinusitis, with underlying neoplasm not excluded. ENT consultation advised. 2. No evidence of acute intracranial abnormality. 3. 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 4. Mild chronic small vessel ischemic changes in the cerebral white matter. Electronically Signed   By: Ilona Sorrel M.D.   On: 09/18/2017 16:30   Dg Chest Port 1 View  Result Date: 09/18/2017 CLINICAL DATA:  Acute onset of tachypnea and dyspnea. Increased oxygen requirement. EXAM: PORTABLE CHEST 1 VIEW COMPARISON:  Chest radiograph performed 09/17/2016 FINDINGS: Right basilar and left perihilar airspace opacification may reflect pneumonia or pulmonary edema. No pleural effusion or pneumothorax is seen. The cardiomediastinal silhouette is borderline normal in size. The patient is status post median sternotomy. No acute osseous abnormalities are identified. IMPRESSION: Right basilar and left perihilar airspace  opacification may reflect pneumonia or pulmonary edema. Electronically Signed   By: Garald Balding M.D.   On: 09/18/2017 03:12    Cardiac Studies   none  Patient Profile     62 y.o. male admitted with influenza and probable pneumonia and noted to have atrial fib with long pauses, s/p temp PPM  Assessment & Plan    1. Symptomatic bradycardia - he is s/p temp PM insertion. We would like to get this out as soon as possible but his multiple acute medical problems make him not a candidate for PPM.  2. Respiratory failure - he is requiring a non-rebreather to maintain oxygen saturation.  3. Hypotension - he is still hypotensive despite levophed. Suspect influenza with superimposed pneumonia/sepsis. He is on anti-biotic therapy. For questions or updates, please contact Mabel Please consult [email or web address] for contact info under Cardiology/STEMI.      Signed, Cristopher Peru, MD  09/19/2017, 8:14 AM  Patient ID: [name], male   DOB: [birth year], 62 y.o.   MRN: [id number]

## 2017-09-19 NOTE — Progress Notes (Signed)
[place] Progress Note Patient Name: [name] DOB: 1956/08/03 MRN: [id number]   Date of Service  09/19/2017  HPI/Events of Note  ABG = 7.44/36.7/106.0/25. Bedside nurse remains very concerned about this patient.   eICU Interventions  Will ask ground team to evaluate patient at bedside.      Intervention Category Major Interventions: Change in mental status - evaluation and management  Sommer,Steven Eugene 09/19/2017, 1:37 AM

## 2017-09-19 NOTE — Progress Notes (Signed)
Bagged patient with a PEEP valve attached at 15 of PEEP, at this time due to SATs down to the low 70's. Patient back up to upper 90's after about 2 minutes of bagging. Placed on ventilator at this time. Will continue to monitor.

## 2017-09-19 NOTE — Progress Notes (Signed)
[place] for Heparin  Indication: atrial fibrillation  Allergies  Allergen Reactions  . Penicillins Hives, Nausea And Vomiting, Swelling and Other (See Comments)    Has patient had a PCN reaction causing immediate rash, facial/tongue/throat swelling, SOB or lightheadedness with hypotension: YES Has patient had a PCN reaction causing severe rash involving mucus membranes or skin necrosis: No Has patient had a PCN reaction that required hospitalization No Has patient had a PCN reaction occurring within the last 10 years: No If all of the above answers are "NO", then may proceed with Cephalosporin use.     Patient Measurements: Height: 5' (152.4 cm) Weight: 173 lb 1 oz (78.5 kg) IBW/kg (Calculated) : 50   Vital Signs: Temp: 98.4 F (36.9 C) (02/12 0300) Temp Source: Axillary (02/12 0300) BP: 102/48 (02/12 0400) Pulse Rate: 47 (02/12 0400)  Labs: Recent Labs    09/16/17 1159 09/16/17 1754 09/17/17 0436 09/17/17 1332 09/18/17 0446 09/19/17 0328  HGB  --   --   --   --  9.3* 9.7*  HCT  --   --   --   --  30.2* 30.7*  PLT  --   --   --   --  259 226  HEPARINUNFRC  --   --   --  0.32 0.36 0.20*  CREATININE  --   --  1.68*  --  2.54* 3.35*  TROPONINI 0.14* 0.17* 0.16*  --   --   --     Assessment: 61yom admitted with flu.  Hx Afib previously in anticoagulation but stopped d/t GIB in 2015.  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  Plan to anticoagulate with heparin in meantime and apixaban post PPM.  -heparin level is SUBtherapeutic  -cbc wnl  Goal of Therapy:  Heparin level 0.3-0.7 units/ml Monitor platelets by anticoagulation protocol: Yes   Plan:  Increase heparin to 1200 units/hr Check 8 hour level Daily HL, CBC   Jaeven Wanzer, Jake Church 09/19/2017 4:20 AM

## 2017-09-19 NOTE — Procedures (Signed)
Arterial Catheter Insertion Procedure Note [name] [id number] 06-10-1956  Procedure: Insertion of Arterial Catheter  Indications: Blood pressure monitoring and Frequent blood sampling  Procedure Details Consent: Risks of procedure as well as the alternatives and risks of each were explained to the (patient/caregiver).  Consent for procedure obtained. Time Out: Verified patient identification, verified procedure, site/side was marked, verified correct patient position, special equipment/implants available, medications/allergies/relevent history reviewed, required imaging and test results available.  Performed  Maximum sterile technique was used including antiseptics, cap, gloves, gown, hand hygiene, mask and sheet. Skin prep: Chlorhexidine; local anesthetic administered 20 gauge catheter was inserted into left brachial artery using the Seldinger technique.  Evaluation Blood flow good; BP tracing good. Complications: No apparent complications.   [name] 09/19/2017

## 2017-09-19 NOTE — Progress Notes (Signed)
[place] for Heparin  Indication: atrial fibrillation  Allergies  Allergen Reactions  . Penicillins Hives, Nausea And Vomiting, Swelling and Other (See Comments)    Has patient had a PCN reaction causing immediate rash, facial/tongue/throat swelling, SOB or lightheadedness with hypotension: YES Has patient had a PCN reaction causing severe rash involving mucus membranes or skin necrosis: No Has patient had a PCN reaction that required hospitalization No Has patient had a PCN reaction occurring within the last 10 years: No If all of the above answers are "NO", then may proceed with Cephalosporin use.     Patient Measurements: Height: 5' (152.4 cm) Weight: 171 lb 8.3 oz (77.8 kg) IBW/kg (Calculated) : 50   Vital Signs: Temp: 100.1 F (37.8 C) (02/12 1204) Temp Source: Oral (02/12 1204) BP: 99/71 (02/12 1200) Pulse Rate: 117 (02/12 1200)  Labs: Recent Labs    09/16/17 1754 09/17/17 0436  09/18/17 0446 09/19/17 0328 09/19/17 1141  HGB  --   --   --  9.3* 9.7*  --   HCT  --   --   --  30.2* 30.7*  --   PLT  --   --   --  259 226  --   HEPARINUNFRC  --   --    < > 0.36 0.20* 0.25*  CREATININE  --  1.68*  --  2.54* 3.35*  --   TROPONINI 0.17* 0.16*  --   --   --   --    < > = values in this interval not displayed.    Assessment: 61yom admitted with flu.  Hx Afib previously in anticoagulation but stopped d/t GIB in 2015.  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  Plan to anticoagulate with heparin in meantime and apixaban post PPM.  -heparin level remains below goal on 1200 units/hr -cbc wnl  Goal of Therapy:  Heparin level 0.3-0.7 units/ml Monitor platelets by anticoagulation protocol: Yes   Plan:  Increase heparin to 1350 units/hr Daily HL, CBC  Hildred Laser, Pharm D 09/19/2017 12:49 PM

## 2017-09-19 NOTE — Progress Notes (Signed)
Bagged patient with a PEEP valve attached at 15 of PEEP, at this time due to SATs down to the 60's. Patient back up to 100% after about 4 minutes of bagging. Placed back on ventilator. RN at bedside. Will continue to monitor.

## 2017-09-19 NOTE — Progress Notes (Addendum)
PCCM INTERVAL PROGRESS NOTE  Re-rounded on patient this afternoon. Remains borderline hypoxic on Non-rebreather. Respiratory effort seems to be dwindling. Remains in AF RVR. Wonder how much of this is mediated by hypoxia.   Plan:  Intubate Full vent support.  VAP bundle Fentanyl infusion and PRN versed for sedation. RASS goal -1 to -2.    Georgann Housekeeper, AGACNP-BC [place] [facility]/Critical Care Pager [phone] or [phone]  09/19/2017 3:00 PM  Attending Note:  Patient's respiratory status stagnant at this point.  We are unable to diurese given hypotension, treating infection.  Nothing quickly reversible and patient states he is getting tired.  On exam, diffuse crackles noted.  I reviewed CXR myself, worsening infiltrate noted.  Reviewed with pharmacy.  Patient completed course of tamiflu.  Will continue antibacterial.  Hold diureses for now.  Intubate.  Place TLC.  Check CVP.  Adjust vent for ABG and PCCM will continue to follow.  [place] team unable to place catheter.  Will call urology for foley placement.  The patient is critically ill with multiple organ systems failure and requires high complexity decision making for assessment and support, frequent evaluation and titration of therapies, application of advanced monitoring technologies and extensive interpretation of multiple databases.   Critical Care Time devoted to patient care services described in this note is  60  Minutes. This time reflects time of care of this signee Dr [name]. This critical care time does not reflect procedure time, or teaching time or supervisory time of PA/NP/Med student/Med Resident etc but could involve care discussion time.  Rush Farmer, M.D. [facility] Pulmonary/Critical Care Medicine. Pager: [phone]. After hours pager: [phone].

## 2017-09-19 NOTE — Procedures (Signed)
OGT Placement By MD  Under direct laryngoscopy, OGT placed and verified by auscultation  Rush Farmer, M.D. [facility] Pulmonary/Critical Care Medicine. Pager: [phone]. After hours pager: [phone].

## 2017-09-20 ENCOUNTER — Inpatient Hospital Stay (HOSPITAL_COMMUNITY): Payer: Medicare (Managed Care)

## 2017-09-20 DIAGNOSIS — E1165 Type 2 diabetes mellitus with hyperglycemia: Secondary | ICD-10-CM

## 2017-09-20 LAB — CULTURE, BLOOD (ROUTINE X 2)
Culture: NO GROWTH
Culture: NO GROWTH
Special Requests: ADEQUATE
Special Requests: ADEQUATE

## 2017-09-20 LAB — HEPARIN LEVEL (UNFRACTIONATED): Heparin Unfractionated: 0.33 IU/mL (ref 0.30–0.70)

## 2017-09-20 LAB — BASIC METABOLIC PANEL
ANION GAP: 17 — AB (ref 5–15)
BUN: 78 mg/dL — ABNORMAL HIGH (ref 6–20)
CALCIUM: 8 mg/dL — AB (ref 8.9–10.3)
CHLORIDE: 91 mmol/L — AB (ref 101–111)
CO2: 20 mmol/L — AB (ref 22–32)
Creatinine, Ser: 4.44 mg/dL — ABNORMAL HIGH (ref 0.61–1.24)
GFR calc non Af Amer: 13 mL/min — ABNORMAL LOW (ref >60)
GFR, EST AFRICAN AMERICAN: 15 mL/min — AB (ref >60)
Glucose, Bld: 217 mg/dL — ABNORMAL HIGH (ref 65–99)
Potassium: 3.7 mmol/L (ref 3.5–5.1)
Sodium: 128 mmol/L — ABNORMAL LOW (ref 135–145)

## 2017-09-20 LAB — GLUCOSE, CAPILLARY
GLUCOSE-CAPILLARY: 196 mg/dL — AB (ref 65–99)
GLUCOSE-CAPILLARY: 154 mg/dL — AB (ref 65–99)
GLUCOSE-CAPILLARY: 155 mg/dL — AB (ref 65–99)
Glucose-Capillary: 200 mg/dL — ABNORMAL HIGH (ref 65–99)
Glucose-Capillary: 152 mg/dL — ABNORMAL HIGH (ref 65–99)

## 2017-09-20 LAB — CBC
HEMATOCRIT: 28.9 % — AB (ref 39.0–52.0)
HEMOGLOBIN: 9.3 g/dL — AB (ref 13.0–17.0)
MCH: 26.6 pg (ref 26.0–34.0)
MCHC: 32.2 g/dL (ref 30.0–36.0)
MCV: 82.6 fL (ref 78.0–100.0)
Platelets: 325 10*3/uL (ref 150–400)
RBC: 3.5 MIL/uL — AB (ref 4.22–5.81)
RDW: 15.1 % (ref 11.5–15.5)
WBC: 23.2 10*3/uL — AB (ref 4.0–10.5)

## 2017-09-20 LAB — PROCALCITONIN: PROCALCITONIN: 12.09 ng/mL

## 2017-09-20 LAB — POCT I-STAT 3, ART BLOOD GAS (G3+)
ACID-BASE DEFICIT: 6 mmol/L — AB (ref 0.0–2.0)
Bicarbonate: 19.5 mmol/L — ABNORMAL LOW (ref 20.0–28.0)
O2 Saturation: 98 %
PH ART: 7.341 — AB (ref 7.350–7.450)
PO2 ART: 108 mmHg (ref 83.0–108.0)
TCO2: 21 mmol/L — ABNORMAL LOW (ref 22–32)
pCO2 arterial: 36 mmHg (ref 32.0–48.0)

## 2017-09-20 MED ORDER — SODIUM CHLORIDE 0.9 % IV BOLUS (SEPSIS)
500.0000 mL | Freq: Once | INTRAVENOUS | Status: AC
  Administered 2017-09-20: 500 mL via INTRAVENOUS

## 2017-09-20 MED ORDER — ATORVASTATIN CALCIUM 80 MG PO TABS
80.0000 mg | ORAL_TABLET | Freq: Every day | ORAL | Status: DC
  Administered 2017-09-20 – 2017-09-28 (×9): 80 mg
  Filled 2017-09-20 (×8): qty 1

## 2017-09-20 MED ORDER — FLUDROCORTISONE ACETATE 0.1 MG PO TABS
0.1000 mg | ORAL_TABLET | Freq: Every day | ORAL | Status: DC
  Administered 2017-09-20: 0.1 mg
  Filled 2017-09-20 (×2): qty 1

## 2017-09-20 MED ORDER — OSELTAMIVIR PHOSPHATE 75 MG PO CAPS
75.0000 mg | ORAL_CAPSULE | Freq: Two times a day (BID) | ORAL | Status: DC
  Administered 2017-09-20: 75 mg via ORAL
  Filled 2017-09-20: qty 1

## 2017-09-20 NOTE — Consult Note (Signed)
[facility] Consult Note     Date: 09/20/2017                  Patient Name:  [name]  MRN: [id number]  DOB: 1955-11-25  Age / Sex: 62 y.o., male         PCP: Janifer Adie, MD                 Service Requesting Consult: PCCM                 Reason for Consult: Uremia and decreased UOP            Chief Complaint: generalized weakness  HPI:  [name] is a 62 y.o. male with PMH significant for afib, CHF (Last Echo 11/16/15 EF40-50%,  CAD (s/p CABG 2017) , Carotid stenosis (S/P L Carotid Endarterectomy), CVA, COPD, HTN, Abdominal Hernia, and Diabetes presenting with generalized weakness found to have afib with RVR and sinus pauses (s/p temporary pacing wire placement due to 7-8 second pauses).  He became lethargic and hypotensive and was started on pressors.  Infectious workup revealed to be positive for influenza A.  His clinical course continued to deteriorate and he was subsequently intubated and transferred to the ICU and had escalation in the pressor support due to severe sepsis syndrome.  We were consulted due to the development of worsening oliguric, AKI/CKD stage 3 (baseline Scr appears to be 1.6-1.7).  His Scr has risen from 1.7 on admission to 4.44 today.  His UOP was poor initially and required Urology to place a Foley catheter due to phimosis of his foreskin.  His UOP peaked at over 1 liter on 09/17/16 and has continued to decline to only 55 ml as of today.     Family at bedside (brother, niece) state "[name]" lives with his sister [name]. Prior to admission, he was able to grocery shop and cook on his own, but would not have had the exercise tolerance to mow the lawn. They state [name] is the decision maker. [name] had been doing well prior to arrival until he had flu like symptoms.   Past Medical History:  Diagnosis Date  . Abdominal hernia    Chronic, not a good surgical candidate  . Abscess, abdomen 12/31/2010   Referred to Wound Care in 01/2011  because of multiple abd abscess with VERY large ventral hernia (please look at image of CT abd/pelvis 09/2010).  Because of hernia I was hesitant to I&D.      Marland Kitchen Anemia    History of Iron Def Anemia  . Anxiety   . AVM (arteriovenous malformation)    chronic GI blood loss  . Carotid artery disease ([place])    s/p CEA  . Chronic diastolic CHF (congestive heart failure) (HCC)    takes Lasix  . Chronic low back pain   . COPD (chronic obstructive pulmonary disease) ([place])   . CVA (cerebral infarction) 09/2010   Bilateral with Left > Right  . Diabetes mellitus   . GERD (gastroesophageal reflux disease)   . History of nuclear stress test    Myoview 9/16:  Inferior, apical and inf-lateral ischemia; not gated; High Risk  . Hx of echocardiogram    Echo 5/16:  Mild LVH, EF 55%, indeterm. diast function, WMA could not be ruled out, MAC, trivial MR, mild LAE, normal RVF //  b. Echo 4/17: EF 55-60%, no RWMA, Gr 2 DD, Ao sclerosis, MAC, mild MS, mild LAE, PASP  55 mHg  . Hyperlipidemia   . Hypertension   . Intellectual disability    Sister helps to take care of him and takes him to appts  . Itching    all over body; pt scratches and has sores on bilateral arms and abdomen  . Lung nodule   . Myocardial infarction [facility]) 2016 ?   Heart attack  (  Per  pt. )  . Obesity   . Oxygen dependent    wears 2 liters at bedtime and when needed  . PAF (paroxysmal atrial fibrillation) ([place]) 06/2009   CHADS score 2 (HTN, DM) // Pradaxa for Afib // Pradaxa stopped due to worsening anemia (Hgb in 5/18 8.5 >> Pradaxa remains on hold)   . Pneumonia    hx of  . Stroke ([place])   . Tobacco user    Smokes 1ppd for multiple years.  Quit after hosp 09/2010.  . Tubular adenoma of colon   . Wears glasses     Past Surgical History:  Procedure Laterality Date  . AMPUTATION Right 03/29/2013   Procedure: AMPUTATION RAY;  Surgeon: Newt Minion, MD;  Location: [place];  Service: Orthopedics;  Laterality: Right;  Right Foot 3rd and  Possible 4th Ray Amputation  . AMPUTATION Right 04/23/2013   Procedure: AMPUTATION RIGHT MID-FOOT;  Surgeon: Newt Minion, MD;  Location: [place];  Service: Orthopedics;  Laterality: Right;  . arm surgery Left    as a child  . CARDIAC CATHETERIZATION N/A 04/30/2015   Procedure: Left Heart Cath and Coronary Angiography;  Surgeon: Belva Crome, MD;  Location: [place] [facility];  Service: Cardiovascular;  Laterality: N/A;  . Carotid arteriogram  10/2010   30% right ICA stenosis, 40% left ICA stenosis   . CAROTID ENDARTERECTOMY Left 08-13-15   CEA  . CATARACT EXTRACTION, BILATERAL    . COLONOSCOPY WITH PROPOFOL N/A 06/10/2014   Procedure: COLONOSCOPY WITH PROPOFOL;  Surgeon: Jerene Bears, MD;  [facility];  Service: Gastroenterology;  Laterality: N/A;  . CORONARY ARTERY BYPASS GRAFT N/A 08/13/2015   Procedure: CORONARY ARTERY BYPASS GRAFTING (CABG), ON PUMP, TIMES THREE, USING LEFT INTERNAL MAMMARY ARTERY, RIGHT GREATER SAPHENOUS VEIN HARVESTED ENDOSCOPICALLY;  Surgeon: [name], MD;  Location: [place];  Service: Open Heart Surgery;  Laterality: N/A;  . DEBRIDMENT OF DECUBITUS ULCER Right 02/13/2013  . ENDARTERECTOMY Left 08/13/2015   Procedure: ENDARTERECTOMY CAROTID;  Surgeon: Angelia Mould, MD;  Location: [place];  Service: Vascular;  Laterality: Left;  . ESOPHAGOGASTRODUODENOSCOPY (EGD) WITH PROPOFOL N/A 06/10/2014   Procedure: ESOPHAGOGASTRODUODENOSCOPY (EGD) WITH PROPOFOL;  Surgeon: Jerene Bears, MD;  [facility];  Service: Gastroenterology;  Laterality: N/A;  . GIVENS CAPSULE STUDY N/A 07/09/2014   Procedure: GIVENS CAPSULE STUDY;  Surgeon: Jerene Bears, MD;  [facility];  Service: Gastroenterology;  Laterality: N/A;  . I&D EXTREMITY Right 02/13/2013   Procedure: IRRIGATION AND DEBRIDEMENT FOOT ULCER;  Surgeon: Johnny Bridge, MD;  Location: [place];  Service: Orthopedics;  Laterality: Right;  PULSE LAVAGE  . MULTIPLE TOOTH EXTRACTIONS    . TEE WITHOUT  CARDIOVERSION N/A 08/13/2015   Procedure: TRANSESOPHAGEAL ECHOCARDIOGRAM (TEE);  Surgeon: [name], MD;  Location: [place];  Service: Open Heart Surgery;  Laterality: N/A;  . TEMPORARY PACEMAKER N/A 09/15/2017   Procedure: TEMPORARY PACEMAKER;  Surgeon: Jettie Booze, MD;  Location: [place] [facility];  Service: Cardiovascular;  Laterality: N/A;  . TRANSESOPHAGEAL ECHOCARDIOGRAM  09/2010   No  ASD or PFO. EF 60-65%.  Normal systolic function. No evidence of thrombus.   . TRANSTHORACIC ECHOCARDIOGRAM  09/2010    The cavity size was normal. Systolic function was vigorous.  EF 65-70%.  Normal wall funciton.   Marland Kitchen ULTRASOUND GUIDANCE FOR VASCULAR ACCESS  09/15/2017   Procedure: Ultrasound Guidance For Vascular Access;  Surgeon: Jettie Booze, MD;  Location: [place] [facility];  Service: Cardiovascular;;    Family History  Problem Relation Age of Onset  . Heart disease Mother   . Hypertension Sister   . Heart disease Brother   . Heart disease Father   . Peripheral vascular disease Father   . Heart disease Maternal Grandmother   . Heart attack Maternal Grandmother   . Breast cancer Maternal Grandmother   . Stomach cancer Maternal Uncle   . Colon cancer Neg Hx   . Stroke Neg Hx    Social History:  reports that he quit smoking about 6 years ago. His smoking use included cigarettes and pipe. He has a 42.00 pack-year smoking history. He quit smokeless tobacco use about 6 years ago. He reports that he does not drink alcohol or use drugs.  Allergies:  Allergies  Allergen Reactions  . Penicillins Hives, Nausea And Vomiting, Swelling and Other (See Comments)    Has patient had a PCN reaction causing immediate rash, facial/tongue/throat swelling, SOB or lightheadedness with hypotension: YES Has patient had a PCN reaction causing severe rash involving mucus membranes or skin necrosis: No Has patient had a PCN reaction that required hospitalization No Has patient had a PCN reaction occurring  within the last 10 years: No If all of the above answers are "NO", then may proceed with Cephalosporin use.     Medications Prior to Admission  Medication Sig Dispense Refill  . acetaminophen (TYLENOL) 325 MG tablet Take 650 mg by mouth 3 (three) times daily as needed for mild pain.    Marland Kitchen amLODipine (NORVASC) 5 MG tablet Take 5 mg by mouth daily.    Marland Kitchen aspirin EC 81 MG tablet Take 81 mg by mouth daily.    . bisoprolol (ZEBETA) 5 MG tablet Take 5 mg by mouth daily.    . calcium carbonate (TUMS - DOSED IN MG ELEMENTAL CALCIUM) 500 MG chewable tablet Chew 1 tablet by mouth 3 (three) times daily as needed for indigestion or heartburn.    . Cholecalciferol (VITAMIN D3) 1000 UNITS CAPS Take 1,000 Units by mouth daily.    . clonazePAM (KLONOPIN) 0.5 MG tablet Take 1 tablet (0.5 mg total) by mouth at bedtime. 30 tablet 0  . furosemide (LASIX) 80 MG tablet Take 1.5 tablets (120 mg total) by mouth 2 (two) times daily. (Patient taking differently: Take 80-120 mg by mouth See admin instructions. 120 mg by mouth in the morning and 80 mg at bedtime)    . gabapentin (NEURONTIN) 600 MG tablet Take 600 mg by mouth 2 (two) times daily.    Marland Kitchen HYDROcodone-acetaminophen (NORCO) 5-325 MG tablet Take 1 tablet by mouth daily.    . Insulin Glargine (LANTUS) 100 UNIT/ML Solostar Pen Inject 10 Units into the skin every evening.    Marland Kitchen ipratropium-albuterol (DUONEB) 0.5-2.5 (3) MG/3ML SOLN Take 3 mLs by nebulization 2 (two) times daily.    . metFORMIN (GLUCOPHAGE) 1000 MG tablet Take 1,000 mg by mouth 2 (two) times daily with a meal.    . metolazone (ZAROXOLYN) 2.5 MG tablet Take 2.5 mg by mouth every Monday, Wednesday, and Friday.    Marland Kitchen  nitroGLYCERIN (NITROSTAT) 0.4 MG SL tablet Place 0.4 mg under the tongue every 5 (five) minutes as needed for chest pain.    . OXYGEN Inhale 2 L into the lungs as needed (to supplement).    . pantoprazole (PROTONIX) 40 MG tablet Take 40 mg by mouth daily.    Marland Kitchen PARoxetine (PAXIL) 40 MG tablet  Take 40 mg by mouth daily.     . polyvinyl alcohol (LIQUIFILM TEARS) 1.4 % ophthalmic solution Place 1 drop into both eyes 2 (two) times daily as needed for dry eyes. Natural Tears    . Rivaroxaban (XARELTO) 15 MG TABS tablet Take 15 mg by mouth every evening.    . rosuvastatin (CRESTOR) 40 MG tablet Take 40 mg by mouth at bedtime.    . Skin Protectants, Misc. (EUCERIN) cream Apply 1 application topically 2 (two) times daily. TO AFFECTED AREAS      Results for orders placed or performed during the hospital encounter of 09/15/17 (from the past 48 hour(s))  Cortisol     Status: None   Collection Time: 09/18/17  3:20 PM  Result Value Ref Range   Cortisol, Plasma 18.1 ug/dL    Comment: (NOTE) AM    6.7 - 22.6 ug/dL PM   <10.0       ug/dL Performed at [facility] [address], [place], [place] [address]   Procalcitonin - Baseline     Status: None   Collection Time: 09/18/17  3:20 PM  Result Value Ref Range   Procalcitonin 1.54 ng/mL    Comment:        Interpretation: PCT > 0.5 ng/mL and <= 2 ng/mL: Systemic infection (sepsis) is possible, but other conditions are known to elevate PCT as well. (NOTE)       Sepsis PCT Algorithm           Lower Respiratory Tract                                      Infection PCT Algorithm    ----------------------------     ----------------------------         PCT < 0.25 ng/mL                PCT < 0.10 ng/mL         Strongly encourage             Strongly discourage   discontinuation of antibiotics    initiation of antibiotics    ----------------------------     -----------------------------       PCT 0.25 - 0.50 ng/mL            PCT 0.10 - 0.25 ng/mL               OR       >80% decrease in PCT            Discourage initiation of                                            antibiotics      Encourage discontinuation           of antibiotics    ----------------------------     -----------------------------         PCT >= 0.50 ng/mL  PCT 0.26 - 0.50 ng/mL                AND       <80% decrease in PCT             Encourage initiation of                                             antibiotics       Encourage continuation           of antibiotics    ----------------------------     -----------------------------        PCT >= 0.50 ng/mL                  PCT > 0.50 ng/mL               AND         increase in PCT                  Strongly encourage                                      initiation of antibiotics    Strongly encourage escalation           of antibiotics                                     -----------------------------                                           PCT <= 0.25 ng/mL                                                 OR                                        > 80% decrease in PCT                                     Discontinue / Do not initiate                                             antibiotics Performed at [facility], [address], [place], [place] [address]   Lactic acid, plasma     Status: None   Collection Time: 09/18/17  3:20 PM  Result Value Ref Range   Lactic Acid, Venous 1.2 0.5 - 1.9 mmol/L    Comment: Performed at [facility] [address], [place], [place] [address]  Glucose, capillary     Status: Abnormal   Collection Time: 09/18/17  4:51 PM  Result  Value Ref Range   Glucose-Capillary 143 (H) 65 - 99 mg/dL   Comment 1 Capillary Specimen    Comment 2 Notify RN   Lactic acid, plasma     Status: None   Collection Time: 09/18/17  8:51 PM  Result Value Ref Range   Lactic Acid, Venous 1.3 0.5 - 1.9 mmol/L    Comment: Performed at [facility] [address], [place], [place] [address]  Glucose, capillary     Status: Abnormal   Collection Time: 09/18/17  9:12 PM  Result Value Ref Range   Glucose-Capillary 130 (H) 65 - 99 mg/dL   Comment 1 Capillary Specimen    Comment 2 Notify RN   I-STAT 3, arterial blood gas (G3+)     Status: None   Collection  Time: 09/19/17  1:24 AM  Result Value Ref Range   pH, Arterial 7.442 7.350 - 7.450   pCO2 arterial 36.7 32.0 - 48.0 mmHg   pO2, Arterial 106.0 83.0 - 108.0 mmHg   Bicarbonate 25.2 20.0 - 28.0 mmol/L   TCO2 26 22 - 32 mmol/L   O2 Saturation 98.0 %   Acid-Base Excess 1.0 0.0 - 2.0 mmol/L   Patient temperature 97.7 F    Collection site RADIAL, ALLEN'S TEST ACCEPTABLE    Drawn by RT    Sample type ARTERIAL   Heparin level (unfractionated)     Status: Abnormal   Collection Time: 09/19/17  3:28 AM  Result Value Ref Range   Heparin Unfractionated 0.20 (L) 0.30 - 0.70 IU/mL    Comment:        IF HEPARIN RESULTS ARE BELOW EXPECTED VALUES, AND PATIENT DOSAGE HAS BEEN CONFIRMED, SUGGEST FOLLOW UP TESTING OF ANTITHROMBIN III LEVELS. Performed at [facility], [place] [address], [place], [place] [address]   Procalcitonin     Status: None   Collection Time: 09/19/17  3:28 AM  Result Value Ref Range   Procalcitonin 1.85 ng/mL    Comment:        Interpretation: PCT > 0.5 ng/mL and <= 2 ng/mL: Systemic infection (sepsis) is possible, but other conditions are known to elevate PCT as well. (NOTE)       Sepsis PCT Algorithm           Lower Respiratory Tract                                      Infection PCT Algorithm    ----------------------------     ----------------------------         PCT < 0.25 ng/mL                PCT < 0.10 ng/mL         Strongly encourage             Strongly discourage   discontinuation of antibiotics    initiation of antibiotics    ----------------------------     -----------------------------       PCT 0.25 - 0.50 ng/mL            PCT 0.10 - 0.25 ng/mL               OR       >80% decrease in PCT            Discourage initiation of  antibiotics      Encourage discontinuation           of antibiotics    ----------------------------     -----------------------------         PCT >= 0.50 ng/mL              PCT 0.26 -  0.50 ng/mL                AND       <80% decrease in PCT             Encourage initiation of                                             antibiotics       Encourage continuation           of antibiotics    ----------------------------     -----------------------------        PCT >= 0.50 ng/mL                  PCT > 0.50 ng/mL               AND         increase in PCT                  Strongly encourage                                      initiation of antibiotics    Strongly encourage escalation           of antibiotics                                     -----------------------------                                           PCT <= 0.25 ng/mL                                                 OR                                        > 80% decrease in PCT                                     Discontinue / Do not initiate                                             antibiotics Performed at [facility], [place] [address], [place], [place] [address]   CBC     Status: Abnormal   Collection Time: 09/19/17  3:28 AM  Result Value Ref Range   WBC 13.7 (H) 4.0 - 10.5 K/uL   RBC 3.63 (L) 4.22 - 5.81 MIL/uL   Hemoglobin 9.7 (L) 13.0 - 17.0 g/dL   HCT 30.7 (L) 39.0 - 52.0 %   MCV 84.6 78.0 - 100.0 fL   MCH 26.7 26.0 - 34.0 pg   MCHC 31.6 30.0 - 36.0 g/dL   RDW 15.2 11.5 - 15.5 %   Platelets 226 150 - 400 K/uL    Comment: Performed at [facility] [address] [place], [place] [address]  Basic metabolic panel     Status: Abnormal   Collection Time: 09/19/17  3:28 AM  Result Value Ref Range   Sodium 133 (L) 135 - 145 mmol/L   Potassium 4.2 3.5 - 5.1 mmol/L   Chloride 93 (L) 101 - 111 mmol/L   CO2 24 22 - 32 mmol/L   Glucose, Bld 130 (H) 65 - 99 mg/dL   BUN 69 (H) 6 - 20 mg/dL   Creatinine, Ser 3.35 (H) 0.61 - 1.24 mg/dL   Calcium 8.2 (L) 8.9 - 10.3 mg/dL   GFR calc non Af Amer 18 (L) >60 mL/min   GFR calc Af Amer 21 (L) >60 mL/min    Comment: (NOTE) The eGFR has been  calculated using the CKD EPI equation. This calculation has not been validated in all clinical situations. eGFR's persistently <60 mL/min signify possible Chronic Kidney Disease.    Anion gap 16 (H) 5 - 15    Comment: Performed at [facility], [place] [address], [place], [place] [address]  Glucose, capillary     Status: Abnormal   Collection Time: 09/19/17  7:36 AM  Result Value Ref Range   Glucose-Capillary 125 (H) 65 - 99 mg/dL   Comment 1 Capillary Specimen    Comment 2 Notify RN   Heparin level (unfractionated)     Status: Abnormal   Collection Time: 09/19/17 11:41 AM  Result Value Ref Range   Heparin Unfractionated 0.25 (L) 0.30 - 0.70 IU/mL    Comment:        IF HEPARIN RESULTS ARE BELOW EXPECTED VALUES, AND PATIENT DOSAGE HAS BEEN CONFIRMED, SUGGEST FOLLOW UP TESTING OF ANTITHROMBIN III LEVELS. Performed at [facility], [place] [address], [place], [place] [address]   Glucose, capillary     Status: Abnormal   Collection Time: 09/19/17 12:02 PM  Result Value Ref Range   Glucose-Capillary 128 (H) 65 - 99 mg/dL   Comment 1 Capillary Specimen    Comment 2 Notify RN   I-STAT 3, arterial blood gas (G3+)     Status: Abnormal   Collection Time: 09/19/17  4:30 PM  Result Value Ref Range   pH, Arterial 7.381 7.350 - 7.450   pCO2 arterial 33.9 32.0 - 48.0 mmHg   pO2, Arterial 63.0 (L) 83.0 - 108.0 mmHg   Bicarbonate 19.9 (L) 20.0 - 28.0 mmol/L   TCO2 21 (L) 22 - 32 mmol/L   O2 Saturation 90.0 %   Acid-base deficit 4.0 (H) 0.0 - 2.0 mmol/L   Patient temperature 100.1 F    Collection site ARTERIAL LINE    Drawn by Operator    Sample type ARTERIAL   Glucose, capillary     Status: Abnormal   Collection Time: 09/19/17 10:15 PM  Result Value Ref Range   Glucose-Capillary 198 (H) 65 - 99 mg/dL   Comment 1 Arterial Specimen   Heparin level (unfractionated)  Status: None   Collection Time: 09/20/17  3:45 AM  Result Value Ref Range   Heparin Unfractionated 0.33 0.30 -  0.70 IU/mL    Comment:        IF HEPARIN RESULTS ARE BELOW EXPECTED VALUES, AND PATIENT DOSAGE HAS BEEN CONFIRMED, SUGGEST FOLLOW UP TESTING OF ANTITHROMBIN III LEVELS. Performed at [facility], [place] [address], [place], [place] [address]   Procalcitonin     Status: None   Collection Time: 09/20/17  3:45 AM  Result Value Ref Range   Procalcitonin 12.09 ng/mL    Comment:        Interpretation: PCT >= 10 ng/mL: Important systemic inflammatory response, almost exclusively due to severe bacterial sepsis or septic shock. (NOTE)       Sepsis PCT Algorithm           Lower Respiratory Tract                                      Infection PCT Algorithm    ----------------------------     ----------------------------         PCT < 0.25 ng/mL                PCT < 0.10 ng/mL         Strongly encourage             Strongly discourage   discontinuation of antibiotics    initiation of antibiotics    ----------------------------     -----------------------------       PCT 0.25 - 0.50 ng/mL            PCT 0.10 - 0.25 ng/mL               OR       >80% decrease in PCT            Discourage initiation of                                            antibiotics      Encourage discontinuation           of antibiotics    ----------------------------     -----------------------------         PCT >= 0.50 ng/mL              PCT 0.26 - 0.50 ng/mL                AND       <80% decrease in PCT             Encourage initiation of                                             antibiotics       Encourage continuation           of antibiotics    ----------------------------     -----------------------------        PCT >= 0.50 ng/mL                  PCT > 0.50 ng/mL  AND         increase in PCT                  Strongly encourage                                      initiation of antibiotics    Strongly encourage escalation           of antibiotics                                      -----------------------------                                           PCT <= 0.25 ng/mL                                                 OR                                        > 80% decrease in PCT                                     Discontinue / Do not initiate                                             antibiotics Performed at [facility], [address] [place], [place] [address]   CBC     Status: Abnormal   Collection Time: 09/20/17  3:45 AM  Result Value Ref Range   WBC 23.2 (H) 4.0 - 10.5 K/uL   RBC 3.50 (L) 4.22 - 5.81 MIL/uL   Hemoglobin 9.3 (L) 13.0 - 17.0 g/dL   HCT 28.9 (L) 39.0 - 52.0 %   MCV 82.6 78.0 - 100.0 fL   MCH 26.6 26.0 - 34.0 pg   MCHC 32.2 30.0 - 36.0 g/dL   RDW 15.1 11.5 - 15.5 %   Platelets 325 150 - 400 K/uL    Comment: Performed at [facility], [place] [address] [place], [place] [address]  Basic metabolic panel     Status: Abnormal   Collection Time: 09/20/17  3:45 AM  Result Value Ref Range   Sodium 128 (L) 135 - 145 mmol/L   Potassium 3.7 3.5 - 5.1 mmol/L   Chloride 91 (L) 101 - 111 mmol/L   CO2 20 (L) 22 - 32 mmol/L   Glucose, Bld 217 (H) 65 - 99 mg/dL   BUN 78 (H) 6 - 20 mg/dL   Creatinine, Ser 4.44 (H) 0.61 - 1.24 mg/dL   Calcium 8.0 (L) 8.9 - 10.3 mg/dL   GFR calc non Af Amer 13 (L) >60 mL/min   GFR calc Af Amer 15 (L) >60 mL/min  Comment: (NOTE) The eGFR has been calculated using the CKD EPI equation. This calculation has not been validated in all clinical situations. eGFR's persistently <60 mL/min signify possible Chronic Kidney Disease.    Anion gap 17 (H) 5 - 15    Comment: Performed at [facility], [place] [address] [place], [place] [address]  Glucose, capillary     Status: Abnormal   Collection Time: 09/20/17  3:57 AM  Result Value Ref Range   Glucose-Capillary 200 (H) 65 - 99 mg/dL   Comment 1 Arterial Specimen   Glucose, capillary     Status: Abnormal   Collection Time: 09/20/17  7:29 AM  Result Value  Ref Range   Glucose-Capillary 196 (H) 65 - 99 mg/dL   Comment 1 Notify RN   Glucose, capillary     Status: Abnormal   Collection Time: 09/20/17 11:12 AM  Result Value Ref Range   Glucose-Capillary 154 (H) 65 - 99 mg/dL   Comment 1 Notify RN   I-STAT 3, arterial blood gas (G3+)     Status: Abnormal   Collection Time: 09/20/17 11:59 AM  Result Value Ref Range   pH, Arterial 7.341 (L) 7.350 - 7.450   pCO2 arterial 36.0 32.0 - 48.0 mmHg   pO2, Arterial 108.0 83.0 - 108.0 mmHg   Bicarbonate 19.5 (L) 20.0 - 28.0 mmol/L   TCO2 21 (L) 22 - 32 mmol/L   O2 Saturation 98.0 %   Acid-base deficit 6.0 (H) 0.0 - 2.0 mmol/L   Patient temperature 98.9 F    Collection site ARTERIAL LINE    Sample type ARTERIAL    Ct Head Wo Contrast  Result Date: 09/18/2017 CLINICAL DATA:  Altered level of consciousness. Lethargy. No reported injury. EXAM: CT HEAD WITHOUT CONTRAST TECHNIQUE: Contiguous axial images were obtained from the base of the skull through the vertex without intravenous contrast. COMPARISON:  09/08/2010 head CT. FINDINGS: Brain: There is prominent encephalomalacia in the left parieto-occipital lobe and less prominently in the anterior left frontal lobe. There is associated ex vacuo dilatation in the left lateral ventricle, most prominently in the occipital horn. No evidence of parenchymal hemorrhage or extra-axial fluid collection. No mass lesion, mass effect, or midline shift. No CT evidence of acute infarction. Nonspecific mild subcortical and periventricular white matter hypodensity, most in keeping with chronic small vessel ischemic change. No acute ventriculomegaly. Vascular: No acute abnormality. Skull: No evidence of calvarial fracture. Sinuses/Orbits: Complete opacification of the right frontal sinus and right maxillary sinus. Near complete opacification of the right greater than left ethmoidal air cells. Mucoperiosteal thickening and partial opacification of the left maxillary sinus.  Hyperostosis throughout the right maxillary sinus walls. Apparent lytic destructive changes in the walls of the right ethmoidal air cells with increased left bowing of the nasal septum. Other:  The mastoid air cells are unopacified. IMPRESSION: 1. Progressive severe chronic paranasal sinusitis, most prominent on the right. Apparent lytic destructive changes in the walls of the right ethmoidal air cells with worsening left bowing of the nasal septum. Findings could be due to aggressive sinusitis, with underlying neoplasm not excluded. ENT consultation advised. 2. No evidence of acute intracranial abnormality. 3. 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 4. Mild chronic small vessel ischemic changes in the cerebral white matter. Electronically Signed   By: Ilona Sorrel M.D.   On: 09/18/2017 16:30   Dg Chest Port 1 View  Result Date: 09/20/2017 CLINICAL DATA:  Hypoxia EXAM: PORTABLE CHEST 1 VIEW COMPARISON:  September 19, 2017 FINDINGS: Endotracheal tube tip is 2.8 cm above the carina. Central catheter tip is in the superior vena cava. Nasogastric tube tip and side port are below the diaphragm. There is a pacemaker lead attached to the right ventricle, placed from below the diaphragm. No pneumothorax. There is atelectatic change in both lung bases. Lungs elsewhere are clear. Heart is upper normal in size with pulmonary vascularity within normal limits. There is aortic atherosclerosis. No adenopathy. No bone lesions. IMPRESSION: Tube and catheter positions as described without evident pneumothorax. Bibasilar atelectasis. Lungs elsewhere clear. Stable cardiac silhouette. There is aortic atherosclerosis. Aortic Atherosclerosis (ICD10-I70.0). Electronically Signed   By: Lowella Grip III M.D.   On: 09/20/2017 08:06   Dg Chest Port 1 View  Result Date: 09/19/2017 CLINICAL DATA:  Respiratory failure. Central venous catheter  placement. EXAM: PORTABLE CHEST 1 VIEW COMPARISON:  09/19/2017 at 1507 hours FINDINGS: New left IJ approach central line catheter crosses midline and terminates in the mid SVC. No pneumothorax is noted. Heart is borderline enlarged but stable. Aortic atherosclerosis at the arch is identified. Patient is status post median sternotomy. Endotracheal and gastric tubes are in satisfactory position with endotracheal tube tip 3.5 cm above the carina. Gastric tube extends below the left hemidiaphragm. Right femoral pacing wire tip noted over the right ventricle. Post CABG change are redemonstrated. Bibasilar atelectasis and/or pneumonia appears stable. No overt pulmonary edema. Trace right effusion is stable. IMPRESSION: 1. Satisfactory support line and tube positions. New left IJ approach IJ central line catheter terminates in the mid SVC. No pneumothorax. 2. Stable bibasilar atelectasis and/or infiltrates with trace right pleural effusion. 3. Aortic atherosclerosis without aneurysm. Electronically Signed   By: Ashley Royalty M.D.   On: 09/19/2017 17:48   Dg Chest Port 1 View  Result Date: 09/19/2017 CLINICAL DATA:  Intubation. EXAM: PORTABLE CHEST 1 VIEW COMPARISON:  09/18/2017. FINDINGS: Endotracheal tube tip noted approximately 3 cm above the carina. NG tube noted with tip below left hemidiaphragm. Right femoral pacer wire tip noted over the right ventricle. Prior CABG. Heart size stable. Persistent but improved bibasilar infiltrates. Small right pleural effusion. No pneumothorax. IMPRESSION: 1. Endotracheal tube and NG tube in good anatomic position. Right femoral pacer wire tip noted over the right ventricle. 2. Persistent but improved bibasilar infiltrates. Small right pleural effusion. 3.  Prior CABG.  Stable heart size.  No pulmonary venous congestion. Electronically Signed   By: Marcello Moores  Register   On: 09/19/2017 15:19    Review of Systems  Unable to perform ROS: Intubated    Blood pressure 126/71, pulse  87, temperature 98.9 F (37.2 C), temperature source Axillary, resp. rate (!) 28, height 5' (1.524 m), weight 172 lb 13.5 oz (78.4 kg), SpO2 96 %. Physical Exam  Constitutional: No distress.  HENT:  Head: Normocephalic and atraumatic.  Neck: Neck supple.  Cardiovascular:  Paced rhythm, no murmur  Respiratory:  Intubated, good air exchange.   GI: Soft. Bowel sounds are normal.  Musculoskeletal: He exhibits no edema.  Scabs over bilateral shins.  Neurological:  Sedated and intubated.  Skin: Skin is warm and dry.     Assessment/Plan Acute kidney injury: baseline Cr appears to have been 0.88-1.12 in early 2018 but over the last year has ranged 1.6-1.7. Cr on presentation 1.69 >>2.54 >> 4.44. Etiology likely combination of prerenal and ischemic ATN (sepsis with hypotension requiring pressors, Cr began to increase more rapidly after requiring pressors), but could  have contributing intrinsic dysfunction (ATN 2/2 hypotension vs ATN 2/2 vancomycin). Nephrotoxic agents during this hospital stay include gabapentin 65m BID, vancomycin. Last diuretic 2/11, UOP minimal > 540cc 2/12 > minimal 2/13. Unable to assess uremia secondary to sedation, BUN continues to increase to 78.  -CCM to place HD cath in am -CRRT for oliguric renal failure pending HD access  Shock: possibly septic 2/2 bacterial superinfection s/p flu vs hypovolemia.  -pressors per CCM (ECHO with EF of 40-45% and has ongoing bradycardia without temporary pacemaker)  Hyponatremia: ?SIADH in the setting of critical illness.  -monitor daily   Symptomatic bradycardia - EP following and he is currently not a candidate for PPM due to his multiorgan failure and ongoing infections.    DM- per primary team Afib with RVR per cards.  Acute hypoxic respiratory failure per CCM.   KRalene Ok MD 09/20/17  I have seen and examined this patient and agree with plan and assessment in the above note with renal recommendations/intervention  highlighted.  Mr. MRufenerhas had progressive, oliguric AKI/CKD stage 3 due to sepsis and ongoing pressor requirements.  I discussed the case with Drs. TLindell Noeand YKearny  No urgent indication for dialysis at this time, however he will likely need to initiate CVVHD tomorrow if there is no significant improvement.  Will continue to follow closely and have asked PCCM to place trialysis catheter in preparation for CRRT needs.  JBroadus JohnA Makinna Andy,MD 09/20/2017 5:03 PM

## 2017-09-20 NOTE — Progress Notes (Signed)
Events of yesterday noted Pt in AF with RVR, no further significant pacing. Continue amiodarone  EP will see again tomorrow No new recs today from a rhythm standpoint  Chanetta Marshall, NP 09/20/2017 7:10 AM  EP Attending  Patient seen and examined. Clinical course reviewed. He has required mechanical ventilation, escalating doses of pressors and has uncontrolled atrial fib despite amiodarone. Overall prognosis is seems poor. He has had no HR slowing. I suspect levophed and shock/massive catecholamine surge leading to rapid heart rates.  Mikle Bosworth.D.

## 2017-09-20 NOTE — Progress Notes (Signed)
0730: patient assessed 0740: EP @bedside - no changes  0800: CCM @bedside  0820: 574mL NS bolus given per CCM 1130: Dr. Nelda Marseille @bedside  speaking with family about dialysis possibilities   -dialysis catheter consent completed 1200: patient reassessed- no changes unless otherwise charted 1400: nephrology @bedside  1600: patient reassessed- no changes unless otherwise charted 1800: pt temp 101.5- tylenol given

## 2017-09-20 NOTE — Progress Notes (Signed)
[place] for Heparin  Indication: atrial fibrillation  Allergies  Allergen Reactions  . Penicillins Hives, Nausea And Vomiting, Swelling and Other (See Comments)    Has patient had a PCN reaction causing immediate rash, facial/tongue/throat swelling, SOB or lightheadedness with hypotension: YES Has patient had a PCN reaction causing severe rash involving mucus membranes or skin necrosis: No Has patient had a PCN reaction that required hospitalization No Has patient had a PCN reaction occurring within the last 10 years: No If all of the above answers are "NO", then may proceed with Cephalosporin use.     Patient Measurements: Height: 5' (152.4 cm) Weight: 172 lb 13.5 oz (78.4 kg) IBW/kg (Calculated) : 50   Vital Signs: Temp: 100.6 F (38.1 C) (02/13 0800) Temp Source: Oral (02/13 0800) BP: 127/74 (02/13 0800) Pulse Rate: 84 (02/13 0800)  Labs: Recent Labs    09/18/17 0446 09/19/17 0328 09/19/17 1141 09/20/17 0345  HGB 9.3* 9.7*  --  9.3*  HCT 30.2* 30.7*  --  28.9*  PLT 259 226  --  325  HEPARINUNFRC 0.36 0.20* 0.25* 0.33  CREATININE 2.54* 3.35*  --  4.44*    Assessment: 61yom admitted with flu.  Hx Afib previously in anticoagulation but stopped d/t GIB in 2015.  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  Plan to anticoagulate with heparin in meantime and apixaban post PPM. Of note he is now on a ventilator.  -heparin level is at goal Goal of Therapy:  Heparin level 0.3-0.7 units/ml Monitor platelets by anticoagulation protocol: Yes   Plan:  Increase heparin to 1350 units/hr Daily HL, CBC  Hildred Laser, Pharm D 09/20/2017 8:19 AM

## 2017-09-20 NOTE — Progress Notes (Signed)
PULMONARY / CRITICAL CARE MEDICINE   Name: [name] MRN: [id number] DOB: Oct 03, 1955    ADMISSION DATE:  09/15/2017 CONSULTATION DATE: 09/18/2017  REFERRING MD: Neva Seat  Reason for consult: hypotension and worsening aA gradient  HISTORY OF PRESENT ILLNESS:        This is a 62 year old diabetic with a history of coronary disease and congestive heart failure with a baseline ejection fraction of 40-50%.  He was admitted on 2/8 complaining of generalized weakness and was found to be in atrial fibrillation with a rapid ventricular response with intermittent 7-8-second pauses.  A temporary pacing wire was placed.  He was noted to have infiltrates or CHF on his chest x-ray.  PCR for influenza a was positive.  He was started on a combination of Tamiflu as well as clindamycin and aztreonam due to a penicillin allergy.  He was diuresed but diuretics are currently being held as his creatinine has risen from approximately 0.9-2.5.  I was called today for hypotension and increasing oxygen requirements.  On my arrival the patient's mental status is very poor.  He is extremely lethargic and not giving any significant history.  He has had a nonproductive cough.  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   SUBJECTIVE:  Intubated 2/12.  Remains in AF RVR this morning, on amio.   VITAL SIGNS: BP 111/80   Pulse (!) 115   Temp (!) 100.6 F (38.1 C) (Oral)   Resp (!) 28   Ht 5' (1.524 m)   Wt 78.4 kg (172 lb 13.5 oz)   SpO2 100%   BMI 33.76 kg/m   HEMODYNAMICS: CVP:  [5 mmHg-14 mmHg] 6 mmHg  VENTILATOR SETTINGS: Vent Mode: PRVC FiO2 (%):  [70 %-100 %] 70 % Set Rate:  [28 bmp] 28 bmp Vt Set:  [400 mL] 400 mL PEEP:  [8 cmH20-16 cmH20] 14 cmH20 Plateau Pressure:  [28 cmH20-32 cmH20] 28 cmH20  INTAKE / OUTPUT: I/O last 3 completed shifts: In: 3098 [I.V.:2548; IV Piggyback:550] Out: 1140 [Urine:540; Emesis/NG  output:600]  PHYSICAL EXAMINATION:   General: Chronically ill-appearing patient, on vent Neuro: Sedated, opens eyes to noxious stimuli but does not follow commands Cardiovascular: IRIR, tachy. No MRG.The lower extremities are warm without edema but with chronic erythema. Lungs: On vent, respirations unlabored.  CTAB. Abdomen: The abdomen soft and non-tender. There are no obvious masses. Extremities:  Warm.  Right transmetatarsal amputation.  LABS:  BMET Recent Labs  Lab 09/18/17 0446 09/19/17 0328 09/20/17 0345  NA 136 133* 128*  K 3.6 4.2 3.7  CL 94* 93* 91*  CO2 26 24 20*  BUN 57* 69* 78*  CREATININE 2.54* 3.35* 4.44*  GLUCOSE 137* 130* 217*    Electrolytes Recent Labs  Lab 09/15/17 1639  09/18/17 0446 09/19/17 0328 09/20/17 0345  CALCIUM  --    < > 8.5* 8.2* 8.0*  MG 1.7  --   --   --   --    < > = values in this interval not displayed.    CBC Recent Labs  Lab 09/18/17 0446 09/19/17 0328 09/20/17 0345  WBC 14.9* 13.7* 23.2*  HGB 9.3* 9.7* 9.3*  HCT 30.2* 30.7* 28.9*  PLT 259 226 325    Coag's No results for input(s): APTT, INR in the last 168 hours.  Sepsis Markers Recent Labs  Lab 09/18/17 1520 09/18/17 2051 09/19/17 0328 09/20/17 0345  LATICACIDVEN 1.2 1.3  --   --  PROCALCITON 1.54  --  1.85 12.09    ABG Recent Labs  Lab 09/18/17 0840 09/19/17 0124 09/19/17 1630  PHART 7.456* 7.442 7.381  PCO2ART 38.7 36.7 33.9  PO2ART 118* 106.0 63.0*    Liver Enzymes No results for input(s): AST, ALT, ALKPHOS, BILITOT, ALBUMIN in the last 168 hours.  Cardiac Enzymes Recent Labs  Lab 09/16/17 1159 09/16/17 1754 09/17/17 0436  TROPONINI 0.14* 0.17* 0.16*    Glucose Recent Labs  Lab 09/18/17 2112 09/19/17 0736 09/19/17 1202 09/19/17 2215 09/20/17 0357 09/20/17 0729  GLUCAP 130* 125* 128* 198* 200* 196*    Imaging Dg Chest Port 1 View  Result Date: 09/19/2017 CLINICAL DATA:  Respiratory failure. Central venous catheter  placement. EXAM: PORTABLE CHEST 1 VIEW COMPARISON:  09/19/2017 at 1507 hours FINDINGS: New left IJ approach central line catheter crosses midline and terminates in the mid SVC. No pneumothorax is noted. Heart is borderline enlarged but stable. Aortic atherosclerosis at the arch is identified. Patient is status post median sternotomy. Endotracheal and gastric tubes are in satisfactory position with endotracheal tube tip 3.5 cm above the carina. Gastric tube extends below the left hemidiaphragm. Right femoral pacing wire tip noted over the right ventricle. Post CABG change are redemonstrated. Bibasilar atelectasis and/or pneumonia appears stable. No overt pulmonary edema. Trace right effusion is stable. IMPRESSION: 1. Satisfactory support line and tube positions. New left IJ approach IJ central line catheter terminates in the mid SVC. No pneumothorax. 2. Stable bibasilar atelectasis and/or infiltrates with trace right pleural effusion. 3. Aortic atherosclerosis without aneurysm. Electronically Signed   By: Ashley Royalty M.D.   On: 09/19/2017 17:48   Dg Chest Port 1 View  Result Date: 09/19/2017 CLINICAL DATA:  Intubation. EXAM: PORTABLE CHEST 1 VIEW COMPARISON:  09/18/2017. FINDINGS: Endotracheal tube tip noted approximately 3 cm above the carina. NG tube noted with tip below left hemidiaphragm. Right femoral pacer wire tip noted over the right ventricle. Prior CABG. Heart size stable. Persistent but improved bibasilar infiltrates. Small right pleural effusion. No pneumothorax. IMPRESSION: 1. Endotracheal tube and NG tube in good anatomic position. Right femoral pacer wire tip noted over the right ventricle. 2. Persistent but improved bibasilar infiltrates. Small right pleural effusion. 3.  Prior CABG.  Stable heart size.  No pulmonary venous congestion. Electronically Signed   By: Marcello Moores  Register   On: 09/19/2017 15:19      ANTIBIOTICS: Tamiflu 2/12 >  Cefepime 2/12 >  Linezolid 2/12 >  Clinda 2/11 >  2/12   LINES/TUBES: There is a left femoral introducer through which a pacing wire has been passed. ETT 2/12 >  L brachial art line 2/12 >  L IJ CVL 2/12 >    DISCUSSION:      This is a 62 year old diabetic with a history of LV dysfunction secondary to coronary artery disease as well as paroxysmal atrial fibrillation who presented with weakness, A. fib with rapid ventricular response, and intermittent pauses.  He has been found to be influenza A positive.  He has been placed on amiodarone and a temporary pacing wire has been placed. Respiratory status deteriorated 2/12; therefore, he required intubation.   ASSESSMENT / PLAN:  Acute hypoxemic respiratory failure with bilateral pulmonary infiltrates:  Potentially multifactorial. He is flu A positive and the incidence of bacterial co-infection is high, so will need to treat for both. Also here with AF-RVR and his rate remains elevated. Known systolic CHF as well. EF 40-45%, which is slightly down from 2017. Infection and  volume both likely playing a role.  He is s/p intubation 2/12 after declining mental status. - Continue full vent support - Bronchial hygiene - Daily SBT - holding today given higher than normal O2 requirements - Continue Tamiflu, zyvox, cefepime - Holding diuresis given shock state and CVP 9 - Follow CXR  Shock: etiology unclear. Likely combo cardiogenic, septic (PCT 12), hypovolemia (CVP 6 - 9). Lactic acid WNL. Concern he could actually be volume down. He is also on chronic florinef, with a cortisol of 18. May not be supplementing this effectively.  - Levophed to keep MAP > 65 mmHg, hopefully can wean off today - 500cc NS bolus now given CVP 9 - Continue florinef - Consider stress dosing steroids if no improvement - d/c metoprolol  Atrial fibrillation with Tachy/Brady syndrome: Temp pacer in place. Plans for PPM but currently too ill.  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 Will need ENT consult once improved.   CC time: 30 min.   [name], [place] [facility] Pager: [phone]  or [phone] 09/20/2017, 8:11 AM  Attending Note:  62 year old male with extensive PMH who presents to the hospital with Flu A and respiratory failure.  Patient failed 100% NRB and was intubated.  This AM, diffuse crackles on exam.  I reviewed CXR myself, ETT is ok and infiltrate noted.  Renal failure this AM with oliguria.  Spoke with family, ok with dialysis but after discussion agreed to no CPR/cardioversion.  Will continue abx for now and consulted nephrology for ?dialysis.  PCCM will continue to follow.  Hold off weaning for today.  The patient is critically ill with multiple organ systems failure and requires high complexity decision making for assessment and support, frequent evaluation and titration of therapies, application of advanced monitoring technologies and extensive interpretation of multiple databases.   Critical Care Time devoted to patient care services described in this note is  35  Minutes. This time reflects time of care of this signee Dr [name]. This critical care time does not reflect procedure time, or teaching time or supervisory time of PA/NP/Med student/Med Resident etc but could involve care discussion time.  Rush Farmer, M.D. [facility] Care Medicine. Pager: [phone]. After hours pager: [phone].

## 2017-09-20 NOTE — Care Management Note (Addendum)
Case Management Note  Patient Details  Name: MAXFIELD GILDERSLEEVE MRN: [id number] Date of Birth: [birth year]  Subjective/Objective:   From home with sister, [name] [phone] pta was able to do grocery shopping and some cooking.  He has a walker , cane and hospital bed at home.  He goes to [facility] Mon, Tue, and Wed.   Presents with tachy brady syndrom with 6 -10 second pauses, afib with rvr, influenza A, acute renal failure, bil infiltrates, sepsis, temp pacer placed 2/8, intubated on 2/12 due to declining resp status, nephrology consulted , plan to place HD cath tomorrow and if kidney function does not improve plan for CVVHD.     2/13 Tomi Bamberger RN, BSN - Natasha Mead sndrome with 6-10 second pauses , afib with rvr, influenza A, acuter renal failure, bil infiltrates, sepsis. Temp pacer placed 2/8, intubated 2/12 on full vent support.  Nephrology consulted, plan for HD cath placement today and start CVVHD if no improvement in renal functin.  conts on iv abc, pressors, hep drip.   2/15 [place], BSN - abd distened,will get KUB today, started on CRRT.   2/19 Tomi Bamberger RN, BSN - Natasha Mead Syndrome with intermittent pauses, afib with RVR, acute resp failure secondary to flu A,on full vent support since 212, bil infiltrates, acute renal failure  Temp pacer, placed 2/8.  Nephrology following.  CRRT stopped paln for IHD, failed pressure support wean yesterday, Temp pacer pulled yesterday, plan for permanent pacer when ever acute issues over.  conts on amio, precedex, fentanyl, hep drip, iv abx.     2/20 Tomi Bamberger RN, BSN- CRRT stopped , now on IHD, will attempt SBT in am, if no significant wean in next few days MD will discuss trach. conts on amio, hep drip and solucortef.             Action/Plan: NCM  Will follow for transition of care needs.  Expected Discharge Date:                  Expected Discharge Plan:     In-House Referral:     Discharge planning  Services  CM Consult  Post Acute Care Choice:    Choice offered to:     DME Arranged:    DME Agency:     HH Arranged:    HH Agency:     Status of Service:  In process, will continue to follow  If discussed at Long Length of Stay Meetings, dates discussed:    Additional Comments:  Zenon Mayo, RN 09/20/2017, 8:03 PM

## 2017-09-21 ENCOUNTER — Inpatient Hospital Stay (HOSPITAL_COMMUNITY): Payer: Medicare (Managed Care)

## 2017-09-21 DIAGNOSIS — N179 Acute kidney failure, unspecified: Secondary | ICD-10-CM | POA: Diagnosis present

## 2017-09-21 DIAGNOSIS — R6521 Severe sepsis with septic shock: Secondary | ICD-10-CM

## 2017-09-21 DIAGNOSIS — A419 Sepsis, unspecified organism: Secondary | ICD-10-CM

## 2017-09-21 LAB — RENAL FUNCTION PANEL
Albumin: 1.5 g/dL — ABNORMAL LOW (ref 3.5–5.0)
Anion gap: 15 (ref 5–15)
BUN: 65 mg/dL — ABNORMAL HIGH (ref 6–20)
CO2: 18 mmol/L — ABNORMAL LOW (ref 22–32)
Calcium: 7.4 mg/dL — ABNORMAL LOW (ref 8.9–10.3)
Chloride: 97 mmol/L — ABNORMAL LOW (ref 101–111)
Creatinine, Ser: 4.16 mg/dL — ABNORMAL HIGH (ref 0.61–1.24)
GFR calc Af Amer: 16 mL/min — ABNORMAL LOW
GFR calc non Af Amer: 14 mL/min — ABNORMAL LOW
Glucose, Bld: 154 mg/dL — ABNORMAL HIGH (ref 65–99)
Phosphorus: 6 mg/dL — ABNORMAL HIGH (ref 2.5–4.6)
Potassium: 3.7 mmol/L (ref 3.5–5.1)
Sodium: 130 mmol/L — ABNORMAL LOW (ref 135–145)

## 2017-09-21 LAB — CBC
HCT: 27 % — ABNORMAL LOW (ref 39.0–52.0)
HEMOGLOBIN: 8.8 g/dL — AB (ref 13.0–17.0)
MCH: 26.6 pg (ref 26.0–34.0)
MCHC: 32.6 g/dL (ref 30.0–36.0)
MCV: 81.6 fL (ref 78.0–100.0)
Platelets: 264 10*3/uL (ref 150–400)
RBC: 3.31 MIL/uL — AB (ref 4.22–5.81)
RDW: 15.2 % (ref 11.5–15.5)
WBC: 20.3 10*3/uL — AB (ref 4.0–10.5)

## 2017-09-21 LAB — BASIC METABOLIC PANEL
ANION GAP: 17 — AB (ref 5–15)
BUN: 80 mg/dL — ABNORMAL HIGH (ref 6–20)
CHLORIDE: 93 mmol/L — AB (ref 101–111)
CO2: 17 mmol/L — AB (ref 22–32)
Calcium: 7.6 mg/dL — ABNORMAL LOW (ref 8.9–10.3)
Creatinine, Ser: 4.94 mg/dL — ABNORMAL HIGH (ref 0.61–1.24)
GFR calc Af Amer: 13 mL/min — ABNORMAL LOW (ref >60)
GFR calc non Af Amer: 11 mL/min — ABNORMAL LOW (ref >60)
Glucose, Bld: 147 mg/dL — ABNORMAL HIGH (ref 65–99)
POTASSIUM: 3.6 mmol/L (ref 3.5–5.1)
SODIUM: 127 mmol/L — AB (ref 135–145)

## 2017-09-21 LAB — GLUCOSE, CAPILLARY
GLUCOSE-CAPILLARY: 147 mg/dL — AB (ref 65–99)
Glucose-Capillary: 121 mg/dL — ABNORMAL HIGH (ref 65–99)
Glucose-Capillary: 130 mg/dL — ABNORMAL HIGH (ref 65–99)
Glucose-Capillary: 118 mg/dL — ABNORMAL HIGH (ref 65–99)

## 2017-09-21 LAB — BLOOD GAS, ARTERIAL
ACID-BASE DEFICIT: 5.5 mmol/L — AB (ref 0.0–2.0)
Bicarbonate: 19.1 mmol/L — ABNORMAL LOW (ref 20.0–28.0)
FIO2: 40
O2 Saturation: 95.9 %
PATIENT TEMPERATURE: 100.9
PCO2 ART: 38.1 mmHg (ref 32.0–48.0)
PEEP/CPAP: 8 cmH2O
PO2 ART: 100 mmHg (ref 83.0–108.0)
RATE: 28 resp/min
VT: 400 mL
pH, Arterial: 7.329 — ABNORMAL LOW (ref 7.350–7.450)

## 2017-09-21 LAB — PHOSPHORUS: Phosphorus: 6.4 mg/dL — ABNORMAL HIGH (ref 2.5–4.6)

## 2017-09-21 LAB — HEPARIN LEVEL (UNFRACTIONATED)
HEPARIN UNFRACTIONATED: 0.17 [IU]/mL — AB (ref 0.30–0.70)
Heparin Unfractionated: 0.3 IU/mL (ref 0.30–0.70)

## 2017-09-21 LAB — MAGNESIUM: Magnesium: 1.4 mg/dL — ABNORMAL LOW (ref 1.7–2.4)

## 2017-09-21 MED ORDER — HEPARIN (PORCINE) 2000 UNITS/L FOR CRRT
INTRAVENOUS_CENTRAL | Status: DC | PRN
  Filled 2017-09-21: qty 1000

## 2017-09-21 MED ORDER — SODIUM CHLORIDE 0.9 % IV SOLN
0.0000 ug/min | INTRAVENOUS | Status: DC
  Administered 2017-09-21: 20 ug/min via INTRAVENOUS
  Filled 2017-09-21: qty 2

## 2017-09-21 MED ORDER — MAGNESIUM SULFATE 2 GM/50ML IV SOLN
2.0000 g | Freq: Once | INTRAVENOUS | Status: AC
  Administered 2017-09-21: 2 g via INTRAVENOUS
  Filled 2017-09-21: qty 50

## 2017-09-21 MED ORDER — ORAL CARE MOUTH RINSE
15.0000 mL | OROMUCOSAL | Status: DC
  Administered 2017-09-21 – 2017-09-28 (×73): 15 mL via OROMUCOSAL

## 2017-09-21 MED ORDER — PRISMASOL BGK 4/2.5 32-4-2.5 MEQ/L IV SOLN
INTRAVENOUS | Status: DC
  Administered 2017-09-21 (×2): via INTRAVENOUS_CENTRAL
  Filled 2017-09-21 (×4): qty 5000

## 2017-09-21 MED ORDER — CHLORHEXIDINE GLUCONATE 0.12% ORAL RINSE (MEDLINE KIT)
15.0000 mL | Freq: Two times a day (BID) | OROMUCOSAL | Status: DC
  Administered 2017-09-21 – 2017-09-28 (×15): 15 mL via OROMUCOSAL

## 2017-09-21 MED ORDER — PHENYLEPHRINE HCL 10 MG/ML IJ SOLN
0.0000 ug/min | INTRAMUSCULAR | Status: DC
  Administered 2017-09-21: 20 ug/min via INTRAVENOUS
  Filled 2017-09-21: qty 10

## 2017-09-21 MED ORDER — HYDROCORTISONE NA SUCCINATE PF 100 MG IJ SOLR
50.0000 mg | Freq: Four times a day (QID) | INTRAMUSCULAR | Status: DC
Start: 2017-09-21 — End: 2017-09-26
  Administered 2017-09-21 – 2017-09-26 (×22): 50 mg via INTRAVENOUS
  Filled 2017-09-21 (×22): qty 2

## 2017-09-21 MED ORDER — PRISMASOL BGK 4/2.5 32-4-2.5 MEQ/L IV SOLN
INTRAVENOUS | Status: DC
  Administered 2017-09-21 – 2017-09-25 (×20): via INTRAVENOUS_CENTRAL
  Filled 2017-09-21 (×22): qty 5000

## 2017-09-21 MED ORDER — POTASSIUM CHLORIDE 20 MEQ/15ML (10%) PO SOLN
40.0000 meq | Freq: Once | ORAL | Status: AC
  Administered 2017-09-21: 40 meq
  Filled 2017-09-21: qty 30

## 2017-09-21 MED ORDER — PRISMASOL BGK 4/2.5 32-4-2.5 MEQ/L IV SOLN
INTRAVENOUS | Status: DC
  Administered 2017-09-21 – 2017-09-22 (×2): via INTRAVENOUS_CENTRAL
  Filled 2017-09-21 (×2): qty 5000

## 2017-09-21 MED ORDER — PANTOPRAZOLE SODIUM 40 MG PO PACK
40.0000 mg | PACK | Freq: Every day | ORAL | Status: DC
  Administered 2017-09-21 – 2017-09-28 (×8): 40 mg
  Filled 2017-09-21 (×8): qty 20

## 2017-09-21 MED ORDER — SODIUM CHLORIDE 0.9 % IV SOLN
2.0000 g | Freq: Two times a day (BID) | INTRAVENOUS | Status: DC
  Administered 2017-09-21 – 2017-09-25 (×8): 2 g via INTRAVENOUS
  Filled 2017-09-21 (×8): qty 2

## 2017-09-21 MED ORDER — NOREPINEPHRINE BITARTRATE 1 MG/ML IV SOLN
0.0000 ug/min | INTRAVENOUS | Status: DC
  Administered 2017-09-21: 16 ug/min via INTRAVENOUS
  Filled 2017-09-21 (×2): qty 16

## 2017-09-21 MED ORDER — HEPARIN SODIUM (PORCINE) 1000 UNIT/ML DIALYSIS
1000.0000 [IU] | INTRAMUSCULAR | Status: DC | PRN
  Administered 2017-09-21 – 2017-09-25 (×2): 2400 [IU] via INTRAVENOUS_CENTRAL
  Filled 2017-09-21: qty 3
  Filled 2017-09-21: qty 6
  Filled 2017-09-21: qty 3
  Filled 2017-09-21 (×2): qty 6

## 2017-09-21 NOTE — Progress Notes (Signed)
PULMONARY / CRITICAL CARE MEDICINE   Name: [name] MRN: [id number] DOB: [birth year]    ADMISSION DATE:  09/15/2017 CONSULTATION DATE: 09/18/2017  REFERRING MD: Neva Seat  Reason for consult: hypotension and worsening aA gradient  HISTORY OF PRESENT ILLNESS:        This is a 62 year old diabetic with a history of coronary disease and congestive heart failure with a baseline ejection fraction of 40-50%.  He was admitted on 2/8 complaining of generalized weakness and was found to be in atrial fibrillation with a rapid ventricular response with intermittent 7-8-second pauses.  A temporary pacing wire was placed.  He was noted to have infiltrates or CHF on his chest x-ray.  PCR for influenza a was positive.  He was started on a combination of Tamiflu as well as clindamycin and aztreonam due to a penicillin allergy.  He was diuresed but diuretics are currently being held as his creatinine has risen from approximately 0.9-2.5.  I was called today for hypotension and increasing oxygen requirements.  On my arrival the patient's mental status is very poor.  He is extremely lethargic and not giving any significant history.  He has had a nonproductive cough.  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  SUBJECTIVE:  Remains in [place].  SCr remains up, likely to need CVVHD.  VITAL SIGNS: BP (!) 142/77   Pulse 78   Temp (!) 101.2 F (38.4 C) (Oral)   Resp (!) 24   Ht 5' (1.524 m)   Wt 81.7 kg (180 lb 1.9 oz)   SpO2 100%   BMI 35.18 kg/m   HEMODYNAMICS: CVP:  [6 mmHg-21 mmHg] 13 mmHg  VENTILATOR SETTINGS: Vent Mode: PRVC FiO2 (%):  [40 %-70 %] 40 % Set Rate:  [28 bmp] 28 bmp Vt Set:  [400 mL] 400 mL PEEP:  [8 cmH20-14 cmH20] 8 cmH20 Plateau Pressure:  [20 OHY07-37 cmH20] 23 cmH20  INTAKE / OUTPUT: I/O last 3 completed shifts: In: 5115.9 [I.V.:3715.9; IV Piggyback:1400] Out: 595 [Urine:145; Emesis/NG  output:450]  PHYSICAL EXAMINATION:   General: Chronically ill-appearing patient, on vent Neuro: Sedated, does not follow commands Cardiovascular: IRIR, tachy. No MRG.The lower extremities are warm without edema but with chronic erythema. Lungs: On vent, respirations unlabored.  CTAB. Abdomen: The abdomen soft and non-tender. There are no obvious masses. Extremities:  Warm.  Right transmetatarsal amputation.  LABS:  BMET Recent Labs  Lab 09/19/17 0328 09/20/17 0345 09/21/17 0426  NA 133* 128* 127*  K 4.2 3.7 3.6  CL 93* 91* 93*  CO2 24 20* 17*  BUN 69* 78* 80*  CREATININE 3.35* 4.44* 4.94*  GLUCOSE 130* 217* 147*    Electrolytes Recent Labs  Lab 09/15/17 1639  09/19/17 0328 09/20/17 0345 09/21/17 0426  CALCIUM  --    < > 8.2* 8.0* 7.6*  MG 1.7  --   --   --  1.4*  PHOS  --   --   --   --  6.4*   < > = values in this interval not displayed.    CBC Recent Labs  Lab 09/19/17 0328 09/20/17 0345 09/21/17 0426  WBC 13.7* 23.2* 20.3*  HGB 9.7* 9.3* 8.8*  HCT 30.7* 28.9* 27.0*  PLT 226 325 264    Coag's No results for input(s): APTT, INR in the last 168 hours.  Sepsis Markers Recent Labs  Lab 09/18/17 1520 09/18/17 2051 09/19/17 0328 09/20/17 0345  LATICACIDVEN 1.2 1.3  --   --   PROCALCITON 1.54  --  1.85 12.09    ABG Recent Labs  Lab 09/19/17 1630 09/20/17 1159 09/21/17 0449  PHART 7.381 7.341* 7.329*  PCO2ART 33.9 36.0 38.1  PO2ART 63.0* 108.0 100    Liver Enzymes No results for input(s): AST, ALT, ALKPHOS, BILITOT, ALBUMIN in the last 168 hours.  Cardiac Enzymes Recent Labs  Lab 09/16/17 1159 09/16/17 1754 09/17/17 0436  TROPONINI 0.14* 0.17* 0.16*    Glucose Recent Labs  Lab 09/19/17 2215 09/20/17 0357 09/20/17 0729 09/20/17 1112 09/20/17 1613 09/20/17 2153  GLUCAP 198* 200* 196* 154* 155* 152*    Imaging No results found.    ANTIBIOTICS: Tamiflu 2/8 > 2/12  Cefepime 2/12 >  Linezolid 2/12 >  Clinda 2/11 >  2/12   LINES/TUBES: There is a left femoral introducer through which a pacing wire has been passed. ETT 2/12 >  L brachial art line 2/12 >  L IJ CVL 2/12 >    DISCUSSION:      This is a 62 year old diabetic with a history of LV dysfunction secondary to coronary artery disease as well as paroxysmal atrial fibrillation who presented with weakness, A. fib with rapid ventricular response, and intermittent pauses.  He has been found to be influenza A positive.  He has been placed on amiodarone and a temporary pacing wire has been placed. Respiratory status deteriorated 2/12; therefore, he required intubation.   ASSESSMENT / PLAN:  Acute hypoxemic respiratory failure with bilateral pulmonary infiltrates:  Potentially multifactorial. He is flu A positive and the incidence of bacterial co-infection is high, so will need to treat for both. Also here with AF-RVR and his rate remains elevated. Known systolic CHF as well. EF 40-45%, which is slightly down from 2017. Infection and volume both likely playing a role.  He is s/p intubation 2/12 after declining mental status. - Continue full vent support - Bronchial hygiene - Daily SBT - holding today given higher than normal O2 requirements and continued shock state - Continue zyvox, cefepime - Follow CXR  Shock: etiology unclear. Likely combo cardiogenic, septic (PCT 12), hypovolemia (CVP 6 - 9). Lactic acid WNL. Concern he could actually be volume down. He is also on chronic florinef, with a cortisol of 18. May not be supplementing this effectively.  - Levophed to keep MAP > 65 mmHg - D/c florinef, switch to stress dose steroids given minimal improvement - d/c metoprolol  Atrial fibrillation with Tachy/Brady syndrome: Temp pacer in place. Plans for PPM but currently too ill.  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 Will need ENT consult once improved.   CC time: 30 min.   [name], [facility] Pager: [phone]  or [phone] 09/21/2017, 7:24 AM  Attending Note:  62 year old male with Flu A, ARDS, renal failure and septic  shock.  Patient's urine output has been negligible overnight.  On exam, coarse BS diffusely.  I reviewed CXR myself, ETT is in good position and infiltrate is worse.  Discussed with renal, will place HD catheter today and will begin CRRT to address electrolytes and fluid status.  Maintain PEEP at 5 and FiO2 at 40%.  Hold off weaning for now.  PCCM will continue to follow.  The patient is critically ill with multiple organ systems failure and requires high complexity decision making for assessment and support, frequent evaluation and titration of therapies, application of advanced monitoring technologies and extensive interpretation of multiple databases.   Critical Care Time devoted to patient care services described in this note is  35  Minutes. This time reflects time of care of this signee Dr [name]. This critical care time does not reflect procedure time, or teaching time or supervisory time of PA/NP/Med student/Med Resident etc but could involve care discussion time.  Rush Farmer, M.D. [facility]monary/Critical Care Medicine. Pager: [phone]. After hours pager: [phone].

## 2017-09-21 NOTE — Progress Notes (Signed)
[place] for Heparin  Indication: atrial fibrillation  Allergies  Allergen Reactions  . Penicillins Hives, Nausea And Vomiting, Swelling and Other (See Comments)    Has patient had a PCN reaction causing immediate rash, facial/tongue/throat swelling, SOB or lightheadedness with hypotension: YES Has patient had a PCN reaction causing severe rash involving mucus membranes or skin necrosis: No Has patient had a PCN reaction that required hospitalization No Has patient had a PCN reaction occurring within the last 10 years: No If all of the above answers are "NO", then may proceed with Cephalosporin use.     Patient Measurements: Height: 5' (152.4 cm) Weight: 172 lb 13.5 oz (78.4 kg) IBW/kg (Calculated) : 50   Vital Signs: Temp: 101.2 F (38.4 C) (02/13 2355) Temp Source: Oral (02/13 2355) BP: 142/77 (02/14 0415) Pulse Rate: 78 (02/14 0415)  Labs: Recent Labs    09/19/17 0328 09/19/17 1141 09/20/17 0345 09/21/17 0426  HGB 9.7*  --  9.3* 8.8*  HCT 30.7*  --  28.9* 27.0*  PLT 226  --  325 264  HEPARINUNFRC 0.20* 0.25* 0.33 0.17*  CREATININE 3.35*  --  4.44*  --     Assessment: 61yom admitted with flu.  Hx Afib previously in anticoagulation but stopped d/t GIB in 2015.  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  Plan to anticoagulate with heparin in meantime and apixaban post PPM. Of note he is now on a ventilator.   2/14 AM: heparin level low  Goal of Therapy:  Heparin level 0.3-0.7 units/ml Monitor platelets by anticoagulation protocol: Yes   Plan:  Increase heparin to 1500 units/hr 1300 HL Daily HL, CBC  Narda Bonds, PharmD, BCPS Clinical Pharmacist Phone: [phone]

## 2017-09-21 NOTE — Progress Notes (Signed)
Initial Nutrition Assessment  DOCUMENTATION CODES:   Obesity unspecified  INTERVENTION:   If pt remains intubated recommend:  Vital High Protein @ 40 ml/hr (960 ml/day) 30 ml Prostat BID  Provides: 1160 kcal, 114 grams protein, and 802 ml free water.    NUTRITION DIAGNOSIS:   Increased nutrient needs related to acute illness as evidenced by estimated needs.  GOAL:   Provide needs based on ASPEN/SCCM guidelines  MONITOR:   Vent status, I & O's  REASON FOR ASSESSMENT:   Ventilator    ASSESSMENT:   Pt with PMH of DM, CAD, afib admitted with flu A, now intubated with ARDS, multiorgan failure and septic shock. Pt starting CRRT today.    No family in the room, spoke to RN at bedside.  Patient is currently intubated on ventilator support MAP 68-75   Medications reviewed and include: solucortef, SSI, lantus, levophed @ 13.1 (switching to neosynephrine)  Labs reviewed: Na 127 (L), PO4: 6.4 (H), magnesium 1.4 (L) CBG's: 121-130  UOP: 125 ml x 24 hr Pt positive 10 L, pt's weight up 6 lb, admission weight 174 lb (78.9 kg) with a BMI of 33.98  NUTRITION - FOCUSED PHYSICAL EXAM:    Most Recent Value  Orbital Region  No depletion  Upper Arm Region  No depletion  Thoracic and Lumbar Region  No depletion  Buccal Region  No depletion  Temple Region  No depletion  Clavicle Bone Region  No depletion  Clavicle and Acromion Bone Region  No depletion  Scapular Bone Region  Unable to assess  Dorsal Hand  No depletion  Patellar Region  No depletion  Anterior Thigh Region  No depletion  Posterior Calf Region  No depletion  Edema (RD Assessment)  Mild  Hair  Reviewed  Eyes  Unable to assess  Mouth  Unable to assess  Skin  Reviewed  Nails  Reviewed       Diet Order:  No diet orders on file  EDUCATION NEEDS:   No education needs have been identified at this time  Skin:  Skin Assessment: (partial R foot amputation; MASD goin; mulitple scabs BLE)  Last BM:  2/12  large  Height:   Ht Readings from Last 1 Encounters:  09/15/17 5' (1.524 m)    Weight:   Wt Readings from Last 1 Encounters:  09/21/17 180 lb 1.9 oz (81.7 kg)    Ideal Body Weight:  48.1 kg  BMI:  Body mass index is 35.18 kg/m.  Estimated Nutritional Needs:   Kcal:  1000-1200  Protein:  100-120 grams  Fluid:  >1.5 L/day  Maylon Peppers RD, LDN, CNSC [phone] Pager [phone] After Hours Pager

## 2017-09-21 NOTE — Procedures (Signed)
Hemodialysis Catheter Insertion Procedure Note [name] [id number] 1955-08-28  Procedure: Insertion of Hemodialysis Catheter Indications: CRRT  Procedure Details Consent: Unable to obtain consent because of altered level of consciousness. Time Out: Verified patient identification, verified procedure, site/side was marked, verified correct patient position, special equipment/implants available, medications/allergies/relevent history reviewed, required imaging and test results available.  Performed  Maximum sterile technique was used including antiseptics, cap, gloves, gown, hand hygiene, mask and sheet. Skin prep: Chlorhexidine; local anesthetic administered A antimicrobial bonded/coated triple lumen catheter was placed in the right internal jugular vein using the Seldinger technique.  Evaluation Blood flow good Complications: No apparent complications Patient did tolerate procedure well. Chest X-ray ordered to verify placement.  CXR: pending.  Procedure performed under direct ultrasound guidance for real time vessel cannulation.      [name], [place] Pgr: [phone]  or [phone] 09/21/2017, 10:31 AM

## 2017-09-21 NOTE — Progress Notes (Signed)
[name] Progress Note    Assessment/ Plan:   Acute kidney injury: baseline Cr appears to have been 0.88-1.12 in early 2018 but over the last year has ranged 1.6-1.7. Cr on presentation 1.69 >>2.54 >> 4.44. Etiology likely combination of prerenal and ischemic ATN (sepsis with hypotension requiring pressors, Cr began to increase more rapidly after requiring pressors), but could have contributing intrinsic dysfunction (ATN 2/2 hypotension vs ATN 2/2 vancomycin). Nephrotoxic agents during this hospital stay include gabapentin 671m BID, vancomycin. Last diuretic 2/11, UOP minimal > 540cc 2/12 > minimal 2/13. Unable to assess uremia secondary to sedation, BUN continues to increase to 80. -CCM to place HD cath -CRRT for oliguric renal failure pending HD access  Shock: possibly septic 2/2 bacterial superinfection s/p flu vs hypovolemia.  -pressors per CCM (ECHO with EF of 40-45% and has ongoing bradycardia without temporary pacemaker)  Hyponatremia: ?SIADH in the setting of critical illness.  -monitor daily   Symptomatic bradycardia - EP following and he is currently not a candidate for PPM due to his multiorgan failure and ongoing infections.    DM- per primary team Afib with RVR per cards.  Acute hypoxic respiratory failure per CCM.   Subjective:   [name] sister and caretaker, at bedside. She agrees that the patient would want HD. She is amenable to starting today.    Objective:   BP 112/66   Pulse (!) 156   Temp (!) 100.9 F (38.3 C) (Axillary)   Resp (!) 30   Ht 5' (1.524 m)   Wt 180 lb 1.9 oz (81.7 kg)   SpO2 97%   BMI 35.18 kg/m   Intake/Output Summary (Last 24 hours) at 09/21/2017 05361Last data filed at 09/21/2017 0603 Gross per 24 hour  Intake 3150.44 ml  Output 115 ml  Net 3035.44 ml   Weight change: 7 lb 4.4 oz (3.3 kg)  Physical Exam: Gen:ill appearing elderly male lying in bed, intubated. CWER:[id number] regular rhythm Resp: coarse breath  sounds, mechanically ventilated. Abd: +distention, unable to appreciate tenderness, faint BS Ext: no edema, scabs over anterior shins bilaterally  Imaging: Dg Chest Port 1 View  Result Date: 09/21/2017 CLINICAL DATA:  Respiratory failure, COPD, ischemic cardiomyopathy. EXAM: PORTABLE CHEST 1 VIEW COMPARISON:  Portable chest x-ray of September 20, 2017 FINDINGS: The lungs are adequately inflated. The interstitial markings are more conspicuous on the right today. They are stable at the left lung base. The cardiac silhouette is mildly enlarged. The central pulmonary vascularity is prominent. The sternal wires are intact. There calcification in the wall of the aortic arch. The endotracheal tube tip lies 3.2 cm above the carina. The esophagogastric tube tip projects below the inferior margin of the image. The left internal jugular venous catheter tip projects over the midportion of the SVC. IMPRESSION: Slight interval increase in the conspicuity of the pulmonary interstitium on the right. Fairly stable increased interstitial markings at the left base. The findings may reflect interstitial edema or pneumonia. The support tubes are in reasonable position. Thoracic aortic atherosclerosis. Electronically Signed   By: David  JMartiniqueM.D.   On: 09/21/2017 07:41   Dg Chest Port 1 View  Result Date: 09/20/2017 CLINICAL DATA:  Hypoxia EXAM: PORTABLE CHEST 1 VIEW COMPARISON:  September 19, 2017 FINDINGS: Endotracheal tube tip is 2.8 cm above the carina. Central catheter tip is in the superior vena cava. Nasogastric tube tip and side port are below the diaphragm. There is a pacemaker lead attached to the right ventricle, placed from  below the diaphragm. No pneumothorax. There is atelectatic change in both lung bases. Lungs elsewhere are clear. Heart is upper normal in size with pulmonary vascularity within normal limits. There is aortic atherosclerosis. No adenopathy. No bone lesions. IMPRESSION: Tube and catheter positions  as described without evident pneumothorax. Bibasilar atelectasis. Lungs elsewhere clear. Stable cardiac silhouette. There is aortic atherosclerosis. Aortic Atherosclerosis (ICD10-I70.0). Electronically Signed   By: Lowella Grip III M.D.   On: 09/20/2017 08:06   Dg Chest Port 1 View  Result Date: 09/19/2017 CLINICAL DATA:  Respiratory failure. Central venous catheter placement. EXAM: PORTABLE CHEST 1 VIEW COMPARISON:  09/19/2017 at 1507 hours FINDINGS: New left IJ approach central line catheter crosses midline and terminates in the mid SVC. No pneumothorax is noted. Heart is borderline enlarged but stable. Aortic atherosclerosis at the arch is identified. Patient is status post median sternotomy. Endotracheal and gastric tubes are in satisfactory position with endotracheal tube tip 3.5 cm above the carina. Gastric tube extends below the left hemidiaphragm. Right femoral pacing wire tip noted over the right ventricle. Post CABG change are redemonstrated. Bibasilar atelectasis and/or pneumonia appears stable. No overt pulmonary edema. Trace right effusion is stable. IMPRESSION: 1. Satisfactory support line and tube positions. New left IJ approach IJ central line catheter terminates in the mid SVC. No pneumothorax. 2. Stable bibasilar atelectasis and/or infiltrates with trace right pleural effusion. 3. Aortic atherosclerosis without aneurysm. Electronically Signed   By: Ashley Royalty M.D.   On: 09/19/2017 17:48   Dg Chest Port 1 View  Result Date: 09/19/2017 CLINICAL DATA:  Intubation. EXAM: PORTABLE CHEST 1 VIEW COMPARISON:  09/18/2017. FINDINGS: Endotracheal tube tip noted approximately 3 cm above the carina. NG tube noted with tip below left hemidiaphragm. Right femoral pacer wire tip noted over the right ventricle. Prior CABG. Heart size stable. Persistent but improved bibasilar infiltrates. Small right pleural effusion. No pneumothorax. IMPRESSION: 1. Endotracheal tube and NG tube in good anatomic  position. Right femoral pacer wire tip noted over the right ventricle. 2. Persistent but improved bibasilar infiltrates. Small right pleural effusion. 3.  Prior CABG.  Stable heart size.  No pulmonary venous congestion. Electronically Signed   By: Marcello Moores  Register   On: 09/19/2017 15:19    Labs: BMET Recent Labs  Lab 09/15/17 6378 09/15/17 5885 09/16/17 0200 09/17/17 0436 09/18/17 0446 09/19/17 0328 09/20/17 0345 09/21/17 0426  NA 135 136 135 139 136 133* 128* 127*  K 3.4* 3.6 3.6 3.3* 3.6 4.2 3.7 3.6  CL 92* 91* 93* 96* 94* 93* 91* 93*  CO2 28  --  _0 20* 17*  GLUCOSE 145* 140* 81 143* 137* 130* 217* 147*  BUN 55* 57* 56* 53* 57* 69* 78* 80*  CREATININE 1.69* 1.70* 1.59* 1.68* 2.54* 3.35* 4.44* 4.94*  CALCIUM 9.2  --  8.9 9.0 8.5* 8.2* 8.0* 7.6*  PHOS  --   --   --   --   --   --   --  6.4*   CBC Recent Labs  Lab 09/18/17 0446 09/19/17 0328 09/20/17 0345 09/21/17 0426  WBC 14.9* 13.7* 23.2* 20.3*  HGB 9.3* 9.7* 9.3* 8.8*  HCT 30.2* 30.7* 28.9* 27.0*  MCV 85.3 84.6 82.6 81.6  PLT 259 226 325 264    Medications:    . atorvastatin  80 mg Per Tube Daily  . chlorhexidine gluconate (MEDLINE KIT)  15 mL Mouth Rinse BID  . fentaNYL (SUBLIMAZE) injection  50 mcg Intravenous Once  . hydrocortisone  sod succinate (SOLU-CORTEF) inj  50 mg Intravenous Q6H  . Influenza vac split quadrivalent PF  0.5 mL Intramuscular Tomorrow-1000  . insulin aspart  0-9 Units Subcutaneous TID WC  . insulin glargine  10 Units Subcutaneous QHS  . ipratropium-albuterol  3 mL Nebulization BID  . mouth rinse  15 mL Mouth Rinse 10 times per day  . pantoprazole  40 mg Oral Daily  . sodium chloride flush  3 mL Intravenous Q12H    Ralene Ok, MD 09/21/17  I have seen and examined this patient and agree with plan and assessment in the above note with renal recommendations/intervention highlighted.  Pt with oliguric/anuric ARF in setting of severe sepsis syndrome requiring multiple  pressors.  Plan to initiate CVVHD and follow, however overall prognosis remains poor.  Would benefit from Palliative Care consult to help set goals/limits of care. Broadus John A Nikesha Kwasny,MD 09/21/2017 12:08 PM

## 2017-09-21 NOTE — Progress Notes (Signed)
[facility] Progress Note Patient Name: [name] DOB: 1956-06-02 MRN: [id number]   Date of Service  09/21/2017  HPI/Events of Note  hypokalemia  eICU Interventions  Potassium replaced     Intervention Category Minor Interventions: Electrolytes abnormality - evaluation and management  Tenelle Andreason 09/21/2017, 6:19 AM

## 2017-09-21 NOTE — Progress Notes (Signed)
ANTICOAGULATION CONSULT NOTE - Follow Up Consult  Pharmacy Consult for Heparin Indication: atrial fibrillation  Allergies  Allergen Reactions  . Penicillins Hives, Nausea And Vomiting, Swelling and Other (See Comments)    Has patient had a PCN reaction causing immediate rash, facial/tongue/throat swelling, SOB or lightheadedness with hypotension: YES Has patient had a PCN reaction causing severe rash involving mucus membranes or skin necrosis: No Has patient had a PCN reaction that required hospitalization No Has patient had a PCN reaction occurring within the last 10 years: No If all of the above answers are "NO", then may proceed with Cephalosporin use.     Patient Measurements: Height: 5' (152.4 cm) Weight: 180 lb 1.9 oz (81.7 kg) IBW/kg (Calculated) : 50 Heparin Dosing Weight:    Vital Signs: Temp: 97.2 F (36.2 C) (02/14 1400) Temp Source: Axillary (02/14 1400) BP: 118/72 (02/14 1400) Pulse Rate: 59 (02/14 1429)  Labs: Recent Labs    09/19/17 0328  09/20/17 0345 09/21/17 0426 09/21/17 1300  HGB 9.7*  --  9.3* 8.8*  --   HCT 30.7*  --  28.9* 27.0*  --   PLT 226  --  325 264  --   HEPARINUNFRC 0.20*   < > 0.33 0.17* 0.30  CREATININE 3.35*  --  4.44* 4.94*  --    < > = values in this interval not displayed.    Estimated Creatinine Clearance: 13.6 mL/min (A) (by C-G formula based on SCr of 4.94 mg/dL (H)).  Assessment:  Anticoag: Xarelto 15mg  PTA for Afib per med rec, cards note says pt is NOT on anticoag 2/2 GIB in past. Regardless plans are for [facility] once procedures completed (likely apixaban) -HL= 0.17>0.3 borderline, Hgb down to 8.8 this AM. Plts 325>264  Goal of Therapy:  Heparin level 0.3-0.7 units/ml Monitor platelets by anticoagulation protocol: Yes   Plan:  Increase IV heparin slightly to 1600 units/hr Daily HL and CBC  Glema Takaki S. Alford Highland, PharmD, BCPS Clinical Staff Pharmacist Pager [phone]  Eilene Ghazi Stillinger 09/21/2017,2:42 PM

## 2017-09-21 NOTE — Progress Notes (Signed)
At approx 1400 Pt turned to low-intermittent suction, 1000 ml clear brown liquid out. I had only previously added 100 ml with meds. Shearon Stalls, PA made aware. Will hold off on tube feeds per PA and reassess tomorrow. Will continue to monitor.  Lucius Conn, RN

## 2017-09-21 NOTE — Progress Notes (Signed)
PCCM Interval Progress Note  Pt continues to have AFRVR.  Will d/c levophed and switch to neosynephrine to see if this helps improve heart rate.   [name], [facility] Pager: [phone]  or [phone] 09/21/2017, 3:58 PM

## 2017-09-21 NOTE — Progress Notes (Signed)
Pharmacy Antibiotic Note  [name] is a 62 y.o. male with afib with tachy/brady with temp pacer in place now respiratory failure and shock. Pharmacy consulted to dose cefepime and he is also on Zyvox. He also just completed a course of Tamiflu -WBC = 20.3, tmax= 101.5, PCT= 1.85>> 12 -CRRT to start 2/14  -PCN allergy noted but has tolerated cefepime in the past  Plan: -Change cefepime to 2gm IV q12h once CRRT starts -Will follow cultures and clinical progress    Height: 5' (152.4 cm) Weight: 180 lb 1.9 oz (81.7 kg) IBW/kg (Calculated) : 50  Temp (24hrs), Avg:100.2 F (37.9 C), Min:98.9 F (37.2 C), Max:101.5 F (38.6 C)  Recent Labs  Lab 09/16/17 0200 09/17/17 0436 09/18/17 0446 09/18/17 1520 09/18/17 2051 09/19/17 0328 09/20/17 0345 09/21/17 0426  WBC 5.4  --  14.9*  --   --  13.7* 23.2* 20.3*  CREATININE 1.59* 1.68* 2.54*  --   --  3.35* 4.44* 4.94*  LATICACIDVEN  --   --   --  1.2 1.3  --   --   --     Estimated Creatinine Clearance: 13.6 mL/min (A) (by C-G formula based on SCr of 4.94 mg/dL (H)).    Allergies  Allergen Reactions  . Penicillins Hives, Nausea And Vomiting, Swelling and Other (See Comments)    Has patient had a PCN reaction causing immediate rash, facial/tongue/throat swelling, SOB or lightheadedness with hypotension: YES Has patient had a PCN reaction causing severe rash involving mucus membranes or skin necrosis: No Has patient had a PCN reaction that required hospitalization No Has patient had a PCN reaction occurring within the last 10 years: No If all of the above answers are "NO", then may proceed with Cephalosporin use.      Thank you for allowing pharmacy to be a part of this patient's care.  Hildred Laser, Pharm D 09/21/2017 10:24 AM

## 2017-09-22 ENCOUNTER — Inpatient Hospital Stay (HOSPITAL_COMMUNITY): Payer: Medicare (Managed Care)

## 2017-09-22 LAB — BLOOD GAS, ARTERIAL
Acid-base deficit: 2.6 mmol/L — ABNORMAL HIGH (ref 0.0–2.0)
BICARBONATE: 22 mmol/L (ref 20.0–28.0)
FIO2: 40
O2 Saturation: 96.4 %
PCO2 ART: 38.7 mmHg (ref 32.0–48.0)
PEEP: 5 cmH2O
Patient temperature: 97.1
RATE: 28 resp/min
VT: 400 mL
pH, Arterial: 7.368 (ref 7.350–7.450)
pO2, Arterial: 90.8 mmHg (ref 83.0–108.0)

## 2017-09-22 LAB — BASIC METABOLIC PANEL
Anion gap: 14 (ref 5–15)
BUN: 43 mg/dL — AB (ref 6–20)
CO2: 19 mmol/L — ABNORMAL LOW (ref 22–32)
CREATININE: 2.8 mg/dL — AB (ref 0.61–1.24)
Calcium: 7.8 mg/dL — ABNORMAL LOW (ref 8.9–10.3)
Chloride: 100 mmol/L — ABNORMAL LOW (ref 101–111)
GFR, EST AFRICAN AMERICAN: 26 mL/min — AB (ref >60)
GFR, EST NON AFRICAN AMERICAN: 22 mL/min — AB (ref >60)
Glucose, Bld: 159 mg/dL — ABNORMAL HIGH (ref 65–99)
Potassium: 4 mmol/L (ref 3.5–5.1)
SODIUM: 133 mmol/L — AB (ref 135–145)

## 2017-09-22 LAB — GLUCOSE, CAPILLARY
GLUCOSE-CAPILLARY: 133 mg/dL — AB (ref 65–99)
Glucose-Capillary: 145 mg/dL — ABNORMAL HIGH (ref 65–99)
Glucose-Capillary: 83 mg/dL (ref 65–99)
Glucose-Capillary: 124 mg/dL — ABNORMAL HIGH (ref 65–99)

## 2017-09-22 LAB — CBC
HCT: 26.2 % — ABNORMAL LOW (ref 39.0–52.0)
Hemoglobin: 8.5 g/dL — ABNORMAL LOW (ref 13.0–17.0)
MCH: 26.2 pg (ref 26.0–34.0)
MCHC: 32.4 g/dL (ref 30.0–36.0)
MCV: 80.6 fL (ref 78.0–100.0)
PLATELETS: 218 10*3/uL (ref 150–400)
RBC: 3.25 MIL/uL — AB (ref 4.22–5.81)
RDW: 15 % (ref 11.5–15.5)
WBC: 11.9 10*3/uL — AB (ref 4.0–10.5)

## 2017-09-22 LAB — HEPATIC FUNCTION PANEL
ALBUMIN: 1.6 g/dL — AB (ref 3.5–5.0)
ALK PHOS: 60 U/L (ref 38–126)
ALT: 15 U/L — AB (ref 17–63)
AST: 48 U/L — AB (ref 15–41)
BILIRUBIN TOTAL: 0.5 mg/dL (ref 0.3–1.2)
Bilirubin, Direct: 0.1 mg/dL (ref 0.1–0.5)
Indirect Bilirubin: 0.4 mg/dL (ref 0.3–0.9)
Total Protein: 6.2 g/dL — ABNORMAL LOW (ref 6.5–8.1)

## 2017-09-22 LAB — HEPARIN LEVEL (UNFRACTIONATED): HEPARIN UNFRACTIONATED: 0.72 [IU]/mL — AB (ref 0.30–0.70)

## 2017-09-22 LAB — PHOSPHORUS: PHOSPHORUS: 4.7 mg/dL — AB (ref 2.5–4.6)

## 2017-09-22 LAB — RENAL FUNCTION PANEL
ALBUMIN: 1.5 g/dL — AB (ref 3.5–5.0)
ANION GAP: 14 (ref 5–15)
BUN: 33 mg/dL — ABNORMAL HIGH (ref 6–20)
CHLORIDE: 104 mmol/L (ref 101–111)
CO2: 21 mmol/L — ABNORMAL LOW (ref 22–32)
Calcium: 7.8 mg/dL — ABNORMAL LOW (ref 8.9–10.3)
Creatinine, Ser: 2.28 mg/dL — ABNORMAL HIGH (ref 0.61–1.24)
GFR calc Af Amer: 33 mL/min — ABNORMAL LOW (ref >60)
GFR calc non Af Amer: 29 mL/min — ABNORMAL LOW (ref >60)
GLUCOSE: 140 mg/dL — AB (ref 65–99)
PHOSPHORUS: 3.3 mg/dL (ref 2.5–4.6)
POTASSIUM: 3.8 mmol/L (ref 3.5–5.1)
Sodium: 139 mmol/L (ref 135–145)

## 2017-09-22 LAB — MAGNESIUM: MAGNESIUM: 2.2 mg/dL (ref 1.7–2.4)

## 2017-09-22 MED ORDER — PRISMASOL BGK 4/2.5 32-4-2.5 MEQ/L IV SOLN
INTRAVENOUS | Status: DC
  Administered 2017-09-22: 08:00:00 via INTRAVENOUS_CENTRAL
  Filled 2017-09-22: qty 5000

## 2017-09-22 MED ORDER — PHENYLEPHRINE HCL 10 MG/ML IJ SOLN
0.0000 ug/min | INTRAMUSCULAR | Status: DC
  Filled 2017-09-22: qty 4

## 2017-09-22 MED ORDER — INFLUENZA VAC SPLIT QUAD 0.5 ML IM SUSY: 0.5000 mL | PREFILLED_SYRINGE | INTRAMUSCULAR | Status: AC | PRN

## 2017-09-22 MED ORDER — PRISMASOL BGK 4/2.5 32-4-2.5 MEQ/L IV SOLN
INTRAVENOUS | Status: DC
  Administered 2017-09-22 – 2017-09-25 (×4): via INTRAVENOUS_CENTRAL
  Filled 2017-09-22 (×6): qty 5000

## 2017-09-22 MED ORDER — HYDROCERIN EX CREA
TOPICAL_CREAM | Freq: Two times a day (BID) | CUTANEOUS | Status: DC
Start: 2017-09-22 — End: 2017-10-26
  Administered 2017-09-22 – 2017-09-24 (×5): via TOPICAL
  Administered 2017-09-24: 1 via TOPICAL
  Administered 2017-09-25 – 2017-09-26 (×4): via TOPICAL
  Administered 2017-09-27 (×2): 1 via TOPICAL
  Administered 2017-09-28 – 2017-10-13 (×19): via TOPICAL
  Administered 2017-10-14: 1 via TOPICAL
  Administered 2017-10-14 – 2017-10-22 (×15): via TOPICAL
  Administered 2017-10-22: 1 via TOPICAL
  Administered 2017-10-23 – 2017-10-26 (×6): via TOPICAL
  Filled 2017-09-22 (×4): qty 113

## 2017-09-22 MED ORDER — MIDAZOLAM HCL 2 MG/2ML IJ SOLN
1.0000 mg | INTRAMUSCULAR | Status: DC | PRN
  Administered 2017-09-22 – 2017-09-25 (×3): 2 mg via INTRAVENOUS
  Filled 2017-09-22 (×3): qty 2

## 2017-09-22 MED ORDER — PRISMASOL BGK 4/2.5 32-4-2.5 MEQ/L IV SOLN
INTRAVENOUS | Status: DC
  Administered 2017-09-22 – 2017-09-25 (×8): via INTRAVENOUS_CENTRAL
  Filled 2017-09-22 (×9): qty 5000

## 2017-09-22 NOTE — Progress Notes (Signed)
EP MD Virl Axe paged as pt's temporary transvenous pacemaker not sensing or capturing appropriately. Pt pacemaker turned off but still attached to patient. Pt currently in NSR at 64. MD verbal order for portable chest x ray to assess placement. RN to call MD when image is available to be read. Will continue to monitor closely RN at bedside.  Lucius Conn, RN

## 2017-09-22 NOTE — Progress Notes (Signed)
Dr. Elsworth Soho updated on pt's agitation/anxiety despite max doses of Fentanyl and Precedex drips. Order received for PRN Versed pushes. Will implement and continue to monitor pt closely.

## 2017-09-22 NOTE — Progress Notes (Signed)
[facility] Progress Note    Assessment/ Plan:   Acute kidney injury: baseline Cr appears to have been 0.88-1.12 in early 2018 but over the last year has ranged 1.6-1.7. Cr on presentation 1.69 >>2.54 >> 4.44. Etiology likely combination of prerenal and ischemic ATN (sepsis with hypotension requiring pressors, Cr began to increase more rapidly after requiring pressors), but could have contributing intrinsic dysfunction (ATN 2/2 hypotension vs ATN 2/2 vancomycin).  -CRRT for oliguric renal failure, continue to keep even  Shock: possibly septic 2/2 bacterial superinfection s/p flu vs hypovolemia. Febrile early 2/14, now hypothermic on warmer.  -pressors per CCM (ECHO with EF of 40-45% and has ongoing bradycardia without temporary pacemaker)  Hyponatremia: ?SIADH in the setting of critical illness.  -monitor daily   Abdominal distention in setting of large ventral hernia: no BM and unable to appreciate BSs.  -consider KUB per primary  DM- per primary team Afib with RVR per cards.  Acute hypoxic respiratory failure per CCM.   Subjective:   No family at bedside. RN c/w no BM and ?abdominal distention.    Objective:   BP 123/64   Pulse (!) 59   Temp 97.6 F (36.4 C) (Axillary)   Resp (!) 28   Ht 5' (1.524 m)   Wt 173 lb 1 oz (78.5 kg)   SpO2 98%   BMI 33.80 kg/m   Intake/Output Summary (Last 24 hours) at 09/22/2017 0818 Last data filed at 09/22/2017 0700 Gross per 24 hour  Intake 2780.89 ml  Output 3298 ml  Net -517.11 ml   Weight change: -0.9 oz (-3.2 kg)  Physical Exam: Gen:ill appearing elderly male lying in bed, intubated. CVS:bradycardic, regular rhythm Resp: coarse breath sounds, mechanically ventilated. Abd: +distention, unable to appreciate tenderness, no BS Ext: no edema, scabs over anterior shins bilaterally  Imaging: Dg Chest Port 1 View  Result Date: 09/22/2017 CLINICAL DATA:  Respiratory failure. EXAM: PORTABLE CHEST 1 VIEW COMPARISON:   09/21/2017. FINDINGS: Endotracheal tube, NG tube, left IJ line, right IJ line, femoral pacer in stable position. Prior CABG. Stable cardiomegaly. Persistent but improved bilateral interstitial prominence noted. Findings suggest persistent but improving CHF. No prominent pleural effusion or pneumothorax. Surgical clips are noted in the neck. IMPRESSION: 1.  Lines and tubes in stable position.  No pneumothorax. 2. Prior CABG. Stable cardiomegaly. Persistent but improved bilateral interstitial prominence noted. Findings suggest persistent but improving CHF. Electronically Signed   By: Thomas  Register   On: 09/22/2017 07:36   Dg Chest Port 1 View  Result Date: 09/21/2017 CLINICAL DATA:  Central line placement. EXAM: PORTABLE CHEST 1 VIEW COMPARISON:  09/21/2017 FINDINGS: Endotracheal tube terminates approximately 3 cm above the carina. A new right jugular catheter terminates over the mid SVC. Left jugular catheter terminates over the upper SVC, unchanged. Enteric tube courses into the left upper abdomen with tip not imaged. Sequelae of prior CABG are again identified. The cardiomediastinal silhouette is unchanged. There is mild central pulmonary vascular congestion. Increased interstitial markings in both lungs appear slightly increased. Retrocardiac opacity in the left lower lobe has increased. No sizable pleural effusion or pneumothorax is identified. IMPRESSION: 1. New right jugular catheter terminates over the mid SVC. 2. Slightly increased interstitial opacity bilaterally, likely mild edema. 3. Increased left basilar opacity which may reflect atelectasis. Electronically Signed   By: Allen  Grady M.D.   On: 09/21/2017 11:10   Dg Chest Port 1 View  Result Date: 09/21/2017 CLINICAL DATA:  Respiratory failure, COPD, ischemic cardiomyopathy. EXAM:   PORTABLE CHEST 1 VIEW COMPARISON:  Portable chest x-ray of September 20, 2017 FINDINGS: The lungs are adequately inflated. The interstitial markings are more  conspicuous on the right today. They are stable at the left lung base. The cardiac silhouette is mildly enlarged. The central pulmonary vascularity is prominent. The sternal wires are intact. There calcification in the wall of the aortic arch. The endotracheal tube tip lies 3.2 cm above the carina. The esophagogastric tube tip projects below the inferior margin of the image. The left internal jugular venous catheter tip projects over the midportion of the SVC. IMPRESSION: Slight interval increase in the conspicuity of the pulmonary interstitium on the right. Fairly stable increased interstitial markings at the left base. The findings may reflect interstitial edema or pneumonia. The support tubes are in reasonable position. Thoracic aortic atherosclerosis. Electronically Signed   By: David  Martinique M.D.   On: 09/21/2017 07:41    Labs: BMET Recent Labs  Lab 09/17/17 0436 09/18/17 0446 09/19/17 0328 09/20/17 0345 09/21/17 0426 09/21/17 1555 09/22/17 0350  NA 139 136 133* 128* 127* 130* 133*  K 3.3* 3.6 4.2 3.7 3.6 3.7 4.0  CL 96* 94* 93* 91* 93* 97* 100*  CO2 _0 20* 17* 18* 19*  GLUCOSE 143* 137* 130* 217* 147* 154* 159*  BUN 53* 57* 69* 78* 80* 65* 43*  CREATININE 1.68* 2.54* 3.35* 4.44* 4.94* 4.16* 2.80*  CALCIUM 9.0 8.5* 8.2* 8.0* 7.6* 7.4* 7.8*  PHOS  --   --   --   --  6.4* 6.0* 4.7*   CBC Recent Labs  Lab 09/19/17 0328 09/20/17 0345 09/21/17 0426 09/22/17 0350  WBC 13.7* 23.2* 20.3* 11.9*  HGB 9.7* 9.3* 8.8* 8.5*  HCT 30.7* 28.9* 27.0* 26.2*  MCV 84.6 82.6 81.6 80.6  PLT 226 325 264 218    Medications:    . atorvastatin  80 mg Per Tube Daily  . chlorhexidine gluconate (MEDLINE KIT)  15 mL Mouth Rinse BID  . fentaNYL (SUBLIMAZE) injection  50 mcg Intravenous Once  . hydrocortisone sod succinate (SOLU-CORTEF) inj  50 mg Intravenous Q6H  . Influenza vac split quadrivalent PF  0.5 mL Intramuscular Tomorrow-1000  . insulin aspart  0-9 Units Subcutaneous TID WC  .  insulin glargine  10 Units Subcutaneous QHS  . ipratropium-albuterol  3 mL Nebulization BID  . mouth rinse  15 mL Mouth Rinse 10 times per day  . pantoprazole sodium  40 mg Per Tube Daily  . sodium chloride flush  3 mL Intravenous Q12H    Ralene Ok, MD 09/22/17  I have seen and examined this patient and agree with plan and assessment in the above note with renal recommendations/intervention highlighted.  Will continue with CVVHD and keep even for now.  Will order KUB to r/o ileus.   Broadus John A Yarah Fuente,MD 09/22/2017 9:42 AM

## 2017-09-22 NOTE — Progress Notes (Signed)
ANTICOAGULATION CONSULT NOTE - Follow Up Consult  Pharmacy Consult for Heparin Indication: atrial fibrillation  Allergies  Allergen Reactions  . Penicillins Hives, Nausea And Vomiting, Swelling and Other (See Comments)    Has patient had a PCN reaction causing immediate rash, facial/tongue/throat swelling, SOB or lightheadedness with hypotension: YES Has patient had a PCN reaction causing severe rash involving mucus membranes or skin necrosis: No Has patient had a PCN reaction that required hospitalization No Has patient had a PCN reaction occurring within the last 10 years: No If all of the above answers are "NO", then may proceed with Cephalosporin use.     Patient Measurements: Height: 5' (152.4 cm) Weight: 173 lb 1 oz (78.5 kg) IBW/kg (Calculated) : 50 Heparin Dosing Weight:    Vital Signs: Temp: 97.6 F (36.4 C) (02/15 0800) Temp Source: Axillary (02/15 0800) BP: 109/61 (02/15 0830) Pulse Rate: 58 (02/15 0839)  Labs: Recent Labs    09/20/17 0345 09/21/17 0426 09/21/17 1300 09/21/17 1555 09/22/17 0350 09/22/17 0355  HGB 9.3* 8.8*  --   --  8.5*  --   HCT 28.9* 27.0*  --   --  26.2*  --   PLT 325 264  --   --  218  --   HEPARINUNFRC 0.33 0.17* 0.30  --   --  0.72*  CREATININE 4.44* 4.94*  --  4.16* 2.80*  --     Estimated Creatinine Clearance: 23.5 mL/min (A) (by C-G formula based on SCr of 2.8 mg/dL (H)).  Assessment:  Anticoag: Xarelto 15mg  PTA for Afib per med rec, cards note says pt is NOT on anticoag 2/2 GIB in past. Regardless plans are for [facility] once procedures completed (likely apixaban)  Heparin level has trended up this morning to 0.72 just above upper limit of goal (heparin in CRRT may be influencing levels). No bleeding issues noted, hgb low but stable overnight. Discussed rate change with nurse.   Goal of Therapy:  Heparin level 0.3-0.7 units/ml Monitor platelets by anticoagulation protocol: Yes   Plan:  Decrease IV heparin to 1450  units/hr Daily HL and CBC  Erin Hearing PharmD., BCPS Clinical Pharmacist 09/22/2017 8:51 AM

## 2017-09-22 NOTE — Progress Notes (Signed)
Dr. Caryl Comes updated on pt's status - he reviewed pt's CXR to verify temporary transvenous pacer location. Per MD, pacer is in correct location and should be left in for now. If pacing were necessary, Dr. Caryl Comes informed RN to page Cardiology Resident for assistance. Will implement and continue to monitor pt closely.

## 2017-09-22 NOTE — Progress Notes (Signed)
PULMONARY / CRITICAL CARE MEDICINE   Name: CHEYENNE SCHUMM MRN: [id number] DOB: [birth year]    ADMISSION DATE:  09/15/2017 CONSULTATION DATE: 09/18/2017  REFERRING MD: Neva Seat  Reason for consult: hypotension and worsening aA gradient  HISTORY OF PRESENT ILLNESS:        This is a 62 year old diabetic with a history of coronary disease and congestive heart failure with a baseline ejection fraction of 40-50%.  He was admitted on 2/8 complaining of generalized weakness and was found to be in atrial fibrillation with a rapid ventricular response with intermittent 7-8-second pauses.  A temporary pacing wire was placed.  He was noted to have infiltrates or CHF on his chest x-ray.  PCR for influenza a was positive.  He was started on a combination of Tamiflu as well as clindamycin and aztreonam due to a penicillin allergy.  He was diuresed but diuretics are currently being held as his creatinine has risen from approximately 0.9-2.5.  I was called today for hypotension and increasing oxygen requirements.  On my arrival the patient's mental status is very poor.  He is extremely lethargic and not giving any significant history.  He has had a nonproductive cough.  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  SUBJECTIVE:   Started on CVVH Remains intubated, off Neo-Synephrine  VITAL SIGNS: BP (!) 106/54   Pulse 64   Temp 97.6 F (36.4 C) (Axillary)   Resp (!) 28   Ht 5' (1.524 m)   Wt 173 lb 1 oz (78.5 kg)   SpO2 97%   BMI 33.80 kg/m   HEMODYNAMICS: CVP:  [5 mmHg-6 mmHg] 5 mmHg  VENTILATOR SETTINGS: Vent Mode: PRVC FiO2 (%):  [30 %-40 %] 30 % Set Rate:  [28 bmp] 28 bmp Vt Set:  [400 mL] 400 mL PEEP:  [5 cmH20] 5 cmH20 Plateau Pressure:  [18 cmH20-19 cmH20] 19 cmH20  INTAKE / OUTPUT: I/O last 3 completed shifts: In: 4624.8 [I.V.:3144.8; NG/GT:30; IV [id number]:5465] Out: 0354 [Urine:175; Emesis/NG output:1150;  Other:2033]  PHYSICAL EXAMINATION:   Gen:      No acute distress, chronically ill-appearing HEENT:  EOMI, sclera anicteric Neck:     No masses; no thyromegaly Lungs:    Clear to auscultation bilaterally; normal respiratory effort CV:         Irregular Abd:      + bowel sounds; soft, non-tender; no palpable masses, no distension Ext:    Right metatarsal amputation Skin:      Warm and dry; no rash Neuro: Sedated, unresponsive  LABS:  BMET Recent Labs  Lab 09/21/17 0426 09/21/17 1555 09/22/17 0350  NA 127* 130* 133*  K 3.6 3.7 4.0  CL 93* 97* 100*  CO2 17* 18* 19*  BUN 80* 65* 43*  CREATININE 4.94* 4.16* 2.80*  GLUCOSE 147* 154* 159*    Electrolytes Recent Labs  Lab 09/15/17 1639  09/21/17 0426 09/21/17 1555 09/22/17 0350  CALCIUM  --    < > 7.6* 7.4* 7.8*  MG 1.7  --  1.4*  --  2.2  PHOS  --   --  6.4* 6.0* 4.7*   < > = values in this interval not displayed.    CBC Recent Labs  Lab 09/20/17 0345 09/21/17 0426 09/22/17 0350  WBC 23.2* 20.3* 11.9*  HGB 9.3* 8.8* 8.5*  HCT 28.9* 27.0* 26.2*  PLT 325 264 218    Coag's No results for input(s): APTT,  INR in the last 168 hours.  Sepsis Markers Recent Labs  Lab 09/18/17 1520 09/18/17 2051 09/19/17 0328 09/20/17 0345  LATICACIDVEN 1.2 1.3  --   --   PROCALCITON 1.54  --  1.85 12.09    ABG Recent Labs  Lab 09/20/17 1159 09/21/17 0449 09/22/17 0355  PHART 7.341* 7.329* 7.368  PCO2ART 36.0 38.1 38.7  PO2ART 108.0 100 90.8    Liver Enzymes Recent Labs  Lab 09/21/17 1555 09/22/17 0350  AST  --  48*  ALT  --  15*  ALKPHOS  --  60  BILITOT  --  0.5  ALBUMIN 1.5* 1.6*    Cardiac Enzymes Recent Labs  Lab 09/16/17 1159 09/16/17 1754 09/17/17 0436  TROPONINI 0.14* 0.17* 0.16*    Glucose Recent Labs  Lab 09/21/17 0739 09/21/17 1159 09/21/17 1626 09/21/17 2144 09/22/17 0356 09/22/17 0744  GLUCAP 121* 130* 118* 147* 145* 83    Imaging Dg Chest Port 1 View  Result Date:  09/22/2017 CLINICAL DATA:  Respiratory failure. EXAM: PORTABLE CHEST 1 VIEW COMPARISON:  09/21/2017. FINDINGS: Endotracheal tube, NG tube, left IJ line, right IJ line, femoral pacer in stable position. Prior CABG. Stable cardiomegaly. Persistent but improved bilateral interstitial prominence noted. Findings suggest persistent but improving CHF. No prominent pleural effusion or pneumothorax. Surgical clips are noted in the neck. IMPRESSION: 1.  Lines and tubes in stable position.  No pneumothorax. 2. Prior CABG. Stable cardiomegaly. Persistent but improved bilateral interstitial prominence noted. Findings suggest persistent but improving CHF. Electronically Signed   By: Marcello Moores  Register   On: 09/22/2017 07:36   Dg Chest Port 1 View  Result Date: 09/21/2017 CLINICAL DATA:  Central line placement. EXAM: PORTABLE CHEST 1 VIEW COMPARISON:  09/21/2017 FINDINGS: Endotracheal tube terminates approximately 3 cm above the carina. A new right jugular catheter terminates over the mid SVC. Left jugular catheter terminates over the upper SVC, unchanged. Enteric tube courses into the left upper abdomen with tip not imaged. Sequelae of prior CABG are again identified. The cardiomediastinal silhouette is unchanged. There is mild central pulmonary vascular congestion. Increased interstitial markings in both lungs appear slightly increased. Retrocardiac opacity in the left lower lobe has increased. No sizable pleural effusion or pneumothorax is identified. IMPRESSION: 1. New right jugular catheter terminates over the mid SVC. 2. Slightly increased interstitial opacity bilaterally, likely mild edema. 3. Increased left basilar opacity which may reflect atelectasis. Electronically Signed   By: Logan Bores M.D.   On: 09/21/2017 11:10    ANTIBIOTICS: Tamiflu 2/8 > 2/12  Cefepime 2/12 >  Linezolid 2/12 >  Clinda 2/11 > 2/12   LINES/TUBES: There is a left femoral introducer through which a pacing wire has been passed. ETT  2/12 >  L brachial art line 2/12 >  L IJ CVL 2/12 >   DISCUSSION:  This is a 62 year old diabetic with a history of LV dysfunction secondary to coronary artery disease as well as paroxysmal atrial fibrillation who presented with weakness, A. fib with rapid ventricular response, and intermittent pauses.  He has been found to be influenza A positive.  He has been placed on amiodarone and a temporary pacing wire has been placed. Respiratory status deteriorated 2/12; therefore, he required intubation.  ASSESSMENT / PLAN: Acute hypoxemic respiratory failure with bilateral pulmonary infiltrates Flu a infection, bacterial pneumonia. -Full vent support, no SBT's today as he still unstable -Follow chest x-ray -Continue Zyvox and cefepime  Shock: etiology unclear. Likely combo cardiogenic, septic (PCT 12), hypovolemia (CVP  6 - 9). Lactic acid WNL. Concern he could actually be volume down. He is also on chronic florinef, with a cortisol of 18. May not be supplementing this effectively.  - Levophed to keep MAP > 65 mmHg - D/c florinef, switch to stress dose steroids given minimal improvement - d/c metoprolol  Septic, cardiogenic shock Atrial fibrillation with Tachy/Brady syndrome: Temp pacer in place. Plans for PPM but currently too ill.  - Continue temp pacer for now - Continue amiodarone and heparin infusion - Off Neo since today AM  AKI - oliguric AGMA - ?uremia Hypomagnesemia - On CVVH, keep evem  Anemia - chronic - Transfuse for Hgb < 7 - Follow CBC  DM - CBG monitoring and SSI  Acute metabolic encephalopathy - with sedation needs due to mechanical ventilation - Sedation:  Fentanyl gtt / precedex gtt / versed PRN -RASS goal: 0 to -1  Abdominal distention, large ventral hernia -Checking abdominal x-ray.  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 Will need ENT consult once improved.   Family updated in full at bedside 09/22/17.  Prognosis is guarded.  The patient  is critically ill with multiple organ system failure and requires high complexity decision making for assessment and support, frequent evaluation and titration of therapies, advanced monitoring, review of radiographic studies and interpretation of complex data.   Critical Care Time devoted to patient care services, exclusive of separately billable procedures, described in this note is 35 minutes.   Marshell Garfinkel MD [facility] Pager [phone] If no answer or after 3pm call: [phone] 09/22/2017, 11:06 AM

## 2017-09-22 NOTE — Consult Note (Addendum)
[place] Nurse wound consult note Reason for Consult: DTI sacrum and skin breakdown  Wound types and locations:  Medial gluteal fold:  MASD versus pressure injury.  Purple discoloration extending from the coccyx distally and bilaterally along the gluteal tissue in an area where the tissues press against each other.  Site currently covered with sacral foam dressing.  Area measures 6.0 cm x 0.9 cm.  No open areas or slough noted. Plan for this site:  Continue use of sacral foam dressing.  Change every 3 days and prn soilage.  Also, turn patient side-to-side and avoid positioning patient supine to the greatest extent possible.  Patient is in a ICU where an air mattresses is already in use.  Lower anterior legs:  Multiple, scattered, small, blackened dry skin areas.  Surrounding skin flaking and dry.   Plan for lower legs:  Eucerin cream twice daily, avoid placing between toes.  Abdominal pannus:  Intertriginous dermatitis in fold with scattered fissures. Plan for pannus:  InterDry Ag+ antimicrobial fabric.  Kellie Simmering # [phone].  Currently patient is on vasopressors and receiving continuous veno-venous hemodiafiltration.  Discussed POC with bedside nurse.  Re consult if needed, will not follow at this time. Thank you, Val Riles MSN,RN,CWOCN,CNS-BC, [phone])

## 2017-09-23 ENCOUNTER — Inpatient Hospital Stay (HOSPITAL_COMMUNITY): Payer: Medicare (Managed Care)

## 2017-09-23 LAB — CBC
HEMATOCRIT: 24.5 % — AB (ref 39.0–52.0)
Hemoglobin: 7.9 g/dL — ABNORMAL LOW (ref 13.0–17.0)
MCH: 26.4 pg (ref 26.0–34.0)
MCHC: 32.2 g/dL (ref 30.0–36.0)
MCV: 81.9 fL (ref 78.0–100.0)
PLATELETS: 230 10*3/uL (ref 150–400)
RBC: 2.99 MIL/uL — ABNORMAL LOW (ref 4.22–5.81)
RDW: 15.5 % (ref 11.5–15.5)
WBC: 10.3 10*3/uL (ref 4.0–10.5)

## 2017-09-23 LAB — GLUCOSE, CAPILLARY
GLUCOSE-CAPILLARY: 131 mg/dL — AB (ref 65–99)
GLUCOSE-CAPILLARY: 152 mg/dL — AB (ref 65–99)
GLUCOSE-CAPILLARY: 143 mg/dL — AB (ref 65–99)
Glucose-Capillary: 136 mg/dL — ABNORMAL HIGH (ref 65–99)
Glucose-Capillary: 146 mg/dL — ABNORMAL HIGH (ref 65–99)
Glucose-Capillary: 149 mg/dL — ABNORMAL HIGH (ref 65–99)
Glucose-Capillary: 152 mg/dL — ABNORMAL HIGH (ref 65–99)

## 2017-09-23 LAB — RENAL FUNCTION PANEL
ALBUMIN: 1.7 g/dL — AB (ref 3.5–5.0)
Albumin: 1.6 g/dL — ABNORMAL LOW (ref 3.5–5.0)
Anion gap: 15 (ref 5–15)
Anion gap: 13 (ref 5–15)
BUN: 25 mg/dL — ABNORMAL HIGH (ref 6–20)
BUN: 21 mg/dL — AB (ref 6–20)
CALCIUM: 8.1 mg/dL — AB (ref 8.9–10.3)
CHLORIDE: 103 mmol/L (ref 101–111)
CO2: 20 mmol/L — ABNORMAL LOW (ref 22–32)
CO2: 20 mmol/L — ABNORMAL LOW (ref 22–32)
CREATININE: 1.89 mg/dL — AB (ref 0.61–1.24)
CREATININE: 1.71 mg/dL — AB (ref 0.61–1.24)
Calcium: 7.9 mg/dL — ABNORMAL LOW (ref 8.9–10.3)
Chloride: 103 mmol/L (ref 101–111)
GFR calc Af Amer: 47 mL/min — ABNORMAL LOW (ref >60)
GFR calc non Af Amer: 41 mL/min — ABNORMAL LOW (ref >60)
GFR, EST AFRICAN AMERICAN: 42 mL/min — AB (ref >60)
GFR, EST NON AFRICAN AMERICAN: 36 mL/min — AB (ref >60)
Glucose, Bld: 151 mg/dL — ABNORMAL HIGH (ref 65–99)
Glucose, Bld: 151 mg/dL — ABNORMAL HIGH (ref 65–99)
PHOSPHORUS: 2.2 mg/dL — AB (ref 2.5–4.6)
Phosphorus: 2.8 mg/dL (ref 2.5–4.6)
Potassium: 3.8 mmol/L (ref 3.5–5.1)
Potassium: 3.7 mmol/L (ref 3.5–5.1)
SODIUM: 136 mmol/L (ref 135–145)
Sodium: 138 mmol/L (ref 135–145)

## 2017-09-23 LAB — MAGNESIUM: MAGNESIUM: 2.3 mg/dL (ref 1.7–2.4)

## 2017-09-23 LAB — CULTURE, BLOOD (ROUTINE X 2)
CULTURE: NO GROWTH
Culture: NO GROWTH
SPECIAL REQUESTS: ADEQUATE
SPECIAL REQUESTS: ADEQUATE

## 2017-09-23 LAB — HEPARIN LEVEL (UNFRACTIONATED): HEPARIN UNFRACTIONATED: 0.71 [IU]/mL — AB (ref 0.30–0.70)

## 2017-09-23 MED ORDER — AMIODARONE HCL IN DEXTROSE 360-4.14 MG/200ML-% IV SOLN
60.0000 mg/h | INTRAVENOUS | Status: AC
  Administered 2017-09-23 (×2): 60 mg/h via INTRAVENOUS
  Filled 2017-09-23 (×2): qty 200

## 2017-09-23 MED ORDER — DEXTROSE 50 % IV SOLN
0.5000 | Freq: Once | INTRAVENOUS | Status: DC
  Filled 2017-09-23: qty 50

## 2017-09-23 MED ORDER — AMIODARONE LOAD VIA INFUSION
150.0000 mg | Freq: Once | INTRAVENOUS | Status: AC
  Administered 2017-09-23: 150 mg via INTRAVENOUS
  Filled 2017-09-23: qty 83.34

## 2017-09-23 MED ORDER — AMIODARONE HCL IN DEXTROSE 360-4.14 MG/200ML-% IV SOLN
60.0000 mg/h | INTRAVENOUS | Status: DC
  Administered 2017-09-23 – 2017-09-24 (×3): 30 mg/h via INTRAVENOUS
  Administered 2017-09-25: 60 mg/h via INTRAVENOUS
  Administered 2017-09-25: 30 mg/h via INTRAVENOUS
  Administered 2017-09-25 – 2017-09-28 (×11): 60 mg/h via INTRAVENOUS
  Filled 2017-09-23 (×12): qty 200

## 2017-09-23 NOTE — Progress Notes (Addendum)
ANTICOAGULATION CONSULT NOTE - Follow Up Consult  Pharmacy Consult for Heparin Indication: atrial fibrillation  Allergies  Allergen Reactions  . Penicillins Hives, Nausea And Vomiting, Swelling and Other (See Comments)    Has patient had a PCN reaction causing immediate rash, facial/tongue/throat swelling, SOB or lightheadedness with hypotension: YES Has patient had a PCN reaction causing severe rash involving mucus membranes or skin necrosis: No Has patient had a PCN reaction that required hospitalization No Has patient had a PCN reaction occurring within the last 10 years: No If all of the above answers are "NO", then may proceed with Cephalosporin use.     Patient Measurements: Height: 5' (152.4 cm) Weight: 171 lb 8.3 oz (77.8 kg) IBW/kg (Calculated) : 50 Heparin Dosing Weight:    Vital Signs: Temp: 97.3 F (36.3 C) (02/16 0800) Temp Source: Esophageal (02/16 0800) BP: 102/55 (02/16 0800) Pulse Rate: 68 (02/16 0800)  Labs: Recent Labs    09/21/17 0426 09/21/17 1300  09/22/17 0350 09/22/17 0355 09/22/17 1617 09/23/17 0359  HGB 8.8*  --   --  8.5*  --   --  7.9*  HCT 27.0*  --   --  26.2*  --   --  24.5*  PLT 264  --   --  218  --   --  230  HEPARINUNFRC 0.17* 0.30  --   --  0.72*  --  0.71*  CREATININE 4.94*  --    < > 2.80*  --  2.28* 1.89*   < > = values in this interval not displayed.    Estimated Creatinine Clearance: 34.6 mL/min (A) (by C-G formula based on SCr of 1.89 mg/dL (H)).  Assessment:  Anticoag: Xarelto 15mg  PTA for Afib per med rec, cards note says pt is NOT on anticoag 2/2 GIB in past. Regardless plans are for [facility] once procedures completed (likely apixaban)  Heparin level continues to stay elevated despite modest rate change yesterday. No bleeding issues noted, hgb down to 7.9 overnight. Discussed rate change with nurse.   Infectious disease: patient continues on cefepime and zyvox. Dosing adjusted for CRRT. Afeb, wbc 10. Pltc improved this am to  230. Will continue to follow cbc closely on zyvox. Cultures no growth to date  2/12 zyvox>> 2/12 cefepime>> 2/11 clinda >>2/12 2/11 azactam>> 2/12 2/8 tamifu>> 2/12, restarted 2/13>2/13  2/8 blood x2- neg 2/11 blood x2- ngtd  Goal of Therapy:  Heparin level 0.3-0.7 units/ml Monitor platelets by anticoagulation protocol: Yes   Plan:  Decrease IV heparin to 1300 units/hr Daily HL and CBC No change in antibiotic dosing while on CRRT  Erin Hearing PharmD., BCPS Clinical Pharmacist 09/23/2017 8:27 AM

## 2017-09-23 NOTE — Progress Notes (Signed)
Spoke with Dr. Caryl Comes and updated him on pts 250 beat VT episode as well as pt being in [facility] now already on amio gtt. MD verbal order to re-bolus amio and start the infusion back at 60 for 6 hrs, then decrease to 30. Will administer and monitor closely.  Lucius Conn, RN

## 2017-09-23 NOTE — Progress Notes (Signed)
PULMONARY / CRITICAL CARE MEDICINE   Name: SAJAD GLANDER MRN: [id number] DOB: 03-26-1956    ADMISSION DATE:  09/15/2017 CONSULTATION DATE: 09/18/2017  REFERRING MD: Neva Seat  Reason for consult: hypotension and worsening aA gradient  HISTORY OF PRESENT ILLNESS:        This is a 62 year old diabetic with a history of coronary disease and congestive heart failure with a baseline ejection fraction of 40-50%.  He was admitted on 2/8 complaining of generalized weakness and was found to be in atrial fibrillation with a rapid ventricular response with intermittent 7-8-second pauses.  A temporary pacing wire was placed.  He was noted to have infiltrates or CHF on his chest x-ray.  PCR for influenza a was positive.  He was started on a combination of Tamiflu as well as clindamycin and aztreonam due to a penicillin allergy.  He was diuresed but diuretics are currently being held as his creatinine has risen from approximately 0.9-2.5.  I was called today for hypotension and increasing oxygen requirements.  On my arrival the patient's mental status is very poor.  He is extremely lethargic and not giving any significant history.  He has had a nonproductive cough.  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  SUBJECTIVE:   Remains  on CVVH>> Keeping even Remains intubated, on Neo at present for  onset a fib with RVR  after ? VT for which patient was asymptomatic. Lytes all WNL  VITAL SIGNS: BP (!) 102/55   Pulse 67   Temp 97.7 F (36.5 C)   Resp 20   Ht 5' (1.524 m)   Wt 171 lb 8.3 oz (77.8 kg)   SpO2 100%   BMI 33.50 kg/m   HEMODYNAMICS: CVP:  [2 mmHg-8 mmHg] 2 mmHg  VENTILATOR SETTINGS: Vent Mode: PRVC FiO2 (%):  [30 %] 30 % Set Rate:  [28 bmp] 28 bmp Vt Set:  [400 mL] 400 mL PEEP:  [5 cmH20] 5 cmH20 Plateau Pressure:  [17 cmH20-19 cmH20] 17 cmH20  INTAKE / OUTPUT: I/O last 3 completed shifts: In: 3919.8  [I.V.:2599.8; NG/GT:120; IV Piggyback:1200] Out: 9924 [Urine:72; Emesis/NG output:450; QASTM:1962]  PHYSICAL EXAMINATION:   Gen:     Intubated sedated male on CVVH, follows simple commands HEENT:  EOMI, sclera anicteric Neck:     No masses; no thyromegaly, No JVD Lungs:    Clear to auscultation bilaterally; Intubated and on full vent support CV:         A fib per monitor, temp pacer , run VT early am Abd:      + bowel sounds; soft, non-tender; no palpable masses, no distension Ext:    Right metatarsal amputation, otherwise warm dry and intact Skin:      Warm and dry; no rash, pale Neuro:  Sedated, awakens easily and follows commands, MAE x 4  LABS:  BMET Recent Labs  Lab 09/22/17 0350 09/22/17 1617 09/23/17 0359  NA 133* 139 138  K 4.0 3.8 3.8  CL 100* 104 103  CO2 19* 21* 20*  BUN 43* 33* 25*  CREATININE 2.80* 2.28* 1.89*  GLUCOSE 159* 140* 151*    Electrolytes Recent Labs  Lab 09/21/17 0426  09/22/17 0350 09/22/17 1617 09/23/17 0359  CALCIUM 7.6*   < > 7.8* 7.8* 8.1*  MG 1.4*  --  2.2  --  2.3  PHOS 6.4*   < > 4.7* 3.3 2.8   < > =  values in this interval not displayed.    CBC Recent Labs  Lab 09/21/17 0426 09/22/17 0350 09/23/17 0359  WBC 20.3* 11.9* 10.3  HGB 8.8* 8.5* 7.9*  HCT 27.0* 26.2* 24.5*  PLT 264 218 230    Coag's No results for input(s): APTT, INR in the last 168 hours.  Sepsis Markers Recent Labs  Lab 09/18/17 1520 09/18/17 2051 09/19/17 0328 09/20/17 0345  LATICACIDVEN 1.2 1.3  --   --   PROCALCITON 1.54  --  1.85 12.09    ABG Recent Labs  Lab 09/20/17 1159 09/21/17 0449 09/22/17 0355  PHART 7.341* 7.329* 7.368  PCO2ART 36.0 38.1 38.7  PO2ART 108.0 100 90.8    Liver Enzymes Recent Labs  Lab 09/22/17 0350 09/22/17 1617 09/23/17 0359  AST 48*  --   --   ALT 15*  --   --   ALKPHOS 60  --   --   BILITOT 0.5  --   --   ALBUMIN 1.6* 1.5* 1.6*    Cardiac Enzymes Recent Labs  Lab 09/16/17 1159 09/16/17 1754  09/17/17 0436  TROPONINI 0.14* 0.17* 0.16*    Glucose Recent Labs  Lab 09/22/17 1203 09/22/17 1643 09/22/17 2100 09/23/17 0014 09/23/17 0410 09/23/17 0750  GLUCAP 124* 133* 131* 146* 149* 136*    Imaging Dg Chest Port 1 View  Result Date: 09/23/2017 CLINICAL DATA:  Acute respiratory failure EXAM: PORTABLE CHEST 1 VIEW COMPARISON:  09/22/2017 FINDINGS: Endotracheal tube in good position. Bilateral central venous catheter tips in the SVC unchanged. No pneumothorax. Bibasilar airspace disease unchanged. Minimal pleural effusion bilaterally. NG tube enters the stomach. IMPRESSION: Bibasilar airspace disease unchanged. Possible pneumonia in the bases. Electronically Signed   By: Franchot Gallo M.D.   On: 09/23/2017 07:09   Dg Chest Port 1 View  Result Date: 09/22/2017 CLINICAL DATA:  62 year old male undergoing temporary pacemaker placement. EXAM: PORTABLE CHEST 1 VIEW COMPARISON:  0533 hr today and earlier. FINDINGS: Portable AP semi upright view at 1758 hr. An IVC approach cardiac pacemaker lead appears in stable position from earlier today coursing to the expected location of the RV apex (arrow). Endotracheal tube tip is at the clavicle level. Enteric tube courses to the abdomen, tip not included. Stable right IJ dual lumen central line. Stable left IJ smaller central line. Stable cardiac size and mediastinal contours. Patchy and confluent lung base opacity is greater on the left and not significantly changed. No pneumothorax. Stable pulmonary vascularity. Possible small left pleural effusion. IMPRESSION: 1. Stable IVC approach cardiac pacemaker lead from earlier today, coursing to the expected location of the RV apex. 2.  Otherwise stable lines and tubes. 3. Stable ventilation with left greater than right lung base atelectasis or consolidation, possible small left pleural effusion, and pulmonary interstitial edema. Electronically Signed   By: Genevie Ann M.D.   On: 09/22/2017 18:28   Dg Abd  Portable 1v  Result Date: 09/22/2017 CLINICAL DATA:  Abdominal distention. EXAM: PORTABLE ABDOMEN - 1 VIEW COMPARISON:  CT 02/18/2010. FINDINGS: NG tube noted with its tip over the lower mid abdomen. This is most likely in the a distended stomach. There is a paucity of distal bowel gas. Femoral pacer noted with tip over the right ventricle. Prior CABG. Degenerative changes scoliosis lumbar spine. Basilar atelectasis. IMPRESSION: 1. NG tube noted over the lower abdomen. This is most likely in a distended stomach. There is a paucity of distal bowel gas. The possibility of gastric obstruction should be considered. 2. Femoral pacing  wire noted with lead tip over the right ventricle. Prior CABG. 3.  Basilar atelectasis. Electronically Signed   By: Marcello Moores  Register   On: 09/22/2017 12:07    ANTIBIOTICS: Tamiflu 2/8 > 2/12  Cefepime 2/12 >  Linezolid 2/12 >  Clinda 2/11 > 2/12 2/11 azactam>> 2/12   LINES/TUBES: There is a left femoral introducer through which a pacing wire has been passed. ETT 2/12 >  L brachial art line 2/12 >  L IJ CVL 2/12 >   DISCUSSION:  This is a 62 year old diabetic with a history of LV dysfunction secondary to coronary artery disease as well as paroxysmal atrial fibrillation who presented with weakness, A. fib with rapid ventricular response, and intermittent pauses.  He has been found to be influenza A positive.  He has been placed on amiodarone and a temporary pacing wire has been placed. Respiratory status deteriorated 2/12; therefore, he required intubation.  ASSESSMENT / PLAN: Acute hypoxemic respiratory failure with bilateral pulmonary infiltrates Flu a infection, bacterial pneumonia. CXR 2/16>> Bibasilar airspace disease unchanged. Possible pneumonia in the Bases Plan: - Full vent support, no SBT's today as he is still unstable, a-fib, on pressors at present - Follow chest x-ray - Titrate PEEP and  FiO2 for sats > 94% - Continue Zyvox and cefepime - ABG  prn  Shock: etiology unclear. Likely combo cardiogenic, septic (PCT 12), hypovolemia (CVP 6 - 9). Lactic acid WNL. Concern he could actually be volume down. He is also on chronic florinef, with a cortisol of 18. May not be supplementing this effectively. Plan:  - Neo  to keep MAP > 65 mmHg - D/c florinef, switch to stress dose steroids given minimal improvement - d/c metoprolol  Septic, cardiogenic shock Atrial fibrillation with Tachy/Brady syndrome: Temp pacer in place. Plans for PPM but currently too ill.  Episode of VT 2/16 am, pt asymptomatic, self resolved>> a fib with RVR>> resumed Neo All lytes WNL Plan: - Continue temp pacer for now with plans for perm once stable - Continue amiodarone>> consider bolus - Continue heparin infusion per pharmacy - Neo restarted with episode of a fib 2/16 am - EKG prn  AKI - oliguric AGMA - ?uremia Hypomagnesemia>> resolved - On CVVH, keep even per renal - replete electrolytes as needed - Monitor BMET  Anemia - chronic - Transfuse for Hgb < 7 - Follow CBC  DM - CBG monitoring and SSI  Acute metabolic encephalopathy - with sedation needs due to mechanical ventilation - Sedation:  Fentanyl gtt / precedex gtt / versed PRN -RASS goal: 0 to -1  Abdominal distention, large ventral hernia Plan: -Checking abdominal x-ray.>>  most likely in a distended stomach. There is a paucity of distal bowel gas. The possibility of gastric obstruction should be considered. - Continue OG suction - Decreased bilious output last 24 - Per night nurse, passing gas - Repeat KUB 2/17 - Will need to address nutrition if this does not resolve  2. Femoral pacing wire noted with lead tip over the right ventricle. Prior CABG.  3.  Basilar atelectasis.   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 Will need ENT consult once improved.   No family at bedside 2/16.  Prognosis is guarded.  APP time 35 minutes  Magdalen Spatz, AGACNP-BC [place]  Pulmonary and Critical Care Pager : [phone] 09/23/2017, 8:58 AM

## 2017-09-23 NOTE — Plan of Care (Signed)
  Progressing Clinical Measurements: Will remain free from infection 09/23/2017 0124 - Progressing by Alonna Buckler, RN Diagnostic test results will improve 09/23/2017 0124 - Progressing by Alonna Buckler, RN Respiratory complications will improve 09/23/2017 0124 - Progressing by Alonna Buckler, RN Cardiovascular complication will be avoided 09/23/2017 0124 - Progressing by Alonna Buckler, RN Elimination: Will not experience complications related to bowel motility 09/23/2017 0124 - Progressing by Alonna Buckler, RN Will not experience complications related to urinary retention 09/23/2017 0124 - Progressing by Alonna Buckler, RN Pain Managment: General experience of comfort will improve 09/23/2017 0124 - Progressing by Alonna Buckler, RN Safety: Ability to remain free from injury will improve 09/23/2017 0124 - Progressing by Alonna Buckler, RN Skin Integrity: Risk for impaired skin integrity will decrease 09/23/2017 0124 - Progressing by Alonna Buckler, RN Cardiac: Ability to achieve and maintain adequate cardiopulmonary perfusion will improve 09/23/2017 0124 - Progressing by Alonna Buckler, RN Clinical Measurements: Complications related to the disease process or treatment will be avoided or minimized 09/23/2017 0124 - Progressing by Alonna Buckler, RN Dialysis access will remain free of complications [id number]/[id number] - Progressing by Alonna Buckler, RN Fluid Volume: Fluid volume balance will be maintained or improved 09/23/2017 0124 - Progressing by Alonna Buckler, RN Respiratory: Respiratory symptoms related to disease process will be avoided 09/23/2017 0124 - Progressing by Alonna Buckler, RN Urinary Elimination: Progression of disease will be identified and treated 09/23/2017 0124 - Progressing by Alonna Buckler, RN

## 2017-09-23 NOTE — Progress Notes (Signed)
S: tolerating CVVHD well O:BP 119/66   Pulse 98   Temp (!) 97.3 F (36.3 C)   Resp (!) 28   Ht 5' (1.524 m)   Wt 77.8 kg (171 lb 8.3 oz)   SpO2 100%   BMI 33.50 kg/m   Intake/Output Summary (Last 24 hours) at 09/23/2017 0942 Last data filed at 09/23/2017 0800 Gross per 24 hour  Intake 2297.63 ml  Output 2830 ml  Net -532.37 ml   Intake/Output: I/O last 3 completed shifts: In: 3919.8 [I.V.:2599.8; NG/GT:120; IV Piggyback:1200] Out: 9147 [Urine:72; Emesis/NG output:450; WGNFA:2130]  Intake/Output this shift:  Total I/O In: 58 [I.V.:58] Out: 80 [Other:80] Weight change: -0.7 kg (-8.7 oz) QMV:[id number] CVS: no rub Resp: cta Abd: benign Ext: trace edema  Recent Labs  Lab 09/19/17 0328 09/20/17 0345 09/21/17 0426 09/21/17 1555 09/22/17 0350 09/22/17 1617 09/23/17 0359  NA 133* 128* 127* 130* 133* 139 138  K 4.2 3.7 3.6 3.7 4.0 3.8 3.8  CL 93* 91* 93* 97* 100* 104 103  CO2 24 20* 17* 18* 19* 21* 20*  GLUCOSE 130* 217* 147* 154* 159* 140* 151*  BUN 69* 78* 80* 65* 43* 33* 25*  CREATININE 3.35* 4.44* 4.94* 4.16* 2.80* 2.28* 1.89*  ALBUMIN  --   --   --  1.5* 1.6* 1.5* 1.6*  CALCIUM 8.2* 8.0* 7.6* 7.4* 7.8* 7.8* 8.1*  PHOS  --   --  6.4* 6.0* 4.7* 3.3 2.8  AST  --   --   --   --  48*  --   --   ALT  --   --   --   --  15*  --   --    Liver Function Tests: Recent Labs  Lab 09/22/17 0350 09/22/17 1617 09/23/17 0359  AST 48*  --   --   ALT 15*  --   --   ALKPHOS 60  --   --   BILITOT 0.5  --   --   PROT 6.2*  --   --   ALBUMIN 1.6* 1.5* 1.6*   No results for input(s): LIPASE, AMYLASE in the last 168 hours. No results for input(s): AMMONIA in the last 168 hours. CBC: Recent Labs  Lab 09/19/17 0328 09/20/17 0345 09/21/17 0426 09/22/17 0350 09/23/17 0359  WBC 13.7* 23.2* 20.3* 11.9* 10.3  HGB 9.7* 9.3* 8.8* 8.5* 7.9*  HCT 30.7* 28.9* 27.0* 26.2* 24.5*  MCV 84.6 82.6 81.6 80.6 81.9  PLT 226 325 264 218 230   Cardiac Enzymes: Recent Labs  Lab  09/16/17 1159 09/16/17 1754 09/17/17 0436  TROPONINI 0.14* 0.17* 0.16*   CBG: Recent Labs  Lab 09/22/17 1643 09/22/17 2100 09/23/17 0014 09/23/17 0410 09/23/17 0750  GLUCAP 133* 131* 146* 149* 136*    Iron Studies: No results for input(s): IRON, TIBC, TRANSFERRIN, FERRITIN in the last 72 hours. Studies/Results: Dg Chest Port 1 View  Result Date: 09/23/2017 CLINICAL DATA:  Acute respiratory failure EXAM: PORTABLE CHEST 1 VIEW COMPARISON:  09/22/2017 FINDINGS: Endotracheal tube in good position. Bilateral central venous catheter tips in the SVC unchanged. No pneumothorax. Bibasilar airspace disease unchanged. Minimal pleural effusion bilaterally. NG tube enters the stomach. IMPRESSION: Bibasilar airspace disease unchanged. Possible pneumonia in the bases. Electronically Signed   By: Franchot Gallo M.D.   On: 09/23/2017 07:09   Dg Chest Port 1 View  Result Date: 09/22/2017 CLINICAL DATA:  62 year old male undergoing temporary pacemaker placement. EXAM: PORTABLE CHEST 1 VIEW COMPARISON:  0533 hr today and earlier.  FINDINGS: Portable AP semi upright view at 1758 hr. An IVC approach cardiac pacemaker lead appears in stable position from earlier today coursing to the expected location of the RV apex (arrow). Endotracheal tube tip is at the clavicle level. Enteric tube courses to the abdomen, tip not included. Stable right IJ dual lumen central line. Stable left IJ smaller central line. Stable cardiac size and mediastinal contours. Patchy and confluent lung base opacity is greater on the left and not significantly changed. No pneumothorax. Stable pulmonary vascularity. Possible small left pleural effusion. IMPRESSION: 1. Stable IVC approach cardiac pacemaker lead from earlier today, coursing to the expected location of the RV apex. 2.  Otherwise stable lines and tubes. 3. Stable ventilation with left greater than right lung base atelectasis or consolidation, possible small left pleural effusion, and  pulmonary interstitial edema. Electronically Signed   By: Genevie Ann M.D.   On: 09/22/2017 18:28   Dg Chest Port 1 View  Result Date: 09/22/2017 CLINICAL DATA:  Respiratory failure. EXAM: PORTABLE CHEST 1 VIEW COMPARISON:  09/21/2017. FINDINGS: Endotracheal tube, NG tube, left IJ line, right IJ line, femoral pacer in stable position. Prior CABG. Stable cardiomegaly. Persistent but improved bilateral interstitial prominence noted. Findings suggest persistent but improving CHF. No prominent pleural effusion or pneumothorax. Surgical clips are noted in the neck. IMPRESSION: 1.  Lines and tubes in stable position.  No pneumothorax. 2. Prior CABG. Stable cardiomegaly. Persistent but improved bilateral interstitial prominence noted. Findings suggest persistent but improving CHF. Electronically Signed   By: Marcello Moores  Register   On: 09/22/2017 07:36   Dg Chest Port 1 View  Result Date: 09/21/2017 CLINICAL DATA:  Central line placement. EXAM: PORTABLE CHEST 1 VIEW COMPARISON:  09/21/2017 FINDINGS: Endotracheal tube terminates approximately 3 cm above the carina. A new right jugular catheter terminates over the mid SVC. Left jugular catheter terminates over the upper SVC, unchanged. Enteric tube courses into the left upper abdomen with tip not imaged. Sequelae of prior CABG are again identified. The cardiomediastinal silhouette is unchanged. There is mild central pulmonary vascular congestion. Increased interstitial markings in both lungs appear slightly increased. Retrocardiac opacity in the left lower lobe has increased. No sizable pleural effusion or pneumothorax is identified. IMPRESSION: 1. New right jugular catheter terminates over the mid SVC. 2. Slightly increased interstitial opacity bilaterally, likely mild edema. 3. Increased left basilar opacity which may reflect atelectasis. Electronically Signed   By: Logan Bores M.D.   On: 09/21/2017 11:10   Dg Abd Portable 1v  Result Date: 09/22/2017 CLINICAL DATA:   Abdominal distention. EXAM: PORTABLE ABDOMEN - 1 VIEW COMPARISON:  CT 02/18/2010. FINDINGS: NG tube noted with its tip over the lower mid abdomen. This is most likely in the a distended stomach. There is a paucity of distal bowel gas. Femoral pacer noted with tip over the right ventricle. Prior CABG. Degenerative changes scoliosis lumbar spine. Basilar atelectasis. IMPRESSION: 1. NG tube noted over the lower abdomen. This is most likely in a distended stomach. There is a paucity of distal bowel gas. The possibility of gastric obstruction should be considered. 2. Femoral pacing wire noted with lead tip over the right ventricle. Prior CABG. 3.  Basilar atelectasis. Electronically Signed   By: Marcello Moores  Register   On: 09/22/2017 12:07   . atorvastatin  80 mg Per Tube Daily  . chlorhexidine gluconate (MEDLINE KIT)  15 mL Mouth Rinse BID  . fentaNYL (SUBLIMAZE) injection  50 mcg Intravenous Once  . hydrocerin   Topical BID  .  hydrocortisone sod succinate (SOLU-CORTEF) inj  50 mg Intravenous Q6H  . insulin aspart  0-9 Units Subcutaneous TID WC  . insulin glargine  10 Units Subcutaneous QHS  . ipratropium-albuterol  3 mL Nebulization BID  . mouth rinse  15 mL Mouth Rinse 10 times per day  . pantoprazole sodium  40 mg Per Tube Daily  . sodium chloride flush  3 mL Intravenous Q12H    BMET    Component Value Date/Time   NA 138 09/23/2017 0359   NA 141 10/28/2016 1558   K 3.8 09/23/2017 0359   CL 103 09/23/2017 0359   CO2 20 (L) 09/23/2017 0359   GLUCOSE 151 (H) 09/23/2017 0359   BUN 25 (H) 09/23/2017 0359   BUN 52 (H) 10/28/2016 1558   CREATININE 1.89 (H) 09/23/2017 0359   CREATININE 1.46 (H) 08/04/2015 0828   CALCIUM 8.1 (L) 09/23/2017 0359   GFRNONAA 36 (L) 09/23/2017 0359   GFRAA 42 (L) 09/23/2017 0359   CBC    Component Value Date/Time   WBC 10.3 09/23/2017 0359   RBC 2.99 (L) 09/23/2017 0359   HGB 7.9 (L) 09/23/2017 0359   HCT 24.5 (L) 09/23/2017 0359   PLT 230 09/23/2017 0359   MCV  81.9 09/23/2017 0359   MCH 26.4 09/23/2017 0359   MCHC 32.2 09/23/2017 0359   RDW 15.5 09/23/2017 0359   LYMPHSABS 1.6 10/05/2016 1155   MONOABS 0.6 10/05/2016 1155   EOSABS 0.3 10/05/2016 1155   BASOSABS 0.1 10/05/2016 1155     Assessment/Plan:  1. AKI/CKD stage 3 in setting of severe sepsis syndrome requiring multiple pressors.  He has been oliguric and CVVHD was initiated on 09/21/17 via temp HD catheter.  He has been tolerating CVVHD and we are keeping even for now given ongoing hypotension and pressor dependence 2. VDRF- per PCCM 3. Cardiogenic shock- in setting of sepsis and afib with tachy/brady syndrome 4. Anemia of critical illness 5. Acute metabolic encephalopathy- in setting of sepsis 6. Influenza A  Joseph Potts, MD [facility] [phone]

## 2017-09-23 NOTE — Progress Notes (Signed)
Electrophysiology Rounding Note  Patient Name: [name] Date of Encounter: 09/23/2017  Primary Cardiologist: Nahser Electrophysiologist: Lovena Le   Subjective   Patient intubated unresponsive to voice  Inpatient Medications    Scheduled Meds: . atorvastatin  80 mg Per Tube Daily  . chlorhexidine gluconate (MEDLINE KIT)  15 mL Mouth Rinse BID  . fentaNYL (SUBLIMAZE) injection  50 mcg Intravenous Once  . hydrocerin   Topical BID  . hydrocortisone sod succinate (SOLU-CORTEF) inj  50 mg Intravenous Q6H  . insulin aspart  0-9 Units Subcutaneous TID WC  . insulin glargine  10 Units Subcutaneous QHS  . ipratropium-albuterol  3 mL Nebulization BID  . mouth rinse  15 mL Mouth Rinse 10 times per day  . pantoprazole sodium  40 mg Per Tube Daily  . sodium chloride flush  3 mL Intravenous Q12H   Continuous Infusions: . sodium chloride 250 mL (09/22/17 1036)  . amiodarone 30 mg/hr (09/23/17 0300)  . ceFEPime (MAXIPIME) IV Stopped (09/22/17 2200)  . dexmedetomidine (PRECEDEX) IV infusion 0.4 mcg/kg/hr (09/23/17 0716)  . fentaNYL infusion INTRAVENOUS 50 mcg/hr (09/23/17 0716)  . heparin 1,300 Units/hr (09/23/17 0740)  . heparin    . linezolid (ZYVOX) IV Stopped (09/22/17 2300)  . phenylephrine (NEO-SYNEPHRINE) Adult infusion Stopped (09/23/17 0700)  . dialysis replacement fluid (prismasate) 300 mL/hr at 09/22/17 2110  . dialysis replacement fluid (prismasate) 500 mL/hr at 09/23/17 0445  . dialysate (PRISMASATE) 1,000 mL/hr at 09/23/17 0425   PRN Meds: sodium chloride, acetaminophen, camphor-menthol, fentaNYL, heparin, heparin, [START ON 09/26/2017] Influenza vac split quadrivalent PF, midazolam, nitroGLYCERIN, ondansetron (ZOFRAN) IV, polyvinyl alcohol, senna-docusate, sodium chloride flush   Vital Signs    Vitals:   09/23/17 0700 09/23/17 0715 09/23/17 0725 09/23/17 0730  BP: (!) 84/67   (!) 124/59  Pulse: (!) 55 65  64  Resp: 17 (!) 28  18  Temp: (!) 97.5 F (36.4 C)  97.7 F (36.5 C)  (!) 97.5 F (36.4 C)  TempSrc:    Esophageal  SpO2: 98% 100% 100% 100%  Weight:      Height:        Intake/Output Summary (Last 24 hours) at 09/23/2017 0751 Last data filed at 09/23/2017 0700 Gross per 24 hour  Intake 2444.9 ml  Output 2913 ml  Net -468.1 ml   Filed Weights   09/21/17 0500 09/22/17 0500 09/23/17 0100  Weight: 180 lb 1.9 oz (81.7 kg) 173 lb 1 oz (78.5 kg) 171 lb 8.3 oz (77.8 kg)    Physical Exam    Intubated and unresponsive  HENT normal Neck supple with JVP-flat Clear anteriorly Regular rate and rhythm  Abd-soft with active BS No  edema Skin-warm and dry Not moving  Labs    CBC Recent Labs    09/22/17 0350 09/23/17 0359  WBC 11.9* 10.3  HGB 8.5* 7.9*  HCT 26.2* 24.5*  MCV 80.6 81.9  PLT 218 149   Basic Metabolic Panel Recent Labs    09/22/17 0350 09/22/17 1617 09/23/17 0359  NA 133* 139 138  K 4.0 3.8 3.8  CL 100* 104 103  CO2 19* 21* 20*  GLUCOSE 159* 140* 151*  BUN 43* 33* 25*  CREATININE 2.80* 2.28* 1.89*  CALCIUM 7.8* 7.8* 8.1*  MG 2.2  --  2.3  PHOS 4.7* 3.3 2.8  Cardiac Enzymes No results for input(s): CKTOTAL, CKMB, CKMBINDEX, TROPONINI in the last 72 hours.   Telemetry    Intermittent atrial fibrillation.  Pacemaker was off.  Personally  reviewed  Radiology    Chest Xray personally reviewed from last night demonstrating stable lead position compared to prior days.  Dg Chest Port 1 View  Result Date: 09/23/2017 CLINICAL DATA:  Acute respiratory failure EXAM: PORTABLE CHEST 1 VIEW COMPARISON:  09/22/2017 FINDINGS: Endotracheal tube in good position. Bilateral central venous catheter tips in the SVC unchanged. No pneumothorax. Bibasilar airspace disease unchanged. Minimal pleural effusion bilaterally. NG tube enters the stomach. IMPRESSION: Bibasilar airspace disease unchanged. Possible pneumonia in the bases. Electronically Signed   By: Franchot Gallo M.D.   On: 09/23/2017 07:09   Dg Chest Port 1  View  Result Date: 09/22/2017 CLINICAL DATA:  62 year old male undergoing temporary pacemaker placement. EXAM: PORTABLE CHEST 1 VIEW COMPARISON:  0533 hr today and earlier. FINDINGS: Portable AP semi upright view at 1758 hr. An IVC approach cardiac pacemaker lead appears in stable position from earlier today coursing to the expected location of the RV apex (arrow). Endotracheal tube tip is at the clavicle level. Enteric tube courses to the abdomen, tip not included. Stable right IJ dual lumen central line. Stable left IJ smaller central line. Stable cardiac size and mediastinal contours. Patchy and confluent lung base opacity is greater on the left and not significantly changed. No pneumothorax. Stable pulmonary vascularity. Possible small left pleural effusion. IMPRESSION: 1. Stable IVC approach cardiac pacemaker lead from earlier today, coursing to the expected location of the RV apex. 2.  Otherwise stable lines and tubes. 3. Stable ventilation with left greater than right lung base atelectasis or consolidation, possible small left pleural effusion, and pulmonary interstitial edema. Electronically Signed   By: Genevie Ann M.D.   On: 09/22/2017 18:28   Dg Chest Port 1 View  Result Date: 09/22/2017 CLINICAL DATA:  Respiratory failure. EXAM: PORTABLE CHEST 1 VIEW COMPARISON:  09/21/2017. FINDINGS: Endotracheal tube, NG tube, left IJ line, right IJ line, femoral pacer in stable position. Prior CABG. Stable cardiomegaly. Persistent but improved bilateral interstitial prominence noted. Findings suggest persistent but improving CHF. No prominent pleural effusion or pneumothorax. Surgical clips are noted in the neck. IMPRESSION: 1.  Lines and tubes in stable position.  No pneumothorax. 2. Prior CABG. Stable cardiomegaly. Persistent but improved bilateral interstitial prominence noted. Findings suggest persistent but improving CHF. Electronically Signed   By: Marcello Moores  Register   On: 09/22/2017 07:36   Dg Chest Port 1  View  Result Date: 09/21/2017 CLINICAL DATA:  Central line placement. EXAM: PORTABLE CHEST 1 VIEW COMPARISON:  09/21/2017 FINDINGS: Endotracheal tube terminates approximately 3 cm above the carina. A new right jugular catheter terminates over the mid SVC. Left jugular catheter terminates over the upper SVC, unchanged. Enteric tube courses into the left upper abdomen with tip not imaged. Sequelae of prior CABG are again identified. The cardiomediastinal silhouette is unchanged. There is mild central pulmonary vascular congestion. Increased interstitial markings in both lungs appear slightly increased. Retrocardiac opacity in the left lower lobe has increased. No sizable pleural effusion or pneumothorax is identified. IMPRESSION: 1. New right jugular catheter terminates over the mid SVC. 2. Slightly increased interstitial opacity bilaterally, likely mild edema. 3. Increased left basilar opacity which may reflect atelectasis. Electronically Signed   By: Logan Bores M.D.   On: 09/21/2017 11:10   Dg Abd Portable 1v  Result Date: 09/22/2017 CLINICAL DATA:  Abdominal distention. EXAM: PORTABLE ABDOMEN - 1 VIEW COMPARISON:  CT 02/18/2010. FINDINGS: NG tube noted with its tip over the lower mid abdomen. This is most likely in the a  distended stomach. There is a paucity of distal bowel gas. Femoral pacer noted with tip over the right ventricle. Prior CABG. Degenerative changes scoliosis lumbar spine. Basilar atelectasis. IMPRESSION: 1. NG tube noted over the lower abdomen. This is most likely in a distended stomach. There is a paucity of distal bowel gas. The possibility of gastric obstruction should be considered. 2. Femoral pacing wire noted with lead tip over the right ventricle. Prior CABG. 3.  Basilar atelectasis. Electronically Signed   By: Marcello Moores  Register   On: 09/22/2017 12:07     Assessment & Plan    1.  Atrial fibrillation with tachy/brady syndrome   2.  Influenza A   3.  Respiratory failure Per  primary team  4.  Anemia  5.  Renal failure     The patient's device was interrogated.  Sensitivity this morning is 6 mV and threshold is less than 1.5 V.  Device was re-activated backup rate of 40 at 5 V and a sensitivity of 1.5 mJ.  As noted above chest x-ray demonstrated stable position.  If at the beginning of the week it appears that he will continue to be intubated for some time, would recommend that we consider a temporary permanent pacemaker given the complications associate with a transvenous temporary pacemaker line.

## 2017-09-24 ENCOUNTER — Inpatient Hospital Stay (HOSPITAL_COMMUNITY): Payer: Medicare (Managed Care)

## 2017-09-24 LAB — CBC
HCT: 24.8 % — ABNORMAL LOW (ref 39.0–52.0)
HEMOGLOBIN: 7.7 g/dL — AB (ref 13.0–17.0)
MCH: 25.8 pg — AB (ref 26.0–34.0)
MCHC: 31 g/dL (ref 30.0–36.0)
MCV: 82.9 fL (ref 78.0–100.0)
Platelets: 226 10*3/uL (ref 150–400)
RBC: 2.99 MIL/uL — ABNORMAL LOW (ref 4.22–5.81)
RDW: 15.7 % — ABNORMAL HIGH (ref 11.5–15.5)
WBC: 8.4 10*3/uL (ref 4.0–10.5)

## 2017-09-24 LAB — GLUCOSE, CAPILLARY
GLUCOSE-CAPILLARY: 128 mg/dL — AB (ref 65–99)
GLUCOSE-CAPILLARY: 144 mg/dL — AB (ref 65–99)
GLUCOSE-CAPILLARY: 130 mg/dL — AB (ref 65–99)
GLUCOSE-CAPILLARY: 135 mg/dL — AB (ref 65–99)
GLUCOSE-CAPILLARY: 146 mg/dL — AB (ref 65–99)
Glucose-Capillary: 119 mg/dL — ABNORMAL HIGH (ref 65–99)

## 2017-09-24 LAB — RENAL FUNCTION PANEL
Albumin: 1.8 g/dL — ABNORMAL LOW (ref 3.5–5.0)
Albumin: 1.8 g/dL — ABNORMAL LOW (ref 3.5–5.0)
Anion gap: 13 (ref 5–15)
Anion gap: 11 (ref 5–15)
BUN: 21 mg/dL — ABNORMAL HIGH (ref 6–20)
BUN: 20 mg/dL (ref 6–20)
CALCIUM: 8.2 mg/dL — AB (ref 8.9–10.3)
CHLORIDE: 106 mmol/L (ref 101–111)
CO2: 21 mmol/L — ABNORMAL LOW (ref 22–32)
CO2: 22 mmol/L (ref 22–32)
CREATININE: 1.49 mg/dL — AB (ref 0.61–1.24)
Calcium: 8.2 mg/dL — ABNORMAL LOW (ref 8.9–10.3)
Chloride: 106 mmol/L (ref 101–111)
Creatinine, Ser: 1.58 mg/dL — ABNORMAL HIGH (ref 0.61–1.24)
GFR calc Af Amer: 52 mL/min — ABNORMAL LOW (ref >60)
GFR calc Af Amer: 56 mL/min — ABNORMAL LOW (ref >60)
GFR calc non Af Amer: 45 mL/min — ABNORMAL LOW (ref >60)
GFR calc non Af Amer: 48 mL/min — ABNORMAL LOW (ref >60)
GLUCOSE: 155 mg/dL — AB (ref 65–99)
Glucose, Bld: 137 mg/dL — ABNORMAL HIGH (ref 65–99)
Phosphorus: 1.9 mg/dL — ABNORMAL LOW (ref 2.5–4.6)
Phosphorus: 2 mg/dL — ABNORMAL LOW (ref 2.5–4.6)
Potassium: 3.7 mmol/L (ref 3.5–5.1)
Potassium: 3.9 mmol/L (ref 3.5–5.1)
Sodium: 140 mmol/L (ref 135–145)
Sodium: 139 mmol/L (ref 135–145)

## 2017-09-24 LAB — COMPREHENSIVE METABOLIC PANEL
ALT: 12 U/L — ABNORMAL LOW (ref 17–63)
AST: 22 U/L (ref 15–41)
Albumin: 1.8 g/dL — ABNORMAL LOW (ref 3.5–5.0)
Alkaline Phosphatase: 47 U/L (ref 38–126)
Anion gap: 13 (ref 5–15)
BUN: 21 mg/dL — ABNORMAL HIGH (ref 6–20)
CO2: 21 mmol/L — ABNORMAL LOW (ref 22–32)
Calcium: 8.2 mg/dL — ABNORMAL LOW (ref 8.9–10.3)
Chloride: 105 mmol/L (ref 101–111)
Creatinine, Ser: 1.68 mg/dL — ABNORMAL HIGH (ref 0.61–1.24)
GFR calc Af Amer: 48 mL/min — ABNORMAL LOW (ref >60)
GFR calc non Af Amer: 42 mL/min — ABNORMAL LOW (ref >60)
Glucose, Bld: 137 mg/dL — ABNORMAL HIGH (ref 65–99)
Potassium: 3.7 mmol/L (ref 3.5–5.1)
Sodium: 139 mmol/L (ref 135–145)
Total Bilirubin: 0.8 mg/dL (ref 0.3–1.2)
Total Protein: 5.9 g/dL — ABNORMAL LOW (ref 6.5–8.1)

## 2017-09-24 LAB — MAGNESIUM: Magnesium: 2.4 mg/dL (ref 1.7–2.4)

## 2017-09-24 LAB — HEPARIN LEVEL (UNFRACTIONATED): Heparin Unfractionated: 0.47 IU/mL (ref 0.30–0.70)

## 2017-09-24 MED ORDER — VITAL HIGH PROTEIN PO LIQD
1000.0000 mL | ORAL | Status: DC
  Administered 2017-09-24: 1000 mL

## 2017-09-24 NOTE — Progress Notes (Signed)
S: able to wean off of pressors last night O:BP (!) 113/54   Pulse 63   Temp 97.9 F (36.6 C)   Resp (!) 28   Ht 5' (1.524 m)   Wt 77.3 kg (170 lb 6.7 oz)   SpO2 100%   BMI 33.28 kg/m   Intake/Output Summary (Last 24 hours) at 09/24/2017 1304 Last data filed at 09/24/2017 1300 Gross per 24 hour  Intake 2389.94 ml  Output 3072 ml  Net -682.06 ml   Intake/Output: I/O last 3 completed shifts: In: 3761.6 [I.V.:2481.6; NG/GT:80; IV Piggyback:1200] Out: 1610 [Urine:47; Emesis/NG output:750; RUEAV:4098]  Intake/Output this shift:  Total I/O In: 817 [I.V.:387; Other:30; IV Piggyback:400] Out: 788 [Urine:7; Other:781] Weight change: -0.5 kg (-1.6 oz) Gen: intubated and awake CVS: no rub Resp: cta Abd: benign Ext: trace edema  Recent Labs  Lab 09/21/17 0426 09/21/17 1555 09/22/17 0350 09/22/17 1617 09/23/17 0359 09/23/17 1545 09/24/17 0402  NA 127* 130* 133* 139 138 136 139  140  K 3.6 3.7 4.0 3.8 3.8 3.7 3.7  3.7  CL 93* 97* 100* 104 103 103 105  106  CO2 17* 18* 19* 21* 20* 20* 21*  21*  GLUCOSE 147* 154* 159* 140* 151* 151* 137*  137*  BUN 80* 65* 43* 33* 25* 21* 21*  21*  CREATININE 4.94* 4.16* 2.80* 2.28* 1.89* 1.71* 1.68*  1.58*  ALBUMIN  --  1.5* 1.6* 1.5* 1.6* 1.7* 1.8*  1.8*  CALCIUM 7.6* 7.4* 7.8* 7.8* 8.1* 7.9* 8.2*  8.2*  PHOS 6.4* 6.0* 4.7* 3.3 2.8 2.2* 1.9*  AST  --   --  48*  --   --   --  22  ALT  --   --  15*  --   --   --  12*   Liver Function Tests: Recent Labs  Lab 09/22/17 0350  09/23/17 0359 09/23/17 1545 09/24/17 0402  AST 48*  --   --   --  22  ALT 15*  --   --   --  12*  ALKPHOS 60  --   --   --  47  BILITOT 0.5  --   --   --  0.8  PROT 6.2*  --   --   --  5.9*  ALBUMIN 1.6*   < > 1.6* 1.7* 1.8*  1.8*   < > = values in this interval not displayed.   No results for input(s): LIPASE, AMYLASE in the last 168 hours. No results for input(s): AMMONIA in the last 168 hours. CBC: Recent Labs  Lab 09/20/17 0345 09/21/17 0426  09/22/17 0350 09/23/17 0359 09/24/17 0402  WBC 23.2* 20.3* 11.9* 10.3 8.4  HGB 9.3* 8.8* 8.5* 7.9* 7.7*  HCT 28.9* 27.0* 26.2* 24.5* 24.8*  MCV 82.6 81.6 80.6 81.9 82.9  PLT 325 264 218 230 226   Cardiac Enzymes: No results for input(s): CKTOTAL, CKMB, CKMBINDEX, TROPONINI in the last 168 hours. CBG: Recent Labs  Lab 09/23/17 2100 09/24/17 0012 09/24/17 0422 09/24/17 0749 09/24/17 1148  GLUCAP 152* 119* 128* 144* 130*    Iron Studies: No results for input(s): IRON, TIBC, TRANSFERRIN, FERRITIN in the last 72 hours. Studies/Results: Dg Chest Port 1 View  Result Date: 09/24/2017 CLINICAL DATA:  Respiratory failure and abdominal distension. EXAM: PORTABLE CHEST 1 VIEW COMPARISON:  09/23/2017 FINDINGS: Endotracheal tube has tip 3.6 cm above the carina. Bilateral IJ central venous catheters are unchanged. Nasogastric tube tip not seen and otherwise unchanged. Lungs are adequately inflated  with persistent mild infrahilar/bibasilar opacification with slight interval improvement left base likely atelectasis and small left effusion, although infection is possible. Possible component of improving edema. Mild stable cardiomegaly. Remainder of the exam is unchanged. IMPRESSION: Mild bibasilar opacification with slight improvement in the left base. Findings likely due to atelectasis and small left effusion, although infection is possible. Possible component of improving edema. Tubes and lines as described. Electronically Signed   By: Marin Olp M.D.   On: 09/24/2017 07:09   Dg Chest Port 1 View  Result Date: 09/23/2017 CLINICAL DATA:  Acute respiratory failure EXAM: PORTABLE CHEST 1 VIEW COMPARISON:  09/22/2017 FINDINGS: Endotracheal tube in good position. Bilateral central venous catheter tips in the SVC unchanged. No pneumothorax. Bibasilar airspace disease unchanged. Minimal pleural effusion bilaterally. NG tube enters the stomach. IMPRESSION: Bibasilar airspace disease unchanged. Possible  pneumonia in the bases. Electronically Signed   By: Franchot Gallo M.D.   On: 09/23/2017 07:09   Dg Chest Port 1 View  Result Date: 09/22/2017 CLINICAL DATA:  62 year old male undergoing temporary pacemaker placement. EXAM: PORTABLE CHEST 1 VIEW COMPARISON:  0533 hr today and earlier. FINDINGS: Portable AP semi upright view at 1758 hr. An IVC approach cardiac pacemaker lead appears in stable position from earlier today coursing to the expected location of the RV apex (arrow). Endotracheal tube tip is at the clavicle level. Enteric tube courses to the abdomen, tip not included. Stable right IJ dual lumen central line. Stable left IJ smaller central line. Stable cardiac size and mediastinal contours. Patchy and confluent lung base opacity is greater on the left and not significantly changed. No pneumothorax. Stable pulmonary vascularity. Possible small left pleural effusion. IMPRESSION: 1. Stable IVC approach cardiac pacemaker lead from earlier today, coursing to the expected location of the RV apex. 2.  Otherwise stable lines and tubes. 3. Stable ventilation with left greater than right lung base atelectasis or consolidation, possible small left pleural effusion, and pulmonary interstitial edema. Electronically Signed   By: Genevie Ann M.D.   On: 09/22/2017 18:28   Dg Abd Portable 1v  Result Date: 09/24/2017 CLINICAL DATA:  Abdominal distention. EXAM: PORTABLE ABDOMEN - 1 VIEW COMPARISON:  09/22/2017 FINDINGS: Left femoral cardiac pacer lead unchanged. Nasogastric tube is present with tip just right of midline in the mid abdomen likely within the distal stomach. Bowel gas pattern is nonobstructive with relative paucity of bowel gas present. No mass or mass effect. No free peritoneal air. Moderate curvature of the thoracolumbar spine convex right with moderate degenerative changes. IMPRESSION: Nonobstructive bowel gas pattern with relative paucity of bowel gas present. Nasogastric tube with tip over the right mid  abdomen likely in the distal stomach. Electronically Signed   By: Marin Olp M.D.   On: 09/24/2017 07:16   . atorvastatin  80 mg Per Tube Daily  . chlorhexidine gluconate (MEDLINE KIT)  15 mL Mouth Rinse BID  . dextrose  0.5 ampule Intravenous Once  . fentaNYL (SUBLIMAZE) injection  50 mcg Intravenous Once  . hydrocerin   Topical BID  . hydrocortisone sod succinate (SOLU-CORTEF) inj  50 mg Intravenous Q6H  . insulin aspart  0-9 Units Subcutaneous TID WC  . insulin glargine  10 Units Subcutaneous QHS  . ipratropium-albuterol  3 mL Nebulization BID  . mouth rinse  15 mL Mouth Rinse 10 times per day  . pantoprazole sodium  40 mg Per Tube Daily  . sodium chloride flush  3 mL Intravenous Q12H    BMET  Component Value Date/Time   NA 140 09/24/2017 0402   NA 139 09/24/2017 0402   NA 141 10/28/2016 1558   K 3.7 09/24/2017 0402   K 3.7 09/24/2017 0402   CL 106 09/24/2017 0402   CL 105 09/24/2017 0402   CO2 21 (L) 09/24/2017 0402   CO2 21 (L) 09/24/2017 0402   GLUCOSE 137 (H) 09/24/2017 0402   GLUCOSE 137 (H) 09/24/2017 0402   BUN 21 (H) 09/24/2017 0402   BUN 21 (H) 09/24/2017 0402   BUN 52 (H) 10/28/2016 1558   CREATININE 1.58 (H) 09/24/2017 0402   CREATININE 1.68 (H) 09/24/2017 0402   CREATININE 1.46 (H) 08/04/2015 0828   CALCIUM 8.2 (L) 09/24/2017 0402   CALCIUM 8.2 (L) 09/24/2017 0402   GFRNONAA 45 (L) 09/24/2017 0402   GFRNONAA 42 (L) 09/24/2017 0402   GFRAA 52 (L) 09/24/2017 0402   GFRAA 48 (L) 09/24/2017 0402   CBC    Component Value Date/Time   WBC 8.4 09/24/2017 0402   RBC 2.99 (L) 09/24/2017 0402   HGB 7.7 (L) 09/24/2017 0402   HCT 24.8 (L) 09/24/2017 0402   PLT 226 09/24/2017 0402   MCV 82.9 09/24/2017 0402   MCH 25.8 (L) 09/24/2017 0402   MCHC 31.0 09/24/2017 0402   RDW 15.7 (H) 09/24/2017 0402   LYMPHSABS 1.6 10/05/2016 1155   MONOABS 0.6 10/05/2016 1155   EOSABS 0.3 10/05/2016 1155   BASOSABS 0.1 10/05/2016 1155    Assessment/Plan:  1. AKI/CKD  stage 3 in setting of severe sepsis syndrome requiring multiple pressors.  He has been oliguric and CVVHD was initiated on 09/21/17 via temp HD catheter.   1. He has been tolerating CVVHD and we are keeping even for now given ongoing hypotension 2. Able to wean off of pressor support last night.  May be able to transition to IHD soon. 3. Continue to monitor UOP.   2. VDRF- per PCCM 3. Cardiogenic shock- in setting of sepsis and afib with tachy/brady syndrome 4. A fib with tachy/brady syndrome s/p temp pacemaker.  May need PPM when stable.  On amio per Cardiology 5. Anemia of critical illness 6. Acute metabolic encephalopathy- in setting of sepsis 7. Influenza A c/b sepsis   Donetta Potts, MD [facility] [phone]

## 2017-09-24 NOTE — Progress Notes (Signed)
Arterial blood gas drawn on 09/24/17 at 0330 on the following settings: PRVC,Rate-28,30% Vt=500,Peep 5cm.

## 2017-09-24 NOTE — Progress Notes (Signed)
Electrophysiology Rounding Note  Patient Name: [name] Date of Encounter: 09/24/2017  Primary Cardiologist: Nahser Electrophysiologist: Lovena Le   Subjective   Patient response to voice and denies chest pain or other pain   Inpatient Medications    Scheduled Meds: . atorvastatin  80 mg Per Tube Daily  . chlorhexidine gluconate (MEDLINE KIT)  15 mL Mouth Rinse BID  . dextrose  0.5 ampule Intravenous Once  . fentaNYL (SUBLIMAZE) injection  50 mcg Intravenous Once  . hydrocerin   Topical BID  . hydrocortisone sod succinate (SOLU-CORTEF) inj  50 mg Intravenous Q6H  . insulin aspart  0-9 Units Subcutaneous TID WC  . insulin glargine  10 Units Subcutaneous QHS  . ipratropium-albuterol  3 mL Nebulization BID  . mouth rinse  15 mL Mouth Rinse 10 times per day  . pantoprazole sodium  40 mg Per Tube Daily  . sodium chloride flush  3 mL Intravenous Q12H   Continuous Infusions: . sodium chloride 250 mL (09/24/17 1500)  . amiodarone Stopped (09/23/17 1159)  . amiodarone 30 mg/hr (09/24/17 1500)  . ceFEPime (MAXIPIME) IV Stopped (09/24/17 1140)  . dexmedetomidine (PRECEDEX) IV infusion 0.7 mcg/kg/hr (09/24/17 1516)  . feeding supplement (VITAL HIGH PROTEIN) 1,000 mL (09/24/17 1500)  . fentaNYL infusion INTRAVENOUS 50 mcg/hr (09/24/17 1500)  . heparin 1,300 Units/hr (09/24/17 1500)  . heparin    . linezolid (ZYVOX) IV Stopped (09/24/17 1104)  . phenylephrine (NEO-SYNEPHRINE) Adult infusion Stopped (09/24/17 0100)  . dialysis replacement fluid (prismasate) 300 mL/hr at 09/24/17 0839  . dialysis replacement fluid (prismasate) 500 mL/hr at 09/24/17 1223  . dialysate (PRISMASATE) 1,000 mL/hr at 09/24/17 1215   PRN Meds: sodium chloride, acetaminophen, camphor-menthol, fentaNYL, heparin, heparin, midazolam, nitroGLYCERIN, ondansetron (ZOFRAN) IV, polyvinyl alcohol, senna-docusate, sodium chloride flush   Vital Signs    Vitals:   09/24/17 1200 09/24/17 1300 09/24/17 1400  09/24/17 1500  BP: (!) 113/54  (!) 126/58   Pulse: 63 61 62 60  Resp: (!) 28 (!) 28 (!) 28 (!) 28  Temp: 97.9 F (36.6 C) 97.9 F (36.6 C) 97.7 F (36.5 C) 97.7 F (36.5 C)  TempSrc:      SpO2: 100% 100% 100% 100%  Weight:      Height:        Intake/Output Summary (Last 24 hours) at 09/24/2017 1532 Last data filed at 09/24/2017 1500 Gross per 24 hour  Intake 2358.87 ml  Output 2960 ml  Net -601.13 ml   Filed Weights   09/22/17 0500 09/23/17 0100 09/24/17 0256  Weight: 173 lb 1 oz (78.5 kg) 171 lb 8.3 oz (77.8 kg) 170 lb 6.7 oz (77.3 kg)    Physical Exam    Intubated but responsive HENT normal Neck supple  Carotids brisk and full without bruits Clear Regular rate and rhythm, no murmurs or gallops Abd-soft with active BS without hepatomegaly + edema Skin-warm and dry A & Oriented  Grossly normal sensory and motor function   Labs    CBC Recent Labs    09/23/17 0359 09/24/17 0402  WBC 10.3 8.4  HGB 7.9* 7.7*  HCT 24.5* 24.8*  MCV 81.9 82.9  PLT 230 119   Basic Metabolic Panel Recent Labs    09/23/17 0359 09/23/17 1545 09/24/17 0402  NA 138 136 139  140  K 3.8 3.7 3.7  3.7  CL 103 103 105  106  CO2 20* 20* 21*  21*  GLUCOSE 151* 151* 137*  137*  BUN 25* 21* 21*  21*  CREATININE 1.89* 1.71* 1.68*  1.58*  CALCIUM 8.1* 7.9* 8.2*  8.2*  MG 2.3  --  2.4  PHOS 2.8 2.2* 1.9*  Cardiac Enzymes No results for input(s): CKTOTAL, CKMB, CKMBINDEX, TROPONINI in the last 72 hours.   Telemetry    Intermittent atrial fibrillation.  Sinus rhythm.  AIVR.  Personally reviewed  Radiology    Chest Xray personally reviewed from last night demonstrating stable lead position compared to prior days.  Dg Chest Port 1 View  Result Date: 09/24/2017 CLINICAL DATA:  Respiratory failure and abdominal distension. EXAM: PORTABLE CHEST 1 VIEW COMPARISON:  09/23/2017 FINDINGS: Endotracheal tube has tip 3.6 cm above the carina. Bilateral IJ central venous catheters are  unchanged. Nasogastric tube tip not seen and otherwise unchanged. Lungs are adequately inflated with persistent mild infrahilar/bibasilar opacification with slight interval improvement left base likely atelectasis and small left effusion, although infection is possible. Possible component of improving edema. Mild stable cardiomegaly. Remainder of the exam is unchanged. IMPRESSION: Mild bibasilar opacification with slight improvement in the left base. Findings likely due to atelectasis and small left effusion, although infection is possible. Possible component of improving edema. Tubes and lines as described. Electronically Signed   By: Marin Olp M.D.   On: 09/24/2017 07:09   Dg Chest Port 1 View  Result Date: 09/23/2017 CLINICAL DATA:  Acute respiratory failure EXAM: PORTABLE CHEST 1 VIEW COMPARISON:  09/22/2017 FINDINGS: Endotracheal tube in good position. Bilateral central venous catheter tips in the SVC unchanged. No pneumothorax. Bibasilar airspace disease unchanged. Minimal pleural effusion bilaterally. NG tube enters the stomach. IMPRESSION: Bibasilar airspace disease unchanged. Possible pneumonia in the bases. Electronically Signed   By: Franchot Gallo M.D.   On: 09/23/2017 07:09   Dg Chest Port 1 View  Result Date: 09/22/2017 CLINICAL DATA:  62 year old male undergoing temporary pacemaker placement. EXAM: PORTABLE CHEST 1 VIEW COMPARISON:  0533 hr today and earlier. FINDINGS: Portable AP semi upright view at 1758 hr. An IVC approach cardiac pacemaker lead appears in stable position from earlier today coursing to the expected location of the RV apex (arrow). Endotracheal tube tip is at the clavicle level. Enteric tube courses to the abdomen, tip not included. Stable right IJ dual lumen central line. Stable left IJ smaller central line. Stable cardiac size and mediastinal contours. Patchy and confluent lung base opacity is greater on the left and not significantly changed. No pneumothorax. Stable  pulmonary vascularity. Possible small left pleural effusion. IMPRESSION: 1. Stable IVC approach cardiac pacemaker lead from earlier today, coursing to the expected location of the RV apex. 2.  Otherwise stable lines and tubes. 3. Stable ventilation with left greater than right lung base atelectasis or consolidation, possible small left pleural effusion, and pulmonary interstitial edema. Electronically Signed   By: Genevie Ann M.D.   On: 09/22/2017 18:28   Dg Abd Portable 1v  Result Date: 09/24/2017 CLINICAL DATA:  Abdominal distention. EXAM: PORTABLE ABDOMEN - 1 VIEW COMPARISON:  09/22/2017 FINDINGS: Left femoral cardiac pacer lead unchanged. Nasogastric tube is present with tip just right of midline in the mid abdomen likely within the distal stomach. Bowel gas pattern is nonobstructive with relative paucity of bowel gas present. No mass or mass effect. No free peritoneal air. Moderate curvature of the thoracolumbar spine convex right with moderate degenerative changes. IMPRESSION: Nonobstructive bowel gas pattern with relative paucity of bowel gas present. Nasogastric tube with tip over the right mid abdomen likely in the distal stomach. Electronically Signed  By: Marin Olp M.D.   On: 09/24/2017 07:16     Assessment & Plan    1.  Atrial fibrillation with tachy/brady syndrome   2.  Influenza A   3.  Respiratory failure Per primary team  4.  Anemia  5.  Renal failure  Patient's rhythm has been stable.  AIVR yesterday. We will talk with Dr. Elliot Cousin in the morning but I wonder whether it is not time to put a temporary permanent pacemaker so as to allow mobilization.  Now off meal.  Still on CVVH.  Anuric.

## 2017-09-24 NOTE — Progress Notes (Signed)
PULMONARY / CRITICAL CARE MEDICINE   Name: [name] MRN: [id number] DOB: 1956/02/06    ADMISSION DATE:  09/15/2017 CONSULTATION DATE: 09/18/2017  REFERRING MD: Neva Seat  Reason for consult: hypotension and worsening aA gradient  HISTORY OF PRESENT ILLNESS:        This is a 62 year old diabetic with a history of coronary disease and congestive heart failure with a baseline ejection fraction of 40-50%.  He was admitted on 2/8 complaining of generalized weakness and was found to be in atrial fibrillation with a rapid ventricular response with intermittent 7-8-second pauses.  A temporary pacing wire was placed.  He was noted to have infiltrates or CHF on his chest x-ray.  PCR for influenza a was positive.  He was started on a combination of Tamiflu as well as clindamycin and aztreonam due to a penicillin allergy.  He was diuresed but diuretics are currently being held as his creatinine has risen from approximately 0.9-2.5.  I was called today for hypotension and increasing oxygen requirements.  On my arrival the patient's mental status is very poor.  He is extremely lethargic and not giving any significant history.  He has had a nonproductive cough.  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  SUBJECTIVE:   Remains  on CVVH>> Keeping even Remains intubated, off Neo at present , in NSR VITAL SIGNS: BP (!) 130/47   Pulse 62   Temp (!) 97.5 F (36.4 C)   Resp (!) 28   Ht 5' (1.524 m)   Wt 170 lb 6.7 oz (77.3 kg)   SpO2 100%   BMI 33.28 kg/m   HEMODYNAMICS: CVP:  [9 mmHg] 9 mmHg  VENTILATOR SETTINGS: Vent Mode: PRVC FiO2 (%):  [30 %] 30 % Set Rate:  [28 bmp] 28 bmp Vt Set:  [400 mL] 400 mL PEEP:  [5 cmH20] 5 cmH20 Plateau Pressure:  [17 cmH20-18 cmH20] 17 cmH20  INTAKE / OUTPUT: I/O last 3 completed shifts: In: 3761.6 [I.V.:2481.6; NG/GT:80; IV Piggyback:1200] Out: 5573 [Urine:47; Emesis/NG  output:750; UKGUR:4270]  PHYSICAL EXAMINATION:   Gen:     Intubated sedated male on CVVH, follows simple commands HEENT:  EOMI, sclera anicteric, OG ETT Neck:     No masses; no thyromegaly, No JVD Lungs:    Clear to auscultation bilaterally; Intubated and on full vent support CV:         NSR, S1, S2, No RMG Abd:      + bowel sounds; soft, non-tender; no palpable masses, no distension Ext:    Right metatarsal amputation, otherwise warm dry and intact Skin:      Warm and dry; no rash, pale Neuro:  Sedated, awakens easily and follows commands, MAE x 4  LABS:  BMET Recent Labs  Lab 09/23/17 0359 09/23/17 1545 09/24/17 0402  NA 138 136 139  140  K 3.8 3.7 3.7  3.7  CL 103 103 105  106  CO2 20* 20* 21*  21*  BUN 25* 21* 21*  21*  CREATININE 1.89* 1.71* 1.68*  1.58*  GLUCOSE 151* 151* 137*  137*    Electrolytes Recent Labs  Lab 09/22/17 0350  09/23/17 0359 09/23/17 1545 09/24/17 0402  CALCIUM 7.8*   < > 8.1* 7.9* 8.2*  8.2*  MG 2.2  --  2.3  --  2.4  PHOS 4.7*   < > 2.8 2.2* 1.9*   < > = values in this interval  not displayed.    CBC Recent Labs  Lab 09/22/17 0350 09/23/17 0359 09/24/17 0402  WBC 11.9* 10.3 8.4  HGB 8.5* 7.9* 7.7*  HCT 26.2* 24.5* 24.8*  PLT 218 230 226    Coag's No results for input(s): APTT, INR in the last 168 hours.  Sepsis Markers Recent Labs  Lab 09/18/17 1520 09/18/17 2051 09/19/17 0328 09/20/17 0345  LATICACIDVEN 1.2 1.3  --   --   PROCALCITON 1.54  --  1.85 12.09    ABG Recent Labs  Lab 09/21/17 0449 09/22/17 0355 09/24/17 0322  PHART 7.329* 7.368 7.412  PCO2ART 38.1 38.7 38.0  PO2ART 100 90.8 107    Liver Enzymes Recent Labs  Lab 09/22/17 0350  09/23/17 0359 09/23/17 1545 09/24/17 0402  AST 48*  --   --   --  22  ALT 15*  --   --   --  12*  ALKPHOS 60  --   --   --  47  BILITOT 0.5  --   --   --  0.8  ALBUMIN 1.6*   < > 1.6* 1.7* 1.8*  1.8*   < > = values in this interval not displayed.     Cardiac Enzymes No results for input(s): TROPONINI, PROBNP in the last 168 hours.  Glucose Recent Labs  Lab 09/23/17 1553 09/23/17 2100 09/24/17 0012 09/24/17 0422 09/24/17 0749 09/24/17 1148  GLUCAP 143* 152* 119* 128* 144* 130*    Imaging Dg Chest Port 1 View  Result Date: 09/24/2017 CLINICAL DATA:  Respiratory failure and abdominal distension. EXAM: PORTABLE CHEST 1 VIEW COMPARISON:  09/23/2017 FINDINGS: Endotracheal tube has tip 3.6 cm above the carina. Bilateral IJ central venous catheters are unchanged. Nasogastric tube tip not seen and otherwise unchanged. Lungs are adequately inflated with persistent mild infrahilar/bibasilar opacification with slight interval improvement left base likely atelectasis and small left effusion, although infection is possible. Possible component of improving edema. Mild stable cardiomegaly. Remainder of the exam is unchanged. IMPRESSION: Mild bibasilar opacification with slight improvement in the left base. Findings likely due to atelectasis and small left effusion, although infection is possible. Possible component of improving edema. Tubes and lines as described. Electronically Signed   By: Marin Olp M.D.   On: 09/24/2017 07:09   Dg Abd Portable 1v  Result Date: 09/24/2017 CLINICAL DATA:  Abdominal distention. EXAM: PORTABLE ABDOMEN - 1 VIEW COMPARISON:  09/22/2017 FINDINGS: Left femoral cardiac pacer lead unchanged. Nasogastric tube is present with tip just right of midline in the mid abdomen likely within the distal stomach. Bowel gas pattern is nonobstructive with relative paucity of bowel gas present. No mass or mass effect. No free peritoneal air. Moderate curvature of the thoracolumbar spine convex right with moderate degenerative changes. IMPRESSION: Nonobstructive bowel gas pattern with relative paucity of bowel gas present. Nasogastric tube with tip over the right mid abdomen likely in the distal stomach. Electronically Signed   By:  Marin Olp M.D.   On: 09/24/2017 07:16    ANTIBIOTICS: Tamiflu 2/8 > 2/12  Cefepime 2/12 >  Linezolid 2/12 >  Clinda 2/11 > 2/12 2/11 azactam>> 2/12   LINES/TUBES: There is a left femoral introducer through which a pacing wire has been passed. ETT 2/12 >  L brachial art line 2/12 >  L IJ CVL 2/12 >   DISCUSSION:  This is a 62 year old diabetic with a history of LV dysfunction secondary to coronary artery disease as well as paroxysmal atrial fibrillation who presented with  weakness, A. fib with rapid ventricular response, and intermittent pauses.  He has been found to be influenza A positive.  He has been placed on amiodarone and a temporary pacing wire has been placed. Respiratory status deteriorated 2/12; therefore, he required intubation.  ASSESSMENT / PLAN: Acute hypoxemic respiratory failure with bilateral pulmonary infiltrates Flu a infection, bacterial pneumonia. CXR 2/17>>Mild bibasilar opacification with slight improvement in the left base. Findings likely due to atelectasis and small left effusion, although infection is possible. Possible component of improving edema. Plan: - Full vent support,SBT 2/18 if remains stable and off pressors - Follow chest x-ray - Titrate PEEP and  FiO2 for sats > 94% - Continue Zyvox and cefepime - ABG prn  Shock: etiology unclear. Likely combo cardiogenic, septic (PCT 12), hypovolemia (CVP 6 - 9). Lactic acid WNL. Concern he could actually be volume down. He is also on chronic florinef, with a cortisol of 18. May not be supplementing this effectively. Plan:  - Neo as needed  to keep MAP > 65 mmHg - D/c florinef, switch to stress dose steroids given minimal improvement - d/c metoprolol  Septic, cardiogenic shock Atrial fibrillation with Tachy/Brady syndrome: Temp pacer in place. Plans for PPM but currently too ill.  Episode of VT 2/16 am, pt asymptomatic, self resolved>> a fib with RVR>> resumed Neo, bolused with amio and increased  gtt All lytes WNL Plan: - Continue temp pacer for now with plans for perm once stable, possibly 2/18 - Continue amiodarone per cards - Continue heparin infusion per pharmacy - Neo off for now - EKG prn  AKI - oliguric AGMA - ?uremia Hypomagnesemia>> resolved - On CVVH, keep even per renal - replete electrolytes as needed - Monitor BMET  Anemia - chronic - Transfuse for Hgb < 7 - Follow CBC - monitor for acute bleeding  DM - CBG monitoring and SSI  Acute metabolic encephalopathy - with sedation needs due to mechanical ventilation - Sedation:  Fentanyl gtt / precedex gtt / versed PRN -RASS goal: 0 to -1  Abdominal distention, large ventral hernia Plan: -Checking abdominal x-ray.>>  most likely in a distended stomach. There is a paucity of distal bowel gas. The possibility of gastric obstruction should be considered.>> 2/17>> Non obstructive bowel pattern - Continue OG suction - Decreased bilious output last 24 - Per night nurse, passing gas - Will need to address nutrition 2/18 if continued to tolerate OG clamping  2. Femoral pacing wire noted with lead tip over the right ventricle. Prior CABG.  3.  Basilar atelectasis.   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 Will need ENT consult once improved.   Family updated at bedside 2/17.  Prognosis remains  guarded.  Magdalen Spatz, AGACNP-BC [facility] Pager : [phone] 09/24/2017, 12:01 PM

## 2017-09-24 NOTE — Progress Notes (Signed)
ANTICOAGULATION CONSULT NOTE - Follow Up Consult  Pharmacy Consult for Heparin Indication: atrial fibrillation  Allergies  Allergen Reactions  . Penicillins Hives, Nausea And Vomiting, Swelling and Other (See Comments)    Has patient had a PCN reaction causing immediate rash, facial/tongue/throat swelling, SOB or lightheadedness with hypotension: YES Has patient had a PCN reaction causing severe rash involving mucus membranes or skin necrosis: No Has patient had a PCN reaction that required hospitalization No Has patient had a PCN reaction occurring within the last 10 years: No If all of the above answers are "NO", then may proceed with Cephalosporin use.     Patient Measurements: Height: 5' (152.4 cm) Weight: 170 lb 6.7 oz (77.3 kg) IBW/kg (Calculated) : 50  Vital Signs: Temp: 97.5 F (36.4 C) (02/17 0700) Temp Source: Esophageal (02/17 0400) BP: 119/58 (02/17 0700) Pulse Rate: 58 (02/17 0700)  Labs: Recent Labs    09/22/17 0350 09/22/17 0355  09/23/17 0359 09/23/17 1545 09/24/17 0402  HGB 8.5*  --   --  7.9*  --  7.7*  HCT 26.2*  --   --  24.5*  --  24.8*  PLT 218  --   --  230  --  226  HEPARINUNFRC  --  0.72*  --  0.71*  --  0.47  CREATININE 2.80*  --    < > 1.89* 1.71* 1.68*  1.58*   < > = values in this interval not displayed.    Estimated Creatinine Clearance: 41.2 mL/min (A) (by C-G formula based on SCr of 1.58 mg/dL (H)).  Medications: Heparin @ 1300 units/hr  Assessment:  61yom continues on heparin for afib. Heparin level is therapeutic at 0.47. Hgb down to 7.7, platelets stable 226.  Xarelto 15mg  PTA for Afib per med rec, cards note says pt is NOT on anticoag 2/2 GIB in past. Regardless plans are for [facility] once procedures completed (likely apixaban)  Goal of Therapy:  Heparin level 0.3-0.7 units/ml Monitor platelets by anticoagulation protocol: Yes   Plan:  1) Continue heparin at 1300 units/hr 2) Daily heparin level and CBC   Nena Jordan,  PharmD, BCPS 09/24/2017 7:31 AM

## 2017-09-25 ENCOUNTER — Inpatient Hospital Stay (HOSPITAL_COMMUNITY): Payer: Medicare (Managed Care)

## 2017-09-25 LAB — BLOOD GAS, ARTERIAL
ACID-BASE DEFICIT: 0.3 mmol/L (ref 0.0–2.0)
BICARBONATE: 23.7 mmol/L (ref 20.0–28.0)
Drawn by: 252031
FIO2: 30
LHR: 28 {breaths}/min
MECHVT: 400 mL
O2 Saturation: 97.6 %
PEEP/CPAP: 5 cmH2O
PH ART: 7.412 (ref 7.350–7.450)
PO2 ART: 107 mmHg (ref 83.0–108.0)
Patient temperature: 98.6
pCO2 arterial: 38 mmHg (ref 32.0–48.0)

## 2017-09-25 LAB — CBC
HEMATOCRIT: 25.5 % — AB (ref 39.0–52.0)
Hemoglobin: 7.9 g/dL — ABNORMAL LOW (ref 13.0–17.0)
MCH: 26.2 pg (ref 26.0–34.0)
MCHC: 31 g/dL (ref 30.0–36.0)
MCV: 84.7 fL (ref 78.0–100.0)
PLATELETS: 207 10*3/uL (ref 150–400)
RBC: 3.01 MIL/uL — ABNORMAL LOW (ref 4.22–5.81)
RDW: 15.9 % — AB (ref 11.5–15.5)
WBC: 7.3 10*3/uL (ref 4.0–10.5)

## 2017-09-25 LAB — POCT I-STAT 3, ART BLOOD GAS (G3+)
Acid-base deficit: 3 mmol/L — ABNORMAL HIGH (ref 0.0–2.0)
BICARBONATE: 21 mmol/L (ref 20.0–28.0)
O2 Saturation: 98 %
PO2 ART: 105 mmHg (ref 83.0–108.0)
TCO2: 22 mmol/L (ref 22–32)
pCO2 arterial: 33.4 mmHg (ref 32.0–48.0)
pH, Arterial: 7.405 (ref 7.350–7.450)

## 2017-09-25 LAB — BASIC METABOLIC PANEL
ANION GAP: 14 (ref 5–15)
BUN: 21 mg/dL — ABNORMAL HIGH (ref 6–20)
CALCIUM: 8.3 mg/dL — AB (ref 8.9–10.3)
CO2: 21 mmol/L — ABNORMAL LOW (ref 22–32)
Chloride: 105 mmol/L (ref 101–111)
Creatinine, Ser: 1.53 mg/dL — ABNORMAL HIGH (ref 0.61–1.24)
GFR, EST AFRICAN AMERICAN: 54 mL/min — AB (ref >60)
GFR, EST NON AFRICAN AMERICAN: 47 mL/min — AB (ref >60)
Glucose, Bld: 177 mg/dL — ABNORMAL HIGH (ref 65–99)
Potassium: 4 mmol/L (ref 3.5–5.1)
Sodium: 140 mmol/L (ref 135–145)

## 2017-09-25 LAB — RENAL FUNCTION PANEL
ALBUMIN: 1.9 g/dL — AB (ref 3.5–5.0)
ANION GAP: 15 (ref 5–15)
Albumin: 1.9 g/dL — ABNORMAL LOW (ref 3.5–5.0)
Anion gap: 13 (ref 5–15)
BUN: 20 mg/dL (ref 6–20)
BUN: 26 mg/dL — ABNORMAL HIGH (ref 6–20)
CALCIUM: 8.2 mg/dL — AB (ref 8.9–10.3)
CHLORIDE: 104 mmol/L (ref 101–111)
CO2: 22 mmol/L (ref 22–32)
CO2: 19 mmol/L — ABNORMAL LOW (ref 22–32)
CREATININE: 1.51 mg/dL — AB (ref 0.61–1.24)
Calcium: 8.4 mg/dL — ABNORMAL LOW (ref 8.9–10.3)
Chloride: 105 mmol/L (ref 101–111)
Creatinine, Ser: 1.83 mg/dL — ABNORMAL HIGH (ref 0.61–1.24)
GFR calc Af Amer: 55 mL/min — ABNORMAL LOW (ref >60)
GFR calc non Af Amer: 47 mL/min — ABNORMAL LOW (ref >60)
GFR calc non Af Amer: 38 mL/min — ABNORMAL LOW (ref >60)
GFR, EST AFRICAN AMERICAN: 44 mL/min — AB (ref >60)
GLUCOSE: 164 mg/dL — AB (ref 65–99)
Glucose, Bld: 176 mg/dL — ABNORMAL HIGH (ref 65–99)
PHOSPHORUS: 2.3 mg/dL — AB (ref 2.5–4.6)
POTASSIUM: 3.8 mmol/L (ref 3.5–5.1)
Phosphorus: 2.2 mg/dL — ABNORMAL LOW (ref 2.5–4.6)
Potassium: 4 mmol/L (ref 3.5–5.1)
Sodium: 139 mmol/L (ref 135–145)
Sodium: 139 mmol/L (ref 135–145)

## 2017-09-25 LAB — GLUCOSE, CAPILLARY
GLUCOSE-CAPILLARY: 158 mg/dL — AB (ref 65–99)
Glucose-Capillary: 150 mg/dL — ABNORMAL HIGH (ref 65–99)
Glucose-Capillary: 155 mg/dL — ABNORMAL HIGH (ref 65–99)
Glucose-Capillary: 158 mg/dL — ABNORMAL HIGH (ref 65–99)
Glucose-Capillary: 159 mg/dL — ABNORMAL HIGH (ref 65–99)
Glucose-Capillary: 171 mg/dL — ABNORMAL HIGH (ref 65–99)

## 2017-09-25 LAB — MAGNESIUM: MAGNESIUM: 2.4 mg/dL (ref 1.7–2.4)

## 2017-09-25 LAB — HEPARIN LEVEL (UNFRACTIONATED): HEPARIN UNFRACTIONATED: 0.48 [IU]/mL (ref 0.30–0.70)

## 2017-09-25 MED ORDER — CEFEPIME HCL 2 G IJ SOLR
2.0000 g | INTRAMUSCULAR | Status: DC
  Administered 2017-09-26: 2 g via INTRAVENOUS
  Filled 2017-09-25 (×2): qty 2

## 2017-09-25 MED ORDER — INSULIN ASPART 100 UNIT/ML ~~LOC~~ SOLN
0.0000 [IU] | SUBCUTANEOUS | Status: DC
  Administered 2017-09-26: 5 [IU] via SUBCUTANEOUS
  Administered 2017-09-26: 8 [IU] via SUBCUTANEOUS
  Administered 2017-09-26 (×3): 5 [IU] via SUBCUTANEOUS
  Administered 2017-09-26: 3 [IU] via SUBCUTANEOUS
  Administered 2017-09-26: 8 [IU] via SUBCUTANEOUS
  Administered 2017-09-27: 5 [IU] via SUBCUTANEOUS
  Administered 2017-09-27: 11 [IU] via SUBCUTANEOUS

## 2017-09-25 MED ORDER — PRO-STAT SUGAR FREE PO LIQD
30.0000 mL | Freq: Two times a day (BID) | ORAL | Status: DC
  Administered 2017-09-25 – 2017-09-28 (×6): 30 mL
  Filled 2017-09-25 (×6): qty 30

## 2017-09-25 MED ORDER — VITAL HIGH PROTEIN PO LIQD
1000.0000 mL | ORAL | Status: DC
  Administered 2017-09-26 – 2017-09-27 (×2): 1000 mL
  Filled 2017-09-25 (×3): qty 1000

## 2017-09-25 MED ORDER — IPRATROPIUM-ALBUTEROL 0.5-2.5 (3) MG/3ML IN SOLN
3.0000 mL | Freq: Four times a day (QID) | RESPIRATORY_TRACT | Status: DC
  Administered 2017-09-25 (×2): 3 mL via RESPIRATORY_TRACT
  Filled 2017-09-25 (×2): qty 3

## 2017-09-25 NOTE — Progress Notes (Signed)
Called Crissie Sickles, MD as patients heart rate keeps maintaining in the 130's to 140's a fib and some SVT. MD verbal order to increase amio back to 60 mg/ hr ( 1 mg/min) dose and keep him there all night. Will administer and monitor closely.  Lucius Conn, RN

## 2017-09-25 NOTE — Progress Notes (Signed)
ANTICOAGULATION CONSULT NOTE - Follow Up Consult  Pharmacy Consult for Heparin Indication: atrial fibrillation  Allergies  Allergen Reactions  . Penicillins Hives, Nausea And Vomiting, Swelling and Other (See Comments)    Has patient had a PCN reaction causing immediate rash, facial/tongue/throat swelling, SOB or lightheadedness with hypotension: YES Has patient had a PCN reaction causing severe rash involving mucus membranes or skin necrosis: No Has patient had a PCN reaction that required hospitalization No Has patient had a PCN reaction occurring within the last 10 years: No If all of the above answers are "NO", then may proceed with Cephalosporin use.     Patient Measurements: Height: 5' (152.4 cm) Weight: 168 lb 3.4 oz (76.3 kg) IBW/kg (Calculated) : 50  Vital Signs: Temp: 97.5 F (36.4 C) (02/18 1100) Temp Source: Esophageal (02/18 1030) BP: 105/90 (02/18 1030) Pulse Rate: 25 (02/18 0943)  Labs: Recent Labs    09/23/17 0359  09/24/17 0402 09/24/17 1600 09/25/17 0314  HGB 7.9*  --  7.7*  --  7.9*  HCT 24.5*  --  24.8*  --  25.5*  PLT 230  --  226  --  207  HEPARINUNFRC 0.71*  --  0.47  --  0.48  CREATININE 1.89*   < > 1.68*  1.58* 1.49* 1.53*  1.51*   < > = values in this interval not displayed.    Estimated Creatinine Clearance: 42.8 mL/min (A) (by C-G formula based on SCr of 1.51 mg/dL (H)).  Medications: Heparin @ 1300 units/hr  Assessment:  61yom continues on heparin for afib. Heparin level is therapeutic at 0.4. Hgb stable at 7.9, platelets stable 207.  Goal of Therapy:  Heparin level 0.3-0.7 units/ml Monitor platelets by anticoagulation protocol: Yes   Plan:  1) Continue heparin at 1300 units/hr 2) Daily heparin level and CBC  Erin Hearing PharmD., BCPS Clinical Pharmacist 09/25/2017 11:36 AM

## 2017-09-25 NOTE — Progress Notes (Signed)
[facility] Progress Note Patient Name: JAVI BOLLMAN DOB: 08/16/1955 MRN: [id number]   Date of Service  09/25/2017  HPI/Events of Note  Blood glucose = 171. Currently on Lantus + AC Novolog SSI.  eICU Interventions  Will order: 1. Change SSI coverage to Q 4 hour moderate Novolog SSI.     Intervention Category Major Interventions: Hyperglycemia - active titration of insulin therapy  Lysle Dingwall 09/25/2017, 11:46 PM

## 2017-09-25 NOTE — Progress Notes (Signed)
Nutrition Follow-up  DOCUMENTATION CODES:   Obesity unspecified  INTERVENTION:   Increase Vital High Protein by 10 ml every 4 hours to goal of 40 ml/hr (960 ml/day) 30 ml Prostat BID  Provides: 1160 kcal, 114 grams protein, and 802 ml free water.   NUTRITION DIAGNOSIS:   Increased nutrient needs related to acute illness as evidenced by estimated needs. Ongoing.   GOAL:   Provide needs based on ASPEN/SCCM guidelines Progressing.   MONITOR:   Vent status, I & O's  REASON FOR ASSESSMENT:   Consult Enteral/tube feeding initiation and management  ASSESSMENT:   Pt with PMH of DM, CAD, afib admitted with flu A, now intubated with ARDS, multiorgan failure and septic shock. Pt starting CRRT today.   Spoke with RN/MD over the phone. MD would like to advance TF today.  CRRT stopped and may transition to iHD. Pt 9 L positive UOP: 22 ml x 24 hr  Medications reviewed and include: solu-cortef, SSI, lanuts Labs reviewed: PO4: 2.3 (L) CBG's: 158-158-159 MAP > 75  Diet Order:  No diet orders on file  EDUCATION NEEDS:   No education needs have been identified at this time  Skin:  Skin Assessment: (partial R foot amputation; MASD goin; mulitple scabs BLE) Skin Integrity Issues:: DTI DTI: sacrum  Last BM:  2/12 large  Height:   Ht Readings from Last 1 Encounters:  09/25/17 5' (1.524 m)    Weight:   Wt Readings from Last 1 Encounters:  09/25/17 168 lb 3.4 oz (76.3 kg)    Ideal Body Weight:  48.1 kg  BMI:  Body mass index is 32.85 kg/m.  Estimated Nutritional Needs:   Kcal:  1000-1200  Protein:  100-120 grams  Fluid:  >1.5 L/day  Maylon Peppers RD, LDN, CNSC [phone] Pager [phone] After Hours Pager

## 2017-09-25 NOTE — Progress Notes (Signed)
PULMONARY / CRITICAL CARE MEDICINE   Name: [name] MRN: [id number] DOB: 06-10-1956    ADMISSION DATE:  09/15/2017 CONSULTATION DATE: 09/18/2017  REFERRING MD: Neva Seat  Reason for consult: hypotension and worsening aA gradient  HISTORY OF PRESENT ILLNESS:        This is a 62 year old diabetic with a history of coronary disease and congestive heart failure with a baseline ejection fraction of 40-50%.  He was admitted on 2/8 complaining of generalized weakness and was found to be in atrial fibrillation with a rapid ventricular response with intermittent 7-8-second pauses.  A temporary pacing wire was placed.  He was noted to have infiltrates or CHF on his chest x-ray.  PCR for influenza a was positive.  He was started on a combination of Tamiflu as well as clindamycin and aztreonam due to a penicillin allergy.  He was diuresed but diuretics are currently being held as his creatinine has risen from approximately 0.9-2.5.  I was called today for hypotension and increasing oxygen requirements.  On my arrival the patient's mental status is very poor.  He is extremely lethargic and not giving any significant history.  He has had a nonproductive cough.  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  SUBJECTIVE:    CRRT stopped.  Temp pacer pulled.  Spontaneously converted to NSR. Failed PS wean due to apnea.  VITAL SIGNS: BP (!) 117/54   Pulse 66   Temp 98.4 F (36.9 C)   Resp (!) 28   Ht 5' (1.524 m)   Wt 76.3 kg (168 lb 3.4 oz)   SpO2 100%   BMI 32.85 kg/m   HEMODYNAMICS: CVP:  [4 mmHg] 4 mmHg  VENTILATOR SETTINGS: Vent Mode: PRVC FiO2 (%):  [30 %] 30 % Set Rate:  [28 bmp] 28 bmp Vt Set:  [400 mL] 400 mL PEEP:  [5 cmH20] 5 cmH20 Pressure Support:  [20 cmH20] 20 cmH20 Plateau Pressure:  [16 cmH20-20 cmH20] 16 cmH20  INTAKE / OUTPUT: I/O last 3 completed shifts: In: 3609.8 [I.V.:2196.4; Other:30;  NG/GT:183.3; IV Piggyback:1200] Out: 4268 [Urine:37; Emesis/NG output:350; Other:3841]  PHYSICAL EXAMINATION:   Gen:     Adult male, in NAD HEENT:  EOMI, sclera anicteric, OG ETT Neck:     No masses; no thyromegaly, No JVD Lungs:    Clear to auscultation bilaterally; Intubated and on full vent support CV:         RRR, no M/R/G Abd:      + bowel sounds; soft, non-tender; no palpable masses, no distension Ext:    Right metatarsal amputation, otherwise warm dry and intact Skin:      Warm and dry; no rash, pale Neuro:   Sedated, opens eyes to voice, follows some simple basic commands  LABS:  BMET Recent Labs  Lab 09/24/17 0402 09/24/17 1600 09/25/17 0314  NA 139  140 139 140  139  K 3.7  3.7 3.9 4.0  4.0  CL 105  106 106 105  104  CO2 21*  21* 22 21*  22  BUN 21*  21* 20 21*  20  CREATININE 1.68*  1.58* 1.49* 1.53*  1.51*  GLUCOSE 137*  137* 155* 177*  176*    Electrolytes Recent Labs  Lab 09/23/17 0359  09/24/17 0402 09/24/17 1600 09/25/17 0314  CALCIUM 8.1*   < > 8.2*  8.2* 8.2* 8.3*  8.2*  MG 2.3  --  2.4  --  2.4  PHOS 2.8   < > 1.9* 2.0* 2.2*   < > = values in this interval not displayed.    CBC Recent Labs  Lab 09/23/17 0359 09/24/17 0402 09/25/17 0314  WBC 10.3 8.4 7.3  HGB 7.9* 7.7* 7.9*  HCT 24.5* 24.8* 25.5*  PLT 230 226 207    Coag's No results for input(s): APTT, INR in the last 168 hours.  Sepsis Markers Recent Labs  Lab 09/18/17 1520 09/18/17 2051 09/19/17 0328 09/20/17 0345  LATICACIDVEN 1.2 1.3  --   --   PROCALCITON 1.54  --  1.85 12.09    ABG Recent Labs  Lab 09/21/17 0449 09/22/17 0355 09/24/17 0322  PHART 7.329* 7.368 7.412  PCO2ART 38.1 38.7 38.0  PO2ART 100 90.8 107    Liver Enzymes Recent Labs  Lab 09/22/17 0350  09/24/17 0402 09/24/17 1600 09/25/17 0314  AST 48*  --  22  --   --   ALT 15*  --  12*  --   --   ALKPHOS 60  --  47  --   --   BILITOT 0.5  --  0.8  --   --   ALBUMIN 1.6*   < >  1.8*  1.8* 1.8* 1.9*   < > = values in this interval not displayed.    Cardiac Enzymes No results for input(s): TROPONINI, PROBNP in the last 168 hours.  Glucose Recent Labs  Lab 09/24/17 1553 09/24/17 1956 09/24/17 2355 09/25/17 0327 09/25/17 0810 09/25/17 1158  GLUCAP 135* 146* 155* 150* 158* 158*    Imaging Dg Chest Port 1 View  Result Date: 09/25/2017 CLINICAL DATA:  Hypoxia EXAM: PORTABLE CHEST 1 VIEW COMPARISON:  September 24, 2017 FINDINGS: Endotracheal tube tip is 3.1 cm above the carina. Both right and left central catheter tips are in the superior vena cava. Nasogastric tube tip and side port below the diaphragm. No pneumothorax. There are small pleural effusions bilaterally. There is bibasilar atelectasis. Lungs elsewhere clear. Heart is upper normal in size with pulmonary vascularity within normal limits. Patient is status post coronary artery bypass grafting. There is aortic atherosclerosis. No adenopathy. No bone lesions. IMPRESSION: Tube and catheter positions as described without pneumothorax. Small pleural effusions bilaterally with bibasilar atelectasis. Stable cardiac silhouette. There is aortic atherosclerosis. Aortic Atherosclerosis (ICD10-I70.0). Electronically Signed   By: Lowella Grip III M.D.   On: 09/25/2017 08:15    ANTIBIOTICS: Tamiflu 2/8 > 2/12  Cefepime 2/12 >  Linezolid 2/12 >  Clinda 2/11 > 2/12 2/11 azactam>> 2/12   LINES/TUBES: ETT 2/12 >  L brachial art line 2/12 >  L IJ CVL 2/12 >   DISCUSSION:  This is a 62 year old diabetic with a history of LV dysfunction secondary to coronary artery disease as well as paroxysmal atrial fibrillation who presented with weakness, A. fib with rapid ventricular response, and intermittent pauses.  He has been found to be influenza A positive.  He has been placed on amiodarone and a temporary pacing wire has been placed. Respiratory status deteriorated 2/12; therefore, he required  intubation.  ASSESSMENT / PLAN:  Acute hypoxemic respiratory failure with bilateral pulmonary infiltrates Flu A, bacterial pneumonia. Plan: - Full vent support, failed SBT AM 2/18 due to apnea - Follow chest x-ray - Continue Zyvox and cefepime - ABG prn  Shock: etiology unclear. Likely combo cardiogenic, septic (PCT 12), hypovolemia (CVP 6 - 9). Lactic acid WNL. Concern he could actually be volume down. He is also on chronic  florinef, with a cortisol of 18. May not be supplementing this effectively. Plan:  - Continue stress steroids and supportive care - d/c metoprolol  Septic, cardiogenic shock Atrial fibrillation with Tachy/Brady syndrome: Temp pacer in place. Plans for PPM but currently too ill.  Episode of VT 2/16 am, pt asymptomatic, self resolved>> a fib with RVR>> resumed Neo, bolused with amio and increased gtt Plan: - Temp pacer pulled 2/18, cards planning PPM once over acute issues - Continue amiodarone, heparin per cards  AKI - oliguric AGMA - ?uremia Hypomagnesemia>> resolved - Renal following, CRRT stopped and planning to move to iHD if UOP does not improve - Replete electrolytes as needed - Monitor BMET  Anemia - chronic - Transfuse for Hgb < 7 - Follow CBC - Monitor for acute bleeding  DM - CBG monitoring and SSI  Acute metabolic encephalopathy - with sedation needs due to mechanical ventilation - Sedation:  Fentanyl gtt / precedex gtt / versed PRN -RASS goal: 0 to -1  Nutrition GI prophylaxis - Continue trickle feeds, PPI  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 Will need ENT consult once improved.   Family updated at bedside 2/17, none at bedside 2/18.  Prognosis remains guarded.   [name], [facility] Pager: [phone]  or [phone] 09/25/2017, 2:22 PM

## 2017-09-25 NOTE — Progress Notes (Signed)
Pt placed back on full vent support due to decreased RR.

## 2017-09-25 NOTE — Progress Notes (Signed)
Pt flipped back into afib, HR 130's and would not breathe if his eyes were closed. Respiratory notified. Previous vent settings initiated. Pt now resting comfortably. Will continue to monitor closely.   Lucius Conn, RN

## 2017-09-25 NOTE — Progress Notes (Signed)
Temporary Pacer and Femoral venous sheath removed intact. Pressure held for 10 minutes at groin site. Site is a level 0. Will continue to monitor closely.  Lucius Conn, RN

## 2017-09-25 NOTE — Procedures (Signed)
Admit: 09/15/2017 LOS: 10  78M with anuric AKI (BL SCr 1.6 - 1.7) on CRRT  Current CRRT Prescription: Start Date: 09/21/17 Catheter: TEMP R IJ placed 2/14 CCM BFR: 140 Pre Blood Pump: 500 4K DFR: 1000 4K Replacement Rate: 300 4K Goal UF: net even Anticoagulation: systemic heparin Clotting: infrequent  S: Little UOP Off pressors Weening Vent  O: 02/17 0701 - 02/18 0700 In: 2466 [I.V.:1472.7; NG/GT:163.3; IV Piggyback:800] Out: 2644 [Urine:22]  Filed Weights   09/23/17 0100 09/24/17 0256 09/25/17 0315  Weight: 77.8 kg (171 lb 8.3 oz) 77.3 kg (170 lb 6.7 oz) 76.3 kg (168 lb 3.4 oz)    Recent Labs  Lab 09/24/17 0402 09/24/17 1600 09/25/17 0314  NA 139  140 139 140  139  K 3.7  3.7 3.9 4.0  4.0  CL 105  106 106 105  104  CO2 21*  21* 22 21*  22  GLUCOSE 137*  137* 155* 177*  176*  BUN 21*  21* 20 21*  20  CREATININE 1.68*  1.58* 1.49* 1.53*  1.51*  CALCIUM 8.2*  8.2* 8.2* 8.3*  8.2*  PHOS 1.9* 2.0* 2.2*   Recent Labs  Lab 09/23/17 0359 09/24/17 0402 09/25/17 0314  WBC 10.3 8.4 7.3  HGB 7.9* 7.7* 7.9*  HCT 24.5* 24.8* 25.5*  MCV 81.9 82.9 84.7  PLT 230 226 207    Scheduled Meds: . atorvastatin  80 mg Per Tube Daily  . chlorhexidine gluconate (MEDLINE KIT)  15 mL Mouth Rinse BID  . dextrose  0.5 ampule Intravenous Once  . fentaNYL (SUBLIMAZE) injection  50 mcg Intravenous Once  . hydrocerin   Topical BID  . hydrocortisone sod succinate (SOLU-CORTEF) inj  50 mg Intravenous Q6H  . insulin aspart  0-9 Units Subcutaneous TID WC  . insulin glargine  10 Units Subcutaneous QHS  . ipratropium-albuterol  3 mL Nebulization BID  . mouth rinse  15 mL Mouth Rinse 10 times per day  . pantoprazole sodium  40 mg Per Tube Daily  . sodium chloride flush  3 mL Intravenous Q12H   Continuous Infusions: . sodium chloride 250 mL (09/24/17 2000)  . amiodarone Stopped (09/23/17 1159)  . amiodarone 30 mg/hr (09/25/17 0703)  . ceFEPime (MAXIPIME) IV Stopped  (09/24/17 2136)  . dexmedetomidine (PRECEDEX) IV infusion 0.7 mcg/kg/hr (09/25/17 0703)  . feeding supplement (VITAL HIGH PROTEIN) 1,000 mL (09/24/17 2000)  . fentaNYL infusion INTRAVENOUS 75 mcg/hr (09/24/17 2000)  . heparin 1,300 Units/hr (09/24/17 2000)  . heparin    . linezolid (ZYVOX) IV Stopped (09/24/17 2328)  . phenylephrine (NEO-SYNEPHRINE) Adult infusion Stopped (09/24/17 0100)  . dialysis replacement fluid (prismasate) 300 mL/hr at 09/25/17 0148  . dialysis replacement fluid (prismasate) 500 mL/hr at 09/24/17 2232  . dialysate (PRISMASATE) 1,000 mL/hr at 09/25/17 0332   PRN Meds:.sodium chloride, acetaminophen, camphor-menthol, fentaNYL, heparin, heparin, midazolam, nitroGLYCERIN, ondansetron (ZOFRAN) IV, polyvinyl alcohol, senna-docusate, sodium chloride flush  ABG    Component Value Date/Time   PHART 7.412 09/24/2017 0322   PCO2ART 38.0 09/24/2017 0322   PO2ART 107 09/24/2017 0322   HCO3 23.7 09/24/2017 0322   TCO2 21 (L) 09/20/2017 1159   ACIDBASEDEF 0.3 09/24/2017 0322   O2SAT 97.6 09/24/2017 0322    A/P  1. Dialysis dependent AoCKD3  2. AFib with RVR 3. Influenza A with VDRF 4. Septic Shock improving off pressors 5. ASCVD hx/o MI, PAD and CVA (hx/o CEA and CABG)  Cont CRRT all 4K fluids with net even UF.  Do  not restart if clots, will try to transition over to iHD.  WIll need to look to tunneling [facility] this week as well.   [name], MD [facility] pgr [phone]

## 2017-09-25 NOTE — Progress Notes (Signed)
Progress Note  Patient Name: [name] Date of Encounter: 09/25/2017  Primary Cardiologist: Mertie Moores, MD   Subjective   Remains intubated and sedated.  Inpatient Medications    Scheduled Meds: . atorvastatin  80 mg Per Tube Daily  . chlorhexidine gluconate (MEDLINE KIT)  15 mL Mouth Rinse BID  . dextrose  0.5 ampule Intravenous Once  . fentaNYL (SUBLIMAZE) injection  50 mcg Intravenous Once  . hydrocerin   Topical BID  . hydrocortisone sod succinate (SOLU-CORTEF) inj  50 mg Intravenous Q6H  . insulin aspart  0-9 Units Subcutaneous TID WC  . insulin glargine  10 Units Subcutaneous QHS  . ipratropium-albuterol  3 mL Nebulization BID  . mouth rinse  15 mL Mouth Rinse 10 times per day  . pantoprazole sodium  40 mg Per Tube Daily  . sodium chloride flush  3 mL Intravenous Q12H   Continuous Infusions: . sodium chloride 250 mL (09/24/17 2000)  . amiodarone Stopped (09/23/17 1159)  . amiodarone 30 mg/hr (09/25/17 0703)  . ceFEPime (MAXIPIME) IV Stopped (09/24/17 2136)  . dexmedetomidine (PRECEDEX) IV infusion 0.7 mcg/kg/hr (09/25/17 0703)  . feeding supplement (VITAL HIGH PROTEIN) 1,000 mL (09/24/17 2000)  . fentaNYL infusion INTRAVENOUS 75 mcg/hr (09/24/17 2000)  . heparin 1,300 Units/hr (09/24/17 2000)  . heparin    . linezolid (ZYVOX) IV Stopped (09/24/17 2328)  . phenylephrine (NEO-SYNEPHRINE) Adult infusion Stopped (09/24/17 0100)  . dialysis replacement fluid (prismasate) 300 mL/hr at 09/25/17 0148  . dialysis replacement fluid (prismasate) 500 mL/hr at 09/24/17 2232  . dialysate (PRISMASATE) 1,000 mL/hr at 09/25/17 0332   PRN Meds: sodium chloride, acetaminophen, camphor-menthol, fentaNYL, heparin, heparin, midazolam, nitroGLYCERIN, ondansetron (ZOFRAN) IV, polyvinyl alcohol, senna-docusate, sodium chloride flush   Vital Signs    Vitals:   09/25/17 0331 09/25/17 0400 09/25/17 0500 09/25/17 0600  BP: (!) 135/52 (!) 122/55 120/67 98/74  Pulse:  60 (!)  107   Resp:  (!) 28 (!) 28 (!) 28  Temp:  (!) 97.2 F (36.2 C) (!) 97.3 F (36.3 C) (!) 97.5 F (36.4 C)  TempSrc:  Esophageal Esophageal   SpO2: 100% 100% 100%   Weight:      Height:        Intake/Output Summary (Last 24 hours) at 09/25/2017 0734 Last data filed at 09/25/2017 0700 Gross per 24 hour  Intake 2466.03 ml  Output 2644 ml  Net -177.97 ml   Filed Weights   09/23/17 0100 09/24/17 0256 09/25/17 0315  Weight: 171 lb 8.3 oz (77.8 kg) 170 lb 6.7 oz (77.3 kg) 168 lb 3.4 oz (76.3 kg)    Telemetry    Atrial fib with occaisional NSR - Personally Reviewed  ECG    none - Personally Reviewed  Physical Exam   GEN: intubated and sedated Neck: unable to assess JVD Cardiac: IRRR, no murmurs, rubs, or gallops.  Respiratory: scattered rales GI: Soft, nontender, non-distended  MS: No edema; No deformity. Neuro:  Nonfocal  Psych: Normal affect   Labs    Chemistry Recent Labs  Lab 09/22/17 0350  09/24/17 0402 09/24/17 1600 09/25/17 0314  NA 133*   < > 139  140 139 140  139  K 4.0   < > 3.7  3.7 3.9 4.0  4.0  CL 100*   < > 105  106 106 105  104  CO2 19*   < > 21*  21* 22 21*  22  GLUCOSE 159*   < > 137*  137* 155*  177*  176*  BUN 43*   < > 21*  21* 20 21*  20  CREATININE 2.80*   < > 1.68*  1.58* 1.49* 1.53*  1.51*  CALCIUM 7.8*   < > 8.2*  8.2* 8.2* 8.3*  8.2*  PROT 6.2*  --  5.9*  --   --   ALBUMIN 1.6*   < > 1.8*  1.8* 1.8* 1.9*  AST 48*  --  22  --   --   ALT 15*  --  12*  --   --   ALKPHOS 60  --  47  --   --   BILITOT 0.5  --  0.8  --   --   GFRNONAA 22*   < > 42*  45* 48* 47*  47*  GFRAA 26*   < > 48*  52* 56* 54*  55*  ANIONGAP 14   < > _0 < > = values in this interval not displayed.     Hematology Recent Labs  Lab 09/23/17 0359 09/24/17 0402 09/25/17 0314  WBC 10.3 8.4 7.3  RBC 2.99* 2.99* 3.01*  HGB 7.9* 7.7* 7.9*  HCT 24.5* 24.8* 25.5*  MCV 81.9 82.9 84.7  MCH 26.4 25.8* 26.2  MCHC 32.2 31.0 31.0    RDW 15.5 15.7* 15.9*  PLT 230 226 207    Cardiac EnzymesNo results for input(s): TROPONINI in the last 168 hours. No results for input(s): TROPIPOC in the last 168 hours.   BNPNo results for input(s): BNP, PROBNP in the last 168 hours.   DDimer No results for input(s): DDIMER in the last 168 hours.   Radiology    Dg Chest Port 1 View  Result Date: 09/24/2017 CLINICAL DATA:  Respiratory failure and abdominal distension. EXAM: PORTABLE CHEST 1 VIEW COMPARISON:  09/23/2017 FINDINGS: Endotracheal tube has tip 3.6 cm above the carina. Bilateral IJ central venous catheters are unchanged. Nasogastric tube tip not seen and otherwise unchanged. Lungs are adequately inflated with persistent mild infrahilar/bibasilar opacification with slight interval improvement left base likely atelectasis and small left effusion, although infection is possible. Possible component of improving edema. Mild stable cardiomegaly. Remainder of the exam is unchanged. IMPRESSION: Mild bibasilar opacification with slight improvement in the left base. Findings likely due to atelectasis and small left effusion, although infection is possible. Possible component of improving edema. Tubes and lines as described. Electronically Signed   By: Marin Olp M.D.   On: 09/24/2017 07:09   Dg Abd Portable 1v  Result Date: 09/24/2017 CLINICAL DATA:  Abdominal distention. EXAM: PORTABLE ABDOMEN - 1 VIEW COMPARISON:  09/22/2017 FINDINGS: Left femoral cardiac pacer lead unchanged. Nasogastric tube is present with tip just right of midline in the mid abdomen likely within the distal stomach. Bowel gas pattern is nonobstructive with relative paucity of bowel gas present. No mass or mass effect. No free peritoneal air. Moderate curvature of the thoracolumbar spine convex right with moderate degenerative changes. IMPRESSION: Nonobstructive bowel gas pattern with relative paucity of bowel gas present. Nasogastric tube with tip over the right mid  abdomen likely in the distal stomach. Electronically Signed   By: Marin Olp M.D.   On: 09/24/2017 07:16    Cardiac Studies   none  Patient Profile     62 y.o. male admitted with influenza and respiratory failure and atrial fib with a RVR and slow VR and pauses, s/p temporary PM  Assessment & Plan    1.  PAF with pauses - these have improved markedly. He is going in and out of NSR but pauses minimal. We will take out temporary PM wire today. 2. VDRF - he has come off of pressors and appears to be improving. Additional recs by CCM 3. Sinus node dysfunction - once infectious issues resolved I would anticipate placement of PPM   For questions or updates, please contact [place] Please consult [email or web address] for contact info under Cardiology/STEMI.      Signed, Cristopher Peru, MD  09/25/2017, 7:34 AM  Patient ID: [name], male   DOB: 10/19/1955, 62 y.o.   MRN: [id number]

## 2017-09-25 NOTE — Progress Notes (Signed)
Called by infection control to remove the Droplet Precautions. Will remove.  Lucius Conn, RN

## 2017-09-25 NOTE — Progress Notes (Signed)
CVVHD filter clotted. Therapy stopped per verbal order from [place], MD. HD access now Heparin locked and capped. Will continue to monitor closely.  Lucius Conn, RN

## 2017-09-26 ENCOUNTER — Inpatient Hospital Stay (HOSPITAL_COMMUNITY): Payer: Medicare (Managed Care)

## 2017-09-26 DIAGNOSIS — L8990 Pressure ulcer of unspecified site, unspecified stage: Secondary | ICD-10-CM | POA: Diagnosis present

## 2017-09-26 LAB — CBC
HCT: 25 % — ABNORMAL LOW (ref 39.0–52.0)
HEMOGLOBIN: 7.9 g/dL — AB (ref 13.0–17.0)
MCH: 26.9 pg (ref 26.0–34.0)
MCHC: 31.6 g/dL (ref 30.0–36.0)
MCV: 85 fL (ref 78.0–100.0)
Platelets: 216 10*3/uL (ref 150–400)
RBC: 2.94 MIL/uL — AB (ref 4.22–5.81)
RDW: 16.3 % — ABNORMAL HIGH (ref 11.5–15.5)
WBC: 10.1 10*3/uL (ref 4.0–10.5)

## 2017-09-26 LAB — RENAL FUNCTION PANEL
ALBUMIN: 2 g/dL — AB (ref 3.5–5.0)
ANION GAP: 13 (ref 5–15)
Albumin: 2 g/dL — ABNORMAL LOW (ref 3.5–5.0)
Anion gap: 13 (ref 5–15)
BUN: 36 mg/dL — ABNORMAL HIGH (ref 6–20)
BUN: 52 mg/dL — ABNORMAL HIGH (ref 6–20)
CALCIUM: 8.7 mg/dL — AB (ref 8.9–10.3)
CO2: 22 mmol/L (ref 22–32)
CO2: 22 mmol/L (ref 22–32)
CREATININE: 2.3 mg/dL — AB (ref 0.61–1.24)
CREATININE: 2.67 mg/dL — AB (ref 0.61–1.24)
Calcium: 8.6 mg/dL — ABNORMAL LOW (ref 8.9–10.3)
Chloride: 105 mmol/L (ref 101–111)
Chloride: 104 mmol/L (ref 101–111)
GFR calc non Af Amer: 29 mL/min — ABNORMAL LOW (ref >60)
GFR, EST AFRICAN AMERICAN: 33 mL/min — AB (ref >60)
GFR, EST AFRICAN AMERICAN: 28 mL/min — AB (ref >60)
GFR, EST NON AFRICAN AMERICAN: 24 mL/min — AB (ref >60)
Glucose, Bld: 205 mg/dL — ABNORMAL HIGH (ref 65–99)
Glucose, Bld: 291 mg/dL — ABNORMAL HIGH (ref 65–99)
PHOSPHORUS: 3 mg/dL (ref 2.5–4.6)
Phosphorus: 2.7 mg/dL (ref 2.5–4.6)
Potassium: 3.7 mmol/L (ref 3.5–5.1)
Potassium: 3.4 mmol/L — ABNORMAL LOW (ref 3.5–5.1)
SODIUM: 140 mmol/L (ref 135–145)
SODIUM: 139 mmol/L (ref 135–145)

## 2017-09-26 LAB — PROCALCITONIN: PROCALCITONIN: 0.5 ng/mL

## 2017-09-26 LAB — PHOSPHORUS
PHOSPHORUS: 3.1 mg/dL (ref 2.5–4.6)
PHOSPHORUS: 2.6 mg/dL (ref 2.5–4.6)

## 2017-09-26 LAB — GLUCOSE, CAPILLARY
GLUCOSE-CAPILLARY: 211 mg/dL — AB (ref 65–99)
GLUCOSE-CAPILLARY: 237 mg/dL — AB (ref 65–99)
GLUCOSE-CAPILLARY: 248 mg/dL — AB (ref 65–99)
Glucose-Capillary: 171 mg/dL — ABNORMAL HIGH (ref 65–99)
Glucose-Capillary: 202 mg/dL — ABNORMAL HIGH (ref 65–99)
Glucose-Capillary: 261 mg/dL — ABNORMAL HIGH (ref 65–99)
Glucose-Capillary: 273 mg/dL — ABNORMAL HIGH (ref 65–99)
Glucose-Capillary: 240 mg/dL — ABNORMAL HIGH (ref 65–99)

## 2017-09-26 LAB — IRON AND TIBC
Iron: 73 ug/dL (ref 45–182)
SATURATION RATIOS: 52 % — AB (ref 17.9–39.5)
TIBC: 141 ug/dL — AB (ref 250–450)
UIBC: 68 ug/dL

## 2017-09-26 LAB — HEPARIN LEVEL (UNFRACTIONATED): Heparin Unfractionated: 0.56 IU/mL (ref 0.30–0.70)

## 2017-09-26 LAB — FERRITIN: Ferritin: 816 ng/mL — ABNORMAL HIGH (ref 24–336)

## 2017-09-26 LAB — MAGNESIUM
MAGNESIUM: 2.4 mg/dL (ref 1.7–2.4)
MAGNESIUM: 2.3 mg/dL (ref 1.7–2.4)

## 2017-09-26 MED ORDER — POTASSIUM CHLORIDE 10 MEQ/50ML IV SOLN
10.0000 meq | INTRAVENOUS | Status: AC
  Administered 2017-09-26 (×2): 10 meq via INTRAVENOUS
  Filled 2017-09-26 (×2): qty 50

## 2017-09-26 MED ORDER — DARBEPOETIN ALFA 60 MCG/0.3ML IJ SOSY
60.0000 ug | PREFILLED_SYRINGE | INTRAMUSCULAR | Status: DC
  Filled 2017-09-26: qty 0.3

## 2017-09-26 MED ORDER — HYDROCORTISONE NA SUCCINATE PF 100 MG IJ SOLR
50.0000 mg | Freq: Two times a day (BID) | INTRAMUSCULAR | Status: DC
  Administered 2017-09-26 – 2017-09-28 (×4): 50 mg via INTRAVENOUS
  Filled 2017-09-26 (×4): qty 2

## 2017-09-26 MED ORDER — FENTANYL CITRATE (PF) 100 MCG/2ML IJ SOLN: 100.0000 ug | INTRAMUSCULAR | Status: DC | PRN

## 2017-09-26 MED ORDER — FENTANYL CITRATE (PF) 100 MCG/2ML IJ SOLN
100.0000 ug | INTRAMUSCULAR | Status: DC | PRN
  Administered 2017-09-26 – 2017-09-27 (×5): 100 ug via INTRAVENOUS
  Filled 2017-09-26 (×5): qty 2

## 2017-09-26 MED ORDER — MIDAZOLAM HCL 2 MG/2ML IJ SOLN: 2.0000 mg | INTRAMUSCULAR | Status: DC | PRN

## 2017-09-26 MED ORDER — IPRATROPIUM-ALBUTEROL 0.5-2.5 (3) MG/3ML IN SOLN
3.0000 mL | Freq: Three times a day (TID) | RESPIRATORY_TRACT | Status: DC
  Administered 2017-09-26 – 2017-09-30 (×12): 3 mL via RESPIRATORY_TRACT
  Filled 2017-09-26 (×13): qty 3

## 2017-09-26 MED ORDER — POTASSIUM CHLORIDE 10 MEQ/100ML IV SOLN: 10.0000 meq | INTRAVENOUS | Status: DC

## 2017-09-26 NOTE — Progress Notes (Signed)
Admit: 09/15/2017 LOS: 11  [name] with anuric AKI (BL SCr 1.6 - 1.7) on CRRT  Subjective:  CRRT held after clotting yesterday No significant UOP, anuric Remains off pressors, stable BP Morning labs with K3.7, bicarbonate 22, BUN 36, creatinine 2.3 Intubated still, 30% FiO2 on PRVC CVP 8 this morning  02/18 0701 - 02/19 0700 In: 2832.6 [I.V.:1494.6; NG/GT:638; IV Piggyback:700] Out: 194 [Urine:35]  Filed Weights   09/24/17 0256 09/25/17 0315 09/26/17 0200  Weight: 77.3 kg (170 lb 6.7 oz) 76.3 kg (168 lb 3.4 oz) 78.6 kg (173 lb 4.5 oz)    Scheduled Meds: . atorvastatin  80 mg Per Tube Daily  . chlorhexidine gluconate (MEDLINE KIT)  15 mL Mouth Rinse BID  . dextrose  0.5 ampule Intravenous Once  . feeding supplement (PRO-STAT SUGAR FREE 64)  30 mL Per Tube BID  . fentaNYL (SUBLIMAZE) injection  50 mcg Intravenous Once  . hydrocerin   Topical BID  . hydrocortisone sod succinate (SOLU-CORTEF) inj  50 mg Intravenous Q6H  . insulin aspart  0-15 Units Subcutaneous Q4H  . insulin glargine  10 Units Subcutaneous QHS  . ipratropium-albuterol  3 mL Nebulization TID  . mouth rinse  15 mL Mouth Rinse 10 times per day  . pantoprazole sodium  40 mg Per Tube Daily  . sodium chloride flush  3 mL Intravenous Q12H   Continuous Infusions: . sodium chloride 250 mL (09/24/17 2000)  . amiodarone Stopped (09/23/17 1159)  . amiodarone 60 mg/hr (09/26/17 0551)  . ceFEPime (MAXIPIME) IV    . dexmedetomidine (PRECEDEX) IV infusion 0.4 mcg/kg/hr (09/26/17 0548)  . feeding supplement (VITAL HIGH PROTEIN) 1,000 mL (09/26/17 0100)  . fentaNYL infusion INTRAVENOUS 25 mcg/hr (09/26/17 0700)  . heparin 1,300 Units/hr (09/24/17 2000)  . linezolid (ZYVOX) IV Stopped (09/25/17 2257)  . phenylephrine (NEO-SYNEPHRINE) Adult infusion Stopped (09/24/17 0100)   PRN Meds:.sodium chloride, acetaminophen, camphor-menthol, fentaNYL, heparin, midazolam, nitroGLYCERIN, ondansetron (ZOFRAN) IV, polyvinyl alcohol,  senna-docusate, sodium chloride flush  Current Labs: reviewed    Physical Exam:  Blood pressure (!) 124/52, pulse 66, temperature (!) 97.3 F (36.3 C), resp. rate 18, height 5' (1.524 m), weight 78.6 kg (173 lb 4.5 oz), SpO2 100 %. 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 Dialysis dependent AoCKD3 off CRRT 09/25/08 2. AFib with RVR; bradytherapy was with temp pacer; likely to need PPM when improved 3. Influenza A with VDRF 4. Septic Shock improving off pressors 5. ASCVD hx/o MI, PAD and CVA (hx/o CEA and CABG) 6. Anemia 7. DM2 8.   P 1. We will attempt to transition to intermittent hemodialysis, first treatment tomorrow 2. Start with 3K bath, UF 2L, using temporary HD catheter 3. After dialysis tomorrow will remove catheter and will request VVS evaluation for tunneled catheter 4. Check [employer], Start ESA with HD   Joseph Grippe MD 09/26/2017, 7:57 AM  Recent Labs  Lab 09/25/17 0314 09/25/17 1520 09/26/17 0322  NA 140  139 139 140  K 4.0  4.0 3.8 3.7  CL 105  104 105 105  CO2 21*  22 19* 22  GLUCOSE 177*  176* 164* 205*  BUN 21*  20 26* 36*  CREATININE 1.53*  1.51* 1.83* 2.30*  CALCIUM 8.3*  8.2* 8.4* 8.7*  PHOS 2.2* 2.3* 3.1  3.0   Recent Labs  Lab 09/24/17 0402 09/25/17  5732 09/26/17 0322  WBC 8.4 7.3 10.1  HGB 7.7* 7.9* 7.9*  HCT 24.8* 25.5* 25.0*  MCV 82.9 84.7 85.0  PLT 226 207 216

## 2017-09-26 NOTE — Progress Notes (Signed)
Called elink regarding HR into 150s in afib, elink recommended cardiology consult on medication due to temp. Pacer pulled yesterday and nature of admission cardiac-wise. Cardiology stated to wait and hold amio at current dose of 60 mcg. Bartholomew Crews, RN 09/26/2017 7:21 PM

## 2017-09-26 NOTE — Progress Notes (Signed)
   I was paged about pt HR in the 120's-150's. BP stable. Amiodarone drip infusing. Discussed with Dr. Marlou Porch. Pt has been identified as having tachy-brady syndrome. In light of his issues with bradycardia and pauses we do not want to give additional rate lowering medication at this time. Hopefully his HR will calm down. Continue to monitor.   Daune Perch, AGNP-C [facility] 09/26/2017  7:38 PM Pager: [phone]

## 2017-09-26 NOTE — Progress Notes (Signed)
[name], Sister, has been called and made aware that patient is transferring.

## 2017-09-26 NOTE — Progress Notes (Signed)
Pt weaned off sedation by RN, pt placed on SBT 5/5 by RT.  Pt tolerating well at this time.

## 2017-09-26 NOTE — Progress Notes (Signed)
Report called to 3MW. Patient transferring to [id number].  Lucius Conn, RN

## 2017-09-26 NOTE — Progress Notes (Signed)
Wasted 200 ml Fentanyl in sink with Penny Pia, RN.  Lucius Conn, RN

## 2017-09-26 NOTE — Progress Notes (Signed)
Pt transported on vent from 2M04 to 3M04. Pt's vitals remained stable throughout.

## 2017-09-26 NOTE — Progress Notes (Addendum)
PULMONARY / CRITICAL CARE MEDICINE   Name: [name] MRN: [id number] DOB: 1955-10-18    ADMISSION DATE:  09/15/2017 CONSULTATION DATE: 09/18/2017  REFERRING MD: Neva Seat  Reason for consult: hypotension and worsening aA gradient  HISTORY OF PRESENT ILLNESS:        This is a 62 year old diabetic with a history of coronary disease and congestive heart failure with a baseline ejection fraction of 40-50%.  He was admitted on 2/8 complaining of generalized weakness and was found to be in atrial fibrillation with a rapid ventricular response with intermittent 7-8-second pauses.  A temporary pacing wire was placed.  He was noted to have infiltrates or CHF on his chest x-ray.  PCR for influenza a was positive.  He was started on a combination of Tamiflu as well as clindamycin and aztreonam due to a penicillin allergy.  He was diuresed but diuretics are currently being held as his creatinine has risen from approximately 0.9-2.5.  I was called today for hypotension and increasing oxygen requirements.  On my arrival the patient's mental status is very poor.  He is extremely lethargic and not giving any significant history.  He has had a nonproductive cough.  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  SUBJECTIVE:     No acute events overnight. Remains off CVVH, anuric  VITAL SIGNS: BP (!) 121/53   Pulse 61   Temp (!) 97.2 F (36.2 C) (Esophageal)   Resp (!) 23   Ht 5' (1.524 m)   Wt 173 lb 4.5 oz (78.6 kg)   SpO2 100%   BMI 33.84 kg/m   HEMODYNAMICS: CVP:  [8 mmHg] 8 mmHg  VENTILATOR SETTINGS: Vent Mode: PRVC FiO2 (%):  [30 %] 30 % Set Rate:  [20 bmp-28 bmp] 20 bmp Vt Set:  [400 mL] 400 mL PEEP:  [5 cmH20] 5 cmH20 Plateau Pressure:  [15 cmH20-17 cmH20] 16 cmH20  INTAKE / OUTPUT: I/O last 3 completed shifts: In: 30 [I.V.:2285; NG/GT:768; IV Piggyback:1100] Out: 1452 [Urine:50; Other:1402]  PHYSICAL  EXAMINATION:   Gen:      No acute distress HEENT:  EOMI, sclera anicteric, ETT Neck:     No masses; no thyromegaly Lungs:    Clear to auscultation bilaterally; normal respiratory effort CV:         Regular rate and rhythm; no murmurs Abd:      + bowel sounds; soft, non-tender; no palpable masses, no distension Ext:    No edema; adequate peripheral perfusion Skin:      Warm and dry; no rash Neuro: Sedated, no distress  LABS:  BMET Recent Labs  Lab 09/25/17 0314 09/25/17 1520 09/26/17 0322  NA 140  139 139 140  K 4.0  4.0 3.8 3.7  CL 105  104 105 105  CO2 21*  22 19* 22  BUN 21*  20 26* 36*  CREATININE 1.53*  1.51* 1.83* 2.30*  GLUCOSE 177*  176* 164* 205*    Electrolytes Recent Labs  Lab 09/24/17 0402  09/25/17 0314 09/25/17 1520 09/26/17 0322  CALCIUM 8.2*  8.2*   < > 8.3*  8.2* 8.4* 8.7*  MG 2.4  --  2.4  --  2.4  PHOS 1.9*   < > 2.2* 2.3* 3.1  3.0   < > = values in this interval not displayed.    CBC Recent Labs  Lab 09/24/17 0402 09/25/17 0314 09/26/17 0322  WBC 8.4 7.3  10.1  HGB 7.7* 7.9* 7.9*  HCT 24.8* 25.5* 25.0*  PLT 226 207 216    Coag's No results for input(s): APTT, INR in the last 168 hours.  Sepsis Markers Recent Labs  Lab 09/20/17 0345  PROCALCITON 12.09    ABG Recent Labs  Lab 09/22/17 0355 09/24/17 0322 09/25/17 1633  PHART 7.368 7.412 7.405  PCO2ART 38.7 38.0 33.4  PO2ART 90.8 107 105.0    Liver Enzymes Recent Labs  Lab 09/22/17 0350  09/24/17 0402  09/25/17 0314 09/25/17 1520 09/26/17 0322  AST 48*  --  22  --   --   --   --   ALT 15*  --  12*  --   --   --   --   ALKPHOS 60  --  47  --   --   --   --   BILITOT 0.5  --  0.8  --   --   --   --   ALBUMIN 1.6*   < > 1.8*  1.8*   < > 1.9* 1.9* 2.0*   < > = values in this interval not displayed.    Cardiac Enzymes No results for input(s): TROPONINI, PROBNP in the last 168 hours.  Glucose Recent Labs  Lab 09/25/17 1158 09/25/17 1533 09/25/17 1949  09/25/17 2328 09/26/17 0338 09/26/17 0739  GLUCAP 158* 159* 171* 211* 171* 202*    Imaging Dg Chest Port 1 View  Result Date: 09/26/2017 CLINICAL DATA:  Respiratory failure EXAM: PORTABLE CHEST 1 VIEW COMPARISON:  09/25/2017 FINDINGS: Support devices are stable. Cardiomegaly with vascular congestion and bilateral lower lobe airspace opacities. Possible small layering effusions bilaterally. Slight worsening in aeration in in the mid and lower lung since prior study. IMPRESSION: Increasing bilateral mid and lower lung airspace opacities. This could reflect edema or infection. Small bilateral layering effusions. Electronically Signed   By: Rolm Baptise M.D.   On: 09/26/2017 08:48    ANTIBIOTICS: Tamiflu 2/8 > 2/12  Cefepime 2/12 >  Linezolid 2/12 >  Clinda 2/11 > 2/12 2/11 azactam>> 2/12  LINES/TUBES: ETT 2/12 >  L brachial art line 2/12 >  L IJ CVL 2/12 >   DISCUSSION:  This is a 62 year old diabetic with a history of LV dysfunction secondary to coronary artery disease as well as paroxysmal atrial fibrillation who presented with weakness, A. fib with rapid ventricular response, and intermittent pauses.  He has been found to be influenza A positive.  He has been placed on amiodarone and a temporary pacing wire has been placed. Respiratory status deteriorated 2/12; therefore, he required intubation.  ASSESSMENT / PLAN:  Acute hypoxemic respiratory failure with bilateral pulmonary infiltrates Flu A, bacterial pneumonia. Plan: Still apneic on SBTs. Reduce sedation Follow chest x-ray Continue Zyvox and cefepime day 8. Stop after today. Pct is improved.  Septic, cardiogenic shock Intermittent atrial fibrillation with Tachy/Brady syndrome s/p Temp pacer in place. Plans for PPM but currently too ill.  Pt is reportedly on fludrocortisone 0.1mg  at home Plan: Temp pacer pulled 2/18, cards planning PPM once over acute issues Continue amiodarone, heparin per cards Reduce stress dose  steroids.  AKI - oliguric AGMA - ?uremia Hypomagnesemia>> resolved Renal following, CRRT stopped. Likely move to HD and tunneled cath palcement Replete electrolytes as needed Monitor BMET  Anemia - chronic Follow CBC  DM SSI, Lantus  Acute metabolic encephalopathy - with sedation needs due to mechanical ventilation DC sedaation drips. 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 Will need ENT consult once improved.   Family updated at bedside 2/17, none at bedside 2/18, 2.19.  Prognosis remains guarded.  The patient is critically ill with multiple organ system failure and requires high complexity decision making for assessment and support, frequent evaluation and titration of therapies, advanced monitoring, review of radiographic studies and interpretation of complex data.   Critical Care Time devoted to patient care services, exclusive of separately billable procedures, described in this note is 35 minutes.   Marshell Garfinkel MD [facility] Pager [phone] If no answer or after 3pm call: [phone] 09/26/2017, 9:39 AM

## 2017-09-26 NOTE — Progress Notes (Signed)
ANTICOAGULATION CONSULT NOTE - Follow Up Consult  Pharmacy Consult for Heparin Indication: atrial fibrillation  Allergies  Allergen Reactions  . Penicillins Hives, Nausea And Vomiting, Swelling and Other (See Comments)    Has patient had a PCN reaction causing immediate rash, facial/tongue/throat swelling, SOB or lightheadedness with hypotension: YES Has patient had a PCN reaction causing severe rash involving mucus membranes or skin necrosis: No Has patient had a PCN reaction that required hospitalization No Has patient had a PCN reaction occurring within the last 10 years: No If all of the above answers are "NO", then may proceed with Cephalosporin use.     Patient Measurements: Height: 5' (152.4 cm) Weight: 173 lb 4.5 oz (78.6 kg) IBW/kg (Calculated) : 50  Vital Signs: Temp: 96.8 F (36 C) (02/19 0930) Temp Source: Esophageal (02/19 0830) BP: 138/59 (02/19 0930) Pulse Rate: 71 (02/19 0930)  Labs: Recent Labs    09/24/17 0402  09/25/17 0314 09/25/17 1520 09/26/17 0322  HGB 7.7*  --  7.9*  --  7.9*  HCT 24.8*  --  25.5*  --  25.0*  PLT 226  --  207  --  216  HEPARINUNFRC 0.47  --  0.48  --  0.56  CREATININE 1.68*  1.58*   < > 1.53*  1.51* 1.83* 2.30*   < > = values in this interval not displayed.    Estimated Creatinine Clearance: 28.5 mL/min (A) (by C-G formula based on SCr of 2.3 mg/dL (H)).   Assessment:  61yom continues on heparin for afib. Heparin level continues to be therapeutic at 0.5. Hgb stable at 7.9, platelets stable 216.  PNA: patient continues on cefepime and zyvox, afebrile overnight with wbc of 10. CRRT stopped yesterday 2/18, planning iHD tomorrow. Antibiotic doses adjusted yesterday. Cultures have been no growth.   Goal of Therapy:  Heparin level 0.3-0.7 units/ml Monitor platelets by anticoagulation protocol: Yes   Plan:  1) Continue heparin at 1300 units/hr 2) Daily heparin level and CBC 3) Continue cefepime 2g q24 hours 4) No change in  zyvox  Erin Hearing PharmD., BCPS Clinical Pharmacist 09/26/2017 9:59 AM

## 2017-09-27 ENCOUNTER — Inpatient Hospital Stay (HOSPITAL_COMMUNITY): Payer: Medicare (Managed Care)

## 2017-09-27 LAB — RENAL FUNCTION PANEL
Albumin: 2.1 g/dL — ABNORMAL LOW (ref 3.5–5.0)
Anion gap: 12 (ref 5–15)
BUN: 65 mg/dL — ABNORMAL HIGH (ref 6–20)
CALCIUM: 8.7 mg/dL — AB (ref 8.9–10.3)
CO2: 23 mmol/L (ref 22–32)
CREATININE: 3.06 mg/dL — AB (ref 0.61–1.24)
Chloride: 104 mmol/L (ref 101–111)
GFR, EST AFRICAN AMERICAN: 23 mL/min — AB (ref >60)
GFR, EST NON AFRICAN AMERICAN: 20 mL/min — AB (ref >60)
Glucose, Bld: 345 mg/dL — ABNORMAL HIGH (ref 65–99)
Phosphorus: 2.2 mg/dL — ABNORMAL LOW (ref 2.5–4.6)
Potassium: 3.7 mmol/L (ref 3.5–5.1)
SODIUM: 139 mmol/L (ref 135–145)

## 2017-09-27 LAB — PHOSPHORUS: Phosphorus: 1.4 mg/dL — ABNORMAL LOW (ref 2.5–4.6)

## 2017-09-27 LAB — CBC
HCT: 27.2 % — ABNORMAL LOW (ref 39.0–52.0)
HEMOGLOBIN: 8.5 g/dL — AB (ref 13.0–17.0)
MCH: 26.8 pg (ref 26.0–34.0)
MCHC: 31.3 g/dL (ref 30.0–36.0)
MCV: 85.8 fL (ref 78.0–100.0)
Platelets: 217 10*3/uL (ref 150–400)
RBC: 3.17 MIL/uL — ABNORMAL LOW (ref 4.22–5.81)
RDW: 16.3 % — AB (ref 11.5–15.5)
WBC: 21.5 10*3/uL — AB (ref 4.0–10.5)

## 2017-09-27 LAB — GLUCOSE, CAPILLARY
GLUCOSE-CAPILLARY: 318 mg/dL — AB (ref 65–99)
GLUCOSE-CAPILLARY: 217 mg/dL — AB (ref 65–99)
GLUCOSE-CAPILLARY: 245 mg/dL — AB (ref 65–99)
Glucose-Capillary: 142 mg/dL — ABNORMAL HIGH (ref 65–99)
Glucose-Capillary: 309 mg/dL — ABNORMAL HIGH (ref 65–99)
Glucose-Capillary: 235 mg/dL — ABNORMAL HIGH (ref 65–99)

## 2017-09-27 LAB — PROCALCITONIN: Procalcitonin: 0.49 ng/mL

## 2017-09-27 LAB — MAGNESIUM
MAGNESIUM: 2.4 mg/dL (ref 1.7–2.4)
Magnesium: 1.8 mg/dL (ref 1.7–2.4)

## 2017-09-27 LAB — HEPARIN LEVEL (UNFRACTIONATED): HEPARIN UNFRACTIONATED: 0.51 [IU]/mL (ref 0.30–0.70)

## 2017-09-27 MED ORDER — INSULIN GLARGINE 100 UNIT/ML ~~LOC~~ SOLN
15.0000 [IU] | Freq: Every day | SUBCUTANEOUS | Status: DC
  Administered 2017-09-27 – 2017-10-07 (×9): 15 [IU] via SUBCUTANEOUS
  Filled 2017-09-27 (×12): qty 0.15

## 2017-09-27 MED ORDER — ALBUMIN HUMAN 5 % IV SOLN
25.0000 g | Freq: Once | INTRAVENOUS | Status: DC
  Filled 2017-09-27: qty 250

## 2017-09-27 MED ORDER — HEPARIN SODIUM (PORCINE) 1000 UNIT/ML DIALYSIS: 20.0000 [IU]/kg | INTRAMUSCULAR | Status: DC | PRN

## 2017-09-27 MED ORDER — SENNA 8.6 MG PO TABS
1.0000 | ORAL_TABLET | Freq: Two times a day (BID) | ORAL | Status: DC
  Administered 2017-09-27 – 2017-10-22 (×20): 8.6 mg via ORAL
  Filled 2017-09-27 (×30): qty 1

## 2017-09-27 MED ORDER — POTASSIUM PHOSPHATES 15 MMOLE/5ML IV SOLN
20.0000 mmol | Freq: Once | INTRAVENOUS | Status: AC
  Administered 2017-09-27: 20 mmol via INTRAVENOUS
  Filled 2017-09-27: qty 6.67

## 2017-09-27 MED ORDER — BISACODYL 10 MG RE SUPP
10.0000 mg | Freq: Every day | RECTAL | Status: DC | PRN
  Administered 2017-09-27: 10 mg via RECTAL
  Filled 2017-09-27: qty 1

## 2017-09-27 MED ORDER — ALBUMIN HUMAN 25 % IV SOLN
INTRAVENOUS | Status: AC
  Administered 2017-09-27: 25 g via INTRAVENOUS
  Filled 2017-09-27: qty 100

## 2017-09-27 MED ORDER — SENNOSIDES-DOCUSATE SODIUM 8.6-50 MG PO TABS
1.0000 | ORAL_TABLET | Freq: Every day | ORAL | Status: DC
  Administered 2017-09-27 – 2017-10-22 (×15): 1 via ORAL
  Filled 2017-09-27 (×17): qty 1

## 2017-09-27 MED ORDER — INSULIN ASPART 100 UNIT/ML ~~LOC~~ SOLN
0.0000 [IU] | SUBCUTANEOUS | Status: DC
  Administered 2017-09-27: 7 [IU] via SUBCUTANEOUS
  Administered 2017-09-27: 15 [IU] via SUBCUTANEOUS
  Administered 2017-09-27: 4 [IU] via SUBCUTANEOUS
  Administered 2017-09-28: 7 [IU] via SUBCUTANEOUS
  Administered 2017-09-28: 11 [IU] via SUBCUTANEOUS
  Administered 2017-09-28 (×2): 7 [IU] via SUBCUTANEOUS
  Administered 2017-09-28: 4 [IU] via SUBCUTANEOUS
  Administered 2017-09-28 (×2): 7 [IU] via SUBCUTANEOUS
  Administered 2017-09-29 – 2017-09-30 (×2): 3 [IU] via SUBCUTANEOUS

## 2017-09-27 MED ORDER — MAGNESIUM SULFATE 2 GM/50ML IV SOLN
2.0000 g | Freq: Once | INTRAVENOUS | Status: AC
  Administered 2017-09-27: 2 g via INTRAVENOUS
  Filled 2017-09-27: qty 50

## 2017-09-27 MED ORDER — DOCUSATE SODIUM 50 MG/5ML PO LIQD
100.0000 mg | Freq: Two times a day (BID) | ORAL | Status: DC
  Administered 2017-09-27 – 2017-09-28 (×3): 100 mg
  Filled 2017-09-27 (×3): qty 10

## 2017-09-27 MED ORDER — ALBUMIN HUMAN 25 % IV SOLN
25.0000 g | Freq: Once | INTRAVENOUS | Status: AC
  Administered 2017-09-27: 25 g via INTRAVENOUS
  Filled 2017-09-27: qty 50

## 2017-09-27 NOTE — Progress Notes (Addendum)
Admit: 09/15/2017 LOS: 12  77M with anuric AKI (BL SCr 1.6 - 1.7) on CRRT  Subjective:  On iHD now, tachy, UF goal 2L  No sig inc in UOP AM labs reviewed Intubated still, 30% FiO2 on PRVC  02/19 0701 - 02/20 0700 In: 3179.3 [I.V.:1419.3; NG/GT:960; IV Piggyback:800] Out: 130 [Urine:130]  Filed Weights   09/26/17 1821 09/27/17 0500 09/27/17 0709  Weight: 80.7 kg (177 lb 14.6 oz) 82.3 kg (181 lb 7 oz) 82.3 kg (181 lb 7 oz)    Scheduled Meds: . atorvastatin  80 mg Per Tube Daily  . chlorhexidine gluconate (MEDLINE KIT)  15 mL Mouth Rinse BID  . darbepoetin (ARANESP) injection - DIALYSIS  60 mcg Intravenous Q Tue-HD  . dextrose  0.5 ampule Intravenous Once  . feeding supplement (PRO-STAT SUGAR FREE 64)  30 mL Per Tube BID  . hydrocerin   Topical BID  . hydrocortisone sod succinate (SOLU-CORTEF) inj  50 mg Intravenous Q12H  . insulin aspart  0-15 Units Subcutaneous Q4H  . insulin glargine  10 Units Subcutaneous QHS  . ipratropium-albuterol  3 mL Nebulization TID  . mouth rinse  15 mL Mouth Rinse 10 times per day  . pantoprazole sodium  40 mg Per Tube Daily  . sodium chloride flush  3 mL Intravenous Q12H   Continuous Infusions: . sodium chloride 250 mL (09/27/17 0800)  . amiodarone Stopped (09/23/17 1159)  . amiodarone 60 mg/hr (09/27/17 0800)  . ceFEPime (MAXIPIME) IV Stopped (09/26/17 1058)  . feeding supplement (VITAL HIGH PROTEIN) 1,000 mL (09/27/17 0800)  . heparin 1,300 Units/hr (09/27/17 2707)  . phenylephrine (NEO-SYNEPHRINE) Adult infusion Stopped (09/24/17 0100)   PRN Meds:.sodium chloride, acetaminophen, camphor-menthol, fentaNYL (SUBLIMAZE) injection, fentaNYL (SUBLIMAZE) injection, heparin, heparin, midazolam, midazolam, midazolam, nitroGLYCERIN, ondansetron (ZOFRAN) IV, polyvinyl alcohol, senna-docusate, sodium chloride flush  Current Labs: reviewed  Results for LINLEY, MOXLEY (MRN [id number]) as of 09/27/2017 08:49  Ref. Range 09/26/2017 09:45  Saturation  Ratios Latest Ref Range: 17.9 - 39.5 % 52 (H)  Ferritin Latest Ref Range: 24 - 336 ng/mL 816 (H)    Physical Exam:  Blood pressure 110/67, pulse (!) 129, temperature 98.9 F (37.2 C), resp. rate 11, height 5' (1.524 m), weight 82.3 kg (181 lb 7 oz), SpO2 100 %. 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 Dialysis dependent AoCKD3 off CRRT 09/25/17 2. AFib with RVR; bradycardia was with temp pacer; likely to need PPM when improved 3. Influenza A with VDRF 4. Septic Shock improving off pressors 5. ASCVD hx/o MI, PAD and CVA (hx/o CEA and CABG) 6. Anemia, not Fe deficient 7. DM2  P 1. iHD today: Goal UF 2L, albumin 25gm IV x3 prn low BP 2. Hold on pursuing TDC with WBC up today; is afebrile off ABX as of 2/19 3. ESA today   [name] MD 09/27/2017, 8:49 AM  Recent Labs  Lab 09/26/17 0322 09/26/17 1549 09/27/17 0328  NA 140 139 139  K 3.7 3.4* 3.7  CL 105 104 104  CO2 '22 22 23  ' GLUCOSE 205* 291* 345*  BUN 36* 52* 65*  CREATININE 2.30* 2.67* 3.06*  CALCIUM 8.7* 8.6* 8.7*  PHOS 3.1  3.0 2.6  2.7 2.2*   Recent Labs  Lab 09/25/17 0314 09/26/17 0322 09/27/17 0328  WBC 7.3 10.1 21.5*  HGB 7.9* 7.9* 8.5*  HCT  25.5* 25.0* 27.2*  MCV 84.7 85.0 85.8  PLT 207 216 217

## 2017-09-27 NOTE — Progress Notes (Signed)
CRITICAL VALUE ALERT  Critical Value:  Ph 1.4  Date & Time Notied:  09/27/17 1802  Provider Notified: Yes  Orders Received/Actions taken: Awaiting call back

## 2017-09-27 NOTE — Progress Notes (Signed)
PULMONARY / CRITICAL CARE MEDICINE   Name: [name] MRN: [id number] DOB: Nov 10, 1955    ADMISSION DATE:  09/15/2017 CONSULTATION DATE: 09/18/2017  REFERRING MD: Neva Seat  Reason for consult: hypotension and worsening aA gradient  HISTORY OF PRESENT ILLNESS:        This is a 62 year old diabetic with a history of coronary disease and congestive heart failure with a baseline ejection fraction of 40-50%.  He was admitted on 2/8 complaining of generalized weakness and was found to be in atrial fibrillation with a rapid ventricular response with intermittent 7-8-second pauses.  A temporary pacing wire was placed.  He was noted to have infiltrates or CHF on his chest x-ray.  PCR for influenza a was positive.  He was started on a combination of Tamiflu as well as clindamycin and aztreonam due to a penicillin allergy.  He was diuresed but diuretics are currently being held as his creatinine has risen from approximately 0.9-2.5.  I was called today for hypotension and increasing oxygen requirements.  On my arrival the patient's mental status is very poor.  He is extremely lethargic and not giving any significant history.  He has had a nonproductive cough.  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  SUBJECTIVE:     No sig change.  Getting HD now.  1.5L off so far with goal 2.5 but likely wont get that much r/t BP.  Weaned yesterday 12pm-6pm. Not weaning yet this am r/t HD.   VITAL SIGNS: BP (!) 111/56   Pulse (!) 120   Temp 98.9 F (37.2 C)   Resp 11   Ht 5' (1.524 m)   Wt 82.3 kg (181 lb 7 oz)   SpO2 100%   BMI 35.43 kg/m   HEMODYNAMICS: CVP:  [8 mmHg] 8 mmHg  VENTILATOR SETTINGS: Vent Mode: PRVC FiO2 (%):  [30 %-40 %] 30 % Set Rate:  [20 bmp] 20 bmp Vt Set:  [400 mL] 400 mL PEEP:  [5 cmH20] 5 cmH20 Pressure Support:  [5 cmH20-8 cmH20] 8 cmH20 Plateau Pressure:  [13 cmH20] 13 cmH20  INTAKE /  OUTPUT: I/O last 3 completed shifts: In: 4839.7 [I.V.:2284.7; NG/GT:1455; IV Piggyback:1100] Out: 150 [Urine:150]  PHYSICAL EXAMINATION:   Gen:     Chronically ill appearing male, No acute distress getting HD HEENT:  EOMI, sclera anicteric, ETT Neck:     No masses; no thyromegaly Lungs:   resps even non labored, diminished bases otherwise essentially clear CV:         Regular rate and rhythm; no murmurs Abd:     abd large, soft, + bowel sounds; soft, non-tender; no palpable masses, no distension, no BM  Ext:    No edema; adequate peripheral perfusion Skin:      Warm and dry; no rash Neuro:  Awake, follows some commands with much prompting.   LABS:  BMET Recent Labs  Lab 09/26/17 0322 09/26/17 1549 09/27/17 0328  NA 140 139 139  K 3.7 3.4* 3.7  CL 105 104 104  CO2 22 22 23   BUN 36* 52* 65*  CREATININE 2.30* 2.67* 3.06*  GLUCOSE 205* 291* 345*    Electrolytes Recent Labs  Lab 09/26/17 0322 09/26/17 1549 09/27/17 0328  CALCIUM 8.7* 8.6* 8.7*  MG 2.4 2.3 2.4  PHOS 3.1  3.0 2.6  2.7 2.2*    CBC Recent Labs  Lab 09/25/17 0314 09/26/17 0322 09/27/17 0328  WBC  7.3 10.1 21.5*  HGB 7.9* 7.9* 8.5*  HCT 25.5* 25.0* 27.2*  PLT 207 216 217    Coag's No results for input(s): APTT, INR in the last 168 hours.  Sepsis Markers Recent Labs  Lab 09/26/17 0945 09/27/17 0328  PROCALCITON 0.50 0.49    ABG Recent Labs  Lab 09/22/17 0355 09/24/17 0322 09/25/17 1633  PHART 7.368 7.412 7.405  PCO2ART 38.7 38.0 33.4  PO2ART 90.8 107 105.0    Liver Enzymes Recent Labs  Lab 09/22/17 0350  09/24/17 0402  09/26/17 0322 09/26/17 1549 09/27/17 0328  AST 48*  --  22  --   --   --   --   ALT 15*  --  12*  --   --   --   --   ALKPHOS 60  --  47  --   --   --   --   BILITOT 0.5  --  0.8  --   --   --   --   ALBUMIN 1.6*   < > 1.8*  1.8*   < > 2.0* 2.0* 2.1*   < > = values in this interval not displayed.    Cardiac Enzymes No results for input(s): TROPONINI,  PROBNP in the last 168 hours.  Glucose Recent Labs  Lab 09/26/17 0739 09/26/17 1259 09/26/17 1552 09/26/17 1814 09/26/17 1938 09/26/17 2332  GLUCAP 202* 261* 273* 248* 240* 237*    Imaging Dg Chest Port 1 View  Result Date: 09/27/2017 CLINICAL DATA:  Respiratory failure. Cough and chills and short of breath EXAM: PORTABLE CHEST 1 VIEW COMPARISON:  09/26/2017 FINDINGS: Endotracheal tube in good position. Bilateral jugular central venous catheter tips in the SVC unchanged. NG in the stomach Partial clearing of bibasilar airspace disease. No significant pleural effusion. Mild vascular congestion. IMPRESSION: Support lines remain in good position Improved aeration in the lung bases. Electronically Signed   By: Franchot Gallo M.D.   On: 09/27/2017 07:52    ANTIBIOTICS: Tamiflu 2/8 > 2/12  Cefepime 2/12 >  Linezolid 2/12 >  Clinda 2/11 > 2/12 2/11 azactam>> 2/12  LINES/TUBES: ETT 2/12 >  L brachial art line 2/12 > out  L IJ CVL 2/12 >   DISCUSSION:  This is a 62 year old diabetic with a history of LV dysfunction secondary to coronary artery disease as well as paroxysmal atrial fibrillation who presented with weakness, A. fib with rapid ventricular response, and intermittent pauses.  He has been found to be influenza A positive.  He has been placed on amiodarone and a temporary pacing wire has been placed. Respiratory status deteriorated 2/12; therefore, he required intubation.  ASSESSMENT / PLAN:  Acute hypoxemic respiratory failure with bilateral pulmonary infiltrates Flu A, bacterial pneumonia. Plan: Vent support - 8cc/kg  F/u CXR  F/u ABG PS wean as tol  Volume removal with HD  S/p full course zyvox, cefepime - stopped 2/19 D8 vent - if no significant weaning over next few days need to discuss trach   Septic, cardiogenic shock Intermittent atrial fibrillation with Tachy/Brady syndrome s/p Temp pacer in place. Plans for PPM but currently too ill.  Pt is reportedly on  fludrocortisone 0.1mg  at home Plan: Temp pacer pulled 2/18, cards planning PPM once over acute issues Continue amiodarone - gtt remains @60  - ?wean to 30 - will hold for now given tachycardia overnight - defer to cards  heparin per cards Reduce stress dose steroids - consider transition back to home florinef in am 2/21  AKI - oliguric AGMA - ?uremia Hypomagnesemia>> resolved Renal following, intermittent HD  Needs tunneled cath placement - holding with leukocytosis  Replete electrolytes as needed Monitor BMET  Anemia - chronic Follow CBC  DM SSI, Lantus - increase lantus to 15units daily  Check hgba1c in am - ?how well controlled he was at home   Acute metabolic encephalopathy -improved.   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 Will need ENT consult once improved.   No family available 2/20.      Nickolas Madrid, NP 09/27/2017  9:54 AM Pager: [phone] or [phone]

## 2017-09-27 NOTE — Progress Notes (Signed)
ANTICOAGULATION CONSULT NOTE - Follow Up Consult  Pharmacy Consult for Heparin Indication: atrial fibrillation  Allergies  Allergen Reactions  . Penicillins Hives, Nausea And Vomiting, Swelling and Other (See Comments)    Tolerated Cefepime Has patient had a PCN reaction causing immediate rash, facial/tongue/throat swelling, SOB or lightheadedness with hypotension: YES Has patient had a PCN reaction causing severe rash involving mucus membranes or skin necrosis: No Has patient had a PCN reaction that required hospitalization No Has patient had a PCN reaction occurring within the last 10 years: No If all of the above answers are "NO", then may proceed with Cephalosporin use.     Patient Measurements: Height: 5' (152.4 cm) Weight: 181 lb 7 oz (82.3 kg) IBW/kg (Calculated) : 50  Vital Signs: Temp: 98.9 F (37.2 C) (02/20 0810) Temp Source: Oral (02/20 0700) BP: 82/59 (02/20 0900) Pulse Rate: 135 (02/20 0900)  Labs: Recent Labs    09/25/17 0314  09/26/17 0322 09/26/17 1549 09/27/17 0328  HGB 7.9*  --  7.9*  --  8.5*  HCT 25.5*  --  25.0*  --  27.2*  PLT 207  --  216  --  217  HEPARINUNFRC 0.48  --  0.56  --  0.51  CREATININE 1.53*  1.51*   < > 2.30* 2.67* 3.06*   < > = values in this interval not displayed.    Estimated Creatinine Clearance: 22 mL/min (A) (by C-G formula based on SCr of 3.06 mg/dL (H)).   Assessment:  62 year old male on IV heparin for atrial fibrillation.  Heparin level continues to be therapeutic at 0.51 on 1300 units/hr. Hgb is stable at 8.5 and platelets are stable at 217. No bleeding or issues with infusion are noted.   Goal of Therapy:  Heparin level 0.3-0.7 units/ml Monitor platelets by anticoagulation protocol: Yes   Plan:  Continue heparin at 1300 units/hr Daily heparin level and CBC  Sloan Leiter, PharmD, BCPS, BCCCP Clinical Pharmacist Clinical phone 09/27/2017 until 3:30PM - #[phone] After hours, please call #[phone] 09/27/2017  9:21 AM

## 2017-09-27 NOTE — Progress Notes (Signed)
Inpatient Diabetes Program Recommendations  AACE/ADA: New Consensus Statement on Inpatient Glycemic Control (2015)  Target Ranges:  Prepandial:   less than 140 mg/dL      Peak postprandial:   less than 180 mg/dL (1-2 hours)      Critically ill patients:  140 - 180 mg/dL   Lab Results  Component Value Date   GLUCAP 237 (H) 09/26/2017   HGBA1C 5.8 (H) 08/11/2015    Review of Glycemic Control Results for KEJUAN, BEKKER (MRN [id number]) as of 09/27/2017 10:34  Ref. Range 09/26/2017 15:52 09/26/2017 18:14 09/26/2017 19:38 09/26/2017 23:32  Glucose-Capillary Latest Ref Range: 65 - 99 mg/dL 273 (H) 248 (H) 240 (H) 237 (H)   Diabetes history: Type 2 DM Outpatient Diabetes medications: Lantus 10 Units QHS, Metformin 1,000 mg BID Current orders for Inpatient glycemic control: Lantus 15 Units QHS, Novolog 0-20 Units Q4H, Solucortef 50 mg Q12H  Inpatient Diabetes Program Recommendations:    Noted placement of A1C, pending.   Noted Solucortef 50 mg Q12H, so expect increases to BS. Would recommend Novolog 5 Units Q4H for tube feed coverage. May need to adjust insulin requirements as steroids are tapered.   Thanks, Bronson Curb, MSN, RNC-OB Diabetes Coordinator [phone] (8a-5p)

## 2017-09-28 DIAGNOSIS — N179 Acute kidney failure, unspecified: Secondary | ICD-10-CM

## 2017-09-28 LAB — HEMOGLOBIN A1C
Hgb A1c MFr Bld: 7.9 % — ABNORMAL HIGH (ref 4.8–5.6)
Mean Plasma Glucose: 180.03 mg/dL

## 2017-09-28 LAB — PROCALCITONIN: Procalcitonin: 0.75 ng/mL

## 2017-09-28 LAB — CBC
HEMATOCRIT: 25.2 % — AB (ref 39.0–52.0)
HEMOGLOBIN: 7.7 g/dL — AB (ref 13.0–17.0)
MCH: 25.8 pg — ABNORMAL LOW (ref 26.0–34.0)
MCHC: 30.6 g/dL (ref 30.0–36.0)
MCV: 84.3 fL (ref 78.0–100.0)
Platelets: 141 10*3/uL — ABNORMAL LOW (ref 150–400)
RBC: 2.99 MIL/uL — AB (ref 4.22–5.81)
RDW: 16 % — ABNORMAL HIGH (ref 11.5–15.5)
WBC: 14.6 10*3/uL — AB (ref 4.0–10.5)

## 2017-09-28 LAB — BASIC METABOLIC PANEL
ANION GAP: 14 (ref 5–15)
BUN: 62 mg/dL — ABNORMAL HIGH (ref 6–20)
CHLORIDE: 97 mmol/L — AB (ref 101–111)
CO2: 24 mmol/L (ref 22–32)
CREATININE: 2.7 mg/dL — AB (ref 0.61–1.24)
Calcium: 8.1 mg/dL — ABNORMAL LOW (ref 8.9–10.3)
GFR calc non Af Amer: 24 mL/min — ABNORMAL LOW (ref >60)
GFR, EST AFRICAN AMERICAN: 27 mL/min — AB (ref >60)
Glucose, Bld: 254 mg/dL — ABNORMAL HIGH (ref 65–99)
Potassium: 3.8 mmol/L (ref 3.5–5.1)
Sodium: 135 mmol/L (ref 135–145)

## 2017-09-28 LAB — GLUCOSE, CAPILLARY
GLUCOSE-CAPILLARY: 268 mg/dL — AB (ref 65–99)
GLUCOSE-CAPILLARY: 218 mg/dL — AB (ref 65–99)
GLUCOSE-CAPILLARY: 205 mg/dL — AB (ref 65–99)
Glucose-Capillary: 226 mg/dL — ABNORMAL HIGH (ref 65–99)
Glucose-Capillary: 243 mg/dL — ABNORMAL HIGH (ref 65–99)
Glucose-Capillary: 164 mg/dL — ABNORMAL HIGH (ref 65–99)

## 2017-09-28 LAB — HEPATITIS B CORE ANTIBODY, TOTAL: Hep B Core Total Ab: NEGATIVE

## 2017-09-28 LAB — HEPATITIS B SURFACE ANTIGEN: Hepatitis B Surface Ag: NEGATIVE

## 2017-09-28 LAB — HEPATITIS B SURFACE ANTIBODY,QUALITATIVE: Hep B S Ab: NONREACTIVE

## 2017-09-28 LAB — HEPARIN LEVEL (UNFRACTIONATED): HEPARIN UNFRACTIONATED: 0.29 [IU]/mL — AB (ref 0.30–0.70)

## 2017-09-28 MED ORDER — HYDROCORTISONE NA SUCCINATE PF 100 MG IJ SOLR
50.0000 mg | Freq: Every day | INTRAMUSCULAR | Status: DC
  Administered 2017-09-29: 50 mg via INTRAVENOUS
  Filled 2017-09-28: qty 2

## 2017-09-28 MED ORDER — ORAL CARE MOUTH RINSE
15.0000 mL | Freq: Two times a day (BID) | OROMUCOSAL | Status: DC
  Administered 2017-09-28 – 2017-10-10 (×16): 15 mL via OROMUCOSAL

## 2017-09-28 MED ORDER — SODIUM CHLORIDE 0.9 % IV SOLN
INTRAVENOUS | Status: DC
Start: 2017-09-28 — End: 2017-10-09
  Administered 2017-09-29 – 2017-10-02 (×2): via INTRAVENOUS

## 2017-09-28 NOTE — Procedures (Signed)
Extubation Procedure Note  Patient Details:   Name: [name] DOB: 1956-02-20 MRN: [id number]   Airway Documentation:  Airway 7.5 mm (Active)  Secured at (cm) 24 cm 09/28/2017 12:00 PM  Measured From Lips 09/28/2017 12:00 PM  [address] 09/28/2017 11:16 AM  Secured By [employer] 09/28/2017 12:00 PM  Tube Holder Repositioned Yes 09/28/2017 11:16 AM  Cuff Pressure (cm H2O) 28 cm H2O 09/28/2017  7:51 AM  Site Condition Dry 09/28/2017 11:16 AM   Positive cuff leak prior to extubation.   Evaluation  O2 sats: stable throughout Complications: No apparent complications Patient did tolerate procedure well. Bilateral Breath Sounds: Clear, Diminished   Yes  RN at bedside during extubation. No stridor or distress noted. Pt able to speak with good strong cough. Placed pt on 2L [place].    Elwin Mocha 09/28/2017, 3:10 PM

## 2017-09-28 NOTE — Care Management Important Message (Signed)
Important Message  Patient Details  Name: [name] MRN: [id number] Date of Birth: Jun 21, 1956   Medicare Important Message Given:  Yes    Orbie Pyo 09/28/2017, 12:21 PM

## 2017-09-28 NOTE — Progress Notes (Signed)
ANTICOAGULATION CONSULT NOTE - Follow Up Consult  Pharmacy Consult for Heparin Indication: atrial fibrillation  Allergies  Allergen Reactions  . Penicillins Hives, Nausea And Vomiting, Swelling and Other (See Comments)    Tolerated Cefepime Has patient had a PCN reaction causing immediate rash, facial/tongue/throat swelling, SOB or lightheadedness with hypotension: YES Has patient had a PCN reaction causing severe rash involving mucus membranes or skin necrosis: No Has patient had a PCN reaction that required hospitalization No Has patient had a PCN reaction occurring within the last 10 years: No If all of the above answers are "NO", then may proceed with Cephalosporin use.     Patient Measurements: Height: 5' (152.4 cm) Weight: 179 lb 7.3 oz (81.4 kg) IBW/kg (Calculated) : 50  Vital Signs: Temp: 98.9 F (37.2 C) (02/21 0343) Temp Source: Oral (02/21 0343) BP: 150/70 (02/21 0500) Pulse Rate: 76 (02/21 0500)  Labs: Recent Labs    09/26/17 0322 09/26/17 1549 09/27/17 0328 09/28/17 0454  HGB 7.9*  --  8.5*  --   HCT 25.0*  --  27.2*  --   PLT 216  --  217  --   HEPARINUNFRC 0.56  --  0.51 0.29*  CREATININE 2.30* 2.67* 3.06*  --     Estimated Creatinine Clearance: 21.9 mL/min (A) (by C-G formula based on SCr of 3.06 mg/dL (H)).   Assessment:  62 year old male on IV heparin for atrial fibrillation.  Heparin level fell slightly below goal to 0.29 on 1300 units/hr. Hgb is stable at 8.5 and platelets are stable at 217. No bleeding or issues with infusion are noted.   Goal of Therapy:  Heparin level 0.3-0.7 units/ml Monitor platelets by anticoagulation protocol: Yes   Plan:  Increase heparin to 1350 units/hr Daily heparin level and CBC  Sloan Leiter, PharmD, BCPS, BCCCP Clinical Pharmacist Clinical phone 09/28/2017 until 3:30PM - #[phone] After hours, please call #[phone] 09/28/2017 7:47 AM

## 2017-09-28 NOTE — Care Management Important Message (Signed)
Important Message  Patient Details  Name: [name] MRN: [id number] Date of Birth: June 30, 1956   Medicare Important Message Given:  Yes  Please disregard   Orbie Pyo 09/28/2017, 12:25 PM

## 2017-09-28 NOTE — Progress Notes (Signed)
Dr Gladys Damme called and notified of patient's cardiac rhythm returning to a-fib w/ rates in 110s-120s and as high as upper 130s. No new orders given at this time; will continue to monitor.

## 2017-09-28 NOTE — Progress Notes (Signed)
PULMONARY / CRITICAL CARE MEDICINE   Name: [name] MRN: [id number] DOB: April 05, 1956    ADMISSION DATE:  09/15/2017 CONSULTATION DATE: 09/18/2017  REFERRING MD: Neva Seat  Reason for consult: hypotension and worsening aA gradient  HISTORY OF PRESENT ILLNESS:        This is a 62 year old diabetic with a history of coronary disease and congestive heart failure with a baseline ejection fraction of 40-50%.  He was admitted on 2/8 complaining of generalized weakness and was found to be in atrial fibrillation with a rapid ventricular response with intermittent 7-8-second pauses.  A temporary pacing wire was placed , now removed.  He was noted to have infiltrates or CHF on his chest x-ray.  PCR for influenza a was positive.  He was started on a combination of Tamiflu as well as clindamycin and aztreonam due to a penicillin allergy.  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 He developed AKI requiring transient CRRT  ANTIBIOTICS: Tamiflu 2/8 > 2/12  Cefepime 2/12 >  Linezolid 2/12 >  Clinda 2/11 > 2/12 2/11 azactam>> 2/12  LINES/TUBES: ETT 2/12 >  L brachial art line 2/12 > out  L IJ CVL 2/12 >  RIJ HD cath >>   SUBJECTIVE:     Afebrile Awake , interactive, calm , weaning on PS 5/5  VITAL SIGNS: BP 137/60   Pulse 76   Temp 98.8 F (37.1 C) (Oral)   Resp 20   Ht 5' (1.524 m)   Wt 179 lb 7.3 oz (81.4 kg)   SpO2 100%   BMI 35.05 kg/m   HEMODYNAMICS:    VENTILATOR SETTINGS: Vent Mode: PSV;CPAP FiO2 (%):  [30 %] 30 % Set Rate:  [20 bmp] 20 bmp Vt Set:  [400 mL] 400 mL PEEP:  [5 cmH20] 5 cmH20 Pressure Support:  [5 cmH20-10 cmH20] 5 cmH20 Plateau Pressure:  [10 cmH20-17 cmH20] 17 cmH20  INTAKE / OUTPUT: I/O last 3 completed shifts: In: 4359.8 [I.V.:2224.8; GY/IR:4854; IV [id number]:381] Out: 8299 [Urine:145; Other:1634]  PHYSICAL EXAMINATION:   Gen:     Chronically ill appearing male, No acute distress  HEENT:  EOMI, sclera  anicteric, ETT Neck:     No masses; no thyromegaly Lungs:   resps even non labored, diminished bases otherwise essentially clear CV:         Regular rate and rhythm; no murmurs Abd:     abd large, soft, + bowel sounds; soft, non-tender; no palpable masses, no distension, no BM  Ext:    No edema; adequate peripheral perfusion Skin:      Warm and dry; no rash Neuro:  Awake, follows commands , non focal  LABS:  BMET Recent Labs  Lab 09/26/17 0322 09/26/17 1549 09/27/17 0328  NA 140 139 139  K 3.7 3.4* 3.7  CL 105 104 104  CO2 22 22 23   BUN 36* 52* 65*  CREATININE 2.30* 2.67* 3.06*  GLUCOSE 205* 291* 345*    Electrolytes Recent Labs  Lab 09/26/17 0322 09/26/17 1549 09/27/17 0328 09/27/17 1700  CALCIUM 8.7* 8.6* 8.7*  --   MG 2.4 2.3 2.4 1.8  PHOS 3.1  3.0 2.6  2.7 2.2* 1.4*    CBC Recent Labs  Lab 09/25/17 0314 09/26/17 0322 09/27/17 0328  WBC 7.3 10.1 21.5*  HGB 7.9* 7.9* 8.5*  HCT 25.5* 25.0* 27.2*  PLT 207 216 217    Coag's No results for input(s): APTT, INR in the last 168 hours.  Sepsis Markers Recent  Labs  Lab 09/26/17 0945 09/27/17 0328 09/28/17 0454  PROCALCITON 0.50 0.49 0.75    ABG Recent Labs  Lab 09/22/17 0355 09/24/17 0322 09/25/17 1633  PHART 7.368 7.412 7.405  PCO2ART 38.7 38.0 33.4  PO2ART 90.8 107 105.0    Liver Enzymes Recent Labs  Lab 09/22/17 0350  09/24/17 0402  09/26/17 0322 09/26/17 1549 09/27/17 0328  AST 48*  --  22  --   --   --   --   ALT 15*  --  12*  --   --   --   --   ALKPHOS 60  --  47  --   --   --   --   BILITOT 0.5  --  0.8  --   --   --   --   ALBUMIN 1.6*   < > 1.8*  1.8*   < > 2.0* 2.0* 2.1*   < > = values in this interval not displayed.    Cardiac Enzymes No results for input(s): TROPONINI, PROBNP in the last 168 hours.  Glucose Recent Labs  Lab 09/27/17 1508 09/27/17 1922 09/27/17 2311 09/28/17 0342 09/28/17 0752 09/28/17 1144  GLUCAP 245* 309* 235* 268* 218* 226*     Imaging No results found.    DISCUSSION: Weaning well , off CRRT   ASSESSMENT / PLAN:  Acute hypoxemic respiratory failure with bilateral pulmonary infiltrates Flu A, bacterial pneumonia. Plan: Extubate Volume removal with HD  S/p full course zyvox, cefepime - stopped 2/19   Septic, cardiogenic shock Intermittent atrial fibrillation with Tachy/Brady syndrome   Plan: Temp pacer pulled 2/18, cards planning PPM once over acute issues Continue amiodarone -  defer to cards  heparin per cards   Adrenal insuff/ - Reduce solucortef 50 q24 - consider transition back to home florinef when able to take PO  AKI - oliguric AGMA - ?uremia Hypomagnesemia>> resolved Renal following, intermittent HD  Needs tunneled cath placement - holding with leukocytosis  Replete electrolytes as needed Monitor BMET  Anemia - chronic Follow CBC  DM SSI, Lantus - increased lantus to 15units daily on 2/20   Acute metabolic encephalopathy -improved.    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 Will need ENT consult once improved.   The patient is critically ill with multiple organ systems failure and requires high complexity decision making for assessment and support, frequent evaluation and titration of therapies, application of advanced monitoring technologies and extensive interpretation of multiple databases. Critical Care Time devoted to patient care services described in this note independent of APP/resident  time is 35 minutes.    Kara Mead MD. [place] Flood. [facility] Pager [phone] If no response call [phone]     09/28/2017  3:00 PM

## 2017-09-28 NOTE — Progress Notes (Addendum)
Progress Note  Patient Name: [name] Date of Encounter: 09/28/2017  Primary Cardiologist: Mertie Moores, MD   Subjective   Pt was extubated about 15 minutes ago. He is answering questions intermittently, but not completely oriented. Complains of chest pain and palpitations, but falls asleep when I ask additional questions.  Inpatient Medications    Scheduled Meds: . atorvastatin  80 mg Per Tube Daily  . chlorhexidine gluconate (MEDLINE KIT)  15 mL Mouth Rinse BID  . darbepoetin (ARANESP) injection - DIALYSIS  60 mcg Intravenous Q Tue-HD  . dextrose  0.5 ampule Intravenous Once  . docusate  100 mg Per Tube BID  . hydrocerin   Topical BID  . [START ON 09/29/2017] hydrocortisone sod succinate (SOLU-CORTEF) inj  50 mg Intravenous Daily  . insulin aspart  0-20 Units Subcutaneous Q4H  . insulin glargine  15 Units Subcutaneous QHS  . ipratropium-albuterol  3 mL Nebulization TID  . mouth rinse  15 mL Mouth Rinse 10 times per day  . pantoprazole sodium  40 mg Per Tube Daily  . senna  1 tablet Oral BID  . senna-docusate  1 tablet Oral QHS  . sodium chloride flush  3 mL Intravenous Q12H   Continuous Infusions: . amiodarone 60 mg/hr (09/28/17 1249)  . heparin 1,350 Units/hr (09/28/17 0757)   PRN Meds: acetaminophen, bisacodyl, camphor-menthol, heparin, nitroGLYCERIN, ondansetron (ZOFRAN) IV, polyvinyl alcohol, sodium chloride flush   Vital Signs    Vitals:   09/28/17 1300 09/28/17 1400 09/28/17 1445 09/28/17 1500  BP: (!) 146/66 137/60  140/67  Pulse: 81 76  79  Resp: (!) _0 Temp:      TempSrc:      SpO2: 100% 100% 100% 100%  Weight:      Height:        Intake/Output Summary (Last 24 hours) at 09/28/2017 1520 Last data filed at 09/28/2017 1400 Gross per 24 hour  Intake 2549.23 ml  Output 90 ml  Net 2459.23 ml   Filed Weights   09/27/17 0709 09/27/17 1117 09/28/17 0456  Weight: 181 lb 7 oz (82.3 kg) 177 lb 7.5 oz (80.5 kg) 179 lb 7.3 oz (81.4 kg)     Telemetry    NSR, last bout of Afib in the 120s was 07/27/18 - Personally Reviewed  ECG    No new tracings - Personally Reviewed  Physical Exam   GEN: sleepy    Neck: CVC Cardiac: exam difficult with respiratory sounds, RRR  Respiratory: Coarse throughout GI: Soft, nontender, non-distended  MS: Mild edema, pulses very faint, partial right foot amputation Neuro:  orientation questions difficult to assess, pt sleepy Psych: Normal affect   Labs    Chemistry Recent Labs  Lab 09/22/17 0350  09/24/17 0402  09/26/17 0322 09/26/17 1549 09/27/17 0328  NA 133*   < > 139  140   < > 140 139 139  K 4.0   < > 3.7  3.7   < > 3.7 3.4* 3.7  CL 100*   < > 105  106   < > 105 104 104  CO2 19*   < > 21*  21*   < > _1 GLUCOSE 159*   < > 137*  137*   < > 205* 291* 345*  BUN 43*   < > 21*  21*   < > 36* 52* 65*  CREATININE 2.80*   < > 1.68*  1.58*   < > 2.30* 2.67* 3.06*  CALCIUM 7.8*   < > 8.2*  8.2*   < > 8.7* 8.6* 8.7*  PROT 6.2*  --  5.9*  --   --   --   --   ALBUMIN 1.6*   < > 1.8*  1.8*   < > 2.0* 2.0* 2.1*  AST 48*  --  22  --   --   --   --   ALT 15*  --  12*  --   --   --   --   ALKPHOS 60  --  47  --   --   --   --   BILITOT 0.5  --  0.8  --   --   --   --   GFRNONAA 22*   < > 42*  45*   < > 29* 24* 20*  GFRAA 26*   < > 48*  52*   < > 33* 28* 23*  ANIONGAP 14   < > 13  13   < > _0 < > = values in this interval not displayed.     Hematology Recent Labs  Lab 09/25/17 0314 09/26/17 0322 09/27/17 0328  WBC 7.3 10.1 21.5*  RBC 3.01* 2.94* 3.17*  HGB 7.9* 7.9* 8.5*  HCT 25.5* 25.0* 27.2*  MCV 84.7 85.0 85.8  MCH 26.2 26.9 26.8  MCHC 31.0 31.6 31.3  RDW 15.9* 16.3* 16.3*  PLT 207 216 217    Cardiac EnzymesNo results for input(s): TROPONINI in the last 168 hours. No results for input(s): TROPIPOC in the last 168 hours.   BNPNo results for input(s): BNP, PROBNP in the last 168 hours.   DDimer No results for input(s): DDIMER in the last  168 hours.   Radiology    Dg Chest Port 1 View  Result Date: 09/27/2017 CLINICAL DATA:  Respiratory failure. Cough and chills and short of breath EXAM: PORTABLE CHEST 1 VIEW COMPARISON:  09/26/2017 FINDINGS: Endotracheal tube in good position. Bilateral jugular central venous catheter tips in the SVC unchanged. NG in the stomach Partial clearing of bibasilar airspace disease. No significant pleural effusion. Mild vascular congestion. IMPRESSION: Support lines remain in good position Improved aeration in the lung bases. Electronically Signed   By: Franchot Gallo M.D.   On: 09/27/2017 07:52    Cardiac Studies   Echo 09/18/17: Study Conclusions - Left ventricle: The cavity size was normal. Wall thickness was   increased in a pattern of mild LVH. Indeterminant diastolic   function, atrial fibrillation. Systolic function was mildly to   moderately reduced. The estimated ejection fraction was in the   range of 40% to 45%. Diffuse hypokinesis. - Aortic valve: There was no stenosis. - Mitral valve: Moderately calcified annulus. There was no   significant regurgitation. - Left atrium: The atrium was mildly dilated. - Right ventricle: The cavity size was mildly to moderately   dilated. Pacer wire or catheter noted in right ventricle.   Systolic function was mildly reduced. - Right atrium: The atrium was mildly dilated. - Tricuspid valve: Peak RV-RA gradient (S): 32 mm Hg. - Pulmonary arteries: PA peak pressure: 35 mm Hg (S). - Inferior vena cava: The vessel was normal in size. The   respirophasic diameter changes were in the normal range (>= 50%),   consistent with normal central venous pressure.  Impressions: - The patient was in atrial fibrillation. Normal LV size with mild   LV hypertrophy. EF 40-45%. Mild-moderate RV dilation with mildly  decreased systolic function. Borderline pulmonary hypertension.   Patient Profile     62 y.o. male admitted with influenza and respiratory  failure and atrial fib with a RVR and slow VR and pauses, s/p temporary PM.  Assessment & Plan    1. Atrial fibrillation, tachy brady syndrome EP previously following for placement of a temporary pacer on 09/15/17 while waiting to place a PPM.  He was last seen by EP on 09/25/17. At that time, he was going in and out of NSR with minimal pauses. Temp pacer was removed 2/18 by EP. Amiodarone drip still running. Telemetry with NSR since 07/27/18 at approximately 1800. Prior bouts of Afib yesterday appear to be in the 120s. Given his respiratory status and improving clinical picture, will D/C amiodarone and monitor. If he returns to Afib RVR, can re-bolus with IV amiodarone. Continue heparin drip. Beta blocker previously avoided for bradycardia.    For questions or updates, please contact [place] Please consult [email or web address] for contact info under Cardiology/STEMI.      Signed, [place], [place]  09/28/2017, 3:20 PM    Attending Note:   The patient was seen and examined.  Agree with assessment and plan as noted above.  Changes made to the above note as needed.  Patient seen and independently examined with Doreene Adas, PA .   We discussed all aspects of the encounter. I agree with the assessment and plan as stated above.  1.  Sick sinus syndrome: The patient had an episode of rapid atrial fibrillation that was likely caused by his viral pneumonia.  He then had some bradycardia requiring temporary pacing. Plan is eventually to place a permanent pacemaker.  The temporary pacer has been removed.  We will discontinue the amiodarone.  If he develops recurrent atrial fibrillation, we can bolus him with IV amiodarone.  I suspect that most of his atrial fibrillation was due to his viral illness so the likelihood is less now that he is improving.   I have spent a total of 30 minutes with patient reviewing hospital  notes , telemetry, EKGs, labs and examining patient as well as establishing an  assessment and plan that was discussed with the patient. > 50% of time was spent in direct patient care.    Thayer Headings, Brooke Bonito., MD, [facility] 09/28/2017, 5:48 PM [address]  [place] Pager [phone]

## 2017-09-28 NOTE — Progress Notes (Signed)
Admit: 09/15/2017 LOS: 13  57M with anuric AKI (BL SCr 1.6 - 1.7) on CRRT  Subjective:  No new issues HD yesterday: 1.6L UF No sig UOP  02/20 0701 - 02/21 0700 In: 2801.2 [I.V.:1546.2; NG/GT:1205; IV Piggyback:50] Out: 1714 [Urine:80]  Filed Weights   09/27/17 0709 09/27/17 1117 09/28/17 0456  Weight: 82.3 kg (181 lb 7 oz) 80.5 kg (177 lb 7.5 oz) 81.4 kg (179 lb 7.3 oz)    Scheduled Meds: . atorvastatin  80 mg Per Tube Daily  . chlorhexidine gluconate (MEDLINE KIT)  15 mL Mouth Rinse BID  . darbepoetin (ARANESP) injection - DIALYSIS  60 mcg Intravenous Q Tue-HD  . dextrose  0.5 ampule Intravenous Once  . docusate  100 mg Per Tube BID  . feeding supplement (PRO-STAT SUGAR FREE 64)  30 mL Per Tube BID  . hydrocerin   Topical BID  . hydrocortisone sod succinate (SOLU-CORTEF) inj  50 mg Intravenous Q12H  . insulin aspart  0-20 Units Subcutaneous Q4H  . insulin glargine  15 Units Subcutaneous QHS  . ipratropium-albuterol  3 mL Nebulization TID  . mouth rinse  15 mL Mouth Rinse 10 times per day  . pantoprazole sodium  40 mg Per Tube Daily  . senna  1 tablet Oral BID  . senna-docusate  1 tablet Oral QHS  . sodium chloride flush  3 mL Intravenous Q12H   Continuous Infusions: . sodium chloride Stopped (09/27/17 1900)  . amiodarone 60 mg/hr (09/28/17 0628)  . feeding supplement (VITAL HIGH PROTEIN) 1,000 mL (09/27/17 1800)  . heparin 1,350 Units/hr (09/28/17 0757)   PRN Meds:.sodium chloride, acetaminophen, bisacodyl, camphor-menthol, fentaNYL (SUBLIMAZE) injection, fentaNYL (SUBLIMAZE) injection, heparin, midazolam, nitroGLYCERIN, ondansetron (ZOFRAN) IV, polyvinyl alcohol, sodium chloride flush  Current Labs: reviewed  Results for DERRICK, TIEGS (MRN [id number]) as of 09/27/2017 08:49  Ref. Range 09/26/2017 09:45  Saturation Ratios Latest Ref Range: 17.9 - 39.5 % 52 (H)  Ferritin Latest Ref Range: 24 - 336 ng/mL 816 (H)    Physical Exam:  Blood pressure (!) 141/55, pulse 74,  temperature 98.8 F (37.1 C), temperature source Oral, resp. rate (!) 22, height 5' (1.524 m), weight 81.4 kg (179 lb 7.3 oz), SpO2 100 %. 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 AFib with RVR; bradycardia was with temp pacer; likely to need PPM when improved 3. Influenza A with VDRF 4. Septic Shock improving off pressors 5. ASCVD hx/o MI, PAD and CVA (hx/o CEA and CABG) 6. Anemia, not Fe deficient; on ESA 7. DM2  P 1. iHD tomorrow: Goal UF 2L, albumin 25gm IV x3 prn low BP, tight heparin, start 3K 2. Hold on pursuing TDC with WBC up today; is afebrile off ABX as of 2/19   [name] MD 09/28/2017, 12:09 PM  Recent Labs  Lab 09/26/17 0322 09/26/17 1549 09/27/17 0328 09/27/17 1700  NA 140 139 139  --   K 3.7 3.4* 3.7  --   CL 105 104 104  --   CO2 _0 --   GLUCOSE 205* 291* 345*  --   BUN 36* 52* 65*  --   CREATININE 2.30* 2.67* 3.06*  --   CALCIUM 8.7* 8.6* 8.7*  --   PHOS 3.1  3.0 2.6  2.7 2.2* 1.4*  Recent Labs  Lab 09/25/17 0314 09/26/17 0322 09/27/17 0328  WBC 7.3 10.1 21.5*  HGB 7.9* 7.9* 8.5*  HCT 25.5* 25.0* 27.2*  MCV 84.7 85.0 85.8  PLT 207 216 217

## 2017-09-29 ENCOUNTER — Inpatient Hospital Stay (HOSPITAL_COMMUNITY): Payer: Medicare (Managed Care)

## 2017-09-29 LAB — BASIC METABOLIC PANEL
ANION GAP: 13 (ref 5–15)
BUN: 74 mg/dL — ABNORMAL HIGH (ref 6–20)
CALCIUM: 8.3 mg/dL — AB (ref 8.9–10.3)
CO2: 26 mmol/L (ref 22–32)
Chloride: 97 mmol/L — ABNORMAL LOW (ref 101–111)
Creatinine, Ser: 3.11 mg/dL — ABNORMAL HIGH (ref 0.61–1.24)
GFR, EST AFRICAN AMERICAN: 23 mL/min — AB (ref >60)
GFR, EST NON AFRICAN AMERICAN: 20 mL/min — AB (ref >60)
Glucose, Bld: 135 mg/dL — ABNORMAL HIGH (ref 65–99)
Potassium: 3.2 mmol/L — ABNORMAL LOW (ref 3.5–5.1)
Sodium: 136 mmol/L (ref 135–145)

## 2017-09-29 LAB — RENAL FUNCTION PANEL
ALBUMIN: 2.1 g/dL — AB (ref 3.5–5.0)
Anion gap: 13 (ref 5–15)
BUN: 75 mg/dL — ABNORMAL HIGH (ref 6–20)
CALCIUM: 8.2 mg/dL — AB (ref 8.9–10.3)
CO2: 24 mmol/L (ref 22–32)
Chloride: 96 mmol/L — ABNORMAL LOW (ref 101–111)
Creatinine, Ser: 3.26 mg/dL — ABNORMAL HIGH (ref 0.61–1.24)
GFR calc Af Amer: 22 mL/min — ABNORMAL LOW (ref >60)
GFR calc non Af Amer: 19 mL/min — ABNORMAL LOW (ref >60)
GLUCOSE: 134 mg/dL — AB (ref 65–99)
PHOSPHORUS: 2.5 mg/dL (ref 2.5–4.6)
POTASSIUM: 4.2 mmol/L (ref 3.5–5.1)
SODIUM: 133 mmol/L — AB (ref 135–145)

## 2017-09-29 LAB — CBC
HEMATOCRIT: 22.7 % — AB (ref 39.0–52.0)
Hemoglobin: 7.2 g/dL — ABNORMAL LOW (ref 13.0–17.0)
MCH: 26.6 pg (ref 26.0–34.0)
MCHC: 31.7 g/dL (ref 30.0–36.0)
MCV: 83.8 fL (ref 78.0–100.0)
Platelets: 120 10*3/uL — ABNORMAL LOW (ref 150–400)
RBC: 2.71 MIL/uL — ABNORMAL LOW (ref 4.22–5.81)
RDW: 15.8 % — AB (ref 11.5–15.5)
WBC: 12.4 10*3/uL — AB (ref 4.0–10.5)

## 2017-09-29 LAB — HEPARIN LEVEL (UNFRACTIONATED): HEPARIN UNFRACTIONATED: 0.37 [IU]/mL (ref 0.30–0.70)

## 2017-09-29 LAB — MAGNESIUM: Magnesium: 2.1 mg/dL (ref 1.7–2.4)

## 2017-09-29 LAB — GLUCOSE, CAPILLARY
GLUCOSE-CAPILLARY: 118 mg/dL — AB (ref 65–99)
Glucose-Capillary: 107 mg/dL — ABNORMAL HIGH (ref 65–99)
Glucose-Capillary: 109 mg/dL — ABNORMAL HIGH (ref 65–99)
Glucose-Capillary: 117 mg/dL — ABNORMAL HIGH (ref 65–99)
Glucose-Capillary: 130 mg/dL — ABNORMAL HIGH (ref 65–99)

## 2017-09-29 LAB — PHOSPHORUS: PHOSPHORUS: 2.6 mg/dL (ref 2.5–4.6)

## 2017-09-29 MED ORDER — ATORVASTATIN CALCIUM 80 MG PO TABS
80.0000 mg | ORAL_TABLET | Freq: Every day | ORAL | Status: DC
  Administered 2017-09-29 – 2017-10-26 (×27): 80 mg via ORAL
  Filled 2017-09-29 (×28): qty 1

## 2017-09-29 MED ORDER — FLUDROCORTISONE ACETATE 0.1 MG PO TABS
0.1000 mg | ORAL_TABLET | Freq: Every day | ORAL | Status: DC
  Administered 2017-09-29 – 2017-10-01 (×3): 0.1 mg via ORAL
  Filled 2017-09-29 (×6): qty 1

## 2017-09-29 MED ORDER — DOCUSATE SODIUM 100 MG PO CAPS
100.0000 mg | ORAL_CAPSULE | Freq: Two times a day (BID) | ORAL | Status: DC
  Administered 2017-10-01 – 2017-10-10 (×5): 100 mg via ORAL
  Filled 2017-09-29 (×13): qty 1

## 2017-09-29 MED ORDER — POTASSIUM CHLORIDE CRYS ER 20 MEQ PO TBCR: 40.0000 meq | EXTENDED_RELEASE_TABLET | Freq: Once | ORAL | Status: DC

## 2017-09-29 MED ORDER — ALBUMIN HUMAN 25 % IV SOLN
25.0000 g | Freq: Once | INTRAVENOUS | Status: AC
  Administered 2017-09-29: 25 g via INTRAVENOUS

## 2017-09-29 MED ORDER — PANTOPRAZOLE SODIUM 40 MG PO TBEC
40.0000 mg | DELAYED_RELEASE_TABLET | Freq: Every day | ORAL | Status: DC
  Administered 2017-09-29 – 2017-10-26 (×26): 40 mg via ORAL
  Filled 2017-09-29 (×27): qty 1

## 2017-09-29 MED ORDER — OXYCODONE HCL 5 MG PO TABS
5.0000 mg | ORAL_TABLET | Freq: Once | ORAL | Status: AC
  Administered 2017-09-29: 5 mg via ORAL

## 2017-09-29 MED ORDER — POTASSIUM CHLORIDE 10 MEQ/50ML IV SOLN
10.0000 meq | INTRAVENOUS | Status: AC
  Administered 2017-09-29 (×4): 10 meq via INTRAVENOUS
  Filled 2017-09-29 (×4): qty 50

## 2017-09-29 MED ORDER — RESOURCE THICKENUP CLEAR PO POWD
ORAL | Status: DC | PRN
  Filled 2017-09-29: qty 125

## 2017-09-29 MED ORDER — AMIODARONE HCL IN DEXTROSE 360-4.14 MG/200ML-% IV SOLN
60.0000 mg/h | INTRAVENOUS | Status: AC
  Administered 2017-09-29: 60 mg/h via INTRAVENOUS
  Filled 2017-09-29: qty 200

## 2017-09-29 MED ORDER — SODIUM CHLORIDE 0.9% FLUSH
10.0000 mL | Freq: Two times a day (BID) | INTRAVENOUS | Status: DC
  Administered 2017-09-29 – 2017-10-03 (×5): 10 mL

## 2017-09-29 MED ORDER — SODIUM CHLORIDE 0.9% FLUSH: 10.0000 mL | INTRAVENOUS | Status: DC | PRN

## 2017-09-29 MED ORDER — OXYCODONE HCL 5 MG PO TABS
ORAL_TABLET | ORAL | Status: AC
  Administered 2017-09-29: 5 mg via ORAL
  Filled 2017-09-29: qty 1

## 2017-09-29 MED ORDER — ALBUMIN HUMAN 25 % IV SOLN
INTRAVENOUS | Status: AC
  Administered 2017-09-29: 25 g via INTRAVENOUS
  Filled 2017-09-29: qty 100

## 2017-09-29 MED ORDER — AMIODARONE HCL IN DEXTROSE 360-4.14 MG/200ML-% IV SOLN
30.0000 mg/h | INTRAVENOUS | Status: DC
  Administered 2017-09-29 (×2): 30 mg/h via INTRAVENOUS
  Filled 2017-09-29 (×3): qty 200

## 2017-09-29 MED ORDER — CHLORHEXIDINE GLUCONATE CLOTH 2 % EX PADS
6.0000 | MEDICATED_PAD | Freq: Every day | CUTANEOUS | Status: DC
  Administered 2017-09-29 – 2017-10-04 (×5): 6 via TOPICAL

## 2017-09-29 NOTE — Progress Notes (Addendum)
Progress Note  Patient Name: [name] Date of Encounter: 09/29/2017  Primary Cardiologist: Mertie Moores, MD   Subjective   Pt seen on dialysis. No specific complaints, just weak  Inpatient Medications    Scheduled Meds: . atorvastatin  80 mg Oral Daily  . Chlorhexidine Gluconate Cloth  6 each Topical Daily  . darbepoetin (ARANESP) injection - DIALYSIS  60 mcg Intravenous Q Tue-HD  . dextrose  0.5 ampule Intravenous Once  . docusate sodium  100 mg Oral BID  . hydrocerin   Topical BID  . hydrocortisone sod succinate (SOLU-CORTEF) inj  50 mg Intravenous Daily  . insulin aspart  0-20 Units Subcutaneous Q4H  . insulin glargine  15 Units Subcutaneous QHS  . ipratropium-albuterol  3 mL Nebulization TID  . mouth rinse  15 mL Mouth Rinse BID  . pantoprazole  40 mg Oral Daily  . senna  1 tablet Oral BID  . senna-docusate  1 tablet Oral QHS  . sodium chloride flush  10-40 mL Intracatheter Q12H  . sodium chloride flush  3 mL Intravenous Q12H   Continuous Infusions: . sodium chloride 10 mL/hr at 09/29/17 0400  . amiodarone 30 mg/hr (09/29/17 1120)  . heparin 1,350 Units/hr (09/29/17 0400)   PRN Meds: acetaminophen, bisacodyl, camphor-menthol, heparin, nitroGLYCERIN, ondansetron (ZOFRAN) IV, polyvinyl alcohol, sodium chloride flush, sodium chloride flush   Vital Signs    Vitals:   09/29/17 0712 09/29/17 0735 09/29/17 0800 09/29/17 0900  BP:   (!) 108/52 127/75  Pulse:   64 (!) 121  Resp:   20 (!) 24  Temp:  98.1 F (36.7 C)    TempSrc:  Oral    SpO2: 100%  (!) 86% 94%  Weight:      Height:        Intake/Output Summary (Last 24 hours) at 09/29/2017 1127 Last data filed at 09/29/2017 0932 Gross per 24 hour  Intake 1293.85 ml  Output 95 ml  Net 1198.85 ml   Filed Weights   09/27/17 1117 09/28/17 0456 09/29/17 0600  Weight: 177 lb 7.5 oz (80.5 kg) 179 lb 7.3 oz (81.4 kg) 181 lb 3.5 oz (82.2 kg)    Telemetry    NSR now, he reportedly had recurrent AF with  RVR last night - Personally Reviewed  ECG    No new tracings - Personally Reviewed  Physical Exam   GEN: sleepy    Neck: CVC Cardiac: exam difficult with respiratory sounds, RRR  Respiratory: Coarse throughout GI: Soft, nontender, non-distended  MS: Mild edema, pulses very faint, partial right foot amputation Neuro:  orientation questions difficult to assess, pt sleepy Psych: Normal affect   Labs    Chemistry Recent Labs  Lab 09/24/17 0402  09/26/17 0322 09/26/17 1549 09/27/17 0328 09/28/17 1516 09/29/17 0304  NA 139  140   < > 140 139 139 135 136  K 3.7  3.7   < > 3.7 3.4* 3.7 3.8 3.2*  CL 105  106   < > 105 104 104 97* 97*  CO2 21*  21*   < > 22 22 23 24 26   GLUCOSE 137*  137*   < > 205* 291* 345* 254* 135*  BUN 21*  21*   < > 36* 52* 65* 62* 74*  CREATININE 1.68*  1.58*   < > 2.30* 2.67* 3.06* 2.70* 3.11*  CALCIUM 8.2*  8.2*   < > 8.7* 8.6* 8.7* 8.1* 8.3*  PROT 5.9*  --   --   --   --   --   --  ALBUMIN 1.8*  1.8*   < > 2.0* 2.0* 2.1*  --   --   AST 22  --   --   --   --   --   --   ALT 12*  --   --   --   --   --   --   ALKPHOS 47  --   --   --   --   --   --   BILITOT 0.8  --   --   --   --   --   --   GFRNONAA 42*  45*   < > 29* 24* 20* 24* 20*  GFRAA 48*  52*   < > 33* 28* 23* 27* 23*  ANIONGAP 13  13   < > 13 13 12 14 13    < > = values in this interval not displayed.     Hematology Recent Labs  Lab 09/27/17 0328 09/28/17 1516 09/29/17 0304  WBC 21.5* 14.6* 12.4*  RBC 3.17* 2.99* 2.71*  HGB 8.5* 7.7* 7.2*  HCT 27.2* 25.2* 22.7*  MCV 85.8 84.3 83.8  MCH 26.8 25.8* 26.6  MCHC 31.3 30.6 31.7  RDW 16.3* 16.0* 15.8*  PLT 217 141* 120*    Cardiac EnzymesNo results for input(s): TROPONINI in the last 168 hours. No results for input(s): TROPIPOC in the last 168 hours.   BNPNo results for input(s): BNP, PROBNP in the last 168 hours.   DDimer No results for input(s): DDIMER in the last 168 hours.   Radiology    Dg Chest Port 1  View  Result Date: 09/29/2017 CLINICAL DATA:  Acute respiratory failure EXAM: PORTABLE CHEST 1 VIEW COMPARISON:  09/27/2017 FINDINGS: Prior CABG. Interval extubation and removal of NG tube. Heart is mildly enlarged. Vascular congestion and interstitial prominence may reflect interstitial edema, slightly increased. Bibasilar atelectasis. No pneumothorax. IMPRESSION: Increasing interstitial prominence, likely interstitial edema. Increasing bibasilar atelectasis. Electronically Signed   By: Rolm Baptise M.D.   On: 09/29/2017 07:38    Cardiac Studies   Echo 09/18/17: Study Conclusions - Left ventricle: The cavity size was normal. Wall thickness was   increased in a pattern of mild LVH. Indeterminant diastolic   function, atrial fibrillation. Systolic function was mildly to   moderately reduced. The estimated ejection fraction was in the   range of 40% to 45%. Diffuse hypokinesis. - Aortic valve: There was no stenosis. - Mitral valve: Moderately calcified annulus. There was no   significant regurgitation. - Left atrium: The atrium was mildly dilated. - Right ventricle: The cavity size was mildly to moderately   dilated. Pacer wire or catheter noted in right ventricle.   Systolic function was mildly reduced. - Right atrium: The atrium was mildly dilated. - Tricuspid valve: Peak RV-RA gradient (S): 32 mm Hg. - Pulmonary arteries: PA peak pressure: 35 mm Hg (S). - Inferior vena cava: The vessel was normal in size. The   respirophasic diameter changes were in the normal range (>= 50%),   consistent with normal central venous pressure.  Impressions: - The patient was in atrial fibrillation. Normal LV size with mild   LV hypertrophy. EF 40-45%. Mild-moderate RV dilation with mildly   decreased systolic function. Borderline pulmonary hypertension.   Patient Profile     62 y.o. male admitted with influenza and respiratory failure requiring intubation, shock requiring IV pressors, and atrial  fib with a RVR and slow VR and pauses, s/p temporary PM.   Assessment &  Plan    1. Atrial fibrillation, tachy brady syndrome EP placed temporary pacer on 09/15/17 while waiting to place a PPM. This was removed 09/25/17. He has had recurrent PAF with RVR- on 2/20 and again last night- IV Amiodarone restarted. He I has been in and out of AF since that time   2. Cardiomyopathy EF 40-45%  3. Acute on chronic renal failure SCr 3- on HD today  4. Respiratory failure Positive for influenza on admission. Extubated now, slowly improving.  5. Mentally handicapped  Plan: Dr Acie Fredrickson to see. The plan was to stop the Amiodarone but he had recurrent PAF with RVR last night and it was restarted. In NSR now. K+ 3.2 this AM- would try and keep K+ closer to 4.0.   For questions or updates, please contact [place] Please consult [email or web address] for contact info under Cardiology/STEMI.      Angelena Form, PA-C  09/29/2017, 11:27 AM    Attending Note:   The patient was seen and examined.  Agree with assessment and plan as noted above.  Changes made to the above note as needed.  Patient seen and independently examined with Kerin Ransom, PA .   We discussed all aspects of the encounter. I agree with the assessment and plan as stated above.  1.   PAF :   Has dx of SSS EP has commented that he will likely need a pacer. They wanted to wait until he was stronger and over the flu.  2.  Acute on chronic systolic CHF:   Better   3. Acute renal insufficiency  - getting dialysis   4. CAD:   no angina     I have spent a total of 40 minutes with patient reviewing hospital  notes , telemetry, EKGs, labs and examining patient as well as establishing an assessment and plan that was discussed with the patient. > 50% of time was spent in direct patient care.    Thayer Headings, Brooke Bonito., MD, [facility] 09/29/2017, 2:28 PM [address],  [place] Pager [phone]

## 2017-09-29 NOTE — Progress Notes (Addendum)
PULMONARY / CRITICAL CARE MEDICINE   Name: [name] MRN: [id number] DOB: 12/29/1955    ADMISSION DATE:  09/15/2017 CONSULTATION DATE: 09/18/2017  REFERRING MD: Neva Seat  Reason for consult: hypotension and worsening aA gradient  HISTORY OF PRESENT ILLNESS:   This is a 62 year old diabetic with a history of coronary disease and congestive heart failure with a baseline ejection fraction of 40-50%.  He was admitted on 2/8 complaining of generalized weakness and was found to be in atrial fibrillation with a rapid ventricular response with intermittent 7-8-second pauses.  A temporary pacing wire was placed , now removed.  He was noted to have infiltrates or CHF on his chest x-ray.  PCR for influenza a was positive.  He was started on a combination of Tamiflu as well as clindamycin and aztreonam due to a penicillin allergy.  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 He developed AKI requiring transient CRRT 2/14-2/18.  ANTIBIOTICS: Tamiflu 2/8 > 2/12  Cefepime 2/12 >  Linezolid 2/12 >  Clinda 2/11 > 2/12 2/11 azactam>> 2/12  LINES/TUBES: ETT 2/12 > 09/28/2017 L brachial art line 2/12 > out  L IJ CVL 2/12 >  RIJ HD cath >>   SUBJECTIVE:    States he feels better  Afebrile Extubated 2/21 at 3 pm Following commands, T Max 99.3 For HD today  VITAL SIGNS: BP 127/75   Pulse (!) 121   Temp 98.1 F (36.7 C) (Oral)   Resp (!) 24   Ht 5' (1.524 m)   Wt 181 lb 3.5 oz (82.2 kg)   SpO2 94%   BMI 35.39 kg/m   HEMODYNAMICS:    VENTILATOR SETTINGS: Vent Mode: PSV;CPAP FiO2 (%):  [30 %] 30 % PEEP:  [5 cmH20] 5 cmH20 Pressure Support:  [5 cmH20] 5 cmH20  INTAKE / OUTPUT: I/O last 3 completed shifts: In: 2595.8 [I.V.:1635.8; NG/GT:860; IV Piggyback:100] Out: 170 [Urine:170]  PHYSICAL EXAMINATION:   Gen:     Chronically ill appearing male, No acute distress , following commands HEENT:  EOMI, sclera anicteric, [place] at 3 L Neck:     No  masses; no thyromegaly Lungs:   resps even non labored, diminished bases  Rhonchi to clear CV:         S1, S2, Regular rate and rhythm;  No RMG Abd:     abd large, soft, + bowel sounds; soft, non-tender; no palpable masses, no distension,   Ext:    Trace  edema; adequate peripheral perfusion Skin:      Warm and dry and intact ; no rash or lesions noted Neuro:  Awake, follows commands , non focal  LABS:  BMET Recent Labs  Lab 09/27/17 0328 09/28/17 1516 09/29/17 0304  NA 139 135 136  K 3.7 3.8 3.2*  CL 104 97* 97*  CO2 23 24 26   BUN 65* 62* 74*  CREATININE 3.06* 2.70* 3.11*  GLUCOSE 345* 254* 135*    Electrolytes Recent Labs  Lab 09/27/17 0328 09/27/17 1700 09/28/17 1516 09/29/17 0304  CALCIUM 8.7*  --  8.1* 8.3*  MG 2.4 1.8  --  2.1  PHOS 2.2* 1.4*  --  2.6    CBC Recent Labs  Lab 09/27/17 0328 09/28/17 1516 09/29/17 0304  WBC 21.5* 14.6* 12.4*  HGB 8.5* 7.7* 7.2*  HCT 27.2* 25.2* 22.7*  PLT 217 141* 120*    Coag's No results for input(s): APTT, INR in the last 168 hours.  Sepsis Markers Recent Labs  Lab  09/26/17 0945 09/27/17 0328 09/28/17 0454  PROCALCITON 0.50 0.49 0.75    ABG Recent Labs  Lab 09/24/17 0322 09/25/17 1633  PHART 7.412 7.405  PCO2ART 38.0 33.4  PO2ART 107 105.0    Liver Enzymes Recent Labs  Lab 09/24/17 0402  09/26/17 0322 09/26/17 1549 09/27/17 0328  AST 22  --   --   --   --   ALT 12*  --   --   --   --   ALKPHOS 47  --   --   --   --   BILITOT 0.8  --   --   --   --   ALBUMIN 1.8*  1.8*   < > 2.0* 2.0* 2.1*   < > = values in this interval not displayed.    Cardiac Enzymes No results for input(s): TROPONINI, PROBNP in the last 168 hours.  Glucose Recent Labs  Lab 09/28/17 1144 09/28/17 1539 09/28/17 1956 09/28/17 2305 09/29/17 0317 09/29/17 0731  GLUCAP 226* 243* 205* 164* 118* 107*    Imaging Dg Chest Port 1 View  Result Date: 09/29/2017 CLINICAL DATA:  Acute respiratory failure EXAM:  PORTABLE CHEST 1 VIEW COMPARISON:  09/27/2017 FINDINGS: Prior CABG. Interval extubation and removal of NG tube. Heart is mildly enlarged. Vascular congestion and interstitial prominence may reflect interstitial edema, slightly increased. Bibasilar atelectasis. No pneumothorax. IMPRESSION: Increasing interstitial prominence, likely interstitial edema. Increasing bibasilar atelectasis. Electronically Signed   By: Rolm Baptise M.D.   On: 09/29/2017 07:38      DISCUSSION: Weaning well , off CRRT For HD 2/22   ASSESSMENT / PLAN:  Acute hypoxemic respiratory failure with bilateral pulmonary infiltrates Flu A, bacterial pneumonia. Vascular edema per CXR 2/22 Weak cough Plan: Extubated 2/22 Titrate oxygen for sats > 94% Continued Volume removal with HD  S/p full course zyvox, cefepime - stopped 2/19 For swallow eval 2/22 ( Barium swallow)   Septic, cardiogenic shock Intermittent atrial fibrillation with Tachy/Brady syndrome  Had to resume Amiodarone gtt 2/22 pm for a fib with RVR  Plan: Temp pacer pulled 2/18, cards planning PPM once over acute issues Continue amiodarone -  defer to cards  heparin per cards   Adrenal insuff/ - Reduce solucortef 50 q24 - consider transition back to home florinef when able to take PO  AKI - oliguric AGMA - ?uremia Hypomagnesemia>> resolved Hypokalemia 2/22>> repleted Renal following, intermittent HD  Needs tunneled cath placement - for placement next week 2/25 if no improved urine output Replete electrolytes as needed Monitor BMET  Anemia - chronic Follow CBC Continue ESA per renal  DM SSI, Lantus - increased lantus to 15units daily on 2/20  Swallow Will need to transition to renal diet once passes swallow eval  Acute metabolic encephalopathy -improved.    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  Will need ENT consult once improved.   Will transition care to [facility] . Please  re-consult if we can be of further assistance   Magdalen Spatz, [facility] Pager [phone]     09/29/2017  9:57 AM

## 2017-09-29 NOTE — Progress Notes (Signed)
ANTICOAGULATION CONSULT NOTE - Follow Up Consult  Pharmacy Consult for Heparin Indication: atrial fibrillation  Allergies  Allergen Reactions  . Penicillins Hives, Nausea And Vomiting, Swelling and Other (See Comments)    Tolerated Cefepime Has patient had a PCN reaction causing immediate rash, facial/tongue/throat swelling, SOB or lightheadedness with hypotension: YES Has patient had a PCN reaction causing severe rash involving mucus membranes or skin necrosis: No Has patient had a PCN reaction that required hospitalization No Has patient had a PCN reaction occurring within the last 10 years: No If all of the above answers are "NO", then may proceed with Cephalosporin use.     Patient Measurements: Height: 5' (152.4 cm) Weight: 181 lb 3.5 oz (82.2 kg) IBW/kg (Calculated) : 50  Vital Signs: Temp: 98.1 F (36.7 C) (02/22 0735) Temp Source: Oral (02/22 0735) BP: 127/75 (02/22 0900) Pulse Rate: 121 (02/22 0900)  Labs: Recent Labs    09/27/17 0328 09/28/17 0454 09/28/17 1516 09/29/17 0304  HGB 8.5*  --  7.7* 7.2*  HCT 27.2*  --  25.2* 22.7*  PLT 217  --  141* 120*  HEPARINUNFRC 0.51 0.29*  --  0.37  CREATININE 3.06*  --  2.70* 3.11*    Estimated Creatinine Clearance: 21.6 mL/min (A) (by C-G formula based on SCr of 3.11 mg/dL (H)).   Assessment:  62 year old male on IV heparin for atrial fibrillation.  Heparin level now therapeutic at 0.37 on 1350 units/hr. Hgb is down to 7.2 and platelets down to 120. Of note, patient was on linezolid until 2/19 which can thrombocytopenia. Additionally, patient is approximately 5 L positive over last 3 days.   No bleeding or issues with infusion are noted.   Goal of Therapy:  Heparin level 0.3-0.7 units/ml Monitor platelets by anticoagulation protocol: Yes   Plan:  Continue IV heparin at 1350 units/hr  Daily heparin level and CBC Continue to monitor platelet trend   Albertina Parr, PharmD., BCPS Clinical  Pharmacist Pager [phone]

## 2017-09-29 NOTE — Progress Notes (Signed)
Modified Barium Swallow Progress Note  Patient Details  Name: [name] MRN: [id number] Date of Birth: 1956-08-02  Today's Date: 09/29/2017  Modified Barium Swallow completed.  Full report located under Chart Review in the Imaging Section.  Brief recommendations include the following:  Clinical Impression  Pt exhibited mild pharyngeal dysphagia resulting in penetration with nectar and honey barium due to delayed closing of larynx. Pt penetrated with honey thick x 1 (mild) occurred during verbalization. Partial clearance of laryngeal vestibule with verbal cues for hard cough. There was min-mild vallecular residue most clearing with subsequent swallows. Mastication of soft cereal bar was functional. MBS does not diagnose below the level of the UES however brief esohageal scan did not reveal abnormalities (pt has hx of presbyesophagus). Recommend Dys 2, honey thick liquids, full supervision and assist, intermittent cough and double swallow, crush pills and remain upright after meals. Prognosis is good for return to unrestricted liquids given increased time post extubation and increased cognitive clarity.      Swallow Evaluation Recommendations       SLP Diet Recommendations: Dysphagia 2 (Fine chop) solids;Honey thick liquids   Liquid Administration via: Cup   Medication Administration: Crushed with puree   Supervision: Staff to assist with self feeding;Patient able to self feed   Compensations: Minimize environmental distractions;Slow rate;Small sips/bites;Clear throat intermittently   Postural Changes: Seated upright at 90 degrees   Oral Care Recommendations: Oral care BID        Houston Siren 09/29/2017,12:16 PM   Orbie Pyo [place].[employer] [phone]

## 2017-09-29 NOTE — Evaluation (Signed)
Clinical/Bedside Swallow Evaluation Patient Details  Name: [name] MRN: [id number] Date of Birth: 12-17-1955  Today's Date: 09/29/2017 Time: SLP Start Time (ACUTE ONLY): 72 SLP Stop Time (ACUTE ONLY): 0929 SLP Time Calculation (min) (ACUTE ONLY): 14 min  Past Medical History:  Past Medical History:  Diagnosis Date  . Abdominal hernia    Chronic, not a good surgical candidate  . Abscess, abdomen 12/31/2010   Referred to Wound Care in 01/2011 because of multiple abd abscess with VERY large ventral hernia (please look at image of CT abd/pelvis 09/2010).  Because of hernia I was hesitant to I&D.      Marland Kitchen Anemia    History of Iron Def Anemia  . Anxiety   . AVM (arteriovenous malformation)    chronic GI blood loss  . Carotid artery disease ([place])    s/p CEA  . Chronic diastolic CHF (congestive heart failure) (HCC)    takes Lasix  . Chronic low back pain   . COPD (chronic obstructive pulmonary disease) ([place])   . CVA (cerebral infarction) 09/2010   Bilateral with Left > Right  . Diabetes mellitus   . GERD (gastroesophageal reflux disease)   . History of nuclear stress test    Myoview 9/16:  Inferior, apical and inf-lateral ischemia; not gated; High Risk  . Hx of echocardiogram    Echo 5/16:  Mild LVH, EF 55%, indeterm. diast function, WMA could not be ruled out, MAC, trivial MR, mild LAE, normal RVF //  b. Echo 4/17: EF 55-60%, no RWMA, Gr 2 DD, Ao sclerosis, MAC, mild MS, mild LAE, PASP 55 mHg  . Hyperlipidemia   . Hypertension   . Intellectual disability    Sister helps to take care of him and takes him to appts  . Itching    all over body; pt scratches and has sores on bilateral arms and abdomen  . Lung nodule   . Myocardial infarction [facility]) 2016 ?   Heart attack  (  Per  pt. )  . Obesity   . Oxygen dependent    wears 2 liters at bedtime and when needed  . PAF (paroxysmal atrial fibrillation) ([place]) 06/2009   CHADS score 2 (HTN, DM) // Pradaxa for Afib // Pradaxa stopped  due to worsening anemia (Hgb in 5/18 8.5 >> Pradaxa remains on hold)   . Pneumonia    hx of  . Stroke ([place])   . Tobacco user    Smokes 1ppd for multiple years.  Quit after hosp 09/2010.  . Tubular adenoma of colon   . Wears glasses    Past Surgical History:  Past Surgical History:  Procedure Laterality Date  . AMPUTATION Right 03/29/2013   Procedure: AMPUTATION RAY;  Surgeon: Newt Minion, MD;  Location: [place];  Service: Orthopedics;  Laterality: Right;  Right Foot 3rd and Possible 4th Ray Amputation  . AMPUTATION Right 04/23/2013   Procedure: AMPUTATION RIGHT MID-FOOT;  Surgeon: Newt Minion, MD;  Location: [place];  Service: Orthopedics;  Laterality: Right;  . arm surgery Left    as a child  . CARDIAC CATHETERIZATION N/A 04/30/2015   Procedure: Left Heart Cath and Coronary Angiography;  Surgeon: Belva Crome, MD;  Location: [place] CV [facility];  Service: Cardiovascular;  Laterality: N/A;  . Carotid arteriogram  10/2010   30% right ICA stenosis, 40% left ICA stenosis   . CAROTID ENDARTERECTOMY Left 08-13-15   CEA  . CATARACT EXTRACTION, BILATERAL    . COLONOSCOPY WITH  PROPOFOL N/A 06/10/2014   Procedure: COLONOSCOPY WITH PROPOFOL;  Surgeon: Jerene Bears, MD;  Location: [facility];  Service: Gastroenterology;  Laterality: N/A;  . CORONARY ARTERY BYPASS GRAFT N/A 08/13/2015   Procedure: CORONARY ARTERY BYPASS GRAFTING (CABG), ON PUMP, TIMES THREE, USING LEFT INTERNAL MAMMARY ARTERY, RIGHT GREATER SAPHENOUS VEIN HARVESTED ENDOSCOPICALLY;  Surgeon: [name], MD;  Location: [place];  Service: Open Heart Surgery;  Laterality: N/A;  . DEBRIDMENT OF DECUBITUS ULCER Right 02/13/2013  . ENDARTERECTOMY Left 08/13/2015   Procedure: ENDARTERECTOMY CAROTID;  Surgeon: Angelia Mould, MD;  Location: [place];  Service: Vascular;  Laterality: Left;  . ESOPHAGOGASTRODUODENOSCOPY (EGD) WITH PROPOFOL N/A 06/10/2014   Procedure: ESOPHAGOGASTRODUODENOSCOPY (EGD) WITH PROPOFOL;  Surgeon: Jerene Bears, MD;   [facility];  Service: Gastroenterology;  Laterality: N/A;  . GIVENS CAPSULE STUDY N/A 07/09/2014   Procedure: GIVENS CAPSULE STUDY;  Surgeon: Jerene Bears, MD;  [facility]: [facility];  Service: Gastroenterology;  Laterality: N/A;  . I&D EXTREMITY Right 02/13/2013   Procedure: IRRIGATION AND DEBRIDEMENT FOOT ULCER;  Surgeon: Johnny Bridge, MD;  Location: [place];  Service: Orthopedics;  Laterality: Right;  PULSE LAVAGE  . MULTIPLE TOOTH EXTRACTIONS    . TEE WITHOUT CARDIOVERSION N/A 08/13/2015   Procedure: TRANSESOPHAGEAL ECHOCARDIOGRAM (TEE);  Surgeon: [name], MD;  Location: [place];  Service: Open Heart Surgery;  Laterality: N/A;  . TEMPORARY PACEMAKER N/A 09/15/2017   Procedure: TEMPORARY PACEMAKER;  Surgeon: Jettie Booze, MD;  Location: [place] [facility];  Service: Cardiovascular;  Laterality: N/A;  . TRANSESOPHAGEAL ECHOCARDIOGRAM  09/2010   No ASD or PFO. EF 60-65%.  Normal systolic function. No evidence of thrombus.   . TRANSTHORACIC ECHOCARDIOGRAM  09/2010    The cavity size was normal. Systolic function was vigorous.  EF 65-70%.  Normal wall funciton.   Marland Kitchen ULTRASOUND GUIDANCE FOR VASCULAR ACCESS  09/15/2017   Procedure: Ultrasound Guidance For Vascular Access;  Surgeon: Jettie Booze, MD;  Location: [place] CV [facility];  Service: Cardiovascular;;   HPI:  62 yo M with History of paroxysmal A. Fib, DM, CHF, CAD, CABG 2017, carotid stenosis (S/P L Carotid Endarterectomy), GERD, CVA, COPD, HTN, Abdominal Hernia, and Diabetes presented  generalized weakness and was found to have flu and was in A. Fib with RVR. Developed AKI requiring transient CRRT. Acute hypoxemic respiratory failure with bilateral pulmonary infiltrates. Intubated 2/12-2/21. CXR 2/22 increasing interstitial prominence, likely interstitial edema. Increasing bibasilar atelectasis. BSE 08/12/16 primary esophageal dysphagia. Esophagram same day no esophageal obstruction or stricture. Presbyesophagus.  penetration of barium to the level of the vocal cords occurred during the exam and did elicit intermittent coughing. Recommend followup speech pathology evaluation to evaluate for aspiration risk.   Assessment / Plan / Recommendation Clinical Impression  Pt is at risk for aspiration due to history of presbyesophagus, penetration during esophagram and prolonged intubation (10 days). Slower but adequate labial/facial/lingual movements, endetulous at dysarthric (likely baseline). Delayed cough at end of assessment following trials of thin and puree and unable to fully assess integrity of swallow therefore MBS is scheduled at 10:30 today.  SLP Visit Diagnosis: Dysphagia, unspecified (R13.10)    Aspiration Risk  Moderate aspiration risk    Diet Recommendation NPO        Other  Recommendations Oral Care Recommendations: Oral care QID   Follow up Recommendations Other (comment)(TBD)      Frequency and Duration            Prognosis  Swallow Study   General HPI: 62 yo M with History of paroxysmal A. Fib, DM, CHF, CAD, CABG 2017, carotid stenosis (S/P L Carotid Endarterectomy), GERD, CVA, COPD, HTN, Abdominal Hernia, and Diabetes presented  generalized weakness and was found to have flu and was in A. Fib with RVR. Developed AKI requiring transient CRRT. Acute hypoxemic respiratory failure with bilateral pulmonary infiltrates. Intubated 2/12-2/21. CXR 2/22 increasing interstitial prominence, likely interstitial edema. Increasing bibasilar atelectasis. BSE 08/12/16 primary esophageal dysphagia. Esophagram same day no esophageal obstruction or stricture. Presbyesophagus. penetration of barium to the level of the vocal cords occurred during the exam and did elicit intermittent coughing. Recommend followup speech pathology evaluation to evaluate for aspiration risk. Type of Study: Bedside Swallow Evaluation Previous Swallow Assessment: (see HPI) Diet Prior to this Study: NPO Temperature  Spikes Noted: No Respiratory Status: Nasal cannula History of Recent Intubation: Yes Length of Intubations (days): 10 days Date extubated: 09/28/17 Behavior/Cognition: Alert;Cooperative;Pleasant mood;Lethargic/Drowsy Oral Cavity Assessment: Dry Oral Care Completed by SLP: Yes Oral Cavity - Dentition: Edentulous(doesn't wear dentures to eat) Vision: Functional for self-feeding Self-Feeding Abilities: Needs assist Patient Positioning: Upright in bed Baseline Vocal Quality: Low vocal intensity Volitional Cough: Strong Volitional Swallow: Able to elicit    Oral/Motor/Sensory Function Overall Oral Motor/Sensory Function: Other (comment)(slower movements)   Ice Chips Ice chips: Not tested   Thin Liquid Thin Liquid: Impaired Presentation: Cup;Straw Pharyngeal  Phase Impairments: Cough - Delayed;Other (comments)(congestion)    Nectar Thick Nectar Thick Liquid: Not tested   Honey Thick Honey Thick Liquid: Not tested   Puree Puree: Within functional limits   Solid   GO   Solid: Not tested        [place] Siren 09/29/2017,9:47 AM   Orbie Pyo Colvin Caroli.Ed [employer] [phone]

## 2017-09-29 NOTE — Progress Notes (Signed)
Patient arrived to unit by bed.  Reviewed treatment plan and this RN agrees with plan.  Report received from bedside RN, Verdis Frederickson.  Consent verified.  Patient A & O X 4.   Lung sounds diminished to ausculation in all fields. Generalized +1 pitting edema. Cardiac:  NSR.  Removed caps and cleansed RIJ catheter with chlorhedxidine.  Aspirated ports of heparin and flushed them with saline per protocol.  Connected and secured lines, initiated treatment at 1114.  UF Goal of 2500 mL and net fluid removal 2 L.  Will continue to monitor.

## 2017-09-29 NOTE — Progress Notes (Signed)
PT Cancellation Note  Patient Details Name: [name] MRN: [id number] DOB: [birth year]   Cancelled Treatment:    Reason Eval/Treat Not Completed: Patient not medically ready. Pt is currently on strict bed rest. PT will continue to f/u with patient and await updated activity orders prior to initiating evaluation.    [place] 09/29/2017, 8:08 AM

## 2017-09-29 NOTE — Progress Notes (Signed)
Nutrition Follow-up  DOCUMENTATION CODES:   Obesity unspecified  INTERVENTION:   Magic cup TID with meals, each supplement provides 290 kcal and 9 grams of protein  Feeding assistance at meal times  Recommend removal of Carb Modified to current diet order  NUTRITION DIAGNOSIS:   Increased nutrient needs related to acute illness as evidenced by estimated needs.  Continues but being addressed as diet advanced post extubation, supplement  GOAL:   Patient will meet greater than or equal to 90% of their needs  Progressing  MONITOR:   PO intake, Supplement acceptance, Labs, Weight trends, Skin  REASON FOR ASSESSMENT:   Consult Enteral/tube feeding initiation and management  ASSESSMENT:   2/20 iHD started 2/21 Extubated 2/22 2nd HD treatment today, net UF 2 L 2/2 Diet advanced to Dysphagia II, Honey Thick  Diet just advanced, no po intake yet  TF discontinued with extubation  Weight has fluctuated since admission; current weight higher than admission weight. Minimal UOP. Net positive for admission, mild edema present.   Labs: sodium 133, phosphorus 2.5 Meds: ss novolog, lantus   Diet Order:  DIET DYS 2 Room service appropriate? Yes; Fluid consistency: Honey Thick  EDUCATION NEEDS:   No education needs have been identified at this time  Skin:  Skin Assessment: Skin Integrity Issues: Skin Integrity Issues:: Other (Comment) DTI: sacrum Other: MASD: groin, buttocks, coccyx  Last BM:  2/21  Height:   Ht Readings from Last 1 Encounters:  09/25/17 5' (1.524 m)    Weight:   Wt Readings from Last 1 Encounters:  09/29/17 181 lb 3.5 oz (82.2 kg)    Ideal Body Weight:  48.1 kg  BMI:  Body mass index is 35.39 kg/m.  Estimated Nutritional Needs:   Kcal:  1900-2100 kcals   Protein:  100-115 g  Fluid:  1000 mL plus UOP   [employer] MS, RD, LDN, CNSC [phone] Pager  [phone] Weekend/On-Call Pager

## 2017-09-29 NOTE — Progress Notes (Signed)
Patient was transferred to Radiology. Barium swallow was done. No changes, pt VS stable. Report was given to HD RN. Pt was transferred to HD. VS stable.  Bed in low position, alarms are on, call bell in reach.

## 2017-09-29 NOTE — Progress Notes (Signed)
Admit: 09/15/2017 LOS: 14  37M with anuric AKI (BL SCr 1.6 - 1.7) on CRRT  Subjective:  Extubated yesterday Minimal UOP BP stable, on [place]  02/21 0701 - 02/22 0700 In: 1507.2 [I.V.:1077.2; NG/GT:380; IV Piggyback:50] Out: 125 [Urine:125]  Filed Weights   09/27/17 1117 09/28/17 0456 09/29/17 0600  Weight: 80.5 kg (177 lb 7.5 oz) 81.4 kg (179 lb 7.3 oz) 82.2 kg (181 lb 3.5 oz)    Scheduled Meds: . atorvastatin  80 mg Per Tube Daily  . Chlorhexidine Gluconate Cloth  6 each Topical Daily  . darbepoetin (ARANESP) injection - DIALYSIS  60 mcg Intravenous Q Tue-HD  . dextrose  0.5 ampule Intravenous Once  . docusate  100 mg Per Tube BID  . hydrocerin   Topical BID  . hydrocortisone sod succinate (SOLU-CORTEF) inj  50 mg Intravenous Daily  . insulin aspart  0-20 Units Subcutaneous Q4H  . insulin glargine  15 Units Subcutaneous QHS  . ipratropium-albuterol  3 mL Nebulization TID  . mouth rinse  15 mL Mouth Rinse BID  . pantoprazole sodium  40 mg Per Tube Daily  . senna  1 tablet Oral BID  . senna-docusate  1 tablet Oral QHS  . sodium chloride flush  10-40 mL Intracatheter Q12H  . sodium chloride flush  3 mL Intravenous Q12H   Continuous Infusions: . sodium chloride 10 mL/hr at 09/29/17 0400  . amiodarone 30 mg/hr (09/29/17 0600)  . heparin 1,350 Units/hr (09/29/17 0400)  . potassium chloride Stopped (09/29/17 0720)   PRN Meds:.acetaminophen, bisacodyl, camphor-menthol, heparin, nitroGLYCERIN, ondansetron (ZOFRAN) IV, polyvinyl alcohol, sodium chloride flush, sodium chloride flush  Current Labs: reviewed  Results for CAGE, GUPTON (MRN [id number]) as of 09/27/2017 08:49  Ref. Range 09/26/2017 09:45  Saturation Ratios Latest Ref Range: 17.9 - 39.5 % 52 (H)  Ferritin Latest Ref Range: 24 - 336 ng/mL 816 (H)    Physical Exam:  Blood pressure (!) 134/59, pulse 66, temperature 98.1 F (36.7 C), temperature source Oral, resp. rate (!) 25, height 5' (1.524 m), weight 82.2 kg (181 lb  3.5 oz), SpO2 100 %. 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 AFib with RVR; bradycardia was with temp pacer; likely to need PPM when improved 3. Influenza A with VDRF 4. Septic Shock improving off pressors 5. ASCVD hx/o MI, PAD and CVA (hx/o CEA and CABG) 6. Anemia, not Fe deficient; on ESA 7. DM2  P 1. iHD today: Goal UF 2L, albumin 25gm IV x3 prn low BP, tight heparin, 4K 2. Hold on pursuing [facility]; watch over the weekend and see if UOP picks up.  Plan for tunneling next week with AVF/G eval if not improving   [name] MD 09/29/2017, 8:46 AM  Recent Labs  Lab 09/27/17 0328 09/27/17 1700 09/28/17 1516 09/29/17 0304  NA 139  --  135 136  K 3.7  --  3.8 3.2*  CL 104  --  97* 97*  CO2 23  --  24 26  GLUCOSE 345*  --  254* 135*  BUN 65*  --  62* 74*  CREATININE 3.06*  --  2.70* 3.11*  CALCIUM 8.7*  --  8.1* 8.3*  PHOS 2.2* 1.4*  --  2.6   Recent Labs  Lab 09/27/17 0328 09/28/17 1516 09/29/17  0304  WBC 21.5* 14.6* 12.4*  HGB 8.5* 7.7* 7.2*  HCT 27.2* 25.2* 22.7*  MCV 85.8 84.3 83.8  PLT 217 141* 120*

## 2017-09-30 ENCOUNTER — Inpatient Hospital Stay (HOSPITAL_COMMUNITY): Payer: Medicare (Managed Care)

## 2017-09-30 DIAGNOSIS — I5020 Unspecified systolic (congestive) heart failure: Secondary | ICD-10-CM

## 2017-09-30 DIAGNOSIS — Z8719 Personal history of other diseases of the digestive system: Secondary | ICD-10-CM

## 2017-09-30 DIAGNOSIS — N183 Chronic kidney disease, stage 3 (moderate): Secondary | ICD-10-CM

## 2017-09-30 DIAGNOSIS — I255 Ischemic cardiomyopathy: Secondary | ICD-10-CM

## 2017-09-30 DIAGNOSIS — J1000 Influenza due to other identified influenza virus with unspecified type of pneumonia: Secondary | ICD-10-CM

## 2017-09-30 DIAGNOSIS — G3184 Mild cognitive impairment, so stated: Secondary | ICD-10-CM

## 2017-09-30 DIAGNOSIS — R1310 Dysphagia, unspecified: Secondary | ICD-10-CM

## 2017-09-30 LAB — COMPREHENSIVE METABOLIC PANEL
ALBUMIN: 2.3 g/dL — AB (ref 3.5–5.0)
ALT: 11 U/L — AB (ref 17–63)
AST: 14 U/L — AB (ref 15–41)
Alkaline Phosphatase: 36 U/L — ABNORMAL LOW (ref 38–126)
Anion gap: 12 (ref 5–15)
BUN: 34 mg/dL — AB (ref 6–20)
CHLORIDE: 98 mmol/L — AB (ref 101–111)
CO2: 27 mmol/L (ref 22–32)
CREATININE: 2.26 mg/dL — AB (ref 0.61–1.24)
Calcium: 8.3 mg/dL — ABNORMAL LOW (ref 8.9–10.3)
GFR calc Af Amer: 34 mL/min — ABNORMAL LOW (ref >60)
GFR, EST NON AFRICAN AMERICAN: 29 mL/min — AB (ref >60)
Glucose, Bld: 97 mg/dL (ref 65–99)
POTASSIUM: 3.5 mmol/L (ref 3.5–5.1)
SODIUM: 137 mmol/L (ref 135–145)
Total Bilirubin: 0.5 mg/dL (ref 0.3–1.2)
Total Protein: 4.9 g/dL — ABNORMAL LOW (ref 6.5–8.1)

## 2017-09-30 LAB — GLUCOSE, CAPILLARY
GLUCOSE-CAPILLARY: 90 mg/dL (ref 65–99)
GLUCOSE-CAPILLARY: 80 mg/dL (ref 65–99)
GLUCOSE-CAPILLARY: 94 mg/dL (ref 65–99)
GLUCOSE-CAPILLARY: 104 mg/dL — AB (ref 65–99)
Glucose-Capillary: 83 mg/dL (ref 65–99)
Glucose-Capillary: 134 mg/dL — ABNORMAL HIGH (ref 65–99)

## 2017-09-30 LAB — CBC
HCT: 22.5 % — ABNORMAL LOW (ref 39.0–52.0)
HCT: 23 % — ABNORMAL LOW (ref 39.0–52.0)
HEMOGLOBIN: 7.2 g/dL — AB (ref 13.0–17.0)
Hemoglobin: 7.1 g/dL — ABNORMAL LOW (ref 13.0–17.0)
MCH: 26.4 pg (ref 26.0–34.0)
MCH: 26.5 pg (ref 26.0–34.0)
MCHC: 31.6 g/dL (ref 30.0–36.0)
MCHC: 31.3 g/dL (ref 30.0–36.0)
MCV: 83.6 fL (ref 78.0–100.0)
MCV: 84.6 fL (ref 78.0–100.0)
PLATELETS: 100 10*3/uL — AB (ref 150–400)
PLATELETS: 91 10*3/uL — AB (ref 150–400)
RBC: 2.69 MIL/uL — ABNORMAL LOW (ref 4.22–5.81)
RBC: 2.72 MIL/uL — ABNORMAL LOW (ref 4.22–5.81)
RDW: 15.8 % — AB (ref 11.5–15.5)
RDW: 16.1 % — AB (ref 11.5–15.5)
WBC: 8.7 10*3/uL (ref 4.0–10.5)
WBC: 7.6 10*3/uL (ref 4.0–10.5)

## 2017-09-30 LAB — IRON AND TIBC
IRON: 17 ug/dL — AB (ref 45–182)
SATURATION RATIOS: 13 % — AB (ref 17.9–39.5)
TIBC: 133 ug/dL — AB (ref 250–450)
UIBC: 116 ug/dL

## 2017-09-30 LAB — HEPARIN LEVEL (UNFRACTIONATED): HEPARIN UNFRACTIONATED: 0.35 [IU]/mL (ref 0.30–0.70)

## 2017-09-30 LAB — FERRITIN: FERRITIN: 87 ng/mL (ref 24–336)

## 2017-09-30 MED ORDER — AMIODARONE HCL IN DEXTROSE 360-4.14 MG/200ML-% IV SOLN: 30.0000 mg/h | INTRAVENOUS | Status: DC

## 2017-09-30 MED ORDER — AMIODARONE HCL 200 MG PO TABS: 200.0000 mg | ORAL_TABLET | Freq: Two times a day (BID) | ORAL | Status: DC

## 2017-09-30 MED ORDER — SODIUM CHLORIDE 0.9 % IV SOLN
510.0000 mg | Freq: Once | INTRAVENOUS | Status: AC
  Administered 2017-09-30: 510 mg via INTRAVENOUS
  Filled 2017-09-30: qty 17

## 2017-09-30 MED ORDER — CHLORHEXIDINE GLUCONATE 0.12 % MT SOLN
15.0000 mL | Freq: Two times a day (BID) | OROMUCOSAL | Status: DC
  Administered 2017-09-30 – 2017-10-25 (×37): 15 mL via OROMUCOSAL
  Filled 2017-09-30 (×45): qty 15

## 2017-09-30 MED ORDER — HEPARIN SODIUM (PORCINE) 5000 UNIT/ML IJ SOLN
5000.0000 [IU] | Freq: Three times a day (TID) | INTRAMUSCULAR | Status: DC
  Administered 2017-09-30: 5000 [IU] via SUBCUTANEOUS

## 2017-09-30 MED ORDER — ORAL CARE MOUTH RINSE
15.0000 mL | Freq: Two times a day (BID) | OROMUCOSAL | Status: DC
  Administered 2017-09-30 – 2017-10-19 (×19): 15 mL via OROMUCOSAL

## 2017-09-30 MED ORDER — AMIODARONE HCL 200 MG PO TABS
200.0000 mg | ORAL_TABLET | Freq: Every day | ORAL | Status: DC
Start: 2017-09-30 — End: 2017-09-30
  Administered 2017-09-30: 200 mg via ORAL
  Filled 2017-09-30: qty 1

## 2017-09-30 MED ORDER — AMIODARONE HCL IN DEXTROSE 360-4.14 MG/200ML-% IV SOLN
30.0000 mg/h | INTRAVENOUS | Status: DC
  Administered 2017-09-30 – 2017-10-02 (×5): 30 mg/h via INTRAVENOUS
  Filled 2017-09-30 (×6): qty 200

## 2017-09-30 MED ORDER — DARBEPOETIN ALFA 150 MCG/0.3ML IJ SOSY
150.0000 ug | PREFILLED_SYRINGE | INTRAMUSCULAR | Status: DC
  Filled 2017-09-30: qty 0.3

## 2017-09-30 MED ORDER — IPRATROPIUM-ALBUTEROL 0.5-2.5 (3) MG/3ML IN SOLN
3.0000 mL | Freq: Two times a day (BID) | RESPIRATORY_TRACT | Status: DC
  Administered 2017-09-30 – 2017-10-16 (×31): 3 mL via RESPIRATORY_TRACT
  Filled 2017-09-30 (×29): qty 3

## 2017-09-30 NOTE — Progress Notes (Signed)
Admit: 09/15/2017 LOS: 15  9M with anuric AKI (BL SCr 1.6 - 1.7) on CRRT  Subjective:  HD yesterday, 1.5L UF Mnimal UOP On [place]  02/22 0701 - 02/23 0700 In: 1024.6 [I.V.:924.6; IV Piggyback:100] Out: 2409 [Urine:150]  Filed Weights   09/29/17 1106 09/29/17 1444 09/30/17 0500  Weight: 83.7 kg (184 lb 8.4 oz) 82.2 kg (181 lb 3.5 oz) 81.9 kg (180 lb 8.9 oz)    Scheduled Meds: . atorvastatin  80 mg Oral Daily  . Chlorhexidine Gluconate Cloth  6 each Topical Daily  . darbepoetin (ARANESP) injection - DIALYSIS  60 mcg Intravenous Q Tue-HD  . dextrose  0.5 ampule Intravenous Once  . docusate sodium  100 mg Oral BID  . fludrocortisone  0.1 mg Oral Daily  . hydrocerin   Topical BID  . insulin aspart  0-20 Units Subcutaneous Q4H  . insulin glargine  15 Units Subcutaneous QHS  . ipratropium-albuterol  3 mL Nebulization TID  . mouth rinse  15 mL Mouth Rinse BID  . pantoprazole  40 mg Oral Daily  . senna  1 tablet Oral BID  . senna-docusate  1 tablet Oral QHS  . sodium chloride flush  10-40 mL Intracatheter Q12H  . sodium chloride flush  3 mL Intravenous Q12H   Continuous Infusions: . sodium chloride 10 mL/hr at 09/30/17 0600  . amiodarone 30 mg/hr (09/30/17 0600)  . heparin 1,350 Units/hr (09/30/17 0600)   PRN Meds:.acetaminophen, bisacodyl, camphor-menthol, nitroGLYCERIN, ondansetron (ZOFRAN) IV, polyvinyl alcohol, RESOURCE THICKENUP CLEAR, sodium chloride flush, sodium chloride flush  Current Labs: reviewed  Results for Joseph, [name] (MRN [id number]) as of 09/27/2017 08:49  Ref. Range 09/26/2017 09:45  Saturation Ratios Latest Ref Range: 17.9 - 39.5 % 52 (H)  Ferritin Latest Ref Range: 24 - 336 ng/mL 816 (H)    Physical Exam:  Blood pressure (!) 159/67, pulse 61, temperature (!) 97.2 F (36.2 C), temperature source Axillary, resp. rate (!) 23, height 5' (1.524 m), weight 81.9 kg (180 lb 8.9 oz), SpO2 99 %. Intubated, awakens to voice RRR, normal S1, normal S2 Coarse breath  sounds bilaterally Trace edema bilaterally Right transmetatarsal amputation, old Endotracheal tube in place Right IJ temporary HD catheter bandaged  A 1. 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 AFib with RVR; bradycardia was with temp pacer; likely to need PPM when improved 3. Influenza A with VDRF 4. Septic Shock improving off pressors 5. ASCVD hx/o MI, PAD and CVA (hx/o CEA and CABG) 6. Anemia, not Fe deficient; on ESA 7. DM2 8. Has Temp HD cathteter  P 1. Next HD 2/25 tentative 2. See how UOP goes today; if needed will touch base with [facility] tomorrow to discuss [facility] +/- AVF or AVG  [name] MD 09/30/2017, 7:15 AM  Recent Labs  Lab 09/27/17 1700  09/29/17 0304 09/29/17 1100 09/30/17 0401  NA  --    < > 136 133* 137  K  --    < > 3.2* 4.2 3.5  CL  --    < > 97* 96* 98*  CO2  --    < > 26 24 27   GLUCOSE  --    < > 135* 134* 97  BUN  --    < > 74* 75* 34*  CREATININE  --    < > 3.11* 3.26* 2.26*  CALCIUM  --    < > 8.3* 8.2* 8.3*  PHOS 1.4*  --  2.6 2.5  --    < > =  values in this interval not displayed.   Recent Labs  Lab 09/28/17 1516 09/29/17 0304 09/30/17 0401  WBC 14.6* 12.4* 8.7  HGB 7.7* 7.2* 7.1*  HCT 25.2* 22.7* 22.5*  MCV 84.3 83.8 83.6  PLT 141* 120* 100*

## 2017-09-30 NOTE — Discharge Summary (Signed)
Name: [name] MRN: [id number] DOB: 07-18-1956 62 y.o. PCP: Janifer Adie, MD  Date of Admission: 09/15/2017  8:56 AM Date of Discharge: 10/26/2017 Attending Physician: Lucious Groves, DO  Discharge Diagnosis: 1. Tachy-Brady Syndrome 2. Atrial Fibriallation 3. Acute on Chronic Renal Failure 4. Influenza A 5. Pneumonia 6. Anemia 7. Urinary Retention  Discharge Medications: Allergies as of 10/26/2017      Reactions   Penicillins Hives, Nausea And Vomiting, Swelling, Other (See Comments)   Tolerated Cefepime Has patient had a PCN reaction causing immediate rash, facial/tongue/throat swelling, SOB or lightheadedness with hypotension: YES Has patient had a PCN reaction causing severe rash involving mucus membranes or skin necrosis: No Has patient had a PCN reaction that required hospitalization No Has patient had a PCN reaction occurring within the last 10 years: No If all of the above answers are "NO", then may proceed with Cephalosporin use.      Medication List    STOP taking these medications   amLODipine 5 MG tablet Commonly known as:  NORVASC   aspirin EC 81 MG tablet   bisoprolol 5 MG tablet Commonly known as:  ZEBETA   clonazePAM 0.5 MG tablet Commonly known as:  KLONOPIN   gabapentin 600 MG tablet Commonly known as:  NEURONTIN   Insulin Glargine 100 UNIT/ML Solostar Pen Commonly known as:  LANTUS Replaced by:  insulin glargine 100 UNIT/ML injection   metFORMIN 1000 MG tablet Commonly known as:  GLUCOPHAGE   metolazone 2.5 MG tablet Commonly known as:  ZAROXOLYN   NORCO 5-325 MG tablet Generic drug:  HYDROcodone-acetaminophen   OXYGEN   Rivaroxaban 15 MG Tabs tablet Commonly known as:  XARELTO   rosuvastatin 40 MG tablet Commonly known as:  CRESTOR     TAKE these medications   acetaminophen 325 MG tablet Commonly known as:  TYLENOL Take 650 mg by mouth 3 (three) times daily as needed for mild pain.   amiodarone 200 MG  tablet Commonly known as:  PACERONE Take 1 tablet (200 mg total) by mouth 2 (two) times daily.   atorvastatin 80 MG tablet Commonly known as:  LIPITOR Take 1 tablet (80 mg total) by mouth daily.   calcium carbonate 500 MG chewable tablet Commonly known as:  TUMS - dosed in mg elemental calcium Chew 1 tablet by mouth 3 (three) times daily as needed for indigestion or heartburn.   Darbepoetin Alfa 150 MCG/0.3ML Sosy injection Commonly known as:  ARANESP Inject 0.3 mLs (150 mcg total) into the skin every Wednesday at 6 PM. Start taking on:  11/01/2017   eucerin cream Apply 1 application topically 2 (two) times daily. TO AFFECTED AREAS   feeding supplement (NEPRO CARB STEADY) Liqd Take 237 mLs by mouth 2 (two) times daily between meals.   furosemide 80 MG tablet Commonly known as:  LASIX Take 1 tablet (80 mg total) by mouth 2 (two) times daily as needed for fluid. What changed:    how much to take  when to take this  reasons to take this   insulin aspart 100 UNIT/ML injection Commonly known as:  novoLOG Inject 0-15 Units into the skin 3 (three) times daily with meals.   insulin glargine 100 UNIT/ML injection Commonly known as:  LANTUS Inject 0.14 mLs (14 Units total) into the skin at bedtime. Replaces:  Insulin Glargine 100 UNIT/ML Solostar Pen   ipratropium-albuterol 0.5-2.5 (3) MG/3ML Soln Commonly known as:  DUONEB Take 3 mLs by nebulization 2 (two) times daily.  multivitamin Tabs tablet Take 1 tablet by mouth at bedtime.   nitroGLYCERIN 0.4 MG SL tablet Commonly known as:  NITROSTAT Place 0.4 mg under the tongue every 5 (five) minutes as needed for chest pain.   pantoprazole 40 MG tablet Commonly known as:  PROTONIX Take 40 mg by mouth daily.   PARoxetine 40 MG tablet Commonly known as:  PAXIL Take 40 mg by mouth daily.   polyvinyl alcohol 1.4 % ophthalmic solution Commonly known as:  LIQUIFILM TEARS Place 1 drop into both eyes 2 (two) times daily as  needed for dry eyes. Natural Tears   RESOURCE THICKENUP CLEAR Powd Oral as needed   senna-docusate 8.6-50 MG tablet Commonly known as:  Senokot-S Take 1 tablet by mouth at bedtime as needed for mild constipation.   Vitamin D3 1000 units Caps Take 1,000 Units by mouth daily.      Disposition and follow-up:   [name] was discharged from [facility] in [place] condition.  At the hospital follow up visit please address:  1. At hospital follow up please address the following: Tachy-Brady Syndrome and paroxysmal A-Fib - Ensure follow up with electrophysiology - had plans for permanent pacemaker placement - Evaluate for any further episodes of symptomatic bradycardia - Avoid beta blockers given history of sinus pauses - Started on warfarin while inpatient; continue following INR for warfarin dosing  Warfarin INR 3/21 was 3.62. Please hold warfarin on 3/21 and re-check INR 3/22. Resume warfarin and re-dose based on tomorrow's INR level. Dose has been difficult to nail down due to interactions with amiodarone.  Acute on Chronic Renal Failure, Hyperkalemia, Hyponatremia - Continue following daily labs for electrolytes and renal function to determine need for dialysis or continued diuresis - Consider PO lasix 80mg  BID PRN as clinically needed - Continue 1500cc fluid restriction - Ensure follow up with Nephrology - Patient dislikes Veltessa and kayexalate due to diarrhea  Anemia - Follow up CBC to ensure Hgb remains stable  Urinary retention - Failed trial of void 3/13. Discharged with foley, which was replaced 3/14. - Ensure follow up with Urology  2.  Labs / imaging needed at time of follow-up: daily BMP; CBC; INR on 3/22 for warfarin dosing  3.  Pending labs/ test needing follow-up: None  Follow-up Appointments: Follow-up Information    Ardis Hughs, MD. Schedule an appointment as soon as possible for a visit in 2 month(s).   Specialty:   Urology Why:  To discuss circumcision. Contact information: [place] [address] [phone]        Evans Lance, MD Follow up on 11/03/2017.   Specialty:  Cardiology Why:  at 9:30AM Contact information: [place]. [place] [address] [phone]        Janifer Adie, MD Follow up.   Specialty:  Family Medicine Contact information: [place] [address] [place] Hospital Course by problem list:   1. Tachy-Brady Syndrome and paroxysmal Atrial Fibrillation with RVR Patient presented with Generalized weakness and was found to be in A. Fib with RVR and having intermittent 6-10 second sinus pauses. On day of admission he experienced a symptomatic 9 second pause and lost consciousness for several seconds. The patient was flu A positive which was believed to be inciting event for this episodes in addition to his significant medical history. Metoprolol was held, and electrophysiology was consulted. EP placed temporary  transvenous pacing wires, patient was transferred to the CCU and placed on amiodarone drip with plans to implant a permanent pacemaker (PPM). Implantation of PPM was delayed due to patient deterioration into to septic shock and worsening respiratory failure requiring pressors, intubation, and mechanical ventilation. Patient was transferred to critical care team and remained on their service from 2/12 until 2/22 when patient was able to be extubated. During that time the patient required intermittent boluses of amiodarone for break through RVR. Upon transfer back to the medicine service patient had remain in NSR on amiodarone drip and was transitioned to oral amiodarone, 200 mg. He had a brief, self-limited episode of RVR on the oral amiodarone. He did not have pauses or significant bradycardia after leaving the ICU and electrophysiology decided against permanent pacemaker implantation during this  admission and will be follow up with patient as an outpatient. The patient was started on warfarin during hospitalization.  2. Acute Respiratory Failure with Sepsis, 2/2 Influenza and Pneumonia Patient's respiratory status began to deteriorate on 2/10. Cause was multifactorial as patient presented Flu A positive, with Tachy-Brady syndrome and had evidence of increasing infiltrate on CXR (believed to be aspiration pneumonia due intermittent episodes of symptomatic cardiac pauses including loss of consciousness). Overnight 2/10, the patient spiked a fever of 103, and developed leukocytosis to 14.9. Patient was started on broad spectrum antibiotics and had been receiving Tamiflu. Patient became increasingly hypotensive eventually requiring pressor support and was transferred to the critical care team on 2/12. His respiratory status continued to deteriorate and he required intubation on 2/12 as well. He completed a course of Tamiflu, Linezolid, and Cefepime while in the ICU. He was weaned off pressors and was transferred back to medicine service on 2/22 following successful extubation. His respiratory status slowly improved. He received nebulizers, chest physiotherapy, and pulmonary hygiene treatment. He initially required 3L [place] but was weaned to room air after leaving the ICU. Repeat CXR prior to discharge on 10/10/2017 did not show evidence of pneumonia (although did show pleural effusions).  3. Acute on Chronic Renal Failure Patient presented with Cr 1.6 (suspected to be his new baseline). While admitted, Cr peak at 4.6 in the ICU, with oliguria. Patient required placement of dialysis catheter and CRRT in ICU. He was transitioned to intermittent HD. On transfer out of the ICU patient remained oliguric and a tunneled HD catheter was placed. Over the next few days patient's urine output began to improve while on IV diuresis, though he continued to required Intermittent HD for volume overload (last sessions 3/4  and 3/12). His kidney function continued to slowly improve, with Cr 3.08 on discharge.  4. Hyperkalemia, hyponatremia 2/2 renal failure He required veltessa and insulin on several occasions for hyperkalemia. His hyponatremia improved with fluid restriction and diuresis. Please continue management of hyperkalemia and hyponatremia as needed.  5. Anemia Patient presented with chronic anemia and received ESA while admitted. Iron studies showed Fe 17, Ferritin. Patient received Feraheme 510mg  IV (he has a history of serial Feraheme infusions in the past and may need to resume this on discharge). Hgb trended down slowly during admission from ~10 to 6.8. Patient received 1 Unit PRBC's and Hgb improved to 8.3 and remained stable. Patient has a history of chronic anemia and has required serial Feraheme transfusions in the past. There was some consideration given to GI blood loss, though patient history only showed small AVMs in the past and Hgb stabilized following transfusion for anemia in Jan 2018. Hb stable at  9.8 on discharge.  6. Urinary retention Patient required foley placement while in ICU. Trial of void was attempted on 3/13, which was unsuccessful likely secondary to patient's severe deconditioning. Foley was replaced 3/14 and he will need Urology f/u on discharge.  7. Sacral pressure ulcer, stage 2-3 Nursing staff assisted with keeping area clean and dry.  8. FEN/GI Was on dysphagia 3 diet, as well as pantoprazole 40mg  daily  Discharge Vitals:   BP (!) 124/48 (BP Location: Right Arm)   Pulse (!) 56   Temp 97.9 F (36.6 C) (Oral)   Resp 20   Ht 5' (1.524 m)   Wt 164 lb 14.5 oz (74.8 kg)   SpO2 98%   BMI 32.21 kg/m   Pertinent Labs, Studies, and Procedures:  CBC Latest Ref Rng & Units 10/25/2017 10/22/2017 10/19/2017  WBC 4.0 - 10.5 K/uL 5.2 5.0 7.6  Hemoglobin 13.0 - 17.0 g/dL 9.8(L) 10.1(L) 10.5(L)  Hematocrit 39.0 - 52.0 % 31.7(L) 32.2(L) 33.2(L)  Platelets 150 - 400 K/uL 237 269  289   CMP Latest Ref Rng & Units 10/26/2017 10/25/2017 10/24/2017  Glucose 65 - 99 mg/dL 112(H) 93 115(H)  BUN 6 - 20 mg/dL 52(H) 52(H) 55(H)  Creatinine 0.61 - 1.24 mg/dL 3.08(H) 3.13(H) 3.32(H)  Sodium 135 - 145 mmol/L 131(L) 128(L) 126(L)  Potassium 3.5 - 5.1 mmol/L 4.9 5.4(H) 5.2(H)  Chloride 101 - 111 mmol/L 94(L) 91(L) 89(L)  CO2 22 - 32 mmol/L 27 26 24   Calcium 8.9 - 10.3 mg/dL 8.6(L) 8.9 8.7(L)  Total Protein 6.5 - 8.1 g/dL - - -  Total Bilirubin 0.3 - 1.2 mg/dL - - -  Alkaline Phos 38 - 126 U/L - - -  AST 15 - 41 U/L - - -  ALT 17 - 63 U/L - - -   Hgb A1c 09/28/17: 7.9 INR 10/26/17: 3.62  Echocardiogram 09/18/17: Study Conclusions - Left ventricle: The cavity size was normal. Wall thickness was increased in a pattern of mild LVH. Indeterminant diastolic function, atrial fibrillation. Systolic function was mildly to moderately reduced. The estimated ejection fraction was in the range of 40% to 45%. Diffuse hypokinesis. - Aortic valve: There was no stenosis. - Mitral valve: Moderately calcified annulus. There was no significant regurgitation. - Left atrium: The atrium was mildly dilated. - Right ventricle: The cavity size was mildly to moderately dilated. Pacer wire or catheter noted in right ventricle. Systolic function was mildly reduced. - Right atrium: The atrium was mildly dilated. - Tricuspid valve: Peak RV-RA gradient (S): 32 mm Hg. - Pulmonary arteries: PA peak pressure: 35 mm Hg (S). - Inferior vena cava: The vessel was normal in size. The respirophasic diameter changes were in the normal range (>= 50%), consistent with normal central venous pressure. Impressions: - The patient was in atrial fibrillation. Normal LV size with mild LV hypertrophy. EF 40-45%. Mild-moderate RV dilation with mildly decreased systolic function. Borderline pulmonary hypertension.  CT Head 09/18/17: IMPRESSION: 1. Progressive severe chronic paranasal sinusitis, most prominent on the right.  Apparent lytic destructive changes in the walls of the right ethmoidal air cells with worsening left bowing of the nasal septum. Findings could be due to aggressive sinusitis, with underlying neoplasm not excluded. ENT consultation advised. 2. No evidence of acute intracranial abnormality. 3. 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 4. Mild chronic small vessel ischemic changes in the cerebral white matter.  CXR 09/17/2017 Increased left lung base opacity may represent  atelectasis or pneumonia. Possible small left effusion.  CXR 10/15/2017 CHF pattern, slightly improved since prior study. Continued small right pleural effusion.  Discharge Instructions: Discharge Instructions    Diet - low sodium heart healthy   Complete by:  As directed    Discharge instructions   Complete by:  As directed    [name],  We took care of your heart, lungs, and kidneys while you were here. We are still waiting on your kidneys to recover. We are hopeful that they will continue to get better. Please continue following with your primary doctors at [facility].   Increase activity slowly   Complete by:  As directed      Signed: Colbert Ewing, MD 10/26/2017, 1:22 PM   Pager: [phone]

## 2017-09-30 NOTE — Plan of Care (Signed)
  Progressing Clinical Measurements: Respiratory complications will improve 09/30/2017 2046 - Progressing by Levonne Hubert, RN Cardiovascular complication will be avoided 09/30/2017 2046 - Progressing by Levonne Hubert, RN Note Amiodarone changed to PO today, NSR HR 60s. Nutrition: Adequate nutrition will be maintained 09/30/2017 2046 - Progressing by Levonne Hubert, RN Note Appetite fair  Skin Integrity: Risk for impaired skin integrity will decrease 09/30/2017 2046 - Progressing by Levonne Hubert, RN Note DTI sacrum, turns q2hrs for off-loading   Not Progressing Education: Knowledge of disease or condition will improve 09/30/2017 2046 - Not Progressing by Levonne Hubert, RN Note Increased confusion

## 2017-09-30 NOTE — Progress Notes (Signed)
Patient converted to a fib RVR, rates 100s-140, sustaining 110s. Cardiology and IMTS were paged, verbal orders from Dr. Rhae Hammock to restart amiodarone gtt at 30 mg/hr.

## 2017-09-30 NOTE — Significant Event (Signed)
I was notified by the patient's nurse that he reverted from NSR to [place] with HR up to the 140s. He was switched from IV amiodarone infusion to oral amiodarone today. At this time, will switch back to the amiodarone infusion at his previous rate. We will continue to monitor his HR and BP.

## 2017-09-30 NOTE — Progress Notes (Signed)
   Transfer note.   Subjective: 62 year old gentleman with history of CAD s/p CABG, HFrEF (40-45%), admitted on 09/15/17 with generalized weakness and found to be in A. fib with RVR with intermittent 8-9-second pauses.  A temporary pacemaker was placed on 2/8 and removed on 09/25/17.  A. fib with RVR was most of the time treated with amiodarone infusion-today it was converted to p.o.  Patient was found to be flu positive and developed influenza pneumonia.  Later he was intubated from 09/19/17-09/28/17, due to worsening respiratory status and septic shock requiring pressors.   He also developed AK I on his CKD requiring CRRT initially, recently getting HD.  He remained oliguric.   He was transferred out of ICU today.  Patient remained little confused, although not appeared in respiratory distress currently.  He wants to get out of bed and regain his strength. According to nursing staff he appears little less alert and more somnolent today.  Objective:  Vital signs in last 24 hours: Vitals:   09/30/17 0900 09/30/17 1000 09/30/17 1100 09/30/17 1205  BP: (!) 126/54 (!) 143/60 (!) 153/62   Pulse: 65 63 64   Resp: 17 (!) 26 20   Temp:    98.2 F (36.8 C)  TempSrc:    Oral  SpO2: 91% 99% 99%   Weight:      Height:       General.  Well-developed gentleman, oriented to self only, in no acute distress. Lungs.  Coarse breath sounds with few scattered rhonchi. CV.  Regular rate and rhythm. Abdomen.  Soft, nontender, bowel sounds positive. Extremities. Rt. Fit amputation,No edema.   Assessment/Plan:   Tachy-Brady Syndrome.  Waiting for permanent pacemaker placement.  Currently in sinus rhythm -Avoid metoprolol to prevent any bradycardia. -EP following-appreciate any recommendations.  PAF with RVR.  Currently in sinus rhythm, IV amiodarone was converted to p.o. 200 mg twice daily today. -He is not a candidate for long-term anticoagulation due to prior GI bleed on Pradaxa.  Currently on  heparin gtt. -Following-appreciate their recommendations.  Influenza Pneumonia/respiratory failure. Patient is severely deconditioned after prolonged intubation.  Still requiring 3-4 L of oxygen.  Completed antibiotics course. -Continue DuoNeb. -Continue monitoring respiratory status. -PT/OT evaluation. -Continue incentive spirometry and flutter valve.  AKI.  Patient remained oliguric.  Nephrology is following-most likely will need a tunneled catheter if urinary output does not improve.  He had his dialysis yesterday, next dialysis on October 02, 2017.  Creatinine today was 2.26.  Anemia.  Hemoglobin continued to drop, it was 7.1 today.  Patient is on heparin gtt, history of GI bleed with Pradaxa the past. -Check FOBT. -Iron and ferritin. -Getting Aranesp with nephrology.  HFrEF.  Current EF 40-45%.  No sign of acute exacerbation.  CAD. Severe 3 vessel by cath 04/2015 and CABG 2017.  Denies any chest pain. -Continue Lipitor. -No beta-blocker due to bradycardic episodes.  Dysphagia.  Continue dysphagia 2 diet with honey thick liquid.  DM.  Current A1c on 09/28/17 was 7.9.  CBG remained between 80-130. -Continue Lantus 15 units at bedtime with SSI.  Adrenal insufficiency.  Steroid was discontinued yesterday. -Continue fludrocortisone.   Dispo: Anticipated discharge in approximately 2-3 day(s).   Lorella Nimrod, MD 09/30/2017, 12:22 PM Pager: [phone]

## 2017-09-30 NOTE — Progress Notes (Signed)
PT Cancellation Note  Patient Details Name: [name] MRN: [id number] DOB: Nov 23, 1955   Cancelled Treatment:    Reason Eval/Treat Not Completed: Patient not medically ready;Active bedrest order. Pt remains with "strict bedrest" orders in place. PT Joseph continue to follow and await updated activity orders prior to initiating evaluation.   [place] 09/30/2017, 9:53 AM

## 2017-09-30 NOTE — Progress Notes (Signed)
  Date: 09/30/2017  Patient name: [name] record number: [id number]  Date of birth: 05-Sep-1955   I have seen and evaluated this patient and I have discussed the plan of care with the house staff. Please see their note for complete details. I concur with their findings with the following additions/corrections:   [name] is a 61 year old man with a history of CAD status post three-vessel CABG 2 years ago, HFrEF (EF 40-45%), cognitive impairment, and A. fib who was admitted to the ICU for respiratory failure due to influenza. He required intubation and pressors for shock, but has now been extubated and is off pressors. He also developed acute oliguric renal failure requiring CRRT, now transitioned to HD. During this admission he has had tachybradycardia syndrome with paroxysmal atrial fibrillation with RVR as well as bradycardia with sinus pauses. He had a temporary pacemaker from 2/8 - 2/18. He is currently on amiodarone by mouth 200 mg twice a day after being transitioned from an amiodarone drip. The plan is for permanent pacemaker to be placed after this acute illness.  On exam today, he was alert and interactive, though not completely oriented to time and place. He was breathing comfortably with nasal cannula in place, but had coarse rhonchi. He remains oliguric, with only about 20 mL of urine thus far today.  We will continue supportive care, HD for his acute oliguric renal failure (in the context of CKD stage III), continue pulmonary toilet and wean off of oxygen, and monitor on telemetry for tachybradycardia syndrome. He has on IV heparin for atrial fibrillation, but per cardiology he has not been on long-term anticoagulation due to prior GI bleeding. 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 His for Tis score remains low risk, but should he develop any signs of thrombosis, we will need to consider this diagnosis more  seriously.  Lenice Pressman, M.D., Ph.D. 09/30/2017, 2:19 PM

## 2017-09-30 NOTE — Plan of Care (Signed)
Pt's mental status seems slightly worse than yesterday, more drowsy, oriented to self and place after reorientation. Diminished UO.  Bed in low position, alarms are on, call bell and phone in reach, Frequent turns and oral care provided.

## 2017-09-30 NOTE — Progress Notes (Signed)
Progress Note  Patient Name: [name] Date of Encounter: 09/30/2017  Primary Cardiologist: Mertie Moores, MD   Subjective   No complaints of CP or SOB  Inpatient Medications    Scheduled Meds: . atorvastatin  80 mg Oral Daily  . chlorhexidine  15 mL Mouth Rinse BID  . Chlorhexidine Gluconate Cloth  6 each Topical Daily  . [START ON 10/03/2017] darbepoetin (ARANESP) injection - DIALYSIS  150 mcg Intravenous Q Tue-HD  . dextrose  0.5 ampule Intravenous Once  . docusate sodium  100 mg Oral BID  . fludrocortisone  0.1 mg Oral Daily  . hydrocerin   Topical BID  . insulin aspart  0-20 Units Subcutaneous Q4H  . insulin glargine  15 Units Subcutaneous QHS  . ipratropium-albuterol  3 mL Nebulization TID  . mouth rinse  15 mL Mouth Rinse BID  . mouth rinse  15 mL Mouth Rinse q12n4p  . pantoprazole  40 mg Oral Daily  . senna  1 tablet Oral BID  . senna-docusate  1 tablet Oral QHS  . sodium chloride flush  10-40 mL Intracatheter Q12H  . sodium chloride flush  3 mL Intravenous Q12H   Continuous Infusions: . sodium chloride 10 mL/hr at 09/30/17 0800  . amiodarone 30 mg/hr (09/30/17 0800)  . heparin 1,350 Units/hr (09/30/17 0800)   PRN Meds: acetaminophen, bisacodyl, camphor-menthol, nitroGLYCERIN, ondansetron (ZOFRAN) IV, polyvinyl alcohol, RESOURCE THICKENUP CLEAR, sodium chloride flush, sodium chloride flush   Vital Signs    Vitals:   09/30/17 0600 09/30/17 0740 09/30/17 0800 09/30/17 0900  BP: (!) 159/67  (!) 149/57   Pulse: 61  63 65  Resp: (!) 23  (!) 21 17  Temp:  97.7 F (36.5 C)    TempSrc:  Oral    SpO2: 99%  98% 91%  Weight:      Height:        Intake/Output Summary (Last 24 hours) at 09/30/2017 0931 Last data filed at 09/30/2017 0800 Gross per 24 hour  Intake 974.6 ml  Output 1630 ml  Net -655.4 ml   Filed Weights   09/29/17 1106 09/29/17 1444 09/30/17 0500  Weight: 184 lb 8.4 oz (83.7 kg) 181 lb 3.5 oz (82.2 kg) 180 lb 8.9 oz (81.9 kg)     Telemetry    NSR - Personally Reviewed  ECG    No new EKG to review - Personally Reviewed  Physical Exam   GEN: No acute distress.   Neck: No JVD Cardiac: RRR, no murmurs, rubs, or gallops.  Respiratory:scattered rhonchi bilaterally GI: Soft, nontender, non-distended  MS: No edema; No deformity. Neuro:  Nonfocal  Psych: Normal affect   Labs    Chemistry Recent Labs  Lab 09/24/17 0402  09/27/17 0328  09/29/17 0304 09/29/17 1100 09/30/17 0401  NA 139  140   < > 139   < > 136 133* 137  K 3.7  3.7   < > 3.7   < > 3.2* 4.2 3.5  CL 105  106   < > 104   < > 97* 96* 98*  CO2 21*  21*   < > 23   < > _0 GLUCOSE 137*  137*   < > 345*   < > 135* 134* 97  BUN 21*  21*   < > 65*   < > 74* 75* 34*  CREATININE 1.68*  1.58*   < > 3.06*   < > 3.11* 3.26* 2.26*  CALCIUM 8.2*  8.2*   < > 8.7*   < > 8.3* 8.2* 8.3*  PROT 5.9*  --   --   --   --   --  4.9*  ALBUMIN 1.8*  1.8*   < > 2.1*  --   --  2.1* 2.3*  AST 22  --   --   --   --   --  14*  ALT 12*  --   --   --   --   --  11*  ALKPHOS 47  --   --   --   --   --  36*  BILITOT 0.8  --   --   --   --   --  0.5  GFRNONAA 42*  45*   < > 20*   < > 20* 19* 29*  GFRAA 48*  52*   < > 23*   < > 23* 22* 34*  ANIONGAP 13  13   < > 12   < > _0 < > = values in this interval not displayed.     Hematology Recent Labs  Lab 09/28/17 1516 09/29/17 0304 09/30/17 0401  WBC 14.6* 12.4* 8.7  RBC 2.99* 2.71* 2.69*  HGB 7.7* 7.2* 7.1*  HCT 25.2* 22.7* 22.5*  MCV 84.3 83.8 83.6  MCH 25.8* 26.6 26.4  MCHC 30.6 31.7 31.6  RDW 16.0* 15.8* 15.8*  PLT 141* 120* 100*    Cardiac EnzymesNo results for input(s): TROPONINI in the last 168 hours. No results for input(s): TROPIPOC in the last 168 hours.   BNPNo results for input(s): BNP, PROBNP in the last 168 hours.   DDimer No results for input(s): DDIMER in the last 168 hours.   Radiology    Dg Chest Port 1 View  Result Date: 09/30/2017 CLINICAL DATA:   Respiratory failure. EXAM: PORTABLE CHEST 1 VIEW COMPARISON:  Chest x-ray from yesterday. FINDINGS: Interval removal of the left internal jugular central venous catheter. Unchanged right internal jugular central venous catheter. Stable mild cardiomegaly status post CABG. Mild pulmonary vascular congestion and interstitial edema, also unchanged. Slightly improved bibasilar atelectasis. Possible small right pleural effusion. No pneumothorax or consolidation. No acute osseous abnormality. IMPRESSION: 1. Stable vascular congestion and interstitial pulmonary edema. 2. Possible small right pleural effusion. 3. Slightly improved bibasilar atelectasis. Electronically Signed   By: Titus Dubin M.D.   On: 09/30/2017 08:38   Dg Chest Port 1 View  Result Date: 09/29/2017 CLINICAL DATA:  Acute respiratory failure EXAM: PORTABLE CHEST 1 VIEW COMPARISON:  09/27/2017 FINDINGS: Prior CABG. Interval extubation and removal of NG tube. Heart is mildly enlarged. Vascular congestion and interstitial prominence may reflect interstitial edema, slightly increased. Bibasilar atelectasis. No pneumothorax. IMPRESSION: Increasing interstitial prominence, likely interstitial edema. Increasing bibasilar atelectasis. Electronically Signed   By: Rolm Baptise M.D.   On: 09/29/2017 07:38   Dg Swallowing Func-speech Pathology  Result Date: 09/29/2017 Objective Swallowing Evaluation: Type of Study: MBS-Modified Barium Swallow Study  Patient Details Name: [name] MRN: [id number] Date of Birth: 1955-09-22 Today's Date: 09/29/2017 Time: SLP Start Time (ACUTE ONLY): 1038 -SLP Stop Time (ACUTE ONLY): 1055 SLP Time Calculation (min) (ACUTE ONLY): 17 min Past Medical History: Past Medical History: Diagnosis Date . Abdominal hernia   Chronic, not a good surgical candidate . Abscess, abdomen 12/31/2010  Referred to Wound Care in 01/2011 because of multiple abd abscess with VERY large ventral hernia (please look at image of CT abd/pelvis 09/2010).   Because  of hernia I was hesitant to I&D.     Marland Kitchen Anemia   History of Iron Def Anemia . Anxiety  . AVM (arteriovenous malformation)   chronic GI blood loss . Carotid artery disease ([place])   s/p CEA . Chronic diastolic CHF (congestive heart failure) (HCC)   takes Lasix . Chronic low back pain  . COPD (chronic obstructive pulmonary disease) ([place])  . CVA (cerebral infarction) 09/2010  Bilateral with Left > Right . Diabetes mellitus  . GERD (gastroesophageal reflux disease)  . History of nuclear stress test   Myoview 9/16:  Inferior, apical and inf-lateral ischemia; not gated; High Risk . Hx of echocardiogram   Echo 5/16:  Mild LVH, EF 55%, indeterm. diast function, WMA could not be ruled out, MAC, trivial MR, mild LAE, normal RVF //  b. Echo 4/17: EF 55-60%, no RWMA, Gr 2 DD, Ao sclerosis, MAC, mild MS, mild LAE, PASP 55 mHg . Hyperlipidemia  . Hypertension  . Intellectual disability   Sister helps to take care of him and takes him to appts . Itching   all over body; pt scratches and has sores on bilateral arms and abdomen . Lung nodule  . Myocardial infarction [facility]) 2016 ?  Heart attack  (  Per  pt. ) . Obesity  . Oxygen dependent   wears 2 liters at bedtime and when needed . PAF (paroxysmal atrial fibrillation) ([place]) 06/2009  CHADS score 2 (HTN, DM) // Pradaxa for Afib // Pradaxa stopped due to worsening anemia (Hgb in 5/18 8.5 >> Pradaxa remains on hold)  . Pneumonia   hx of . Stroke ([place])  . Tobacco user   Smokes 1ppd for multiple years.  Quit after hosp 09/2010. . Tubular adenoma of colon  . Wears glasses  Past Surgical History: Past Surgical History: Procedure Laterality Date . AMPUTATION Right 03/29/2013  Procedure: AMPUTATION RAY;  Surgeon: Newt Minion, MD;  Location: [place];  Service: Orthopedics;  Laterality: Right;  Right Foot 3rd and Possible 4th Ray Amputation . AMPUTATION Right 04/23/2013  Procedure: AMPUTATION RIGHT MID-FOOT;  Surgeon: Newt Minion, MD;  Location: [place];  Service: Orthopedics;  Laterality:  Right; . arm surgery Left   as a child . CARDIAC CATHETERIZATION N/A 04/30/2015  Procedure: Left Heart Cath and Coronary Angiography;  Surgeon: Belva Crome, MD;  Location: [place] [facility];  Service: Cardiovascular;  Laterality: N/A; . Carotid arteriogram  10/2010  30% right ICA stenosis, 40% left ICA stenosis  . CAROTID ENDARTERECTOMY Left 08-13-15  CEA . CATARACT EXTRACTION, BILATERAL   . COLONOSCOPY WITH PROPOFOL N/A 06/10/2014  Procedure: COLONOSCOPY WITH PROPOFOL;  Surgeon: Jerene Bears, MD;  Location[facility];  Service: Gastroenterology;  Laterality: N/A; . CORONARY ARTERY BYPASS GRAFT N/A 08/13/2015  Procedure: CORONARY ARTERY BYPASS GRAFTING (CABG), ON PUMP, TIMES THREE, USING LEFT INTERNAL MAMMARY ARTERY, RIGHT GREATER SAPHENOUS VEIN HARVESTED ENDOSCOPICALLY;  Surgeon: [name], MD;  Location: [place];  Service: Open Heart Surgery;  Laterality: N/A; . DEBRIDMENT OF DECUBITUS ULCER Right 02/13/2013 . ENDARTERECTOMY Left 08/13/2015  Procedure: ENDARTERECTOMY CAROTID;  Surgeon: Angelia Mould, MD;  Location: [place];  Service: Vascular;  Laterality: Left; . ESOPHAGOGASTRODUODENOSCOPY (EGD) WITH PROPOFOL N/A 06/10/2014  Procedure: ESOPHAGOGASTRODUODENOSCOPY (EGD) WITH PROPOFOL;  Surgeon: Jerene Bears, MD;  [facility];  Service: Gastroenterology;  Laterality: N/A; . GIVENS CAPSULE STUDY N/A 07/09/2014  Procedure: GIVENS CAPSULE STUDY;  Surgeon: Jerene Bears, MD;  Location[facility];  Service: Gastroenterology;  Laterality: N/A; . I&D EXTREMITY Right 02/13/2013  Procedure: IRRIGATION AND DEBRIDEMENT FOOT ULCER;  Surgeon: Johnny Bridge, MD;  Location: [place];  Service: Orthopedics;  Laterality: Right;  PULSE LAVAGE . MULTIPLE TOOTH EXTRACTIONS   . TEE WITHOUT CARDIOVERSION N/A 08/13/2015  Procedure: TRANSESOPHAGEAL ECHOCARDIOGRAM (TEE);  Surgeon: [name], MD;  Location: [place];  Service: Open Heart Surgery;  Laterality: N/A; . TEMPORARY PACEMAKER N/A 09/15/2017  Procedure: TEMPORARY  PACEMAKER;  Surgeon: Jettie Booze, MD;  Location: [place] [facility];  Service: Cardiovascular;  Laterality: N/A; . TRANSESOPHAGEAL ECHOCARDIOGRAM  09/2010  No ASD or PFO. EF 60-65%.  Normal systolic function. No evidence of thrombus.  . TRANSTHORACIC ECHOCARDIOGRAM  09/2010   The cavity size was normal. Systolic function was vigorous.  EF 65-70%.  Normal wall funciton.  Marland Kitchen ULTRASOUND GUIDANCE FOR VASCULAR ACCESS  09/15/2017  Procedure: Ultrasound Guidance For Vascular Access;  Surgeon: Jettie Booze, MD;  Location: [place] [facility];  Service: Cardiovascular;; HPI: 62 yo M with History of paroxysmal A. Fib, DM, CHF, CAD, CABG 2017, carotid stenosis (S/P L Carotid Endarterectomy), GERD, CVA, COPD, HTN, Abdominal Hernia, and Diabetes presented  generalized weakness and was found to have flu and was in A. Fib with RVR. Developed AKI requiring transient CRRT. Acute hypoxemic respiratory failure with bilateral pulmonary infiltrates. Intubated 2/12-2/21. CXR 2/22 increasing interstitial prominence, likely interstitial edema. Increasing bibasilar atelectasis. BSE 08/12/16 primary esophageal dysphagia. Esophagram same day no esophageal obstruction or stricture. Presbyesophagus. penetration of barium to the level of the vocal cords occurred during the exam and did elicit intermittent coughing. Recommend followup speech pathology evaluation to evaluate for aspiration risk.  No Data Recorded Assessment / Plan / Recommendation CHL IP CLINICAL IMPRESSIONS 09/29/2017 Clinical Impression Pt exhibited mild pharyngeal dysphagia resulting in penetration with nectar and honey barium due to delayed closing of larynx. Pt penetrated with honey thick x 1 (mild) occurred during verbalization. Partial clearance of laryngeal vestibule with verbal cues for hard cough. There was min-mild vallecular residue most clearing with subsequent swallows. Mastication of soft cereal bar was functional. MBS does not diagnose below the level of  the UES however brief esohageal scan did not reveal abnormalities (pt has hx of presbyesophagus). Recommend Dys 2, honey thick liquids, full supervision and assist, intermittent cough and double swallow, crush pills and remain upright after meals. Prognosis is good for return to unrestricted liquids given increased time post extubation and increased cognitive clarity.    SLP Visit Diagnosis Dysphagia, pharyngeal phase (R13.13) Attention and concentration deficit following -- Frontal lobe and executive function deficit following -- Impact on safety and function Moderate aspiration risk   CHL IP TREATMENT RECOMMENDATION 09/29/2017 Treatment Recommendations Therapy as outlined in treatment plan below   Prognosis 09/29/2017 Prognosis for Safe Diet Advancement Good Barriers to Reach Goals -- Barriers/Prognosis Comment -- CHL IP DIET RECOMMENDATION 09/29/2017 SLP Diet Recommendations Dysphagia 2 (Fine chop) solids;Honey thick liquids Liquid Administration via Cup Medication Administration Crushed with puree Compensations Minimize environmental distractions;Slow rate;Small sips/bites;Clear throat intermittently Postural Changes Seated upright at 90 degrees   CHL IP OTHER RECOMMENDATIONS 09/29/2017 Recommended Consults -- Oral Care Recommendations Oral care BID Other Recommendations --   CHL IP FOLLOW UP RECOMMENDATIONS 09/29/2017 Follow up Recommendations Other (comment)   CHL IP FREQUENCY AND DURATION 09/29/2017 Speech Therapy Frequency (ACUTE ONLY) min 2x/week Treatment Duration 2 weeks      CHL IP ORAL PHASE 09/29/2017 Oral Phase WFL Oral - Pudding Teaspoon -- Oral - Pudding  Cup -- Oral - Honey Teaspoon -- Oral - Honey Cup -- Oral - Nectar Teaspoon -- Oral - Nectar Cup -- Oral - Nectar Straw -- Oral - Thin Teaspoon -- Oral - Thin Cup -- Oral - Thin Straw -- Oral - Puree -- Oral - Mech Soft -- Oral - Regular -- Oral - Multi-Consistency -- Oral - Pill -- Oral Phase - Comment --  CHL IP PHARYNGEAL PHASE 09/29/2017 Pharyngeal Phase  Impaired Pharyngeal- Pudding Teaspoon -- Pharyngeal -- Pharyngeal- Pudding Cup -- Pharyngeal -- Pharyngeal- Honey Teaspoon -- Pharyngeal -- Pharyngeal- Honey Cup Pharyngeal residue - valleculae;Penetration/Aspiration during swallow Pharyngeal Material enters airway, remains ABOVE vocal cords and not ejected out Pharyngeal- Nectar Teaspoon -- Pharyngeal -- Pharyngeal- Nectar Cup Reduced airway/laryngeal closure;Penetration/Aspiration during swallow Pharyngeal Material enters airway, remains ABOVE vocal cords and not ejected out Pharyngeal- Nectar Straw -- Pharyngeal -- Pharyngeal- Thin Teaspoon -- Pharyngeal -- Pharyngeal- Thin Cup -- Pharyngeal -- Pharyngeal- Thin Straw -- Pharyngeal -- Pharyngeal- Puree -- Pharyngeal -- Pharyngeal- Mechanical Soft WFL Pharyngeal -- Pharyngeal- Regular -- Pharyngeal -- Pharyngeal- Multi-consistency -- Pharyngeal -- Pharyngeal- Pill -- Pharyngeal -- Pharyngeal Comment --  CHL IP CERVICAL ESOPHAGEAL PHASE 09/29/2017 Cervical Esophageal Phase WFL Pudding Teaspoon -- Pudding Cup -- Honey Teaspoon -- Honey Cup -- Nectar Teaspoon -- Nectar Cup -- Nectar Straw -- Thin Teaspoon -- Thin Cup -- Thin Straw -- Puree -- Mechanical Soft -- Regular -- Multi-consistency -- Pill -- Cervical Esophageal Comment -- No flowsheet data found. Houston Siren 09/29/2017, 12:16 PM  Orbie Pyo Colvin Caroli.Ed CCC-SLP Pager [phone]              Cardiac Studies   Echo 09/18/17: Study Conclusions - Left ventricle: The cavity size was normal. Wall thickness was increased in a pattern of mild LVH. Indeterminant diastolic function, atrial fibrillation. Systolic function was mildly to moderately reduced. The estimated ejection fraction was in the range of 40% to 45%. Diffuse hypokinesis. - Aortic valve: There was no stenosis. - Mitral valve: Moderately calcified annulus. There was no significant regurgitation. - Left atrium: The atrium was mildly dilated. - Right ventricle: The cavity  size was mildly to moderately dilated. Pacer wire or catheter noted in right ventricle. Systolic function was mildly reduced. - Right atrium: The atrium was mildly dilated. - Tricuspid valve: Peak RV-RA gradient (S): 32 mm Hg. - Pulmonary arteries: PA peak pressure: 35 mm Hg (S). - Inferior vena cava: The vessel was normal in size. The respirophasic diameter changes were in the normal range (>= 50%), consistent with normal central venous pressure.  Impressions: - The patient was in atrial fibrillation. Normal LV size with mild LV hypertrophy. EF 40-45%. Mild-moderate RV dilation with mildly decreased systolic function. Borderline pulmonary hypertension.    Patient Profile     62 y.o. male  admitted with influenza and respiratory failure requiring intubation, shock requiring IV pressors, and atrial fib with a RVR and slow VR and pauses, s/p temporary PM.   Assessment & Plan    1. Atrial fibrillation, tachy brady syndrome - EP placed temporary pacer on 09/15/17 while waiting to place a PPM. This was removed 09/25/17.  - Recurrent PAF with RVR and now on IV Amiodarone again - he is not on longterm anticoagulation due to prior GIB on Pradaxa - he is taking PO so will change to Amio PO 258m BID - follow for bradycardia on tele - he is currently on IV Heparin gtt but Hbg slowly trending downward.  May need  to stop.  As above, he is not a long term anticoagulation candidate due to h/o GIB on NOAC  2. Ischemic Dilated Cardiomyopathy - EF 35% at time of cath 04/2015 but now 40-45% by echo this admission  3. Acute on chronic renal failure - creatinine 2.26 today.   - had HD yesterday  4. Respiratory failure - Positive for influenza on admission.  - Extubated now, slowly improving. - per TRH  5.  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 - no BB due to bradycardia episodes - no ASA due to anemia and h/p GIB  6. Mentally  handicapped    For questions or updates, please contact [place] Please consult [email or web address] for contact info under Cardiology/STEMI.      Signed, Fransico Him, MD  09/30/2017, 9:31 AM

## 2017-09-30 NOTE — Progress Notes (Signed)
Paged by triad, patient out of ICU. IMTS will resume care today.   Velna Ochs, M.D. - PGY2 Pager: [phone] 09/30/2017, 7:27 AM

## 2017-10-01 DIAGNOSIS — Z9861 Coronary angioplasty status: Secondary | ICD-10-CM

## 2017-10-01 LAB — RENAL FUNCTION PANEL
ANION GAP: 11 (ref 5–15)
Albumin: 2.2 g/dL — ABNORMAL LOW (ref 3.5–5.0)
BUN: 43 mg/dL — ABNORMAL HIGH (ref 6–20)
CO2: 27 mmol/L (ref 22–32)
Calcium: 8.4 mg/dL — ABNORMAL LOW (ref 8.9–10.3)
Chloride: 98 mmol/L — ABNORMAL LOW (ref 101–111)
Creatinine, Ser: 3.1 mg/dL — ABNORMAL HIGH (ref 0.61–1.24)
GFR calc Af Amer: 23 mL/min — ABNORMAL LOW (ref >60)
GFR calc non Af Amer: 20 mL/min — ABNORMAL LOW (ref >60)
GLUCOSE: 105 mg/dL — AB (ref 65–99)
POTASSIUM: 3.8 mmol/L (ref 3.5–5.1)
Phosphorus: 2.6 mg/dL (ref 2.5–4.6)
Sodium: 136 mmol/L (ref 135–145)

## 2017-10-01 LAB — CBC
HEMATOCRIT: 23.7 % — AB (ref 39.0–52.0)
HEMOGLOBIN: 7.2 g/dL — AB (ref 13.0–17.0)
MCH: 25.6 pg — AB (ref 26.0–34.0)
MCHC: 30.4 g/dL (ref 30.0–36.0)
MCV: 84.3 fL (ref 78.0–100.0)
Platelets: 86 10*3/uL — ABNORMAL LOW (ref 150–400)
RBC: 2.81 MIL/uL — ABNORMAL LOW (ref 4.22–5.81)
RDW: 15.9 % — ABNORMAL HIGH (ref 11.5–15.5)
WBC: 8 10*3/uL (ref 4.0–10.5)

## 2017-10-01 LAB — GLUCOSE, CAPILLARY
GLUCOSE-CAPILLARY: 104 mg/dL — AB (ref 65–99)
GLUCOSE-CAPILLARY: 91 mg/dL (ref 65–99)
GLUCOSE-CAPILLARY: 92 mg/dL (ref 65–99)
Glucose-Capillary: 115 mg/dL — ABNORMAL HIGH (ref 65–99)
Glucose-Capillary: 78 mg/dL (ref 65–99)
Glucose-Capillary: 78 mg/dL (ref 65–99)

## 2017-10-01 MED ORDER — HEPARIN SODIUM (PORCINE) 1000 UNIT/ML DIALYSIS
1000.0000 [IU] | INTRAMUSCULAR | Status: DC | PRN
  Filled 2017-10-01: qty 1

## 2017-10-01 MED ORDER — LIDOCAINE HCL (PF) 1 % IJ SOLN: 5.0000 mL | INTRAMUSCULAR | Status: DC | PRN

## 2017-10-01 MED ORDER — HEPARIN (PORCINE) IN NACL 100-0.45 UNIT/ML-% IJ SOLN
1400.0000 [IU]/h | INTRAMUSCULAR | Status: DC
  Administered 2017-10-01 (×2): 1400 [IU]/h via INTRAVENOUS
  Filled 2017-10-01: qty 250

## 2017-10-01 MED ORDER — PENTAFLUOROPROP-TETRAFLUOROETH EX AERO: 1.0000 "application " | INHALATION_SPRAY | CUTANEOUS | Status: DC | PRN

## 2017-10-01 MED ORDER — SODIUM CHLORIDE 0.9 % IV SOLN: 100.0000 mL | INTRAVENOUS | Status: DC | PRN

## 2017-10-01 MED ORDER — LIDOCAINE-PRILOCAINE 2.5-2.5 % EX CREA
1.0000 "application " | TOPICAL_CREAM | CUTANEOUS | Status: DC | PRN
  Filled 2017-10-01: qty 5

## 2017-10-01 MED ORDER — ALTEPLASE 2 MG IJ SOLR: 2.0000 mg | Freq: Once | INTRAMUSCULAR | Status: DC | PRN

## 2017-10-01 MED ORDER — HEPARIN SODIUM (PORCINE) 1000 UNIT/ML DIALYSIS
20.0000 [IU]/kg | INTRAMUSCULAR | Status: DC | PRN
  Filled 2017-10-01: qty 2

## 2017-10-01 NOTE — Progress Notes (Signed)
  Speech Language Pathology Treatment: Dysphagia  Patient Details Name: [name] MRN: [id number] DOB: September 26, 1955 Today's Date: 10/01/2017 Time: 2119-4174 SLP Time Calculation (min) (ACUTE ONLY): 15 min  Assessment / Plan / Recommendation Clinical Impression  F/u after Friday's MBS - pt pleasant, alert, asking for coffee.  Thickened to honey consistency, and he consumed two ounces with min verbal cues for rate/bolus size.  No overt s/s of aspiration; voice clear post-swallow. Lungs with crackles per chart review.  Will continue to follow for diet progression/safety.  D/W RN.   HPI HPI: 62 yo M with History of paroxysmal A. Fib, DM, CHF, CAD, CABG 2017, carotid stenosis (S/P L Carotid Endarterectomy), GERD, CVA, COPD, HTN, Abdominal Hernia, and Diabetes presented  generalized weakness and was found to have flu and was in A. Fib with RVR. Developed AKI requiring transient CRRT. Acute hypoxemic respiratory failure with bilateral pulmonary infiltrates. Intubated 2/12-2/21. CXR 2/22 increasing interstitial prominence, likely interstitial edema. Increasing bibasilar atelectasis. BSE 08/12/16 primary esophageal dysphagia. Esophagram same day no esophageal obstruction or stricture. Presbyesophagus. penetration of barium to the level of the vocal cords occurred during the exam and did elicit intermittent coughing. Recommend followup speech pathology evaluation to evaluate for aspiration risk.      SLP Plan  Continue with current plan of care       Recommendations  Diet recommendations: Dysphagia 2 (fine chop);Honey-thick liquid Liquids provided via: Teaspoon;Cup Medication Administration: Crushed with puree Supervision: Patient able to self feed;Staff to assist with self feeding Compensations: Minimize environmental distractions;Slow rate;Small sips/bites;Clear throat intermittently Postural Changes and/or Swallow Maneuvers: Seated upright 90 degrees                Oral Care  Recommendations: Oral care BID SLP Visit Diagnosis: Dysphagia, pharyngeal phase (R13.13) Plan: Continue with current plan of care       GO                [name] 10/01/2017, 3:34 PM

## 2017-10-01 NOTE — Progress Notes (Signed)
ANTICOAGULATION CONSULT NOTE - Initial Consult  Pharmacy Consult for heparin Indication: atrial fibrillation  Allergies  Allergen Reactions  . Penicillins Hives, Nausea And Vomiting, Swelling and Other (See Comments)    Tolerated Cefepime Has patient had a PCN reaction causing immediate rash, facial/tongue/throat swelling, SOB or lightheadedness with hypotension: YES Has patient had a PCN reaction causing severe rash involving mucus membranes or skin necrosis: No Has patient had a PCN reaction that required hospitalization No Has patient had a PCN reaction occurring within the last 10 years: No If all of the above answers are "NO", then may proceed with Cephalosporin use.     Patient Measurements: Height: 5' (152.4 cm) Weight: 180 lb 8.9 oz (81.9 kg) IBW/kg (Calculated) : 50 Heparin Dosing Weight: 70kg  Vital Signs: Temp: 96.3 F (35.7 C) (02/23 1941) Temp Source: Axillary (02/23 1941) BP: 105/54 (02/24 0000) Pulse Rate: 109 (02/24 0000)  Labs: Recent Labs    09/28/17 0454  09/29/17 0304 09/29/17 1100 09/30/17 0401 09/30/17 0405 09/30/17 1435  HGB  --    < > 7.2*  --  7.1*  --  7.2*  HCT  --    < > 22.7*  --  22.5*  --  23.0*  PLT  --    < > 120*  --  100*  --  91*  HEPARINUNFRC 0.29*  --  0.37  --   --  0.35  --   CREATININE  --    < > 3.11* 3.26* 2.26*  --   --    < > = values in this interval not displayed.    Estimated Creatinine Clearance: 29.7 mL/min (A) (by C-G formula based on SCr of 2.26 mg/dL (H)).   Medical History: Past Medical History:  Diagnosis Date  . Abdominal hernia    Chronic, not a good surgical candidate  . Abscess, abdomen 12/31/2010   Referred to Wound Care in 01/2011 because of multiple abd abscess with VERY large ventral hernia (please look at image of CT abd/pelvis 09/2010).  Because of hernia I was hesitant to I&D.      Marland Kitchen Anemia    History of Iron Def Anemia  . Anxiety   . AVM (arteriovenous malformation)    chronic GI blood loss   . Carotid artery disease ([place])    s/p CEA  . Chronic diastolic CHF (congestive heart failure) (H[facility])    takes Lasix  . Chronic low back pain   . COPD (chronic obstructive pulmonary disease) ([place])   . CVA (cerebral infarction) 09/2010   Bilateral with Left > Right  . Diabetes mellitus   . GERD (gastroesophageal reflux disease)   . History of nuclear stress test    Myoview 9/16:  Inferior, apical and inf-lateral ischemia; not gated; High Risk  . Hx of echocardiogram    Echo 5/16:  Mild LVH, EF 55%, indeterm. diast function, WMA could not be ruled out, MAC, trivial MR, mild LAE, normal RVF //  b. Echo 4/17: EF 55-60%, no RWMA, Gr 2 DD, Ao sclerosis, MAC, mild MS, mild LAE, PASP 55 mHg  . Hyperlipidemia   . Hypertension   . Intellectual disability    Sister helps to take care of him and takes him to appts  . Itching    all over body; pt scratches and has sores on bilateral arms and abdomen  . Lung nodule   . Myocardial infarction [facility]) 2016 ?   Heart attack  (  Per  pt. )  .  Obesity   . Oxygen dependent    wears 2 liters at bedtime and when needed  . PAF (paroxysmal atrial fibrillation) ([place]) 06/2009   CHADS score 2 (HTN, DM) // Pradaxa for Afib // Pradaxa stopped due to worsening anemia (Hgb in 5/18 8.5 >> Pradaxa remains on hold)   . Pneumonia    hx of  . Stroke ([place])   . Tobacco user    Smokes 1ppd for multiple years.  Quit after hosp 09/2010.  . Tubular adenoma of colon   . Wears glasses      Assessment: 62yo male had been on heparin gtt for Afib w/ RVR but had been taken off, now back in Afib, to resume anticoag; of note pt was on long-term anticoag in the past but was deemed to not be candidate due to GIB on Pradaxa.  Goal of Therapy:  Heparin level 0.3-0.7 units/ml Monitor platelets by anticoagulation protocol: Yes   Plan:  Was previously low-therapeutic on heparin 1350 units/hr; will start heparin 1400 units/hr and monitor heparin levels and CBC.  Wynona Neat,  PharmD, BCPS  10/01/2017,12:35 AM

## 2017-10-01 NOTE — Progress Notes (Signed)
Flutter valve given to pt, assisted patient with treatment.  Flutter valve given in place of CPT currently d/t speech therapy just finished a swallow eval w/ pt.

## 2017-10-01 NOTE — Progress Notes (Signed)
Patient arrive to the unit from ICU. Patient is alert and oriented and VS are stable. Patient belongings are in the room with pt. Patient denies any pain and he is resting comfortably in bed. Will continue to monitor patient

## 2017-10-01 NOTE — Consult Note (Signed)
Hospital Consult    Reason for Consult:  Need for hd access Referring Physician:  Joelyn Oms MRN #:  [id number]  History of Present Illness: This is a 62 y.o. male admitted with septic shock and also has tachy/brady syndrome. He is now on hd via right ij catheter and will need to have tunneled catheteer. He does not have an implanted pacer or previous upper extremity surgery. He is right hand dominant. Transferred out of icu today. Feels generally weak but not other real complaints at this time. He is on a heparin drip at this time and xarelto at home.  Past Medical History:  Diagnosis Date  . Abdominal hernia    Chronic, not a good surgical candidate  . Abscess, abdomen 12/31/2010   Referred to Wound Care in 01/2011 because of multiple abd abscess with VERY large ventral hernia (please look at image of CT abd/pelvis 09/2010).  Because of hernia I was hesitant to I&D.      Marland Kitchen Anemia    History of Iron Def Anemia  . Anxiety   . AVM (arteriovenous malformation)    chronic GI blood loss  . Carotid artery disease ([place])    s/p CEA  . Chronic diastolic CHF (congestive heart failure) (HCC)    takes Lasix  . Chronic low back pain   . COPD (chronic obstructive pulmonary disease) ([place])   . CVA (cerebral infarction) 09/2010   Bilateral with Left > Right  . Diabetes mellitus   . GERD (gastroesophageal reflux disease)   . History of nuclear stress test    Myoview 9/16:  Inferior, apical and inf-lateral ischemia; not gated; High Risk  . Hx of echocardiogram    Echo 5/16:  Mild LVH, EF 55%, indeterm. diast function, WMA could not be ruled out, MAC, trivial MR, mild LAE, normal RVF //  b. Echo 4/17: EF 55-60%, no RWMA, Gr 2 DD, Ao sclerosis, MAC, mild MS, mild LAE, PASP 55 mHg  . Hyperlipidemia   . Hypertension   . Intellectual disability    Sister helps to take care of him and takes him to appts  . Itching    all over body; pt scratches and has sores on bilateral arms and abdomen  . Lung nodule     . Myocardial infarction [facility]) 2016 ?   Heart attack  (  Per  pt. )  . Obesity   . Oxygen dependent    wears 2 liters at bedtime and when needed  . PAF (paroxysmal atrial fibrillation) ([place]) 06/2009   CHADS score 2 (HTN, DM) // Pradaxa for Afib // Pradaxa stopped due to worsening anemia (Hgb in 5/18 8.5 >> Pradaxa remains on hold)   . Pneumonia    hx of  . Stroke ([place])   . Tobacco user    Smokes 1ppd for multiple years.  Quit after hosp 09/2010.  . Tubular adenoma of colon   . Wears glasses     Past Surgical History:  Procedure Laterality Date  . AMPUTATION Right 03/29/2013   Procedure: AMPUTATION RAY;  Surgeon: Newt Minion, MD;  Location: [place];  Service: Orthopedics;  Laterality: Right;  Right Foot 3rd and Possible 4th Ray Amputation  . AMPUTATION Right 04/23/2013   Procedure: AMPUTATION RIGHT MID-FOOT;  Surgeon: Newt Minion, MD;  Location: [place];  Service: Orthopedics;  Laterality: Right;  . arm surgery Left    as a child  . CARDIAC CATHETERIZATION N/A 04/30/2015   Procedure: Left Heart Cath and Coronary  Angiography;  Surgeon: Belva Crome, MD;  Location: [place] [facility];  Service: Cardiovascular;  Laterality: N/A;  . Carotid arteriogram  10/2010   30% right ICA stenosis, 40% left ICA stenosis   . CAROTID ENDARTERECTOMY Left 08-13-15   CEA  . CATARACT EXTRACTION, BILATERAL    . COLONOSCOPY WITH PROPOFOL N/A 06/10/2014   Procedure: COLONOSCOPY WITH PROPOFOL;  Surgeon: Jerene Bears, MD;  Location: [facility];  Service: Gastroenterology;  Laterality: N/A;  . CORONARY ARTERY BYPASS GRAFT N/A 08/13/2015   Procedure: CORONARY ARTERY BYPASS GRAFTING (CABG), ON PUMP, TIMES THREE, USING LEFT INTERNAL MAMMARY ARTERY, RIGHT GREATER SAPHENOUS VEIN HARVESTED ENDOSCOPICALLY;  Surgeon: [name], MD;  Location: [place];  Service: Open Heart Surgery;  Laterality: N/A;  . DEBRIDMENT OF DECUBITUS ULCER Right 02/13/2013  . ENDARTERECTOMY Left 08/13/2015   Procedure: ENDARTERECTOMY CAROTID;   Surgeon: Angelia Mould, MD;  Location: [place];  Service: Vascular;  Laterality: Left;  . ESOPHAGOGASTRODUODENOSCOPY (EGD) WITH PROPOFOL N/A 06/10/2014   Procedure: ESOPHAGOGASTRODUODENOSCOPY (EGD) WITH PROPOFOL;  Surgeon: Jerene Bears, MD;  [facility];  Service: Gastroenterology;  Laterality: N/A;  . GIVENS CAPSULE STUDY N/A 07/09/2014   Procedure: GIVENS CAPSULE STUDY;  Surgeon: Jerene Bears, MD;  [facility];  Service: Gastroenterology;  Laterality: N/A;  . I&D EXTREMITY Right 02/13/2013   Procedure: IRRIGATION AND DEBRIDEMENT FOOT ULCER;  Surgeon: Johnny Bridge, MD;  Location: [place];  Service: Orthopedics;  Laterality: Right;  PULSE LAVAGE  . MULTIPLE TOOTH EXTRACTIONS    . TEE WITHOUT CARDIOVERSION N/A 08/13/2015   Procedure: TRANSESOPHAGEAL ECHOCARDIOGRAM (TEE);  Surgeon: [name], MD;  Location: [place];  Service: Open Heart Surgery;  Laterality: N/A;  . TEMPORARY PACEMAKER N/A 09/15/2017   Procedure: TEMPORARY PACEMAKER;  Surgeon: Jettie Booze, MD;  Location: [place] [facility];  Service: Cardiovascular;  Laterality: N/A;  . TRANSESOPHAGEAL ECHOCARDIOGRAM  09/2010   No ASD or PFO. EF 60-65%.  Normal systolic function. No evidence of thrombus.   . TRANSTHORACIC ECHOCARDIOGRAM  09/2010    The cavity size was normal. Systolic function was vigorous.  EF 65-70%.  Normal wall funciton.   Marland Kitchen ULTRASOUND GUIDANCE FOR VASCULAR ACCESS  09/15/2017   Procedure: Ultrasound Guidance For Vascular Access;  Surgeon: Jettie Booze, MD;  Location: [place] [facility];  Service: Cardiovascular;;    Allergies  Allergen Reactions  . Penicillins Hives, Nausea And Vomiting, Swelling and Other (See Comments)    Tolerated Cefepime Has patient had a PCN reaction causing immediate rash, facial/tongue/throat swelling, SOB or lightheadedness with hypotension: YES Has patient had a PCN reaction causing severe rash involving mucus membranes or skin necrosis: No Has patient had a  PCN reaction that required hospitalization No Has patient had a PCN reaction occurring within the last 10 years: No If all of the above answers are "NO", then may proceed with Cephalosporin use.     Prior to Admission medications   Medication Sig Start Date End Date Taking? Authorizing Provider  acetaminophen (TYLENOL) 325 MG tablet Take 650 mg by mouth 3 (three) times daily as needed for mild pain.   Yes [provider]  amLODipine (NORVASC) 5 MG tablet Take 5 mg by mouth daily.   Yes [provider]  aspirin EC 81 MG tablet Take 81 mg by mouth daily.   Yes [provider]  bisoprolol (ZEBETA) 5 MG tablet Take 5 mg by mouth daily.   Yes [provider]  calcium carbonate (TUMS - DOSED IN MG ELEMENTAL CALCIUM) 500 MG chewable tablet Chew 1 tablet by mouth 3 (three) times daily as needed for indigestion or heartburn.   Yes [provider]  Cholecalciferol (VITAMIN D3) 1000 UNITS CAPS Take 1,000 Units by mouth daily.   Yes [provider]  clonazePAM (KLONOPIN) 0.5 MG tablet Take 1 tablet (0.5 mg total) by mouth at bedtime. 11/03/14  Yes Short, Noah Delaine, MD  furosemide (LASIX) 80 MG tablet Take 1.5 tablets (120 mg total) by mouth 2 (two) times daily. Patient taking differently: Take 80-120 mg by mouth See admin instructions. 120 mg by mouth in the morning and 80 mg at bedtime 10/12/16 09/16/17 Yes Weaver, Scott T, PA-C  gabapentin (NEURONTIN) 600 MG tablet Take 600 mg by mouth 2 (two) times daily.   Yes [provider]  HYDROcodone-acetaminophen (NORCO) 5-325 MG tablet Take 1 tablet by mouth daily.   Yes [provider]  Insulin Glargine (LANTUS) 100 UNIT/ML Solostar Pen Inject 10 Units into the skin every evening.   Yes [provider]  ipratropium-albuterol (DUONEB) 0.5-2.5 (3) MG/3ML SOLN Take 3 mLs by nebulization 2 (two) times daily.   Yes [provider]  metFORMIN (GLUCOPHAGE) 1000 MG tablet Take 1,000 mg  by mouth 2 (two) times daily with a meal.   Yes [provider]  metolazone (ZAROXOLYN) 2.5 MG tablet Take 2.5 mg by mouth every Monday, Wednesday, and Friday.   Yes [provider]  nitroGLYCERIN (NITROSTAT) 0.4 MG SL tablet Place 0.4 mg under the tongue every 5 (five) minutes as needed for chest pain.   Yes [provider]  OXYGEN Inhale 2 L into the lungs as needed (to supplement).   Yes [provider]  pantoprazole (PROTONIX) 40 MG tablet Take 40 mg by mouth daily.   Yes [provider]  PARoxetine (PAXIL) 40 MG tablet Take 40 mg by mouth daily.    Yes [provider]  polyvinyl alcohol (LIQUIFILM TEARS) 1.4 % ophthalmic solution Place 1 drop into both eyes 2 (two) times daily as needed for dry eyes. Natural Tears   Yes [provider]  Rivaroxaban (XARELTO) 15 MG TABS tablet Take 15 mg by mouth every evening.   Yes [provider]  rosuvastatin (CRESTOR) 40 MG tablet Take 40 mg by mouth at bedtime.   Yes [provider]  Skin Protectants, Misc. (EUCERIN) cream Apply 1 application topically 2 (two) times daily. TO AFFECTED AREAS   Yes [provider]    Social History   Socioeconomic History  . Marital status: Single    Spouse name: Not on file  . Number of children: 0  . Years of education: Not on file  . Highest education level: Not on file  Social Needs  . Financial resource strain: Not on file  . Food insecurity - worry: Not on file  . Food insecurity - inability: Not on file  . Transportation needs - medical: Not on file  . Transportation needs - non-medical: Not on file  Occupational History    Employer: DISABLED  Tobacco Use  . Smoking status: Former Smoker    Packs/day: 1.00    Years: 42.00    Pack years: 42.00    Types: Cigarettes, Pipe    Last attempt to quit: 11/10/2010    Years since quitting: 6.8  . Smokeless tobacco: Former Systems developer    Quit date: 10/02/2010  Substance and Sexual  Activity  . Alcohol use: No  Alcohol/week: 0.0 oz    Comment: " last drink was 2003" 08/11/15  . Drug use: No  . Sexual activity: Not on file  Other Topics Concern  . Not on file  Social History Narrative   Released from prison in 2003 after serving 2 years for indecency with a minor.    He smokes cigarettes greater than 30 years, also smoked a pipe.  Quit tobacco 09/2010.   Denies EtOH and drugs.      Family History  Problem Relation Age of Onset  . Heart disease Mother   . Hypertension Sister   . Heart disease Brother   . Heart disease Father   . Peripheral vascular disease Father   . Heart disease Maternal Grandmother   . Heart attack Maternal Grandmother   . Breast cancer Maternal Grandmother   . Stomach cancer Maternal Uncle   . Colon cancer Neg Hx   . Stroke Neg Hx     ROS: _0  Positive   _1  Negative   _2  All sytems reviewed and are negative  Cardiovascular: _3  chest pain/pressure _4  palpitations _5  SOB lying flat _6  DOE _7  pain in legs while walking _8  pain in legs at rest _9  pain in legs at night _10  non-healing ulcers _11  hx of DVT _12  swelling in legs  Pulmonary: _13  productive cough _14  asthma/wheezing _15  home O2  Neurologic: _16  weakness in _17  arms _18  legs _19  numbness in _20  arms _21  legs _22  hx of CVA _23  mini stroke _24 difficulty speaking or slurred speech _25  temporary loss of vision in one eye _26  dizziness  Hematologic: _27  hx of cancer _28  bleeding problems _29  problems with blood clotting easily  Endocrine:   _30  diabetes _31  thyroid disease  GI _32  vomiting blood _33  blood in stool  GU: _34  CKD/renal failure _35  HD--_36  M/W/F or _37  T/T/S _38  burning with urination _39  blood in urine  Psychiatric: _40  anxiety _41  depression  Musculoskeletal: _42  arthritis _43  joint pain  Integumentary: _44  rashes _45  ulcers  Constitutional: _46  fever _47  chills   Physical Examination  Vitals:   10/01/17 0943 10/01/17 1232  BP: (!) 144/66 138/63    Pulse: 64 61  Resp: (!) 23 (!) 34  Temp:    SpO2: 96% 97%   Body mass index is 35.13 kg/m.  General:  WDWN in NAD HENT: WNL, normocephalic Pulmonary: normal non-labored breathing Cardiac: palpable brachial/radial pulses Abdomen: soft, NT/ND, no masses Extremities: mild edema of bue Musculoskeletal: no muscle wasting or atrophy  Neurologic: A&O X 3; SENSATION: normal; MOTOR FUNCTION:  moving all extremities equally. Speech is fluent/normal Psychiatric:  Appropriate mood and affect  CBC    Component Value Date/Time   WBC 8.0 10/01/2017 0358   RBC 2.81 (L) 10/01/2017 0358   HGB 7.2 (L) 10/01/2017 0358   HCT 23.7 (L) 10/01/2017 0358   PLT 86 (L) 10/01/2017 0358   MCV 84.3 10/01/2017 0358   MCH 25.6 (L) 10/01/2017 0358   MCHC 30.4 10/01/2017 0358   RDW 15.9 (H) 10/01/2017 0358   LYMPHSABS 1.6 10/05/2016 1155   MONOABS 0.6 10/05/2016 1155   EOSABS 0.3 10/05/2016 1155   BASOSABS 0.1 10/05/2016 1155    BMET    Component Value Date/Time   NA 136 10/01/2017 0358   NA 141 10/28/2016 1558   K 3.8 10/01/2017 0358   CL 98 (L) 10/01/2017 0358   CO2 27 10/01/2017 0358   GLUCOSE 105 (H) 10/01/2017 0358   BUN 43 (H) 10/01/2017 0358  BUN 52 (H) 10/28/2016 1558   CREATININE 3.10 (H) 10/01/2017 0358   CREATININE 1.46 (H) 08/04/2015 0828   CALCIUM 8.4 (L) 10/01/2017 0358   GFRNONAA 20 (L) 10/01/2017 0358   GFRAA 23 (L) 10/01/2017 0358    COAGS: Lab Results  Component Value Date   INR 1.13 08/17/2015   INR 1.37 08/13/2015   INR 1.06 08/11/2015     Non-Invasive Vascular Imaging:   Vein mapping ordered  ASSESSMENT/PLAN: This is a 62 y.o. male here with septic shock and tachy/brady syndrome. HR currently controlled with amiodarone and he does not have leukocytosis or fever to suggest ongoing infection. Will plan tdc tentatively for tomorrow although if issues arise overnight he can be scheduled later in the week. Will also order vein mapping to consider avf vs avg in the  near future if needed.   Bettye Sitton C. Donzetta Matters, MD [facility] Office: [phone] Pager: [phone]

## 2017-10-01 NOTE — Anesthesia Preprocedure Evaluation (Deleted)
Anesthesia Evaluation  Patient identified by MRN, date of birth, ID band Patient awake    Reviewed: Allergy & Precautions, NPO status , Patient's Chart, lab work & pertinent test results  Airway Mallampati: II  TM Distance: >3 FB Neck ROM: Full    Dental  (+) Teeth Intact   Pulmonary former smoker,    breath sounds clear to auscultation       Cardiovascular hypertension, + CAD and + Past MI   Rhythm:Regular Rate:Normal  Echo 2/19 Left ventricle:  The cavity size was normal. Wall thickness was increased in a pattern of mild LVH. Indeterminant diastolic function, atrial fibrillation. Systolic function was mildly to moderately reduced. The estimated ejection fraction was in the range of 40% to 45%. Diffuse hypokinesis   Neuro/Psych    GI/Hepatic GERD  ,  Endo/Other  diabetes  Renal/GU      Musculoskeletal   Abdominal (+) + obese,   Peds  Hematology   Anesthesia Other Findings   Reproductive/Obstetrics                             Anesthesia Physical  Anesthesia Plan  ASA: III  Anesthesia Plan: MAC   Post-op Pain Management:    Induction:   PONV Risk Score and Plan: 1 and Treatment may vary due to age or medical condition  Airway Management Planned: Nasal Cannula and Simple Face Mask  Additional Equipment:   Intra-op Plan:   Post-operative Plan:   Informed Consent: I have reviewed the patients History and Physical, chart, labs and discussed the procedure including the risks, benefits and alternatives for the proposed anesthesia with the patient or authorized representative who has indicated his/her understanding and acceptance.     Plan Discussed with: Anesthesiologist and CRNA  Anesthesia Plan Comments:         Anesthesia Quick Evaluation                                   Anesthesia Evaluation  Patient identified by MRN, date of birth, ID band Patient  awake    Reviewed: Allergy & Precautions, NPO status , Patient's Chart, lab work & pertinent test results  History of Anesthesia Complications Negative for: history of anesthetic complications  Airway        Dental  (+) Dental Advisory Given   Pulmonary shortness of breath and with exertion, COPD,  oxygen dependent, former smoker,           Cardiovascular hypertension, Pt. on medications and Pt. on home beta blockers + Past MI, + Peripheral Vascular Disease and +CHF  + dysrhythmias Atrial Fibrillation   TTE 5/16: EF 55%; A-fib; mild LVH; normal RV function; trivial MR   Neuro/Psych PSYCHIATRIC DISORDERS Anxiety Mental retardationCVA, No Residual Symptoms    GI/Hepatic GERD  Medicated,  Endo/Other  diabetes, Type 2Morbid obesity  Renal/GU      Musculoskeletal   Abdominal   Peds  Hematology  (+) Blood dyscrasia, anemia ,   Anesthesia Other Findings   Reproductive/Obstetrics                          Anesthesia Physical Anesthesia Plan  ASA: IV  Anesthesia Plan: General   Post-op Pain Management:    Induction: Intravenous  Airway Management Planned: Oral ETT  Additional Equipment: Arterial line, PA Cath, CVP, Ultrasound Guidance Line  Placement and TEE  Intra-op Plan: Utilization Of Total Body Hypothermia per surgeon request  Post-operative Plan: Post-operative intubation/ventilation  Informed Consent:   Dental advisory given  Plan Discussed with:   Anesthesia Plan Comments:        Anesthesia Quick Evaluation

## 2017-10-01 NOTE — Progress Notes (Signed)
Upon assessment, pt very drowsy, but can open eyes and communicate. Per RN pt did not sleep all night, was agitated, and hitting stuff.  Per MD will give him rest and see any changes.   Report was given to 4E26. Pt belongings with the patient. Family called and updated.

## 2017-10-01 NOTE — Progress Notes (Signed)
Admit: 09/15/2017 LOS: 16  28M with anuric AKI (BL SCr 1.6 - 1.7) on CRRT  Subjective:  Agitation/Confusion overnight Minimal UOP RVR intermittent overnight  02/23 0701 - 02/24 0700 In: 865.9 [P.O.:120; I.V.:628.9; IV Piggyback:117] Out: 198 [Urine:198]  Filed Weights   09/29/17 1444 09/30/17 0500 10/01/17 0500  Weight: 82.2 kg (181 lb 3.5 oz) 81.9 kg (180 lb 8.9 oz) 81.6 kg (179 lb 14.3 oz)    Scheduled Meds: . atorvastatin  80 mg Oral Daily  . chlorhexidine  15 mL Mouth Rinse BID  . Chlorhexidine Gluconate Cloth  6 each Topical Daily  . [START ON 10/03/2017] darbepoetin (ARANESP) injection - DIALYSIS  150 mcg Intravenous Q Tue-HD  . dextrose  0.5 ampule Intravenous Once  . docusate sodium  100 mg Oral BID  . fludrocortisone  0.1 mg Oral Daily  . hydrocerin   Topical BID  . insulin aspart  0-20 Units Subcutaneous Q4H  . insulin glargine  15 Units Subcutaneous QHS  . ipratropium-albuterol  3 mL Nebulization BID  . mouth rinse  15 mL Mouth Rinse BID  . mouth rinse  15 mL Mouth Rinse q12n4p  . pantoprazole  40 mg Oral Daily  . senna  1 tablet Oral BID  . senna-docusate  1 tablet Oral QHS  . sodium chloride flush  10-40 mL Intracatheter Q12H  . sodium chloride flush  3 mL Intravenous Q12H   Continuous Infusions: . sodium chloride 10 mL/hr at 10/01/17 0800  . amiodarone 30 mg/hr (10/01/17 0808)  . heparin 1,400 Units/hr (10/01/17 0800)   PRN Meds:.acetaminophen, bisacodyl, camphor-menthol, nitroGLYCERIN, ondansetron (ZOFRAN) IV, polyvinyl alcohol, RESOURCE THICKENUP CLEAR, sodium chloride flush, sodium chloride flush  Current Labs: reviewed  Results for ARBEN, PACKMAN (MRN [id number]) as of 09/27/2017 08:49  Ref. Range 09/26/2017 09:45  Saturation Ratios Latest Ref Range: 17.9 - 39.5 % 52 (H)  Ferritin Latest Ref Range: 24 - 336 ng/mL 816 (H)    Physical Exam:  Blood pressure (!) 144/66, pulse 64, temperature 98.2 F (36.8 C), temperature source Oral, resp. rate (!) 23,  height 5' (1.524 m), weight 81.6 kg (179 lb 14.3 oz), SpO2 96 %. 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 AFib with RVR; bradycardia was with temp pacer; likely to need PPM when improved and EP is following 3. Influenza A with VDRF 4. Septic Shock improving off pressors 5. ASCVD hx/o MI, PAD and CVA (hx/o CEA and CABG) 6. Anemia, not Fe deficient; on ESA 7. DM2 8. Has Temp HD cathteter  P 1. Next HD 2/25: 3K, 2-3L UF, Temp HD Cath, No heparin, Qb 400 2. VVS will see for [facility], not sure would yet put in AVG or AVF as could recover and remains quite ill  [name] MD 10/01/2017, 10:22 AM  Recent Labs  Lab 09/29/17 0304 09/29/17 1100 09/30/17 0401 10/01/17 0358  NA 136 133* 137 136  K 3.2* 4.2 3.5 3.8  CL 97* 96* 98* 98*  CO2 26 24 27 27   GLUCOSE 135* 134* 97 105*  BUN 74* 75* 34* 43*  CREATININE 3.11* 3.26* 2.26* 3.10*  CALCIUM 8.3* 8.2* 8.3* 8.4*  PHOS 2.6 2.5  --  2.6   Recent Labs  Lab 09/30/17 0401 09/30/17 1435 10/01/17 0358  WBC 8.7 7.6 8.0  HGB 7.1* 7.2* 7.2*  HCT 22.5* 23.0* 23.7*  MCV 83.6 84.6 84.3  PLT 100* 91* 86*

## 2017-10-01 NOTE — Progress Notes (Signed)
   Subjective: Patient seen in his bed this morning. He remains drowsy this morning. Nursing reports that he had been agitated overnight reaching out and trying to slide to the edge of the bed to attempt to get up and go to the bathroom. He additionally had recurrence of RVR overnight and was restarted on amiodarone gtt.  Objective:  Vital signs in last 24 hours: Vitals:   10/01/17 0300 10/01/17 0400 10/01/17 0500 10/01/17 0600  BP: 130/60 (!) 118/58 (!) 129/52 (!) 116/52  Pulse: 65 64 64 61  Resp: (!) 25 (!) 25 (!) 21 19  Temp:  (!) 97.5 F (36.4 C)    TempSrc:  Axillary    SpO2: 98% 92% 95% 97%  Weight:   179 lb 14.3 oz (81.6 kg)   Height:       Physical Exam  Constitutional: He appears well-developed.  Drowsy, ill-appearing male  HENT:  Head: Normocephalic and atraumatic.  Cardiovascular: Normal rate, regular rhythm, normal heart sounds and intact distal pulses.  Pulmonary/Chest: Effort normal and breath sounds normal.  Diffusely Rhonchorous  Abdominal: Soft. Bowel sounds are normal. He exhibits no distension. There is no tenderness.  Musculoskeletal: He exhibits no edema or deformity.  Neurological:  Somulent, will rouse to sternal rub or should shaking. Mumbled some appropriate answers and rapidly feel back asleep  Skin: Skin is warm and dry.   Assessment/Plan:  Tachy-Brady Syndrome.  Continues to wait for permanent pacemaker placement. Had episode of A. Fib with RVR overnight. Currently in sinus rhythm. - Avoid metoprolol to prevent any bradycardia. - EP following-appreciate any recommendations.  PAF with RVR.  Currently in sinus rhythm. Episode of A. Fib with RVR overnight and was switched back to IV Amiodarone. - He is not a candidate for long-term anticoagulation due to prior GI bleed on Pradaxa. - Cardiology Following-appreciate their recommendations.  Influenza Pneumonia/respiratory failure: Patient severely deconditioned after prolonged intubation.  Still  requiring 3L of oxygen. He has completed course of antibitoics and Tamiflu. - Will continue monitoring respiratory status - Continue incentive spirometry and flutter valve - Chest PT q4h - Continue DuoNeb - PT/OT evaluation  AKI.  Patient with minimal UOP. On intermittent HD. Cr today was 3.10. - Nephrology is following - Tentatively scheduled for tunneled dialysis catheter tomorrow  - BMP in AM  Anemia.  Hemoglobin down trending this admission, 7.2 today.  Heparin held 2/23 but restarted due to episode of A.fib with RVR. History of GI bleed with Pradaxa the past. On 2/23: Fe 17, Ferritin 87. - Getting Aranesp with nephrology - Feraheme 510mg  IV - Heparin discontinued - Check FOBT - CBC in AM  HFrEF: Stable. Current EF 40-45%.  No sign of acute exacerbation.  CAD. Severe 3 vessel by cath 04/2015 and CABG 2017.  Denies any chest pain. - Continue Lipitor. - No beta-blocker due to bradycardic episodes.  Dysphagia.  Continue dysphagia 2 diet with honey thick liquid.  DM. Current A1c on 09/28/17 was 7.9.  CBG remained between 80-130. - Continue Lantus 15 units at bedtime with SSI.  Adrenal insufficiency: - Continue fludrocortisone.  Dispo: Anticipated discharge in approximately 2-5 day(s).   Neva Seat, MD 10/01/2017, 7:16 AM Pager: [phone]

## 2017-10-01 NOTE — Progress Notes (Signed)
Progress Note  Patient Name: [name] Date of Encounter: 10/01/2017  Primary Cardiologist: Mertie Moores, MD   Subjective   No chest pain or sob. Note episodes of atrial fib  Inpatient Medications    Scheduled Meds: . atorvastatin  80 mg Oral Daily  . chlorhexidine  15 mL Mouth Rinse BID  . Chlorhexidine Gluconate Cloth  6 each Topical Daily  . [START ON 10/03/2017] darbepoetin (ARANESP) injection - DIALYSIS  150 mcg Intravenous Q Tue-HD  . dextrose  0.5 ampule Intravenous Once  . docusate sodium  100 mg Oral BID  . fludrocortisone  0.1 mg Oral Daily  . hydrocerin   Topical BID  . insulin aspart  0-20 Units Subcutaneous Q4H  . insulin glargine  15 Units Subcutaneous QHS  . ipratropium-albuterol  3 mL Nebulization BID  . mouth rinse  15 mL Mouth Rinse BID  . mouth rinse  15 mL Mouth Rinse q12n4p  . pantoprazole  40 mg Oral Daily  . senna  1 tablet Oral BID  . senna-docusate  1 tablet Oral QHS  . sodium chloride flush  10-40 mL Intracatheter Q12H  . sodium chloride flush  3 mL Intravenous Q12H   Continuous Infusions: . sodium chloride 10 mL/hr at 10/01/17 0800  . amiodarone 30 mg/hr (10/01/17 0808)  . heparin 1,400 Units/hr (10/01/17 0800)   PRN Meds: acetaminophen, bisacodyl, camphor-menthol, nitroGLYCERIN, ondansetron (ZOFRAN) IV, polyvinyl alcohol, RESOURCE THICKENUP CLEAR, sodium chloride flush, sodium chloride flush   Vital Signs    Vitals:   10/01/17 0600 10/01/17 0700 10/01/17 0723 10/01/17 0800  BP: (!) 116/52 136/62  131/62  Pulse: 61 64  62  Resp: 19 (!) 21  (!) 22  Temp:   98.2 F (36.8 C)   TempSrc:   Oral   SpO2: 97% 98%  97%  Weight:      Height:        Intake/Output Summary (Last 24 hours) at 10/01/2017 0903 Last data filed at 10/01/2017 0800 Gross per 24 hour  Intake 826.23 ml  Output 208 ml  Net 618.23 ml   Filed Weights   09/29/17 1444 09/30/17 0500 10/01/17 0500  Weight: 181 lb 3.5 oz (82.2 kg) 180 lb 8.9 oz (81.9 kg) 179 lb  14.3 oz (81.6 kg)    Telemetry    Atrial fib with a RVR and now NSR - Personally Reviewed  ECG    none - Personally Reviewed  Physical Exam   GEN: No acute distress.   Neck: No JVD, right IJ catheter in place Cardiac: RRR, no murmurs, rubs, or gallops.  Respiratory: scattered rhonchi bilaterally. GI: Soft, nontender, non-distended  MS: No edema; No deformity. Neuro:  Nonfocal  Psych: Normal affect   Labs    Chemistry Recent Labs  Lab 09/29/17 1100 09/30/17 0401 10/01/17 0358  NA 133* 137 136  K 4.2 3.5 3.8  CL 96* 98* 98*  CO2 _0 GLUCOSE 134* 97 105*  BUN 75* 34* 43*  CREATININE 3.26* 2.26* 3.10*  CALCIUM 8.2* 8.3* 8.4*  PROT  --  4.9*  --   ALBUMIN 2.1* 2.3* 2.2*  AST  --  14*  --   ALT  --  11*  --   ALKPHOS  --  36*  --   BILITOT  --  0.5  --   GFRNONAA 19* 29* 20*  GFRAA 22* 34* 23*  ANIONGAP _1 Hematology Recent Labs  Lab 09/30/17 0401  09/30/17 1435 10/01/17 0358  WBC 8.7 7.6 8.0  RBC 2.69* 2.72* 2.81*  HGB 7.1* 7.2* 7.2*  HCT 22.5* 23.0* 23.7*  MCV 83.6 84.6 84.3  MCH 26.4 26.5 25.6*  MCHC 31.6 31.3 30.4  RDW 15.8* 16.1* 15.9*  PLT 100* 91* 86*    Cardiac EnzymesNo results for input(s): TROPONINI in the last 168 hours. No results for input(s): TROPIPOC in the last 168 hours.   BNPNo results for input(s): BNP, PROBNP in the last 168 hours.   DDimer No results for input(s): DDIMER in the last 168 hours.   Radiology    Dg Chest Port 1 View  Result Date: 09/30/2017 CLINICAL DATA:  Respiratory failure. EXAM: PORTABLE CHEST 1 VIEW COMPARISON:  Chest x-ray from yesterday. FINDINGS: Interval removal of the left internal jugular central venous catheter. Unchanged right internal jugular central venous catheter. Stable mild cardiomegaly status post CABG. Mild pulmonary vascular congestion and interstitial edema, also unchanged. Slightly improved bibasilar atelectasis. Possible small right pleural effusion. No pneumothorax or  consolidation. No acute osseous abnormality. IMPRESSION: 1. Stable vascular congestion and interstitial pulmonary edema. 2. Possible small right pleural effusion. 3. Slightly improved bibasilar atelectasis. Electronically Signed   By: Titus Dubin M.D.   On: 09/30/2017 08:38   Dg Swallowing Func-speech Pathology  Result Date: 09/29/2017 Objective Swallowing Evaluation: Type of Study: MBS-Modified Barium Swallow Study  Patient Details Name: [name] MRN: [id number] Date of Birth: [birth year] Today's Date: 09/29/2017 Time: SLP Start Time (ACUTE ONLY): 1038 -SLP Stop Time (ACUTE ONLY): 1055 SLP Time Calculation (min) (ACUTE ONLY): 17 min Past Medical History: Past Medical History: Diagnosis Date . Abdominal hernia   Chronic, not a good surgical candidate . Abscess, abdomen 12/31/2010  Referred to Wound Care in 01/2011 because of multiple abd abscess with VERY large ventral hernia (please look at image of CT abd/pelvis 09/2010).  Because of hernia I was hesitant to I&D.     Marland Kitchen Anemia   History of Iron Def Anemia . Anxiety  . AVM (arteriovenous malformation)   chronic GI blood loss . Carotid artery disease ([place])   s/p CEA . Chronic diastolic CHF (congestive heart failure) (HCC)   takes Lasix . Chronic low back pain  . COPD (chronic obstructive pulmonary disease) ([place])  . CVA (cerebral infarction) 09/2010  Bilateral with Left > Right . Diabetes mellitus  . GERD (gastroesophageal reflux disease)  . History of nuclear stress test   Myoview 9/16:  Inferior, apical and inf-lateral ischemia; not gated; High Risk . Hx of echocardiogram   Echo 5/16:  Mild LVH, EF 55%, indeterm. diast function, WMA could not be ruled out, MAC, trivial MR, mild LAE, normal RVF //  b. Echo 4/17: EF 55-60%, no RWMA, Gr 2 DD, Ao sclerosis, MAC, mild MS, mild LAE, PASP 55 mHg . Hyperlipidemia  . Hypertension  . Intellectual disability   Sister helps to take care of him and takes him to appts . Itching   all over body; pt scratches and has sores  on bilateral arms and abdomen . Lung nodule  . Myocardial infarction [facility]) 2016 ?  Heart attack  (  Per  pt. ) . Obesity  . Oxygen dependent   wears 2 liters at bedtime and when needed . PAF (paroxysmal atrial fibrillation) ([place]) 06/2009  CHADS score 2 (HTN, DM) // Pradaxa for Afib // Pradaxa stopped due to worsening anemia (Hgb in 5/18 8.5 >> Pradaxa remains on hold)  . Pneumonia   hx of .  Stroke ([place])  . Tobacco user   Smokes 1ppd for multiple years.  Quit after hosp 09/2010. . Tubular adenoma of colon  . Wears glasses  Past Surgical History: Past Surgical History: Procedure Laterality Date . AMPUTATION Right 03/29/2013  Procedure: AMPUTATION RAY;  Surgeon: Newt Minion, MD;  Location: [place];  Service: Orthopedics;  Laterality: Right;  Right Foot 3rd and Possible 4th Ray Amputation . AMPUTATION Right 04/23/2013  Procedure: AMPUTATION RIGHT MID-FOOT;  Surgeon: Newt Minion, MD;  Location: [place];  Service: Orthopedics;  Laterality: Right; . arm surgery Left   as a child . CARDIAC CATHETERIZATION N/A 04/30/2015  Procedure: Left Heart Cath and Coronary Angiography;  Surgeon: Belva Crome, MD;  Location: [place] [facility];  Service: Cardiovascular;  Laterality: N/A; . Carotid arteriogram  10/2010  30% right ICA stenosis, 40% left ICA stenosis  . CAROTID ENDARTERECTOMY Left 08-13-15  CEA . CATARACT EXTRACTION, BILATERAL   . COLONOSCOPY WITH PROPOFOL N/A 06/10/2014  Procedure: COLONOSCOPY WITH PROPOFOL;  Surgeon: Jerene Bears, MD;  [facility];  Service: Gastroenterology;  Laterality: N/A; . CORONARY ARTERY BYPASS GRAFT N/A 08/13/2015  Procedure: CORONARY ARTERY BYPASS GRAFTING (CABG), ON PUMP, TIMES THREE, USING LEFT INTERNAL MAMMARY ARTERY, RIGHT GREATER SAPHENOUS VEIN HARVESTED ENDOSCOPICALLY;  Surgeon: [name], MD;  Location: [place];  Service: Open Heart Surgery;  Laterality: N/A; . DEBRIDMENT OF DECUBITUS ULCER Right 02/13/2013 . ENDARTERECTOMY Left 08/13/2015  Procedure: ENDARTERECTOMY CAROTID;  Surgeon:  Angelia Mould, MD;  Location: [place];  Service: Vascular;  Laterality: Left; . ESOPHAGOGASTRODUODENOSCOPY (EGD) WITH PROPOFOL N/A 06/10/2014  Procedure: ESOPHAGOGASTRODUODENOSCOPY (EGD) WITH PROPOFOL;  Surgeon: Jerene Bears, MD;  [facility];  Service: Gastroenterology;  Laterality: N/A; . GIVENS CAPSULE STUDY N/A 07/09/2014  Procedure: GIVENS CAPSULE STUDY;  Surgeon: Jerene Bears, MD;  [facility];  Service: Gastroenterology;  Laterality: N/A; . I&D EXTREMITY Right 02/13/2013  Procedure: IRRIGATION AND DEBRIDEMENT FOOT ULCER;  Surgeon: Johnny Bridge, MD;  Location: [place];  Service: Orthopedics;  Laterality: Right;  PULSE LAVAGE . MULTIPLE TOOTH EXTRACTIONS   . TEE WITHOUT CARDIOVERSION N/A 08/13/2015  Procedure: TRANSESOPHAGEAL ECHOCARDIOGRAM (TEE);  Surgeon: [name], MD;  Location: [place];  Service: Open Heart Surgery;  Laterality: N/A; . TEMPORARY PACEMAKER N/A 09/15/2017  Procedure: TEMPORARY PACEMAKER;  Surgeon: Jettie Booze, MD;  Location: [place] [facility];  Service: Cardiovascular;  Laterality: N/A; . TRANSESOPHAGEAL ECHOCARDIOGRAM  09/2010  No ASD or PFO. EF 60-65%.  Normal systolic function. No evidence of thrombus.  . TRANSTHORACIC ECHOCARDIOGRAM  09/2010   The cavity size was normal. Systolic function was vigorous.  EF 65-70%.  Normal wall funciton.  Marland Kitchen ULTRASOUND GUIDANCE FOR VASCULAR ACCESS  09/15/2017  Procedure: Ultrasound Guidance For Vascular Access;  Surgeon: Jettie Booze, MD;  Location: [place] [facility];  Service: Cardiovascular;; HPI: 61 yo M with History of paroxysmal A. Fib, DM, CHF, CAD, CABG 2017, carotid stenosis (S/P L Carotid Endarterectomy), GERD, CVA, COPD, HTN, Abdominal Hernia, and Diabetes presented  generalized weakness and was found to have flu and was in A. Fib with RVR. Developed AKI requiring transient CRRT. Acute hypoxemic respiratory failure with bilateral pulmonary infiltrates. Intubated 2/12-2/21. CXR 2/22 increasing interstitial  prominence, likely interstitial edema. Increasing bibasilar atelectasis. BSE 08/12/16 primary esophageal dysphagia. Esophagram same day no esophageal obstruction or stricture. Presbyesophagus. penetration of barium to the level of the vocal cords occurred during the exam and did elicit intermittent coughing. Recommend  followup speech pathology evaluation to evaluate for aspiration risk.  No Data Recorded Assessment / Plan / Recommendation CHL IP CLINICAL IMPRESSIONS 09/29/2017 Clinical Impression Pt exhibited mild pharyngeal dysphagia resulting in penetration with nectar and honey barium due to delayed closing of larynx. Pt penetrated with honey thick x 1 (mild) occurred during verbalization. Partial clearance of laryngeal vestibule with verbal cues for hard cough. There was min-mild vallecular residue most clearing with subsequent swallows. Mastication of soft cereal bar was functional. MBS does not diagnose below the level of the UES however brief esohageal scan did not reveal abnormalities (pt has hx of presbyesophagus). Recommend Dys 2, honey thick liquids, full supervision and assist, intermittent cough and double swallow, crush pills and remain upright after meals. Prognosis is good for return to unrestricted liquids given increased time post extubation and increased cognitive clarity.    SLP Visit Diagnosis Dysphagia, pharyngeal phase (R13.13) Attention and concentration deficit following -- Frontal lobe and executive function deficit following -- Impact on safety and function Moderate aspiration risk   CHL IP TREATMENT RECOMMENDATION 09/29/2017 Treatment Recommendations Therapy as outlined in treatment plan below   Prognosis 09/29/2017 Prognosis for Safe Diet Advancement Good Barriers to Reach Goals -- Barriers/Prognosis Comment -- CHL IP DIET RECOMMENDATION 09/29/2017 SLP Diet Recommendations Dysphagia 2 (Fine chop) solids;Honey thick liquids Liquid Administration via Cup Medication Administration Crushed with  puree Compensations Minimize environmental distractions;Slow rate;Small sips/bites;Clear throat intermittently Postural Changes Seated upright at 90 degrees   CHL IP OTHER RECOMMENDATIONS 09/29/2017 Recommended Consults -- Oral Care Recommendations Oral care BID Other Recommendations --   CHL IP FOLLOW UP RECOMMENDATIONS 09/29/2017 Follow up Recommendations Other (comment)   CHL IP FREQUENCY AND DURATION 09/29/2017 Speech Therapy Frequency (ACUTE ONLY) min 2x/week Treatment Duration 2 weeks      CHL IP ORAL PHASE 09/29/2017 Oral Phase WFL Oral - Pudding Teaspoon -- Oral - Pudding Cup -- Oral - Honey Teaspoon -- Oral - Honey Cup -- Oral - Nectar Teaspoon -- Oral - Nectar Cup -- Oral - Nectar Straw -- Oral - Thin Teaspoon -- Oral - Thin Cup -- Oral - Thin Straw -- Oral - Puree -- Oral - Mech Soft -- Oral - Regular -- Oral - Multi-Consistency -- Oral - Pill -- Oral Phase - Comment --  CHL IP PHARYNGEAL PHASE 09/29/2017 Pharyngeal Phase Impaired Pharyngeal- Pudding Teaspoon -- Pharyngeal -- Pharyngeal- Pudding Cup -- Pharyngeal -- Pharyngeal- Honey Teaspoon -- Pharyngeal -- Pharyngeal- Honey Cup Pharyngeal residue - valleculae;Penetration/Aspiration during swallow Pharyngeal Material enters airway, remains ABOVE vocal cords and not ejected out Pharyngeal- Nectar Teaspoon -- Pharyngeal -- Pharyngeal- Nectar Cup Reduced airway/laryngeal closure;Penetration/Aspiration during swallow Pharyngeal Material enters airway, remains ABOVE vocal cords and not ejected out Pharyngeal- Nectar Straw -- Pharyngeal -- Pharyngeal- Thin Teaspoon -- Pharyngeal -- Pharyngeal- Thin Cup -- Pharyngeal -- Pharyngeal- Thin Straw -- Pharyngeal -- Pharyngeal- Puree -- Pharyngeal -- Pharyngeal- Mechanical Soft WFL Pharyngeal -- Pharyngeal- Regular -- Pharyngeal -- Pharyngeal- Multi-consistency -- Pharyngeal -- Pharyngeal- Pill -- Pharyngeal -- Pharyngeal Comment --  CHL IP CERVICAL ESOPHAGEAL PHASE 09/29/2017 Cervical Esophageal Phase WFL Pudding Teaspoon  -- Pudding Cup -- Honey Teaspoon -- Honey Cup -- Nectar Teaspoon -- Nectar Cup -- Nectar Straw -- Thin Teaspoon -- Thin Cup -- Thin Straw -- Puree -- Mechanical Soft -- Regular -- Multi-consistency -- Pill -- Cervical Esophageal Comment -- No flowsheet data found. Houston Siren 09/29/2017, 12:16 PM  Orbie Pyo Colvin Caroli.[employer] [phone]  Cardiac Studies   none  Patient Profile     62 y.o. male admitted with flu/respiratory failure, and atrial fib with a RVR and long post termination pauses, s/p temp PPM which has now been removed  Assessment & Plan    1. Symptomatic brady tachy syndrome - he is improved in that his pauses are much better. Still some atrial fib with a RVR though rates not quite as fast. At this point, he is a poor candidate for PPM due to multiple comorbidities 2. PAF - he went back into atrial fib last night but was placed back on IV amio and is now back to NSR. Continue IV amio today and if he is stable overnight, would switch back to oral amio. 3. Chronic systolic heart failure - he appears euvolemic with no obvious volume overload although exam today with more rhonchi. 4. Anemia - hct remains low. No worsening however. Consider transfusion. 5. CAD - he has not had angina. Unable to take beta blocker due to angina although pacing would allow this. For questions or updates, please contact [place] Please consult [email or web address] for contact info under Cardiology/STEMI.      Signed, Cristopher Peru, MD  10/01/2017, 9:03 AM  Patient ID: [name], male   DOB: Aug 05, 1956, 62 y.o.   MRN: [id number]

## 2017-10-01 NOTE — Progress Notes (Signed)
[place] for heparin Indication: atrial fibrillation  Allergies  Allergen Reactions  . Penicillins Hives, Nausea And Vomiting, Swelling and Other (See Comments)    Tolerated Cefepime Has patient had a PCN reaction causing immediate rash, facial/tongue/throat swelling, SOB or lightheadedness with hypotension: YES Has patient had a PCN reaction causing severe rash involving mucus membranes or skin necrosis: No Has patient had a PCN reaction that required hospitalization No Has patient had a PCN reaction occurring within the last 10 years: No If all of the above answers are "NO", then may proceed with Cephalosporin use.     Patient Measurements: Height: 5' (152.4 cm) Weight: 179 lb 14.3 oz (81.6 kg) IBW/kg (Calculated) : 50 Heparin Dosing Weight: 70kg  Vital Signs: Temp: 98.2 F (36.8 C) (02/24 0723) Temp Source: Oral (02/24 0943) BP: 144/66 (02/24 0943) Pulse Rate: 64 (02/24 0943)  Labs: Recent Labs    09/29/17 0304 09/29/17 1100 09/30/17 0401 09/30/17 0405 09/30/17 1435 10/01/17 0358  HGB 7.2*  --  7.1*  --  7.2* 7.2*  HCT 22.7*  --  22.5*  --  23.0* 23.7*  PLT 120*  --  100*  --  91* 86*  HEPARINUNFRC 0.37  --   --  0.35  --   --   CREATININE 3.11* 3.26* 2.26*  --   --  3.10*    Estimated Creatinine Clearance: 21.6 mL/min (A) (by C-G formula based on SCr of 3.1 mg/dL (H)).   Medical History: Past Medical History:  Diagnosis Date  . Abdominal hernia    Chronic, not a good surgical candidate  . Abscess, abdomen 12/31/2010   Referred to Wound Care in 01/2011 because of multiple abd abscess with VERY large ventral hernia (please look at image of CT abd/pelvis 09/2010).  Because of hernia I was hesitant to I&D.      Marland Kitchen Anemia    History of Iron Def Anemia  . Anxiety   . AVM (arteriovenous malformation)    chronic GI blood loss  . Carotid artery disease ([place])    s/p CEA  . Chronic diastolic CHF (congestive heart failure) (HCC)     takes Lasix  . Chronic low back pain   . COPD (chronic obstructive pulmonary disease) ([place])   . CVA (cerebral infarction) 09/2010   Bilateral with Left > Right  . Diabetes mellitus   . GERD (gastroesophageal reflux disease)   . History of nuclear stress test    Myoview 9/16:  Inferior, apical and inf-lateral ischemia; not gated; High Risk  . Hx of echocardiogram    Echo 5/16:  Mild LVH, EF 55%, indeterm. diast function, WMA could not be ruled out, MAC, trivial MR, mild LAE, normal RVF //  b. Echo 4/17: EF 55-60%, no RWMA, Gr 2 DD, Ao sclerosis, MAC, mild MS, mild LAE, PASP 55 mHg  . Hyperlipidemia   . Hypertension   . Intellectual disability    Sister helps to take care of him and takes him to appts  . Itching    all over body; pt scratches and has sores on bilateral arms and abdomen  . Lung nodule   . Myocardial infarction [facility]) 2016 ?   Heart attack  (  Per  pt. )  . Obesity   . Oxygen dependent    wears 2 liters at bedtime and when needed  . PAF (paroxysmal atrial fibrillation) ([place]) 06/2009   CHADS score 2 (HTN, DM) // Pradaxa for Afib // Pradaxa stopped  due to worsening anemia (Hgb in 5/18 8.5 >> Pradaxa remains on hold)   . Pneumonia    hx of  . Stroke ([place])   . Tobacco user    Smokes 1ppd for multiple years.  Quit after hosp 09/2010.  . Tubular adenoma of colon   . Wears glasses      Assessment: 62yo male had been on heparin gtt for Afib w/ RVR but had been taken off, now back in Afib, to resume anticoag; of note pt was on long-term anticoag in the past but was deemed to not be candidate due to GIB on Pradaxa. HD started 2/20 and VVS to see for AVG/AVF -heparin level is at goal on 1350 units/hr  Goal of Therapy:  Heparin level 0.3-0.7 units/ml Monitor platelets by anticoagulation protocol: Yes   Plan:  -no heparin changes needed -Daily heparin level and CBC  Hildred Laser, Pharm D 10/01/2017 10:54 AM

## 2017-10-02 ENCOUNTER — Inpatient Hospital Stay (HOSPITAL_COMMUNITY): Payer: Medicare (Managed Care)

## 2017-10-02 ENCOUNTER — Encounter (HOSPITAL_COMMUNITY): Payer: Self-pay | Admitting: Surgery

## 2017-10-02 ENCOUNTER — Encounter (HOSPITAL_COMMUNITY): Payer: Medicare (Managed Care)

## 2017-10-02 ENCOUNTER — Inpatient Hospital Stay (HOSPITAL_COMMUNITY): Payer: Medicare (Managed Care) | Admitting: Anesthesiology

## 2017-10-02 ENCOUNTER — Encounter (HOSPITAL_COMMUNITY)
Admission: EM | Disposition: A | Discharge status: Skilled Nursing Facility | Payer: Self-pay | Source: Home / Self Care | Attending: Internal Medicine

## 2017-10-02 DIAGNOSIS — Z992 Dependence on renal dialysis: Secondary | ICD-10-CM

## 2017-10-02 DIAGNOSIS — D631 Anemia in chronic kidney disease: Secondary | ICD-10-CM

## 2017-10-02 DIAGNOSIS — D638 Anemia in other chronic diseases classified elsewhere: Secondary | ICD-10-CM

## 2017-10-02 DIAGNOSIS — E1122 Type 2 diabetes mellitus with diabetic chronic kidney disease: Secondary | ICD-10-CM

## 2017-10-02 DIAGNOSIS — J1100 Influenza due to unidentified influenza virus with unspecified type of pneumonia: Secondary | ICD-10-CM

## 2017-10-02 DIAGNOSIS — J9690 Respiratory failure, unspecified, unspecified whether with hypoxia or hypercapnia: Secondary | ICD-10-CM

## 2017-10-02 DIAGNOSIS — N185 Chronic kidney disease, stage 5: Secondary | ICD-10-CM

## 2017-10-02 DIAGNOSIS — N189 Chronic kidney disease, unspecified: Secondary | ICD-10-CM

## 2017-10-02 DIAGNOSIS — E2740 Unspecified adrenocortical insufficiency: Secondary | ICD-10-CM

## 2017-10-02 DIAGNOSIS — F1310 Sedative, hypnotic or anxiolytic abuse, uncomplicated: Secondary | ICD-10-CM

## 2017-10-02 HISTORY — PX: INSERTION OF DIALYSIS CATHETER: SHX1324

## 2017-10-02 LAB — GLUCOSE, CAPILLARY
GLUCOSE-CAPILLARY: 113 mg/dL — AB (ref 65–99)
GLUCOSE-CAPILLARY: 78 mg/dL (ref 65–99)
GLUCOSE-CAPILLARY: 79 mg/dL (ref 65–99)
Glucose-Capillary: 96 mg/dL (ref 65–99)
Glucose-Capillary: 108 mg/dL — ABNORMAL HIGH (ref 65–99)
Glucose-Capillary: 111 mg/dL — ABNORMAL HIGH (ref 65–99)

## 2017-10-02 LAB — RENAL FUNCTION PANEL
ALBUMIN: 2.1 g/dL — AB (ref 3.5–5.0)
ANION GAP: 12 (ref 5–15)
BUN: 50 mg/dL — AB (ref 6–20)
CHLORIDE: 97 mmol/L — AB (ref 101–111)
CO2: 27 mmol/L (ref 22–32)
Calcium: 8.4 mg/dL — ABNORMAL LOW (ref 8.9–10.3)
Creatinine, Ser: 3.94 mg/dL — ABNORMAL HIGH (ref 0.61–1.24)
GFR calc Af Amer: 17 mL/min — ABNORMAL LOW (ref >60)
GFR calc non Af Amer: 15 mL/min — ABNORMAL LOW (ref >60)
GLUCOSE: 98 mg/dL (ref 65–99)
PHOSPHORUS: 4.3 mg/dL (ref 2.5–4.6)
POTASSIUM: 3.8 mmol/L (ref 3.5–5.1)
Sodium: 136 mmol/L (ref 135–145)

## 2017-10-02 LAB — CBC
HCT: 24.7 % — ABNORMAL LOW (ref 39.0–52.0)
HEMATOCRIT: 22.1 % — AB (ref 39.0–52.0)
HEMOGLOBIN: 6.8 g/dL — AB (ref 13.0–17.0)
Hemoglobin: 8 g/dL — ABNORMAL LOW (ref 13.0–17.0)
MCH: 26.2 pg (ref 26.0–34.0)
MCH: 27.8 pg (ref 26.0–34.0)
MCHC: 30.8 g/dL (ref 30.0–36.0)
MCHC: 32.4 g/dL (ref 30.0–36.0)
MCV: 85 fL (ref 78.0–100.0)
MCV: 85.8 fL (ref 78.0–100.0)
PLATELETS: 104 10*3/uL — AB (ref 150–400)
Platelets: 106 10*3/uL — ABNORMAL LOW (ref 150–400)
RBC: 2.6 MIL/uL — ABNORMAL LOW (ref 4.22–5.81)
RBC: 2.88 MIL/uL — ABNORMAL LOW (ref 4.22–5.81)
RDW: 16.1 % — AB (ref 11.5–15.5)
RDW: 16.6 % — AB (ref 11.5–15.5)
WBC: 7.1 10*3/uL (ref 4.0–10.5)
WBC: 5.7 10*3/uL (ref 4.0–10.5)

## 2017-10-02 LAB — PREPARE RBC (CROSSMATCH)

## 2017-10-02 LAB — HEPARIN LEVEL (UNFRACTIONATED): Heparin Unfractionated: 0.62 IU/mL (ref 0.30–0.70)

## 2017-10-02 SURGERY — INSERTION OF DIALYSIS CATHETER
Anesthesia: Monitor Anesthesia Care | Site: Neck

## 2017-10-02 MED ORDER — PROPOFOL 500 MG/50ML IV EMUL
INTRAVENOUS | Status: DC | PRN
  Administered 2017-10-02: 25 ug/kg/min via INTRAVENOUS

## 2017-10-02 MED ORDER — SODIUM CHLORIDE 0.9 % IV SOLN
INTRAVENOUS | Status: DC | PRN
  Administered 2017-10-02: 13:00:00 via INTRAVENOUS

## 2017-10-02 MED ORDER — SODIUM CHLORIDE 0.9 % IV SOLN
INTRAVENOUS | Status: DC | PRN
  Administered 2017-10-02: 5000 mL

## 2017-10-02 MED ORDER — HEPARIN SODIUM (PORCINE) 1000 UNIT/ML IJ SOLN
INTRAMUSCULAR | Status: DC | PRN
  Administered 2017-10-02: 1000 [IU]

## 2017-10-02 MED ORDER — ALBUMIN HUMAN 25 % IV SOLN
INTRAVENOUS | Status: AC
  Filled 2017-10-02: qty 100

## 2017-10-02 MED ORDER — SODIUM CHLORIDE 0.9 % IV SOLN: Freq: Once | INTRAVENOUS | Status: DC

## 2017-10-02 MED ORDER — MIDAZOLAM HCL 5 MG/5ML IJ SOLN
INTRAMUSCULAR | Status: DC | PRN
  Administered 2017-10-02 (×2): .5 mg via INTRAVENOUS
  Administered 2017-10-02: 0.5 mg via INTRAVENOUS
  Administered 2017-10-02: .5 mg via INTRAVENOUS

## 2017-10-02 MED ORDER — HEPARIN (PORCINE) IN NACL 100-0.45 UNIT/ML-% IJ SOLN
1400.0000 [IU]/h | INTRAMUSCULAR | Status: DC
  Administered 2017-10-02: 1400 [IU]/h via INTRAVENOUS
  Filled 2017-10-02: qty 250

## 2017-10-02 MED ORDER — LIDOCAINE 2% (20 MG/ML) 5 ML SYRINGE
INTRAMUSCULAR | Status: AC
  Filled 2017-10-02: qty 5

## 2017-10-02 MED ORDER — LIDOCAINE HCL 1 % IJ SOLN
INTRAMUSCULAR | Status: DC | PRN
  Administered 2017-10-02: 10 mL

## 2017-10-02 MED ORDER — SODIUM CHLORIDE 0.9 % IV SOLN
510.0000 mg | Freq: Once | INTRAVENOUS | Status: AC
  Administered 2017-10-02: 510 mg via INTRAVENOUS
  Filled 2017-10-02: qty 17

## 2017-10-02 MED ORDER — RENA-VITE PO TABS
1.0000 | ORAL_TABLET | Freq: Every day | ORAL | Status: DC
  Administered 2017-10-02 – 2017-10-25 (×24): 1 via ORAL
  Filled 2017-10-02 (×24): qty 1

## 2017-10-02 MED ORDER — HEPARIN SODIUM (PORCINE) 1000 UNIT/ML IJ SOLN
INTRAMUSCULAR | Status: AC
  Filled 2017-10-02: qty 1

## 2017-10-02 MED ORDER — PHENYLEPHRINE HCL 10 MG/ML IJ SOLN
INTRAVENOUS | Status: DC | PRN
  Administered 2017-10-02: 20 ug/min via INTRAVENOUS

## 2017-10-02 MED ORDER — LIDOCAINE HCL (PF) 1 % IJ SOLN
INTRAMUSCULAR | Status: AC
  Filled 2017-10-02: qty 30

## 2017-10-02 MED ORDER — FENTANYL CITRATE (PF) 100 MCG/2ML IJ SOLN
INTRAMUSCULAR | Status: DC | PRN
  Administered 2017-10-02 (×2): 25 ug via INTRAVENOUS

## 2017-10-02 MED ORDER — PROPOFOL 10 MG/ML IV BOLUS
INTRAVENOUS | Status: AC
  Filled 2017-10-02: qty 20

## 2017-10-02 MED ORDER — ALBUMIN HUMAN 25 % IV SOLN
INTRAVENOUS | Status: AC
  Administered 2017-10-02: 25 g
  Filled 2017-10-02: qty 100

## 2017-10-02 MED ORDER — FENTANYL CITRATE (PF) 250 MCG/5ML IJ SOLN
INTRAMUSCULAR | Status: AC
  Filled 2017-10-02: qty 5

## 2017-10-02 MED ORDER — 0.9 % SODIUM CHLORIDE (POUR BTL) OPTIME
TOPICAL | Status: DC | PRN
  Administered 2017-10-02: 1000 mL

## 2017-10-02 SURGICAL SUPPLY — 43 items
ADH SKN CLS APL DERMABOND .7 (GAUZE/BANDAGES/DRESSINGS) ×1
BAG DECANTER FOR FLEXI CONT (MISCELLANEOUS) ×2 IMPLANT
BIOPATCH RED 1 DISK 7.0 (GAUZE/BANDAGES/DRESSINGS) ×2 IMPLANT
CATH PALINDROME RT-P 15FX19CM (CATHETERS) IMPLANT
CATH PALINDROME RT-P 15FX23CM (CATHETERS) ×1 IMPLANT
CATH PALINDROME RT-P 15FX28CM (CATHETERS) IMPLANT
CATH PALINDROME RT-P 15FX55CM (CATHETERS) IMPLANT
COVER PROBE W GEL 5X96 (DRAPES) ×1 IMPLANT
COVER SURGICAL LIGHT HANDLE (MISCELLANEOUS) ×2 IMPLANT
DECANTER SPIKE VIAL GLASS SM (MISCELLANEOUS) ×2 IMPLANT
DERMABOND ADVANCED (GAUZE/BANDAGES/DRESSINGS) ×1
DERMABOND ADVANCED .7 DNX12 (GAUZE/BANDAGES/DRESSINGS) IMPLANT
DRAPE C-ARM 42X72 X-RAY (DRAPES) ×2 IMPLANT
DRAPE CHEST BREAST 15X10 FENES (DRAPES) ×2 IMPLANT
DRSG COVADERM 4X6 (GAUZE/BANDAGES/DRESSINGS) ×1 IMPLANT
GLOVE BIO SURGEON STRL SZ 6.5 (GLOVE) ×1 IMPLANT
GLOVE BIO SURGEON STRL SZ7.5 (GLOVE) ×1 IMPLANT
GLOVE BIOGEL PI IND STRL 6.5 (GLOVE) IMPLANT
GLOVE BIOGEL PI INDICATOR 6.5 (GLOVE) ×1
GLOVE SS BIOGEL STRL SZ 7.5 (GLOVE) ×1 IMPLANT
GLOVE SUPERSENSE BIOGEL SZ 7.5 (GLOVE)
GOWN STRL REUS W/ TWL LRG LVL3 (GOWN DISPOSABLE) ×2 IMPLANT
GOWN STRL REUS W/TWL LRG LVL3 (GOWN DISPOSABLE) ×6
KIT BASIN OR (CUSTOM PROCEDURE TRAY) ×2 IMPLANT
KIT ROOM TURNOVER OR (KITS) ×2 IMPLANT
NDL 18GX1X1/2 (RX/OR ONLY) (NEEDLE) ×1 IMPLANT
NDL HYPO 25GX1X1/2 BEV (NEEDLE) ×1 IMPLANT
NEEDLE 18GX1X1/2 (RX/OR ONLY) (NEEDLE) ×2 IMPLANT
NEEDLE 22X1 1/2 (OR ONLY) (NEEDLE) IMPLANT
NEEDLE HYPO 25GX1X1/2 BEV (NEEDLE) ×2 IMPLANT
NS IRRIG 1000ML POUR BTL (IV SOLUTION) ×2 IMPLANT
PACK SURGICAL SETUP 50X90 (CUSTOM PROCEDURE TRAY) ×2 IMPLANT
PAD ARMBOARD 7.5X6 YLW CONV (MISCELLANEOUS) ×4 IMPLANT
SOAP 2 % CHG 4 OZ (WOUND CARE) ×2 IMPLANT
SUT ETHILON 3 0 PS 1 (SUTURE) ×2 IMPLANT
SUT VICRYL 4-0 PS2 18IN ABS (SUTURE) ×2 IMPLANT
SYR 10ML LL (SYRINGE) ×2 IMPLANT
SYR 20CC LL (SYRINGE) ×2 IMPLANT
SYR 5ML LL (SYRINGE) ×4 IMPLANT
SYR CONTROL 10ML LL (SYRINGE) ×2 IMPLANT
TOWEL GREEN STERILE (TOWEL DISPOSABLE) ×4 IMPLANT
TOWEL GREEN STERILE FF (TOWEL DISPOSABLE) ×2 IMPLANT
WATER STERILE IRR 1000ML POUR (IV SOLUTION) ×2 IMPLANT

## 2017-10-02 NOTE — Progress Notes (Signed)
Subjective: Interval History: has no complaint .  Objective: Vital signs in last 24 hours: Temp:  [97.5 F (36.4 C)-98.8 F (37.1 C)] 98.8 F (37.1 C) (02/25 0342) Pulse Rate:  [57-64] 57 (02/25 0830) Resp:  [20-34] 20 (02/24 1624) BP: (101-154)/(60-72) 101/65 (02/25 0830) SpO2:  [93 %-100 %] 99 % (02/25 0048) Weight:  [85.6 kg (188 lb 11.4 oz)] 85.6 kg (188 lb 11.4 oz) (02/25 0342) Weight change: 4 kg (8 lb 13.1 oz)  Intake/Output from previous day: 02/24 0701 - 02/25 0700 In: 400.7 [P.O.:360; I.V.:40.7] Out: 470 [Urine:470] Intake/Output this shift: No intake/output data recorded.  General appearance: cooperative, moderately obese, pale and slowed mentation Neck: IJ HD cath Resp: rales bibasilar and rhonchi bilaterally Cardio: regular rate and rhythm and systolic murmur: holosystolic 2/6, blowing at apex GI: obese,pos bs, liver down 5 cm Extremities: edema 3+ and RTMA  Lab Results: Recent Labs    10/01/17 0358 10/02/17 0748  WBC 8.0 7.1  HGB 7.2* 6.8*  HCT 23.7* 22.1*  PLT 86* 106*   BMET:  Recent Labs    10/01/17 0358 10/02/17 0624  NA 136 136  K 3.8 3.8  CL 98* 97*  CO2 27 27  GLUCOSE 105* 98  BUN 43* 50*  CREATININE 3.10* 3.94*  CALCIUM 8.4* 8.4*   No results for input(s): PTH in the last 72 hours. Iron Studies:  Recent Labs    09/30/17 1435  IRON 17*  TIBC 133*  FERRITIN 87    Studies/Results: No results found.  I have reviewed the patient's current medications.  Assessment/Plan: 1  CKD 3/AKI oliguric ATN  . On hd, vol xs, .  Will get perm access. Sepsis and cardiac etio 2 Anemia address 3 DM controlled 4 HPTH check 5 PVD 6 CAD 7 pneu with sepsis on AB 8 Severe debill P HD. Lower vol , address anemia, check PTH. Transfuse    LOS: 17 days   Jeneen Rinks Krishan Mcbreen 10/02/2017,8:54 AM

## 2017-10-02 NOTE — Procedures (Signed)
I was present at this session.  I have reviewed the session itself and made appropriate changes.  HD via temp cath . Anemic to get PRBC. Edematous , try to lessen. Poor flow with temp cath  [name] 2/25/20198:52 AM

## 2017-10-02 NOTE — Progress Notes (Signed)
SLP Cancellation Note  Patient Details Name: [name] MRN: [id number] DOB: [birth year]   Cancelled treatment:       Reason Eval/Treat Not Completed: Medical issues which prohibited therapy ; pt in OR when attempted.  Elvina Sidle, M.S., CCC-SLP 10/02/2017, 1:46 PM

## 2017-10-02 NOTE — Progress Notes (Signed)
PT Cancellation Note  Patient Details Name: [name] MRN: [id number] DOB: 08-17-1955   Cancelled Treatment:    Reason Eval/Treat Not Completed: Patient at procedure or test/unavailable.  Attempted to see pt x2.  Pt off unit for dialysis on first attempt, and in OR for [facility] placement on second.  Will f/u as able.     Shann Medal, PT, DPT 10/02/17 1:11 PM

## 2017-10-02 NOTE — Progress Notes (Addendum)
Came by to see patient but patient in surgery. Telemetry reviewed. No significant pauses. As per Dr Tanna Furry note yesterday, will try to avoid PPM implant with co-morbidities.  Will see patient again tomorrow.   Chanetta Marshall, NP 10/02/2017 2:34 PM   EP Attending  Agree with above.   Mikle Bosworth.D.

## 2017-10-02 NOTE — Progress Notes (Signed)
  Progress Note    10/02/2017 7:14 AM 17 Days Post-Op  Subjective:  No overnight issues  Vitals:   10/02/17 0048 10/02/17 0342  BP: (!) 142/60 139/64  Pulse:    Resp:    Temp: 98.8 F (37.1 C) 98.8 F (37.1 C)  SpO2: 99%     Physical Exam: aaox3 Non labored respirations Right ij catheter in place without erythema  CBC    Component Value Date/Time   WBC 8.0 10/01/2017 0358   RBC 2.81 (L) 10/01/2017 0358   HGB 7.2 (L) 10/01/2017 0358   HCT 23.7 (L) 10/01/2017 0358   PLT 86 (L) 10/01/2017 0358   MCV 84.3 10/01/2017 0358   MCH 25.6 (L) 10/01/2017 0358   MCHC 30.4 10/01/2017 0358   RDW 15.9 (H) 10/01/2017 0358   LYMPHSABS 1.6 10/05/2016 1155   MONOABS 0.6 10/05/2016 1155   EOSABS 0.3 10/05/2016 1155   BASOSABS 0.1 10/05/2016 1155    BMET    Component Value Date/Time   NA 136 10/01/2017 0358   NA 141 10/28/2016 1558   K 3.8 10/01/2017 0358   CL 98 (L) 10/01/2017 0358   CO2 27 10/01/2017 0358   GLUCOSE 105 (H) 10/01/2017 0358   BUN 43 (H) 10/01/2017 0358   BUN 52 (H) 10/28/2016 1558   CREATININE 3.10 (H) 10/01/2017 0358   CREATININE 1.46 (H) 08/04/2015 0828   CALCIUM 8.4 (L) 10/01/2017 0358   GFRNONAA 20 (L) 10/01/2017 0358   GFRAA 23 (L) 10/01/2017 0358    INR    Component Value Date/Time   INR 1.13 08/17/2015 0357     Intake/Output Summary (Last 24 hours) at 10/02/2017 0714 Last data filed at 10/02/2017 0344 Gross per 24 hour  Intake 400.7 ml  Output 470 ml  Net -69.3 ml     Assessment:  62 y.o. male is admitted with septic shock now resolved in need of tunneled catheter  Plan: OR today for conversion to tdc Will plan avf in the future if needed-vein mapping ordered   Hibah Odonnell C. Donzetta Matters, MD [facility] Office: [phone] Pager: [phone]  10/02/2017 7:14 AM

## 2017-10-02 NOTE — Transfer of Care (Signed)
Immediate Anesthesia Transfer of Care Note  Patient: [name]  Procedure(s) Performed: INSERTION OF TUNNELED DIALYSIS CATHETER (N/A Neck)  Patient Location: PACU  Anesthesia Type:MAC  Level of Consciousness: awake and patient cooperative  Airway & Oxygen Therapy: Patient Spontanous Breathing and Patient connected to nasal cannula oxygen  Post-op Assessment: Report given to RN, Post -op Vital signs reviewed and stable and Patient moving all extremities  Post vital signs: Reviewed and stable  Last Vitals:  Vitals:   10/02/17 1200 10/02/17 1355  BP: (!) 142/63 123/64  Pulse: 63 60  Resp: 14 13  Temp: 36.4 C (P) 36.6 C  SpO2: 100% 92%    Last Pain:  Vitals:   10/02/17 1200  TempSrc: Oral  PainSc:       Patients Stated Pain Goal: 3 (09/30/34 1224)  Complications: No apparent anesthesia complications

## 2017-10-02 NOTE — Progress Notes (Signed)
[facility] for heparin Indication: atrial fibrillation  Allergies  Allergen Reactions  . Penicillins Hives, Nausea And Vomiting, Swelling and Other (See Comments)    Tolerated Cefepime Has patient had a PCN reaction causing immediate rash, facial/tongue/throat swelling, SOB or lightheadedness with hypotension: YES Has patient had a PCN reaction causing severe rash involving mucus membranes or skin necrosis: No Has patient had a PCN reaction that required hospitalization No Has patient had a PCN reaction occurring within the last 10 years: No If all of the above answers are "NO", then may proceed with Cephalosporin use.     Patient Measurements: Height: 5' (152.4 cm) Weight: 188 lb 11.4 oz (85.6 kg) IBW/kg (Calculated) : 50 Heparin Dosing Weight: 70kg  Vital Signs: Temp: 98.8 F (37.1 C) (02/25 0342) Temp Source: Oral (02/25 0342) BP: 101/65 (02/25 0830) Pulse Rate: 57 (02/25 0830)  Labs: Recent Labs    09/30/17 0401 09/30/17 0405 09/30/17 1435 10/01/17 0358 10/02/17 0500 10/02/17 0624 10/02/17 0748  HGB 7.1*  --  7.2* 7.2*  --   --  6.8*  HCT 22.5*  --  23.0* 23.7*  --   --  22.1*  PLT 100*  --  91* 86*  --   --  106*  HEPARINUNFRC  --  0.35  --   --  0.62  --   --   CREATININE 2.26*  --   --  3.10*  --  3.94*  --     Estimated Creatinine Clearance: 17.4 mL/min (A) (by C-G formula based on SCr of 3.94 mg/dL (H)).      Assessment: 62yo male had been on heparin gtt for Afib w/ RVR but had been taken off, now back in Afib, to resume anticoag; of note pt was on long-term anticoag in the past but was deemed to not be candidate due to GIB on Pradaxa. HD started 2/20 and VVS to see for AVG/AVF -heparin level is at goal on 1400 units/hr  Goal of Therapy:  Heparin level 0.3-0.7 units/ml Monitor platelets by anticoagulation protocol: Yes   Plan:  -no heparin changes needed -Daily heparin level and CBC -Follow up after OR  today  Thank you Anette Guarneri, PharmD [phone]  10/02/2017 8:57 AM

## 2017-10-02 NOTE — Progress Notes (Signed)
[facility] for heparin Indication: atrial fibrillation  Allergies  Allergen Reactions  . Penicillins Hives, Nausea And Vomiting, Swelling and Other (See Comments)    Tolerated Cefepime Has patient had a PCN reaction causing immediate rash, facial/tongue/throat swelling, SOB or lightheadedness with hypotension: YES Has patient had a PCN reaction causing severe rash involving mucus membranes or skin necrosis: No Has patient had a PCN reaction that required hospitalization No Has patient had a PCN reaction occurring within the last 10 years: No If all of the above answers are "NO", then may proceed with Cephalosporin use.     Patient Measurements: Height: 5' (152.4 cm) Weight: 184 lb 11.9 oz (83.8 kg) IBW/kg (Calculated) : 50 Heparin Dosing Weight: 70kg  Vital Signs: Temp: 97.3 F (36.3 C) (02/25 1450) Temp Source: Oral (02/25 1200) BP: 135/59 (02/25 1455) Pulse Rate: 60 (02/25 1458)  Labs: Recent Labs    09/30/17 0401 09/30/17 0405 09/30/17 1435 10/01/17 0358 10/02/17 0500 10/02/17 0624 10/02/17 0748  HGB 7.1*  --  7.2* 7.2*  --   --  6.8*  HCT 22.5*  --  23.0* 23.7*  --   --  22.1*  PLT 100*  --  91* 86*  --   --  106*  HEPARINUNFRC  --  0.35  --   --  0.62  --   --   CREATININE 2.26*  --   --  3.10*  --  3.94*  --     Estimated Creatinine Clearance: 17.2 mL/min (A) (by C-G formula based on SCr of 3.94 mg/dL (H)).      Assessment: 62yo male had been on heparin gtt for Afib w/ RVR but had been taken off, now back in Afib, to resume anticoag; of note pt was on long-term anticoag in the past but was deemed to not be candidate due to GIB on Pradaxa. HD started 2/20  Now s/p TDC placement, heparin to resume this evening   Goal of Therapy:  Heparin level 0.3-0.7 units/ml Monitor platelets by anticoagulation protocol: Yes   Plan:  Heparin at 1400 units / hr starting at 2100 pm Daily heparin level and CBC   Thank you Anette Guarneri, PharmD [phone]  10/02/2017 3:28 PM

## 2017-10-02 NOTE — Interval H&P Note (Signed)
History and Physical Interval Note:  10/02/2017 12:25 PM  [name]  has presented today for surgery, with the diagnosis of ESRD  The various methods of treatment have been discussed with the patient and family. After consideration of risks, benefits and other options for treatment, the patient has consented to  Procedure(s): INSERTION OF TUNNELED DIALYSIS CATHETER (N/A) as a surgical intervention .  The patient's history has been reviewed, patient examined, no change in status, stable for surgery.  I have reviewed the patient's chart and labs.  Questions were answered to the patient's satisfaction.     Ruta Hinds

## 2017-10-02 NOTE — H&P (View-Only) (Signed)
  Progress Note    10/02/2017 7:14 AM 17 Days Post-Op  Subjective:  No overnight issues  Vitals:   10/02/17 0048 10/02/17 0342  BP: (!) 142/60 139/64  Pulse:    Resp:    Temp: 98.8 F (37.1 C) 98.8 F (37.1 C)  SpO2: 99%     Physical Exam: aaox3 Non labored respirations Right ij catheter in place without erythema  CBC    Component Value Date/Time   WBC 8.0 10/01/2017 0358   RBC 2.81 (L) 10/01/2017 0358   HGB 7.2 (L) 10/01/2017 0358   HCT 23.7 (L) 10/01/2017 0358   PLT 86 (L) 10/01/2017 0358   MCV 84.3 10/01/2017 0358   MCH 25.6 (L) 10/01/2017 0358   MCHC 30.4 10/01/2017 0358   RDW 15.9 (H) 10/01/2017 0358   LYMPHSABS 1.6 10/05/2016 1155   MONOABS 0.6 10/05/2016 1155   EOSABS 0.3 10/05/2016 1155   BASOSABS 0.1 10/05/2016 1155    BMET    Component Value Date/Time   NA 136 10/01/2017 0358   NA 141 10/28/2016 1558   K 3.8 10/01/2017 0358   CL 98 (L) 10/01/2017 0358   CO2 27 10/01/2017 0358   GLUCOSE 105 (H) 10/01/2017 0358   BUN 43 (H) 10/01/2017 0358   BUN 52 (H) 10/28/2016 1558   CREATININE 3.10 (H) 10/01/2017 0358   CREATININE 1.46 (H) 08/04/2015 0828   CALCIUM 8.4 (L) 10/01/2017 0358   GFRNONAA 20 (L) 10/01/2017 0358   GFRAA 23 (L) 10/01/2017 0358    INR    Component Value Date/Time   INR 1.13 08/17/2015 0357     Intake/Output Summary (Last 24 hours) at 10/02/2017 0714 Last data filed at 10/02/2017 0344 Gross per 24 hour  Intake 400.7 ml  Output 470 ml  Net -69.3 ml     Assessment:  62 y.o. male is admitted with septic shock now resolved in need of tunneled catheter  Plan: OR today for conversion to tdc Will plan avf in the future if needed-vein mapping ordered   Rayvin Abid C. Donzetta Matters, MD [facility] Office: [phone] Pager: [phone]  10/02/2017 7:14 AM

## 2017-10-02 NOTE — Progress Notes (Signed)
Attempt to call report - PCXR complete

## 2017-10-02 NOTE — Progress Notes (Signed)
CRITICAL VALUE ALERT  Critical Value:  Hgb 6.8  Date & Time Notied:  10/02/17 0826  Provider Notified: [place]    Orders Received/Actions taken: 0840 orders received to transfuse 1 unit from another MD

## 2017-10-02 NOTE — Anesthesia Preprocedure Evaluation (Signed)
Anesthesia Evaluation  Patient identified by MRN, date of birth, ID band Patient awake    Reviewed: Allergy & Precautions, NPO status , Patient's Chart, lab work & pertinent test results  Airway Mallampati: II  TM Distance: >3 FB Neck ROM: Full    Dental  (+) Teeth Intact   Pulmonary COPD, former smoker,    breath sounds clear to auscultation       Cardiovascular hypertension, + CAD, + Past MI and + Peripheral Vascular Disease  + dysrhythmias Atrial Fibrillation  Rhythm:Irregular Rate:Normal  Echo 1/19  - The patient was in atrial fibrillation. Normal LV size with mild   LV hypertrophy. EF 40-45%. Mild-moderate RV dilation with mildly   decreased systolic function. Borderline pulmonary hypertension.   Neuro/Psych Anxiety  Neuromuscular disease CVA, Residual Symptoms    GI/Hepatic Neg liver ROS, GERD  ,  Endo/Other  diabetes, Type 2  Renal/GU Renal disease  negative genitourinary   Musculoskeletal negative musculoskeletal ROS (+)   Abdominal (+) + obese,   Peds negative pediatric ROS (+)  Hematology  (+) anemia ,   Anesthesia Other Findings   Reproductive/Obstetrics negative OB ROS                             Anesthesia Physical  Anesthesia Plan  ASA: IV  Anesthesia Plan: MAC   Post-op Pain Management:    Induction: Intravenous  PONV Risk Score and Plan: 1 and Treatment may vary due to age or medical condition  Airway Management Planned: Nasal Cannula, Natural Airway and Simple Face Mask  Additional Equipment:   Intra-op Plan:   Post-operative Plan:   Informed Consent: I have reviewed the patients History and Physical, chart, labs and discussed the procedure including the risks, benefits and alternatives for the proposed anesthesia with the patient or authorized representative who has indicated his/her understanding and acceptance.     Plan Discussed with: CRNA,  Anesthesiologist and Surgeon  Anesthesia Plan Comments:         Anesthesia Quick Evaluation

## 2017-10-02 NOTE — Op Note (Signed)
Procedure: Insertion of Palindrome catheter 23 cm right internal jugular vein, remove temporary dialysis catheter  Preoperative diagnosis: End-stage renal disease  Postoperative diagnosis: Same  Anesthesia: Local with IV sedation  Operative findings: 23 cm Diatek catheter right internal jugular vein  Operative details: After obtaining informed consent, the patient was taken to the operating room. The patient was placed in supine position on the operating room table. After adequate sedation the patient's entire neck and chest were prepped and draped in usual sterile fashion. The patient was placed in Trendelenburg position. Local anesthesia was infiltrated over the right jugular vein.  Using ultrasound I attempted to find a window to perform a separate stick for the new catheter.  Despite steep trendelenberg, the lumen of the IJ was 90% obscured by the existing catheter.  At this point I decided to do a wire exchange.  The catheter was clamped proximally and distally with hemostats and divided.  The stitch at the skin exit site was removed and the distal catheter removed with gentle traction and passed off the field.  The distal tip of the catheter was cannulated with an 0.035 J-tipped guidewire and threaded into the right internal jugular vein and into the superior vena cava followed by the right atrium under fluoroscopic guidance.   Next sequential 12 and 14 dilators were placed over the guidewire into the right atrium.  A 16 French dilator with a peel-away sheath was then placed over the guidewire into the right atrium.   The guidewire and dilator were removed. A 23 cm Palindrome catheter was then placed through the peel away sheath into the right atrium.  The catheter was then tunneled subcutaneously, cut to length, and the hub attached. The catheter was noted to flush and draw easily. The catheter was inspected under fluoroscopy and found with its tip to be in the right atrium without any kinks  throughout its course. The catheter was sutured to the skin with nylon sutures. The neck insertion site was closed with Vicryl stitch.  The catheter was then loaded with concentrated Heparin solution. A dry sterile dressing was applied.  The patient tolerated procedure well and there were no complications. Instrument sponge and needle counts correct in the case. The patient was taken to the recovery room in stable condition. Chest x-ray will be obtained in the recovery room.  Ruta Hinds, MD

## 2017-10-02 NOTE — Progress Notes (Signed)
Internal Medicine Attending  Date: 10/02/2017  Patient name: [name] Medical record number: [id number] Date of birth: 1955/10/09 Age: 62 y.o. Gender: male  I saw and evaluated the patient. I reviewed the resident's note by Dr. Trilby Drummer and I agree with the resident's findings and plans as documented in his progress note.  When seen on rounds this morning [name] was undergoing hemodialysis. He was without specific complaints. Examination revealed that he was quite pale. He was found to have a hemoglobin less than 7 on the morning labs and is therefore going to be transfused during hemodialysis. I suspect this is more related to anemia of chronic illness in the setting of acute renal failure. Although he has not been on anticoagulation secondary to a prior GI bleed, we will need to explore the specifics of this and make a decision on chronic anticoagulation prior to discharge. In the meantime, physical therapy will be important and we will ask for a recommendation on whether or not further rehabilitation in a skilled nursing facility may be appropriate.

## 2017-10-02 NOTE — Progress Notes (Signed)
Subjective: Patient seen while undergoing HD this morning. He is much more alert today than yesterday morning and states he is feeling better. He continues to feel tired but states he is not in pain and his breathing is improving as he works with RT. No further runs of RVR reported overnight. He does appear pale this AM and dialysis nurse reported hgb this morning 6.8. He has no other complaints or questions.   Objective:  Vital signs in last 24 hours: Vitals:   10/02/17 0830 10/02/17 0900 10/02/17 0930 10/02/17 1000  BP: 101/65 104/85 (!) 95/57 110/67  Pulse: (!) 57 (!) 104 60 73  Resp:      Temp:      TempSrc:      SpO2:      Weight:      Height:       Physical Exam  Constitutional: He appears well-developed.  Pale, chronically ill-appearing male  HENT:  Head: Normocephalic and atraumatic.  Eyes: EOM are normal. Right eye exhibits no discharge. Left eye exhibits no discharge.  Cardiovascular: Normal rate, regular rhythm, normal heart sounds and intact distal pulses.  Pulmonary/Chest: Effort normal and breath sounds normal.  Diffusely Rhonchorous  Abdominal: Soft. Bowel sounds are normal. He exhibits no distension. There is no tenderness.  Musculoskeletal: He exhibits no edema or deformity.  Neurological: He is alert.  Somulent, will rouse to sternal rub or should shaking. Mumbled some appropriate answers and rapidly feel back asleep  Skin: Skin is warm and dry. There is pallor.   Assessment/Plan:  PAF with RVR   Tachy-Brady Syndrome.  Currently in sinus rhythm. No RVR overnight, will transition back to POP Amiodarone. Awaiting placement of permanent pacemaker. - EP/Cardiology following-appreciate any recommendations. - He is not a candidate for long-term anticoagulation due to prior GI bleed on Pradaxa - Amiodarone likely transition back to PO today - Avoid metoprolol to prevent any bradycardia.  ARF/CRF.  Patient with minimal UOP. On intermittent HD. Cr today was 3.94.  Will be having tunneled catheter placed - Nephrology is following - Vascular to place tunneled dialysis catheter today  - Renal Function Panel in AM  Anemia.  Hemoglobin down trending this admission, 6.8 today in the setting of renal failure and history of GI Bleed. Patient on Heparin for A.fib. History of GI bleed with Pradaxa the past, has not been on chronic anticoagulation at home. On 2/23: Fe 17, Ferritin 87. Received  Feraheme 510mg  IV. - Will reassess benefit of continued anticoagulation, pending FOBT results - Transfuse 1Unit PRBC, Check CBC post transfusion - Getting Aranesp with nephrology - Check FOBT - CBC in AM  Influenza Pneumonia Respiratory Failure: Improving. Remains severely deconditioned after prolonged intubation.  Still requiring 3L of oxygen and rhonchirous on exam. He has Completed course of antibitoics and Tamiflu. - Will continue monitoring respiratory status - Incentive spirometry and flutter valve - Chest PT q4h - Continue DuoNeb - PT/OT evaluation  Dysphagia.  Continue dysphagia 2 diet with honey thick liquid.  DM. Current A1c on 09/28/17 was 7.9.  CBG remained between 80-130. - Continue Lantus 15 units at bedtime with SSI.  CAD. Severe 3 vessel by cath 04/2015 and CABG 2017.  Denies any chest pain. - Continue Lipitor. - No beta-blocker due to bradycardic episodes.  HFrEF: Stable. Current EF 40-45%.  No sign of acute exacerbation  Adrenal insufficiency: Continue fludrocortisone.  Dispo: Anticipated discharge pending placement of dialysis catheter and permanent pacemaker.  Neva Seat, MD 10/02/2017, 10:30 AM Pager:  [phone] 

## 2017-10-02 NOTE — Anesthesia Procedure Notes (Signed)
Procedure Name: MAC Date/Time: 10/02/2017 12:54 PM Performed by: Izora Gala, CRNA Pre-anesthesia Checklist: Patient identified, Emergency Drugs available, Suction available and Patient being monitored Placement Confirmation: positive ETCO2

## 2017-10-03 ENCOUNTER — Encounter (HOSPITAL_COMMUNITY): Payer: Self-pay | Admitting: Vascular Surgery

## 2017-10-03 ENCOUNTER — Inpatient Hospital Stay (HOSPITAL_COMMUNITY): Payer: Medicare (Managed Care)

## 2017-10-03 DIAGNOSIS — N170 Acute kidney failure with tubular necrosis: Secondary | ICD-10-CM

## 2017-10-03 DIAGNOSIS — Z9889 Other specified postprocedural states: Secondary | ICD-10-CM

## 2017-10-03 DIAGNOSIS — Z01810 Encounter for preprocedural cardiovascular examination: Secondary | ICD-10-CM

## 2017-10-03 DIAGNOSIS — R451 Restlessness and agitation: Secondary | ICD-10-CM

## 2017-10-03 DIAGNOSIS — D649 Anemia, unspecified: Secondary | ICD-10-CM

## 2017-10-03 LAB — CBC
HCT: 26.3 % — ABNORMAL LOW (ref 39.0–52.0)
Hemoglobin: 8.3 g/dL — ABNORMAL LOW (ref 13.0–17.0)
MCH: 27.1 pg (ref 26.0–34.0)
MCHC: 31.6 g/dL (ref 30.0–36.0)
MCV: 85.9 fL (ref 78.0–100.0)
PLATELETS: 115 10*3/uL — AB (ref 150–400)
RBC: 3.06 MIL/uL — ABNORMAL LOW (ref 4.22–5.81)
RDW: 16.5 % — AB (ref 11.5–15.5)
WBC: 6.5 10*3/uL (ref 4.0–10.5)

## 2017-10-03 LAB — RENAL FUNCTION PANEL
ALBUMIN: 2.5 g/dL — AB (ref 3.5–5.0)
Anion gap: 10 (ref 5–15)
BUN: 22 mg/dL — AB (ref 6–20)
CALCIUM: 8.6 mg/dL — AB (ref 8.9–10.3)
CHLORIDE: 100 mmol/L — AB (ref 101–111)
CO2: 27 mmol/L (ref 22–32)
CREATININE: 2.54 mg/dL — AB (ref 0.61–1.24)
GFR, EST AFRICAN AMERICAN: 29 mL/min — AB (ref >60)
GFR, EST NON AFRICAN AMERICAN: 25 mL/min — AB (ref >60)
Glucose, Bld: 105 mg/dL — ABNORMAL HIGH (ref 65–99)
PHOSPHORUS: 3.2 mg/dL (ref 2.5–4.6)
Potassium: 3.7 mmol/L (ref 3.5–5.1)
SODIUM: 137 mmol/L (ref 135–145)

## 2017-10-03 LAB — GLUCOSE, CAPILLARY
GLUCOSE-CAPILLARY: 107 mg/dL — AB (ref 65–99)
GLUCOSE-CAPILLARY: 79 mg/dL (ref 65–99)
GLUCOSE-CAPILLARY: 83 mg/dL (ref 65–99)
GLUCOSE-CAPILLARY: 84 mg/dL (ref 65–99)
Glucose-Capillary: 88 mg/dL (ref 65–99)
Glucose-Capillary: 90 mg/dL (ref 65–99)

## 2017-10-03 LAB — HEPARIN LEVEL (UNFRACTIONATED)
HEPARIN UNFRACTIONATED: 0.39 [IU]/mL (ref 0.30–0.70)
HEPARIN UNFRACTIONATED: 0.47 [IU]/mL (ref 0.30–0.70)

## 2017-10-03 LAB — PROTIME-INR
INR: 1.13
Prothrombin Time: 14.4 seconds (ref 11.4–15.2)

## 2017-10-03 MED ORDER — INSULIN ASPART 100 UNIT/ML ~~LOC~~ SOLN
0.0000 [IU] | Freq: Three times a day (TID) | SUBCUTANEOUS | Status: DC
  Administered 2017-10-12 – 2017-10-19 (×4): 3 [IU] via SUBCUTANEOUS
  Administered 2017-10-20: 4 [IU] via SUBCUTANEOUS
  Administered 2017-10-21: 7 [IU] via SUBCUTANEOUS
  Administered 2017-10-22: 4 [IU] via SUBCUTANEOUS
  Administered 2017-10-23: 3 [IU] via SUBCUTANEOUS
  Administered 2017-10-24: 4 [IU] via SUBCUTANEOUS

## 2017-10-03 MED ORDER — WARFARIN VIDEO: Freq: Once | Status: DC

## 2017-10-03 MED ORDER — INSULIN ASPART 100 UNIT/ML ~~LOC~~ SOLN: 0.0000 [IU] | Freq: Three times a day (TID) | SUBCUTANEOUS | Status: DC

## 2017-10-03 MED ORDER — COUMADIN BOOK
Freq: Once | Status: DC
  Filled 2017-10-03: qty 1

## 2017-10-03 MED ORDER — WARFARIN - PHARMACIST DOSING INPATIENT
Freq: Every day | Status: DC
  Administered 2017-10-03 – 2017-10-24 (×12)

## 2017-10-03 MED ORDER — HEPARIN (PORCINE) IN NACL 100-0.45 UNIT/ML-% IJ SOLN
1550.0000 [IU]/h | INTRAMUSCULAR | Status: DC
  Administered 2017-10-03 – 2017-10-04 (×2): 1400 [IU]/h via INTRAVENOUS
  Administered 2017-10-05 – 2017-10-07 (×2): 1550 [IU]/h via INTRAVENOUS
  Filled 2017-10-03 (×6): qty 250

## 2017-10-03 MED ORDER — WARFARIN SODIUM 3 MG PO TABS
6.0000 mg | ORAL_TABLET | Freq: Once | ORAL | Status: AC
  Administered 2017-10-03: 6 mg via ORAL
  Filled 2017-10-03: qty 2

## 2017-10-03 MED ORDER — AMIODARONE HCL 200 MG PO TABS
200.0000 mg | ORAL_TABLET | Freq: Two times a day (BID) | ORAL | Status: DC
  Administered 2017-10-03 – 2017-10-26 (×47): 200 mg via ORAL
  Filled 2017-10-03 (×47): qty 1

## 2017-10-03 MED ORDER — PAROXETINE HCL 20 MG PO TABS
40.0000 mg | ORAL_TABLET | Freq: Every day | ORAL | Status: DC
  Administered 2017-10-03 – 2017-10-26 (×24): 40 mg via ORAL
  Filled 2017-10-03 (×24): qty 2

## 2017-10-03 NOTE — Progress Notes (Signed)
ANTICOAGULATION CONSULT NOTE - Follow Up Consult  Pharmacy Consult for Heparin Indication: atrial fibrillation  Allergies  Allergen Reactions  . Penicillins Hives, Nausea And Vomiting, Swelling and Other (See Comments)    Tolerated Cefepime Has patient had a PCN reaction causing immediate rash, facial/tongue/throat swelling, SOB or lightheadedness with hypotension: YES Has patient had a PCN reaction causing severe rash involving mucus membranes or skin necrosis: No Has patient had a PCN reaction that required hospitalization No Has patient had a PCN reaction occurring within the last 10 years: No If all of the above answers are "NO", then may proceed with Cephalosporin use.     Patient Measurements: Height: 5' (152.4 cm) Weight: 183 lb 10.3 oz (83.3 kg) IBW/kg (Calculated) : 50 Heparin Dosing Weight:  68.7 kg  Vital Signs: Temp: 97.9 F (36.6 C) (02/26 1100) Temp Source: Oral (02/26 1100) BP: 147/61 (02/26 1100) Pulse Rate: 63 (02/26 1100)  Labs: Recent Labs    10/01/17 0358 10/02/17 0500 10/02/17 0624 10/02/17 0748 10/02/17 1611 10/03/17 0204 10/03/17 1149  HGB 7.2*  --   --  6.8* 8.0* 8.3*  --   HCT 23.7*  --   --  22.1* 24.7* 26.3*  --   PLT 86*  --   --  106* 104* 115*  --   HEPARINUNFRC  --  0.62  --   --   --  0.39 0.47  CREATININE 3.10*  --  3.94*  --   --  2.54*  --     Estimated Creatinine Clearance: 26.7 mL/min (A) (by C-G formula based on SCr of 2.54 mg/dL (H)).   Assessment: Anticoag: Xarelto 15mg  PTA for Afib, not addressed in H&P by teaching service, oddly cards note says pt is NOT on anticoag 2/2 GIB in past. Regardless plans are for [facility] once procedures completed (likely apixaban) -HL= 0.47 in goal. Hgb 8.3 stable. Plts 115 up. Start Coumadin tonight. Watch for Coumadin and INR interaction with Amiodarone  Goal of Therapy:  Heparin level 0.3-0.7 units/ml Monitor platelets by anticoagulation protocol: Yes   Plan:  - Continue IV heparin at 1400  units/hr bridging per Dr. Trilby Drummer. Daily HL and CBC. - Start Coumadin 6mg  po x 1 tonight. Daily INR - Coumadin book/video - Dr. Trilby Drummer ok to resume Paxil as PTA - f/u charting of Aranesp dose today (ordered with HD)   [name], PharmD, BCPS Clinical Staff Pharmacist Pager [phone]  [name] 10/03/2017,1:55 PM

## 2017-10-03 NOTE — Progress Notes (Signed)
Upper Extremity Vein Map   Right Cephalic  Segment Diameter Depth Comment  1. Axilla 2.56mm 12.61mm   2. Mid upper arm 3.8mm 8.70mm   3. Above AC 2.4mm 5.13mm   4. In [facility] 4.52mm 6.23mm IV site  5. Below AC mm mm Not imaged due to bandages  6. Mid forearm 2.44mm mm   7. Wrist 1.63mm mm    mm mm    mm mm    mm mm    Right Basilic  Segment Diameter Depth Comment  1. Axilla mm mm   2. Mid upper arm 2.37mm 18.26mm   3. Above [facility] 3.77mm 15.35mm branch  4. In [name] 4.37mm 8.63mm branch  5. Below AC mm mm   6. Mid forearm mm mm   7. Wrist mm mm    mm mm    mm mm    mm mm     Left Cephalic  Segment Diameter Depth Comment  1. Axilla 2.61mm 6.46mm   2. Mid upper arm 2.57mm 5.60mm   3. Above AC 2.14mm 4.74mm branch  4. In [facility] 2.87mm 3.6mm   5. Below AC 1.74mm mm branch  6. Mid forearm 1.35mm mm   7. Wrist mm mm    mm mm    mm mm    mm mm    Left Basilic  Segment Diameter Depth Comment  1. Axilla mm mm   2. Mid upper arm 3.38mm 19.28mm   3. Above [facility] 3.86mm 12.49mm   4. In [facility] 3.83mm 5.83mm branch  5. Below AC mm mm   6. Mid forearm mm mm   7. Wrist mm mm    mm mm    mm mm    mm mm     [name], RDMS, RVT

## 2017-10-03 NOTE — Progress Notes (Signed)
   tdc is in place and ok for use. Vein mapping ordered. Please contact if permanent access is needed and we can set up prior to discharge or as outpatient as needed.   Ardella Chhim C. Donzetta Matters, MD [facility] [place] Office: [phone] Pager: [phone]

## 2017-10-03 NOTE — Progress Notes (Addendum)
Telemetry reviewed Rhythm stable No plans for PPM at this time. Will arrange outpatient EP follow up  Electrophysiology team to see as needed while here. Please call with questions.  Chanetta Marshall, NP 10/03/2017 8:11 AM   EP Attending  Agree with above.   Mikle Bosworth.D.

## 2017-10-03 NOTE — Anesthesia Postprocedure Evaluation (Signed)
Anesthesia Post Note  Patient: [name]  Procedure(s) Performed: INSERTION OF TUNNELED DIALYSIS CATHETER (N/A Neck)     Patient location during evaluation: PACU Anesthesia Type: MAC Level of consciousness: awake and alert Pain management: pain level controlled Vital Signs Assessment: post-procedure vital signs reviewed and stable Respiratory status: spontaneous breathing, nonlabored ventilation, respiratory function stable and patient connected to nasal cannula oxygen Cardiovascular status: stable and blood pressure returned to baseline Postop Assessment: no apparent nausea or vomiting Anesthetic complications: no    Last Vitals:  Vitals:   10/03/17 1100 10/03/17 1610  BP: (!) 147/61 133/62  Pulse: 63 61  Resp: (!) 21 (!) 22  Temp: 36.6 C 36.5 C  SpO2: 96% 98%    Last Pain:  Vitals:   10/03/17 1610  TempSrc: Oral  PainSc:    Pain Goal: Patients Stated Pain Goal: 3 (09/29/17 1345)               Ciella Obi

## 2017-10-03 NOTE — Progress Notes (Signed)
ANTICOAGULATION CONSULT NOTE - Follow Up Consult  Pharmacy Consult for heparin Indication: atrial fibrillation  Labs: Recent Labs    09/30/17 0401 09/30/17 0405  10/01/17 0358 10/02/17 0500 10/02/17 0624 10/02/17 0748 10/02/17 1611 10/03/17 0204  HGB 7.1*  --    < > 7.2*  --   --  6.8* 8.0* 8.3*  HCT 22.5*  --    < > 23.7*  --   --  22.1* 24.7* 26.3*  PLT 100*  --    < > 86*  --   --  106* 104* 115*  HEPARINUNFRC  --  0.35  --   --  0.62  --   --   --  0.39  CREATININE 2.26*  --   --  3.10*  --  3.94*  --   --   --    < > = values in this interval not displayed.    Assessment/Plan:  62yo male therapeutic on heparin after resumed. Will continue gtt at current rate and confirm stable with additional level.   Wynona Neat, PharmD, BCPS  10/03/2017,3:44 AM

## 2017-10-03 NOTE — Progress Notes (Signed)
  Speech Language Pathology Treatment: Dysphagia  Patient Details Name: [name] MRN: [id number] DOB: 05/26/1956 Today's Date: 10/03/2017 Time: 1025-8527 SLP Time Calculation (min) (ACUTE ONLY): 17 min  Assessment / Plan / Recommendation Clinical Impression  Skilled treatment session focused on dysphagia goals. SLP facilitated session by providing skilled observation of pt consuming honey thick liquids via cup sips. SLP received pt reclined in bed with very wet vocal quality. Pt largely unaware of wet vocal quality. When cued to cough and reswallow pt able to clear wetness.  Pt with appearance of timely swallow and required cued throat clear and cough. Recommend continuing honey thick liquids. Will continue to follow for possibility of further diet upgrade.    HPI HPI: 62 yo M with History of paroxysmal A. Fib, DM, CHF, CAD, CABG 2017, carotid stenosis (S/P L Carotid Endarterectomy), GERD, CVA, COPD, HTN, Abdominal Hernia, and Diabetes presented  generalized weakness and was found to have flu and was in A. Fib with RVR. Developed AKI requiring transient CRRT. Acute hypoxemic respiratory failure with bilateral pulmonary infiltrates. Intubated 2/12-2/21. CXR 2/22 increasing interstitial prominence, likely interstitial edema. Increasing bibasilar atelectasis. BSE 08/12/16 primary esophageal dysphagia. Esophagram same day no esophageal obstruction or stricture. Presbyesophagus. penetration of barium to the level of the vocal cords occurred during the exam and did elicit intermittent coughing. Recommend followup speech pathology evaluation to evaluate for aspiration risk.      SLP Plan  Continue with current plan of care       Recommendations  Diet recommendations: Dysphagia 2 (fine chop);Honey-thick liquid Liquids provided via: Teaspoon;Cup Medication Administration: Crushed with puree Supervision: Patient able to self feed;Staff to assist with self feeding Compensations: Minimize  environmental distractions;Slow rate;Small sips/bites;Clear throat intermittently Postural Changes and/or Swallow Maneuvers: Seated upright 90 degrees                Oral Care Recommendations: Oral care BID Follow up Recommendations: (TBD) SLP Visit Diagnosis: Dysphagia, pharyngeal phase (R13.13) Plan: Continue with current plan of care       GO                [name] 10/03/2017, 5:30 PM

## 2017-10-03 NOTE — Progress Notes (Signed)
Subjective: Interval History: has no complaint .  Objective: Vital signs in last 24 hours: Temp:  [97.3 F (36.3 C)-98 F (36.7 C)] 97.8 F (36.6 C) (02/26 0816) Pulse Rate:  [58-100] 60 (02/26 0816) Resp:  [12-26] 23 (02/26 0816) BP: (88-159)/(51-74) 159/62 (02/26 0816) SpO2:  [92 %-100 %] 95 % (02/26 0816) Weight:  [83.3 kg (183 lb 10.3 oz)-83.8 kg (184 lb 11.9 oz)] 83.3 kg (183 lb 10.3 oz) (02/26 0345) Weight change: 0.7 kg (1 lb 8.7 oz)  Intake/Output from previous day: 02/25 0701 - 02/26 0700 In: 2024 [P.O.:322; I.V.:1387; Blood:315] Out: 3518 [Urine:775; Blood:5] Intake/Output this shift: Total I/O In: 120 [P.O.:120] Out: -   General appearance: cooperative, moderately obese, pale and slowed mentation Resp: rales bibasilar and rhonchi bibasilar Chest wall: RIJ PC Cardio: S1, S2 normal and systolic murmur: systolic ejection 2/6, decrescendo at 2nd left intercostal space GI: obese, mild distension,soft Extremities: edema 2-3+  Lab Results: Recent Labs    10/02/17 1611 10/03/17 0204  WBC 5.7 6.5  HGB 8.0* 8.3*  HCT 24.7* 26.3*  PLT 104* 115*   BMET:  Recent Labs    10/02/17 0624 10/03/17 0204  NA 136 137  K 3.8 3.7  CL 97* 100*  CO2 27 27  GLUCOSE 98 105*  BUN 50* 22*  CREATININE 3.94* 2.54*  CALCIUM 8.4* 8.6*   No results for input(s): PTH in the last 72 hours. Iron Studies:  Recent Labs    09/30/17 1435  IRON 17*  TIBC 133*  FERRITIN 87    Studies/Results: Dg Chest 1 View  Result Date: 10/02/2017 CLINICAL DATA:  S/P INSERTION OF TUNNELED DIALYSIS CATHETER EXAM: CHEST 1 VIEW COMPARISON:  Chest x-ray dated 09/30/2017. FINDINGS: Dialysis catheter appears well positioned with tip overlying the right atrium. No pneumothorax seen. Cardiomegaly is stable. Atherosclerotic changes noted at the aortic arch. Interstitial prominence noted bilaterally, increased compared to the previous study, suggesting interstitial edema. Increased opacities are seen at  the right lung base, asymmetric alveolar pulmonary edema versus atelectasis. Also small right pleural effusion. IMPRESSION: 1. Dialysis catheter appears well positioned with tip at the level of the right atrium. No pneumothorax. 2. Increased opacity at the right lung base, most likely pulmonary edema related to CHF/volume overload, alternatively atelectasis or aspiration. 3. Small right pleural effusion. 4. Bilateral interstitial prominence, presumed interstitial edema, increased compared to the previous study of 09/30/2017, again most likely related to CHF/volume overload. Electronically Signed   By: Franki Cabot M.D.   On: 10/02/2017 14:32   Dg Fluoro Guide Cv Line-no Report  Result Date: 10/02/2017 Fluoroscopy was utilized by the requesting physician.  No radiographic interpretation.    I have reviewed the patient's current medications.  Assessment/Plan: 1 AKI did make some more urine yest.  Will check am chem prior to HD.  Still vol xs.  2 Anemia esa/Fe, had Tx 3 Obesity 4 DM 5 Sepsis/flu stable 6 Tachybrady stable 7 Debill 8CAD 9 COPD P HD depending on chem and urine, esa/Fe, DM control   LOS: 18 days   Muzammil Bruins 10/03/2017,10:00 AM

## 2017-10-03 NOTE — Progress Notes (Signed)
Subjective: Patient seen resting in his bed this morning. Nursing reports that patient became agitated and confused overnight and had been pulling off his clothes and oxygen. He was alert this morning and answering questions appropriately He state that he still feels tired, but is other wise feeling better. He denies pain or shortness of breath. Normal sinus rhythm overnight. Pallor improved today following transfusion yesterday. No further questions or complaints today.  Objective:  Vital signs in last 24 hours: Vitals:   10/02/17 2341 10/03/17 0345 10/03/17 0816 10/03/17 1100  BP: (!) 147/51 (!) 155/63 (!) 159/62 (!) 147/61  Pulse: 63 63 60 63  Resp: 19 20 (!) 23 (!) 21  Temp: 98 F (36.7 C) 97.7 F (36.5 C) 97.8 F (36.6 C) 97.9 F (36.6 C)  TempSrc: Oral Oral Oral Oral  SpO2: 100% 92% 95% 96%  Weight:  183 lb 10.3 oz (83.3 kg)    Height:       Physical Exam  Constitutional: He appears well-developed.  Chronically ill-appearing male  HENT:  Head: Normocephalic and atraumatic.  Eyes: EOM are normal. Right eye exhibits no discharge. Left eye exhibits no discharge.  Cardiovascular: Normal rate, regular rhythm, normal heart sounds and intact distal pulses.  Pulmonary/Chest: Effort normal.  Diffusely Rhonchorous  Abdominal: Soft. Bowel sounds are normal. He exhibits no distension. There is no tenderness.  Musculoskeletal: He exhibits no edema or deformity.  Neurological: He is alert.  Skin: Skin is warm and dry.   Assessment/Plan:  PAF with RVR Tachy-Brady Syndrome.  Currently in sinus rhythm. No RVR last 48 hrs, will transition back to PO Amiodarone. EP will not be placing permanent pacemaker this admission per their note, patient is to follow up with the as an outpatient. 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 Will resume anticoagulation with warfarin as patient has Acute on Chronic Renal Failure at this time. - EP/Cardiology following, appreciate any recommendations - Avoid metoprolol to prevent any bradycardia. - Amiodarone likely transition back to PO today - Discontinue heparin and start Warfarin for anticoagulation  Acute on Chronic Renal Failure.  Increased UOP, but still minimal. On intermittent HD. Tunneled dialysis cather placed 2/25. - Nephrology is following - Renal Function Panel in AM  Anemia.  Hemoglobin 8.3 following transfusion of 1 Unit yesterday. Patient transitioning to warfarin for chronic anticoagulation in the setting of Paroxysmal A.fib. On 2/23: Fe 17, Ferritin 87 >> Received Feraheme 510mg  IV (has a history of serial Feraheme infusion, may need to resume this on discharge). - Getting Aranesp with nephrology - Check FOBT - CBC in AM  Agitation: Patient reportedly becoming agitated overnight pulling off oxygen and clothes. - Will minimize nighttime disruptions and promote wakefulness in the daytime to get patient on a more normal day night schedule.  Dysphagia.  Continue dysphagia 2 diet with honey thick liquid.  Influenza Pneumonia Respiratory Failure: Improving. Remains severely deconditioned after prolonged intubation.  Still requiring 2L of oxygen and rhonchirous on exam. He has Completed course of antibitoics and Tamiflu. - Will continue monitoring respiratory status - Incentive spirometry and flutter valve - Chest PT - Continue DuoNeb - PT/OT evaluation  DM. Stable. Current A1c on 09/28/17 was 7.9.  CBG remained between 80-120. - Continue Lantus 15 units at bedtime with SSI.  Dispo: Anticipated discharge pending clinical course and placement needs.  Neva Seat, MD 10/03/2017, 11:53 AM Pager: [phone]

## 2017-10-03 NOTE — Progress Notes (Signed)
Internal Medicine Attending  Date: 10/03/2017  Patient name: [name] Medical record number: [id number] Date of birth: June 09, 1956 Age: 62 y.o. Gender: male  I saw and evaluated the patient. I reviewed the resident's note by Dr. Trilby Drummer and I agree with the resident's findings and plans as documented in his progress note.  When seen on rounds this morning [name] was without complaints other than generalized fatigue. Apparently, he had some episode of confusion overnight where he was pulling off his gown as well as his oxygen. He's on no medications that would impact his mental status, and thus this is likely some delirium related to a lack of sleep and his prolonged hospital stay. We will work hard on improving his day-night schedule and minimize interruptions to his sleep overnight. During the day we will make sure his shades are raised so that he senses it is light outside. There are signs that his urine output is improving and he may recover from his acute tubular necrosis related to his hypotension upon admission. In the meantime, we are continuing hemodialysis as per nephrology. Dr. Trilby Drummer reviewed the medical record and has determined that the plan was to restart oral anticoagulation once his hematocrit had recovered. Therefore, we are starting oral anticoagulation at this time as we believe further invasive procedures are unlikely. In addition to working on his day-night schedule we will also work on improving his functional status by beginning work with physical therapy. Ultimately, I believe he will require skilled nursing facility placement for further rehabilitation after this prolonged hospitalization.

## 2017-10-03 NOTE — Evaluation (Addendum)
Physical Therapy Evaluation Patient Details Name: [name] MRN: [id number] DOB: 30-May-1956 Today's Date: 10/03/2017   History of Present Illness  Pt is a 62 y.o. male admitted 09/15/17 with generalized weakness, cough, and dizziness; found to be back in a-fib with rapid rate. S/p temporary pacemaker placement 2/8; electrophysiology avoiding PPM implant due to pt's co-morbidities. Intubated 2/12-2/21. Also with acute on chronic renal failure; s/p R IJ tunnelled dialysis cath placement 10/02/17. PMH includes CHF, CAD (s/p CABG 2017), carotid stenosis (s/p L carotid endarterectomy), CVA, COPD, HTN, DM, intellectual disability.     Clinical Impression  Pt presents with an overall decrease in functional mobility secondary to above. Unsure if pt reliable historian. Reports mod indep with RW for amb and ADLs. Today, pt required max-totalA for bed mobility. Unable to achieve standing with RW despite maxA. Pt moves well initially in bed, but when it comes to mobilizing, requests max assist and unable/unwilling(?) to provide much assistance himself. Easily fatigued with generalized weakness and decreased activity tolerance; increased time and effort for all movement. Pt would benefit from continued acute PT services to maximize functional mobility and independence prior to d/c with SNF-level therapies.     Follow Up Recommendations SNF;Supervision/Assistance - 24 hour    Equipment Recommendations  (TBD next venue)    Recommendations for Other Services       Precautions / Restrictions Precautions Precautions: Fall Precaution Comments: s/p RIJ tunnelled cath 2/25 Restrictions Weight Bearing Restrictions: No      Mobility  Bed Mobility Overal bed mobility: Needs Assistance Bed Mobility: Supine to Sit;Sit to Supine     Supine to sit: Mod assist Sit to supine: Max assist   General bed mobility comments: ModA to assist BLEs to EOB and trunk elevation; pt able to move well in bed, but when  it comes to sitting up, asking for assist and unable to mobilize much himself despite max encouragement. MaxA to return to supine; totalA(+2) to scoot up in bed  Transfers Overall transfer level: Needs assistance Equipment used: Rolling walker (2 wheeled) Transfers: Sit to/from Stand;Lateral/Scoot Transfers Sit to Stand: Max assist        Lateral/Scoot Transfers: Max assist General transfer comment: Pt unable to achieve fully upright standing with RW and maxA; pt very weak, but also unsure if he is providing as much assistance as possible with mobility. MaxA to begin lateral scoot transfer from bed to drop arm chair, but pt eventually fatiguing, not assisting at all, and requesting return to supine  Ambulation/Gait             General Gait Details: Not attempted this session secondary to pt fatigue and amount of assist needed  Stairs            Wheelchair Mobility    Modified Rankin (Stroke Patients Only)       Balance Overall balance assessment: Needs assistance Sitting-balance support: No upper extremity supported;Feet supported Sitting balance-Leahy Scale: Fair Sitting balance - Comments: Dependent to don socks seated EOB     Standing balance-Leahy Scale: Zero                               Pertinent Vitals/Pain Pain Assessment: No/denies pain    Home Living Family/patient expects to be discharged to:: Private residence Living Arrangements: Other relatives Available Help at Discharge: Family;Available PRN/intermittently Type of Home: House Home Access: Stairs to enter     Home Layout: Two level;Able  to live on main level with bedroom/bathroom Home Equipment: Gilford Rile - 2 wheels;Cane - single point Additional Comments: Pt poor historian, unclear if he lives with sister or sisters live nearby. Per RN, pt lives with sister    Prior Function Level of Independence: Independent with assistive device(s)         Comments: Pt poor historian, but  reports mod indep with RW or SPC at home and in community. Per RN, sister states he was able to cook for himself and perform ADLs     Hand Dominance        Extremity/Trunk Assessment   Upper Extremity Assessment Upper Extremity Assessment: Generalized weakness    Lower Extremity Assessment Lower Extremity Assessment: Generalized weakness; R foot transmet amputation       Communication      Cognition Arousal/Alertness: Awake/alert Behavior During Therapy: Flat affect Overall Cognitive Status: History of cognitive impairments - at baseline Area of Impairment: Attention;Memory;Following commands;Safety/judgement;Awareness;Problem solving                   Current Attention Level: Sustained Memory: Decreased short-term memory Following Commands: Follows one step commands inconsistently Safety/Judgement: Decreased awareness of deficits Awareness: Emergent Problem Solving: Slow processing;Decreased initiation;Requires verbal cues;Requires tactile cues General Comments: Pt not consistently following commands with decreased ability to provide assistance with mobility; unsure if this is baseline cognitive deficit or just pt's unwillingness to mobilize      General Comments      Exercises     Assessment/Plan    PT Assessment Patient needs continued PT services  PT Problem List Decreased strength;Decreased activity tolerance;Decreased balance;Decreased mobility;Decreased cognition;Decreased knowledge of use of DME       PT Treatment Interventions DME instruction;Gait training;Functional mobility training;Therapeutic activities;Therapeutic exercise;Balance training;Patient/family education;Wheelchair mobility training    PT Goals (Current goals can be found in the Care Plan section)  Acute Rehab PT Goals Patient Stated Goal: Get stronger PT Goal Formulation: With patient Time For Goal Achievement: 10/17/17 Potential to Achieve Goals: Good    Frequency Min  2X/week   Barriers to discharge        Co-evaluation               AM-PAC PT "6 Clicks" Daily Activity  Outcome Measure Difficulty turning over in bed (including adjusting bedclothes, sheets and blankets)?: Unable Difficulty moving from lying on back to sitting on the side of the bed? : Unable Difficulty sitting down on and standing up from a chair with arms (e.g., wheelchair, bedside commode, etc,.)?: Unable Help needed moving to and from a bed to chair (including a wheelchair)?: Total Help needed walking in hospital room?: Total Help needed climbing 3-5 steps with a railing? : Total 6 Click Score: 6    End of Session Equipment Utilized During Treatment: Gait belt;Oxygen Activity Tolerance: Patient limited by fatigue Patient left: in bed;with call bell/phone within reach;with bed alarm set;with nursing/sitter in room Nurse Communication: Mobility status PT Visit Diagnosis: Other abnormalities of gait and mobility (R26.89);Muscle weakness (generalized) (M62.81);Difficulty in walking, not elsewhere classified (R26.2)    Time: 0981-1914 PT Time Calculation (min) (ACUTE ONLY): 24 min   Charges:   PT Evaluation $PT Eval Moderate Complexity: 1 Mod PT Treatments $Therapeutic Activity: 8-22 mins   PT G Codes:       Mabeline Caras, PT, DPT Acute Rehab Services  Pager: [place] 10/03/2017, 2:51 PM

## 2017-10-03 NOTE — Progress Notes (Signed)
@  2007 paged IMTS on-call regarding pt's return to Afib RVR (HR 100-120s), Somnolence (arousable to light physical touch), and increased O2 demand (requiring Venti @55 %).  @2010  IMTS returned paged and deferred to Cardiology  @2026  Cardiology Paged regarding above  @2035  Cardiology returned page. Endorsed to hold resuming Amio gtt but to to re-page if pt's HR begain sustaining in 130s. Concurrently pt's O2 demand returned to baseline.  In early AM pt converted back to NSR and returned to baseline LOC. Will continue to monitor.

## 2017-10-04 LAB — RENAL FUNCTION PANEL
Albumin: 2.7 g/dL — ABNORMAL LOW (ref 3.5–5.0)
Anion gap: 13 (ref 5–15)
BUN: 32 mg/dL — ABNORMAL HIGH (ref 6–20)
CHLORIDE: 99 mmol/L — AB (ref 101–111)
CO2: 25 mmol/L (ref 22–32)
CREATININE: 3.73 mg/dL — AB (ref 0.61–1.24)
Calcium: 8.8 mg/dL — ABNORMAL LOW (ref 8.9–10.3)
GFR calc non Af Amer: 16 mL/min — ABNORMAL LOW (ref >60)
GFR, EST AFRICAN AMERICAN: 18 mL/min — AB (ref >60)
Glucose, Bld: 106 mg/dL — ABNORMAL HIGH (ref 65–99)
Phosphorus: 4.2 mg/dL (ref 2.5–4.6)
Potassium: 3.8 mmol/L (ref 3.5–5.1)
Sodium: 137 mmol/L (ref 135–145)

## 2017-10-04 LAB — CBC
HCT: 27.5 % — ABNORMAL LOW (ref 39.0–52.0)
Hemoglobin: 8.6 g/dL — ABNORMAL LOW (ref 13.0–17.0)
MCH: 27.6 pg (ref 26.0–34.0)
MCHC: 31.3 g/dL (ref 30.0–36.0)
MCV: 88.1 fL (ref 78.0–100.0)
PLATELETS: 140 10*3/uL — AB (ref 150–400)
RBC: 3.12 MIL/uL — AB (ref 4.22–5.81)
RDW: 17.8 % — AB (ref 11.5–15.5)
WBC: 6.3 10*3/uL (ref 4.0–10.5)

## 2017-10-04 LAB — GLUCOSE, CAPILLARY
GLUCOSE-CAPILLARY: 137 mg/dL — AB (ref 65–99)
GLUCOSE-CAPILLARY: 94 mg/dL (ref 65–99)
GLUCOSE-CAPILLARY: 107 mg/dL — AB (ref 65–99)
Glucose-Capillary: 101 mg/dL — ABNORMAL HIGH (ref 65–99)
Glucose-Capillary: 96 mg/dL (ref 65–99)

## 2017-10-04 LAB — HEPARIN LEVEL (UNFRACTIONATED)
HEPARIN UNFRACTIONATED: 0.22 [IU]/mL — AB (ref 0.30–0.70)
HEPARIN UNFRACTIONATED: 0.52 [IU]/mL (ref 0.30–0.70)

## 2017-10-04 LAB — PROTIME-INR
INR: 1.06
Prothrombin Time: 13.7 seconds (ref 11.4–15.2)

## 2017-10-04 MED ORDER — WARFARIN SODIUM 3 MG PO TABS
6.0000 mg | ORAL_TABLET | Freq: Once | ORAL | Status: AC
  Administered 2017-10-04: 6 mg via ORAL
  Filled 2017-10-04: qty 2

## 2017-10-04 MED ORDER — HEPARIN SODIUM (PORCINE) 1000 UNIT/ML DIALYSIS: 1000.0000 [IU] | INTRAMUSCULAR | Status: DC | PRN

## 2017-10-04 MED ORDER — LIDOCAINE HCL (PF) 1 % IJ SOLN: 5.0000 mL | INTRAMUSCULAR | Status: DC | PRN

## 2017-10-04 MED ORDER — FERROUS SULFATE 325 (65 FE) MG PO TABS
325.0000 mg | ORAL_TABLET | Freq: Every day | ORAL | Status: DC
  Administered 2017-10-05 – 2017-10-06 (×2): 325 mg via ORAL
  Filled 2017-10-04 (×2): qty 1

## 2017-10-04 MED ORDER — SODIUM CHLORIDE 0.9 % IV SOLN: 100.0000 mL | INTRAVENOUS | Status: DC | PRN

## 2017-10-04 MED ORDER — LIDOCAINE-PRILOCAINE 2.5-2.5 % EX CREA: 1.0000 "application " | TOPICAL_CREAM | CUTANEOUS | Status: DC | PRN

## 2017-10-04 MED ORDER — HEPARIN SODIUM (PORCINE) 1000 UNIT/ML DIALYSIS
100.0000 [IU]/kg | INTRAMUSCULAR | Status: DC | PRN
  Filled 2017-10-04: qty 9

## 2017-10-04 MED ORDER — DARBEPOETIN ALFA 150 MCG/0.3ML IJ SOSY
150.0000 ug | PREFILLED_SYRINGE | INTRAMUSCULAR | Status: DC
  Administered 2017-10-04: 150 ug via INTRAVENOUS
  Filled 2017-10-04 (×2): qty 0.3

## 2017-10-04 MED ORDER — ALTEPLASE 2 MG IJ SOLR: 2.0000 mg | Freq: Once | INTRAMUSCULAR | Status: DC | PRN

## 2017-10-04 MED ORDER — DARBEPOETIN ALFA 150 MCG/0.3ML IJ SOSY
PREFILLED_SYRINGE | INTRAMUSCULAR | Status: AC
  Administered 2017-10-04: 150 ug via INTRAVENOUS
  Filled 2017-10-04: qty 0.3

## 2017-10-04 MED ORDER — PENTAFLUOROPROP-TETRAFLUOROETH EX AERO: 1.0000 "application " | INHALATION_SPRAY | CUTANEOUS | Status: DC | PRN

## 2017-10-04 NOTE — Progress Notes (Signed)
Subjective: Interval History: has no complaint .  Objective: Vital signs in last 24 hours: Temp:  [97.4 F (36.3 C)-97.9 F (36.6 C)] 97.6 F (36.4 C) (02/27 0305) Pulse Rate:  [59-115] 60 (02/27 0610) Resp:  [15-26] 23 (02/27 0610) BP: (92-147)/(53-68) 132/58 (02/27 0305) SpO2:  [81 %-100 %] 100 % (02/27 0840) FiO2 (%):  [30 %-55 %] 30 % (02/26 2005) Weight:  [85.2 kg (187 lb 13.3 oz)] 85.2 kg (187 lb 13.3 oz) (02/27 0305) Weight change: -1.1 kg (-6.8 oz)  Intake/Output from previous day: 02/26 0701 - 02/27 0700 In: 904.7 [P.O.:540; I.V.:364.7] Out: 1200 [Urine:1200] Intake/Output this shift: No intake/output data recorded.  General appearance: alert, cooperative, no distress, moderately obese, pale and chronically ill Resp: rhonchi bilaterally Chest wall: IJ PC Cardio: S1, S2 normal and systolic murmur: systolic ejection 2/6, decrescendo at 2nd left intercostal space GI: obese, pos bs, soft Extremities: edema 2-3+  Lab Results: Recent Labs    10/02/17 1611 10/03/17 0204  WBC 5.7 6.5  HGB 8.0* 8.3*  HCT 24.7* 26.3*  PLT 104* 115*   BMET:  Recent Labs    10/02/17 0624 10/03/17 0204  NA 136 137  K 3.8 3.7  CL 97* 100*  CO2 27 27  GLUCOSE 98 105*  BUN 50* 22*  CREATININE 3.94* 2.54*  CALCIUM 8.4* 8.6*   No results for input(s): PTH in the last 72 hours. Iron Studies: No results for input(s): IRON, TIBC, TRANSFERRIN, FERRITIN in the last 72 hours.  Studies/Results: Dg Chest 1 View  Result Date: 10/02/2017 CLINICAL DATA:  S/P INSERTION OF TUNNELED DIALYSIS CATHETER EXAM: CHEST 1 VIEW COMPARISON:  Chest x-ray dated 09/30/2017. FINDINGS: Dialysis catheter appears well positioned with tip overlying the right atrium. No pneumothorax seen. Cardiomegaly is stable. Atherosclerotic changes noted at the aortic arch. Interstitial prominence noted bilaterally, increased compared to the previous study, suggesting interstitial edema. Increased opacities are seen at the  right lung base, asymmetric alveolar pulmonary edema versus atelectasis. Also small right pleural effusion. IMPRESSION: 1. Dialysis catheter appears well positioned with tip at the level of the right atrium. No pneumothorax. 2. Increased opacity at the right lung base, most likely pulmonary edema related to CHF/volume overload, alternatively atelectasis or aspiration. 3. Small right pleural effusion. 4. Bilateral interstitial prominence, presumed interstitial edema, increased compared to the previous study of 09/30/2017, again most likely related to CHF/volume overload. Electronically Signed   By: Franki Cabot M.D.   On: 10/02/2017 14:32   Dg Fluoro Guide Cv Line-no Report  Result Date: 10/02/2017 Fluoroscopy was utilized by the requesting physician.  No radiographic interpretation.    I have reviewed the patient's current medications.  Assessment/Plan: 1 AKI now nonoliguric.  Still vol xs Chem pending.  If not better will plan Hd as vol an issue.  BP limits removal much. 2 Anemia esa/Fe 3 DM 4 Debill 5 Pneu/sepsis  6 COPD xs secretions 7 Tachybrady  Stable P check chem, Probable Hd, esa, follow urine , chem   LOS: 19 days   [name] 10/04/2017,9:37 AM

## 2017-10-04 NOTE — Clinical Social Work Note (Signed)
Clinical Social Work Assessment  Patient Details  Name: [name] MRN: [id number] Date of Birth: [birth year]  Date of referral:  10/04/17               Reason for consult:  Discharge Planning, Facility Placement                Permission sought to share information with:  Family Supports Permission granted to share information::  Yes, Verbal Permission Granted  Name::     [name]  Agency::  snf  Relationship::  sister  Contact Information:  [phone]  Housing/Transportation Living arrangements for the past 2 months:  [place] of Information:  Patient Patient Interpreter Needed:  None Criminal Activity/Legal Involvement Pertinent to Current Situation/Hospitalization:  No - Comment as needed Significant Relationships:  Other Family Members, Siblings Lives with:  Siblings Do you feel safe going back to the place where you live?  Yes Need for family participation in patient care:  Yes (Comment)  Care giving concerns:  No family at bedside. Patient stated he lives with his older sister and her boyfriend. Patient stated he has support from other siblings    Facilities manager / plan:  CSW met patient at bedside to discuss disposition plan. Patient stated he has been at [facility] in the past and would like to return for rehab.  Employment status:  Retired Forensic scientist:  Other (Comment Required)([employer]) PT Recommendations:  [place] / Referral to community resources:  [place]  Patient/Family's Response to care:  Patient stated he would like to go to rehab for a couple of weeks to get his strength back up before going home to family.  Patient/Family's Understanding of and Emotional Response to Diagnosis, Current Treatment, and Prognosis:  CSW to follow up with patient once bed offers are available   Emotional Assessment Appearance:  Appears older than stated  age Attitude/Demeanor/Rapport:  Other Affect (typically observed):  Accepting Orientation:  Oriented to Situation, Oriented to Self, Oriented to Place, Oriented to  Time Alcohol / Substance use:  Not Applicable Psych involvement (Current and /or in the community):  No (Comment)  Discharge Needs  Concerns to be addressed:  No discharge needs identified Readmission within the last 30 days:  No Current discharge risk:  None Barriers to Discharge:  No Barriers Identified   Wende Neighbors, LCSW 10/04/2017, 3:18 PM

## 2017-10-04 NOTE — Progress Notes (Addendum)
ANTICOAGULATION CONSULT NOTE - Follow Up Consult  Pharmacy Consult for Heparin/Coumadin Indication: atrial fibrillation  Allergies  Allergen Reactions  . Penicillins Hives, Nausea And Vomiting, Swelling and Other (See Comments)    Tolerated Cefepime Has patient had a PCN reaction causing immediate rash, facial/tongue/throat swelling, SOB or lightheadedness with hypotension: YES Has patient had a PCN reaction causing severe rash involving mucus membranes or skin necrosis: No Has patient had a PCN reaction that required hospitalization No Has patient had a PCN reaction occurring within the last 10 years: No If all of the above answers are "NO", then may proceed with Cephalosporin use.     Patient Measurements: Height: 5' (152.4 cm) Weight: 187 lb 13.3 oz (85.2 kg) IBW/kg (Calculated) : 50 Heparin Dosing Weight: 68.7 kg   Vital Signs: Temp: 97.6 F (36.4 C) (02/27 0305) Temp Source: Oral (02/27 0305) BP: 139/56 (02/27 1224) Pulse Rate: 64 (02/27 1224)  Labs: Recent Labs    10/02/17 0624  10/02/17 1611 10/03/17 0204 10/03/17 1149 10/03/17 1545 10/04/17 1117  HGB  --    < > 8.0* 8.3*  --   --  8.6*  HCT  --    < > 24.7* 26.3*  --   --  27.5*  PLT  --    < > 104* 115*  --   --  140*  LABPROT  --   --   --   --   --  14.4 13.7  INR  --   --   --   --   --  1.13 1.06  HEPARINUNFRC  --   --   --  0.39 0.47  --  0.22*  CREATININE 3.94*  --   --  2.54*  --   --  3.73*   < > = values in this interval not displayed.    Estimated Creatinine Clearance: 18.4 mL/min (A) (by C-G formula based on SCr of 3.73 mg/dL (H)).  Assessment:  Anticoag: Xarelto 15mg  PTA for Afib (on med rec), not addressed in H&P by teaching service, oddly cards note says pt is NOT on anticoag 2/2 GIB in past. -HL= 0.22 less than goal. Hgb 8.6 stable. Plts 140 up. INR 1.06. Started Coumadin. Watch for Coumadin and INR interaction with Amiodarone  Goal of Therapy:  Heparin level 0.3-0.7 units/ml  INR  2-3 Monitor platelets by anticoagulation protocol: Yes   Plan:  - Increase IV heparin to 1550 units/hr. Recheck level in 6 hrs - Daily HL and CBC. - Coumadin 6mg  po x 1 tonight. Daily INR - Rescheduled Aranesp dose for today with HD. HD RN aware  Abrial Arrighi S. Alford Highland, PharmD, BCPS Clinical Staff Pharmacist Pager [phone]  Eilene Ghazi Stillinger 10/04/2017,2:58 PM

## 2017-10-04 NOTE — Progress Notes (Signed)
Internal Medicine Attending  Date: 10/04/2017  Patient name: [name] Medical record number: [id number] Date of birth: [birth year] Age: 62 y.o. Gender: male  I saw and evaluated the patient. I reviewed the resident's note by Dr. Trilby Drummer and I agree with the resident's findings and plans as documented in his progress note.  [name] was a bit brighter personality wise this morning when seen on rounds. We continue to wean his oxygen with the ultimate goal of weaning it off altogether. He continues to make slightly more urine, but still is requiring periodic hemodialysis. We are hopeful the ATN resolves and renal function returns so he can manage volume himself. This may ease the placement process.  At this point, we are working on physical therapy, but he will ultimately require more intensive rehabilitation before returning home. Hence why it's important his renal function returns so that he no longer requires hemodialysis and that we wean the oxygen as appropriate so he is ready for transfer to a skilled nursing facility when the time comes.

## 2017-10-04 NOTE — Procedures (Signed)
I was present at this session.  I have reviewed the session itself and made appropriate changes.  HD via PC. Has xs vol but keep bp up with AKI and ? Recovery. . Flows ok, tol well  Mauricia Area 2/27/20192:26 PM

## 2017-10-04 NOTE — Progress Notes (Signed)
OT Cancellation Note  Patient Details Name: [name] MRN: [id number] DOB: Jan 08, 1956   Cancelled Treatment:    Reason Eval/Treat Not Completed: Patient at procedure or test/ unavailable (HD); will follow up as schedule allows.  Lou Cal, OT Pager [phone] 10/04/2017   [name] 10/04/2017, 4:08 PM

## 2017-10-04 NOTE — Progress Notes (Addendum)
Subjective: Patient seen resting in his bed this morning. He did have a brief episode of A. Fib with RVR yesterday evening, which resolved spontaneously. He states he slept well last night and he seems more alert this morning; there were no reports of agitation overnight. He denies pain or shortness of breath this morning. He remains deconditioned but would like to get out of the bed and over to his chair today, which I agree will be good for him. He has no other questions or complaints this morning.  Objective:  Vital signs in last 24 hours: Vitals:   10/04/17 0610 10/04/17 0840 10/04/17 0958 10/04/17 1224  BP:   (!) 155/59 (!) 139/56  Pulse: 60  64 64  Resp: (!) 23     Temp:      TempSrc:      SpO2: 97% 100%    Weight:      Height:       Physical Exam  Constitutional: He appears well-developed.  Chronically ill-appearing male  HENT:  Head: Normocephalic and atraumatic.  Eyes: EOM are normal. Right eye exhibits no discharge. Left eye exhibits no discharge.  Cardiovascular: Normal rate, regular rhythm, normal heart sounds and intact distal pulses.  Pulmonary/Chest: Effort normal.  Moderate diffuse Rhonchi  Abdominal: Soft. Bowel sounds are normal. He exhibits no distension. There is no tenderness.  Musculoskeletal: He exhibits no edema or deformity.  Neurological: He is alert.  Skin: Skin is warm and dry.   Assessment/Plan:  PAF with RVR Tachy-Brady Syndrome.  Currently in sinus rhythm. Brief episode of RVR yesterday evening. EP not placing permanent pacemaker this admission, patient to follow up with them as an outpatient. 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 Bridging to warfarin given Acute on Chronic Renal Failure at this time. - EP/Cardiology following, appreciate any  recommendations - Avoid metoprolol to prevent any bradycardia. - Amiodarone 200mg  BID - Bridge from heparin to Warfarin for anticoagulation (started on 2/26)  Acute on Chronic Renal Failure. Patient is no longer oliguric. Renal function may be recovering. Remains volume overloaded. On intermittent HD. Tunneled dialysis cather placed 2/25. - Nephrology is following - Renal Function Panel in AM  Dysphagia.  Continue dysphagia 2 diet with honey thick liquid.  Agitation: Improving. Patient slept well through the night. - Will minimize nighttime disruptions and promote wakefulness in the daytime to get patient on a more normal day night schedule.  Anemia.  Stable. Hemoglobin stable at 8.6. S/p 1 Unit PRBC 2/25. Patient transitioning to warfarin for chronic anticoagulation in the setting of Paroxysmal A.fib. On 2/23: Fe 17, Ferritin 87 >> Received Feraheme 510mg  IV (has a history of serial Feraheme infusion, may need to resume this on discharge). - Getting Aranesp with nephrology - Ferrous Sulfate 325mg  Daily  Influenza Pneumonia Respiratory Failure: Continues to improve. Remains severely deconditioned after prolonged intubation.  Saturating well on 2L supplemental O2. He has Completed courses of antibitoics and Tamiflu. - PT/OT recommend SNF - Will continue monitoring respiratory status - Incentive spirometry and flutter valve - No O2 > 2L unless sats <90% - OOB to chair - Chest PT - Continue DuoNeb  DM. Stable. Current A1c on 09/28/17 was 7.9.  - Continue Lantus 15 units at bedtime with SSI.  Dispo: Anticipated discharge pending clinical  course and placement needs.  Neva Seat, MD 10/04/2017, 3:14 PM Pager: [phone]

## 2017-10-04 NOTE — Progress Notes (Signed)
ANTICOAGULATION CONSULT NOTE - Follow Up Consult  Pharmacy Consult for Heparin Indication: atrial fibrillation  Allergies  Allergen Reactions  . Penicillins Hives, Nausea And Vomiting, Swelling and Other (See Comments)    Tolerated Cefepime Has patient had a PCN reaction causing immediate rash, facial/tongue/throat swelling, SOB or lightheadedness with hypotension: YES Has patient had a PCN reaction causing severe rash involving mucus membranes or skin necrosis: No Has patient had a PCN reaction that required hospitalization No Has patient had a PCN reaction occurring within the last 10 years: No If all of the above answers are "NO", then may proceed with Cephalosporin use.     Patient Measurements: Height: 5' (152.4 cm) Weight: 186 lb 4.6 oz (84.5 kg) IBW/kg (Calculated) : 50 Heparin Dosing Weight: 68.7 kg   Vital Signs: Temp: 97.6 F (36.4 C) (02/27 2001) Temp Source: Oral (02/27 2001) BP: 113/47 (02/27 2001) Pulse Rate: 59 (02/27 2001)  Labs: Recent Labs    10/02/17 0624  10/02/17 1611 10/03/17 0204 10/03/17 1149 10/03/17 1545 10/04/17 1117 10/04/17 2137  HGB  --    < > 8.0* 8.3*  --   --  8.6*  --   HCT  --    < > 24.7* 26.3*  --   --  27.5*  --   PLT  --    < > 104* 115*  --   --  140*  --   LABPROT  --   --   --   --   --  14.4 13.7  --   INR  --   --   --   --   --  1.13 1.06  --   HEPARINUNFRC  --   --   --  0.39 0.47  --  0.22* 0.52  CREATININE 3.94*  --   --  2.54*  --   --  3.73*  --    < > = values in this interval not displayed.    Estimated Creatinine Clearance: 18.3 mL/min (A) (by C-G formula based on SCr of 3.73 mg/dL (H)).  Assessment: Pt is on heparin for afib. Heparin level (0.52) is therapeutic now after rate increase earlier this afternoon.  Goal of Therapy:  Heparin level 0.3-0.7 units/ml  INR 2-3 Monitor platelets by anticoagulation protocol: Yes   Plan:  - Continue IV heparin 1550 units/hr - Daily HL and CBC.  Maryanna Shape, PharmD,  BCPS  Clinical Pharmacist  Pager: [phone]    Manley Mason 10/04/2017,10:47 PM

## 2017-10-04 NOTE — NC FL2 (Signed)
[place] LEVEL OF CARE SCREENING TOOL     IDENTIFICATION  Patient Name: [name] Birthdate: March 19, 1956 Sex: male Admission Date (Current Location): 09/15/2017  [facility] and [place] Number:  Herbalist and Address:  [facility] [place], [place] [address], [place], [place] [address]      Provider Number: [id number]  Attending Physician Name and Address:  Oval Linsey, MD  Relative Name and Phone Number:  [name], [phone]    Current Level of Care: Hospital Recommended Level of Care: [place] Prior Approval Number:    Date Approved/Denied:   PASRR Number: [id number]  Discharge Plan: SNF    Current Diagnoses: Patient Active Problem List   Diagnosis Date Noted  . Anemia of chronic disease   . Pressure injury of skin 09/26/2017  . AKI (acute kidney injury) ([place])   . Aspiration pneumonia of both lower lobes ([place])   . Influenza with respiratory manifestation   . Tachy-brady syndrome ([place])   . Sinus pause   . Intertrigo   . Right bundle branch block (RBBB) on electrocardiography   . Dysphagia   . Sepsis ([place]) 02/13/2016  . Ischemic cardiomyopathy 09/17/2015  . CAD (coronary artery disease) 07/14/2015  . Chronic combined systolic and diastolic CHF (congestive heart failure) ([place]) 07/14/2015  . Abrasion of abdominal wall 10/26/2014  . Acute respiratory failure with hypoxia ([place]) 10/26/2014  . Gastritis and gastroduodenitis 06/10/2014  . Benign neoplasm of ascending colon 06/10/2014  . 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 Wound, open, foot 12/13/2012  . IDA (iron deficiency anemia) 12/13/2012  . Hyperkalemia 05/22/2012  . Diabetic leg ulcer ([place]) 05/15/2012  . Routine health maintenance 05/15/2012  . Leg weakness, bilateral 12/10/2011  . Carotid artery disease ([place]) 08/31/2011  . Sciatica 05/22/2011  . Ventral hernia without obstruction or gangrene  01/24/2011  . Incidental lung nodule, greater than or equal to 46mm 11/24/2010  . CVA (cerebral vascular accident) ([place]) 10/24/2010  . Paroxysmal atrial fibrillation with rapid ventricular response ([place]) 07/24/2009  . Anxiety state 06/22/2007  . DM (diabetes mellitus), type 2, uncontrolled ([place]) 12/22/2006  . Essential hypertension 12/22/2006  . HYPERCHOLESTEROLEMIA 10/05/2006  . TOBACCO DEPENDENCE 10/05/2006  . Unspecified intellectual disabilities 10/05/2006  . COPD (chronic obstructive pulmonary disease) ([place]) 10/05/2006    Orientation RESPIRATION BLADDER Height & Weight     Self, Time, Situation, Place  O2(2L) External catheter, Continent Weight: 187 lb 13.3 oz (85.2 kg) Height:  5' (152.4 cm)  BEHAVIORAL SYMPTOMS/MOOD NEUROLOGICAL BOWEL NUTRITION STATUS      Incontinent Diet(DSY2)  AMBULATORY STATUS COMMUNICATION OF NEEDS Skin   Extensive Assist Verbally Other (Comment)(R IJ Tunneled cath)                       Personal Care Assistance Level of Assistance  Bathing, Feeding, Dressing Bathing Assistance: Limited assistance Feeding assistance: Limited assistance Dressing Assistance: Limited assistance     Functional Limitations Info  Sight, Hearing, Speech Sight Info: Adequate Hearing Info: Adequate Speech Info: Adequate    SPECIAL CARE FACTORS FREQUENCY  PT (By licensed PT), OT (By licensed OT)     PT Frequency: 5x wk OT Frequency: 5x wk            Contractures Contractures Info: Not present    Additional Factors Info  Code Status, Allergies Code Status Info: Full Code Allergies Info: PENICILLINS  Current Medications (10/04/2017):  This is the current hospital active medication list Current Facility-Administered Medications  Medication Dose Route Frequency Provider Last Rate Last Dose  . 0.9 %  sodium chloride infusion   Intravenous Continuous Marshell Garfinkel, MD   Stopped at 10/03/17 1900  . acetaminophen (TYLENOL) tablet 650 mg  650 mg  Oral Q4H PRN Jettie Booze, MD   650 mg at 10/01/17 2323  . amiodarone (PACERONE) tablet 200 mg  200 mg Oral BID Neva Seat, MD   200 mg at 10/04/17 0943  . atorvastatin (LIPITOR) tablet 80 mg  80 mg Oral Daily Mannam, Praveen, MD   80 mg at 10/04/17 0943  . bisacodyl (DULCOLAX) suppository 10 mg  10 mg Rectal Daily PRN Darlina Sicilian A, NP   10 mg at 09/27/17 1517  . camphor-menthol (SARNA) lotion   Topical PRN Neva Seat, MD      . chlorhexidine (PERIDEX) 0.12 % solution 15 mL  15 mL Mouth Rinse BID Rigoberto Noel, MD   15 mL at 10/04/17 0943  . Chlorhexidine Gluconate Cloth 2 % PADS 6 each  6 each Topical Daily Marshell Garfinkel, MD   6 each at 10/04/17 0944  . coumadin book   Does not apply Once Karren Cobble, [place]      . Darbepoetin Alfa (ARANESP) 150 MCG/0.3ML injection           . Darbepoetin Alfa (ARANESP) injection 150 mcg  150 mcg Intravenous Q Wed-HD Oval Linsey, MD      . docusate sodium (COLACE) capsule 100 mg  100 mg Oral BID Mannam, Praveen, MD   100 mg at 10/01/17 2324  . [START ON 10/05/2017] ferrous sulfate tablet 325 mg  325 mg Oral Q breakfast Neva Seat, MD      . heparin ADULT infusion 100 units/mL (25000 units/235mL sodium chloride 0.45%)  1,550 Units/hr Intravenous Continuous Karren Cobble, RPH 15.5 mL/hr at 10/04/17 1527 1,550 Units/hr at 10/04/17 1527  . hydrocerin (EUCERIN) cream   Topical BID Rush Farmer, MD      . insulin aspart (novoLOG) injection 0-20 Units  0-20 Units Subcutaneous TID WC Neva Seat, MD      . insulin glargine (LANTUS) injection 15 Units  15 Units Subcutaneous QHS Marijean Heath, NP   15 Units at 10/02/17 2143  . ipratropium-albuterol (DUONEB) 0.5-2.5 (3) MG/3ML nebulizer solution 3 mL  3 mL Nebulization BID Oval Linsey, MD   3 mL at 10/04/17 0839  . MEDLINE mouth rinse  15 mL Mouth Rinse BID Rigoberto Noel, MD   15 mL at 10/04/17 0945  . MEDLINE mouth rinse  15 mL Mouth Rinse q12n4p  Rigoberto Noel, MD   15 mL at 10/03/17 1200  . multivitamin (RENA-VIT) tablet 1 tablet  1 tablet Oral QHS Mauricia Area, MD   1 tablet at 10/03/17 2219  . nitroGLYCERIN (NITROSTAT) SL tablet 0.4 mg  0.4 mg Sublingual Q5 min PRN Collier Salina, MD      . ondansetron [facility]) injection 4 mg  4 mg Intravenous Q6H PRN Jettie Booze, MD   4 mg at 09/25/17 0904  . pantoprazole (PROTONIX) EC tablet 40 mg  40 mg Oral Daily Mannam, Praveen, MD   40 mg at 10/04/17 0943  . PARoxetine (PAXIL) tablet 40 mg  40 mg Oral Daily Neva Seat, MD   40 mg at 10/04/17 0943  . polyvinyl alcohol (LIQUIFILM TEARS) 1.4 % ophthalmic solution 1 drop  1  drop Both Eyes BID PRN Rice, Resa Miner, MD      . RESOURCE THICKENUP CLEAR   Oral PRN Lind Covert, MD      . senna (SENOKOT) tablet 8.6 mg  1 tablet Oral BID Darlina Sicilian A, NP   8.6 mg at 10/03/17 1055  . senna-docusate (Senokot-S) tablet 1 tablet  1 tablet Oral QHS Marijean Heath, NP   1 tablet at 10/03/17 2219  . sodium chloride flush (NS) 0.9 % injection 3 mL  3 mL Intravenous Q12H Jettie Booze, MD   3 mL at 10/04/17 0946  . sodium chloride flush (NS) 0.9 % injection 3 mL  3 mL Intravenous PRN Jettie Booze, MD   10 mL at 09/28/17 2057  . warfarin (COUMADIN) tablet 6 mg  6 mg Oral ONCE-1800 Robertson, Crystal S, [place]      . warfarin (COUMADIN) video   Does not apply Once Karren Cobble, RPH      . Warfarin - Pharmacist Dosing Inpatient   Does not apply q1800 Karren Cobble [facility]         Discharge Medications: Please see discharge summary for a list of discharge medications.  Relevant Imaging Results:  Relevant Lab Results:   Additional Information SS# [id number]  Wende Neighbors, LCSW

## 2017-10-05 DIAGNOSIS — Z960 Presence of urogenital implants: Secondary | ICD-10-CM

## 2017-10-05 DIAGNOSIS — R6520 Severe sepsis without septic shock: Secondary | ICD-10-CM

## 2017-10-05 LAB — GLUCOSE, CAPILLARY
GLUCOSE-CAPILLARY: 84 mg/dL (ref 65–99)
GLUCOSE-CAPILLARY: 74 mg/dL (ref 65–99)
GLUCOSE-CAPILLARY: 87 mg/dL (ref 65–99)
Glucose-Capillary: 84 mg/dL (ref 65–99)
Glucose-Capillary: 102 mg/dL — ABNORMAL HIGH (ref 65–99)
Glucose-Capillary: 83 mg/dL (ref 65–99)

## 2017-10-05 LAB — CBC
HCT: 26.7 % — ABNORMAL LOW (ref 39.0–52.0)
HEMOGLOBIN: 8.3 g/dL — AB (ref 13.0–17.0)
MCH: 27.4 pg (ref 26.0–34.0)
MCHC: 31.1 g/dL (ref 30.0–36.0)
MCV: 88.1 fL (ref 78.0–100.0)
Platelets: 137 10*3/uL — ABNORMAL LOW (ref 150–400)
RBC: 3.03 MIL/uL — AB (ref 4.22–5.81)
RDW: 17.8 % — ABNORMAL HIGH (ref 11.5–15.5)
WBC: 5.2 10*3/uL (ref 4.0–10.5)

## 2017-10-05 LAB — RENAL FUNCTION PANEL
Albumin: 2.5 g/dL — ABNORMAL LOW (ref 3.5–5.0)
Anion gap: 10 (ref 5–15)
BUN: 11 mg/dL (ref 6–20)
CHLORIDE: 99 mmol/L — AB (ref 101–111)
CO2: 27 mmol/L (ref 22–32)
CREATININE: 2.17 mg/dL — AB (ref 0.61–1.24)
Calcium: 8.4 mg/dL — ABNORMAL LOW (ref 8.9–10.3)
GFR calc Af Amer: 35 mL/min — ABNORMAL LOW (ref >60)
GFR, EST NON AFRICAN AMERICAN: 31 mL/min — AB (ref >60)
Glucose, Bld: 90 mg/dL (ref 65–99)
Phosphorus: 2.5 mg/dL (ref 2.5–4.6)
Potassium: 3.9 mmol/L (ref 3.5–5.1)
Sodium: 136 mmol/L (ref 135–145)

## 2017-10-05 LAB — PROTIME-INR
INR: 1.13
PROTHROMBIN TIME: 14.4 s (ref 11.4–15.2)

## 2017-10-05 LAB — HEPARIN LEVEL (UNFRACTIONATED): Heparin Unfractionated: 0.45 IU/mL (ref 0.30–0.70)

## 2017-10-05 MED ORDER — WARFARIN SODIUM 7.5 MG PO TABS
7.5000 mg | ORAL_TABLET | Freq: Once | ORAL | Status: AC
  Administered 2017-10-05: 7.5 mg via ORAL
  Filled 2017-10-05: qty 1

## 2017-10-05 MED ORDER — METOPROLOL TARTRATE 5 MG/5ML IV SOLN: 5.0000 mg | INTRAVENOUS | Status: DC | PRN

## 2017-10-05 NOTE — Progress Notes (Signed)
Physical Therapy Treatment Patient Details Name: [name] MRN: [id number] DOB: 03-24-1956 Today's Date: 10/05/2017    History of Present Illness Pt is a 62 y.o. male admitted 09/15/17 with generalized weakness, cough, and dizziness; found to be back in a-fib with rapid rate. S/p temporary pacemaker placement 2/8; electrophysiology avoiding PPM implant due to pt's co-morbidities. Intubated 2/12-2/21. Also with acute on chronic renal failure; s/p R IJ tunnelled dialysis cath placement 10/02/17. PMH includes CHF, CAD (s/p CABG 2017), carotid stenosis (s/p L carotid endarterectomy), CVA, COPD, HTN, DM, intellectual disability.     PT Comments    Patient received in bed, pleasant and willing to participate in PT/OT treatment session; he is very bright and very pleasant to work with this morning. He does require MaxAx1 and ModAx1 (assist of +2 total) to come to edge of bed, displays poor sitting balance with intermittent loss of balance in posterior/right direction and requiring ModA to return to upright sitting this morning. Performed lateral/scoot transfer to drop arm recliner with TotalAx2 today, also repositioned in chair with TotalAx2. He was left up in the chair with MD present and attending, all other needs met this morning; CNA educated on mobility status and therapy recommendation for transfer technique with return to bed later in day.     Follow Up Recommendations  SNF;Supervision/Assistance - 24 hour     Equipment Recommendations  None recommended by PT(defer to next venue )    Recommendations for Other Services       Precautions / Restrictions Precautions Precautions: Fall Precaution Comments: s/p RIJ tunnelled cath 2/25 Restrictions Weight Bearing Restrictions: No    Mobility  Bed Mobility Overal bed mobility: Needs Assistance Bed Mobility: Supine to Sit     Supine to sit: +2 for physical assistance;Mod assist;Max assist     General bed mobility comments: requires  ModAx1 and MaxAx1 for safety bed mobility to move B LEs to EOB and bring trunk up; note poor sitting balance with patient intermittently falling to R/posterior direction and requring ModA to return to upright   Transfers Overall transfer level: Needs assistance   Transfers: Lateral/Scoot Transfers          Lateral/Scoot Transfers: Total assist;+2 physical assistance;+2 safety/equipment General transfer comment: required TotalAx2 for safe lateral scoot transfer into drop arm recliner, patient weak with very little initiation and effort noted from him to assist therapists during transfer; TotaAx2 to reposition in chair   Ambulation/Gait             General Gait Details: unable    Stairs            Wheelchair Mobility    Modified Rankin (Stroke Patients Only)       Balance Overall balance assessment: Needs assistance Sitting-balance support: Bilateral upper extremity supported;Feet unsupported Sitting balance-Leahy Scale: Poor Sitting balance - Comments: tends to intermittently fall to R/posterior direction  Postural control: Posterior lean;Right lateral lean   Standing balance-Leahy Scale: Zero Standing balance comment: reliant on therapist support                             Cognition Arousal/Alertness: Awake/alert   Overall Cognitive Status: History of cognitive impairments - at baseline Area of Impairment: Attention;Memory;Following commands;Safety/judgement;Awareness;Problem solving                   Current Attention Level: Sustained Memory: Decreased short-term memory Following Commands: Follows one step commands consistently Safety/Judgement: Decreased awareness of  deficits;Decreased awareness of safety Awareness: Emergent Problem Solving: Slow processing;Decreased initiation;Requires verbal cues;Requires tactile cues General Comments: command following is improving, patient does slowly initiate to assist with bed level transfer with  encouragement and increased time; poor safety awareness noted. Very pleasant and interactive with therapists today       Exercises      General Comments        Pertinent Vitals/Pain Pain Assessment: Faces Faces Pain Scale: Hurts little more Pain Location: rear/sacral area  Pain Descriptors / Indicators: Sore Pain Intervention(s): Limited activity within patient's tolerance;Monitored during session;Repositioned    Home Living                      Prior Function            PT Goals (current goals can now be found in the care plan section) Acute Rehab PT Goals Patient Stated Goal: Get stronger PT Goal Formulation: With patient Time For Goal Achievement: 10/17/17 Potential to Achieve Goals: Good Progress towards PT goals: Progressing toward goals    Frequency    Min 2X/week      PT Plan Current plan remains appropriate    Co-evaluation PT/OT/SLP Co-Evaluation/Treatment: Yes Reason for Co-Treatment: For patient/therapist safety;To address functional/ADL transfers PT goals addressed during session: Mobility/safety with mobility;Balance        AM-PAC PT "6 Clicks" Daily Activity  Outcome Measure  Difficulty turning over in bed (including adjusting bedclothes, sheets and blankets)?: Unable Difficulty moving from lying on back to sitting on the side of the bed? : Unable Difficulty sitting down on and standing up from a chair with arms (e.g., wheelchair, bedside commode, etc,.)?: Unable Help needed moving to and from a bed to chair (including a wheelchair)?: Total Help needed walking in hospital room?: Total Help needed climbing 3-5 steps with a railing? : Total 6 Click Score: 6    End of Session Equipment Utilized During Treatment: Gait belt;Oxygen Activity Tolerance: Patient tolerated treatment well Patient left: in chair;with call bell/phone within reach;Other (comment)(MD present and attending ) Nurse Communication: Mobility status PT Visit  Diagnosis: Other abnormalities of gait and mobility (R26.89);Muscle weakness (generalized) (M62.81);Difficulty in walking, not elsewhere classified (R26.2)     Time: 0100-7121 PT Time Calculation (min) (ACUTE ONLY): 27 min  Charges:  $Therapeutic Activity: 8-22 mins                    G Codes:       Deniece Ree PT, DPT, CBIS  Supplemental Physical Therapist [place]   Pager [phone]

## 2017-10-05 NOTE — Discharge Instructions (Signed)
Information on my medicine - Coumadin®   (Warfarin) ° °This medication education was reviewed with me or my healthcare representative as part of my discharge preparation.  The pharmacist that spoke with me during my hospital stay was:  Betzabe Bevans Stillinger, RPH ° °Why was Coumadin prescribed for you? °Coumadin was prescribed for you because you have a blood clot or a medical condition that can cause an increased risk of forming blood clots. Blood clots can cause serious health problems by blocking the flow of blood to the heart, lung, or brain. Coumadin can prevent harmful blood clots from forming. °As a reminder your indication for Coumadin is:   Stroke Prevention Because Of Atrial Fibrillation ° °What test will check on my response to Coumadin? °While on Coumadin (warfarin) you will need to have an INR test regularly to ensure that your dose is keeping you in the desired range. The INR (international normalized ratio) number is calculated from the result of the laboratory test called prothrombin time (PT). ° °If an INR APPOINTMENT HAS NOT ALREADY BEEN MADE FOR YOU please schedule an appointment to have this lab work done by your health care provider within 7 days. °Your INR goal is usually a number between:  2 to 3 or your provider may give you a more narrow range like 2-2.5.  Ask your health care provider during an office visit what your goal INR is. ° °What  do you need to  know  About  COUMADIN? °Take Coumadin (warfarin) exactly as prescribed by your healthcare provider about the same time each day.  DO NOT stop taking without talking to the doctor who prescribed the medication.  Stopping without other blood clot prevention medication to take the place of Coumadin may increase your risk of developing a new clot or stroke.  Get refills before you run out. ° °What do you do if you miss a dose? °If you miss a dose, take it as soon as you remember on the same day then continue your regularly scheduled  regimen the next day.  Do not take two doses of Coumadin at the same time. ° °Important Safety Information °A possible side effect of Coumadin (Warfarin) is an increased risk of bleeding. You should call your healthcare provider right away if you experience any of the following: °  Bleeding from an injury or your nose that does not stop. °  Unusual colored urine (red or dark brown) or unusual colored stools (red or black). °  Unusual bruising for unknown reasons. °  A serious fall or if you hit your head (even if there is no bleeding). ° °Some foods or medicines interact with Coumadin® (warfarin) and might alter your response to warfarin. To help avoid this: °  Eat a balanced diet, maintaining a consistent amount of Vitamin K. °  Notify your provider about major diet changes you plan to make. °  Avoid alcohol or limit your intake to 1 drink for women and 2 drinks for men per day. °(1 drink is 5 oz. wine, 12 oz. beer, or 1.5 oz. liquor.) ° °Make sure that ANY health care provider who prescribes medication for you knows that you are taking Coumadin (warfarin).  Also make sure the healthcare provider who is monitoring your Coumadin knows when you have started a new medication including herbals and non-prescription products. ° °Coumadin® (Warfarin)  Major Drug Interactions  °Increased Warfarin Effect Decreased Warfarin Effect  °Alcohol (large quantities) °Antibiotics (esp. Septra/Bactrim, Flagyl, Cipro) °Amiodarone (Cordarone) °Aspirin (  ASA) °Cimetidine (Tagamet) °Megestrol (Megace) °NSAIDs (ibuprofen, naproxen, etc.) °Piroxicam (Feldene) °Propafenone (Rythmol SR) °Propranolol (Inderal) °Isoniazid (INH) °Posaconazole (Noxafil) Barbiturates (Phenobarbital) °Carbamazepine (Tegretol) °Chlordiazepoxide (Librium) °Cholestyramine (Questran) °Griseofulvin °Oral Contraceptives °Rifampin °Sucralfate (Carafate) °Vitamin K  ° °Coumadin® (Warfarin) Major Herbal Interactions  °Increased Warfarin Effect Decreased Warfarin Effect    °Garlic °Ginseng °Ginkgo biloba Coenzyme Q10 °Green tea °St. John’s wort   ° °Coumadin® (Warfarin) FOOD Interactions  °Eat a consistent number of servings per week of foods HIGH in Vitamin K °(1 serving = ½ cup)  °Collards (cooked, or boiled & drained) °Kale (cooked, or boiled & drained) °Mustard greens (cooked, or boiled & drained) °Parsley *serving size only = ¼ cup °Spinach (cooked, or boiled & drained) °Swiss chard (cooked, or boiled & drained) °Turnip greens (cooked, or boiled & drained)  °Eat a consistent number of servings per week of foods MEDIUM-HIGH in Vitamin K °(1 serving = 1 cup)  °Asparagus (cooked, or boiled & drained) °Broccoli (cooked, boiled & drained, or raw & chopped) °Brussel sprouts (cooked, or boiled & drained) *serving size only = ½ cup °Lettuce, raw (green leaf, endive, romaine) °Spinach, raw °Turnip greens, raw & chopped  ° °These websites have more information on Coumadin (warfarin):  www.coumadin.com; °www.ahrq.gov/consumer/coumadin.htm; ° ° ° °

## 2017-10-05 NOTE — Progress Notes (Addendum)
Subjective: Patient was seen in his bedside chair this morning. He had been napping in the chair. He states that he feels tired and that he was up about half of the night. He was encouraged to try to stay awake today to make it easier for him to sleep tonight. No episodes of RVR overnight. He denies any pain or dyspnea today. He has been saturating well on 2L by nasal cannula. Patient had indwelling catheter placed by urology on 2/12; will ensure he is able to hold bedside urinal and void on his own and remove if it is. He has no other questions or complaints this morning.  Objective:  Vital signs in last 24 hours: Vitals:   10/04/17 2033 10/04/17 2344 10/05/17 0413 10/05/17 0920  BP:  (!) 147/54 (!) 157/68   Pulse:  63 (!) 59 63  Resp:  19 19 20   Temp:  97.9 F (36.6 C)    TempSrc:  Oral Oral   SpO2: 98% 95% 96% 95%  Weight:   179 lb (81.2 kg)   Height:       Physical Exam  Constitutional: He appears well-developed.  Chronically ill-appearing male  HENT:  Head: Normocephalic and atraumatic.  Eyes: EOM are normal. Right eye exhibits no discharge. Left eye exhibits no discharge.  Cardiovascular: Normal rate, regular rhythm, normal heart sounds and intact distal pulses.  Pulmonary/Chest: Effort normal.  Mild diffuse Rhonchi R>L Mild R sided Wheezing  Abdominal: Soft. Bowel sounds are normal. He exhibits no distension. There is no tenderness.  Large chronic Hernia  Musculoskeletal: He exhibits no edema or tenderness.  R Midfoot amputation  Neurological: He is alert.  Skin: Skin is warm and dry.   Assessment/Plan:  PAF with RVR Tachy-Brady Syndrome.  Currently in Sinus Rhythm. No RVR in the last 24 hours. EP not placing permanent pacemaker this admission, patient to follow up with them as an outpatient. Patient bridging to warfarin; If renal function improves, consider alternate anticoagulant. - Bridge from heparin to Warfarin for anticoagulation (started on 2/26) - EP has been  following and remain available as needed - Avoid metoprolol to prevent any bradycardia. - Amiodarone 200mg  BID  Acute on Chronic Renal Failure. Patient is no longer oliguric. Renal function may be recovering. On intermittent HD. Tunneled dialysis cather placed 2/25. - Nephrology is following - Renal Function Panel in AM  Agitation: Improving. He had some trouble sleeping over night, but slept half the night. Drowsy this morning.  - Will continue to minimize nighttime disruptions and promote wakefulness in the daytime to promote good day-night schedule  Dysphagia. 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 - Continue dysphagia 2 diet with honey thick liquid.  Anemia.  Stable. Hemoglobin stable at 8.3. S/p 1 Unit PRBC 2/25. Patient has history of chronic anemia with serial Feraheme infusions. On 2/23: Fe 17, Ferritin 87 >> Received Feraheme 510mg  IV. - Bridging to warfarin anticoagulation as above - Getting Aranesp with nephrology - Ferrous Sulfate 325mg  Daily  Influenza Pneumonia Respiratory Failure: Respiratory status continues to improve. Saturating well on 2L [place]. Pneumonia and Flu resolved. Completed courses of antibitoics and Tamiflu. Remains severely deconditioned after prolonged intubation, will be pursuing SNF placement at discharge. - Continue monitoring respiratory status - Incentive spirometry and flutter valve - No O2 > 2L unless sats <90% - OOB to chair - Chest PT - Continue DuoNeb  DM. Stable. Current A1c on 09/28/17  was 7.9.  - Continue Lantus 15 units at bedtime with SSI.  Dispo: Anticipated discharge pending clinical course and placement needs.  Neva Seat, MD 10/05/2017, 11:11 AM Pager: [phone]

## 2017-10-05 NOTE — Progress Notes (Signed)
Notified by CCMD of patient HR change. EKG confirmed Afib RVR 118. Pt resting in bed. No apparent distress. Internal Medicine called. Advised to monitor patient at this time because pt afib resolved on its own previosly. If patient HR greater than 120 for 30 minutes or more and patients vital signs suffer then call IM doctors back. Will continue to monitor.

## 2017-10-05 NOTE — Progress Notes (Signed)
Clinical Social Worker following patient for disposition needs. Patient at this time does have a bed at [facility] once ready for discharge.  Rhea Pink, MSW,  [place]

## 2017-10-05 NOTE — Evaluation (Signed)
Occupational Therapy Evaluation Patient Details Name: [name] MRN: [id number] DOB: 04/19/1956 Today's Date: 10/05/2017    History of Present Illness Pt is a 62 y.o. male admitted 09/15/17 with generalized weakness, cough, and dizziness; found to be back in a-fib with rapid rate. S/p temporary pacemaker placement 2/8; electrophysiology avoiding PPM implant due to pt's co-morbidities. Intubated 2/12-2/21. Also with acute on chronic renal failure; s/p R IJ tunnelled dialysis cath placement 10/02/17. PMH includes CHF, CAD (s/p CABG 2017), carotid stenosis (s/p L carotid endarterectomy), CVA, COPD, HTN, DM, intellectual disability.    Clinical Impression   PTA per chart, Pt was Mod I for ADL/IADL and mobility with RW. He was cooking and active participant in [place]. Currently he presents with min guard set up for grooming tasks, total A for LB ADL at this time. He does require MaxAx1 and ModAx1 (assist of +2 total) to come to edge of bed, displays poor sitting balance with intermittent loss of balance in posterior/right direction and requiring ModA to return to upright sitting this morning impacting ADL performance. Lateral/scoot transfer to drop arm recliner with TotalAx2 today, also repositioned in chair with TotalAx2. He was left up in the chair with MD present and attending, all other needs met this morning; CNA educated on mobility status and therapy recommendation for transfer technique with return to bed later in day. OT will continue to follow in the acute setting and Pt will require SNF level therapy at dc for safety and to maximize independence in functional transfers and ADL.    Follow Up Recommendations  SNF;Supervision/Assistance - 24 hour    Equipment Recommendations  Wheelchair (measurements OT);Wheelchair cushion (measurements OT);3 in 1 bedside commode;Other (comment)(bariatric 3 in 1 - also defer to SNF/PACE)    Recommendations for Other Services       Precautions /  Restrictions Precautions Precautions: Fall Precaution Comments: s/p RIJ tunnelled cath 2/25 Restrictions Weight Bearing Restrictions: No      Mobility Bed Mobility Overal bed mobility: Needs Assistance Bed Mobility: Supine to Sit     Supine to sit: +2 for physical assistance;Mod assist;Max assist     General bed mobility comments: requires ModAx1 and MaxAx1 for safety bed mobility to move B LEs to EOB and bring trunk up; note poor sitting balance with patient intermittently falling to R/posterior direction and requring ModA to return to upright   Transfers Overall transfer level: Needs assistance   Transfers: Lateral/Scoot Transfers          Lateral/Scoot Transfers: Total assist;+2 physical assistance;+2 safety/equipment(reliant on bed pad to assist with transfer) General transfer comment: required TotalAx2 for safe lateral scoot transfer into drop arm recliner, patient weak with very little initiation and effort noted from him to assist therapists during transfer; TotaAx2 to reposition in chair     Balance Overall balance assessment: Needs assistance Sitting-balance support: Bilateral upper extremity supported;Feet unsupported Sitting balance-Leahy Scale: Poor Sitting balance - Comments: tends to intermittently fall to R/posterior direction  Postural control: Posterior lean;Right lateral lean   Standing balance-Leahy Scale: Zero Standing balance comment: reliant on therapist support                            ADL either performed or assessed with clinical judgement   ADL Overall ADL's : Needs assistance/impaired Eating/Feeding: Supervision/ safety;Sitting Eating/Feeding Details (indicate cue type and reason): must be supervised by staff currently Grooming: Min guard;Sitting Grooming Details (indicate cue type and reason): Pt will  ROM that will allow him to perform grooming tasks Upper Body Bathing: Moderate assistance   Lower Body Bathing: Maximal  assistance   Upper Body Dressing : Maximal assistance Upper Body Dressing Details (indicate cue type and reason): limited seated balance/activity tolerance Lower Body Dressing: Total assistance   Toilet Transfer: Maximal assistance;+2 for safety/equipment(rolling) Toilet Transfer Details (indicate cue type and reason): bed pan is safest right now for Pt Toileting- Clothing Manipulation and Hygiene: Total assistance Toileting - Clothing Manipulation Details (indicate cue type and reason): foley cath currently, unable to reach bottom in current condition for peri care     Functional mobility during ADLs: (not able currently)       Vision Baseline Vision/History: Wears glasses Patient Visual Report: No change from baseline       Perception     Praxis      Pertinent Vitals/Pain Pain Assessment: Faces Faces Pain Scale: Hurts little more Pain Location: rear/sacral area  Pain Descriptors / Indicators: Sore Pain Intervention(s): Limited activity within patient's tolerance;Monitored during session;Repositioned;Other (comment)(extra pillows under bottom in recliner)     Hand Dominance Right   Extremity/Trunk Assessment Upper Extremity Assessment Upper Extremity Assessment: Generalized weakness   Lower Extremity Assessment Lower Extremity Assessment: Generalized weakness   Cervical / Trunk Assessment Cervical / Trunk Assessment: Other exceptions Cervical / Trunk Exceptions: excess body habitus   Communication     Cognition Arousal/Alertness: Awake/alert Behavior During Therapy: Flat affect Overall Cognitive Status: History of cognitive impairments - at baseline Area of Impairment: Attention;Memory;Following commands;Safety/judgement;Awareness;Problem solving                   Current Attention Level: Sustained Memory: Decreased short-term memory Following Commands: Follows one step commands consistently Safety/Judgement: Decreased awareness of deficits;Decreased  awareness of safety Awareness: Emergent Problem Solving: Slow processing;Decreased initiation;Requires verbal cues;Requires tactile cues General Comments: command following is improving, patient does slowly initiate to assist with bed level transfer with encouragement and increased time; poor safety awareness noted. Very pleasant and interactive with therapists today    General Comments       Exercises     Shoulder Instructions      Home Living Family/patient expects to be discharged to:: Private residence Living Arrangements: Other relatives Available Help at Discharge: Family;Available PRN/intermittently Type of Home: House Home Access: Stairs to enter [employer] of Steps: 3 Entrance Stairs-Rails: Right;Left Home Layout: Able to live on main level with bedroom/bathroom     Bathroom Shower/Tub: Teacher, early years/pre: Standard     Home Equipment: Environmental consultant - 2 wheels;Cane - single point   Additional Comments: Pt poor historian, unclear if he lives with sister or sisters live nearby. Per RN, pt lives with sister      Prior Functioning/Environment Level of Independence: Independent with assistive device(s)        Comments: PACE participantPt poor historian, but reports mod indep with RW or SPC at home and in community. Per RN, sister states he was able to cook for himself and perform ADLs        OT Problem List: Decreased strength;Decreased range of motion;Decreased activity tolerance;Impaired balance (sitting and/or standing);Decreased cognition;Decreased safety awareness;Decreased knowledge of use of DME or AE;Decreased knowledge of precautions;Cardiopulmonary status limiting activity;Obesity      OT Treatment/Interventions: Self-care/ADL training;Therapeutic exercise;DME and/or AE instruction;Manual therapy;Therapeutic activities;Patient/family education;Balance training    OT Goals(Current goals can be found in the care plan section) Acute Rehab  OT Goals Patient Stated Goal: Get stronger OT Goal  Formulation: With patient Time For Goal Achievement: 10/19/17 Potential to Achieve Goals: Fair ADL Goals Pt Will Perform Grooming: with set-up;sitting Pt Will Perform Upper Body Bathing: with set-up;with adaptive equipment;sitting Pt Will Perform Lower Body Bathing: with supervision;with adaptive equipment;sitting/lateral leans Pt Will Transfer to Toilet: squat pivot transfer;with max assist;bedside commode Pt Will Perform Toileting - Clothing Manipulation and hygiene: with min guard assist;with adaptive equipment;sitting/lateral leans Pt/caregiver will Perform Home Exercise Program: Increased strength;Both right and left upper extremity;With theraband;With written HEP provided;Independently Additional ADL Goal #1: Pt will perform bed mobility at min A level prior to engaging in ADL activity  OT Frequency: Min 2X/week   Barriers to D/C:            Co-evaluation PT/OT/SLP Co-Evaluation/Treatment: Yes Reason for Co-Treatment: For patient/therapist safety;To address functional/ADL transfers PT goals addressed during session: Mobility/safety with mobility;Balance OT goals addressed during session: ADL's and self-care;Strengthening/ROM      AM-PAC PT "6 Clicks" Daily Activity     Outcome Measure Help from another person eating meals?: A Little Help from another person taking care of personal grooming?: A Little Help from another person toileting, which includes using toliet, bedpan, or urinal?: Total Help from another person bathing (including washing, rinsing, drying)?: A Lot Help from another person to put on and taking off regular upper body clothing?: A Lot Help from another person to put on and taking off regular lower body clothing?: Total 6 Click Score: 12   End of Session Equipment Utilized During Treatment: Gait belt;Oxygen Nurse Communication: Mobility status;Other (comment)(NT should go in an re-apply O2  monitor)  Activity Tolerance: Patient limited by fatigue Patient left: in chair;with call bell/phone within reach;with chair alarm set;Other (comment)(MDs in room performing morning exam)  OT Visit Diagnosis: Unsteadiness on feet (R26.81);Other abnormalities of gait and mobility (R26.89);Muscle weakness (generalized) (M62.81);Other symptoms and signs involving cognitive function;Adult, failure to thrive (R62.7)                Time: 4591-3685 OT Time Calculation (min): 27 min Charges:  OT General Charges $OT Visit: 1 Visit OT Evaluation $OT Eval Moderate Complexity: 1 Mod G-Codes:     Hulda Humphrey OTR/L Ronald 10/05/2017, 11:56 AM

## 2017-10-05 NOTE — Progress Notes (Signed)
ANTICOAGULATION CONSULT NOTE - Follow Up Consult  Pharmacy Consult for Heparin/Coumadin Indication: atrial fibrillation  Allergies  Allergen Reactions  . Penicillins Hives, Nausea And Vomiting, Swelling and Other (See Comments)    Tolerated Cefepime Has patient had a PCN reaction causing immediate rash, facial/tongue/throat swelling, SOB or lightheadedness with hypotension: YES Has patient had a PCN reaction causing severe rash involving mucus membranes or skin necrosis: No Has patient had a PCN reaction that required hospitalization No Has patient had a PCN reaction occurring within the last 10 years: No If all of the above answers are "NO", then may proceed with Cephalosporin use.     Patient Measurements: Height: 5' (152.4 cm) Weight: 179 lb (81.2 kg) IBW/kg (Calculated) : 50 Heparin Dosing Weight: 68.7 kg   Vital Signs: Temp: 97.9 F (36.6 C) (02/27 2344) Temp Source: Oral (02/28 0413) BP: 157/68 (02/28 0413) Pulse Rate: 63 (02/28 0920)  Labs: Recent Labs    10/03/17 0204  10/03/17 1545 10/04/17 1117 10/04/17 2137 10/05/17 0323  HGB 8.3*  --   --  8.6*  --  8.3*  HCT 26.3*  --   --  27.5*  --  26.7*  PLT 115*  --   --  140*  --  137*  LABPROT  --   --  14.4 13.7  --  14.4  INR  --   --  1.13 1.06  --  1.13  HEPARINUNFRC 0.39   < >  --  0.22* 0.52 0.45  CREATININE 2.54*  --   --  3.73*  --  2.17*   < > = values in this interval not displayed.    Estimated Creatinine Clearance: 30.8 mL/min (A) (by C-G formula based on SCr of 2.17 mg/dL (H)).  Assessment:  Anticoag: Xarelto 15mg  PTA for Afib (on med rec), not addressed in H&P by teaching service, oddly cards note says pt is NOT on anticoag 2/2 GIB in past. -HL= 0.45 in goal. Hgb 8.3 stable. Plts 137. INR 1.13. Started Coumadin. Watch for Coumadin and INR interaction with Amiodarone - 2/27: Went to HD to teach about Coumadin. Explained things repetitively, very basically, kept repeating myself, and patient was  distracted, kept yawning, wanting to go back to his room, and seemed confused. I do feel like he comprehended anything I said. Try again tomorrow.  Goal of Therapy:  Heparin level 0.3-0.7 units/ml  INR 2-3 Monitor platelets by anticoagulation protocol: Yes   Plan:  - Continue IV heparin to 1550 units/hr. - Daily HL and CBC. - Coumadin 7.5mg  po x 1 tonight. Daily INR  [name], PharmD, BCPS Clinical Staff Pharmacist Pager [phone]  [name] 10/05/2017,9:51 AM

## 2017-10-05 NOTE — Progress Notes (Signed)
  Speech Language Pathology Treatment: Dysphagia  Patient Details Name: [name] MRN: [id number] DOB: [birth year] Today's Date: 10/05/2017 Time: 4098-1191 SLP Time Calculation (min) (ACUTE ONLY): 9 min  Assessment / Plan / Recommendation Clinical Impression  Follow up for potential texture upgrade and appropriateness for repeat MBS. Manipulated graham cracker without overt difficulties for transit and no immediate cough with cracker and honey thick juice however heard coughing several minutes after session. He required min verbal cues for small sips, reason for thickener and precautions. Recommend MBS tomorrow for potential texture and liquid grade.     HPI HPI: 62 yo M with History of paroxysmal A. Fib, DM, CHF, CAD, CABG 2017, carotid stenosis (S/P L Carotid Endarterectomy), GERD, CVA, COPD, HTN, Abdominal Hernia, and Diabetes presented  generalized weakness and was found to have flu and was in A. Fib with RVR. Developed AKI requiring transient CRRT. Acute hypoxemic respiratory failure with bilateral pulmonary infiltrates. Intubated 2/12-2/21. CXR 2/22 increasing interstitial prominence, likely interstitial edema. Increasing bibasilar atelectasis. BSE 08/12/16 primary esophageal dysphagia. Esophagram same day no esophageal obstruction or stricture. Presbyesophagus. penetration of barium to the level of the vocal cords occurred during the exam and did elicit intermittent coughing. Recommend followup speech pathology evaluation to evaluate for aspiration risk.      SLP Plan  MBS;Continue with current plan of care       Recommendations  Diet recommendations: Dysphagia 2 (fine chop);Honey-thick liquid Liquids provided via: Cup Medication Administration: Whole meds with puree Supervision: Patient able to self feed;Intermittent supervision to cue for compensatory strategies Compensations: Minimize environmental distractions;Slow rate;Small sips/bites;Clear throat intermittently Postural  Changes and/or Swallow Maneuvers: Seated upright 90 degrees                Oral Care Recommendations: Oral care BID Follow up Recommendations: Skilled Nursing facility SLP Visit Diagnosis: Dysphagia, pharyngeal phase (R13.13) Plan: MBS;Continue with current plan of care                      Houston Siren 10/05/2017, 1:20 PM   Orbie Pyo Colvin Caroli[employer] [phone]

## 2017-10-05 NOTE — Progress Notes (Signed)
Subjective: Interval History: has no complaint,still coughing.  Objective: Vital signs in last 24 hours: Temp:  [97.6 F (36.4 C)-98.2 F (36.8 C)] 97.9 F (36.6 C) (02/27 2344) Pulse Rate:  [59-70] 59 (02/28 0413) Resp:  [16-19] 19 (02/28 0413) BP: (99-157)/(47-82) 157/68 (02/28 0413) SpO2:  [95 %-100 %] 96 % (02/28 0413) Weight:  [81.2 kg (179 lb)-84.5 kg (186 lb 4.6 oz)] 81.2 kg (179 lb) (02/28 0413) Weight change: -0.7 kg (-8.7 oz)  Intake/Output from previous day: 02/27 0701 - 02/28 0700 In: 3 [I.V.:3] Out: 2821 [Urine:225] Intake/Output this shift: No intake/output data recorded.  General appearance: alert, cooperative, no distress, moderately obese and slowed mentation Resp: diminished breath sounds bilaterally and rhonchi bilaterally Chest wall: PC Cardio: S1, S2 normal and systolic murmur: systolic ejection 2/6, decrescendo at 2nd left intercostal space GI: obese, assym, pos bs Extremities: edema 2-3+  Lab Results: Recent Labs    10/04/17 1117 10/05/17 0323  WBC 6.3 5.2  HGB 8.6* 8.3*  HCT 27.5* 26.7*  PLT 140* 137*   BMET:  Recent Labs    10/04/17 1117 10/05/17 0323  NA 137 136  K 3.8 3.9  CL 99* 99*  CO2 25 27  GLUCOSE 106* 90  BUN 32* 11  CREATININE 3.73* 2.17*  CALCIUM 8.8* 8.4*   No results for input(s): PTH in the last 72 hours. Iron Studies: No results for input(s): IRON, TIBC, TRANSFERRIN, FERRITIN in the last 72 hours.  Studies/Results: No results found.  I have reviewed the patient's current medications.  Assessment/Plan: 1 AKI HD yest, more for vol than solute. Still xs vol.  Will follow preHD Cr, and vol closely 2 Anemia on esa/Fe 3 Pneu/flu 4 Sepsis resolved 5 Tachybrady stable 6 COPD 7 CAD 8 Severe debill P follow urine and chem, neg balance goal, cont esa,      LOS: 20 days   [name] 10/05/2017,8:59 AM

## 2017-10-05 NOTE — Progress Notes (Signed)
Internal Medicine Attending  Date: 10/05/2017  Patient name: [name] Medical record number: [id number] Date of birth: [birth year] Age: 62 y.o. Gender: male  I saw and evaluated the patient. I reviewed the resident's note by Dr. Trilby Drummer and I agree with the resident's findings and plans as documented in his progress note.  When seen on rounds this morning [name] was sleepy but arousable. When asked why, he stated he was up half the night. When asked if he was asleep for a good portion of the day he admitted to this being the case. We stressed the importance of him trying to stay awake during the day so that he could get back on a day-night schedule. We have made several modifications in order to help him get to a day night schedule and protect his sleep time at night. Another important issue for him has been physical therapy and it appears they have been working with him. From the notes he appears cooperative and this bodes well to his potential rehabilitation. Finally, we are waiting recovery of his renal function after an episode of acute tubular necrosis secondary to sepsis. This is occurring, although slowly and we are hopeful that he will soon not require hemodialysis. It seems as though he has a bed available at [facility]. If they perform hemodialysis there, he may be ready for transfer to [facility] to continue with his rehabilitation and follow-up with the issue of hemodialysis and hopefully weaning off altogether.

## 2017-10-06 ENCOUNTER — Inpatient Hospital Stay (HOSPITAL_COMMUNITY): Payer: Medicare (Managed Care)

## 2017-10-06 DIAGNOSIS — R600 Localized edema: Secondary | ICD-10-CM

## 2017-10-06 LAB — RENAL FUNCTION PANEL
ALBUMIN: 2.5 g/dL — AB (ref 3.5–5.0)
Anion gap: 11 (ref 5–15)
BUN: 16 mg/dL (ref 6–20)
CO2: 27 mmol/L (ref 22–32)
Calcium: 8.7 mg/dL — ABNORMAL LOW (ref 8.9–10.3)
Chloride: 99 mmol/L — ABNORMAL LOW (ref 101–111)
Creatinine, Ser: 3.16 mg/dL — ABNORMAL HIGH (ref 0.61–1.24)
GFR calc Af Amer: 23 mL/min — ABNORMAL LOW (ref >60)
GFR calc non Af Amer: 19 mL/min — ABNORMAL LOW (ref >60)
GLUCOSE: 84 mg/dL (ref 65–99)
PHOSPHORUS: 3.2 mg/dL (ref 2.5–4.6)
POTASSIUM: 3.9 mmol/L (ref 3.5–5.1)
SODIUM: 137 mmol/L (ref 135–145)

## 2017-10-06 LAB — BPAM RBC
BLOOD PRODUCT EXPIRATION DATE: 201903032359
BLOOD PRODUCT EXPIRATION DATE: 201903042359
Blood Product Expiration Date: 201903222359
ISSUE DATE / TIME: 201902251102
ISSUE DATE / TIME: 201902281157
UNIT TYPE AND RH: 7300
UNIT TYPE AND RH: 7300
Unit Type and Rh: 7300

## 2017-10-06 LAB — TYPE AND SCREEN
ABO/RH(D): B POS
Antibody Screen: NEGATIVE
UNIT DIVISION: 0
Unit division: 0
Unit division: 0

## 2017-10-06 LAB — GLUCOSE, CAPILLARY
Glucose-Capillary: 103 mg/dL — ABNORMAL HIGH (ref 65–99)
Glucose-Capillary: 115 mg/dL — ABNORMAL HIGH (ref 65–99)
Glucose-Capillary: 82 mg/dL (ref 65–99)
Glucose-Capillary: 84 mg/dL (ref 65–99)
Glucose-Capillary: 95 mg/dL (ref 65–99)

## 2017-10-06 LAB — CBC
HCT: 28.3 % — ABNORMAL LOW (ref 39.0–52.0)
Hemoglobin: 8.7 g/dL — ABNORMAL LOW (ref 13.0–17.0)
MCH: 27.7 pg (ref 26.0–34.0)
MCHC: 30.7 g/dL (ref 30.0–36.0)
MCV: 90.1 fL (ref 78.0–100.0)
PLATELETS: 156 10*3/uL (ref 150–400)
RBC: 3.14 MIL/uL — AB (ref 4.22–5.81)
RDW: 18.9 % — ABNORMAL HIGH (ref 11.5–15.5)
WBC: 6.4 10*3/uL (ref 4.0–10.5)

## 2017-10-06 LAB — PROTIME-INR
INR: 1.81
PROTHROMBIN TIME: 20.8 s — AB (ref 11.4–15.2)

## 2017-10-06 LAB — HEPARIN LEVEL (UNFRACTIONATED): Heparin Unfractionated: 0.5 IU/mL (ref 0.30–0.70)

## 2017-10-06 MED ORDER — WARFARIN SODIUM 4 MG PO TABS
4.0000 mg | ORAL_TABLET | Freq: Once | ORAL | Status: AC
  Administered 2017-10-06: 4 mg via ORAL
  Filled 2017-10-06: qty 1

## 2017-10-06 MED ORDER — SODIUM CHLORIDE 0.9 % IV SOLN: 100.0000 mL | INTRAVENOUS | Status: DC | PRN

## 2017-10-06 MED ORDER — ALTEPLASE 2 MG IJ SOLR: 2.0000 mg | Freq: Once | INTRAMUSCULAR | Status: DC | PRN

## 2017-10-06 MED ORDER — LIDOCAINE HCL (PF) 1 % IJ SOLN: 5.0000 mL | INTRAMUSCULAR | Status: DC | PRN

## 2017-10-06 MED ORDER — LIDOCAINE-PRILOCAINE 2.5-2.5 % EX CREA
1.0000 "application " | TOPICAL_CREAM | CUTANEOUS | Status: DC | PRN
  Filled 2017-10-06: qty 5

## 2017-10-06 MED ORDER — PENTAFLUOROPROP-TETRAFLUOROETH EX AERO: 1.0000 "application " | INHALATION_SPRAY | CUTANEOUS | Status: DC | PRN

## 2017-10-06 MED ORDER — HEPARIN SODIUM (PORCINE) 1000 UNIT/ML DIALYSIS
100.0000 [IU]/kg | INTRAMUSCULAR | Status: DC | PRN
  Filled 2017-10-06: qty 8

## 2017-10-06 MED ORDER — HEPARIN SODIUM (PORCINE) 1000 UNIT/ML DIALYSIS
1000.0000 [IU] | INTRAMUSCULAR | Status: DC | PRN
  Filled 2017-10-06: qty 1

## 2017-10-06 NOTE — Progress Notes (Signed)
Subjective: Interval History: has no complaint .  Objective: Vital signs in last 24 hours: Temp:  [97.2 F (36.2 C)-98.4 F (36.9 C)] 98.2 F (36.8 C) (03/01 0740) Pulse Rate:  [44-66] 60 (03/01 0330) Resp:  [17-22] 17 (03/01 0330) BP: (97-162)/(52-90) 109/90 (03/01 0740) SpO2:  [94 %-97 %] 97 % (03/01 0330) Weight:  [79.9 kg (176 lb 2.4 oz)] 79.9 kg (176 lb 2.4 oz) (03/01 0330) Weight change: -4.6 kg (-2.3 oz)  Intake/Output from previous day: 02/28 0701 - 03/01 0700 In: 497.3 [I.V.:497.3] Out: 250 [Urine:250] Intake/Output this shift: No intake/output data recorded.  General appearance: alert, cooperative, no distress, moderately obese and pale Resp: rhonchi bilaterally Chest wall: RIJ cath Cardio: S1, S2 normal and systolic murmur: systolic ejection 2/6, blowing at 2nd left intercostal space GI: obese, pos bs, assym Extremities: edema 2+  Lab Results: Recent Labs    10/05/17 0323 10/06/17 0350  WBC 5.2 6.4  HGB 8.3* 8.7*  HCT 26.7* 28.3*  PLT 137* 156   BMET:  Recent Labs    10/05/17 0323 10/06/17 0350  NA 136 137  K 3.9 3.9  CL 99* 99*  CO2 27 27  GLUCOSE 90 84  BUN 11 16  CREATININE 2.17* 3.16*  CALCIUM 8.4* 8.7*   No results for input(s): PTH in the last 72 hours. Iron Studies: No results for input(s): IRON, TIBC, TRANSFERRIN, FERRITIN in the last 72 hours.  Studies/Results: No results found.  I have reviewed the patient's current medications.  Assessment/Plan: 1 AKI sepis, tachybrady, hemodynamic.  Output recorded is small but urine bag full???.  Has vol xs so will dialyze.  Predialysis Cr trending down so indicates some function. 2 Anemia has gotten iv Fe, and on esa 3 COPD  Much secretions 4 Obesity 5 Debill 6 CAD P Hd, esa, follow Crs over weekend, and vol   LOS: 21 days   [name] 10/06/2017,9:28 AM

## 2017-10-06 NOTE — Progress Notes (Signed)
ANTICOAGULATION CONSULT NOTE - Follow Up Consult  Pharmacy Consult for Heparin/Coumadin Indication: atrial fibrillation  Allergies  Allergen Reactions  . Penicillins Hives, Nausea And Vomiting, Swelling and Other (See Comments)    Tolerated Cefepime Has patient had a PCN reaction causing immediate rash, facial/tongue/throat swelling, SOB or lightheadedness with hypotension: YES Has patient had a PCN reaction causing severe rash involving mucus membranes or skin necrosis: No Has patient had a PCN reaction that required hospitalization No Has patient had a PCN reaction occurring within the last 10 years: No If all of the above answers are "NO", then may proceed with Cephalosporin use.     Patient Measurements: Height: 5' (152.4 cm) Weight: 176 lb 2.4 oz (79.9 kg) IBW/kg (Calculated) : 50 Heparin Dosing Weight: 68.7 kg   Vital Signs: Temp: 98.2 F (36.8 C) (03/01 0740) Temp Source: Oral (03/01 0740) BP: 109/90 (03/01 0740) Pulse Rate: 60 (03/01 0330)  Labs: Recent Labs    10/04/17 1117 10/04/17 2137 10/05/17 0323 10/06/17 0350  HGB 8.6*  --  8.3* 8.7*  HCT 27.5*  --  26.7* 28.3*  PLT 140*  --  137* 156  LABPROT 13.7  --  14.4 20.8*  INR 1.06  --  1.13 1.81  HEPARINUNFRC 0.22* 0.52 0.45 0.50  CREATININE 3.73*  --  2.17* 3.16*    Estimated Creatinine Clearance: 21 mL/min (A) (by C-G formula based on SCr of 3.16 mg/dL (H)).  Assessment: 53 yom on Xarelto 15mg  PTA for Afib (on med rec), not addressed initially in H&P by teaching service and Cards note says pt is NOT on anticoag 2/2 GIB in past. Patient currently continuing on heparin and Coumadin for afib. Heparin level remains therapeutic. INR trend up to 1.81 - larger jump from previous days. Watch DDI with amiodarone. Noted hx of chronic anemia with serial Feraheme infusions. Hg low but stable, plt up to 156. No active bleed issues documented. SCr trending back up - Renal watching for now, last iHD 2/27.  Goal of  Therapy:  Heparin level 0.3-0.7 units/ml  INR 2-3 Monitor platelets by anticoagulation protocol: Yes   Plan:  - Continue IV heparin to 1550 units/hr. - Coumadin 4mg  po x 1 tonight - Daily heparin level/INR/CBC - Monitor for s/sx bleeding  Elicia Lamp, PharmD, BCPS Clinical Pharmacist Clinical phone for 10/06/2017 until 3:30pm: [phone] If after 3:30pm, please call main pharmacy at: [phone] 10/06/2017 8:54 AM

## 2017-10-06 NOTE — Progress Notes (Signed)
Nutrition Follow-up  DOCUMENTATION CODES:   Obesity unspecified  INTERVENTION:    Continue Magic cup TID with meals, each supplement provides 290 kcal and 9 grams of protein  NUTRITION DIAGNOSIS:   Increased nutrient needs related to acute illness, wound healing as evidenced by estimated needs, ongoing   GOAL:   Patient will meet greater than or equal to 90% of their needs, progressing  MONITOR:   PO intake, Supplement acceptance, Labs, Weight trends, Skin  ASSESSMENT:   2/20 iHD started 2/21 Extubated 2/22 2nd HD treatment today, net UF 2 L 2/22 Diet advanced to Dysphagia II, Honey Thick  Pt s/p MBSS today. Diet advanced to Dys 3 nectar thick liquids. PO intake variable at 25-50% per flowsheet records. Receiving Magic Cup dessert supplement on trays. Medications include Rena-Vit and Colace. Labs reviewed. CBG's [id number].  Diet Order:  DIET DYS 3 Room service appropriate? Yes; Fluid consistency: Nectar Thick  EDUCATION NEEDS:   No education needs have been identified at this time  Skin:  Skin Assessment: Skin Integrity Issues: Skin Integrity Issues:: Other (Comment) DTI: sacrum Other: MASD: groin, buttocks, coccyx  Last BM:  2/27   Intake/Output Summary (Last 24 hours) at 10/06/2017 1312 Last data filed at 10/06/2017 1004 Gross per 24 hour  Intake 617.33 ml  Output 250 ml  Net 367.33 ml   Height:   Ht Readings from Last 1 Encounters:  09/25/17 5' (1.524 m)   Weight:   Wt Readings from Last 1 Encounters:  10/06/17 176 lb 2.4 oz (79.9 kg)   Ideal Body Weight:  48.1 kg  BMI:  Body mass index is 34.4 kg/m.  Estimated Nutritional Needs:   Kcal:  1900-2100  Protein:  100-115 gm  Fluid:  1000 ml plus UOP  Arthur Holms, RD, LDN Pager #: [phone] After-Hours Pager #: [phone]

## 2017-10-06 NOTE — Progress Notes (Signed)
.     Subjective:  Patient seen laying flat in bed this AM in no acute distress. Patient states he is very tired today and didn't get much sleep last night. Patient was encouraged to get up and out of bed today to help with overall clinical status. No acute complaints.  Objective:  Vital signs in last 24 hours: Vitals:   10/06/17 0330 10/06/17 0740 10/06/17 1142 10/06/17 1210  BP: 97/64 109/90 (!) 129/55   Pulse: 60  64   Resp: 17  18   Temp: 98.1 F (36.7 C) 98.2 F (36.8 C) 98.7 F (37.1 C)   TempSrc: Oral Oral Oral   SpO2: 97%  94% 91%  Weight: 176 lb 2.4 oz (79.9 kg)     Height:       Physical Exam  Constitutional:  Chronically sick appearing elderly gentleman laying comfortably in bed in no acute distress.  HENT:  Mouth/Throat: Oropharynx is clear and moist. No oropharyngeal exudate.  Cardiovascular: Normal rate, regular rhythm and intact distal pulses. Exam reveals no friction rub.  No murmur heard. Respiratory: Effort normal. No respiratory distress. He has no wheezes. He has no rales.  GI: Soft. He exhibits no distension. There is no tenderness. There is no rebound.  Musculoskeletal: He exhibits edema (1-2+ sacral edema). He exhibits no tenderness (with palpation of lower extremities).  Skin: Skin is warm and dry. No rash noted. No erythema.   Assessment Plan:  Principal Problem:   Acute respiratory failure with hypoxia (HCC) Active Problems:   DM (diabetes mellitus), type 2, uncontrolled ([place])   Essential hypertension   Paroxysmal atrial fibrillation with rapid ventricular response (HCC)   COPD (chronic obstructive pulmonary disease) (HCC)   Chronic combined systolic and diastolic CHF (congestive heart failure) (HCC)   Ischemic cardiomyopathy   Right bundle branch block (RBBB) on electrocardiography   Tachy-brady syndrome (HCC)   Sinus pause   Influenza with respiratory manifestation   Aspiration pneumonia of both lower lobes (HCC)   AKI (acute kidney injury)  ([place])   Pressure injury of skin   Anemia of chronic disease  PAF with RVR, Tachy-Brady Syndrome: Patient currently in NSR. One self limited episode of RVR last night. Cardiology to follow up as an outpatient for pacemaker placement. Patient will be bridged to warfarin this admission -Bridge from heparin to Warfarin for anticoagulation, current INR = 1.8 -Avoid metoprolol to prevent any bradycardia -Amiodarone 200mg  BID  Acute on Chronic Renal Failure: Patient is no longer oliguric, 250 mL output yesterday. On intermittent HD via tunneled dialysis cather placed 2/25. -Nephrology is following -Will need to discuss if patient will continue on dialysis as an outpatient and what his schedule will be like  Acute Respiratory Failure 2/2 recent influenza, pneumonia: Respiratory status continues to improve daily. Saturating >90% on less than 2L [place] during exam today. -Continue monitoring respiratory status -Incentive spirometry and flutter valve -No O2 > 2L unless sats <90% -Out of bed to chair when able  Chronic anemia: Patient's Hgb stable around 8.0 for multiple days. -Getting Aranesp with nephrology -Ferrous Sulfate 325mg  Daily  DM: Current A1c on 09/28/17 was 7.9.  -Continue Lantus 15 units at bedtime with SSI.  FEN/GI: -Dysphagia 2 diet with honey thick liquid per SLP  -No IVF, replace electrolytes as needed  VTE Prophylaxis: Heparin TID Code Status: Full  Dispo: Anticipated discharge pending clinical improvement.   Thomasene Ripple, MD 10/06/2017, 1:36 PM Pager: [phone]

## 2017-10-06 NOTE — Progress Notes (Signed)
Pt declines CPT.

## 2017-10-06 NOTE — Progress Notes (Signed)
Received call from CCMD that pt desaturating into the 80s. Upon arrival to room pt in no apparent distress. Pt with congested cough, unable to cough up sputum. Pt O2 82 on 2L. O2 turned up from 2L to 5L. O2 sat improved from 80 to 87. Given scheduled Duoneb. Dr. Berneice Gandy made aware. Lungs with bilateral expiratory wheeze. Pt returned to 3L [place], O2 100%. Will continue to monitor.   Fritz Pickerel, RN

## 2017-10-06 NOTE — Procedures (Signed)
I was present at this session.  I have reviewed the session itself and made appropriate changes.  HD via IJ PC.  bp low 100s, vol xs.    Jeneen Rinks Camdyn Beske 3/1/20192:46 PM

## 2017-10-06 NOTE — Progress Notes (Signed)
Modified Barium Swallow Progress Note  Patient Details  Name: [name] MRN: [id number] Date of Birth: 1955/12/03  Today's Date: 10/06/2017  Modified Barium Swallow completed.  Full report located under Chart Review in the Imaging Section.  Brief recommendations include the following:  Clinical Impression  Improved swallow function with fewer episodes of penetration and with thinner consistencies and intact strength during this study. Intermittent mistiming of protective mechanism engagement led to silent penetration (transient and once on vocal cords) during challenging consecutive sips thin cleared with prompts to cough/throat clear. Verbal cues for smaller sips continue to pose aspiration risk given improved but at risk respiratory status. Slower but functional mastication with solid with potential for fatigue with regular texture therefore recommend upgrade to Dys 3 and nectar consistencies, intermittent throat clear, rest breaks if needed and pills whole in puree.    Swallow Evaluation Recommendations       SLP Diet Recommendations: Dysphagia 3 (Mech soft) solids;Nectar thick liquid   Liquid Administration via: Cup;Straw   Medication Administration: Whole meds with puree   Supervision: Patient able to self feed;Full supervision/cueing for compensatory strategies   Compensations: Minimize environmental distractions;Slow rate;Small sips/bites;Clear throat intermittently   Postural Changes: Seated upright at 90 degrees   Oral Care Recommendations: Oral care BID        Houston Siren 10/06/2017,11:57 AM   Orbie Pyo Colvin Caroli[employer] [phone]

## 2017-10-07 LAB — GLUCOSE, CAPILLARY
GLUCOSE-CAPILLARY: 81 mg/dL (ref 65–99)
GLUCOSE-CAPILLARY: 67 mg/dL (ref 65–99)
GLUCOSE-CAPILLARY: 118 mg/dL — AB (ref 65–99)
Glucose-Capillary: 77 mg/dL (ref 65–99)
Glucose-Capillary: 78 mg/dL (ref 65–99)
Glucose-Capillary: 83 mg/dL (ref 65–99)

## 2017-10-07 LAB — HEPARIN LEVEL (UNFRACTIONATED): Heparin Unfractionated: 0.53 IU/mL (ref 0.30–0.70)

## 2017-10-07 LAB — RENAL FUNCTION PANEL
ALBUMIN: 2.6 g/dL — AB (ref 3.5–5.0)
Anion gap: 10 (ref 5–15)
BUN: 7 mg/dL (ref 6–20)
CALCIUM: 8.5 mg/dL — AB (ref 8.9–10.3)
CHLORIDE: 100 mmol/L — AB (ref 101–111)
CO2: 28 mmol/L (ref 22–32)
CREATININE: 2.03 mg/dL — AB (ref 0.61–1.24)
GFR, EST AFRICAN AMERICAN: 38 mL/min — AB (ref >60)
GFR, EST NON AFRICAN AMERICAN: 33 mL/min — AB (ref >60)
Glucose, Bld: 87 mg/dL (ref 65–99)
Phosphorus: 2.3 mg/dL — ABNORMAL LOW (ref 2.5–4.6)
Potassium: 4.4 mmol/L (ref 3.5–5.1)
SODIUM: 138 mmol/L (ref 135–145)

## 2017-10-07 LAB — PROTIME-INR
INR: 2.57
Prothrombin Time: 27.4 seconds — ABNORMAL HIGH (ref 11.4–15.2)

## 2017-10-07 LAB — CBC
HCT: 28.4 % — ABNORMAL LOW (ref 39.0–52.0)
Hemoglobin: 8.6 g/dL — ABNORMAL LOW (ref 13.0–17.0)
MCH: 27.6 pg (ref 26.0–34.0)
MCHC: 30.3 g/dL (ref 30.0–36.0)
MCV: 91 fL (ref 78.0–100.0)
PLATELETS: 139 10*3/uL — AB (ref 150–400)
RBC: 3.12 MIL/uL — AB (ref 4.22–5.81)
RDW: 19.6 % — AB (ref 11.5–15.5)
WBC: 6 10*3/uL (ref 4.0–10.5)

## 2017-10-07 MED ORDER — WARFARIN SODIUM 2 MG PO TABS
2.0000 mg | ORAL_TABLET | Freq: Once | ORAL | Status: AC
  Administered 2017-10-07: 2 mg via ORAL
  Filled 2017-10-07: qty 1

## 2017-10-07 NOTE — Progress Notes (Signed)
.     Subjective:  Patient more awake than he's been last couple of days. He denies shortness of breath, chest pain, abd pain. He has mild cough which is seldom productive of white sputum.   Objective:  Vital signs in last 24 hours: Vitals:   10/07/17 0840 10/07/17 0852 10/07/17 1210 10/07/17 1255  BP: (!) 170/58 (!) 170/58 (!) 171/81   Pulse: 63 65 (!) 130   Resp: 12 12 20    Temp: 98.4 F (36.9 C)  97.7 F (36.5 C)   TempSrc: Oral  Axillary   SpO2: 96% 100% 92% 94%  Weight:      Height:       Constitutional: NAD, lying in bed comfortably CV: RRR, systolic murmur, no rubs or gallops Resp: Mild bibasilar crackles, no increased work of breathing Abd: soft, NDNT, +BS Ext: Pitting edema to upper thigh - improved today  Assessment Plan:  Principal Problem:   Acute respiratory failure with hypoxia (HCC) Active Problems:   DM (diabetes mellitus), type 2, uncontrolled (HCC)   Essential hypertension   Paroxysmal atrial fibrillation with rapid ventricular response (HCC)   COPD (chronic obstructive pulmonary disease) (HCC)   Chronic combined systolic and diastolic CHF (congestive heart failure) (HCC)   Ischemic cardiomyopathy   Right bundle branch block (RBBB) on electrocardiography   Tachy-brady syndrome (HCC)   Sinus pause   Influenza with respiratory manifestation   Aspiration pneumonia of both lower lobes (HCC)   AKI (acute kidney injury) (HCC)   Pressure injury of skin   Anemia of chronic disease  PAF with RVR, Tachy-Brady Syndrome:  One self limited episode of RVR last night. Cardiology to follow up as an outpatient for pacemaker placement. This morning was SR now a fib rates ranging from 88-140s during re-eval - asymptomatic. -warfarin therapeutic today -Avoid metoprolol to prevent any bradycardia -Amiodarone 200mg  BID  Acute on Chronic Renal Failure:On intermittent HD via tunneled dialysis cather placed 2/25. Overall post-HD Cr trending down hopeful for renal recovery  in the future. -Nephrology is following  Chronic anemia: Patient's Hgb stable around 8.0 for multiple days. -Getting Aranesp with nephrology -Ferrous Sulfate 325mg  Daily  DM: Current A1c on 09/28/17 was 7.9.  -Continue Lantus 15 units at bedtime with SSI.  FEN/GI: -Dysphagia 2 diet with honey thick liquid per SLP  -No IVF, replace electrolytes as needed  VTE Prophylaxis: Heparin TID Code Status: Full  Dispo: Anticipated discharge pending clinical improvement.   Alphonzo Grieve, MD 10/07/2017, 1:32 PM Pager: [phone]

## 2017-10-07 NOTE — Progress Notes (Addendum)
ANTICOAGULATION CONSULT NOTE - Follow Up Consult  Pharmacy Consult for Heparin/Coumadin Indication: atrial fibrillation  Allergies  Allergen Reactions  . Penicillins Hives, Nausea And Vomiting, Swelling and Other (See Comments)    Tolerated Cefepime Has patient had a PCN reaction causing immediate rash, facial/tongue/throat swelling, SOB or lightheadedness with hypotension: YES Has patient had a PCN reaction causing severe rash involving mucus membranes or skin necrosis: No Has patient had a PCN reaction that required hospitalization No Has patient had a PCN reaction occurring within the last 10 years: No If all of the above answers are "NO", then may proceed with Cephalosporin use.     Patient Measurements: Height: 5' (152.4 cm) Weight: 173 lb 4.5 oz (78.6 kg) IBW/kg (Calculated) : 50 Heparin Dosing Weight: 68.7 kg   Vital Signs: Temp: 98.4 F (36.9 C) (03/02 0840) Temp Source: Oral (03/02 0840) BP: 170/58 (03/02 0852) Pulse Rate: 65 (03/02 0852)  Labs: Recent Labs    10/05/17 0323 10/06/17 0350 10/07/17 0407  HGB 8.3* 8.7* 8.6*  HCT 26.7* 28.3* 28.4*  PLT 137* 156 139*  LABPROT 14.4 20.8* 27.4*  INR 1.13 1.81 2.57  HEPARINUNFRC 0.45 0.50 0.53  CREATININE 2.17* 3.16* 2.03*    Estimated Creatinine Clearance: 32.3 mL/min (A) (by C-G formula based on SCr of 2.03 mg/dL (H)).  Assessment: 36 yom on Xarelto 15mg  PTA for Afib (on med rec), not addressed initially in H&P by teaching service and Cards note says pt is NOT on anticoag 2/2 GIB in past. Patient currently continuing on heparin and Coumadin for afib. Heparin level remains therapeutic. INR trend up to 2.57 after 4 doses - large jump last 2 days (likely due to DDI with amiodarone). Noted hx of chronic anemia with serial Feraheme infusions. Hg low but stable, plt back down to 139. No active bleed issues documented. SCr trending back up - Renal watching for now, last iHD 3/1.  Goal of Therapy:  Heparin level 0.3-0.7  units/ml  INR 2-3 Monitor platelets by anticoagulation protocol: Yes   Plan:  - D/c heparin with therapeutic INR, large jump over 2 days and hx of GIB - Coumadin 2mg  po x 1 - reduce dose again tonight - Daily INR - Monitor CBC, for s/sx bleeding, DDI with amiodarone  Elicia Lamp, PharmD, BCPS Clinical Pharmacist Clinical phone for 10/07/2017 until 3:30pm: [phone] If after 3:30pm, please call main pharmacy at: [phone] 10/07/2017 11:10 AM

## 2017-10-07 NOTE — Progress Notes (Signed)
Subjective: Interval History: has no complaint, feeling better.  Objective: Vital signs in last 24 hours: Temp:  [97.7 F (36.5 C)-98.7 F (37.1 C)] 98.4 F (36.9 C) (03/02 0840) Pulse Rate:  [58-65] 65 (03/02 0852) Resp:  [12-23] 12 (03/02 0852) BP: (93-170)/(46-72) 170/58 (03/02 0852) SpO2:  [91 %-100 %] 100 % (03/02 0852) Weight:  [78.6 kg (173 lb 4.5 oz)-79.8 kg (175 lb 14.8 oz)] 78.6 kg (173 lb 4.5 oz) (03/02 0404) Weight change: -0.1 kg (-3.5 oz)  Intake/Output from previous day: 03/01 0701 - 03/02 0700 In: 290.5 [P.O.:120; I.V.:170.5] Out: 1765 [Urine:150] Intake/Output this shift: No intake/output data recorded.  General appearance: alert, cooperative, no distress, moderately obese and pale Resp: diminished breath sounds bilaterally and rales bibasilar Chest wall: Ij cath PC Cardio: S1, S2 normal and systolic murmur: systolic ejection 2/6, decrescendo at 2nd left intercostal space Male genitalia: not eval Extremities: edema 3+  ABDM pos bs, soft, obese  Lab Results: Recent Labs    10/06/17 0350 10/07/17 0407  WBC 6.4 6.0  HGB 8.7* 8.6*  HCT 28.3* 28.4*  PLT 156 139*   BMET:  Recent Labs    10/06/17 0350 10/07/17 0407  NA 137 138  K 3.9 4.4  CL 99* 100*  CO2 27 28  GLUCOSE 84 87  BUN 16 7  CREATININE 3.16* 2.03*  CALCIUM 8.7* 8.5*   No results for input(s): PTH in the last 72 hours. Iron Studies: No results for input(s): IRON, TIBC, TRANSFERRIN, FERRITIN in the last 72 hours.  Studies/Results: Dg Swallowing Func-speech Pathology  Result Date: 10/06/2017 Objective Swallowing Evaluation: Type of Study: MBS-Modified Barium Swallow Study  Patient Details Name: [name] MRN: [id number] Date of Birth: [birth year] Today's Date: 10/06/2017 Time: SLP Start Time (ACUTE ONLY): 1037 -SLP Stop Time (ACUTE ONLY): 1058 SLP Time Calculation (min) (ACUTE ONLY): 21 min Past Medical History: Past Medical History: Diagnosis Date . Abdominal hernia   Chronic, not a  good surgical candidate . Abscess, abdomen 12/31/2010  Referred to Wound Care in 01/2011 because of multiple abd abscess with VERY large ventral hernia (please look at image of CT abd/pelvis 09/2010).  Because of hernia I was hesitant to I&D.     Marland Kitchen Anemia   History of Iron Def Anemia . Anxiety  . AVM (arteriovenous malformation)   chronic GI blood loss . Carotid artery disease ([place])   s/p CEA . Chronic diastolic CHF (congestive heart failure) (HCC)   takes Lasix . Chronic low back pain  . COPD (chronic obstructive pulmonary disease) ([place])  . CVA (cerebral infarction) 09/2010  Bilateral with Left > Right . Diabetes mellitus  . GERD (gastroesophageal reflux disease)  . History of nuclear stress test   Myoview 9/16:  Inferior, apical and inf-lateral ischemia; not gated; High Risk . Hx of echocardiogram   Echo 5/16:  Mild LVH, EF 55%, indeterm. diast function, WMA could not be ruled out, MAC, trivial MR, mild LAE, normal RVF //  b. Echo 4/17: EF 55-60%, no RWMA, Gr 2 DD, Ao sclerosis, MAC, mild MS, mild LAE, PASP 55 mHg . Hyperlipidemia  . Hypertension  . Intellectual disability   Sister helps to take care of him and takes him to appts . Itching   all over body; pt scratches and has sores on bilateral arms and abdomen . Lung nodule  . Myocardial infarction [facility]) 2016 ?  Heart attack  (  Per  pt. ) . Obesity  . Oxygen dependent   wears 2  liters at bedtime and when needed . PAF (paroxysmal atrial fibrillation) ([place]) 06/2009  CHADS score 2 (HTN, DM) // Pradaxa for Afib // Pradaxa stopped due to worsening anemia (Hgb in 5/18 8.5 >> Pradaxa remains on hold)  . Pneumonia   hx of . Stroke ([place])  . Tobacco user   Smokes 1ppd for multiple years.  Quit after hosp 09/2010. . Tubular adenoma of colon  . Wears glasses  Past Surgical History: Past Surgical History: Procedure Laterality Date . AMPUTATION Right 03/29/2013  Procedure: AMPUTATION RAY;  Surgeon: Newt Minion, MD;  Location: [place];  Service: Orthopedics;  Laterality: Right;   Right Foot 3rd and Possible 4th Ray Amputation . AMPUTATION Right 04/23/2013  Procedure: AMPUTATION RIGHT MID-FOOT;  Surgeon: Newt Minion, MD;  Location: [place];  Service: Orthopedics;  Laterality: Right; . arm surgery Left   as a child . CARDIAC CATHETERIZATION N/A 04/30/2015  Procedure: Left Heart Cath and Coronary Angiography;  Surgeon: Belva Crome, MD;  Location: [place] [facility];  Service: Cardiovascular;  Laterality: N/A; . Carotid arteriogram  10/2010  30% right ICA stenosis, 40% left ICA stenosis  . CAROTID ENDARTERECTOMY Left 08-13-15  CEA . CATARACT EXTRACTION, BILATERAL   . COLONOSCOPY WITH PROPOFOL N/A 06/10/2014  Procedure: COLONOSCOPY WITH PROPOFOL;  Surgeon: Jerene Bears, MD;  Location: [facility];  Service: Gastroenterology;  Laterality: N/A; . CORONARY ARTERY BYPASS GRAFT N/A 08/13/2015  Procedure: CORONARY ARTERY BYPASS GRAFTING (CABG), ON PUMP, TIMES THREE, USING LEFT INTERNAL MAMMARY ARTERY, RIGHT GREATER SAPHENOUS VEIN HARVESTED ENDOSCOPICALLY;  Surgeon: [name], MD;  Location: [place];  Service: Open Heart Surgery;  Laterality: N/A; . DEBRIDMENT OF DECUBITUS ULCER Right 02/13/2013 . ENDARTERECTOMY Left 08/13/2015  Procedure: ENDARTERECTOMY CAROTID;  Surgeon: Angelia Mould, MD;  Location: [place];  Service: Vascular;  Laterality: Left; . ESOPHAGOGASTRODUODENOSCOPY (EGD) WITH PROPOFOL N/A 06/10/2014  Procedure: ESOPHAGOGASTRODUODENOSCOPY (EGD) WITH PROPOFOL;  Surgeon: Jerene Bears, MD;  Location: [facility];  Service: Gastroenterology;  Laterality: N/A; . GIVENS CAPSULE STUDY N/A 07/09/2014  Procedure: GIVENS CAPSULE STUDY;  Surgeon: Jerene Bears, MD;  Location: [facility];  Service: Gastroenterology;  Laterality: N/A; . I&D EXTREMITY Right 02/13/2013  Procedure: IRRIGATION AND DEBRIDEMENT FOOT ULCER;  Surgeon: Johnny Bridge, MD;  Location: [place];  Service: Orthopedics;  Laterality: Right;  PULSE LAVAGE . INSERTION OF DIALYSIS CATHETER N/A 10/02/2017  Procedure: INSERTION OF TUNNELED  DIALYSIS CATHETER;  Surgeon: Elam Dutch, MD;  Location: [facility];  Service: Vascular;  Laterality: N/A; . MULTIPLE TOOTH EXTRACTIONS   . TEE WITHOUT CARDIOVERSION N/A 08/13/2015  Procedure: TRANSESOPHAGEAL ECHOCARDIOGRAM (TEE);  Surgeon: [name], MD;  Location: [place];  Service: Open Heart Surgery;  Laterality: N/A; . TEMPORARY PACEMAKER N/A 09/15/2017  Procedure: TEMPORARY PACEMAKER;  Surgeon: Jettie Booze, MD;  Location: [place] [facility];  Service: Cardiovascular;  Laterality: N/A; . TRANSESOPHAGEAL ECHOCARDIOGRAM  09/2010  No ASD or PFO. EF 60-65%.  Normal systolic function. No evidence of thrombus.  . TRANSTHORACIC ECHOCARDIOGRAM  09/2010   The cavity size was normal. Systolic function was vigorous.  EF 65-70%.  Normal wall funciton.  Marland Kitchen ULTRASOUND GUIDANCE FOR VASCULAR ACCESS  09/15/2017  Procedure: Ultrasound Guidance For Vascular Access;  Surgeon: Jettie Booze, MD;  Location: [place] [facility];  Service: Cardiovascular;; HPI: 62 yo M with History of paroxysmal A. Fib, DM, CHF, CAD, CABG 2017, carotid stenosis (S/P L Carotid Endarterectomy), GERD, CVA, COPD, HTN, Abdominal Hernia, and Diabetes  presented  generalized weakness and was found to have flu and was in A. Fib with RVR. Developed AKI requiring transient CRRT. Acute hypoxemic respiratory failure with bilateral pulmonary infiltrates. Intubated 2/12-2/21. CXR 2/22 increasing interstitial prominence, likely interstitial edema. Increasing bibasilar atelectasis. BSE 08/12/16 primary esophageal dysphagia. Esophagram same day no esophageal obstruction or stricture. Presbyesophagus. penetration of barium to the level of the vocal cords occurred during the exam and did elicit intermittent coughing. MBS 09/29/17 recommended D2, honey thick liquids. Repeat MBS recommended to assess diet/liquid upgrade.  No Data Recorded Assessment / Plan / Recommendation CHL IP CLINICAL IMPRESSIONS 10/06/2017 Clinical Impression Improved swallow function with  fewer episodes of penetration and with thinner consistencies and intact strength during this study. Intermittent mistiming of protective mechanism engagement led to silent penetration (transient and once on vocal cords) during challenging consecutive sips thin cleared with prompts to cough/throat clear. Verbal cues for smaller sips continue to pose aspiration risk given improved but at risk respiratory status. Slower but functional mastication with solid with potential for fatigue with regular texture therefore recommend upgrade to Dys 3 and nectar consistencies, intermittent throat clear, rest breaks if needed and pills whole in puree.  SLP Visit Diagnosis Dysphagia, pharyngeal phase (R13.13) Attention and concentration deficit following -- Frontal lobe and executive function deficit following -- Impact on safety and function Mild aspiration risk;Moderate aspiration risk   CHL IP TREATMENT RECOMMENDATION 10/06/2017 Treatment Recommendations Therapy as outlined in treatment plan below   Prognosis 10/06/2017 Prognosis for Safe Diet Advancement Good Barriers to Reach Goals -- Barriers/Prognosis Comment -- CHL IP DIET RECOMMENDATION 10/06/2017 SLP Diet Recommendations Dysphagia 3 (Mech soft) solids;Nectar thick liquid Liquid Administration via Cup;Straw Medication Administration Whole meds with puree Compensations Minimize environmental distractions;Slow rate;Small sips/bites;Clear throat intermittently Postural Changes Seated upright at 90 degrees   CHL IP OTHER RECOMMENDATIONS 10/06/2017 Recommended Consults -- Oral Care Recommendations Oral care BID Other Recommendations --   CHL IP FOLLOW UP RECOMMENDATIONS 10/06/2017 Follow up Recommendations Skilled Nursing facility   [facility] IP FREQUENCY AND DURATION 10/06/2017 Speech Therapy Frequency (ACUTE ONLY) min 2x/week Treatment Duration 2 weeks      CHL IP ORAL PHASE 10/06/2017 Oral Phase WFL Oral - Pudding Teaspoon -- Oral - Pudding Cup -- Oral - Honey Teaspoon -- Oral - Honey Cup WFL  Oral - Nectar Teaspoon -- Oral - Nectar Cup WFL Oral - Nectar Straw -- Oral - Thin Teaspoon -- Oral - Thin Cup WFL Oral - Thin Straw WFL Oral - Puree -- Oral - Mech Soft -- Oral - Regular WFL Oral - Multi-Consistency -- Oral - Pill -- Oral Phase - Comment --  CHL IP PHARYNGEAL PHASE 10/06/2017 Pharyngeal Phase Impaired Pharyngeal- Pudding Teaspoon -- Pharyngeal -- Pharyngeal- Pudding Cup -- Pharyngeal -- Pharyngeal- Honey Teaspoon -- Pharyngeal -- Pharyngeal- Honey [facility] Pharyngeal Material does not enter airway Pharyngeal- Nectar Teaspoon -- Pharyngeal -- Pharyngeal- Nectar Cup [facility] Pharyngeal Material does not enter airway Pharyngeal- Nectar Straw -- Pharyngeal -- Pharyngeal- Thin Teaspoon -- Pharyngeal -- Pharyngeal- Thin Cup Penetration/Aspiration during swallow;Reduced airway/laryngeal closure Pharyngeal Material enters airway, remains ABOVE vocal cords then ejected out;Material enters airway, remains ABOVE vocal cords and not ejected out;Material enters airway, CONTACTS cords and not ejected out Pharyngeal- Thin Straw Penetration/Aspiration during swallow Pharyngeal Material enters airway, remains ABOVE vocal cords and not ejected out Pharyngeal- Puree -- Pharyngeal -- Pharyngeal- Mechanical Soft WFL Pharyngeal -- Pharyngeal- Regular WFL Pharyngeal -- Pharyngeal- Multi-consistency -- Pharyngeal -- Pharyngeal- Pill -- Pharyngeal -- Pharyngeal Comment --  CHL IP CERVICAL ESOPHAGEAL PHASE 10/06/2017 Cervical Esophageal Phase WFL Pudding Teaspoon -- Pudding Cup -- Honey Teaspoon -- Honey Cup -- Nectar Teaspoon -- Nectar Cup -- Nectar Straw -- Thin Teaspoon -- Thin Cup -- Thin Straw -- Puree -- Mechanical Soft -- Regular -- Multi-consistency -- Pill -- Cervical Esophageal Comment -- No flowsheet data found. Houston Siren 10/06/2017, 11:56 AM  Orbie Pyo Colvin Caroli.Ed CCC-SLP Pager [phone]              I have reviewed the patient's current medications.  Assessment/Plan: 1 AKI ?? Urine vol Hd yest mostly for  vol (down 6 kg this week).  Still xs vol.  Will follow chem, vol.  Will need diuresis if can stay off dialysis. Need accurate I&O 2 Anemia esa ,got iv Fe 3 DM controlled 4 COPD 5 Tachy Brady ok  6 Sepsis controlled 7 CAD P follow daily chem, if can stay off HD start Lasix, cont esa.  Try to get I&O   LOS: 22 days   Jeneen Rinks Robbie Rideaux 10/07/2017,9:44 AM

## 2017-10-07 NOTE — Progress Notes (Signed)
Medicine attending: Clinical status and database reviewed with resident physician Dr. Alphonzo Grieve and I concur with her evaluation and management plan which we discussed together. We appreciate ongoing assistance from nephrology. The patient is still requiring intermittent dialysis.  62 year old man with multiple chronic medical conditions initially admitted on February 9 with generalized weakness cough, intermittent dizziness, and found to be in atrial fibrillation with a rapid rate.  Hypotension.  He developed intermittent sinus pauses up to 7 seconds with transient loss of consciousness and had to have a temporary pacemaker placed.  He was influenza A positive.  He subsequently developed hypoxic respiratory failure felt secondary to bilateral lower lobe aspiration pneumonia.  He was already oxygen dependent prior to admission.  He required intubation and temporary ventilator support in the intensive care unit setting.  Parenteral amiodarone to control his atrial tachycardia.  Dialysis initiated for acute on chronic oliguric renal failure on February 18. Renal function is stabilizing with expectation that he may get off dialysis.  He has been transitioned to oral antiarrhythmics.  Not felt stable at this time for permanent pacemaker placement.  We are avoiding beta blockers. He is now therapeutic on warfarin. Plan: Continue current level of support

## 2017-10-07 NOTE — Progress Notes (Signed)
Pt's HR sustaining 120s-140. BP elevated at 171/81. Pt resting in bed and has no complaints. MD notified. MD coming to assess pt shortly.    Grant Fontana BSN, RN

## 2017-10-08 LAB — GLUCOSE, CAPILLARY
GLUCOSE-CAPILLARY: 108 mg/dL — AB (ref 65–99)
GLUCOSE-CAPILLARY: 109 mg/dL — AB (ref 65–99)
GLUCOSE-CAPILLARY: 120 mg/dL — AB (ref 65–99)
GLUCOSE-CAPILLARY: 73 mg/dL (ref 65–99)
GLUCOSE-CAPILLARY: 86 mg/dL (ref 65–99)
Glucose-Capillary: 82 mg/dL (ref 65–99)

## 2017-10-08 LAB — CBC
HEMATOCRIT: 29 % — AB (ref 39.0–52.0)
Hemoglobin: 8.8 g/dL — ABNORMAL LOW (ref 13.0–17.0)
MCH: 28.4 pg (ref 26.0–34.0)
MCHC: 30.3 g/dL (ref 30.0–36.0)
MCV: 93.5 fL (ref 78.0–100.0)
Platelets: 151 10*3/uL (ref 150–400)
RBC: 3.1 MIL/uL — AB (ref 4.22–5.81)
RDW: 20.6 % — ABNORMAL HIGH (ref 11.5–15.5)
WBC: 6.5 10*3/uL (ref 4.0–10.5)

## 2017-10-08 LAB — RENAL FUNCTION PANEL
ANION GAP: 12 (ref 5–15)
Albumin: 2.6 g/dL — ABNORMAL LOW (ref 3.5–5.0)
BUN: 18 mg/dL (ref 6–20)
CO2: 23 mmol/L (ref 22–32)
Calcium: 8.7 mg/dL — ABNORMAL LOW (ref 8.9–10.3)
Chloride: 101 mmol/L (ref 101–111)
Creatinine, Ser: 3.32 mg/dL — ABNORMAL HIGH (ref 0.61–1.24)
GFR calc non Af Amer: 18 mL/min — ABNORMAL LOW (ref >60)
GFR, EST AFRICAN AMERICAN: 21 mL/min — AB (ref >60)
Glucose, Bld: 83 mg/dL (ref 65–99)
PHOSPHORUS: 3.1 mg/dL (ref 2.5–4.6)
POTASSIUM: 4.8 mmol/L (ref 3.5–5.1)
SODIUM: 136 mmol/L (ref 135–145)

## 2017-10-08 LAB — PROTIME-INR
INR: 2.47
Prothrombin Time: 26.6 seconds — ABNORMAL HIGH (ref 11.4–15.2)

## 2017-10-08 MED ORDER — WARFARIN SODIUM 4 MG PO TABS
4.0000 mg | ORAL_TABLET | Freq: Once | ORAL | Status: AC
  Administered 2017-10-08: 4 mg via ORAL
  Filled 2017-10-08: qty 1

## 2017-10-08 MED ORDER — INSULIN GLARGINE 100 UNIT/ML ~~LOC~~ SOLN
14.0000 [IU] | Freq: Every day | SUBCUTANEOUS | Status: DC
  Administered 2017-10-08 – 2017-10-25 (×16): 14 [IU] via SUBCUTANEOUS
  Filled 2017-10-08 (×19): qty 0.14

## 2017-10-08 NOTE — Progress Notes (Signed)
ANTICOAGULATION CONSULT NOTE - Follow Up Consult  Pharmacy Consult for Coumadin Indication: atrial fibrillation  Allergies  Allergen Reactions  . Penicillins Hives, Nausea And Vomiting, Swelling and Other (See Comments)    Tolerated Cefepime Has patient had a PCN reaction causing immediate rash, facial/tongue/throat swelling, SOB or lightheadedness with hypotension: YES Has patient had a PCN reaction causing severe rash involving mucus membranes or skin necrosis: No Has patient had a PCN reaction that required hospitalization No Has patient had a PCN reaction occurring within the last 10 years: No If all of the above answers are "NO", then may proceed with Cephalosporin use.     Patient Measurements: Height: 5' (152.4 cm) Weight: 173 lb 15.1 oz (78.9 kg) IBW/kg (Calculated) : 50 Heparin Dosing Weight: 68.7 kg   Vital Signs: Temp: 98.2 F (36.8 C) (03/03 0938) Temp Source: Oral (03/03 0938) BP: 150/58 (03/03 1125) Pulse Rate: 66 (03/03 0938)  Labs: Recent Labs    10/06/17 0350 10/07/17 0407 10/08/17 0904  HGB 8.7* 8.6* 8.8*  HCT 28.3* 28.4* 29.0*  PLT 156 139* 151  LABPROT 20.8* 27.4* 26.6*  INR 1.81 2.57 2.47  HEPARINUNFRC 0.50 0.53  --   CREATININE 3.16* 2.03*  --     Estimated Creatinine Clearance: 32.5 mL/min (A) (by C-G formula based on SCr of 2.03 mg/dL (H)).  Assessment: 71 yom on Xarelto 15mg  PTA for Afib (on med rec), not addressed initially in H&P by teaching service and Cards note says pt is NOT on anticoag 2/2 GIB in past.   Patient currently continuing therapeutic Coumadin for afib. Heparin bridge d/c'd on 3/2. Noted hx of chronic anemia with serial Feraheme infusions. Hg low but stable, plt wnl. No active bleed issues documented.  Goal of Therapy:  INR 2-3 Monitor platelets by anticoagulation protocol: Yes   Plan:  - Coumadin 4mg  po x 1 - Daily INR - Monitor CBC, for s/sx bleeding, DDI with amiodarone  Elicia Lamp, PharmD, BCPS Clinical  Pharmacist Clinical phone for 10/08/2017 until 3:30pm: [phone] If after 3:30pm, please call main pharmacy at: [phone] 10/08/2017 11:56 AM

## 2017-10-08 NOTE — Progress Notes (Addendum)
Medicine attending: I examined this patient today together with resident physician Dr. Alphonzo Grieve and I concur with her evaluation and management plan which we discussed together. Clinical history detailed in progress note by Dr. Jari Favre and briefly summarized in my progress note yesterday. He is slowly improving.  Temporary pacemaker still in place.  No sinus pauses noted since admission.  Intermittent SVT.  Currently on amiodarone.  No symptoms at rest.  Still requiring intermittent hemodialysis to manage volume in view of stage IV renal insufficiency in the face of moderate cardiomyopathy but nephrology today feels that he has made sufficient progress for trial off dialysis with continued close monitoring and reinitiation of diuretics.  Urine output 325 mL's over the last 24 hours. Addendum: Physical exam today shows right pupil 2 mm greater than left.  Both reactive.

## 2017-10-08 NOTE — Progress Notes (Signed)
Subjective: Interval History: has no complaint .  Objective: Vital signs in last 24 hours: Temp:  [97.7 F (36.5 C)-98.6 F (37 C)] 98.3 F (36.8 C) (03/03 0350) Pulse Rate:  [59-130] 67 (03/03 0915) Resp:  [19-25] 19 (03/03 0915) BP: (139-171)/(45-81) 148/58 (03/03 0350) SpO2:  [92 %-96 %] 95 % (03/03 0915) Weight:  [78.9 kg (173 lb 15.1 oz)] 78.9 kg (173 lb 15.1 oz) (03/03 0350) Weight change: -0.9 kg (-15.7 oz)  Intake/Output from previous day: 03/02 0701 - 03/03 0700 In: 120 [P.O.:120] Out: 326 [Urine:325; Stool:1] Intake/Output this shift: No intake/output data recorded.  General appearance: alert, cooperative, no distress, moderately obese and pale Resp: diminished breath sounds bilaterally, rales bibasilar and rhonchi bibasilar Chest wall: IJ cath Cardio: regular rate and rhythm and systolic murmur: holosystolic 2/6, blowing at apex GI: obese, pos bs, soft Extremities: edema 2-3+  Lab Results: Recent Labs    10/06/17 0350 10/07/17 0407  WBC 6.4 6.0  HGB 8.7* 8.6*  HCT 28.3* 28.4*  PLT 156 139*   BMET:  Recent Labs    10/06/17 0350 10/07/17 0407  NA 137 138  K 3.9 4.4  CL 99* 100*  CO2 27 28  GLUCOSE 84 87  BUN 16 7  CREATININE 3.16* 2.03*  CALCIUM 8.7* 8.5*   No results for input(s): PTH in the last 72 hours. Iron Studies: No results for input(s): IRON, TIBC, TRANSFERRIN, FERRITIN in the last 72 hours.  Studies/Results: Dg Swallowing Func-speech Pathology  Result Date: 10/06/2017 Objective Swallowing Evaluation: Type of Study: MBS-Modified Barium Swallow Study  Patient Details Name: [name] MRN: [id number] Date of Birth: [birth year] Today's Date: 10/06/2017 Time: SLP Start Time (ACUTE ONLY): 1037 -SLP Stop Time (ACUTE ONLY): 1058 SLP Time Calculation (min) (ACUTE ONLY): 21 min Past Medical History: Past Medical History: Diagnosis Date . Abdominal hernia   Chronic, not a good surgical candidate . Abscess, abdomen 12/31/2010  Referred to Wound Care  in 01/2011 because of multiple abd abscess with VERY large ventral hernia (please look at image of CT abd/pelvis 09/2010).  Because of hernia I was hesitant to I&D.     Marland Kitchen Anemia   History of Iron Def Anemia . Anxiety  . AVM (arteriovenous malformation)   chronic GI blood loss . Carotid artery disease ([place])   s/p CEA . Chronic diastolic CHF (congestive heart failure) (HCC)   takes Lasix . Chronic low back pain  . COPD (chronic obstructive pulmonary disease) ([place])  . CVA (cerebral infarction) 09/2010  Bilateral with Left > Right . Diabetes mellitus  . GERD (gastroesophageal reflux disease)  . History of nuclear stress test   Myoview 9/16:  Inferior, apical and inf-lateral ischemia; not gated; High Risk . Hx of echocardiogram   Echo 5/16:  Mild LVH, EF 55%, indeterm. diast function, WMA could not be ruled out, MAC, trivial MR, mild LAE, normal RVF //  b. Echo 4/17: EF 55-60%, no RWMA, Gr 2 DD, Ao sclerosis, MAC, mild MS, mild LAE, PASP 55 mHg . Hyperlipidemia  . Hypertension  . Intellectual disability   Sister helps to take care of him and takes him to appts . Itching   all over body; pt scratches and has sores on bilateral arms and abdomen . Lung nodule  . Myocardial infarction [facility]) 2016 ?  Heart attack  (  Per  pt. ) . Obesity  . Oxygen dependent   wears 2 liters at bedtime and when needed . PAF (paroxysmal atrial fibrillation) ([place]) 06/2009  CHADS score 2 (HTN, DM) // Pradaxa for Afib // Pradaxa stopped due to worsening anemia (Hgb in 5/18 8.5 >> Pradaxa remains on hold)  . Pneumonia   hx of . Stroke ([place])  . Tobacco user   Smokes 1ppd for multiple years.  Quit after hosp 09/2010. . Tubular adenoma of colon  . Wears glasses  Past Surgical History: Past Surgical History: Procedure Laterality Date . AMPUTATION Right 03/29/2013  Procedure: AMPUTATION RAY;  Surgeon: Newt Minion, MD;  Location: [place];  Service: Orthopedics;  Laterality: Right;  Right Foot 3rd and Possible 4th Ray Amputation . AMPUTATION Right 04/23/2013   Procedure: AMPUTATION RIGHT MID-FOOT;  Surgeon: Newt Minion, MD;  Location: [place];  Service: Orthopedics;  Laterality: Right; . arm surgery Left   as a child . CARDIAC CATHETERIZATION N/A 04/30/2015  Procedure: Left Heart Cath and Coronary Angiography;  Surgeon: Belva Crome, MD;  Location: [place] [facility];  Service: Cardiovascular;  Laterality: N/A; . Carotid arteriogram  10/2010  30% right ICA stenosis, 40% left ICA stenosis  . CAROTID ENDARTERECTOMY Left 08-13-15  CEA . CATARACT EXTRACTION, BILATERAL   . COLONOSCOPY WITH PROPOFOL N/A 06/10/2014  Procedure: COLONOSCOPY WITH PROPOFOL;  Surgeon: Jerene Bears, MD;  Location: [facility];  Service: Gastroenterology;  Laterality: N/A; . CORONARY ARTERY BYPASS GRAFT N/A 08/13/2015  Procedure: CORONARY ARTERY BYPASS GRAFTING (CABG), ON PUMP, TIMES THREE, USING LEFT INTERNAL MAMMARY ARTERY, RIGHT GREATER SAPHENOUS VEIN HARVESTED ENDOSCOPICALLY;  Surgeon: [name], MD;  Location: [place];  Service: Open Heart Surgery;  Laterality: N/A; . DEBRIDMENT OF DECUBITUS ULCER Right 02/13/2013 . ENDARTERECTOMY Left 08/13/2015  Procedure: ENDARTERECTOMY CAROTID;  Surgeon: Angelia Mould, MD;  Location: [place];  Service: Vascular;  Laterality: Left; . ESOPHAGOGASTRODUODENOSCOPY (EGD) WITH PROPOFOL N/A 06/10/2014  Procedure: ESOPHAGOGASTRODUODENOSCOPY (EGD) WITH PROPOFOL;  Surgeon: Jerene Bears, MD;  [facility];  Service: Gastroenterology;  Laterality: N/A; . GIVENS CAPSULE STUDY N/A 07/09/2014  Procedure: GIVENS CAPSULE STUDY;  Surgeon: Jerene Bears, MD;  Location: [facility];  Service: Gastroenterology;  Laterality: N/A; . I&D EXTREMITY Right 02/13/2013  Procedure: IRRIGATION AND DEBRIDEMENT FOOT ULCER;  Surgeon: Johnny Bridge, MD;  Location: [place];  Service: Orthopedics;  Laterality: Right;  PULSE LAVAGE . INSERTION OF DIALYSIS CATHETER N/A 10/02/2017  Procedure: INSERTION OF TUNNELED DIALYSIS CATHETER;  Surgeon: Elam Dutch, MD;  Location: [facility] OR;  Service:  Vascular;  Laterality: N/A; . MULTIPLE TOOTH EXTRACTIONS   . TEE WITHOUT CARDIOVERSION N/A 08/13/2015  Procedure: TRANSESOPHAGEAL ECHOCARDIOGRAM (TEE);  Surgeon: [name], MD;  Location: [place];  Service: Open Heart Surgery;  Laterality: N/A; . TEMPORARY PACEMAKER N/A 09/15/2017  Procedure: TEMPORARY PACEMAKER;  Surgeon: Jettie Booze, MD;  Location: [place] [facility];  Service: Cardiovascular;  Laterality: N/A; . TRANSESOPHAGEAL ECHOCARDIOGRAM  09/2010  No ASD or PFO. EF 60-65%.  Normal systolic function. No evidence of thrombus.  . TRANSTHORACIC ECHOCARDIOGRAM  09/2010   The cavity size was normal. Systolic function was vigorous.  EF 65-70%.  Normal wall funciton.  Marland Kitchen ULTRASOUND GUIDANCE FOR VASCULAR ACCESS  09/15/2017  Procedure: Ultrasound Guidance For Vascular Access;  Surgeon: Jettie Booze, MD;  Location: [place] [facility];  Service: Cardiovascular;; HPI: 62 yo M with History of paroxysmal A. Fib, DM, CHF, CAD, CABG 2017, carotid stenosis (S/P L Carotid Endarterectomy), GERD, CVA, COPD, HTN, Abdominal Hernia, and Diabetes presented  generalized weakness and was found to have flu and was in A.  Fib with RVR. Developed AKI requiring transient CRRT. Acute hypoxemic respiratory failure with bilateral pulmonary infiltrates. Intubated 2/12-2/21. CXR 2/22 increasing interstitial prominence, likely interstitial edema. Increasing bibasilar atelectasis. BSE 08/12/16 primary esophageal dysphagia. Esophagram same day no esophageal obstruction or stricture. Presbyesophagus. penetration of barium to the level of the vocal cords occurred during the exam and did elicit intermittent coughing. MBS 09/29/17 recommended D2, honey thick liquids. Repeat MBS recommended to assess diet/liquid upgrade.  No Data Recorded Assessment / Plan / Recommendation CHL IP CLINICAL IMPRESSIONS 10/06/2017 Clinical Impression Improved swallow function with fewer episodes of penetration and with thinner consistencies and intact strength  during this study. Intermittent mistiming of protective mechanism engagement led to silent penetration (transient and once on vocal cords) during challenging consecutive sips thin cleared with prompts to cough/throat clear. Verbal cues for smaller sips continue to pose aspiration risk given improved but at risk respiratory status. Slower but functional mastication with solid with potential for fatigue with regular texture therefore recommend upgrade to Dys 3 and nectar consistencies, intermittent throat clear, rest breaks if needed and pills whole in puree.  SLP Visit Diagnosis Dysphagia, pharyngeal phase (R13.13) Attention and concentration deficit following -- Frontal lobe and executive function deficit following -- Impact on safety and function Mild aspiration risk;Moderate aspiration risk   CHL IP TREATMENT RECOMMENDATION 10/06/2017 Treatment Recommendations Therapy as outlined in treatment plan below   Prognosis 10/06/2017 Prognosis for Safe Diet Advancement Good Barriers to Reach Goals -- Barriers/Prognosis Comment -- CHL IP DIET RECOMMENDATION 10/06/2017 SLP Diet Recommendations Dysphagia 3 (Mech soft) solids;Nectar thick liquid Liquid Administration via Cup;Straw Medication Administration Whole meds with puree Compensations Minimize environmental distractions;Slow rate;Small sips/bites;Clear throat intermittently Postural Changes Seated upright at 90 degrees   CHL IP OTHER RECOMMENDATIONS 10/06/2017 Recommended Consults -- Oral Care Recommendations Oral care BID Other Recommendations --   CHL IP FOLLOW UP RECOMMENDATIONS 10/06/2017 Follow up Recommendations Skilled Nursing facility   [facility] IP FREQUENCY AND DURATION 10/06/2017 Speech Therapy Frequency (ACUTE ONLY) min 2x/week Treatment Duration 2 weeks      CHL IP ORAL PHASE 10/06/2017 Oral Phase WFL Oral - Pudding Teaspoon -- Oral - Pudding Cup -- Oral - Honey Teaspoon -- Oral - Honey Cup WFL Oral - Nectar Teaspoon -- Oral - Nectar Cup WFL Oral - Nectar Straw -- Oral - Thin  Teaspoon -- Oral - Thin Cup WFL Oral - Thin Straw WFL Oral - Puree -- Oral - Mech Soft -- Oral - Regular WFL Oral - Multi-Consistency -- Oral - Pill -- Oral Phase - Comment --  CHL IP PHARYNGEAL PHASE 10/06/2017 Pharyngeal Phase Impaired Pharyngeal- Pudding Teaspoon -- Pharyngeal -- Pharyngeal- Pudding Cup -- Pharyngeal -- Pharyngeal- Honey Teaspoon -- Pharyngeal -- Pharyngeal- Honey Cup [facility] Pharyngeal Material does not enter airway Pharyngeal- Nectar Teaspoon -- Pharyngeal -- Pharyngeal- Nectar Cup [facility] Pharyngeal Material does not enter airway Pharyngeal- Nectar Straw -- Pharyngeal -- Pharyngeal- Thin Teaspoon -- Pharyngeal -- Pharyngeal- Thin Cup Penetration/Aspiration during swallow;Reduced airway/laryngeal closure Pharyngeal Material enters airway, remains ABOVE vocal cords then ejected out;Material enters airway, remains ABOVE vocal cords and not ejected out;Material enters airway, CONTACTS cords and not ejected out Pharyngeal- Thin Straw Penetration/Aspiration during swallow Pharyngeal Material enters airway, remains ABOVE vocal cords and not ejected out Pharyngeal- Puree -- Pharyngeal -- Pharyngeal- Mechanical Soft WFL Pharyngeal -- Pharyngeal- Regular WFL Pharyngeal -- Pharyngeal- Multi-consistency -- Pharyngeal -- Pharyngeal- Pill -- Pharyngeal -- Pharyngeal Comment --  CHL IP CERVICAL ESOPHAGEAL PHASE 10/06/2017 Cervical Esophageal Phase WFL Pudding Teaspoon --  Pudding Cup -- Honey Teaspoon -- Honey Cup -- Nectar Teaspoon -- Nectar Cup -- Nectar Straw -- Thin Teaspoon -- Thin Cup -- Thin Straw -- Puree -- Mechanical Soft -- Regular -- Multi-consistency -- Pill -- Cervical Esophageal Comment -- No flowsheet data found. Houston Siren 10/06/2017, 11:56 AM  Orbie Pyo Colvin Caroli.Ed CCC-SLP Pager [phone]              I have reviewed the patient's current medications.  Assessment/Plan: 1 AKI nonoliguric. Will follow Chem off HD, not back today. Will need diuresis soon. Baseline 1.5 -2 2 DM controlled 3  Tachybrady stable 4 Sepsis resolved 5 Anemia esa/Fe 6 Obesity 7 COPD signif  8 Debill P follow chem, urine vol, add diuretics soon.   LOS: 23 days   Jeneen Rinks Jovanny Stephanie 10/08/2017,9:21 AM

## 2017-10-08 NOTE — Progress Notes (Signed)
.     Subjective:  Patient endorses continued mildly productive cough; he states breathing is stable.   Objective:  Vital signs in last 24 hours: Vitals:   10/08/17 0010 10/08/17 0350 10/08/17 0915 10/08/17 0938  BP: (!) 144/60 (!) 148/58  (!) 156/95  Pulse: (!) 59 62 67 66  Resp: (!) 22 (!) 25 19 16   Temp: 98.6 F (37 C) 98.3 F (36.8 C)  98.2 F (36.8 C)  TempSrc: Oral Oral  Oral  SpO2: 95% 96% 95% 94%  Weight:  173 lb 15.1 oz (78.9 kg)    Height:       Constitutional: NAD, lying in bed comfortably CV: RRR, systolic murmur, no rubs or gallops Resp: Mild bibasilar crackles, no increased work of breathing Abd: soft, NDNT, +BS Ext: Pitting edema to upper thigh  Assessment Plan:  Principal Problem:   Acute respiratory failure with hypoxia (HCC) Active Problems:   DM (diabetes mellitus), type 2, uncontrolled (HCC)   Essential hypertension   Paroxysmal atrial fibrillation with rapid ventricular response (HCC)   COPD (chronic obstructive pulmonary disease) (HCC)   Chronic combined systolic and diastolic CHF (congestive heart failure) (HCC)   Ischemic cardiomyopathy   Right bundle branch block (RBBB) on electrocardiography   Tachy-brady syndrome (HCC)   Sinus pause   Influenza with respiratory manifestation   Aspiration pneumonia of both lower lobes (HCC)   AKI (acute kidney injury) (HCC)   Pressure injury of skin   Anemia of chronic disease  PAF with RVR, Tachy-Brady Syndrome:  One self limited episode of RVR yesterday. Cardiology to follow up as an outpatient for pacemaker placement.  -warfarin therapeutic -Avoid metoprolol to prevent any bradycardia -Amiodarone 200mg  BID  Acute on Chronic Renal Failure: On intermittent HD via tunneled dialysis cather placed 2/25. Overall post-HD Cr trending down hopeful for renal recovery in the future. Possibly will be able to start diuretics soon. -Nephrology is following  Acute resp failure in setting of PNA and fluid  overload: Was intubated in ICU earlier this month; still requiring some oxygen via [place]. Stable at 1L [place] at rest; he is very debilitated and does not ambulate much.  --goal O2 sat >90, wean as tolerated  Chronic anemia: Patient's Hgb stable around 8.0 for multiple days. -Getting Aranesp with nephrology -Ferrous Sulfate 325mg  Daily  DM: Current A1c on 09/28/17 was 7.9.  -Continue Lantus 14 units at bedtime with SSI.  FEN/GI: -Dysphagia 3, nectar thick fluid per SLP  -No IVF, replace electrolytes as needed  VTE Prophylaxis: Heparin TID Code Status: Full  Dispo: Anticipated discharge pending clinical improvement.   Alphonzo Grieve, MD 10/08/2017, 10:27 AM Pager: [phone]

## 2017-10-09 DIAGNOSIS — R34 Anuria and oliguria: Secondary | ICD-10-CM

## 2017-10-09 LAB — RENAL FUNCTION PANEL
ANION GAP: 9 (ref 5–15)
Albumin: 2.5 g/dL — ABNORMAL LOW (ref 3.5–5.0)
BUN: 25 mg/dL — ABNORMAL HIGH (ref 6–20)
CALCIUM: 8.6 mg/dL — AB (ref 8.9–10.3)
CO2: 27 mmol/L (ref 22–32)
CREATININE: 4.07 mg/dL — AB (ref 0.61–1.24)
Chloride: 100 mmol/L — ABNORMAL LOW (ref 101–111)
GFR, EST AFRICAN AMERICAN: 17 mL/min — AB (ref >60)
GFR, EST NON AFRICAN AMERICAN: 14 mL/min — AB (ref >60)
Glucose, Bld: 72 mg/dL (ref 65–99)
PHOSPHORUS: 4 mg/dL (ref 2.5–4.6)
Potassium: 5.2 mmol/L — ABNORMAL HIGH (ref 3.5–5.1)
SODIUM: 136 mmol/L (ref 135–145)

## 2017-10-09 LAB — GLUCOSE, CAPILLARY
GLUCOSE-CAPILLARY: 101 mg/dL — AB (ref 65–99)
GLUCOSE-CAPILLARY: 127 mg/dL — AB (ref 65–99)
GLUCOSE-CAPILLARY: 81 mg/dL (ref 65–99)
GLUCOSE-CAPILLARY: 87 mg/dL (ref 65–99)
GLUCOSE-CAPILLARY: 89 mg/dL (ref 65–99)
Glucose-Capillary: 76 mg/dL (ref 65–99)

## 2017-10-09 LAB — CBC
HCT: 25.2 % — ABNORMAL LOW (ref 39.0–52.0)
HEMOGLOBIN: 8 g/dL — AB (ref 13.0–17.0)
MCH: 29.1 pg (ref 26.0–34.0)
MCHC: 31.7 g/dL (ref 30.0–36.0)
MCV: 91.6 fL (ref 78.0–100.0)
PLATELETS: 128 10*3/uL — AB (ref 150–400)
RBC: 2.75 MIL/uL — AB (ref 4.22–5.81)
RDW: 20.6 % — ABNORMAL HIGH (ref 11.5–15.5)
WBC: 6 10*3/uL (ref 4.0–10.5)

## 2017-10-09 LAB — PROTIME-INR
INR: 2.36
Prothrombin Time: 25.6 seconds — ABNORMAL HIGH (ref 11.4–15.2)

## 2017-10-09 MED ORDER — WARFARIN SODIUM 4 MG PO TABS: 4.0000 mg | ORAL_TABLET | Freq: Once | ORAL | Status: DC

## 2017-10-09 MED ORDER — SODIUM CHLORIDE 0.9 % IV SOLN
510.0000 mg | INTRAVENOUS | Status: DC
  Filled 2017-10-09 (×2): qty 17

## 2017-10-09 NOTE — Progress Notes (Signed)
Patient off the unit for dialysis.

## 2017-10-09 NOTE — Progress Notes (Signed)
ANTICOAGULATION CONSULT NOTE - Follow Up Consult  Pharmacy Consult for Coumadin Indication: atrial fibrillation  Allergies  Allergen Reactions  . Penicillins Hives, Nausea And Vomiting, Swelling and Other (See Comments)    Tolerated Cefepime Has patient had a PCN reaction causing immediate rash, facial/tongue/throat swelling, SOB or lightheadedness with hypotension: YES Has patient had a PCN reaction causing severe rash involving mucus membranes or skin necrosis: No Has patient had a PCN reaction that required hospitalization No Has patient had a PCN reaction occurring within the last 10 years: No If all of the above answers are "NO", then may proceed with Cephalosporin use.     Patient Measurements: Height: 5' (152.4 cm) Weight: 175 lb 14.8 oz (79.8 kg) IBW/kg (Calculated) : 50 Heparin Dosing Weight: 68.7 kg   Vital Signs: Temp: 98.5 F (36.9 C) (03/04 0804) Temp Source: Oral (03/04 0804) BP: 182/70 (03/04 0804) Pulse Rate: 64 (03/04 0804)  Labs: Recent Labs    10/07/17 0407 10/08/17 0904 10/09/17 0725  HGB 8.6* 8.8*  --   HCT 28.4* 29.0*  --   PLT 139* 151  --   LABPROT 27.4* 26.6* 25.6*  INR 2.57 2.47 2.36  HEPARINUNFRC 0.53  --   --   CREATININE 2.03* 3.32*  --     Estimated Creatinine Clearance: 19.9 mL/min (A) (by C-G formula based on SCr of 3.32 mg/dL (H)).  Assessment: 21 yom on Xarelto 15mg  PTA for Afib (on med rec), not addressed initially in H&P by teaching service and Cards note says pt is NOT on anticoag 2/2 GIB in past.   Patient remains therapeutic on warfarin for Afib. Heparin bridge d/c'd on 3/2 due to therapeutic INR. Hg low but stable, plt wnl - last checked on 3/3. No signs/symptoms of bleeding noted.   Goal of Therapy:  INR 2-3 Monitor platelets by anticoagulation protocol: Yes   Plan:  - Warfarin 4mg  po x 1 - Daily INR - Monitor CBC, for s/sx bleeding, DDI with amiodarone  Doylene Canard, PharmD Clinical Pharmacist  Pager:  [phone] Clinical Phone for 10/09/2017 until 3:30pm: [phone] If after 3:30pm, please call main pharmacy at [phone] 10/09/2017 8:58 AM

## 2017-10-09 NOTE — Progress Notes (Signed)
Subjective:  Patient was seen laying comfortably in bed this AM eating breakfast in no acute distress. Patient states that his breathing has been the same for the past 24 hours and he notes that his cough has not improved. Patient reports some urine output overnight. No dysuria.   Objective:  Vital signs in last 24 hours: Vitals:   10/08/17 2009 10/08/17 2050 10/09/17 0008 10/09/17 0430  BP: (!) 149/59  (!) 144/58 (!) 159/62  Pulse: 65  60 (!) 58  Resp: (!) 22  15 20   Temp: 98.6 F (37 C)  98.4 F (36.9 C) 98.6 F (37 C)  TempSrc: Oral  Oral Oral  SpO2: 98% 97% 97% 97%  Weight:    175 lb 14.8 oz (79.8 kg)  Height:       Physical Exam  Constitutional:  Chronically sick appearing elderly gentleman laying comfortably in bed in no acute distress.  HENT:  Mouth/Throat: Oropharynx is clear and moist. No oropharyngeal exudate.  Cardiovascular: Normal rate, regular rhythm and intact distal pulses. Exam reveals no friction rub.  No murmur heard. Respiratory:  No signs of cyanosis. No accessory muscle use or nasal flaring. Decreased breath sounds at lung bases bilaterally. Lungs clear to auscultation in apical lung fields with intermittent transmitted upper airway rhonchi noted.  GI: Soft. He exhibits no distension. There is no tenderness. There is no rebound.  Musculoskeletal: He exhibits edema (1-2+ sacral edema). He exhibits no tenderness (with palpation of lower extremities).  Skin: Skin is warm and dry. No rash noted. No erythema.   Assessment Plan:  Principal Problem:   Acute respiratory failure with hypoxia (HCC) Active Problems:   DM (diabetes mellitus), type 2, uncontrolled ([place])   Essential hypertension   Paroxysmal atrial fibrillation with rapid ventricular response (HCC)   COPD (chronic obstructive pulmonary disease) (HCC)   Chronic combined systolic and diastolic CHF (congestive heart failure) (HCC)   Ischemic cardiomyopathy   Right bundle branch block (RBBB) on  electrocardiography   Tachy-brady syndrome (HCC)   Sinus pause   Influenza with respiratory manifestation   Aspiration pneumonia of both lower lobes (HCC)   AKI (acute kidney injury) ([place])   Pressure injury of skin   Anemia of chronic disease  Acute on Chronic Renal Failure: Patient is no longer oliguric, 325 mL, 275 mL urine output in the last 48 hours mL. On intermittent HD via tunneled dialysis cather placed 2/25 for volume overload. Patient's K+ elevated to 5.2 today from 4.8 yesterday, continued rise in Cr today. Patient still has signs of volume overload on exam, including 1-2+ pitting edema and decreased breath sounds at both lung bases (atelectasis vs effusions?). Think patient may benefit from additional session of dialysis today given changes in electrolytes, but will await for nephrology recommendations regarding dialysis vs lasix. -Nephrology following, recommendations appreciated  Acute Respiratory Failure 2/2 recent pneumonia, influenza, and volume overload: Respiratory status continues to improve, as patient stably saturating >90% on 1L [place] overnight. Decreased breath sounds at lung bases today may represent atelectasis vs pleural effusion. Awaiting nephrology recommendations for lasix vs dialysis for volume overload.  -Continue monitoring respiratory status -Incentive spirometry and flutter valve and out of bed to chair when able -Continue to wean as tolerated  PAF with RVR, Tachy-Brady Syndrome: Patient currently in NSR. Cardiology to follow up as an outpatient for pacemaker placement. -Anticoagulation with Warfarin, last INR 2.36 -Avoid metoprolol to prevent any bradycardia -Amiodarone 200mg  BID  Chronic anemia: Patient's Hgb stable around 8.0  during hospitalization. -Getting Aranesp with nephrology -Ferrous Sulfate 325mg  Daily  DM: Current A1c on 09/28/17 was 7.9.  -Continue Lantus 15 units at bedtime with TID SSI at mealtime.  FEN/GI: -Dysphagia 2 diet with honey thick  liquid per SLP  -No IVF, replace electrolytes as needed  VTE Prophylaxis: Heparin TID Code Status: Full  Dispo: Anticipated discharge pending clinical improvement.   Thomasene Ripple, MD 10/09/2017, 6:40 AM Pager: [phone]

## 2017-10-09 NOTE — Progress Notes (Signed)
Inpatient Diabetes Program Recommendations  AACE/ADA: New Consensus Statement on Inpatient Glycemic Control (2015)  Target Ranges:  Prepandial:   less than 140 mg/dL      Peak postprandial:   less than 180 mg/dL (1-2 hours)      Critically ill patients:  140 - 180 mg/dL   Lab Results  Component Value Date   GLUCAP 89 10/09/2017   HGBA1C 7.9 (H) 09/28/2017    Review of Glycemic Control Results for [name], [name] (MRN [id number]) as of 10/09/2017 11:46  Ref. Range 10/09/2017 00:10 10/09/2017 04:33  Glucose-Capillary Latest Ref Range: 65 - 99 mg/dL 101 (H) 81  Results for [name], [name] (MRN [id number]) as of 10/09/2017 11:46  Ref. Range 10/09/2017 08:03 10/09/2017 11:41  Glucose-Capillary Latest Ref Range: 65 - 99 mg/dL 87 89   Diabetes history: DM 2 Outpatient Diabetes medications:  Lantus 10 units q PM Current orders for Inpatient glycemic control:  Lantus 14 units q HS, Novolog resistant tid with meals and HS Inpatient Diabetes Program Recommendations:   Blood sugars less than 100 mg/dL. Please reduce Lantus to 10 units q HS.  Also consider reducing Novolog correction to sensitive tid with meals and HS.    Thanks,  Adah Perl, RN, BC-ADM Inpatient Diabetes Coordinator Pager [phone] (8a-5p)

## 2017-10-09 NOTE — Procedures (Signed)
I have personally attended this patient's dialysis session.  Goal reduced 2/2 soft BP So far about net net neg 1200 2K2.25 bath Using R IJ TDC.    Jamal Maes, MD [facility] [phone] Pager 10/09/2017, 6:07 PM

## 2017-10-09 NOTE — Progress Notes (Addendum)
[facility] Rounding Note  Subjective/Interval History:  Last HD was 3/1 Creatinine rising steadily past 2 days Some UOP not great Pleasant but incredibly deconditioned Says SOB but no more than yesterday  Objective Vital signs in last 24 hours: Vitals:   10/09/17 0008 10/09/17 0430 10/09/17 0804 10/09/17 0849  BP: (!) 144/58 (!) 159/62 (!) 182/70   Pulse: 60 (!) 58 64   Resp: 15 20 18    Temp: 98.4 F (36.9 C) 98.6 F (37 C) 98.5 F (36.9 C)   TempSrc: Oral Oral Oral   SpO2: 97% 97% 95% 95%  Weight:  79.8 kg (175 lb 14.8 oz)    Height:       Weight change: 0.9 kg (1 lb 15.7 oz)  Intake/Output Summary (Last 24 hours) at 10/09/2017 1109 Last data filed at 10/09/2017 0830 Gross per 24 hour  Intake 120 ml  Output 275 ml  Net -155 ml   Physical Exam:  Blood pressure (!) 182/70, pulse 64, temperature 98.5 F (36.9 C), temperature source Oral, resp. rate 18, height 5' (1.524 m), weight 79.8 kg (175 lb 14.8 oz), SpO2 95 %. Pale, deconditioned, very pleasant Wearing O2 Ant coarse breath sounds Crackles at bases Chest sternotomy scar R TDC catheter S1S2 NoS3 Abd soft not tender Some pitting edema post thighs, shiny skin below knees 1+ edema there   Recent Labs  Lab 10/03/17 0204 10/04/17 1117 10/05/17 0323 10/06/17 0350 10/07/17 0407 10/08/17 0904 10/09/17 0725  NA 137 137 136 137 138 136 136  K 3.7 3.8 3.9 3.9 4.4 4.8 5.2*  CL 100* 99* 99* 99* 100* 101 100*  CO2 27 25 27 27 28 23 27   GLUCOSE 105* 106* 90 84 87 83 72  BUN 22* 32* 11 16 7 18  25*  CREATININE 2.54* 3.73* 2.17* 3.16* 2.03* 3.32* 4.07*  CALCIUM 8.6* 8.8* 8.4* 8.7* 8.5* 8.7* 8.6*  PHOS 3.2 4.2 2.5 3.2 2.3* 3.1 4.0    Recent Labs  Lab 10/07/17 0407 10/08/17 0904 10/09/17 0725  ALBUMIN 2.6* 2.6* 2.5*   Recent Labs  Lab 10/05/17 0323 10/06/17 0350 10/07/17 0407 10/08/17 0904  WBC 5.2 6.4 6.0 6.5  HGB 8.3* 8.7* 8.6* 8.8*  HCT 26.7* 28.3* 28.4* 29.0*  MCV 88.1 90.1 91.0 93.5  PLT 137* 156 139* 151     Recent Labs  Lab 10/08/17 2011 10/09/17 0010 10/09/17 0433 10/09/17 0801 10/09/17 0803  GLUCAP 120* 101* 81 508* 87    Iron/TIBC/Ferritin/ %Sat    Component Value Date/Time   IRON 17 (L) 09/30/2017 1435   TIBC 133 (L) 09/30/2017 1435   FERRITIN 87 09/30/2017 1435   IRONPCTSAT 13 (L) 09/30/2017 1435   IRONPCTSAT 8 (L) 12/13/2012 1554   Medications: . sodium chloride Stopped (10/03/17 1900)  . sodium chloride    . sodium chloride     . amiodarone  200 mg Oral BID  . atorvastatin  80 mg Oral Daily  . chlorhexidine  15 mL Mouth Rinse BID  . coumadin book   Does not apply Once  . darbepoetin (ARANESP) injection - DIALYSIS  150 mcg Intravenous Q Wed-HD  . docusate sodium  100 mg Oral BID  . hydrocerin   Topical BID  . insulin aspart  0-20 Units Subcutaneous TID WC  . insulin glargine  14 Units Subcutaneous QHS  . ipratropium-albuterol  3 mL Nebulization BID  . mouth rinse  15 mL Mouth Rinse BID  . mouth rinse  15 mL Mouth Rinse q12n4p  . multivitamin  1 tablet Oral QHS  . pantoprazole  40 mg Oral Daily  . PARoxetine  40 mg Oral Daily  . senna  1 tablet Oral BID  . senna-docusate  1 tablet Oral QHS  . sodium chloride flush  3 mL Intravenous Q12H  . warfarin  4 mg Oral ONCE-1800  . warfarin   Does not apply Once  . Warfarin - Pharmacist Dosing Inpatient   Does not apply q1800    Background: 62 yo male, admitted 2/9 w/AF RVR, temp pacer required 2/2 long pauses w/hypotension, Flu A +, hypoxic resp failure (required vent support). AKI in this setting (ATN rel shock/sepsis vs vanco), CRRT 2/14 - 2/18. 1st HD 09/27/17 and requiring intermittently. Baseline creatinine PTA appears to have been around 1.6-1.7 past year.   Assessment/Recommendations  1. AKI on CKD3 - shock/+/-vanco/sepsis related ATN. CRRT 2/14 - 2/18; HD required intermittently since 09/27/17. Making some but not great urine. Creatinine rising in interdialytic interval. K creeping up. Volume still up. I think  need HD today for volume/K.  2. PAF tachy/brady syndrome - amio/coumadin 3. S/p flu/PNA - post ATB's/tamiflu 4. Systolic HF - EF 09-62%. F/U CXR AM (for HD today for volume removal) 5. Volume overload - HD today. If renal fx ever reaches plateau indicating GFR return then diuretics but for today HD 6. Anemia on Aranesp 150 "QWed w/HD"  Last dosed 10/04/17. Due 3/6. Fe studies 2/23 sig fe def. I don't see that any IV Fe was ever given,would benefit from [facility] for tsat that low. Ordered 7. DM per primary team 8. Dysphagia - D3 diet  Jamal Maes, MD [facility] [phone] pager 10/09/2017, 11:09 AM

## 2017-10-09 NOTE — Progress Notes (Signed)
Clinical Social Worker following patient for support and discharge needs.Patient has been assess for SNF and is agreeable to discharge to [facility] once medically stable   [name], MSW,  [place]

## 2017-10-09 NOTE — Progress Notes (Signed)
Physical Therapy Treatment Patient Details Name: [name] MRN: [id number] DOB: [birth year] Today's Date: 10/09/2017    History of Present Illness Pt is a 62 y.o. male admitted 09/15/17 with generalized weakness, cough, and dizziness; found to be back in a-fib with rapid rate. S/p temporary pacemaker placement 2/8; electrophysiology avoiding PPM implant due to pt's co-morbidities. Intubated 2/12-2/21. Also with acute on chronic renal failure; s/p R IJ tunnelled dialysis cath placement 10/02/17. PMH includes CHF, CAD (s/p CABG 2017), carotid stenosis (s/p L carotid endarterectomy), CVA, COPD, HTN, DM, intellectual disability.     PT Comments    Pt progressing towards goals. Session focused on LE HEP for strengthening. Pt able to follow commands with increased time to participate in HEP. Current recommendations appropriate. Will continue to follow acutely to maximize functional mobility independence and safety.    Follow Up Recommendations  SNF;Supervision/Assistance - 24 hour     Equipment Recommendations  None recommended by PT    Recommendations for Other Services       Precautions / Restrictions Precautions Precautions: Fall Precaution Comments: s/p RIJ tunnelled cath 2/25 Restrictions Weight Bearing Restrictions: No    Mobility  Bed Mobility               General bed mobility comments: Deferred as no +2 assist available and per last note required +2 max/total assist for mobility.   Transfers                    Ambulation/Gait                 Stairs            Wheelchair Mobility    Modified Rankin (Stroke Patients Only)       Balance                                            Cognition Arousal/Alertness: Awake/alert Behavior During Therapy: Flat affect Overall Cognitive Status: History of cognitive impairments - at baseline Area of Impairment: Attention;Memory;Following  commands;Safety/judgement;Awareness;Problem solving                   Current Attention Level: Sustained Memory: Decreased short-term memory Following Commands: Follows one step commands consistently Safety/Judgement: Decreased awareness of deficits;Decreased awareness of safety Awareness: Emergent Problem Solving: Slow processing;Decreased initiation;Requires verbal cues;Requires tactile cues General Comments: Pt able to follow commands to assist with HEP this session.      Exercises General Exercises - Lower Extremity Ankle Circles/Pumps: AROM;Both;20 reps;Supine Quad Sets: AROM;Both;10 reps;Supine(with cues for technique ) Gluteal Sets: AROM;Both;10 reps;Supine Heel Slides: AROM;Right;AAROM;Left;10 reps;Supine Hip ABduction/ADduction: AROM;Right;AAROM;Left;10 reps;Supine    General Comments        Pertinent Vitals/Pain Pain Assessment: No/denies pain    Home Living                      Prior Function            PT Goals (current goals can now be found in the care plan section) Acute Rehab PT Goals Patient Stated Goal: Get stronger PT Goal Formulation: With patient Time For Goal Achievement: 10/17/17 Potential to Achieve Goals: Good Progress towards PT goals: Progressing toward goals    Frequency    Min 2X/week      PT Plan Current plan remains appropriate    Co-evaluation  AM-PAC PT "6 Clicks" Daily Activity  Outcome Measure  Difficulty turning over in bed (including adjusting bedclothes, sheets and blankets)?: Unable Difficulty moving from lying on back to sitting on the side of the bed? : Unable Difficulty sitting down on and standing up from a chair with arms (e.g., wheelchair, bedside commode, etc,.)?: Unable Help needed moving to and from a bed to chair (including a wheelchair)?: Total Help needed walking in hospital room?: Total Help needed climbing 3-5 steps with a railing? : Total 6 Click Score: 6    End of  Session Equipment Utilized During Treatment: Gait belt;Oxygen Activity Tolerance: Patient tolerated treatment well Patient left: in bed;with call bell/phone within reach Nurse Communication: Mobility status PT Visit Diagnosis: Other abnormalities of gait and mobility (R26.89);Muscle weakness (generalized) (M62.81);Difficulty in walking, not elsewhere classified (R26.2)     Time: 2035-5974 PT Time Calculation (min) (ACUTE ONLY): 15 min  Charges:  $Therapeutic Exercise: 8-22 mins                    G Codes:       Leighton Ruff, PT, DPT  Acute Rehabilitation Services  Pager: [phone]    Rudean Hitt 10/09/2017, 12:41 PM

## 2017-10-10 ENCOUNTER — Inpatient Hospital Stay (HOSPITAL_COMMUNITY): Payer: Medicare (Managed Care)

## 2017-10-10 LAB — RENAL FUNCTION PANEL
ANION GAP: 10 (ref 5–15)
ANION GAP: 8 (ref 5–15)
Albumin: 2.5 g/dL — ABNORMAL LOW (ref 3.5–5.0)
Albumin: 2.5 g/dL — ABNORMAL LOW (ref 3.5–5.0)
BUN: 10 mg/dL (ref 6–20)
BUN: 12 mg/dL (ref 6–20)
CALCIUM: 8.4 mg/dL — AB (ref 8.9–10.3)
CO2: 28 mmol/L (ref 22–32)
CO2: 29 mmol/L (ref 22–32)
CREATININE: 2.42 mg/dL — AB (ref 0.61–1.24)
Calcium: 8.2 mg/dL — ABNORMAL LOW (ref 8.9–10.3)
Chloride: 98 mmol/L — ABNORMAL LOW (ref 101–111)
Chloride: 99 mmol/L — ABNORMAL LOW (ref 101–111)
Creatinine, Ser: 2.21 mg/dL — ABNORMAL HIGH (ref 0.61–1.24)
GFR calc Af Amer: 35 mL/min — ABNORMAL LOW (ref >60)
GFR, EST AFRICAN AMERICAN: 31 mL/min — AB (ref >60)
GFR, EST NON AFRICAN AMERICAN: 30 mL/min — AB (ref >60)
GFR, EST NON AFRICAN AMERICAN: 27 mL/min — AB (ref >60)
GLUCOSE: 111 mg/dL — AB (ref 65–99)
Glucose, Bld: 87 mg/dL (ref 65–99)
PHOSPHORUS: 3.2 mg/dL (ref 2.5–4.6)
PHOSPHORUS: 3.6 mg/dL (ref 2.5–4.6)
POTASSIUM: 3.8 mmol/L (ref 3.5–5.1)
Potassium: 4.1 mmol/L (ref 3.5–5.1)
SODIUM: 136 mmol/L (ref 135–145)
Sodium: 136 mmol/L (ref 135–145)

## 2017-10-10 LAB — CBC
HCT: 29 % — ABNORMAL LOW (ref 39.0–52.0)
Hemoglobin: 8.6 g/dL — ABNORMAL LOW (ref 13.0–17.0)
MCH: 27.5 pg (ref 26.0–34.0)
MCHC: 29.7 g/dL — ABNORMAL LOW (ref 30.0–36.0)
MCV: 92.7 fL (ref 78.0–100.0)
PLATELETS: 127 10*3/uL — AB (ref 150–400)
RBC: 3.13 MIL/uL — AB (ref 4.22–5.81)
RDW: 20.5 % — ABNORMAL HIGH (ref 11.5–15.5)
WBC: 5.1 10*3/uL (ref 4.0–10.5)

## 2017-10-10 LAB — GLUCOSE, CAPILLARY
GLUCOSE-CAPILLARY: 508 mg/dL — AB (ref 65–99)
GLUCOSE-CAPILLARY: 101 mg/dL — AB (ref 65–99)
GLUCOSE-CAPILLARY: 81 mg/dL (ref 65–99)
GLUCOSE-CAPILLARY: 90 mg/dL (ref 65–99)
Glucose-Capillary: 92 mg/dL (ref 65–99)
Glucose-Capillary: 137 mg/dL — ABNORMAL HIGH (ref 65–99)

## 2017-10-10 LAB — PROTIME-INR
INR: 2.38
PROTHROMBIN TIME: 25.8 s — AB (ref 11.4–15.2)

## 2017-10-10 MED ORDER — WARFARIN SODIUM 3 MG PO TABS
3.0000 mg | ORAL_TABLET | Freq: Once | ORAL | Status: AC
  Administered 2017-10-10: 3 mg via ORAL
  Filled 2017-10-10: qty 1

## 2017-10-10 MED ORDER — FUROSEMIDE 10 MG/ML IJ SOLN
160.0000 mg | Freq: Two times a day (BID) | INTRAVENOUS | Status: AC
  Administered 2017-10-10 – 2017-10-11 (×2): 160 mg via INTRAVENOUS
  Filled 2017-10-10 (×2): qty 16

## 2017-10-10 NOTE — Progress Notes (Signed)
[facility] Rounding Note  Subjective/Interval History:  HD3/1, then yesterday 3/4 for volume mostly Net UF around 1200 Says breathing is better Got weight down 78.4 to 76.7   Objective Vital signs in last 24 hours: Vitals:   10/10/17 0500 10/10/17 0816 10/10/17 0847 10/10/17 1140  BP:  (!) 175/71  (!) 146/56  Pulse:  88  64  Resp:      Temp:      TempSrc:      SpO2:   97%   Weight: 76.9 kg (169 lb 8.5 oz)     Height:       Weight change: -1.4 kg (-1.4 oz)  Intake/Output Summary (Last 24 hours) at 10/10/2017 1244 Last data filed at 10/10/2017 0345 Gross per 24 hour  Intake -  Output 1824 ml  Net -1824 ml   Physical Exam:  Blood pressure (!) 146/56, pulse 64, temperature 97.6 F (36.4 C), temperature source Oral, resp. rate (!) 31, height 5' (1.524 m), weight 76.9 kg (169 lb 8.5 oz), SpO2 97 %. Pale, deconditioned, very pleasant Wearing O2 Ant coarse breath sounds Crackles at bases persist Chest sternotomy scar R TDC catheter S1S2 NoS3 Abd soft not tender LE pitting edema now just trace   Recent Labs  Lab 10/05/17 0323 10/06/17 0350 10/07/17 0407 10/08/17 0904 10/09/17 0725 10/10/17 0310 10/10/17 0726  NA 136 137 138 136 136 136 136  K 3.9 3.9 4.4 4.8 5.2* 3.8 4.1  CL 99* 99* 100* 101 100* 98* 99*  CO2 27 27 28 23 27 28 29   GLUCOSE 90 84 87 83 72 111* 87  BUN 11 16 7 18  25* 10 12  CREATININE 2.17* 3.16* 2.03* 3.32* 4.07* 2.21* 2.42*  CALCIUM 8.4* 8.7* 8.5* 8.7* 8.6* 8.2* 8.4*  PHOS 2.5 3.2 2.3* 3.1 4.0 3.2 3.6    Recent Labs  Lab 10/09/17 0725 10/10/17 0310 10/10/17 0726  ALBUMIN 2.5* 2.5* 2.5*   Recent Labs  Lab 10/07/17 0407 10/08/17 0904 10/09/17 1455 10/10/17 0310  WBC 6.0 6.5 6.0 5.1  HGB 8.6* 8.8* 8.0* 8.6*  HCT 28.4* 29.0* 25.2* 29.0*  MCV 91.0 93.5 91.6 92.7  PLT 139* 151 128* 127*    Recent Labs  Lab 10/09/17 2012 10/09/17 2357 10/10/17 0432 10/10/17 0737 10/10/17 1135  GLUCAP 76 127* 101* 81 92    Iron/TIBC/Ferritin/ %Sat     Component Value Date/Time   IRON 17 (L) 09/30/2017 1435   TIBC 133 (L) 09/30/2017 1435   FERRITIN 87 09/30/2017 1435   IRONPCTSAT 13 (L) 09/30/2017 1435   IRONPCTSAT 8 (L) 12/13/2012 1554   Medications:  . amiodarone  200 mg Oral BID  . atorvastatin  80 mg Oral Daily  . chlorhexidine  15 mL Mouth Rinse BID  . coumadin book   Does not apply Once  . darbepoetin (ARANESP) injection - DIALYSIS  150 mcg Intravenous Q Wed-HD  . docusate sodium  100 mg Oral BID  . hydrocerin   Topical BID  . insulin aspart  0-20 Units Subcutaneous TID WC  . insulin glargine  14 Units Subcutaneous QHS  . ipratropium-albuterol  3 mL Nebulization BID  . mouth rinse  15 mL Mouth Rinse BID  . mouth rinse  15 mL Mouth Rinse q12n4p  . multivitamin  1 tablet Oral QHS  . pantoprazole  40 mg Oral Daily  . PARoxetine  40 mg Oral Daily  . senna  1 tablet Oral BID  . senna-docusate  1 tablet Oral QHS  . sodium  chloride flush  3 mL Intravenous Q12H  . warfarin  3 mg Oral ONCE-1800  . warfarin   Does not apply Once  . Warfarin - Pharmacist Dosing Inpatient   Does not apply q1800   Dg Chest 2 View  Result Date: 10/10/2017 CLINICAL DATA:  History of pulmonary edema, end-stage renal disease, COPD EXAM: CHEST  2 VIEW COMPARISON:  Portable chest x-ray of 10/02/2016 and chest x-ray of 09/18/2017 FINDINGS: The lungs again are not optimally aerated and there are persistent changes of CHF with pulmonary vascular congestion, cardiomegaly, and small pleural effusions right greater than left. Right dialysis catheter tip overlies the region of the right atrium and cardiomegaly is stable. Median sternotomy sutures are unchanged. IMPRESSION: 1. Little change in CHF pattern with bilateral pleural effusions. 2. Suboptimal aeration. Electronically Signed   By: Ivar Drape M.D.   On: 10/10/2017 08:06   Background: 61 yo male, admitted 2/9 w/AF RVR, temp pacer required 2/2 long pauses w/hypotension, Flu A +, hypoxic resp failure (required  vent support). AKI in this setting (ATN rel shock/sepsis vs vanco), CRRT 2/14 - 2/18. 1st HD 09/27/17 and requiring intermittently. Baseline creatinine PTA appears to have been around 1.6-1.7 past year.   Assessment/Recommendations  1. AKI on CKD3 - shock/+/-vanco/sepsis related ATN. CRRT 2/14 - 2/18; HD required intermittently since 09/27/17. Making some but not great urine. HD 3/4 for volume mostly so today's creatinine not interpretable. Trend labs and UOP for recovery enough to do without HD 2. PAF tachy/brady syndrome - amio/coumadin 3. S/p flu/PNA - post ATB's/tamiflu 4. Systolic HF - EF 53-74%. Despite volume removal with HD CXR today still with vascular congestion and pleural effusions/exam matches the CXR. Will add some IV lasix as we watch his renal function and reassess HD needs 5. Anemia on Aranesp 150 "QWed w/HD"  Last dosed 10/04/17. Due 3/6. Fe studies 2/23 sig fe def. Had 2 doses Feraheme per pharmacy (2/23, 2/25) 6. DM per primary team 7. Dysphagia - D3 diet  Jamal Maes, MD [facility] [phone] pager 10/10/2017, 12:44 PM

## 2017-10-10 NOTE — Progress Notes (Signed)
   Subjective:  Patient seen sitting comfortably in bed this AM in no acute distress. Patient states he feels much improved after dialysis and thinks his breathing is returning back to normal.   Objective:  Vital signs in last 24 hours: Vitals:   10/10/17 0500 10/10/17 0816 10/10/17 0847 10/10/17 1140  BP:  (!) 175/71  (!) 146/56  Pulse:  88  64  Resp:      Temp:      TempSrc:      SpO2:   97%   Weight: 169 lb 8.5 oz (76.9 kg)     Height:       Physical Exam  Constitutional:  Appears older than stated age, chronically sick in no  HENT:  Mouth/Throat: Oropharynx is clear and moist. No oropharyngeal exudate.  Cardiovascular: Normal rate, regular rhythm and intact distal pulses. Exam reveals no friction rub.  No murmur heard. Respiratory: Effort normal. No respiratory distress. He has no wheezes. He has no rales.  GI: Soft. He exhibits no distension. There is no tenderness. There is no rebound.  Musculoskeletal: He exhibits edema (Trace-1+ pitting sacral edema). He exhibits no tenderness (Trace-1+ pitting sacral edema).  Skin: Skin is warm and dry. No rash noted. No erythema.   Assessment/Plan:  Principal Problem:   Acute respiratory failure with hypoxia (HCC) Active Problems:   DM (diabetes mellitus), type 2, uncontrolled (Index)   Essential hypertension   Paroxysmal atrial fibrillation with rapid ventricular response (HCC)   COPD (chronic obstructive pulmonary disease) (HCC)   Chronic combined systolic and diastolic CHF (congestive heart failure) (HCC)   Ischemic cardiomyopathy   Right bundle branch block (RBBB) on electrocardiography   Tachy-brady syndrome (HCC)   Sinus pause   Influenza with respiratory manifestation   Aspiration pneumonia of both lower lobes (HCC)   AKI (acute kidney injury) ([place])   Pressure injury of skin   Anemia of chronic disease  Acute on Chronic Renal Failure:On intermittent HD via tunneled dialysis cather placed 2/25 for volume overload,  latest session yesterday 10/09/2017. Sessions mainly for volume overload and not electrolyte disturbances. Patient states feeling well after dialysis and reports improvement in breathing. Urine output recorded as 550 mL total yesterday.  -Nephrology following, recommendations appreciated -Add lasix for 160mg  BID for 2 doses per nephrology recomendations  Acute Respiratory Failure 2/2 recent pneumonia, influenza, and volume overload:Respiratory status continues to improve, as patient stably saturating >90% on 1L during exam, currently on 2L. Will encourage nursing staff to wean to room air as tolerated and continue pulmonary hygiene. Patient did not have signs/symptoms of volume overload on exam today. -Continue monitoring respiratory status -Incentive spirometry and flutter valve and out of bed to chair when able -Continue to wean as tolerated  PAF with RVR, Tachy-Brady Syndrome: Patient currently in NSR. Cardiology to follow up as an outpatient for pacemaker placement. -Anticoagulation with Warfarin 3 mg daily, last INR 2.36 -Avoid metoprolol to prevent any bradycardia -Amiodarone 200mg  BID  DM: Current A1c on 09/28/17 was 7.9.  -Continue Lantus 14 units at bedtime with TID SSI at mealtime.  FEN/GI: -Dysphagia 2 diet with honey thick liquid per SLP  -No IVF, replace electrolytes as needed  VTE Prophylaxis: Heparin TID Code Status: Full  Dispo: Anticipated discharge pending kidney recovery vs permanent dialysis decision.  Thomasene Ripple, MD 10/10/2017, 12:58 PM Pager: [phone]

## 2017-10-10 NOTE — Progress Notes (Signed)
ANTICOAGULATION CONSULT NOTE - Follow Up Consult  Pharmacy Consult for Coumadin Indication: atrial fibrillation  Allergies  Allergen Reactions  . Penicillins Hives, Nausea And Vomiting, Swelling and Other (See Comments)    Tolerated Cefepime Has patient had a PCN reaction causing immediate rash, facial/tongue/throat swelling, SOB or lightheadedness with hypotension: YES Has patient had a PCN reaction causing severe rash involving mucus membranes or skin necrosis: No Has patient had a PCN reaction that required hospitalization No Has patient had a PCN reaction occurring within the last 10 years: No If all of the above answers are "NO", then may proceed with Cephalosporin use.     Patient Measurements: Height: 5' (152.4 cm) Weight: 169 lb 8.5 oz (76.9 kg) IBW/kg (Calculated) : 50 Heparin Dosing Weight: 68.7 kg   Vital Signs: Temp: 97.6 F (36.4 C) (03/05 0343) Temp Source: Oral (03/05 0343) BP: 175/71 (03/05 0816) Pulse Rate: 88 (03/05 0816)  Labs: Recent Labs    10/08/17 0904 10/09/17 0725 10/09/17 1455 10/10/17 0310 10/10/17 0726  HGB 8.8*  --  8.0* 8.6*  --   HCT 29.0*  --  25.2* 29.0*  --   PLT 151  --  128* 127*  --   LABPROT 26.6* 25.6*  --  25.8*  --   INR 2.47 2.36  --  2.38  --   CREATININE 3.32* 4.07*  --  2.21* 2.42*    Estimated Creatinine Clearance: 26.9 mL/min (A) (by C-G formula based on SCr of 2.42 mg/dL (H)).  Assessment: 32 yom on Xarelto 15mg  PTA for Afib (on med rec), not addressed initially in H&P by teaching service and Cards note says pt is NOT on anticoag 2/2 GIB in past.   Patient remains therapeutic on warfarin for Afib (INR is 2.38). Heparin bridge d/c'd on 3/2 due to therapeutic INR. Hg low but stable, plt wnl but trending down (127). Dose was missed last night due to being in HD. No signs/symptoms of bleeding noted.   Goal of Therapy:  INR 2-3 Monitor platelets by anticoagulation protocol: Yes   Plan:  - Warfarin 3 mg po x 1 - Daily  INR - Monitor CBC, for s/sx bleeding, DDI with amiodarone  Doylene Canard, PharmD Clinical Pharmacist  Pager: [phone] Clinical Phone for 10/10/2017 until 3:30pm: [phone] If after 3:30pm, please call main pharmacy at [phone] 10/10/2017 11:18 AM

## 2017-10-10 NOTE — Progress Notes (Signed)
  Speech Language Pathology Treatment: Dysphagia  Patient Details Name: [name] MRN: [id number] DOB: September 01, 1955 Today's Date: 10/10/2017 Time: 2458-0998 SLP Time Calculation (min) (ACUTE ONLY): 15 min  Assessment / Plan / Recommendation Clinical Impression  Skilled treatment session focused on dysphagia goals. SLP facilitated session by providing skilled observation of pt consuming thin water via cup with Min A verbal cues for small sips. Pt with appearance of timely swallow initiation, all vitals remained within functional limits and pt consumed 8 oz thin liquids with 1 delayed cough. Recommend pt be allow thin water in between meals, after oral care and only with nursing (RN, NT). Nurse present and education provided and all questions answered to satisfaction.    HPI HPI: 62 yo M with History of paroxysmal A. Fib, DM, CHF, CAD, CABG 2017, carotid stenosis (S/P L Carotid Endarterectomy), GERD, CVA, COPD, HTN, Abdominal Hernia, and Diabetes presented  generalized weakness and was found to have flu and was in A. Fib with RVR. Developed AKI requiring transient CRRT. Acute hypoxemic respiratory failure with bilateral pulmonary infiltrates. Intubated 2/12-2/21. CXR 2/22 increasing interstitial prominence, likely interstitial edema. Increasing bibasilar atelectasis. BSE 08/12/16 primary esophageal dysphagia. Esophagram same day no esophageal obstruction or stricture. Presbyesophagus. penetration of barium to the level of the vocal cords occurred during the exam and did elicit intermittent coughing. MBS 09/29/17 recommended D2, honey thick liquids. Repeat MBS recommended to assess diet/liquid upgrade.      SLP Plan  Continue with current plan of care(initiate thin water between meals)       Recommendations  Diet recommendations: Dysphagia 3 (mechanical soft);Nectar-thick liquid(water in between meals) Liquids provided via: Cup Medication Administration: Whole meds with puree Supervision:  Patient able to self feed;Intermittent supervision to cue for compensatory strategies Compensations: Minimize environmental distractions;Slow rate;Small sips/bites;Clear throat intermittently Postural Changes and/or Swallow Maneuvers: Seated upright 90 degrees                Oral Care Recommendations: Oral care BID Follow up Recommendations: Skilled Nursing facility SLP Visit Diagnosis: Dysphagia, pharyngeal phase (R13.13) Plan: Continue with current plan of care(initiate thin water between meals)       GO                Zamzam Whinery 10/10/2017, 5:31 PM

## 2017-10-10 NOTE — Consult Note (Signed)
[place] Nurse wound follow up Wound type: pressure injury, upper gluteal fold Measurement: two areas with skin bridge between. Distal 3cm x 1.0cm x 0.1cm; proximal 1cm x 0.5cm x 0.1cm  Wound bed: both areas clean and pink Drainage (amount, consistency, odor) minimal, serosanguinous Periwound: intact, some maceration  Dressing procedure/placement/frequency: Continue silicone foam, making sure to get foam deep into gluteal crease so that foam is touching base.   [place] Nurse team will follow along with you for weekly wound assessments.  Please notify me of any acute changes in the wounds or any new areas of concerns [place] MSN, RN,CWOCN, [place], [place]

## 2017-10-10 NOTE — Consult Note (Signed)
[place] Nurse wound follow up Wound type: sacral/gluteal cleft pressure injury Measurement:4cm x 2cm x 0.1cm and 1.0cm x 1.5cm x 0.1cm  Wound bed: some sloughing and superficial skin peeling. Distal wound still a little dark centrally but not eschar Drainage (amount, consistency, odor) minimal  Periwound: intact  Dressing procedure/placement/frequency:silicone foam to protect from further injury, turn and reposition patient to his side    [facility] Nurse team will follow along with you for weekly wound assessments.  Please notify me of any acute changes in the wounds or any new areas of concerns [place] MSN, RN,CWOCN, [place], [place]

## 2017-10-11 LAB — RENAL FUNCTION PANEL
ANION GAP: 9 (ref 5–15)
Albumin: 2.6 g/dL — ABNORMAL LOW (ref 3.5–5.0)
BUN: 22 mg/dL — ABNORMAL HIGH (ref 6–20)
CO2: 29 mmol/L (ref 22–32)
CREATININE: 3.11 mg/dL — AB (ref 0.61–1.24)
Calcium: 8.6 mg/dL — ABNORMAL LOW (ref 8.9–10.3)
Chloride: 99 mmol/L — ABNORMAL LOW (ref 101–111)
GFR calc non Af Amer: 20 mL/min — ABNORMAL LOW (ref >60)
GFR, EST AFRICAN AMERICAN: 23 mL/min — AB (ref >60)
Glucose, Bld: 77 mg/dL (ref 65–99)
Phosphorus: 4.4 mg/dL (ref 2.5–4.6)
Potassium: 4.5 mmol/L (ref 3.5–5.1)
Sodium: 137 mmol/L (ref 135–145)

## 2017-10-11 LAB — PROTIME-INR
INR: 2.11
Prothrombin Time: 23.5 seconds — ABNORMAL HIGH (ref 11.4–15.2)

## 2017-10-11 LAB — GLUCOSE, CAPILLARY
GLUCOSE-CAPILLARY: 73 mg/dL (ref 65–99)
GLUCOSE-CAPILLARY: 66 mg/dL (ref 65–99)
Glucose-Capillary: 104 mg/dL — ABNORMAL HIGH (ref 65–99)
Glucose-Capillary: 125 mg/dL — ABNORMAL HIGH (ref 65–99)
Glucose-Capillary: 75 mg/dL (ref 65–99)
Glucose-Capillary: 88 mg/dL (ref 65–99)

## 2017-10-11 MED ORDER — FUROSEMIDE 10 MG/ML IJ SOLN
160.0000 mg | Freq: Two times a day (BID) | INTRAMUSCULAR | Status: AC
  Administered 2017-10-11 – 2017-10-12 (×2): 160 mg via INTRAVENOUS
  Filled 2017-10-11 (×2): qty 16

## 2017-10-11 MED ORDER — WARFARIN SODIUM 4 MG PO TABS
4.0000 mg | ORAL_TABLET | Freq: Once | ORAL | Status: AC
  Administered 2017-10-11: 4 mg via ORAL
  Filled 2017-10-11: qty 1

## 2017-10-11 MED ORDER — DARBEPOETIN ALFA 150 MCG/0.3ML IJ SOSY
150.0000 ug | PREFILLED_SYRINGE | INTRAMUSCULAR | Status: DC
  Administered 2017-10-11 – 2017-10-25 (×3): 150 ug via SUBCUTANEOUS
  Filled 2017-10-11 (×3): qty 0.3

## 2017-10-11 MED ORDER — POLYETHYLENE GLYCOL 3350 17 G PO PACK
17.0000 g | PACK | Freq: Every day | ORAL | Status: DC
  Administered 2017-10-13 – 2017-10-20 (×2): 17 g via ORAL
  Filled 2017-10-11 (×9): qty 1

## 2017-10-11 NOTE — Progress Notes (Signed)
ANTICOAGULATION CONSULT NOTE - Follow Up Consult  Pharmacy Consult for Coumadin Indication: atrial fibrillation  Allergies  Allergen Reactions  . Penicillins Hives, Nausea And Vomiting, Swelling and Other (See Comments)    Tolerated Cefepime Has patient had a PCN reaction causing immediate rash, facial/tongue/throat swelling, SOB or lightheadedness with hypotension: YES Has patient had a PCN reaction causing severe rash involving mucus membranes or skin necrosis: No Has patient had a PCN reaction that required hospitalization No Has patient had a PCN reaction occurring within the last 10 years: No If all of the above answers are "NO", then may proceed with Cephalosporin use.     Patient Measurements: Height: 5' (152.4 cm) Weight: 171 lb 11.8 oz (77.9 kg) IBW/kg (Calculated) : 50 Heparin Dosing Weight: 68.7 kg   Vital Signs: Temp: 97.9 F (36.6 C) (03/06 0421) Temp Source: Oral (03/06 0421) BP: 176/73 (03/06 0421) Pulse Rate: 58 (03/06 0421)  Labs: Recent Labs    10/09/17 0725 10/09/17 1455 10/10/17 0310 10/10/17 0726 10/11/17 0258  HGB  --  8.0* 8.6*  --   --   HCT  --  25.2* 29.0*  --   --   PLT  --  128* 127*  --   --   LABPROT 25.6*  --  25.8*  --  23.5*  INR 2.36  --  2.38  --  2.11  CREATININE 4.07*  --  2.21* 2.42* 3.11*    Estimated Creatinine Clearance: 21 mL/min (A) (by C-G formula based on SCr of 3.11 mg/dL (H)).  Assessment: 41 yom on Xarelto 15mg  PTA for Afib (on med rec), not addressed initially in H&P by teaching service and Cards note says pt is NOT on anticoag 2/2 GIB in past.   Patient remains therapeutic on warfarin for Afib (INR is 2.11 today). Hg low but stable (8.6 yesterday), plt wnl but trending down (127). Dose was missed 3/4. No signs/symptoms of bleeding noted.   Goal of Therapy:  INR 2-3 Monitor platelets by anticoagulation protocol: Yes   Plan:  - Warfarin 4 mg po x 1 - Daily INR - Monitor CBC, for s/sx bleeding, DDI with  amiodarone  Doylene Canard, PharmD Clinical Pharmacist  Pager: [phone] Clinical Phone for 10/11/2017 until 3:30pm: [phone] If after 3:30pm, please call main pharmacy at [phone] 10/11/2017 11:11 AM

## 2017-10-11 NOTE — Progress Notes (Signed)
OT Cancellation Note  Patient Details Name: [name] MRN: [id number] DOB: January 23, 1956   Cancelled Treatment:    Reason Eval/Treat Not Completed: Other (comment): Spoke with pt's RN. Pt has just returned to bed after sitting up in chair, transferring to [name] for bowel movement, standing for pericare, and returning to bed. Pt with elevated HR and nausea after this increase in activity. OT thanked RN for getting pt up today and assisting with his mobility improvement. OT will check back as able to progress with plan of care.   Norman Herrlich, MS OTR/L  Pager: [place] A Sahand Gosch 10/11/2017, 4:30 PM

## 2017-10-11 NOTE — Progress Notes (Signed)
Subjective:  Patient seen laying comfortably in bed this AM in no acute distress. States that his breathing continues to improve. He notes increased urine output over the past 24 hours with lasix yesterday and states that he thinks his swelling has gone down with this medicine. He wants to get up and out of bed today and moved to the bedside chair. He is also motivated to move around the room. Encouraged patient to have staff help him accomplish these goals today.  Objective:  Vital signs in last 24 hours: Vitals:   10/10/17 1948 10/10/17 2143 10/11/17 0033 10/11/17 0421  BP: (!) 146/49  (!) 171/67 (!) 176/73  Pulse: 63  (!) 59 (!) 58  Resp: (!) 22  19 15   Temp: 97.7 F (36.5 C)  98.2 F (36.8 C) 97.9 F (36.6 C)  TempSrc: Oral  Oral Oral  SpO2: 99% 98% 100% 100%  Weight:    171 lb 11.8 oz (77.9 kg)  Height:       Physical Exam  Constitutional:  Appears older than stated age, chronically sick in no acute distress. 1L [place] in place, stably saturating >90% during interview.  HENT:  Mouth/Throat: Oropharynx is clear and moist. No oropharyngeal exudate.  Cardiovascular: Normal rate, regular rhythm and intact distal pulses. Exam reveals no friction rub.  No murmur heard. Respiratory:  No accessory muscle use or nasal flaring. No crackles or wheezing appreciated in anterior lung fields.  GI: Soft. Bowel sounds are normal. He exhibits no distension. There is no tenderness. There is no rebound.  Musculoskeletal: He exhibits edema (Trace-1+ pitting sacral edema, improved from yesterday's exam. No swelling in bilateral lowere extremities, unchanged from previous exams.). He exhibits no tenderness (Trace-1+ pitting sacral edema).  Skin: Skin is warm and dry. No rash noted. No erythema.   Assessment/Plan:  Principal Problem:   Acute respiratory failure with hypoxia (HCC) Active Problems:   DM (diabetes mellitus), type 2, uncontrolled ([place])   Essential hypertension   Paroxysmal atrial  fibrillation with rapid ventricular response (HCC)   COPD (chronic obstructive pulmonary disease) (HCC)   Chronic combined systolic and diastolic CHF (congestive heart failure) (HCC)   Ischemic cardiomyopathy   Right bundle branch block (RBBB) on electrocardiography   Tachy-brady syndrome (HCC)   Sinus pause   Influenza with respiratory manifestation   Aspiration pneumonia of both lower lobes (HCC)   AKI (acute kidney injury) ([place])   Pressure injury of skin   Anemia of chronic disease  Acute on Chronic Renal Failure:On intermittent HD via tunneled dialysis cather placed 2/25 for volume overload, latest session 10/09/2017. Cr 3.11 today, electrolyte abnormalities do not require dialysis at this time (K = 4.5, steady from yesterday 4.1). Patient received 160 mg Lasix yesterday x2 doses with 475 ml output recorded in response. No recorded output from 7 AM to 7 pm on 10/10/2017 - think this is likely charting error as daytime nurse recalls urine output yesterday and bedside exam showed >600 mL in foley bag. May consider additional lasix per nephrology recommendations, given he responded well to yesterday's regimen and has continued subjective improvement. -Nephrology consulting, recommendations appreciated  Acute Respiratory Failure 2/2 recent pneumonia, influenza, and volume overload: Patient stably saturating >97% on 2L in chart, only requires 0-1L during exam. Will continue to encourage nursing staff to wean oxygen to goal of 90-92% as ordered and continue pulmonary hygiene. Patient subjectively overall improving at rest, need to mobilize patient and observe oxygen saturation -Mobilize patient and monitor O2 saturations  with movement/in chair -Continue monitoring respiratory status -Incentive spirometry and flutter valve and out of bed to chair when able -Continue to wean as tolerated  PAF with RVR, Tachy-Brady Syndrome: Patient currently in NSR. Cardiology to follow up as an outpatient for  pacemaker placement. -Anticoagulation with Warfarin 3 mg daily, last INR 2.11 -Avoid metoprolol to prevent any bradycardia -Amiodarone 200mg  BID  DM: Current A1c on 09/28/17 was 7.9%. Recent CBG appropriate <100.  -Continue Lantus 14 units at bedtime with TID SSI at mealtime.  FEN/GI: -Dysphagia 2 diet with honey thick liquid per SLP  -No IVF, replace electrolytes as needed  VTE Prophylaxis: Heparin TID Code Status: Full  Dispo: Anticipated discharge pending kidney recovery vs permanent dialysis decision.  Thomasene Ripple, MD 10/11/2017, 6:45 AM Pager: [phone]

## 2017-10-11 NOTE — Progress Notes (Signed)
[facility] Rounding Note  Subjective/Interval History:  HD 3/1, 3/4 for volume mostly 2 doses IV lasix yesterday He thinks edema better Fair amount urine in foley does not seem reflected in I/O records Creatinine rising in interdialytic interval Appears weight back up 1 kg  Objective Vital signs in last 24 hours: Vitals:   10/11/17 0033 10/11/17 0421 10/11/17 0745 10/11/17 1125  BP: (!) 171/67 (!) 176/73  (!) 170/64  Pulse: (!) 59 (!) 58  60  Resp: 19 15    Temp: 98.2 F (36.8 C) 97.9 F (36.6 C)    TempSrc: Oral Oral    SpO2: 100% 100% 100%   Weight:  77.9 kg (171 lb 11.8 oz)    Height:       Weight change: -0.5 kg (-1.6 oz)  Intake/Output Summary (Last 24 hours) at 10/11/2017 1308 Last data filed at 10/11/2017 0033 Gross per 24 hour  Intake 76 ml  Output 475 ml  Net -399 ml   Physical Exam:  Blood pressure (!) 170/64, pulse 60, temperature 97.9 F (36.6 C), temperature source Oral, resp. rate 15, height 5' (1.524 m), weight 77.9 kg (171 lb 11.8 oz), SpO2 100 %. Pale, deconditioned, very pleasant Wearing O2 Ant coarse breath sounds Crackles at bases persist Chest sternotomy scar R TDC catheter S1S2 NoS3 Abd soft not tender with pitting edema esp R abd wall LE pitting edema 1+   Recent Labs  Lab 10/06/17 0350 10/07/17 0407 10/08/17 0904 10/09/17 0725 10/10/17 0310 10/10/17 0726 10/11/17 0258  NA 137 138 136 136 136 136 137  K 3.9 4.4 4.8 5.2* 3.8 4.1 4.5  CL 99* 100* 101 100* 98* 99* 99*  CO2 27 28 23 27 28 29 29   GLUCOSE 84 87 83 72 111* 87 77  BUN 16 7 18  25* 10 12 22*  CREATININE 3.16* 2.03* 3.32* 4.07* 2.21* 2.42* 3.11*  CALCIUM 8.7* 8.5* 8.7* 8.6* 8.2* 8.4* 8.6*  PHOS 3.2 2.3* 3.1 4.0 3.2 3.6 4.4    Recent Labs  Lab 10/10/17 0310 10/10/17 0726 10/11/17 0258  ALBUMIN 2.5* 2.5* 2.6*   Recent Labs  Lab 10/07/17 0407 10/08/17 0904 10/09/17 1455 10/10/17 0310  WBC 6.0 6.5 6.0 5.1  HGB 8.6* 8.8* 8.0* 8.6*  HCT 28.4* 29.0* 25.2* 29.0*  MCV 91.0  93.5 91.6 92.7  PLT 139* 151 128* 127*    Recent Labs  Lab 10/10/17 1622 10/10/17 1958 10/11/17 0038 10/11/17 0425 10/11/17 0801  GLUCAP 90 137* 75 73 66    Iron/TIBC/Ferritin/ %Sat    Component Value Date/Time   IRON 17 (L) 09/30/2017 1435   TIBC 133 (L) 09/30/2017 1435   FERRITIN 87 09/30/2017 1435   IRONPCTSAT 13 (L) 09/30/2017 1435   IRONPCTSAT 8 (L) 12/13/2012 1554   Medications:  . amiodarone  200 mg Oral BID  . atorvastatin  80 mg Oral Daily  . chlorhexidine  15 mL Mouth Rinse BID  . coumadin book   Does not apply Once  . darbepoetin (ARANESP) injection - DIALYSIS  150 mcg Intravenous Q Wed-HD  . hydrocerin   Topical BID  . insulin aspart  0-20 Units Subcutaneous TID WC  . insulin glargine  14 Units Subcutaneous QHS  . ipratropium-albuterol  3 mL Nebulization BID  . mouth rinse  15 mL Mouth Rinse q12n4p  . multivitamin  1 tablet Oral QHS  . pantoprazole  40 mg Oral Daily  . PARoxetine  40 mg Oral Daily  . polyethylene glycol  17 g Oral  Daily  . senna  1 tablet Oral BID  . senna-docusate  1 tablet Oral QHS  . warfarin  4 mg Oral ONCE-1800  . warfarin   Does not apply Once  . Warfarin - Pharmacist Dosing Inpatient   Does not apply q1800   Dg Chest 2 View Result Date: 10/10/2017 CLINICAL DATA:  History of pulmonary edema, end-stage renal disease, COPD EXAM: CHEST  2 VIEW COMPARISON:  Portable chest x-ray of 10/02/2016 and chest x-ray of 09/18/2017 FINDINGS: The lungs again are not optimally aerated and there are persistent changes of CHF with pulmonary vascular congestion, cardiomegaly, and small pleural effusions right greater than left. Right dialysis catheter tip overlies the region of the right atrium and cardiomegaly is stable. Median sternotomy sutures are unchanged. IMPRESSION: 1. Little change in CHF pattern with bilateral pleural effusions. 2. Suboptimal aeration. Electronically Signed   By: Ivar Drape M.D.   On: 10/10/2017 08:06   Background: 62 yo male,  admitted 2/9 w/AF RVR, temp pacer required 2/2 long pauses w/hypotension, Flu A +, hypoxic resp failure (required vent support). AKI in this setting (ATN rel shock/sepsis vs vanco), CRRT 2/14 - 2/18. 1st HD 09/27/17 and requiring intermittently. Baseline creatinine PTA appears to have been around 1.6-1.7 past year.   Assessment/Recommendations  1. AKI on CKD3 - shock/+/-vanco/sepsis related ATN. CRRT 2/14 - 2/18; HD required intermittently since 09/27/17. Now 3 weeks into AKI.  Making some but not great urine. HD 3/1 and 34 for volume mostly. Creatinine rising in interdialytic interval. Will redose with lasix today but suspect will need HD for vol again next couple of days. Continue to trend labs, exam, UOP.  2. PAF tachy/brady syndrome - amio/coumadin 3. S/p flu/PNA - post ATB's/tamiflu 4. Systolic HF - EF 16-10%. Despite volume removal with HD CXR 3/5 still with vascular congestion and pleural effusions/exam matches  CXR.  5. Anemia on Aranesp 150 "QWed w/HD"  Last dosed 10/04/17. Due 3/6 today. Order changed so will get when not receiving HD. Fe studies 2/23 sig fe def. Had 2 doses Feraheme per pharmacy (2/23, 2/25) 6. DM per primary team 7. Dysphagia - D3 diet  Jamal Maes, MD [facility] [phone] pager 10/11/2017, 1:08 PM

## 2017-10-11 NOTE — Progress Notes (Signed)
  Speech Language Pathology Treatment: Dysphagia  Patient Details Name: [name] MRN: [id number] DOB: July 10, 1956 Today's Date: 10/11/2017 Time: 1130-1140 SLP Time Calculation (min) (ACUTE ONLY): 10 min  Assessment / Plan / Recommendation Clinical Impression  Pt was seen for skilled ST targeting dysphagia goals.  SLP facilitated the session with trials of water following oral care to continue working towards liquids progression.  Pt needed min verbal cues to recall parameters of the water protocol (oral care before trials, water only in between meals).  He utilized small sips with mod I and demonstrated no overt s/s of aspiration with trials.  Trials were limited due to pt requesting to have a bowel movement shortly after session began.  Pt was handed off to nursing at the end of session.  Would recommend that pt continue with trials of water in between meals.  Continue per current plan of care.    HPI HPI: 62 yo M with History of paroxysmal A. Fib, DM, CHF, CAD, CABG 2017, carotid stenosis (S/P L Carotid Endarterectomy), GERD, CVA, COPD, HTN, Abdominal Hernia, and Diabetes presented  generalized weakness and was found to have flu and was in A. Fib with RVR. Developed AKI requiring transient CRRT. Acute hypoxemic respiratory failure with bilateral pulmonary infiltrates. Intubated 2/12-2/21. CXR 2/22 increasing interstitial prominence, likely interstitial edema. Increasing bibasilar atelectasis. BSE 08/12/16 primary esophageal dysphagia. Esophagram same day no esophageal obstruction or stricture. Presbyesophagus. penetration of barium to the level of the vocal cords occurred during the exam and did elicit intermittent coughing. MBS 09/29/17 recommended D2, honey thick liquids. Repeat MBS recommended to assess diet/liquid upgrade.      SLP Plan  Continue with current plan of care       Recommendations  Diet recommendations: Dysphagia 3 (mechanical soft);Nectar-thick liquid;Other(comment)(sips  of water in between meals) Liquids provided via: Cup Medication Administration: Whole meds with puree Supervision: Patient able to self feed;Intermittent supervision to cue for compensatory strategies Compensations: Minimize environmental distractions;Slow rate;Small sips/bites;Clear throat intermittently Postural Changes and/or Swallow Maneuvers: Seated upright 90 degrees                Oral Care Recommendations: Oral care BID Follow up Recommendations: Skilled Nursing facility SLP Visit Diagnosis: Dysphagia, pharyngeal phase (R13.13) Plan: Continue with current plan of care       GO                Llana Deshazo, Selinda Orion 10/11/2017, 11:45 AM

## 2017-10-12 LAB — GLUCOSE, CAPILLARY
GLUCOSE-CAPILLARY: 105 mg/dL — AB (ref 65–99)
GLUCOSE-CAPILLARY: 130 mg/dL — AB (ref 65–99)
GLUCOSE-CAPILLARY: 140 mg/dL — AB (ref 65–99)
GLUCOSE-CAPILLARY: 171 mg/dL — AB (ref 65–99)
GLUCOSE-CAPILLARY: 93 mg/dL (ref 65–99)
GLUCOSE-CAPILLARY: 95 mg/dL (ref 65–99)

## 2017-10-12 LAB — RENAL FUNCTION PANEL
Albumin: 2.5 g/dL — ABNORMAL LOW (ref 3.5–5.0)
Anion gap: 10 (ref 5–15)
BUN: 30 mg/dL — AB (ref 6–20)
CHLORIDE: 100 mmol/L — AB (ref 101–111)
CO2: 27 mmol/L (ref 22–32)
CREATININE: 3.67 mg/dL — AB (ref 0.61–1.24)
Calcium: 8.5 mg/dL — ABNORMAL LOW (ref 8.9–10.3)
GFR calc Af Amer: 19 mL/min — ABNORMAL LOW (ref >60)
GFR, EST NON AFRICAN AMERICAN: 16 mL/min — AB (ref >60)
Glucose, Bld: 106 mg/dL — ABNORMAL HIGH (ref 65–99)
Phosphorus: 5.4 mg/dL — ABNORMAL HIGH (ref 2.5–4.6)
Potassium: 3.8 mmol/L (ref 3.5–5.1)
Sodium: 137 mmol/L (ref 135–145)

## 2017-10-12 LAB — PROTIME-INR
INR: 2.35
PROTHROMBIN TIME: 25.5 s — AB (ref 11.4–15.2)

## 2017-10-12 MED ORDER — DEXTROSE 5 % IV SOLN
160.0000 mg | Freq: Two times a day (BID) | INTRAVENOUS | Status: DC
  Administered 2017-10-13 – 2017-10-17 (×9): 160 mg via INTRAVENOUS
  Filled 2017-10-12 (×4): qty 16
  Filled 2017-10-12: qty 10
  Filled 2017-10-12: qty 2
  Filled 2017-10-12 (×4): qty 16

## 2017-10-12 MED ORDER — WARFARIN SODIUM 3 MG PO TABS
3.0000 mg | ORAL_TABLET | Freq: Once | ORAL | Status: AC
  Administered 2017-10-12: 3 mg via ORAL
  Filled 2017-10-12: qty 1

## 2017-10-12 MED ORDER — DEXTROSE 5 % IV SOLN
160.0000 mg | Freq: Two times a day (BID) | INTRAVENOUS | Status: DC
  Administered 2017-10-12: 160 mg via INTRAVENOUS
  Filled 2017-10-12 (×2): qty 16

## 2017-10-12 NOTE — Progress Notes (Signed)
Occupational Therapy Treatment Patient Details Name: [name] MRN: [id number] DOB: [birth year] Today's Date: 10/12/2017    History of present illness Pt is a 62 y.o. male admitted 09/15/17 with generalized weakness, cough, and dizziness; found to be back in a-fib with rapid rate. S/p temporary pacemaker placement 2/8; electrophysiology avoiding PPM implant due to pt's co-morbidities. Intubated 2/12-2/21. Also with acute on chronic renal failure; s/p R IJ tunnelled dialysis cath placement 10/02/17. PMH includes CHF, CAD (s/p CABG 2017), carotid stenosis (s/p L carotid endarterectomy), CVA, COPD, HTN, DM, intellectual disability.    OT comments  Pt making progress with functional goals, although he declined OOB activity but agreeable to sit EOB for session. OT will continue to follow acutely  Follow Up Recommendations  SNF;Supervision/Assistance - 24 hour    Equipment Recommendations  Wheelchair (measurements OT);Wheelchair cushion (measurements OT);3 in 1 bedside commode;Other (comment)    Recommendations for Other Services      Precautions / Restrictions Precautions Precautions: Fall Precaution Comments: s/p RIJ tunnelled cath 2/25 Restrictions Weight Bearing Restrictions: No       Mobility Bed Mobility Overal bed mobility: Needs Assistance Bed Mobility: Supine to Sit     Supine to sit: Max assist Sit to supine: Max assist      Transfers                 General transfer comment: pt declined due to worry about having BM before getting to [facility] pt to recliner and "making a mess"    Balance Overall balance assessment: Needs assistance Sitting-balance support: Bilateral upper extremity supported;Feet unsupported Sitting balance-Leahy Scale: Poor   Postural control: Posterior lean;Right lateral lean                                 ADL either performed or assessed with clinical judgement   ADL Overall ADL's : Needs assistance/impaired      Grooming: Sitting;Supervision/safety;Min guard(close sup)   Upper Body Bathing: Minimal assistance;Sitting Upper Body Bathing Details (indicate cue type and reason): simulated     Upper Body Dressing : Maximal assistance;Moderate assistance;Sitting         Toilet Transfer Details (indicate cue type and reason): pt declined   Toileting - Clothing Manipulation Details (indicate cue type and reason): foley cath currently, unable to reach bottom in current condition for peri care             Vision Baseline Vision/History: Wears glasses Patient Visual Report: No change from baseline     Perception     Praxis      Cognition Arousal/Alertness: Awake/alert Behavior During Therapy: WFL for tasks assessed/performed Overall Cognitive Status: History of cognitive impairments - at baseline Area of Impairment: Attention;Memory;Following commands;Safety/judgement;Awareness;Problem solving                       Following Commands: Follows one step commands consistently Safety/Judgement: Decreased awareness of deficits;Decreased awareness of safety   Problem Solving: Slow processing;Decreased initiation;Requires verbal cues;Requires tactile cues          Exercises Other Exercises Other Exercises: B UE ROM exercises in multiple planes seated EOB, required frequent rest breaks to complete and cues to hold head/neck upright   Shoulder Instructions       General Comments      Pertinent Vitals/ Pain       Pain Assessment: 0-10 Pain Score: 3  Pain Location: rear/sacral area  Pain Descriptors / Indicators: Sore Pain Intervention(s): Monitored during session;Repositioned  Home Living                                          Prior Functioning/Environment              Frequency  Min 2X/week        Progress Toward Goals  OT Goals(current goals can now be found in the care plan section)  Progress towards OT goals: Progressing toward  goals     Plan      Co-evaluation                 AM-PAC PT "6 Clicks" Daily Activity     Outcome Measure   Help from another person eating meals?: A Little Help from another person taking care of personal grooming?: A Little Help from another person toileting, which includes using toliet, bedpan, or urinal?: Total Help from another person bathing (including washing, rinsing, drying)?: A Lot Help from another person to put on and taking off regular upper body clothing?: A Lot Help from another person to put on and taking off regular lower body clothing?: Total 6 Click Score: 12    End of Session    OT Visit Diagnosis: Unsteadiness on feet (R26.81);Other abnormalities of gait and mobility (R26.89);Muscle weakness (generalized) (M62.81);Other symptoms and signs involving cognitive function;Adult, failure to thrive (R62.7)   Activity Tolerance Patient limited by fatigue   Patient Left with call bell/phone within reach;in bed   Nurse Communication Mobility status    Functional Assessment Tool Used: AM-PAC 6 Clicks Daily Activity   Time: 0034-9179 OT Time Calculation (min): 24 min  Charges: OT G-codes **NOT FOR INPATIENT CLASS** Functional Assessment Tool Used: AM-PAC 6 Clicks Daily Activity OT General Charges $OT Visit: 1 Visit OT Treatments $Self Care/Home Management : 8-22 mins $Therapeutic Activity: 8-22 mins     Britt Bottom 10/12/2017, 1:07 PM

## 2017-10-12 NOTE — Progress Notes (Signed)
ANTICOAGULATION CONSULT NOTE - Follow Up Consult  Pharmacy Consult for Coumadin Indication: atrial fibrillation  Allergies  Allergen Reactions  . Penicillins Hives, Nausea And Vomiting, Swelling and Other (See Comments)    Tolerated Cefepime Has patient had a PCN reaction causing immediate rash, facial/tongue/throat swelling, SOB or lightheadedness with hypotension: YES Has patient had a PCN reaction causing severe rash involving mucus membranes or skin necrosis: No Has patient had a PCN reaction that required hospitalization No Has patient had a PCN reaction occurring within the last 10 years: No If all of the above answers are "NO", then may proceed with Cephalosporin use.     Patient Measurements: Height: 5' (152.4 cm) Weight: 165 lb 12.6 oz (75.2 kg) IBW/kg (Calculated) : 50 Heparin Dosing Weight: 68.7 kg   Vital Signs: Temp: 98 F (36.7 C) (03/07 0523) BP: 169/91 (03/07 0523) Pulse Rate: 82 (03/07 0900)  Labs: Recent Labs    10/09/17 1455  10/10/17 0310 10/10/17 0726 10/11/17 0258 10/12/17 0205  HGB 8.0*  --  8.6*  --   --   --   HCT 25.2*  --  29.0*  --   --   --   PLT 128*  --  127*  --   --   --   LABPROT  --   --  25.8*  --  23.5* 25.5*  INR  --   --  2.38  --  2.11 2.35  CREATININE  --    < > 2.21* 2.42* 3.11* 3.67*   < > = values in this interval not displayed.    Estimated Creatinine Clearance: 17.5 mL/min (A) (by C-G formula based on SCr of 3.67 mg/dL (H)).  Assessment: 75 yom on Xarelto 15mg  PTA for Afib (on med rec), not addressed initially in H&P by teaching service and Cards note says pt is NOT on anticoag 2/2 GIB in past.   Patient remains therapeutic on warfarin for Afib (INR is 2.35 today). Hg low but stable (8.6), plt wnl but trending down (127) upon last check. No signs/symptoms of bleeding noted.   Goal of Therapy:  INR 2-3 Monitor platelets by anticoagulation protocol: Yes   Plan:  - Warfarin 3 mg po x 1 - Daily INR - Monitor CBC, for  s/sx bleeding, DDI with amiodarone  Doylene Canard, PharmD Clinical Pharmacist  Pager: [phone] Clinical Phone for 10/12/2017 until 3:30pm: [phone] If after 3:30pm, please call main pharmacy at [phone] 10/12/2017 11:10 AM

## 2017-10-12 NOTE — Progress Notes (Signed)
Physical Therapy Treatment Patient Details Name: [name] MRN: [id number] DOB: 1956/05/30 Today's Date: 10/12/2017    History of Present Illness Pt is a 62 y.o. male admitted 09/15/17 with generalized weakness, cough, and dizziness; found to be back in a-fib with rapid rate. S/p temporary pacemaker placement 2/8; electrophysiology avoiding PPM implant due to pt's co-morbidities. Intubated 2/12-2/21. Also with acute on chronic renal failure; s/p R IJ tunnelled dialysis cath placement 10/02/17. PMH includes CHF, CAD (s/p CABG 2017), carotid stenosis (s/p L carotid endarterectomy), CVA, COPD, HTN, DM, intellectual disability.     PT Comments    Pt performed increased activity and able to bearing weight on B feet to turn and pivot from bed to recliner.  Pt asking for more assistance then needed but reassured he could perform transfer and participate in a safe manner.  Pt remains to require SNF at d/c to improve strength and functional mobility.      Follow Up Recommendations  SNF;Supervision/Assistance - 24 hour     Equipment Recommendations  None recommended by PT    Recommendations for Other Services       Precautions / Restrictions Precautions Precautions: Fall Precaution Comments: s/p RIJ tunnelled cath 2/25 Restrictions Weight Bearing Restrictions: No    Mobility  Bed Mobility Overal bed mobility: Needs Assistance Bed Mobility: Supine to Sit     Supine to sit: Mod assist Sit to supine: Max assist   General bed mobility comments: Pt performed supine to sit and able to advance LEs to edge of bed and elevate into sitting with +1 moderate assistance.  Pt reports he can't but when asked to try he does fairly whell with decreased assistance.    Transfers Overall transfer level: Needs assistance Equipment used: Rolling walker (2 wheeled) Transfers: Squat Pivot Transfers Sit to Stand: Max assist;+2 physical assistance         General transfer comment: Cues for hand  placement to and from.  Pt able to stand with flexed posture and then required increased time to turn and sit.    Ambulation/Gait Ambulation/Gait assistance: (NT only able to shuffle feet with little to no foot clearance to sit  in the recliner chair.  )               Stairs            Wheelchair Mobility    Modified Rankin (Stroke Patients Only)       Balance Overall balance assessment: Needs assistance Sitting-balance support: Bilateral upper extremity supported;Feet unsupported Sitting balance-Leahy Scale: Poor Sitting balance - Comments: tends to intermittently fall to R/posterior direction  Postural control: Posterior lean   Standing balance-Leahy Scale: Zero Standing balance comment: reliant on therapist support                             Cognition Arousal/Alertness: Awake/alert Behavior During Therapy: WFL for tasks assessed/performed Overall Cognitive Status: History of cognitive impairments - at baseline Area of Impairment: Attention;Memory;Following commands;Safety/judgement;Awareness;Problem solving                       Following Commands: Follows one step commands consistently Safety/Judgement: Decreased awareness of deficits;Decreased awareness of safety   Problem Solving: Slow processing;Decreased initiation;Requires verbal cues;Requires tactile cues        Exercises Other Exercises Other Exercises: B UE ROM exercises in multiple planes seated EOB, required frequent rest breaks to complete and cues to hold head/neck  upright    General Comments        Pertinent Vitals/Pain Pain Assessment: Faces Pain Score: 3  Faces Pain Scale: Hurts little more Pain Location: rear/sacral area  Pain Descriptors / Indicators: Sore Pain Intervention(s): Monitored during session;Repositioned    Home Living                      Prior Function            PT Goals (current goals can now be found in the care plan section)  Acute Rehab PT Goals Patient Stated Goal: Get stronger Potential to Achieve Goals: Good Progress towards PT goals: Progressing toward goals    Frequency    Min 2X/week      PT Plan Current plan remains appropriate    Co-evaluation              AM-PAC PT "6 Clicks" Daily Activity  Outcome Measure  Difficulty turning over in bed (including adjusting bedclothes, sheets and blankets)?: Unable Difficulty moving from lying on back to sitting on the side of the bed? : Unable Difficulty sitting down on and standing up from a chair with arms (e.g., wheelchair, bedside commode, etc,.)?: Unable Help needed moving to and from a bed to chair (including a wheelchair)?: Total Help needed walking in hospital room?: Total Help needed climbing 3-5 steps with a railing? : Total 6 Click Score: 6    End of Session Equipment Utilized During Treatment: Gait belt;Oxygen Activity Tolerance: Patient tolerated treatment well Patient left: in bed;with call bell/phone within reach Nurse Communication: Mobility status PT Visit Diagnosis: Other abnormalities of gait and mobility (R26.89);Muscle weakness (generalized) (M62.81);Difficulty in walking, not elsewhere classified (R26.2)     Time: 9470-9628 PT Time Calculation (min) (ACUTE ONLY): 17 min  Charges:  $Therapeutic Activity: 8-22 mins                    G Codes:       Governor Rooks, PTA pager [phone]    Cristela Blue 10/12/2017, 2:50 PM

## 2017-10-12 NOTE — Progress Notes (Signed)
Subjective:  Patient seen laying comfortably in bed this AM in no acute distress. States his breathing seems to be improved from yesterday, only short of breath this AM immediately upon wakening. Improved cough as well. Patient states he sat in chair for some time yesterday and was glad he was able to do that.   Objective:  Vital signs in last 24 hours: Vitals:   10/11/17 1125 10/11/17 1945 10/11/17 2015 10/12/17 0523  BP: (!) 170/64  102/63 (!) 169/91  Pulse: 60 95  (!) 112  Resp:  (!) 22 18 19   Temp:   97.9 F (36.6 C) 98 F (36.7 C)  TempSrc:   Oral   SpO2:  95% 95% 96%  Weight:    165 lb 12.6 oz (75.2 kg)  Height:       Physical Exam  Constitutional:  Appears older than stated age in no acute distress  Cardiovascular: Normal rate, regular rhythm and intact distal pulses. Exam reveals no friction rub.  No murmur heard. Respiratory:  Saturating >90% on room air. No accessory muscle use or nasal flaring. Speaking in full sentences. Continued bibasilar crackles with decreased breath sounds overall at lung bases compared to upper lung fields, slightly improved from previous exams.  GI: Soft. He exhibits no distension. There is no tenderness. There is no rebound.  Musculoskeletal: He exhibits edema (Trace sacral edema, slightly improved from yesterday's exam). He exhibits no tenderness (of bilateral lower extremities).  Skin: Skin is warm and dry. No rash noted. No erythema.   Assessment/Plan:  Principal Problem:   Acute respiratory failure with hypoxia (HCC) Active Problems:   DM (diabetes mellitus), type 2, uncontrolled ([place])   Essential hypertension   Paroxysmal atrial fibrillation with rapid ventricular response (HCC)   COPD (chronic obstructive pulmonary disease) (HCC)   Chronic combined systolic and diastolic CHF (congestive heart failure) (HCC)   Ischemic cardiomyopathy   Right bundle branch block (RBBB) on electrocardiography   Tachy-brady syndrome (HCC)   Sinus  pause   Influenza with respiratory manifestation   Aspiration pneumonia of both lower lobes (HCC)   AKI (acute kidney injury) ([place])   Pressure injury of skin   Anemia of chronic disease  Acute on Chronic Renal Failure:On intermittent HD via tunneled dialysis cather placed 2/60for volume overload, latest session 10/09/2017. Cr 3.6 today, increased from 3.1 today. Electrolyte abnormalities do not require dialysis at this time (K = 3.8 today). Patient received 160 mg Lasix yesterday x2 dose again yesterday - 24 hour urine output 1.2L out. Nephrology suggests continued lasix today and possible dialysis for continued volume overload yesterday.  -Nephrology consulting, recommendations appreciated  AcuteRespiratory Failure 2/2 recent pneumonia, influenza,and volume overload: Patient stably saturating >96% onroom air today, no signs of respiratory distress on exam today.  -Mobilize patient and monitor O2 saturations with movement/in chair -Continue monitoring respiratory status -Incentive spirometry and flutter valveand out of bed to chair when able -Continue to wean as tolerated  PAF with RVR, Tachy-Brady Syndrome: Patient currently in NSR. Cardiology to follow up as an outpatient for pacemaker placement. -Anticoagulation with Warfarin 3-4 mg daily, last INR 2.35 today -Avoid metoprolol to prevent any bradycardia -Amiodarone 200mg  BID  DM: Current A1c on 09/28/17 was 7.9%. Recent CBG appropriate.  -Continue Lantus 14 units at bedtime withTID SSI at mealtime.  FEN/GI: -Dysphagia 2 diet with honey thick liquid per SLP  -No IVF, dialysis PRN for electrolyte abnormalities -Pantoprazole 40 mg daily  VTE Prophylaxis:Heparin TID Code Status: Full  Dispo:  Anticipated discharge pending clinical improvement.  Thomasene Ripple, MD 10/12/2017, 6:45 AM Pager: [phone]

## 2017-10-12 NOTE — Progress Notes (Signed)
CSW following patient for disposition plan. Patient has bed at [facility] once medically stable      Rhea Pink, MSW,  Fellows

## 2017-10-12 NOTE — Progress Notes (Signed)
[facility] Rounding Note  Subjective/Interval History:  HD 3/1, 3/4 for volume mostly  Only 1 dose lasix yesterday (apparently somebody forgot to unclamp the tube when they hung it) so UOP might have been better had he gotten both doses  Creatinine unfortunately continues to rise  Objective Vital signs in last 24 hours: Vitals:   10/12/17 0500 10/12/17 0523 10/12/17 0857 10/12/17 0900  BP:  (!) 169/91    Pulse: 92 (!) 112  82  Resp: (!) 21 19  20   Temp:  98 F (36.7 C)    TempSrc:      SpO2: 94% 96% 100% 100%  Weight:  75.2 kg (165 lb 12.6 oz)    Height:       Weight change: -2.7 kg (-15.2 oz)  Intake/Output Summary (Last 24 hours) at 10/12/2017 1117 Last data filed at 10/12/2017 0758 Gross per 24 hour  Intake 306 ml  Output 1691 ml  Net -1385 ml   Physical Exam:  Blood pressure (!) 169/91, pulse 82, temperature 98 F (36.7 C), resp. rate 20, height 5' (1.524 m), weight 75.2 kg (165 lb 12.6 oz), SpO2 100 %.  Pale, deconditioned, very pleasant Wearing O2 Ant coarse breath sounds Crackles at bases persist Chest sternotomy scar R TDC catheter S1S2 NoS3 Abd soft not tender with pitting edema esp R abd wall LE pitting edema 1+ Abrasions and scabs LE's Fair amount urine in urinometer  Recent Labs  Lab 10/07/17 0407 10/08/17 0904 10/09/17 0725 10/10/17 0310 10/10/17 0726 10/11/17 0258 10/12/17 0205  NA 138 136 136 136 136 137 137  K 4.4 4.8 5.2* 3.8 4.1 4.5 3.8  CL 100* 101 100* 98* 99* 99* 100*  CO2 28 23 27 28 29 29 27   GLUCOSE 87 83 72 111* 87 77 106*  BUN 7 18 25* 10 12 22* 30*  CREATININE 2.03* 3.32* 4.07* 2.21* 2.42* 3.11* 3.67*  CALCIUM 8.5* 8.7* 8.6* 8.2* 8.4* 8.6* 8.5*  PHOS 2.3* 3.1 4.0 3.2 3.6 4.4 5.4*    Recent Labs  Lab 10/10/17 0726 10/11/17 0258 10/12/17 0205  ALBUMIN 2.5* 2.6* 2.5*   Recent Labs  Lab 10/07/17 0407 10/08/17 0904 10/09/17 1455 10/10/17 0310  WBC 6.0 6.5 6.0 5.1  HGB 8.6* 8.8* 8.0* 8.6*  HCT 28.4* 29.0* 25.2* 29.0*  MCV  91.0 93.5 91.6 92.7  PLT 139* 151 128* 127*    Recent Labs  Lab 10/11/17 1709 10/11/17 2014 10/12/17 0000 10/12/17 0454 10/12/17 0811  GLUCAP 104* 125* 105* 93 95    Iron/TIBC/Ferritin/ %Sat    Component Value Date/Time   IRON 17 (L) 09/30/2017 1435   TIBC 133 (L) 09/30/2017 1435   FERRITIN 87 09/30/2017 1435   IRONPCTSAT 13 (L) 09/30/2017 1435   IRONPCTSAT 8 (L) 12/13/2012 1554   Medications:  . amiodarone  200 mg Oral BID  . atorvastatin  80 mg Oral Daily  . chlorhexidine  15 mL Mouth Rinse BID  . coumadin book   Does not apply Once  . darbepoetin (ARANESP) injection - NON-DIALYSIS  150 mcg Subcutaneous Q Wed-1800  . hydrocerin   Topical BID  . insulin aspart  0-20 Units Subcutaneous TID WC  . insulin glargine  14 Units Subcutaneous QHS  . ipratropium-albuterol  3 mL Nebulization BID  . mouth rinse  15 mL Mouth Rinse q12n4p  . multivitamin  1 tablet Oral QHS  . pantoprazole  40 mg Oral Daily  . PARoxetine  40 mg Oral Daily  . polyethylene glycol  17 g Oral Daily  . senna  1 tablet Oral BID  . senna-docusate  1 tablet Oral QHS  . warfarin  3 mg Oral ONCE-1800  . warfarin   Does not apply Once  . Warfarin - Pharmacist Dosing Inpatient   Does not apply q1800   Dg Chest 2 View Result Date: 10/10/2017 CLINICAL DATA:  History of pulmonary edema, end-stage renal disease, COPD EXAM: CHEST  2 VIEW COMPARISON:  Portable chest x-ray of 10/02/2016 and chest x-ray of 09/18/2017 FINDINGS: The lungs again are not optimally aerated and there are persistent changes of CHF with pulmonary vascular congestion, cardiomegaly, and small pleural effusions right greater than left. Right dialysis catheter tip overlies the region of the right atrium and cardiomegaly is stable. Median sternotomy sutures are unchanged. IMPRESSION: 1. Little change in CHF pattern with bilateral pleural effusions. 2. Suboptimal aeration. Electronically Signed   By: Ivar Drape M.D.   On: 10/10/2017 08:06    Background:  62 yo male, admitted 2/9 w/AF RVR, temp pacer required 2/2 long pauses w/hypotension, Flu A +, hypoxic resp failure (required vent support). AKI in this setting (ATN rel shock/sepsis vs vanco), CRRT 2/14 - 2/18. 1st HD 09/27/17 and requiring intermittently. Baseline creatinine PTA appears to have been around 1.6-1.7 past year.   Assessment/Recommendations  1. AKI on CKD3 - shock/+/-vanco/sepsis related ATN. CRRT 2/14 - 2/18; HD required intermittently since 09/27/17. Now 3 weeks into AKI.  Making some but not great urine. HD 3/1 and 34 for volume mostly. Creatinine rising in interdialytic interval. Lasix helping UOP, but creatinine not reaching plateau off HD 1. Continue lasix 160 Q12H/trend labs and exam 2. If creatinine up again tomorrow  will probably plan for HD (dep on volume status) 2. PAF tachy/brady syndrome - amio/coumadin 3. S/p flu/PNA - post ATB's/tamiflu 4. Systolic HF - EF 93-81%. Despite volume removal with HD CXR 3/5 still with vascular congestion and pleural effusions and exam to match. 5. Anemia on Aranesp 150 "QWed w/HD"  Last dosed 10/04/17. Due 3/6 today. Order changed so will get when not receiving HD. Fe studies 2/23 sig fe def. Had 2 doses Feraheme per pharmacy (2/23, 2/25) 6. DM per primary team 7. Dysphagia - D3 diet  Jamal Maes, MD [facility] [phone] pager 10/12/2017, 11:17 AM

## 2017-10-13 ENCOUNTER — Inpatient Hospital Stay (HOSPITAL_COMMUNITY): Payer: Medicare (Managed Care)

## 2017-10-13 LAB — CBC
HEMATOCRIT: 31.2 % — AB (ref 39.0–52.0)
Hemoglobin: 9.7 g/dL — ABNORMAL LOW (ref 13.0–17.0)
MCH: 28.4 pg (ref 26.0–34.0)
MCHC: 31.1 g/dL (ref 30.0–36.0)
MCV: 91.5 fL (ref 78.0–100.0)
PLATELETS: 135 10*3/uL — AB (ref 150–400)
RBC: 3.41 MIL/uL — ABNORMAL LOW (ref 4.22–5.81)
RDW: 20 % — AB (ref 11.5–15.5)
WBC: 5.4 10*3/uL (ref 4.0–10.5)

## 2017-10-13 LAB — RENAL FUNCTION PANEL
ALBUMIN: 2.7 g/dL — AB (ref 3.5–5.0)
Anion gap: 11 (ref 5–15)
BUN: 35 mg/dL — ABNORMAL HIGH (ref 6–20)
CALCIUM: 8.6 mg/dL — AB (ref 8.9–10.3)
CO2: 27 mmol/L (ref 22–32)
CREATININE: 4.01 mg/dL — AB (ref 0.61–1.24)
Chloride: 98 mmol/L — ABNORMAL LOW (ref 101–111)
GFR calc Af Amer: 17 mL/min — ABNORMAL LOW (ref >60)
GFR, EST NON AFRICAN AMERICAN: 15 mL/min — AB (ref >60)
Glucose, Bld: 83 mg/dL (ref 65–99)
PHOSPHORUS: 4.6 mg/dL (ref 2.5–4.6)
POTASSIUM: 4.5 mmol/L (ref 3.5–5.1)
Sodium: 136 mmol/L (ref 135–145)

## 2017-10-13 LAB — GLUCOSE, CAPILLARY
GLUCOSE-CAPILLARY: 107 mg/dL — AB (ref 65–99)
GLUCOSE-CAPILLARY: 109 mg/dL — AB (ref 65–99)
GLUCOSE-CAPILLARY: 88 mg/dL (ref 65–99)
GLUCOSE-CAPILLARY: 99 mg/dL (ref 65–99)
Glucose-Capillary: 88 mg/dL (ref 65–99)
Glucose-Capillary: 150 mg/dL — ABNORMAL HIGH (ref 65–99)

## 2017-10-13 LAB — PROTIME-INR
INR: 2.24
Prothrombin Time: 24.6 seconds — ABNORMAL HIGH (ref 11.4–15.2)

## 2017-10-13 MED ORDER — ALBUTEROL SULFATE (2.5 MG/3ML) 0.083% IN NEBU: 2.5000 mg | INHALATION_SOLUTION | RESPIRATORY_TRACT | Status: DC | PRN

## 2017-10-13 MED ORDER — WARFARIN SODIUM 4 MG PO TABS
4.0000 mg | ORAL_TABLET | Freq: Every day | ORAL | Status: DC
  Administered 2017-10-13 – 2017-10-16 (×4): 4 mg via ORAL
  Filled 2017-10-13 (×4): qty 1

## 2017-10-13 MED ORDER — ENSURE ENLIVE PO LIQD
237.0000 mL | Freq: Two times a day (BID) | ORAL | Status: DC
  Administered 2017-10-13 – 2017-10-21 (×8): 237 mL via ORAL

## 2017-10-13 NOTE — Progress Notes (Signed)
[facility] Rounding Note  Subjective/Interval History:  HD 3/1, 3/4 for volume mostly Excellent UOP past 24 hours with lasix Creatinine up a little more at 4 Says he is breathing better, less DOE Still needing O2  Objective Vital signs in last 24 hours: Vitals:   10/13/17 0819 10/13/17 0820 10/13/17 1136 10/13/17 1303  BP: 115/75   (!) 104/52  Pulse: 90  61 65  Resp: 19  (!) 28 (!) 31  Temp: 98 F (36.7 C)   97.8 F (36.6 C)  TempSrc: Oral   Oral  SpO2: 96% 96% 93% 94%  Weight:      Height:       Weight change: 0.1 kg (3.5 oz)  Intake/Output Summary (Last 24 hours) at 10/13/2017 1721 Last data filed at 10/13/2017 1600 Gross per 24 hour  Intake 426 ml  Output 1200 ml  Net -774 ml   Physical Exam:  Blood pressure (!) 104/52, pulse 65, temperature 97.8 F (36.6 C), temperature source Oral, resp. rate (!) 31, height 5' (1.524 m), weight 75.3 kg (166 lb 0.1 oz), SpO2 94 %.  Pale, very pleasant Wearing O2 Ant coarse breath sounds Crackles at bases less prevalent Chest sternotomy scar R TDC catheter S1S2 NoS3 Abd soft not tender with pitting edema esp R abd wall LE pitting edema only trace Abrasions and scabs LE's   Recent Labs  Lab 10/08/17 0904 10/09/17 0725 10/10/17 0310 10/10/17 0726 10/11/17 0258 10/12/17 0205 10/13/17 0541  NA 136 136 136 136 137 137 136  K 4.8 5.2* 3.8 4.1 4.5 3.8 4.5  CL 101 100* 98* 99* 99* 100* 98*  CO2 23 27 28 29 29 27 27   GLUCOSE 83 72 111* 87 77 106* 83  BUN 18 25* 10 12 22* 30* 35*  CREATININE 3.32* 4.07* 2.21* 2.42* 3.11* 3.67* 4.01*  CALCIUM 8.7* 8.6* 8.2* 8.4* 8.6* 8.5* 8.6*  PHOS 3.1 4.0 3.2 3.6 4.4 5.4* 4.6    Recent Labs  Lab 10/11/17 0258 10/12/17 0205 10/13/17 0541  ALBUMIN 2.6* 2.5* 2.7*   Recent Labs  Lab 10/08/17 0904 10/09/17 1455 10/10/17 0310 10/13/17 0541  WBC 6.5 6.0 5.1 5.4  HGB 8.8* 8.0* 8.6* 9.7*  HCT 29.0* 25.2* 29.0* 31.2*  MCV 93.5 91.6 92.7 91.5  PLT 151 128* 127* 135*    Recent Labs  Lab  10/13/17 0011 10/13/17 0458 10/13/17 0829 10/13/17 1136 10/13/17 1634  GLUCAP 88 107* 99 109* 88    Iron/TIBC/Ferritin/ %Sat    Component Value Date/Time   IRON 17 (L) 09/30/2017 1435   TIBC 133 (L) 09/30/2017 1435   FERRITIN 87 09/30/2017 1435   IRONPCTSAT 13 (L) 09/30/2017 1435   IRONPCTSAT 8 (L) 12/13/2012 1554   Medications: . furosemide Stopped (10/13/17 1240)   . amiodarone  200 mg Oral BID  . atorvastatin  80 mg Oral Daily  . chlorhexidine  15 mL Mouth Rinse BID  . coumadin book   Does not apply Once  . darbepoetin (ARANESP) injection - NON-DIALYSIS  150 mcg Subcutaneous Q Wed-1800  . feeding supplement (ENSURE ENLIVE)  237 mL Oral BID BM  . hydrocerin   Topical BID  . insulin aspart  0-20 Units Subcutaneous TID WC  . insulin glargine  14 Units Subcutaneous QHS  . ipratropium-albuterol  3 mL Nebulization BID  . mouth rinse  15 mL Mouth Rinse q12n4p  . multivitamin  1 tablet Oral QHS  . pantoprazole  40 mg Oral Daily  . PARoxetine  40 mg  Oral Daily  . polyethylene glycol  17 g Oral Daily  . senna  1 tablet Oral BID  . senna-docusate  1 tablet Oral QHS  . warfarin  4 mg Oral q1800  . warfarin   Does not apply Once  . Warfarin - Pharmacist Dosing Inpatient   Does not apply q1800   Dg Chest 2 View Result Date: 10/10/2017 CLINICAL DATA:  History of pulmonary edema, end-stage renal disease, COPD EXAM: CHEST  2 VIEW COMPARISON:  Portable chest x-ray of 10/02/2016 and chest x-ray of 09/18/2017 FINDINGS: The lungs again are not optimally aerated and there are persistent changes of CHF with pulmonary vascular congestion, cardiomegaly, and small pleural effusions right greater than left. Right dialysis catheter tip overlies the region of the right atrium and cardiomegaly is stable. Median sternotomy sutures are unchanged. IMPRESSION: 1. Little change in CHF pattern with bilateral pleural effusions. 2. Suboptimal aeration. Electronically Signed   By: Ivar Drape M.D.   On:  10/10/2017 08:06   Background:  62 yo male, admitted 2/9 w/AF RVR, temp pacer required 2/2 long pauses w/hypotension, Flu A +, hypoxic resp failure (required vent support). AKI in this setting (ATN rel shock/sepsis vs vanco), CRRT 2/14 - 2/18. 1st HD 09/27/17 and requiring intermittently. Baseline creatinine appears to have been around 1.6-1.7 past year.   Assessment/Recommendations  1. AKI on CKD3 - shock/+/-vanco/sepsis related ATN. CRRT 2/14 - 2/18; HD has been required intermittently since 09/27/17. Now 3 weeks into AKI.  Making some but not great urine. HD 3/1 and 3/4 for volume mostly. Lasix now helping UOP, creatinine still rising in interdialytic interval but not rapidly and would like to see if will plateau. Continue lasix 160 Q12H/trend labs and exam. I cannot say at this time if long term HD will be needed or not.  2. PAF tachy/brady syndrome - amio/coumadin 3. S/p flu/PNA - post ATB's/tamiflu 4. Systolic HF - EF 45-62%. Despite volume removal with HD CXR 3/5 still with vascular congestion and pleural effusions.  5. Anemia on Aranesp 150 "QWed w/HD"  Last dosed 3/6. Had 2 doses Feraheme per pharmacy (2/23, 2/25) 6. DM per primary team 7. Dysphagia - D3 diet  Jamal Maes, MD [facility] [phone] pager 10/13/2017, 5:21 PM

## 2017-10-13 NOTE — Plan of Care (Signed)
  Progressing Clinical Measurements: Will remain free from infection 10/13/2017 0429 - Progressing by Peggye Pitt, RN   Not Progressing Health Behavior/Discharge Planning: Ability to manage health-related needs will improve 10/13/2017 0429 - Not Progressing by Peggye Pitt, RN Elimination: Will not experience complications related to bowel motility 10/13/2017 0429 - Not Progressing by Peggye Pitt, RN Will not experience complications related to urinary retention 10/13/2017 0429 - Not Progressing by Peggye Pitt, RN

## 2017-10-13 NOTE — Progress Notes (Signed)
  Speech Language Pathology Treatment: Dysphagia  Patient Details Name: BANDON SHERWIN MRN: [id number] DOB: 05-Feb-1956 Today's Date: 10/13/2017 Time: 0900-0908 SLP Time Calculation (min) (ACUTE ONLY): 8 min  Assessment / Plan / Recommendation Clinical Impression  Goal of session to assess appropriateness for repeat MBS prior to discharge. Previous MBS 3/1 with upgrade in solids and liquids. Oral care preceded thin water cup trials with delayed cough. Will proceed with MBS for possible upgrade with compensatory strategies scheduled for 9:30.   HPI HPI: 62 yo M with History of paroxysmal A. Fib, DM, CHF, CAD, CABG 2017, carotid stenosis (S/P L Carotid Endarterectomy), GERD, CVA, COPD, HTN, Abdominal Hernia, and Diabetes presented  generalized weakness and was found to have flu and was in A. Fib with RVR. Developed AKI requiring transient CRRT. Acute hypoxemic respiratory failure with bilateral pulmonary infiltrates. Intubated 2/12-2/21. CXR 2/22 increasing interstitial prominence, likely interstitial edema. Increasing bibasilar atelectasis. BSE 08/12/16 primary esophageal dysphagia. Esophagram same day no esophageal obstruction or stricture. Presbyesophagus. penetration of barium to the level of the vocal cords occurred during the exam and did elicit intermittent coughing. MBS 09/29/17 recommended D2, honey thick liquids. Repeat MBS recommended to assess diet/liquid upgrade.      SLP Plan  MBS       Recommendations  Diet recommendations: Dysphagia 3 (mechanical soft);Nectar-thick liquid Liquids provided via: Cup Medication Administration: Whole meds with puree Supervision: Patient able to self feed;Intermittent supervision to cue for compensatory strategies Compensations: Minimize environmental distractions;Slow rate;Small sips/bites;Clear throat intermittently Postural Changes and/or Swallow Maneuvers: Seated upright 90 degrees                Oral Care Recommendations: Oral care  BID Follow up Recommendations: Skilled Nursing facility SLP Visit Diagnosis: Dysphagia, pharyngeal phase (R13.13) Plan: MBS       GO                Houston Siren 10/13/2017, 9:23 AM  Orbie Pyo Colvin Caroli.Ed [employer] [phone]

## 2017-10-13 NOTE — Progress Notes (Signed)
Modified Barium Swallow Progress Note  Patient Details  Name: [name] MRN: [id number] Date of Birth: 27-Mar-1956  Today's Date: 10/13/2017  Modified Barium Swallow completed.  Full report located under Chart Review in the Imaging Section.  Brief recommendations include the following:  Clinical Impression  Pharyngeal swallow function has improved from 3/1 study, laryngeal penetation limited to flash with thin and contacting cords with thin via straw and exited vestibule. Timing of swallow functional with mildly incomplete epiglottic closure. Small, single cup sips consistently safe during this study. Pt cleared throat several times during study without barium viewed in vestibule or below vocal cords. No significant post swallow residue. Recommend continue Dys 3, upgrade to thin liquids, NO straws, small sips, pills crushed and upright position. Pt has decreased overall awareness of impairments.    Swallow Evaluation Recommendations       SLP Diet Recommendations: Dysphagia 3 (Mech soft) solids;Thin liquid   Liquid Administration via: Cup;No straw   Medication Administration: Crushed with puree   Supervision: Patient able to self feed;Intermittent supervision to cue for compensatory strategies   Compensations: Slow rate;Small sips/bites   Postural Changes: Seated upright at 90 degrees   Oral Care Recommendations: Oral care BID        Houston Siren 10/13/2017,11:06 AM

## 2017-10-13 NOTE — Plan of Care (Signed)
  Progressing Nutritional: Ability to make appropriate dietary choices will improve 10/13/2017 1444 - Progressing by Renee Rival, RN Respiratory: Respiratory symptoms related to disease process will be avoided 10/13/2017 1444 - Progressing by Renee Rival, RN

## 2017-10-13 NOTE — Care Management Important Message (Signed)
Important Message  Patient Details  Name: [name] MRN: [id number] Date of Birth: 25-Mar-1956   Medicare Important Message Given:  Yes    Donisha Hoch P Elsie 10/13/2017, 2:25 PM

## 2017-10-13 NOTE — Progress Notes (Signed)
Nutrition Follow-up  DOCUMENTATION CODES:   Obesity unspecified  INTERVENTION:    Ensure Enlive po BID, each supplement provides 350 kcal and 20 grams of protein  NUTRITION DIAGNOSIS:   Increased nutrient needs related to acute illness, wound healing as evidenced by estimated needs, ongoing   GOAL:   Patient will meet greater than or equal to 90% of their needs, progressing  MONITOR:   PO intake, Supplement acceptance, Labs, Weight trends, Skin  ASSESSMENT:   2/20 iHD started 2/21 Extubated 2/22 Diet advanced to Dysphagia II, Honey Thick 3/01 MBSS, diet advanced to Dys III, Nectar Thick  Pt s/p repeat MBSS today. Diet advanced to Dys 3 thin liquids. PO intake improved at 50-75% per flowsheet records. Continues on intermittent HD for volume overload. Labs and medications reviewed. Phos WNL. CBG's [id number].  Diet Order:  DIET DYS 3 Room service appropriate? Yes; Fluid consistency: Thin  EDUCATION NEEDS:   No education needs have been identified at this time  Skin:  Skin Assessment: Skin Integrity Issues: Skin Integrity Issues:: Other (Comment) DTI: sacrum Other: MASD: groin, buttocks, coccyx  Last BM:  3/6   Intake/Output Summary (Last 24 hours) at 10/13/2017 1548 Last data filed at 10/13/2017 0900 Gross per 24 hour  Intake 360 ml  Output 2200 ml  Net -1840 ml   Height:   Ht Readings from Last 1 Encounters:  09/25/17 5' (1.524 m)   Weight:   Wt Readings from Last 1 Encounters:  10/13/17 166 lb 0.1 oz (75.3 kg)   Ideal Body Weight:  48.1 kg  BMI:  Body mass index is 32.42 kg/m.  Estimated Nutritional Needs:   Kcal:  1900-2100  Protein:  100-115 gm  Fluid:  1000 ml plus UOP  Arthur Holms, RD, LDN Pager #: [phone] After-Hours Pager #: [phone]

## 2017-10-13 NOTE — Progress Notes (Signed)
ANTICOAGULATION CONSULT NOTE - Follow Up Consult  Pharmacy Consult for Coumadin Indication: atrial fibrillation  Allergies  Allergen Reactions  . Penicillins Hives, Nausea And Vomiting, Swelling and Other (See Comments)    Tolerated Cefepime Has patient had a PCN reaction causing immediate rash, facial/tongue/throat swelling, SOB or lightheadedness with hypotension: YES Has patient had a PCN reaction causing severe rash involving mucus membranes or skin necrosis: No Has patient had a PCN reaction that required hospitalization No Has patient had a PCN reaction occurring within the last 10 years: No If all of the above answers are "NO", then may proceed with Cephalosporin use.     Patient Measurements: Height: 5' (152.4 cm) Weight: 166 lb 0.1 oz (75.3 kg) IBW/kg (Calculated) : 50 Heparin Dosing Weight: 68.7 kg   Vital Signs: Temp: 98 F (36.7 C) (03/08 0819) Temp Source: Oral (03/08 0819) BP: 115/75 (03/08 0819) Pulse Rate: 90 (03/08 0819)  Labs: Recent Labs    10/11/17 0258 10/12/17 0205 10/13/17 0541  HGB  --   --  9.7*  HCT  --   --  31.2*  PLT  --   --  135*  LABPROT 23.5* 25.5* 24.6*  INR 2.11 2.35 2.24  CREATININE 3.11* 3.67* 4.01*    Estimated Creatinine Clearance: 16 mL/min (A) (by C-G formula based on SCr of 4.01 mg/dL (H)).  Assessment: 21 yom on Xarelto 15mg  PTA for Afib (on med rec), not addressed initially in H&P by teaching service and Cards note says pt is NOT on anticoag 2/2 GIB in past.   Patient remains therapeutic on warfarin for Afib (INR is 2.24 today).  Goal of Therapy:  INR 2-3 Monitor platelets by anticoagulation protocol: Yes   Plan:  - Warfarin 4 mg po daily - Daily INR - Monitor CBC, for s/sx bleeding, DDI with amiodarone  Thank you Anette Guarneri, PharmD [phone] 10/13/2017 10:09 AM

## 2017-10-13 NOTE — Progress Notes (Signed)
Subjective:  Patient seen laying comfortably in bed this AM in no acute distress. States his breathing was better overnight and didn't have SOB with exertion during the day while sitting in chair or while working with PT/OT. Patient notes continued improvement in swelling, noticing increased improvement in abdominal swelling since yesterday. No acute complaints today, other than wanting to go home.   Objective:  Vital signs in last 24 hours: Vitals:   10/12/17 1648 10/12/17 2006 10/12/17 2053 10/13/17 0452  BP: 135/66 (!) 122/95  (!) 127/93  Pulse: 98 99 86 64  Resp: 19 18 17 18   Temp: 97.9 F (36.6 C) 98.3 F (36.8 C)  98.2 F (36.8 C)  TempSrc: Oral Oral  Oral  SpO2: 94% 93% 93% 97%  Weight:    166 lb 0.1 oz (75.3 kg)  Height:       Physical Exam  Constitutional:  Appears older than stated age in no acute distress; 2L [place] in place upon arrival, weaned to room air during interview.  Cardiovascular: Normal rate, regular rhythm and intact distal pulses. Exam reveals no friction rub.  No murmur heard. Respiratory:  Patient laying comfortably in bed on 2L O2 upon arrival. Turned off supplemental O2 and patient remained saturating >90% on room air during interview. No accessory muscle use or nasal flaring. Speaking in full sentences without difficulty. Continued bibasilar crackles with decreased breath sounds overall at bilateral lung bases (R worse than L).   GI: Soft. He exhibits no distension (Subjective decrease in abdominal fullness compared to previous exams). There is no tenderness. There is no rebound.  Musculoskeletal: He exhibits edema (Trace sacral edema, slightly improved from yesterday's exam). He exhibits no tenderness (of bilateral lower extremities).  Skin: Skin is warm and dry. No rash noted. No erythema.   Assessment/Plan:  Principal Problem:   Acute respiratory failure with hypoxia (HCC) Active Problems:   DM (diabetes mellitus), type 2, uncontrolled ([place])  Essential hypertension   Paroxysmal atrial fibrillation with rapid ventricular response (HCC)   COPD (chronic obstructive pulmonary disease) (HCC)   Chronic combined systolic and diastolic CHF (congestive heart failure) (HCC)   Ischemic cardiomyopathy   Right bundle branch block (RBBB) on electrocardiography   Tachy-brady syndrome (HCC)   Sinus pause   Influenza with respiratory manifestation   Aspiration pneumonia of both lower lobes (HCC)   AKI (acute kidney injury) ([place])   Pressure injury of skin   Anemia of chronic disease  Acute on Chronic Renal Failure:On intermittent HD via tunneled dialysis cather placed 2/64for volume overload, latest session 10/09/2017. Cr 4.0 today, increased from 3.6 yesterday. Electrolyte abnormalities do not require dialysis at this time (K = 4.5 today), but nephrology currently assisting with volume status (lasix 160 mg BID). UOP = -3.1L yesterday. Patient subjectively continues to improve with no complaints of SOB with minimal exertion yesterday and decreased abdominal swelling.  -Nephrology consulting, recommendations appreciated regarding dialysis today/over weekend for volume overload  AcuteRespiratory Failure 2/2 recent pneumonia, influenza,and volume overload: Patient stably saturating >93% on room air last night and during interview. As stated above, patient continues to improve and states subjective improvement with exertion and at rest. Will speak to nursing staff about monitoring ambulatory O2 status.  -Continue monitoring respiratory status -Incentive spirometry and flutter valve -Patient is to be out of bed to chair throughout the day -Continue to wean supplemental oxygen (goal >90%) as tolerated  PAF with RVR, Tachy-Brady Syndrome: Patient in NSR earlier this AM but on reevaluation HR  intermittently in 100s. Will obtain EKG to ensure sinus rhythm and not reentry into A-Fib. Cardiology to follow up as an outpatient for pacemaker placement. -F/U  EKG -Anticoagulation with Warfarin 3-4 mg daily, last INR 2.24 today -Avoid metoprolol to prevent any bradycardia -Amiodarone 200mg  BID  DM: Current A1c on 09/28/17 was 7.9%. Recent CBGs appropriate.  -Continue current regimen of Lantus 14 units at bedtime withTID SSI at mealtime.  FEN/GI: -Dysphagia 2 diet with honey thick liquid per SLP  -No IVF, dialysis PRN for electrolyte abnormalities -Pantoprazole 40 mg daily  VTE Prophylaxis:Heparin TID Code Status: Full  Dispo: Anticipated discharge pending clinical improvement.  Thomasene Ripple, MD 10/13/2017, 7:45 AM Pager: [phone]

## 2017-10-14 LAB — RENAL FUNCTION PANEL
ANION GAP: 12 (ref 5–15)
Albumin: 2.7 g/dL — ABNORMAL LOW (ref 3.5–5.0)
BUN: 43 mg/dL — ABNORMAL HIGH (ref 6–20)
CALCIUM: 8.5 mg/dL — AB (ref 8.9–10.3)
CHLORIDE: 94 mmol/L — AB (ref 101–111)
CO2: 28 mmol/L (ref 22–32)
Creatinine, Ser: 4.24 mg/dL — ABNORMAL HIGH (ref 0.61–1.24)
GFR calc non Af Amer: 14 mL/min — ABNORMAL LOW (ref >60)
GFR, EST AFRICAN AMERICAN: 16 mL/min — AB (ref >60)
Glucose, Bld: 102 mg/dL — ABNORMAL HIGH (ref 65–99)
POTASSIUM: 4.8 mmol/L (ref 3.5–5.1)
Phosphorus: 4.4 mg/dL (ref 2.5–4.6)
Sodium: 134 mmol/L — ABNORMAL LOW (ref 135–145)

## 2017-10-14 LAB — GLUCOSE, CAPILLARY
GLUCOSE-CAPILLARY: 99 mg/dL (ref 65–99)
GLUCOSE-CAPILLARY: 73 mg/dL (ref 65–99)
Glucose-Capillary: 120 mg/dL — ABNORMAL HIGH (ref 65–99)
Glucose-Capillary: 121 mg/dL — ABNORMAL HIGH (ref 65–99)
Glucose-Capillary: 69 mg/dL (ref 65–99)
Glucose-Capillary: 96 mg/dL (ref 65–99)

## 2017-10-14 LAB — PROTIME-INR
INR: 2.35
Prothrombin Time: 25.5 seconds — ABNORMAL HIGH (ref 11.4–15.2)

## 2017-10-14 NOTE — Progress Notes (Signed)
Subjective:  Patient seen laying comfortably in bed this AM in no acute distress. Patient continues to endorse improvement in has breathing. He notes daily productive cough that he states improves when he gets up and moves around his room. Patient complains of 4 episodes of loose stools overnight. NO other acute complaints.  Objective:  Vital signs in last 24 hours: Vitals:   10/13/17 1941 10/13/17 2056 10/14/17 0409 10/14/17 0943  BP: 118/63  (!) 147/69   Pulse: 61  (!) 58   Resp: 17  16   Temp: 98.2 F (36.8 C)  98.1 F (36.7 C)   TempSrc: Axillary  Oral   SpO2: 95% 95% 94% 98%  Weight:   168 lb 14 oz (76.6 kg)   Height:       Physical Exam  Constitutional:  Appears older than stated age in no acute distress without [place] in place.  Cardiovascular: Normal rate, regular rhythm and intact distal pulses. Exam reveals no friction rub.  No murmur heard. Respiratory:  Turned off supplemental O2 upon arrival. >90% on room air during interview. No accessory muscle use or nasal flaring. Speaking in full sentences without difficulty. Continued bibasilar crackles with decreased breath sounds overall at bilateral lung bases, stable from previous exams.  GI: Soft. He exhibits no distension (Abdominal distension improved from yesterday's exam). There is no tenderness. There is no rebound.  Musculoskeletal: He exhibits edema (Trace sacral edema, stable from previous exams). He exhibits no tenderness (of bilateral lower extremities).  Skin: Skin is warm and dry. No rash noted. No erythema.  Psychiatric: He has a normal mood and affect. His behavior is normal.   Assessment/Plan:  Principal Problem:   Acute respiratory failure with hypoxia (HCC) Active Problems:   DM (diabetes mellitus), type 2, uncontrolled (HCC)   Essential hypertension   Paroxysmal atrial fibrillation with rapid ventricular response (HCC)   COPD (chronic obstructive pulmonary disease) (HCC)   Chronic combined systolic and  diastolic CHF (congestive heart failure) (HCC)   Ischemic cardiomyopathy   Right bundle branch block (RBBB) on electrocardiography   Tachy-brady syndrome (HCC)   Sinus pause   Influenza with respiratory manifestation   Aspiration pneumonia of both lower lobes (HCC)   AKI (acute kidney injury) ([place])   Pressure injury of skin   Anemia of chronic disease  Acute on Chronic Renal Failure:On intermittent HD via tunneled dialysis cather placed 2/38for volume overload, latest session 10/09/2017. Cr 4.2 today, increased from 4.0 yesterday. K+ remains stable and without need for emergent dialysis (4.8 today from 4.5 yesterday). Patient had -1.7L UOP with lasix 160 mg BID yesterday.  -Nephrology consulting, recommendations appreciated   AcuteRespiratory Failure 2/2 recent pneumonia, influenza,and volume overload: Patient stably saturating >90% on room air today. Will continue with monitoring and IS to help prevent atelectasis. Patient will remain out of bed as tolerated during the day to assist with atelectasis prevention.  -Incentive spirometry and flutter valve -Patient is to be out of bed to chair throughout the day -Wean supplemental oxygen to room air (goal >90%)   PAF with RVR, Tachy-Brady Syndrome: Patient in NSR earlier this AM. Cardiology to follow up as an outpatient for pacemaker placement. -Warfarin per pharmacy, last INR 2.35  -Amiodarone 200mg  BID -Avoid metoprolol to prevent any bradycardia  DM: Current A1c on 09/28/17 was 7.9%. Recent CBGs appropriate.  -Continue current regimen of Lantus 14 units qhs and SSI TID with meals  FEN/GI: -Dysphagia 3 diet per SLP  -No IVF, dialysis PRN  for electrolyte abnormalities -Pantoprazole 40 mg daily  VTE Prophylaxis:Heparin TID Code Status: Full  Dispo: Anticipated discharge pending clinical improvement.  Thomasene Ripple, MD 10/14/2017, 11:57 AM Pager: [phone]

## 2017-10-14 NOTE — Plan of Care (Signed)
  Progressing Clinical Measurements: Will remain free from infection 10/14/2017 0245 - Progressing by Peggye Pitt, RN Nutrition: Adequate nutrition will be maintained 10/14/2017 0245 - Progressing by Peggye Pitt, RN   Not Progressing Health Behavior/Discharge Planning: Ability to manage health-related needs will improve 10/14/2017 0245 - Not Progressing by Peggye Pitt, RN Clinical Measurements: Respiratory complications will improve 10/14/2017 0245 - Not Progressing by Peggye Pitt, RN Elimination: Will not experience complications related to bowel motility 10/14/2017 0245 - Not Progressing by Peggye Pitt, RN

## 2017-10-14 NOTE — Plan of Care (Signed)
  Clinical Measurements: Cardiovascular complication will be avoided 10/14/2017 2046 - Progressing by Irish Lack, RN   Clinical Measurements: Respiratory complications will improve 10/14/2017 2046 - Progressing by Irish Lack, RN

## 2017-10-14 NOTE — Progress Notes (Signed)
[facility] Rounding Note  Subjective/Interval History:  Last HD 3/4 (for volume mostly) Good UOP with lasix Neg balance but weight ^ Creatinine up a little more at 4.2 Breathing better  Objective Vital signs in last 24 hours: Vitals:   10/13/17 1941 10/13/17 2056 10/14/17 0409 10/14/17 0943  BP: 118/63  (!) 147/69   Pulse: 61  (!) 58   Resp: 17  16   Temp: 98.2 F (36.8 C)  98.1 F (36.7 C)   TempSrc: Axillary  Oral   SpO2: 95% 95% 94% 98%  Weight:   76.6 kg (168 lb 14 oz)   Height:       Weight change: 1.3 kg (2 lb 13.9 oz)  Intake/Output Summary (Last 24 hours) at 10/14/2017 1219 Last data filed at 10/14/2017 0800 Gross per 24 hour  Intake 612 ml  Output 1725 ml  Net -1113 ml   Physical Exam:  Blood pressure (!) 147/69, pulse (!) 58, temperature 98.1 F (36.7 C), temperature source Oral, resp. rate 16, height 5' (1.524 m), weight 76.6 kg (168 lb 14 oz), SpO2 98 %.  Pale, very pleasant Wearing O2 Ant coarse breath sounds Crackles at bases less prevalent Chest sternotomy scar R TDC catheter with dressing (placed 2/21) S1S2 NoS3 Abd soft not tender with pitting edema esp R abd wall LE pitting edema only trace Abrasions and scabs LE's   Recent Labs  Lab 10/09/17 0725 10/10/17 0310 10/10/17 0726 10/11/17 0258 10/12/17 0205 10/13/17 0541 10/14/17 0902  NA 136 136 136 137 137 136 134*  K 5.2* 3.8 4.1 4.5 3.8 4.5 4.8  CL 100* 98* 99* 99* 100* 98* 94*  CO2 27 28 29 29 27 27 28   GLUCOSE 72 111* 87 77 106* 83 102*  BUN 25* 10 12 22* 30* 35* 43*  CREATININE 4.07* 2.21* 2.42* 3.11* 3.67* 4.01* 4.24*  CALCIUM 8.6* 8.2* 8.4* 8.6* 8.5* 8.6* 8.5*  PHOS 4.0 3.2 3.6 4.4 5.4* 4.6 4.4    Recent Labs  Lab 10/12/17 0205 10/13/17 0541 10/14/17 0902  ALBUMIN 2.5* 2.7* 2.7*   Recent Labs  Lab 10/08/17 0904 10/09/17 1455 10/10/17 0310 10/13/17 0541  WBC 6.5 6.0 5.1 5.4  HGB 8.8* 8.0* 8.6* 9.7*  HCT 29.0* 25.2* 29.0* 31.2*  MCV 93.5 91.6 92.7 91.5  PLT 151 128* 127*  135*    Recent Labs  Lab 10/13/17 1944 10/14/17 0033 10/14/17 0414 10/14/17 0804 10/14/17 1204  GLUCAP 150* 121* 99 73 69        Component Value Date/Time   IRON 17 (L) 09/30/2017 1435   TIBC 133 (L) 09/30/2017 1435   FERRITIN 87 09/30/2017 1435   IRONPCTSAT 13 (L) 09/30/2017 1435   IRONPCTSAT 8 (L) 12/13/2012 1554   Medications: . furosemide Stopped (10/14/17 1033)   . amiodarone  200 mg Oral BID  . atorvastatin  80 mg Oral Daily  . chlorhexidine  15 mL Mouth Rinse BID  . coumadin book   Does not apply Once  . darbepoetin (ARANESP) injection - NON-DIALYSIS  150 mcg Subcutaneous Q Wed-1800  . feeding supplement (ENSURE ENLIVE)  237 mL Oral BID BM  . hydrocerin   Topical BID  . insulin aspart  0-20 Units Subcutaneous TID WC  . insulin glargine  14 Units Subcutaneous QHS  . ipratropium-albuterol  3 mL Nebulization BID  . mouth rinse  15 mL Mouth Rinse q12n4p  . multivitamin  1 tablet Oral QHS  . pantoprazole  40 mg Oral Daily  . PARoxetine  40 mg Oral Daily  . polyethylene glycol  17 g Oral Daily  . senna  1 tablet Oral BID  . senna-docusate  1 tablet Oral QHS  . warfarin  4 mg Oral q1800  . warfarin   Does not apply Once  . Warfarin - Pharmacist Dosing Inpatient   Does not apply q1800   Dg Chest 2 View Result Date: 10/10/2017 CLINICAL DATA:  History of pulmonary edema, end-stage renal disease, COPD EXAM: CHEST  2 VIEW COMPARISON:  Portable chest x-ray of 10/02/2016 and chest x-ray of 09/18/2017 FINDINGS: The lungs again are not optimally aerated and there are persistent changes of CHF with pulmonary vascular congestion, cardiomegaly, and small pleural effusions right greater than left. Right dialysis catheter tip overlies the region of the right atrium and cardiomegaly is stable. Median sternotomy sutures are unchanged. IMPRESSION: 1. Little change in CHF pattern with bilateral pleural effusions. 2. Suboptimal aeration. Electronically Signed   By: Ivar Drape M.D.   On:  10/10/2017 08:06   Background:  62 yo male, admitted 2/9 w/AF RVR, temp pacer required 2/2 long pauses w/hypotension, Flu A +, hypoxic resp failure (required vent support). AKI in this setting (ATN rel shock/sepsis vs vanco), CRRT 2/14 - 2/18. IHD since  09/27/17 and requiring intermittently. Baseline creatinine appears to have been around 1.6-1.7 past year.   Assessment/Recommendations  1. AKI on CKD3 - shock/+/-vanco/sepsis related ATN. CRRT 2/14 - 2/18; HD has been required intermittently since 09/27/17. Now 3+ weeks into AKI.  HD 3/1 and 3/4 for volume mostly. Lasix now helping UOP, creatinine still rising but not rapidly and would like to see if will plateau. Continue lasix 160 Q12H/trend labs and exam. I still cannot say at this time if long term HD will be needed or not. 2. PAF tachy/brady syndrome - amio/coumadin 3. S/p flu/PNA - post ATB's/tamiflu 4. Systolic HF - EF 40-98%. Despite volume removal with HD CXR 3/5 still with vascular congestion and pleural effusions. F/U xray AM since diuresing w/lasix 5. Anemia on Aranesp 150 QWeek Last dosed 3/6. Had 2 doses Feraheme per pharmacy (2/23, 2/25) 6. DM per primary team 7. Dysphagia - D3 diet  Jamal Maes, MD [facility] [phone] pager 10/14/2017, 12:19 PM

## 2017-10-14 NOTE — Progress Notes (Signed)
ANTICOAGULATION CONSULT NOTE - Follow Up Consult  Pharmacy Consult for Coumadin Indication: atrial fibrillation  Allergies  Allergen Reactions  . Penicillins Hives, Nausea And Vomiting, Swelling and Other (See Comments)    Tolerated Cefepime Has patient had a PCN reaction causing immediate rash, facial/tongue/throat swelling, SOB or lightheadedness with hypotension: YES Has patient had a PCN reaction causing severe rash involving mucus membranes or skin necrosis: No Has patient had a PCN reaction that required hospitalization No Has patient had a PCN reaction occurring within the last 10 years: No If all of the above answers are "NO", then may proceed with Cephalosporin use.     Patient Measurements: Height: 5' (152.4 cm) Weight: 168 lb 14 oz (76.6 kg) IBW/kg (Calculated) : 50 Heparin Dosing Weight: 68.7 kg   Vital Signs: Temp: 98.1 F (36.7 C) (03/09 0409) Temp Source: Oral (03/09 0409) BP: 147/69 (03/09 0409) Pulse Rate: 58 (03/09 0409)  Labs: Recent Labs    10/12/17 0205 10/13/17 0541 10/14/17 0902  HGB  --  9.7*  --   HCT  --  31.2*  --   PLT  --  135*  --   LABPROT 25.5* 24.6* 25.5*  INR 2.35 2.24 2.35  CREATININE 3.67* 4.01* 4.24*    Estimated Creatinine Clearance: 15.3 mL/min (A) (by C-G formula based on SCr of 4.24 mg/dL (H)).  Assessment: 84 yom on Xarelto 15mg  PTA for Afib (on med rec), not addressed initially in H&P by teaching service and Cards note says pt is NOT on anticoag 2/2 GIB in past.   Patient remains therapeutic on warfarin for Afib (INR is 2.35 today).  Goal of Therapy:  INR 2-3 Monitor platelets by anticoagulation protocol: Yes   Plan:  - Warfarin 4 mg po daily - Daily INR - Monitor CBC, for s/sx bleeding, DDI with amiodarone  Thank you Anette Guarneri, PharmD [phone] 10/14/2017 11:34 AM

## 2017-10-15 ENCOUNTER — Inpatient Hospital Stay (HOSPITAL_COMMUNITY): Payer: Medicare (Managed Care)

## 2017-10-15 DIAGNOSIS — R110 Nausea: Secondary | ICD-10-CM

## 2017-10-15 LAB — RENAL FUNCTION PANEL
Albumin: 2.9 g/dL — ABNORMAL LOW (ref 3.5–5.0)
Anion gap: 11 (ref 5–15)
BUN: 50 mg/dL — AB (ref 6–20)
CHLORIDE: 92 mmol/L — AB (ref 101–111)
CO2: 28 mmol/L (ref 22–32)
CREATININE: 4.48 mg/dL — AB (ref 0.61–1.24)
Calcium: 8.9 mg/dL (ref 8.9–10.3)
GFR calc Af Amer: 15 mL/min — ABNORMAL LOW (ref >60)
GFR calc non Af Amer: 13 mL/min — ABNORMAL LOW (ref >60)
GLUCOSE: 101 mg/dL — AB (ref 65–99)
Phosphorus: 4.5 mg/dL (ref 2.5–4.6)
Potassium: 5.3 mmol/L — ABNORMAL HIGH (ref 3.5–5.1)
Sodium: 131 mmol/L — ABNORMAL LOW (ref 135–145)

## 2017-10-15 LAB — GLUCOSE, CAPILLARY
GLUCOSE-CAPILLARY: 100 mg/dL — AB (ref 65–99)
GLUCOSE-CAPILLARY: 127 mg/dL — AB (ref 65–99)
GLUCOSE-CAPILLARY: 131 mg/dL — AB (ref 65–99)
GLUCOSE-CAPILLARY: 134 mg/dL — AB (ref 65–99)
Glucose-Capillary: 126 mg/dL — ABNORMAL HIGH (ref 65–99)
Glucose-Capillary: 100 mg/dL — ABNORMAL HIGH (ref 65–99)

## 2017-10-15 LAB — PROTIME-INR
INR: 2.29
PROTHROMBIN TIME: 25 s — AB (ref 11.4–15.2)

## 2017-10-15 NOTE — Progress Notes (Signed)
MD on call paged that patient has CBG's Q4 but insulin schedule is AC asked to clarify how often to check CBG. Arthor Captain LPN

## 2017-10-15 NOTE — Progress Notes (Signed)
[facility] Rounding Note  Background:  62 yo male, admitted 2/9 w/AF RVR, temp pacer required 2/2 long pauses w/hypotension, Flu A +, hypoxic resp failure (required vent support). AKI in this setting (ATN rel shock/sepsis vs vanco), CRRT 2/14 - 2/18. IHD off/onn since 09/27/17. Baseline creatinine appears to have been around 1.6-1.7 past year.   Assessment/Recommendations 1. AKI on CKD3 - shock/+/-vanco/sepsis related ATN. CRRT 2/14 - 2/18; HD has been required intermittently since 09/27/17. Now nearly 4 weeks into AKI.  Last HD 3/4 for volume mostly. Lasix helping UOP, creatinine still rising, CXR and pt still wet. Continue lasix 160 Q12H/trend labs and exam. I still cannot say at this time if long term HD will be needed or not (looking like it might) I have written for HD for tomorrow.  2. PAF tachy/brady syndrome - amio/coumadin 3. S/p flu/PNA - post ATB's/tamiflu 4. Systolic HF - EF 14-78%. Today's xray not much changed. Pt about same as well. Needs more volume off. 5. Anemia on Aranesp 150 QWeek Last dosed 3/6. Had 2 doses Feraheme per pharmacy (2/23, 2/25) 6. DM per primary team 7. Dysphagia - D3 diet  Jamal Maes, MD [facility] [phone] pager 10/15/2017, 3:22 PM  Subjective/Interval History:  Last HD 3/4 (for volume mostly) Making urine with lasix but weight NOT down, CXR today not much improved Creatinine up a little more at close to 4.5  Objective Vital signs in last 24 hours: Vitals:   10/14/17 2034 10/15/17 0425 10/15/17 1054 10/15/17 1305  BP:  (!) 109/56  121/68  Pulse:    (!) 113  Resp:  20  18  Temp:  98.6 F (37 C)  97.9 F (36.6 C)  TempSrc:  Oral  Oral  SpO2: 95% 95% 97% 96%  Weight:  76.3 kg (168 lb 3.4 oz)    Height:       Weight change: -0.3 kg (-10.6 oz)  Intake/Output Summary (Last 24 hours) at 10/15/2017 1522 Last data filed at 10/15/2017 1309 Gross per 24 hour  Intake 1845 ml  Output 3325 ml  Net -1480 ml   Physical Exam:  Blood pressure  121/68, pulse (!) 113, temperature 97.9 F (36.6 C), temperature source Oral, resp. rate 18, height 5' (1.524 m), weight 76.3 kg (168 lb 3.4 oz), SpO2 96 %.  Pale, very pleasant Wearing O2 Ant coarse breath sounds Crackles at bases persistent Chest sternotomy scar R TDC catheter with dressing (placed 2/21) S1S2 NoS3 Abd soft not tender with pitting edema esp R abd wall LE pitting edema only trace Abrasions and scabs LE's   Recent Labs  Lab 10/10/17 0310 10/10/17 0726 10/11/17 0258 10/12/17 0205 10/13/17 0541 10/14/17 0902 10/15/17 0606  NA 136 136 137 137 136 134* 131*  K 3.8 4.1 4.5 3.8 4.5 4.8 5.3*  CL 98* 99* 99* 100* 98* 94* 92*  CO2 28 29 29 27 27 28 28   GLUCOSE 111* 87 77 106* 83 102* 101*  BUN 10 12 22* 30* 35* 43* 50*  CREATININE 2.21* 2.42* 3.11* 3.67* 4.01* 4.24* 4.48*  CALCIUM 8.2* 8.4* 8.6* 8.5* 8.6* 8.5* 8.9  PHOS 3.2 3.6 4.4 5.4* 4.6 4.4 4.5    Recent Labs  Lab 10/13/17 0541 10/14/17 0902 10/15/17 0606  ALBUMIN 2.7* 2.7* 2.9*   Recent Labs  Lab 10/09/17 1455 10/10/17 0310 10/13/17 0541  WBC 6.0 5.1 5.4  HGB 8.0* 8.6* 9.7*  HCT 25.2* 29.0* 31.2*  MCV 91.6 92.7 91.5  PLT 128* 127* 135*  Recent Labs  Lab 10/14/17 1952 10/15/17 0013 10/15/17 0325 10/15/17 0743 10/15/17 1137  GLUCAP 120* 131* 126* 100* 100*        Component Value Date/Time   IRON 17 (L) 09/30/2017 1435   TIBC 133 (L) 09/30/2017 1435   FERRITIN 87 09/30/2017 1435   IRONPCTSAT 13 (L) 09/30/2017 1435   IRONPCTSAT 8 (L) 12/13/2012 1554   Medications: . furosemide Stopped (10/15/17 1308)   . amiodarone  200 mg Oral BID  . atorvastatin  80 mg Oral Daily  . chlorhexidine  15 mL Mouth Rinse BID  . coumadin book   Does not apply Once  . darbepoetin (ARANESP) injection - NON-DIALYSIS  150 mcg Subcutaneous Q Wed-1800  . feeding supplement (ENSURE ENLIVE)  237 mL Oral BID BM  . hydrocerin   Topical BID  . insulin aspart  0-20 Units Subcutaneous TID WC  . insulin  glargine  14 Units Subcutaneous QHS  . ipratropium-albuterol  3 mL Nebulization BID  . mouth rinse  15 mL Mouth Rinse q12n4p  . multivitamin  1 tablet Oral QHS  . pantoprazole  40 mg Oral Daily  . PARoxetine  40 mg Oral Daily  . polyethylene glycol  17 g Oral Daily  . senna  1 tablet Oral BID  . senna-docusate  1 tablet Oral QHS  . warfarin  4 mg Oral q1800  . warfarin   Does not apply Once  . Warfarin - Pharmacist Dosing Inpatient   Does not apply [phone]

## 2017-10-15 NOTE — Progress Notes (Signed)
   Subjective:  Patient endorses nausea this morning and some yesterday however without vomiting. He states last BM was yesterday. Per patient he was still able to eat all meals yesterday and wants to continue trying to eat his breakfast. He endorses stable breathing, denies fever, chills.  Objective:  Vital signs in last 24 hours: Vitals:   10/14/17 1345 10/14/17 1949 10/14/17 2034 10/15/17 0425  BP: (!) 122/57 (!) 135/55  (!) 109/56  Pulse: (!) 59 (!) 59    Resp: 18 18  20   Temp: 97.7 F (36.5 C) 97.8 F (36.6 C)  98.6 F (37 C)  TempSrc: Oral Oral  Oral  SpO2: 97% 94% 95% 95%  Weight:    168 lb 3.4 oz (76.3 kg)  Height:       Constitutional: NAD, sitting up in bed with breakfast, short episode of retching w/o emesis CV: RRR, pitting edema to buttocks Resp: no increased work of breathing, no O2 requirement, mild left basilar rales Abd: +BS, NDNT  Assessment/Plan:  Principal Problem:   Acute respiratory failure with hypoxia (HCC) Active Problems:   DM (diabetes mellitus), type 2, uncontrolled (HCC)   Essential hypertension   Paroxysmal atrial fibrillation with rapid ventricular response (HCC)   COPD (chronic obstructive pulmonary disease) (HCC)   Chronic combined systolic and diastolic CHF (congestive heart failure) (HCC)   Ischemic cardiomyopathy   Right bundle branch block (RBBB) on electrocardiography   Tachy-brady syndrome (HCC)   Sinus pause   Influenza with respiratory manifestation   Aspiration pneumonia of both lower lobes (HCC)   AKI (acute kidney injury) ([place])   Pressure injury of skin   Anemia of chronic disease  AKI on CKD: Still on intermittent HD via tunneled cath (right upper chest); last few sessions were mainly for volume overload, however he has also responded well to lasix. Had net 1.5L out yesterday. On exam today he is still volume overloaded; renal function reveals hyperK to 5.3, hyponatremia, uptrending BUN to 50, and Cr to 4.48; Cr trend is  overall slowing down, however with rest of renal function panel he may need HD today. We are still working with nephrology to establish whether he would need long-term dialysis, in which case permanent access will need to be obtained, or whether he would have enough renal recovery to get off of HD. --nephro following, appreciate their input; may need HD again today or tomorrow --Currently still on lasix BID  Acute respiratory failure 2/2 pneumonia, influenza, and volume overload: Resolved at rest, he no longer requires supplemental oxygen at rest. --continue incentive spirometry and flutter valve for prevention of atelectasis --patient out of bed to chair throughout day --PT/OT to continue working with him   PAF with RVR, Tachy-brady syndrome: Patient in SR with 1st degree AV block. Will follow up with cards for outpatient pacer placement. --Warfarin per pharmacy; therapeutic --amiodarone 200mg  BID --avoid metoprolol and other BB to prevent bradycardia and pauses  T2DM: --continue Lantus 14 units qhs and SSI TID wc  FEN/GI: Dysphagia 3 diet per SLP - appreciate their recs No IVF, dialysis PRN for electrolyte abnormalities PPI   VTE proph: warfarin Code: full  Dispo: Anticipated discharge in approximately 7-14 day(s); it is still unclear whether he will definitely need long-term dialysis vs possibility for renal recovery; dispo to SNF.   Alphonzo Grieve, MD 10/15/2017, 8:34 AM IMTS - PGY2 Pager [phone]

## 2017-10-15 NOTE — Progress Notes (Signed)
Paged doctor in formed her the pt. Heart rate had changed to a-fib and was ranging for the low 100'S to the one twenties she ordered a EKG with no norther changes in his orders

## 2017-10-15 NOTE — Progress Notes (Signed)
ANTICOAGULATION CONSULT NOTE - Follow Up Consult  Pharmacy Consult for Coumadin Indication: atrial fibrillation  Allergies  Allergen Reactions  . Penicillins Hives, Nausea And Vomiting, Swelling and Other (See Comments)    Tolerated Cefepime Has patient had a PCN reaction causing immediate rash, facial/tongue/throat swelling, SOB or lightheadedness with hypotension: YES Has patient had a PCN reaction causing severe rash involving mucus membranes or skin necrosis: No Has patient had a PCN reaction that required hospitalization No Has patient had a PCN reaction occurring within the last 10 years: No If all of the above answers are "NO", then may proceed with Cephalosporin use.     Patient Measurements: Height: 5' (152.4 cm) Weight: 168 lb 3.4 oz (76.3 kg) IBW/kg (Calculated) : 50 Heparin Dosing Weight: 68.7 kg   Vital Signs: Temp: 98.6 F (37 C) (03/10 0425) Temp Source: Oral (03/10 0425) BP: 109/56 (03/10 0425)  Labs: Recent Labs    10/13/17 0541 10/14/17 0902 10/15/17 0606  HGB 9.7*  --   --   HCT 31.2*  --   --   PLT 135*  --   --   LABPROT 24.6* 25.5* 25.0*  INR 2.24 2.35 2.29  CREATININE 4.01* 4.24* 4.48*    Estimated Creatinine Clearance: 14.4 mL/min (A) (by C-G formula based on SCr of 4.48 mg/dL (H)).  Assessment: 35 yom on Xarelto 15mg  PTA for Afib (on med rec), not addressed initially in H&P by teaching service and Cards note says pt is NOT on anticoag 2/2 GIB in past.   Patient remains therapeutic on warfarin for Afib  Goal of Therapy:  INR 2-3 Monitor platelets by anticoagulation protocol: Yes   Plan:  - Warfarin 4 mg po daily - Daily INR - Monitor CBC, for s/sx bleeding, DDI with amiodarone  Thank you Anette Guarneri, PharmD [phone] 10/15/2017 11:32 AM

## 2017-10-16 LAB — CBC
HCT: 31.1 % — ABNORMAL LOW (ref 39.0–52.0)
HEMOGLOBIN: 9.7 g/dL — AB (ref 13.0–17.0)
MCH: 28.2 pg (ref 26.0–34.0)
MCHC: 31.2 g/dL (ref 30.0–36.0)
MCV: 90.4 fL (ref 78.0–100.0)
Platelets: 204 10*3/uL (ref 150–400)
RBC: 3.44 MIL/uL — AB (ref 4.22–5.81)
RDW: 19.6 % — ABNORMAL HIGH (ref 11.5–15.5)
WBC: 5.9 10*3/uL (ref 4.0–10.5)

## 2017-10-16 LAB — GLUCOSE, CAPILLARY
GLUCOSE-CAPILLARY: 129 mg/dL — AB (ref 65–99)
GLUCOSE-CAPILLARY: 94 mg/dL (ref 65–99)
GLUCOSE-CAPILLARY: 117 mg/dL — AB (ref 65–99)
Glucose-Capillary: 97 mg/dL (ref 65–99)

## 2017-10-16 LAB — RENAL FUNCTION PANEL
ALBUMIN: 2.7 g/dL — AB (ref 3.5–5.0)
ANION GAP: 15 (ref 5–15)
BUN: 55 mg/dL — ABNORMAL HIGH (ref 6–20)
CALCIUM: 8.7 mg/dL — AB (ref 8.9–10.3)
CO2: 26 mmol/L (ref 22–32)
Chloride: 92 mmol/L — ABNORMAL LOW (ref 101–111)
Creatinine, Ser: 4.53 mg/dL — ABNORMAL HIGH (ref 0.61–1.24)
GFR calc non Af Amer: 13 mL/min — ABNORMAL LOW (ref >60)
GFR, EST AFRICAN AMERICAN: 15 mL/min — AB (ref >60)
Glucose, Bld: 138 mg/dL — ABNORMAL HIGH (ref 65–99)
Phosphorus: 5.7 mg/dL — ABNORMAL HIGH (ref 2.5–4.6)
Potassium: 4.7 mmol/L (ref 3.5–5.1)
SODIUM: 133 mmol/L — AB (ref 135–145)

## 2017-10-16 LAB — PROTIME-INR
INR: 2.31
Prothrombin Time: 25.2 seconds — ABNORMAL HIGH (ref 11.4–15.2)

## 2017-10-16 NOTE — Progress Notes (Signed)
[facility] Rounding Note  Background:  62 yo male, admitted 2/9 w/AF RVR, temp pacer required 2/2 long pauses w/hypotension, Flu A +, hypoxic resp failure (required vent support). AKI in this setting (ATN rel shock/sepsis vs vanco), CRRT 2/14 - 2/18. IHD off/onn since 09/27/17. Baseline creatinine appears to have been around 1.6-1.7 past year.   Assessment/Recommendations 1. AKI on CKD3 - shock/+/-vanco/sepsis related ATN. CRRT 2/14 - 2/18; HD has been required intermittently since 09/27/17. Now nearly 4 weeks into AKI.  Last HD 3/4 for volume mostly. Lasix helping UOP, creatinine still rising, CXR and pt still wet. Continue lasix 160 Q12H/trend labs and exam. I still cannot say at this time if long term HD will be needed or not (looking like it might) - needs HD today  2. PAF tachy/brady syndrome - amio/coumadin 3. S/p flu/PNA - post ATB's/tamiflu 4. Systolic HF - EF 16-10%. Today's xray not much changed. Pt about same as well. Needs more volume off. 5. Anemia on Aranesp 150 QWeek Last dosed 3/6. Had 2 doses Feraheme per pharmacy (2/23, 2/25) 6. DM per primary team 7. Dysphagia - D3 diet  [name] A 10/15/2017, 3:22 PM  Subjective/Interval History:  Last HD 3/4 (for volume mostly) Making urine with lasix (2.5 liters)  but weight NOT down, CXR today not much improved- says is nauseated today  Creatinine up a little more at now 4.5- also mild hyperkalemia and hyponatremia so I agree that he needs HD support  Objective Vital signs in last 24 hours: Vitals:   10/15/17 1944 10/15/17 2107 10/16/17 0603 10/16/17 0859  BP: 107/84  108/89   Pulse:      Resp: 20  19   Temp: 97.7 F (36.5 C)  97.8 F (36.6 C)   TempSrc: Oral  Oral   SpO2: 96% 96% 98% 95%  Weight:   74.5 kg (164 lb 3.9 oz)   Height:       Weight change: -1.8 kg (-15.5 oz)  Intake/Output Summary (Last 24 hours) at 10/16/2017 1120 Last data filed at 10/16/2017 0730 Gross per 24 hour  Intake 720 ml  Output 1876 ml  Net  -1156 ml   Physical Exam:  Blood pressure 108/89, pulse (!) 113, temperature 97.8 F (36.6 C), temperature source Oral, resp. rate 19, height 5' (1.524 m), weight 74.5 kg (164 lb 3.9 oz), SpO2 95 %.  Pale, very pleasant Wearing O2 Ant coarse breath sounds Crackles at bases persistent Chest sternotomy scar R TDC catheter with dressing (placed 2/21) S1S2 NoS3 Abd soft not tender with pitting edema esp R abd wall LE pitting edema only trace Abrasions and scabs LE's   Recent Labs  Lab 10/10/17 0726 10/11/17 0258 10/12/17 0205 10/13/17 0541 10/14/17 0902 10/15/17 0606 10/16/17 0325  NA 136 137 137 136 134* 131* 133*  K 4.1 4.5 3.8 4.5 4.8 5.3* 4.7  CL 99* 99* 100* 98* 94* 92* 92*  CO2 29 29 27 27 28 28 26   GLUCOSE 87 77 106* 83 102* 101* 138*  BUN 12 22* 30* 35* 43* 50* 55*  CREATININE 2.42* 3.11* 3.67* 4.01* 4.24* 4.48* 4.53*  CALCIUM 8.4* 8.6* 8.5* 8.6* 8.5* 8.9 8.7*  PHOS 3.6 4.4 5.4* 4.6 4.4 4.5 5.7*    Recent Labs  Lab 10/14/17 0902 10/15/17 0606 10/16/17 0325  ALBUMIN 2.7* 2.9* 2.7*   Recent Labs  Lab 10/09/17 1455 10/10/17 0310 10/13/17 0541 10/16/17 0325  WBC 6.0 5.1 5.4 5.9  HGB 8.0* 8.6* 9.7* 9.7*  HCT 25.2* 29.0*  31.2* 31.1*  MCV 91.6 92.7 91.5 90.4  PLT 128* 127* 135* 204    Recent Labs  Lab 10/15/17 0743 10/15/17 1137 10/15/17 1644 10/15/17 2119 10/16/17 0601  GLUCAP 100* 100* 127* 134* 129*        Component Value Date/Time   IRON 17 (L) 09/30/2017 1435   TIBC 133 (L) 09/30/2017 1435   FERRITIN 87 09/30/2017 1435   IRONPCTSAT 13 (L) 09/30/2017 1435   IRONPCTSAT 8 (L) 12/13/2012 1554   Medications: . furosemide 160 mg (10/16/17 0937)   . amiodarone  200 mg Oral BID  . atorvastatin  80 mg Oral Daily  . chlorhexidine  15 mL Mouth Rinse BID  . coumadin book   Does not apply Once  . darbepoetin (ARANESP) injection - NON-DIALYSIS  150 mcg Subcutaneous Q Wed-1800  . feeding supplement (ENSURE ENLIVE)  237 mL Oral BID BM  .  hydrocerin   Topical BID  . insulin aspart  0-20 Units Subcutaneous TID WC  . insulin glargine  14 Units Subcutaneous QHS  . ipratropium-albuterol  3 mL Nebulization BID  . mouth rinse  15 mL Mouth Rinse q12n4p  . multivitamin  1 tablet Oral QHS  . pantoprazole  40 mg Oral Daily  . PARoxetine  40 mg Oral Daily  . polyethylene glycol  17 g Oral Daily  . senna  1 tablet Oral BID  . senna-docusate  1 tablet Oral QHS  . warfarin  4 mg Oral q1800  . warfarin   Does not apply Once  . Warfarin - Pharmacist Dosing Inpatient   Does not apply [phone]

## 2017-10-16 NOTE — Progress Notes (Signed)
ANTICOAGULATION CONSULT NOTE - Follow Up Consult  Pharmacy Consult for Coumadin Indication: atrial fibrillation  Allergies  Allergen Reactions  . Penicillins Hives, Nausea And Vomiting, Swelling and Other (See Comments)    Tolerated Cefepime Has patient had a PCN reaction causing immediate rash, facial/tongue/throat swelling, SOB or lightheadedness with hypotension: YES Has patient had a PCN reaction causing severe rash involving mucus membranes or skin necrosis: No Has patient had a PCN reaction that required hospitalization No Has patient had a PCN reaction occurring within the last 10 years: No If all of the above answers are "NO", then may proceed with Cephalosporin use.    Patient Measurements: Height: 5' (152.4 cm) Weight: 164 lb 3.9 oz (74.5 kg) IBW/kg (Calculated) : 50 Heparin Dosing Weight: 68.7 kg   Vital Signs: Temp: 97.8 F (36.6 C) (03/11 0603) Temp Source: Oral (03/11 0603) BP: 108/89 (03/11 0603)  Labs: Recent Labs    10/14/17 0902 10/15/17 0606 10/16/17 0325  HGB  --   --  9.7*  HCT  --   --  31.1*  PLT  --   --  204  LABPROT 25.5* 25.0* 25.2*  INR 2.35 2.29 2.31  CREATININE 4.24* 4.48* 4.53*   Estimated Creatinine Clearance: 14.1 mL/min (A) (by C-G formula based on SCr of 4.53 mg/dL (H)).  Assessment: 57 yom on Xarelto 15mg  PTA for Afib (on med rec), not addressed initially in H&P by teaching service and Cards note says pt is NOT on anticoag 2/2 GIB in past.   INR 2.31 remains therapeutic on warfarin for Afib; CBC stable, no overt bleeding documented  Goal of Therapy:  INR 2-3 Monitor platelets by anticoagulation protocol: Yes   Plan:  - Warfarin 4 mg po daily - Daily INR - Monitor CBC, for s/sx bleeding, DDI with amiodarone  Georga Bora, PharmD Clinical Pharmacist 10/16/2017 9:46 AM

## 2017-10-16 NOTE — Progress Notes (Signed)
Subjective:  Patient seen sitting comfortably in bed this AM. Patient states he feels sick to his stomach today and wasn't able to eat much for breakfast as a result. No vomiting. He thinks the medicine helps a little bit but doesn't take it away completely. He has intermittent palpitations, no chest pain or SOB. No other acute complaints.  Objective:  Vital signs in last 24 hours: Vitals:   10/15/17 1944 10/15/17 2107 10/16/17 0603 10/16/17 0859  BP: 107/84  108/89   Pulse:      Resp: 20  19   Temp: 97.7 F (36.5 C)  97.8 F (36.6 C)   TempSrc: Oral  Oral   SpO2: 96% 96% 98% 95%  Weight:   164 lb 3.9 oz (74.5 kg)   Height:       Physical Exam  Constitutional:  Appears older than stated age in no acute distress  Cardiovascular: Normal rate, regular rhythm and intact distal pulses. Exam reveals no friction rub.  No murmur heard. Respiratory:  No [place]. No accessory muscle use or nasal flaring. Speaking in full sentences. Decreased breath sounds at R and L lung bases, intermittent soft crackles. Lungs clear in apical, anterior lung fields bilaterally.  GI: Soft. Bowel sounds are normal. He exhibits no distension. There is no tenderness. There is no rebound.  Musculoskeletal: He exhibits edema (Trace sacral edema, stable). He exhibits no tenderness (of bilateral lower extremities).  Skin: Skin is warm and dry. No rash noted. No erythema.   Assessment/Plan:  Principal Problem:   Acute respiratory failure with hypoxia (HCC) Active Problems:   DM (diabetes mellitus), type 2, uncontrolled ([place])   Essential hypertension   Paroxysmal atrial fibrillation with rapid ventricular response (HCC)   COPD (chronic obstructive pulmonary disease) (HCC)   Chronic combined systolic and diastolic CHF (congestive heart failure) (HCC)   Ischemic cardiomyopathy   Right bundle branch block (RBBB) on electrocardiography   Tachy-brady syndrome (HCC)   Sinus pause   Influenza with respiratory  manifestation   Aspiration pneumonia of both lower lobes (HCC)   AKI (acute kidney injury) ([place])   Pressure injury of skin   Anemia of chronic disease  Acute on Chronic Renal Failure:On intermittent HD via tunneled dialysis cather placed on 2/25for volume overload, latest session 10/09/2017. Cr 4.53 today, stable from 4.48 yesterday. No emergent electrolyte abnormalities that require dialysis (K = 4.7 today), but BUN risen to 55. This may have something to do with the patient's endorsement of ongoing nausea. Patient continuing on lasix 160 mg BID with increased UOP (net -2.0L yesterday) and continued subjective improvement in swelling/SOB on daily exams. Patient states he will be going to dialysis today, I'm hopeful there will be some improvement in nausea and Cr after dialysis to indicate ongoing renal recovery. Will continue to clinically monitor. -Nephrology consulting, recommendations appreciated  -Zofran PRN for nausea  AcuteRespiratory Failure 2/2 recent pneumonia, influenza,and volume overload: Patient stably saturating >93% on room air. Will continue to encourage pulmonary hygiene and out of bed as tolerated.  -Continue monitoring respiratory status -Incentive spirometry and flutter valve  PAF with RVR, Tachy-Brady Syndrome: Patient in tachycardic today with intermittent palpitations. Patient's EKG yesterday showed A-Fib with RVR, but appeared to be in sinus tachycardia on interview per cardiac monitor and physical exam. No symptomatic hypotension, chest pain, SOB. Will continue to clinically monitor and consider increasing amiodarone as needed. Cardiology consulted earlier in admission, patient to follow up as outpatient for pacemaker placement.  -Continue telemetry -  Continue amiodarone 200mg  BID and Warfarin per pharmacy -Avoid metoprolol to prevent any bradycardia given history of tachy-brady syndrome  DM: Current A1c on 09/28/17 was 7.9%. Recent CBGs continue to be appropriate and at  goal. -Continue current regimen of Lantus 14 units at bedtime withTID SSI at mealtime.  FEN/GI: -Dysphagia 3 diet -No IVF, dialysis PRN for electrolyte abnormalities -Pantoprazole 40 mg daily  VTE Prophylaxis:Heparin TID Code Status: Full  Dispo: Anticipated discharge pending renal improvement.  Thomasene Ripple, MD 10/16/2017, 10:27 AM Pager: [phone]

## 2017-10-16 NOTE — Consult Note (Signed)
[facility] Nurse wound follow up Wound type:sacral/gluteal cleft pressure injury. 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  Stage 2 at proximal location. Measurement:  Distal wound (Stage 3) measures 1cm x 1.2cm x 0.2cm Proximal Wound (Stage 2) measures 3cm x 1.6cm x 0.1cm Wound bed:Both wounds with clean, pink wound bed, scant serous exudate. Drainage (amount, consistency, odor) As described above Periwound: Intact with evidence of previous wound healing Dressing procedure/placement/frequency: Patient has been incontinence of soft light brown stool. Perianal area cleansed with skin cleanser and moisture barrier ointment applied.  Soft silicone foam dressing placed over sacrum, ensuring that it fits down into gluteal cleft.  Patient is in the supine and high Fowler's position to eat his lunch, he is reminded to lie on his side following lunch to redistribution pressure.   Bilateral heel boots for pressure redistribution to the heels are provided today.  [facility] nursing team will follow weekly while in house, and will remain available to this patient, the nursing and medical teams.  Please re-consult if needed in between visits. Thanks, Maudie Flakes, MSN, RN, [place], [facility]  Pager# [phone]

## 2017-10-17 DIAGNOSIS — E11622 Type 2 diabetes mellitus with other skin ulcer: Secondary | ICD-10-CM

## 2017-10-17 LAB — RENAL FUNCTION PANEL
Albumin: 2.8 g/dL — ABNORMAL LOW (ref 3.5–5.0)
Anion gap: 14 (ref 5–15)
BUN: 61 mg/dL — AB (ref 6–20)
CALCIUM: 8.7 mg/dL — AB (ref 8.9–10.3)
CHLORIDE: 90 mmol/L — AB (ref 101–111)
CO2: 28 mmol/L (ref 22–32)
CREATININE: 4.65 mg/dL — AB (ref 0.61–1.24)
GFR calc non Af Amer: 12 mL/min — ABNORMAL LOW (ref >60)
GFR, EST AFRICAN AMERICAN: 14 mL/min — AB (ref >60)
GLUCOSE: 95 mg/dL (ref 65–99)
Phosphorus: 5.9 mg/dL — ABNORMAL HIGH (ref 2.5–4.6)
Potassium: 4.7 mmol/L (ref 3.5–5.1)
SODIUM: 132 mmol/L — AB (ref 135–145)

## 2017-10-17 LAB — GLUCOSE, CAPILLARY
GLUCOSE-CAPILLARY: 95 mg/dL (ref 65–99)
GLUCOSE-CAPILLARY: 97 mg/dL (ref 65–99)
Glucose-Capillary: 77 mg/dL (ref 65–99)

## 2017-10-17 LAB — PROTIME-INR
INR: 2.66
PROTHROMBIN TIME: 28.1 s — AB (ref 11.4–15.2)

## 2017-10-17 LAB — CBC
HCT: 30.6 % — ABNORMAL LOW (ref 39.0–52.0)
Hemoglobin: 9.3 g/dL — ABNORMAL LOW (ref 13.0–17.0)
MCH: 27.7 pg (ref 26.0–34.0)
MCHC: 30.4 g/dL (ref 30.0–36.0)
MCV: 91.1 fL (ref 78.0–100.0)
PLATELETS: 235 10*3/uL (ref 150–400)
RBC: 3.36 MIL/uL — AB (ref 4.22–5.81)
RDW: 19.6 % — AB (ref 11.5–15.5)
WBC: 4.9 10*3/uL (ref 4.0–10.5)

## 2017-10-17 MED ORDER — HEPARIN SODIUM (PORCINE) 1000 UNIT/ML DIALYSIS
1000.0000 [IU] | INTRAMUSCULAR | Status: DC | PRN
  Administered 2017-10-17: 1000 [IU] via INTRAVENOUS_CENTRAL
  Filled 2017-10-17 (×2): qty 1

## 2017-10-17 MED ORDER — WARFARIN SODIUM 3 MG PO TABS
3.0000 mg | ORAL_TABLET | Freq: Once | ORAL | Status: AC
  Administered 2017-10-17: 3 mg via ORAL
  Filled 2017-10-17: qty 1

## 2017-10-17 MED ORDER — FUROSEMIDE 10 MG/ML IJ SOLN
80.0000 mg | Freq: Two times a day (BID) | INTRAMUSCULAR | Status: DC
  Administered 2017-10-17 – 2017-10-19 (×5): 80 mg via INTRAVENOUS
  Filled 2017-10-17 (×5): qty 8

## 2017-10-17 MED ORDER — MIDODRINE HCL 5 MG PO TABS
10.0000 mg | ORAL_TABLET | Freq: Once | ORAL | Status: AC
  Administered 2017-10-17: 10 mg via ORAL
  Filled 2017-10-17: qty 2

## 2017-10-17 NOTE — Progress Notes (Addendum)
Physical Therapy Treatment Patient Details Name: [name] MRN: [id number] DOB: Apr 02, 1956 Today's Date: 10/17/2017    History of Present Illness Pt is a 62 y.o. male admitted 09/15/17 with generalized weakness, cough, and dizziness; found to be back in a-fib with rapid rate. S/p temporary pacemaker placement 2/8; electrophysiology avoiding PPM implant due to pt's co-morbidities. Intubated 2/12-2/21. Also with acute on chronic renal failure; s/p R IJ tunnelled dialysis cath placement 10/02/17. PMH includes CHF, CAD (s/p CABG 2017), carotid stenosis (s/p L carotid endarterectomy), CVA, COPD, HTN, DM, intellectual disability.     PT Comments    Pt performed transfer from bed to chair with limited change in assistance.  Pt remains reliant on external assistance to support him in standing.  Plan for SNF remains appropriate at this time as he is not safe to return home in his current functional state.  Plan next session for standing trials in stedy with advancement to pre-gt activities as patient is able to tolerate.      Follow Up Recommendations  SNF;Supervision/Assistance - 24 hour     Equipment Recommendations  None recommended by PT    Recommendations for Other Services       Precautions / Restrictions Precautions Precautions: Fall Precaution Comments: s/p RIJ tunnelled cath 2/25 Restrictions Weight Bearing Restrictions: No    Mobility  Bed Mobility Overal bed mobility: Needs Assistance Bed Mobility: Supine to Sit     Supine to sit: Mod assist     General bed mobility comments: Pt performed supine to sit and able to advance LEs to edge of bed un assisted.  To elevate trunk he uses PTA as a railing and able to elevate his trunk with +1 moderate assistance.  Pt requires encouragement to perform with out unecessary assistance.    Transfers Overall transfer level: Needs assistance Equipment used: B HHA Transfers: Squat Pivot Transfers Sit to Stand: Max assist;+2 physical  assistance         General transfer comment: Cues for hand placement to and from.  Pt able to stand with flexed posture and then required increased time to turn and sit.    Ambulation/Gait Ambulation/Gait assistance: (Shuffle steps with B HHA with little to no foot clearance.  )               Stairs            Wheelchair Mobility    Modified Rankin (Stroke Patients Only)       Balance Overall balance assessment: Needs assistance   Sitting balance-Leahy Scale: Fair       Standing balance-Leahy Scale: Poor                              Cognition Arousal/Alertness: Awake/alert Behavior During Therapy: WFL for tasks assessed/performed Overall Cognitive Status: History of cognitive impairments - at baseline                                        Exercises      General Comments        Pertinent Vitals/Pain Pain Assessment: No/denies pain    Home Living                      Prior Function            PT Goals (current goals  can now be found in the care plan section) Acute Rehab PT Goals Patient Stated Goal: Get stronger Potential to Achieve Goals: Good Progress towards PT goals: Progressing toward goals    Frequency    Min 2X/week      PT Plan Current plan remains appropriate    Co-evaluation              AM-PAC PT "6 Clicks" Daily Activity  Outcome Measure  Difficulty turning over in bed (including adjusting bedclothes, sheets and blankets)?: Unable Difficulty moving from lying on back to sitting on the side of the bed? : Unable Difficulty sitting down on and standing up from a chair with arms (e.g., wheelchair, bedside commode, etc,.)?: Unable Help needed moving to and from a bed to chair (including a wheelchair)?: A Lot Help needed walking in hospital room?: Total Help needed climbing 3-5 steps with a railing? : Total 6 Click Score: 7    End of Session Equipment Utilized During  Treatment: Gait belt Activity Tolerance: Patient tolerated treatment well Patient left: in bed;with call bell/phone within reach Nurse Communication: Mobility status PT Visit Diagnosis: Other abnormalities of gait and mobility (R26.89);Muscle weakness (generalized) (M62.81);Difficulty in walking, not elsewhere classified (R26.2)     Time: 4315-4008 PT Time Calculation (min) (ACUTE ONLY): 12 min  Charges:  $Therapeutic Activity: 8-22 mins                    G Codes:       [name], PTA pager [phone]    [name] 10/17/2017, 5:48 PM

## 2017-10-17 NOTE — Progress Notes (Signed)
Subjective:  Patient states that he feels well today. Had dialysis early in the AM and states he thinks that went well, denies dizziness and headaches during and after dialysis. Patient notes that the nausea he had yesterday has improved this AM (possibly related to dialysis). He states that he was able to eat breakfast this AM without difficulty. Denies any other acute complaints.  Objective:  Vital signs in last 24 hours: Vitals:   10/17/17 0535 10/17/17 0542 10/17/17 0635 10/17/17 0637  BP: (!) 126/59 122/60 122/60   Pulse: (!) 52 (!) 52 (!) 53 (!) 53  Resp: (!) 25 (!) 26 13 20   Temp:  98.5 F (36.9 C) 97.7 F (36.5 C)   TempSrc:  Oral Axillary   SpO2: 100% 100% 100% 95%  Weight:  162 lb 7.7 oz (73.7 kg)    Height:       Physical Exam  Constitutional:  Appears older than stated age sitting comfortably in bed in no acute distress  Cardiovascular: Normal rate, regular rhythm and intact distal pulses. Exam reveals no friction rub.  No murmur heard. Respiratory:  No [place]. No accessory muscle use or nasal flaring. Speaking in full sentences. Decreased breath sounds at lung bases bilaterally, with intermittent crackles, slightly improved from yesterday's exam  GI: Soft. Bowel sounds are normal. He exhibits no distension. There is no tenderness. There is no rebound.  Musculoskeletal: He exhibits no edema or tenderness (of bilateral lower extremities).  Skin: Skin is warm and dry. No rash noted. No erythema.   Assessment/Plan:  Principal Problem:   Acute respiratory failure with hypoxia (HCC) Active Problems:   DM (diabetes mellitus), type 2, uncontrolled ([place])   Essential hypertension   Paroxysmal atrial fibrillation with rapid ventricular response (HCC)   COPD (chronic obstructive pulmonary disease) (HCC)   Chronic combined systolic and diastolic CHF (congestive heart failure) (HCC)   Ischemic cardiomyopathy   Right bundle branch block (RBBB) on electrocardiography  Tachy-brady syndrome (HCC)   Sinus pause   Influenza with respiratory manifestation   Aspiration pneumonia of both lower lobes (HCC)   AKI (acute kidney injury) ([place])   Pressure injury of skin   Anemia of chronic disease  Acute on Chronic Renal Failure:On intermittent HD via tunneled dialysis cather placed on 2/25for volume overload, last session yesterday (8 day interval between last two dialysis sessions). Cr 4.65 this AM after dialysis, but was cut short 2/2 low BP during the session per chart review, so this value not accurate. Patient had -3.5 mL UOP recorded yesterday (net -2.4L yesterday) with IV diuresis. Volume overload improving, as patient reports subjective improvement in breathing and abdominal distension. Patient also more euvolemic on clinical exam, without sacral edema but with ongoing minimal lung crackles/abdominal distension. Will decrease lasix today per nephrology as a result of clinical improvement in volume status. Suspect patient will need to continue on dialysis as outpatient, but still hopeful for renal recovery. Currently awaiting recommendations from nephrology regarding further dialysis sessions and will assist with planning as needed.  -Nephrology consulting, recommendations appreciated  -Repeat Renal panel in AM -Lasix 80 mg BID  AcuteRespiratory Failure 2/2 recent pneumonia, influenza,and volume overload: Infectiouns resolved. Ongoing volume overload as above. Patient stably saturating >95% on room air. Will continue to encourage pulmonary hygiene and out of bed as tolerated to prevent atelectasis, recurrent pneumonia during hospitalization.  -Continue monitoring respiratory status -Incentive spirometry and flutter valve  PAF with RVR, Tachy-Brady Syndrome: Patients heart rate between 50-80 for past  24 hours. No symptomatic hypotension. Cardiology consulted earlier in admission, patient to follow up as outpatient for pacemaker placement.  -Continue  telemetry -Continue amiodarone 200mg  BID and Warfarin per pharmacy (last INR = 2.66) -Avoid metoprolol to prevent any bradycardia given history of tachy-brady syndrome  DM: Current A1c on 09/28/17 was 7.9%. Recent CBGs continue to be appropriate and at goal. -Continue current regimen of Lantus 14 units at bedtime withTID SSI at mealtime.  Sacral pressure ulcer, stage 2-3: Patient evaluated by wound care yesterday, who reports sacral ulcer (stage 2-3). Patient tolerated dressing change without difficultly.  -Continue daily wound care as recommended by wound care nurse  FEN/GI: -Dysphagia 3 diet -No IVF, electrolytes wnl -Pantoprazole 40 mg daily  VTE Prophylaxis:Heparin TID Code Status: Full  Dispo: Anticipated discharge pending renal improvement.  Thomasene Ripple, MD 10/17/2017, 6:54 AM Pager: [phone]

## 2017-10-17 NOTE — Progress Notes (Signed)
[facility] for Coumadin Indication: atrial fibrillation  Allergies  Allergen Reactions  . Penicillins Hives, Nausea And Vomiting, Swelling and Other (See Comments)    Tolerated Cefepime Has patient had a PCN reaction causing immediate rash, facial/tongue/throat swelling, SOB or lightheadedness with hypotension: YES Has patient had a PCN reaction causing severe rash involving mucus membranes or skin necrosis: No Has patient had a PCN reaction that required hospitalization No Has patient had a PCN reaction occurring within the last 10 years: No If all of the above answers are "NO", then may proceed with Cephalosporin use.    Patient Measurements: Height: 5' (152.4 cm) Weight: 162 lb 7.7 oz (73.7 kg) IBW/kg (Calculated) : 50 Heparin Dosing Weight: 68.7 kg   Vital Signs: Temp: 97.7 F (36.5 C) (03/12 0635) Temp Source: Axillary (03/12 0635) BP: 122/60 (03/12 0635) Pulse Rate: 53 (03/12 0637)  Labs: Recent Labs    10/15/17 0606 10/16/17 0325 10/17/17 0243  HGB  --  9.7* 9.3*  HCT  --  31.1* 30.6*  PLT  --  204 235  LABPROT 25.0* 25.2* 28.1*  INR 2.29 2.31 2.66  CREATININE 4.48* 4.53* 4.65*   Estimated Creatinine Clearance: 13.7 mL/min (A) (by C-G formula based on SCr of 4.65 mg/dL (H)).  Assessment: 20 yom on Xarelto 15mg  PTA for Afib (on med rec), not addressed initially in H&P by teaching service and Cards note says pt is NOT on anticoag 2/2 GIB in past.   INR up 2.31>2.66 - remains therapeutic on warfarin for Afib; CBC stable, no overt bleeding documented. Will reduce dose today and continue to monitor.  Goal of Therapy:  INR 2-3 Monitor platelets by anticoagulation protocol: Yes   Plan:  - Warfarin 3mg  PO x1 - Daily INR - Monitor CBC, for s/sx bleeding, DDI with amiodarone  Georga Bora, PharmD Clinical Pharmacist 10/17/2017 10:31 AM

## 2017-10-17 NOTE — Progress Notes (Signed)
HD tx ended @ 0535, 40 min early @ Dr. Justin Mend order d/t bp issues throughout tx, pt was totally asymptomatic throughout tx, UF goal not met, blood rinsed back, VSS after rinse back, report called to Oneal Grout, RN

## 2017-10-17 NOTE — Progress Notes (Signed)
HD tx initiated via HD cath w/o problem, pull/push/flush equally w/o problem, VSS, will cont to monitor 

## 2017-10-17 NOTE — Progress Notes (Signed)
[facility] Rounding Note  Background:  62 yo male, admitted 2/9 w/AF RVR, temp pacer required 2/2 long pauses w/hypotension, Flu A +, hypoxic resp failure (required vent support). AKI in this setting (ATN rel shock/sepsis vs vanco), CRRT 2/14 - 2/18. IHD off/onn since 09/27/17. Baseline creatinine appears to have been around 1.6-1.7 past year.   Assessment/Recommendations 1. AKI on CKD3 - shock/+/-vanco/sepsis related ATN. CRRT 2/14 - 2/18; HD has been required intermittently since 09/27/17. Now nearly 4 weeks into AKI.  Last HD 3/4 for volume mostly. Lasix helping UOP,  lasix 160 Q12H/trend labs and exam. I still cannot say at this time if long term HD will be needed or not (looking like it might) - needed HD early this AM for clearance- had to be stopped early due to low BP- do not have true post labs yet.  Foley catheter in place for nearly a month- urology said keep in until pt can stand and void on his own- he cannot do this- keep in for now  2. PAF tachy/brady syndrome - amio/coumadin 3. S/p flu/PNA - post ATB's/tamiflu 4. Systolic HF - EF 19-37%. Making great urine- actually will decrease lasix  5. Anemia on Aranesp 150 QWeek Last dosed 3/6. Had 2 doses Feraheme per pharmacy (2/23, 2/25) 6. DM per primary team 7. Dysphagia - D3 diet  Hazely Sealey A 10/15/2017, 3:22 PM  Subjective/Interval History:  HD 3/4 (for volume mostly)- then did again early 3/12 for clearance- feels better after Making urine with lasix (3.5 liters) says is nauseated today  Now continue to follow UOP and crt   Objective Vital signs in last 24 hours: Vitals:   10/17/17 0535 10/17/17 0542 10/17/17 0635 10/17/17 0637  BP: (!) 126/59 122/60 122/60   Pulse: (!) 52 (!) 52 (!) 53 (!) 53  Resp: (!) 25 (!) 26 13 20   Temp:  98.5 F (36.9 C) 97.7 F (36.5 C)   TempSrc:  Oral Axillary   SpO2: 100% 100% 100% 95%  Weight:  73.7 kg (162 lb 7.7 oz)    Height:       Weight change: -0.8 kg (-12.2 oz)  Intake/Output  Summary (Last 24 hours) at 10/17/2017 1005 Last data filed at 10/17/2017 9024 Gross per 24 hour  Intake 786 ml  Output 2748 ml  Net -1962 ml   Physical Exam:  Blood pressure 122/60, pulse (!) 53, temperature 97.7 F (36.5 C), temperature source Axillary, resp. rate 20, height 5' (1.524 m), weight 73.7 kg (162 lb 7.7 oz), SpO2 95 %.  Pale, very pleasant Wearing O2 Ant coarse breath sounds Crackles at bases persistent Chest sternotomy scar R TDC catheter with dressing (placed 2/21) S1S2 NoS3 Abd soft not tender with pitting edema esp R abd wall LE pitting edema only trace Abrasions and scabs LE's   Recent Labs  Lab 10/11/17 0258 10/12/17 0205 10/13/17 0541 10/14/17 0902 10/15/17 0606 10/16/17 0325 10/17/17 0243  NA 137 137 136 134* 131* 133* 132*  K 4.5 3.8 4.5 4.8 5.3* 4.7 4.7  CL 99* 100* 98* 94* 92* 92* 90*  CO2 29 27 27 28 28 26 28   GLUCOSE 77 106* 83 102* 101* 138* 95  BUN 22* 30* 35* 43* 50* 55* 61*  CREATININE 3.11* 3.67* 4.01* 4.24* 4.48* 4.53* 4.65*  CALCIUM 8.6* 8.5* 8.6* 8.5* 8.9 8.7* 8.7*  PHOS 4.4 5.4* 4.6 4.4 4.5 5.7* 5.9*    Recent Labs  Lab 10/15/17 0606 10/16/17 0325 10/17/17 0243  ALBUMIN 2.9* 2.7* 2.8*  Recent Labs  Lab 10/13/17 0541 10/16/17 0325 10/17/17 0243  WBC 5.4 5.9 4.9  HGB 9.7* 9.7* 9.3*  HCT 31.2* 31.1* 30.6*  MCV 91.5 90.4 91.1  PLT 135* 204 235    Recent Labs  Lab 10/16/17 0601 10/16/17 1153 10/16/17 1632 10/16/17 2122 10/17/17 0636  GLUCAP 129* 94 117* 97 77        Component Value Date/Time   IRON 17 (L) 09/30/2017 1435   TIBC 133 (L) 09/30/2017 1435   FERRITIN 87 09/30/2017 1435   IRONPCTSAT 13 (L) 09/30/2017 1435   IRONPCTSAT 8 (L) 12/13/2012 1554   Medications: . furosemide 160 mg (10/17/17 0932)   . amiodarone  200 mg Oral BID  . atorvastatin  80 mg Oral Daily  . chlorhexidine  15 mL Mouth Rinse BID  . coumadin book   Does not apply Once  . darbepoetin (ARANESP) injection - NON-DIALYSIS  150 mcg  Subcutaneous Q Wed-1800  . feeding supplement (ENSURE ENLIVE)  237 mL Oral BID BM  . hydrocerin   Topical BID  . insulin aspart  0-20 Units Subcutaneous TID WC  . insulin glargine  14 Units Subcutaneous QHS  . mouth rinse  15 mL Mouth Rinse q12n4p  . multivitamin  1 tablet Oral QHS  . pantoprazole  40 mg Oral Daily  . PARoxetine  40 mg Oral Daily  . polyethylene glycol  17 g Oral Daily  . senna  1 tablet Oral BID  . senna-docusate  1 tablet Oral QHS  . warfarin  4 mg Oral q1800  . warfarin   Does not apply Once  . Warfarin - Pharmacist Dosing Inpatient   Does not apply [phone]

## 2017-10-18 LAB — RENAL FUNCTION PANEL
Albumin: 2.7 g/dL — ABNORMAL LOW (ref 3.5–5.0)
Anion gap: 10 (ref 5–15)
BUN: 34 mg/dL — AB (ref 6–20)
CO2: 28 mmol/L (ref 22–32)
Calcium: 8.4 mg/dL — ABNORMAL LOW (ref 8.9–10.3)
Chloride: 95 mmol/L — ABNORMAL LOW (ref 101–111)
Creatinine, Ser: 3.14 mg/dL — ABNORMAL HIGH (ref 0.61–1.24)
GFR calc Af Amer: 23 mL/min — ABNORMAL LOW (ref >60)
GFR, EST NON AFRICAN AMERICAN: 20 mL/min — AB (ref >60)
GLUCOSE: 80 mg/dL (ref 65–99)
PHOSPHORUS: 3.8 mg/dL (ref 2.5–4.6)
POTASSIUM: 4.4 mmol/L (ref 3.5–5.1)
Sodium: 133 mmol/L — ABNORMAL LOW (ref 135–145)

## 2017-10-18 LAB — GLUCOSE, CAPILLARY
GLUCOSE-CAPILLARY: 117 mg/dL — AB (ref 65–99)
GLUCOSE-CAPILLARY: 72 mg/dL (ref 65–99)
GLUCOSE-CAPILLARY: 108 mg/dL — AB (ref 65–99)
GLUCOSE-CAPILLARY: 129 mg/dL — AB (ref 65–99)
Glucose-Capillary: 66 mg/dL (ref 65–99)

## 2017-10-18 LAB — PROTIME-INR
INR: 2.71
Prothrombin Time: 28.6 s — ABNORMAL HIGH (ref 11.4–15.2)

## 2017-10-18 LAB — PARATHYROID HORMONE, INTACT (NO CA): PTH: 100 pg/mL — ABNORMAL HIGH (ref 15–65)

## 2017-10-18 MED ORDER — WARFARIN SODIUM 3 MG PO TABS
3.0000 mg | ORAL_TABLET | Freq: Once | ORAL | Status: AC
  Administered 2017-10-18: 3 mg via ORAL
  Filled 2017-10-18: qty 1

## 2017-10-18 NOTE — Progress Notes (Signed)
  Speech Language Pathology Treatment: Dysphagia  Patient Details Name: [name] MRN: [id number] DOB: 1955/11/26 Today's Date: 10/18/2017 Time: 2811-8867 SLP Time Calculation (min) (ACUTE ONLY): 11 min  Assessment / Plan / Recommendation Clinical Impression  Pt followed up since upgraded to thin liquids after repeat MBS. Pt denies difficulty. No s/s aspiration with Dys 3 texture, thin liquids vial cup. Lung sounds recorded by RN have been stable. CXR 3/10 showed improved CHF. He reports he can masticate D 3 texture without difficulty. Will continue D3 due to endentulous state and no straws for continued precaution. Pt has met swallow goals and discharge ST at this time.    HPI HPI: 62 yo M with History of paroxysmal A. Fib, DM, CHF, CAD, CABG 2017, carotid stenosis (S/P L Carotid Endarterectomy), GERD, CVA, COPD, HTN, Abdominal Hernia, and Diabetes presented  generalized weakness and was found to have flu and was in A. Fib with RVR. Developed AKI requiring transient CRRT. Acute hypoxemic respiratory failure with bilateral pulmonary infiltrates. Intubated 2/12-2/21. CXR 2/22 increasing interstitial prominence, likely interstitial edema. Increasing bibasilar atelectasis. BSE 08/12/16 primary esophageal dysphagia. Esophagram same day no esophageal obstruction or stricture. Presbyesophagus. penetration of barium to the level of the vocal cords occurred during the exam and did elicit intermittent coughing. MBS 09/29/17 recommended D2, honey thick liquids. Repeat MBS recommended to assess diet/liquid upgrade.      SLP Plan  All goals met;Discharge SLP treatment due to (comment)       Recommendations  Diet recommendations: Dysphagia 3 (mechanical soft);Thin liquid Liquids provided via: Cup;No straw Medication Administration: Whole meds with puree Supervision: Patient able to self feed;Intermittent supervision to cue for compensatory strategies Compensations: Slow rate;Small  sips/bites Postural Changes and/or Swallow Maneuvers: Seated upright 90 degrees                Oral Care Recommendations: Oral care BID Follow up Recommendations: Skilled Nursing facility SLP Visit Diagnosis: Dysphagia, pharyngeal phase (R13.13) Plan: All goals met;Discharge SLP treatment due to (comment)       GO                Houston Siren 10/18/2017, 11:52 AM  Orbie Pyo Colvin Caroli.[employer] [phone]

## 2017-10-18 NOTE — Progress Notes (Signed)
In and out cath performed on patient with 620 ml of urine obtained, will continue to monitor pt for urinary retention.

## 2017-10-18 NOTE — Progress Notes (Signed)
Subjective:  Patient seen sitting comfortably in bed eating breakfast and playing dominos on his phone. Patient states that he continues to feel well today. Appears to be in good mood today. States that he went to chair yesterday and that he felt well while sitting upright. Continues IS to help with breathing, which he thinks is at least stable, possibly getting better. No acute complaints.   Objective:  Vital signs in last 24 hours: Vitals:   10/17/17 0637 10/17/17 1100 10/17/17 1931 10/18/17 0409  BP:   139/80 (!) 151/74  Pulse: (!) 53 (!) 54 (!) 51 (!) 50  Resp: 20 (!) 22 18 18   Temp:   (!) 97.5 F (36.4 C) 97.6 F (36.4 C)  TempSrc:   Axillary Axillary  SpO2: 95% 92% 100% 99%  Weight:    161 lb 9.6 oz (73.3 kg)  Height:       Physical Exam  Constitutional:  Appears older than stated age sitting comfortably in bed in no acute distress  Cardiovascular: Normal rate, regular rhythm and intact distal pulses. Exam reveals no friction rub.  No murmur heard. Respiratory:  No [place] today. No accessory muscle use or nasal flaring. Speaking in full sentences. Decreased breath sounds at lung bases bilaterally, with intermittent crackles throughout lower lung fields and no crackles in upper lung fields.  GI: Soft. Bowel sounds are normal. He exhibits no distension. There is no tenderness. There is no rebound.  Musculoskeletal: He exhibits no edema or tenderness (of bilateral lower extremities).  Skin: Skin is warm and dry. No rash noted. No erythema.   Assessment/Plan:  Principal Problem:   Acute respiratory failure with hypoxia (HCC) Active Problems:   DM (diabetes mellitus), type 2, uncontrolled ([place])   Essential hypertension   Paroxysmal atrial fibrillation with rapid ventricular response (HCC)   COPD (chronic obstructive pulmonary disease) (HCC)   Chronic combined systolic and diastolic CHF (congestive heart failure) (HCC)   Ischemic cardiomyopathy   Right bundle branch block  (RBBB) on electrocardiography   Tachy-brady syndrome (HCC)   Sinus pause   Influenza with respiratory manifestation   Aspiration pneumonia of both lower lobes (HCC)   AKI (acute kidney injury) ([place])   Pressure injury of skin   Anemia of chronic disease  Acute on Chronic Renal Failure:On intermittent HD via tunneled dialysis cather placed on 2/25for volume overload, last session yesterday (8 day interval between last two dialysis sessions). Cr 3.1 today, K+ 4.4 with BUN 34. No need for dialysis given these values, unsure if Cr of 3.1 new baseline. Patient had 850 mL UOP recorded yesterday with decreased dose of Lasix 80 mg BID. Patient continues to have decreased peripheral edema on physical exam, but crackles at lung bases 2/2 atelectasis/volume. Suspect patient will need to continue on dialysis as outpatient, but still hopeful for renal recovery. Currently working with nephrology regarding further dialysis sessions while inpatient.  -Nephrology consulting, recommendations appreciated  -Repeat Renal panel in AM -Lasix 80 mg BID  AcuteRespiratory Failure 2/2 recent pneumonia, influenza,and volume overload: Will continue to encourage pulmonary hygiene and out of bed as tolerated to prevent recurrent pneumonia during hospitalization. Patient seems to be participating and engaged in his care, as he uses these devices daily. -Continue monitoring respiratory status -Incentive spirometry and flutter valve -Out of bed as able, appreciate assistance from nursing staff  PAF with RVR, Tachy-Brady Syndrome: Patients heart rate stably in 50s. Cardiology consulted earlier in admission, patient to follow up as outpatient for pacemaker  placement.  -Continue telemetry -Continue amiodarone 200mg  BID and Warfarin per pharmacy (last INR = 2.66) -Avoid metoprolol to prevent any bradycardia given history of tachy-brady syndrome  DM: Current A1c on 09/28/17 was 7.9%. Recent CBGs continue to be appropriate. No  symptomatic hypoglycemia. -Continue current regimen of Lantus 14 units at bedtime withTID SSI at mealtime.  Sacral pressure ulcer, stage 2-3: Patient evaluated by wound care, who reported sacral ulcer (stage 2-3). Patient with multiple bowel movements, so will need to make sure that this is not exacerbated by this. -Continue daily wound care as recommended by wound care nurse -Appreciate assistance from nursing staff.  FEN/GI: -Dysphagia 3 diet -No IVF, electrolytes wnl -Pantoprazole 40 mg daily  VTE Prophylaxis:Heparin TID Code Status: Full  Dispo: Anticipated discharge pending renal improvement.  Thomasene Ripple, MD 10/18/2017, 9:30 AM Pager: [phone]

## 2017-10-18 NOTE — Progress Notes (Addendum)
Patient 5 hours post Foley Cath removal and has not voided.  Bladder scanned pt with 583 ml of urine retained.  Notified provider, Joni Reining, MD. Also, paged Corliss Parish, MD.

## 2017-10-18 NOTE — Progress Notes (Addendum)
Patient ate breakfast and had 240 ml orange juice.  CBG 117.

## 2017-10-18 NOTE — Progress Notes (Signed)
Received call back and verbal order from Corliss Parish, MD for a one-time in-and-out cath and to continue to monitor patient for signs and symptoms of urinary rentention.

## 2017-10-18 NOTE — Progress Notes (Signed)
Foley catheter removed per physician order Currie Paris, MD).  Pt will be monitored for urinary retention and ability to void.

## 2017-10-18 NOTE — Progress Notes (Signed)
ANTICOAGULATION CONSULT NOTE - Follow Up Consult  Pharmacy Consult for Coumadin Indication: atrial fibrillation  Patient Measurements: Height: 5' (152.4 cm) Weight: 161 lb 9.6 oz (73.3 kg) IBW/kg (Calculated) : 50  Vital Signs: Temp: 97.6 F (36.4 C) (03/13 0409) Temp Source: Axillary (03/13 0409) BP: 151/74 (03/13 0409) Pulse Rate: 50 (03/13 0409)  Labs: Recent Labs    10/16/17 0325 10/17/17 0243 10/18/17 0240  HGB 9.7* 9.3*  --   HCT 31.1* 30.6*  --   PLT 204 235  --   LABPROT 25.2* 28.1* 28.6*  INR 2.31 2.66 2.71  CREATININE 4.53* 4.65* 3.14*    Estimated Creatinine Clearance: 20.2 mL/min (A) (by C-G formula based on SCr of 3.14 mg/dL (H)).  Assessment:  61 yom on Xarelto 15mg  PTA for Afib (on med rec), not addressed initially in H&P by teaching service and prior Cards note says pt was NOT on anticoag 2/2 GIB in past.     Now on Coumadin since 10/03/17.   INR remains therapeutic (2.71), after decreasing dose from 4 to 3 mg on 3/12.  Also on Amiodarone.  Goal of Therapy:  INR 2-3 Monitor platelets by anticoagulation protocol: Yes   Plan:   Repeat Coumadin 3 mg today.  Daily PT/INR.  Arty Baumgartner, Geraldine Pager: [phone] or [phone] 10/18/2017,11:17 AM

## 2017-10-18 NOTE — Progress Notes (Signed)
Occupational Therapy Treatment Patient Details Name: [name] MRN: [id number] DOB: [birth year] Today's Date: 10/18/2017    History of present illness Pt is a 62 y.o. male admitted 09/15/17 with generalized weakness, cough, and dizziness; found to be back in a-fib with rapid rate. S/p temporary pacemaker placement 2/8; electrophysiology avoiding PPM implant due to pt's co-morbidities. Intubated 2/12-2/21. Also with acute on chronic renal failure; s/p R IJ tunnelled dialysis cath placement 10/02/17. PMH includes CHF, CAD (s/p CABG 2017), carotid stenosis (s/p L carotid endarterectomy), CVA, COPD, HTN, DM, intellectual disability.    OT comments  Pt declined OOB this session, increased ability to participate in bed level mobility and toileting. Spoke with the Pt about motivation, getting back to [facility] and his friends, and getting stronger. Pt continues to be agreeable to SNF for further therapy. OT will continue to follow.   Follow Up Recommendations  SNF;Supervision/Assistance - 24 hour    Equipment Recommendations  Wheelchair (measurements OT);Wheelchair cushion (measurements OT);3 in 1 bedside commode;Other (comment)    Recommendations for Other Services      Precautions / Restrictions Precautions Precautions: Fall Precaution Comments: s/p RIJ tunnelled cath 2/25 Restrictions Weight Bearing Restrictions: No       Mobility Bed Mobility Overal bed mobility: Needs Assistance Bed Mobility: Rolling Rolling: Min assist;Min guard(min A to the left, min guard rolling right)         General bed mobility comments: Pt used rails to assist with bed mobility this session  Transfers                 General transfer comment: deferred this session    Balance Overall balance assessment: Needs assistance Sitting-balance support: Bilateral upper extremity supported;Feet unsupported Sitting balance-Leahy Scale: Fair                                     ADL either  performed or assessed with clinical judgement   ADL Overall ADL's : Needs assistance/impaired     Grooming: Set up;Wash/dry hands;Wash/dry face;Bed level Grooming Details (indicate cue type and reason): bed level                 Toilet Transfer: Min guard(rolling)   Toileting- Clothing Manipulation and Hygiene: Total assistance;Bed level Toileting - Clothing Manipulation Details (indicate cue type and reason): Pt able to roll for peri care with use of bed rails, OT and RN staff doing all the cleaning due to body habitus       General ADL Comments: Pt did not want to get out of bed with therapy or practice sit to stand, and so he said, "I'm going to poop" and did     Vision       Perception     Praxis      Cognition Arousal/Alertness: Awake/alert Behavior During Therapy: WFL for tasks assessed/performed Overall Cognitive Status: History of cognitive impairments - at baseline                                          Exercises     Shoulder Instructions       General Comments      Pertinent Vitals/ Pain       Pain Assessment: No/denies pain Pain Intervention(s): Monitored during session  Home Living  Prior Functioning/Environment              Frequency  Min 2X/week        Progress Toward Goals  OT Goals(current goals can now be found in the care plan section)  Progress towards OT goals: Not progressing toward goals - comment(limited by fatigue this session)  Acute Rehab OT Goals Patient Stated Goal: Get stronger OT Goal Formulation: With patient Time For Goal Achievement: 10/19/17 Potential to Achieve Goals: [place] Discharge plan remains appropriate;Frequency remains appropriate    Co-evaluation                 AM-PAC PT "6 Clicks" Daily Activity     Outcome Measure   Help from another person eating meals?: A Little Help from another person taking  care of personal grooming?: A Little Help from another person toileting, which includes using toliet, bedpan, or urinal?: Total Help from another person bathing (including washing, rinsing, drying)?: A Lot Help from another person to put on and taking off regular upper body clothing?: A Lot Help from another person to put on and taking off regular lower body clothing?: Total 6 Click Score: 12    End of Session Equipment Utilized During Treatment: Oxygen  OT Visit Diagnosis: Unsteadiness on feet (R26.81);Other abnormalities of gait and mobility (R26.89);Muscle weakness (generalized) (M62.81);Other symptoms and signs involving cognitive function;Adult, failure to thrive (R62.7)   Activity Tolerance Patient limited by fatigue   Patient Left in bed;with call bell/phone within reach;with nursing/sitter in room   Nurse Communication Mobility status        Time: 1635-1650 OT Time Calculation (min): 15 min  Charges: OT General Charges $OT Visit: 1 Visit OT Treatments $Self Care/Home Management : 8-22 mins  Hulda Humphrey OTR/L [place] 10/18/2017, 5:38 PM

## 2017-10-18 NOTE — Progress Notes (Signed)
CBG 66 patient voiced no complaints O.J. Given Patient did have H.S. Snack and ate it.

## 2017-10-18 NOTE — Progress Notes (Signed)
[facility] Rounding Note  Background:  62 yo male, admitted 2/9 w/AF RVR, temp pacer required 2/2 long pauses w/hypotension, Flu A +, hypoxic resp failure (required vent support). AKI in this setting (ATN rel shock/sepsis vs vanco), CRRT 2/14 - 2/18. IHD off/onn since 09/27/17. Baseline creatinine appears to have been around 1.6-1.7 past year.   Assessment/Recommendations 1. AKI on CKD3 - shock/+/-vanco/sepsis related ATN. CRRT 2/14 - 2/18; HD has been required intermittently since 09/27/17. Now nearly 4 weeks into AKI.  Last HD 3/4 for volume mostly, next was 3/12 for clearance. Lasix helping UOP,  lasix 160 Q12H/trend labs and exam. I still cannot say at this time if long term HD will be needed or not -  Foley catheter in place for nearly a month- urology said keep in until pt can stand and void on his own- he cannot do this- however, I think we need to give a trial of removal.  No indications for HD today , cont to follow  2. PAF tachy/brady syndrome - amio/coumadin 3. S/p flu/PNA - post ATB's/tamiflu 4. Systolic HF - EF 22-97%. Making great urine- actually have decreased lasix  5. Anemia on Aranesp 150 QWeek Last dosed 3/6. Had 2 doses Feraheme per pharmacy (2/23, 2/25) 6. DM per primary team 7. Dysphagia - D3 diet  Lavin Petteway A 10/15/2017, 3:22 PM  Subjective/Interval History:  HD 3/4 (for volume mostly)- then did again early 3/12 for clearance- feels OK Making urine with lasix at least 850-  says is a little nauseated today  Now continue to follow UOP and crt but needs foley out  Objective Vital signs in last 24 hours: Vitals:   10/17/17 0637 10/17/17 1100 10/17/17 1931 10/18/17 0409  BP:   139/80 (!) 151/74  Pulse: (!) 53 (!) 54 (!) 51 (!) 50  Resp: 20 (!) 22 18 18   Temp:   (!) 97.5 F (36.4 C) 97.6 F (36.4 C)  TempSrc:   Axillary Axillary  SpO2: 95% 92% 100% 99%  Weight:    73.3 kg (161 lb 9.6 oz)  Height:       Weight change: -0.4 kg (-14.1 oz)  Intake/Output Summary  (Last 24 hours) at 10/18/2017 1022 Last data filed at 10/18/2017 0800 Gross per 24 hour  Intake 720 ml  Output 1050 ml  Net -330 ml   Physical Exam:  Blood pressure (!) 151/74, pulse (!) 50, temperature 97.6 F (36.4 C), temperature source Axillary, resp. rate 18, height 5' (1.524 m), weight 73.3 kg (161 lb 9.6 oz), SpO2 99 %.  Pale, very pleasant Wearing O2 Ant coarse breath sounds Crackles at bases persistent Chest sternotomy scar R TDC catheter with dressing (placed 2/21) S1S2 NoS3 Abd soft not tender with pitting edema esp R abd wall LE pitting edema only trace Abrasions and scabs LE's   Recent Labs  Lab 10/12/17 0205 10/13/17 0541 10/14/17 0902 10/15/17 0606 10/16/17 0325 10/17/17 0243 10/18/17 0240  NA 137 136 134* 131* 133* 132* 133*  K 3.8 4.5 4.8 5.3* 4.7 4.7 4.4  CL 100* 98* 94* 92* 92* 90* 95*  CO2 27 27 28 28 26 28 28   GLUCOSE 106* 83 102* 101* 138* 95 80  BUN 30* 35* 43* 50* 55* 61* 34*  CREATININE 3.67* 4.01* 4.24* 4.48* 4.53* 4.65* 3.14*  CALCIUM 8.5* 8.6* 8.5* 8.9 8.7* 8.7* 8.4*  PHOS 5.4* 4.6 4.4 4.5 5.7* 5.9* 3.8    Recent Labs  Lab 10/16/17 0325 10/17/17 0243 10/18/17 0240  ALBUMIN 2.7* 2.8*  2.7*   Recent Labs  Lab 10/13/17 0541 10/16/17 0325 10/17/17 0243  WBC 5.4 5.9 4.9  HGB 9.7* 9.7* 9.3*  HCT 31.2* 31.1* 30.6*  MCV 91.5 90.4 91.1  PLT 135* 204 235    Recent Labs  Lab 10/17/17 0636 10/17/17 1656 10/17/17 2119 10/18/17 0617 10/18/17 0922  GLUCAP 77 95 97 66 117*        Component Value Date/Time   IRON 17 (L) 09/30/2017 1435   TIBC 133 (L) 09/30/2017 1435   FERRITIN 87 09/30/2017 1435   IRONPCTSAT 13 (L) 09/30/2017 1435   IRONPCTSAT 8 (L) 12/13/2012 1554   Medications:  . amiodarone  200 mg Oral BID  . atorvastatin  80 mg Oral Daily  . chlorhexidine  15 mL Mouth Rinse BID  . coumadin book   Does not apply Once  . darbepoetin (ARANESP) injection - NON-DIALYSIS  150 mcg Subcutaneous Q Wed-1800  . feeding  supplement (ENSURE ENLIVE)  237 mL Oral BID BM  . furosemide  80 mg Intravenous Q12H  . hydrocerin   Topical BID  . insulin aspart  0-20 Units Subcutaneous TID WC  . insulin glargine  14 Units Subcutaneous QHS  . mouth rinse  15 mL Mouth Rinse q12n4p  . multivitamin  1 tablet Oral QHS  . pantoprazole  40 mg Oral Daily  . PARoxetine  40 mg Oral Daily  . polyethylene glycol  17 g Oral Daily  . senna  1 tablet Oral BID  . senna-docusate  1 tablet Oral QHS  . warfarin   Does not apply Once  . Warfarin - Pharmacist Dosing Inpatient   Does not apply [phone]

## 2017-10-19 DIAGNOSIS — R10814 Left lower quadrant abdominal tenderness: Secondary | ICD-10-CM

## 2017-10-19 DIAGNOSIS — R10813 Right lower quadrant abdominal tenderness: Secondary | ICD-10-CM

## 2017-10-19 LAB — RENAL FUNCTION PANEL
ANION GAP: 13 (ref 5–15)
Albumin: 3.2 g/dL — ABNORMAL LOW (ref 3.5–5.0)
BUN: 42 mg/dL — AB (ref 6–20)
CALCIUM: 8.9 mg/dL (ref 8.9–10.3)
CO2: 26 mmol/L (ref 22–32)
Chloride: 91 mmol/L — ABNORMAL LOW (ref 101–111)
Creatinine, Ser: 3.32 mg/dL — ABNORMAL HIGH (ref 0.61–1.24)
GFR calc Af Amer: 21 mL/min — ABNORMAL LOW (ref >60)
GFR calc non Af Amer: 18 mL/min — ABNORMAL LOW (ref >60)
GLUCOSE: 92 mg/dL (ref 65–99)
Phosphorus: 3.6 mg/dL (ref 2.5–4.6)
Potassium: 5.2 mmol/L — ABNORMAL HIGH (ref 3.5–5.1)
Sodium: 130 mmol/L — ABNORMAL LOW (ref 135–145)

## 2017-10-19 LAB — CBC
HEMATOCRIT: 33.2 % — AB (ref 39.0–52.0)
HEMOGLOBIN: 10.5 g/dL — AB (ref 13.0–17.0)
MCH: 28.2 pg (ref 26.0–34.0)
MCHC: 31.6 g/dL (ref 30.0–36.0)
MCV: 89.2 fL (ref 78.0–100.0)
Platelets: 289 10*3/uL (ref 150–400)
RBC: 3.72 MIL/uL — ABNORMAL LOW (ref 4.22–5.81)
RDW: 18.8 % — ABNORMAL HIGH (ref 11.5–15.5)
WBC: 7.6 10*3/uL (ref 4.0–10.5)

## 2017-10-19 LAB — GLUCOSE, CAPILLARY
GLUCOSE-CAPILLARY: 99 mg/dL (ref 65–99)
GLUCOSE-CAPILLARY: 80 mg/dL (ref 65–99)
Glucose-Capillary: 97 mg/dL (ref 65–99)
Glucose-Capillary: 140 mg/dL — ABNORMAL HIGH (ref 65–99)

## 2017-10-19 LAB — PROTIME-INR
INR: 2.15
PROTHROMBIN TIME: 23.8 s — AB (ref 11.4–15.2)

## 2017-10-19 MED ORDER — WARFARIN SODIUM 4 MG PO TABS
4.0000 mg | ORAL_TABLET | Freq: Once | ORAL | Status: AC
  Administered 2017-10-19: 4 mg via ORAL
  Filled 2017-10-19: qty 1

## 2017-10-19 NOTE — Care Management Note (Signed)
Case Management Note Previous CM note completed by Zenon Mayo, RN 09/20/2017, 8:03 PM    Patient Details  Name: SUHAAS AGENA MRN: [id number] Date of Birth: [birth year]  Subjective/Objective:   From home with sister, [name] [phone] pta was able to do grocery shopping and some cooking.  He has a walker , cane and hospital bed at home.  [facility] to [facility], Tue, and Wed.   Presents with tachy brady syndrom with 6 -10 second pauses, afib with rvr, influenza A, acute renal failure, bil infiltrates, sepsis, temp pacer placed 2/8, intubated on 2/12 due to declining resp status, nephrology consulted , plan to place HD cath tomorrow and if kidney function does not improve plan for CVVHD.     2/13 Tomi Bamberger RN, BSN - Natasha Mead sndrome with 6-10 second pauses , afib with rvr, influenza A, acuter renal failure, bil infiltrates, sepsis. Temp pacer placed 2/8, intubated 2/12 on full vent support.  Nephrology consulted, plan for HD cath placement today and start CVVHD if no improvement in renal functin.  conts on iv abc, pressors, hep drip.   2/15 [place], BSN - abd distened,will get KUB today, started on CRRT.   2/19 Tomi Bamberger RN, BSN - Natasha Mead Syndrome with intermittent pauses, afib with RVR, acute resp failure secondary to flu A,on full vent support since 212, bil infiltrates, acute renal failure  Temp pacer, placed 2/8.  Nephrology following.  CRRT stopped paln for IHD, failed pressure support wean yesterday, Temp pacer pulled yesterday, plan for permanent pacer when ever acute issues over.  conts on amio, precedex, fentanyl, hep drip, iv abx.     2/20 Tomi Bamberger RN, BSN- CRRT stopped , now on IHD, will attempt SBT in am, if no significant wean in next few days MD will discuss trach. conts on amio, hep drip and solucortef.             Action/Plan: NCM  Will follow for transition of care needs.  Expected Discharge Date:                   Expected Discharge Plan:  Skilled Nursing Facility  In-House Referral:  Clinical Social Work  Discharge planning Services  CM Consult  Post Acute Care Choice:  NA Choice offered to:  NA  DME Arranged:  N/A DME Agency:  NA  HH Arranged:  NA HH Agency:  NA  Status of Service:  In process  If discussed at Long Length of Stay Meetings, dates discussed:  3/12, 3/14  Discharge Disposition:   Additional Comments:  10/17/17- 1500- Marvetta Gibbons RN, CM- pt continues to need IHD- renal following but unable to declare ESRD at this time- CSW continues to follow for SNF placement needs.   Dahlia Client [place], RN 10/19/2017, 4:17 PM [phone] 4E Transition Care Coordinator

## 2017-10-19 NOTE — Progress Notes (Signed)
ANTICOAGULATION CONSULT NOTE - Follow Up Consult  Pharmacy Consult for Coumadin Indication: atrial fibrillation  Patient Measurements: Height: 5' (152.4 cm) Weight: 158 lb 15.2 oz (72.1 kg) IBW/kg (Calculated) : 50  Vital Signs: Temp: 97.5 F (36.4 C) (03/14 0326) Temp Source: Oral (03/14 0326) BP: 155/75 (03/14 0326) Pulse Rate: 60 (03/14 0326)  Labs: Recent Labs    10/17/17 0243 10/18/17 0240 10/19/17 0721  HGB 9.3*  --  10.5*  HCT 30.6*  --  33.2*  PLT 235  --  289  LABPROT 28.1* 28.6* 23.8*  INR 2.66 2.71 2.15  CREATININE 4.65* 3.14* 3.32*    Estimated Creatinine Clearance: 18.9 mL/min (A) (by C-G formula based on SCr of 3.32 mg/dL (H)).  Assessment:  1 yom on Xarelto 15mg  PTA for Afib (on med rec), not addressed initially in H&P by teaching service and prior Cards note says pt was NOT on anticoag 2/2 GIB in past.   He is receiving oral Warfarin therapy and today his INR is 2.15 which is down from 2.71 yesterday with doses charted as received.  No noted bleeding complications identified.  He has a potential drug/drug interaction with Amiodarone but seems to be ok with current regimen.    Goal of Therapy:  INR 2-3 Monitor platelets by anticoagulation protocol: Yes   Plan:   Coumadin 4 mg today.  Daily PT/INR.  Rober Minion, PharmD., MS Clinical Pharmacist Pager:  [phone] Thank you for allowing pharmacy to be part of this patients care team. 10/19/2017,9:54 AM

## 2017-10-19 NOTE — Progress Notes (Signed)
16 Fr Foley inserted by Luetta Nutting, RN and SWAT, nurse, Mel, M, RN, per order by Dr. Moshe Cipro, MD.  Pt tolerated procedure well with no complaints.  Urine output following Foley insertion 600 ml.

## 2017-10-19 NOTE — Plan of Care (Signed)
  Health Behavior/Discharge Planning: Ability to manage health-related needs will improve 10/19/2017 0103 - Progressing by Blair Promise, RN   Clinical Measurements: Ability to maintain clinical measurements within normal limits will improve 10/19/2017 0103 - Progressing by Blair Promise, RN   Clinical Measurements: Will remain free from infection 10/19/2017 0103 - Progressing by Blair Promise, RN   Clinical Measurements: Diagnostic test results will improve 10/19/2017 0103 - Progressing by Blair Promise, RN   Clinical Measurements: Respiratory complications will improve 10/19/2017 0103 - Progressing by Blair Promise, RN   Clinical Measurements: Cardiovascular complication will be avoided 10/19/2017 0103 - Progressing by Blair Promise, RN   Activity: Risk for activity intolerance will decrease 10/19/2017 0103 - Progressing by Blair Promise, RN   Progressing Health Behavior/Discharge Planning: Ability to manage health-related needs will improve 10/19/2017 0103 - Progressing by Blair Promise, RN Clinical Measurements: Ability to maintain clinical measurements within normal limits will improve 10/19/2017 0103 - Progressing by Blair Promise, RN Will remain free from infection 10/19/2017 0103 - Progressing by Blair Promise, RN Diagnostic test results will improve 10/19/2017 0103 - Progressing by Blair Promise, RN Respiratory complications will improve 10/19/2017 0103 - Progressing by Blair Promise, RN Cardiovascular complication will be avoided 10/19/2017 0103 - Progressing by Blair Promise, RN Activity: Risk for activity intolerance will decrease 10/19/2017 0103 - Progressing by Blair Promise, RN Nutrition: Adequate nutrition will be maintained 10/19/2017 0103 - Progressing by Blair Promise, RN Elimination: Will not experience complications related to  bowel motility 10/19/2017 0103 - Progressing by Blair Promise, RN Will not experience complications related to urinary retention 10/19/2017 0103 - Progressing by Blair Promise, RN Safety: Ability to remain free from injury will improve 10/19/2017 0103 - Progressing by Blair Promise, RN Skin Integrity: Risk for impaired skin integrity will decrease 10/19/2017 0103 - Progressing by Blair Promise, RN Education: Knowledge of disease or condition will improve 10/19/2017 0103 - Progressing by Blair Promise, RN Understanding of medication regimen will improve 10/19/2017 0103 - Progressing by Blair Promise, RN Activity: Ability to tolerate increased activity will improve 10/19/2017 0103 - Progressing by Blair Promise, RN Cardiac: Ability to achieve and maintain adequate cardiopulmonary perfusion will improve 10/19/2017 0103 - Progressing by Blair Promise, RN Health Behavior/Discharge Planning: Ability to safely manage health-related needs after discharge will improve 10/19/2017 0103 - Progressing by Blair Promise, RN Education: Knowledge of disease and its progression will improve 10/19/2017 0103 - Progressing by Blair Promise, RN Health Behavior/Discharge Planning: Ability to manage health-related needs will improve 10/19/2017 0103 - Progressing by Blair Promise, RN Clinical Measurements: Complications related to the disease process or treatment will be avoided or minimized 10/19/2017 0103 - Progressing by Blair Promise, RN Dialysis access will remain free of complications 1/94/1740 8144 - Progressing by Blair Promise, RN Activity: Activity intolerance will improve 10/19/2017 0103 - Progressing by Blair Promise, RN Fluid Volume: Fluid volume balance will be maintained or improved 10/19/2017 0103 - Progressing by Blair Promise, RN Nutritional: Ability to make  appropriate dietary choices will improve 10/19/2017 0103 - Progressing by Blair Promise, RN Respiratory: Respiratory symptoms related to disease process will be avoided 10/19/2017 0103 - Progressing by Blair Promise, RN Urinary Elimination: Progression of disease will be identified and treated 10/19/2017 0103 - Progressing by Blair Promise, RN

## 2017-10-19 NOTE — Progress Notes (Signed)
Patient complaining of pain and urgency to urinate. Patient tried several times to urinate in urinal.  Bladder scan patient, 548cc in bladder.  In and out cath per protocol; 800cc output; Will continue to monitor.

## 2017-10-19 NOTE — Progress Notes (Signed)
Subjective:  Patient seen laying comfortably in bed this AM in no acute distress. Patient states that his abdomen hurts this AM, likely because he has been unable to urinate since his catheter was removed yesterday. Patient denies pain with I&O cath overnight. Patient has no complaints.  Objective:  Vital signs in last 24 hours: Vitals:   10/18/17 1252 10/18/17 1500 10/18/17 2001 10/19/17 0326  BP: 125/73  129/73 (!) 155/75  Pulse: (!) 58 60 (!) 58 60  Resp: (!) 21 (!) 24 16 (!) 25  Temp: 98.6 F (37 C)  97.8 F (36.6 C) (!) 97.5 F (36.4 C)  TempSrc: Oral  Oral Oral  SpO2: 98% 95% 97% 96%  Weight:    158 lb 15.2 oz (72.1 kg)  Height:       Physical Exam  Constitutional:  Appears older than stated age sitting comfortably in bed in no acute distress  Cardiovascular: Normal rate, regular rhythm and intact distal pulses. Exam reveals no friction rub.  No murmur heard. Respiratory:  No accessory muscle use or nasal flaring. Speaking in full sentences. Intermittent crackles throughout lower lung fields, improved from yesterday's exam.  GI: Soft. Bowel sounds are normal. He exhibits no distension. There is tenderness (mild, RLQ and LLQ). There is no rebound.  Musculoskeletal: He exhibits no edema or tenderness (of bilateral lower extremities).  Skin: Skin is warm and dry. No rash noted. No erythema.   Assessment/Plan:  Principal Problem:   Acute respiratory failure with hypoxia (HCC) Active Problems:   DM (diabetes mellitus), type 2, uncontrolled ([place])   Essential hypertension   Paroxysmal atrial fibrillation with rapid ventricular response (HCC)   COPD (chronic obstructive pulmonary disease) (HCC)   Chronic combined systolic and diastolic CHF (congestive heart failure) (HCC)   Ischemic cardiomyopathy   Right bundle branch block (RBBB) on electrocardiography   Tachy-brady syndrome (HCC)   Sinus pause   Influenza with respiratory manifestation   Aspiration pneumonia of  both lower lobes (HCC)   AKI (acute kidney injury) ([place])   Pressure injury of skin   Anemia of chronic disease  Acute on Chronic Renal Failure:On intermittent HD via tunneled dialysis cather placed on 2/25for volume overload, last session yesterday (8 day interval between last two dialysis sessions). Cr this AM 3.3, relatively stable from 3.1 yesterday. K+ elevated to 5.2 today, from 4.4 yesterday. Nephrology states no indication for dialysis today. Patient had 2.4 mL UOP recorded yesterday with Lasix 80 mg BID. -Nephrology consulting, recommendations appreciated  -Repeat Renal panel in AM -Lasix 80 mg BID  Urinary retention: Foley placed in ICU, as patient developed acute urinary retention while acutely ill. Trial of void attempted yesterday and patient unable to urinate on his own. Per chart review patient has had at least 2 I&O catheterizations with >500 cc urine output. Will need to replace urinary catheter and follow up with urology as outpatient once patient's functional status more towards baseline.  -Continue to clinically monitor -Replace foley catheter  AcuteRespiratory Failure 2/2 recent pneumonia, influenza,and volume overload: Currently stable on room air at rest. Unsure about oxygen status with movement because patient relatively deconditioned from prolonged hospitalization. Will continue to encourage mobilization and check ambulation 02 sats. -Incentive spirometry and flutter valve -Out of bed as able, appreciate assistance from nursing staff -Ambulate with O2  PAF with RVR, Tachy-Brady Syndrome: Patients heart rate stably in 50s. Cardiology consulted earlier in admission, patient to follow up as outpatient for pacemaker placement.  -Continue telemetry -Continue  amiodarone 200mg  BID and Warfarin per pharmacy (last INR = 2.66) -Avoid metoprolol to prevent any bradycardia  DM: Current A1c on 09/28/17 was 7.9%. Recent CBGs continue to be appropriate.  -Continue current  regimen of Lantus 14 units at bedtime withTID SSI at mealtime.  Sacral pressure ulcer, stage 2-3:  -Appreciate assistance from nursing staff with keeping area clean, dry  FEN/GI: -Dysphagia 3 diet, no IVF -Pantoprazole 40 mg daily  VTE Prophylaxis:Heparin TID Code Status: Full  Dispo: Anticipated discharge pending renal improvement.  Thomasene Ripple, MD 10/19/2017, 7:20 AM Pager: [phone]

## 2017-10-19 NOTE — Progress Notes (Signed)
Physical Therapy Treatment Patient Details Name: [name] MRN: [id number] DOB: 05/05/1956 Today's Date: 10/19/2017    History of Present Illness Pt is a 62 y.o. male admitted 09/15/17 with generalized weakness, cough, and dizziness; found to be back in a-fib with rapid rate. S/p temporary pacemaker placement 2/8; electrophysiology avoiding PPM implant due to pt's co-morbidities. Intubated 2/12-2/21. Also with acute on chronic renal failure; s/p R IJ tunnelled dialysis cath placement 10/02/17. PMH includes CHF, CAD (s/p CABG 2017), carotid stenosis (s/p L carotid endarterectomy), CVA, COPD, HTN, DM, intellectual disability.     PT Comments    Pt performed increased activity during session.  Pt stood x2 reps.  On first attempt he presented with bowel incontinence but able to stand x 20 sec before sitting.  During second standing trial patient performed steps from bed to chair.  Pt required cues for safety as he sits impulsively.  Plan remains appropriate for SNF as he remains unsafe to return home without 24 hour assistance.     Follow Up Recommendations  SNF;Supervision/Assistance - 24 hour     Equipment Recommendations  None recommended by PT    Recommendations for Other Services       Precautions / Restrictions Precautions Precautions: Fall Precaution Comments: s/p RIJ tunnelled cath 2/25 Restrictions Weight Bearing Restrictions: No    Mobility  Bed Mobility Overal bed mobility: Needs Assistance Bed Mobility: Supine to Sit     Supine to sit: Mod assist     General bed mobility comments: Pt used rails to assist with bed mobility this session, Assistance for upper trunk control.    Transfers Overall transfer level: Needs assistance Equipment used: Rolling walker (2 wheeled) Transfers: Squat Pivot Transfers Sit to Stand: Min assist;+2 physical assistance         General transfer comment: Min +2 to stand x 2 trials.  Pt sits impulsivelty without warning and  required mod assistance to return to seated surface safely.  Cues for hand placement.    Ambulation/Gait Ambulation/Gait assistance: (Pt performed steps from bed to chair before sitting impulsively.  Required mod assistance +1 with +2 for equipment.  ) with RW.                 Stairs            Wheelchair Mobility    Modified Rankin (Stroke Patients Only)       Balance Overall balance assessment: Needs assistance   Sitting balance-Leahy Scale: Fair       Standing balance-Leahy Scale: Poor                              Cognition Arousal/Alertness: Awake/alert Behavior During Therapy: WFL for tasks assessed/performed Overall Cognitive Status: History of cognitive impairments - at baseline                                        Exercises      General Comments        Pertinent Vitals/Pain Pain Assessment: No/denies pain Faces Pain Scale: Hurts little more Pain Location: rear/sacral area  Pain Descriptors / Indicators: Sore Pain Intervention(s): Monitored during session    Home Living                      Prior Function  PT Goals (current goals can now be found in the care plan section) Acute Rehab PT Goals Patient Stated Goal: Get stronger Potential to Achieve Goals: Good Progress towards PT goals: Progressing toward goals    Frequency    Min 2X/week      PT Plan Current plan remains appropriate    Co-evaluation              AM-PAC PT "6 Clicks" Daily Activity  Outcome Measure  Difficulty turning over in bed (including adjusting bedclothes, sheets and blankets)?: Unable Difficulty moving from lying on back to sitting on the side of the bed? : Unable Difficulty sitting down on and standing up from a chair with arms (e.g., wheelchair, bedside commode, etc,.)?: Unable Help needed moving to and from a bed to chair (including a wheelchair)?: A Lot Help needed walking in hospital room?: A  Lot Help needed climbing 3-5 steps with a railing? : Total 6 Click Score: 8    End of Session Equipment Utilized During Treatment: Gait belt Activity Tolerance: Patient tolerated treatment well Patient left: in bed;with call bell/phone within reach Nurse Communication: Mobility status PT Visit Diagnosis: Other abnormalities of gait and mobility (R26.89);Muscle weakness (generalized) (M62.81);Difficulty in walking, not elsewhere classified (R26.2)     Time: 1610-9604 PT Time Calculation (min) (ACUTE ONLY): 25 min  Charges:  $Therapeutic Activity: 23-37 mins                    G CodesGovernor Rooks, PTA pager [phone]    Cristela Blue 10/19/2017, 1:41 PM

## 2017-10-19 NOTE — Progress Notes (Signed)
Clinical Social Worker continue to follow patient for support an discharge needs. At this time patient is not yet stable from a medical stand point to discharge to rehab. CSW will continue to follow until patient has been medically cleared.Patient has a bed at [facility] for short term rehab   Rhea Pink, MSW,  [place]

## 2017-10-19 NOTE — Progress Notes (Signed)
[facility] Rounding Note  Background:  62 yo male, admitted 2/9 w/AF RVR, temp pacer required 2/2 long pauses w/hypotension, Flu [name] +, hypoxic resp failure (required vent support). AKI in this setting (ATN rel shock/sepsis vs vanco), CRRT 2/14 - 2/18. IHD off/onn since 09/27/17. Baseline creatinine appears to have been around 1.6-1.7 past year.   Assessment/Recommendations 1. AKI on CKD3 - shock/+/-vanco/sepsis related ATN. CRRT 2/14 - 2/18; HD has been required intermittently since 09/27/17. Now nearly 4 weeks into AKI.  Last HD 3/4 for volume mostly, next was 3/12 for clearance. Lasix helping UOP,  lasix 80 Q12H/trend labs and exam. I still cannot say at this time if long term HD will be needed or not - crt up but only slightly  Foley catheter in place for nearly [name] month- removed but unable to spontaneously void.  Will replace foley.  No indications for HD today , cont to follow  2. PAF tachy/brady syndrome - amio/coumadin 3. S/p flu/PNA - post ATB's/tamiflu 4. Systolic HF - EF 01-77%. Making great urine- actually have decreased lasix  5. Anemia on Aranesp 150 QWeek Last dosed 3/6. Had 2 doses Feraheme per pharmacy (2/23, 2/25)- better 6. DM per primary team 7. Dysphagia - D3 diet  [name] 10/15/2017, 3:22 PM  Subjective/Interval History:  HD 3/4 (for volume mostly)- then did again early 3/12 for clearance- feels OK Making urine with 2400 recorded  No spontaneous UOP- needs foley replaced   Objective Vital signs in last 24 hours: Vitals:   10/18/17 1252 10/18/17 1500 10/18/17 2001 10/19/17 0326  BP: 125/73  129/73 (!) 155/75  Pulse: (!) 58 60 (!) 58 60  Resp: (!) 21 (!) 24 16 (!) 25  Temp: 98.6 F (37 C)  97.8 F (36.6 C) (!) 97.5 F (36.4 C)  TempSrc: Oral  Oral Oral  SpO2: 98% 95% 97% 96%  Weight:    72.1 kg (158 lb 15.2 oz)  Height:       Weight change: -1.2 kg (-10.3 oz)  Intake/Output Summary (Last 24 hours) at 10/19/2017 9390 Last data filed at 10/19/2017 0600 Gross  per 24 hour  Intake 916 ml  Output 2240 ml  Net -1324 ml   Physical Exam:  Blood pressure (!) 155/75, pulse 60, temperature (!) 97.5 F (36.4 C), temperature source Oral, resp. rate (!) 25, height 5' (1.524 m), weight 72.1 kg (158 lb 15.2 oz), SpO2 96 %.  Pale, very pleasant Wearing O2 Ant coarse breath sounds Crackles at bases persistent Chest sternotomy scar R TDC catheter with dressing (placed 2/21) S1S2 NoS3 Abd soft not tender with pitting edema esp R abd wall LE pitting edema only trace Abrasions and scabs LE's   Recent Labs  Lab 10/13/17 0541 10/14/17 0902 10/15/17 0606 10/16/17 0325 10/17/17 0243 10/18/17 0240 10/19/17 0721  NA 136 134* 131* 133* 132* 133* 130*  K 4.5 4.8 5.3* 4.7 4.7 4.4 5.2*  CL 98* 94* 92* 92* 90* 95* 91*  CO2 27 28 28 26 28 28 26   GLUCOSE 83 102* 101* 138* 95 80 92  BUN 35* 43* 50* 55* 61* 34* 42*  CREATININE 4.01* 4.24* 4.48* 4.53* 4.65* 3.14* 3.32*  CALCIUM 8.6* 8.5* 8.9 8.7* 8.7* 8.4* 8.9  PHOS 4.6 4.4 4.5 5.7* 5.9* 3.8 3.6    Recent Labs  Lab 10/17/17 0243 10/18/17 0240 10/19/17 0721  ALBUMIN 2.8* 2.7* 3.2*   Recent Labs  Lab 10/13/17 0541 10/16/17 0325 10/17/17 0243 10/19/17 0721  WBC 5.4 5.9 4.9 7.6  HGB 9.7* 9.7* 9.3* 10.5*  HCT 31.2* 31.1* 30.6* 33.2*  MCV 91.5 90.4 91.1 89.2  PLT 135* 204 235 289    Recent Labs  Lab 10/18/17 0922 10/18/17 1123 10/18/17 1605 10/18/17 2217 10/19/17 0622  GLUCAP 117* 72 108* 129* 99        Component Value Date/Time   IRON 17 (L) 09/30/2017 1435   TIBC 133 (L) 09/30/2017 1435   FERRITIN 87 09/30/2017 1435   IRONPCTSAT 13 (L) 09/30/2017 1435   IRONPCTSAT 8 (L) 12/13/2012 1554   Medications:  . amiodarone  200 mg Oral BID  . atorvastatin  80 mg Oral Daily  . chlorhexidine  15 mL Mouth Rinse BID  . darbepoetin (ARANESP) injection - NON-DIALYSIS  150 mcg Subcutaneous Q Wed-1800  . feeding supplement (ENSURE ENLIVE)  237 mL Oral BID BM  . furosemide  80 mg Intravenous  Q12H  . hydrocerin   Topical BID  . insulin aspart  0-20 Units Subcutaneous TID WC  . insulin glargine  14 Units Subcutaneous QHS  . mouth rinse  15 mL Mouth Rinse q12n4p  . multivitamin  1 tablet Oral QHS  . pantoprazole  40 mg Oral Daily  . PARoxetine  40 mg Oral Daily  . polyethylene glycol  17 g Oral Daily  . senna  1 tablet Oral BID  . senna-docusate  1 tablet Oral QHS  . Warfarin - Pharmacist Dosing Inpatient   Does not apply [phone]

## 2017-10-20 LAB — RENAL FUNCTION PANEL
Albumin: 3 g/dL — ABNORMAL LOW (ref 3.5–5.0)
Anion gap: 10 (ref 5–15)
BUN: 44 mg/dL — ABNORMAL HIGH (ref 6–20)
CO2: 29 mmol/L (ref 22–32)
CREATININE: 3.85 mg/dL — AB (ref 0.61–1.24)
Calcium: 9.2 mg/dL (ref 8.9–10.3)
Chloride: 92 mmol/L — ABNORMAL LOW (ref 101–111)
GFR calc non Af Amer: 15 mL/min — ABNORMAL LOW (ref >60)
GFR, EST AFRICAN AMERICAN: 18 mL/min — AB (ref >60)
Glucose, Bld: 94 mg/dL (ref 65–99)
Phosphorus: 4.6 mg/dL (ref 2.5–4.6)
Potassium: 4.9 mmol/L (ref 3.5–5.1)
Sodium: 131 mmol/L — ABNORMAL LOW (ref 135–145)

## 2017-10-20 LAB — GLUCOSE, CAPILLARY
GLUCOSE-CAPILLARY: 106 mg/dL — AB (ref 65–99)
GLUCOSE-CAPILLARY: 170 mg/dL — AB (ref 65–99)
Glucose-Capillary: 81 mg/dL (ref 65–99)
Glucose-Capillary: 97 mg/dL (ref 65–99)
Glucose-Capillary: 162 mg/dL — ABNORMAL HIGH (ref 65–99)

## 2017-10-20 LAB — PROTIME-INR
INR: 1.84
PROTHROMBIN TIME: 21.1 s — AB (ref 11.4–15.2)

## 2017-10-20 MED ORDER — FUROSEMIDE 80 MG PO TABS
80.0000 mg | ORAL_TABLET | Freq: Two times a day (BID) | ORAL | Status: DC
  Administered 2017-10-20 – 2017-10-22 (×5): 80 mg via ORAL
  Filled 2017-10-20 (×4): qty 1

## 2017-10-20 MED ORDER — WARFARIN SODIUM 4 MG PO TABS
4.0000 mg | ORAL_TABLET | Freq: Once | ORAL | Status: AC
  Administered 2017-10-20: 4 mg via ORAL
  Filled 2017-10-20: qty 1

## 2017-10-20 NOTE — Progress Notes (Signed)
[facility] Rounding Note  Background:  62 yo male, admitted 2/9 w/AF RVR, temp pacer required 2/2 long pauses w/hypotension, Flu A +, hypoxic resp failure (required vent support). AKI in this setting (ATN rel shock/sepsis vs vanco), CRRT 2/14 - 2/18. IHD off/onn since 09/27/17. Baseline creatinine appears to have been around 1.6-1.7 past year.   Assessment/Recommendations 1. AKI on CKD3 - shock/+/-vanco/sepsis related ATN. CRRT 2/14 - 2/18; HD has been required intermittently since 09/27/17. Now nearly 4 weeks into AKI.  Last HD 3/4 for volume mostly, next was 3/12 for clearance. Lasix helping UOP,  lasix 80 Q12H/trend labs and exam. I still cannot say at this time if long term HD will be needed or not - crt up but only slightly    No indications for HD today , unfortunately labs continue to worsen by the smallest amount daily so I am unable to say he is clear from needing more HD.  Will need to see if his insurance will pay for acute dialysis could maybe set up 2 times per week as OP as we continue to watch for improvement 2. PAF tachy/brady syndrome - amio/coumadin 3. S/p flu/PNA - post ATB's/tamiflu 4. Systolic HF - EF 01-09%. Making great urine- actually have decreased lasix - will change lasix to 80 PO BID  5. Anemia on Aranesp 150 QWeek Last dosed 3/6. Had 2 doses Feraheme per pharmacy (2/23, 2/25)- better 6. DM per primary team 7. Dysphagia - D3 diet 8. Urinary retention - will need to be addressed at some point- foley replaced as he failed voiding trial  Dainel Arcidiacono A 10/15/2017, 3:22 PM  Subjective/Interval History:  Foley cath back in- 2250 of UOP  Objective Vital signs in last 24 hours: Vitals:   10/19/17 0326 10/19/17 1346 10/19/17 2110 10/20/17 0425  BP: (!) 155/75 112/63 129/73 (!) 155/77  Pulse: 60 63  (!) 57  Resp: (!) 25 (!) 22 19 17   Temp: (!) 97.5 F (36.4 C) 97.8 F (36.6 C) 97.9 F (36.6 C) 98 F (36.7 C)  TempSrc: Oral Oral Oral Oral  SpO2: 96% 98% 99% 100%   Weight: 72.1 kg (158 lb 15.2 oz)   72.5 kg (159 lb 13.3 oz)  Height:       Weight change: 0.4 kg (14.1 oz)  Intake/Output Summary (Last 24 hours) at 10/20/2017 0859 Last data filed at 10/20/2017 0447 Gross per 24 hour  Intake 840 ml  Output 2251 ml  Net -1411 ml   Physical Exam:  Blood pressure (!) 155/77, pulse (!) 57, temperature 98 F (36.7 C), temperature source Oral, resp. rate 17, height 5' (1.524 m), weight 72.5 kg (159 lb 13.3 oz), SpO2 100 %.  Pale, very pleasant Wearing O2 Ant coarse breath sounds Crackles at bases persistent Chest sternotomy scar R TDC catheter with dressing (placed 2/21) S1S2 NoS3 Abd soft not tender with pitting edema esp R abd wall LE  edema only trace Abrasions and scabs LE's   Recent Labs  Lab 10/14/17 0902 10/15/17 0606 10/16/17 0325 10/17/17 0243 10/18/17 0240 10/19/17 0721 10/20/17 0425  NA 134* 131* 133* 132* 133* 130* 131*  K 4.8 5.3* 4.7 4.7 4.4 5.2* 4.9  CL 94* 92* 92* 90* 95* 91* 92*  CO2 28 28 26 28 28 26 29   GLUCOSE 102* 101* 138* 95 80 92 94  BUN 43* 50* 55* 61* 34* 42* 44*  CREATININE 4.24* 4.48* 4.53* 4.65* 3.14* 3.32* 3.85*  CALCIUM 8.5* 8.9 8.7* 8.7* 8.4* 8.9 9.2  PHOS  4.4 4.5 5.7* 5.9* 3.8 3.6 4.6    Recent Labs  Lab 10/18/17 0240 10/19/17 0721 10/20/17 0425  ALBUMIN 2.7* 3.2* 3.0*   Recent Labs  Lab 10/16/17 0325 10/17/17 0243 10/19/17 0721  WBC 5.9 4.9 7.6  HGB 9.7* 9.3* 10.5*  HCT 31.1* 30.6* 33.2*  MCV 90.4 91.1 89.2  PLT 204 235 289    Recent Labs  Lab 10/19/17 1128 10/19/17 1553 10/19/17 2117 10/20/17 0444 10/20/17 0620  GLUCAP 97 140* 80 81 106*        Component Value Date/Time   IRON 17 (L) 09/30/2017 1435   TIBC 133 (L) 09/30/2017 1435   FERRITIN 87 09/30/2017 1435   IRONPCTSAT 13 (L) 09/30/2017 1435   IRONPCTSAT 8 (L) 12/13/2012 1554   Medications:  . amiodarone  200 mg Oral BID  . atorvastatin  80 mg Oral Daily  . chlorhexidine  15 mL Mouth Rinse BID  . darbepoetin  (ARANESP) injection - NON-DIALYSIS  150 mcg Subcutaneous Q Wed-1800  . feeding supplement (ENSURE ENLIVE)  237 mL Oral BID BM  . furosemide  80 mg Intravenous Q12H  . hydrocerin   Topical BID  . insulin aspart  0-20 Units Subcutaneous TID WC  . insulin glargine  14 Units Subcutaneous QHS  . mouth rinse  15 mL Mouth Rinse q12n4p  . multivitamin  1 tablet Oral QHS  . pantoprazole  40 mg Oral Daily  . PARoxetine  40 mg Oral Daily  . polyethylene glycol  17 g Oral Daily  . senna  1 tablet Oral BID  . senna-docusate  1 tablet Oral QHS  . Warfarin - Pharmacist Dosing Inpatient   Does not apply [phone]

## 2017-10-20 NOTE — Progress Notes (Addendum)
Subjective:  Patient seen sitting comfortably in bed this AM in no acute distress. Continues to feel well. No complaints.   Objective:  Vital signs in last 24 hours: Vitals:   10/19/17 0326 10/19/17 1346 10/19/17 2110 10/20/17 0425  BP: (!) 155/75 112/63 129/73 (!) 155/77  Pulse: 60 63  (!) 57  Resp: (!) 25 (!) 22 19 17   Temp: (!) 97.5 F (36.4 C) 97.8 F (36.6 C) 97.9 F (36.6 C) 98 F (36.7 C)  TempSrc: Oral Oral Oral Oral  SpO2: 96% 98% 99% 100%  Weight: 158 lb 15.2 oz (72.1 kg)   159 lb 13.3 oz (72.5 kg)  Height:       Physical Exam  Constitutional:  Appears older than stated age sitting comfortably in bed in no acute distress  Cardiovascular: Normal rate, regular rhythm and intact distal pulses. Exam reveals no friction rub.  No murmur heard. Respiratory:  No accessory muscle use or nasal flaring. Speaking in full sentences. Intermittent crackles throughout lower lung fields, unchanged from yesterday.   GI: Soft. Bowel sounds are normal. He exhibits no distension. There is no tenderness (mild, RLQ and LLQ). There is no rebound.  Musculoskeletal: He exhibits no edema or tenderness (of bilateral lower extremities).  Skin: Skin is warm and dry. No rash noted. No erythema.   Assessment/Plan:  Principal Problem:   Acute respiratory failure with hypoxia (HCC) Active Problems:   DM (diabetes mellitus), type 2, uncontrolled ([place])   Essential hypertension   Paroxysmal atrial fibrillation with rapid ventricular response (HCC)   COPD (chronic obstructive pulmonary disease) (HCC)   Chronic combined systolic and diastolic CHF (congestive heart failure) (HCC)   Ischemic cardiomyopathy   Right bundle branch block (RBBB) on electrocardiography   Tachy-brady syndrome (HCC)   Sinus pause   Influenza with respiratory manifestation   Aspiration pneumonia of both lower lobes (HCC)   AKI (acute kidney injury) ([place])   Pressure injury of skin   Anemia of chronic disease  Acute  on Chronic Renal Failure:On intermittent HD via tunneled dialysis cather placed on 2/25for volume overload, last session yesterday (8 day interval between last two dialysis sessions). Cr slowly elevating, 3.8 today from 3.3 yesterday. K+ elevated to 4.9 today. Nephrology has possible plan for discharge with intermittent HD as outpatient. Patient had 2.2 mL UOP recorded yesterday with Lasix 80 mg BID. Appears more euvolemic on exam with this diuresis regimen.  -Nephrology consulting, recommendations appreciated  -Repeat Renal panel in AM -Lasix 80 mg BID  Urinary retention: Foley placed in ICU, as patient developed acute urinary retention while acutely ill. Patient failed trial of void on 10/18/2017 and catheter replaced on 10/19/2017. Urine output not cloudy and without concern for infection.  -Continue to clinically monitor for signs/symptoms of infection  AcuteRespiratory Failure 2/2 recent pneumonia, influenza,and volume overload: Currently stable on room air at rest. Measure O2 with ambulation. -Incentive spirometry and flutter valve -Out of bed as able, appreciate assistance from nursing staff -Ambulate with O2  PAF with RVR, Tachy-Brady Syndrome: Patients heart rate stably in 50-60s. Cardiology consulted earlier in admission, patient to follow up as outpatient for pacemaker placement.  -Continue telemetry -Continue amiodarone 200mg  BID and Warfarin per pharmacy (last INR = 1.84) -Avoid metoprolol to prevent any bradycardia  DM: Recent CBGs continue to be appropriate.  -Continue current regimen of Lantus 14 units at bedtime withTID SSI at mealtime.  Sacral pressure ulcer, stage 2-3:  -Appreciate assistance from nursing staff with keeping area  clean, dry  FEN/GI: -Dysphagia 3 diet, no IVF -Pantoprazole 40 mg daily  VTE Prophylaxis:Heparin TID Code Status: Full  Dispo: Anticipated discharge pending renal improvement.  Thomasene Ripple, MD 10/20/2017, 6:39 AM Pager:  [phone]

## 2017-10-20 NOTE — Progress Notes (Signed)
Nutrition Follow-up  DOCUMENTATION CODES:   Obesity unspecified  INTERVENTION:    Ensure Enlive po BID, each supplement provides 350 kcal and 20 grams of protein  NUTRITION DIAGNOSIS:   Increased nutrient needs related to acute illness, wound healing as evidenced by estimated needs, ongoing   GOAL:   Patient will meet greater than or equal to 90% of their needs, progressing  MONITOR:   PO intake, Supplement acceptance, Labs, Weight trends, Skin  ASSESSMENT:   2/20 iHD started 2/21 Extubated 2/22 Diet advanced to Dysphagia II, Honey Thick 3/01 MBSS, diet advanced to Dys III, Nectar Thick  Pt continues on a Dysohagia 3 thin liquid diet. PO intake excellent at 100% per flowsheet records. Nephrology note reviewed. No HD today. May need long term. Labs and medications reviewed. Na 131 (L). CBG's 81-106-170.  Diet Order:  DIET DYS 3 Room service appropriate? Yes; Fluid consistency: Thin  EDUCATION NEEDS:   No education needs have been identified at this time  Skin:  Skin Assessment: Skin Integrity Issues: Skin Integrity Issues:: Other (Comment) DTI: sacrum Other: MASD: groin, buttocks, coccyx  Last BM:  3/14   Intake/Output Summary (Last 24 hours) at 10/20/2017 1307 Last data filed at 10/20/2017 1105 Gross per 24 hour  Intake 360 ml  Output 2550 ml  Net -2190 ml   Height:   Ht Readings from Last 1 Encounters:  09/25/17 5' (1.524 m)   Weight:   Wt Readings from Last 1 Encounters:  10/20/17 159 lb 13.3 oz (72.5 kg)   Ideal Body Weight:  48.1 kg  BMI:  Body mass index is 31.22 kg/m.  Estimated Nutritional Needs:   Kcal:  1900-2100  Protein:  100-115 gm  Fluid:  1000 ml plus UOP  Arthur Holms, RD, LDN Pager #: [phone] After-Hours Pager #: [phone]

## 2017-10-20 NOTE — Progress Notes (Signed)
Discussed proposed plan by nephrology for twice weekly HD with [place], who states that it is a possibility to discharge patient to SNF when ready and have him go to acute HD as they have contacts with an HD center for this. Arrangements will need to be made by the inpatient staff, SW and CM, however once discharged, [facility] will continue following patient and ensure HD transport, and f/u with nephrology.  Alphonzo Grieve, MD IMTS - PGY2 Pager [phone]

## 2017-10-20 NOTE — Progress Notes (Signed)
ANTICOAGULATION CONSULT NOTE - Follow Up Consult  Pharmacy Consult for Coumadin Indication: atrial fibrillation  Patient Measurements: Height: 5' (152.4 cm) Weight: 159 lb 13.3 oz (72.5 kg) IBW/kg (Calculated) : 50  Vital Signs: Temp: 98 F (36.7 C) (03/15 0425) Temp Source: Oral (03/15 0425) BP: 155/77 (03/15 0425) Pulse Rate: 57 (03/15 0425)  Labs: Recent Labs    10/18/17 0240 10/19/17 0721 10/20/17 0425  HGB  --  10.5*  --   HCT  --  33.2*  --   PLT  --  289  --   LABPROT 28.6* 23.8* 21.1*  INR 2.71 2.15 1.84  CREATININE 3.14* 3.32* 3.85*   Estimated Creatinine Clearance: 16.4 mL/min (A) (by C-G formula based on SCr of 3.85 mg/dL (H)).  Assessment:  55 yom on Xarelto 15mg  PTA for Afib (on med rec), not addressed initially in H&P by teaching service and prior Cards note says pt was NOT on anticoag 2/2 GIB in past.   INR slightly below goal: 1.84. Provider agrees to wait until tomorrow and access INR trend and determine if adding bridge therapy is appropriate. No noted bleeding complications identified.  He has a potential drug/drug interaction with Amiodarone but seems to be ok with current regimen.    Goal of Therapy:  INR 2-3 Monitor platelets by anticoagulation protocol: Yes   Plan:   Coumadin 4 mg today.  Daily PT/INR. Consider bridge therapy if INR continues to trend down  Georga Bora, PharmD Clinical Pharmacist 10/20/2017 10:03 AM

## 2017-10-21 LAB — RENAL FUNCTION PANEL
Albumin: 2.8 g/dL — ABNORMAL LOW (ref 3.5–5.0)
Anion gap: 10 (ref 5–15)
BUN: 52 mg/dL — ABNORMAL HIGH (ref 6–20)
CHLORIDE: 93 mmol/L — AB (ref 101–111)
CO2: 29 mmol/L (ref 22–32)
CREATININE: 3.68 mg/dL — AB (ref 0.61–1.24)
Calcium: 8.9 mg/dL (ref 8.9–10.3)
GFR calc non Af Amer: 16 mL/min — ABNORMAL LOW (ref >60)
GFR, EST AFRICAN AMERICAN: 19 mL/min — AB (ref >60)
Glucose, Bld: 118 mg/dL — ABNORMAL HIGH (ref 65–99)
Phosphorus: 4.2 mg/dL (ref 2.5–4.6)
Potassium: 5.4 mmol/L — ABNORMAL HIGH (ref 3.5–5.1)
Sodium: 132 mmol/L — ABNORMAL LOW (ref 135–145)

## 2017-10-21 LAB — GLUCOSE, CAPILLARY
GLUCOSE-CAPILLARY: 120 mg/dL — AB (ref 65–99)
Glucose-Capillary: 120 mg/dL — ABNORMAL HIGH (ref 65–99)
Glucose-Capillary: 201 mg/dL — ABNORMAL HIGH (ref 65–99)
Glucose-Capillary: 129 mg/dL — ABNORMAL HIGH (ref 65–99)

## 2017-10-21 LAB — PROTIME-INR
INR: 1.55
Prothrombin Time: 18.5 seconds — ABNORMAL HIGH (ref 11.4–15.2)

## 2017-10-21 MED ORDER — NEPRO/CARBSTEADY PO LIQD
237.0000 mL | Freq: Two times a day (BID) | ORAL | Status: DC
  Administered 2017-10-21 – 2017-10-26 (×5): 237 mL via ORAL

## 2017-10-21 MED ORDER — WARFARIN SODIUM 3 MG PO TABS
6.0000 mg | ORAL_TABLET | Freq: Once | ORAL | Status: AC
  Administered 2017-10-21: 6 mg via ORAL
  Filled 2017-10-21 (×3): qty 2

## 2017-10-21 NOTE — Progress Notes (Addendum)
ANTICOAGULATION CONSULT NOTE - Follow Up Consult  Pharmacy Consult for Coumadin Indication: atrial fibrillation  Patient Measurements: Height: 5' (152.4 cm) Weight: 163 lb 2.3 oz (74 kg) IBW/kg (Calculated) : 50  Vital Signs: Temp: 98 F (36.7 C) (03/16 0834) Temp Source: Oral (03/16 0834) BP: 149/54 (03/16 0834) Pulse Rate: 57 (03/16 0834)  Labs: Recent Labs    10/19/17 0721 10/20/17 0425 10/21/17 0243  HGB 10.5*  --   --   HCT 33.2*  --   --   PLT 289  --   --   LABPROT 23.8* 21.1* 18.5*  INR 2.15 1.84 1.55  CREATININE 3.32* 3.85* 3.68*   Estimated Creatinine Clearance: 17.3 mL/min (A) (by C-G formula based on SCr of 3.68 mg/dL (H)).  Assessment:  84 yom on Xarelto 15mg  PTA for Afib (on med rec), not addressed initially in H&P by teaching service and prior Cards note says pt was NOT on anticoag 2/2 GIB in past.  We have been consulted for Warfarin dosing and have been following for several days now.  3/16 INR 1.55 (down trend).  H/H stable on 3/14 - will recheck CBC in AM  Drug/Drug Interaction Potential:    A.  Amiodaron + Warfarin = potential inc. INR, bleeding risk  Goal of Therapy:  INR 2-3 Monitor platelets by anticoagulation protocol: Yes   Plan:   Coumadin 6 mg today.  Daily PT/INR.  CBC in AM  Rober Minion, PharmD., MS Clinical Pharmacist Pager:  [phone] Thank you for allowing pharmacy to be part of this patients care team. 10/21/2017 11:00 AM

## 2017-10-21 NOTE — Progress Notes (Addendum)
Subjective:  Patient seen sitting comfortably in bed this morning, about to eat breakfast. He denies shortness of breath, chest pain, or other complaints.  Objective:  Vital signs in last 24 hours: Vitals:   10/20/17 0425 10/20/17 1334 10/20/17 2045 10/21/17 0415  BP: (!) 155/77 122/64 126/73 (!) 148/62  Pulse: (!) 57 62 63 (!) 56  Resp: 17 19 (!) 25   Temp: 98 F (36.7 C) (!) 97.3 F (36.3 C) 98.3 F (36.8 C) 97.9 F (36.6 C)  TempSrc: Oral Oral Oral Oral  SpO2: 100% 99% 100% 98%  Weight: 159 lb 13.3 oz (72.5 kg)   163 lb 2.3 oz (74 kg)  Height:       Physical Exam: GEN: Sitting in bed in NAD. Alert and oriented. No acute distress.  RESP: Intermittent crackles in bilateral bases. No increased work of breathing, on RA. CV: Normal rate and regular rhythm. Systolic murmur, best heard at RUSB. No LE edema. ABD: Soft. Mild tenderness to palpation of bilateral lower quadrants, otherwise non-tender. Non-distended. Normoactive bowel sounds. EXT: No edema. Warm and well perfused. NEURO: Cranial nerves II-XII grossly intact. Able to lift all four extremities against gravity. No apparent audiovisual hallucinations. Speech fluent and appropriate. PSYCH: Patient is calm and pleasant. Appropriate affect. Well-groomed; speech is appropriate and on-subject.  Assessment/Plan:  Principal Problem:   Acute respiratory failure with hypoxia (HCC) Active Problems:   DM (diabetes mellitus), type 2, uncontrolled ([place])   Essential hypertension   Paroxysmal atrial fibrillation with rapid ventricular response (HCC)   COPD (chronic obstructive pulmonary disease) (HCC)   Chronic combined systolic and diastolic CHF (congestive heart failure) (HCC)   Ischemic cardiomyopathy   Right bundle branch block (RBBB) on electrocardiography   Tachy-brady syndrome (HCC)   Sinus pause   Influenza with respiratory manifestation   Aspiration pneumonia of both lower lobes ([place])   AKI (acute kidney injury)  ([place])   Pressure injury of skin   Anemia of chronic disease  [name] is a 62yo male with PMH of paroxysmal afib (on warfarin), HFrEF (TTE 09/2017: LVEF 40-45%), CAD (s/p CABG 2017), carotid stenosis (s/p L carotid endarterectomy), hx of CVA, COPD, HTN, and insulin-dependent diabetes who initially presented on 2/8 with generalized weakness, found to be in afib with RVR. His hospital course has been complicated by tachybrady syndrome, requiring temporary transvenous pacing wires and amiodarone drip with plans to implant permanent pacemaker. Unfortunately, he went into acute respiratory failure 2/2 influenza and aspiration pneumonia, requiring intubation 2/12-2/22 (completed course of tamiflu, linezolid, and cefepime). He was transferred back to our service on 2/22 and has been on oral amiodarone for afib without further recurrence of pauses or significant bradycardia. During his stay in the ICU, his course was further complicated by acute on chronic renal failure, requiring placement of temporary dialysis catheter and CRRT in ICU. He is now on intermittent HD and awaiting Nephrology recommendations on whether he may require long term dialysis. He has failed trial of void, requiring foley catheter.  Acute on Chronic Renal Failure On intermittent HD via tunneled dialysis cather placed on 2/25for volume overload, last session 3/12 (last session prior to that was 3/4). Cr slightly improved today from yesterday, 3.85 -> 3.68 this AM. Potassium elevated to 4.9 -> 5.4. Patient continues to diurese well on PO lasix 80mg  BID, with 1500cc UOP over last 24h. Nephrology has possible plan for discharge with intermittent HD as outpatient - possible twice weekly. Will need to discuss with SW and  CM. - Nephrology consulted; appreciate their recommendations - Appreciate SW and CM assistance - Dialysis, per Nephrology - RFP in AM - Continue PO lasix 80mg  BID  Urinary retention Foley placed in ICU, as patient  developed acute urinary retention while acutely ill. Patient failed trial of void on 10/18/2017 and catheter replaced on 10/19/2017. - Continue to clinically monitor for signs/symptoms of infection - Will need outpatient Urology f/u  AcuteRespiratory Failure 2/2 recent aspiration pneumonia, influenza,and volume overload, improved Satting well on RA at rest. Denies dyspnea or chest pain. - Incentive spirometry and flutter valve - Out of bed as able  PAF with RVR, Tachy-Brady Syndrome Currently on PO amiodarone and warfarin, with HR controlled in 50s-60s. - Continue telemetry - Continue amiodarone 200mg  BID - Continue warfarin per pharmacy (last INR 1.55) - Avoid metoprolol to prevent any bradycardia - Outpatient f/u with Cardiology for pacemaker placement  DM Stable - Continue Lantus 14 units QHS - Novolog SSI-R TID WC - CBG monitoring  Sacral pressure ulcer, stage 2-3 - Appreciate assistance from nursing staff with keeping area clean and dry  FEN/GI -Dysphagia 3 diet, no IVF -Pantoprazole 40 mg daily  VTE Prophylaxis:warfarin Code Status: Full  Dispo: Anticipated discharge pending arrangements for acute HD at SNF - SW, [place], and Nephrology consulted; appreciate their assistance - Coordinating with [facility] as well  Joseph Ewing, MD 10/21/2017, 7:08 AM Pager: [phone]

## 2017-10-21 NOTE — Progress Notes (Signed)
[facility] Rounding Note  Background:  62 yo male, admitted 2/9 w/AF RVR, temp pacer required 2/2 long pauses w/hypotension, Flu A +, hypoxic resp failure (required vent support). AKI in this setting (ATN rel shock/sepsis vs vanco), CRRT 2/14 - 2/18. IHD off/on since 09/27/17. Baseline creatinine appears to have been around 1.6-1.7 past year.   Assessment/Recommendations 1. AKI on CKD3 - shock/+/-vanco/sepsis related ATN. CRRT 2/14 - 2/18; HD has been required intermittently since 09/27/17. Now nearly 4 weeks into AKI.   HD 3/4 for volume mostly, next was 3/12 for clearance and that was his last. Lasix helping UOP,  lasix 80 Q12H PO/trend labs and exam. I still cannot say at this time if long term HD will be needed or not - crt down today with good UOP    No indications for HD today , unfortunately labs continue to worsen by the smallest amount daily/not improve so I am unable to say he is clear from needing more HD.  Will need to see if his insurance will pay for acute dialysis could maybe set up 2 times per week as OP as we continue to watch for improvement 2. PAF tachy/brady syndrome - amio/coumadin 3. S/p flu/PNA - post ATB's/tamiflu 4. Systolic HF - EF 93-71%. Making great urine- actually have decreased lasix -80 PO BID  5. Anemia on Aranesp 150 QWeek Last dosed 3/6. Had 2 doses Feraheme per pharmacy (2/23, 2/25)- better 6. DM per primary team 7. Dysphagia - D3 diet 8. Urinary retention - will need to be addressed at some point- foley replaced as he failed voiding trial 9. Hyperkalemia- change ensure to nepro- I think that will help   Shar Paez A 10/15/2017, 3:22 PM  Subjective/Interval History:  1575 of UOP  Objective  Vital signs in last 24 hours: Vitals:   10/20/17 1334 10/20/17 2045 10/21/17 0415 10/21/17 0834  BP: 122/64 126/73 (!) 148/62 (!) 149/54  Pulse: 62 63 (!) 56 (!) 57  Resp: 19 (!) 25  16  Temp: (!) 97.3 F (36.3 C) 98.3 F (36.8 C) 97.9 F (36.6 C) 98 F (36.7 C)   TempSrc: Oral Oral Oral Oral  SpO2: 99% 100% 98% 100%  Weight:   74 kg (163 lb 2.3 oz)   Height:       Weight change: 1.5 kg (3 lb 4.9 oz)  Intake/Output Summary (Last 24 hours) at 10/21/2017 1003 Last data filed at 10/21/2017 0907 Gross per 24 hour  Intake 720 ml  Output 1575 ml  Net -855 ml   Physical Exam:  Blood pressure (!) 149/54, pulse (!) 57, temperature 98 F (36.7 C), temperature source Oral, resp. rate 16, height 5' (1.524 m), weight 74 kg (163 lb 2.3 oz), SpO2 100 %.  Pale, very pleasant Wearing O2 Ant coarse breath sounds Crackles at bases persistent Chest sternotomy scar R TDC catheter with dressing (placed 2/21) S1S2 NoS3 Abd soft not tender with pitting edema esp R abd wall LE  edema only trace    Recent Labs  Lab 10/15/17 0606 10/16/17 0325 10/17/17 0243 10/18/17 0240 10/19/17 0721 10/20/17 0425 10/21/17 0243  NA 131* 133* 132* 133* 130* 131* 132*  K 5.3* 4.7 4.7 4.4 5.2* 4.9 5.4*  CL 92* 92* 90* 95* 91* 92* 93*  CO2 28 26 28 28 26 29 29   GLUCOSE 101* 138* 95 80 92 94 118*  BUN 50* 55* 61* 34* 42* 44* 52*  CREATININE 4.48* 4.53* 4.65* 3.14* 3.32* 3.85* 3.68*  CALCIUM 8.9 8.7* 8.7*  8.4* 8.9 9.2 8.9  PHOS 4.5 5.7* 5.9* 3.8 3.6 4.6 4.2    Recent Labs  Lab 10/19/17 0721 10/20/17 0425 10/21/17 0243  ALBUMIN 3.2* 3.0* 2.8*   Recent Labs  Lab 10/16/17 0325 10/17/17 0243 10/19/17 0721  WBC 5.9 4.9 7.6  HGB 9.7* 9.3* 10.5*  HCT 31.1* 30.6* 33.2*  MCV 90.4 91.1 89.2  PLT 204 235 289    Recent Labs  Lab 10/20/17 0620 10/20/17 1103 10/20/17 1554 10/20/17 2042 10/21/17 0604  GLUCAP 106* 170* 97 162* 120*        Component Value Date/Time   IRON 17 (L) 09/30/2017 1435   TIBC 133 (L) 09/30/2017 1435   FERRITIN 87 09/30/2017 1435   IRONPCTSAT 13 (L) 09/30/2017 1435   IRONPCTSAT 8 (L) 12/13/2012 1554   Medications:  . amiodarone  200 mg Oral BID  . atorvastatin  80 mg Oral Daily  . chlorhexidine  15 mL Mouth Rinse BID  .  darbepoetin (ARANESP) injection - NON-DIALYSIS  150 mcg Subcutaneous Q Wed-1800  . feeding supplement (ENSURE ENLIVE)  237 mL Oral BID BM  . furosemide  80 mg Oral BID  . hydrocerin   Topical BID  . insulin aspart  0-20 Units Subcutaneous TID WC  . insulin glargine  14 Units Subcutaneous QHS  . mouth rinse  15 mL Mouth Rinse q12n4p  . multivitamin  1 tablet Oral QHS  . pantoprazole  40 mg Oral Daily  . PARoxetine  40 mg Oral Daily  . polyethylene glycol  17 g Oral Daily  . senna  1 tablet Oral BID  . senna-docusate  1 tablet Oral QHS  . Warfarin - Pharmacist Dosing Inpatient   Does not apply [phone]

## 2017-10-22 DIAGNOSIS — Z8709 Personal history of other diseases of the respiratory system: Secondary | ICD-10-CM

## 2017-10-22 LAB — RENAL FUNCTION PANEL
Albumin: 2.8 g/dL — ABNORMAL LOW (ref 3.5–5.0)
Anion gap: 9 (ref 5–15)
BUN: 54 mg/dL — AB (ref 6–20)
CALCIUM: 8.9 mg/dL (ref 8.9–10.3)
CHLORIDE: 92 mmol/L — AB (ref 101–111)
CO2: 29 mmol/L (ref 22–32)
CREATININE: 3.55 mg/dL — AB (ref 0.61–1.24)
GFR calc Af Amer: 20 mL/min — ABNORMAL LOW (ref >60)
GFR, EST NON AFRICAN AMERICAN: 17 mL/min — AB (ref >60)
Glucose, Bld: 99 mg/dL (ref 65–99)
Phosphorus: 3.7 mg/dL (ref 2.5–4.6)
Potassium: 5.4 mmol/L — ABNORMAL HIGH (ref 3.5–5.1)
SODIUM: 130 mmol/L — AB (ref 135–145)

## 2017-10-22 LAB — CBC
HCT: 32.2 % — ABNORMAL LOW (ref 39.0–52.0)
Hemoglobin: 10.1 g/dL — ABNORMAL LOW (ref 13.0–17.0)
MCH: 28.3 pg (ref 26.0–34.0)
MCHC: 31.4 g/dL (ref 30.0–36.0)
MCV: 90.2 fL (ref 78.0–100.0)
Platelets: 269 10*3/uL (ref 150–400)
RBC: 3.57 MIL/uL — ABNORMAL LOW (ref 4.22–5.81)
RDW: 18.9 % — AB (ref 11.5–15.5)
WBC: 5 10*3/uL (ref 4.0–10.5)

## 2017-10-22 LAB — GLUCOSE, CAPILLARY
GLUCOSE-CAPILLARY: 107 mg/dL — AB (ref 65–99)
Glucose-Capillary: 108 mg/dL — ABNORMAL HIGH (ref 65–99)
Glucose-Capillary: 159 mg/dL — ABNORMAL HIGH (ref 65–99)
Glucose-Capillary: 76 mg/dL (ref 65–99)

## 2017-10-22 LAB — PROTIME-INR
INR: 1.49
PROTHROMBIN TIME: 17.9 s — AB (ref 11.4–15.2)

## 2017-10-22 LAB — TROPONIN I: Troponin I: <0.03 ng/mL (ref <0.03)

## 2017-10-22 MED ORDER — WARFARIN SODIUM 7.5 MG PO TABS
7.5000 mg | ORAL_TABLET | Freq: Once | ORAL | Status: AC
  Administered 2017-10-22: 7.5 mg via ORAL
  Filled 2017-10-22: qty 1

## 2017-10-22 MED ORDER — FUROSEMIDE 40 MG PO TABS
40.0000 mg | ORAL_TABLET | Freq: Two times a day (BID) | ORAL | Status: DC
  Administered 2017-10-22 – 2017-10-23 (×2): 40 mg via ORAL
  Filled 2017-10-22 (×3): qty 1

## 2017-10-22 MED ORDER — COUMADIN BOOK
Freq: Once | Status: AC
  Administered 2017-10-22: 16:00:00
  Filled 2017-10-22: qty 1

## 2017-10-22 MED ORDER — PATIROMER SORBITEX CALCIUM 8.4 G PO PACK
8.4000 g | PACK | Freq: Once | ORAL | Status: AC
  Administered 2017-10-22: 8.4 g via ORAL
  Filled 2017-10-22: qty 4

## 2017-10-22 NOTE — Progress Notes (Signed)
INTERNAL MEDICINE INTERVAL PROGRESS NOTE  Paged by Su Grand at approximately 620 pm that patient was experiencing 8/10 CP during a period of bradycardia to low 50's. EKG obtained and he was given 1 SL nitro with improvement to 7/10. His blood pressure became soft following its administration however improved within a few minutes. On arrival to room both RN Jenny Reichmann and RR Bonnita Nasuti at bedside.   Initial EKG reviewed: EKG with sinus bradycardia, known RBBB, known TWI in aVR, V1 and V2 VSS: BP 126/65, pulse 53 and was saturating 98% on RA. Exam: Patient was in no distress and was resting in bed comfortably. He appears pale, but this is usual for him. He was not diaphoretic or SOB. Had faint crackles at BL bases but was breathing comfortably on RA which seems consistent with evaluation by day-team. Bradycardic to 60's but has SSS and has been ranging from 50-60 throughout the day. No significant lower extremity swelling.   Plan: He looks very comfortable on examination and I doubt ACS. Given his complicated history, will trend troponins x3 with awareness that he has baseline trop elevation of about 0.12. This may be somewhat elevated given his renal function. I am also ordering repeat EKG later on this evening to look for any new ischemic changes. I would avoid further nitro unless needed given his drop in BP. Will continue patient on telemetry monitoring.

## 2017-10-22 NOTE — Progress Notes (Signed)
Subjective:  Patient seen sitting comfortably in bed this morning. Denies shortness of breath, chest pain, or other complaints. No dysuria or other urinary symptoms.   Objective:  Vital signs in last 24 hours: Vitals:   10/21/17 0834 10/21/17 1628 10/21/17 1929 10/22/17 0408  BP: (!) 149/54 123/62 (!) 130/58 123/68  Pulse: (!) 57 80  (!) 55  Resp: 16 (!) 24 18 18   Temp: 98 F (36.7 C) 97.8 F (36.6 C) 98.3 F (36.8 C) 98.2 F (36.8 C)  TempSrc: Oral Oral Oral Oral  SpO2: 100% 98% 98% 100%  Weight:    167 lb 1.7 oz (75.8 kg)  Height:       Physical Exam: GEN: Sitting in bed in NAD. Alert and oriented. No acute distress.  RESP: Intermittent crackles in bilateral bases. No increased work of breathing, on RA. CV: Normal rate and regular rhythm. Systolic murmur, best heard at RUSB. No LE edema. ABD: Soft. Mild tenderness to palpation of bilateral lower quadrants, otherwise non-tender. Non-distended. Normoactive bowel sounds. EXT: No edema. Warm and well perfused. NEURO: Cranial nerves II-XII grossly intact. Able to lift all four extremities against gravity. No apparent audiovisual hallucinations. Speech fluent and appropriate. PSYCH: Patient is calm and pleasant. Appropriate affect. Well-groomed; speech is appropriate and on-subject.  Assessment/Plan:  Principal Problem:   Acute respiratory failure with hypoxia (HCC) Active Problems:   DM (diabetes mellitus), type 2, uncontrolled ([place])   Essential hypertension   Paroxysmal atrial fibrillation with rapid ventricular response (HCC)   COPD (chronic obstructive pulmonary disease) (HCC)   Chronic combined systolic and diastolic CHF (congestive heart failure) (HCC)   Ischemic cardiomyopathy   Right bundle branch block (RBBB) on electrocardiography   Tachy-brady syndrome (HCC)   Sinus pause   Influenza with respiratory manifestation   Aspiration pneumonia of both lower lobes ([place])   AKI (acute kidney injury) ([place])   Pressure  injury of skin   Anemia of chronic disease  [name] is a 62yo male with PMH of paroxysmal afib (on warfarin), HFrEF (TTE 09/2017: LVEF 40-45%), CAD (s/p CABG 2017), carotid stenosis (s/p L carotid endarterectomy), hx of CVA, COPD, HTN, and insulin-dependent diabetes who initially presented on 2/8 with generalized weakness, found to be in afib with RVR. His hospital course has been complicated by tachybrady syndrome, requiring temporary transvenous pacing wires and amiodarone drip with plans to implant permanent pacemaker. Unfortunately, he went into acute respiratory failure 2/2 influenza and aspiration pneumonia, requiring intubation 2/12-2/22 (completed course of tamiflu, linezolid, and cefepime). He was transferred back to our service on 2/22 and has been on oral amiodarone for afib without further recurrence of pauses or significant bradycardia. During his stay in the ICU, his course was further complicated by acute on chronic renal failure, requiring placement of temporary dialysis catheter and CRRT in ICU. He is now on intermittent HD and awaiting Nephrology recommendations on whether he may require long term dialysis. He has failed trial of void, requiring foley catheter.  Acute on Chronic Renal Failure On intermittent HD via tunneled dialysis cather placed on 2/25for volume overload, last session 3/12 (last session prior to that was 3/4). Cr slightly improved today from yesterday, 3.68 -> 3.55 this AM. Potassium elevated to 5.4. Patient continues to diurese well on PO lasix 80mg  BID, with 2100cc UOP over last 24h. Nephrology has possible plan for discharge with intermittent HD as outpatient - possibly twice weekly. Will need to discuss with SW and CM. - Nephrology consulted; appreciate their  recommendations - Appreciate SW and CM assistance - Dialysis, per Nephrology - RFP in AM - Continue PO lasix 80mg  BID - I/Os  Urinary retention Foley placed in ICU, as patient developed acute urinary  retention while acutely ill. Patient failed trial of void on 10/18/2017 and catheter replaced on 10/19/2017. - Continue to clinically monitor for signs/symptoms of infection - Will need outpatient Urology f/u  AcuteRespiratory Failure 2/2 recent aspiration pneumonia, influenza,and volume overload, improved Satting well on RA at rest. Denies dyspnea or chest pain. - Incentive spirometry and flutter valve - Out of bed as able  PAF with RVR, Tachy-Brady Syndrome Currently on PO amiodarone and warfarin, with HR controlled in 50s-60s. - Continue telemetry - Continue amiodarone 200mg  BID - Continue warfarin per pharmacy (last INR 1.49) - Avoid metoprolol to prevent any bradycardia - Outpatient f/u with Cardiology for pacemaker placement  DM Stable. CBG 100s-120s. - Continue Lantus 14 units QHS - Novolog SSI-R TID WC - CBG monitoring  Sacral pressure ulcer, stage 2-3 - Appreciate assistance from nursing staff with keeping area clean and dry  FEN/GI - Dysphagia 3 diet, no IVF - Pantoprazole 40 mg daily  VTE Prophylaxis:Warfarin Code Status: Full  Dispo: Anticipated discharge pending arrangements for acute HD at SNF - SW, CM, and Nephrology consulted; appreciate their assistance - Coordinating with [facility] as well - Will touch base with [facility] on Monday  Colbert Ewing, MD 10/22/2017, 6:56 AM Pager: [phone]

## 2017-10-22 NOTE — Progress Notes (Signed)
1805 Pt complaining of new chest pain. Describes as 8/10 and sharp in L anterior chest. Clammy to touch.  1809 HR sustained 50 - earlier in the day was high 50s. BP 136/79. T 97.7. O2 100% on room air. EKG obtained  1813 First IM number paged  Of note: Pt states normally takes nitro at home when this happens. Often takes 2 nitro. Uses nitro about once a week at home.   1815 First nitro tab given 1820 pain is 7/10 1819 RRT called 1822 Second IM number paged  1823 BP 107/60. Call returned from IM - they are looking at EKG and will call back.  1830 pain is 6/10, pt complains of being sweaty/hot 1832 BP 126/65 HR 52 1833 Second nitro tab given 1837 pain is 6/10 1838 BP is 106/61 HR is 54  1848 Third Nitro tab given 1850 pain 5/10 1853 BP is 106/62 HR 56  Put Patient on 2L of O2  Patient feeling nausea, gave him 4mg  of Zofran. B/P119/60  Cecilio Asper, RN

## 2017-10-22 NOTE — Progress Notes (Signed)
ANTICOAGULATION CONSULT NOTE - Follow Up Consult  Pharmacy Consult for Coumadin Indication: atrial fibrillation  Patient Measurements: Height: 5' (152.4 cm) Weight: 167 lb 1.7 oz (75.8 kg) IBW/kg (Calculated) : 50  Vital Signs: Temp: 97.5 F (36.4 C) (03/17 0952) Temp Source: Oral (03/17 0952) BP: 161/69 (03/17 0952) Pulse Rate: 55 (03/17 0408)  Labs: Recent Labs    10/20/17 0425 10/21/17 0243 10/22/17 0302  HGB  --   --  10.1*  HCT  --   --  32.2*  PLT  --   --  269  LABPROT 21.1* 18.5* 17.9*  INR 1.84 1.55 1.49  CREATININE 3.85* 3.68* 3.55*   Estimated Creatinine Clearance: 18.2 mL/min (A) (by C-G formula based on SCr of 3.55 mg/dL (H)).  Assessment:  8 yom with Afib and we were consulted for warfarin dosing.  He is not responding as expected and INR continues to trend down.  His H/H and platelets are stable and no noted acute bleeding complications.  He has some anemia present but appears stable.   3/16 INR 1.55 (down trend).   3/17 INR 1.49 - even though dose was increased yesterday  Drug/Drug Interaction Potential:    A.  Amiodaron + Warfarin = potential inc. INR, bleeding risk  Goal of Therapy:  INR 2-3 Monitor platelets by anticoagulation protocol: Yes   Plan:   Coumadin 7.5 mg today x 1  Daily PT/INR.  CBC in AM  Rober Minion, PharmD., MS Clinical Pharmacist Pager:  [phone] Thank you for allowing pharmacy to be part of this patients care team. 10/22/2017 10:58 AM

## 2017-10-22 NOTE — Progress Notes (Signed)
Patient complaining of pain in his penile area. Patient is afebrile, VS are stable and he is alert and oriented X4. Current output amount 578mL, urine color is yellow, clear and no odor. Patient states he is having sharpe pains. Called and notified of patient condition.

## 2017-10-22 NOTE — Progress Notes (Signed)
[facility] Rounding Note  Background:  62 yo male, admitted 2/9 w/AF RVR, temp pacer required 2/2 long pauses w/hypotension, Flu A +, hypoxic resp failure (required vent support). AKI in this setting (ATN rel shock/sepsis vs vanco), CRRT 2/14 - 2/18. IHD off/on since 09/27/17. Baseline creatinine appears to have been around 1.6-1.7 past year.   Assessment/Recommendations 1. AKI on CKD3 - shock/+/-vanco/sepsis related ATN. CRRT 2/14 - 2/18; HD has been required intermittently since 09/27/17. Now nearly 4 weeks into AKI.   HD 3/4 for volume mostly, next was 3/12 for clearance and that was his last. Lasix helping UOP,  lasix 80 Q12H PO/trend labs and exam. I still cannot say at this time if long term HD will be needed or not - crt down again today with good UOP- maybe finally turning corner !   No indications for HD today ,  I am unable to say he is clear from needing more HD but is looking better.  If he needs another tx this hosp will need to look into HD for AKI as OP 2. PAF tachy/brady syndrome - amio/coumadin 3. S/p flu/PNA - post ATB's/tamiflu 4. Systolic HF - EF 33-29%. Making great urine-  decrease lasix again to 40 PO BID  5. Anemia on Aranesp 150 QWeek Last dosed 3/6. Had 2 doses Feraheme per pharmacy (2/23, 2/25)- better 6. DM per primary team  7. Urinary retention - will need to be addressed at some point- foley replaced this week as he failed voiding trial 8. Hyperkalemia- changed ensure to nepro yest- Give one dose veltassa today  Deloma Spindle A 10/15/2017, 3:22 PM  Subjective/Interval History:  2100  of UOP  Objective  Vital signs in last 24 hours: Vitals:   10/21/17 0834 10/21/17 1628 10/21/17 1929 10/22/17 0408  BP: (!) 149/54 123/62 (!) 130/58 123/68  Pulse: (!) 57 80  (!) 55  Resp: 16 (!) 24 18 18   Temp: 98 F (36.7 C) 97.8 F (36.6 C) 98.3 F (36.8 C) 98.2 F (36.8 C)  TempSrc: Oral Oral Oral Oral  SpO2: 100% 98% 98% 100%  Weight:    75.8 kg (167 lb 1.7 oz)   Height:       Weight change: 1.8 kg (3 lb 15.5 oz)  Intake/Output Summary (Last 24 hours) at 10/22/2017 0909 Last data filed at 10/22/2017 5188 Gross per 24 hour  Intake 1080 ml  Output 2175 ml  Net -1095 ml   Physical Exam:  Blood pressure 123/68, pulse (!) 55, temperature 98.2 F (36.8 C), temperature source Oral, resp. rate 18, height 5' (1.524 m), weight 75.8 kg (167 lb 1.7 oz), SpO2 100 %.  Pale, very pleasant Wearing O2 Ant coarse breath sounds Crackles at bases persistent Chest sternotomy scar R TDC catheter with dressing (placed 2/21) S1S2 NoS3 Abd soft not tender with pitting edema esp R abd wall LE  edema only trace    Recent Labs  Lab 10/16/17 0325 10/17/17 0243 10/18/17 0240 10/19/17 0721 10/20/17 0425 10/21/17 0243 10/22/17 0302  NA 133* 132* 133* 130* 131* 132* 130*  K 4.7 4.7 4.4 5.2* 4.9 5.4* 5.4*  CL 92* 90* 95* 91* 92* 93* 92*  CO2 26 28 28 26 29 29 29   GLUCOSE 138* 95 80 92 94 118* 99  BUN 55* 61* 34* 42* 44* 52* 54*  CREATININE 4.53* 4.65* 3.14* 3.32* 3.85* 3.68* 3.55*  CALCIUM 8.7* 8.7* 8.4* 8.9 9.2 8.9 8.9  PHOS 5.7* 5.9* 3.8 3.6 4.6 4.2 3.7  Recent Labs  Lab 10/20/17 0425 10/21/17 0243 10/22/17 0302  ALBUMIN 3.0* 2.8* 2.8*   Recent Labs  Lab 10/16/17 0325 10/17/17 0243 10/19/17 0721 10/22/17 0302  WBC 5.9 4.9 7.6 5.0  HGB 9.7* 9.3* 10.5* 10.1*  HCT 31.1* 30.6* 33.2* 32.2*  MCV 90.4 91.1 89.2 90.2  PLT 204 235 289 269    Recent Labs  Lab 10/21/17 0604 10/21/17 1119 10/21/17 1626 10/21/17 2111 10/22/17 0616  GLUCAP 120* 201* 120* 129* 107*        Component Value Date/Time   IRON 17 (L) 09/30/2017 1435   TIBC 133 (L) 09/30/2017 1435   FERRITIN 87 09/30/2017 1435   IRONPCTSAT 13 (L) 09/30/2017 1435   IRONPCTSAT 8 (L) 12/13/2012 1554   Medications:  . amiodarone  200 mg Oral BID  . atorvastatin  80 mg Oral Daily  . chlorhexidine  15 mL Mouth Rinse BID  . darbepoetin (ARANESP) injection - NON-DIALYSIS  150  mcg Subcutaneous Q Wed-1800  . feeding supplement (NEPRO CARB STEADY)  237 mL Oral BID BM  . furosemide  80 mg Oral BID  . hydrocerin   Topical BID  . insulin aspart  0-20 Units Subcutaneous TID WC  . insulin glargine  14 Units Subcutaneous QHS  . mouth rinse  15 mL Mouth Rinse q12n4p  . multivitamin  1 tablet Oral QHS  . pantoprazole  40 mg Oral Daily  . PARoxetine  40 mg Oral Daily  . polyethylene glycol  17 g Oral Daily  . senna  1 tablet Oral BID  . senna-docusate  1 tablet Oral QHS  . Warfarin - Pharmacist Dosing Inpatient   Does not apply [phone]

## 2017-10-22 NOTE — Progress Notes (Signed)
Internal Medicine Attending:   I saw and examined the patient. I reviewed the resident's note and I agree with the resident's findings and plan as documented in the resident's note.  Patient feels well today with no new complaints.  Nephrology follow-up and recommendations appreciated.  Patient with slightly improved creatinine today and 2100 cc urine output over the last 24 hours.  Patient does not require hemodialysis urgently at this time.  We will continue to monitor for now.  If he does require an additional hemodialysis treatment during this hospitalization he may need to be set up for intermittent hemodialysis as an outpatient.  Continue with Lasix for now.

## 2017-10-22 NOTE — Progress Notes (Signed)
Patient with cp 8/10, sharp pain Left chest, and a little clammy.  Total of 3 SL NTG given.  Patient with decreased BP post NTG but then rebounded.  12 lead EKG done.  Resident at bedside to assess patient. Orders placed for serial Troponin and repeat EKG this evening.  CP improved to 5/10, patient relaxed in the bed watching TV.  Placed patient on 2L [place].  RN to call if assistance needed.

## 2017-10-23 DIAGNOSIS — E871 Hypo-osmolality and hyponatremia: Secondary | ICD-10-CM

## 2017-10-23 DIAGNOSIS — R079 Chest pain, unspecified: Secondary | ICD-10-CM

## 2017-10-23 DIAGNOSIS — N4889 Other specified disorders of penis: Secondary | ICD-10-CM

## 2017-10-23 DIAGNOSIS — R10815 Periumbilic abdominal tenderness: Secondary | ICD-10-CM

## 2017-10-23 DIAGNOSIS — I129 Hypertensive chronic kidney disease with stage 1 through stage 4 chronic kidney disease, or unspecified chronic kidney disease: Secondary | ICD-10-CM

## 2017-10-23 LAB — GLUCOSE, CAPILLARY
GLUCOSE-CAPILLARY: 90 mg/dL (ref 65–99)
GLUCOSE-CAPILLARY: 53 mg/dL — AB (ref 65–99)
GLUCOSE-CAPILLARY: 153 mg/dL — AB (ref 65–99)
GLUCOSE-CAPILLARY: 104 mg/dL — AB (ref 65–99)
Glucose-Capillary: 128 mg/dL — ABNORMAL HIGH (ref 65–99)
Glucose-Capillary: 49 mg/dL — ABNORMAL LOW (ref 65–99)
Glucose-Capillary: 50 mg/dL — ABNORMAL LOW (ref 65–99)
Glucose-Capillary: 64 mg/dL — ABNORMAL LOW (ref 65–99)

## 2017-10-23 LAB — BASIC METABOLIC PANEL
ANION GAP: 13 (ref 5–15)
BUN: 56 mg/dL — ABNORMAL HIGH (ref 6–20)
CALCIUM: 9.1 mg/dL (ref 8.9–10.3)
CO2: 25 mmol/L (ref 22–32)
Chloride: 90 mmol/L — ABNORMAL LOW (ref 101–111)
Creatinine, Ser: 3.36 mg/dL — ABNORMAL HIGH (ref 0.61–1.24)
GFR, EST AFRICAN AMERICAN: 21 mL/min — AB (ref >60)
GFR, EST NON AFRICAN AMERICAN: 18 mL/min — AB (ref >60)
GLUCOSE: 51 mg/dL — AB (ref 65–99)
Potassium: 4.9 mmol/L (ref 3.5–5.1)
Sodium: 128 mmol/L — ABNORMAL LOW (ref 135–145)

## 2017-10-23 LAB — RENAL FUNCTION PANEL
Albumin: 3 g/dL — ABNORMAL LOW (ref 3.5–5.0)
Anion gap: 16 — ABNORMAL HIGH (ref 5–15)
BUN: 57 mg/dL — ABNORMAL HIGH (ref 6–20)
CHLORIDE: 91 mmol/L — AB (ref 101–111)
CO2: 21 mmol/L — AB (ref 22–32)
CREATININE: 3.41 mg/dL — AB (ref 0.61–1.24)
Calcium: 8.9 mg/dL (ref 8.9–10.3)
GFR calc Af Amer: 21 mL/min — ABNORMAL LOW (ref >60)
GFR, EST NON AFRICAN AMERICAN: 18 mL/min — AB (ref >60)
GLUCOSE: 86 mg/dL (ref 65–99)
POTASSIUM: 5.5 mmol/L — AB (ref 3.5–5.1)
Phosphorus: 5 mg/dL — ABNORMAL HIGH (ref 2.5–4.6)
SODIUM: 128 mmol/L — AB (ref 135–145)

## 2017-10-23 LAB — PROTIME-INR
INR: 1.7
PROTHROMBIN TIME: 19.8 s — AB (ref 11.4–15.2)

## 2017-10-23 LAB — TROPONIN I
Troponin I: <0.03 ng/mL (ref <0.03)
Troponin I: <0.03 ng/mL (ref <0.03)

## 2017-10-23 MED ORDER — LOPERAMIDE HCL 2 MG PO CAPS
2.0000 mg | ORAL_CAPSULE | ORAL | Status: DC | PRN
  Administered 2017-10-23: 2 mg via ORAL
  Filled 2017-10-23: qty 1

## 2017-10-23 MED ORDER — DEXTROSE 50 % IV SOLN
1.0000 | Freq: Once | INTRAVENOUS | Status: AC
  Administered 2017-10-23: 50 mL via INTRAVENOUS
  Filled 2017-10-23: qty 50

## 2017-10-23 MED ORDER — WARFARIN SODIUM 7.5 MG PO TABS
7.5000 mg | ORAL_TABLET | Freq: Once | ORAL | Status: AC
  Administered 2017-10-23: 7.5 mg via ORAL
  Filled 2017-10-23: qty 1

## 2017-10-23 MED ORDER — INSULIN ASPART 100 UNIT/ML IV SOLN
10.0000 [IU] | Freq: Once | INTRAVENOUS | Status: AC
  Administered 2017-10-23: 10 [IU] via INTRAVENOUS

## 2017-10-23 MED ORDER — SODIUM POLYSTYRENE SULFONATE 15 GM/60ML PO SUSP
15.0000 g | Freq: Once | ORAL | Status: DC
  Filled 2017-10-23: qty 60

## 2017-10-23 MED ORDER — OXYBUTYNIN CHLORIDE 5 MG PO TABS: 2.5000 mg | ORAL_TABLET | Freq: Two times a day (BID) | ORAL | Status: DC | PRN

## 2017-10-23 MED ORDER — FUROSEMIDE 80 MG PO TABS
80.0000 mg | ORAL_TABLET | Freq: Two times a day (BID) | ORAL | Status: DC
  Administered 2017-10-23 – 2017-10-24 (×2): 80 mg via ORAL
  Filled 2017-10-23 (×2): qty 1

## 2017-10-23 NOTE — Progress Notes (Signed)
CSW received call from [place] with [facility] for updates on HD clip and SNF placement. Tentative plan is for patient to discharge to [facility], though awaiting clip now. CSW to follow and support with discharge when ready.  [name], [place]

## 2017-10-23 NOTE — Progress Notes (Signed)
Internal Medicine Attending:   I saw and examined the patient. I reviewed the resident's note and I agree with the resident's findings and plan as documented in the resident's note. Continues to have good UOP. Episode of chest pain last night, troponin trend was negative, received 3 doses of SL nitro- only mild improvement last night but chest pain resolved this AM. He does report about 2-3 similar episodes/year at home. Other issues: Hyperkalemia, received veltassa last night, currently also staking oral Lasix Hyponatremia: hypervolemic, limit oral intake, continue lasix Dispo: awaiting renal recovery

## 2017-10-23 NOTE — Progress Notes (Signed)
Background:  62 yo male, admitted 2/9 w/AF RVR, temp pacer required 2/2 long pauses w/hypotension, Flu A +, s/p VDRA. AKI in this setting (ATN rel shock/sepsis vs vanco), CRRT 2/14 - 2/18. IHD since 09/27/17. Baseline creatinine around 1.6-1.7 past year.   Assessment/Recommendations 1. AKI on CKD3 - shock/+/-vanco/sepsis related ATN. CRRT 2/14 - 2/18; HD last on  3/12. I think we are in recovery phase. 2. PAF tachy/brady syndrome - amio/coumadin 3. S/p flu/PNA - post ATB's/tamiflu 4. Systolic HF - EF 41-63%. Making urine- 5. Anemia s/p Feraheme per pharmacy (2/23, 2/25)-on Aranesp 6. DM per primary team 7. Urinary retention - - foley previously replaced as he failed voiding trial 8. Hyperkalemia- Hyponatremia--limit PO fluids if needed and give kayexalate  Subjective: Interval History: Good UOP  Objective: Vital signs in last 24 hours: Temp:  [97.6 F (36.4 C)-98.1 F (36.7 C)] 97.6 F (36.4 C) (03/18 0746) Pulse Rate:  [51-58] 57 (03/18 0413) Resp:  [13-22] 22 (03/18 0413) BP: (85-172)/(53-79) 145/63 (03/18 0746) SpO2:  [97 %-100 %] 100 % (03/18 0413) Weight:  [74.3 kg (163 lb 12.8 oz)] 74.3 kg (163 lb 12.8 oz) (03/18 0217) Weight change: -1.5 kg (-4.9 oz)  Intake/Output from previous day: 03/17 0701 - 03/18 0700 In: 942 [P.O.:942] Out: 1677 [Urine:1675; Stool:2] Intake/Output this shift: No intake/output data recorded.  General appearance: alert and cooperative Chest wall: no tenderness, right TDC Cardio: regular rate and rhythm, S1, S2 normal, no murmur, click, rub or gallop GI: soft, non-tender; bowel sounds normal; no masses,  no organomegaly Extremities: edema tr  Lab Results: Recent Labs    10/22/17 0302  WBC 5.0  HGB 10.1*  HCT 32.2*  PLT 269   BMET:  Recent Labs    10/22/17 0302 10/23/17 0042  NA 130* 128*  K 5.4* 5.5*  CL 92* 91*  CO2 29 21*  GLUCOSE 99 86  BUN 54* 57*  CREATININE 3.55* 3.41*  CALCIUM 8.9 8.9   No results for input(s): PTH in  the last 72 hours. Iron Studies: No results for input(s): IRON, TIBC, TRANSFERRIN, FERRITIN in the last 72 hours. Studies/Results: No results found.  Scheduled: . amiodarone  200 mg Oral BID  . atorvastatin  80 mg Oral Daily  . chlorhexidine  15 mL Mouth Rinse BID  . darbepoetin (ARANESP) injection - NON-DIALYSIS  150 mcg Subcutaneous Q Wed-1800  . feeding supplement (NEPRO CARB STEADY)  237 mL Oral BID BM  . furosemide  40 mg Oral BID  . hydrocerin   Topical BID  . insulin aspart  0-20 Units Subcutaneous TID WC  . insulin glargine  14 Units Subcutaneous QHS  . mouth rinse  15 mL Mouth Rinse q12n4p  . multivitamin  1 tablet Oral QHS  . pantoprazole  40 mg Oral Daily  . PARoxetine  40 mg Oral Daily  . polyethylene glycol  17 g Oral Daily  . senna  1 tablet Oral BID  . senna-docusate  1 tablet Oral QHS  . warfarin  7.5 mg Oral ONCE-1800  . Warfarin - Pharmacist Dosing Inpatient   Does not apply q1800    LOS: 38 days   Estanislado Emms 10/23/2017,10:47 AM

## 2017-10-23 NOTE — Progress Notes (Signed)
ANTICOAGULATION CONSULT NOTE - Follow Up Consult  Pharmacy Consult for Coumadin Indication: atrial fibrillation  Patient Measurements: Height: 5' (152.4 cm) Weight: 163 lb 12.8 oz (74.3 kg) IBW/kg (Calculated) : 50  Vital Signs: Temp: 97.6 F (36.4 C) (03/18 0746) Temp Source: Oral (03/18 0746) BP: 145/63 (03/18 0746) Pulse Rate: 57 (03/18 0413)  Labs: Recent Labs    10/21/17 0243 10/22/17 0302 10/22/17 1955 10/23/17 0042 10/23/17 0644  HGB  --  10.1*  --   --   --   HCT  --  32.2*  --   --   --   PLT  --  269  --   --   --   LABPROT 18.5* 17.9*  --   --  19.8*  INR 1.55 1.49  --   --  1.70  CREATININE 3.68* 3.55*  --  3.41*  --   TROPONINI  --   --  <0.03 <0.03 <0.03   Estimated Creatinine Clearance: 18.7 mL/min (A) (by C-G formula based on SCr of 3.41 mg/dL (H)).  Assessment: 67 yom with Afib and Pharmacy consulted for warfarin dosing.  H/H and platelets are stable and no noted acute bleeding complications documented. INR starting to increase with increased doses, remains subtherapeutic at 1.7.  Drug/Drug Interaction Potential:    Amiodarone + Warfarin = potential inc. INR, bleeding risk  Goal of Therapy:  INR 2-3 Monitor platelets by anticoagulation protocol: Yes   Plan:  Coumadin 7.5 mg PO x 1 again tonight Monitor daily INR, CBC, s/sx bleeding, DDI  Elicia Lamp, PharmD, BCPS Clinical Pharmacist Clinical phone for 10/23/2017 until 3:30pm: [phone] If after 3:30pm, please call main pharmacy at: [phone] 10/23/2017 10:02 AM

## 2017-10-23 NOTE — Progress Notes (Addendum)
Subjective:  Patient seen lying comfortably in bed this morning. Denies shortness of breath, current chest pain, or other complaints. No dysuria or other urinary symptoms.  Had an episode of chest pain last night. He reports that this happens 1-2 times per year and usually resolves with 1 NTG. Last night's episode lasted longer and required 3 NTG. Troponin and EKG unremarkable. He also reported sharp penile pain and suprapubic tenderness yesterday. No swelling or lesions noted, and able to flush foley easily. Had some diarrhea, but was given veltassa for elevated potassium yesterday.  Objective:  Vital signs in last 24 hours: Vitals:   10/22/17 1951 10/23/17 0217 10/23/17 0413 10/23/17 0746  BP: (!) 161/67  (!) 172/65 (!) 145/63  Pulse: (!) 55  (!) 57   Resp: 19  (!) 22   Temp: 98 F (36.7 C)  97.7 F (36.5 C) 97.6 F (36.4 C)  TempSrc: Oral  Oral Oral  SpO2: 100%  100%   Weight:  163 lb 12.8 oz (74.3 kg)    Height:       Physical Exam: GEN: Sitting in bed in NAD. Alert and oriented. No acute distress.  RESP: Intermittent crackles in bilateral bases. No increased work of breathing, on RA. CV: Normal rate and regular rhythm. Systolic murmur, best heard at RUSB. No LE edema. ABD: Soft. Mild suprapubic tenderness to palpation, otherwise non-tender. Non-distended. Normoactive bowel sounds. GU: No penile swelling or rashes, foley in place. EXT: No edema. Warm and well perfused. NEURO: Cranial nerves II-XII grossly intact. Able to lift all four extremities against gravity. No apparent audiovisual hallucinations. Speech fluent and appropriate. PSYCH: Patient is calm and pleasant. Appropriate affect. Well-groomed; speech is appropriate and on-subject.  Assessment/Plan:  Principal Problem:   Acute respiratory failure with hypoxia (HCC) Active Problems:   DM (diabetes mellitus), type 2, uncontrolled ([place])   Essential hypertension   Paroxysmal atrial fibrillation with rapid  ventricular response (HCC)   COPD (chronic obstructive pulmonary disease) (HCC)   Chronic combined systolic and diastolic CHF (congestive heart failure) (HCC)   Ischemic cardiomyopathy   Right bundle branch block (RBBB) on electrocardiography   Tachy-brady syndrome (HCC)   Sinus pause   Influenza with respiratory manifestation   Aspiration pneumonia of both lower lobes ([place])   AKI (acute kidney injury) ([place])   Pressure injury of skin   Anemia of chronic disease  [name] is a 62yo male with PMH of paroxysmal afib (on warfarin), HFrEF (TTE 09/2017: LVEF 40-45%), CAD (s/p CABG 2017), carotid stenosis (s/p L carotid endarterectomy), hx of CVA, COPD, HTN, and insulin-dependent diabetes who initially presented on 2/8 with generalized weakness, found to be in afib with RVR. His hospital course has been complicated by tachybrady syndrome, requiring temporary transvenous pacing wires and amiodarone drip with plans to implant permanent pacemaker. Unfortunately, he went into acute respiratory failure 2/2 influenza and aspiration pneumonia, requiring intubation 2/12-2/22 (completed course of tamiflu, linezolid, and cefepime). He was transferred back to our service on 2/22 and has been on oral amiodarone for afib without further recurrence of pauses or significant bradycardia. During his stay in the ICU, his course was further complicated by acute on chronic renal failure, requiring placement of temporary dialysis catheter and CRRT in ICU. He is now on intermittent HD and awaiting Nephrology recommendations on whether he may require long term dialysis. He has failed trial of void, requiring foley catheter.  Acute on Chronic Renal Failure On intermittent HD via tunneled dialysis cather placed  on 2/25for volume overload, last session 3/12 (last session prior to that was 3/4). Cr slightly improved today from yesterday, 3.55 -> 3.41 this AM. Potassium elevated to 5.5 and sodium decreased to 128. Had diarrhea  yesterday, probably related to veltassa. Patient continues to diurese well, now on PO lasix 40mg  BID, with 1600cc UOP over last 24h. Discussed with Nephrology, who feel he needs to remain inpatient to continue monitoring for renal recovery or long-term dialysis. - Nephrology consulted; appreciate their recommendations - Appreciate SW and CM assistance - Intermittent dialysis, per Nephrology - RFP in AM - Increase to PO lasix 80mg  BID - I/Os  Hyperkalemia K 5.5 this AM. Patient was given Veltassa yesterday and reportedly had 2 hours of diarrhea. Kayexalate ordered by Nephrology for today, however patient refuses to take this because it will cause him diarrhea. - Novolog 10u x1 + 1 amp of D50 - Increase lasix to 80mg  BID  Urinary retention Foley placed in ICU, as patient developed acute urinary retention while acutely ill. Patient failed trial of void on 10/18/2017 and catheter replaced on 10/19/2017. Had an episode of sharp penile pain, no swelling or lesions noted. Denies this currently. Possible bladder spasm with catheter in place. - Continue to clinically monitor for signs/symptoms of infection - Will need outpatient Urology f/u - Oxybutynin 2.5mg  BID PRN for bladder spasms  Chest pain Episode of chest pain last night that was eventually relieved by 3 SL NTG. Vitals stable during the episode. EKG and troponin reassuring. Possibly related to volume overload? - Continue to monitor  AcuteRespiratory Failure 2/2 recent aspiration pneumonia, influenza,and volume overload, improved Satting well on RA at rest. Denies dyspnea or chest pain. - Incentive spirometry and flutter valve - Out of bed as able  PAF with RVR, Tachy-Brady Syndrome Currently on PO amiodarone and warfarin, with HR controlled in 50s-60s. - Continue telemetry - Continue amiodarone 200mg  BID - Continue warfarin per pharmacy (last INR 1.70) - Avoid metoprolol to prevent any bradycardia - Outpatient f/u with Cardiology  for pacemaker placement  DM Stable. CBG 70s-90s. - Continue Lantus 14 units QHS - Novolog SSI-R TID WC - CBG monitoring  Sacral pressure ulcer, stage 2-3 - Appreciate assistance from nursing staff with keeping area clean and dry  FEN/GI - Dysphagia 3 diet, no IVF - Pantoprazole 40 mg daily  VTE Prophylaxis:Warfarin Code Status: Full  Dispo: Anticipated discharge pending renal recovery or need for dialysis  Colbert Ewing, MD 10/23/2017, 8:07 AM Pager: [phone]

## 2017-10-23 NOTE — Progress Notes (Signed)
Physical Therapy Treatment Patient Details Name: [name] MRN: [id number] DOB: [birth year] Today's Date: 10/23/2017    History of Present Illness Pt is a 62 y.o. male admitted 09/15/17 with generalized weakness, cough, and dizziness; found to be back in a-fib with rapid rate. S/p temporary pacemaker placement 2/8; electrophysiology avoiding PPM implant due to pt's co-morbidities. Intubated 2/12-2/21. Also with acute on chronic renal failure; s/p R IJ tunnelled dialysis cath placement 10/02/17. PMH includes CHF, CAD (s/p CABG 2017), carotid stenosis (s/p L carotid endarterectomy), CVA, COPD, HTN, DM, intellectual disability.     PT Comments    Pt reports he if feeling weak but willing to participate with therapy. He is mod A for all mobilities. +2 required for safety with transfers and gait as pt sits impulsively and without warning. He ambulated 4 ft with mod A before requiring seated rest break. Pt would benefit from continued skilled PT to increase activity tolerance and safety with mobility. Will continue to follow acutely.    Follow Up Recommendations  SNF;Supervision/Assistance - 24 hour     Equipment Recommendations  None recommended by PT    Recommendations for Other Services       Precautions / Restrictions Precautions Precautions: Fall Precaution Comments: s/p RIJ tunnelled cath 2/25 Restrictions Weight Bearing Restrictions: No    Mobility  Bed Mobility Overal bed mobility: Needs Assistance Bed Mobility: Supine to Sit     Supine to sit: Mod assist     General bed mobility comments: Use of bed rails and HHA to elevate trunk. Mod A to progress hips forward.  Transfers Overall transfer level: Needs assistance Equipment used: Rolling walker (2 wheeled) Transfers: Stand Pivot Transfers Sit to Stand: Min assist;+2 safety/equipment Stand pivot transfers: Min assist;+2 safety/equipment      Lateral/Scoot Transfers: (reliant on bed pad to assist with  transfer) General transfer comment: Pt performed sit<>stand x2 with min A +2 to rise into standing and cues for technique. Pt sits impulsively, without warnding.  Ambulation/Gait Ambulation/Gait assistance: Mod assist Ambulation Distance (Feet): 4 Feet Assistive device: Rolling walker (2 wheeled) Gait Pattern/deviations: Step-to pattern;Decreased stride length;Antalgic;Trunk flexed Gait velocity: decreased Gait velocity interpretation: Below normal speed for age/gender General Gait Details: Pt with unsteady gait and will sit without warning. Chair close behind for safety. Mod A to steady with ambulation.   Stairs            Wheelchair Mobility    Modified Rankin (Stroke Patients Only)       Balance Overall balance assessment: Needs assistance Sitting-balance support: Bilateral upper extremity supported;Feet unsupported Sitting balance-Leahy Scale: Fair       Standing balance-Leahy Scale: Poor Standing balance comment: reliant on therapist support                             Cognition Arousal/Alertness: Awake/alert Behavior During Therapy: WFL for tasks assessed/performed Overall Cognitive Status: History of cognitive impairments - at baseline                         Following Commands: Follows one step commands consistently Safety/Judgement: Decreased awareness of deficits;Decreased awareness of safety            Exercises      General Comments        Pertinent Vitals/Pain Pain Assessment: No/denies pain Pain Intervention(s): Monitored during session    Home Living  Prior Function            PT Goals (current goals can now be found in the care plan section) Acute Rehab PT Goals Patient Stated Goal: Get stronger PT Goal Formulation: With patient Time For Goal Achievement: 10/17/17 Potential to Achieve Goals: Good Progress towards PT goals: Progressing toward goals    Frequency    Min  2X/week      PT Plan Current plan remains appropriate    Co-evaluation              AM-PAC PT "6 Clicks" Daily Activity  Outcome Measure  Difficulty turning over in bed (including adjusting bedclothes, sheets and blankets)?: Unable Difficulty moving from lying on back to sitting on the side of the bed? : Unable Difficulty sitting down on and standing up from a chair with arms (e.g., wheelchair, bedside commode, etc,.)?: Unable Help needed moving to and from a bed to chair (including a wheelchair)?: A Lot Help needed walking in hospital room?: A Lot Help needed climbing 3-5 steps with a railing? : Total 6 Click Score: 8    End of Session Equipment Utilized During Treatment: Gait belt Activity Tolerance: Patient tolerated treatment well Patient left: in bed;with call bell/phone within reach Nurse Communication: Mobility status PT Visit Diagnosis: Other abnormalities of gait and mobility (R26.89);Muscle weakness (generalized) (M62.81);Difficulty in walking, not elsewhere classified (R26.2)     Time: 1444-1500 PT Time Calculation (min) (ACUTE ONLY): 16 min  Charges:  $Therapeutic Activity: 8-22 mins                    G Codes:       Benjiman Core, [place] Pager [phone] Acute Rehab   Allena Katz 10/23/2017, 3:29 PM

## 2017-10-24 DIAGNOSIS — E8770 Fluid overload, unspecified: Secondary | ICD-10-CM

## 2017-10-24 LAB — RENAL FUNCTION PANEL
ANION GAP: 13 (ref 5–15)
Albumin: 2.8 g/dL — ABNORMAL LOW (ref 3.5–5.0)
BUN: 55 mg/dL — ABNORMAL HIGH (ref 6–20)
CHLORIDE: 89 mmol/L — AB (ref 101–111)
CO2: 24 mmol/L (ref 22–32)
Calcium: 8.7 mg/dL — ABNORMAL LOW (ref 8.9–10.3)
Creatinine, Ser: 3.32 mg/dL — ABNORMAL HIGH (ref 0.61–1.24)
GFR, EST AFRICAN AMERICAN: 21 mL/min — AB (ref >60)
GFR, EST NON AFRICAN AMERICAN: 18 mL/min — AB (ref >60)
Glucose, Bld: 115 mg/dL — ABNORMAL HIGH (ref 65–99)
PHOSPHORUS: 4.7 mg/dL — AB (ref 2.5–4.6)
POTASSIUM: 5.2 mmol/L — AB (ref 3.5–5.1)
Sodium: 126 mmol/L — ABNORMAL LOW (ref 135–145)

## 2017-10-24 LAB — GLUCOSE, CAPILLARY
GLUCOSE-CAPILLARY: 98 mg/dL (ref 65–99)
GLUCOSE-CAPILLARY: 99 mg/dL (ref 65–99)
GLUCOSE-CAPILLARY: 218 mg/dL — AB (ref 65–99)
Glucose-Capillary: 182 mg/dL — ABNORMAL HIGH (ref 65–99)
Glucose-Capillary: 131 mg/dL — ABNORMAL HIGH (ref 65–99)
Glucose-Capillary: 123 mg/dL — ABNORMAL HIGH (ref 65–99)

## 2017-10-24 LAB — PROTIME-INR
INR: 2.04
PROTHROMBIN TIME: 22.8 s — AB (ref 11.4–15.2)

## 2017-10-24 MED ORDER — FUROSEMIDE 10 MG/ML IJ SOLN
80.0000 mg | Freq: Two times a day (BID) | INTRAMUSCULAR | Status: AC
  Administered 2017-10-24: 80 mg via INTRAVENOUS
  Filled 2017-10-24: qty 8

## 2017-10-24 MED ORDER — WARFARIN SODIUM 7.5 MG PO TABS
7.5000 mg | ORAL_TABLET | Freq: Once | ORAL | Status: AC
  Administered 2017-10-24: 7.5 mg via ORAL
  Filled 2017-10-24: qty 1

## 2017-10-24 MED ORDER — LIDOCAINE HCL (PF) 1 % IJ SOLN
INTRAMUSCULAR | Status: AC
  Filled 2017-10-24: qty 30

## 2017-10-24 MED ORDER — INSULIN ASPART 100 UNIT/ML ~~LOC~~ SOLN
0.0000 [IU] | Freq: Three times a day (TID) | SUBCUTANEOUS | Status: DC
  Administered 2017-10-24 – 2017-10-26 (×4): 2 [IU] via SUBCUTANEOUS

## 2017-10-24 NOTE — Progress Notes (Signed)
ANTICOAGULATION CONSULT NOTE - Follow Up Consult  Pharmacy Consult for Coumadin Indication: atrial fibrillation  Patient Measurements: Height: 5' (152.4 cm) Weight: 165 lb 9.6 oz (75.1 kg) IBW/kg (Calculated) : 50  Vital Signs: Temp: 97.5 F (36.4 C) (03/19 0825) Temp Source: Oral (03/19 0825) BP: 117/65 (03/19 0825) Pulse Rate: 51 (03/19 0825)  Labs: Recent Labs    10/22/17 0302 10/22/17 1955 10/23/17 0042 10/23/17 0644 10/23/17 1627 10/24/17 0236  HGB 10.1*  --   --   --   --   --   HCT 32.2*  --   --   --   --   --   PLT 269  --   --   --   --   --   LABPROT 17.9*  --   --  19.8*  --  22.8*  INR 1.49  --   --  1.70  --  2.04  CREATININE 3.55*  --  3.41*  --  3.36* 3.32*  TROPONINI  --  <0.03 <0.03 <0.03  --   --    Estimated Creatinine Clearance: 19.3 mL/min (A) (by C-G formula based on SCr of 3.32 mg/dL (H)).  Assessment: 32 yom with Afib and Pharmacy consulted for warfarin dosing.  H/H and platelets are stable and no noted acute bleeding complications documented. INR trending up nicely, therapeutic today at 2.04.  Drug/Drug Interaction Potential:  Amiodarone + Warfarin = potential inc. INR, bleeding risk  Goal of Therapy:  INR 2-3 Monitor platelets by anticoagulation protocol: Yes   Plan:  Coumadin 7.5 mg PO x 1 again tonight Monitor daily INR, CBC, s/sx bleeding, DDI  Elicia Lamp, PharmD, BCPS Clinical Pharmacist Clinical phone for 10/24/2017 until 3:30pm: [phone] If after 3:30pm, please call main pharmacy at: [phone] 10/24/2017 10:14 AM

## 2017-10-24 NOTE — Progress Notes (Addendum)
Subjective:  Patient seen lying comfortably in bed this morning. Denies shortness of breath, current chest pain, or dysuria. Discussed with patient that we will need to continue inpatient monitoring for his renal function - he is agreeable to this plan.  Objective:  Vital signs in last 24 hours: Vitals:   10/23/17 0746 10/23/17 1156 10/23/17 2020 10/24/17 0342  BP: (!) 145/63 (!) 164/77 (!) 129/55 (!) 156/58  Pulse:   (!) 49 (!) 48  Resp:   (!) 22 18  Temp: 97.6 F (36.4 C)  (!) 97.5 F (36.4 C) 98.1 F (36.7 C)  TempSrc: Oral  Oral Oral  SpO2:   100% 99%  Weight:    165 lb 9.6 oz (75.1 kg)  Height:       Physical Exam: GEN: Sitting in bed in NAD. Alert and oriented. No acute distress.  RESP: Intermittent crackles in bilateral bases. No increased work of breathing, on RA. CV: Normal rate and regular rhythm. Systolic murmur, best heard at RUSB. No LE edema. ABD: Soft. Non-tender. Non-distended. Normoactive bowel sounds. GU: Foley in place. EXT: No edema. Warm and well perfused. NEURO: Cranial nerves II-XII grossly intact. Able to lift all four extremities against gravity. No apparent audiovisual hallucinations. Speech fluent and appropriate. PSYCH: Patient is calm and pleasant. Appropriate affect. Well-groomed; speech is appropriate and on-subject.  Assessment/Plan:  Principal Problem:   Acute respiratory failure with hypoxia (HCC) Active Problems:   DM (diabetes mellitus), type 2, uncontrolled ([place])   Essential hypertension   Paroxysmal atrial fibrillation with rapid ventricular response (HCC)   COPD (chronic obstructive pulmonary disease) (HCC)   Chronic combined systolic and diastolic CHF (congestive heart failure) (HCC)   Ischemic cardiomyopathy   Right bundle branch block (RBBB) on electrocardiography   Tachy-brady syndrome (HCC)   Sinus pause   Influenza with respiratory manifestation   Aspiration pneumonia of both lower lobes ([place])   AKI (acute kidney injury)  ([place])   Pressure injury of skin   Anemia of chronic disease   Hyponatremia  [name] is a 62yo male with PMH of paroxysmal afib (on warfarin), HFrEF (TTE 09/2017: LVEF 40-45%), CAD (s/p CABG 2017), carotid stenosis (s/p L carotid endarterectomy), hx of CVA, COPD, HTN, and insulin-dependent diabetes who initially presented on 2/8 with generalized weakness, found to be in afib with RVR. His hospital course has been complicated by tachybrady syndrome, requiring temporary transvenous pacing wires and amiodarone drip with plans to implant permanent pacemaker. Unfortunately, he went into acute respiratory failure 2/2 influenza and aspiration pneumonia, requiring intubation 2/12-2/22 (completed course of tamiflu, linezolid, and cefepime). He was transferred back to our service on 2/22 and has been on oral amiodarone for afib without further recurrence of pauses or significant bradycardia. During his stay in the ICU, his course was further complicated by acute on chronic renal failure, requiring placement of temporary dialysis catheter and CRRT in ICU. He is now on intermittent HD and awaiting renal recovery vs need for long term dialysis. He has failed trial of void, requiring foley catheter.  Acute on Chronic Renal Failure On intermittent HD via tunneled dialysis cather placed on 2/25for volume overload, last session 3/12 (last session prior to that was 3/4). Cr continues to improve very slowly, 3.41 -> 3.32 this AM. Potassium elevated to 5.2 (received insulin+D50 yesterday) and sodium decreased to 126, likely hypervolemic hyponatremia. Patient continues to diurese well, increased back to PO lasix 80mg  BID yesterday, with 1300cc UOP over last 24h. Discussed with Nephrology,  who feel he needs to remain inpatient to continue monitoring for renal recovery or long-term dialysis. - Nephrology consulted; appreciate their recommendations - Intermittent dialysis, per Nephrology - RFP in AM - Will change PO lasix  to IV lasix to help with electrolyte derangements - 1500cc fluid restriction - I/Os - Contacted [facility] (spoke to RN who would pass message along to PCP Dr. Bradd Burner) to update them on current plan  Hyperkalemia K 5.5 -> 5.2 this AM after insulin + D50 yesterday. Patient refused Kayexalate due to not wanting to have diarrhea. - Will switch to IV lasix 80mg  BID - RFP in AM  Urinary retention Foley placed in ICU, as patient developed acute urinary retention while acutely ill. Patient failed trial of void on 10/18/2017 and catheter replaced on 10/19/2017. - Continue to clinically monitor for signs/symptoms of infection - Will need outpatient Urology f/u - Oxybutynin 2.5mg  BID PRN for bladder spasms  Chest pain, resolved - Continue to monitor  AcuteRespiratory Failure 2/2 recent aspiration pneumonia, influenza,and volume overload, improved Satting well on RA at rest. Denies dyspnea or chest pain. - Incentive spirometry and flutter valve - Out of bed as able  PAF with RVR, Tachy-Brady Syndrome Currently on PO amiodarone and warfarin, with HR controlled in 50s-60s. - Continue telemetry - Continue amiodarone 200mg  BID - Continue warfarin per pharmacy (last INR 2.04) - Avoid metoprolol to prevent any bradycardia - Outpatient f/u with Cardiology for pacemaker placement  DM Stable. CBG 90s-110s. - Continue Lantus 14 units QHS - Novolog SSI-R TID WC - CBG monitoring  Sacral pressure ulcer, stage 2-3 - Appreciate assistance from nursing staff with keeping area clean and dry  FEN/GI - Dysphagia 3 diet, no IVF - Pantoprazole 40 mg daily  VTE Prophylaxis:Warfarin Code Status: Full  Dispo: Anticipated discharge pending renal recovery or need for long-term dialysis  Colbert Ewing, MD 10/24/2017, 6:52 AM Pager: [phone]

## 2017-10-24 NOTE — Progress Notes (Signed)
Background:  62 yo male, admitted 2/9 w/AF RVR, temp pacer required 2/2 long pauses w/hypotension, Flu A +, s/p VDRA. AKI in this setting (ATN rel shock/sepsis vs vanco), CRRT 2/14 - 2/18. IHD since 09/27/17. Baseline creatinine around 1.6-1.7 past year.   Assessment/Recommendations 1. AKI on CKD3 - shock/+/-vanco/sepsis related ATN. CRRT 2/14 - 2/18; HD last on  3/12. I think we are in recovery phase. 2. PAF tachy/brady syndrome - amio/coumadin 3. S/p flu/PNA - post ATB's/tamiflu 4. Systolic HF - EF 63-87%. Making urine- 5. Anemia s/p Feraheme per pharmacy (2/23, 2/25)-on Aranesp 6. DM per primary team 7. Urinary retention - - foley previously replacedas he failed voiding trial 8. Hyperkalemia- Hyponatremia--limit PO fluids if needed and give kayexalate prn  Recs: He needs aggressive fluid restriction for the hyponatremia  Subjective: Interval History: Enjoys coffee and water, but poorly recorded  Objective: Vital signs in last 24 hours: Temp:  [97.5 F (36.4 C)-98.1 F (36.7 C)] 97.5 F (36.4 C) (03/19 0825) Pulse Rate:  [48-51] 51 (03/19 0825) Resp:  [15-22] 15 (03/19 0825) BP: (117-164)/(55-77) 117/65 (03/19 0825) SpO2:  [99 %-100 %] 99 % (03/19 0825) Weight:  [75.1 kg (165 lb 9.6 oz)] 75.1 kg (165 lb 9.6 oz) (03/19 0342) Weight change: 0.816 kg (1 lb 12.8 oz)  Intake/Output from previous day: 03/18 0701 - 03/19 0700 In: 640 [P.O.:640] Out: 1381 [Urine:1380; Stool:1] Intake/Output this shift: Total I/O In: 240 [P.O.:240] Out: -   General appearance: alert and cooperative Resp: clear to auscultation bilaterally Chest wall: no tenderness Cardio: regular rate and rhythm, S1, S2 normal, no murmur, click, rub or gallop  Lab Results: Recent Labs    10/22/17 0302  WBC 5.0  HGB 10.1*  HCT 32.2*  PLT 269   BMET:  Recent Labs    10/23/17 1627 10/24/17 0236  NA 128* 126*  K 4.9 5.2*  CL 90* 89*  CO2 25 24  GLUCOSE 51* 115*  BUN 56* 55*  CREATININE 3.36*  3.32*  CALCIUM 9.1 8.7*   No results for input(s): PTH in the last 72 hours. Iron Studies: No results for input(s): IRON, TIBC, TRANSFERRIN, FERRITIN in the last 72 hours. Studies/Results: No results found.  Scheduled: . amiodarone  200 mg Oral BID  . atorvastatin  80 mg Oral Daily  . chlorhexidine  15 mL Mouth Rinse BID  . darbepoetin (ARANESP) injection - NON-DIALYSIS  150 mcg Subcutaneous Q Wed-1800  . feeding supplement (NEPRO CARB STEADY)  237 mL Oral BID BM  . furosemide  80 mg Oral BID  . hydrocerin   Topical BID  . insulin aspart  0-20 Units Subcutaneous TID WC  . insulin glargine  14 Units Subcutaneous QHS  . mouth rinse  15 mL Mouth Rinse q12n4p  . multivitamin  1 tablet Oral QHS  . pantoprazole  40 mg Oral Daily  . PARoxetine  40 mg Oral Daily  . polyethylene glycol  17 g Oral Daily  . senna  1 tablet Oral BID  . senna-docusate  1 tablet Oral QHS  . sodium polystyrene  15 g Oral Once  . warfarin  7.5 mg Oral ONCE-1800  . Warfarin - Pharmacist Dosing Inpatient   Does not apply q1800     LOS: 39 days   [name] 10/24/2017,11:34 AM

## 2017-10-24 NOTE — Progress Notes (Signed)
Inpatient Diabetes Program Recommendations  AACE/ADA: New Consensus Statement on Inpatient Glycemic Control (2015)  Target Ranges:  Prepandial:   less than 140 mg/dL      Peak postprandial:   less than 180 mg/dL (1-2 hours)      Critically ill patients:  140 - 180 mg/dL   Lab Results  Component Value Date   GLUCAP 218 (H) 10/24/2017   HGBA1C 7.9 (H) 09/28/2017    Review of Glycemic Control Results for [name], [name] (MRN [id number]) as of 10/24/2017 09:30  Ref. Range 10/23/2017 16:52 10/23/2017 16:54 10/23/2017 17:10 10/23/2017 17:25 10/23/2017 17:55  Glucose-Capillary Latest Ref Range: 65 - 99 mg/dL 50 (L) 49 (L) 53 (L) 64 (L) 153 (H)  Results for [name], [name] (MRN [id number]) as of 10/24/2017 09:30  Ref. Range 10/23/2017 21:23 10/24/2017 03:54 10/24/2017 06:14 10/24/2017 08:52  Glucose-Capillary Latest Ref Range: 65 - 99 mg/dL 104 (H) 98 99 218 (H)   Diabetes history: Type 2 DM Outpatient Diabetes medications: Lantus 10 units QHS Current orders for Inpatient glycemic control: Novolog 0-20 units TID, Lantus 14 units QHS  Inpatient Diabetes Program Recommendations:    Noted patient's hypoglycemic episode on 3/18. This was following the administration of Novolog 13 units with a BS of 128 mg/dL. This was related to patient refusal of Kayexalate, however patient still received the Novolog 10 units X1, thus causing a low.   BS looks okay this AM. Would recommend decreasing Novolog correction to moderate 0-15 units TIDAC.   Thanks, Bronson Curb, MSN, RNC-OB Diabetes Coordinator [phone] (8a-5p)

## 2017-10-24 NOTE — Progress Notes (Signed)
Occupational Therapy Treatment Patient Details Name: [name] MRN: [id number] DOB: 1956-03-18 Today's Date: 10/24/2017    History of present illness Pt is a 62 y.o. male admitted 09/15/17 with generalized weakness, cough, and dizziness; found to be back in a-fib with rapid rate. S/p temporary pacemaker placement 2/8; electrophysiology avoiding PPM implant due to pt's co-morbidities. Intubated 2/12-2/21. Also with acute on chronic renal failure; s/p R IJ tunnelled dialysis cath placement 10/02/17. PMH includes CHF, CAD (s/p CABG 2017), carotid stenosis (s/p L carotid endarterectomy), CVA, COPD, HTN, DM, intellectual disability.    OT comments  Pt progressing towards OT goals, presents supine in bed and with encouragement agreeable to tx session. Pt completing bed mobility with MinA, completing UB/LB exercises seated EOB with min verbal cues for technique and close minguard for static sitting. Pt completed sit<>stand at RW and few small side steps along EOB with overall ModA throughout. Feel SNF recommendation remains appropriate at this time. Will continue to follow acutely to progress pt towards established OT goals.   Follow Up Recommendations  SNF;Supervision/Assistance - 24 hour    Equipment Recommendations  Wheelchair (measurements OT);Wheelchair cushion (measurements OT);3 in 1 bedside commode;Other (comment)          Precautions / Restrictions Precautions Precautions: Fall Precaution Comments: s/p RIJ tunnelled cath 2/25 Restrictions Weight Bearing Restrictions: No       Mobility Bed Mobility Overal bed mobility: Needs Assistance Bed Mobility: Supine to Sit;Sit to Supine Rolling: Min assist         General bed mobility comments: Use of bed rails and assist to elevate trunk. Assist for bil LEs when returning to supine with pt able to self assist using UEs to scoot towards [facility]   Transfers Overall transfer level: Needs assistance Equipment used: Rolling walker (2  wheeled) Transfers: Sit to/from Stand Sit to Stand: Mod assist         General transfer comment: completing sit<>stand x2 from EOB with ModA to rise and steady, verbal cues for hand placement, pt continues to sit impulsively     Balance Overall balance assessment: Needs assistance Sitting-balance support: Bilateral upper extremity supported;Feet unsupported Sitting balance-Leahy Scale: Fair Sitting balance - Comments: tends to intermittently lean strongly to R/posterior direction  Postural control: Right lateral lean   Standing balance-Leahy Scale: Poor Standing balance comment: reliant on therapist support and UE support                            ADL either performed or assessed with clinical judgement   ADL Overall ADL's : Needs assistance/impaired                                       General ADL Comments: pt completing bed mobility, seated UB/LB exercise, and sit<>stand at RW with ModA x2, taking small side steps along EOB (approx 3 steps) prior to return to sitting and supine                        Cognition Arousal/Alertness: Awake/alert Behavior During Therapy: WFL for tasks assessed/performed Overall Cognitive Status: History of cognitive impairments - at baseline                         Following Commands: Follows one step commands consistently Safety/Judgement: Decreased awareness of deficits;Decreased awareness  of safety Awareness: Emergent   General Comments: pt overall consistently following one step commands with increased time and cues         Exercises Other Exercises Other Exercises: Bil UE ROM exercises in multiple planes seated EOB for 5-10 reps each, altnerating between L/R UE Other Exercises: Bil UE exercise using of level 2 (orange) theraband  Other Exercises: Bil LE leg extension for 10 reps each LE                 Pertinent Vitals/ Pain       Pain Assessment: No/denies pain                                                           Frequency  Min 2X/week        Progress Toward Goals  OT Goals(current goals can now be found in the care plan section)  Progress towards OT goals: Progressing toward goals  Acute Rehab OT Goals Patient Stated Goal: Get stronger OT Goal Formulation: With patient Time For Goal Achievement: 10/19/17 Potential to Achieve Goals: [place] Discharge plan remains appropriate;Frequency remains appropriate    AM-PAC PT "6 Clicks" Daily Activity     Outcome Measure   Help from another person eating meals?: A Little Help from another person taking care of personal grooming?: A Little Help from another person toileting, which includes using toliet, bedpan, or urinal?: Total Help from another person bathing (including washing, rinsing, drying)?: A Lot Help from another person to put on and taking off regular upper body clothing?: A Lot Help from another person to put on and taking off regular lower body clothing?: Total 6 Click Score: 12    End of Session Equipment Utilized During Treatment: Gait belt;Rolling walker  OT Visit Diagnosis: Unsteadiness on feet (R26.81);Other abnormalities of gait and mobility (R26.89);Muscle weakness (generalized) (M62.81);Other symptoms and signs involving cognitive function;Adult, failure to thrive (R62.7)   Activity Tolerance Patient tolerated treatment well   Patient Left in bed;with call bell/phone within reach   Nurse Communication Mobility status        Time: 5573-2202 OT Time Calculation (min): 27 min  Charges: OT General Charges $OT Visit: 1 Visit OT Treatments $Therapeutic Activity: 23-37 mins  Lou Cal, [place] Pager [phone] 10/24/2017   [name] 10/24/2017, 4:46 PM

## 2017-10-25 ENCOUNTER — Inpatient Hospital Stay (HOSPITAL_COMMUNITY): Payer: Medicare (Managed Care)

## 2017-10-25 ENCOUNTER — Encounter (HOSPITAL_COMMUNITY): Payer: Self-pay | Admitting: Physician Assistant

## 2017-10-25 HISTORY — PX: IR REMOVAL TUN CV CATH W/O FL: IMG2289

## 2017-10-25 LAB — CBC
HCT: 31.7 % — ABNORMAL LOW (ref 39.0–52.0)
Hemoglobin: 9.8 g/dL — ABNORMAL LOW (ref 13.0–17.0)
MCH: 27.5 pg (ref 26.0–34.0)
MCHC: 30.9 g/dL (ref 30.0–36.0)
MCV: 88.8 fL (ref 78.0–100.0)
PLATELETS: 237 10*3/uL (ref 150–400)
RBC: 3.57 MIL/uL — ABNORMAL LOW (ref 4.22–5.81)
RDW: 18.5 % — AB (ref 11.5–15.5)
WBC: 5.2 10*3/uL (ref 4.0–10.5)

## 2017-10-25 LAB — RENAL FUNCTION PANEL
ALBUMIN: 3.1 g/dL — AB (ref 3.5–5.0)
Anion gap: 11 (ref 5–15)
BUN: 52 mg/dL — AB (ref 6–20)
CALCIUM: 8.9 mg/dL (ref 8.9–10.3)
CO2: 26 mmol/L (ref 22–32)
CREATININE: 3.13 mg/dL — AB (ref 0.61–1.24)
Chloride: 91 mmol/L — ABNORMAL LOW (ref 101–111)
GFR, EST AFRICAN AMERICAN: 23 mL/min — AB (ref >60)
GFR, EST NON AFRICAN AMERICAN: 20 mL/min — AB (ref >60)
Glucose, Bld: 93 mg/dL (ref 65–99)
Phosphorus: 4.6 mg/dL (ref 2.5–4.6)
Potassium: 5.4 mmol/L — ABNORMAL HIGH (ref 3.5–5.1)
SODIUM: 128 mmol/L — AB (ref 135–145)

## 2017-10-25 LAB — PROTIME-INR
INR: 2.73
PROTHROMBIN TIME: 28.7 s — AB (ref 11.4–15.2)

## 2017-10-25 LAB — GLUCOSE, CAPILLARY
GLUCOSE-CAPILLARY: 98 mg/dL (ref 65–99)
GLUCOSE-CAPILLARY: 200 mg/dL — AB (ref 65–99)
Glucose-Capillary: 146 mg/dL — ABNORMAL HIGH (ref 65–99)
Glucose-Capillary: 148 mg/dL — ABNORMAL HIGH (ref 65–99)

## 2017-10-25 MED ORDER — LIDOCAINE HCL 1 % IJ SOLN
INTRAMUSCULAR | Status: AC
  Filled 2017-10-25: qty 20

## 2017-10-25 MED ORDER — CHLORHEXIDINE GLUCONATE 4 % EX LIQD
CUTANEOUS | Status: AC
  Filled 2017-10-25: qty 15

## 2017-10-25 MED ORDER — FUROSEMIDE 10 MG/ML IJ SOLN
INTRAMUSCULAR | Status: AC
  Filled 2017-10-25: qty 4

## 2017-10-25 MED ORDER — LIDOCAINE HCL (PF) 1 % IJ SOLN
INTRAMUSCULAR | Status: DC | PRN
  Administered 2017-10-25: 4 mL

## 2017-10-25 MED ORDER — FUROSEMIDE 10 MG/ML IJ SOLN
80.0000 mg | Freq: Once | INTRAMUSCULAR | Status: AC
  Administered 2017-10-25: 80 mg via INTRAVENOUS
  Filled 2017-10-25: qty 8

## 2017-10-25 MED ORDER — WARFARIN SODIUM 2.5 MG PO TABS
2.5000 mg | ORAL_TABLET | Freq: Once | ORAL | Status: AC
  Administered 2017-10-25: 2.5 mg via ORAL
  Filled 2017-10-25: qty 1

## 2017-10-25 MED ORDER — PATIROMER SORBITEX CALCIUM 8.4 G PO PACK
8.4000 g | PACK | Freq: Once | ORAL | Status: AC
  Administered 2017-10-25: 8.4 g via ORAL
  Filled 2017-10-25: qty 4

## 2017-10-25 NOTE — Progress Notes (Signed)
Subjective:  Patient seen lying comfortably in bed this morning. Denies recurrence of chest pain, shortness of breath, or dysuria. Discussed possible plan for discharge after discussion with Dr. Bradd Burner and Dr. Florene Glen.  Objective:  Vital signs in last 24 hours: Vitals:   10/24/17 0825 10/24/17 1434 10/24/17 2030 10/25/17 0357  BP: 117/65 133/61 (!) 124/58 133/61  Pulse: (!) 51 (!) 54 (!) 56 (!) 52  Resp: 15 18 19 14   Temp: (!) 97.5 F (36.4 C) 99.6 F (37.6 C) 98.2 F (36.8 C) 98.1 F (36.7 C)  TempSrc: Oral Oral Oral Oral  SpO2: 99% 100% 100% 97%  Weight:    164 lb 10.9 oz (74.7 kg)  Height:       Physical Exam: GEN: Lying in bed in NAD. Alert and oriented. No acute distress.  RESP: Intermittent crackles in bilateral bases. No increased work of breathing, on RA. CV: Normal rate and regular rhythm. Systolic murmur, best heard at RUSB. No LE edema. ABD: Soft. Non-tender. Mild abdominal swelling. Normoactive bowel sounds. GU: Foley in place. EXT: No edema. Warm and well perfused. NEURO: Cranial nerves II-XII grossly intact. Able to lift all four extremities against gravity. No apparent audiovisual hallucinations. Speech fluent and appropriate. PSYCH: Patient is calm and pleasant. Appropriate affect. Well-groomed; speech is appropriate and on-subject.  Assessment/Plan:  Principal Problem:   Acute respiratory failure with hypoxia (HCC) Active Problems:   DM (diabetes mellitus), type 2, uncontrolled ([place])   Essential hypertension   Paroxysmal atrial fibrillation with rapid ventricular response (HCC)   COPD (chronic obstructive pulmonary disease) (HCC)   Chronic combined systolic and diastolic CHF (congestive heart failure) (HCC)   Ischemic cardiomyopathy   Right bundle branch block (RBBB) on electrocardiography   Tachy-brady syndrome (HCC)   Sinus pause   Influenza with respiratory manifestation   Aspiration pneumonia of both lower lobes (HCC)   AKI (acute kidney  injury) ([place])   Pressure injury of skin   Anemia of chronic disease   Hyponatremia   Fluid overload  [name] is a 62yo male with PMH of paroxysmal afib (on warfarin), HFrEF (TTE 09/2017: LVEF 40-45%), CAD (s/p CABG 2017), carotid stenosis (s/p L carotid endarterectomy), hx of CVA, COPD, HTN, and insulin-dependent diabetes who initially presented on 2/8 with generalized weakness, found to be in afib with RVR. His hospital course has been complicated by tachybrady syndrome, requiring temporary transvenous pacing wires and amiodarone drip with plans to implant permanent pacemaker. Unfortunately, he went into acute respiratory failure 2/2 influenza and aspiration pneumonia, requiring intubation 2/12-2/22 (completed course of tamiflu, linezolid, and cefepime). He was transferred back to our service on 2/22 and has been on oral amiodarone for afib without further recurrence of pauses or significant bradycardia. During his stay in the ICU, his course was further complicated by acute on chronic renal failure, requiring placement of temporary dialysis catheter and CRRT in ICU. He is now on intermittent HD and awaiting renal recovery vs need for long term dialysis. He has failed trial of void, requiring foley catheter.  Acute on Chronic Renal Failure On intermittent HD via tunneled dialysis cather placed on 2/25for volume overload, last session 3/12 (last session prior to that was 3/4). Cr continues to improve very slowly, 3.32 -> 3.13 this AM. Potassium elevated to 5.4 (increased lasix dose yesterday afternoon) and sodium improved to 128 with fluid restriction and increased lasix. Patient continues to diurese well, gave a dose of IV lasix 80mg  yesterday, with 2070cc UOP over last  24h. Discussed with PCP Dr. Bradd Burner and Dr. Florene Glen, who feel that he may have turned the corner and may no longer require HD. Possible d/c with close f/u with [facility]. [facility] to draw daily labs and discuss with outpatient nephrologist. -  Nephrology consulted; appreciate their recommendations - Intermittent dialysis, per Nephrology - RFP in AM - IV lasix 80mg  x1 this AM - 1500cc fluid restriction - I/Os - Nephrology will possibly remove TDC today? - Will discuss dispo plan with Nephrology and [facility]  Hyperkalemia K 5.2 -> 5.4 this AM after IV lasix x1 yesterday. Patient refuses kayexalate due to diarrhea. - IV lasix 80mg  x1 this AM - RFP in AM  Hypervolemic hyponatremia 2/2 acute on chronic renal failure. Improved from 126 -> 128 yesterday with fluid restriction and IV lasix dose. - RFP in AM - Diuresis, as above - 1500cc fluid restriction  Urinary retention Foley placed in ICU, as patient developed acute urinary retention while acutely ill. Patient failed trial of void on 10/18/2017 and catheter replaced on 10/19/2017. - Continue to clinically monitor for signs/symptoms of infection - Will need outpatient Urology f/u - Oxybutynin 2.5mg  BID PRN for bladder spasms  Chest pain, resolved - Continue to monitor  AcuteRespiratory Failure 2/2 recent aspiration pneumonia, influenza,and volume overload, improved Satting well on RA at rest. Denies dyspnea or chest pain. - Incentive spirometry and flutter valve - Out of bed as able  PAF with RVR, Tachy-Brady Syndrome Currently on PO amiodarone and warfarin, with HR controlled in 50s-60s. Currently in NSR. - Continue telemetry - Continue amiodarone 200mg  BID - Continue warfarin per pharmacy (last INR 2.73) - Avoid metoprolol to prevent any bradycardia - Outpatient f/u with Cardiology for pacemaker placement  DM Stable. CBG 90s-120s. - Continue Lantus 14 units QHS - Novolog SSI-M TID WC - CBG monitoring  Sacral pressure ulcer, stage 2-3 - Appreciate assistance from nursing staff with keeping area clean and dry  FEN/GI - Dysphagia 3 diet, no IVF - Pantoprazole 40 mg daily  VTE Prophylaxis:Warfarin Code Status: Full  Dispo: Anticipated discharge in ~1-2  day(s).  Joseph Ewing, MD 10/25/2017, 7:50 AM Pager: [phone]

## 2017-10-25 NOTE — Progress Notes (Signed)
Background:  62 yo male, admitted 2/9 w/AF RVR, temp pacer required 2/2 long pauses w/hypotension, Flu A +,s/p VDRA. AKI in this setting (ATN rel shock/sepsis vs vanco), CRRT 2/14 - 2/18. IHD since 09/27/17. Baseline creatinine around 1.6-1.7 past year.   Assessment/Recommendations 1. AKI on CKD3 - shock/+/-vanco/sepsis related ATN. CRRT 2/14 - 2/18; HDlast on3/12. I think we are in a slow recovery phase. 2. PAF tachy/brady syndrome - amio/coumadin 3. S/p flu/PNA - post ATB's/tamiflu 4. Systolic HF - EF 47-82%. Making urine- 5. Anemias/pFeraheme per pharmacy (2/23, 2/25)-on Aranesp 6. DM per primary team 7. Urinary retention - - foleypreviouslyreplacedas he failed voiding trial 8. Hyperkalemia-Hyponatremia--limit PO fluids if needed and give Veltessa prn  Recs: He needs aggressive fluid restriction for the hyponatremia, Veltessa today since he refuses kayexylate            IR to remove catheter before discharge.  Subjective: Interval History: Feels ok  Objective: Vital signs in last 24 hours: Temp:  [98.1 F (36.7 C)-99.6 F (37.6 C)] 98.1 F (36.7 C) (03/20 0357) Pulse Rate:  [52-56] 52 (03/20 0357) Resp:  [14-19] 14 (03/20 0357) BP: (124-133)/(58-61) 133/61 (03/20 0357) SpO2:  [97 %-100 %] 97 % (03/20 0357) Weight:  [74.7 kg (164 lb 10.9 oz)] 74.7 kg (164 lb 10.9 oz) (03/20 0357) Weight change: -0.416 kg (-14.7 oz)  Intake/Output from previous day: 03/19 0701 - 03/20 0700 In: 840 [P.O.:840] Out: 2070 [Urine:2070] Intake/Output this shift: No intake/output data recorded.  General appearance: alert and cooperative Head: Normocephalic, without obvious abnormality, atraumatic Neurologic: Grossly normal  Lab Results: Recent Labs    10/25/17 0514  WBC 5.2  HGB 9.8*  HCT 31.7*  PLT 237   BMET:  Recent Labs    10/24/17 0236 10/25/17 0514  NA 126* 128*  K 5.2* 5.4*  CL 89* 91*  CO2 24 26  GLUCOSE 115* 93  BUN 55* 52*  CREATININE 3.32* 3.13*  CALCIUM  8.7* 8.9   No results for input(s): PTH in the last 72 hours. Iron Studies: No results for input(s): IRON, TIBC, TRANSFERRIN, FERRITIN in the last 72 hours. Studies/Results: No results found.  Scheduled: . amiodarone  200 mg Oral BID  . atorvastatin  80 mg Oral Daily  . chlorhexidine  15 mL Mouth Rinse BID  . darbepoetin (ARANESP) injection - NON-DIALYSIS  150 mcg Subcutaneous Q Wed-1800  . feeding supplement (NEPRO CARB STEADY)  237 mL Oral BID BM  . hydrocerin   Topical BID  . insulin aspart  0-15 Units Subcutaneous TID WC  . insulin glargine  14 Units Subcutaneous QHS  . mouth rinse  15 mL Mouth Rinse q12n4p  . multivitamin  1 tablet Oral QHS  . pantoprazole  40 mg Oral Daily  . PARoxetine  40 mg Oral Daily  . patiromer  8.4 g Oral Once  . polyethylene glycol  17 g Oral Daily  . senna  1 tablet Oral BID  . senna-docusate  1 tablet Oral QHS  . warfarin  2.5 mg Oral ONCE-1800  . Warfarin - Pharmacist Dosing Inpatient   Does not apply q1800     LOS: 40 days   Estanislado Emms 10/25/2017,10:50 AM

## 2017-10-25 NOTE — Consult Note (Signed)
[facility] Nurse wound follow up Wound type:Stage 3 pressure injury to sacrococcygeal area.  This is all one connected area today. Patient is incontinent of stool while I am here.  Incontinence care provided and barrier cream applied.  Silicone foam applied  Scattered nonintact trauma lesions to bilateral anterior lower legs  Clean pink and covered with silicone border foam for protection and moist wound healing.   PRevalon boots in place.  Measurement: Sacrum:  4 cm x 3.4 cm  X 0.2 cm extends across coccyx and upper buttocks/sacral region Wound JSE:GBTD and moist Drainage (amount, consistency, odor) scant serosagnuinous Periwound:intact Dressing procedure/placement/frequency:Cleanse sacrococcygeal wound with soap and water.  Apply barrier cream for protection.  Cover with silicone border foam. Change every three days and PRN soilage.   [facility] team will monitor weekly while inpatient  Domenic Moras RN BSN [facility] Pager [phone]

## 2017-10-25 NOTE — Progress Notes (Signed)
Requested removal of TDC by IR.  Pt taking warfarin. Estanislado Emms, MD

## 2017-10-25 NOTE — Progress Notes (Signed)
ANTICOAGULATION CONSULT NOTE - Follow Up Consult  Pharmacy Consult for Coumadin Indication: atrial fibrillation  Patient Measurements: Height: 5' (152.4 cm) Weight: 164 lb 10.9 oz (74.7 kg) IBW/kg (Calculated) : 50  Vital Signs: Temp: 98.1 F (36.7 C) (03/20 0357) Temp Source: Oral (03/20 0357) BP: 133/61 (03/20 0357) Pulse Rate: 52 (03/20 0357)  Labs: Recent Labs    10/22/17 1955  10/23/17 0042 10/23/17 0644 10/23/17 1627 10/24/17 0236 10/25/17 0514  HGB  --   --   --   --   --   --  9.8*  HCT  --   --   --   --   --   --  31.7*  PLT  --   --   --   --   --   --  237  LABPROT  --   --   --  19.8*  --  22.8* 28.7*  INR  --   --   --  1.70  --  2.04 2.73  CREATININE  --    < > 3.41*  --  3.36* 3.32* 3.13*  TROPONINI <0.03  --  <0.03 <0.03  --   --   --    < > = values in this interval not displayed.   Estimated Creatinine Clearance: 20.5 mL/min (A) (by C-G formula based on SCr of 3.13 mg/dL (H)).  Assessment: 72 yom with Afib and Pharmacy consulted for warfarin dosing.  H/H and platelets are stable and no noted acute bleeding complications documented. INR trending up with larger jump today, 2.04>>2.73 - will give lower dose tonight.  Drug/Drug Interaction Potential:  Amiodarone + Warfarin = potential inc. INR, bleeding risk  Goal of Therapy:  INR 2-3 Monitor platelets by anticoagulation protocol: Yes   Plan:  Coumadin 2.5 mg PO x 1 dose tonight Monitor daily INR, CBC, s/sx bleeding, DDI  Elicia Lamp, PharmD, BCPS Clinical Pharmacist Clinical phone for 10/25/2017 until 3:30pm: [phone] If after 3:30pm, please call main pharmacy at: [phone] 10/25/2017 10:09 AM

## 2017-10-25 NOTE — Procedures (Signed)
PROCEDURE SUMMARY:  Successful removal of tunneled hemodialysis catheter.  Patient tolerated well.  See full dictation in Imaging for details.  WENDY S BLAIR PA-C 10/25/2017 3:06 PM

## 2017-10-26 DIAGNOSIS — Z8701 Personal history of pneumonia (recurrent): Secondary | ICD-10-CM

## 2017-10-26 DIAGNOSIS — L89153 Pressure ulcer of sacral region, stage 3: Secondary | ICD-10-CM

## 2017-10-26 DIAGNOSIS — E871 Hypo-osmolality and hyponatremia: Secondary | ICD-10-CM

## 2017-10-26 DIAGNOSIS — R339 Retention of urine, unspecified: Secondary | ICD-10-CM

## 2017-10-26 DIAGNOSIS — L89152 Pressure ulcer of sacral region, stage 2: Secondary | ICD-10-CM

## 2017-10-26 DIAGNOSIS — R011 Cardiac murmur, unspecified: Secondary | ICD-10-CM

## 2017-10-26 DIAGNOSIS — I130 Hypertensive heart and chronic kidney disease with heart failure and stage 1 through stage 4 chronic kidney disease, or unspecified chronic kidney disease: Secondary | ICD-10-CM

## 2017-10-26 DIAGNOSIS — R197 Diarrhea, unspecified: Secondary | ICD-10-CM

## 2017-10-26 DIAGNOSIS — E875 Hyperkalemia: Secondary | ICD-10-CM

## 2017-10-26 DIAGNOSIS — Z794 Long term (current) use of insulin: Secondary | ICD-10-CM

## 2017-10-26 DIAGNOSIS — E8779 Other fluid overload: Secondary | ICD-10-CM

## 2017-10-26 DIAGNOSIS — Z7901 Long term (current) use of anticoagulants: Secondary | ICD-10-CM

## 2017-10-26 LAB — RENAL FUNCTION PANEL
Albumin: 2.8 g/dL — ABNORMAL LOW (ref 3.5–5.0)
Anion gap: 10 (ref 5–15)
BUN: 52 mg/dL — AB (ref 6–20)
CALCIUM: 8.6 mg/dL — AB (ref 8.9–10.3)
CO2: 27 mmol/L (ref 22–32)
Chloride: 94 mmol/L — ABNORMAL LOW (ref 101–111)
Creatinine, Ser: 3.08 mg/dL — ABNORMAL HIGH (ref 0.61–1.24)
GFR calc Af Amer: 23 mL/min — ABNORMAL LOW (ref >60)
GFR, EST NON AFRICAN AMERICAN: 20 mL/min — AB (ref >60)
GLUCOSE: 112 mg/dL — AB (ref 65–99)
PHOSPHORUS: 4.3 mg/dL (ref 2.5–4.6)
Potassium: 4.9 mmol/L (ref 3.5–5.1)
SODIUM: 131 mmol/L — AB (ref 135–145)

## 2017-10-26 LAB — GLUCOSE, CAPILLARY
GLUCOSE-CAPILLARY: 93 mg/dL (ref 65–99)
GLUCOSE-CAPILLARY: 133 mg/dL — AB (ref 65–99)

## 2017-10-26 LAB — PROTIME-INR
INR: 3.62
PROTHROMBIN TIME: 35.8 s — AB (ref 11.4–15.2)

## 2017-10-26 MED ORDER — FUROSEMIDE 80 MG PO TABS: 80.0000 mg | ORAL_TABLET | Freq: Two times a day (BID) | ORAL | 0 refills | Status: DC | PRN

## 2017-10-26 MED ORDER — INSULIN GLARGINE 100 UNIT/ML ~~LOC~~ SOLN: 14.0000 [IU] | Freq: Every day | SUBCUTANEOUS | 11 refills | Status: DC

## 2017-10-26 MED ORDER — ATORVASTATIN CALCIUM 80 MG PO TABS: 80.0000 mg | ORAL_TABLET | Freq: Every day | ORAL | 0 refills | Status: DC

## 2017-10-26 MED ORDER — NEPRO/CARBSTEADY PO LIQD: 237.0000 mL | Freq: Two times a day (BID) | ORAL | 0 refills | Status: DC

## 2017-10-26 MED ORDER — DARBEPOETIN ALFA 150 MCG/0.3ML IJ SOSY: 150.0000 ug | PREFILLED_SYRINGE | INTRAMUSCULAR | 0 refills | Status: DC

## 2017-10-26 MED ORDER — FUROSEMIDE 10 MG/ML IJ SOLN
80.0000 mg | Freq: Once | INTRAMUSCULAR | Status: AC
  Administered 2017-10-26: 80 mg via INTRAVENOUS
  Filled 2017-10-26: qty 8

## 2017-10-26 MED ORDER — RESOURCE THICKENUP CLEAR PO POWD: ORAL | Status: DC

## 2017-10-26 MED ORDER — SENNOSIDES-DOCUSATE SODIUM 8.6-50 MG PO TABS: 1.0000 | ORAL_TABLET | Freq: Every evening | ORAL | 0 refills | Status: AC | PRN

## 2017-10-26 MED ORDER — AMIODARONE HCL 200 MG PO TABS: 200.0000 mg | ORAL_TABLET | Freq: Two times a day (BID) | ORAL | 0 refills | Status: DC

## 2017-10-26 MED ORDER — INSULIN ASPART 100 UNIT/ML ~~LOC~~ SOLN: 0.0000 [IU] | Freq: Three times a day (TID) | SUBCUTANEOUS | 11 refills | Status: DC

## 2017-10-26 MED ORDER — RENA-VITE PO TABS: 1.0000 | ORAL_TABLET | Freq: Every day | ORAL | 0 refills | Status: DC

## 2017-10-26 NOTE — Progress Notes (Signed)
Subjective:  [name] is feeling well today. Denies trouble breathing and urinary symptoms. Discussed his renal function has improved and that he is unlikely to need HD in the future. Also discussed possible discharge today.  Objective:  Vital signs in last 24 hours: Vitals:   10/26/17 0021 10/26/17 0326 10/26/17 0429 10/26/17 1105  BP: (!) 102/58 (!) 145/69  (!) 124/48  Pulse:    (!) 56  Resp: 17  20   Temp: 98.2 F (36.8 C) 97.9 F (36.6 C)    TempSrc: Oral Oral    SpO2: 100% 99%  98%  Weight:   164 lb 14.5 oz (74.8 kg)   Height:       Physical Exam: GEN: Lying in bed in NAD. Alert and oriented. No acute distress.  RESP: Intermittent crackles in bilateral bases. No increased work of breathing, on RA. CV: Normal rate and regular rhythm. Systolic murmur, best heard at RUSB. No LE edema. ABD: Soft. Non-tender. Mild abdominal swelling. Normoactive bowel sounds. GU: Foley in place. EXT: No edema. Warm and well perfused. NEURO: Cranial nerves II-XII grossly intact. Able to lift all four extremities against gravity. No apparent audiovisual hallucinations. Speech fluent and appropriate. PSYCH: Patient is calm and pleasant. Appropriate affect. Well-groomed; speech is appropriate and on-subject.  Assessment/Plan:  Principal Problem:   Acute respiratory failure with hypoxia (HCC) Active Problems:   DM (diabetes mellitus), type 2, uncontrolled ([place])   Essential hypertension   Paroxysmal atrial fibrillation with rapid ventricular response (HCC)   COPD (chronic obstructive pulmonary disease) (HCC)   Chronic combined systolic and diastolic CHF (congestive heart failure) (HCC)   Ischemic cardiomyopathy   Right bundle branch block (RBBB) on electrocardiography   Tachy-brady syndrome (HCC)   Sinus pause   Influenza with respiratory manifestation   Aspiration pneumonia of both lower lobes (HCC)   AKI (acute kidney injury) ([place])   Pressure injury of skin   Anemia of chronic  disease   Hyponatremia   Fluid overload  [name] is a 62yo male with PMH of paroxysmal afib (on warfarin), HFrEF (TTE 09/2017: LVEF 40-45%), CAD (s/p CABG 2017), carotid stenosis (s/p L carotid endarterectomy), hx of CVA, COPD, HTN, and insulin-dependent diabetes who initially presented on 2/8 with generalized weakness, found to be in afib with RVR. His hospital course has been complicated by tachybrady syndrome, requiring temporary transvenous pacing wires and amiodarone drip with plans to implant permanent pacemaker. Unfortunately, he went into acute respiratory failure 2/2 influenza and aspiration pneumonia, requiring intubation 2/12-2/22 (completed course of tamiflu, linezolid, and cefepime). He was transferred back to our service on 2/22 and has been on oral amiodarone for afib without further recurrence of pauses or significant bradycardia. During his stay in the ICU, his course was further complicated by acute on chronic renal failure, requiring placement of temporary dialysis catheter and CRRT in ICU. He is now on intermittent HD and awaiting renal recovery vs need for long term dialysis. He has failed trial of void, requiring foley catheter. TDC discontinued yesterday, as Nephrology feels he has most likely turned the corner and likely will not require HD again. Possible d/c to SNF with close [facility] f/u to continue following for renal recovery.  Acute on Chronic Renal Failure On intermittent HD via tunneled dialysis cather placed on 2/25for volume overload, last session 3/12 (last session prior to that was 3/4). Cr continues to improve very slowly, 3.13 -> 3.08 this AM. Potassium and sodium improved. Patient continues to diurese well,  on IV lasix 80mg  BID, with 1750cc UOP over last 24h. TDC pulled yesterday by IR. Discussed with Nephrology who recommends PO lasix 80mg  BID, as clinically indicated. Will touch base with PCP Dr. Bradd Burner for possible d/c with close f/u with [facility]. [facility] to draw daily  labs and discuss with outpatient nephrologist. - Nephrology consulted; appreciate their recommendations - RFP in AM - 1500cc fluid restriction - I/Os - IV lasix 80mg  x1 - Will d/c with PO lasix 80mg  BID PRN based on clinical picture - Will discuss dispo plan with Nephrology and [facility]  Hyperkalemia K 5.4 -> 4.9 this AM after IV lasix x1 and veltessa yesterday. Patient refuses kayexalate due to diarrhea. Had many bowel movements last night with Veltessa. - RFP in AM  Hypervolemic hyponatremia 2/2 acute on chronic renal failure. Improved from 128 -> 131 yesterday with fluid restriction and IV lasix dose. - RFP in AM - Diuresis, as above - 1500cc fluid restriction  Urinary retention Foley placed in ICU, as patient developed acute urinary retention while acutely ill. Patient failed trial of void on 10/18/2017 and catheter replaced on 10/19/2017. - Continue to clinically monitor for signs/symptoms of infection - Will need outpatient Urology f/u - Oxybutynin 2.5mg  BID PRN for bladder spasms  Chest pain, resolved - Continue to monitor  AcuteRespiratory Failure 2/2 recent aspiration pneumonia, influenza,and volume overload, improved Satting well on RA at rest. Denies dyspnea or chest pain. - Incentive spirometry and flutter valve - Out of bed as able  PAF with RVR, Tachy-Brady Syndrome Currently on PO amiodarone and warfarin, with HR controlled in 50s-60s. Currently in NSR. - Continue telemetry - Continue amiodarone 200mg  BID - Continue warfarin per pharmacy (last INR 2.73) - Avoid metoprolol to prevent any bradycardia - Outpatient f/u with Cardiology for pacemaker placement  DM Stable. CBG 90s-200s. - Continue Lantus 14 units QHS - Novolog SSI-M TID WC - CBG monitoring  Sacral pressure ulcer, stage 2-3 - Appreciate assistance from nursing staff with keeping area clean and dry  FEN/GI - Dysphagia 3 diet, no IVF - Pantoprazole 40 mg daily  VTE Prophylaxis:Warfarin Code  Status: Full  Dispo: Anticipated discharge in ~1-2 day(s).  Colbert Ewing, MD 10/26/2017, 12:09 PM Pager: [phone]

## 2017-10-26 NOTE — Progress Notes (Addendum)
Patient left via wheelchair at this time going to [facility]. [facility] arranged transportation and picked him up.   Janae Bridgeman, RN

## 2017-10-26 NOTE — Progress Notes (Addendum)
Spoke with Kennieth Rad, LPN at [facility] and gave report. Patient will be transported to facility by [facility]. IV removed and telemetry discontinued at this time.   Janae Bridgeman, RN

## 2017-10-26 NOTE — Progress Notes (Signed)
ANTICOAGULATION CONSULT NOTE - Follow Up Consult  Pharmacy Consult for Coumadin Indication: atrial fibrillation  Patient Measurements: Height: 5' (152.4 cm) Weight: 164 lb 14.5 oz (74.8 kg) IBW/kg (Calculated) : 50  Vital Signs: Temp: 97.9 F (36.6 C) (03/21 0326) Temp Source: Oral (03/21 0326) BP: 145/69 (03/21 0326)  Labs: Recent Labs    10/24/17 0236 10/25/17 0514 10/26/17 0229  HGB  --  9.8*  --   HCT  --  31.7*  --   PLT  --  237  --   LABPROT 22.8* 28.7* 35.8*  INR 2.04 2.73 3.62  CREATININE 3.32* 3.13* 3.08*   Estimated Creatinine Clearance: 20.8 mL/min (A) (by C-G formula based on SCr of 3.08 mg/dL (H)).  Assessment: 1 yom with Afib and Pharmacy consulted for warfarin dosing.  H/H and platelets are stable and no noted acute bleeding complications documented. INR trending up, now supratherapeutic at 3.62 - will hold tonight and likely need to resume at lower doses.  Drug/Drug Interaction Potential:  Amiodarone + Warfarin = potential inc. INR, bleeding risk  Goal of Therapy:  INR 2-3 Monitor platelets by anticoagulation protocol: Yes   Plan:  Hold warfarin tonight Monitor daily INR, CBC, s/sx bleeding, DDI  Elicia Lamp, PharmD, BCPS Clinical Pharmacist Clinical phone for 10/26/2017 until 3:30pm: [phone] If after 3:30pm, please call main pharmacy at: [phone] 10/26/2017 10:05 AM

## 2017-10-26 NOTE — Clinical Social Work Placement (Signed)
   CLINICAL SOCIAL WORK PLACEMENT  NOTE  Date:  10/26/2017  Patient Details  Name: [name] MRN: [id number] Date of Birth: [birth year]  Clinical Social Work is seeking post-discharge placement for this patient at the [place] level of care (*CSW will initial, date and re-position this form in  chart as items are completed):  Yes   Patient/family provided with [place] Work Department's list of facilities offering this level of care within the geographic area requested by the patient (or if unable, by the patient's family).  Yes   Patient/family informed of their freedom to choose among providers that offer the needed level of care, that participate in Medicare, Medicaid or managed care program needed by the patient, have an available bed and are willing to accept the patient.  Yes   Patient/family informed of [facility]'s ownership interest in [facility] and [facility], as well as of the fact that they are under no obligation to receive care at these facilities.  PASRR submitted to EDS on       PASRR number received on       Existing PASRR number confirmed on 10/26/17     FL2 transmitted to all facilities in geographic area requested by pt/family on 10/26/17     FL2 transmitted to all facilities within larger geographic area on       Patient informed that his/her managed care company has contracts with or will negotiate with certain facilities, including the following:            Patient/family informed of bed offers received.  Patient chooses bed at [facility] recommends and patient chooses bed at      Patient to be transferred to [facility] on 10/26/17.  [facility]     Patient family notified on 10/26/17 of transfer.  Name of family member notified:  [name]     PHYSICIAN       Additional Comment:     _______________________________________________ Wende Neighbors, LCSW 10/26/2017, 2:19 PM

## 2017-10-26 NOTE — Progress Notes (Signed)
Background:  62 yo male, admitted 2/9 w/AF RVR, temp pacer required 2/2 long pauses w/hypotension, Flu A +,s/p VDRA. AKI in this setting (ATN rel shock/sepsis vs vanco), CRRT 2/14 - 2/18. IHD since 09/27/17. Baseline creatinine around 1.6-1.7 past year.   Assessment/Recommendations 1. AKI on CKD3 - shock/+/-vanco/sepsis related ATN. CRRT 2/14 - 2/18; HDlast on3/12. I think we are in a slow recovery phase.  HD cath has been removed by IR. 2. Urinary retention - - foleypreviouslyreplacedas he failed voiding trial 3. Hyperkalemia, improve 4. Hyponatremia--limit PO fluids if needed   Management of recovery of renal function will be at [facility].  Renal can see at their request but nothing scheduled at this time.  Estanislado Emms, MD

## 2017-10-26 NOTE — Progress Notes (Addendum)
Clinical Social Worker facilitated patient discharge including contacting patient family and facility to confirm patient discharge plans.  Clinical information faxed to facility and family agreeable with plan.  [facility].  RN to call [phone] (pt will go in room 206) for report prior to discharge.  Clinical Social Worker will sign off for now as social work intervention is no longer needed. Please consult [place] again if new need arises.  Rhea Pink, MSW, [employer]

## 2017-10-26 NOTE — Care Management Note (Signed)
Case Management Note Previous CM note completed by Zenon Mayo, RN 09/20/2017, 8:03 PM    Patient Details  Name: [name] MRN: [id number] Date of Birth: February 07, 1956  Subjective/Objective:   From home with sister, [name] [phone] pta was able to do grocery shopping and some cooking.  He has a walker , cane and hospital bed at home.  [facility], [facility] Wed.   Presents with tachy brady syndrom with 6 -10 second pauses, afib with rvr, influenza A, acute renal failure, bil infiltrates, sepsis, temp pacer placed 2/8, intubated on 2/12 due to declining resp status, nephrology consulted , plan to place HD cath tomorrow and if kidney function does not improve plan for CVVHD.     2/13 Tomi Bamberger RN, BSN - [place] sndrome with 6-10 second pauses , afib with rvr, influenza A, acuter renal failure, bil infiltrates, sepsis. Temp pacer placed 2/8, intubated 2/12 on full vent support.  Nephrology consulted, plan for HD cath placement today and start CVVHD if no improvement in renal functin.  conts on iv abc, pressors, hep drip.   2/15 [place], BSN - abd distened,will get KUB today, started on CRRT.   2/19 Tomi Bamberger RN, BSN - [place] Syndrome with intermittent pauses, afib with RVR, acute resp failure secondary to flu A,on full vent support since 212, bil infiltrates, acute renal failure  Temp pacer, placed 2/8.  Nephrology following.  CRRT stopped paln for IHD, failed pressure support wean yesterday, Temp pacer pulled yesterday, plan for permanent pacer when ever acute issues over.  conts on amio, precedex, fentanyl, hep drip, iv abx.     2/20 Tomi Bamberger RN, BSN- CRRT stopped , now on IHD, will attempt SBT in am, if no significant wean in next few days MD will discuss trach. conts on amio, hep drip and solucortef.             Action/Plan: NCM  Will follow for transition of care needs.  Expected Discharge Date:  10/26/17                Expected Discharge Plan:  Skilled Nursing Facility  In-House Referral:  Clinical Social Work  Discharge planning Services  CM Consult  Post Acute Care Choice:  NA Choice offered to:  NA  DME Arranged:  N/A DME Agency:  NA  HH Arranged:  NA HH Agency:  NA  Status of Service:  In process  If discussed at Long Length of Stay Meetings, dates discussed:  3/12, 3/14, 3/19, 3/21  Discharge Disposition: skilled facility   Additional Comments:  10/26/17- 1415- Marvetta Gibbons RN, CM- per renal pt in recovery phase- tunneled HD cath has been removed and pt stable for d/c to SNF- CSW following for placement needs- pt to d/c to [place].   10/17/17- 1500- Corah Willeford RN, CM- pt continues to need IHD- renal following but unable to declare ESRD at this time- CSW continues to follow for SNF placement needs.   Marvetta Gibbons [place], RN 10/26/2017, 2:24 PM [phone] 4E Transition Care Coordinator

## 2017-10-26 NOTE — Progress Notes (Addendum)
Pt discharged to [facility]. Pt left with all belongings. Pt discharged with foley and is to follow-up with urology.     Grant Fontana BSN, RN

## 2017-11-03 ENCOUNTER — Ambulatory Visit (INDEPENDENT_AMBULATORY_CARE_PROVIDER_SITE_OTHER): Payer: Medicare (Managed Care) | Admitting: Internal Medicine

## 2017-11-03 VITALS — BP 118/54 | HR 74 | Resp 95 | Ht 61.0 in | Wt 173.0 lb

## 2017-11-03 DIAGNOSIS — I480 Paroxysmal atrial fibrillation: Secondary | ICD-10-CM | POA: Diagnosis not present

## 2017-11-03 DIAGNOSIS — I495 Sick sinus syndrome: Secondary | ICD-10-CM | POA: Diagnosis not present

## 2017-11-03 MED ORDER — AMIODARONE HCL 200 MG PO TABS: 200.0000 mg | ORAL_TABLET | Freq: Every day | ORAL | 3 refills | Status: DC

## 2017-11-03 NOTE — Progress Notes (Signed)
HPI 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 He was admitted to the hospital back in February with syncope and was diagnosed with influenza complicated by respiratory failure. He would go in and out of atrial fib with long pauses and had a temporary PM inserted. He eventually improved and went back to NSR. He did not require a PPM as in NSR he did not have more pauses. He has slowly improved although he is still weak. No additional syncope and he denies palpitations. He has been on amio 400 mg daily. Allergies  Allergen Reactions  . Penicillins Hives, Nausea And Vomiting, Swelling and Other (See Comments)    Tolerated Cefepime Has patient had a PCN reaction causing immediate rash, facial/tongue/throat swelling, SOB or lightheadedness with hypotension: YES Has patient had a PCN reaction causing severe rash involving mucus membranes or skin necrosis: No Has patient had a PCN reaction that required hospitalization No Has patient had a PCN reaction occurring within the last 10 years: No If all of the above answers are "NO", then may proceed with Cephalosporin use.      Current Outpatient Medications  Medication Sig Dispense Refill  . acetaminophen (TYLENOL) 325 MG tablet Take 650 mg by mouth 3 (three) times daily as needed for mild pain.    Marland Kitchen amiodarone (PACERONE) 200 MG tablet Take 1 tablet (200 mg total) by mouth 2 (two) times daily. 60 tablet 0  . atorvastatin (LIPITOR) 80 MG tablet Take 1 tablet (80 mg total) by mouth daily. 30 tablet 0  . calcium carbonate (TUMS - DOSED IN MG ELEMENTAL CALCIUM) 500 MG chewable tablet Chew 1 tablet by mouth 3 (three) times daily as needed for indigestion or heartburn.    . Cholecalciferol (VITAMIN D3) 1000 UNITS CAPS Take 1,000 Units by mouth daily.    . Darbepoetin Alfa (ARANESP) 150 MCG/0.3ML SOSY injection Inject 0.3 mLs (150 mcg total) into the skin every Wednesday at 6 PM. 1.68 mL 0  . furosemide (LASIX) 80 MG  tablet Take 1 tablet (80 mg total) by mouth 2 (two) times daily as needed for fluid. 60 tablet 0  . insulin aspart (NOVOLOG) 100 UNIT/ML injection Inject 0-15 Units into the skin 3 (three) times daily with meals. 10 mL 11  . insulin glargine (LANTUS) 100 UNIT/ML injection Inject 0.14 mLs (14 Units total) into the skin at bedtime. 10 mL 11  . ipratropium-albuterol (DUONEB) 0.5-2.5 (3) MG/3ML SOLN Take 3 mLs by nebulization 2 (two) times daily.    . Maltodextrin-Xanthan Gum (RESOURCE THICKENUP CLEAR) POWD Oral as needed    . multivitamin (RENA-VIT) TABS tablet Take 1 tablet by mouth at bedtime. 30 tablet 0  . nitroGLYCERIN (NITROSTAT) 0.4 MG SL tablet Place 0.4 mg under the tongue every 5 (five) minutes as needed for chest pain.    . Nutritional Supplements (FEEDING SUPPLEMENT, NEPRO CARB STEADY,) LIQD Take 237 mLs by mouth 2 (two) times daily between meals. 1000 mL 0  . pantoprazole (PROTONIX) 40 MG tablet Take 40 mg by mouth daily.    Marland Kitchen PARoxetine (PAXIL) 40 MG tablet Take 40 mg by mouth daily.     . polyvinyl alcohol (LIQUIFILM TEARS) 1.4 % ophthalmic solution Place 1 drop into both eyes 2 (two) times daily as needed for dry eyes. Natural Tears    . senna-docusate (SENOKOT-S) 8.6-50 MG tablet Take 1 tablet by mouth at bedtime as needed for mild constipation. 30 tablet 0  . Skin Protectants, Misc. (  EUCERIN) cream Apply 1 application topically 2 (two) times daily. TO AFFECTED AREAS     No current facility-administered medications for this visit.      Past Medical History:  Diagnosis Date  . Abdominal hernia    Chronic, not a good surgical candidate  . Abscess, abdomen 12/31/2010   Referred to Wound Care in 01/2011 because of multiple abd abscess with VERY large ventral hernia (please look at image of CT abd/pelvis 09/2010).  Because of hernia I was hesitant to I&D.      Marland Kitchen Anemia    History of Iron Def Anemia  . Anxiety   . AVM (arteriovenous malformation)    chronic GI blood loss  . Carotid  artery disease ([place])    s/p CEA  . Chronic diastolic CHF (congestive heart failure) (HCC)    takes Lasix  . Chronic low back pain   . COPD (chronic obstructive pulmonary disease) ([place])   . CVA (cerebral infarction) 09/2010   Bilateral with Left > Right  . Diabetes mellitus   . GERD (gastroesophageal reflux disease)   . History of nuclear stress test    Myoview 9/16:  Inferior, apical and inf-lateral ischemia; not gated; High Risk  . Hx of echocardiogram    Echo 5/16:  Mild LVH, EF 55%, indeterm. diast function, WMA could not be ruled out, MAC, trivial MR, mild LAE, normal RVF //  b. Echo 4/17: EF 55-60%, no RWMA, Gr 2 DD, Ao sclerosis, MAC, mild MS, mild LAE, PASP 55 mHg  . Hyperlipidemia   . Hypertension   . Intellectual disability    Sister helps to take care of him and takes him to appts  . Itching    all over body; pt scratches and has sores on bilateral arms and abdomen  . Lung nodule   . Myocardial infarction [facility]) 2016 ?   Heart attack  (  Per  pt. )  . Obesity   . Oxygen dependent    wears 2 liters at bedtime and when needed  . PAF (paroxysmal atrial fibrillation) ([place]) 06/2009   CHADS score 2 (HTN, DM) // Pradaxa for Afib // Pradaxa stopped due to worsening anemia (Hgb in 5/18 8.5 >> Pradaxa remains on hold)   . Pneumonia    hx of  . Stroke ([place])   . Tobacco user    Smokes 1ppd for multiple years.  Quit after hosp 09/2010.  . Tubular adenoma of colon   . Wears glasses     ROS:   All systems reviewed and negative except as noted in the HPI.   Past Surgical History:  Procedure Laterality Date  . AMPUTATION Right 03/29/2013   Procedure: AMPUTATION RAY;  Surgeon: Newt Minion, MD;  Location: [place];  Service: Orthopedics;  Laterality: Right;  Right Foot 3rd and Possible 4th Ray Amputation  . AMPUTATION Right 04/23/2013   Procedure: AMPUTATION RIGHT MID-FOOT;  Surgeon: Newt Minion, MD;  Location: [place];  Service: Orthopedics;  Laterality: Right;  . arm surgery Left      as a child  . CARDIAC CATHETERIZATION N/A 04/30/2015   Procedure: Left Heart Cath and Coronary Angiography;  Surgeon: Belva Crome, MD;  Location: [place] CV [facility];  Service: Cardiovascular;  Laterality: N/A;  . Carotid arteriogram  10/2010   30% right ICA stenosis, 40% left ICA stenosis   . CAROTID ENDARTERECTOMY Left 08-13-15   CEA  . CATARACT EXTRACTION, BILATERAL    . COLONOSCOPY WITH PROPOFOL N/A  06/10/2014   Procedure: COLONOSCOPY WITH PROPOFOL;  Surgeon: Jerene Bears, MD;  Location: [facility];  Service: Gastroenterology;  Laterality: N/A;  . CORONARY ARTERY BYPASS GRAFT N/A 08/13/2015   Procedure: CORONARY ARTERY BYPASS GRAFTING (CABG), ON PUMP, TIMES THREE, USING LEFT INTERNAL MAMMARY ARTERY, RIGHT GREATER SAPHENOUS VEIN HARVESTED ENDOSCOPICALLY;  Surgeon: [name], MD;  Location: [place];  Service: Open Heart Surgery;  Laterality: N/A;  . DEBRIDMENT OF DECUBITUS ULCER Right 02/13/2013  . ENDARTERECTOMY Left 08/13/2015   Procedure: ENDARTERECTOMY CAROTID;  Surgeon: Angelia Mould, MD;  Location: [place];  Service: Vascular;  Laterality: Left;  . ESOPHAGOGASTRODUODENOSCOPY (EGD) WITH PROPOFOL N/A 06/10/2014   Procedure: ESOPHAGOGASTRODUODENOSCOPY (EGD) WITH PROPOFOL;  Surgeon: Jerene Bears, MD;  [facility];  Service: Gastroenterology;  Laterality: N/A;  . GIVENS CAPSULE STUDY N/A 07/09/2014   Procedure: GIVENS CAPSULE STUDY;  Surgeon: Jerene Bears, MD;  [facility];  Service: Gastroenterology;  Laterality: N/A;  . I&D EXTREMITY Right 02/13/2013   Procedure: IRRIGATION AND DEBRIDEMENT FOOT ULCER;  Surgeon: Johnny Bridge, MD;  Location: [place];  Service: Orthopedics;  Laterality: Right;  PULSE LAVAGE  . INSERTION OF DIALYSIS CATHETER N/A 10/02/2017   Procedure: INSERTION OF TUNNELED DIALYSIS CATHETER;  Surgeon: Elam Dutch, MD;  Location: [facility] OR;  Service: Vascular;  Laterality: N/A;  . IR REMOVAL TUN CV CATH W/O FL  10/25/2017  . MULTIPLE TOOTH  EXTRACTIONS    . TEE WITHOUT CARDIOVERSION N/A 08/13/2015   Procedure: TRANSESOPHAGEAL ECHOCARDIOGRAM (TEE);  Surgeon: [name], MD;  Location: [place];  Service: Open Heart Surgery;  Laterality: N/A;  . TEMPORARY PACEMAKER N/A 09/15/2017   Procedure: TEMPORARY PACEMAKER;  Surgeon: Jettie Booze, MD;  Location: [place] CV LAB;  Service: Cardiovascular;  Laterality: N/A;  . TRANSESOPHAGEAL ECHOCARDIOGRAM  09/2010   No ASD or PFO. EF 60-65%.  Normal systolic function. No evidence of thrombus.   . TRANSTHORACIC ECHOCARDIOGRAM  09/2010    The cavity size was normal. Systolic function was vigorous.  EF 65-70%.  Normal wall funciton.   Marland Kitchen ULTRASOUND GUIDANCE FOR VASCULAR ACCESS  09/15/2017   Procedure: Ultrasound Guidance For Vascular Access;  Surgeon: Jettie Booze, MD;  Location: [place] CV LAB;  Service: Cardiovascular;;     Family History  Problem Relation Age of Onset  . Heart disease Mother   . Hypertension Sister   . Heart disease Brother   . Heart disease Father   . Peripheral vascular disease Father   . Heart disease Maternal Grandmother   . Heart attack Maternal Grandmother   . Breast cancer Maternal Grandmother   . Stomach cancer Maternal Uncle   . Colon cancer Neg Hx   . Stroke Neg Hx      Social History   Socioeconomic History  . Marital status: Single    Spouse name: Not on file  . Number of children: 0  . Years of education: Not on file  . Highest education level: Not on file  Occupational History    Employer: DISABLED  Social Needs  . Financial resource strain: Not on file  . Food insecurity:    Worry: Not on file    Inability: Not on file  . Transportation needs:    Medical: Not on file    Non-medical: Not on file  Tobacco Use  . Smoking status: Former Smoker    Packs/day: 1.00    Years: 42.00    Pack years: 42.00  Types: Cigarettes, Pipe    Last attempt to quit: 11/10/2010    Years since quitting: 6.9  . Smokeless tobacco: Former  Systems developer    Quit date: 10/02/2010  Substance and Sexual Activity  . Alcohol use: No    Alcohol/week: 0.0 oz    Comment: " last drink was 2003" 08/11/15  . Drug use: No  . Sexual activity: Not on file  Lifestyle  . Physical activity:    Days per week: Not on file    Minutes per session: Not on file  . Stress: Not on file  Relationships  . Social connections:    Talks on phone: Not on file    Gets together: Not on file    Attends religious service: Not on file    Active member of club or organization: Not on file    Attends meetings of clubs or organizations: Not on file    Relationship status: Not on file  . Intimate partner violence:    Fear of current or ex partner: Not on file    Emotionally abused: Not on file    Physically abused: Not on file    Forced sexual activity: Not on file  Other Topics Concern  . Not on file  Social History Narrative   Released from prison in 2003 after serving 2 years for indecency with a minor.    He smokes cigarettes greater than 30 years, also smoked a pipe.  Quit tobacco 09/2010.   Denies EtOH and drugs.      BP (!) 118/54   Pulse 74   Resp (!) 95   Ht _0  (1.549 m)   Wt 173 lb (78.5 kg)   BMI 32.69 kg/m   Physical Exam:  Chronically ill appearing 62 yo man, looks older than his stated age, NAD HEENT: Unremarkable Neck:  7 cm JVD, no thyromegally Lymphatics:  No adenopathy Back:  No CVA tenderness Lungs:  Clear with no wheezes HEART:  Regular rate rhythm, no murmurs, no rubs, no clicks, split s2. Abd:  soft, positive bowel sounds, no organomegally, no rebound, no guarding Ext:  2 plus pulses, no edema, no cyanosis, no clubbing Skin:  No rashes no nodules Neuro:  CN II through XII intact, motor grossly intact  EKG - nsr with RBBB  Assess/Plan: 1. PAF - he is now maintaining NSR. I have asked him to reduce his amio dose to 200 mg daily. 2. Tachy-brady syndrome - as he is maintaining NSR, he currently does not have an indiction  for PPM insertion.  3. HTN - His blood pressure is well controlled.  4. Dyslipidemia - he will continue his statin therapy and we will need to watch his LFT's on both amio and high dose lipitor.  [name].D.

## 2017-11-03 NOTE — Patient Instructions (Addendum)
Medication Instructions:  Your physician has recommended you make the following change in your medication: 1.  Decrease your amiodarone to 200 mg one tablet by mouth daily.  Labwork: None ordered.  Testing/Procedures: None ordered.  Follow-Up: Your physician wants you to follow-up in: 6 months with Dr. Lovena Le.     Any Other Special Instructions Will Be Listed Below (If Applicable).  If you need a refill on your cardiac medications before your next appointment, please call your pharmacy.

## 2017-11-05 ENCOUNTER — Encounter: Payer: Self-pay | Admitting: Internal Medicine

## 2017-11-10 ENCOUNTER — Ambulatory Visit
Admission: RE | Admit: 2017-11-10 | Discharge: 2017-11-10 | Disposition: A | Discharge status: Home / Self Care | Payer: Medicare (Managed Care) | Source: Ambulatory Visit | Attending: Nurse Practitioner | Admitting: Nurse Practitioner

## 2017-11-10 ENCOUNTER — Other Ambulatory Visit: Payer: Self-pay | Admitting: Nurse Practitioner

## 2017-11-10 ENCOUNTER — Other Ambulatory Visit: Payer: Self-pay | Admitting: Family Medicine

## 2017-11-10 DIAGNOSIS — R0602 Shortness of breath: Secondary | ICD-10-CM

## 2017-12-08 ENCOUNTER — Ambulatory Visit
Admission: RE | Admit: 2017-12-08 | Discharge: 2017-12-08 | Disposition: A | Discharge status: Home / Self Care | Payer: Medicare (Managed Care) | Source: Ambulatory Visit | Attending: Nurse Practitioner | Admitting: Nurse Practitioner

## 2017-12-08 ENCOUNTER — Other Ambulatory Visit: Payer: Self-pay | Admitting: Nurse Practitioner

## 2017-12-08 ENCOUNTER — Telehealth: Payer: Self-pay | Admitting: Internal Medicine

## 2017-12-08 DIAGNOSIS — R52 Pain, unspecified: Secondary | ICD-10-CM

## 2017-12-08 NOTE — Telephone Encounter (Signed)
New Message  Patient c/o Palpitations:  High priority if patient c/o lightheadedness, shortness of breath, or chest pain  1) How long have you had palpitations/irregular HR/ Afib? Are you having the symptoms now? Not sure states he fell yesterday and came in today to get an assesment  2) Are you currently experiencing lightheadedness, SOB or CP?   3) Do you have a history of afib (atrial fibrillation) or irregular heart rhythm? yes  4) Have you checked your BP or HR? (document readings if available): 135/79 hr 118  5) Are you experiencing any other symptoms? [facility] and came in today to get an assessment, she did an ekg and noticed he was in afib. Says she will fax over ekg

## 2017-12-08 NOTE — Telephone Encounter (Signed)
EKG received and reviewed by Dr. Lovena Le.  Per Dr. Lovena Le no action needed unless -sob, chest pain or syncopal episode. Pt has h/o Paroxysmal afib. Call placed to [facility].  Advised of above.  Per [facility] Pt feels fine, fell from bed to floor last not but not syncopal episode and no se[place]lea from fall. Advised to notify us if Pt has above.  Rise Paganini indicates understanding.  No further action at this time.

## 2017-12-20 ENCOUNTER — Encounter: Payer: Self-pay | Admitting: Vascular Surgery

## 2017-12-20 ENCOUNTER — Ambulatory Visit (INDEPENDENT_AMBULATORY_CARE_PROVIDER_SITE_OTHER): Payer: Medicare (Managed Care) | Admitting: Vascular Surgery

## 2017-12-20 ENCOUNTER — Other Ambulatory Visit: Payer: Self-pay

## 2017-12-20 ENCOUNTER — Ambulatory Visit (HOSPITAL_COMMUNITY)
Admission: RE | Admit: 2017-12-20 | Discharge: 2017-12-20 | Disposition: A | Discharge status: Home / Self Care | Payer: Medicare (Managed Care) | Source: Ambulatory Visit | Attending: Family Medicine | Admitting: Family Medicine

## 2017-12-20 VITALS — BP 136/59 | HR 58 | Temp 97.0°F | Resp 22 | Ht 61.0 in | Wt 182.0 lb

## 2017-12-20 DIAGNOSIS — I6523 Occlusion and stenosis of bilateral carotid arteries: Secondary | ICD-10-CM

## 2017-12-20 NOTE — Progress Notes (Signed)
Patient name: [name] MRN: [id number] DOB: Jan 20, 1956 Sex: male  REASON FOR VISIT:   Follow-up of bilateral carotid disease.  HPI:   [name] is a pleasant 62 y.o. male who I last saw him on 11/02/2016.  He had a combined left carotid endarterectomy with CABG in January 2017.  I have been following a moderate right carotid stenosis.  When I saw him last a year ago, he did have a recurrent carotid stenosis on the left in the 60 to 79% range of the distal end of the patch.  He also had a 60 to 79% right carotid stenosis.  He comes in for a six-month follow-up visit.  Since I saw him last, he denies any history of stroke, TIAs, expressive or receptive aphasia, or amaurosis fugax.  He was hospitalized recently after developing the flu and actually had to go to rehab after this but is now being discharged home.  Past Medical History:  Diagnosis Date  . Abdominal hernia    Chronic, not a good surgical candidate  . Abscess, abdomen 12/31/2010   Referred to Wound Care in 01/2011 because of multiple abd abscess with VERY large ventral hernia (please look at image of CT abd/pelvis 09/2010).  Because of hernia I was hesitant to I&D.      Marland Kitchen Anemia    History of Iron Def Anemia  . Anxiety   . AVM (arteriovenous malformation)    chronic GI blood loss  . Carotid artery disease ([place])    s/p CEA  . Chronic diastolic CHF (congestive heart failure) (HCC)    takes Lasix  . Chronic low back pain   . COPD (chronic obstructive pulmonary disease) ([place])   . CVA (cerebral infarction) 09/2010   Bilateral with Left > Right  . Diabetes mellitus   . GERD (gastroesophageal reflux disease)   . History of nuclear stress test    Myoview 9/16:  Inferior, apical and inf-lateral ischemia; not gated; High Risk  . Hx of echocardiogram    Echo 5/16:  Mild LVH, EF 55%, indeterm. diast function, WMA could not be ruled out, MAC, trivial MR, mild LAE, normal RVF //  b. Echo 4/17: EF 55-60%, no RWMA, Gr 2  DD, Ao sclerosis, MAC, mild MS, mild LAE, PASP 55 mHg  . Hyperlipidemia   . Hypertension   . Intellectual disability    Sister helps to take care of him and takes him to appts  . Itching    all over body; pt scratches and has sores on bilateral arms and abdomen  . Lung nodule   . Myocardial infarction [facility]) 2016 ?   Heart attack  (  Per  pt. )  . Obesity   . Oxygen dependent    wears 2 liters at bedtime and when needed  . PAF (paroxysmal atrial fibrillation) ([place]) 06/2009   CHADS score 2 (HTN, DM) // Pradaxa for Afib // Pradaxa stopped due to worsening anemia (Hgb in 5/18 8.5 >> Pradaxa remains on hold)   . Pneumonia    hx of  . Stroke ([place])   . Tobacco user    Smokes 1ppd for multiple years.  Quit after hosp 09/2010.  . Tubular adenoma of colon   . Wears glasses     Family History  Problem Relation Age of Onset  . Heart disease Mother   . Hypertension Sister   . Heart disease Brother   . Heart disease Father   . Peripheral vascular disease  Father   . Heart disease Maternal Grandmother   . Heart attack Maternal Grandmother   . Breast cancer Maternal Grandmother   . Stomach cancer Maternal Uncle   . Colon cancer Neg Hx   . Stroke Neg Hx     SOCIAL HISTORY: Social History   Tobacco Use  . Smoking status: Former Smoker    Packs/day: 1.00    Years: 42.00    Pack years: 42.00    Types: Cigarettes, Pipe    Last attempt to quit: 11/10/2010    Years since quitting: 7.1  . Smokeless tobacco: Former Systems developer    Quit date: 10/02/2010  Substance Use Topics  . Alcohol use: No    Alcohol/week: 0.0 oz    Comment: " last drink was 2003" 08/11/15    Allergies  Allergen Reactions  . Penicillins Hives, Nausea And Vomiting, Swelling and Other (See Comments)    Tolerated Cefepime Has patient had a PCN reaction causing immediate rash, facial/tongue/throat swelling, SOB or lightheadedness with hypotension: YES Has patient had a PCN reaction causing severe rash involving mucus membranes  or skin necrosis: No Has patient had a PCN reaction that required hospitalization No Has patient had a PCN reaction occurring within the last 10 years: No If all of the above answers are "NO", then may proceed with Cephalosporin use.     Current Outpatient Medications  Medication Sig Dispense Refill  . acetaminophen (TYLENOL) 325 MG tablet Take 650 mg by mouth 3 (three) times daily as needed for mild pain.    Marland Kitchen amiodarone (PACERONE) 200 MG tablet Take 1 tablet (200 mg total) by mouth daily. 90 tablet 3  . atorvastatin (LIPITOR) 80 MG tablet Take 1 tablet (80 mg total) by mouth daily. 30 tablet 0  . calcium carbonate (TUMS - DOSED IN MG ELEMENTAL CALCIUM) 500 MG chewable tablet Chew 1 tablet by mouth 3 (three) times daily as needed for indigestion or heartburn.    . Cholecalciferol (VITAMIN D3) 1000 UNITS CAPS Take 1,000 Units by mouth daily.    . Darbepoetin Alfa (ARANESP) 150 MCG/0.3ML SOSY injection Inject 0.3 mLs (150 mcg total) into the skin every Wednesday at 6 PM. 1.68 mL 0  . furosemide (LASIX) 80 MG tablet Take 1 tablet (80 mg total) by mouth 2 (two) times daily as needed for fluid. 60 tablet 0  . insulin aspart (NOVOLOG) 100 UNIT/ML injection Inject 0-15 Units into the skin 3 (three) times daily with meals. 10 mL 11  . insulin glargine (LANTUS) 100 UNIT/ML injection Inject 0.14 mLs (14 Units total) into the skin at bedtime. 10 mL 11  . ipratropium-albuterol (DUONEB) 0.5-2.5 (3) MG/3ML SOLN Take 3 mLs by nebulization 2 (two) times daily.    . Maltodextrin-Xanthan Gum (RESOURCE THICKENUP CLEAR) POWD Oral as needed    . multivitamin (RENA-VIT) TABS tablet Take 1 tablet by mouth at bedtime. 30 tablet 0  . nitroGLYCERIN (NITROSTAT) 0.4 MG SL tablet Place 0.4 mg under the tongue every 5 (five) minutes as needed for chest pain.    . Nutritional Supplements (FEEDING SUPPLEMENT, NEPRO CARB STEADY,) LIQD Take 237 mLs by mouth 2 (two) times daily between meals. 1000 mL 0  . pantoprazole  (PROTONIX) 40 MG tablet Take 40 mg by mouth daily.    Marland Kitchen PARoxetine (PAXIL) 40 MG tablet Take 40 mg by mouth daily.     . polyvinyl alcohol (LIQUIFILM TEARS) 1.4 % ophthalmic solution Place 1 drop into both eyes 2 (two) times daily as needed for dry  eyes. Natural Tears    . senna-docusate (SENOKOT-S) 8.6-50 MG tablet Take 1 tablet by mouth at bedtime as needed for mild constipation. 30 tablet 0  . Skin Protectants, Misc. (EUCERIN) cream Apply 1 application topically 2 (two) times daily. TO AFFECTED AREAS     No current facility-administered medications for this visit.     REVIEW OF SYSTEMS:  _0  denotes positive finding, _1  denotes negative finding Cardiac  Comments:  Chest pain or chest pressure:    Shortness of breath upon exertion: x   Short of breath when lying flat:    Irregular heart rhythm:        Vascular    Pain in calf, thigh, or hip brought on by ambulation:    Pain in feet at night that wakes you up from your sleep:     Blood clot in your veins:    Leg swelling:         Pulmonary    Oxygen at home:    Productive cough:     Wheezing:         Neurologic    Sudden weakness in arms or legs:     Sudden numbness in arms or legs:     Sudden onset of difficulty speaking or slurred speech:    Temporary loss of vision in one eye:     Problems with dizziness:         Gastrointestinal    Blood in stool:     Vomited blood:         Genitourinary    Burning when urinating:     Blood in urine:        Psychiatric    Major depression:         Hematologic    Bleeding problems:    Problems with blood clotting too easily:        Skin    Rashes or ulcers:        Constitutional    Fever or chills:     PHYSICAL EXAM:   Vitals:   12/20/17 1301 12/20/17 1310  BP: (!) 100/32 (!) 136/59  Pulse: (!) 58   Resp: (!) 22   Temp: (!) 97 F (36.1 C)   TempSrc: Oral   SpO2: 94%   Weight: 182 lb (82.6 kg)   Height: _2  (1.549 m)     GENERAL: The patient is a  well-nourished male, in no acute distress. The vital signs are documented above. CARDIAC: There is a regular rate and rhythm.  VASCULAR: He has bilateral carotid bruits. PULMONARY: There is good air exchange bilaterally without wheezing or rales. ABDOMEN: Soft and non-tender with normal pitched bowel sounds.  MUSCULOSKELETAL: There are no major deformities or cyanosis. NEUROLOGIC: No focal weakness or paresthesias are detected. SKIN: There are no ulcers or rashes noted. PSYCHIATRIC: The patient has a normal affect.  DATA:    CAROTID DUPLEX: I have independently interpreted his carotid duplex scan today.   On the left side he previously had a recurrent 60 to 79% carotid stenosis at the distal end of the patch.  The velocities have decreased and is now in the 40 to 59% range on the left.  On the right side he has a 60 to 79% carotid stenosis.    The right vertebral artery is antegrade.  The left vertebral artery could not be identified.  MEDICAL ISSUES:   BILATERAL CAROTID STENOSES: This patient has an asymptomatic 60 to 79% right carotid stenosis, and  an asymptomatic recurrent 40 to 59% left carotid stenosis.  He understands we would not consider carotid endarterectomy unless the stenosis progressed to greater than 80% or he develop focal neurologic symptoms.  I have ordered a follow-up carotid duplex scan in 6 months and we will follow this closely.  He is on a statin.  He has not been taking aspirin and I discussed with him the importance of resuming this as soon as possible.  I have asked him to take 81 mg of aspirin daily.  I will see him back in 6 months.  He knows to call sooner if he has problems.  [name] [facility] [phone]

## 2017-12-21 ENCOUNTER — Encounter: Payer: Self-pay | Admitting: Cardiology

## 2018-01-31 ENCOUNTER — Encounter: Payer: Self-pay | Admitting: Cardiology

## 2018-01-31 ENCOUNTER — Telehealth: Payer: Self-pay | Admitting: Internal Medicine

## 2018-01-31 NOTE — Telephone Encounter (Signed)
   Primary Cardiologist:Philip Nahser, MD/Dr. Lovena Le  Chart reviewed as part of pre-operative protocol coverage. Because of Toddy Boyd Leask's past medical history and time since last visit, he/she will require a follow-up visit in order to better assess preoperative cardiovascular risk. He had issues with tachy-brady earlier this year requiring reduction in his amiodarone dose. Phone note 12/2017 with recurrent atrial fib on EKG. Therefore it is prudent for office to see him before clearing.  Pre-op covering staff: - Please schedule appointment and call patient to inform them. - Please contact requesting surgeon's office via preferred method (i.e, phone, fax) to inform them of need for appointment prior to surgery.  Charlie Pitter, PA-C  01/31/2018, 4:58 PM

## 2018-01-31 NOTE — Telephone Encounter (Signed)
New message     If appt needed, call Sonja at [facility] Pre-operative Risk Assessment    Request for surgical clearance:  1. What type of surgery is being performed? Circumcision  2. When is this surgery scheduled? TBD  3. What type of clearance is required (medical clearance vs. Pharmacy clearance to hold med vs. Both)? Both  4. Are there any medications that need to be held prior to surgery and how long?  5. Practice name and name of physician performing surgery? [facility]  6. What is your office phone number [phone]   7.   What is your office fax number [phone]  8.   Anesthesia type (None, local, MAC, general) ? general   Laurier Nancy 01/31/2018, 2:50 PM  _________________________________________________________________   (provider comments below)

## 2018-02-01 NOTE — Telephone Encounter (Signed)
Spoke with Verlee Rossetti [facility], she has been made aware that pt does need an appt before he can be cleared for surgery. Pt has been scheduled for 02/12/18 with Pecolia Ades, PA-C.  She will let the pt know as she sees him today.  Will fax over to surgeon's office to make them aware as well.

## 2018-02-02 ENCOUNTER — Telehealth: Payer: Self-pay | Admitting: Cardiovascular Disease

## 2018-02-02 ENCOUNTER — Encounter: Payer: Self-pay | Admitting: Cardiology

## 2018-02-02 NOTE — Telephone Encounter (Signed)
Patient with diagnosis of ATRIAL FIBRILLATION   Procedure: CIRCUMCISION  Date of procedure: TBD  CHADS2-VASc score of  (CHF, HTN, AGE, DM2, stroke/tia x 2, CAD, AGE, male)  CrCl = 3.08 Platelet count 237  *No Elquis active on medication list and Scr elevated well above 2.5*  Noted appoinment next week with MD for surgical clearance. Needs anticoagulation clarifications for additional recommendations.

## 2018-02-02 NOTE — Telephone Encounter (Signed)
[facility] Pre-operative Risk Assessment    Request for surgical clearance:  1. What type of surgery is being performed? Circumscion  2. When is this surgery scheduled? TBD  3. What type of clearance is required (medical clearance vs. Pharmacy clearance to hold med vs. Both)? Both  4. Are there any medications that need to be held prior to surgery and how long? Eliquis  5. Practice name and name of physician performing surgery? [facility] Dr. Louis Meckel  6. What is your office phone number [phone] ext [phone]   7.   What is your office fax number [phone]  8.   Anesthesia type (None, local, MAC, general) ? General   Nicholes Stairs 02/02/2018, 11:20 AM  _________________________________________________________________   (provider comments below)

## 2018-02-02 NOTE — Telephone Encounter (Signed)
Clarification: No Eliquis or any other anticoagulant noted on recent medication list.

## 2018-02-02 NOTE — Telephone Encounter (Signed)
   Left voicemails on the patient's home and cell phone regarding preoperative evaluation.  We will also route this to pharmacy for their input on holding his Eliquis.   Rosaria Ferries, PA-C 02/02/2018 11:34 AM Beeper [phone] .

## 2018-02-05 NOTE — Telephone Encounter (Signed)
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 He had atrial fib with pauses during hospitalization for the Flu in 09/2017, required temporary pacing for tachy-brady syndrome. He converted to NSR and did not require PPM. He is maintaining SR on amiodarone, seen in follow up by Dr. Caryl Comes 11/03/17. He is not anticoagulated.  The patient has a legal guardian, [name], his sister. I left a message for [name] to call the preop clinic to discuss his current condition.

## 2018-02-07 NOTE — Telephone Encounter (Signed)
Spoke with pt he is aware of upcoming appointment.

## 2018-02-07 NOTE — Telephone Encounter (Signed)
Pt has follow up appt 02/12/18 and needs to keep this for pre-op clearance.  This was arranged 01/31/18 with first clearance note.  Has hx of recurrent a fib 12/2017 and on discharge summary pt was on coumadin.  Please notify pt of appt.  Thanks. You.

## 2018-02-12 ENCOUNTER — Encounter: Payer: Self-pay | Admitting: Cardiology

## 2018-02-12 ENCOUNTER — Other Ambulatory Visit (HOSPITAL_COMMUNITY): Payer: Self-pay | Admitting: *Deleted

## 2018-02-12 ENCOUNTER — Ambulatory Visit (INDEPENDENT_AMBULATORY_CARE_PROVIDER_SITE_OTHER): Payer: Medicare (Managed Care) | Admitting: Cardiology

## 2018-02-12 VITALS — BP 120/66 | HR 60 | Ht 60.0 in | Wt 183.0 lb

## 2018-02-12 DIAGNOSIS — E1159 Type 2 diabetes mellitus with other circulatory complications: Secondary | ICD-10-CM | POA: Diagnosis not present

## 2018-02-12 DIAGNOSIS — I495 Sick sinus syndrome: Secondary | ICD-10-CM | POA: Diagnosis not present

## 2018-02-12 DIAGNOSIS — I5042 Chronic combined systolic (congestive) and diastolic (congestive) heart failure: Secondary | ICD-10-CM

## 2018-02-12 DIAGNOSIS — D509 Iron deficiency anemia, unspecified: Secondary | ICD-10-CM

## 2018-02-12 DIAGNOSIS — Z794 Long term (current) use of insulin: Secondary | ICD-10-CM | POA: Diagnosis not present

## 2018-02-12 DIAGNOSIS — I6523 Occlusion and stenosis of bilateral carotid arteries: Secondary | ICD-10-CM | POA: Diagnosis not present

## 2018-02-12 DIAGNOSIS — Z01810 Encounter for preprocedural cardiovascular examination: Secondary | ICD-10-CM

## 2018-02-12 DIAGNOSIS — I10 Essential (primary) hypertension: Secondary | ICD-10-CM | POA: Diagnosis not present

## 2018-02-12 DIAGNOSIS — Z6835 Body mass index (BMI) 35.0-35.9, adult: Secondary | ICD-10-CM | POA: Diagnosis not present

## 2018-02-12 DIAGNOSIS — E66812 Obesity, class 2: Secondary | ICD-10-CM

## 2018-02-12 DIAGNOSIS — I2510 Atherosclerotic heart disease of native coronary artery without angina pectoris: Secondary | ICD-10-CM

## 2018-02-12 DIAGNOSIS — I480 Paroxysmal atrial fibrillation: Secondary | ICD-10-CM | POA: Diagnosis not present

## 2018-02-12 NOTE — Progress Notes (Signed)
Cardiology Office Note:    Date:  02/12/2018   ID:  [name], DOB Dec 06, 1955, MRN [id number]  PCP:  Joseph Adie, MD  Cardiologist:  Joseph Moores, MD  Referring MD: Joseph Adie, MD   Chief Complaint  Patient presents with  . Follow-up  . Pre-op Exam    History of Present Illness:    [name] is a 62 y.o. male with a past medical history significant for PAF and tachybradycardia syndrome and CAD s/p CABG 08/2015. He also has a history of hypertension, hyperlipidemia, carotid artery disease status post endarterectomy, chronic diastolic CHF, COPD, CVA 2409, diabetes, GERD, midfoot amputation status post diabetic ulcer 2014, iron deficiency anemia, neurotic eczema, massive abdominal wall hernia.  He also has intellectual disability and his sister takes care of him. [facility] 3 hours 3 days per week and his medical care is followed at [facility].  Admitted 10/2014 with NSTEMI in the setting of sepsis syndrome c/b acute hypoxic respiratory failure secondary to a/c diastolic CHF, COPD and AF with RVR. Given his multiple comorbid conditions he was treated conservatively. Then in 03/2015, he was diagnosed withprogressive L carotid artery disease. An abnormal nuclear study prompted a LHC that demonstrated 3 v CAD. He eventually underwent CABG with L-LAD, S-RI, S-OM2 + L CEA on 08/13/15. He had post opAF requiringamiodarone but this was DC'd 2/2 to bradycardia. FUEcho in 11/2015 demonstrated improved LVF with EF 73-53%, mod diastolic dysfunction and mod pulmonary HTN. He was admitted again in 02/2016 with sepsis in the setting of CAP.  EF was 45-50 by echo.  He had elevated Troponin levels and an OP nuclear stress test was done.  This was low risk and neg for ischemia.  His pradaxa was held briefly in the past for anemia. The patient has known small bowel AVMs from prior evaluation.  Plan is to follow Hgb closely and transfuse as necessary.  During hospitalization  in February 2019 for syncope in the setting of influenza complicated by respiratory failure, he was in and out of atrial fibrillation with long pauses and had a temporary pacemaker inserted.  He eventually improved and went back into normal sinus rhythm.  He did not require a permanent pacemaker as in normal sinus rhythm he did not have any more pauses.  He was seen in follow-up by Dr. Lovena Le on 11/03/2017 at which time he was maintaining sinus rhythm and his amiodarone was reduced from 400 mg 200 mg daily. His discharge summary from March 2019 indicated that the patient was on warfarin for stroke risk reduction due to renal insufficiency.  Since that time his renal function has normalized with serum creatinine 1.09 and estimated GFR of 72 mL/min on labs from 12/20/2017 as reported by [facility] [place].  He is therefore now anticoagulated with Eliquis 5 mg bid.  He has carotid artery disease with carotid Dopplers on 12/20/2017 showing 60-79% stenosis of the right ICA and 40-59% stenosis of the left ICA.  Followed by Dr. Doren [name], vascular surgeon.  Dr. Doren [name] noted that he  would not consider carotid endarterectomy unless the stenosis progressed to greater than 80% or he develop focal neurologic symptoms.  The patient had not been taking aspirin and was instructed to take aspirin 81 mg daily.  [name] is here today alone for follow-up and preoperative evaluation prior to a circumcision.  He is feeling a lot better since his hospitalization in February. He walks with a walker and uses  a wheelchair when he goes out.  He denies chest pain or pressure except for occasional ache in the right shoulder area.  He has chronic dyspnea on exertion which he says is worse when he is not using his oxygen, which he only wears as needed.  He does not have significant edema.  His lower legs are wrapped in Unna boots that he says were placed to keep him from scratching.  His forearms are covered in scratches that are covered with  Band-Aids.  He denies palpitations lightheadedness or syncope.   Past Medical History:  Diagnosis Date  . Abdominal hernia    Chronic, not a good surgical candidate  . Abscess, abdomen 12/31/2010   Referred to Wound Care in 01/2011 because of multiple abd abscess with VERY large ventral hernia (please look at image of CT abd/pelvis 09/2010).  Because of hernia I was hesitant to I&D.      [name] Anemia    History of Iron Def Anemia  . Anxiety   . AVM (arteriovenous malformation)    chronic GI blood loss  . Carotid artery disease ([place])    s/p CEA  . Chronic diastolic CHF (congestive heart failure) (HCC)    takes Lasix  . Chronic low back pain   . COPD (chronic obstructive pulmonary disease) ([place])   . CVA (cerebral infarction) 09/2010   Bilateral with Left > Right  . Diabetes mellitus   . GERD (gastroesophageal reflux disease)   . History of nuclear stress test    Myoview 9/16:  Inferior, apical and inf-lateral ischemia; not gated; High Risk  . Hx of echocardiogram    Echo 5/16:  Mild LVH, EF 55%, indeterm. diast function, WMA could not be ruled out, MAC, trivial MR, mild LAE, normal RVF //  b. Echo 4/17: EF 55-60%, no RWMA, Gr 2 DD, Ao sclerosis, MAC, mild MS, mild LAE, PASP 55 mHg  . Hyperlipidemia   . Hypertension   . Intellectual disability    Sister helps to take care of him and takes him to appts  . Itching    all over body; pt scratches and has sores on bilateral arms and abdomen  . Lung nodule   . Myocardial infarction [facility]) 2016 ?   Heart attack  (  Per  pt. )  . Obesity   . Oxygen dependent    wears 2 liters at bedtime and when needed  . PAF (paroxysmal atrial fibrillation) ([place]) 06/2009   CHADS score 2 (HTN, DM) // Pradaxa for Afib // Pradaxa stopped due to worsening anemia (Hgb in 5/18 8.5 >> Pradaxa remains on hold)   . Pneumonia    hx of  . Stroke ([place])   . Tobacco user    Smokes 1ppd for multiple years.  Quit after hosp 09/2010.  . Tubular adenoma of colon   . Wears  glasses     Past Surgical History:  Procedure Laterality Date  . AMPUTATION Right 03/29/2013   Procedure: AMPUTATION RAY;  Surgeon: Newt Minion, MD;  Location: [place];  Service: Orthopedics;  Laterality: Right;  Right Foot 3rd and Possible 4th Ray Amputation  . AMPUTATION Right 04/23/2013   Procedure: AMPUTATION RIGHT MID-FOOT;  Surgeon: Newt Minion, MD;  Location: [place];  Service: Orthopedics;  Laterality: Right;  . arm surgery Left    as a child  . CARDIAC CATHETERIZATION N/A 04/30/2015   Procedure: Left Heart Cath and Coronary Angiography;  Surgeon: Belva Crome, MD;  Location: [facility]  INVASIVE CV LAB;  Service: Cardiovascular;  Laterality: N/A;  . Carotid arteriogram  10/2010   30% right ICA stenosis, 40% left ICA stenosis   . CAROTID ENDARTERECTOMY Left 08-13-15   CEA  . CATARACT EXTRACTION, BILATERAL    . COLONOSCOPY WITH PROPOFOL N/A 06/10/2014   Procedure: COLONOSCOPY WITH PROPOFOL;  Surgeon: Jerene Bears, MD;  Location: [facility];  Service: Gastroenterology;  Laterality: N/A;  . CORONARY ARTERY BYPASS GRAFT N/A 08/13/2015   Procedure: CORONARY ARTERY BYPASS GRAFTING (CABG), ON PUMP, TIMES THREE, USING LEFT INTERNAL MAMMARY ARTERY, RIGHT GREATER SAPHENOUS VEIN HARVESTED ENDOSCOPICALLY;  Surgeon: [name], MD;  Location: [place];  Service: Open Heart Surgery;  Laterality: N/A;  . DEBRIDMENT OF DECUBITUS ULCER Right 02/13/2013  . ENDARTERECTOMY Left 08/13/2015   Procedure: ENDARTERECTOMY CAROTID;  Surgeon: Angelia Mould, MD;  Location: [place];  Service: Vascular;  Laterality: Left;  . ESOPHAGOGASTRODUODENOSCOPY (EGD) WITH PROPOFOL N/A 06/10/2014   Procedure: ESOPHAGOGASTRODUODENOSCOPY (EGD) WITH PROPOFOL;  Surgeon: Jerene Bears, MD;  Location: [facility];  Service: Gastroenterology;  Laterality: N/A;  . GIVENS CAPSULE STUDY N/A 07/09/2014   Procedure: GIVENS CAPSULE STUDY;  Surgeon: Jerene Bears, MD;  Location: [facility];  Service: Gastroenterology;  Laterality: N/A;  . I&D  EXTREMITY Right 02/13/2013   Procedure: IRRIGATION AND DEBRIDEMENT FOOT ULCER;  Surgeon: Johnny Bridge, MD;  Location: [place];  Service: Orthopedics;  Laterality: Right;  PULSE LAVAGE  . INSERTION OF DIALYSIS CATHETER N/A 10/02/2017   Procedure: INSERTION OF TUNNELED DIALYSIS CATHETER;  Surgeon: Elam Dutch, MD;  Location: [facility] OR;  Service: Vascular;  Laterality: N/A;  . IR REMOVAL TUN CV CATH W/O FL  10/25/2017  . MULTIPLE TOOTH EXTRACTIONS    . TEE WITHOUT CARDIOVERSION N/A 08/13/2015   Procedure: TRANSESOPHAGEAL ECHOCARDIOGRAM (TEE);  Surgeon: [name], MD;  Location: [place];  Service: Open Heart Surgery;  Laterality: N/A;  . TEMPORARY PACEMAKER N/A 09/15/2017   Procedure: TEMPORARY PACEMAKER;  Surgeon: Jettie Booze, MD;  Location: [place] CV LAB;  Service: Cardiovascular;  Laterality: N/A;  . TRANSESOPHAGEAL ECHOCARDIOGRAM  09/2010   No ASD or PFO. EF 60-65%.  Normal systolic function. No evidence of thrombus.   . TRANSTHORACIC ECHOCARDIOGRAM  09/2010    The cavity size was normal. Systolic function was vigorous.  EF 65-70%.  Normal wall funciton.   [name] ULTRASOUND GUIDANCE FOR VASCULAR ACCESS  09/15/2017   Procedure: Ultrasound Guidance For Vascular Access;  Surgeon: Jettie Booze, MD;  Location: [place] CV LAB;  Service: Cardiovascular;;    Current Medications: Current Meds  Medication Sig  . acetaminophen (TYLENOL) 325 MG tablet Take 650 mg by mouth 3 (three) times daily as needed for mild pain.  [name] amiodarone (PACERONE) 200 MG tablet Take 1 tablet (200 mg total) by mouth daily.  [name] apixaban (ELIQUIS) 5 MG TABS tablet Take 5 mg by mouth 2 (two) times daily.  [name] aspirin 81 MG tablet Take 81 mg by mouth daily.  [name] atorvastatin (LIPITOR) 80 MG tablet Take 1 tablet (80 mg total) by mouth daily.  . calcium carbonate (TUMS - DOSED IN MG ELEMENTAL CALCIUM) 500 MG chewable tablet Chew 1 tablet by mouth 3 (three) times daily as needed for indigestion or heartburn.  .  Cholecalciferol (VITAMIN D3) 1000 UNITS CAPS Take 1,000 Units by mouth daily.  . Darbepoetin Alfa (ARANESP) 150 MCG/0.3ML SOSY injection Inject 0.3 mLs (150 mcg total) into the skin every Wednesday at 6 PM.  .  furosemide (LASIX) 80 MG tablet Take 1 tablet (80 mg total) by mouth 2 (two) times daily as needed for fluid.  [name] insulin aspart (NOVOLOG) 100 UNIT/ML injection Inject 0-15 Units into the skin 3 (three) times daily with meals.  . insulin glargine (LANTUS) 100 UNIT/ML injection Inject 0.14 mLs (14 Units total) into the skin at bedtime.  [name] ipratropium-albuterol (DUONEB) 0.5-2.5 (3) MG/3ML SOLN Take 3 mLs by nebulization 2 (two) times daily.  . Multiple Vitamin (MULTIVITAMIN) capsule Take 1 capsule by mouth daily.  . multivitamin (RENA-VIT) TABS tablet Take 1 tablet by mouth at bedtime.  . nitroGLYCERIN (NITROSTAT) 0.4 MG SL tablet Place 0.4 mg under the tongue every 5 (five) minutes as needed for chest pain.  . pantoprazole (PROTONIX) 40 MG tablet Take 40 mg by mouth daily.  [name] PARoxetine (PAXIL) 40 MG tablet Take 40 mg by mouth daily.   . polyvinyl alcohol (LIQUIFILM TEARS) 1.4 % ophthalmic solution Place 1 drop into both eyes 2 (two) times daily as needed for dry eyes. Natural Tears  . risperiDONE (RISPERDAL) 0.5 MG tablet Take 0.5 mg by mouth 2 (two) times daily.  [name] senna-docusate (SENOKOT-S) 8.6-50 MG tablet Take 1 tablet by mouth at bedtime as needed for mild constipation.  . Skin Protectants, Misc. (EUCERIN) cream Apply 1 application topically 2 (two) times daily. TO AFFECTED AREAS     Allergies:   Penicillins   Social History   Socioeconomic History  . Marital status: Single    Spouse name: Not on file  . Number of children: 0  . Years of education: Not on file  . Highest education level: Not on file  Occupational History    Employer: DISABLED  Social Needs  . Financial resource strain: Not on file  . Food insecurity:    Worry: Not on file    Inability: Not on file  .  Transportation needs:    Medical: Not on file    Non-medical: Not on file  Tobacco Use  . Smoking status: Former Smoker    Packs/day: 1.00    Years: 42.00    Pack years: 42.00    Types: Cigarettes, Pipe    Last attempt to quit: 11/10/2010    Years since quitting: 7.2  . Smokeless tobacco: Former Systems developer    Quit date: 10/02/2010  Substance and Sexual Activity  . Alcohol use: No    Alcohol/week: 0.0 oz    Comment: " last drink was 2003" 08/11/15  . Drug use: No  . Sexual activity: Not on file  Lifestyle  . Physical activity:    Days per week: Not on file    Minutes per session: Not on file  . Stress: Not on file  Relationships  . Social connections:    Talks on phone: Not on file    Gets together: Not on file    Attends religious service: Not on file    Active member of club or organization: Not on file    Attends meetings of clubs or organizations: Not on file    Relationship status: Not on file  Other Topics Concern  . Not on file  Social History Narrative   Released from prison in 2003 after serving 2 years for indecency with a minor.    He smokes cigarettes greater than 30 years, also smoked a pipe.  Quit tobacco 09/2010.   Denies EtOH and drugs.      Family History: The patient's family history includes Breast cancer in his maternal grandmother;  Heart attack in his maternal grandmother; Heart disease in his brother, father, maternal grandmother, and mother; Hypertension in his sister; Peripheral vascular disease in his father; Stomach cancer in his maternal uncle. There is no history of Colon cancer or Stroke. ROS:   Please see the history of present illness.     All other systems reviewed and are negative.  EKGs/Labs/Other Studies Reviewed:    The following studies were reviewed today:  Carotid ultrasound 12/20/2017 R 60-79%; L 40-59% at the distal parch with patent endarterectomy site.   Echocardiogram 09/18/2017 while in atrial fibrillation LVEF 40-45%, diffuse  hypokinesis, mild LVH, mildly to moderately dilated RV, borderline pulmonary hypertension  Carotid US 9/17 R 40-59; L CEA patent   Myoview 03/07/16 Nuclear stress EF: 55%. There was no ST segment deviation noted during stress. This is a low risk study. The left ventricular ejection fraction is normal (55-65%). Findings consistent with prior myocardial infarction. 1. Fixed medium-sized, moderate-intensity basal to mid inferior perfusion defect. Fixed small, moderate-intensity apical anterior perfusion defect. No ischemia. The inferior defect in particular appears fairly extensive, so hard to label it as artifact, but wall motion appears normal throughout. Cannot rule out prior infarction.  2. Normal LV systolic function and wall motion.  3. Low risk study given lack of ischemia.   Echo 02/15/16 Mild LVH, EF 45-50%, diffuse HK, mild LAE, trivial pericardial effusion  Echo 11/12/15 EF 55-60%, normal wall motion, grade 2 diastolic dysfunction, MAC, mild mitral stenosis, mild LAE, PASP 55 mmHg, moderate pulmonary hypertension  LHC 04/30/15 LAD: Ostial and mid 95%, distal 40%, ostial D1 100%, ostial D2 85% RI: Ostial 65% LCx: Proximal to mid 70%, mid 65%, OM2 90% RCA: Ostial 80%, proximal to distal 100%, distal 100%, RPDA 85%, RPAVB 100% EF 35-45%  Myoview 04/23/15 Large, mostly reversible inferior, apical and inferolateral reversible perfusion defect suggestive of ischemia (SDS 10, Extent 37%). This study was not-gated due to atrial fibrillation. This is a high-risk study based on the size and severity of the perfusion defect.  Carotid US 8/16 R 60-79% L 80-99%  Echo 12/18/14 Mild LVH, EF 55%, wall motion abnormalities could not be excluded, MAC, trivial MR, mild LAE  Echo 10/16/12 Mild LVH, vigorous LVF, EF 65-70%, normal wall motion, grade 1 diastolic dysfunction  Nuclear study 09/06/12 Myoview scan with small apical defect Seen in horizontal images only. Cannot exclude  tiny region of apical ischemia vs shift soft tissue. Otherwise normal perfusion. Overall low risk scan. LV Ejection Fraction: 69%. LV Wall Motion: NL LV Function; NL Wall Motion   EKG:  EKG is ordered today.  The ekg ordered today demonstrates sinus bradycardia with known right bundle branch block, 58 bpm, QTC 490  Recent Labs: 09/29/2017: Magnesium 2.1 09/30/2017: ALT 11 10/25/2017: Hemoglobin 9.8; Platelets 237 10/26/2017: BUN 52; Creatinine, Ser 3.08; Potassium 4.9; Sodium 131   Recent Lipid Panel    Component Value Date/Time   CHOL 99 (L) 07/14/2015 1149   TRIG 156 (H) 07/14/2015 1149   HDL 21 (L) 07/14/2015 1149   CHOLHDL 4.7 07/14/2015 1149   VLDL 31 (H) 07/14/2015 1149   LDLCALC 47 07/14/2015 1149   LDLDIRECT 54 08/23/2012 1447    Physical Exam:    VS:  BP 120/66   Pulse 60   Ht 5' (1.524 m)   Wt 183 lb (83 kg)   SpO2 94%   BMI 35.74 kg/m     Wt Readings from Last 3 Encounters:  02/12/18 183 lb (83 kg)  12/20/17 182 lb (82.6 kg)  11/03/17 173 lb (78.5 kg)     Physical Exam  Constitutional: He is oriented to person, place, and time. No distress.  Chronically ill appearing male in wheelchair  HENT:  Head: Atraumatic.  Neck: Normal range of motion. Neck supple. No JVD present. Carotid bruit is present.  Cardiovascular: Normal rate, regular rhythm and intact distal pulses. Exam reveals no gallop and no friction rub.  Murmur heard.  Harsh midsystolic murmur is present with a grade of 2/6 at the upper right sternal border radiating to the neck. Pulmonary/Chest: Effort normal. No respiratory distress. He has no wheezes. He has no rales.  Diminished breath sounds in the bases  Abdominal: Soft.  Abdomen distended more on right side. Skin tight and shiny with scratches from pt scratching.   Musculoskeletal: Normal range of motion. He exhibits no edema.  Unna boots in place to lower legs.   Neurological: He is alert and oriented to person, place, and time.  Pt is  pleasant and cooperative, but knows very olittle of his health history and no meds. His sister takes care of him but is not here.   Skin: Skin is warm and dry.  bandaids on forearms covering scratches. Unna boots in place to lower legs, pt states to keep him from scratching.   Psychiatric: He has a normal mood and affect. His behavior is normal.     ASSESSMENT:    1. Coronary artery disease involving native coronary artery of native heart without angina pectoris   2. Essential hypertension   3. Tachy-brady syndrome ([place])   4. PAF (paroxysmal atrial fibrillation) ([place])   5. Bilateral carotid artery stenosis   6. Chronic combined systolic and diastolic CHF (congestive heart failure) ([place])   7. Iron deficiency anemia, unspecified iron deficiency anemia type   8. Controlled type 2 diabetes mellitus with other circulatory complication, with long-term current use of insulin (HCC)   9. Class 2 severe obesity due to excess calories with serious comorbidity and body mass index (BMI) of 35.0 to 35.9 in adult ([place])   10. Preop cardiovascular exam    PLAN:    In order of problems listed above:  CAD:  CABG with LIMA-LAD, SVG-RI, SVG-OM2  08/2015.  He had low risk Lexiscan Myoview 02/2016.  Currently no anginal symptoms.  On aspirin, statin, not on BB due to bradycardia. He is compliant with medications, his sister give them to him and they are provided in a bubble pack. He is tolerating them well.  Paroxysmal atrial fibrillation: Maintaining sinus rhythm on amiodarone.  The patient had been on warfarin stroke risk reduction in the setting of acute renal failure.  His renal function has amazingly returned to normal and he is now on Eliquis 5 mg twice daily instead of warfarin.   Hypertension: Blood pressure well controlled on current regimen  Carotid artery disease: He has history of left CEA 08/2015.  Carotid Dopplers on 12/20/2017 showed 60-79% stenosis of the right ICA and 40-59% stenosis of the left  ICA at the distal parch with patent endarterectomy site.  Followed by Dr. Doren [name], vascular surgeon.  Dr. Doren [name] noted that he would not consider carotid endarterectomy unless the stenosis progressed to greater than 80% or he develop focal neurologic symptoms. Dr. Doren [name] added aspirin 81 mg daily.  Chronic combined systolic and diastolic CHF: Does not appear significantly volume overloaded.  Has Lasix 80 mg BID as needed for fluid retention. He is monitored closely at [facility].  CKD: pt had acute renal injury in hospital in 09/2017 and had temporary dialysis access placed but did not need dialysis. His SCr 4.94, came down into the low 1-2 range, however back up to in the mid 4 range in March.  His last serum creatinine in epic was 3.08 on 10/26/2017.  I called the nephrology office of [facility] and they have no record of outpatient follow up after that hospitalization.  He has been followed at [facility] with recent labs showing serum creatinine of 1.09 on 12/20/2017 with estimated GFR of 72 mL/min, electrolytes normal.   Anemia, iron deficiency: Most recent labs on 01/30/2018 showed hemoglobin of 10.4, MCV of 79 low iron binding capacity, low iron, low iron saturation and elevated ferritin level.  He is for Feraheme infusion x2  Type 2 diabetes on insulin: Hemoglobin A1c on 02/06/2018 was 7.4%.  Given his intellectual disability and desire to avoid hypoglycemia, this is acceptable.  Morbid obesity: Body mass index is 35.74 kg/m.  With his intellectual disability and reduced ability, this is not likely to change.  Preoperative cardiac evaluation: Dontrez is planned for a circumcision and there has been a request to hold Eliquis for the procedure.  With prior history of CAD status post CABG, recent acute renal failure, diabetes on insulin, this patient is at a slightly increased risk for any procedure.  He is not currently having any anginal symptoms in this procedure is low risk, therefore, based on ACC/AHA  guidelines, LEA WALBERT would be at acceptable risk for the planned procedure without further cardiovascular testing. He should have close hemodynamic monitoring.  Per our pharmacy protocol he can hold Eliquis for 24 hours prior to his procedure and resume as soon as safe; he has had history of stroke.  I will efax this note for cardiac clearance to the requesting provider.    [facility]    [phone]   Medication Adjustments/Labs and Tests Ordered: Current medicines are reviewed at length with the patient today.  Concerns regarding medicines are outlined above. Labs and tests ordered and medication changes are outlined in the patient instructions below:  Patient Instructions  Medication Instructions: Your physician recommends that you continue on your current medications as directed. Please refer to the Current Medication list given to you today.   Labwork: TODAY: BMET & CBC  Procedures/Testing: None Ordered  Follow-Up: Keep your upcoming appointment with Dr. Lovena Le on 05/03/18 at 10:00 AM    Any Additional Special Instructions Will Be Listed Below (If Applicable).   DASH Eating Plan DASH stands for "Dietary Approaches to Stop Hypertension." The DASH eating plan is a healthy eating plan that has been shown to reduce high blood pressure (hypertension). It may also reduce your risk for type 2 diabetes, heart disease, and stroke. The DASH eating plan may also help with weight loss. What are tips for following this plan? General guidelines  Avoid eating more than 2,300 mg (milligrams) of salt (sodium) a day. If you have hypertension, you may need to reduce your sodium intake to 1,500 mg a day.  Limit alcohol intake to no more than 1 drink a day for nonpregnant women and 2 drinks a day for men. One drink equals 12 oz of beer, 5 oz of wine, or 1 oz of hard liquor.  Work with your health care provider to maintain a healthy body weight or to lose weight.  Ask what an ideal weight is for you.  Get at  least 30 minutes of exercise that causes your heart to beat faster (aerobic exercise) most days of the week. Activities may include walking, swimming, or biking.  Work with your health care provider or diet and nutrition specialist (dietitian) to adjust your eating plan to your individual calorie needs. Reading food labels  Check food labels for the amount of sodium per serving. Choose foods with less than 5 percent of the Daily Value of sodium. Generally, foods with less than 300 mg of sodium per serving fit into this eating plan.  To find whole grains, look for the word "whole" as the first word in the ingredient list. Shopping  Buy products labeled as "low-sodium" or "no salt added."  Buy fresh foods. Avoid canned foods and premade or frozen meals. Cooking  Avoid adding salt when cooking. Use salt-free seasonings or herbs instead of table salt or sea salt. Check with your health care provider or pharmacist before using salt substitutes.  Do not fry foods. Cook foods using healthy methods such as baking, boiling, grilling, and broiling instead.  Cook with heart-healthy oils, such as olive, canola, soybean, or sunflower oil. Meal planning   Eat a balanced diet that includes: ? 5 or more servings of fruits and vegetables each day. At each meal, try to fill half of your plate with fruits and vegetables. ? Up to 6-8 servings of whole grains each day. ? Less than 6 oz of lean meat, poultry, or fish each day. A 3-oz serving of meat is about the same size as a deck of cards. One egg equals 1 oz. ? 2 servings of low-fat dairy each day. ? A serving of nuts, seeds, or beans 5 times each week. ? Heart-healthy fats. Healthy fats called Omega-3 fatty acids are found in foods such as flaxseeds and coldwater fish, like sardines, salmon, and mackerel.  Limit how much you eat of the following: ? Canned or prepackaged foods. ? Food that is high in trans  fat, such as fried foods. ? Food that is high in saturated fat, such as fatty meat. ? Sweets, desserts, sugary drinks, and other foods with added sugar. ? Full-fat dairy products.  Do not salt foods before eating.  Try to eat at least 2 vegetarian meals each week.  Eat more home-cooked food and less restaurant, buffet, and fast food.  When eating at a restaurant, ask that your food be prepared with less salt or no salt, if possible. What foods are recommended? The items listed may not be a complete list. Talk with your dietitian about what dietary choices are best for you. Grains Whole-grain or whole-wheat bread. Whole-grain or whole-wheat pasta. Brown rice. Modena Morrow. Bulgur. Whole-grain and low-sodium cereals. Pita bread. Low-fat, low-sodium crackers. Whole-wheat flour tortillas. Vegetables Fresh or frozen vegetables (raw, steamed, roasted, or grilled). Low-sodium or reduced-sodium tomato and vegetable juice. Low-sodium or reduced-sodium tomato sauce and tomato paste. Low-sodium or reduced-sodium canned vegetables. Fruits All fresh, dried, or frozen fruit. Canned fruit in natural juice (without added sugar). Meat and other protein foods Skinless chicken or [place]. Ground chicken or [place]. Pork with fat trimmed off. Fish and seafood. Egg whites. Dried beans, peas, or lentils. Unsalted nuts, nut butters, and seeds. Unsalted canned beans. Lean cuts of beef with fat trimmed off. Low-sodium, lean deli meat. Dairy Low-fat (1%) or fat-free (skim) milk. Fat-free, low-fat, or reduced-fat cheeses. Nonfat, low-sodium ricotta or cottage cheese. Low-fat or nonfat yogurt. Low-fat, low-sodium cheese. Fats and oils Soft margarine without trans fats. Vegetable  oil. Low-fat, reduced-fat, or light mayonnaise and salad dressings (reduced-sodium). Canola, safflower, olive, soybean, and sunflower oils. Avocado. Seasoning and other foods Herbs. Spices. Seasoning mixes without salt. Unsalted popcorn and  pretzels. Fat-free sweets. What foods are not recommended? The items listed may not be a complete list. Talk with your dietitian about what dietary choices are best for you. Grains Baked goods made with fat, such as croissants, muffins, or some breads. Dry pasta or rice meal packs. Vegetables Creamed or fried vegetables. Vegetables in a cheese sauce. Regular canned vegetables (not low-sodium or reduced-sodium). Regular canned tomato sauce and paste (not low-sodium or reduced-sodium). Regular tomato and vegetable juice (not low-sodium or reduced-sodium). Angie Fava. Olives. Fruits Canned fruit in a light or heavy syrup. Fried fruit. Fruit in cream or butter sauce. Meat and other protein foods Fatty cuts of meat. Ribs. Fried meat. Berniece Salines. Sausage. Bologna and other processed lunch meats. Salami. Fatback. Hotdogs. Bratwurst. Salted nuts and seeds. Canned beans with added salt. Canned or smoked fish. Whole eggs or egg yolks. Chicken or [place] with skin. Dairy Whole or 2% milk, cream, and half-and-half. Whole or full-fat cream cheese. Whole-fat or sweetened yogurt. Full-fat cheese. Nondairy creamers. Whipped toppings. Processed cheese and cheese spreads. Fats and oils Butter. Stick margarine. Lard. Shortening. Ghee. Bacon fat. Tropical oils, such as coconut, palm kernel, or palm oil. Seasoning and other foods Salted popcorn and pretzels. Onion salt, garlic salt, seasoned salt, table salt, and sea salt. Worcestershire sauce. Tartar sauce. Barbecue sauce. Teriyaki sauce. Soy sauce, including reduced-sodium. Steak sauce. Canned and packaged gravies. Fish sauce. Oyster sauce. Cocktail sauce. Horseradish that you find on the shelf. Ketchup. Mustard. Meat flavorings and tenderizers. Bouillon cubes. Hot sauce and Tabasco sauce. Premade or packaged marinades. Premade or packaged taco seasonings. Relishes. Regular salad dressings. Where to find more information:  National Heart, Lung, and [place]:  https://wilson-eaton.com/  American Heart Association: www.heart.org Summary  The DASH eating plan is a healthy eating plan that has been shown to reduce high blood pressure (hypertension). It may also reduce your risk for type 2 diabetes, heart disease, and stroke.  With the DASH eating plan, you should limit salt (sodium) intake to 2,300 mg a day. If you have hypertension, you may need to reduce your sodium intake to 1,500 mg a day.  When on the DASH eating plan, aim to eat more fresh fruits and vegetables, whole grains, lean proteins, low-fat dairy, and heart-healthy fats.  Work with your health care provider or diet and nutrition specialist (dietitian) to adjust your eating plan to your individual calorie needs. This information is not intended to replace advice given to you by your health care provider. Make sure you discuss any questions you have with your health care provider. Document Released: 07/14/2011 Document Revised: 07/18/2016 Document Reviewed: 07/18/2016 Elsevier Interactive Patient Education  [employer].    If you need a refill on your cardiac medications before your next appointment, please call your pharmacy.      Signed, Daune Perch, NP  02/12/2018 5:32 PM    [place]

## 2018-02-12 NOTE — Patient Instructions (Addendum)
Medication Instructions: Your physician recommends that you continue on your current medications as directed. Please refer to the Current Medication list given to you today.   Labwork: TODAY: BMET & CBC  Procedures/Testing: None Ordered  Follow-Up: Keep your upcoming appointment with Dr. Lovena Le on 05/03/18 at 10:00 AM    Any Additional Special Instructions Will Be Listed Below (If Applicable).   DASH Eating Plan DASH stands for "Dietary Approaches to Stop Hypertension." The DASH eating plan is a healthy eating plan that has been shown to reduce high blood pressure (hypertension). It may also reduce your risk for type 2 diabetes, heart disease, and stroke. The DASH eating plan may also help with weight loss. What are tips for following this plan? General guidelines  Avoid eating more than 2,300 mg (milligrams) of salt (sodium) a day. If you have hypertension, you may need to reduce your sodium intake to 1,500 mg a day.  Limit alcohol intake to no more than 1 drink a day for nonpregnant women and 2 drinks a day for men. One drink equals 12 oz of beer, 5 oz of wine, or 1 oz of hard liquor.  Work with your health care provider to maintain a healthy body weight or to lose weight. Ask what an ideal weight is for you.  Get at least 30 minutes of exercise that causes your heart to beat faster (aerobic exercise) most days of the week. Activities may include walking, swimming, or biking.  Work with your health care provider or diet and nutrition specialist (dietitian) to adjust your eating plan to your individual calorie needs. Reading food labels  Check food labels for the amount of sodium per serving. Choose foods with less than 5 percent of the Daily Value of sodium. Generally, foods with less than 300 mg of sodium per serving fit into this eating plan.  To find whole grains, look for the word "whole" as the first word in the ingredient list. Shopping  Buy products labeled as  "low-sodium" or "no salt added."  Buy fresh foods. Avoid canned foods and premade or frozen meals. Cooking  Avoid adding salt when cooking. Use salt-free seasonings or herbs instead of table salt or sea salt. Check with your health care provider or pharmacist before using salt substitutes.  Do not fry foods. Cook foods using healthy methods such as baking, boiling, grilling, and broiling instead.  Cook with heart-healthy oils, such as olive, canola, soybean, or sunflower oil. Meal planning   Eat a balanced diet that includes: ? 5 or more servings of fruits and vegetables each day. At each meal, try to fill half of your plate with fruits and vegetables. ? Up to 6-8 servings of whole grains each day. ? Less than 6 oz of lean meat, poultry, or fish each day. A 3-oz serving of meat is about the same size as a deck of cards. One egg equals 1 oz. ? 2 servings of low-fat dairy each day. ? A serving of nuts, seeds, or beans 5 times each week. ? Heart-healthy fats. Healthy fats called Omega-3 fatty acids are found in foods such as flaxseeds and coldwater fish, like sardines, salmon, and mackerel.  Limit how much you eat of the following: ? Canned or prepackaged foods. ? Food that is high in trans fat, such as fried foods. ? Food that is high in saturated fat, such as fatty meat. ? Sweets, desserts, sugary drinks, and other foods with added sugar. ? Full-fat dairy products.  Do not salt  foods before eating.  Try to eat at least 2 vegetarian meals each week.  Eat more home-cooked food and less restaurant, buffet, and fast food.  When eating at a restaurant, ask that your food be prepared with less salt or no salt, if possible. What foods are recommended? The items listed may not be a complete list. Talk with your dietitian about what dietary choices are best for you. Grains Whole-grain or whole-wheat bread. Whole-grain or whole-wheat pasta. Brown rice. [place] Morrow. Bulgur. Whole-grain  and low-sodium cereals. Pita bread. Low-fat, low-sodium crackers. Whole-wheat flour tortillas. Vegetables Fresh or frozen vegetables (raw, steamed, roasted, or grilled). Low-sodium or reduced-sodium tomato and vegetable juice. Low-sodium or reduced-sodium tomato sauce and tomato paste. Low-sodium or reduced-sodium canned vegetables. Fruits All fresh, dried, or frozen fruit. Canned fruit in natural juice (without added sugar). Meat and other protein foods Skinless chicken or [place]. Ground chicken or [place]. Pork with fat trimmed off. Fish and seafood. Egg whites. Dried beans, peas, or lentils. Unsalted nuts, nut butters, and seeds. Unsalted canned beans. Lean cuts of beef with fat trimmed off. Low-sodium, lean deli meat. Dairy Low-fat (1%) or fat-free (skim) milk. Fat-free, low-fat, or reduced-fat cheeses. Nonfat, low-sodium ricotta or cottage cheese. Low-fat or nonfat yogurt. Low-fat, low-sodium cheese. Fats and oils Soft margarine without trans fats. Vegetable oil. Low-fat, reduced-fat, or light mayonnaise and salad dressings (reduced-sodium). Canola, safflower, olive, soybean, and sunflower oils. Avocado. Seasoning and other foods Herbs. Spices. Seasoning mixes without salt. Unsalted popcorn and pretzels. Fat-free sweets. What foods are not recommended? The items listed may not be a complete list. Talk with your dietitian about what dietary choices are best for you. Grains Baked goods made with fat, such as croissants, muffins, or some breads. Dry pasta or rice meal packs. Vegetables Creamed or fried vegetables. Vegetables in a cheese sauce. Regular canned vegetables (not low-sodium or reduced-sodium). Regular canned tomato sauce and paste (not low-sodium or reduced-sodium). Regular tomato and vegetable juice (not low-sodium or reduced-sodium). Angie Fava. Olives. Fruits Canned fruit in a light or heavy syrup. Fried fruit. Fruit in cream or butter sauce. Meat and other protein foods Fatty cuts  of meat. Ribs. Fried meat. Berniece Salines. Sausage. Bologna and other processed lunch meats. Salami. Fatback. Hotdogs. Bratwurst. Salted nuts and seeds. Canned beans with added salt. Canned or smoked fish. Whole eggs or egg yolks. Chicken or [place] with skin. Dairy Whole or 2% milk, cream, and half-and-half. Whole or full-fat cream cheese. Whole-fat or sweetened yogurt. Full-fat cheese. Nondairy creamers. Whipped toppings. Processed cheese and cheese spreads. Fats and oils Butter. Stick margarine. Lard. Shortening. Ghee. Bacon fat. Tropical oils, such as coconut, palm kernel, or palm oil. Seasoning and other foods Salted popcorn and pretzels. Onion salt, garlic salt, seasoned salt, table salt, and sea salt. Worcestershire sauce. Tartar sauce. Barbecue sauce. Teriyaki sauce. Soy sauce, including reduced-sodium. Steak sauce. Canned and packaged gravies. Fish sauce. Oyster sauce. Cocktail sauce. Horseradish that you find on the shelf. Ketchup. Mustard. Meat flavorings and tenderizers. Bouillon cubes. Hot sauce and Tabasco sauce. Premade or packaged marinades. Premade or packaged taco seasonings. Relishes. Regular salad dressings. Where to find more information:  National Heart, Lung, and [place]: https://wilson-eaton.com/  American Heart Association: www.heart.org Summary  The DASH eating plan is a healthy eating plan that has been shown to reduce high blood pressure (hypertension). It may also reduce your risk for type 2 diabetes, heart disease, and stroke.  With the DASH eating plan, you should limit salt (sodium) intake to 2,300  mg a day. If you have hypertension, you may need to reduce your sodium intake to 1,500 mg a day.  When on the DASH eating plan, aim to eat more fresh fruits and vegetables, whole grains, lean proteins, low-fat dairy, and heart-healthy fats.  Work with your health care provider or diet and nutrition specialist (dietitian) to adjust your eating plan to your individual calorie  needs. This information is not intended to replace advice given to you by your health care provider. Make sure you discuss any questions you have with your health care provider. Document Released: 07/14/2011 Document Revised: 07/18/2016 Document Reviewed: 07/18/2016 Elsevier Interactive Patient Education  [employer].    If you need a refill on your cardiac medications before your next appointment, please call your pharmacy.

## 2018-02-13 ENCOUNTER — Ambulatory Visit (HOSPITAL_COMMUNITY)
Admission: RE | Admit: 2018-02-13 | Discharge: 2018-02-13 | Disposition: A | Discharge status: Home / Self Care | Payer: Medicare (Managed Care) | Source: Ambulatory Visit | Attending: Family Medicine | Admitting: Family Medicine

## 2018-02-13 DIAGNOSIS — D509 Iron deficiency anemia, unspecified: Secondary | ICD-10-CM | POA: Insufficient documentation

## 2018-02-13 MED ORDER — SODIUM CHLORIDE 0.9 % IV SOLN
510.0000 mg | INTRAVENOUS | Status: DC
  Administered 2018-02-13: 11:00:00 510 mg via INTRAVENOUS
  Filled 2018-02-13: qty 17

## 2018-02-15 ENCOUNTER — Other Ambulatory Visit: Payer: Self-pay | Admitting: Urology

## 2018-02-20 ENCOUNTER — Ambulatory Visit (HOSPITAL_COMMUNITY)
Admission: RE | Admit: 2018-02-20 | Discharge: 2018-02-20 | Disposition: A | Discharge status: Home / Self Care | Payer: Medicare (Managed Care) | Source: Ambulatory Visit | Attending: Family Medicine | Admitting: Family Medicine

## 2018-02-20 DIAGNOSIS — D509 Iron deficiency anemia, unspecified: Secondary | ICD-10-CM | POA: Diagnosis present

## 2018-02-20 MED ORDER — SODIUM CHLORIDE 0.9 % IV SOLN
510.0000 mg | INTRAVENOUS | Status: AC
  Administered 2018-02-20: 11:00:00 510 mg via INTRAVENOUS
  Filled 2018-02-20: qty 17

## 2018-03-01 NOTE — Progress Notes (Signed)
Rich Reining at [facility] made aware on 02/28/2018 and on 03/01/2018 that PST has not heard back from anyone at both numbers of [phone] and [phone] after multiple phone calls and left messages.

## 2018-03-02 ENCOUNTER — Ambulatory Visit (HOSPITAL_COMMUNITY): Payer: Medicare (Managed Care) | Admitting: Anesthesiology

## 2018-03-02 ENCOUNTER — Ambulatory Visit (HOSPITAL_COMMUNITY)
Admission: RE | Admit: 2018-03-02 | Discharge: 2018-03-02 | Disposition: A | Discharge status: Home Health | Payer: Medicare (Managed Care) | Source: Ambulatory Visit | Attending: Urology | Admitting: Urology

## 2018-03-02 ENCOUNTER — Encounter (HOSPITAL_COMMUNITY)
Admission: RE | Disposition: A | Discharge status: Home Health | Payer: Self-pay | Source: Ambulatory Visit | Attending: Urology

## 2018-03-02 ENCOUNTER — Other Ambulatory Visit: Payer: Self-pay

## 2018-03-02 ENCOUNTER — Encounter (HOSPITAL_COMMUNITY): Payer: Self-pay | Admitting: Registered Nurse

## 2018-03-02 DIAGNOSIS — E669 Obesity, unspecified: Secondary | ICD-10-CM | POA: Insufficient documentation

## 2018-03-02 DIAGNOSIS — M545 Low back pain: Secondary | ICD-10-CM | POA: Insufficient documentation

## 2018-03-02 DIAGNOSIS — E785 Hyperlipidemia, unspecified: Secondary | ICD-10-CM | POA: Insufficient documentation

## 2018-03-02 DIAGNOSIS — I2510 Atherosclerotic heart disease of native coronary artery without angina pectoris: Secondary | ICD-10-CM | POA: Insufficient documentation

## 2018-03-02 DIAGNOSIS — E1151 Type 2 diabetes mellitus with diabetic peripheral angiopathy without gangrene: Secondary | ICD-10-CM | POA: Insufficient documentation

## 2018-03-02 DIAGNOSIS — F79 Unspecified intellectual disabilities: Secondary | ICD-10-CM | POA: Diagnosis not present

## 2018-03-02 DIAGNOSIS — G8929 Other chronic pain: Secondary | ICD-10-CM | POA: Diagnosis not present

## 2018-03-02 DIAGNOSIS — Z6835 Body mass index (BMI) 35.0-35.9, adult: Secondary | ICD-10-CM | POA: Insufficient documentation

## 2018-03-02 DIAGNOSIS — Z8673 Personal history of transient ischemic attack (TIA), and cerebral infarction without residual deficits: Secondary | ICD-10-CM | POA: Insufficient documentation

## 2018-03-02 DIAGNOSIS — F419 Anxiety disorder, unspecified: Secondary | ICD-10-CM | POA: Insufficient documentation

## 2018-03-02 DIAGNOSIS — I252 Old myocardial infarction: Secondary | ICD-10-CM | POA: Insufficient documentation

## 2018-03-02 DIAGNOSIS — Z794 Long term (current) use of insulin: Secondary | ICD-10-CM | POA: Insufficient documentation

## 2018-03-02 DIAGNOSIS — N471 Phimosis: Secondary | ICD-10-CM | POA: Diagnosis not present

## 2018-03-02 DIAGNOSIS — I4510 Unspecified right bundle-branch block: Secondary | ICD-10-CM | POA: Diagnosis not present

## 2018-03-02 DIAGNOSIS — L900 Lichen sclerosus et atrophicus: Secondary | ICD-10-CM | POA: Insufficient documentation

## 2018-03-02 DIAGNOSIS — Z87891 Personal history of nicotine dependence: Secondary | ICD-10-CM | POA: Diagnosis not present

## 2018-03-02 DIAGNOSIS — E7800 Pure hypercholesterolemia, unspecified: Secondary | ICD-10-CM | POA: Diagnosis not present

## 2018-03-02 DIAGNOSIS — N35819 Other urethral stricture, male, unspecified site: Secondary | ICD-10-CM | POA: Insufficient documentation

## 2018-03-02 DIAGNOSIS — M069 Rheumatoid arthritis, unspecified: Secondary | ICD-10-CM | POA: Insufficient documentation

## 2018-03-02 DIAGNOSIS — F329 Major depressive disorder, single episode, unspecified: Secondary | ICD-10-CM | POA: Insufficient documentation

## 2018-03-02 DIAGNOSIS — I4891 Unspecified atrial fibrillation: Secondary | ICD-10-CM | POA: Insufficient documentation

## 2018-03-02 DIAGNOSIS — Z7982 Long term (current) use of aspirin: Secondary | ICD-10-CM | POA: Insufficient documentation

## 2018-03-02 DIAGNOSIS — Z880 Allergy status to penicillin: Secondary | ICD-10-CM | POA: Insufficient documentation

## 2018-03-02 DIAGNOSIS — Z79899 Other long term (current) drug therapy: Secondary | ICD-10-CM | POA: Insufficient documentation

## 2018-03-02 DIAGNOSIS — I509 Heart failure, unspecified: Secondary | ICD-10-CM | POA: Diagnosis not present

## 2018-03-02 DIAGNOSIS — I110 Hypertensive heart disease with heart failure: Secondary | ICD-10-CM | POA: Insufficient documentation

## 2018-03-02 DIAGNOSIS — Z9981 Dependence on supplemental oxygen: Secondary | ICD-10-CM | POA: Insufficient documentation

## 2018-03-02 DIAGNOSIS — J449 Chronic obstructive pulmonary disease, unspecified: Secondary | ICD-10-CM | POA: Diagnosis not present

## 2018-03-02 DIAGNOSIS — Z7901 Long term (current) use of anticoagulants: Secondary | ICD-10-CM | POA: Insufficient documentation

## 2018-03-02 LAB — GLUCOSE, CAPILLARY
GLUCOSE-CAPILLARY: 180 mg/dL — AB (ref 70–99)
Glucose-Capillary: 193 mg/dL — ABNORMAL HIGH (ref 70–99)

## 2018-03-02 LAB — CBC
HEMATOCRIT: 41.3 % (ref 39.0–52.0)
Hemoglobin: 12.3 g/dL — ABNORMAL LOW (ref 13.0–17.0)
MCH: 24.5 pg — ABNORMAL LOW (ref 26.0–34.0)
MCHC: 29.8 g/dL — ABNORMAL LOW (ref 30.0–36.0)
MCV: 82.3 fL (ref 78.0–100.0)
Platelets: 270 10*3/uL (ref 150–400)
RBC: 5.02 MIL/uL (ref 4.22–5.81)
RDW: 18.2 % — ABNORMAL HIGH (ref 11.5–15.5)
WBC: 10.5 10*3/uL (ref 4.0–10.5)

## 2018-03-02 LAB — BASIC METABOLIC PANEL
ANION GAP: 9 (ref 5–15)
BUN: 49 mg/dL — ABNORMAL HIGH (ref 8–23)
CO2: 25 mmol/L (ref 22–32)
Calcium: 8.7 mg/dL — ABNORMAL LOW (ref 8.9–10.3)
Chloride: 99 mmol/L (ref 98–111)
Creatinine, Ser: 2.15 mg/dL — ABNORMAL HIGH (ref 0.61–1.24)
GFR calc Af Amer: 36 mL/min — ABNORMAL LOW (ref >60)
GFR calc non Af Amer: 31 mL/min — ABNORMAL LOW (ref >60)
GLUCOSE: 199 mg/dL — AB (ref 70–99)
POTASSIUM: 5.1 mmol/L (ref 3.5–5.1)
Sodium: 133 mmol/L — ABNORMAL LOW (ref 135–145)

## 2018-03-02 SURGERY — DORSAL SLIT, PREPUCE
Anesthesia: Monitor Anesthesia Care

## 2018-03-02 MED ORDER — LIDOCAINE HCL (PF) 1 % IJ SOLN
INTRAMUSCULAR | Status: AC
  Filled 2018-03-02: qty 30

## 2018-03-02 MED ORDER — LIDOCAINE 2% (20 MG/ML) 5 ML SYRINGE
INTRAMUSCULAR | Status: AC
  Filled 2018-03-02: qty 5

## 2018-03-02 MED ORDER — BUPIVACAINE-EPINEPHRINE (PF) 0.25% -1:200000 IJ SOLN
INTRAMUSCULAR | Status: AC
  Filled 2018-03-02: qty 30

## 2018-03-02 MED ORDER — BACITRACIN ZINC 500 UNIT/GM EX OINT
TOPICAL_OINTMENT | CUTANEOUS | Status: AC
  Filled 2018-03-02: qty 28.35

## 2018-03-02 MED ORDER — ONDANSETRON HCL 4 MG/2ML IJ SOLN
INTRAMUSCULAR | Status: AC
  Filled 2018-03-02: qty 2

## 2018-03-02 MED ORDER — PROPOFOL 10 MG/ML IV BOLUS
INTRAVENOUS | Status: DC | PRN
  Administered 2018-03-02: 10 mg via INTRAVENOUS
  Administered 2018-03-02: 20 mg via INTRAVENOUS
  Administered 2018-03-02 (×6): 10 mg via INTRAVENOUS
  Administered 2018-03-02 (×2): 20 mg via INTRAVENOUS

## 2018-03-02 MED ORDER — LIDOCAINE HCL (PF) 1 % IJ SOLN
INTRAMUSCULAR | Status: DC | PRN
  Administered 2018-03-02: 26 mL

## 2018-03-02 MED ORDER — MIDAZOLAM HCL 2 MG/2ML IJ SOLN
INTRAMUSCULAR | Status: AC
  Filled 2018-03-02: qty 2

## 2018-03-02 MED ORDER — ONDANSETRON HCL 4 MG/2ML IJ SOLN
INTRAMUSCULAR | Status: DC | PRN
  Administered 2018-03-02: 4 mg via INTRAVENOUS

## 2018-03-02 MED ORDER — TRAMADOL HCL 50 MG PO TABS: 50.0000 mg | ORAL_TABLET | Freq: Four times a day (QID) | ORAL | 0 refills | Status: DC | PRN

## 2018-03-02 MED ORDER — LACTATED RINGERS IV SOLN
INTRAVENOUS | Status: DC
  Administered 2018-03-02: 09:00:00 via INTRAVENOUS

## 2018-03-02 MED ORDER — FENTANYL CITRATE (PF) 100 MCG/2ML IJ SOLN
INTRAMUSCULAR | Status: AC
  Filled 2018-03-02: qty 2

## 2018-03-02 MED ORDER — NYSTATIN 100000 UNIT/GM EX POWD
CUTANEOUS | Status: AC
  Administered 2018-03-02: 15 g via TOPICAL
  Filled 2018-03-02: qty 15

## 2018-03-02 MED ORDER — NYSTATIN 100000 UNIT/GM EX POWD: Freq: Four times a day (QID) | CUTANEOUS | 0 refills | Status: DC

## 2018-03-02 MED ORDER — VANCOMYCIN HCL IN DEXTROSE 1-5 GM/200ML-% IV SOLN
1000.0000 mg | Freq: Once | INTRAVENOUS | Status: AC
  Administered 2018-03-02: 1000 mg via INTRAVENOUS

## 2018-03-02 MED ORDER — FENTANYL CITRATE (PF) 100 MCG/2ML IJ SOLN
INTRAMUSCULAR | Status: DC | PRN
  Administered 2018-03-02 (×2): 50 ug via INTRAVENOUS

## 2018-03-02 MED ORDER — FENTANYL CITRATE (PF) 100 MCG/2ML IJ SOLN: 25.0000 ug | INTRAMUSCULAR | Status: DC | PRN

## 2018-03-02 MED ORDER — ONDANSETRON HCL 4 MG/2ML IJ SOLN: 4.0000 mg | Freq: Once | INTRAMUSCULAR | Status: DC | PRN

## 2018-03-02 MED ORDER — PROPOFOL 10 MG/ML IV BOLUS
INTRAVENOUS | Status: AC
  Filled 2018-03-02: qty 20

## 2018-03-02 MED ORDER — LIDOCAINE 2% (20 MG/ML) 5 ML SYRINGE
INTRAMUSCULAR | Status: DC | PRN
  Administered 2018-03-02: 40 mg via INTRAVENOUS

## 2018-03-02 MED ORDER — SODIUM CHLORIDE 0.9 % IR SOLN
Status: DC | PRN
  Administered 2018-03-02: 1000 mL

## 2018-03-02 MED ORDER — BUPIVACAINE-EPINEPHRINE 0.25% -1:200000 IJ SOLN
INTRAMUSCULAR | Status: DC | PRN
  Administered 2018-03-02: 30 mL

## 2018-03-02 MED ORDER — VANCOMYCIN HCL IN DEXTROSE 1-5 GM/200ML-% IV SOLN
INTRAVENOUS | Status: AC
  Administered 2018-03-02: 1000 mg via INTRAVENOUS
  Filled 2018-03-02: qty 200

## 2018-03-02 MED ORDER — MIDAZOLAM HCL 5 MG/5ML IJ SOLN
INTRAMUSCULAR | Status: DC | PRN
  Administered 2018-03-02: 2 mg via INTRAVENOUS

## 2018-03-02 SURGICAL SUPPLY — 22 items
ADH SKN CLS APL DERMABOND .7 (GAUZE/BANDAGES/DRESSINGS) ×1
BLADE SURG 15 STRL LF DISP TIS (BLADE) ×1 IMPLANT
BLADE SURG 15 STRL SS (BLADE) ×3
BNDG COHESIVE 1X5 TAN STRL LF (GAUZE/BANDAGES/DRESSINGS) ×1 IMPLANT
COVER SURGICAL LIGHT HANDLE (MISCELLANEOUS) ×3 IMPLANT
DERMABOND ADVANCED (GAUZE/BANDAGES/DRESSINGS) ×2
DERMABOND ADVANCED .7 DNX12 (GAUZE/BANDAGES/DRESSINGS) IMPLANT
DRAPE LAPAROTOMY T 98X78 PEDS (DRAPES) ×3 IMPLANT
ELECT PENCIL ROCKER SW 15FT (MISCELLANEOUS) ×3 IMPLANT
ELECT REM PT RETURN 15FT ADLT (MISCELLANEOUS) ×3 IMPLANT
GAUZE PETROLATUM 1 X8 (GAUZE/BANDAGES/DRESSINGS) ×1 IMPLANT
GLOVE BIOGEL M STRL SZ7.5 (GLOVE) ×3 IMPLANT
GOWN STRL REUS W/TWL LRG LVL3 (GOWN DISPOSABLE) ×1 IMPLANT
GOWN STRL REUS W/TWL XL LVL3 (GOWN DISPOSABLE) ×4 IMPLANT
KIT BASIN OR (CUSTOM PROCEDURE TRAY) ×3 IMPLANT
NEEDLE HYPO 22GX1.5 SAFETY (NEEDLE) ×3 IMPLANT
PACK BASIC VI WITH GOWN DISP (CUSTOM PROCEDURE TRAY) ×3 IMPLANT
SUT CHROMIC 3 0 PS 2 (SUTURE) IMPLANT
SUT CHROMIC 3 0 SH 27 (SUTURE) ×6 IMPLANT
TOWEL OR 17X26 10 PK STRL BLUE (TOWEL DISPOSABLE) ×3 IMPLANT
TOWEL OR NON WOVEN STRL DISP B (DISPOSABLE) ×3 IMPLANT
TUBING INSUFFLATION 10FT LAP (TUBING) IMPLANT

## 2018-03-02 NOTE — Anesthesia Procedure Notes (Signed)
Date/Time: 03/02/2018 11:00 AM Performed by: Talbot Grumbling, CRNA Oxygen Delivery Method: Simple face mask

## 2018-03-02 NOTE — Op Note (Signed)
Preoperative diagnosis:  1. phimosis   Postoperative diagnosis:  1. Same   Procedure: 1. Dorsal slit  Surgeon: Ardis Hughs, MD  Anesthesia: MAC  Complications: None  Intraoperative findings: tight phimotic ring, diffuse inguinal rash consistent with fungal infection.  EBL: Minimal  Specimens: None  Indication: [name] is a 62 y.o. patient with phimosis and unable to retract [name] foreskin causing recurrent infections and/or poor hygiene.  After reviewing the management options for treatment, [name] elected to proceed with the above surgical procedure(s). We have discussed the potential benefits and risks of the procedure, side effects of the proposed treatment, the likelihood of the patient achieving the goals of the procedure, and any potential problems that might occur during the procedure or recuperation. Informed consent has been obtained.  Procedure:  After  anesthesia was induced the patient was prepped and draped in the routine sterile fashion.  The patient had a diffuse fungal rash along [name] inguinal creases creeping into the scrotum.  There is also quite a bit of rash underneath [name] pannus.  This was all cleaned with Hibiclens and then re-sterilized with Betadine.  A penile block was then performed with local anesthetic.  Using a straight clamp made a ischemic skin depression at the 12 o'clock position of the foreskin down past the glands.  This was approximately 3 cm long.  Using bedside cut along the cleft tissue resulting in minimal bleeding.  I then started at the apex of the incision with 3-0 chromic and in the running hemostatic stitch ran along the foreskin closing the skin.  I then started at the apex and ran down to the adjacent side.  There is very minimal bleeding.  This point I opted to place Dermabond along the incision after re-sterilizing everything once the gland had been exposed.  I then slathered the surrounding area with bacitracin.  We then sprinkled  nystatin antifungal powder along the rash.   The patient was subsequently awoken and returned to PACU in excellent condition. There no immediate complications.

## 2018-03-02 NOTE — Transfer of Care (Signed)
Immediate Anesthesia Transfer of Care Note  Patient: [name]  Procedure(s) Performed: Dorsal Slit (N/A )  Patient Location: PACU  Anesthesia Type:MAC  Level of Consciousness: sedated  Airway & Oxygen Therapy: Patient Spontanous Breathing and Patient connected to face mask oxygen  Post-op Assessment: Report given to RN and Post -op Vital signs reviewed and stable  Post vital signs: Reviewed and stable  Last Vitals:  Vitals Value Taken Time  BP    Temp    Pulse    Resp 17 03/02/2018 12:03 PM  SpO2    Vitals shown include unvalidated device data.  Last Pain:  Vitals:   03/02/18 0828  TempSrc: Oral      Patients Stated Pain Goal: 3 (24/40/10 2725)  Complications: No apparent anesthesia complications

## 2018-03-02 NOTE — Discharge Instructions (Signed)
Postoperative instructions for dorsal slit/circumcision  Wound:  In most cases your incision will have absorbable sutures that run along the course of your incision and will dissolve within the first 10-20 days. Some will fall out even earlier. Expect some redness as the sutures dissolved but this should occur only around the sutures. If there is generalized redness, especially with increasing pain or swelling, let us know. The penis will very likely get "black and blue" as the blood in the tissues spread. Sometimes the whole penis will turn colors. The black and blue is followed by a yellow and brown color. In time, all the discoloration will go away.  Apply bacitracin or neosporin ointment (over-the-counter) to incision twice a daily.  Place an inch long bead of the ointment to your finger and the lather the incision with the ointment.  Apply the anti-fungal powder to the groin/scrotum and pannus area four times daily.  Diet:  You may return to your normal diet within 24 hours following your surgery. You may note some mild nausea and possibly vomiting the first 6-8 hours following surgery. This is usually due to the side effects of anesthesia, and will disappear quite soon. I would suggest clear liquids and a very light meal the first evening following your surgery.  Activity:  Your physical activity should be restricted the first 48 hours. During that time you should remain relatively inactive, moving about only when necessary. During the first 7-10 days following surgery he should avoid lifting any heavy objects (anything greater than 15 pounds), and avoid strenuous exercise. If you work, ask [place] specifically about your restrictions, both for work and home. We will write a note to your employer if needed.  Ice packs can be placed on and off over the penis for the first 48 hours to help relieve the pain and keep the swelling down. Frozen peas or corn in a ZipLock bag can be frozen, used and  re-frozen. Fifteen minutes on and 15 minutes off is a reasonable schedule.   Hygiene:  You may shower 48 hours after your surgery. Tub bathing should be restricted until the seventh day.  Medication:  You will be sent home with some type of pain medication. In many cases you will be sent home with a narcotic pain pill (Vicodin or Tylox). If the pain is not too bad, you may take either Tylenol (acetaminophen) or Advil (ibuprofen) which contain no narcotic agents, and might be tolerated a little better, with fewer side effects. If the pain medication you are sent home with does not control the pain, you will have to let us know. Some narcotic pain medications cannot be given or refilled by a phone call to a pharmacy.  Apply bacitracin to wound twice a day.  Restart Eliquis in 48 hours.  Problems you should report to [place]:   Fever of 101.0 degrees Fahrenheit or greater.  Moderate or severe swelling under the skin incision or involving the scrotum.  Drug reaction such as hives, a rash, nausea or vomiting.

## 2018-03-02 NOTE — Anesthesia Preprocedure Evaluation (Addendum)
Anesthesia Evaluation  Patient identified by MRN, date of birth, ID band Patient awake    Reviewed: Allergy & Precautions, NPO status , Patient's Chart, lab work & pertinent test results  Airway Mallampati: II  TM Distance: >3 FB Neck ROM: Full    Dental  (+) Edentulous Lower, Edentulous Upper   Pulmonary COPD,  COPD inhaler and oxygen dependent, former smoker,    Pulmonary exam normal breath sounds clear to auscultation       Cardiovascular hypertension, Pt. on medications + CAD, + Past MI, + Peripheral Vascular Disease and +CHF  Normal cardiovascular exam+ dysrhythmias Atrial Fibrillation  Rhythm:Regular Rate:Normal  ECG: SB, RBBB, rate 58  ECHO:  The patient was in atrial fibrillation. Normal LV size with mild LV hypertrophy. EF 40-45%. Mild-moderate RV dilation with mildly decreased systolic function. Borderline pulmonary hypertension.   Neuro/Psych PSYCHIATRIC DISORDERS Anxiety Intellectual disability CVA, No Residual Symptoms    GI/Hepatic Neg liver ROS, GERD  Medicated and Controlled,  Endo/Other  diabetes  Renal/GU Renal disease     Musculoskeletal Chronic low back pain   Abdominal (+) + obese,   Peds  Hematology HLD   Anesthesia Other Findings PHIMOSIS  Reproductive/Obstetrics                            Anesthesia Physical Anesthesia Plan  ASA: IV  Anesthesia Plan: MAC   Post-op Pain Management:    Induction: Intravenous  PONV Risk Score and Plan: 1 and Propofol infusion and Treatment may vary due to age or medical condition  Airway Management Planned: Simple Face Mask  Additional Equipment:   Intra-op Plan:   Post-operative Plan:   Informed Consent: I have reviewed the patients History and Physical, chart, labs and discussed the procedure including the risks, benefits and alternatives for the proposed anesthesia with the patient or authorized representative who has  indicated his/her understanding and acceptance.   Dental advisory given  Plan Discussed with: CRNA  Anesthesia Plan Comments:         Anesthesia Quick Evaluation

## 2018-03-02 NOTE — Progress Notes (Signed)
Attempted to call linda autwell (sister) who is power of attorney (per pt)

## 2018-03-02 NOTE — H&P (Signed)
Seen for penis problems  HPI: [name] is a 62 year-old male established patient who is here for further eval and management of his penile issues.  He first noticed the symptoms approximately 01/07/2008.   He does have difficulty retracting his foreskin. The patient denies an ulceration/rash on his penis. He has not been treated with creams, ointments, or other medications.   He does have a history of urinary infections. He does not have dysuria.   He denies any pain/swelling in his inguinal region. He does not haveswelling. He has had a history of trauma to the urethra . He does have dysuria.   Initially seen in Feb 2019 as consult for difficult foley when he was intubated in ICU for the flu. 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 He has since recoverd and his catheter has been removed. The patient is voiding without difficulty.     NIH Symptom Score: He has experienced pain tip of the penis.     ALLERGIES: Penicillin    MEDICATIONS: Aspirin 81 mg tablet,chewable  Acetaminophen 325 mg tablet  Amiodarone Hcl 200 mg tablet  Aranesp 150 mcg/0.3 ml syringe  Atorvastatin Calcium 80 mg tablet  Calcium Carbonate  Eliquis 5 mg tablet  Eucerin cream  Furosemide  Hydrocodone-Acetaminophen  Ipratropium-Albuterol  Lantus Solostar 100 unit/ml (3 ml) insulin pen  Multivitamin  Nitroglycerin 0.4 mg tablet, sublingual  Paroxetine Hcl 40 mg tablet  Protonix 40 mg tablet, delayed release  Risperidone 0.25 mg tablet  Senokot  Tylenol  Vitamin D3     GU PSH: None   NON-GU PSH: Heart Surgery (Unspecified)    GU PMH: Nocturia - 12/29/2017 Phimosis - 12/29/2017 Urinary Retention - 11/15/2017, - 11/01/2017    NON-GU PMH: Pyuria/other UA findings - 12/29/2017 Acquired hemolytic anemia, unspecified Acute sinusitis, unspecified Anaphylactic shock, unspecified, initial encounter Anxiety COPD Coronary Artery Disease Depression Diabetes Type  2 Erythema intertrigo Gout Hypercholesterolemia Hyperkalemia Lumbago with sciatica, unspecified side Rheumatoid Arthritis    FAMILY HISTORY: Breast Cancer - Grandmother Heart Disease - Mother, Father, Brother Hypertension - Sister   SOCIAL HISTORY: Marital Status: Single Preferred Language: English; Ethnicity: Not Hispanic Or Latino; Race: White Current Smoking Status: Patient does not smoke anymore. Smoked 1 pack per day.   Tobacco Use Assessment Completed: Used Tobacco in last 30 days? Does not use drugs.    REVIEW OF SYSTEMS:    GU Review Male:   Patient denies frequent urination, hard to postpone urination, burning/ pain with urination, get up at night to urinate, leakage of urine, stream starts and stops, trouble starting your stream, have to strain to urinate , erection problems, and penile pain.  Gastrointestinal (Upper):   Patient denies nausea, vomiting, and indigestion/ heartburn.  Gastrointestinal (Lower):   Patient reports diarrhea. Patient denies constipation.  Constitutional:   Patient denies fever, night sweats, weight loss, and fatigue.  Skin:   Patient denies skin rash/ lesion and itching.  Eyes:   Patient denies blurred vision and double vision.  Ears/ Nose/ Throat:   Patient denies sore throat and sinus problems.  Hematologic/Lymphatic:   Patient denies swollen glands and easy bruising.  Cardiovascular:   Patient denies leg swelling and chest pains.  Respiratory:   Patient denies cough and shortness of breath.  Endocrine:   Patient denies excessive thirst.  Musculoskeletal:   Patient denies back pain and joint pain.  Neurological:   Patient denies dizziness and headaches.  Psychologic:   Patient denies depression and  anxiety.   VITAL SIGNS:      01/30/2018 09:46 AM  Weight 286 lb / 129.73 kg  Height 60 in / 152.4 cm  BP 163/72 mmHg  Pulse 60 /min  Temperature 98.2 F / 36.7 C  BMI 55.8 kg/m   GU PHYSICAL EXAMINATION:    Penis: Penis uncircumcised,  phimosis. Balanitis, meatal stenosis. No foreskin warts, no cracks. No dorsal peyronie's plaques, no left corporal peyronie's plaques, no right corporal peyronie's plaques, no scarring, no shaft warts.    MULTI-SYSTEM PHYSICAL EXAMINATION:    Constitutional: Well-nourished. No physical deformities. Normally developed. Good grooming.  Respiratory: No labored breathing, no use of accessory muscles. Normal breath sounds.  Cardiovascular: Regular rate and rhythm. No murmur, no gallop. Normal temperature, normal extremity pulses, no swelling, no varicosities.  Gastrointestinal: Very large ventral hernia undulating to the right     PAST DATA REVIEWED:  Source Of History:  Patient  Records Review:   Previous Doctor Records, Previous Hospital Records, Previous Patient Records, POC Tool   PROCEDURES:           PVR Ultrasound - 56979  Scanned Volume: 0 cc         Urinalysis w/Scope Dipstick Dipstick Cont'd Micro  Color: Yellow Bilirubin: Neg WBC/hpf: NS (Not Seen)  Appearance: Clear Ketones: Neg RBC/hpf: 0 - 2/hpf  Specific Gravity: 1.015 Blood: Neg Bacteria: NS (Not Seen)  pH: 6.0 Protein: 2+ Cystals: NS (Not Seen)  Glucose: Neg Urobilinogen: 0.2 Casts: NS (Not Seen)    Nitrites: Neg Trichomonas: Not Present    Leukocyte Esterase: Neg Mucous: Not Present      Epithelial Cells: 0 - 5/hpf      Yeast: NS (Not Seen)      Sperm: Not Present    ASSESSMENT:      ICD-10 Details  1 GU:   Phimosis - N47.1    PLAN:           Document Letter(s):  Created for Patient: Clinical Summary         Notes:   The patient has a phimosis and unretractable foreskin. Noted in the hospital at the time of Foley catheter placement 3 months prior is that the patient also has lichen sclerosis with meatal stenosis.   I spoke to the patient about the treatment options and at this point have recommended that we proceed with circumcision. This will make the lichen sclerosus far easier to treat and potentially  slow the process down. I discussed the surgery with him in detail including the risk and the benefits. He understands that this is general anesthesia and that we'll need to get cardiac clearance for him. We also discussed the risks of infection, bleeding, and undesirable results. Having gone through all the surgery including the risk and the benefits the patient is opted to proceed.

## 2018-03-02 NOTE — Progress Notes (Addendum)
Pt arrived for surgery- discovered poor hygiene- socks stuck to legs. Required water to loosen them. Pt has open wounds and skin break down on legs and abd. States wounds are "from scratching" .  He reports [facility] takes care of him. He is challenged and unable to give more information regarding skin/wound care. Sister is legal person per patient and is on way to hospital. MD will be notified upon arrival.

## 2018-03-02 NOTE — Progress Notes (Signed)
Spoke to pt's sister [name] (who pt lives with) She is unaware of pt's skin wounds. She also stated "he    Has a caregiver that comes and bathes him" . She is going to discuss his care with her primary doctor. She is also unaware of his medications but told him "he has been off his blood thinner for two days."

## 2018-03-02 NOTE — Progress Notes (Signed)
Called [facility] transportation to be sure to transport patient on Oxygen.  Patient states he wears 2 liters of oxygen at home at times.  I instructed both his sister, who lives with him, and the patient that he needs to wear his O2 for the next 2 days.  Dr Roanna Banning is aware that patient will be wearing his oxygen and will be discharged home to his sister once [facility] transportation arrives

## 2018-03-02 NOTE — Anesthesia Postprocedure Evaluation (Signed)
Anesthesia Post Note  Patient: [name]  Procedure(s) Performed: Dorsal Slit (N/A )     Patient location during evaluation: PACU Anesthesia Type: MAC Level of consciousness: awake and alert Pain management: pain level controlled Vital Signs Assessment: post-procedure vital signs reviewed and stable Respiratory status: spontaneous breathing, nonlabored ventilation, respiratory function stable and patient connected to nasal cannula oxygen Cardiovascular status: stable and blood pressure returned to baseline Postop Assessment: no apparent nausea or vomiting Anesthetic complications: no    Last Vitals:  Vitals:   03/02/18 1315 03/02/18 1330  BP: (!) 118/56 (!) 117/52  Pulse: 60 (!) 59  Resp: (!) 25 (!) 22  Temp: 36.6 C 36.6 C  SpO2: 95% 95%    Last Pain:  Vitals:   03/02/18 1330  TempSrc:   PainSc: 0-No pain                 [name] Stamour P Rebeckah Masih

## 2018-05-03 ENCOUNTER — Ambulatory Visit (INDEPENDENT_AMBULATORY_CARE_PROVIDER_SITE_OTHER): Payer: Medicare (Managed Care) | Admitting: Internal Medicine

## 2018-05-03 ENCOUNTER — Encounter: Payer: Self-pay | Admitting: Internal Medicine

## 2018-05-03 VITALS — BP 132/70 | HR 55 | Ht 60.0 in | Wt 152.0 lb

## 2018-05-03 DIAGNOSIS — I10 Essential (primary) hypertension: Secondary | ICD-10-CM | POA: Diagnosis not present

## 2018-05-03 DIAGNOSIS — I495 Sick sinus syndrome: Secondary | ICD-10-CM

## 2018-05-03 DIAGNOSIS — I480 Paroxysmal atrial fibrillation: Secondary | ICD-10-CM

## 2018-05-03 NOTE — Patient Instructions (Signed)

## 2018-05-03 NOTE — Progress Notes (Signed)
HPI 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 He was admitted to the hospital back in February with syncope and was diagnosed with influenza complicated by respiratory failure. He would go in and out of atrial fib with long pauses and had a temporary PM inserted. He eventually improved and went back to NSR. He did not require a PPM as in NSR he did not have more pauses. He has slowly improved although he is still weak. No additional syncope and he denies palpitations. He has been on amio 200 mg daily. He was admitted to the hospital with phimosis 2 months ago.  Allergies  Allergen Reactions  . Penicillins Hives, Nausea And Vomiting, Swelling and Other (See Comments)    Tolerated Cefepime Has patient had a PCN reaction causing immediate rash, facial/tongue/throat swelling, SOB or lightheadedness with hypotension: YES Has patient had a PCN reaction causing severe rash involving mucus membranes or skin necrosis: No Has patient had a PCN reaction that required hospitalization No Has patient had a PCN reaction occurring within the last 10 years: No If all of the above answers are "NO", then may proceed with Cephalosporin use.      Current Outpatient Medications  Medication Sig Dispense Refill  . acetaminophen (TYLENOL) 325 MG tablet Take 650 mg by mouth 3 (three) times daily as needed for mild pain.    Marland Kitchen amiodarone (PACERONE) 200 MG tablet Take 1 tablet (200 mg total) by mouth daily. 90 tablet 3  . apixaban (ELIQUIS) 5 MG TABS tablet Take 5 mg by mouth 2 (two) times daily.    Marland Kitchen atorvastatin (LIPITOR) 80 MG tablet Take 1 tablet (80 mg total) by mouth daily. 30 tablet 0  . calcium carbonate (TUMS - DOSED IN MG ELEMENTAL CALCIUM) 500 MG chewable tablet Chew 1 tablet by mouth 3 (three) times daily as needed for indigestion or heartburn.    . Cholecalciferol (VITAMIN D3) 1000 UNITS CAPS Take 1,000 Units by mouth daily.    . Darbepoetin Alfa (ARANESP) 150  MCG/0.3ML SOSY injection Inject 0.3 mLs (150 mcg total) into the skin every Wednesday at 6 PM. 1.68 mL 0  . insulin glargine (LANTUS) 100 UNIT/ML injection Inject 0.14 mLs (14 Units total) into the skin at bedtime. 10 mL 11  . ipratropium-albuterol (DUONEB) 0.5-2.5 (3) MG/3ML SOLN Take 3 mLs by nebulization 2 (two) times daily.    . Multiple Vitamin (MULTIVITAMIN) capsule Take 1 capsule by mouth daily.    . multivitamin (RENA-VIT) TABS tablet Take 1 tablet by mouth at bedtime. 30 tablet 0  . nitroGLYCERIN (NITROSTAT) 0.4 MG SL tablet Place 0.4 mg under the tongue every 5 (five) minutes as needed for chest pain.    Marland Kitchen nystatin (MYCOSTATIN/NYSTOP) powder Apply topically 4 (four) times daily. Apply to inguinal area/scrotum and under pannus 15 g 0  . pantoprazole (PROTONIX) 40 MG tablet Take 40 mg by mouth daily.    Marland Kitchen PARoxetine (PAXIL) 40 MG tablet Take 40 mg by mouth daily.     . polyvinyl alcohol (LIQUIFILM TEARS) 1.4 % ophthalmic solution Place 1 drop into both eyes 2 (two) times daily as needed for dry eyes. Natural Tears    . risperiDONE (RISPERDAL) 0.5 MG tablet Take 0.5 mg by mouth 2 (two) times daily.    Marland Kitchen senna-docusate (SENOKOT-S) 8.6-50 MG tablet Take 1 tablet by mouth at bedtime as needed for mild constipation. 30 tablet 0  . Skin Protectants, Misc. (EUCERIN) cream Apply 1 application  topically 2 (two) times daily. TO AFFECTED AREAS    . traMADol (ULTRAM) 50 MG tablet Take 1-2 tablets (50-100 mg total) by mouth every 6 (six) hours as needed for moderate pain. 15 tablet 0  . furosemide (LASIX) 80 MG tablet Take 1 tablet (80 mg total) by mouth 2 (two) times daily as needed for fluid. 60 tablet 0   No current facility-administered medications for this visit.      Past Medical History:  Diagnosis Date  . Abdominal hernia    Chronic, not a good surgical candidate  . Abscess, abdomen 12/31/2010   Referred to Wound Care in 01/2011 because of multiple abd abscess with VERY large ventral hernia  (please look at image of CT abd/pelvis 09/2010).  Because of hernia I was hesitant to I&D.      Marland Kitchen Anemia    History of Iron Def Anemia  . Anxiety   . AVM (arteriovenous malformation)    chronic GI blood loss  . Carotid artery disease ([place])    s/p CEA  . Chronic diastolic CHF (congestive heart failure) (H[facility])    takes Lasix  . Chronic low back pain   . COPD (chronic obstructive pulmonary disease) ([place])   . CVA (cerebral infarction) 09/2010   Bilateral with Left > Right  . Diabetes mellitus   . GERD (gastroesophageal reflux disease)   . History of nuclear stress test    Myoview 9/16:  Inferior, apical and inf-lateral ischemia; not gated; High Risk  . Hx of echocardiogram    Echo 5/16:  Mild LVH, EF 55%, indeterm. diast function, WMA could not be ruled out, MAC, trivial MR, mild LAE, normal RVF //  b. Echo 4/17: EF 55-60%, no RWMA, Gr 2 DD, Ao sclerosis, MAC, mild MS, mild LAE, PASP 55 mHg  . Hyperlipidemia   . Hypertension   . Intellectual disability    Sister helps to take care of him and takes him to appts  . Itching    all over body; pt scratches and has sores on bilateral arms and abdomen  . Lung nodule   . Myocardial infarction [facility]) 2016 ?   Heart attack  (  Per  pt. )  . Obesity   . Oxygen dependent    wears 2 liters at bedtime and when needed  . PAF (paroxysmal atrial fibrillation) ([place]) 06/2009   CHADS score 2 (HTN, DM) // Pradaxa for Afib // Pradaxa stopped due to worsening anemia (Hgb in 5/18 8.5 >> Pradaxa remains on hold)   . Pneumonia    hx of  . Stroke ([place])   . Tobacco user    Smokes 1ppd for multiple years.  Quit after hosp 09/2010.  . Tubular adenoma of colon   . Wears glasses     ROS:   All systems reviewed and negative except as noted in the HPI.   Past Surgical History:  Procedure Laterality Date  . AMPUTATION Right 03/29/2013   Procedure: AMPUTATION RAY;  Surgeon: Newt Minion, MD;  Location: [place];  Service: Orthopedics;  Laterality: Right;  Right  Foot 3rd and Possible 4th Ray Amputation  . AMPUTATION Right 04/23/2013   Procedure: AMPUTATION RIGHT MID-FOOT;  Surgeon: Newt Minion, MD;  Location: [facility];  Service: Orthopedics;  Laterality: Right;  . arm surgery Left    as a child  . CARDIAC CATHETERIZATION N/A 04/30/2015   Procedure: Left Heart Cath and Coronary Angiography;  Surgeon: Belva Crome, MD;  Location: [place]  CV [facility];  Service: Cardiovascular;  Laterality: N/A;  . Carotid arteriogram  10/2010   30% right ICA stenosis, 40% left ICA stenosis   . CAROTID ENDARTERECTOMY Left 08-13-15   CEA  . CATARACT EXTRACTION, BILATERAL    . COLONOSCOPY WITH PROPOFOL N/A 06/10/2014   Procedure: COLONOSCOPY WITH PROPOFOL;  Surgeon: Jerene Bears, MD;  Location: [facility];  Service: Gastroenterology;  Laterality: N/A;  . CORONARY ARTERY BYPASS GRAFT N/A 08/13/2015   Procedure: CORONARY ARTERY BYPASS GRAFTING (CABG), ON PUMP, TIMES THREE, USING LEFT INTERNAL MAMMARY ARTERY, RIGHT GREATER SAPHENOUS VEIN HARVESTED ENDOSCOPICALLY;  Surgeon: [name], MD;  Location: [place];  Service: Open Heart Surgery;  Laterality: N/A;  . DEBRIDMENT OF DECUBITUS ULCER Right 02/13/2013  . ENDARTERECTOMY Left 08/13/2015   Procedure: ENDARTERECTOMY CAROTID;  Surgeon: Angelia Mould, MD;  Location: [place];  Service: Vascular;  Laterality: Left;  . ESOPHAGOGASTRODUODENOSCOPY (EGD) WITH PROPOFOL N/A 06/10/2014   Procedure: ESOPHAGOGASTRODUODENOSCOPY (EGD) WITH PROPOFOL;  Surgeon: Jerene Bears, MD;  Location: [facility];  Service: Gastroenterology;  Laterality: N/A;  . GIVENS CAPSULE STUDY N/A 07/09/2014   Procedure: GIVENS CAPSULE STUDY;  Surgeon: Jerene Bears, MD;  Location: [facility];  Service: Gastroenterology;  Laterality: N/A;  . I&D EXTREMITY Right 02/13/2013   Procedure: IRRIGATION AND DEBRIDEMENT FOOT ULCER;  Surgeon: Johnny Bridge, MD;  Location: [place];  Service: Orthopedics;  Laterality: Right;  PULSE LAVAGE  . INSERTION OF DIALYSIS CATHETER N/A  10/02/2017   Procedure: INSERTION OF TUNNELED DIALYSIS CATHETER;  Surgeon: Elam Dutch, MD;  Location[facility] OR;  Service: Vascular;  Laterality: N/A;  . IR REMOVAL TUN CV CATH W/O FL  10/25/2017  . MULTIPLE TOOTH EXTRACTIONS    . TEE WITHOUT CARDIOVERSION N/A 08/13/2015   Procedure: TRANSESOPHAGEAL ECHOCARDIOGRAM (TEE);  Surgeon: [name], MD;  Location: [place];  Service: Open Heart Surgery;  Laterality: N/A;  . TEMPORARY PACEMAKER N/A 09/15/2017   Procedure: TEMPORARY PACEMAKER;  Surgeon: Jettie Booze, MD;  Location: [place] [facility];  Service: Cardiovascular;  Laterality: N/A;  . TRANSESOPHAGEAL ECHOCARDIOGRAM  09/2010   No ASD or PFO. EF 60-65%.  Normal systolic function. No evidence of thrombus.   . TRANSTHORACIC ECHOCARDIOGRAM  09/2010    The cavity size was normal. Systolic function was vigorous.  EF 65-70%.  Normal wall funciton.   Marland Kitchen ULTRASOUND GUIDANCE FOR VASCULAR ACCESS  09/15/2017   Procedure: Ultrasound Guidance For Vascular Access;  Surgeon: Jettie Booze, MD;  Location: [place] [facility];  Service: Cardiovascular;;     Family History  Problem Relation Age of Onset  . Heart disease Mother   . Hypertension Sister   . Heart disease Brother   . Heart disease Father   . Peripheral vascular disease Father   . Heart disease Maternal Grandmother   . Heart attack Maternal Grandmother   . Breast cancer Maternal Grandmother   . Stomach cancer Maternal Uncle   . Colon cancer Neg Hx   . Stroke Neg Hx      Social History   Socioeconomic History  . Marital status: Single    Spouse name: Not on file  . Number of children: 0  . Years of education: Not on file  . Highest education level: Not on file  Occupational History    Employer: DISABLED  Social Needs  . Financial resource strain: Not on file  . Food insecurity:    Worry: Not on file    Inability:  Not on file  . Transportation needs:    Medical: Not on file    Non-medical: Not on file  Tobacco Use    . Smoking status: Former Smoker    Packs/day: 1.00    Years: 42.00    Pack years: 42.00    Types: Cigarettes, Pipe    Last attempt to quit: 11/10/2010    Years since quitting: 7.4  . Smokeless tobacco: Former Systems developer    Quit date: 10/02/2010  Substance and Sexual Activity  . Alcohol use: No    Alcohol/week: 0.0 standard drinks    Comment: " last drink was 2003" 08/11/15  . Drug use: No  . Sexual activity: Not on file  Lifestyle  . Physical activity:    Days per week: Not on file    Minutes per session: Not on file  . Stress: Not on file  Relationships  . Social connections:    Talks on phone: Not on file    Gets together: Not on file    Attends religious service: Not on file    Active member of club or organization: Not on file    Attends meetings of clubs or organizations: Not on file    Relationship status: Not on file  . Intimate partner violence:    Fear of current or ex partner: Not on file    Emotionally abused: Not on file    Physically abused: Not on file    Forced sexual activity: Not on file  Other Topics Concern  . Not on file  Social History Narrative   Released from prison in 2003 after serving 2 years for indecency with a minor.    He smokes cigarettes greater than 30 years, also smoked a pipe.  Quit tobacco 09/2010.   Denies EtOH and drugs.      BP 132/70   Pulse (!) 55   Ht 5' (1.524 m)   Wt 152 lb (68.9 kg)   SpO2 97%   BMI 29.69 kg/m   Physical Exam:  Chronically ill appearing 62 yo man, NAD HEENT: Unremarkable Neck:  6 cm JVD, no thyromegally Lymphatics:  No adenopathy Back:  No CVA tenderness Lungs:  Clear with no wheezes HEART:  Regular rate rhythm, no murmurs, no rubs, no clicks Abd:  soft, obese, positive bowel sounds, no organomegally, no rebound, no guarding Ext:  2 plus pulses, no edema, no cyanosis, no clubbing Skin:  No rashes no nodules Neuro:  CN II through XII intact, motor grossly intact  EKG - nsr with right ward axis and  RBBB   Assess/Plan: 1. PAF - he is maintaining NSR with amiodarone. He will continue 200 mg daily 2. HTN - his blood pressure is reasonably well controlled. He will continue his current meds. 3. Diastolic CHF - he appears to be euvolemic today. He will continue his current meds.  Mikle Bosworth.D.

## 2018-06-14 ENCOUNTER — Other Ambulatory Visit: Payer: Self-pay

## 2018-06-14 DIAGNOSIS — I6523 Occlusion and stenosis of bilateral carotid arteries: Secondary | ICD-10-CM

## 2018-06-27 ENCOUNTER — Ambulatory Visit: Payer: Medicare (Managed Care) | Admitting: Vascular Surgery

## 2018-06-27 ENCOUNTER — Encounter (HOSPITAL_COMMUNITY): Payer: Medicare (Managed Care)

## 2018-08-07 ENCOUNTER — Other Ambulatory Visit (HOSPITAL_COMMUNITY): Payer: Self-pay | Admitting: *Deleted

## 2018-08-09 ENCOUNTER — Ambulatory Visit (HOSPITAL_COMMUNITY)
Admission: RE | Admit: 2018-08-09 | Discharge: 2018-08-09 | Disposition: A | Discharge status: Home / Self Care | Payer: Medicare (Managed Care) | Source: Ambulatory Visit | Attending: Family Medicine | Admitting: Family Medicine

## 2018-08-09 DIAGNOSIS — D509 Iron deficiency anemia, unspecified: Secondary | ICD-10-CM | POA: Diagnosis present

## 2018-08-09 MED ORDER — SODIUM CHLORIDE 0.9 % IV SOLN
510.0000 mg | INTRAVENOUS | Status: DC
  Administered 2018-08-09: 510 mg via INTRAVENOUS
  Filled 2018-08-09: qty 17

## 2018-08-16 ENCOUNTER — Ambulatory Visit (HOSPITAL_COMMUNITY)
Admission: RE | Admit: 2018-08-16 | Discharge: 2018-08-16 | Disposition: A | Discharge status: Home / Self Care | Payer: Medicare (Managed Care) | Source: Ambulatory Visit | Attending: Family Medicine | Admitting: Family Medicine

## 2018-08-16 DIAGNOSIS — D509 Iron deficiency anemia, unspecified: Secondary | ICD-10-CM | POA: Diagnosis not present

## 2018-08-16 MED ORDER — SODIUM CHLORIDE 0.9 % IV SOLN
510.0000 mg | INTRAVENOUS | Status: DC
  Administered 2018-08-16: 510 mg via INTRAVENOUS
  Filled 2018-08-16: qty 510

## 2018-08-29 ENCOUNTER — Encounter: Payer: Self-pay | Admitting: Vascular Surgery

## 2018-08-29 ENCOUNTER — Encounter (HOSPITAL_COMMUNITY): Payer: Medicare (Managed Care)

## 2018-08-29 ENCOUNTER — Ambulatory Visit: Payer: Medicare (Managed Care) | Admitting: Vascular Surgery

## 2018-09-05 IMAGING — CR DG CHEST 2V
2 series · 2 of 2 positions shown · non-contrast
Comparison: Portable chest x-ray of 10/02/2016 and chest x-ray of
09/18/2017

CLINICAL DATA: History of pulmonary edema, end-stage renal disease,
COPD

EXAM:
CHEST  2 VIEW

[chest lat]
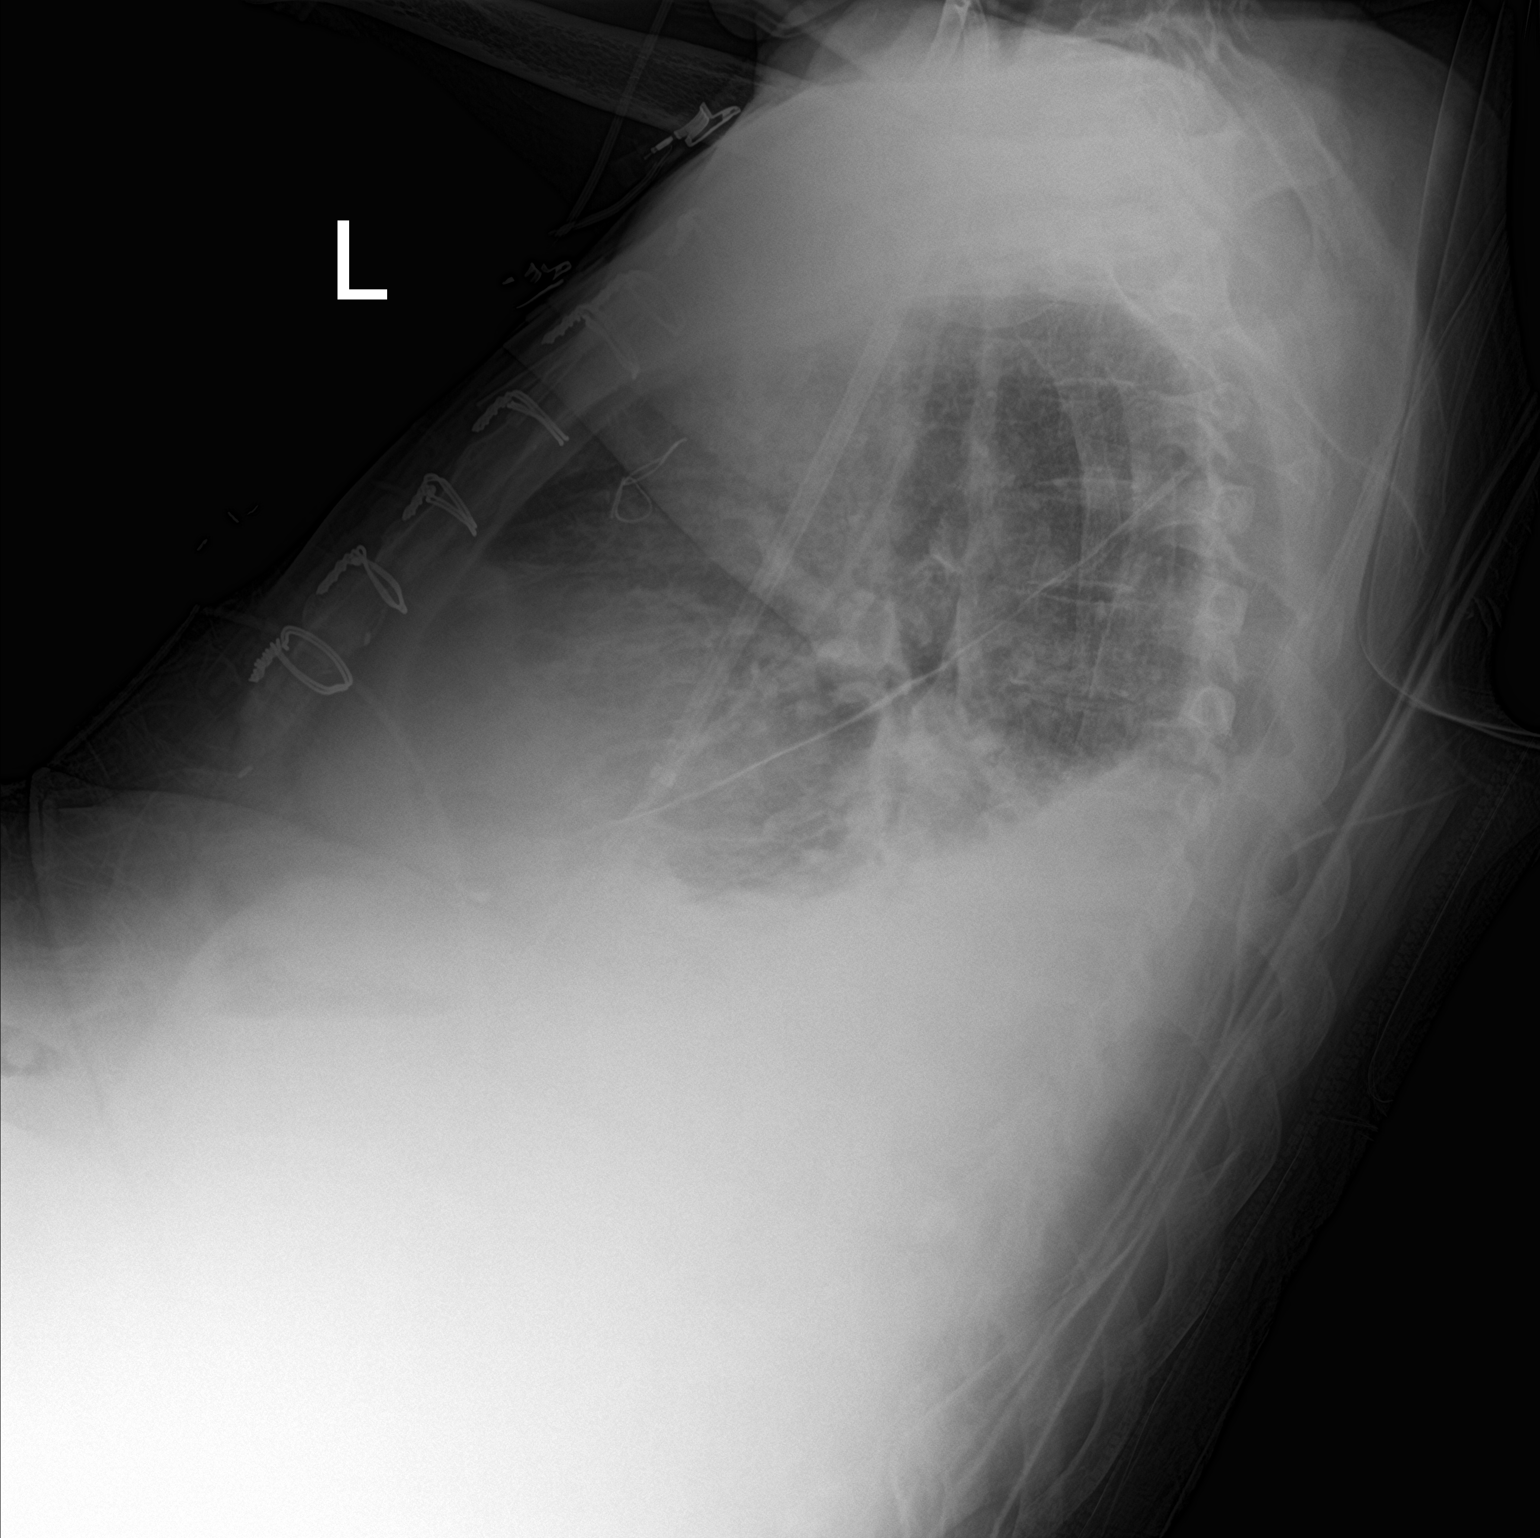

[chest ap]
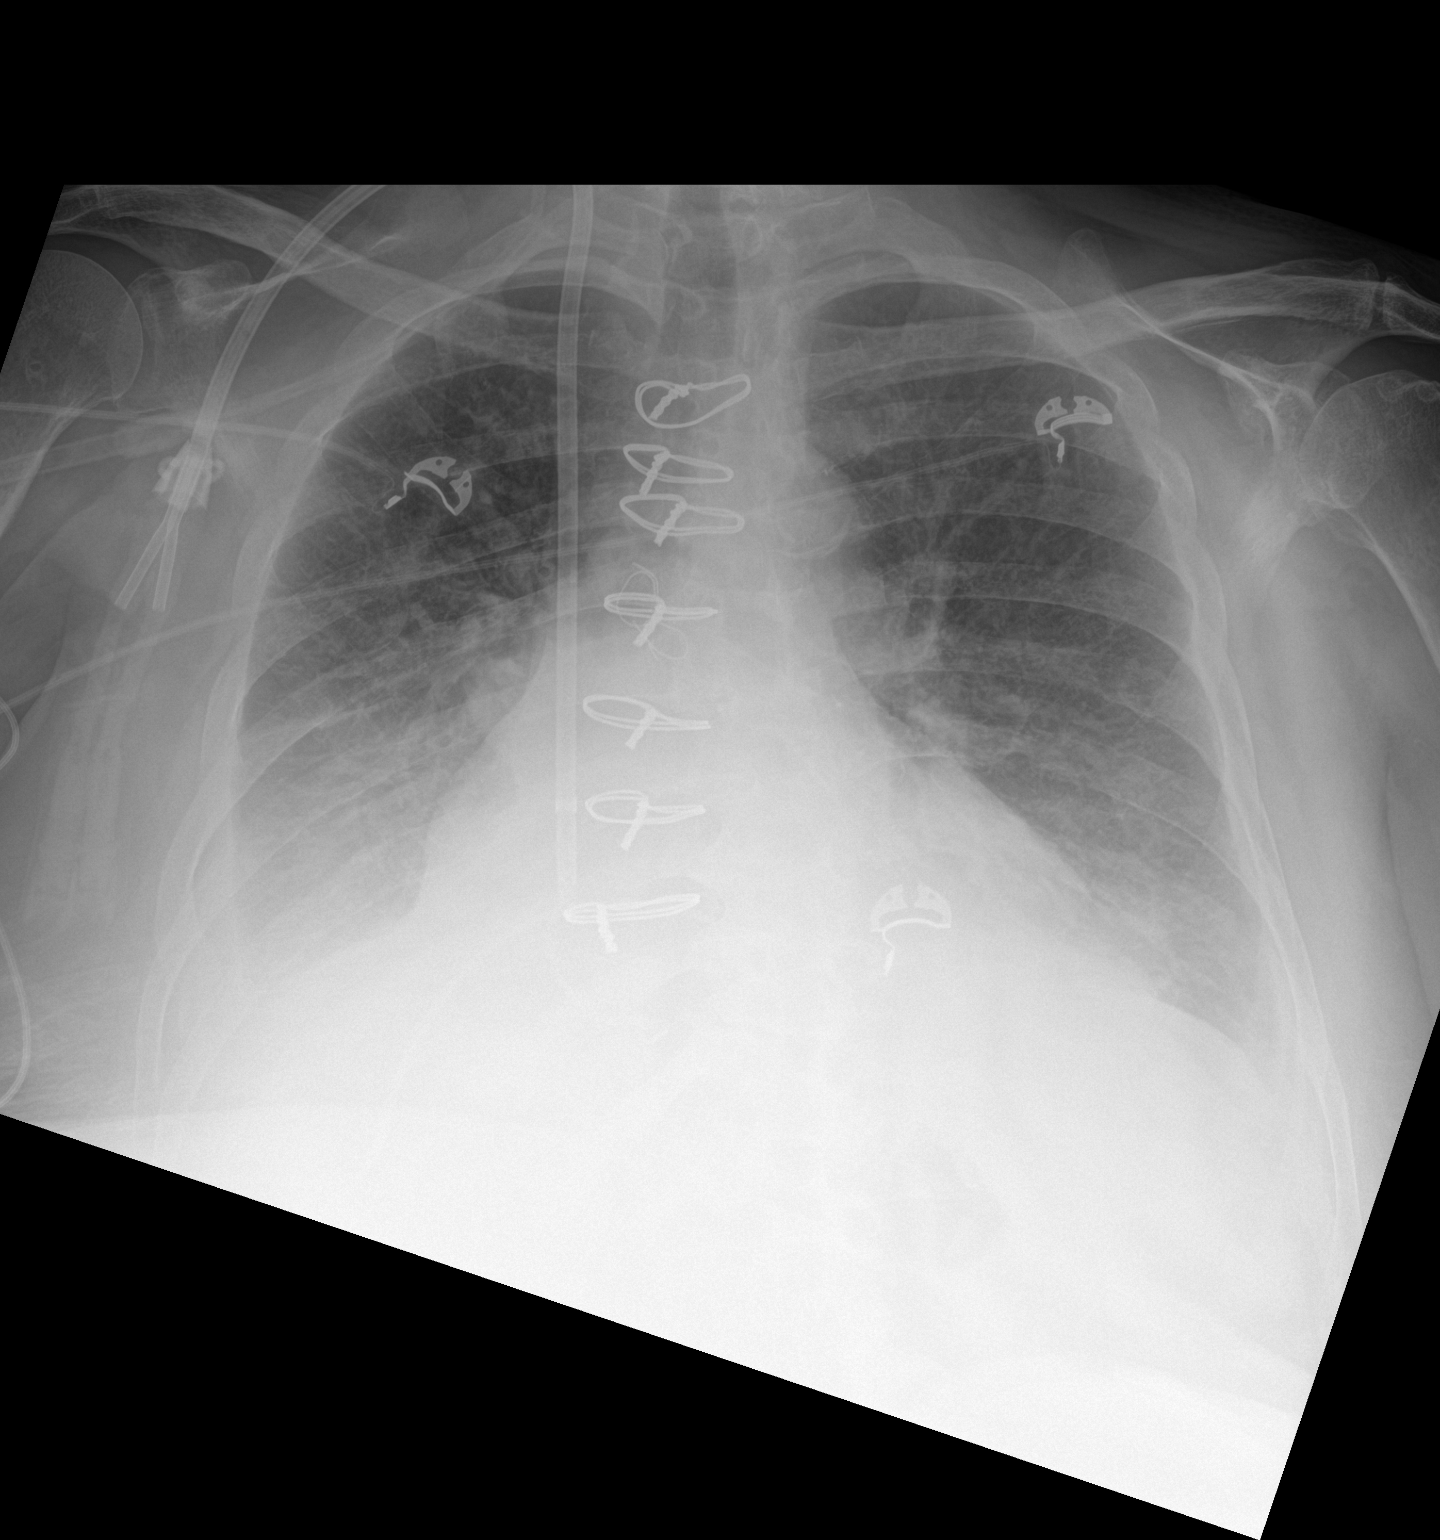

[2 of 2 positions shown; findings below may reference images not displayed]

FINDINGS: The lungs again are not optimally aerated and there are persistent
changes of CHF with pulmonary vascular congestion, cardiomegaly, and
small pleural effusions right greater than left. Right dialysis
catheter tip overlies the region of the right atrium and
cardiomegaly is stable. Median sternotomy sutures are unchanged.
IMPRESSION: 1. Little change in CHF pattern with bilateral pleural effusions.
2. Suboptimal aeration.

## 2018-10-17 ENCOUNTER — Ambulatory Visit (HOSPITAL_COMMUNITY): Payer: Medicare (Managed Care)

## 2018-10-17 ENCOUNTER — Ambulatory Visit: Payer: Medicare (Managed Care) | Admitting: Vascular Surgery

## 2018-11-21 ENCOUNTER — Ambulatory Visit
Admission: RE | Admit: 2018-11-21 | Discharge: 2018-11-21 | Disposition: A | Discharge status: Home / Self Care | Payer: Medicare (Managed Care) | Source: Ambulatory Visit | Attending: Family Medicine | Admitting: Family Medicine

## 2018-11-21 ENCOUNTER — Encounter (HOSPITAL_COMMUNITY): Payer: Medicare (Managed Care)

## 2018-11-21 ENCOUNTER — Other Ambulatory Visit: Payer: Self-pay | Admitting: Family Medicine

## 2018-11-21 ENCOUNTER — Ambulatory Visit: Payer: Medicare (Managed Care) | Admitting: Vascular Surgery

## 2018-11-21 DIAGNOSIS — Z79899 Other long term (current) drug therapy: Secondary | ICD-10-CM

## 2019-01-16 ENCOUNTER — Other Ambulatory Visit (HOSPITAL_COMMUNITY): Payer: Self-pay | Admitting: *Deleted

## 2019-01-17 ENCOUNTER — Other Ambulatory Visit: Payer: Self-pay

## 2019-01-17 ENCOUNTER — Encounter (HOSPITAL_COMMUNITY)
Admission: RE | Admit: 2019-01-17 | Discharge: 2019-01-17 | Disposition: A | Discharge status: Home / Self Care | Payer: Medicare (Managed Care) | Source: Ambulatory Visit | Attending: Internal Medicine | Admitting: Internal Medicine

## 2019-01-17 DIAGNOSIS — D509 Iron deficiency anemia, unspecified: Secondary | ICD-10-CM | POA: Insufficient documentation

## 2019-01-17 MED ORDER — SODIUM CHLORIDE 0.9 % IV SOLN
510.0000 mg | INTRAVENOUS | Status: DC
  Administered 2019-01-17: 510 mg via INTRAVENOUS
  Filled 2019-01-17: qty 510

## 2019-01-23 ENCOUNTER — Other Ambulatory Visit (HOSPITAL_COMMUNITY): Payer: Self-pay | Admitting: *Deleted

## 2019-01-24 ENCOUNTER — Encounter (HOSPITAL_COMMUNITY)
Admission: RE | Admit: 2019-01-24 | Discharge: 2019-01-24 | Disposition: A | Discharge status: Home / Self Care | Payer: Medicare (Managed Care) | Source: Ambulatory Visit | Attending: Internal Medicine | Admitting: Internal Medicine

## 2019-01-24 DIAGNOSIS — D509 Iron deficiency anemia, unspecified: Secondary | ICD-10-CM | POA: Diagnosis not present

## 2019-01-24 MED ORDER — SODIUM CHLORIDE 0.9 % IV SOLN
510.0000 mg | INTRAVENOUS | Status: DC
  Administered 2019-01-24: 510 mg via INTRAVENOUS
  Filled 2019-01-24: qty 510

## 2019-03-12 ENCOUNTER — Telehealth (HOSPITAL_COMMUNITY): Payer: Self-pay

## 2019-03-12 NOTE — Telephone Encounter (Signed)

## 2019-03-13 ENCOUNTER — Other Ambulatory Visit: Payer: Self-pay

## 2019-03-13 ENCOUNTER — Encounter: Payer: Self-pay | Admitting: Vascular Surgery

## 2019-03-13 ENCOUNTER — Ambulatory Visit (HOSPITAL_COMMUNITY)
Admission: RE | Admit: 2019-03-13 | Discharge: 2019-03-13 | Disposition: A | Discharge status: Home / Self Care | Payer: Medicare (Managed Care) | Source: Ambulatory Visit | Attending: Vascular Surgery | Admitting: Vascular Surgery

## 2019-03-13 ENCOUNTER — Ambulatory Visit (INDEPENDENT_AMBULATORY_CARE_PROVIDER_SITE_OTHER): Payer: Medicare (Managed Care) | Admitting: Vascular Surgery

## 2019-03-13 VITALS — BP 140/64 | HR 59 | Temp 97.6°F | Resp 20 | Ht 60.0 in | Wt 194.0 lb

## 2019-03-13 DIAGNOSIS — I6523 Occlusion and stenosis of bilateral carotid arteries: Secondary | ICD-10-CM

## 2019-03-13 NOTE — Progress Notes (Signed)
Patient name: [name] MRN: [id number] DOB: 1956-06-07 Sex: male  REASON FOR VISIT:   Follow-up of carotid disease  HPI:   [name] POSS is a pleasant 63 y.o. male who I have been following with bilateral carotid disease.  I last saw him on 12/20/2017.  This patient is undergone a left carotid endarterectomy with CABG in January 2017.  When I saw him last he had a recurrent 60 to 70% stenosis on the left at the distal end of the patch.  On the right side he had a 60 to 79% stenosis.  Since I saw him last, he denies any focal weakness or paresthesias.  He denies expressive or receptive aphasia or amaurosis fugax.  He is on a statin.  He also is on Eliquis.  He is not taking aspirin.  When I saw him previously I told him he really needed to be on the aspirin and he continues not to take this.  He tells me today that he will make an effort to get some aspirin.  We have discussed how important this is.  There is been no other significant changes in his medical history.  Past Medical History:  Diagnosis Date  . Abdominal hernia    Chronic, not a good surgical candidate  . Abscess, abdomen 12/31/2010   Referred to Wound Care in 01/2011 because of multiple abd abscess with VERY large ventral hernia (please look at image of CT abd/pelvis 09/2010).  Because of hernia I was hesitant to I&D.      Marland Kitchen Anemia    History of Iron Def Anemia  . Anxiety   . AVM (arteriovenous malformation)    chronic GI blood loss  . Carotid artery disease ([place])    s/p CEA  . Chronic diastolic CHF (congestive heart failure) (HCC)    takes Lasix  . Chronic low back pain   . COPD (chronic obstructive pulmonary disease) ([place])   . CVA (cerebral infarction) 09/2010   Bilateral with Left > Right  . Diabetes mellitus   . GERD (gastroesophageal reflux disease)   . History of nuclear stress test    Myoview 9/16:  Inferior, apical and inf-lateral ischemia; not gated; High Risk  . Hx of echocardiogram    Echo  5/16:  Mild LVH, EF 55%, indeterm. diast function, WMA could not be ruled out, MAC, trivial MR, mild LAE, normal RVF //  b. Echo 4/17: EF 55-60%, no RWMA, Gr 2 DD, Ao sclerosis, MAC, mild MS, mild LAE, PASP 55 mHg  . Hyperlipidemia   . Hypertension   . Intellectual disability    Sister helps to take care of him and takes him to appts  . Itching    all over body; pt scratches and has sores on bilateral arms and abdomen  . Lung nodule   . Myocardial infarction [facility]) 2016 ?   Heart attack  (  Per  pt. )  . Obesity   . Oxygen dependent    wears 2 liters at bedtime and when needed  . PAF (paroxysmal atrial fibrillation) ([place]) 06/2009   CHADS score 2 (HTN, DM) // Pradaxa for Afib // Pradaxa stopped due to worsening anemia (Hgb in 5/18 8.5 >> Pradaxa remains on hold)   . Pneumonia    hx of  . Stroke ([place])   . Tobacco user    Smokes 1ppd for multiple years.  Quit after hosp 09/2010.  . Tubular adenoma of colon   . Wears glasses  Family History  Problem Relation Age of Onset  . Heart disease Mother   . Hypertension Sister   . Heart disease Brother   . Heart disease Father   . Peripheral vascular disease Father   . Heart disease Maternal Grandmother   . Heart attack Maternal Grandmother   . Breast cancer Maternal Grandmother   . Stomach cancer Maternal Uncle   . Colon cancer Neg Hx   . Stroke Neg Hx     SOCIAL HISTORY: Social History   Tobacco Use  . Smoking status: Former Smoker    Packs/day: 1.00    Years: 42.00    Pack years: 42.00    Types: Cigarettes, Pipe    Quit date: 11/10/2010    Years since quitting: 8.3  . Smokeless tobacco: Former Systems developer    Quit date: 10/02/2010  Substance Use Topics  . Alcohol use: No    Alcohol/week: 0.0 standard drinks    Comment: " last drink was 2003" 08/11/15    Allergies  Allergen Reactions  . Penicillins Hives, Nausea And Vomiting, Swelling and Other (See Comments)    Tolerated Cefepime Has patient had a PCN reaction causing  immediate rash, facial/tongue/throat swelling, SOB or lightheadedness with hypotension: YES Has patient had a PCN reaction causing severe rash involving mucus membranes or skin necrosis: No Has patient had a PCN reaction that required hospitalization No Has patient had a PCN reaction occurring within the last 10 years: No If all of the above answers are "NO", then may proceed with Cephalosporin use.     Current Outpatient Medications  Medication Sig Dispense Refill  . acetaminophen (TYLENOL) 325 MG tablet Take 650 mg by mouth 3 (three) times daily as needed for mild pain.    Marland Kitchen amiodarone (PACERONE) 200 MG tablet Take 1 tablet (200 mg total) by mouth daily. 90 tablet 3  . apixaban (ELIQUIS) 5 MG TABS tablet Take 5 mg by mouth 2 (two) times daily.    Marland Kitchen atorvastatin (LIPITOR) 80 MG tablet Take 1 tablet (80 mg total) by mouth daily. 30 tablet 0  . calcium carbonate (TUMS - DOSED IN MG ELEMENTAL CALCIUM) 500 MG chewable tablet Chew 1 tablet by mouth 3 (three) times daily as needed for indigestion or heartburn.    . Cholecalciferol (VITAMIN D3) 1000 UNITS CAPS Take 1,000 Units by mouth daily.    . Darbepoetin Alfa (ARANESP) 150 MCG/0.3ML SOSY injection Inject 0.3 mLs (150 mcg total) into the skin every Wednesday at 6 PM. 1.68 mL 0  . insulin glargine (LANTUS) 100 UNIT/ML injection Inject 0.14 mLs (14 Units total) into the skin at bedtime. 10 mL 11  . ipratropium-albuterol (DUONEB) 0.5-2.5 (3) MG/3ML SOLN Take 3 mLs by nebulization 2 (two) times daily.    . Multiple Vitamin (MULTIVITAMIN) capsule Take 1 capsule by mouth daily.    . multivitamin (RENA-VIT) TABS tablet Take 1 tablet by mouth at bedtime. 30 tablet 0  . nitroGLYCERIN (NITROSTAT) 0.4 MG SL tablet Place 0.4 mg under the tongue every 5 (five) minutes as needed for chest pain.    Marland Kitchen nystatin (MYCOSTATIN/NYSTOP) powder Apply topically 4 (four) times daily. Apply to inguinal area/scrotum and under pannus 15 g 0  . pantoprazole (PROTONIX) 40 MG  tablet Take 40 mg by mouth daily.    Marland Kitchen PARoxetine (PAXIL) 40 MG tablet Take 40 mg by mouth daily.     . polyvinyl alcohol (LIQUIFILM TEARS) 1.4 % ophthalmic solution Place 1 drop into both eyes 2 (two) times daily as  needed for dry eyes. Natural Tears    . risperiDONE (RISPERDAL) 0.5 MG tablet Take 0.5 mg by mouth 2 (two) times daily.    Marland Kitchen senna-docusate (SENOKOT-S) 8.6-50 MG tablet Take 1 tablet by mouth at bedtime as needed for mild constipation. 30 tablet 0  . Skin Protectants, Misc. (EUCERIN) cream Apply 1 application topically 2 (two) times daily. TO AFFECTED AREAS    . traMADol (ULTRAM) 50 MG tablet Take 1-2 tablets (50-100 mg total) by mouth every 6 (six) hours as needed for moderate pain. 15 tablet 0  . furosemide (LASIX) 80 MG tablet Take 1 tablet (80 mg total) by mouth 2 (two) times daily as needed for fluid. 60 tablet 0   No current facility-administered medications for this visit.     REVIEW OF SYSTEMS:  _0  denotes positive finding, _1  denotes negative finding Cardiac  Comments:  Chest pain or chest pressure:    Shortness of breath upon exertion:    Short of breath when lying flat:    Irregular heart rhythm:        Vascular    Pain in calf, thigh, or hip brought on by ambulation:    Pain in feet at night that wakes you up from your sleep:     Blood clot in your veins:    Leg swelling:         Pulmonary    Oxygen at home:    Productive cough:     Wheezing:         Neurologic    Sudden weakness in arms or legs:     Sudden numbness in arms or legs:     Sudden onset of difficulty speaking or slurred speech:    Temporary loss of vision in one eye:     Problems with dizziness:         Gastrointestinal    Blood in stool:     Vomited blood:         Genitourinary    Burning when urinating:     Blood in urine:        Psychiatric    Major depression:         Hematologic    Bleeding problems:    Problems with blood clotting too easily:        Skin    Rashes or  ulcers:        Constitutional    Fever or chills:     PHYSICAL EXAM:   Vitals:   03/13/19 1354 03/13/19 1357  BP: (!) 145/68 140/64  Pulse: (!) 59   Resp: 20   Temp: 97.6 F (36.4 C)   Weight: 194 lb (88 kg)   Height: 5' (1.524 m)     GENERAL: The patient is a well-nourished male, in no acute distress. The vital signs are documented above. CARDIAC: There is a regular rate and rhythm.  VASCULAR: I do not detect carotid bruits. Both feet are warm and well-perfused. PULMONARY: There is good air exchange bilaterally without wheezing or rales. ABDOMEN: Soft and non-tender with normal pitched bowel sounds.  MUSCULOSKELETAL: There are no major deformities or cyanosis. NEUROLOGIC: No focal weakness or paresthesias are detected. SKIN: There are no ulcers or rashes noted. PSYCHIATRIC: The patient has a normal affect.  DATA:    CAROTID DUPLEX: I have independently interpreted his carotid duplex scan today.  On the right side he has a 60 to 79% carotid stenosis.  On the left side he has a 40  to 59% carotid stenosis.  Both vertebral arteries are patent with antegrade flow.  MEDICAL ISSUES:   BILATERAL CAROTID DISEASE: This patient has a recurrent 60 to 70% left carotid stenosis and also a 60 to 70% right carotid stenosis.  This is asymptomatic.  He is on a statin.  He has not been willing to take aspirin but I think is agreeable now to begin taking aspirin.  He is also on Eliquis.  I have instructed him to just take baby aspirin.  He understands this will help lower his risk of stroke.  He understands we would only consider carotid endarterectomy if the stenosis progressed to greater than 80% or he develop new neurologic symptoms.  I have encouraged him to stay as active as possible.  We also discussed the importance of nutrition.  I ordered a follow-up carotid duplex scan in 6 months and I will see him back at that time.  He knows to call sooner if he has problems.  [name]  [facility] [phone]

## 2019-04-03 ENCOUNTER — Other Ambulatory Visit (HOSPITAL_COMMUNITY): Payer: Self-pay | Admitting: *Deleted

## 2019-04-04 ENCOUNTER — Ambulatory Visit (HOSPITAL_COMMUNITY)
Admission: RE | Admit: 2019-04-04 | Discharge: 2019-04-04 | Disposition: A | Discharge status: Home / Self Care | Payer: Medicare (Managed Care) | Source: Ambulatory Visit | Attending: Nurse Practitioner | Admitting: Nurse Practitioner

## 2019-04-04 ENCOUNTER — Other Ambulatory Visit: Payer: Self-pay

## 2019-04-04 DIAGNOSIS — D509 Iron deficiency anemia, unspecified: Secondary | ICD-10-CM | POA: Diagnosis not present

## 2019-04-04 MED ORDER — FERUMOXYTOL INJECTION 510 MG/17 ML
510.0000 mg | INTRAVENOUS | Status: DC
  Administered 2019-04-04: 12:00:00 510 mg via INTRAVENOUS
  Filled 2019-04-04: qty 17

## 2019-04-16 ENCOUNTER — Ambulatory Visit
Admission: RE | Admit: 2019-04-16 | Discharge: 2019-04-16 | Disposition: A | Discharge status: Home / Self Care | Payer: Medicare (Managed Care) | Source: Ambulatory Visit | Attending: Family Medicine | Admitting: Family Medicine

## 2019-04-16 ENCOUNTER — Other Ambulatory Visit: Payer: Self-pay | Admitting: Family Medicine

## 2019-04-16 DIAGNOSIS — R05 Cough: Secondary | ICD-10-CM

## 2019-04-16 DIAGNOSIS — R059 Cough, unspecified: Secondary | ICD-10-CM

## 2019-04-16 DIAGNOSIS — J449 Chronic obstructive pulmonary disease, unspecified: Secondary | ICD-10-CM

## 2019-04-25 ENCOUNTER — Encounter (HOSPITAL_COMMUNITY)
Admission: RE | Admit: 2019-04-25 | Discharge: 2019-04-25 | Disposition: A | Discharge status: Home / Self Care | Payer: Medicare (Managed Care) | Source: Ambulatory Visit | Attending: Family Medicine | Admitting: Family Medicine

## 2019-04-25 ENCOUNTER — Other Ambulatory Visit: Payer: Self-pay

## 2019-04-25 DIAGNOSIS — D509 Iron deficiency anemia, unspecified: Secondary | ICD-10-CM | POA: Diagnosis present

## 2019-04-25 MED ORDER — SODIUM CHLORIDE 0.9 % IV SOLN
510.0000 mg | INTRAVENOUS | Status: DC
  Administered 2019-04-25: 510 mg via INTRAVENOUS
  Filled 2019-04-25: qty 510

## 2019-05-16 ENCOUNTER — Inpatient Hospital Stay (HOSPITAL_COMMUNITY)
Admission: RE | Admit: 2019-05-16 | Discharge: 2019-05-16 | Disposition: A | Discharge status: Home / Self Care | Payer: Medicare (Managed Care) | Source: Ambulatory Visit | Attending: Nurse Practitioner | Admitting: Nurse Practitioner

## 2019-05-24 ENCOUNTER — Ambulatory Visit (HOSPITAL_COMMUNITY)
Admission: RE | Admit: 2019-05-24 | Discharge: 2019-05-24 | Disposition: A | Discharge status: Home / Self Care | Payer: Medicare (Managed Care) | Source: Ambulatory Visit | Attending: Nurse Practitioner | Admitting: Nurse Practitioner

## 2019-05-24 ENCOUNTER — Other Ambulatory Visit: Payer: Self-pay

## 2019-05-24 DIAGNOSIS — D509 Iron deficiency anemia, unspecified: Secondary | ICD-10-CM | POA: Diagnosis not present

## 2019-05-24 MED ORDER — SODIUM CHLORIDE 0.9 % IV SOLN
510.0000 mg | INTRAVENOUS | Status: DC
  Administered 2019-05-24: 09:00:00 510 mg via INTRAVENOUS
  Filled 2019-05-24: qty 510

## 2019-05-31 ENCOUNTER — Encounter (HOSPITAL_COMMUNITY): Payer: Medicare (Managed Care)

## 2019-06-06 ENCOUNTER — Encounter (HOSPITAL_COMMUNITY): Payer: Medicare (Managed Care)

## 2019-06-11 ENCOUNTER — Ambulatory Visit (INDEPENDENT_AMBULATORY_CARE_PROVIDER_SITE_OTHER): Payer: Medicare (Managed Care) | Admitting: Internal Medicine

## 2019-06-11 ENCOUNTER — Encounter: Payer: Self-pay | Admitting: Internal Medicine

## 2019-06-11 ENCOUNTER — Other Ambulatory Visit: Payer: Self-pay

## 2019-06-11 VITALS — BP 114/60 | HR 61 | Ht 60.0 in | Wt 190.0 lb

## 2019-06-11 DIAGNOSIS — I5042 Chronic combined systolic (congestive) and diastolic (congestive) heart failure: Secondary | ICD-10-CM

## 2019-06-11 DIAGNOSIS — I10 Essential (primary) hypertension: Secondary | ICD-10-CM

## 2019-06-11 DIAGNOSIS — I480 Paroxysmal atrial fibrillation: Secondary | ICD-10-CM

## 2019-06-11 MED ORDER — AMIODARONE HCL 200 MG PO TABS: ORAL_TABLET | ORAL | 3 refills | Status: DC

## 2019-06-11 NOTE — Patient Instructions (Addendum)
Medication Instructions:  Your physician has recommended you make the following change in your medication:   1.  Reduce your amiodarone 200 mg-  Take one tablet by mouth daily Monday through Saturday.  Do NOT take on Sunday.  Labwork: None ordered.  Testing/Procedures: None ordered.  Follow-Up: Your physician wants you to follow-up in: one year with Dr. Taylor.   You will receive a reminder letter in the mail two months in advance. If you don't receive a letter, please call our office to schedule the follow-up appointment.   Any Other Special Instructions Will Be Listed Below (If Applicable).  If you need a refill on your cardiac medications before your next appointment, please call your pharmacy.   

## 2019-06-11 NOTE — Progress Notes (Signed)
HPI [name] returns today for followup. He is a pleasant 63 yo man with multiple medical problems including PAF, diastolic heart failure and HTN. He is also obese. He has not had any symptomatic pauses or palpitations in the interim. He has had to have his legs wrapped. He admits to sodium indiscretion. Allergies  Allergen Reactions  . Penicillins Hives, Nausea And Vomiting, Swelling and Other (See Comments)    Tolerated Cefepime Has patient had a PCN reaction causing immediate rash, facial/tongue/throat swelling, SOB or lightheadedness with hypotension: YES Has patient had a PCN reaction causing severe rash involving mucus membranes or skin necrosis: No Has patient had a PCN reaction that required hospitalization No Has patient had a PCN reaction occurring within the last 10 years: No If all of the above answers are "NO", then may proceed with Cephalosporin use.      Current Outpatient Medications  Medication Sig Dispense Refill  . acetaminophen (TYLENOL) 325 MG tablet Take 650 mg by mouth 3 (three) times daily as needed for mild pain.    Marland Kitchen amiodarone (PACERONE) 200 MG tablet Take 1 tablet (200 mg total) by mouth daily. 90 tablet 3  . apixaban (ELIQUIS) 5 MG TABS tablet Take 5 mg by mouth 2 (two) times daily.    Marland Kitchen atorvastatin (LIPITOR) 80 MG tablet Take 1 tablet (80 mg total) by mouth daily. 30 tablet 0  . calcium carbonate (TUMS - DOSED IN MG ELEMENTAL CALCIUM) 500 MG chewable tablet Chew 1 tablet by mouth 3 (three) times daily as needed for indigestion or heartburn.    . Cholecalciferol (VITAMIN D3) 1000 UNITS CAPS Take 1,000 Units by mouth daily.    . Darbepoetin Alfa (ARANESP) 150 MCG/0.3ML SOSY injection Inject 0.3 mLs (150 mcg total) into the skin every Wednesday at 6 PM. 1.68 mL 0  . insulin glargine (LANTUS) 100 UNIT/ML injection Inject 0.14 mLs (14 Units total) into the skin at bedtime. 10 mL 11  . ipratropium-albuterol (DUONEB) 0.5-2.5 (3) MG/3ML SOLN Take 3 mLs by  nebulization 2 (two) times daily.    . Multiple Vitamin (MULTIVITAMIN) capsule Take 1 capsule by mouth daily.    . multivitamin (RENA-VIT) TABS tablet Take 1 tablet by mouth at bedtime. 30 tablet 0  . nitroGLYCERIN (NITROSTAT) 0.4 MG SL tablet Place 0.4 mg under the tongue every 5 (five) minutes as needed for chest pain.    Marland Kitchen nystatin (MYCOSTATIN/NYSTOP) powder Apply topically 4 (four) times daily. Apply to inguinal area/scrotum and under pannus 15 g 0  . pantoprazole (PROTONIX) 40 MG tablet Take 40 mg by mouth daily.    Marland Kitchen PARoxetine (PAXIL) 40 MG tablet Take 40 mg by mouth daily.     . polyvinyl alcohol (LIQUIFILM TEARS) 1.4 % ophthalmic solution Place 1 drop into both eyes 2 (two) times daily as needed for dry eyes. Natural Tears    . risperiDONE (RISPERDAL) 0.5 MG tablet Take 0.5 mg by mouth 2 (two) times daily.    Marland Kitchen senna-docusate (SENOKOT-S) 8.6-50 MG tablet Take 1 tablet by mouth at bedtime as needed for mild constipation. 30 tablet 0  . Skin Protectants, Misc. (EUCERIN) cream Apply 1 application topically 2 (two) times daily. TO AFFECTED AREAS    . traMADol (ULTRAM) 50 MG tablet Take 1-2 tablets (50-100 mg total) by mouth every 6 (six) hours as needed for moderate pain. 15 tablet 0  . furosemide (LASIX) 80 MG tablet Take 1 tablet (80 mg total) by mouth 2 (two) times daily  as needed for fluid. 60 tablet 0   No current facility-administered medications for this visit.      Past Medical History:  Diagnosis Date  . Abdominal hernia    Chronic, not a good surgical candidate  . Abscess, abdomen 12/31/2010   Referred to Wound Care in 01/2011 because of multiple abd abscess with VERY large ventral hernia (please look at image of CT abd/pelvis 09/2010).  Because of hernia I was hesitant to I&D.      Marland Kitchen Anemia    History of Iron Def Anemia  . Anxiety   . AVM (arteriovenous malformation)    chronic GI blood loss  . Carotid artery disease ([place])    s/p CEA  . Chronic diastolic CHF (congestive  heart failure) (HCC)    takes Lasix  . Chronic low back pain   . COPD (chronic obstructive pulmonary disease) ([place])   . CVA (cerebral infarction) 09/2010   Bilateral with Left > Right  . Diabetes mellitus   . GERD (gastroesophageal reflux disease)   . History of nuclear stress test    Myoview 9/16:  Inferior, apical and inf-lateral ischemia; not gated; High Risk  . Hx of echocardiogram    Echo 5/16:  Mild LVH, EF 55%, indeterm. diast function, WMA could not be ruled out, MAC, trivial MR, mild LAE, normal RVF //  b. Echo 4/17: EF 55-60%, no RWMA, Gr 2 DD, Ao sclerosis, MAC, mild MS, mild LAE, PASP 55 mHg  . Hyperlipidemia   . Hypertension   . Intellectual disability    Sister helps to take care of him and takes him to appts  . Itching    all over body; pt scratches and has sores on bilateral arms and abdomen  . Lung nodule   . Myocardial infarction [facility]) 2016 ?   Heart attack  (  Per  pt. )  . Obesity   . Oxygen dependent    wears 2 liters at bedtime and when needed  . PAF (paroxysmal atrial fibrillation) ([place]) 06/2009   CHADS score 2 (HTN, DM) // Pradaxa for Afib // Pradaxa stopped due to worsening anemia (Hgb in 5/18 8.5 >> Pradaxa remains on hold)   . Pneumonia    hx of  . Stroke ([place])   . Tobacco user    Smokes 1ppd for multiple years.  Quit after hosp 09/2010.  . Tubular adenoma of colon   . Wears glasses     ROS:   All systems reviewed and negative except as noted in the HPI.   Past Surgical History:  Procedure Laterality Date  . AMPUTATION Right 03/29/2013   Procedure: AMPUTATION RAY;  Surgeon: Newt Minion, MD;  Location: [place];  Service: Orthopedics;  Laterality: Right;  Right Foot 3rd and Possible 4th Ray Amputation  . AMPUTATION Right 04/23/2013   Procedure: AMPUTATION RIGHT MID-FOOT;  Surgeon: Newt Minion, MD;  Location: [place];  Service: Orthopedics;  Laterality: Right;  . arm surgery Left    as a child  . CARDIAC CATHETERIZATION N/A 04/30/2015   Procedure:  Left Heart Cath and Coronary Angiography;  Surgeon: Belva Crome, MD;  Location: [place] CV [facility];  Service: Cardiovascular;  Laterality: N/A;  . Carotid arteriogram  10/2010   30% right ICA stenosis, 40% left ICA stenosis   . CAROTID ENDARTERECTOMY Left 08-13-15   CEA  . CATARACT EXTRACTION, BILATERAL    . COLONOSCOPY WITH PROPOFOL N/A 06/10/2014   Procedure: COLONOSCOPY WITH PROPOFOL;  Surgeon:  Jerene Bears, MD;  Location: [facility];  Service: Gastroenterology;  Laterality: N/A;  . CORONARY ARTERY BYPASS GRAFT N/A 08/13/2015   Procedure: CORONARY ARTERY BYPASS GRAFTING (CABG), ON PUMP, TIMES THREE, USING LEFT INTERNAL MAMMARY ARTERY, RIGHT GREATER SAPHENOUS VEIN HARVESTED ENDOSCOPICALLY;  Surgeon: [name], MD;  Location: [place];  Service: Open Heart Surgery;  Laterality: N/A;  . DEBRIDMENT OF DECUBITUS ULCER Right 02/13/2013  . ENDARTERECTOMY Left 08/13/2015   Procedure: ENDARTERECTOMY CAROTID;  Surgeon: Angelia Mould, MD;  Location: [place];  Service: Vascular;  Laterality: Left;  . ESOPHAGOGASTRODUODENOSCOPY (EGD) WITH PROPOFOL N/A 06/10/2014   Procedure: ESOPHAGOGASTRODUODENOSCOPY (EGD) WITH PROPOFOL;  Surgeon: Jerene Bears, MD;  Location: [facility];  Service: Gastroenterology;  Laterality: N/A;  . GIVENS CAPSULE STUDY N/A 07/09/2014   Procedure: GIVENS CAPSULE STUDY;  Surgeon: Jerene Bears, MD;  Location: [facility];  Service: Gastroenterology;  Laterality: N/A;  . I&D EXTREMITY Right 02/13/2013   Procedure: IRRIGATION AND DEBRIDEMENT FOOT ULCER;  Surgeon: Johnny Bridge, MD;  Location: [place];  Service: Orthopedics;  Laterality: Right;  PULSE LAVAGE  . INSERTION OF DIALYSIS CATHETER N/A 10/02/2017   Procedure: INSERTION OF TUNNELED DIALYSIS CATHETER;  Surgeon: Elam Dutch, MD;  [facility];  Service: Vascular;  Laterality: N/A;  . IR REMOVAL TUN CV CATH W/O FL  10/25/2017  . MULTIPLE TOOTH EXTRACTIONS    . TEE WITHOUT CARDIOVERSION N/A 08/13/2015   Procedure:  TRANSESOPHAGEAL ECHOCARDIOGRAM (TEE);  Surgeon: [name], MD;  Location: [place];  Service: Open Heart Surgery;  Laterality: N/A;  . TEMPORARY PACEMAKER N/A 09/15/2017   Procedure: TEMPORARY PACEMAKER;  Surgeon: Jettie Booze, MD;  Location: [place] [facility];  Service: Cardiovascular;  Laterality: N/A;  . TRANSESOPHAGEAL ECHOCARDIOGRAM  09/2010   No ASD or PFO. EF 60-65%.  Normal systolic function. No evidence of thrombus.   . TRANSTHORACIC ECHOCARDIOGRAM  09/2010    The cavity size was normal. Systolic function was vigorous.  EF 65-70%.  Normal wall funciton.   Marland Kitchen ULTRASOUND GUIDANCE FOR VASCULAR ACCESS  09/15/2017   Procedure: Ultrasound Guidance For Vascular Access;  Surgeon: Jettie Booze, MD;  Location: [place] [facility];  Service: Cardiovascular;;     Family History  Problem Relation Age of Onset  . Heart disease Mother   . Hypertension Sister   . Heart disease Brother   . Heart disease Father   . Peripheral vascular disease Father   . Heart disease Maternal Grandmother   . Heart attack Maternal Grandmother   . Breast cancer Maternal Grandmother   . Stomach cancer Maternal Uncle   . Colon cancer Neg Hx   . Stroke Neg Hx      Social History   Socioeconomic History  . Marital status: Single    Spouse name: Not on file  . Number of children: 0  . Years of education: Not on file  . Highest education level: Not on file  Occupational History    Employer: DISABLED  Social Needs  . Financial resource strain: Not on file  . Food insecurity    Worry: Not on file    Inability: Not on file  . Transportation needs    Medical: Not on file    Non-medical: Not on file  Tobacco Use  . Smoking status: Former Smoker    Packs/day: 1.00    Years: 42.00    Pack years: 42.00    Types: Cigarettes, Pipe    Quit  date: 11/10/2010    Years since quitting: 8.5  . Smokeless tobacco: Former Systems developer    Quit date: 10/02/2010  Substance and Sexual Activity  . Alcohol use: No     Alcohol/week: 0.0 standard drinks    Comment: " last drink was 2003" 08/11/15  . Drug use: No  . Sexual activity: Not on file  Lifestyle  . Physical activity    Days per week: Not on file    Minutes per session: Not on file  . Stress: Not on file  Relationships  . Social Herbalist on phone: Not on file    Gets together: Not on file    Attends religious service: Not on file    Active member of club or organization: Not on file    Attends meetings of clubs or organizations: Not on file    Relationship status: Not on file  . Intimate partner violence    Fear of current or ex partner: Not on file    Emotionally abused: Not on file    Physically abused: Not on file    Forced sexual activity: Not on file  Other Topics Concern  . Not on file  Social History Narrative   Released from prison in 2003 after serving 2 years for indecency with a minor.    He smokes cigarettes greater than 30 years, also smoked a pipe.  Quit tobacco 09/2010.   Denies EtOH and drugs.      BP 114/60   Pulse 61   Ht 5' (1.524 m)   Wt 190 lb (86.2 kg)   SpO2 96%   BMI 37.11 kg/m   Physical Exam:  Chronically ill appearing 63 yo man, NAD HEENT: Unremarkable Neck:  6 cm JVD, no thyromegally Lymphatics:  No adenopathy Back:  No CVA tenderness Lungs:  Clear with no wheezes HEART:  Regular rate rhythm, no murmurs, no rubs, no clicks Abd:  soft, positive bowel sounds, no organomegally, no rebound, no guarding Ext:  2 plus pulses, no edema, no cyanosis, no clubbing Skin:  No rashes no nodules Neuro:  CN II through XII intact, motor grossly intact  EKG - nsr with RBBB  Assess/Plan: 1. PAF - he is maintaining NSR very nicely.  2. Sinus node dysfunction - he has not had any symptomatic post termination pauses 3. Diastolic heart failure - he appears euvolemic but does admit to sodium indiscretion. I asked him to stop eating salty foods. 4. Obesity - his bmi is 76. I asked him to reduce his  calorie consumption.  [name].D.

## 2019-06-14 ENCOUNTER — Encounter (HOSPITAL_COMMUNITY)
Admission: RE | Admit: 2019-06-14 | Discharge: 2019-06-14 | Disposition: A | Discharge status: Home / Self Care | Payer: Medicare (Managed Care) | Source: Ambulatory Visit | Attending: Family Medicine | Admitting: Family Medicine

## 2019-06-14 DIAGNOSIS — D509 Iron deficiency anemia, unspecified: Secondary | ICD-10-CM | POA: Diagnosis not present

## 2019-06-14 MED ORDER — SODIUM CHLORIDE 0.9 % IV SOLN
510.0000 mg | INTRAVENOUS | Status: DC
  Administered 2019-06-14: 510 mg via INTRAVENOUS
  Filled 2019-06-14: qty 17

## 2019-07-25 ENCOUNTER — Encounter: Payer: Self-pay | Admitting: Internal Medicine

## 2019-09-12 ENCOUNTER — Ambulatory Visit
Admission: RE | Admit: 2019-09-12 | Discharge: 2019-09-12 | Disposition: A | Discharge status: Home / Self Care | Payer: Medicare (Managed Care) | Source: Ambulatory Visit | Attending: Family Medicine | Admitting: Family Medicine

## 2019-09-12 ENCOUNTER — Other Ambulatory Visit: Payer: Self-pay | Admitting: Family Medicine

## 2019-09-12 ENCOUNTER — Other Ambulatory Visit: Payer: Self-pay

## 2019-09-12 DIAGNOSIS — Z8701 Personal history of pneumonia (recurrent): Secondary | ICD-10-CM

## 2019-09-12 DIAGNOSIS — J42 Unspecified chronic bronchitis: Secondary | ICD-10-CM

## 2019-09-12 DIAGNOSIS — J9611 Chronic respiratory failure with hypoxia: Secondary | ICD-10-CM

## 2019-09-13 ENCOUNTER — Telehealth: Payer: Self-pay | Admitting: Internal Medicine

## 2019-09-13 NOTE — Telephone Encounter (Signed)
Reviewed x-ray findings.  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  Will forward to Dr. Lovena Le who follows him regularly.  Tried to reach Dr. Bradd Burner but could not get through to the number we had on file.  Will reach out to the patient and ask him to hold amiodarone until further instructions per Dr. Lovena Le.  Sherren Mocha 09/13/2019 4:15 PM

## 2019-09-13 NOTE — Telephone Encounter (Signed)
Dr. Bradd Burner, with [facility] is calling stating that he assumes the patient has developed an amiodarone (PACERONE) toxicity due to abnormal findings in x-ray of the chest. Please call to discuss at [phone].

## 2019-09-13 NOTE — Telephone Encounter (Signed)
Instructed patient to STOP AMIODARONE. They are unsure which pill it is in his pillpack. Informed them [facility] would be called to confirm pill and I would call the patient back.  Called and spoke with Pharmacy. Confirmed the amiodarone dispensed is a white, scored circular tablet with M155 on one side and blank on the other.  Called and confirmed with the patient and his sister, who helps with his medications. She will take the amiodarone out of the pillpack and hold the medication until the patient is called with Dr. Tanna Furry recommendations next week.  She was grateful for assistance.

## 2019-09-13 NOTE — Telephone Encounter (Signed)
Dr. Bradd Burner from [facility] called in concerned about the patient having amiodarone toxicity based on his recent chest xray.  Sending to DOD Dr. Burt Knack to further advise.

## 2019-09-13 NOTE — Telephone Encounter (Signed)
Called the patient to let him know to hold Amiodarone because of his recent xray. Pt did not answer. LMTCB

## 2019-09-17 ENCOUNTER — Telehealth: Payer: Self-pay

## 2019-09-17 NOTE — Telephone Encounter (Signed)
Joseph Parma, RN    4:46 PM Note   Instructed patient to STOP AMIODARONE. They are unsure which pill it is in his pillpack. Informed them [facility] would be called to confirm pill and I would call the patient back.  Called and spoke with Pharmacy. Confirmed the amiodarone dispensed is a white, scored circular tablet with M155 on one side and blank on the other.  Called and confirmed with the patient and his sister, who helps with his medications. She will take the amiodarone out of the pillpack and hold the medication until the patient is called with Dr. Tanna Furry recommendations next week.  She was grateful for assistance.     Fields, Philis Fendt, RN to Me     4:38 PM Just an FYI-potential amiodarone toxicity-unable to reach patient      4:38 PM Joseph Parma, RN contacted Dr. Randa Spike, Philis Fendt, RN     4:37 PM Note   Called the patient to let him know to hold Amiodarone because of his recent xray. Pt did not answer. LMTCB    Sherren Mocha, MD to Wilma Flavin, RN . Joseph Parma, RN . Evans Lance, MD      4:16 PM Note   Reviewed x-ray findings.  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  Will forward to Dr. Lovena Le who follows him regularly.  Tried to reach Dr. Bradd Burner but could not get through to the number we had on file.  Will reach out to the patient and ask him to hold amiodarone until further instructions per Dr. Lovena Le.  Sherren Mocha 09/13/2019 4:15 PM     Fields, Philis Fendt, RN to Sherren Mocha, MD . Joseph Parma, RN       2:47 PM Note   Dr. Bradd Burner from [facility] called in concerned about the patient having amiodarone toxicity based on his recent chest xray.  Sending to DOD Dr. Burt Knack to further advise.          KT  2:09 PM Note   Dr. Bradd Burner, with [facility] is calling stating that he assumes the patient has developed an amiodarone  (PACERONE) toxicity due to abnormal findings in x-ray of the chest. Please call to discuss at [phone].    Dr. Bradd Burner to Avelina Laine E   DK  2:05 PM [facility]  Additional Documentation

## 2019-09-17 NOTE — Telephone Encounter (Signed)
Call placed to Pt.  Advised would discuss with Dr. Lovena Le on Thursday and call back with any instructions.  Pt indicates understanding.

## 2019-09-20 NOTE — Telephone Encounter (Signed)
Call placed to Pt's guardian.  Advised Dr. Lovena Le wants him to continue to NOT take the amiodarone.  Will plan on seeing Pt again in 6 months.  Advised guardian if in a few months Pt starts to c/o palpitations or trouble breathing she should call office to have Pt seen sooner.  Guardian indicates understanding.

## 2019-12-20 ENCOUNTER — Other Ambulatory Visit (HOSPITAL_COMMUNITY): Payer: Self-pay | Admitting: *Deleted

## 2019-12-23 ENCOUNTER — Other Ambulatory Visit: Payer: Self-pay

## 2019-12-23 ENCOUNTER — Ambulatory Visit (HOSPITAL_COMMUNITY)
Admission: RE | Admit: 2019-12-23 | Discharge: 2019-12-23 | Disposition: A | Discharge status: Home / Self Care | Payer: Medicare (Managed Care) | Source: Ambulatory Visit | Attending: Family Medicine | Admitting: Family Medicine

## 2019-12-23 DIAGNOSIS — D509 Iron deficiency anemia, unspecified: Secondary | ICD-10-CM | POA: Diagnosis present

## 2019-12-23 MED ORDER — SODIUM CHLORIDE 0.9 % IV SOLN
510.0000 mg | INTRAVENOUS | Status: DC
  Administered 2019-12-23: 510 mg via INTRAVENOUS
  Filled 2019-12-23: qty 17

## 2019-12-30 ENCOUNTER — Ambulatory Visit (HOSPITAL_COMMUNITY)
Admission: RE | Admit: 2019-12-30 | Discharge: 2019-12-30 | Disposition: A | Discharge status: Home / Self Care | Payer: Medicare (Managed Care) | Source: Ambulatory Visit | Attending: Family Medicine | Admitting: Family Medicine

## 2019-12-30 ENCOUNTER — Other Ambulatory Visit: Payer: Self-pay

## 2019-12-30 DIAGNOSIS — D509 Iron deficiency anemia, unspecified: Secondary | ICD-10-CM | POA: Insufficient documentation

## 2019-12-30 MED ORDER — SODIUM CHLORIDE 0.9 % IV SOLN
510.0000 mg | INTRAVENOUS | Status: DC
Start: 2019-12-30 — End: 2019-12-31
  Administered 2019-12-30: 510 mg via INTRAVENOUS
  Filled 2019-12-30: qty 17

## 2020-02-07 ENCOUNTER — Ambulatory Visit: Payer: Medicare (Managed Care) | Admitting: Internal Medicine

## 2020-02-24 ENCOUNTER — Other Ambulatory Visit: Payer: Self-pay

## 2020-02-24 DIAGNOSIS — I6523 Occlusion and stenosis of bilateral carotid arteries: Secondary | ICD-10-CM

## 2020-03-04 ENCOUNTER — Other Ambulatory Visit: Payer: Self-pay

## 2020-03-04 ENCOUNTER — Ambulatory Visit (HOSPITAL_COMMUNITY)
Admission: RE | Admit: 2020-03-04 | Discharge: 2020-03-04 | Disposition: A | Discharge status: Home / Self Care | Payer: Medicare (Managed Care) | Source: Ambulatory Visit | Attending: Vascular Surgery | Admitting: Vascular Surgery

## 2020-03-04 ENCOUNTER — Ambulatory Visit (INDEPENDENT_AMBULATORY_CARE_PROVIDER_SITE_OTHER): Payer: Medicare (Managed Care) | Admitting: Vascular Surgery

## 2020-03-04 ENCOUNTER — Encounter: Payer: Self-pay | Admitting: Vascular Surgery

## 2020-03-04 VITALS — BP 143/83 | HR 65 | Temp 97.0°F | Resp 20 | Ht 60.0 in | Wt 191.5 lb

## 2020-03-04 DIAGNOSIS — I6523 Occlusion and stenosis of bilateral carotid arteries: Secondary | ICD-10-CM

## 2020-03-04 NOTE — Progress Notes (Signed)
REASON FOR VISIT:   Follow-up of bilateral carotid disease.  MEDICAL ISSUES:   BILATERAL CAROTID DISEASE: The patient's left carotid endarterectomy site has a mild to moderate recurrent stenosis in the 40 to 59% range.  This has been stable.  He has an asymptomatic 60 to 79% right carotid stenosis which is also stable.  He understands we would not consider carotid endarterectomy less the stenosis on either side progressed to greater than 80%.  He is on aspirin and is on a statin.  He is asymptomatic.  Given that he is in the 60 to 79% range on the right we will get a follow-up duplex scan in 6 months and I will see him back at that time.  He knows to call sooner if he has problems.  HPI:   [name] is a pleasant 64 y.o. male who I have been following with bilateral carotid disease.  In 2017 he underwent combined left carotid endarterectomy along with coronary revascularization.  He developed some recurrent stenosis at the distal end of his patch up to 60 to 79% on the left but this subsequently improved.  At the time of his last visit he had a 66 to 59% left carotid stenosis with a 60 to 79% right carotid stenosis.  Since I saw him last, he denies any history of stroke, TIAs, expressive or receptive aphasia, or amaurosis fugax.  He is on aspirin and is on a statin.  Past Medical History:  Diagnosis Date  . Abdominal hernia    Chronic, not a good surgical candidate  . Abscess, abdomen 12/31/2010   Referred to Wound Care in 01/2011 because of multiple abd abscess with VERY large ventral hernia (please look at image of CT abd/pelvis 09/2010).  Because of hernia I was hesitant to I&D.      Marland Kitchen Anemia    History of Iron Def Anemia  . Anxiety   . AVM (arteriovenous malformation)    chronic GI blood loss  . Carotid artery disease ([place])    s/p CEA  . Chronic diastolic CHF (congestive heart failure) (HCC)    takes Lasix  . Chronic low back pain   . COPD (chronic obstructive pulmonary  disease) ([place])   . CVA (cerebral infarction) 09/2010   Bilateral with Left > Right  . Diabetes mellitus   . GERD (gastroesophageal reflux disease)   . History of nuclear stress test    Myoview 9/16:  Inferior, apical and inf-lateral ischemia; not gated; High Risk  . Hx of echocardiogram    Echo 5/16:  Mild LVH, EF 55%, indeterm. diast function, WMA could not be ruled out, MAC, trivial MR, mild LAE, normal RVF //  b. Echo 4/17: EF 55-60%, no RWMA, Gr 2 DD, Ao sclerosis, MAC, mild MS, mild LAE, PASP 55 mHg  . Hyperlipidemia   . Hypertension   . Intellectual disability    Sister helps to take care of him and takes him to appts  . Itching    all over body; pt scratches and has sores on bilateral arms and abdomen  . Lung nodule   . Myocardial infarction [facility]) 2016 ?   Heart attack  (  Per  pt. )  . Obesity   . Oxygen dependent    wears 2 liters at bedtime and when needed  . PAF (paroxysmal atrial fibrillation) ([place]) 06/2009   CHADS score 2 (HTN, DM) // Pradaxa for Afib // Pradaxa stopped due to worsening anemia (Hgb in  5/18 8.5 >> Pradaxa remains on hold)   . Pneumonia    hx of  . Stroke ([place])   . Tobacco user    Smokes 1ppd for multiple years.  Quit after hosp 09/2010.  . Tubular adenoma of colon   . Wears glasses     Family History  Problem Relation Age of Onset  . Heart disease Mother   . Hypertension Sister   . Heart disease Brother   . Heart disease Father   . Peripheral vascular disease Father   . Heart disease Maternal Grandmother   . Heart attack Maternal Grandmother   . Breast cancer Maternal Grandmother   . Stomach cancer Maternal Uncle   . Colon cancer Neg Hx   . Stroke Neg Hx     SOCIAL HISTORY: Social History   Tobacco Use  . Smoking status: Former Smoker    Packs/day: 1.00    Years: 42.00    Pack years: 42.00    Types: Cigarettes, Pipe    Quit date: 11/10/2010    Years since quitting: 9.3  . Smokeless tobacco: Former Systems developer    Quit date: 10/02/2010    Substance Use Topics  . Alcohol use: No    Alcohol/week: 0.0 standard drinks    Comment: " last drink was 2003" 08/11/15    Allergies  Allergen Reactions  . Penicillins Hives, Nausea And Vomiting, Swelling and Other (See Comments)    Tolerated Cefepime Has patient had a PCN reaction causing immediate rash, facial/tongue/throat swelling, SOB or lightheadedness with hypotension: YES Has patient had a PCN reaction causing severe rash involving mucus membranes or skin necrosis: No Has patient had a PCN reaction that required hospitalization No Has patient had a PCN reaction occurring within the last 10 years: No If all of the above answers are "NO", then may proceed with Cephalosporin use.     Current Outpatient Medications  Medication Sig Dispense Refill  . acetaminophen (TYLENOL) 325 MG tablet Take 650 mg by mouth 3 (three) times daily as needed for mild pain.    . Albuterol Sulfate 2.5 MG/0.5ML NEBU Inhale into the lungs.    Marland Kitchen amiodarone (PACERONE) 200 MG tablet Take one tablet by mouth daily Monday through Saturday. Do NOT take on Sunday. 90 tablet 3  . apixaban (ELIQUIS) 5 MG TABS tablet Take 5 mg by mouth 2 (two) times daily.    Marland Kitchen aspirin 81 MG EC tablet Take by mouth.    Marland Kitchen atorvastatin (LIPITOR) 80 MG tablet Take 1 tablet (80 mg total) by mouth daily. 30 tablet 0  . calcium carbonate (TUMS - DOSED IN MG ELEMENTAL CALCIUM) 500 MG chewable tablet Chew 1 tablet by mouth 3 (three) times daily as needed for indigestion or heartburn.    . Cholecalciferol (VITAMIN D3) 1000 UNITS CAPS Take 1,000 Units by mouth daily.    . Darbepoetin Alfa (ARANESP) 150 MCG/0.3ML SOSY injection Inject 0.3 mLs (150 mcg total) into the skin every Wednesday at 6 PM. 1.68 mL 0  . HYDROcodone-acetaminophen (NORCO/VICODIN) 5-325 MG tablet Take 1 tablet by mouth daily.    . insulin glargine (LANTUS) 100 UNIT/ML injection Inject 0.14 mLs (14 Units total) into the skin at bedtime. 10 mL 11  . ipratropium-albuterol  (DUONEB) 0.5-2.5 (3) MG/3ML SOLN Take 3 mLs by nebulization 2 (two) times daily.    . Multiple Vitamin (MULTIVITAMIN) capsule Take 1 capsule by mouth daily.    . multivitamin (RENA-VIT) TABS tablet Take 1 tablet by mouth at bedtime. 30 tablet  0  . nitroGLYCERIN (NITROSTAT) 0.4 MG SL tablet Place 0.4 mg under the tongue every 5 (five) minutes as needed for chest pain.    Marland Kitchen nystatin (MYCOSTATIN/NYSTOP) powder Apply topically 4 (four) times daily. Apply to inguinal area/scrotum and under pannus 15 g 0  . pantoprazole (PROTONIX) 40 MG tablet Take 40 mg by mouth daily.    Marland Kitchen PARoxetine (PAXIL) 40 MG tablet Take 40 mg by mouth daily.     . polyvinyl alcohol (LIQUIFILM TEARS) 1.4 % ophthalmic solution Place 1 drop into both eyes 2 (two) times daily as needed for dry eyes. Natural Tears    . risperiDONE (RISPERDAL) 0.5 MG tablet Take 0.5 mg by mouth 2 (two) times daily.    Marland Kitchen senna-docusate (SENOKOT-S) 8.6-50 MG tablet Take 1 tablet by mouth at bedtime as needed for mild constipation. 30 tablet 0  . Skin Protectants, Misc. (EUCERIN) cream Apply 1 application topically 2 (two) times daily. TO AFFECTED AREAS    . tiotropium (SPIRIVA) 18 MCG inhalation capsule Place into inhaler and inhale.    . traMADol (ULTRAM) 50 MG tablet Take 1-2 tablets (50-100 mg total) by mouth every 6 (six) hours as needed for moderate pain. 15 tablet 0  . furosemide (LASIX) 80 MG tablet Take 1 tablet (80 mg total) by mouth 2 (two) times daily as needed for fluid. 60 tablet 0   No current facility-administered medications for this visit.    REVIEW OF SYSTEMS:  _0  denotes positive finding, _1  denotes negative finding Cardiac  Comments:  Chest pain or chest pressure:    Shortness of breath upon exertion: x   Short of breath when lying flat:    Irregular heart rhythm:        Vascular    Pain in calf, thigh, or hip brought on by ambulation:    Pain in feet at night that wakes you up from your sleep:     Blood clot in your  veins:    Leg swelling:         Pulmonary    Oxygen at home:    Productive cough:     Wheezing:         Neurologic    Sudden weakness in arms or legs:     Sudden numbness in arms or legs:     Sudden onset of difficulty speaking or slurred speech:    Temporary loss of vision in one eye:     Problems with dizziness:         Gastrointestinal    Blood in stool:     Vomited blood:         Genitourinary    Burning when urinating:     Blood in urine:        Psychiatric    Major depression:         Hematologic    Bleeding problems:    Problems with blood clotting too easily:        Skin    Rashes or ulcers:        Constitutional    Fever or chills:     PHYSICAL EXAM:   Vitals:   03/04/20 1343 03/04/20 1346  BP: (!) 149/84 (!) 143/83  Pulse: 65   Resp: 20   Temp: (!) 97 F (36.1 C)   TempSrc: Temporal   SpO2: 96%   Weight: 86.9 kg   Height: 5' (1.524 m)     GENERAL: The patient is a well-nourished male, in no  acute distress. The vital signs are documented above. CARDIAC: There is a regular rate and rhythm.  VASCULAR: I do not detect carotid bruits. Both feet are warm and well perfused.  He has a right transmetatarsal amputation. PULMONARY: There is good air exchange bilaterally without wheezing or rales. ABDOMEN: Because of his size his abdomen is difficult to assess. MUSCULOSKELETAL: He has a right transmetatarsal amputation site which is healed. NEUROLOGIC: No focal weakness or paresthesias are detected. SKIN: There are no ulcers or rashes noted. PSYCHIATRIC: The patient has a normal affect.  DATA:    CAROTID DUPLEX: I have independently interpreted his carotid duplex scan today.  On the left side the left carotid endarterectomy site is widely patent with a mild to moderate 40 to 59% recurrent left carotid stenosis.  This is stable compared to a study 6 months ago.  The left vertebral artery is patent with antegrade flow.  On the right side he has a 60 to  79% stenosis.  This too is stable.  The right vertebral artery is patent with antegrade flow.  [name] [facility] [phone]

## 2020-03-06 ENCOUNTER — Other Ambulatory Visit: Payer: Self-pay | Admitting: *Deleted

## 2020-03-06 DIAGNOSIS — I6523 Occlusion and stenosis of bilateral carotid arteries: Secondary | ICD-10-CM

## 2020-03-27 ENCOUNTER — Encounter: Payer: Self-pay | Admitting: Internal Medicine

## 2020-03-27 ENCOUNTER — Other Ambulatory Visit: Payer: Self-pay

## 2020-03-27 ENCOUNTER — Ambulatory Visit: Payer: Medicare (Managed Care) | Admitting: Internal Medicine

## 2020-03-27 ENCOUNTER — Ambulatory Visit (INDEPENDENT_AMBULATORY_CARE_PROVIDER_SITE_OTHER): Payer: Medicare (Managed Care) | Admitting: Internal Medicine

## 2020-03-27 VITALS — BP 106/62 | HR 61 | Ht 60.0 in | Wt 194.0 lb

## 2020-03-27 DIAGNOSIS — I5042 Chronic combined systolic (congestive) and diastolic (congestive) heart failure: Secondary | ICD-10-CM | POA: Diagnosis not present

## 2020-03-27 DIAGNOSIS — I480 Paroxysmal atrial fibrillation: Secondary | ICD-10-CM

## 2020-03-27 DIAGNOSIS — I10 Essential (primary) hypertension: Secondary | ICD-10-CM | POA: Diagnosis not present

## 2020-03-27 NOTE — Patient Instructions (Signed)

## 2020-03-27 NOTE — Progress Notes (Signed)
HPI [name] returns today for ongoing evaluation and management of paroxysmal atrial fibrillation, diastolic heart failure, and hypertension.  He is a 64 year old man with all of the above problems who has maintained sinus rhythm very nicely on amiodarone.  He takes 1200 mg weekly.  He denies palpitations, chest pain, or syncope.  He admits to dietary indiscretion.  He has not been able to lose weight.  He quit smoking almost 10 years ago. He has class II dyspnea on exertion. Allergies  Allergen Reactions  . Penicillins Hives, Nausea And Vomiting, Swelling and Other (See Comments)    Tolerated Cefepime Has patient had a PCN reaction causing immediate rash, facial/tongue/throat swelling, SOB or lightheadedness with hypotension: YES Has patient had a PCN reaction causing severe rash involving mucus membranes or skin necrosis: No Has patient had a PCN reaction that required hospitalization No Has patient had a PCN reaction occurring within the last 10 years: No If all of the above answers are "NO", then may proceed with Cephalosporin use.      Current Outpatient Medications  Medication Sig Dispense Refill  . acetaminophen (TYLENOL) 325 MG tablet Take 650 mg by mouth 3 (three) times daily as needed for mild pain.    . Albuterol Sulfate 2.5 MG/0.5ML NEBU Inhale into the lungs.    [name] amiodarone (PACERONE) 200 MG tablet Take one tablet by mouth daily Monday through Saturday. Do NOT take on Sunday. 90 tablet 3  . apixaban (ELIQUIS) 5 MG TABS tablet Take 5 mg by mouth 2 (two) times daily.    [name] aspirin 81 MG EC tablet Take by mouth.    [name] atorvastatin (LIPITOR) 80 MG tablet Take 1 tablet (80 mg total) by mouth daily. 30 tablet 0  . calcium carbonate (TUMS - DOSED IN MG ELEMENTAL CALCIUM) 500 MG chewable tablet Chew 1 tablet by mouth 3 (three) times daily as needed for indigestion or heartburn.    . Cholecalciferol (VITAMIN D3) 1000 UNITS CAPS Take 1,000 Units by mouth daily.    .  Darbepoetin Alfa (ARANESP) 150 MCG/0.3ML SOSY injection Inject 0.3 mLs (150 mcg total) into the skin every Wednesday at 6 PM. 1.68 mL 0  . furosemide (LASIX) 80 MG tablet Take 1 tablet (80 mg total) by mouth 2 (two) times daily as needed for fluid. 60 tablet 0  . HYDROcodone-acetaminophen (NORCO/VICODIN) 5-325 MG tablet Take 1 tablet by mouth daily.    . insulin glargine (LANTUS) 100 UNIT/ML injection Inject 0.14 mLs (14 Units total) into the skin at bedtime. 10 mL 11  . ipratropium-albuterol (DUONEB) 0.5-2.5 (3) MG/3ML SOLN Take 3 mLs by nebulization 2 (two) times daily.    . Multiple Vitamin (MULTIVITAMIN) capsule Take 1 capsule by mouth daily.    . multivitamin (RENA-VIT) TABS tablet Take 1 tablet by mouth at bedtime. 30 tablet 0  . nitroGLYCERIN (NITROSTAT) 0.4 MG SL tablet Place 0.4 mg under the tongue every 5 (five) minutes as needed for chest pain.    [name] nystatin (MYCOSTATIN/NYSTOP) powder Apply topically 4 (four) times daily. Apply to inguinal area/scrotum and under pannus 15 g 0  . pantoprazole (PROTONIX) 40 MG tablet Take 40 mg by mouth daily.    [name] PARoxetine (PAXIL) 40 MG tablet Take 40 mg by mouth daily.     . polyvinyl alcohol (LIQUIFILM TEARS) 1.4 % ophthalmic solution Place 1 drop into both eyes 2 (two) times daily as needed for dry eyes. Natural Tears    . risperiDONE (RISPERDAL) 0.5  MG tablet Take 0.5 mg by mouth 2 (two) times daily.    [name] senna-docusate (SENOKOT-S) 8.6-50 MG tablet Take 1 tablet by mouth at bedtime as needed for mild constipation. 30 tablet 0  . Skin Protectants, Misc. (EUCERIN) cream Apply 1 application topically 2 (two) times daily. TO AFFECTED AREAS    . tiotropium (SPIRIVA) 18 MCG inhalation capsule Place into inhaler and inhale.    . traMADol (ULTRAM) 50 MG tablet Take 1-2 tablets (50-100 mg total) by mouth every 6 (six) hours as needed for moderate pain. 15 tablet 0   No current facility-administered medications for this visit.     Past Medical History:    Diagnosis Date  . Abdominal hernia    Chronic, not a good surgical candidate  . Abscess, abdomen 12/31/2010   Referred to Wound Care in 01/2011 because of multiple abd abscess with VERY large ventral hernia (please look at image of CT abd/pelvis 09/2010).  Because of hernia I was hesitant to I&D.      [name] Anemia    History of Iron Def Anemia  . Anxiety   . AVM (arteriovenous malformation)    chronic GI blood loss  . Carotid artery disease ([place])    s/p CEA  . Chronic diastolic CHF (congestive heart failure) (HCC)    takes Lasix  . Chronic low back pain   . COPD (chronic obstructive pulmonary disease) ([place])   . CVA (cerebral infarction) 09/2010   Bilateral with Left > Right  . Diabetes mellitus   . GERD (gastroesophageal reflux disease)   . History of nuclear stress test    Myoview 9/16:  Inferior, apical and inf-lateral ischemia; not gated; High Risk  . Hx of echocardiogram    Echo 5/16:  Mild LVH, EF 55%, indeterm. diast function, WMA could not be ruled out, MAC, trivial MR, mild LAE, normal RVF //  b. Echo 4/17: EF 55-60%, no RWMA, Gr 2 DD, Ao sclerosis, MAC, mild MS, mild LAE, PASP 55 mHg  . Hyperlipidemia   . Hypertension   . Intellectual disability    Sister helps to take care of him and takes him to appts  . Itching    all over body; pt scratches and has sores on bilateral arms and abdomen  . Lung nodule   . Myocardial infarction [facility]) 2016 ?   Heart attack  (  Per  pt. )  . Obesity   . Oxygen dependent    wears 2 liters at bedtime and when needed  . PAF (paroxysmal atrial fibrillation) ([place]) 06/2009   CHADS score 2 (HTN, DM) // Pradaxa for Afib // Pradaxa stopped due to worsening anemia (Hgb in 5/18 8.5 >> Pradaxa remains on hold)   . Pneumonia    hx of  . Stroke ([place])   . Tobacco user    Smokes 1ppd for multiple years.  Quit after hosp 09/2010.  . Tubular adenoma of colon   . Wears glasses     ROS:   All systems reviewed and negative except as noted in the  HPI.   Past Surgical History:  Procedure Laterality Date  . AMPUTATION Right 03/29/2013   Procedure: AMPUTATION RAY;  Surgeon: Newt Minion, MD;  Location: [place];  Service: Orthopedics;  Laterality: Right;  Right Foot 3rd and Possible 4th Ray Amputation  . AMPUTATION Right 04/23/2013   Procedure: AMPUTATION RIGHT MID-FOOT;  Surgeon: Newt Minion, MD;  Location: [place];  Service: Orthopedics;  Laterality: Right;  .  arm surgery Left    as a child  . CARDIAC CATHETERIZATION N/A 04/30/2015   Procedure: Left Heart Cath and Coronary Angiography;  Surgeon: Belva Crome, MD;  Location: [place] [facility];  Service: Cardiovascular;  Laterality: N/A;  . Carotid arteriogram  10/2010   30% right ICA stenosis, 40% left ICA stenosis   . CAROTID ENDARTERECTOMY Left 08-13-15   CEA  . CATARACT EXTRACTION, BILATERAL    . COLONOSCOPY WITH PROPOFOL N/A 06/10/2014   Procedure: COLONOSCOPY WITH PROPOFOL;  Surgeon: Jerene Bears, MD;  Location: [facility];  Service: Gastroenterology;  Laterality: N/A;  . CORONARY ARTERY BYPASS GRAFT N/A 08/13/2015   Procedure: CORONARY ARTERY BYPASS GRAFTING (CABG), ON PUMP, TIMES THREE, USING LEFT INTERNAL MAMMARY ARTERY, RIGHT GREATER SAPHENOUS VEIN HARVESTED ENDOSCOPICALLY;  Surgeon: [name], MD;  Location: [place];  Service: Open Heart Surgery;  Laterality: N/A;  . DEBRIDMENT OF DECUBITUS ULCER Right 02/13/2013  . ENDARTERECTOMY Left 08/13/2015   Procedure: ENDARTERECTOMY CAROTID;  Surgeon: Angelia Mould, MD;  Location: [place];  Service: Vascular;  Laterality: Left;  . ESOPHAGOGASTRODUODENOSCOPY (EGD) WITH PROPOFOL N/A 06/10/2014   Procedure: ESOPHAGOGASTRODUODENOSCOPY (EGD) WITH PROPOFOL;  Surgeon: Jerene Bears, MD;  [facility];  Service: Gastroenterology;  Laterality: N/A;  . GIVENS CAPSULE STUDY N/A 07/09/2014   Procedure: GIVENS CAPSULE STUDY;  Surgeon: Jerene Bears, MD;  Location: [facility];  Service: Gastroenterology;  Laterality: N/A;  . I & D  EXTREMITY Right 02/13/2013   Procedure: IRRIGATION AND DEBRIDEMENT FOOT ULCER;  Surgeon: Johnny Bridge, MD;  Location: [place];  Service: Orthopedics;  Laterality: Right;  PULSE LAVAGE  . INSERTION OF DIALYSIS CATHETER N/A 10/02/2017   Procedure: INSERTION OF TUNNELED DIALYSIS CATHETER;  Surgeon: Elam Dutch, MD;  Location: [facility];  Service: Vascular;  Laterality: N/A;  . IR REMOVAL TUN CV CATH W/O FL  10/25/2017  . MULTIPLE TOOTH EXTRACTIONS    . TEE WITHOUT CARDIOVERSION N/A 08/13/2015   Procedure: TRANSESOPHAGEAL ECHOCARDIOGRAM (TEE);  Surgeon: [name], MD;  Location: [place];  Service: Open Heart Surgery;  Laterality: N/A;  . TEMPORARY PACEMAKER N/A 09/15/2017   Procedure: TEMPORARY PACEMAKER;  Surgeon: Jettie Booze, MD;  Location: [place] [facility];  Service: Cardiovascular;  Laterality: N/A;  . TRANSESOPHAGEAL ECHOCARDIOGRAM  09/2010   No ASD or PFO. EF 60-65%.  Normal systolic function. No evidence of thrombus.   . TRANSTHORACIC ECHOCARDIOGRAM  09/2010    The cavity size was normal. Systolic function was vigorous.  EF 65-70%.  Normal wall funciton.   [name] ULTRASOUND GUIDANCE FOR VASCULAR ACCESS  09/15/2017   Procedure: Ultrasound Guidance For Vascular Access;  Surgeon: Jettie Booze, MD;  Location: [place] [facility];  Service: Cardiovascular;;     Family History  Problem Relation Age of Onset  . Heart disease Mother   . Hypertension Sister   . Heart disease Brother   . Heart disease Father   . Peripheral vascular disease Father   . Heart disease Maternal Grandmother   . Heart attack Maternal Grandmother   . Breast cancer Maternal Grandmother   . Stomach cancer Maternal Uncle   . Colon cancer Neg Hx   . Stroke Neg Hx      Social History   Socioeconomic History  . Marital status: Single    Spouse name: Not on file  . Number of children: 0  . Years of education: Not on file  . Highest education level: Not  on file  Occupational History    Employer: DISABLED   Tobacco Use  . Smoking status: Former Smoker    Packs/day: 1.00    Years: 42.00    Pack years: 42.00    Types: Cigarettes, Pipe    Quit date: 11/10/2010    Years since quitting: 9.3  . Smokeless tobacco: Former Systems developer    Quit date: 10/02/2010  Vaping Use  . Vaping Use: Never used  Substance and Sexual Activity  . Alcohol use: No    Alcohol/week: 0.0 standard drinks    Comment: " last drink was 2003" 08/11/15  . Drug use: No  . Sexual activity: Not on file  Other Topics Concern  . Not on file  Social History Narrative   Released from prison in 2003 after serving 2 years for indecency with a minor.    He smokes cigarettes greater than 30 years, also smoked a pipe.  Quit tobacco 09/2010.   Denies EtOH and drugs.    Social Determinants of Health   Financial Resource Strain:   . Difficulty of Paying Living Expenses: Not on file  Food Insecurity:   . Worried About Charity fundraiser in the Last Year: Not on file  . Ran Out of Food in the Last Year: Not on file  Transportation Needs:   . Lack of Transportation (Medical): Not on file  . Lack of Transportation (Non-Medical): Not on file  Physical Activity:   . Days of Exercise per Week: Not on file  . Minutes of Exercise per Session: Not on file  Stress:   . Feeling of Stress : Not on file  Social Connections:   . Frequency of Communication with Friends and Family: Not on file  . Frequency of Social Gatherings with Friends and Family: Not on file  . Attends Religious Services: Not on file  . Active Member of Clubs or Organizations: Not on file  . Attends Archivist Meetings: Not on file  . Marital Status: Not on file  Intimate Partner Violence:   . Fear of Current or Ex-Partner: Not on file  . Emotionally Abused: Not on file  . Physically Abused: Not on file  . Sexually Abused: Not on file     BP 106/62   Pulse 61   Ht 5' (1.524 m)   Wt 194 lb (88 kg)   SpO2 95%   BMI 37.89 kg/m   Physical  Exam:  Chronically ill appearing 64 year old man, NAD HEENT: Unremarkable Neck: 6 cm JVD, no thyromegally Lymphatics:  No adenopathy Back:  No CVA tenderness Lungs:  Clear, except for rales in the bases bilaterally. HEART:  Regular rate rhythm, no murmurs, no rubs, no clicks Abd:  soft, positive bowel sounds, no organomegally, no rebound, no guarding Ext:  2 plus pulses, 1+ peripheral edema, no cyanosis, no clubbing Skin:  No rashes no nodules Neuro:  CN II through XII intact, motor grossly intact  EKG -normal sinus rhythm with right bundle branch block  Assess/Plan: 1.  Paroxysmal atrial fibrillation -he is maintaining sinus rhythm on amiodarone.  He will continue his current medications. 2.  Obesity -he has gained 4 pound since his last visit.  I encouraged the patient to lose weight. 3.  Diastolic heart failure -despite sodium indiscretion, he appears to be euvolemic today.  I have asked the patient to reduce his salt intake. 4.  Sinus node dysfunction -he has had no symptomatic pauses.  He will undergo watchful waiting.  Currently no  indication for pacemaker.  Cristopher [place], MD

## 2020-03-31 NOTE — Addendum Note (Signed)
Addended by: Dominik Yordy H on: 03/31/2020 02:53 PM   Modules accepted: Orders  

## 2020-06-30 ENCOUNTER — Other Ambulatory Visit: Payer: Self-pay | Admitting: Family Medicine

## 2020-06-30 ENCOUNTER — Ambulatory Visit
Admission: RE | Admit: 2020-06-30 | Discharge: 2020-06-30 | Disposition: A | Discharge status: Home / Self Care | Payer: Medicare (Managed Care) | Source: Ambulatory Visit | Attending: Family Medicine | Admitting: Family Medicine

## 2020-06-30 DIAGNOSIS — R059 Cough, unspecified: Secondary | ICD-10-CM

## 2020-06-30 DIAGNOSIS — J449 Chronic obstructive pulmonary disease, unspecified: Secondary | ICD-10-CM

## 2020-09-01 ENCOUNTER — Other Ambulatory Visit: Payer: Self-pay

## 2020-09-01 DIAGNOSIS — I6523 Occlusion and stenosis of bilateral carotid arteries: Secondary | ICD-10-CM

## 2020-09-23 ENCOUNTER — Ambulatory Visit (HOSPITAL_COMMUNITY)
Admission: RE | Admit: 2020-09-23 | Discharge: 2020-09-23 | Disposition: A | Discharge status: Home / Self Care | Payer: Medicare (Managed Care) | Source: Ambulatory Visit | Attending: Vascular Surgery | Admitting: Vascular Surgery

## 2020-09-23 ENCOUNTER — Ambulatory Visit (INDEPENDENT_AMBULATORY_CARE_PROVIDER_SITE_OTHER): Payer: Medicare (Managed Care) | Admitting: Vascular Surgery

## 2020-09-23 ENCOUNTER — Other Ambulatory Visit: Payer: Self-pay

## 2020-09-23 ENCOUNTER — Encounter: Payer: Self-pay | Admitting: Vascular Surgery

## 2020-09-23 VITALS — BP 170/70 | HR 56 | Temp 97.7°F | Resp 20 | Ht 60.0 in | Wt 178.0 lb

## 2020-09-23 DIAGNOSIS — I6523 Occlusion and stenosis of bilateral carotid arteries: Secondary | ICD-10-CM | POA: Insufficient documentation

## 2020-09-23 NOTE — Progress Notes (Signed)
REASON FOR VISIT:   Follow-up of bilateral carotid disease.  MEDICAL ISSUES:   BILATERAL CAROTID DISEASE: This patient has a moderate recurrent left carotid stenosis of 40 to 59%.  His right carotid stenosis of 60 to 79% is stable.  He understands we would not consider carotid endarterectomy unless one of the stenosis progressed to greater than 80% or he develop new neurologic symptoms.  I have ordered a follow-up carotid duplex scan in 6 months and will have him seen by one of the PAs for routine follow-up at that time.  He knows to call sooner if he has problems. He will remain on his aspirin and statin.   HPI:   [name] is a pleasant 65 y.o. male who I last saw on 03/04/20.  I been following him with bilateral carotid disease.  In 2017 he underwent combined left carotid endarterectomy concomitant with coronary revascularization.  He developed some recurrent stenosis at the distal end of his patch up to 60 to 79% on the left but this subsequently improved.  When I saw him last he had a 32 to 59% recurrent left carotid stenosis with a 60 to 79% right carotid stenosis.  He was set up for 105-monthfollow-up visit.  Since I saw him last he denies any history of stroke, TIAs, expressive or receptive aphasia, or amaurosis fugax.  He is on aspirin and a statin.  He is also on Eliquis.  He scratches his legs a lot and does have some scratches on the left leg but these appear to be healing.  Past Medical History:  Diagnosis Date  . Abdominal hernia    Chronic, not a good surgical candidate  . Abscess, abdomen 12/31/2010   Referred to Wound Care in 01/2011 because of multiple abd abscess with VERY large ventral hernia (please look at image of CT abd/pelvis 09/2010).  Because of hernia I was hesitant to I&D.      .Marland KitchenAnemia    History of Iron Def Anemia  . Anxiety   . AVM (arteriovenous malformation)    chronic GI blood loss  . [place]    s/p CEA  . Chronic  diastolic CHF (congestive heart failure) (HCC)    takes Lasix  . Chronic low back pain   . [place]   . CVA (cerebral infarction) 09/2010   Bilateral with Left > Right  . Diabetes mellitus   . GERD (gastroesophageal reflux disease)   . History of nuclear stress test    Myoview 9/16:  Inferior, apical and inf-lateral ischemia; not gated; High Risk  . Hx of echocardiogram    Echo 5/16:  Mild LVH, EF 55%, indeterm. diast function, WMA could not be ruled out, MAC, trivial MR, mild LAE, normal RVF //  b. Echo 4/17: EF 55-60%, no RWMA, Gr 2 DD, Ao sclerosis, MAC, mild MS, mild LAE, PASP 55 mHg  . Hyperlipidemia   . Hypertension   . Intellectual disability    Sister helps to take care of him and takes him to appts  . Itching    all over body; pt scratches and has sores on bilateral arms and abdomen  . Lung nodule   . Myocardial [facility] ?   Heart attack  (  Per  pt. )  . Obesity   . Oxygen dependent    wears 2 liters at bedtime and when needed  . [place]  CHADS score 2 (HTN, DM) // Pradaxa for Afib // Pradaxa stopped due to worsening anemia (Hgb in 5/18 8.5 >> Pradaxa remains on hold)   . Pneumonia    hx of  . Stroke ([place])   . Tobacco user    Smokes 1ppd for multiple years.  Quit after hosp 09/2010.  . Tubular adenoma of colon   . Wears glasses     Family History  Problem Relation Age of Onset  . Heart disease Mother   . Hypertension Sister   . Heart disease Brother   . Heart disease Father   . Peripheral vascular disease Father   . Heart disease Maternal Grandmother   . Heart attack Maternal Grandmother   . Breast cancer Maternal Grandmother   . Stomach cancer Maternal Uncle   . Colon cancer Neg Hx   . Stroke Neg Hx     SOCIAL HISTORY: Social History   Tobacco Use  . Smoking status: Former Smoker    Packs/day: 1.00    Years: 42.00    Pack years: 42.00    Types:  Cigarettes, Pipe    Quit date: 11/10/2010    Years since quitting: 9.8  . Smokeless tobacco: Former Systems developer    Quit date: 10/02/2010  Substance Use Topics  . Alcohol use: No    Alcohol/week: 0.0 standard drinks    Comment: " last drink was 2003" 08/11/15    Allergies  Allergen Reactions  . Penicillins Hives, Nausea And Vomiting, Swelling and Other (See Comments)    Tolerated Cefepime Has patient had a PCN reaction causing immediate rash, facial/tongue/throat swelling, SOB or lightheadedness with hypotension: YES Has patient had a PCN reaction causing severe rash involving mucus membranes or skin necrosis: No Has patient had a PCN reaction that required hospitalization No Has patient had a PCN reaction occurring within the last 10 years: No If all of the above answers are "NO", then may proceed with Cephalosporin use.     Current Outpatient Medications  Medication Sig Dispense Refill  . acetaminophen (TYLENOL) 325 MG tablet Take 650 mg by mouth 3 (three) times daily as needed for mild pain.    . Albuterol Sulfate 2.5 MG/0.5ML NEBU Inhale into the lungs.    Marland Kitchen amiodarone (PACERONE) 200 MG tablet Take one tablet by mouth daily Monday through Saturday. Do NOT take on Sunday. 90 tablet 3  . apixaban (ELIQUIS) 5 MG TABS tablet Take 5 mg by mouth 2 (two) times daily.    Marland Kitchen aspirin 81 MG EC tablet Take by mouth.    Marland Kitchen atorvastatin (LIPITOR) 80 MG tablet Take 1 tablet (80 mg total) by mouth daily. 30 tablet 0  . calcium carbonate (TUMS - DOSED IN MG ELEMENTAL CALCIUM) 500 MG chewable tablet Chew 1 tablet by mouth 3 (three) times daily as needed for indigestion or heartburn.    . Cholecalciferol (VITAMIN D3) 1000 UNITS CAPS Take 1,000 Units by mouth daily.    . Darbepoetin Alfa (ARANESP) 150 MCG/0.3ML SOSY injection Inject 0.3 mLs (150 mcg total) into the skin every Wednesday at 6 PM. 1.68 mL 0  . HYDROcodone-acetaminophen (NORCO/VICODIN) 5-325 MG tablet Take 1 tablet by mouth daily.    . insulin  glargine (LANTUS) 100 UNIT/ML injection Inject 0.14 mLs (14 Units total) into the skin at bedtime. 10 mL 11  . ipratropium-albuterol (DUONEB) 0.5-2.5 (3) MG/3ML SOLN Take 3 mLs by nebulization 2 (two) times daily.    . Multiple Vitamin (MULTIVITAMIN) capsule Take 1 capsule by mouth daily.    Marland Kitchen  multivitamin (RENA-VIT) TABS tablet Take 1 tablet by mouth at bedtime. 30 tablet 0  . nitroGLYCERIN (NITROSTAT) 0.4 MG SL tablet Place 0.4 mg under the tongue every 5 (five) minutes as needed for chest pain.    Marland Kitchen nystatin (MYCOSTATIN/NYSTOP) powder Apply topically 4 (four) times daily. Apply to inguinal area/scrotum and under pannus 15 g 0  . pantoprazole (PROTONIX) 40 MG tablet Take 40 mg by mouth daily.    Marland Kitchen PARoxetine (PAXIL) 40 MG tablet Take 40 mg by mouth daily.     . polyvinyl alcohol (LIQUIFILM TEARS) 1.4 % ophthalmic solution Place 1 drop into both eyes 2 (two) times daily as needed for dry eyes. Natural Tears    . risperiDONE (RISPERDAL) 0.5 MG tablet Take 0.5 mg by mouth 2 (two) times daily.    Marland Kitchen senna-docusate (SENOKOT-S) 8.6-50 MG tablet Take 1 tablet by mouth at bedtime as needed for mild constipation. 30 tablet 0  . Skin Protectants, Misc. (EUCERIN) cream Apply 1 application topically 2 (two) times daily. TO AFFECTED AREAS    . tiotropium (SPIRIVA) 18 MCG inhalation capsule Place into inhaler and inhale.    . traMADol (ULTRAM) 50 MG tablet Take 1-2 tablets (50-100 mg total) by mouth every 6 (six) hours as needed for moderate pain. 15 tablet 0  . furosemide (LASIX) 80 MG tablet Take 1 tablet (80 mg total) by mouth 2 (two) times daily as needed for fluid. 60 tablet 0   No current facility-administered medications for this visit.    REVIEW OF SYSTEMS:  _0  denotes positive finding, _1  denotes negative finding Cardiac  Comments:  Chest pain or chest pressure:    Shortness of breath upon exertion:    Short of breath when lying flat:    Irregular heart rhythm:        Vascular    Pain in  calf, thigh, or hip brought on by ambulation:    Pain in feet at night that wakes you up from your sleep:     Blood clot in your veins:    Leg swelling:         Pulmonary    Oxygen at home:    Productive cough:     Wheezing:         Neurologic    Sudden weakness in arms or legs:     Sudden numbness in arms or legs:     Sudden onset of difficulty speaking or slurred speech:    Temporary loss of vision in one eye:     Problems with dizziness:         Gastrointestinal    Blood in stool:     Vomited blood:         Genitourinary    Burning when urinating:     Blood in urine:        Psychiatric    Major depression:         Hematologic    Bleeding problems:    Problems with blood clotting too easily:        Skin    Rashes or ulcers:        Constitutional    Fever or chills:     PHYSICAL EXAM:   Vitals:   09/23/20 1050 09/23/20 1053  BP: (!) 172/68 (!) 170/70  Pulse: (!) 56   Resp: 20   Temp: 97.7 F (36.5 C)   SpO2: 100%   Weight: 80.7 kg   Height: 5' (1.524 m)  GENERAL: The patient is a well-nourished male, in no acute distress. The vital signs are documented above. CARDIAC: There is a regular rate and rhythm.  VASCULAR: He has bilateral carotid bruits. Both feet are warm and well perfused. PULMONARY: There is good air exchange bilaterally without wheezing or rales. ABDOMEN: Soft and non-tender with normal pitched bowel sounds.  MUSCULOSKELETAL: There are no major deformities or cyanosis. NEUROLOGIC: No focal weakness or paresthesias are detected. SKIN: There are no ulcers or rashes noted. PSYCHIATRIC: The patient has a normal affect.  DATA:    CAROTID DUPLEX: I have independently interpreted his carotid duplex scan today.  On the right side there is a 60 to 79% stenosis.  The right vertebral artery is patent with antegrade flow.  On the left side there is a 40 to 59% stenosis.  The left vertebral artery is patent with antegrade flow.  [name] [facility] [phone]

## 2020-09-24 ENCOUNTER — Other Ambulatory Visit: Payer: Self-pay

## 2020-09-24 DIAGNOSIS — I6523 Occlusion and stenosis of bilateral carotid arteries: Secondary | ICD-10-CM

## 2020-10-27 ENCOUNTER — Other Ambulatory Visit: Payer: Self-pay | Admitting: Family Medicine

## 2020-10-27 ENCOUNTER — Ambulatory Visit
Admission: RE | Admit: 2020-10-27 | Discharge: 2020-10-27 | Disposition: A | Discharge status: Home / Self Care | Payer: Medicare (Managed Care) | Source: Ambulatory Visit | Attending: Family Medicine | Admitting: Family Medicine

## 2020-10-27 DIAGNOSIS — W19XXXA Unspecified fall, initial encounter: Secondary | ICD-10-CM

## 2020-10-27 DIAGNOSIS — R52 Pain, unspecified: Secondary | ICD-10-CM

## 2021-03-22 ENCOUNTER — Other Ambulatory Visit: Payer: Self-pay | Admitting: Family Medicine

## 2021-03-22 ENCOUNTER — Other Ambulatory Visit: Payer: Self-pay

## 2021-03-22 ENCOUNTER — Ambulatory Visit
Admission: RE | Admit: 2021-03-22 | Discharge: 2021-03-22 | Disposition: A | Discharge status: Home / Self Care | Payer: Medicare (Managed Care) | Source: Ambulatory Visit | Attending: Family Medicine | Admitting: Family Medicine

## 2021-03-22 DIAGNOSIS — R0689 Other abnormalities of breathing: Secondary | ICD-10-CM

## 2021-03-23 ENCOUNTER — Emergency Department (HOSPITAL_COMMUNITY): Payer: Medicare (Managed Care)

## 2021-03-23 ENCOUNTER — Other Ambulatory Visit: Payer: Self-pay

## 2021-03-23 ENCOUNTER — Inpatient Hospital Stay (HOSPITAL_COMMUNITY)
Admission: EM | Admit: 2021-03-23 | Discharge: 2021-04-02 | DRG: 226 | Disposition: A | Discharge status: Skilled Nursing Facility | Payer: Medicare (Managed Care) | Source: Ambulatory Visit | Attending: Internal Medicine | Admitting: Internal Medicine

## 2021-03-23 ENCOUNTER — Inpatient Hospital Stay (HOSPITAL_COMMUNITY): Payer: Medicare (Managed Care)

## 2021-03-23 ENCOUNTER — Ambulatory Visit: Payer: Medicare (Managed Care)

## 2021-03-23 ENCOUNTER — Encounter (HOSPITAL_COMMUNITY)
Admission: EM | Disposition: A | Discharge status: Skilled Nursing Facility | Payer: Self-pay | Source: Ambulatory Visit | Attending: Internal Medicine

## 2021-03-23 ENCOUNTER — Encounter (HOSPITAL_COMMUNITY): Payer: Self-pay | Admitting: Emergency Medicine

## 2021-03-23 ENCOUNTER — Encounter (HOSPITAL_COMMUNITY): Payer: Medicare (Managed Care)

## 2021-03-23 DIAGNOSIS — Z880 Allergy status to penicillin: Secondary | ICD-10-CM

## 2021-03-23 DIAGNOSIS — Z79899 Other long term (current) drug therapy: Secondary | ICD-10-CM

## 2021-03-23 DIAGNOSIS — Z951 Presence of aortocoronary bypass graft: Secondary | ICD-10-CM

## 2021-03-23 DIAGNOSIS — I739 Peripheral vascular disease, unspecified: Secondary | ICD-10-CM

## 2021-03-23 DIAGNOSIS — I462 Cardiac arrest due to underlying cardiac condition: Secondary | ICD-10-CM | POA: Diagnosis present

## 2021-03-23 DIAGNOSIS — I5022 Chronic systolic (congestive) heart failure: Secondary | ICD-10-CM | POA: Diagnosis not present

## 2021-03-23 DIAGNOSIS — R6259 Other lack of expected normal physiological development in childhood: Secondary | ICD-10-CM | POA: Diagnosis present

## 2021-03-23 DIAGNOSIS — R1312 Dysphagia, oropharyngeal phase: Secondary | ICD-10-CM | POA: Diagnosis present

## 2021-03-23 DIAGNOSIS — Z515 Encounter for palliative care: Secondary | ICD-10-CM

## 2021-03-23 DIAGNOSIS — R739 Hyperglycemia, unspecified: Secondary | ICD-10-CM

## 2021-03-23 DIAGNOSIS — R339 Retention of urine, unspecified: Secondary | ICD-10-CM | POA: Diagnosis not present

## 2021-03-23 DIAGNOSIS — U071 COVID-19: Secondary | ICD-10-CM | POA: Diagnosis present

## 2021-03-23 DIAGNOSIS — J440 Chronic obstructive pulmonary disease with acute lower respiratory infection: Secondary | ICD-10-CM | POA: Diagnosis present

## 2021-03-23 DIAGNOSIS — E785 Hyperlipidemia, unspecified: Secondary | ICD-10-CM | POA: Diagnosis present

## 2021-03-23 DIAGNOSIS — J9611 Chronic respiratory failure with hypoxia: Secondary | ICD-10-CM | POA: Diagnosis present

## 2021-03-23 DIAGNOSIS — R1909 Other intra-abdominal and pelvic swelling, mass and lump: Secondary | ICD-10-CM | POA: Diagnosis present

## 2021-03-23 DIAGNOSIS — Z20822 Contact with and (suspected) exposure to covid-19: Secondary | ICD-10-CM | POA: Diagnosis not present

## 2021-03-23 DIAGNOSIS — E1122 Type 2 diabetes mellitus with diabetic chronic kidney disease: Secondary | ICD-10-CM | POA: Diagnosis present

## 2021-03-23 DIAGNOSIS — Z8673 Personal history of transient ischemic attack (TIA), and cerebral infarction without residual deficits: Secondary | ICD-10-CM

## 2021-03-23 DIAGNOSIS — Z87891 Personal history of nicotine dependence: Secondary | ICD-10-CM

## 2021-03-23 DIAGNOSIS — Z79891 Long term (current) use of opiate analgesic: Secondary | ICD-10-CM

## 2021-03-23 DIAGNOSIS — Z950 Presence of cardiac pacemaker: Secondary | ICD-10-CM | POA: Diagnosis not present

## 2021-03-23 DIAGNOSIS — I480 Paroxysmal atrial fibrillation: Secondary | ICD-10-CM | POA: Diagnosis not present

## 2021-03-23 DIAGNOSIS — N1831 Chronic kidney disease, stage 3a: Secondary | ICD-10-CM | POA: Diagnosis present

## 2021-03-23 DIAGNOSIS — I5043 Acute on chronic combined systolic (congestive) and diastolic (congestive) heart failure: Secondary | ICD-10-CM | POA: Diagnosis present

## 2021-03-23 DIAGNOSIS — I255 Ischemic cardiomyopathy: Secondary | ICD-10-CM | POA: Diagnosis present

## 2021-03-23 DIAGNOSIS — R55 Syncope and collapse: Secondary | ICD-10-CM | POA: Diagnosis present

## 2021-03-23 DIAGNOSIS — Z6837 Body mass index (BMI) 37.0-37.9, adult: Secondary | ICD-10-CM

## 2021-03-23 DIAGNOSIS — Z9981 Dependence on supplemental oxygen: Secondary | ICD-10-CM

## 2021-03-23 DIAGNOSIS — N189 Chronic kidney disease, unspecified: Secondary | ICD-10-CM | POA: Diagnosis not present

## 2021-03-23 DIAGNOSIS — N179 Acute kidney failure, unspecified: Secondary | ICD-10-CM | POA: Diagnosis not present

## 2021-03-23 DIAGNOSIS — I469 Cardiac arrest, cause unspecified: Secondary | ICD-10-CM | POA: Diagnosis not present

## 2021-03-23 DIAGNOSIS — N183 Chronic kidney disease, stage 3 unspecified: Secondary | ICD-10-CM | POA: Diagnosis not present

## 2021-03-23 DIAGNOSIS — F79 Unspecified intellectual disabilities: Secondary | ICD-10-CM | POA: Diagnosis present

## 2021-03-23 DIAGNOSIS — J1282 Pneumonia due to coronavirus disease 2019: Secondary | ICD-10-CM | POA: Diagnosis present

## 2021-03-23 DIAGNOSIS — Z7189 Other specified counseling: Secondary | ICD-10-CM | POA: Diagnosis not present

## 2021-03-23 DIAGNOSIS — Z8249 Family history of ischemic heart disease and other diseases of the circulatory system: Secondary | ICD-10-CM

## 2021-03-23 DIAGNOSIS — R9431 Abnormal electrocardiogram [ECG] [EKG]: Secondary | ICD-10-CM | POA: Diagnosis not present

## 2021-03-23 DIAGNOSIS — Z930 Tracheostomy status: Secondary | ICD-10-CM

## 2021-03-23 DIAGNOSIS — Z789 Other specified health status: Secondary | ICD-10-CM | POA: Diagnosis not present

## 2021-03-23 DIAGNOSIS — I495 Sick sinus syndrome: Secondary | ICD-10-CM | POA: Diagnosis not present

## 2021-03-23 DIAGNOSIS — E871 Hypo-osmolality and hyponatremia: Secondary | ICD-10-CM | POA: Diagnosis present

## 2021-03-23 DIAGNOSIS — E1165 Type 2 diabetes mellitus with hyperglycemia: Secondary | ICD-10-CM | POA: Diagnosis present

## 2021-03-23 DIAGNOSIS — R911 Solitary pulmonary nodule: Secondary | ICD-10-CM | POA: Diagnosis present

## 2021-03-23 DIAGNOSIS — E1142 Type 2 diabetes mellitus with diabetic polyneuropathy: Secondary | ICD-10-CM | POA: Diagnosis present

## 2021-03-23 DIAGNOSIS — G8929 Other chronic pain: Secondary | ICD-10-CM | POA: Diagnosis present

## 2021-03-23 DIAGNOSIS — A419 Sepsis, unspecified organism: Secondary | ICD-10-CM

## 2021-03-23 DIAGNOSIS — Z7982 Long term (current) use of aspirin: Secondary | ICD-10-CM

## 2021-03-23 DIAGNOSIS — I472 Ventricular tachycardia: Secondary | ICD-10-CM | POA: Diagnosis not present

## 2021-03-23 DIAGNOSIS — L304 Erythema intertrigo: Secondary | ICD-10-CM | POA: Diagnosis present

## 2021-03-23 DIAGNOSIS — I4819 Other persistent atrial fibrillation: Secondary | ICD-10-CM | POA: Diagnosis present

## 2021-03-23 DIAGNOSIS — R531 Weakness: Secondary | ICD-10-CM | POA: Diagnosis not present

## 2021-03-23 DIAGNOSIS — K219 Gastro-esophageal reflux disease without esophagitis: Secondary | ICD-10-CM | POA: Diagnosis present

## 2021-03-23 DIAGNOSIS — T380X5A Adverse effect of glucocorticoids and synthetic analogues, initial encounter: Secondary | ICD-10-CM | POA: Diagnosis not present

## 2021-03-23 DIAGNOSIS — E872 Acidosis: Secondary | ICD-10-CM | POA: Diagnosis present

## 2021-03-23 DIAGNOSIS — E669 Obesity, unspecified: Secondary | ICD-10-CM | POA: Diagnosis present

## 2021-03-23 DIAGNOSIS — Z7901 Long term (current) use of anticoagulants: Secondary | ICD-10-CM

## 2021-03-23 DIAGNOSIS — Z95818 Presence of other cardiac implants and grafts: Secondary | ICD-10-CM

## 2021-03-23 DIAGNOSIS — R5381 Other malaise: Secondary | ICD-10-CM | POA: Diagnosis not present

## 2021-03-23 DIAGNOSIS — R6521 Severe sepsis with septic shock: Secondary | ICD-10-CM | POA: Diagnosis not present

## 2021-03-23 DIAGNOSIS — I272 Pulmonary hypertension, unspecified: Secondary | ICD-10-CM | POA: Diagnosis present

## 2021-03-23 DIAGNOSIS — I252 Old myocardial infarction: Secondary | ICD-10-CM

## 2021-03-23 DIAGNOSIS — E1151 Type 2 diabetes mellitus with diabetic peripheral angiopathy without gangrene: Secondary | ICD-10-CM | POA: Diagnosis present

## 2021-03-23 DIAGNOSIS — I442 Atrioventricular block, complete: Secondary | ICD-10-CM | POA: Diagnosis present

## 2021-03-23 DIAGNOSIS — I130 Hypertensive heart and chronic kidney disease with heart failure and stage 1 through stage 4 chronic kidney disease, or unspecified chronic kidney disease: Secondary | ICD-10-CM | POA: Diagnosis present

## 2021-03-23 DIAGNOSIS — R0603 Acute respiratory distress: Secondary | ICD-10-CM | POA: Diagnosis present

## 2021-03-23 DIAGNOSIS — Z89431 Acquired absence of right foot: Secondary | ICD-10-CM

## 2021-03-23 DIAGNOSIS — K439 Ventral hernia without obstruction or gangrene: Secondary | ICD-10-CM | POA: Diagnosis present

## 2021-03-23 DIAGNOSIS — E875 Hyperkalemia: Secondary | ICD-10-CM | POA: Diagnosis not present

## 2021-03-23 DIAGNOSIS — I4510 Unspecified right bundle-branch block: Secondary | ICD-10-CM | POA: Diagnosis present

## 2021-03-23 HISTORY — PX: TEMPORARY PACEMAKER: CATH118268

## 2021-03-23 LAB — RESP PANEL BY RT-PCR (FLU A&B, COVID) ARPGX2
Influenza A by PCR: NEGATIVE
Influenza B by PCR: NEGATIVE
SARS Coronavirus 2 by RT PCR: POSITIVE — AB

## 2021-03-23 LAB — COMPREHENSIVE METABOLIC PANEL
ALT: 20 U/L (ref 0–44)
ALT: 22 U/L (ref 0–44)
AST: 41 U/L (ref 15–41)
AST: 47 U/L — ABNORMAL HIGH (ref 15–41)
Albumin: 2.5 g/dL — ABNORMAL LOW (ref 3.5–5.0)
Albumin: 2.1 g/dL — ABNORMAL LOW (ref 3.5–5.0)
Alkaline Phosphatase: 83 U/L (ref 38–126)
Alkaline Phosphatase: 69 U/L (ref 38–126)
Anion gap: 16 — ABNORMAL HIGH (ref 5–15)
Anion gap: 15 (ref 5–15)
BUN: 67 mg/dL — ABNORMAL HIGH (ref 8–23)
BUN: 70 mg/dL — ABNORMAL HIGH (ref 8–23)
CO2: 16 mmol/L — ABNORMAL LOW (ref 22–32)
CO2: 16 mmol/L — ABNORMAL LOW (ref 22–32)
Calcium: 8.5 mg/dL — ABNORMAL LOW (ref 8.9–10.3)
Calcium: 8.3 mg/dL — ABNORMAL LOW (ref 8.9–10.3)
Chloride: 97 mmol/L — ABNORMAL LOW (ref 98–111)
Chloride: 104 mmol/L (ref 98–111)
Creatinine, Ser: 2.45 mg/dL — ABNORMAL HIGH (ref 0.61–1.24)
Creatinine, Ser: 2.69 mg/dL — ABNORMAL HIGH (ref 0.61–1.24)
GFR, Estimated: 28 mL/min — ABNORMAL LOW (ref >60)
GFR, Estimated: 25 mL/min — ABNORMAL LOW (ref >60)
Glucose, Bld: 366 mg/dL — ABNORMAL HIGH (ref 70–99)
Glucose, Bld: 198 mg/dL — ABNORMAL HIGH (ref 70–99)
Potassium: 4.6 mmol/L (ref 3.5–5.1)
Potassium: 3.5 mmol/L (ref 3.5–5.1)
Sodium: 129 mmol/L — ABNORMAL LOW (ref 135–145)
Sodium: 135 mmol/L (ref 135–145)
Total Bilirubin: 1 mg/dL (ref 0.3–1.2)
Total Bilirubin: 0.6 mg/dL (ref 0.3–1.2)
Total Protein: 6.9 g/dL (ref 6.5–8.1)
Total Protein: 6.1 g/dL — ABNORMAL LOW (ref 6.5–8.1)

## 2021-03-23 LAB — CBC WITH DIFFERENTIAL/PLATELET
Abs Immature Granulocytes: 0.34 10*3/uL — ABNORMAL HIGH (ref 0.00–0.07)
Basophils Absolute: 0 10*3/uL (ref 0.0–0.1)
Basophils Relative: 0 %
Eosinophils Absolute: 0 10*3/uL (ref 0.0–0.5)
Eosinophils Relative: 0 %
HCT: 35.4 % — ABNORMAL LOW (ref 39.0–52.0)
Hemoglobin: 11.3 g/dL — ABNORMAL LOW (ref 13.0–17.0)
Immature Granulocytes: 2 %
Lymphocytes Relative: 9 %
Lymphs Abs: 1.5 10*3/uL (ref 0.7–4.0)
MCH: 30.5 pg (ref 26.0–34.0)
MCHC: 31.9 g/dL (ref 30.0–36.0)
MCV: 95.7 fL (ref 80.0–100.0)
Monocytes Absolute: 0.6 10*3/uL (ref 0.1–1.0)
Monocytes Relative: 4 %
Neutro Abs: 14.1 10*3/uL — ABNORMAL HIGH (ref 1.7–7.7)
Neutrophils Relative %: 85 %
Platelets: 345 10*3/uL (ref 150–400)
RBC: 3.7 MIL/uL — ABNORMAL LOW (ref 4.22–5.81)
RDW: 16.4 % — ABNORMAL HIGH (ref 11.5–15.5)
WBC: 16.6 10*3/uL — ABNORMAL HIGH (ref 4.0–10.5)
nRBC: 0 % (ref 0.0–0.2)

## 2021-03-23 LAB — I-STAT CHEM 8, ED
BUN: 61 mg/dL — ABNORMAL HIGH (ref 8–23)
Calcium, Ion: 0.91 mmol/L — ABNORMAL LOW (ref 1.15–1.40)
Chloride: 102 mmol/L (ref 98–111)
Creatinine, Ser: 2.5 mg/dL — ABNORMAL HIGH (ref 0.61–1.24)
Glucose, Bld: 361 mg/dL — ABNORMAL HIGH (ref 70–99)
HCT: 33 % — ABNORMAL LOW (ref 39.0–52.0)
Hemoglobin: 11.2 g/dL — ABNORMAL LOW (ref 13.0–17.0)
Potassium: 4.5 mmol/L (ref 3.5–5.1)
Sodium: 130 mmol/L — ABNORMAL LOW (ref 135–145)
TCO2: 16 mmol/L — ABNORMAL LOW (ref 22–32)

## 2021-03-23 LAB — BLOOD GAS, VENOUS
Acid-base deficit: 8.2 mmol/L — ABNORMAL HIGH (ref 0.0–2.0)
Bicarbonate: 17.9 mmol/L — ABNORMAL LOW (ref 20.0–28.0)
FIO2: 36
O2 Saturation: 45.3 %
Patient temperature: 37.2
pCO2, Ven: 43.8 mmHg — ABNORMAL LOW (ref 44.0–60.0)
pH, Ven: 7.236 — ABNORMAL LOW (ref 7.250–7.430)
pO2, Ven: 31.6 mmHg — CL (ref 32.0–45.0)

## 2021-03-23 LAB — GLUCOSE, CAPILLARY
Glucose-Capillary: 409 mg/dL — ABNORMAL HIGH (ref 70–99)
Glucose-Capillary: 336 mg/dL — ABNORMAL HIGH (ref 70–99)
Glucose-Capillary: 270 mg/dL — ABNORMAL HIGH (ref 70–99)
Glucose-Capillary: 220 mg/dL — ABNORMAL HIGH (ref 70–99)
Glucose-Capillary: 203 mg/dL — ABNORMAL HIGH (ref 70–99)
Glucose-Capillary: 193 mg/dL — ABNORMAL HIGH (ref 70–99)

## 2021-03-23 LAB — TROPONIN I (HIGH SENSITIVITY)
Troponin I (High Sensitivity): 466 ng/L (ref <18)
Troponin I (High Sensitivity): 701 ng/L (ref <18)

## 2021-03-23 LAB — MAGNESIUM
Magnesium: 2.3 mg/dL (ref 1.7–2.4)
Magnesium: 2.6 mg/dL — ABNORMAL HIGH (ref 1.7–2.4)

## 2021-03-23 LAB — LACTIC ACID, PLASMA
Lactic Acid, Venous: 5.2 mmol/L (ref 0.5–1.9)
Lactic Acid, Venous: 6.7 mmol/L (ref 0.5–1.9)
Lactic Acid, Venous: 5.5 mmol/L (ref 0.5–1.9)

## 2021-03-23 LAB — HIV ANTIBODY (ROUTINE TESTING W REFLEX): HIV Screen 4th Generation wRfx: NONREACTIVE

## 2021-03-23 LAB — HEMOGLOBIN A1C
Hgb A1c MFr Bld: 7 % — ABNORMAL HIGH (ref 4.8–5.6)
Mean Plasma Glucose: 154.2 mg/dL

## 2021-03-23 LAB — ECHOCARDIOGRAM LIMITED
Height: 60 in
Weight: 2960 oz

## 2021-03-23 LAB — MRSA NEXT GEN BY PCR, NASAL: MRSA by PCR Next Gen: DETECTED — AB

## 2021-03-23 SURGERY — TEMPORARY PACEMAKER
Anesthesia: LOCAL

## 2021-03-23 MED ORDER — BARICITINIB 1 MG PO TABS
1.0000 mg | ORAL_TABLET | Freq: Every day | ORAL | Status: DC
Start: 2021-03-23 — End: 2021-03-27
  Administered 2021-03-23 – 2021-03-27 (×5): 1 mg via ORAL
  Filled 2021-03-23 (×5): qty 1

## 2021-03-23 MED ORDER — LACTATED RINGERS IV BOLUS (SEPSIS)
500.0000 mL | Freq: Once | INTRAVENOUS | Status: AC
  Administered 2021-03-23: 500 mL via INTRAVENOUS

## 2021-03-23 MED ORDER — EPINEPHRINE HCL 5 MG/250ML IV SOLN IN NS
0.5000 ug/min | INTRAVENOUS | Status: DC
  Administered 2021-03-23: 0.5 ug/min via INTRAVENOUS
  Filled 2021-03-23: qty 250

## 2021-03-23 MED ORDER — NOREPINEPHRINE 4 MG/250ML-% IV SOLN
0.0000 ug/min | INTRAVENOUS | Status: DC
  Administered 2021-03-24: 6 ug/min via INTRAVENOUS
  Administered 2021-03-24: 8 ug/min via INTRAVENOUS
  Administered 2021-03-25: 5 ug/min via INTRAVENOUS
  Filled 2021-03-23 (×3): qty 250

## 2021-03-23 MED ORDER — DOCUSATE SODIUM 100 MG PO CAPS
100.0000 mg | ORAL_CAPSULE | Freq: Two times a day (BID) | ORAL | Status: DC | PRN
  Administered 2021-04-02: 100 mg via ORAL
  Filled 2021-03-23: qty 1

## 2021-03-23 MED ORDER — SODIUM CHLORIDE 0.9 % IV SOLN
1.0000 g | Freq: Once | INTRAVENOUS | Status: AC
  Administered 2021-03-23: 1 g via INTRAVENOUS
  Filled 2021-03-23: qty 10

## 2021-03-23 MED ORDER — SODIUM CHLORIDE 0.9 % IV SOLN
200.0000 mg | Freq: Once | INTRAVENOUS | Status: AC
  Administered 2021-03-23: 200 mg via INTRAVENOUS
  Filled 2021-03-23: qty 40

## 2021-03-23 MED ORDER — MAGNESIUM SULFATE 2 GM/50ML IV SOLN
2.0000 g | Freq: Once | INTRAVENOUS | Status: AC
  Administered 2021-03-23: 2 g via INTRAVENOUS
  Filled 2021-03-23: qty 50

## 2021-03-23 MED ORDER — ONDANSETRON HCL 4 MG/2ML IJ SOLN
4.0000 mg | Freq: Three times a day (TID) | INTRAMUSCULAR | Status: DC | PRN
  Administered 2021-03-23: 4 mg via INTRAVENOUS
  Filled 2021-03-23: qty 2

## 2021-03-23 MED ORDER — SODIUM CHLORIDE 0.9 % IV SOLN
2.0000 g | INTRAVENOUS | Status: AC
  Administered 2021-03-24 – 2021-03-27 (×4): 2 g via INTRAVENOUS
  Filled 2021-03-23 (×4): qty 20

## 2021-03-23 MED ORDER — ATROPINE SULFATE 1 MG/10ML IJ SOSY
1.0000 mg | PREFILLED_SYRINGE | Freq: Once | INTRAMUSCULAR | Status: AC
  Administered 2021-03-23: 1 mg via INTRAVENOUS

## 2021-03-23 MED ORDER — GLUCAGON HCL RDNA (DIAGNOSTIC) 1 MG IJ SOLR
5.0000 mg | Freq: Once | INTRAMUSCULAR | Status: AC
  Administered 2021-03-23: 5 mg via INTRAVENOUS

## 2021-03-23 MED ORDER — BARICITINIB 2 MG PO TABS
2.0000 mg | ORAL_TABLET | Freq: Every day | ORAL | Status: DC
  Filled 2021-03-23: qty 1

## 2021-03-23 MED ORDER — CHLORHEXIDINE GLUCONATE CLOTH 2 % EX PADS
6.0000 | MEDICATED_PAD | Freq: Every day | CUTANEOUS | Status: DC
  Administered 2021-03-24 – 2021-04-01 (×9): 6 via TOPICAL

## 2021-03-23 MED ORDER — ZINC SULFATE 220 (50 ZN) MG PO CAPS
220.0000 mg | ORAL_CAPSULE | Freq: Every day | ORAL | Status: DC
  Administered 2021-03-23 – 2021-03-29 (×7): 220 mg via ORAL
  Filled 2021-03-23 (×7): qty 1

## 2021-03-23 MED ORDER — SODIUM CHLORIDE 0.9 % IV SOLN
500.0000 mg | Freq: Once | INTRAVENOUS | Status: AC
  Administered 2021-03-23: 500 mg via INTRAVENOUS
  Filled 2021-03-23: qty 500

## 2021-03-23 MED ORDER — DEXAMETHASONE SODIUM PHOSPHATE 10 MG/ML IJ SOLN
6.0000 mg | INTRAMUSCULAR | Status: DC
  Administered 2021-03-23 – 2021-03-29 (×6): 6 mg via INTRAVENOUS
  Filled 2021-03-23 (×8): qty 0.6

## 2021-03-23 MED ORDER — LACTATED RINGERS IV BOLUS
500.0000 mL | Freq: Once | INTRAVENOUS | Status: AC
  Administered 2021-03-23: 500 mL via INTRAVENOUS

## 2021-03-23 MED ORDER — LIDOCAINE HCL (PF) 1 % IJ SOLN
INTRAMUSCULAR | Status: DC | PRN
  Administered 2021-03-23: 15 mL

## 2021-03-23 MED ORDER — LACTATED RINGERS IV SOLN: INTRAVENOUS | Status: DC

## 2021-03-23 MED ORDER — SODIUM CHLORIDE 0.9 % IV SOLN
100.0000 mg | Freq: Every day | INTRAVENOUS | Status: AC
  Administered 2021-03-24 – 2021-03-27 (×4): 100 mg via INTRAVENOUS
  Filled 2021-03-23 (×2): qty 20
  Filled 2021-03-23: qty 100
  Filled 2021-03-23 (×2): qty 20

## 2021-03-23 MED ORDER — INSULIN REGULAR(HUMAN) IN NACL 100-0.9 UT/100ML-% IV SOLN
INTRAVENOUS | Status: DC
  Administered 2021-03-23: 13 [IU]/h via INTRAVENOUS
  Administered 2021-03-23: 7.5 [IU]/h via INTRAVENOUS
  Filled 2021-03-23 (×2): qty 100

## 2021-03-23 MED ORDER — DEXTROSE 50 % IV SOLN
0.0000 mL | INTRAVENOUS | Status: DC | PRN
  Administered 2021-03-27: 50 mL via INTRAVENOUS
  Filled 2021-03-23: qty 50

## 2021-03-23 MED ORDER — SODIUM CHLORIDE 0.9 % IV SOLN
100.0000 mg | Freq: Two times a day (BID) | INTRAVENOUS | Status: DC
  Administered 2021-03-23 – 2021-03-25 (×5): 100 mg via INTRAVENOUS
  Filled 2021-03-23 (×7): qty 100

## 2021-03-23 MED ORDER — DEXTROSE IN LACTATED RINGERS 5 % IV SOLN: INTRAVENOUS | Status: DC

## 2021-03-23 MED ORDER — NOREPINEPHRINE 4 MG/250ML-% IV SOLN
INTRAVENOUS | Status: AC
  Administered 2021-03-23: 2 ug/min via INTRAVENOUS
  Filled 2021-03-23: qty 250

## 2021-03-23 MED ORDER — SODIUM CHLORIDE 0.9 % IV SOLN: 500.0000 mg | INTRAVENOUS | Status: DC

## 2021-03-23 MED ORDER — LIDOCAINE HCL (PF) 1 % IJ SOLN
INTRAMUSCULAR | Status: AC
  Filled 2021-03-23: qty 30

## 2021-03-23 MED ORDER — POLYETHYLENE GLYCOL 3350 17 G PO PACK
17.0000 g | PACK | Freq: Every day | ORAL | Status: DC | PRN
  Administered 2021-04-02: 17 g via ORAL
  Filled 2021-03-23: qty 1

## 2021-03-23 MED ORDER — HEPARIN (PORCINE) IN NACL 1000-0.9 UT/500ML-% IV SOLN
INTRAVENOUS | Status: DC | PRN
  Administered 2021-03-23: 500 mL

## 2021-03-23 MED ORDER — SODIUM CHLORIDE 0.9 % IV SOLN: 2.0000 g | INTRAVENOUS | Status: DC

## 2021-03-23 MED ORDER — ASCORBIC ACID 500 MG PO TABS
500.0000 mg | ORAL_TABLET | Freq: Every day | ORAL | Status: DC
  Administered 2021-03-23 – 2021-03-29 (×6): 500 mg via ORAL
  Filled 2021-03-23 (×5): qty 1

## 2021-03-23 SURGICAL SUPPLY — 5 items
CABLE ADAPT PACING TEMP 12FT (ADAPTER) ×1 IMPLANT
CATH S G BIP PACING (CATHETERS) ×1 IMPLANT
SHEATH PINNACLE 6F 10CM (SHEATH) ×1 IMPLANT
SHEATH PROBE COVER 6X72 (BAG) ×1 IMPLANT
SLEEVE REPOSITIONING LENGTH 30 (MISCELLANEOUS) ×1 IMPLANT

## 2021-03-23 NOTE — Sepsis Progress Note (Signed)
Sepsis protocol followed by eLink 

## 2021-03-23 NOTE — Consult Note (Addendum)
Cardiology Consultation:   Patient ID: [name] MRN: [id number]; DOB: 14-Mar-1956  Admit date: 03/23/2021 Date of Consult: 03/23/2021  PCP:  Janifer Adie, MD   [facility] Providers Cardiologist:  Mertie Moores, MD EP_Dr. Lovena Le    Patient Profile:   [name] is a 65 y.o. male with a hx of some degree of developmental delay, cared for by his sister, CAD (CABG 2017), chronic CHF (diastolic), PVD (s/p LCEA 1941 > non-obstructive carotid disease since, follows with [facility]), h/o partial foot amputations 2/2 diabetic ulcer (2014),  COPD (home O2), prior stroke, DM, HTN, HLD, AFib, large abd hernia/mass who is being seen 03/23/2021 for the evaluation of tachy-brady, symptomatic pauses at the request of Dr. Irish Lack.  History of Present Illness:   Mr. Frisbie back in 2019 was admitted with the flu, during that stay he was in/out of AF with long pauses and had a temp wire, this is when he was tsrated on amiodarone, and no PPM was felt needed maintaining SR. He last saw Dr. Lovena Le 03/27/20, at that time holding SR, chronically on amiodarone, with known sinus node dysfunction without symptomatic pauses and not felt to have PPM indication att hat time.  The patient only yesterday or today reportedly diagnosed with pneumonia, and today while [facility] he had a syncopal spell, EMS was called and by record observed to have a period of asystole associated with clonic type jerking and did have brief CPR, no meds or IV in route. On arrival to the ER had a number of recurrent epiosodes of asystole and had more CPR, in communication with the sister who requested full code and care, he was given atropine then started on epi gtt ER makes mention that [facility] notes reported the patient being on TOPROL XL (not listed on his med list.  Cardiology was called to consult In d/w Dr. Irish Lack, he wonders if the patient is a paer candidate with skin breakdowns, wounds and high  infection risk, he thought perhaps palliative consult for goals of care was most appropriate though currently his caregiver and sister wants full code, full care. HR better on an epi gtt and reported he has asked them to wean down with tachycardia now EP was asked to weigh in on PPM candidacy  In further discussion with Dr. Irish Lack after CCM/attending has had opportunity to speak more with his sister, he is planned to get a temp pacing wire in, hold his toprol, and treat his other acute medical issues.  There is a thought toward palliative if not turned around in the next 48hours  LABS COVID + LACTIC ACID 5.2 > 6.7 K+ 4.6 > 4.5 Mag 2.3 BUN/Creat 67/1.24 > 61/2.50 WBC 16.6 H/H 11/33 Plts 345  HOME meds list is not at bedside/in the cath lab with the patient In d/w cardiology APP who has had opportunity for review, Toprol was noted, not amiodarone  Past Medical History:  Diagnosis Date   Abdominal hernia    Chronic, not a good surgical candidate   Abscess, abdomen 12/31/2010   Referred to Wound Care in 01/2011 because of multiple abd abscess with VERY large ventral hernia (please look at image of CT abd/pelvis 09/2010).  Because of hernia I was hesitant to I&D.       Anemia    History of Iron Def Anemia   Anxiety    AVM (arteriovenous malformation)    chronic GI blood loss   Carotid artery disease (HCC)  s/p CEA   Chronic diastolic CHF (congestive heart failure) (HCC)    takes Lasix   Chronic low back pain    COPD (chronic obstructive pulmonary disease) (HCC)    CVA (cerebral infarction) 09/2010   Bilateral with Left > Right   Diabetes mellitus    GERD (gastroesophageal reflux disease)    History of nuclear stress test    Myoview 9/16:  Inferior, apical and inf-lateral ischemia; not gated; High Risk   Hx of echocardiogram    Echo 5/16:  Mild LVH, EF 55%, indeterm. diast function, WMA could not be ruled out, MAC, trivial MR, mild LAE, normal RVF //  b. Echo 4/17: EF 55-60%, no  RWMA, Gr 2 DD, Ao sclerosis, MAC, mild MS, mild LAE, PASP 55 mHg   Hyperlipidemia    Hypertension    Intellectual disability    Sister helps to take care of him and takes him to appts   Itching    all over body; pt scratches and has sores on bilateral arms and abdomen   Lung nodule    Myocardial infarction [facility] 2016 ?   Heart attack  (  Per  pt. )   Obesity    Oxygen dependent    wears 2 liters at bedtime and when needed   PAF (paroxysmal atrial fibrillation) ([place]) 06/2009   CHADS score 2 (HTN, DM) // Pradaxa for Afib // Pradaxa stopped due to worsening anemia (Hgb in 5/18 8.5 >> Pradaxa remains on hold)    Pneumonia    hx of   Stroke ([place])    Tobacco user    Smokes 1ppd for multiple years.  Quit after hosp 09/2010.   Tubular adenoma of colon    Wears glasses     Past Surgical History:  Procedure Laterality Date   AMPUTATION Right 03/29/2013   Procedure: AMPUTATION RAY;  Surgeon: Newt Minion, MD;  Location: [place];  Service: Orthopedics;  Laterality: Right;  Right Foot 3rd and Possible 4th Ray Amputation   AMPUTATION Right 04/23/2013   Procedure: AMPUTATION RIGHT MID-FOOT;  Surgeon: Newt Minion, MD;  Location: [place];  Service: Orthopedics;  Laterality: Right;   arm surgery Left    as a child   CARDIAC CATHETERIZATION N/A 04/30/2015   Procedure: Left Heart Cath and Coronary Angiography;  Surgeon: Belva Crome, MD;  Location: [place] [facility];  Service: Cardiovascular;  Laterality: N/A;   Carotid arteriogram  10/2010   30% right ICA stenosis, 40% left ICA stenosis    CAROTID ENDARTERECTOMY Left 08-13-15   CEA   CATARACT EXTRACTION, BILATERAL     COLONOSCOPY WITH PROPOFOL N/A 06/10/2014   Procedure: COLONOSCOPY WITH PROPOFOL;  Surgeon: Jerene Bears, MD;  Location: [facility];  Service: Gastroenterology;  Laterality: N/A;   CORONARY ARTERY BYPASS GRAFT N/A 08/13/2015   Procedure: CORONARY ARTERY BYPASS GRAFTING (CABG), ON PUMP, TIMES THREE, USING LEFT INTERNAL MAMMARY ARTERY,  RIGHT GREATER SAPHENOUS VEIN HARVESTED ENDOSCOPICALLY;  Surgeon: [name], MD;  Location: [place];  Service: Open Heart Surgery;  Laterality: N/A;   DEBRIDMENT OF DECUBITUS ULCER Right 02/13/2013   ENDARTERECTOMY Left 08/13/2015   Procedure: ENDARTERECTOMY CAROTID;  Surgeon: Angelia Mould, MD;  Location: [place];  Service: Vascular;  Laterality: Left;   ESOPHAGOGASTRODUODENOSCOPY (EGD) WITH PROPOFOL N/A 06/10/2014   Procedure: ESOPHAGOGASTRODUODENOSCOPY (EGD) WITH PROPOFOL;  Surgeon: Jerene Bears, MD;  Location: [facility];  Service: Gastroenterology;  Laterality: N/A;   GIVENS CAPSULE STUDY N/A 07/09/2014  Procedure: GIVENS CAPSULE STUDY;  Surgeon: Jerene Bears, MD;  Location: [facility];  Service: Gastroenterology;  Laterality: N/A;   I & D EXTREMITY Right 02/13/2013   Procedure: IRRIGATION AND DEBRIDEMENT FOOT ULCER;  Surgeon: Johnny Bridge, MD;  Location: [place];  Service: Orthopedics;  Laterality: Right;  PULSE LAVAGE   INSERTION OF DIALYSIS CATHETER N/A 10/02/2017   Procedure: INSERTION OF TUNNELED DIALYSIS CATHETER;  Surgeon: Elam Dutch, MD;  Location[facility];  Service: Vascular;  Laterality: N/A;   IR REMOVAL TUN CV CATH W/O FL  10/25/2017   MULTIPLE TOOTH EXTRACTIONS     TEE WITHOUT CARDIOVERSION N/A 08/13/2015   Procedure: TRANSESOPHAGEAL ECHOCARDIOGRAM (TEE);  Surgeon: [name], MD;  Location: [place];  Service: Open Heart Surgery;  Laterality: N/A;   TEMPORARY PACEMAKER N/A 09/15/2017   Procedure: TEMPORARY PACEMAKER;  Surgeon: Jettie Booze, MD;  Location: [place] [facility];  Service: Cardiovascular;  Laterality: N/A;   TRANSESOPHAGEAL ECHOCARDIOGRAM  09/2010   No ASD or PFO. EF 60-65%.  Normal systolic function. No evidence of thrombus.    TRANSTHORACIC ECHOCARDIOGRAM  09/2010    The cavity size was normal. Systolic function was vigorous.  EF 65-70%.  Normal wall funciton.    ULTRASOUND GUIDANCE FOR VASCULAR ACCESS  09/15/2017   Procedure: Ultrasound Guidance  For Vascular Access;  Surgeon: Jettie Booze, MD;  Location: [place] [facility];  Service: Cardiovascular;;     Home Medications:  Prior to Admission medications   Medication Sig Start Date End Date Taking? Authorizing Provider  acetaminophen (TYLENOL) 325 MG tablet Take 650 mg by mouth 3 (three) times daily as needed for mild pain.   Yes [provider]  apixaban (ELIQUIS) 2.5 MG TABS tablet Take 2.5 mg by mouth 2 (two) times daily.   Yes [provider]  aspirin 81 MG EC tablet Take 81 mg by mouth daily.   Yes [provider]  atorvastatin (LIPITOR) 80 MG tablet Take 1 tablet (80 mg total) by mouth daily. 10/26/17  Yes Colbert Ewing, MD  calcium carbonate (TUMS - DOSED IN MG ELEMENTAL CALCIUM) 500 MG chewable tablet Chew 1 tablet by mouth 3 (three) times daily as needed for indigestion or heartburn.   Yes [provider]  Cholecalciferol (VITAMIN D3) 50 MCG (2000 UT) TABS Take 50 mcg by mouth daily.   Yes [provider]  Darbepoetin Alfa (ARANESP) 150 MCG/0.3ML SOSY injection Inject 0.3 mLs (150 mcg total) into the skin every Wednesday at 6 PM. Patient taking differently: Inject 150 mcg into the skin See admin instructions. Every 8 weeks 11/01/17  Yes Colbert Ewing, MD  furosemide (LASIX) 80 MG tablet Take 1 tablet (80 mg total) by mouth 2 (two) times daily as needed for fluid. 10/26/17 05/15/21 Yes Colbert Ewing, MD  HYDROcodone-acetaminophen (NORCO/VICODIN) 5-325 MG tablet Take 1 tablet by mouth daily.   Yes [provider]  insulin glargine (LANTUS) 100 UNIT/ML injection Inject 0.14 mLs (14 Units total) into the skin at bedtime. Patient taking differently: Inject 26 Units into the skin daily. 10/26/17  Yes Colbert Ewing, MD  ipratropium-albuterol (DUONEB) 0.5-2.5 (3) MG/3ML SOLN Take 3 mLs by nebulization 2 (two) times daily as needed (COPD).   Yes [provider]  metoprolol succinate (TOPROL-XL) 25 MG 24 hr tablet  Take 25 mg by mouth daily.   Yes [provider]  Multiple Vitamin (MULTIVITAMIN) capsule Take 1 capsule by mouth daily.   Yes [provider]  nitroGLYCERIN (NITROSTAT) 0.4 MG SL tablet Place 0.4 mg under the tongue every 5 (five) minutes as needed for chest pain.   Yes [provider]  nystatin (MYCOSTATIN/NYSTOP) powder Apply topically 4 (four) times daily. Apply to inguinal area/scrotum and under pannus Patient taking differently: Apply 1 application topically in the morning, at noon, and at bedtime. To skin folds to treat intertrigo 03/02/18  Yes Ardis Hughs, MD  OXYGEN Inhale 2.5 L into the lungs See admin instructions. As needed and at bedtime for shortness of breath   Yes [provider]  pantoprazole (PROTONIX) 40 MG tablet Take 40 mg by mouth daily.   Yes [provider]  PARoxetine (PAXIL) 40 MG tablet Take 40 mg by mouth daily.    Yes [provider]  polyvinyl alcohol (LIQUIFILM TEARS) 1.4 % ophthalmic solution Place 1 drop into both eyes 2 (two) times daily as needed for dry eyes. Natural Tears   Yes [provider]  senna-docusate (SENOKOT-S) 8.6-50 MG tablet Take 1 tablet by mouth at bedtime as needed for mild constipation. 10/26/17  Yes Colbert Ewing, MD  tiotropium (SPIRIVA) 18 MCG inhalation capsule Place 18 mcg into inhaler and inhale daily.   Yes [provider]  triamcinolone cream (KENALOG) 0.1 % Apply 1 application topically 2 (two) times daily as needed (legs and other itchy areas).   Yes [provider]  amiodarone (PACERONE) 200 MG tablet Take one tablet by mouth daily Monday through Saturday. Do NOT take on Sunday. Patient not taking: Reported on 03/23/2021 06/11/19   Evans Lance, MD  multivitamin (RENA-VIT) TABS tablet Take 1 tablet by mouth at bedtime. Patient not taking: Reported on 03/23/2021 10/26/17   Colbert Ewing, MD  predniSONE (DELTASONE) 10 MG tablet Take 10-40 mg by mouth  See admin instructions. 42m daily for 3 days, then 275mdaily for 3 days, then 1074maily for 3 days. Then stop.    [provider]  risperiDONE (RISPERDAL) 0.5 MG tablet Take 0.5 mg by mouth 2 (two) times daily. Patient not taking: Reported on 03/23/2021    [provider]  traMADol (ULTRAM) 50 MG tablet Take 1-2 tablets (50-100 mg total) by mouth every 6 (six) hours as needed for moderate pain. Patient not taking: Reported on 03/23/2021 03/02/18   HerArdis HughsD    Inpatient Medications: Scheduled Meds:  Continuous Infusions:  azithromycin 500 mg (03/23/21 1222)   azithromycin     cefTRIAXone (ROCEPHIN)  IV 1 g (03/23/21 1225)   cefTRIAXone (ROCEPHIN)  IV     epinephrine 4 mcg/min (03/23/21 1205)   lactated ringers 500 mL (03/23/21 1216)   lactated ringers     PRN Meds:   Allergies:    Allergies  Allergen Reactions   Penicillins Hives, Nausea And Vomiting, Swelling and Other (See Comments)    Tolerated Cefepime Has patient had a PCN reaction causing immediate rash, facial/tongue/throat swelling, SOB or lightheadedness with hypotension: YES Has patient had a PCN reaction causing severe rash involving mucus membranes or skin necrosis: No Has patient had a PCN reaction that required hospitalization No Has patient had a PCN reaction occurring within the last 10 years: No If all of the above answers are "NO", then may proceed with Cephalosporin use.     Social History:   Social History   Socioeconomic History   Marital status: Single    Spouse name: Not on file   Number of children: 0   Years of education: Not on  file   Highest education level: Not on file  Occupational History    Employer: DISABLED  Tobacco Use   Smoking status: Former    Packs/day: 1.00    Years: 42.00    Pack years: 42.00    Types: Cigarettes, Pipe    Quit date: 11/10/2010    Years since quitting: 10.3   Smokeless tobacco: Former    Quit date: 10/02/2010  Vaping Use    Vaping Use: Never used  Substance and Sexual Activity   Alcohol use: No    Alcohol/week: 0.0 standard drinks    Comment: " last drink was 2003" 08/11/15   Drug use: No   Sexual activity: Not on file  Other Topics Concern   Not on file  Social History Narrative   Released from prison in 2003 after serving 2 years for indecency with a minor.    He smokes cigarettes greater than 30 years, also smoked a pipe.  Quit tobacco 09/2010.   Denies EtOH and drugs.    Social Determinants of Health   Financial Resource Strain: Not on file  Food Insecurity: Not on file  Transportation Needs: Not on file  Physical Activity: Not on file  Stress: Not on file  Social Connections: Not on file  Intimate Partner Violence: Not on file    Family History:   Family History  Problem Relation Age of Onset   Heart disease Mother    Hypertension Sister    Heart disease Brother    Heart disease Father    Peripheral vascular disease Father    Heart disease Maternal Grandmother    Heart attack Maternal Grandmother    Breast cancer Maternal Grandmother    Stomach cancer Maternal Uncle    Colon cancer Neg Hx    Stroke Neg Hx      ROS:  Please see the history of present illness.  All other ROS reviewed and negative.     Physical Exam/Data:   Vitals:   03/23/21 1200 03/23/21 1204 03/23/21 1207 03/23/21 1230  BP: (!) 124/96 119/60 (!) 131/58 (!) 109/50  Pulse: 74   89  Resp: 17 (!) 39 (!) 22 (!) 28  Temp: 98.1 F (36.7 C) 98.2 F (36.8 C) 98.2 F (36.8 C) 98.3 F (36.8 C)  TempSrc:      SpO2: 97%  100% 100%  Weight:      Height:       No intake or output data in the 24 hours ending 03/23/21 1250 Last 3 Weights 03/23/2021 09/23/2020 03/27/2020  Weight (lbs) 185 lb 178 lb 194 lb  Weight (kg) 83.915 kg 80.74 kg 87.998 kg     Body mass index is 36.13 kg/m.  Physical Exam Constitutional:      Comments: He appears chronically ill  HENT:     Head: Normocephalic and atraumatic.  Neck:      Vascular: No carotid bruit.  Cardiovascular:     Rate and Rhythm: Rhythm irregular.     Heart sounds: No murmur heard. Pulmonary:     Effort: Pulmonary effort is normal. No respiratory distress.     Breath sounds: No wheezing, rhonchi or rales.  Chest:     Chest wall: Tenderness present.  Abdominal:     Palpations: Abdomen is soft.     Comments: He has a massive growth that is mostly R sided though encompasses much of his abdomen, with a 3-4cm circular area of skin tear and a muber of smaller skin lesions  Musculoskeletal:  General: Deformity present.     Comments: Partial R foot amputation  Skin:    General: Skin is warm and dry.     Comments: Some small scattered areas of superficial skin tears/wounds, scattered b/l E bruising  Neurological:     Mental Status: He is alert.     Comments: He is pleasant and cooperative, known to have baseline congnitive delay  Psychiatric:        Behavior: Behavior is cooperative.     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 Wall thickness was    increased in a pattern of mild LVH. Indeterminant diastolic    function, atrial fibrillation. Systolic function was mildly to    moderately reduced. The estimated ejection fraction was in the    range of 40% to 45%. Diffuse hypokinesis.  - Aortic valve: There was no stenosis.  - Mitral valve: Moderately calcified annulus. There was no    significant regurgitation.  - Left atrium: The atrium was mildly dilated.  - Right ventricle: The cavity size was mildly to moderately    dilated. Pacer wire or  catheter noted in right ventricle.    Systolic function was mildly reduced.  - Right atrium: The atrium was mildly dilated.  - Tricuspid valve: Peak RV-RA gradient (S): 32 mm Hg.  - Pulmonary arteries: PA peak pressure: 35 mm Hg (S).  - Inferior vena cava: The vessel was normal in size. The    respirophasic diameter changes were in the normal range (>= 50%),    consistent with normal central venous pressure.   Impressions:  - The patient was in atrial fibrillation. Normal LV size with mild    LV hypertrophy. EF 40-45%. Mild-moderate RV dilation with mildly    decreased systolic function. Borderline pulmonary hypertension.    Laboratory Data:  High Sensitivity Troponin:   Recent Labs  Lab 03/23/21 1029  TROPONINIHS 466*     Chemistry Recent Labs  Lab 03/23/21 1029 03/23/21 1034  NA 129* 130*  K 4.6 4.5  CL 97* 102  CO2 16*  --   GLUCOSE 366* 361*  BUN 67* 61*  CREATININE 2.45* 2.50*  CALCIUM 8.5*  --   GFRNONAA 28*  --   ANIONGAP 16*  --     Recent Labs  Lab 03/23/21 1029  PROT 6.9  ALBUMIN 2.5*  AST 41  ALT 20  ALKPHOS 83  BILITOT 1.0   Hematology Recent Labs  Lab 03/23/21 1029 03/23/21 1034  WBC 16.6*  --   RBC 3.70*  --   HGB 11.3* 11.2*  HCT 35.4* 33.0*  MCV 95.7  --   MCH 30.5  --   MCHC 31.9  --   RDW 16.4*  --   PLT 345  --    BNPNo results for input(s): BNP, PROBNP in the last 168 hours.  DDimer No results for input(s): DDIMER in the last 168 hours.   Radiology/Studies:  DG Chest 2 View Result Date: 03/22/2021 CLINICAL DATA:  Decreased breath sounds at right lung base. Chest pain with nonproductive cough for 2 weeks. EXAM: CHEST -  2 VIEW COMPARISON:  Radiograph 06/30/2020. FINDINGS: Similar low lung volumes. Patient is post median sternotomy. Similar cardiomegaly with aortic atherosclerosis and tortuosity there is patchy airspace disease at the right lung base, new from prior exam. Background peribronchial and interstitial thickening is  similar. Minor subsegmental atelectasis at the left lung base. Suspected small right pleural effusion. No pneumothorax. Scoliotic curvature of the spine. IMPRESSION: 1. Patchy airspace disease at the right lung base, suspicious for pneumonia. Followup PA and lateral chest X-ray is recommended in 3-4 weeks following trial of antibiotic therapy to ensure resolution and exclude underlying malignancy. 2. Suspected small right pleural effusion. 3. Stable cardiomegaly. Stable mild diffuse interstitial and bronchial thickening suggesting chronic lung disease, possible COPD. Electronically Signed   By: Keith Rake M.D.   On: 03/22/2021 23:51   DG Chest Portable 1 View Result Date: 03/23/2021 CLINICAL DATA:  Status post CPR. EXAM: PORTABLE CHEST 1 VIEW COMPARISON:  Chest x-ray from yesterday. FINDINGS: Unchanged mild cardiomegaly status post CABG. Increasing heterogeneous interstitial opacity in the left mid lung. Improved aeration at the right lung base. Unchanged small right pleural effusion. No pneumothorax. No acute osseous abnormality. IMPRESSION: 1. Increasing heterogeneous interstitial opacity in the left mid lung, likely mild asymmetric pulmonary edema. 2. Improved aeration at the right lung base. 3. Unchanged small right pleural effusion. Electronically Signed   By: Titus Dubin M.D.   On: 03/23/2021 10:47     Assessment and Plan:   Syncope CHB with rates 20's and reported recurrent long/symptomatic asystolic events In the cath lab s/p temp wire No telemetry for review now out of the ER  Chart review consult Given he has temp wire placed and stabilized, and there has been discussion of palliative measures pending his clinical course otherwise. Would stop all potential nodal blocking agents and follow his AV conduction off pressor and await further discussions with family and CCM  In regards to candidacy for device, I will await until Dr. Rayann Heman has had opportunity to review and discuss the  case with other providers for the patient. He is at increased infection risk with prior hx of partial foot amputation 2/2 DM ulcer and areas of skin breakdown and wounds, and in d/w Dr. Beau Fanny suspect some degree of fungal infection in groin skin folds, concerns of infection riska dn the patient's ability to follow post procedure restrictions.  He was on a small dose of Toprol 36m daily it seems, will see how his conduction looks though was a small dose with profound bradycardia/asystolic events reported      Risk Assessment/Risk Scores:  {  For questions or updates, please contact [facility] Please consult [email or web address] for contact info under    Signed, RBaldwin Jamaica PA-C  03/23/2021 12:50 PM  I have seen the patient and reviewed the above assessment and plan.  Changes to above are made where necessary.  On exam, very ill appearing.  Sleeping.  He presents with complete heart block, metabolic acidosis, and COVID.  He is very ill.  Previously felt to be very high risk for PPM implant.  At this time, I would advise palliative care consultation for goals of care conversations.  Ep to follow.  Co Sign: JThompson Grayer MD

## 2021-03-23 NOTE — ED Notes (Signed)
Pt placed on Zoll pads and monitor.

## 2021-03-23 NOTE — Consult Note (Addendum)
Cardiology Consultation:   Patient ID: DREWEY BEGUE MRN: [id number]; DOB: [birth year]  Admit date: 03/23/2021 Date of Consult: 03/23/2021  PCP:  Janifer Adie, MD   [facility] Providers Cardiologist:  Mertie Moores, MD   {    Patient Profile:   [name] is a 65 y.o. male with a hx of paroxysmal atrial fibrillation on low-dose Eliquis, tachybradycardia syndrome, chronic systolic and diastolic heart failure, hypertension, hyperlipidemia, developmental delayed, COPD, bilateral carotid stenosis with left carotid enterectomy 2017, ischemic CVAs, type 2 diabetes, tobacco abuse, CKD stage III, chronic anemia, chronic abdominal mass, who is being seen 03/23/2021 for the evaluation of syncope and bradycardia at the request of Dr. Jeanell Sparrow.   History of Present Illness:   Mr. Stoudt follows Dr Lovena Le outpatient for PAF.   He was admitted 09/15/2017 to 10/26/2017 tachybradycardia syndrome, had a temporary PPM placed 09/15/2017, at the time had plans for permanent pacemaker, was told to avoid beta-blocker at DC. He was last seen on 03/27/20 in the office, maintained in NSR on amiodarone, had no further symptomatic pauses from sinus node dysfunction perspective.  It appears that he is on metoprolol XL 8m daily per [facility] med list. Lat Myoview 03/07/16 was low risk with prior MI, no reversible ischemia.   Patient is a poor historian with underlying mental delay.  His niece is at bedside to assist history.  His healthcare proxy is his sister.  Niece states that they have received a phone call today from his doctor stating he is diagnosed with pneumonia.   Per ER provider reports, patient was attending [employer] daycare, witnessed with a syncope episode while on the bus.  EMS was called, witnessed patient passing out again while walking with his walker.  He was noted bradycardic and hypoxic, required brief chest compression with spontaneous recovery of consciousness.  Upon arrival to ED, he had required  >10 times of CPR due to frequent unconsciousness/asystole episodes. He is currently on epinephrine drip with heart rate at 30s.  He is arousable, able to answer yes and no question, cognitively impaired  Cardiology is consulted for further input.   Niece reports that patient has been sick for a long time, goal of care discussion has not really occurred. Patient regularly attends adult day care, ambulate with a walker. PPM was discussed at on point by his cardiologist. He has numerous skin tears chronically. He has a large abdominal tumor that has been there for 40 years. He is cognitively impaired and does not follow directions well at baseline. Niece is interested talking to palliative care.   Patient states his chest wall is hurting from CPR earlier, he reports mild cough, denied fever, chills. He is not able to answer more questions and fall asleep during encounter.       Past Medical History:  Diagnosis Date   Abdominal hernia    Chronic, not a good surgical candidate   Abscess, abdomen 12/31/2010   Referred to Wound Care in 01/2011 because of multiple abd abscess with VERY large ventral hernia (please look at image of CT abd/pelvis 09/2010).  Because of hernia I was hesitant to I&D.       Anemia    History of Iron Def Anemia   Anxiety    AVM (arteriovenous malformation)    chronic GI blood loss   Carotid artery disease (HCC)    s/p CEA   Chronic diastolic CHF (congestive heart failure) (HCC)    takes Lasix   Chronic low back  pain    COPD (chronic obstructive pulmonary disease) (HCC)    CVA (cerebral infarction) 09/2010   Bilateral with Left > Right   Diabetes mellitus    GERD (gastroesophageal reflux disease)    History of nuclear stress test    Myoview 9/16:  Inferior, apical and inf-lateral ischemia; not gated; High Risk   Hx of echocardiogram    Echo 5/16:  Mild LVH, EF 55%, indeterm. diast function, WMA could not be ruled out, MAC, trivial MR, mild LAE, normal RVF //  b. Echo  4/17: EF 55-60%, no RWMA, Gr 2 DD, Ao sclerosis, MAC, mild MS, mild LAE, PASP 55 mHg   Hyperlipidemia    Hypertension    Intellectual disability    Sister helps to take care of him and takes him to appts   Itching    all over body; pt scratches and has sores on bilateral arms and abdomen   Lung nodule    Myocardial infarction [facility]) 2016 ?   Heart attack  (  Per  pt. )   Obesity    Oxygen dependent    wears 2 liters at bedtime and when needed   PAF (paroxysmal atrial fibrillation) ([place]) 06/2009   CHADS score 2 (HTN, DM) // Pradaxa for Afib // Pradaxa stopped due to worsening anemia (Hgb in 5/18 8.5 >> Pradaxa remains on hold)    Pneumonia    hx of   Stroke ([place])    Tobacco user    Smokes 1ppd for multiple years.  Quit after hosp 09/2010.   Tubular adenoma of colon    Wears glasses     Past Surgical History:  Procedure Laterality Date   AMPUTATION Right 03/29/2013   Procedure: AMPUTATION RAY;  Surgeon: Newt Minion, MD;  Location: [place];  Service: Orthopedics;  Laterality: Right;  Right Foot 3rd and Possible 4th Ray Amputation   AMPUTATION Right 04/23/2013   Procedure: AMPUTATION RIGHT MID-FOOT;  Surgeon: Newt Minion, MD;  Location: [place];  Service: Orthopedics;  Laterality: Right;   arm surgery Left    as a child   CARDIAC CATHETERIZATION N/A 04/30/2015   Procedure: Left Heart Cath and Coronary Angiography;  Surgeon: Belva Crome, MD;  Location: [place] [facility];  Service: Cardiovascular;  Laterality: N/A;   Carotid arteriogram  10/2010   30% right ICA stenosis, 40% left ICA stenosis    CAROTID ENDARTERECTOMY Left 08-13-15   CEA   CATARACT EXTRACTION, BILATERAL     COLONOSCOPY WITH PROPOFOL N/A 06/10/2014   Procedure: COLONOSCOPY WITH PROPOFOL;  Surgeon: Jerene Bears, MD;  Location: [facility];  Service: Gastroenterology;  Laterality: N/A;   CORONARY ARTERY BYPASS GRAFT N/A 08/13/2015   Procedure: CORONARY ARTERY BYPASS GRAFTING (CABG), ON PUMP, TIMES THREE, USING LEFT INTERNAL  MAMMARY ARTERY, RIGHT GREATER SAPHENOUS VEIN HARVESTED ENDOSCOPICALLY;  Surgeon: [name], MD;  Location: [place];  Service: Open Heart Surgery;  Laterality: N/A;   DEBRIDMENT OF DECUBITUS ULCER Right 02/13/2013   ENDARTERECTOMY Left 08/13/2015   Procedure: ENDARTERECTOMY CAROTID;  Surgeon: Angelia Mould, MD;  Location: [place];  Service: Vascular;  Laterality: Left;   ESOPHAGOGASTRODUODENOSCOPY (EGD) WITH PROPOFOL N/A 06/10/2014   Procedure: ESOPHAGOGASTRODUODENOSCOPY (EGD) WITH PROPOFOL;  Surgeon: Jerene Bears, MD;  [facility]: [facility];  Service: Gastroenterology;  Laterality: N/A;   GIVENS CAPSULE STUDY N/A 07/09/2014   Procedure: GIVENS CAPSULE STUDY;  Surgeon: Jerene Bears, MD;  Location: [facility];  Service: Gastroenterology;  Laterality: N/A;  I & D EXTREMITY Right 02/13/2013   Procedure: IRRIGATION AND DEBRIDEMENT FOOT ULCER;  Surgeon: Johnny Bridge, MD;  Location: [place];  Service: Orthopedics;  Laterality: Right;  PULSE LAVAGE   INSERTION OF DIALYSIS CATHETER N/A 10/02/2017   Procedure: INSERTION OF TUNNELED DIALYSIS CATHETER;  Surgeon: Elam Dutch, MD;  [facility];  Service: Vascular;  Laterality: N/A;   IR REMOVAL TUN CV CATH W/O FL  10/25/2017   MULTIPLE TOOTH EXTRACTIONS     TEE WITHOUT CARDIOVERSION N/A 08/13/2015   Procedure: TRANSESOPHAGEAL ECHOCARDIOGRAM (TEE);  Surgeon: [name], MD;  Location: [place];  Service: Open Heart Surgery;  Laterality: N/A;   TEMPORARY PACEMAKER N/A 09/15/2017   Procedure: TEMPORARY PACEMAKER;  Surgeon: Jettie Booze, MD;  Location: [place] [facility];  Service: Cardiovascular;  Laterality: N/A;   TRANSESOPHAGEAL ECHOCARDIOGRAM  09/2010   No ASD or PFO. EF 60-65%.  Normal systolic function. No evidence of thrombus.    TRANSTHORACIC ECHOCARDIOGRAM  09/2010    The cavity size was normal. Systolic function was vigorous.  EF 65-70%.  Normal wall funciton.    ULTRASOUND GUIDANCE FOR VASCULAR ACCESS  09/15/2017   Procedure:  Ultrasound Guidance For Vascular Access;  Surgeon: Jettie Booze, MD;  Location: [place] [facility];  Service: Cardiovascular;;     Home Medications:  Prior to Admission medications   Medication Sig Start Date End Date Taking? Authorizing Provider  acetaminophen (TYLENOL) 325 MG tablet Take 650 mg by mouth 3 (three) times daily as needed for mild pain.   Yes [provider]  apixaban (ELIQUIS) 2.5 MG TABS tablet Take 2.5 mg by mouth 2 (two) times daily.   Yes [provider]  aspirin 81 MG EC tablet Take 81 mg by mouth daily.   Yes [provider]  atorvastatin (LIPITOR) 80 MG tablet Take 1 tablet (80 mg total) by mouth daily. 10/26/17  Yes Colbert Ewing, MD  calcium carbonate (TUMS - DOSED IN MG ELEMENTAL CALCIUM) 500 MG chewable tablet Chew 1 tablet by mouth 3 (three) times daily as needed for indigestion or heartburn.   Yes [provider]  Cholecalciferol (VITAMIN D3) 50 MCG (2000 UT) TABS Take 50 mcg by mouth daily.   Yes [provider]  Darbepoetin Alfa (ARANESP) 150 MCG/0.3ML SOSY injection Inject 0.3 mLs (150 mcg total) into the skin every Wednesday at 6 PM. Patient taking differently: Inject 150 mcg into the skin See admin instructions. Every 8 weeks 11/01/17  Yes Colbert Ewing, MD  furosemide (LASIX) 80 MG tablet Take 1 tablet (80 mg total) by mouth 2 (two) times daily as needed for fluid. 10/26/17 05/15/21 Yes Colbert Ewing, MD  HYDROcodone-acetaminophen (NORCO/VICODIN) 5-325 MG tablet Take 1 tablet by mouth daily.   Yes [provider]  insulin glargine (LANTUS) 100 UNIT/ML injection Inject 0.14 mLs (14 Units total) into the skin at bedtime. Patient taking differently: Inject 26 Units into the skin daily. 10/26/17  Yes Colbert Ewing, MD  ipratropium-albuterol (DUONEB) 0.5-2.5 (3) MG/3ML SOLN Take 3 mLs by nebulization 2 (two) times daily as needed (COPD).   Yes [provider]  metoprolol succinate (TOPROL-XL) 25  MG 24 hr tablet Take 25 mg by mouth daily.   Yes [provider]  Multiple Vitamin (MULTIVITAMIN) capsule Take 1 capsule by mouth daily.   Yes [provider]  nitroGLYCERIN (NITROSTAT) 0.4 MG SL tablet Place 0.4 mg under the tongue every 5 (five) minutes as needed for chest pain.  Yes [provider]  nystatin (MYCOSTATIN/NYSTOP) powder Apply topically 4 (four) times daily. Apply to inguinal area/scrotum and under pannus Patient taking differently: Apply 1 application topically in the morning, at noon, and at bedtime. To skin folds to treat intertrigo 03/02/18  Yes Ardis Hughs, MD  OXYGEN Inhale 2.5 L into the lungs See admin instructions. As needed and at bedtime for shortness of breath   Yes [provider]  pantoprazole (PROTONIX) 40 MG tablet Take 40 mg by mouth daily.   Yes [provider]  PARoxetine (PAXIL) 40 MG tablet Take 40 mg by mouth daily.    Yes [provider]  polyvinyl alcohol (LIQUIFILM TEARS) 1.4 % ophthalmic solution Place 1 drop into both eyes 2 (two) times daily as needed for dry eyes. Natural Tears   Yes [provider]  senna-docusate (SENOKOT-S) 8.6-50 MG tablet Take 1 tablet by mouth at bedtime as needed for mild constipation. 10/26/17  Yes Colbert Ewing, MD  tiotropium (SPIRIVA) 18 MCG inhalation capsule Place 18 mcg into inhaler and inhale daily.   Yes [provider]  triamcinolone cream (KENALOG) 0.1 % Apply 1 application topically 2 (two) times daily as needed (legs and other itchy areas).   Yes [provider]  amiodarone (PACERONE) 200 MG tablet Take one tablet by mouth daily Monday through Saturday. Do NOT take on Sunday. Patient not taking: Reported on 03/23/2021 06/11/19   Evans Lance, MD  multivitamin (RENA-VIT) TABS tablet Take 1 tablet by mouth at bedtime. Patient not taking: Reported on 03/23/2021 10/26/17   Colbert Ewing, MD  predniSONE (DELTASONE) 10 MG tablet Take  10-40 mg by mouth See admin instructions. 22m daily for 3 days, then 295mdaily for 3 days, then 10105maily for 3 days. Then stop.    [provider]  risperiDONE (RISPERDAL) 0.5 MG tablet Take 0.5 mg by mouth 2 (two) times daily. Patient not taking: Reported on 03/23/2021    [provider]  traMADol (ULTRAM) 50 MG tablet Take 1-2 tablets (50-100 mg total) by mouth every 6 (six) hours as needed for moderate pain. Patient not taking: Reported on 03/23/2021 03/02/18   HerArdis HughsD    Inpatient Medications: Scheduled Meds:  Continuous Infusions:  epinephrine 4.5 mcg/min (03/23/21 1102)   PRN Meds:   Allergies:    Allergies  Allergen Reactions   Penicillins Hives, Nausea And Vomiting, Swelling and Other (See Comments)    Tolerated Cefepime Has patient had a PCN reaction causing immediate rash, facial/tongue/throat swelling, SOB or lightheadedness with hypotension: YES Has patient had a PCN reaction causing severe rash involving mucus membranes or skin necrosis: No Has patient had a PCN reaction that required hospitalization No Has patient had a PCN reaction occurring within the last 10 years: No If all of the above answers are "NO", then may proceed with Cephalosporin use.     Social History:   Social History   Socioeconomic History   Marital status: Single    Spouse name: Not on file   Number of children: 0   Years of education: Not on file   Highest education level: Not on file  Occupational History    Employer: DISABLED  Tobacco Use   Smoking status: Former    Packs/day: 1.00    Years: 42.00    Pack years: 42.00    Types: Cigarettes, Pipe    Quit date: 11/10/2010    Years since quitting: 10.3   Smokeless tobacco: Former  Quit date: 10/02/2010  Vaping Use   Vaping Use: Never used  Substance and Sexual Activity   Alcohol use: No    Alcohol/week: 0.0 standard drinks    Comment: " last drink was 2003" 08/11/15   Drug use: No   Sexual  activity: Not on file  Other Topics Concern   Not on file  Social History Narrative   Released from prison in 2003 after serving 2 years for indecency with a minor.    He smokes cigarettes greater than 30 years, also smoked a pipe.  Quit tobacco 09/2010.   Denies EtOH and drugs.    Social Determinants of Health   Financial Resource Strain: Not on file  Food Insecurity: Not on file  Transportation Needs: Not on file  Physical Activity: Not on file  Stress: Not on file  Social Connections: Not on file  Intimate Partner Violence: Not on file    Family History:    Family History  Problem Relation Age of Onset   Heart disease Mother    Hypertension Sister    Heart disease Brother    Heart disease Father    Peripheral vascular disease Father    Heart disease Maternal Grandmother    Heart attack Maternal Grandmother    Breast cancer Maternal Grandmother    Stomach cancer Maternal Uncle    Colon cancer Neg Hx    Stroke Neg Hx      ROS:  Unable to obtain due to cognitive impairment   Physical Exam/Data:   Vitals:   03/23/21 1054 03/23/21 1055 03/23/21 1059 03/23/21 1100  BP: (!) 102/49  (!) 86/56 (!) 89/50  Pulse: (!) 43  (!) 39 (!) 37  Resp: _0 Temp: 98.1 F (36.7 C)     TempSrc: Axillary     SpO2: 100%  100% 100%  Weight:  83.9 kg    Height:  5' (1.524 m)     No intake or output data in the 24 hours ending 03/23/21 1104 Last 3 Weights 03/23/2021 09/23/2020 03/27/2020  Weight (lbs) 185 lb 178 lb 194 lb  Weight (kg) 83.915 kg 80.74 kg 87.998 kg     Body mass index is 36.13 kg/m.   Vitals:  Vitals:   03/23/21 1204 03/23/21 1207  BP: 119/60 (!) 131/58  Pulse:    Resp: (!) 39 (!) 22  Temp: 98.2 F (36.8 C) 98.2 F (36.8 C)  SpO2:  100%   General Appearance: In no apparent distress, laying in bed, older than his age, chronically ill HEENT: Normocephalic, atraumatic.  Neck: Neck is short and thick, difficult assess JVD Cardiovascular: Irregularly  irregular, no murmur Respiratory: Resting breathing unlabored, lungs sounds clear but diminished at base to auscultation bilaterally, on NRB at 100% with pox 100% Gastrointestinal: Large abdominal mass noted to the right  Extremities: s/p right foot partial amputation, trace edema of BLE Genitourinary: Foley is draining cloudy urine  Musculoskeletal: Generalized muscular atrophy  Skin: Numerous skin tears and bruise of abdominal and extremities   Neurologic: Lethargic, oriented to self. Dysarthria, answered yes and no questions, cognitively impaired, not follow commands Psychiatric: calm    EKG:  The EKG was personally reviewed and demonstrates:  EKG at 03/23/21 at 10:16 AM Complete heart block, rate of 20s.   Telemetry:  Telemetry was personally reviewed and demonstrates:  A fib with rate  of 90-120s with RBBB, was in ventricular escape rhythm with rate of 30-40s prior   Relevant CV Studies:  Echo from 09/18/2017:  - Left ventricle: The cavity size was normal. Wall thickness was    increased in a pattern of mild LVH. Indeterminant diastolic    function, atrial fibrillation. Systolic function was mildly to    moderately reduced. The estimated ejection fraction was in the    range of 40% to 45%. Diffuse hypokinesis.  - Aortic valve: There was no stenosis.  - Mitral valve: Moderately calcified annulus. There was no    significant regurgitation.  - Left atrium: The atrium was mildly dilated.  - Right ventricle: The cavity size was mildly to moderately    dilated. Pacer wire or catheter noted in right ventricle.    Systolic function was mildly reduced.  - Right atrium: The atrium was mildly dilated.  - Tricuspid valve: Peak RV-RA gradient (S): 32 mm Hg.  - Pulmonary arteries: PA peak pressure: 35 mm Hg (S).  - Inferior vena cava: The vessel was normal in size. The    respirophasic diameter changes were in the normal range (>= 50%),    consistent with normal central venous pressure.    Impressions:   - The patient was in atrial fibrillation. Normal LV size with mild    LV hypertrophy. EF 40-45%. Mild-moderate RV dilation with mildly    decreased systolic function. Borderline pulmonary hypertension.   09/15/2017 temporary pacemaker:  He was last seen on 03/27/20 in the office, maintained in NSR on amiodarone, had no further symptomatic pauses from sinus node dysfunction perspective.  Laboratory Data:  High Sensitivity Troponin:  No results for input(s): TROPONINIHS in the last 720 hours.   Chemistry Recent Labs  Lab 03/23/21 1034  NA 130*  K 4.5  CL 102  GLUCOSE 361*  BUN 61*  CREATININE 2.50*    No results for input(s): PROT, ALBUMIN, AST, ALT, ALKPHOS, BILITOT in the last 168 hours. Hematology Recent Labs  Lab 03/23/21 1029 03/23/21 1034  WBC 16.6*  --   RBC 3.70*  --   HGB 11.3* 11.2*  HCT 35.4* 33.0*  MCV 95.7  --   MCH 30.5  --   MCHC 31.9  --   RDW 16.4*  --   PLT 345  --    BNPNo results for input(s): BNP, PROBNP in the last 168 hours.  DDimer No results for input(s): DDIMER in the last 168 hours.   Radiology/Studies:  DG Chest 2 View  Result Date: 03/22/2021 CLINICAL DATA:  Decreased breath sounds at right lung base. Chest pain with nonproductive cough for 2 weeks. EXAM: CHEST - 2 VIEW COMPARISON:  Radiograph 06/30/2020. FINDINGS: Similar low lung volumes. Patient is post median sternotomy. Similar cardiomegaly with aortic atherosclerosis and tortuosity there is patchy airspace disease at the right lung base, new from prior exam. Background peribronchial and interstitial thickening is similar. Minor subsegmental atelectasis at the left lung base. Suspected small right pleural effusion. No pneumothorax. Scoliotic curvature of the spine. IMPRESSION: 1. Patchy airspace disease at the right lung base, suspicious for pneumonia. Followup PA and lateral chest X-ray is recommended in 3-4 weeks following trial of antibiotic therapy to ensure resolution  and exclude underlying malignancy. 2. Suspected small right pleural effusion. 3. Stable cardiomegaly. Stable mild diffuse interstitial and bronchial thickening suggesting chronic lung disease, possible COPD. Electronically Signed   By: Keith Rake M.D.   On: 03/22/2021 23:51   DG Chest Portable 1 View  Result Date: 03/23/2021 CLINICAL DATA:  Status post CPR. EXAM: PORTABLE CHEST 1 VIEW COMPARISON:  Chest x-ray  from yesterday. FINDINGS: Unchanged mild cardiomegaly status post CABG. Increasing heterogeneous interstitial opacity in the left mid lung. Improved aeration at the right lung base. Unchanged small right pleural effusion. No pneumothorax. No acute osseous abnormality. IMPRESSION: 1. Increasing heterogeneous interstitial opacity in the left mid lung, likely mild asymmetric pulmonary edema. 2. Improved aeration at the right lung base. 3. Unchanged small right pleural effusion. Electronically Signed   By: Titus Dubin M.D.   On: 03/23/2021 10:47     Assessment and Plan:   Syncope  Asystolic arrest Complete heart block  - presented with recurrent syncope, asystole, in the setting of CHB, required multiple times brief CPR by EMS and ED with spontaneous recover of consciousness - currently on epi gtt, telemetry with A fib rate of 90-120s, BP stable - discussed goals of care with patient Niece at bedside, family interested speaking with palliative care and has a general understanding of how chronically ill he is and he may not necessarily be a good candidate for PPM due to underlying cognitive impairment as well as chronic complex comorbidities - palliative care consult requested, pending call back -EP consult requested, will make recommendation of PPM  - Place [place] near by, stop Metoprolol XL, continue telemetry monitor, ICU admission to PCCM is recommended   Paroxysmal atrial fibrillation Tachy-brady syndrome  - currently in A fib, rate is fairly controlled  - avoid AV nodal blocking  agent due to above  - EP to see  - May resume Eliquis if no contraindication  Acute hypoxic respiratory failure Sepsis Community-acquired pneumonia Hyponatremia Type 2 diabetes with hyperglycemia CKD stage IV with anion gap metabolic acidosis Chronic abdominal mass Numerous skin tears COPD Hypertension Hyperlipidemia GERD Developmental disability Bilateral carotid stenosis Hx of CVAs -Defer management to primary team   Risk Assessment/Risk Scores:   For questions or updates, please contact [facility] Please consult [email or web address] for contact info under    Signed, Margie Billet, NP  03/23/2021 11:04 AM  I have examined the patient and reviewed assessment and plan and discussed with patient.  Agree with above as stated.    Asked to see the patient emergently due to recurrent asystolic events.  He required CPR for several bradycardia arrests.  Epinephrine drip was started which helped keep his heart rate higher.  Most notably on exam, he has a large right-sided abdominal tumor.  Per the niece who is in the room at bedside, this has been a longstanding issue.  He also has several areas of skin breakdown.  In looking at the right groin area, there is some skin breakdown and likely some fungal infection.  Per the niece, hygiene is an issue.  We discussed the issue of bradycardia causing syncope and the balance between using rate slowing drugs to stop the fast heartbeats.  We also spoke at length about permanent pacemaker as a treatment for this and the multiple medical comorbidities that make this treatment difficult.  Compliance issues with the arm movement limitations will be difficult immediately postprocedure.  Multiple areas of skin breakdown and infection issues also will make things difficult.  Initially, the epinephrine was helping his heart rate.  We discussed titrating that to try to keep his heart rate between 60 and 100.  His bradycardic arrest recurred unfortunately.   Ultimately, I spoke with critical care who in consultation with the family, came up with a plan of 24 to 48-hour bridge with a temporary pacemaker.  If his heart rate does not improve at that point,  they would reassess goals of care at that time.  CRITICAL CARE Performed by: Larae Grooms   Total critical care time: 40 minutes  Critical care time was exclusive of separately billable procedures and treating other patients.  Critical care was necessary to treat or prevent imminent or life-threatening deterioration.  Critical care was time spent personally by me on the following activities: development of treatment plan with patient and/or surrogate as well as nursing, discussions with consultants, evaluation of patient's response to treatment, examination of patient, obtaining history from patient or surrogate, ordering and performing treatments and interventions, ordering and review of laboratory studies, ordering and review of radiographic studies, pulse oximetry and re-evaluation of patient's condition.   Larae Grooms

## 2021-03-23 NOTE — Significant Event (Signed)
           Patient again became bradycardic.  Discussed with Dr. Tamala Julian from critical care. Plan for temp pacer for 24-48 hours to allow time to see if this is a reversible process.  EP aware and will evaluate.  I doubt he will be a permanent pacer candidate due to his other comorbidities, difficulty following the arm movement restrictions and risk of infection. Discussed these issues with the niece at bedside. Full consult to follow.  Jettie Booze, MD

## 2021-03-23 NOTE — ED Notes (Addendum)
Ray, MD aware of lactic acid of 5.2 and troponin of 466.

## 2021-03-23 NOTE — H&P (Signed)
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 Presented to [facility] ED via EMS 8/16 for syncopal episode vs. episode of unresponsiveness secondary to asystole.   Per EMS report, patient was attempting to exit the bus with his walker when he had a syncopal episode vs. episode of unresponsiveness and fell. Chest compressions were initiated x 1 round and patient became responsive with jerking, clonic movements. Glucose in the field was 350. On arrival to ED, patient was bradycardic and received atropine without improvement. Epi gtt was started and rate/rhythm changed to ST, then VT. Patient continued to have multiple episodes of unresponsiveness/asystole requiring brief CPR vs. stimulation. Cardiology was consulted for recommendations. CXR with c/f RLL PNA, empiric ceftriaxone/azithromycin started.  PCCM was consulted for ICU admission.  Pertinent Medical History:   Past Medical History:  Diagnosis Date   Abdominal hernia    Chronic, not a good surgical candidate   Abscess, abdomen 12/31/2010   Referred to Wound Care in 01/2011 because of multiple abd abscess with VERY large ventral hernia (please look at image of CT abd/pelvis 09/2010).  Because of hernia I was hesitant to I&D.       Anemia    History of Iron Def Anemia   Anxiety    AVM (arteriovenous malformation)    chronic GI blood loss   Carotid artery disease (HCC)    s/p CEA   Chronic diastolic CHF (congestive heart  failure) (HCC)    takes Lasix   Chronic low back pain    COPD (chronic obstructive pulmonary disease) (HCC)    CVA (cerebral infarction) 09/2010   Bilateral with Left > Right   Diabetes mellitus    GERD (gastroesophageal reflux disease)    History of nuclear stress test    Myoview 9/16:  Inferior, apical and inf-lateral ischemia; not gated; High Risk   Hx of echocardiogram    Echo 5/16:  Mild LVH, EF 55%, indeterm. diast function, WMA could not be ruled out, MAC, trivial MR, mild LAE, normal RVF //  b. Echo 4/17: EF 55-60%, no RWMA, Gr 2 DD, Ao sclerosis, MAC, mild MS, mild LAE, PASP 55 mHg   Hyperlipidemia    Hypertension    Intellectual disability    Sister helps to take care of him and takes him to appts   Itching    all over body; pt scratches and has sores on bilateral arms and abdomen   Lung nodule    Myocardial infarction [name]) 2016 ?   Heart attack  (  Per  pt. )   Obesity    Oxygen dependent    wears 2 liters at bedtime and when needed   PAF (paroxysmal atrial fibrillation) ([place]) 06/2009   CHADS score 2 (HTN, DM) // Pradaxa for Afib // Pradaxa stopped due to worsening anemia (Hgb in 5/18 8.5 >> Pradaxa remains on hold)    Pneumonia    hx of   Stroke ([place])    Tobacco user    Smokes 1ppd for multiple  years.  Quit after hosp 09/2010.   Tubular adenoma of colon    Wears glasses    Significant Hospital Events: Including procedures, antibiotic start and stop dates in addition to other pertinent events   8/16 BIB EMS after patient was exiting the bus and had a syncopal episode/episode of unresponsiveness. Some "jerking" noted as patient became responsive. One round of CPR. In ED, multiple episodes of asystole lasting ~10 seconds requiring 10-20 seconds of CPR each time. Cards/CCM consulted. Epi gtt started. Ceftriaxone/Azithro. Plan for temp pacing wire.  Interim History / Subjective:  CCM consulted for admission  Objective:  Blood pressure 133/67, pulse 60, temperature 98.4  F (36.9 C), resp. rate (!) 34, height 5' (1.524 m), weight 83.9 kg, SpO2 100 %.       No intake or output data in the 24 hours ending 03/23/21 1311 Filed Weights   03/23/21 1055  Weight: 83.9 kg   Physical Examination: General: Chronically ill-appearing middle-aged man, appears anxious. HEENT: [place]/AT, anicteric sclera, PERRL, dry mucous membranes. [place] and NRB in place. Neuro: Awake, oriented x 4. Intermittently becomes briefly unresponsive and asystolic, waking with stimulation. Responds to verbal stimuli. Following commands consistently. Moves all 4 extremities spontaneously.  CV: Irregularly irregular rhythm, rate variable 40s to 120s, no m/g/r. PULM: Breathing even and minimally labored on NRB. Lung fields diminished with bibasilar crackles, R > L. GI: Obese, large ventral hernia noted, mildly distended. Hypoactive bowel sounds. Multiple wounds in various stages of healing on anterior abdomen. Extremities: No LE edema noted. Skin: Warm/dry, multiple areas of ecchymosis on bilateral forearms, bilateral anterior shins. Skin tears noted to anterior L shin and L dorsal hand.  Resolved Hospital Problem List     Assessment & Plan:  Sick sinus syndrome with cardiac collapse and asystole Tachy-brady syndrome Presented with several episodes of unresponsiveness/asystole requiring compressions/CPR to rouse. Longstanding issues with tachy-brady syndrome; deemed not a permanent pacemaker candidate due to multiple comorbidities, infection risk and likely inability to heed post-procedure restrictions. - Cardiology consulted, appreciate assistance - Epi gtt - Plan for temporary pacer wire as a bridge out of cardiac dysfunction - If unable to bridge in 24-28H, patient would NOT be a permanent pacemaker candidate - Palliative consult for GOC/planning, family is aware  Paroxysmal Afib History of PAF, on Eliquis at home. - Resume Eliquis 8/17AM (post-procedure) - Cardiac monitoring  CAD - s/p CABG  08/2015 History of MI - 2016 History of CVA - 2012 - Cardiology following  Chronic combined systolic and diastolic HF Echo 04/3733 EF 40-45%. Takes Lasix 61m BID at home. - Repeat Echo - Hold BB at present, given nodal dysfunction - Assess diuresis needs daily  History of HTN Hyperlipidemia - Hold - Continue statin  COPD - on 2L HOT CAP - Continue supplemental O2 - Wean O2 for sat > 88% - Pulmonary hygiene - Bronchodilators - Empiric ceftriaxone/azithromycin for CAP - CXR in AM - Trend WBC, LA  T2DM Diabetic peripheral neuropathy - S/p R transmetatarsal amputation - Semglee 26U daily at home - SSI - CBG Q4H  CKD stage IIIa Baseline Cr 2.5-3.2.  - Trend BMP - Replete electrolytes as indicated - Monitor I&Os - Avoid nephrotoxic agents as able - Ensure adequate renal perfusion  Mild developmental delay - Patient's sister is HProgrammer, systems (right click and "Reselect all SmartList Selections" daily)   Diet/type: NPO DVT prophylaxis: DOAC GI prophylaxis: PPI Lines: N/A Foley:  Yes, and it is still needed Code Status:  full code  Last date of multidisciplinary goals of care discussion: Dr. Tamala Julian (CCM) discussed with patient, patient's sisters and niece at bedside in ED today, 8/16 regarding current clinical status. Advised temporary pacing wire placement to see if patient is able to improve; if unable to bridge after 24-48 hours with temp wire, patient is not a permanent pacemaker candidate due to multiple comorbidities. Palliative consult will be placed to assist with GOC/planning in the event patient does not clinically improve.  Labs:  CBC: Recent Labs  Lab 03/23/21 1029 03/23/21 1034  WBC 16.6*  --   NEUTROABS 14.1*  --   HGB 11.3* 11.2*  HCT 35.4* 33.0*  MCV 95.7  --   PLT 345  --    Basic Metabolic Panel: Recent Labs  Lab 03/23/21 1029 03/23/21 1034  NA 129* 130*  K 4.6 4.5  CL 97* 102  CO2 16*  --   GLUCOSE 366* 361*  BUN 67* 61*   CREATININE 2.45* 2.50*  CALCIUM 8.5*  --    GFR: Estimated Creatinine Clearance: 26.1 mL/min (A) (by C-G formula based on SCr of 2.5 mg/dL (H)). Recent Labs  Lab 03/23/21 1029  WBC 16.6*  LATICACIDVEN 5.2*   Liver Function Tests: Recent Labs  Lab 03/23/21 1029  AST 41  ALT 20  ALKPHOS 83  BILITOT 1.0  PROT 6.9  ALBUMIN 2.5*   No results for input(s): LIPASE, AMYLASE in the last 168 hours. No results for input(s): AMMONIA in the last 168 hours.  ABG:    Component Value Date/Time   PHART 7.405 09/25/2017 1633   PCO2ART 33.4 09/25/2017 1633   PO2ART 105.0 09/25/2017 1633   HCO3 21.0 09/25/2017 1633   TCO2 16 (L) 03/23/2021 1034   ACIDBASEDEF 3.0 (H) 09/25/2017 1633   O2SAT 98.0 09/25/2017 1633    Coagulation Profile: No results for input(s): INR, PROTIME in the last 168 hours.  Cardiac Enzymes: No results for input(s): CKTOTAL, CKMB, CKMBINDEX, TROPONINI in the last 168 hours.  HbA1C: Hemoglobin A1C  Date/Time Value Ref Range Status  08/11/2015 12:00 AM 5.6  Final   Hgb A1c MFr Bld  Date/Time Value Ref Range Status  09/28/2017 04:54 AM 7.9 (H) 4.8 - 5.6 % Final    Comment:    (NOTE) Pre diabetes:          5.7%-6.4% Diabetes:              >6.4% Glycemic control for   <7.0% adults with diabetes   08/11/2015 12:09 PM 5.8 (H) 4.8 - 5.6 % Final    Comment:    (NOTE)         Pre-diabetes: 5.7 - 6.4         Diabetes: >6.4         Glycemic control for adults with diabetes: <7.0    CBG: No results for input(s): GLUCAP in the last 168 hours.  Review of Systems:   Review of systems completed with pertinent positives/negatives outlined in above HPI.  Past Medical History:  He,  has a past medical history of Abdominal hernia, Abscess, abdomen (12/31/2010), Anemia, Anxiety, AVM (arteriovenous malformation), Carotid artery disease ([place]), Chronic diastolic CHF (congestive heart failure) ([place]), Chronic low back pain, COPD (chronic obstructive pulmonary disease)  ([place]), CVA (cerebral infarction) (09/2010), Diabetes mellitus, GERD (gastroesophageal reflux disease), History of nuclear stress test, echocardiogram, Hyperlipidemia, Hypertension, Intellectual disability, Itching, Lung nodule, Myocardial infarction ([place]) (2016 ?), Obesity, Oxygen dependent, PAF (paroxysmal atrial fibrillation) ([place]) (06/2009), Pneumonia, Stroke ([place]), Tobacco user, Tubular  adenoma of colon, and Wears glasses.   Surgical History:   Past Surgical History:  Procedure Laterality Date   AMPUTATION Right 03/29/2013   Procedure: AMPUTATION RAY;  Surgeon: Newt Minion, MD;  Location: [place];  Service: Orthopedics;  Laterality: Right;  Right Foot 3rd and Possible 4th Ray Amputation   AMPUTATION Right 04/23/2013   Procedure: AMPUTATION RIGHT MID-FOOT;  Surgeon: Newt Minion, MD;  Location: [place];  Service: Orthopedics;  Laterality: Right;   arm surgery Left    as a child   CARDIAC CATHETERIZATION N/A 04/30/2015   Procedure: Left Heart Cath and Coronary Angiography;  Surgeon: Belva Crome, MD;  Location: [place] [facility];  Service: Cardiovascular;  Laterality: N/A;   Carotid arteriogram  10/2010   30% right ICA stenosis, 40% left ICA stenosis    CAROTID ENDARTERECTOMY Left 08-13-15   CEA   CATARACT EXTRACTION, BILATERAL     COLONOSCOPY WITH PROPOFOL N/A 06/10/2014   Procedure: COLONOSCOPY WITH PROPOFOL;  Surgeon: Jerene Bears, MD;  Location: [facility];  Service: Gastroenterology;  Laterality: N/A;   CORONARY ARTERY BYPASS GRAFT N/A 08/13/2015   Procedure: CORONARY ARTERY BYPASS GRAFTING (CABG), ON PUMP, TIMES THREE, USING LEFT INTERNAL MAMMARY ARTERY, RIGHT GREATER SAPHENOUS VEIN HARVESTED ENDOSCOPICALLY;  Surgeon: [name], MD;  Location: [place];  Service: Open Heart Surgery;  Laterality: N/A;   DEBRIDMENT OF DECUBITUS ULCER Right 02/13/2013   ENDARTERECTOMY Left 08/13/2015   Procedure: ENDARTERECTOMY CAROTID;  Surgeon: Angelia Mould, MD;  Location: [place];  Service: Vascular;   Laterality: Left;   ESOPHAGOGASTRODUODENOSCOPY (EGD) WITH PROPOFOL N/A 06/10/2014   Procedure: ESOPHAGOGASTRODUODENOSCOPY (EGD) WITH PROPOFOL;  Surgeon: Jerene Bears, MD;  Location: [facility];  Service: Gastroenterology;  Laterality: N/A;   GIVENS CAPSULE STUDY N/A 07/09/2014   Procedure: GIVENS CAPSULE STUDY;  Surgeon: Jerene Bears, MD;  Location: [facility];  Service: Gastroenterology;  Laterality: N/A;   I & D EXTREMITY Right 02/13/2013   Procedure: IRRIGATION AND DEBRIDEMENT FOOT ULCER;  Surgeon: Johnny Bridge, MD;  Location: [place];  Service: Orthopedics;  Laterality: Right;  PULSE LAVAGE   INSERTION OF DIALYSIS CATHETER N/A 10/02/2017   Procedure: INSERTION OF TUNNELED DIALYSIS CATHETER;  Surgeon: Elam Dutch, MD;  Location: [facility];  Service: Vascular;  Laterality: N/A;   IR REMOVAL TUN CV CATH W/O FL  10/25/2017   MULTIPLE TOOTH EXTRACTIONS     TEE WITHOUT CARDIOVERSION N/A 08/13/2015   Procedure: TRANSESOPHAGEAL ECHOCARDIOGRAM (TEE);  Surgeon: [name], MD;  Location: [place];  Service: Open Heart Surgery;  Laterality: N/A;   TEMPORARY PACEMAKER N/A 09/15/2017   Procedure: TEMPORARY PACEMAKER;  Surgeon: Jettie Booze, MD;  Location: [place] [facility];  Service: Cardiovascular;  Laterality: N/A;   TRANSESOPHAGEAL ECHOCARDIOGRAM  09/2010   No ASD or PFO. EF 60-65%.  Normal systolic function. No evidence of thrombus.    TRANSTHORACIC ECHOCARDIOGRAM  09/2010    The cavity size was normal. Systolic function was vigorous.  EF 65-70%.  Normal wall funciton.    ULTRASOUND GUIDANCE FOR VASCULAR ACCESS  09/15/2017   Procedure: Ultrasound Guidance For Vascular Access;  Surgeon: Jettie Booze, MD;  Location: [place] [facility];  Service: Cardiovascular;;    Social History:   reports that he quit smoking about 10 years ago. His smoking use included cigarettes and pipe. He has a 42.00 pack-year smoking history. He quit smokeless tobacco use about 10 years ago. He reports that he  does not drink alcohol and does not use drugs.   Family History:  His family history includes Breast cancer in his maternal grandmother; Heart attack in his maternal grandmother; Heart disease in his brother, father, maternal grandmother, and mother; Hypertension in his sister; Peripheral vascular disease in his father; Stomach cancer in his maternal uncle. There is no history of Colon cancer or Stroke.   Allergies: Allergies  Allergen Reactions   Penicillins Hives, Nausea And Vomiting, Swelling and Other (See Comments)    Tolerated Cefepime Has patient had a PCN reaction causing immediate rash, facial/tongue/throat swelling, SOB or lightheadedness with hypotension: YES Has patient had a PCN reaction causing severe rash involving mucus membranes or skin necrosis: No Has patient had a PCN reaction that required hospitalization No Has patient had a PCN reaction occurring within the last 10 years: No If all of the above answers are "NO", then may proceed with Cephalosporin use.     Home Medications: Prior to Admission medications   Medication Sig Start Date End Date Taking? Authorizing Provider  acetaminophen (TYLENOL) 325 MG tablet Take 650 mg by mouth 3 (three) times daily as needed for mild pain.   Yes [provider]  apixaban (ELIQUIS) 2.5 MG TABS tablet Take 2.5 mg by mouth 2 (two) times daily.   Yes [provider]  aspirin 81 MG EC tablet Take 81 mg by mouth daily.   Yes [provider]  atorvastatin (LIPITOR) 80 MG tablet Take 1 tablet (80 mg total) by mouth daily. 10/26/17  Yes Colbert Ewing, MD  calcium carbonate (TUMS - DOSED IN MG ELEMENTAL CALCIUM) 500 MG chewable tablet Chew 1 tablet by mouth 3 (three) times daily as needed for indigestion or heartburn.   Yes [provider]  Cholecalciferol (VITAMIN D3) 50 MCG (2000 UT) TABS Take 50 mcg by mouth daily.   Yes [provider]  Darbepoetin Alfa (ARANESP) 150 MCG/0.3ML SOSY injection  Inject 0.3 mLs (150 mcg total) into the skin every Wednesday at 6 PM. Patient taking differently: Inject 150 mcg into the skin See admin instructions. Every 8 weeks 11/01/17  Yes Colbert Ewing, MD  furosemide (LASIX) 80 MG tablet Take 1 tablet (80 mg total) by mouth 2 (two) times daily as needed for fluid. 10/26/17 05/15/21 Yes Colbert Ewing, MD  HYDROcodone-acetaminophen (NORCO/VICODIN) 5-325 MG tablet Take 1 tablet by mouth daily.   Yes [provider]  insulin glargine (LANTUS) 100 UNIT/ML injection Inject 0.14 mLs (14 Units total) into the skin at bedtime. Patient taking differently: Inject 26 Units into the skin daily. 10/26/17  Yes Colbert Ewing, MD  ipratropium-albuterol (DUONEB) 0.5-2.5 (3) MG/3ML SOLN Take 3 mLs by nebulization 2 (two) times daily as needed (COPD).   Yes [provider]  metoprolol succinate (TOPROL-XL) 25 MG 24 hr tablet Take 25 mg by mouth daily.   Yes [provider]  Multiple Vitamin (MULTIVITAMIN) capsule Take 1 capsule by mouth daily.   Yes [provider]  nitroGLYCERIN (NITROSTAT) 0.4 MG SL tablet Place 0.4 mg under the tongue every 5 (five) minutes as needed for chest pain.   Yes [provider]  nystatin (MYCOSTATIN/NYSTOP) powder Apply topically 4 (four) times daily. Apply to inguinal area/scrotum and under pannus Patient taking differently: Apply 1 application topically in the morning, at noon, and at bedtime. To skin folds to treat intertrigo 03/02/18  Yes Ardis Hughs, MD  OXYGEN Inhale 2.5 L into the lungs See admin instructions. As needed and at bedtime for  shortness of breath   Yes [provider]  pantoprazole (PROTONIX) 40 MG tablet Take 40 mg by mouth daily.   Yes [provider]  PARoxetine (PAXIL) 40 MG tablet Take 40 mg by mouth daily.    Yes [provider]  polyvinyl alcohol (LIQUIFILM TEARS) 1.4 % ophthalmic solution Place 1 drop into both eyes 2 (two) times daily as  needed for dry eyes. Natural Tears   Yes [provider]  senna-docusate (SENOKOT-S) 8.6-50 MG tablet Take 1 tablet by mouth at bedtime as needed for mild constipation. 10/26/17  Yes Colbert Ewing, MD  tiotropium (SPIRIVA) 18 MCG inhalation capsule Place 18 mcg into inhaler and inhale daily.   Yes [provider]  triamcinolone cream (KENALOG) 0.1 % Apply 1 application topically 2 (two) times daily as needed (legs and other itchy areas).   Yes [provider]  amiodarone (PACERONE) 200 MG tablet Take one tablet by mouth daily Monday through Saturday. Do NOT take on Sunday. Patient not taking: Reported on 03/23/2021 06/11/19   Evans Lance, MD  multivitamin (RENA-VIT) TABS tablet Take 1 tablet by mouth at bedtime. Patient not taking: Reported on 03/23/2021 10/26/17   Colbert Ewing, MD  predniSONE (DELTASONE) 10 MG tablet Take 10-40 mg by mouth See admin instructions. 49m daily for 3 days, then 213mdaily for 3 days, then 1042maily for 3 days. Then stop.    [provider]  risperiDONE (RISPERDAL) 0.5 MG tablet Take 0.5 mg by mouth 2 (two) times daily. Patient not taking: Reported on 03/23/2021    [provider]  traMADol (ULTRAM) 50 MG tablet Take 1-2 tablets (50-100 mg total) by mouth every 6 (six) hours as needed for moderate pain. Patient not taking: Reported on 03/23/2021 03/02/18   HerArdis HughsD    Critical care time: 45 minutes   [facility] 03/23/21 1:11 PM  Please see [email or web address] for pager details.  From 7A-7P if no response, please call [phone] After hours, please call ELink [phone]

## 2021-03-23 NOTE — ED Notes (Addendum)
Pt had episode of asystole lasting roughly 6 seconds, as this RN was lowering HOB to start compressions, pt began coughing and went back into afib.

## 2021-03-23 NOTE — ED Triage Notes (Addendum)
[facility]. Pt was diagnosed with PNA of the R lower lobe yesterday, sent home with prednisone. Pt had a syncopal episode upon getting out of the [facility] truck, abrasions to legs bilaterally. EMS found pt to be in a RBBB at a rate of 72, EMS reports pt has almost seizure like activity and appears to be in asystole on the monitor. Upon EMS arrival to ED, pt was awake, SB on monitor.

## 2021-03-23 NOTE — Sepsis Progress Note (Signed)
Pt to cath lab, MD placed order to not delay antibiotics for blood cultures

## 2021-03-23 NOTE — Sepsis Progress Note (Signed)
Sepsis protocol discontinued per verbal order from Dr Tamala Julian

## 2021-03-23 NOTE — Plan of Care (Signed)

## 2021-03-23 NOTE — ED Notes (Addendum)
Pt went into asystole and become unresponsive at 1020. Short round of CPR initiated. Pt bradycardic after short round of CPR. Pt given '1mg'$  atropine IV at 1020. Pt continues to have episodes of asystole, CPR initiated each episode. Pt will start talking to staff. CPR initiated multiple times with pt gaining pulses and talking to staff. EPI given at 1024.

## 2021-03-23 NOTE — Progress Notes (Signed)
Echocardiogram 2D Echocardiogram has been performed.  Oneal Deputy Doyt Castellana RDCS 03/23/2021, 3:49 PM

## 2021-03-23 NOTE — ED Notes (Signed)
Cards pgd [id number]

## 2021-03-23 NOTE — ED Notes (Signed)
Pt had 3 episodes of asystole within 8 minute period. CPR not initiated as pt regains pulses.

## 2021-03-23 NOTE — Consult Note (Signed)
Consultation Note Date: 03/23/2021   Patient Name: [name]  DOB: [birth year]  MRN: [id number]  Age / Sex: 65 y.o., male  PCP: Janifer Adie, MD Referring Physician: Candee Furbish, MD  Reason for Consultation: Establishing goals of care  HPI/Patient Profile: 65 y.o. 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   Patient is enrolled in PACE program. In the ED, he has recurrent asystolic events with several rounds of CPR. He is not a candidate for a permanent pacemaker. Palliative medicine has been consulted to assist with goals of care conversation.  Clinical Assessment and Goals of Care: Family members present: sister [name]), sister Inez Catalina), nieces Abigail Butts, Lelan Pons), brother-in-law Nicole Kindred), nephew? Roselyn Reef), granddaughter Jarrett Soho)  I have reviewed medical records including EPIC notes, labs and imaging, assessed the patient and then met along with several of patient's family members to discuss diagnosis prognosis, [place], EOL wishes, disposition and options.  I introduced Palliative Medicine as specialized medical care for people living with serious illness. It focuses on providing relief from the symptoms and stress of a serious illness. The goal is to improve quality of life for both the patient and the family.  We discussed a brief life review of the patient and then focused on their current illness. Kelley is a happy person who enjoys watching movies, eating his favorite meals, fishing, going to the [employer], and spending time with his family. He has lived with his sister/HCPOA [name] since their  mother died in 20. At baseline he is able to express his desires and needs, such as his clothes getting washed, and ambulates with a walker. Family laments how suddenly the patient has declined, as they were worried about a cough for a few weeks and his only other symptom was weakness. We discussed his diagnosis of pneumonia and COVID, reviewing that this has likely caused strain on his heart, exacerbating his chronic conditions including tachybrady syndrome and systolic/diastolic CHF. Family is concerned that this was not diagnosed sooner and that he might be doing better if a more thorough workup was completed yesterday when he presented for imaging. Therapeutic listening and emotional support was provided. Family understands that arrest may continue to occur despite medications and temporary pacemaker.  I attempted to elicit values and goals of care important to the patient.   Patient's family do not want him to suffer however they feel the patient would want to try the temporary pacemaker as he has in the past. He would also be open to a prolonged recovery and nursing home if his heart rate stabilizes. They speak of his fondness for [facility] and "getting attention and flirting with the nurses." They share that Mccabe strongly values his family and he would want to be surrounded by  loved ones when it is his time to pass. They understand he is not a candidate for a permanent pacemaker given his comorbidities, reflecting that he has not sought surgery on his abdominal mass due to the risk of dying on the table. Family would be open to a timed trial of intubation but not prolonged dependency on artificial support.  The difference between aggressive medical intervention and comfort care was considered in light of the patient's goals of care.    Advanced directives, concepts specific to code status, artifical feeding and hydration, and rehospitalization were considered and discussed.   Discussed the  importance of continued conversation with family and the medical providers regarding overall plan of care and treatment options, ensuring decisions are within the context of the patient's values and GOCs.    Questions and concerns were addressed.  Hard Choices booklet left for review. The family was encouraged to call with questions or concerns.  PMT will continue to support holistically.   HCPOA is patient's sister [name], awaiting documentation. He has 4 living siblings who are next of kin.    SUMMARY OF RECOMMENDATIONS   -Full code/full scope treatment for 24-48 hours to determine whether patient will improve with temporary pacer wire, antibiotics, etc  -Family would transition to comfort care if no improvement by then -Psychosocial and emotional support provided -Ongoing [place] discussions  Code Status/Advance Care Planning: Full code  Palliative Prophylaxis:  Frequent Pain Assessment  Additional Recommendations (Limitations, Scope, Preferences): Full Scope Treatment  Psycho-social/Spiritual:  Desire for further Chaplaincy support:tbd Additional Recommendations: Education on Hospice and ICU Family Guide  Prognosis:  Unable to determine  Discharge Planning: To Be Determined      Primary Diagnoses: Present on Admission: **None**   I have reviewed the medical record, interviewed the patient and family, and examined the patient. The following aspects are pertinent.  Past Medical History:  Diagnosis Date   Abdominal hernia    Chronic, not a good surgical candidate   Abscess, abdomen 12/31/2010   Referred to Wound Care in 01/2011 because of multiple abd abscess with VERY large ventral hernia (please look at image of CT abd/pelvis 09/2010).  Because of hernia I was hesitant to I&D.       Anemia    History of Iron Def Anemia   Anxiety    AVM (arteriovenous malformation)    chronic GI blood loss   Carotid artery disease (HCC)    s/p CEA   Chronic diastolic CHF (congestive  heart failure) (HCC)    takes Lasix   Chronic low back pain    COPD (chronic obstructive pulmonary disease) (HCC)    CVA (cerebral infarction) 09/2010   Bilateral with Left > Right   Diabetes mellitus    GERD (gastroesophageal reflux disease)    History of nuclear stress test    Myoview 9/16:  Inferior, apical and inf-lateral ischemia; not gated; High Risk   Hx of echocardiogram    Echo 5/16:  Mild LVH, EF 55%, indeterm. diast function, WMA could not be ruled out, MAC, trivial MR, mild LAE, normal RVF //  b. Echo 4/17: EF 55-60%, no RWMA, Gr 2 DD, Ao sclerosis, MAC, mild MS, mild LAE, PASP 55 mHg   Hyperlipidemia    Hypertension    Intellectual disability    Sister helps to take care of him and takes him to appts   Itching    all over body; pt scratches and has sores on bilateral arms and abdomen   Lung  nodule    Myocardial infarction [facility]) 2016 ?   Heart attack  (  Per  pt. )   Obesity    Oxygen dependent    wears 2 liters at bedtime and when needed   PAF (paroxysmal atrial fibrillation) ([place]) 06/2009   CHADS score 2 (HTN, DM) // Pradaxa for Afib // Pradaxa stopped due to worsening anemia (Hgb in 5/18 8.5 >> Pradaxa remains on hold)    Pneumonia    hx of   Stroke ([place])    Tobacco user    Smokes 1ppd for multiple years.  Quit after hosp 09/2010.   Tubular adenoma of colon    Wears glasses    Social History   Socioeconomic History   Marital status: Single    Spouse name: Not on file   Number of children: 0   Years of education: Not on file   Highest education level: Not on file  Occupational History    Employer: DISABLED  Tobacco Use   Smoking status: Former    Packs/day: 1.00    Years: 42.00    Pack years: 42.00    Types: Cigarettes, Pipe    Quit date: 11/10/2010    Years since quitting: 10.3   Smokeless tobacco: Former    Quit date: 10/02/2010  Vaping Use   Vaping Use: Never used  Substance and Sexual Activity   Alcohol use: No    Alcohol/week: 0.0 standard  drinks    Comment: " last drink was 2003" 08/11/15   Drug use: No   Sexual activity: Not on file  Other Topics Concern   Not on file  Social History Narrative   Released from prison in 2003 after serving 2 years for indecency with a minor.    He smokes cigarettes greater than 30 years, also smoked a pipe.  Quit tobacco 09/2010.   Denies EtOH and drugs.    Social Determinants of Health   Financial Resource Strain: Not on file  Food Insecurity: Not on file  Transportation Needs: Not on file  Physical Activity: Not on file  Stress: Not on file  Social Connections: Not on file   Family History  Problem Relation Age of Onset   Heart disease Mother    Hypertension Sister    Heart disease Brother    Heart disease Father    Peripheral vascular disease Father    Heart disease Maternal Grandmother    Heart attack Maternal Grandmother    Breast cancer Maternal Grandmother    Stomach cancer Maternal Uncle    Colon cancer Neg Hx    Stroke Neg Hx    Scheduled Meds: Continuous Infusions:  [MAR Hold] azithromycin     [MAR Hold] cefTRIAXone (ROCEPHIN)  IV     [MAR Hold] epinephrine 10 mcg/min (03/23/21 1301)   lactated ringers 150 mL/hr at 03/23/21 1312   PRN Meds:.[MAR Hold] docusate sodium, [MAR Hold] polyethylene glycol Medications Prior to Admission:  Prior to Admission medications   Medication Sig Start Date End Date Taking? Authorizing Provider  acetaminophen (TYLENOL) 325 MG tablet Take 650 mg by mouth 3 (three) times daily as needed for mild pain.   Yes [provider]  apixaban (ELIQUIS) 2.5 MG TABS tablet Take 2.5 mg by mouth 2 (two) times daily.   Yes [provider]  aspirin 81 MG EC tablet Take 81 mg by mouth daily.   Yes [provider]  atorvastatin (LIPITOR) 80 MG tablet Take 1 tablet (80 mg total) by mouth  daily. 10/26/17  Yes Colbert Ewing, MD  calcium carbonate (TUMS - DOSED IN MG ELEMENTAL CALCIUM) 500 MG chewable tablet Chew 1 tablet by  mouth 3 (three) times daily as needed for indigestion or heartburn.   Yes [provider]  Cholecalciferol (VITAMIN D3) 50 MCG (2000 UT) TABS Take 50 mcg by mouth daily.   Yes [provider]  Darbepoetin Alfa (ARANESP) 150 MCG/0.3ML SOSY injection Inject 0.3 mLs (150 mcg total) into the skin every Wednesday at 6 PM. Patient taking differently: Inject 150 mcg into the skin See admin instructions. Every 8 weeks 11/01/17  Yes Colbert Ewing, MD  furosemide (LASIX) 80 MG tablet Take 1 tablet (80 mg total) by mouth 2 (two) times daily as needed for fluid. 10/26/17 05/15/21 Yes Colbert Ewing, MD  HYDROcodone-acetaminophen (NORCO/VICODIN) 5-325 MG tablet Take 1 tablet by mouth daily.   Yes [provider]  insulin glargine (LANTUS) 100 UNIT/ML injection Inject 0.14 mLs (14 Units total) into the skin at bedtime. Patient taking differently: Inject 26 Units into the skin daily. 10/26/17  Yes Colbert Ewing, MD  ipratropium-albuterol (DUONEB) 0.5-2.5 (3) MG/3ML SOLN Take 3 mLs by nebulization 2 (two) times daily as needed (COPD).   Yes [provider]  metoprolol succinate (TOPROL-XL) 25 MG 24 hr tablet Take 25 mg by mouth daily.   Yes [provider]  Multiple Vitamin (MULTIVITAMIN) capsule Take 1 capsule by mouth daily.   Yes [provider]  nitroGLYCERIN (NITROSTAT) 0.4 MG SL tablet Place 0.4 mg under the tongue every 5 (five) minutes as needed for chest pain.   Yes [provider]  nystatin (MYCOSTATIN/NYSTOP) powder Apply topically 4 (four) times daily. Apply to inguinal area/scrotum and under pannus Patient taking differently: Apply 1 application topically in the morning, at noon, and at bedtime. To skin folds to treat intertrigo 03/02/18  Yes Ardis Hughs, MD  OXYGEN Inhale 2.5 L into the lungs See admin instructions. As needed and at bedtime for shortness of breath   Yes [provider]  pantoprazole (PROTONIX) 40 MG tablet  Take 40 mg by mouth daily.   Yes [provider]  PARoxetine (PAXIL) 40 MG tablet Take 40 mg by mouth daily.    Yes [provider]  polyvinyl alcohol (LIQUIFILM TEARS) 1.4 % ophthalmic solution Place 1 drop into both eyes 2 (two) times daily as needed for dry eyes. Natural Tears   Yes [provider]  senna-docusate (SENOKOT-S) 8.6-50 MG tablet Take 1 tablet by mouth at bedtime as needed for mild constipation. 10/26/17  Yes Colbert Ewing, MD  tiotropium (SPIRIVA) 18 MCG inhalation capsule Place 18 mcg into inhaler and inhale daily.   Yes [provider]  triamcinolone cream (KENALOG) 0.1 % Apply 1 application topically 2 (two) times daily as needed (legs and other itchy areas).   Yes [provider]  amiodarone (PACERONE) 200 MG tablet Take one tablet by mouth daily Monday through Saturday. Do NOT take on Sunday. Patient not taking: Reported on 03/23/2021 06/11/19   Evans Lance, MD  multivitamin (RENA-VIT) TABS tablet Take 1 tablet by mouth at bedtime. Patient not taking: Reported on 03/23/2021 10/26/17   Colbert Ewing, MD  predniSONE (DELTASONE) 10 MG tablet Take 10-40 mg by mouth See admin instructions. 60m daily for 3 days, then 283mdaily for 3 days, then 1051maily for 3 days. Then stop.    [provider]  risperiDONE (RISPERDAL) 0.5 MG tablet Take 0.5 mg by mouth 2 (two)  times daily. Patient not taking: Reported on 03/23/2021    [provider]  traMADol (ULTRAM) 50 MG tablet Take 1-2 tablets (50-100 mg total) by mouth every 6 (six) hours as needed for moderate pain. Patient not taking: Reported on 03/23/2021 03/02/18   Ardis Hughs, MD   Allergies  Allergen Reactions   Penicillins Hives, Nausea And Vomiting, Swelling and Other (See Comments)    Tolerated Cefepime Has patient had a PCN reaction causing immediate rash, facial/tongue/throat swelling, SOB or lightheadedness with hypotension: YES Has patient had a PCN  reaction causing severe rash involving mucus membranes or skin necrosis: No Has patient had a PCN reaction that required hospitalization No Has patient had a PCN reaction occurring within the last 10 years: No If all of the above answers are "NO", then may proceed with Cephalosporin use.      Vital Signs: BP 133/67   Pulse 60   Temp 98.4 F (36.9 C)   Resp (!) 34   Ht 5' (1.524 m)   Wt 83.9 kg   SpO2 100%   BMI 36.13 kg/m  Pain Scale: 0-10   Pain Score: 0-No pain   SpO2: SpO2: 100 % O2 Device:SpO2: 100 % O2 Flow Rate: .O2 Flow Rate (L/min): 2 L/min  IO: Intake/output summary: No intake or output data in the 24 hours ending 03/23/21 1428  LBM:   Baseline Weight: Weight: 83.9 kg Most recent weight: Weight: 83.9 kg     Palliative Assessment/Data:     Time In: 1:15pm Time Out: 2:30pm Time Total: 75 minutes Greater than 50% of this time was spent in counseling and coordinating care related to the above assessment and plan.  Dorthy Cooler, PA-C Palliative Medicine Team Team phone # [phone]  Thank you for allowing the Palliative Medicine Team to assist in the care of this patient. Please utilize secure chat with additional questions, if there is no response within 30 minutes please call the above phone number.  Palliative Medicine Team providers are available by phone from 7am to 7pm daily and can be reached through the team cell phone.  Should this patient require assistance outside of these hours, please call the patient's attending physician.

## 2021-03-23 NOTE — Interval H&P Note (Signed)
History and Physical Interval Note:  03/23/2021 1:26 PM  [name]  has presented today for surgery, with the diagnosis of heart block.  The various methods of treatment have been discussed with the patient and family. After consideration of risks, benefits and other options for treatment, the patient has consented to  Procedure(s): TEMPORARY PACEMAKER (N/A) as a surgical intervention.  The patient's history has been reviewed, patient examined, no change in status, stable for surgery.  I have reviewed the patient's chart and labs.  Questions were answered to the patient's satisfaction.     Collier Salina [facility] 03/23/2021 1:26 PM

## 2021-03-23 NOTE — H&P (View-Only) (Signed)
Cardiology Consultation:   Patient ID: DREWEY BEGUE MRN: [id number]; DOB: [birth year]  Admit date: 03/23/2021 Date of Consult: 03/23/2021  PCP:  Janifer Adie, MD   [facility] Providers Cardiologist:  Mertie Moores, MD   {    Patient Profile:   [name] is a 65 y.o. male with a hx of paroxysmal atrial fibrillation on low-dose Eliquis, tachybradycardia syndrome, chronic systolic and diastolic heart failure, hypertension, hyperlipidemia, developmental delayed, COPD, bilateral carotid stenosis with left carotid enterectomy 2017, ischemic CVAs, type 2 diabetes, tobacco abuse, CKD stage III, chronic anemia, chronic abdominal mass, who is being seen 03/23/2021 for the evaluation of syncope and bradycardia at the request of Dr. Jeanell Sparrow.   History of Present Illness:   [name] follows Dr Lovena Le outpatient for PAF.   He was admitted 09/15/2017 to 10/26/2017 tachybradycardia syndrome, had a temporary PPM placed 09/15/2017, at the time had plans for permanent pacemaker, was told to avoid beta-blocker at DC. He was last seen on 03/27/20 in the office, maintained in NSR on amiodarone, had no further symptomatic pauses from sinus node dysfunction perspective.  It appears that he is on metoprolol XL 8m daily per [facility] med list. Lat Myoview 03/07/16 was low risk with prior MI, no reversible ischemia.   Patient is a poor historian with underlying mental delay.  His niece is at bedside to assist history.  His healthcare proxy is his sister.  Niece states that they have received a phone call today from his doctor stating he is diagnosed with pneumonia.   Per ER provider reports, patient was attending [employer] day[employer], witnessed with a syncope episode while on the bus.  EMS was called, witnessed patient passing out again while walking with his walker.  He was noted bradycardic and hypoxic, required brief chest compression with spontaneous recovery of consciousness.  Upon arrival to ED, he had required  >10 times of CPR due to frequent unconsciousness/asystole episodes. He is currently on epinephrine drip with heart rate at 30s.  He is arousable, able to answer yes and no question, cognitively impaired  Cardiology is consulted for further input.   Niece reports that patient has been sick for a long time, goal of care discussion has not really occurred. Patient regularly attends adult day care, ambulate with a walker. PPM was discussed at on point by his cardiologist. He has numerous skin tears chronically. He has a large abdominal tumor that has been there for 40 years. He is cognitively impaired and does not follow directions well at baseline. Niece is interested talking to palliative care.   Patient states his chest wall is hurting from CPR earlier, he reports mild cough, denied fever, chills. He is not able to answer more questions and fall asleep during encounter.       Past Medical History:  Diagnosis Date   Abdominal hernia    Chronic, not a good surgical candidate   Abscess, abdomen 12/31/2010   Referred to Wound Care in 01/2011 because of multiple abd abscess with VERY large ventral hernia (please look at image of CT abd/pelvis 09/2010).  Because of hernia I was hesitant to I&D.       Anemia    History of Iron Def Anemia   Anxiety    AVM (arteriovenous malformation)    chronic GI blood loss   Carotid artery disease (HCC)    s/p CEA   Chronic diastolic CHF (congestive heart failure) (HCC)    takes Lasix   Chronic low back  pain    COPD (chronic obstructive pulmonary disease) (HCC)    CVA (cerebral infarction) 09/2010   Bilateral with Left > Right   Diabetes mellitus    GERD (gastroesophageal reflux disease)    History of nuclear stress test    Myoview 9/16:  Inferior, apical and inf-lateral ischemia; not gated; High Risk   Hx of echocardiogram    Echo 5/16:  Mild LVH, EF 55%, indeterm. diast function, WMA could not be ruled out, MAC, trivial MR, mild LAE, normal RVF //  b. Echo  4/17: EF 55-60%, no RWMA, Gr 2 DD, Ao sclerosis, MAC, mild MS, mild LAE, PASP 55 mHg   Hyperlipidemia    Hypertension    Intellectual disability    Sister helps to take care of him and takes him to appts   Itching    all over body; pt scratches and has sores on bilateral arms and abdomen   Lung nodule    Myocardial infarction [facility]) 2016 ?   Heart attack  (  Per  pt. )   Obesity    Oxygen dependent    wears 2 liters at bedtime and when needed   PAF (paroxysmal atrial fibrillation) ([place]) 06/2009   CHADS score 2 (HTN, DM) // Pradaxa for Afib // Pradaxa stopped due to worsening anemia (Hgb in 5/18 8.5 >> Pradaxa remains on hold)    Pneumonia    hx of   Stroke ([place])    Tobacco user    Smokes 1ppd for multiple years.  Quit after hosp 09/2010.   Tubular adenoma of colon    Wears glasses     Past Surgical History:  Procedure Laterality Date   AMPUTATION Right 03/29/2013   Procedure: AMPUTATION RAY;  Surgeon: Newt Minion, MD;  Location: [place];  Service: Orthopedics;  Laterality: Right;  Right Foot 3rd and Possible 4th Ray Amputation   AMPUTATION Right 04/23/2013   Procedure: AMPUTATION RIGHT MID-FOOT;  Surgeon: Newt Minion, MD;  Location: [place];  Service: Orthopedics;  Laterality: Right;   arm surgery Left    as a child   CARDIAC CATHETERIZATION N/A 04/30/2015   Procedure: Left Heart Cath and Coronary Angiography;  Surgeon: Belva Crome, MD;  Location: [place] [facility];  Service: Cardiovascular;  Laterality: N/A;   Carotid arteriogram  10/2010   30% right ICA stenosis, 40% left ICA stenosis    CAROTID ENDARTERECTOMY Left 08-13-15   CEA   CATARACT EXTRACTION, BILATERAL     COLONOSCOPY WITH PROPOFOL N/A 06/10/2014   Procedure: COLONOSCOPY WITH PROPOFOL;  Surgeon: Jerene Bears, MD;  Location: [facility];  Service: Gastroenterology;  Laterality: N/A;   CORONARY ARTERY BYPASS GRAFT N/A 08/13/2015   Procedure: CORONARY ARTERY BYPASS GRAFTING (CABG), ON PUMP, TIMES THREE, USING LEFT INTERNAL  MAMMARY ARTERY, RIGHT GREATER SAPHENOUS VEIN HARVESTED ENDOSCOPICALLY;  Surgeon: [name], MD;  Location: [place];  Service: Open Heart Surgery;  Laterality: N/A;   DEBRIDMENT OF DECUBITUS ULCER Right 02/13/2013   ENDARTERECTOMY Left 08/13/2015   Procedure: ENDARTERECTOMY CAROTID;  Surgeon: Angelia Mould, MD;  Location: [place];  Service: Vascular;  Laterality: Left;   ESOPHAGOGASTRODUODENOSCOPY (EGD) WITH PROPOFOL N/A 06/10/2014   Procedure: ESOPHAGOGASTRODUODENOSCOPY (EGD) WITH PROPOFOL;  Surgeon: Jerene Bears, MD;  [facility]: [facility];  Service: Gastroenterology;  Laterality: N/A;   GIVENS CAPSULE STUDY N/A 07/09/2014   Procedure: GIVENS CAPSULE STUDY;  Surgeon: Jerene Bears, MD;  Location: [facility];  Service: Gastroenterology;  Laterality: N/A;  I & D EXTREMITY Right 02/13/2013   Procedure: IRRIGATION AND DEBRIDEMENT FOOT ULCER;  Surgeon: Johnny Bridge, MD;  Location: [place];  Service: Orthopedics;  Laterality: Right;  PULSE LAVAGE   INSERTION OF DIALYSIS CATHETER N/A 10/02/2017   Procedure: INSERTION OF TUNNELED DIALYSIS CATHETER;  Surgeon: Elam Dutch, MD;  Location: [facility];  Service: Vascular;  Laterality: N/A;   IR REMOVAL TUN CV CATH W/O FL  10/25/2017   MULTIPLE TOOTH EXTRACTIONS     TEE WITHOUT CARDIOVERSION N/A 08/13/2015   Procedure: TRANSESOPHAGEAL ECHOCARDIOGRAM (TEE);  Surgeon: [name], MD;  Location: [place];  Service: Open Heart Surgery;  Laterality: N/A;   TEMPORARY PACEMAKER N/A 09/15/2017   Procedure: TEMPORARY PACEMAKER;  Surgeon: Jettie Booze, MD;  Location: [place] [facility];  Service: Cardiovascular;  Laterality: N/A;   TRANSESOPHAGEAL ECHOCARDIOGRAM  09/2010   No ASD or PFO. EF 60-65%.  Normal systolic function. No evidence of thrombus.    TRANSTHORACIC ECHOCARDIOGRAM  09/2010    The cavity size was normal. Systolic function was vigorous.  EF 65-70%.  Normal wall funciton.    ULTRASOUND GUIDANCE FOR VASCULAR ACCESS  09/15/2017   Procedure:  Ultrasound Guidance For Vascular Access;  Surgeon: Jettie Booze, MD;  Location: [place] [facility];  Service: Cardiovascular;;     Home Medications:  Prior to Admission medications   Medication Sig Start Date End Date Taking? Authorizing Provider  acetaminophen (TYLENOL) 325 MG tablet Take 650 mg by mouth 3 (three) times daily as needed for mild pain.   Yes [provider]  apixaban (ELIQUIS) 2.5 MG TABS tablet Take 2.5 mg by mouth 2 (two) times daily.   Yes [provider]  aspirin 81 MG EC tablet Take 81 mg by mouth daily.   Yes [provider]  atorvastatin (LIPITOR) 80 MG tablet Take 1 tablet (80 mg total) by mouth daily. 10/26/17  Yes Colbert Ewing, MD  calcium carbonate (TUMS - DOSED IN MG ELEMENTAL CALCIUM) 500 MG chewable tablet Chew 1 tablet by mouth 3 (three) times daily as needed for indigestion or heartburn.   Yes [provider]  Cholecalciferol (VITAMIN D3) 50 MCG (2000 UT) TABS Take 50 mcg by mouth daily.   Yes [provider]  Darbepoetin Alfa (ARANESP) 150 MCG/0.3ML SOSY injection Inject 0.3 mLs (150 mcg total) into the skin every Wednesday at 6 PM. Patient taking differently: Inject 150 mcg into the skin See admin instructions. Every 8 weeks 11/01/17  Yes Colbert Ewing, MD  furosemide (LASIX) 80 MG tablet Take 1 tablet (80 mg total) by mouth 2 (two) times daily as needed for fluid. 10/26/17 05/15/21 Yes Colbert Ewing, MD  HYDROcodone-acetaminophen (NORCO/VICODIN) 5-325 MG tablet Take 1 tablet by mouth daily.   Yes [provider]  insulin glargine (LANTUS) 100 UNIT/ML injection Inject 0.14 mLs (14 Units total) into the skin at bedtime. Patient taking differently: Inject 26 Units into the skin daily. 10/26/17  Yes Colbert Ewing, MD  ipratropium-albuterol (DUONEB) 0.5-2.5 (3) MG/3ML SOLN Take 3 mLs by nebulization 2 (two) times daily as needed (COPD).   Yes [provider]  metoprolol succinate (TOPROL-XL) 25  MG 24 hr tablet Take 25 mg by mouth daily.   Yes [provider]  Multiple Vitamin (MULTIVITAMIN) capsule Take 1 capsule by mouth daily.   Yes [provider]  nitroGLYCERIN (NITROSTAT) 0.4 MG SL tablet Place 0.4 mg under the tongue every 5 (five) minutes as needed for chest pain.  Yes [provider]  nystatin (MYCOSTATIN/NYSTOP) powder Apply topically 4 (four) times daily. Apply to inguinal area/scrotum and under pannus Patient taking differently: Apply 1 application topically in the morning, at noon, and at bedtime. To skin folds to treat intertrigo 03/02/18  Yes Ardis Hughs, MD  OXYGEN Inhale 2.5 L into the lungs See admin instructions. As needed and at bedtime for shortness of breath   Yes [provider]  pantoprazole (PROTONIX) 40 MG tablet Take 40 mg by mouth daily.   Yes [provider]  PARoxetine (PAXIL) 40 MG tablet Take 40 mg by mouth daily.    Yes [provider]  polyvinyl alcohol (LIQUIFILM TEARS) 1.4 % ophthalmic solution Place 1 drop into both eyes 2 (two) times daily as needed for dry eyes. Natural Tears   Yes [provider]  senna-docusate (SENOKOT-S) 8.6-50 MG tablet Take 1 tablet by mouth at bedtime as needed for mild constipation. 10/26/17  Yes Colbert Ewing, MD  tiotropium (SPIRIVA) 18 MCG inhalation capsule Place 18 mcg into inhaler and inhale daily.   Yes [provider]  triamcinolone cream (KENALOG) 0.1 % Apply 1 application topically 2 (two) times daily as needed (legs and other itchy areas).   Yes [provider]  amiodarone (PACERONE) 200 MG tablet Take one tablet by mouth daily Monday through Saturday. Do NOT take on Sunday. Patient not taking: Reported on 03/23/2021 06/11/19   Evans Lance, MD  multivitamin (RENA-VIT) TABS tablet Take 1 tablet by mouth at bedtime. Patient not taking: Reported on 03/23/2021 10/26/17   Colbert Ewing, MD  predniSONE (DELTASONE) 10 MG tablet Take  10-40 mg by mouth See admin instructions. 33m daily for 3 days, then 267mdaily for 3 days, then 1055maily for 3 days. Then stop.    [provider]  risperiDONE (RISPERDAL) 0.5 MG tablet Take 0.5 mg by mouth 2 (two) times daily. Patient not taking: Reported on 03/23/2021    [provider]  traMADol (ULTRAM) 50 MG tablet Take 1-2 tablets (50-100 mg total) by mouth every 6 (six) hours as needed for moderate pain. Patient not taking: Reported on 03/23/2021 03/02/18   HerArdis HughsD    Inpatient Medications: Scheduled Meds:  Continuous Infusions:  epinephrine 4.5 mcg/min (03/23/21 1102)   PRN Meds:   Allergies:    Allergies  Allergen Reactions   Penicillins Hives, Nausea And Vomiting, Swelling and Other (See Comments)    Tolerated Cefepime Has patient had a PCN reaction causing immediate rash, facial/tongue/throat swelling, SOB or lightheadedness with hypotension: YES Has patient had a PCN reaction causing severe rash involving mucus membranes or skin necrosis: No Has patient had a PCN reaction that required hospitalization No Has patient had a PCN reaction occurring within the last 10 years: No If all of the above answers are "NO", then may proceed with Cephalosporin use.     Social History:   Social History   Socioeconomic History   Marital status: Single    Spouse name: Not on file   Number of children: 0   Years of education: Not on file   Highest education level: Not on file  Occupational History    Employer: DISABLED  Tobacco Use   Smoking status: Former    Packs/day: 1.00    Years: 42.00    Pack years: 42.00    Types: Cigarettes, Pipe    Quit date: 11/10/2010    Years since quitting: 10.3   Smokeless tobacco: Former  Quit date: 10/02/2010  Vaping Use   Vaping Use: Never used  Substance and Sexual Activity   Alcohol use: No    Alcohol/week: 0.0 standard drinks    Comment: " last drink was 2003" 08/11/15   Drug use: No   Sexual  activity: Not on file  Other Topics Concern   Not on file  Social History Narrative   Released from prison in 2003 after serving 2 years for indecency with a minor.    He smokes cigarettes greater than 30 years, also smoked a pipe.  Quit tobacco 09/2010.   Denies EtOH and drugs.    Social Determinants of Health   Financial Resource Strain: Not on file  Food Insecurity: Not on file  Transportation Needs: Not on file  Physical Activity: Not on file  Stress: Not on file  Social Connections: Not on file  Intimate Partner Violence: Not on file    Family History:    Family History  Problem Relation Age of Onset   Heart disease Mother    Hypertension Sister    Heart disease Brother    Heart disease Father    Peripheral vascular disease Father    Heart disease Maternal Grandmother    Heart attack Maternal Grandmother    Breast cancer Maternal Grandmother    Stomach cancer Maternal Uncle    Colon cancer Neg Hx    Stroke Neg Hx      ROS:  Unable to obtain due to cognitive impairment   Physical Exam/Data:   Vitals:   03/23/21 1054 03/23/21 1055 03/23/21 1059 03/23/21 1100  BP: (!) 102/49  (!) 86/56 (!) 89/50  Pulse: (!) 43  (!) 39 (!) 37  Resp: _0 Temp: 98.1 F (36.7 C)     TempSrc: Axillary     SpO2: 100%  100% 100%  Weight:  83.9 kg    Height:  5' (1.524 m)     No intake or output data in the 24 hours ending 03/23/21 1104 Last 3 Weights 03/23/2021 09/23/2020 03/27/2020  Weight (lbs) 185 lb 178 lb 194 lb  Weight (kg) 83.915 kg 80.74 kg 87.998 kg     Body mass index is 36.13 kg/m.   Vitals:  Vitals:   03/23/21 1204 03/23/21 1207  BP: 119/60 (!) 131/58  Pulse:    Resp: (!) 39 (!) 22  Temp: 98.2 F (36.8 C) 98.2 F (36.8 C)  SpO2:  100%   General Appearance: In no apparent distress, laying in bed, older than his age, chronically ill HEENT: Normocephalic, atraumatic.  Neck: Neck is short and thick, difficult assess JVD Cardiovascular: Irregularly  irregular, no murmur Respiratory: Resting breathing unlabored, lungs sounds clear but diminished at base to auscultation bilaterally, on NRB at 100% with pox 100% Gastrointestinal: Large abdominal mass noted to the right  Extremities: s/p right foot partial amputation, trace edema of BLE Genitourinary: Foley is draining cloudy urine  Musculoskeletal: Generalized muscular atrophy  Skin: Numerous skin tears and bruise of abdominal and extremities   Neurologic: Lethargic, oriented to self. Dysarthria, answered yes and no questions, cognitively impaired, not follow commands Psychiatric: calm    EKG:  The EKG was personally reviewed and demonstrates:  EKG at 03/23/21 at 10:16 AM Complete heart block, rate of 20s.   Telemetry:  Telemetry was personally reviewed and demonstrates:  A fib with rate  of 90-120s with RBBB, was in ventricular escape rhythm with rate of 30-40s prior   Relevant CV Studies:  Echo from 09/18/2017:  - Left ventricle: The cavity size was normal. Wall thickness was    increased in a pattern of mild LVH. Indeterminant diastolic    function, atrial fibrillation. Systolic function was mildly to    moderately reduced. The estimated ejection fraction was in the    range of 40% to 45%. Diffuse hypokinesis.  - Aortic valve: There was no stenosis.  - Mitral valve: Moderately calcified annulus. There was no    significant regurgitation.  - Left atrium: The atrium was mildly dilated.  - Right ventricle: The cavity size was mildly to moderately    dilated. Pacer wire or catheter noted in right ventricle.    Systolic function was mildly reduced.  - Right atrium: The atrium was mildly dilated.  - Tricuspid valve: Peak RV-RA gradient (S): 32 mm Hg.  - Pulmonary arteries: PA peak pressure: 35 mm Hg (S).  - Inferior vena cava: The vessel was normal in size. The    respirophasic diameter changes were in the normal range (>= 50%),    consistent with normal central venous pressure.    Impressions:   - The patient was in atrial fibrillation. Normal LV size with mild    LV hypertrophy. EF 40-45%. Mild-moderate RV dilation with mildly    decreased systolic function. Borderline pulmonary hypertension.   09/15/2017 temporary pacemaker:  He was last seen on 03/27/20 in the office, maintained in NSR on amiodarone, had no further symptomatic pauses from sinus node dysfunction perspective.  Laboratory Data:  High Sensitivity Troponin:  No results for input(s): TROPONINIHS in the last 720 hours.   Chemistry Recent Labs  Lab 03/23/21 1034  NA 130*  K 4.5  CL 102  GLUCOSE 361*  BUN 61*  CREATININE 2.50*    No results for input(s): PROT, ALBUMIN, AST, ALT, ALKPHOS, BILITOT in the last 168 hours. Hematology Recent Labs  Lab 03/23/21 1029 03/23/21 1034  WBC 16.6*  --   RBC 3.70*  --   HGB 11.3* 11.2*  HCT 35.4* 33.0*  MCV 95.7  --   MCH 30.5  --   MCHC 31.9  --   RDW 16.4*  --   PLT 345  --    BNPNo results for input(s): BNP, PROBNP in the last 168 hours.  DDimer No results for input(s): DDIMER in the last 168 hours.   Radiology/Studies:  DG Chest 2 View  Result Date: 03/22/2021 CLINICAL DATA:  Decreased breath sounds at right lung base. Chest pain with nonproductive cough for 2 weeks. EXAM: CHEST - 2 VIEW COMPARISON:  Radiograph 06/30/2020. FINDINGS: Similar low lung volumes. Patient is post median sternotomy. Similar cardiomegaly with aortic atherosclerosis and tortuosity there is patchy airspace disease at the right lung base, new from prior exam. Background peribronchial and interstitial thickening is similar. Minor subsegmental atelectasis at the left lung base. Suspected small right pleural effusion. No pneumothorax. Scoliotic curvature of the spine. IMPRESSION: 1. Patchy airspace disease at the right lung base, suspicious for pneumonia. Followup PA and lateral chest X-ray is recommended in 3-4 weeks following trial of antibiotic therapy to ensure resolution  and exclude underlying malignancy. 2. Suspected small right pleural effusion. 3. Stable cardiomegaly. Stable mild diffuse interstitial and bronchial thickening suggesting chronic lung disease, possible COPD. Electronically Signed   By: Keith Rake M.D.   On: 03/22/2021 23:51   DG Chest Portable 1 View  Result Date: 03/23/2021 CLINICAL DATA:  Status post CPR. EXAM: PORTABLE CHEST 1 VIEW COMPARISON:  Chest x-ray  from yesterday. FINDINGS: Unchanged mild cardiomegaly status post CABG. Increasing heterogeneous interstitial opacity in the left mid lung. Improved aeration at the right lung base. Unchanged small right pleural effusion. No pneumothorax. No acute osseous abnormality. IMPRESSION: 1. Increasing heterogeneous interstitial opacity in the left mid lung, likely mild asymmetric pulmonary edema. 2. Improved aeration at the right lung base. 3. Unchanged small right pleural effusion. Electronically Signed   By: Titus Dubin M.D.   On: 03/23/2021 10:47     Assessment and Plan:   Syncope  Asystolic arrest Complete heart block  - presented with recurrent syncope, asystole, in the setting of CHB, required multiple times brief CPR by EMS and ED with spontaneous recover of consciousness - currently on epi gtt, telemetry with A fib rate of 90-120s, BP stable - discussed goals of care with patient Niece at bedside, family interested speaking with palliative care and has a general understanding of how chronically ill he is and he may not necessarily be a good candidate for PPM due to underlying cognitive impairment as well as chronic complex comorbidities - palliative care consult requested, pending call back -EP consult requested, will make recommendation of PPM  - Place Z[place]s near by, stop Metoprolol XL, continue telemetry monitor, ICU admission to PCCM is recommended   Paroxysmal atrial fibrillation Tachy-brady syndrome  - currently in A fib, rate is fairly controlled  - avoid AV nodal blocking  agent due to above  - EP to see  - May resume Eliquis if no contraindication  Acute hypoxic respiratory failure Sepsis Community-acquired pneumonia Hyponatremia Type 2 diabetes with hyperglycemia CKD stage IV with anion gap metabolic acidosis Chronic abdominal mass Numerous skin tears COPD Hypertension Hyperlipidemia GERD Developmental disability Bilateral carotid stenosis Hx of CVAs -Defer management to primary team   Risk Assessment/Risk Scores:   For questions or updates, please contact [facility] Please consult [email or web address] for contact info under    Signed, Margie Billet, NP  03/23/2021 11:04 AM  I have examined the patient and reviewed assessment and plan and discussed with patient.  Agree with above as stated.    Asked to see the patient emergently due to recurrent asystolic events.  He required CPR for several bradycardia arrests.  Epinephrine drip was started which helped keep his heart rate higher.  Most notably on exam, he has a large right-sided abdominal tumor.  Per the niece who is in the room at bedside, this has been a longstanding issue.  He also has several areas of skin breakdown.  In looking at the right groin area, there is some skin breakdown and likely some fungal infection.  Per the niece, hygiene is an issue.  We discussed the issue of bradycardia causing syncope and the balance between using rate slowing drugs to stop the fast heartbeats.  We also spoke at length about permanent pacemaker as a treatment for this and the multiple medical comorbidities that make this treatment difficult.  Compliance issues with the arm movement limitations will be difficult immediately postprocedure.  Multiple areas of skin breakdown and infection issues also will make things difficult.  Initially, the epinephrine was helping his heart rate.  We discussed titrating that to try to keep his heart rate between 60 and 100.  His bradycardic arrest recurred unfortunately.   Ultimately, I spoke with critical care who in consultation with the family, came up with a plan of 24 to 48-hour bridge with a temporary pacemaker.  If his heart rate does not improve at that point,  they would reassess goals of care at that time.  CRITICAL CARE Performed by: Larae Grooms   Total critical care time: 40 minutes  Critical care time was exclusive of separately billable procedures and treating other patients.  Critical care was necessary to treat or prevent imminent or life-threatening deterioration.  Critical care was time spent personally by me on the following activities: development of treatment plan with patient and/or surrogate as well as nursing, discussions with consultants, evaluation of patient's response to treatment, examination of patient, obtaining history from patient or surrogate, ordering and performing treatments and interventions, ordering and review of laboratory studies, ordering and review of radiographic studies, pulse oximetry and re-evaluation of patient's condition.   Larae Grooms

## 2021-03-23 NOTE — ED Provider Notes (Signed)
[facility] EMERGENCY DEPARTMENT Provider Note   CSN: [id number] Arrival date & time: 03/23/21  1007     History Chief Complaint  Patient presents with   Bradycardia    [name] is a 65 y.o. male.  HPI Level 5 caveat secondary to patient's inability to give history due to critical illness and developmental delay 65 year old male transported via [facility] with report of episodes of syncope.  Patient has a history of mild developmental delay, A. fib RVR, COPD, diabetes reported diagnosis of right lower lobe pneumonia yesterday.  EMS reports he was [facility] with his rolling walker when he had an episode of unresponsiveness.  Upon their arrival he had several episodes of clonic jerks with asystole on monitor.  He had at least 1 episode of chest compressions and then would come back with pulse and become responsive.  No IV access obtained prior to ED arrival, hence no medications given, blood sugar checked and 350.    Past Medical History:  Diagnosis Date   Abdominal hernia    Chronic, not a good surgical candidate   Abscess, abdomen 12/31/2010   Referred to Wound Care in 01/2011 because of multiple abd abscess with VERY large ventral hernia (please look at image of CT abd/pelvis 09/2010).  Because of hernia I was hesitant to I&D.       Anemia    History of Iron Def Anemia   Anxiety    AVM (arteriovenous malformation)    chronic GI blood loss   Carotid artery disease (HCC)    s/p CEA   Chronic diastolic CHF (congestive heart failure) (HCC)    takes Lasix   Chronic low back pain    COPD (chronic obstructive pulmonary disease) (HCC)    CVA (cerebral infarction) 09/2010   Bilateral with Left > Right   Diabetes mellitus    GERD (gastroesophageal reflux disease)    History of nuclear stress test    Myoview 9/16:  Inferior, apical and inf-lateral ischemia; not gated; High Risk   Hx of echocardiogram    Echo 5/16:  Mild LVH,  EF 55%, indeterm. diast function, WMA could not be ruled out, MAC, trivial MR, mild LAE, normal RVF //  b. Echo 4/17: EF 55-60%, no RWMA, Gr 2 DD, Ao sclerosis, MAC, mild MS, mild LAE, PASP 55 mHg   Hyperlipidemia    Hypertension    Intellectual disability    Sister helps to take care of him and takes him to appts   Itching    all over body; pt scratches and has sores on bilateral arms and abdomen   Lung nodule    Myocardial infarction [facility]) 2016 ?   Heart attack  (  Per  pt. )   Obesity    Oxygen dependent    wears 2 liters at bedtime and when needed   PAF (paroxysmal atrial fibrillation) ([place]) 06/2009   CHADS score 2 (HTN, DM) // Pradaxa for Afib // Pradaxa stopped due to worsening anemia (Hgb in 5/18 8.5 >> Pradaxa remains on hold)    Pneumonia    hx of   Stroke ([place])    Tobacco user    Smokes 1ppd for multiple years.  Quit after hosp 09/2010.   Tubular adenoma of colon    Wears glasses     Patient Active Problem List   Diagnosis Date Noted   Fluid overload    Hyponatremia    Anemia of  chronic disease    Pressure injury of skin 09/26/2017   AKI (acute kidney injury) ([place])    Aspiration pneumonia of both lower lobes (HCC)    Influenza with respiratory manifestation    Tachy-brady syndrome (HCC)    Sinus pause    Intertrigo    Right bundle branch block (RBBB) on electrocardiography    Dysphagia    Sepsis ([place]) 02/13/2016   Ischemic cardiomyopathy 09/17/2015   CAD (coronary artery disease) 07/14/2015   Chronic combined systolic and diastolic CHF (congestive heart failure) ([place]) 07/14/2015   Abrasion of abdominal wall 10/26/2014   Acute respiratory failure with hypoxia ([place]) 10/26/2014   Gastritis and gastroduodenitis 06/10/2014   Benign neoplasm of ascending colon 06/10/2014   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 ([place]) 02/14/2013   Wound, open, foot 12/13/2012   IDA (iron deficiency anemia) 12/13/2012   Hyperkalemia 05/22/2012   Diabetic leg ulcer  ([place]) 05/15/2012   Routine health maintenance 05/15/2012   Leg weakness, bilateral 12/10/2011   Carotid artery disease ([place]) 08/31/2011   Sciatica 05/22/2011   Ventral hernia without obstruction or gangrene 01/24/2011   Incidental lung nodule, greater than or equal to 76m 11/24/2010   CVA (cerebral vascular accident) ([place] 10/24/2010   PAF (paroxysmal atrial fibrillation) ([place] 07/24/2009   Anxiety state 06/22/2007   DM (diabetes mellitus), type 2, uncontrolled ([place] 12/22/2006   Essential hypertension 12/22/2006   HYPERCHOLESTEROLEMIA 10/05/2006   TOBACCO DEPENDENCE 10/05/2006   Unspecified intellectual disabilities 10/05/2006   COPD (chronic obstructive pulmonary disease) ([place] 10/05/2006    Past Surgical History:  Procedure Laterality Date   AMPUTATION Right 03/29/2013   Procedure: AMPUTATION Coda Filler;  Surgeon: MNewt Minion MD;  [facility]: [facility]  Service: Orthopedics;  Laterality: Right;  Right Foot 3rd and Possible 4th Allena Pietila Amputation   AMPUTATION Right 04/23/2013   Procedure: AMPUTATION RIGHT MID-FOOT;  Surgeon: MNewt Minion MD;  [place] [facility]  Service: Orthopedics;  Laterality: Right;   arm surgery Left    as a child   CARDIAC CATHETERIZATION N/A 04/30/2015   Procedure: Left Heart Cath and Coronary Angiography;  Surgeon: HBelva Crome MD;  [facility];  Service: Cardiovascular;  Laterality: N/A;   Carotid arteriogram  10/2010   30% right ICA stenosis, 40% left ICA stenosis    CAROTID ENDARTERECTOMY Left 08-13-15   CEA   CATARACT EXTRACTION, BILATERAL     COLONOSCOPY WITH PROPOFOL N/A 06/10/2014   Procedure: COLONOSCOPY WITH PROPOFOL;  Surgeon: JJerene Bears MD;  [facility];  Service: Gastroenterology;  Laterality: N/A;   CORONARY ARTERY BYPASS GRAFT N/A 08/13/2015   Procedure: CORONARY ARTERY BYPASS GRAFTING (CABG), ON PUMP, TIMES THREE, USING LEFT INTERNAL MAMMARY ARTERY, RIGHT GREATER SAPHENOUS VEIN HARVESTED ENDOSCOPICALLY;  Surgeon: BGaye Pollack  MD;  [place] [facility]  Service: Open Heart Surgery;  Laterality: N/A;   DEBRIDMENT OF DECUBITUS ULCER Right 02/13/2013   ENDARTERECTOMY Left 08/13/2015   Procedure: ENDARTERECTOMY CAROTID;  Surgeon: CAngelia Mould MD;  [place] [facility]  Service: Vascular;  Laterality: Left;   ESOPHAGOGASTRODUODENOSCOPY (EGD) WITH PROPOFOL N/A 06/10/2014   Procedure: ESOPHAGOGASTRODUODENOSCOPY (EGD) WITH PROPOFOL;  Surgeon: JJerene Bears MD;  [facility];  Service: Gastroenterology;  Laterality: N/A;   GIVENS CAPSULE STUDY N/A 07/09/2014   Procedure: GIVENS CAPSULE STUDY;  Surgeon: JJerene Bears MD;  [facility];  Service: Gastroenterology;  Laterality: N/A;   I & D EXTREMITY Right 02/13/2013   Procedure: IRRIGATION  AND DEBRIDEMENT FOOT ULCER;  Surgeon: Johnny Bridge, MD;  Location: [place];  Service: Orthopedics;  Laterality: Right;  PULSE LAVAGE   INSERTION OF DIALYSIS CATHETER N/A 10/02/2017   Procedure: INSERTION OF TUNNELED DIALYSIS CATHETER;  Surgeon: Elam Dutch, MD;  Location: [facility] OR;  Service: Vascular;  Laterality: N/A;   IR REMOVAL TUN CV CATH W/O FL  10/25/2017   MULTIPLE TOOTH EXTRACTIONS     TEE WITHOUT CARDIOVERSION N/A 08/13/2015   Procedure: TRANSESOPHAGEAL ECHOCARDIOGRAM (TEE);  Surgeon: [name], MD;  Location: [place];  Service: Open Heart Surgery;  Laterality: N/A;   TEMPORARY PACEMAKER N/A 09/15/2017   Procedure: TEMPORARY PACEMAKER;  Surgeon: Jettie Booze, MD;  Location: [place] [facility];  Service: Cardiovascular;  Laterality: N/A;   TRANSESOPHAGEAL ECHOCARDIOGRAM  09/2010   No ASD or PFO. EF 60-65%.  Normal systolic function. No evidence of thrombus.    TRANSTHORACIC ECHOCARDIOGRAM  09/2010    The cavity size was normal. Systolic function was vigorous.  EF 65-70%.  Normal wall funciton.    ULTRASOUND GUIDANCE FOR VASCULAR ACCESS  09/15/2017   Procedure: Ultrasound Guidance For Vascular Access;  Surgeon: Jettie Booze, MD;  Location: [place] [facility]  LAB;  Service: Cardiovascular;;       Family History  Problem Relation Age of Onset   Heart disease Mother    Hypertension Sister    Heart disease Brother    Heart disease Father    Peripheral vascular disease Father    Heart disease Maternal Grandmother    Heart attack Maternal Grandmother    Breast cancer Maternal Grandmother    Stomach cancer Maternal Uncle    Colon cancer Neg Hx    Stroke Neg Hx     Social History   Tobacco Use   Smoking status: Former    Packs/day: 1.00    Years: 42.00    Pack years: 42.00    Types: Cigarettes, Pipe    Quit date: 11/10/2010    Years since quitting: 10.3   Smokeless tobacco: Former    Quit date: 10/02/2010  Vaping Use   Vaping Use: Never used  Substance Use Topics   Alcohol use: No    Alcohol/week: 0.0 standard drinks    Comment: " last drink was 2003" 08/11/15   Drug use: No    Home Medications Prior to Admission medications   Medication Sig Start Date End Date Taking? Authorizing Provider  acetaminophen (TYLENOL) 325 MG tablet Take 650 mg by mouth 3 (three) times daily as needed for mild pain.    [provider]  Albuterol Sulfate 2.5 MG/0.5ML NEBU Inhale into the lungs.    [provider]  amiodarone (PACERONE) 200 MG tablet Take one tablet by mouth daily Monday through Saturday. Do NOT take on Sunday. 06/11/19   Evans Lance, MD  apixaban (ELIQUIS) 5 MG TABS tablet Take 5 mg by mouth 2 (two) times daily.    [provider]  aspirin 81 MG EC tablet Take by mouth.    [provider]  atorvastatin (LIPITOR) 80 MG tablet Take 1 tablet (80 mg total) by mouth daily. 10/26/17   Colbert Ewing, MD  calcium carbonate (TUMS - DOSED IN MG ELEMENTAL CALCIUM) 500 MG chewable tablet Chew 1 tablet by mouth 3 (three) times daily as needed for indigestion or heartburn.    [provider]  Cholecalciferol (VITAMIN D3) 1000 UNITS CAPS Take 1,000 Units by mouth daily.    [provider]   Darbepoetin Alfa (ARANESP) 150 MCG/0.3ML SOSY injection Inject 0.3 mLs (150 mcg total) into the skin every Wednesday at 6 PM. 11/01/17   Colbert Ewing, MD  furosemide (LASIX) 80 MG tablet Take 1 tablet (80 mg total) by mouth 2 (two) times daily as needed for fluid. 10/26/17 02/12/18  Colbert Ewing, MD  HYDROcodone-acetaminophen (NORCO/VICODIN) 5-325 MG tablet Take 1 tablet by mouth daily.    [provider]  insulin glargine (LANTUS) 100 UNIT/ML injection Inject 0.14 mLs (14 Units total) into the skin at bedtime. 10/26/17   Colbert Ewing, MD  ipratropium-albuterol (DUONEB) 0.5-2.5 (3) MG/3ML SOLN Take 3 mLs by nebulization 2 (two) times daily.    [provider]  Multiple Vitamin (MULTIVITAMIN) capsule Take 1 capsule by mouth daily.    [provider]  multivitamin (RENA-VIT) TABS tablet Take 1 tablet by mouth at bedtime. 10/26/17   Colbert Ewing, MD  nitroGLYCERIN (NITROSTAT) 0.4 MG SL tablet Place 0.4 mg under the tongue every 5 (five) minutes as needed for chest pain.    [provider]  nystatin (MYCOSTATIN/NYSTOP) powder Apply topically 4 (four) times daily. Apply to inguinal area/scrotum and under pannus 03/02/18   Ardis Hughs, MD  pantoprazole (PROTONIX) 40 MG tablet Take 40 mg by mouth daily.    [provider]  PARoxetine (PAXIL) 40 MG tablet Take 40 mg by mouth daily.     [provider]  polyvinyl alcohol (LIQUIFILM TEARS) 1.4 % ophthalmic solution Place 1 drop into both eyes 2 (two) times daily as needed for dry eyes. Natural Tears    [provider]  risperiDONE (RISPERDAL) 0.5 MG tablet Take 0.5 mg by mouth 2 (two) times daily.    [provider]  senna-docusate (SENOKOT-S) 8.6-50 MG tablet Take 1 tablet by mouth at bedtime as needed for mild constipation. 10/26/17   Colbert Ewing, MD  Skin Protectants, Misc. (EUCERIN) cream Apply 1 application topically 2 (two) times daily. TO AFFECTED AREAS    [provider]  tiotropium (SPIRIVA) 18 MCG inhalation capsule Place into inhaler and inhale.    [provider]  traMADol (ULTRAM) 50 MG tablet Take 1-2 tablets (50-100 mg total) by mouth every 6 (six) hours as needed for moderate pain. 03/02/18   Ardis Hughs, MD    Allergies    Penicillins  Review of Systems   Review of Systems  Unable to perform ROS: Acuity of condition   Physical Exam Updated Vital Signs There were no vitals taken for this visit.  Physical Exam Vitals and nursing note reviewed.  HENT:     Head: Normocephalic.     Right Ear: External ear normal.     Left Ear: External ear normal.     Nose: Nose normal.  Cardiovascular:     Rate and Rhythm: Bradycardia present.  Pulmonary:     Effort: Pulmonary effort is normal.  Abdominal:     Comments: Distended abdomen more to right consistent with history of hernia Skin breakdown  Musculoskeletal:     Cervical back: Normal range of motion.     Comments: Right partial foot amputation  Neurological:     Mental Status: He is alert.    ED Results / Procedures / Treatments   Labs (all labs ordered are listed, but only abnormal results are displayed) Labs Reviewed  LACTIC ACID, PLASMA  LACTIC ACID, PLASMA  CBC WITH DIFFERENTIAL/PLATELET  COMPREHENSIVE METABOLIC PANEL  I-STAT CHEM 8, ED  TROPONIN I (HIGH SENSITIVITY)  EKG EKG Interpretation  Date/Time:  Tuesday March 23 2021 10:46:54 EDT Ventricular Rate:  40 PR Interval:  51 QRS Duration: 166 QT Interval:  555 QTC Calculation: 453 R Axis:   269 Text Interpretation: Junctional rhythm Right bundle branch block Confirmed by Pattricia Boss [phone]) on 03/23/2021 11:12:36 AM  Radiology DG Chest 2 View  Result Date: 03/22/2021 CLINICAL DATA:  Decreased breath sounds at right lung base. Chest pain with nonproductive cough for 2 weeks. EXAM: CHEST - 2 VIEW COMPARISON:  Radiograph 06/30/2020. FINDINGS: Similar low lung volumes. Patient is post  median sternotomy. Similar cardiomegaly with aortic atherosclerosis and tortuosity there is patchy airspace disease at the right lung base, new from prior exam. Background peribronchial and interstitial thickening is similar. Minor subsegmental atelectasis at the left lung base. Suspected small right pleural effusion. No pneumothorax. Scoliotic curvature of the spine. IMPRESSION: 1. Patchy airspace disease at the right lung base, suspicious for pneumonia. Followup PA and lateral chest X-Nikhita Mentzel is recommended in 3-4 weeks following trial of antibiotic therapy to ensure resolution and exclude underlying malignancy. 2. Suspected small right pleural effusion. 3. Stable cardiomegaly. Stable mild diffuse interstitial and bronchial thickening suggesting chronic lung disease, possible COPD. Electronically Signed   By: Keith Rake M.D.   On: 03/22/2021 23:51    Procedures .Critical Care  Date/Time: 03/23/2021 12:11 PM Performed by: Pattricia Boss, MD Authorized by: Pattricia Boss, MD   Critical care provider statement:    Critical care time (minutes):  95   Critical care was necessary to treat or prevent imminent or life-threatening deterioration of the following conditions:  Cardiac failure, circulatory failure and respiratory failure   Critical care was time spent personally by me on the following activities:  Discussions with consultants, evaluation of patient's response to treatment, examination of patient, ordering and performing treatments and interventions, ordering and review of laboratory studies, ordering and review of radiographic studies, pulse oximetry, re-evaluation of patient's condition, obtaining history from patient or surrogate and review of old charts  COVID swab pending Medications Ordered in ED Medications  atropine 1 MG/10ML injection 1 mg (has no administration in time range)    ED Course  I have reviewed the triage vital signs and the nursing notes.  Pertinent labs & imaging  results that were available during my care of the patient were reviewed by me and considered in my medical decision making (see chart for details). Patient with multiple episodes of asystole and unresponsiveness requiring chest compressions with return of rhythm and patient regaining consciousness. Called sister who holds his power of attorney.  She wishes full code Patient received atropine and epi.  After first dose of atropine patient was still bradycardic.  He received epi.  With epi he had some sinus tachycardia and some episodes that appeared to be V. tach and then intermittent widening of the QRS with P waves noted I-STAT obtained sodium was sodium 130, potassium 4.5, BUN 61, creatinine 2.5 -patient has had abnormal creatinine for several years cardiology consult placed  Epi drip and glucagon ordered 11:09 AM Heart rate 40 Blood pressure 91/49 Patient responsive to verbal stimuli Chest x-Cono Gebhard reviewed and antibiotics ordered-Rocephin and Zithromax to cover community-acquired pneumonia Clinical Course as of 03/23/21 1230  Tue Mar 23, 2021  1124 Discussed with Dr. Stanford Breed [DR]  1125 Discussed with cardiology at bedside. Consulted critical care [DR]    Clinical Course User Index [DR] Pattricia Boss, MD    Review of [facility] of the [facility] medications reveal Toprol-XL 25  mg.  His niece is at the bedside.  She states that this to be the most accurate medication as the patient is given his medications from [facility]. MDM Rules/Calculators/A&P                          Patient with reactive arrhythmia/asystole with multiple episodes of chest compressions here in ED.  Patient with possible pneumonia.  Treated here with Rocephin and Zithromax.  Asystole treated with chest compressions, followed by atropine  followed by epi, epi drip in place, glucagon given.  Per his notes from [facility] he is on Toprol-XL.  This is not listed in his medications previously from current hospital.   Cardiology and critical care aware. Critical care is at bedside and patient is to be transferred to ICU.  Final Clinical Impression(s) / ED Diagnoses Final diagnoses:  Cardiac asystole ([place])  Chronic kidney disease, unspecified CKD stage  Hyperglycemia    Rx / DC Orders ED Discharge Orders     None        Pattricia Boss, MD 03/23/21 1235

## 2021-03-24 ENCOUNTER — Inpatient Hospital Stay (HOSPITAL_COMMUNITY): Payer: Medicare (Managed Care)

## 2021-03-24 ENCOUNTER — Encounter (HOSPITAL_COMMUNITY): Payer: Self-pay | Admitting: Cardiology

## 2021-03-24 DIAGNOSIS — I495 Sick sinus syndrome: Secondary | ICD-10-CM | POA: Diagnosis not present

## 2021-03-24 DIAGNOSIS — I442 Atrioventricular block, complete: Secondary | ICD-10-CM | POA: Diagnosis not present

## 2021-03-24 LAB — BASIC METABOLIC PANEL
Anion gap: 13 (ref 5–15)
Anion gap: 11 (ref 5–15)
BUN: 71 mg/dL — ABNORMAL HIGH (ref 8–23)
BUN: 71 mg/dL — ABNORMAL HIGH (ref 8–23)
CO2: 18 mmol/L — ABNORMAL LOW (ref 22–32)
CO2: 24 mmol/L (ref 22–32)
Calcium: 8.5 mg/dL — ABNORMAL LOW (ref 8.9–10.3)
Calcium: 8.5 mg/dL — ABNORMAL LOW (ref 8.9–10.3)
Chloride: 104 mmol/L (ref 98–111)
Chloride: 100 mmol/L (ref 98–111)
Creatinine, Ser: 2.64 mg/dL — ABNORMAL HIGH (ref 0.61–1.24)
Creatinine, Ser: 2.43 mg/dL — ABNORMAL HIGH (ref 0.61–1.24)
GFR, Estimated: 26 mL/min — ABNORMAL LOW (ref >60)
GFR, Estimated: 29 mL/min — ABNORMAL LOW (ref >60)
Glucose, Bld: 120 mg/dL — ABNORMAL HIGH (ref 70–99)
Glucose, Bld: 149 mg/dL — ABNORMAL HIGH (ref 70–99)
Potassium: 4 mmol/L (ref 3.5–5.1)
Potassium: 4.5 mmol/L (ref 3.5–5.1)
Sodium: 135 mmol/L (ref 135–145)
Sodium: 135 mmol/L (ref 135–145)

## 2021-03-24 LAB — HEPATIC FUNCTION PANEL
ALT: 23 U/L (ref 0–44)
AST: 52 U/L — ABNORMAL HIGH (ref 15–41)
Albumin: 2.2 g/dL — ABNORMAL LOW (ref 3.5–5.0)
Alkaline Phosphatase: 77 U/L (ref 38–126)
Bilirubin, Direct: 0.1 mg/dL (ref 0.0–0.2)
Indirect Bilirubin: 0.4 mg/dL (ref 0.3–0.9)
Total Bilirubin: 0.5 mg/dL (ref 0.3–1.2)
Total Protein: 6.5 g/dL (ref 6.5–8.1)

## 2021-03-24 LAB — LIPID PANEL
Cholesterol: 47 mg/dL (ref 0–200)
HDL: 23 mg/dL — ABNORMAL LOW (ref >40)
LDL Cholesterol: 14 mg/dL (ref 0–99)
Total CHOL/HDL Ratio: 2 RATIO
Triglycerides: 48 mg/dL (ref <150)
VLDL: 10 mg/dL (ref 0–40)

## 2021-03-24 LAB — GLUCOSE, CAPILLARY
Glucose-Capillary: 104 mg/dL — ABNORMAL HIGH (ref 70–99)
Glucose-Capillary: 107 mg/dL — ABNORMAL HIGH (ref 70–99)
Glucose-Capillary: 110 mg/dL — ABNORMAL HIGH (ref 70–99)
Glucose-Capillary: 123 mg/dL — ABNORMAL HIGH (ref 70–99)
Glucose-Capillary: 131 mg/dL — ABNORMAL HIGH (ref 70–99)
Glucose-Capillary: 131 mg/dL — ABNORMAL HIGH (ref 70–99)
Glucose-Capillary: 133 mg/dL — ABNORMAL HIGH (ref 70–99)
Glucose-Capillary: 139 mg/dL — ABNORMAL HIGH (ref 70–99)
Glucose-Capillary: 147 mg/dL — ABNORMAL HIGH (ref 70–99)
Glucose-Capillary: 150 mg/dL — ABNORMAL HIGH (ref 70–99)
Glucose-Capillary: 162 mg/dL — ABNORMAL HIGH (ref 70–99)
Glucose-Capillary: 177 mg/dL — ABNORMAL HIGH (ref 70–99)
Glucose-Capillary: 197 mg/dL — ABNORMAL HIGH (ref 70–99)
Glucose-Capillary: 204 mg/dL — ABNORMAL HIGH (ref 70–99)

## 2021-03-24 LAB — CBC
HCT: 35 % — ABNORMAL LOW (ref 39.0–52.0)
Hemoglobin: 11.1 g/dL — ABNORMAL LOW (ref 13.0–17.0)
MCH: 30.2 pg (ref 26.0–34.0)
MCHC: 31.7 g/dL (ref 30.0–36.0)
MCV: 95.1 fL (ref 80.0–100.0)
Platelets: 326 10*3/uL (ref 150–400)
RBC: 3.68 MIL/uL — ABNORMAL LOW (ref 4.22–5.81)
RDW: 16.5 % — ABNORMAL HIGH (ref 11.5–15.5)
WBC: 39.8 10*3/uL — ABNORMAL HIGH (ref 4.0–10.5)
nRBC: 0 % (ref 0.0–0.2)

## 2021-03-24 LAB — MAGNESIUM: Magnesium: 2.7 mg/dL — ABNORMAL HIGH (ref 1.7–2.4)

## 2021-03-24 LAB — TSH: TSH: 1.854 u[IU]/mL (ref 0.350–4.500)

## 2021-03-24 LAB — LACTIC ACID, PLASMA
Lactic Acid, Venous: 4.3 mmol/L (ref 0.5–1.9)
Lactic Acid, Venous: 2 mmol/L (ref 0.5–1.9)

## 2021-03-24 LAB — PHOSPHORUS: Phosphorus: 4.4 mg/dL (ref 2.5–4.6)

## 2021-03-24 MED ORDER — PANTOPRAZOLE SODIUM 40 MG PO TBEC
40.0000 mg | DELAYED_RELEASE_TABLET | Freq: Every day | ORAL | Status: DC
  Administered 2021-03-24 – 2021-04-02 (×10): 40 mg via ORAL
  Filled 2021-03-24 (×10): qty 1

## 2021-03-24 MED ORDER — TIOTROPIUM BROMIDE MONOHYDRATE 18 MCG IN CAPS: 18.0000 ug | ORAL_CAPSULE | Freq: Every day | RESPIRATORY_TRACT | Status: DC

## 2021-03-24 MED ORDER — ATORVASTATIN CALCIUM 80 MG PO TABS
80.0000 mg | ORAL_TABLET | Freq: Every day | ORAL | Status: DC
  Administered 2021-03-24 – 2021-04-02 (×10): 80 mg via ORAL
  Filled 2021-03-24 (×10): qty 1

## 2021-03-24 MED ORDER — APIXABAN 2.5 MG PO TABS
2.5000 mg | ORAL_TABLET | Freq: Two times a day (BID) | ORAL | Status: DC
  Administered 2021-03-24 – 2021-03-25 (×2): 2.5 mg via ORAL
  Filled 2021-03-24 (×2): qty 1

## 2021-03-24 MED ORDER — UMECLIDINIUM BROMIDE 62.5 MCG/INH IN AEPB
1.0000 | INHALATION_SPRAY | Freq: Every day | RESPIRATORY_TRACT | Status: DC
  Administered 2021-03-24 – 2021-04-02 (×8): 1 via RESPIRATORY_TRACT
  Filled 2021-03-24: qty 7

## 2021-03-24 MED ORDER — ASPIRIN EC 81 MG PO TBEC
81.0000 mg | DELAYED_RELEASE_TABLET | Freq: Every day | ORAL | Status: DC
  Administered 2021-03-25 – 2021-04-02 (×9): 81 mg via ORAL
  Filled 2021-03-24 (×9): qty 1

## 2021-03-24 MED ORDER — PERFLUTREN LIPID MICROSPHERE
1.0000 mL | INTRAVENOUS | Status: DC | PRN
  Administered 2021-03-24: 3 mL via INTRAVENOUS
  Filled 2021-03-24: qty 10

## 2021-03-24 MED ORDER — POTASSIUM CHLORIDE 10 MEQ/100ML IV SOLN
10.0000 meq | INTRAVENOUS | Status: AC
  Administered 2021-03-24 (×2): 10 meq via INTRAVENOUS
  Filled 2021-03-24 (×2): qty 100

## 2021-03-24 MED ORDER — FUROSEMIDE 10 MG/ML IJ SOLN
40.0000 mg | Freq: Once | INTRAMUSCULAR | Status: AC
  Administered 2021-03-24: 40 mg via INTRAVENOUS
  Filled 2021-03-24: qty 4

## 2021-03-24 MED ORDER — INSULIN ASPART 100 UNIT/ML IJ SOLN
0.0000 [IU] | INTRAMUSCULAR | Status: DC
  Administered 2021-03-24: 2 [IU] via SUBCUTANEOUS
  Administered 2021-03-24 (×2): 3 [IU] via SUBCUTANEOUS
  Administered 2021-03-25: 5 [IU] via SUBCUTANEOUS
  Administered 2021-03-25: 3 [IU] via SUBCUTANEOUS
  Administered 2021-03-25: 5 [IU] via SUBCUTANEOUS

## 2021-03-24 MED ORDER — INSULIN GLARGINE-YFGN 100 UNIT/ML ~~LOC~~ SOLN
15.0000 [IU] | Freq: Every day | SUBCUTANEOUS | Status: DC
  Administered 2021-03-24 – 2021-03-25 (×2): 15 [IU] via SUBCUTANEOUS
  Filled 2021-03-24 (×2): qty 0.15

## 2021-03-24 MED ORDER — POTASSIUM CHLORIDE ER 10 MEQ PO TBCR: 20.0000 meq | EXTENDED_RELEASE_TABLET | Freq: Once | ORAL | Status: DC

## 2021-03-24 MED ORDER — PAROXETINE HCL 20 MG PO TABS
40.0000 mg | ORAL_TABLET | Freq: Every day | ORAL | Status: DC
  Administered 2021-03-25 – 2021-04-02 (×9): 40 mg via ORAL
  Filled 2021-03-24 (×9): qty 2

## 2021-03-24 MED ORDER — ALBUMIN HUMAN 25 % IV SOLN
12.5000 g | Freq: Once | INTRAVENOUS | Status: AC
  Administered 2021-03-24: 12.5 g via INTRAVENOUS
  Filled 2021-03-24: qty 50

## 2021-03-24 MED ORDER — HEPARIN (PORCINE) 25000 UT/250ML-% IV SOLN
1000.0000 [IU]/h | INTRAVENOUS | Status: DC
  Administered 2021-03-24: 1000 [IU]/h via INTRAVENOUS
  Filled 2021-03-24: qty 250

## 2021-03-24 NOTE — Plan of Care (Signed)

## 2021-03-24 NOTE — Progress Notes (Signed)
ANTICOAGULATION CONSULT NOTE - Initial Consult  Pharmacy Consult for heparin Indication: atrial fibrillation  Allergies  Allergen Reactions   Penicillins Hives, Nausea And Vomiting, Swelling and Other (See Comments)    Tolerated Cefepime Has patient had a PCN reaction causing immediate rash, facial/tongue/throat swelling, SOB or lightheadedness with hypotension: YES Has patient had a PCN reaction causing severe rash involving mucus membranes or skin necrosis: No Has patient had a PCN reaction that required hospitalization No Has patient had a PCN reaction occurring within the last 10 years: No If all of the above answers are "NO", then may proceed with Cephalosporin use.     Patient Measurements: Height: 5' (152.4 cm) Weight: 85.5 kg (188 lb 7.9 oz) IBW/kg (Calculated) : 50 Heparin Dosing Weight: 68.9 kg  Vital Signs: Temp: 98.8 F (37.1 C) (08/17 0400) Temp Source: Oral (08/17 0400) BP: 121/62 (08/17 1200) Pulse Rate: 57 (08/17 1200)  Labs: Recent Labs    03/23/21 1029 03/23/21 1034 03/23/21 1451 03/23/21 2248 03/24/21 0155  HGB 11.3* 11.2*  --   --  11.1*  HCT 35.4* 33.0*  --   --  35.0*  PLT 345  --   --   --  326  CREATININE 2.45* 2.50*  --  2.69* 2.64*  TROPONINIHS 466*  --  701*  --   --     Estimated Creatinine Clearance: 25 mL/min (A) (by C-G formula based on SCr of 2.64 mg/dL (H)).   Medical History: Past Medical History:  Diagnosis Date   Abdominal hernia    Chronic, not a good surgical candidate   Abscess, abdomen 12/31/2010   Referred to Wound Care in 01/2011 because of multiple abd abscess with VERY large ventral hernia (please look at image of CT abd/pelvis 09/2010).  Because of hernia I was hesitant to I&D.       Anemia    History of Iron Def Anemia   Anxiety    AVM (arteriovenous malformation)    chronic GI blood loss   Carotid artery disease (HCC)    s/p CEA   Chronic diastolic CHF (congestive heart failure) (HCC)    takes Lasix   Chronic  low back pain    COPD (chronic obstructive pulmonary disease) (HCC)    CVA (cerebral infarction) 09/2010   Bilateral with Left > Right   Diabetes mellitus    GERD (gastroesophageal reflux disease)    History of nuclear stress test    Myoview 9/16:  Inferior, apical and inf-lateral ischemia; not gated; High Risk   Hx of echocardiogram    Echo 5/16:  Mild LVH, EF 55%, indeterm. diast function, WMA could not be ruled out, MAC, trivial MR, mild LAE, normal RVF //  b. Echo 4/17: EF 55-60%, no RWMA, Gr 2 DD, Ao sclerosis, MAC, mild MS, mild LAE, PASP 55 mHg   Hyperlipidemia    Hypertension    Intellectual disability    Sister helps to take care of him and takes him to appts   Itching    all over body; pt scratches and has sores on bilateral arms and abdomen   Lung nodule    Myocardial infarction [facility]) 2016 ?   Heart attack  (  Per  pt. )   Obesity    Oxygen dependent    wears 2 liters at bedtime and when needed   PAF (paroxysmal atrial fibrillation) ([place]) 06/2009   CHADS score 2 (HTN, DM) // Pradaxa for Afib // Pradaxa stopped due to worsening anemia (Hgb  in 5/18 8.5 >> Pradaxa remains on hold)    Pneumonia    hx of   Stroke [facility])    Tobacco user    Smokes 1ppd for multiple years.  Quit after hosp 09/2010.   Tubular adenoma of colon    Wears glasses     Medications:  Infusions:   cefTRIAXone (ROCEPHIN)  IV 2 g (03/24/21 1011)   dextrose 5% lactated ringers 50 mL/hr at 03/24/21 1125   doxycycline (VIBRAMYCIN) IV 100 mg (03/24/21 0827)   norepinephrine (LEVOPHED) Adult infusion 6 mcg/min (03/24/21 1145)   remdesivir 100 mg in NS 100 mL 100 mg (03/24/21 1128)    Assessment: 65 yo M on Eliquis for paroxsymal AF. Will monitor with both aPTT and HL until they correlate. Unclear last dose of Eliquis.  Goal of Therapy:  Heparin level 0.3-0.7 units/ml aPTT 66-102 seconds Monitor platelets by anticoagulation protocol: Yes   Plan:  No bolus Start heparin infusion at 1000 units/hr (10  mL/hr) Check anti-Xa and HL level in 6 hours and daily while on heparin Continue to monitor H&H and platelets  Anderson Malta A Shakai Dolley 03/24/2021,2:21 PM

## 2021-03-24 NOTE — Consult Note (Signed)
WOC Nurse Consult Note: Patient receiving care in [place]. Consult completed via [place] with assistance from primary RN, Yvone Neu. Patient is Covid +. Reason for Consult: abdominal wounds and MASD to groin and abdominal pannus Wound type: Patient has a very large abdominal pannus that creates a problem with MASD-ITD, and the same issue is present in the thigh and groin folds.  On the abdomen, on the patient's right side, there is a large partial thickness wound area that is pink.  The patient tells me he scratches it all the time.  There is a smaller, similar wound, on the midline/left lower abdomen.  The patient reports scratching this all the time as well.  These are NOT pressure injuries. Pressure Injury POA: Yes/No/NA Measurement: To be provided by the bedside RN in the flowsheet section  Wound bed: Drainage (amount, consistency, odor) To be provided by the bedside RN in the flowsheet section  Periwound:intact Dressing procedure/placement/frequency: Phil Dopp is to be used in the abdominal pannus area and bilateral groin areas.  Wash the open wounds on the abdomen with soap and water, pat dry. Place a size appropriate piece of Xeroform Kellie Simmering [phone]) over the areas then a foam dressing.  Change the Xeroform daily.  The foams can stay in place up to 3 days.  Monitor the wound area(s) for worsening of condition such as: Signs/symptoms of infection,  Increase in size,  Development of or worsening of odor, Development of pain, or increased pain at the affected locations.  Notify the medical team if any of these develop.  Thank you for the consult.  Discussed plan of care with the patient and bedside nurse.  [place] nurse will not follow at this time.  Please re-consult the [place] team if needed.  Val Riles, RN, MSN, CWOCN, CNS-BC, pager [phone]

## 2021-03-24 NOTE — Progress Notes (Addendum)
Progress Note  Patient Name: [name] Date of Encounter: 03/24/2021  [facility] Cardiologist: Mertie Moores, MD   Subjective   Appears comfortable on FM O2  Inpatient Medications    Scheduled Meds:  vitamin C  500 mg Oral Daily   baricitinib  1 mg Oral Daily   Chlorhexidine Gluconate Cloth  6 each Topical Daily   dexamethasone (DECADRON) injection  6 mg Intravenous Q24H   zinc sulfate  220 mg Oral Daily   Continuous Infusions:  cefTRIAXone (ROCEPHIN)  IV     dextrose 5% lactated ringers 125 mL/hr at 03/24/21 0600   doxycycline (VIBRAMYCIN) IV Stopped (03/23/21 2013)   epinephrine 1 mcg/min (03/24/21 0600)   insulin 1.6 mL/hr at 03/24/21 0600   lactated ringers 150 mL/hr at 03/23/21 1312   norepinephrine (LEVOPHED) Adult infusion 8 mcg/min (03/24/21 0609)   remdesivir 100 mg in NS 100 mL     PRN Meds: dextrose, docusate sodium, ondansetron (ZOFRAN) IV, perflutren lipid microspheres (DEFINITY) IV suspension, polyethylene glycol   Vital Signs    Vitals:   03/24/21 0615 03/24/21 0630 03/24/21 0655 03/24/21 0715  BP: 106/65 110/61 111/63 (!) 120/48  Pulse: (!) 56 (!) 50 (!) 56 (!) 56  Resp: (!) 26 (!) 22 (!) 28 (!) 23  Temp:      TempSrc:      SpO2: 94% 100% 94% 94%  Weight:      Height:        Intake/Output Summary (Last 24 hours) at 03/24/2021 0810 Last data filed at 03/24/2021 0600 Gross per 24 hour  Intake 3325.1 ml  Output 355 ml  Net 2970.1 ml   Last 3 Weights 03/24/2021 03/23/2021 09/23/2020  Weight (lbs) 188 lb 7.9 oz 185 lb 178 lb  Weight (kg) 85.5 kg 83.915 kg 80.74 kg      Telemetry    Suspect AFib  100's >>> V paced with some intrinsic rates - Personally Reviewed  ECG    No new EKGs - Personally Reviewed  Physical Exam   Patient was observed from doorway with Dr. Rayann Heman Chronically ill appearing on FM O2 appears comfortable  Labs    High Sensitivity Troponin:   Recent Labs  Lab 03/23/21 1029 03/23/21 1451  TROPONINIHS  466* 701*      Chemistry Recent Labs  Lab 03/23/21 1029 03/23/21 1034 03/23/21 2248 03/24/21 0155  NA 129* 130* 135 135  K 4.6 4.5 3.5 4.0  CL 97* 102 104 104  CO2 16*  --  16* 18*  GLUCOSE 366* 361* 198* 120*  BUN 67* 61* 70* 71*  CREATININE 2.45* 2.50* 2.69* 2.64*  CALCIUM 8.5*  --  8.3* 8.5*  PROT 6.9  --  6.1* 6.5  ALBUMIN 2.5*  --  2.1* 2.2*  AST 41  --  47* 52*  ALT 20  --  22 23  ALKPHOS 83  --  69 77  BILITOT 1.0  --  0.6 0.5  GFRNONAA 28*  --  25* 26*  ANIONGAP 16*  --  15 13     Hematology Recent Labs  Lab 03/23/21 1029 03/23/21 1034 03/24/21 0155  WBC 16.6*  --  39.8*  RBC 3.70*  --  3.68*  HGB 11.3* 11.2* 11.1*  HCT 35.4* 33.0* 35.0*  MCV 95.7  --  95.1  MCH 30.5  --  30.2  MCHC 31.9  --  31.7  RDW 16.4*  --  16.5*  PLT 345  --  326  BNPNo results for input(s): BNP, PROBNP in the last 168 hours.   DDimer No results for input(s): DDIMER in the last 168 hours.   Radiology    DG Chest 2 View Result Date: 03/22/2021 CLINICAL DATA:  Decreased breath sounds at right lung base. Chest pain with nonproductive cough for 2 weeks. EXAM: CHEST - 2 VIEW COMPARISON:  Radiograph 06/30/2020. FINDINGS: Similar low lung volumes. Patient is post median sternotomy. Similar cardiomegaly with aortic atherosclerosis and tortuosity there is patchy airspace disease at the right lung base, new from prior exam. Background peribronchial and interstitial thickening is similar. Minor subsegmental atelectasis at the left lung base. Suspected small right pleural effusion. No pneumothorax. Scoliotic curvature of the spine. IMPRESSION: 1. Patchy airspace disease at the right lung base, suspicious for pneumonia. Followup PA and lateral chest X-ray is recommended in 3-4 weeks following trial of antibiotic therapy to ensure resolution and exclude underlying malignancy. 2. Suspected small right pleural effusion. 3. Stable cardiomegaly. Stable mild diffuse interstitial and bronchial  thickening suggesting chronic lung disease, possible COPD. Electronically Signed   By: Keith Rake M.D.   On: 03/22/2021 23:51   DG CHEST PORT 1 VIEW Result Date: 03/23/2021 CLINICAL DATA:  Septic shock bradycardia EXAM: PORTABLE CHEST 1 VIEW COMPARISON:  03/23/2021, 03/22/2021 FINDINGS: Post sternotomy changes. Cardiomegaly with small right-sided pleural effusion. Worsening airspace disease at the right base. Aortic atherosclerosis. No pneumothorax. IMPRESSION: 1. Cardiomegaly with small pleural effusion and worsening edema or pneumonia at the right lung base. Electronically Signed   By: Donavan Foil M.D.   On: 03/23/2021 22:17       Cardiac Studies    03/23/21 TTE IMPRESSIONS   1. Left ventricular ejection fraction, by estimation, is 25 to 30%. The  left ventricle has severely decreased function. The left ventricle  demonstrates global hypokinesis. The left ventricular internal cavity size  was moderately dilated.   2. Right ventricular systolic function was not well visualized. The right  ventricular size is not well visualized. There is moderately elevated  pulmonary artery systolic pressure.   3. Left atrial size was moderately dilated.   4. Right atrial size was moderately dilated.   5. The mitral valve is abnormal. Mild mitral valve regurgitation. No  evidence of mitral stenosis. Moderate mitral annular calcification.   6. The aortic valve was not well visualized. Aortic valve regurgitation  is not visualized. Mild to moderate aortic valve sclerosis/calcification  is present, without any evidence of aortic stenosis.   7. The inferior vena cava is normal in size with greater than 50%  respiratory variability, suggesting right atrial pressure of 3 mmHg.    09/18/2017; TTE Study Conclusions  - Left ventricle: The cavity size was normal. Wall thickness was    increased in a pattern of mild LVH. Indeterminant diastolic    function, atrial fibrillation. Systolic function was  mildly to    moderately reduced. The estimated ejection fraction was in the    range of 40% to 45%. Diffuse hypokinesis.  - Aortic valve: There was no stenosis.  - Mitral valve: Moderately calcified annulus. There was no    significant regurgitation.  - Left atrium: The atrium was mildly dilated.  - Right ventricle: The cavity size was mildly to moderately    dilated. Pacer wire or catheter noted in right ventricle.    Systolic function was mildly reduced.  - Right atrium: The atrium was mildly dilated.  - Tricuspid valve: Peak RV-RA gradient (S): 32 mm Hg.  -  Pulmonary arteries: PA peak pressure: 35 mm Hg (S).  - Inferior vena cava: The vessel was normal in size. The    respirophasic diameter changes were in the normal range (>= 50%),    consistent with normal central venous pressure.   Impressions:  - The patient was in atrial fibrillation. Normal LV size with mild    LV hypertrophy. EF 40-45%. Mild-moderate RV dilation with mildly    decreased systolic function. Borderline pulmonary hypertension.    Patient Profile     65 y.o. male  with a hx of some degree of developmental delay, cared for by his sister, CAD (CABG 2017), chronic CHF (diastolic), PVD (s/p LCEA 0000000 > non-obstructive carotid disease since, follows with [facility]), h/o partial foot amputations 2/2 diabetic ulcer (2014),  COPD (home O2), prior stroke, DM, HTN, HLD, AFib, large abd hernia/mass   Had a syncopal event with EMS observed to have period of asystole and in the ER recurrent long/symptomatic pauses/asystole intermittent compressions > epi gtt > temp pacing wire  COVID + (+/-pneumonia)  Cardiology called and did not think he would be a good candidate for pacer given comorbidities, poor hygiene, skin wounds, and infection risk and concerns of post procedure ability to follow restrictions  Subsequently conversations with CCM and family planned for palliative eval, goals of care, and monitoring clinical progress    EP was asked to weigh in   pacing on telemetry On Epi gtt at 1 Levo at 8 Insulin gtt  Labs today K+ 3.5 BUN/Creat 70/2.69 Mag 2.6 Lactic acid 5.5 > 4.3 WBC 39.8 H/H 11/31 Plts 326   Assessment & Plan    Syncope recurrent pauses/asystolic events on telemetry > temp pacing wire Out patient med list includes Toprol '25mg'$  daily (? not amiodarone) He is pacing on telemetry  2. PAFib CHA2DS2Vasc is 7, out patient was on eliquis 2.'5mg'$  BID (appropriately dosed) NOT on a/c here and will defer to attending cardiology and CCM team this  The patient is being treated for sepsis, COVID pneumonia Despite HR support on pressors LVEF is down He is acutely ill on a number of chronic medical conditions Off small Toprol  Avoid all nodal blocking agents (?remdesivir)   Palliative care is on board, planned for sull code for now with plans to follow clinically for 48hours prior to any further decisions.  Dr. Jackalyn Lombard thoughts to follow    For questions or updates, please contact [facility] Please consult [email or web address] for contact info under        Signed, Baldwin Jamaica, PA-C  03/24/2021, 8:10 AM     I have seen, examined the patient, and reviewed the above assessment and plan.  Changes to above are made where necessary.  On exam, very ill appearing.  Remains on pressors.  Prognosis is guarded.  He is quite ill with COVID.  Currently not a candidate for pacing.  We could reconsider if he clinically improves.  EP will follow.  I would advise palliative care consult for goals of care conversations.  Co Sign: Thompson Grayer, MD 03/24/2021 9:17 AM

## 2021-03-24 NOTE — Progress Notes (Addendum)
Inpatient Diabetes Program Recommendations  AACE/ADA: New Consensus Statement on Inpatient Glycemic Control  Target Ranges:  Prepandial:   less than 140 mg/dL      Peak postprandial:   less than 180 mg/dL (1-2 hours)      Critically ill patients:  140 - 180 mg/dL   Results for [name], [name] (MRN [id number]) as of 03/24/2021 09:26  Ref. Range 03/24/2021 00:14 03/24/2021 01:36 03/24/2021 03:00 03/24/2021 04:43 03/24/2021 05:49 03/24/2021 07:17 03/24/2021 08:22  Glucose-Capillary Latest Ref Range: 70 - 99 mg/dL 139 (H) 123 (H) 110 (H) 147 (H) 131 (H) 133 (H) 131 (H)    Review Results for [name], [name] (MRN [id number]) as of 03/24/2021 09:26  Ref. Range 03/23/2021 10:29 03/23/2021 10:34 03/23/2021 14:51  Glucose Latest Ref Range: 70 - 99 mg/dL 366 (H) 361 (H)   Hemoglobin A1C Latest Ref Range: 4.8 - 5.6 %   7.0 (H)  of Glycemic Control  Diabetes history: DM2 Outpatient Diabetes medications: Lantus 26 units daily Current orders for Inpatient glycemic control: IV insulin; Decadron 6 mg Q24H  Inpatient Diabetes Program Recommendations:    Insulin: Once provider is ready to transition from IV to SQ insulin, please consider ordering Semglee 15 units Q24H, CBGs Q4H, Novolog 0-15 units Q4H.  Thanks, Barnie Alderman, RN, MSN, CDE Diabetes Coordinator Inpatient Diabetes Program [phone] (Team Pager from 8am to 5pm)

## 2021-03-24 NOTE — Evaluation (Signed)
Clinical/Bedside Swallow Evaluation Patient Details  Name: [name] MRN: [id number] Date of Birth: 1956-04-27  Today's Date: 03/24/2021 Time: SLP Start Time (ACUTE ONLY): 4982 SLP Stop Time (ACUTE ONLY): 1640 SLP Time Calculation (min) (ACUTE ONLY): 16 min  Past Medical History:  Past Medical History:  Diagnosis Date   Abdominal hernia    Chronic, not a good surgical candidate   Abscess, abdomen 12/31/2010   Referred to Wound Care in 01/2011 because of multiple abd abscess with VERY large ventral hernia (please look at image of CT abd/pelvis 09/2010).  Because of hernia I was hesitant to I&D.       Anemia    History of Iron Def Anemia   Anxiety    AVM (arteriovenous malformation)    chronic GI blood loss   Carotid artery disease (HCC)    s/p CEA   Chronic diastolic CHF (congestive heart failure) (HCC)    takes Lasix   Chronic low back pain    COPD (chronic obstructive pulmonary disease) (HCC)    CVA (cerebral infarction) 09/2010   Bilateral with Left > Right   Diabetes mellitus    GERD (gastroesophageal reflux disease)    History of nuclear stress test    Myoview 9/16:  Inferior, apical and inf-lateral ischemia; not gated; High Risk   Hx of echocardiogram    Echo 5/16:  Mild LVH, EF 55%, indeterm. diast function, WMA could not be ruled out, MAC, trivial MR, mild LAE, normal RVF //  b. Echo 4/17: EF 55-60%, no RWMA, Gr 2 DD, Ao sclerosis, MAC, mild MS, mild LAE, PASP 55 mHg   Hyperlipidemia    Hypertension    Intellectual disability    Sister helps to take care of him and takes him to appts   Itching    all over body; pt scratches and has sores on bilateral arms and abdomen   Lung nodule    Myocardial infarction [facility]) 2016 ?   Heart attack  (  Per  pt. )   Obesity    Oxygen dependent    wears 2 liters at bedtime and when needed   PAF (paroxysmal atrial fibrillation) ([place]) 06/2009   CHADS score 2 (HTN, DM) // Pradaxa for Afib // Pradaxa stopped due to worsening anemia  (Hgb in 5/18 8.5 >> Pradaxa remains on hold)    Pneumonia    hx of   Stroke ([place])    Tobacco user    Smokes 1ppd for multiple years.  Quit after hosp 09/2010.   Tubular adenoma of colon    Wears glasses    Past Surgical History:  Past Surgical History:  Procedure Laterality Date   AMPUTATION Right 03/29/2013   Procedure: AMPUTATION RAY;  Surgeon: Newt Minion, MD;  Location: [place];  Service: Orthopedics;  Laterality: Right;  Right Foot 3rd and Possible 4th Ray Amputation   AMPUTATION Right 04/23/2013   Procedure: AMPUTATION RIGHT MID-FOOT;  Surgeon: Newt Minion, MD;  Location: [place];  Service: Orthopedics;  Laterality: Right;   arm surgery Left    as a child   CARDIAC CATHETERIZATION N/A 04/30/2015   Procedure: Left Heart Cath and Coronary Angiography;  Surgeon: Belva Crome, MD;  Location: [place] [facility];  Service: Cardiovascular;  Laterality: N/A;   Carotid arteriogram  10/2010   30% right ICA stenosis, 40% left ICA stenosis    CAROTID ENDARTERECTOMY Left 08-13-15   CEA   CATARACT EXTRACTION, BILATERAL     COLONOSCOPY WITH  PROPOFOL N/A 06/10/2014   Procedure: COLONOSCOPY WITH PROPOFOL;  Surgeon: Jerene Bears, MD;  Location: [facility];  Service: Gastroenterology;  Laterality: N/A;   CORONARY ARTERY BYPASS GRAFT N/A 08/13/2015   Procedure: CORONARY ARTERY BYPASS GRAFTING (CABG), ON PUMP, TIMES THREE, USING LEFT INTERNAL MAMMARY ARTERY, RIGHT GREATER SAPHENOUS VEIN HARVESTED ENDOSCOPICALLY;  Surgeon: [name], MD;  Location: [place];  Service: Open Heart Surgery;  Laterality: N/A;   DEBRIDMENT OF DECUBITUS ULCER Right 02/13/2013   ENDARTERECTOMY Left 08/13/2015   Procedure: ENDARTERECTOMY CAROTID;  Surgeon: Angelia Mould, MD;  Location: [place];  Service: Vascular;  Laterality: Left;   ESOPHAGOGASTRODUODENOSCOPY (EGD) WITH PROPOFOL N/A 06/10/2014   Procedure: ESOPHAGOGASTRODUODENOSCOPY (EGD) WITH PROPOFOL;  Surgeon: Jerene Bears, MD;  [facility];  Service:  Gastroenterology;  Laterality: N/A;   GIVENS CAPSULE STUDY N/A 07/09/2014   Procedure: GIVENS CAPSULE STUDY;  Surgeon: Jerene Bears, MD;  Location: [facility];  Service: Gastroenterology;  Laterality: N/A;   I & D EXTREMITY Right 02/13/2013   Procedure: IRRIGATION AND DEBRIDEMENT FOOT ULCER;  Surgeon: Johnny Bridge, MD;  Location: [place];  Service: Orthopedics;  Laterality: Right;  PULSE LAVAGE   INSERTION OF DIALYSIS CATHETER N/A 10/02/2017   Procedure: INSERTION OF TUNNELED DIALYSIS CATHETER;  Surgeon: Elam Dutch, MD;  Location[facility];  Service: Vascular;  Laterality: N/A;   IR REMOVAL TUN CV CATH W/O FL  10/25/2017   MULTIPLE TOOTH EXTRACTIONS     TEE WITHOUT CARDIOVERSION N/A 08/13/2015   Procedure: TRANSESOPHAGEAL ECHOCARDIOGRAM (TEE);  Surgeon: [name], MD;  Location: [place];  Service: Open Heart Surgery;  Laterality: N/A;   TEMPORARY PACEMAKER N/A 09/15/2017   Procedure: TEMPORARY PACEMAKER;  Surgeon: Jettie Booze, MD;  Location: [place] [facility];  Service: Cardiovascular;  Laterality: N/A;   TEMPORARY PACEMAKER N/A 03/23/2021   Procedure: TEMPORARY PACEMAKER;  Surgeon: Martinique, Peter M, MD;  Location: [place] [facility];  Service: Cardiovascular;  Laterality: N/A;   TRANSESOPHAGEAL ECHOCARDIOGRAM  09/2010   No ASD or PFO. EF 60-65%.  Normal systolic function. No evidence of thrombus.    TRANSTHORACIC ECHOCARDIOGRAM  09/2010    The cavity size was normal. Systolic function was vigorous.  EF 65-70%.  Normal wall funciton.    ULTRASOUND GUIDANCE FOR VASCULAR ACCESS  09/15/2017   Procedure: Ultrasound Guidance For Vascular Access;  Surgeon: Jettie Booze, MD;  Location: [place] [facility];  Service: Cardiovascular;;   HPI:  Pt is a 65 y.o. male who presented on 8/16 with report of episodes of syncope and unresponsiveness. In the ED, he has recurrent asystolic events with rounds of CPR. PMT 8/17: continue full scope with watchful waiting. CXR 8/17: Right lower lobe  consolidation which is suspicious for infection or  aspiration. 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 MBS 10/13/17: laryngeal penetation limited to flash with thin and contacting cords with thin via straw and exited vestibule. Timing of swallow functional with mildly incomplete epiglottic closure. Dysphagia 3 and thin liquids recommended   Assessment / Plan / Recommendation Clinical Impression  Pt was seen for bedside swallow evaluation. He reported that he occasionally demonstrates coughing with p.o. intake if he "goes too fast", but he denied any other signs of aspiration. Oral mechanism exam  was [facility] and pt was edentulous. Pt stated that he owns dentures and inconsistently wears the mandibular dentures during meals. Pt demonstrated coughing with 1/3 boluses of thin liquids via cup, but not with individual/consecutive swallows of thin liquids via straw or with solids. Mastication was prolonged with regular texture solids, but functional with dysphagia 3 solids and oral clearance was adequate. A dysphagia 3 diet with thin liquids will be initiated at this time. SLP will follow pt to assess diet tolerance and for instrumental assessment if clinically indicated. SLP Visit Diagnosis: Dysphagia, unspecified (R13.10)    Aspiration Risk  Mild aspiration risk    Diet Recommendation Dysphagia 3 (Mech soft);Thin liquid   Liquid Administration via: Cup;Straw Medication Administration: Whole meds with puree Supervision: Patient able to self feed Compensations: Slow rate;Small sips/bites Postural Changes: Seated upright at 90 degrees    Other  Recommendations Oral Care Recommendations: Oral care BID;Patient independent with oral care   Follow up  Recommendations None      Frequency and Duration min 1 x/week  1 week       Prognosis Prognosis for Safe Diet Advancement: Good Barriers to Reach Goals: Cognitive deficits      Swallow Study   General Date of Onset: 03/23/21 HPI: Pt is a 65 y.o. male who presented on 8/16 with report of episodes of syncope and unresponsiveness. In the ED, he has recurrent asystolic events with rounds of CPR. PMT 8/17: continue full scope with watchful waiting. CXR 8/17: Right lower lobe consolidation which is suspicious for infection or  aspiration. 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 MBS 10/13/17: laryngeal penetation limited to flash with thin and contacting cords with thin via straw and exited vestibule. Timing of swallow functional with mildly incomplete epiglottic closure. Dysphagia 3 and thin liquids recommended Type of Study: Bedside Swallow Evaluation Previous Swallow Assessment: See HPI Diet Prior to this Study: NPO Temperature Spikes Noted: No History of Recent Intubation: No Behavior/Cognition: Alert;Pleasant mood;Cooperative Oral Cavity Assessment: Within Functional Limits Oral Care Completed by SLP: No Oral Cavity - Dentition: Edentulous Vision: Functional for self-feeding Self-Feeding Abilities: Needs assist Patient Positioning: Upright in bed;Postural control adequate for testing Baseline Vocal Quality: Normal Volitional Cough: Weak (pt reported that a hurts when he coughs) Volitional Swallow: Able to elicit    Oral/Motor/Sensory Function Overall Oral Motor/Sensory Function: Within functional limits   Ice Chips Ice chips: Within functional limits Presentation: Spoon   Thin Liquid Thin Liquid: Impaired Presentation:  Straw;Cup Pharyngeal  Phase Impairments: Cough - Immediate (with 1/3 boluses of thin via cup, but not straw)    Nectar Thick Nectar Thick Liquid: Not tested   Honey Thick Honey Thick Liquid: Not tested   Puree Puree: Within functional limits Presentation: Spoon   Solid     Solid: Within functional limits Presentation: Taney I. Hardin Negus, [place], [place] Office number [phone] Pager [place] 03/24/2021,4:49 PM

## 2021-03-24 NOTE — Progress Notes (Addendum)
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 Presented to [facility] ED via EMS 8/16 for syncopal episode vs. episode of unresponsiveness secondary to asystole.   Per EMS report, patient was attempting to exit the bus with his walker when he had a syncopal episode vs. episode of unresponsiveness and fell. Chest compressions were initiated x 1 round and patient became responsive with jerking, clonic movements. Glucose in the field was 350. On arrival to ED, patient was bradycardic and received atropine without improvement. Epi gtt was started and rate/rhythm changed to ST, then VT. Patient continued to have multiple episodes of unresponsiveness/asystole requiring brief CPR vs. stimulation. Cardiology was consulted for recommendations. CXR with c/f RLL PNA, empiric ceftriaxone/azithromycin started.  PCCM was consulted for ICU admission.  Pertinent Medical History:   Past Medical History:  Diagnosis Date   Abdominal hernia    Chronic, not a good surgical candidate   Abscess, abdomen 12/31/2010   Referred to Wound Care in 01/2011 because of multiple abd abscess with VERY large ventral hernia (please look at image of CT abd/pelvis 09/2010).  Because of hernia I was hesitant to I&D.       Anemia    History of Iron Def Anemia   Anxiety    AVM (arteriovenous malformation)    chronic GI blood loss   Carotid artery disease (HCC)    s/p CEA   Chronic diastolic CHF (congestive heart  failure) (HCC)    takes Lasix   Chronic low back pain    COPD (chronic obstructive pulmonary disease) (HCC)    CVA (cerebral infarction) 09/2010   Bilateral with Left > Right   Diabetes mellitus    GERD (gastroesophageal reflux disease)    History of nuclear stress test    Myoview 9/16:  Inferior, apical and inf-lateral ischemia; not gated; High Risk   Hx of echocardiogram    Echo 5/16:  Mild LVH, EF 55%, indeterm. diast function, WMA could not be ruled out, MAC, trivial MR, mild LAE, normal RVF //  b. Echo 4/17: EF 55-60%, no RWMA, Gr 2 DD, Ao sclerosis, MAC, mild MS, mild LAE, PASP 55 mHg   Hyperlipidemia    Hypertension    Intellectual disability    Sister helps to take care of him and takes him to appts   Itching    all over body; pt scratches and has sores on bilateral arms and abdomen   Lung nodule    Myocardial infarction [facility]) 2016 ?   Heart attack  (  Per  pt. )   Obesity    Oxygen dependent    wears 2 liters at bedtime and when needed   PAF (paroxysmal atrial fibrillation) ([place]) 06/2009   CHADS score 2 (HTN, DM) // Pradaxa for Afib // Pradaxa stopped due to worsening anemia (Hgb in 5/18 8.5 >> Pradaxa remains on hold)    Pneumonia    hx of   Stroke ([place])    Tobacco user    Smokes 1ppd for multiple  years.  Quit after hosp 09/2010.   Tubular adenoma of colon    Wears glasses    Significant Hospital Events: Including procedures, antibiotic start and stop dates in addition to other pertinent events   8/16 BIB EMS after patient was exiting the bus and had a syncopal episode/episode of unresponsiveness. Some "jerking" noted as patient became responsive. One round of CPR. In ED, multiple episodes of asystole lasting ~10 seconds requiring 10-20 seconds of CPR each time. Cards/CCM consulted. Epi gtt started. Ceftriaxone/Azithro. Plan for temp pacing wire.  Interim History / Subjective:  CCM consulted for admission  Objective:  Blood pressure (!) 123/59, pulse (!) 56,  temperature 98.8 F (37.1 C), temperature source Oral, resp. rate (!) 25, height 5' (1.524 m), weight 85.5 kg, SpO2 96 %.        Intake/Output Summary (Last 24 hours) at 03/24/2021 1000 Last data filed at 03/24/2021 0600 Gross per 24 hour  Intake 3325.1 ml  Output 355 ml  Net 2970.1 ml   Filed Weights   03/23/21 1055 03/24/21 0500  Weight: 83.9 kg 85.5 kg   Physical Examination: General: Chronically ill-appearing middle-aged man HEENT: [place]/AT, anicteric sclera, PERRL, dry mucous membranes. Neuro: alert, oriented, follows commands  CV: paced with HR 56 PULM: coarse breath sounds, crackles to bases  GI: Obese, large ventral hernia noted, mildly distended. Hypoactive bowel sounds. Multiple wounds in various stages of healing on anterior abdomen. Extremities: amputation to right toes, left leg with chronic changes, no edema  Skin: Warm/dry, multiple areas of ecchymosis on bilateral forearms, bilateral anterior shins. Skin tears noted to anterior L shin and L dorsal hand.  Resolved Hospital Problem List     Assessment & Plan:   Sick sinus syndrome with cardiac collapse and asystole Tachy-brady syndrome s/p Temp Pacer, prolong QTC Presented with several episodes of unresponsiveness/asystole requiring compressions/CPR to rouse. Longstanding issues with tachy-brady syndrome; deemed not a permanent pacemaker candidate due to multiple comorbidities, infection risk and likely inability to heed post-procedure restrictions. Plan - Cardiology and EP Following - D/C EPI - Titrate Levophed for MAP goal >65 - If unable to bridge in 24-48 H, patient would NOT be a permanent pacemaker candidate - Palliative consult for GOC/planning, family is aware  Paroxysmal Afib History of PAF, on Eliquis at home. Plan - Resume Eliquis if passes ST evaluation, if fails will start Heparin gtt.  - Cardiac monitoring  CAD - s/p CABG 08/2015 History of MI - 2016 History of CVA - 2012 Plan - Cardiology  following  Chronic combined systolic and diastolic HF Echo 03/5461 EF 40-45%. Takes Lasix 31m BID at home. Plan - Repeat Echo with EF 25-30 - Hold BB at present, given nodal dysfunction - Assess diuresis needs daily  History of HTN Hyperlipidemia Plan - Continue statin  Acute Hypoxic Respiratory Distress, CXR with small pleural effusion/volume overload  COPD - on 2L HOT CAP, +COVID  Plan - Continue supplemental O2 (currently on NRB)  - Wean O2 for sat > 88% - Pulmonary hygiene - Bronchodilators - Empiric ceftriaxone/doxycycline for CAP - Continue Remdesivir/Bari/Decadron   T2DM Diabetic peripheral neuropathy - S/p R transmetatarsal amputation Plan - D/C insulin gtt  - Semglee 15U daily at home - SSI - CBG Q4H  CKD stage IIIa Baseline Cr 2.5-3.2. Plan  - Trend BMP - Replete electrolytes as indicated - Monitor I&Os - Avoid nephrotoxic agents as able - Ensure adequate renal perfusion  Mild developmental delay - Patient's sister is HProgrammer, systems (right  click and "Reselect all SmartList Selections" daily)   Diet/type: NPO. ST evaluation pending  DVT prophylaxis: DOAC. Pending ST evaluation  GI prophylaxis: PPI Lines: N/A Foley:  Yes, and it is still needed Code Status:  full code Last date of multidisciplinary goals of care discussion: Palliative Care consulted 8/16, family with plans to continue care for 24-48 hours if no improvement will discuss transition to comfort care.   Labs:  CBC: Recent Labs  Lab 03/23/21 1029 03/23/21 1034 03/24/21 0155  WBC 16.6*  --  39.8*  NEUTROABS 14.1*  --   --   HGB 11.3* 11.2* 11.1*  HCT 35.4* 33.0* 35.0*  MCV 95.7  --  95.1  PLT 345  --  938   Basic Metabolic Panel: Recent Labs  Lab 03/23/21 1029 03/23/21 1034 03/23/21 1255 03/23/21 2248 03/24/21 0155  NA 129* 130*  --  135 135  K 4.6 4.5  --  3.5 4.0  CL 97* 102  --  104 104  CO2 16*  --   --  16* 18*  GLUCOSE 366* 361*  --  198* 120*  BUN 67*  61*  --  70* 71*  CREATININE 2.45* 2.50*  --  2.69* 2.64*  CALCIUM 8.5*  --   --  8.3* 8.5*  MG  --   --  2.3 2.6* 2.7*  PHOS  --   --   --   --  4.4   GFR: Estimated Creatinine Clearance: 25 mL/min (A) (by C-G formula based on SCr of 2.64 mg/dL (H)). Recent Labs  Lab 03/23/21 1029 03/23/21 1255 03/23/21 2248 03/24/21 0155  WBC 16.6*  --   --  39.8*  LATICACIDVEN 5.2* 6.7* 5.5* 4.3*   Liver Function Tests: Recent Labs  Lab 03/23/21 1029 03/23/21 2248 03/24/21 0155  AST 41 47* 52*  ALT _0 ALKPHOS 83 69 77  BILITOT 1.0 0.6 0.5  PROT 6.9 6.1* 6.5  ALBUMIN 2.5* 2.1* 2.2*   No results for input(s): LIPASE, AMYLASE in the last 168 hours. No results for input(s): AMMONIA in the last 168 hours.  ABG:    Component Value Date/Time   PHART 7.405 09/25/2017 1633   PCO2ART 33.4 09/25/2017 1633   PO2ART 105.0 09/25/2017 1633   HCO3 17.9 (L) 03/23/2021 2235   TCO2 16 (L) 03/23/2021 1034   ACIDBASEDEF 8.2 (H) 03/23/2021 2235   O2SAT 45.3 03/23/2021 2235    Coagulation Profile: No results for input(s): INR, PROTIME in the last 168 hours.  Cardiac Enzymes: No results for input(s): CKTOTAL, CKMB, CKMBINDEX, TROPONINI in the last 168 hours.  HbA1C: Hemoglobin A1C  Date/Time Value Ref Range Status  08/11/2015 12:00 AM 5.6  Final   Hgb A1c MFr Bld  Date/Time Value Ref Range Status  03/23/2021 02:51 PM 7.0 (H) 4.8 - 5.6 % Final    Comment:    (NOTE) Pre diabetes:          5.7%-6.4%  Diabetes:              >6.4%  Glycemic control for   <7.0% adults with diabetes   09/28/2017 04:54 AM 7.9 (H) 4.8 - 5.6 % Final    Comment:    (NOTE) Pre diabetes:          5.7%-6.4% Diabetes:              >6.4% Glycemic control for   <7.0% adults with diabetes    CBG: Recent Labs  Lab 03/24/21 0443 03/24/21  5852 03/24/21 0717 03/24/21 0822 03/24/21 0925  GLUCAP 147* 131* 133* 131* 107*    Review of Systems:   Denies SOB, Denies Pain, Denies nausea  Past Medical  History:  He,  has a past medical history of Abdominal hernia, Abscess, abdomen (12/31/2010), Anemia, Anxiety, AVM (arteriovenous malformation), Carotid artery disease ([place]), Chronic diastolic CHF (congestive heart failure) ([place]), Chronic low back pain, COPD (chronic obstructive pulmonary disease) ([place]), CVA (cerebral infarction) (09/2010), Diabetes mellitus, GERD (gastroesophageal reflux disease), History of nuclear stress test, echocardiogram, Hyperlipidemia, Hypertension, Intellectual disability, Itching, Lung nodule, Myocardial infarction ([place]) (2016 ?), Obesity, Oxygen dependent, PAF (paroxysmal atrial fibrillation) ([place]) (06/2009), Pneumonia, Stroke ([place]), Tobacco user, Tubular adenoma of colon, and Wears glasses.   Surgical History:   Past Surgical History:  Procedure Laterality Date   AMPUTATION Right 03/29/2013   Procedure: AMPUTATION RAY;  Surgeon: Newt Minion, MD;  Location: [place];  Service: Orthopedics;  Laterality: Right;  Right Foot 3rd and Possible 4th Ray Amputation   AMPUTATION Right 04/23/2013   Procedure: AMPUTATION RIGHT MID-FOOT;  Surgeon: Newt Minion, MD;  Location: [place];  Service: Orthopedics;  Laterality: Right;   arm surgery Left    as a child   CARDIAC CATHETERIZATION N/A 04/30/2015   Procedure: Left Heart Cath and Coronary Angiography;  Surgeon: Belva Crome, MD;  Location: [place] [facility];  Service: Cardiovascular;  Laterality: N/A;   Carotid arteriogram  10/2010   30% right ICA stenosis, 40% left ICA stenosis    CAROTID ENDARTERECTOMY Left 08-13-15   CEA   CATARACT EXTRACTION, BILATERAL     COLONOSCOPY WITH PROPOFOL N/A 06/10/2014   Procedure: COLONOSCOPY WITH PROPOFOL;  Surgeon: Jerene Bears, MD;  Location: [facility];  Service: Gastroenterology;  Laterality: N/A;   CORONARY ARTERY BYPASS GRAFT N/A 08/13/2015   Procedure: CORONARY ARTERY BYPASS GRAFTING (CABG), ON PUMP, TIMES THREE, USING LEFT INTERNAL MAMMARY ARTERY, RIGHT GREATER SAPHENOUS VEIN HARVESTED  ENDOSCOPICALLY;  Surgeon: [name], MD;  Location: [place];  Service: Open Heart Surgery;  Laterality: N/A;   DEBRIDMENT OF DECUBITUS ULCER Right 02/13/2013   ENDARTERECTOMY Left 08/13/2015   Procedure: ENDARTERECTOMY CAROTID;  Surgeon: Angelia Mould, MD;  Location: [place];  Service: Vascular;  Laterality: Left;   ESOPHAGOGASTRODUODENOSCOPY (EGD) WITH PROPOFOL N/A 06/10/2014   Procedure: ESOPHAGOGASTRODUODENOSCOPY (EGD) WITH PROPOFOL;  Surgeon: Jerene Bears, MD;  Location: [facility];  Service: Gastroenterology;  Laterality: N/A;   GIVENS CAPSULE STUDY N/A 07/09/2014   Procedure: GIVENS CAPSULE STUDY;  Surgeon: Jerene Bears, MD;  Location: [facility];  Service: Gastroenterology;  Laterality: N/A;   I & D EXTREMITY Right 02/13/2013   Procedure: IRRIGATION AND DEBRIDEMENT FOOT ULCER;  Surgeon: Johnny Bridge, MD;  Location: [place];  Service: Orthopedics;  Laterality: Right;  PULSE LAVAGE   INSERTION OF DIALYSIS CATHETER N/A 10/02/2017   Procedure: INSERTION OF TUNNELED DIALYSIS CATHETER;  Surgeon: Elam Dutch, MD;  Location: [facility];  Service: Vascular;  Laterality: N/A;   IR REMOVAL TUN CV CATH W/O FL  10/25/2017   MULTIPLE TOOTH EXTRACTIONS     TEE WITHOUT CARDIOVERSION N/A 08/13/2015   Procedure: TRANSESOPHAGEAL ECHOCARDIOGRAM (TEE);  Surgeon: [name], MD;  Location: [place];  Service: Open Heart Surgery;  Laterality: N/A;   TEMPORARY PACEMAKER N/A 09/15/2017   Procedure: TEMPORARY PACEMAKER;  Surgeon: Jettie Booze, MD;  Location: [place] [facility];  Service: Cardiovascular;  Laterality: N/A;   TEMPORARY PACEMAKER N/A 03/23/2021  Procedure: TEMPORARY PACEMAKER;  Surgeon: Martinique, Peter M, MD;  Location: [place] [facility];  Service: Cardiovascular;  Laterality: N/A;   TRANSESOPHAGEAL ECHOCARDIOGRAM  09/2010   No ASD or PFO. EF 60-65%.  Normal systolic function. No evidence of thrombus.    TRANSTHORACIC ECHOCARDIOGRAM  09/2010    The cavity size was normal. Systolic function  was vigorous.  EF 65-70%.  Normal wall funciton.    ULTRASOUND GUIDANCE FOR VASCULAR ACCESS  09/15/2017   Procedure: Ultrasound Guidance For Vascular Access;  Surgeon: Jettie Booze, MD;  Location: [place] [facility];  Service: Cardiovascular;;    Social History:   reports that he quit smoking about 10 years ago. His smoking use included cigarettes and pipe. He has a 42.00 pack-year smoking history. He quit smokeless tobacco use about 10 years ago. He reports that he does not drink alcohol and does not use drugs.   Family History:  His family history includes Breast cancer in his maternal grandmother; Heart attack in his maternal grandmother; Heart disease in his brother, father, maternal grandmother, and mother; Hypertension in his sister; Peripheral vascular disease in his father; Stomach cancer in his maternal uncle. There is no history of Colon cancer or Stroke.   Allergies: Allergies  Allergen Reactions   Penicillins Hives, Nausea And Vomiting, Swelling and Other (See Comments)    Tolerated Cefepime Has patient had a PCN reaction causing immediate rash, facial/tongue/throat swelling, SOB or lightheadedness with hypotension: YES Has patient had a PCN reaction causing severe rash involving mucus membranes or skin necrosis: No Has patient had a PCN reaction that required hospitalization No Has patient had a PCN reaction occurring within the last 10 years: No If all of the above answers are "NO", then may proceed with Cephalosporin use.     Home Medications: Prior to Admission medications   Medication Sig Start Date End Date Taking? Authorizing Provider  acetaminophen (TYLENOL) 325 MG tablet Take 650 mg by mouth 3 (three) times daily as needed for mild pain.   Yes [provider]  apixaban (ELIQUIS) 2.5 MG TABS tablet Take 2.5 mg by mouth 2 (two) times daily.   Yes [provider]  aspirin 81 MG EC tablet Take 81 mg by mouth daily.   Yes [provider]   atorvastatin (LIPITOR) 80 MG tablet Take 1 tablet (80 mg total) by mouth daily. 10/26/17  Yes Colbert Ewing, MD  calcium carbonate (TUMS - DOSED IN MG ELEMENTAL CALCIUM) 500 MG chewable tablet Chew 1 tablet by mouth 3 (three) times daily as needed for indigestion or heartburn.   Yes [provider]  Cholecalciferol (VITAMIN D3) 50 MCG (2000 UT) TABS Take 50 mcg by mouth daily.   Yes [provider]  Darbepoetin Alfa (ARANESP) 150 MCG/0.3ML SOSY injection Inject 0.3 mLs (150 mcg total) into the skin every Wednesday at 6 PM. Patient taking differently: Inject 150 mcg into the skin See admin instructions. Every 8 weeks 11/01/17  Yes Colbert Ewing, MD  furosemide (LASIX) 80 MG tablet Take 1 tablet (80 mg total) by mouth 2 (two) times daily as needed for fluid. 10/26/17 05/15/21 Yes Colbert Ewing, MD  HYDROcodone-acetaminophen (NORCO/VICODIN) 5-325 MG tablet Take 1 tablet by mouth daily.   Yes [provider]  insulin glargine (LANTUS) 100 UNIT/ML injection Inject 0.14 mLs (14 Units total) into the skin at bedtime. Patient taking differently: Inject 26 Units into the skin daily. 10/26/17  Yes Colbert Ewing, MD  ipratropium-albuterol (DUONEB) 0.5-2.5 (3) MG/3ML SOLN  Take 3 mLs by nebulization 2 (two) times daily as needed (COPD).   Yes [provider]  metoprolol succinate (TOPROL-XL) 25 MG 24 hr tablet Take 25 mg by mouth daily.   Yes [provider]  Multiple Vitamin (MULTIVITAMIN) capsule Take 1 capsule by mouth daily.   Yes [provider]  nitroGLYCERIN (NITROSTAT) 0.4 MG SL tablet Place 0.4 mg under the tongue every 5 (five) minutes as needed for chest pain.   Yes [provider]  nystatin (MYCOSTATIN/NYSTOP) powder Apply topically 4 (four) times daily. Apply to inguinal area/scrotum and under pannus Patient taking differently: Apply 1 application topically in the morning, at noon, and at bedtime. To skin folds to treat intertrigo  03/02/18  Yes Ardis Hughs, MD  OXYGEN Inhale 2.5 L into the lungs See admin instructions. As needed and at bedtime for shortness of breath   Yes [provider]  pantoprazole (PROTONIX) 40 MG tablet Take 40 mg by mouth daily.   Yes [provider]  PARoxetine (PAXIL) 40 MG tablet Take 40 mg by mouth daily.    Yes [provider]  polyvinyl alcohol (LIQUIFILM TEARS) 1.4 % ophthalmic solution Place 1 drop into both eyes 2 (two) times daily as needed for dry eyes. Natural Tears   Yes [provider]  senna-docusate (SENOKOT-S) 8.6-50 MG tablet Take 1 tablet by mouth at bedtime as needed for mild constipation. 10/26/17  Yes Colbert Ewing, MD  tiotropium (SPIRIVA) 18 MCG inhalation capsule Place 18 mcg into inhaler and inhale daily.   Yes [provider]  triamcinolone cream (KENALOG) 0.1 % Apply 1 application topically 2 (two) times daily as needed (legs and other itchy areas).   Yes [provider]  amiodarone (PACERONE) 200 MG tablet Take one tablet by mouth daily Monday through Saturday. Do NOT take on Sunday. Patient not taking: Reported on 03/23/2021 06/11/19   Evans Lance, MD  multivitamin (RENA-VIT) TABS tablet Take 1 tablet by mouth at bedtime. Patient not taking: Reported on 03/23/2021 10/26/17   Colbert Ewing, MD  predniSONE (DELTASONE) 10 MG tablet Take 10-40 mg by mouth See admin instructions. 19m daily for 3 days, then 245mdaily for 3 days, then 1042maily for 3 days. Then stop.    [provider]  risperiDONE (RISPERDAL) 0.5 MG tablet Take 0.5 mg by mouth 2 (two) times daily. Patient not taking: Reported on 03/23/2021    [provider]  traMADol (ULTRAM) 50 MG tablet Take 1-2 tablets (50-100 mg total) by mouth every 6 (six) hours as needed for moderate pain. Patient not taking: Reported on 03/23/2021 03/02/18   HerArdis HughsD    Critical care time: 32 minutes   KatOmar PersonP [place]  Pulmonary & Critical Care 03/24/21 10:00 AM  Please see [email or web address] for pager details.  From 7A-7P if no response, please call [phone] After hours, please call ELink [phone]

## 2021-03-24 NOTE — Progress Notes (Signed)
Daily Progress Note   Patient Name: [name]       Date: 03/24/2021 DOB: Nov 02, 1955  Age: 65 y.o. MRN#: [id number] Attending Physician: Candee Furbish, MD Primary Care Physician: Janifer Adie, MD Admit Date: 03/23/2021  Reason for Consultation/Follow-up: Establishing goals of care  Subjective: Chart review performed. Discussed case and received updates from Joseph Pedro, NP. Received report from primary RN - no acute concerns. Per NP and RN , patient has improved since yesterday - weaning off pressors; however, now requiring non-rebreather likely due to fluid volume overload. Per Joseph Parma NP, if patient improves, can re-evalaute for PPM. Swallow eval is pending. Patient is still acutely ill with multiple comorbidities.   Went to visit patient at bedside - no family/visitors present. Patient was lying in bed awake, alert, oriented, and able to participate in conversation. No signs or non-verbal gestures of pain or discomfort noted. No respiratory distress, increased work of breathing, or secretions noted. Patient does endorse sternal soreness. Reviewed it's due to multiple rounds of CPR - patient understands. Patient states he was short of breath last night, but is not short of breath today. Overall, he states he feels better today.  Encouraged patient/family to consider DNR/DNI status understanding evidenced based poor outcomes in similar hospitalized patient, as the cause of arrest is likely associated with advanced chronic/terminal illness rather than an easily reversible acute cardio-pulmonary event. I explained that DNR/DNI does not change the medical plan and it only comes into effect after a person has arrested (died).  It is a protective measure to keep [place] from harming the  patient in their last moments of life. We reviewed that if he were to survive a resuscitative event, he would continue to be in poor health. [name] was clear he would be willing to receive CPR in the event of another cardiac event. We discussed intubation - education provided - he would be willing to do a trial period of intubation but would not want long term. Patient was not agreeable to DNR/DNI with understanding that he would receive CPR, defibrillation, ACLS medications, and/or intubation.   12:45 PM Called patient's sister/HCPOA/[name] to continue [place] and offer support. Provided [name] with updates per Joseph Parma NP. Reviewed conversation with patient as outlined above about code status. Encouraged continued conversations around code status - [name]  understands with each cardiac event patient's chance of meaningful recovery declines. [name] understands patient has shown improvement, but still remains acutely ill with high risk of decline/decompensation. Reviewed patient's current medications per [name]'s request. She is agreeable to continue full code/full scope with watchful waiting.   All questions and concerns addressed. Encouraged to call with questions and/or concerns. PMT number provided.    Length of Stay: 1  Current Medications: Scheduled Meds:   vitamin C  500 mg Oral Daily   baricitinib  1 mg Oral Daily   Chlorhexidine Gluconate Cloth  6 each Topical Daily   dexamethasone (DECADRON) injection  6 mg Intravenous Q24H   insulin aspart  0-15 Units Subcutaneous Q4H   insulin glargine-yfgn  15 Units Subcutaneous Daily   umeclidinium bromide  1 puff Inhalation Daily   zinc sulfate  220 mg Oral Daily    Continuous Infusions:  cefTRIAXone (ROCEPHIN)  IV 2 g (03/24/21 1011)   dextrose 5% lactated ringers 50 mL/hr at 03/24/21 1125   doxycycline (VIBRAMYCIN) IV 100 mg (03/24/21 0827)   norepinephrine (LEVOPHED) Adult infusion 6 mcg/min (03/24/21 1145)   remdesivir 100 mg in NS 100 mL  100 mg (03/24/21 1128)    PRN Meds: dextrose, docusate sodium, ondansetron (ZOFRAN) IV, polyethylene glycol  Physical Exam Vitals and nursing note reviewed.  Constitutional:      General: He is not in acute distress.    Appearance: He is ill-appearing.  Pulmonary:     Effort: No respiratory distress.     Comments: Non-rebreather Skin:    General: Skin is warm and dry.     Findings: Ecchymosis present.  Neurological:     Mental Status: He is alert and oriented to person, place, and time.     Motor: Weakness present.  Psychiatric:        Attention and Perception: Attention normal.        Behavior: Behavior is cooperative.        Cognition and Memory: Cognition and memory normal.            Vital Signs: BP (!) 123/59 (BP Location: Right Arm)   Pulse (!) 56   Temp 98.8 F (37.1 C) (Oral)   Resp (!) 25   Ht 5' (1.524 m)   Wt 85.5 kg   SpO2 96%   BMI 36.81 kg/m  SpO2: SpO2: 96 % O2 Device: O2 Device: (S) NRB O2 Flow Rate: O2 Flow Rate (L/min): 15 L/min  Intake/output summary:  Intake/Output Summary (Last 24 hours) at 03/24/2021 1152 Last data filed at 03/24/2021 0600 Gross per 24 hour  Intake 3325.1 ml  Output 355 ml  Net 2970.1 ml   LBM:   Baseline Weight: Weight: 83.9 kg Most recent weight: Weight: 85.5 kg       Palliative Assessment/Data: PPS 10-20% - NPO status, swallow eval pending      Patient Active Problem List   Diagnosis Date Noted   Cardiac asystole ([place]) 03/23/2021   History of cardioembolic cerebrovascular accident (CVA)    Fluid overload    Hyponatremia    Anemia of chronic disease    Pressure injury of skin 09/26/2017   AKI (acute kidney injury) (HCC)    Aspiration pneumonia of both lower lobes (HCC)    Influenza with respiratory manifestation    Tachy-brady syndrome (HCC)    Sinus pause    Intertrigo    Right bundle branch block (RBBB) on electrocardiography    Dysphagia    Sepsis ([place]) 02/13/2016  Ischemic cardiomyopathy 09/17/2015    CAD (coronary artery disease) 07/14/2015   Chronic combined systolic and diastolic CHF (congestive heart failure) ([place]) 07/14/2015   Abrasion of abdominal wall 10/26/2014   Acute respiratory failure with hypoxia (HCC) 10/26/2014   Gastritis and gastroduodenitis 06/10/2014   Benign neoplasm of ascending colon 06/10/2014   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 ([place]) 02/14/2013   Wound, open, foot 12/13/2012   IDA (iron deficiency anemia) 12/13/2012   Hyperkalemia 05/22/2012   Diabetic leg ulcer ([place]) 05/15/2012   Goals of care, counseling/discussion 05/15/2012   Leg weakness, bilateral 12/10/2011   Carotid artery disease ([place]) 08/31/2011   Sciatica 05/22/2011   Ventral hernia without obstruction or gangrene 01/24/2011   Incidental lung nodule, greater than or equal to 33m 11/24/2010   CVA (cerebral vascular accident) ([facility] 10/24/2010   PAF (paroxysmal atrial fibrillation) ([facility] 07/24/2009   Anxiety state 06/22/2007   DM (diabetes mellitus), type 2, uncontrolled ([facility] 12/22/2006   Essential hypertension 12/22/2006   HYPERCHOLESTEROLEMIA 10/05/2006   TOBACCO DEPENDENCE 10/05/2006   Unspecified intellectual disabilities 10/05/2006   COPD (chronic obstructive pulmonary disease) ([facility] 10/05/2006    Palliative Care Assessment & Plan   Patient Profile:  65y.o. male  with past medical history of  admitted on 03/23/2021 with  mild developmental delay, HTN, HLD, Afib with RVR, tachy-brady syndrome (not a permanent pacemaker candidate), chronic combined systolic/diastolic HF ([id number], CAD (MI 2016, s/p CABG 2017), CVA (2012), T2DM (c/b diabetic neuropathy, s/p R transmetatarsal amputation), CKD stage III.    Patient is enrolled in PACE program. In the ED, he has recurrent asystolic events with several rounds of CPR. He is not a candidate for a permanent pacemaker. Palliative medicine has been consulted to assist with goals of care conversation.  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 Family are encouraged patient has improved over the last 24 hours Continue full code status - patient/family are aware he is still acutely ill and at high risk for decline/decompensation. Patient states he is open to a limited timed trial of intubation if needed.  Continue GOC pending clinical course PMT will continue to follow and support holistically  Goals of Care and Additional Recommendations: Limitations on Scope of Treatment: Full Scope Treatment and No Tracheostomy  Code Status:    Code Status Orders  (From admission, onward)           Start     Ordered   03/23/21 1307  Full code  Continuous        03/23/21 1309           Code Status History     Date Active Date Inactive Code Status Order ID Comments User Context   09/15/2017 1438 10/26/2017 1912 Full Code [id number] RCollier Salina MD ED   08/12/2016 0212 08/14/2016 1929 Full Code [id number] RShela Leff MD Inpatient   02/14/2016 0110 02/19/2016 1852 Full Code [id number] SNorval Morton MD ED   08/13/2015 1409 08/21/2015 1423 Full Code [id number] ZNani Skillern PA-C Inpatient   04/30/2015 1416 04/30/2015 1934 Full Code [id number] SBelva Crome MD Inpatient   10/26/2014 1809 11/03/2014 1553 Partial Code [id number] BCollene Gobble MD Inpatient   10/26/2014 0821 10/26/2014 1809 Full Code [id number] JJana Hakim  C, MD ED   04/23/2013 2049 04/25/2013 1814 Full Code [id number]  Newt Minion, MD Inpatient   03/29/2013 1419 03/30/2013 1655 Full Code [id number]  Newt Minion, MD Inpatient   11/09/2012 2309 11/11/2012 1953 Full Code [id number]  Ma Hillock, DO Inpatient       Prognosis:  Guarded - patient acutely ill with  multiple comorbidities at high risk for decline  Discharge Planning: To Be Determined  Care plan was discussed with patient, patient's HCPOA, primary RN, Joseph Parma NP  Thank you for allowing the Palliative Medicine Team to assist in the care of this patient.   Total Time 45 minutes Prolonged Time Billed  no       Greater than 50%  of this time was spent counseling and coordinating care related to the above assessment and plan.  Joseph Landsman, NP  Please contact Palliative Medicine Team phone at [phone] for questions and concerns.

## 2021-03-25 DIAGNOSIS — I442 Atrioventricular block, complete: Secondary | ICD-10-CM | POA: Diagnosis not present

## 2021-03-25 DIAGNOSIS — N189 Chronic kidney disease, unspecified: Secondary | ICD-10-CM | POA: Diagnosis present

## 2021-03-25 DIAGNOSIS — I469 Cardiac arrest, cause unspecified: Secondary | ICD-10-CM | POA: Diagnosis not present

## 2021-03-25 DIAGNOSIS — I495 Sick sinus syndrome: Secondary | ICD-10-CM | POA: Diagnosis not present

## 2021-03-25 LAB — CBC
HCT: 35.7 % — ABNORMAL LOW (ref 39.0–52.0)
Hemoglobin: 11 g/dL — ABNORMAL LOW (ref 13.0–17.0)
MCH: 29.5 pg (ref 26.0–34.0)
MCHC: 30.8 g/dL (ref 30.0–36.0)
MCV: 95.7 fL (ref 80.0–100.0)
Platelets: 217 10*3/uL (ref 150–400)
RBC: 3.73 MIL/uL — ABNORMAL LOW (ref 4.22–5.81)
RDW: 16.7 % — ABNORMAL HIGH (ref 11.5–15.5)
WBC: 22.6 10*3/uL — ABNORMAL HIGH (ref 4.0–10.5)
nRBC: 0 % (ref 0.0–0.2)

## 2021-03-25 LAB — PHOSPHORUS: Phosphorus: 4.9 mg/dL — ABNORMAL HIGH (ref 2.5–4.6)

## 2021-03-25 LAB — GLUCOSE, CAPILLARY
Glucose-Capillary: 213 mg/dL — ABNORMAL HIGH (ref 70–99)
Glucose-Capillary: 186 mg/dL — ABNORMAL HIGH (ref 70–99)
Glucose-Capillary: 108 mg/dL — ABNORMAL HIGH (ref 70–99)
Glucose-Capillary: 102 mg/dL — ABNORMAL HIGH (ref 70–99)
Glucose-Capillary: 160 mg/dL — ABNORMAL HIGH (ref 70–99)

## 2021-03-25 LAB — BASIC METABOLIC PANEL
Anion gap: 13 (ref 5–15)
BUN: 78 mg/dL — ABNORMAL HIGH (ref 8–23)
CO2: 16 mmol/L — ABNORMAL LOW (ref 22–32)
Calcium: 8.2 mg/dL — ABNORMAL LOW (ref 8.9–10.3)
Chloride: 104 mmol/L (ref 98–111)
Creatinine, Ser: 2.17 mg/dL — ABNORMAL HIGH (ref 0.61–1.24)
GFR, Estimated: 33 mL/min — ABNORMAL LOW (ref >60)
Glucose, Bld: 215 mg/dL — ABNORMAL HIGH (ref 70–99)
Potassium: 4.4 mmol/L (ref 3.5–5.1)
Sodium: 133 mmol/L — ABNORMAL LOW (ref 135–145)

## 2021-03-25 LAB — MAGNESIUM: Magnesium: 2.5 mg/dL — ABNORMAL HIGH (ref 1.7–2.4)

## 2021-03-25 MED ORDER — FUROSEMIDE 10 MG/ML IJ SOLN: 40.0000 mg | Freq: Once | INTRAMUSCULAR | Status: DC

## 2021-03-25 MED ORDER — GUAIFENESIN-DM 100-10 MG/5ML PO SYRP: 5.0000 mL | ORAL_SOLUTION | ORAL | Status: DC | PRN

## 2021-03-25 MED ORDER — HEPARIN (PORCINE) 25000 UT/250ML-% IV SOLN
1000.0000 [IU]/h | INTRAVENOUS | Status: DC
  Administered 2021-03-25: 1000 [IU]/h via INTRAVENOUS
  Filled 2021-03-25: qty 250

## 2021-03-25 MED ORDER — APIXABAN 5 MG PO TABS: 5.0000 mg | ORAL_TABLET | Freq: Two times a day (BID) | ORAL | Status: DC

## 2021-03-25 MED ORDER — INSULIN ASPART 100 UNIT/ML IJ SOLN
0.0000 [IU] | Freq: Three times a day (TID) | INTRAMUSCULAR | Status: DC
  Administered 2021-03-26 – 2021-03-27 (×2): 3 [IU] via SUBCUTANEOUS
  Administered 2021-03-28: 7 [IU] via SUBCUTANEOUS
  Administered 2021-03-28: 3 [IU] via SUBCUTANEOUS
  Administered 2021-03-28: 4 [IU] via SUBCUTANEOUS
  Administered 2021-03-29: 7 [IU] via SUBCUTANEOUS
  Administered 2021-03-29 – 2021-03-30 (×4): 4 [IU] via SUBCUTANEOUS
  Administered 2021-03-31 (×2): 3 [IU] via SUBCUTANEOUS

## 2021-03-25 MED ORDER — GUAIFENESIN ER 600 MG PO TB12
1200.0000 mg | ORAL_TABLET | Freq: Two times a day (BID) | ORAL | Status: DC
  Administered 2021-03-25 – 2021-04-02 (×16): 1200 mg via ORAL
  Filled 2021-03-25 (×16): qty 2

## 2021-03-25 MED ORDER — INSULIN GLARGINE-YFGN 100 UNIT/ML ~~LOC~~ SOLN
26.0000 [IU] | Freq: Every day | SUBCUTANEOUS | Status: DC
  Filled 2021-03-25: qty 0.26

## 2021-03-25 NOTE — Progress Notes (Signed)
  Speech Language Pathology Treatment: Dysphagia  Patient Details Name: [name] MRN: [id number] DOB: 04/15/1956 Today's Date: 03/25/2021 Time: [id number] SLP Time Calculation (min) (ACUTE ONLY): 15 min  Assessment / Plan / Recommendation Clinical Impression  Pt demonstrates coughing at baseline, but severity and frequency of cough increases with PO intake - liquids and meal. Trials nectar thick liquids with increased comfort reported from pt. Will change pt to dys 3/nectar at least for the weekend and reassess Monday. Pt may have sips of thin water between meals and ice, otherwise nectar thick.   HPI HPI: Pt is a 65 y.o. male who presented on 8/16 with report of episodes of syncope and unresponsiveness. In the ED, he has recurrent asystolic events with rounds of CPR. PMT 8/17: continue full scope with watchful waiting. CXR 8/17: Right lower lobe consolidation which is suspicious for infection or  aspiration. PMH: mild developmental delay, HTN, HLD, Afib with RVR, tachy-brady syndrome (not a permanent pacemaker candidate), chronic combined systolic/diastolic HF ([id number]), CAD (MI 2016, s/p CABG 2017), CVA (2012), T2DM (c/b diabetic neuropathy, s/p R transmetatarsal amputation), CKD stage III. MBS 10/13/17: laryngeal penetation limited to flash with thin and contacting cords with thin via straw and exited vestibule. Timing of swallow functional with mildly incomplete epiglottic closure. Dysphagia 3 and thin liquids recommended      SLP Plan  Continue with current plan of care       Recommendations  Diet recommendations: Dysphagia 3 (mechanical soft);Nectar-thick liquid Liquids provided via: Cup Medication Administration: Whole meds with puree Supervision: Patient able to self feed Compensations: Slow rate;Small sips/bites Postural Changes and/or Swallow Maneuvers: Seated upright 90 degrees                Oral Care Recommendations: Oral care BID;Patient independent with  oral care Follow up Recommendations: None SLP Visit Diagnosis: Dysphagia, unspecified (R13.10) Plan: Continue with current plan of care       GO               Joseph [place], MA [place] Pager [phone] Office [phone]  [name] 03/25/2021, 10:34 AM

## 2021-03-25 NOTE — Progress Notes (Signed)
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 Presented to [facility] ED via EMS 8/16 for syncopal episode vs. episode of unresponsiveness secondary to asystole.   Per EMS report, patient was attempting to exit the bus with his walker when he had a syncopal episode vs. episode of unresponsiveness and fell. Chest compressions were initiated x 1 round and patient became responsive with jerking, clonic movements. Glucose in the field was 350. On arrival to ED, patient was bradycardic and received atropine without improvement. Epi gtt was started and rate/rhythm changed to ST, then VT. Patient continued to have multiple episodes of unresponsiveness/asystole requiring brief CPR vs. stimulation. Cardiology was consulted for recommendations. CXR with c/f RLL PNA, empiric ceftriaxone/azithromycin started.  PCCM was consulted for ICU admission.  Pertinent Medical History:   Past Medical History:  Diagnosis Date   Abdominal hernia    Chronic, not a good surgical candidate   Abscess, abdomen 12/31/2010   Referred to Wound Care in 01/2011 because of multiple abd abscess with VERY large ventral hernia (please look at image of CT abd/pelvis 09/2010).  Because of hernia I was hesitant to I&D.       Anemia    History of Iron Def Anemia   Anxiety    AVM (arteriovenous malformation)    chronic GI blood loss   Carotid artery disease (HCC)    s/p CEA   Chronic diastolic CHF (congestive heart  failure) (HCC)    takes Lasix   Chronic low back pain    COPD (chronic obstructive pulmonary disease) (HCC)    CVA (cerebral infarction) 09/2010   Bilateral with Left > Right   Diabetes mellitus    GERD (gastroesophageal reflux disease)    History of nuclear stress test    Myoview 9/16:  Inferior, apical and inf-lateral ischemia; not gated; High Risk   Hx of echocardiogram    Echo 5/16:  Mild LVH, EF 55%, indeterm. diast function, WMA could not be ruled out, MAC, trivial MR, mild LAE, normal RVF //  b. Echo 4/17: EF 55-60%, no RWMA, Gr 2 DD, Ao sclerosis, MAC, mild MS, mild LAE, PASP 55 mHg   Hyperlipidemia    Hypertension    Intellectual disability    Sister helps to take care of him and takes him to appts   Itching    all over body; pt scratches and has sores on bilateral arms and abdomen   Lung nodule    Myocardial infarction [facility]) 2016 ?   Heart attack  (  Per  pt. )   Obesity    Oxygen dependent    wears 2 liters at bedtime and when needed   PAF (paroxysmal atrial fibrillation) ([place]) 06/2009   CHADS score 2 (HTN, DM) // Pradaxa for Afib // Pradaxa stopped due to worsening anemia (Hgb in 5/18 8.5 >> Pradaxa remains on hold)    Pneumonia    hx of   Stroke ([place])    Tobacco user    Smokes 1ppd for multiple  years.  Quit after hosp 09/2010.   Tubular adenoma of colon    Wears glasses    Significant Hospital Events: Including procedures, antibiotic start and stop dates in addition to other pertinent events   8/16 BIB EMS after patient was exiting the bus and had a syncopal episode/episode of unresponsiveness. Some "jerking" noted as patient became responsive. One round of CPR. In ED, multiple episodes of asystole lasting ~10 seconds requiring 10-20 seconds of CPR each time. Cards/CCM consulted. Epi gtt started. Ceftriaxone/Azithro. Temp pacing wire paced.   Interim History / Subjective:  This AM with tachycardia and hypertension, levophed paused, EP at bedside.   Objective:   Blood pressure 108/88, pulse (!) 135, temperature 98.6 F (37 C), temperature source Oral, resp. rate (!) 26, height 5' (1.524 m), weight 85.8 kg, SpO2 100 %.        Intake/Output Summary (Last 24 hours) at 03/25/2021 0804 Last data filed at 03/25/2021 0600 Gross per 24 hour  Intake 1796.67 ml  Output 1115 ml  Net 681.67 ml   Filed Weights   03/23/21 1055 03/24/21 0500 03/25/21 0442  Weight: 83.9 kg 85.5 kg 85.8 kg   Physical Examination: General: Chronically ill-appearing middle-aged man HEENT: Dry MM  Neuro: alert, oriented, follows commands  CV: Irregular, Tachy, HR 140 PULM: coarse breath sounds, no use of accessory muscles  GI: Obese, large ventral hernia noted, mildly distended. Active bowel sounds Multiple wounds in various stages of healing on anterior abdomen. Extremities: amputation to right toes, left leg with chronic changes, no edema  Skin: Warm/dry, multiple areas of ecchymosis on bilateral forearms, bilateral anterior shins. Skin tears noted to anterior L shin and L dorsal hand.  Resolved Hospital Problem List     Assessment & Plan:   Sick sinus syndrome with cardiac collapse and asystole Tachy-brady syndrome s/p Temp Pacer, prolong QTC Presented with several episodes of unresponsiveness/asystole requiring compressions/CPR to rouse. Longstanding issues with tachy-brady syndrome; deemed not a permanent pacemaker candidate due to multiple comorbidities, infection risk and likely inability to heed post-procedure restrictions. Plan - Cardiology and EP Following > plan for pacer placement, follow for timing, will need to hold eliquis for 24-48 hours prior  - Titrate Levophed for MAP goal >65 >> currently paused  - Palliative following   Paroxysmal Afib History of PAF, on Eliquis at home. Plan - Continue Eliquis - Cardiac monitoring  CAD - s/p CABG 08/2015 History of MI - 2016 History of CVA - 2012 Plan - Cardiology following  Chronic combined systolic and  diastolic HF Echo 10/2353 EF 40-45%. Takes Lasix 56m BID at home. >> repeat ECHO with EF 25-30 Plan - Hold BB at present, given nodal dysfunction - Assess diuresis needs daily   History of HTN Hyperlipidemia Plan - Continue statin  Acute Hypoxic Respiratory Distress, CXR with small pleural effusion/volume overload  COPD - on 2L HOT CAP, +COVID  Plan - Continue supplemental O2 (currently on NRB)  - Wean O2 for sat > 88% - Pulmonary hygiene - Bronchodilators - Started Mucinex and PRN Robitussin  - Empiric ceftriaxone/doxycycline for CAP - Continue Remdesivir/Bari/Decadron   T2DM Diabetic peripheral neuropathy - S/p R transmetatarsal amputation Plan - Semglee 15U daily - SSI - CBG Q4H  CKD stage IIIa Baseline Cr 2.5-3.2. Plan  - Trend BMP - Replete electrolytes as indicated - Monitor I&Os - Avoid nephrotoxic agents as able - Ensure adequate renal perfusion  Mild developmental delay - Patient's sister is HCPOA  Best Practice: (right click and "  Reselect all SmartList Selections" daily)   Diet/type: Thickened Liquids  DVT prophylaxis: DOAC.  GI prophylaxis: PPI Lines: N/A Foley:  Yes, and it is still needed Code Status:  full code Last date of multidisciplinary goals of care discussion: Ongoing.   Labs:  CBC: Recent Labs  Lab 03/23/21 1029 03/23/21 1034 03/24/21 0155 03/25/21 0047  WBC 16.6*  --  39.8* 22.6*  NEUTROABS 14.1*  --   --   --   HGB 11.3* 11.2* 11.1* 11.0*  HCT 35.4* 33.0* 35.0* 35.7*  MCV 95.7  --  95.1 95.7  PLT 345  --  326 333   Basic Metabolic Panel: Recent Labs  Lab 03/23/21 1029 03/23/21 1034 03/23/21 1255 03/23/21 2248 03/24/21 0155 03/24/21 1544 03/25/21 0047  NA 129* 130*  --  135 135 135 133*  K 4.6 4.5  --  3.5 4.0 4.5 4.4  CL 97* 102  --  104 104 100 104  CO2 16*  --   --  16* 18* 24 16*  GLUCOSE 366* 361*  --  198* 120* 149* 215*  BUN 67* 61*  --  70* 71* 71* 78*  CREATININE 2.45* 2.50*  --  2.69* 2.64* 2.43* 2.17*   CALCIUM 8.5*  --   --  8.3* 8.5* 8.5* 8.2*  MG  --   --  2.3 2.6* 2.7*  --  2.5*  PHOS  --   --   --   --  4.4  --  4.9*   GFR: Estimated Creatinine Clearance: 30.5 mL/min (A) (by C-G formula based on SCr of 2.17 mg/dL (H)). Recent Labs  Lab 03/23/21 1029 03/23/21 1255 03/23/21 2248 03/24/21 0155 03/24/21 1544 03/25/21 0047  WBC 16.6*  --   --  39.8*  --  22.6*  LATICACIDVEN 5.2* 6.7* 5.5* 4.3* 2.0*  --    Liver Function Tests: Recent Labs  Lab 03/23/21 1029 03/23/21 2248 03/24/21 0155  AST 41 47* 52*  ALT _0 ALKPHOS 83 69 77  BILITOT 1.0 0.6 0.5  PROT 6.9 6.1* 6.5  ALBUMIN 2.5* 2.1* 2.2*   No results for input(s): LIPASE, AMYLASE in the last 168 hours. No results for input(s): AMMONIA in the last 168 hours.  ABG:    Component Value Date/Time   PHART 7.405 09/25/2017 1633   PCO2ART 33.4 09/25/2017 1633   PO2ART 105.0 09/25/2017 1633   HCO3 17.9 (L) 03/23/2021 2235   TCO2 16 (L) 03/23/2021 1034   ACIDBASEDEF 8.2 (H) 03/23/2021 2235   O2SAT 45.3 03/23/2021 2235    Coagulation Profile: No results for input(s): INR, PROTIME in the last 168 hours.  Cardiac Enzymes: No results for input(s): CKTOTAL, CKMB, CKMBINDEX, TROPONINI in the last 168 hours.  HbA1C: Hemoglobin A1C  Date/Time Value Ref Range Status  08/11/2015 12:00 AM 5.6  Final   Hgb A1c MFr Bld  Date/Time Value Ref Range Status  03/23/2021 02:51 PM 7.0 (H) 4.8 - 5.6 % Final    Comment:    (NOTE) Pre diabetes:          5.7%-6.4%  Diabetes:              >6.4%  Glycemic control for   <7.0% adults with diabetes   09/28/2017 04:54 AM 7.9 (H) 4.8 - 5.6 % Final    Comment:    (NOTE) Pre diabetes:          5.7%-6.4% Diabetes:              >  6.4% Glycemic control for   <7.0% adults with diabetes    CBG: Recent Labs  Lab 03/24/21 1142 03/24/21 1532 03/24/21 2103 03/24/21 2316 03/25/21 0337  GLUCAP 150* 162* 197* 204* 213*    Review of Systems:   Denies SOB, Denies Pain, Denies  nausea/Vomiting. Slight chest discomfort with cough   Past Medical History:  He,  has a past medical history of Abdominal hernia, Abscess, abdomen (12/31/2010), Anemia, Anxiety, AVM (arteriovenous malformation), Carotid artery disease ([place]), Chronic diastolic CHF (congestive heart failure) ([place]), Chronic low back pain, COPD (chronic obstructive pulmonary disease) ([place]), CVA (cerebral infarction) (09/2010), Diabetes mellitus, GERD (gastroesophageal reflux disease), History of nuclear stress test, echocardiogram, Hyperlipidemia, Hypertension, Intellectual disability, Itching, Lung nodule, Myocardial infarction ([place]) (2016 ?), Obesity, Oxygen dependent, PAF (paroxysmal atrial fibrillation) ([place]) (06/2009), Pneumonia, Stroke ([place]), Tobacco user, Tubular adenoma of colon, and Wears glasses.   Surgical History:   Past Surgical History:  Procedure Laterality Date   AMPUTATION Right 03/29/2013   Procedure: AMPUTATION RAY;  Surgeon: Newt Minion, MD;  Location: [place];  Service: Orthopedics;  Laterality: Right;  Right Foot 3rd and Possible 4th Ray Amputation   AMPUTATION Right 04/23/2013   Procedure: AMPUTATION RIGHT MID-FOOT;  Surgeon: Newt Minion, MD;  Location: [place];  Service: Orthopedics;  Laterality: Right;   arm surgery Left    as a child   CARDIAC CATHETERIZATION N/A 04/30/2015   Procedure: Left Heart Cath and Coronary Angiography;  Surgeon: Belva Crome, MD;  Location: [place] [facility];  Service: Cardiovascular;  Laterality: N/A;   Carotid arteriogram  10/2010   30% right ICA stenosis, 40% left ICA stenosis    CAROTID ENDARTERECTOMY Left 08-13-15   CEA   CATARACT EXTRACTION, BILATERAL     COLONOSCOPY WITH PROPOFOL N/A 06/10/2014   Procedure: COLONOSCOPY WITH PROPOFOL;  Surgeon: Jerene Bears, MD;  Location: [facility];  Service: Gastroenterology;  Laterality: N/A;   CORONARY ARTERY BYPASS GRAFT N/A 08/13/2015   Procedure: CORONARY ARTERY BYPASS GRAFTING (CABG), ON PUMP, TIMES THREE, USING LEFT  INTERNAL MAMMARY ARTERY, RIGHT GREATER SAPHENOUS VEIN HARVESTED ENDOSCOPICALLY;  Surgeon: [name], MD;  Location: [place];  Service: Open Heart Surgery;  Laterality: N/A;   DEBRIDMENT OF DECUBITUS ULCER Right 02/13/2013   ENDARTERECTOMY Left 08/13/2015   Procedure: ENDARTERECTOMY CAROTID;  Surgeon: Angelia Mould, MD;  Location: [place];  Service: Vascular;  Laterality: Left;   ESOPHAGOGASTRODUODENOSCOPY (EGD) WITH PROPOFOL N/A 06/10/2014   Procedure: ESOPHAGOGASTRODUODENOSCOPY (EGD) WITH PROPOFOL;  Surgeon: Jerene Bears, MD;  [facility];  Service: Gastroenterology;  Laterality: N/A;   GIVENS CAPSULE STUDY N/A 07/09/2014   Procedure: GIVENS CAPSULE STUDY;  Surgeon: Jerene Bears, MD;  Location: [facility];  Service: Gastroenterology;  Laterality: N/A;   I & D EXTREMITY Right 02/13/2013   Procedure: IRRIGATION AND DEBRIDEMENT FOOT ULCER;  Surgeon: Johnny Bridge, MD;  Location: [place];  Service: Orthopedics;  Laterality: Right;  PULSE LAVAGE   INSERTION OF DIALYSIS CATHETER N/A 10/02/2017   Procedure: INSERTION OF TUNNELED DIALYSIS CATHETER;  Surgeon: Elam Dutch, MD;  [facility];  Service: Vascular;  Laterality: N/A;   IR REMOVAL TUN CV CATH W/O FL  10/25/2017   MULTIPLE TOOTH EXTRACTIONS     TEE WITHOUT CARDIOVERSION N/A 08/13/2015   Procedure: TRANSESOPHAGEAL ECHOCARDIOGRAM (TEE);  Surgeon: [name], MD;  Location: [place];  Service: Open Heart Surgery;  Laterality: N/A;   TEMPORARY PACEMAKER N/A 09/15/2017   Procedure: TEMPORARY  PACEMAKER;  Surgeon: Jettie Booze, MD;  Location: [place] [facility];  Service: Cardiovascular;  Laterality: N/A;   TEMPORARY PACEMAKER N/A 03/23/2021   Procedure: TEMPORARY PACEMAKER;  Surgeon: Martinique, Peter M, MD;  Location: [place] [facility];  Service: Cardiovascular;  Laterality: N/A;   TRANSESOPHAGEAL ECHOCARDIOGRAM  09/2010   No ASD or PFO. EF 60-65%.  Normal systolic function. No evidence of thrombus.    TRANSTHORACIC  ECHOCARDIOGRAM  09/2010    The cavity size was normal. Systolic function was vigorous.  EF 65-70%.  Normal wall funciton.    ULTRASOUND GUIDANCE FOR VASCULAR ACCESS  09/15/2017   Procedure: Ultrasound Guidance For Vascular Access;  Surgeon: Jettie Booze, MD;  Location: [place] [facility];  Service: Cardiovascular;;    Social History:   reports that he quit smoking about 10 years ago. His smoking use included cigarettes and pipe. He has a 42.00 pack-year smoking history. He quit smokeless tobacco use about 10 years ago. He reports that he does not drink alcohol and does not use drugs.   Family History:  His family history includes Breast cancer in his maternal grandmother; Heart attack in his maternal grandmother; Heart disease in his brother, father, maternal grandmother, and mother; Hypertension in his sister; Peripheral vascular disease in his father; Stomach cancer in his maternal uncle. There is no history of Colon cancer or Stroke.   Allergies: Allergies  Allergen Reactions   Penicillins Hives, Nausea And Vomiting, Swelling and Other (See Comments)    Tolerated Cefepime Has patient had a PCN reaction causing immediate rash, facial/tongue/throat swelling, SOB or lightheadedness with hypotension: YES Has patient had a PCN reaction causing severe rash involving mucus membranes or skin necrosis: No Has patient had a PCN reaction that required hospitalization No Has patient had a PCN reaction occurring within the last 10 years: No If all of the above answers are "NO", then may proceed with Cephalosporin use.     Home Medications: Prior to Admission medications   Medication Sig Start Date End Date Taking? Authorizing Provider  acetaminophen (TYLENOL) 325 MG tablet Take 650 mg by mouth 3 (three) times daily as needed for mild pain.   Yes [provider]  apixaban (ELIQUIS) 2.5 MG TABS tablet Take 2.5 mg by mouth 2 (two) times daily.   Yes [provider]  aspirin 81  MG EC tablet Take 81 mg by mouth daily.   Yes [provider]  atorvastatin (LIPITOR) 80 MG tablet Take 1 tablet (80 mg total) by mouth daily. 10/26/17  Yes Colbert Ewing, MD  calcium carbonate (TUMS - DOSED IN MG ELEMENTAL CALCIUM) 500 MG chewable tablet Chew 1 tablet by mouth 3 (three) times daily as needed for indigestion or heartburn.   Yes [provider]  Cholecalciferol (VITAMIN D3) 50 MCG (2000 UT) TABS Take 50 mcg by mouth daily.   Yes [provider]  Darbepoetin Alfa (ARANESP) 150 MCG/0.3ML SOSY injection Inject 0.3 mLs (150 mcg total) into the skin every Wednesday at 6 PM. Patient taking differently: Inject 150 mcg into the skin See admin instructions. Every 8 weeks 11/01/17  Yes Colbert Ewing, MD  furosemide (LASIX) 80 MG tablet Take 1 tablet (80 mg total) by mouth 2 (two) times daily as needed for fluid. 10/26/17 05/15/21 Yes Colbert Ewing, MD  HYDROcodone-acetaminophen (NORCO/VICODIN) 5-325 MG tablet Take 1 tablet by mouth daily.   Yes [provider]  insulin glargine (LANTUS) 100 UNIT/ML injection Inject 0.14 mLs (14 Units total) into  the skin at bedtime. Patient taking differently: Inject 26 Units into the skin daily. 10/26/17  Yes Colbert Ewing, MD  ipratropium-albuterol (DUONEB) 0.5-2.5 (3) MG/3ML SOLN Take 3 mLs by nebulization 2 (two) times daily as needed (COPD).   Yes [provider]  metoprolol succinate (TOPROL-XL) 25 MG 24 hr tablet Take 25 mg by mouth daily.   Yes [provider]  Multiple Vitamin (MULTIVITAMIN) capsule Take 1 capsule by mouth daily.   Yes [provider]  nitroGLYCERIN (NITROSTAT) 0.4 MG SL tablet Place 0.4 mg under the tongue every 5 (five) minutes as needed for chest pain.   Yes [provider]  nystatin (MYCOSTATIN/NYSTOP) powder Apply topically 4 (four) times daily. Apply to inguinal area/scrotum and under pannus Patient taking differently: Apply 1 application topically in the  morning, at noon, and at bedtime. To skin folds to treat intertrigo 03/02/18  Yes Ardis Hughs, MD  OXYGEN Inhale 2.5 L into the lungs See admin instructions. As needed and at bedtime for shortness of breath   Yes [provider]  pantoprazole (PROTONIX) 40 MG tablet Take 40 mg by mouth daily.   Yes [provider]  PARoxetine (PAXIL) 40 MG tablet Take 40 mg by mouth daily.    Yes [provider]  polyvinyl alcohol (LIQUIFILM TEARS) 1.4 % ophthalmic solution Place 1 drop into both eyes 2 (two) times daily as needed for dry eyes. Natural Tears   Yes [provider]  senna-docusate (SENOKOT-S) 8.6-50 MG tablet Take 1 tablet by mouth at bedtime as needed for mild constipation. 10/26/17  Yes Colbert Ewing, MD  tiotropium (SPIRIVA) 18 MCG inhalation capsule Place 18 mcg into inhaler and inhale daily.   Yes [provider]  triamcinolone cream (KENALOG) 0.1 % Apply 1 application topically 2 (two) times daily as needed (legs and other itchy areas).   Yes [provider]  amiodarone (PACERONE) 200 MG tablet Take one tablet by mouth daily Monday through Saturday. Do NOT take on Sunday. Patient not taking: Reported on 03/23/2021 06/11/19   Evans Lance, MD  multivitamin (RENA-VIT) TABS tablet Take 1 tablet by mouth at bedtime. Patient not taking: Reported on 03/23/2021 10/26/17   Colbert Ewing, MD  predniSONE (DELTASONE) 10 MG tablet Take 10-40 mg by mouth See admin instructions. 71m daily for 3 days, then 225mdaily for 3 days, then 1093maily for 3 days. Then stop.    [provider]  risperiDONE (RISPERDAL) 0.5 MG tablet Take 0.5 mg by mouth 2 (two) times daily. Patient not taking: Reported on 03/23/2021    [provider]  traMADol (ULTRAM) 50 MG tablet Take 1-2 tablets (50-100 mg total) by mouth every 6 (six) hours as needed for moderate pain. Patient not taking: Reported on 03/23/2021 03/02/18   HerArdis HughsD     Critical care time: 32 31nutes   KatOmar PersonP [facility] 03/25/21 8:04 AM  Please see [email or web address] for pager details.  From 7A-7P if no response, please call [phone] After hours, please call ELink [phone]

## 2021-03-25 NOTE — Progress Notes (Addendum)
Progress Note  Patient Name: [name] Date of Encounter: 03/25/2021  [facility] Cardiologist: Mertie Moores, MD   Subjective   Denies SOB but a very productive cough, no CP  Inpatient Medications    Scheduled Meds:  apixaban  2.5 mg Oral BID   vitamin C  500 mg Oral Daily   aspirin EC  81 mg Oral Daily   atorvastatin  80 mg Oral Daily   baricitinib  1 mg Oral Daily   Chlorhexidine Gluconate Cloth  6 each Topical Daily   dexamethasone (DECADRON) injection  6 mg Intravenous Q24H   insulin aspart  0-15 Units Subcutaneous Q4H   insulin glargine-yfgn  15 Units Subcutaneous Daily   pantoprazole  40 mg Oral Daily   PARoxetine  40 mg Oral Daily   umeclidinium bromide  1 puff Inhalation Daily   zinc sulfate  220 mg Oral Daily   Continuous Infusions:  cefTRIAXone (ROCEPHIN)  IV Stopped (03/24/21 1041)   doxycycline (VIBRAMYCIN) IV 100 mg (03/24/21 2059)   norepinephrine (LEVOPHED) Adult infusion 5 mcg/min (03/25/21 0504)   remdesivir 100 mg in NS 100 mL Stopped (03/24/21 1158)   PRN Meds: dextrose, docusate sodium, ondansetron (ZOFRAN) IV, polyethylene glycol   Vital Signs    Vitals:   03/25/21 0545 03/25/21 0600 03/25/21 0615 03/25/21 0645  BP: 129/69  100/60 108/88  Pulse: (!) 56 (!) 145 (!) 144 (!) 135  Resp: (!) 27 (!) 26 (!) 28 (!) 26  Temp:      TempSrc:      SpO2: 100% 100% 100% 100%  Weight:      Height:        Intake/Output Summary (Last 24 hours) at 03/25/2021 0804 Last data filed at 03/25/2021 0600 Gross per 24 hour  Intake 1796.67 ml  Output 1115 ml  Net 681.67 ml   Last 3 Weights 03/25/2021 03/24/2021 03/23/2021  Weight (lbs) 189 lb 2.5 oz 188 lb 7.9 oz 185 lb  Weight (kg) 85.8 kg 85.5 kg 83.915 kg      Telemetry    Suspect AFib  130's and having periods of V pacing as well - Personally Reviewed  ECG    No new EKGs - Personally Reviewed  Physical Exam   General Appearance: In no apparent distress, laying in bed, older than his age,  chronically ill HEENT: Normocephalic, atraumatic.  Neck: Neck is short and thick, difficult assess JVD Cardiovascular: Irregularly irregular, no murmur Respiratory: coughing, scattered rhonchi bilaterally, on n/c Gastrointestinal: Large abdominal mass noted to the right  Extremities: s/p right foot partial amputation, trace edema of BLE Genitourinary: Foley is draining cloudy urine  Musculoskeletal: Generalized muscular atrophy  Skin: Numerous skin tears and bruise of abdominal and extremities   Neurologic:AAO to self, place and situation Psychiatric: very pleasant and cooperative  Labs    High Sensitivity Troponin:   Recent Labs  Lab 03/23/21 1029 03/23/21 1451  TROPONINIHS 466* 701*      Chemistry Recent Labs  Lab 03/23/21 1029 03/23/21 1034 03/23/21 2248 03/24/21 0155 03/24/21 1544 03/25/21 0047  NA 129*   < > 135 135 135 133*  K 4.6   < > 3.5 4.0 4.5 4.4  CL 97*   < > 104 104 100 104  CO2 16*  --  16* 18* 24 16*  GLUCOSE 366*   < > 198* 120* 149* 215*  BUN 67*   < > 70* 71* 71* 78*  CREATININE 2.45*   < > 2.69* 2.64*  2.43* 2.17*  CALCIUM 8.5*  --  8.3* 8.5* 8.5* 8.2*  PROT 6.9  --  6.1* 6.5  --   --   ALBUMIN 2.5*  --  2.1* 2.2*  --   --   AST 41  --  47* 52*  --   --   ALT 20  --  22 23  --   --   ALKPHOS 83  --  69 77  --   --   BILITOT 1.0  --  0.6 0.5  --   --   GFRNONAA 28*  --  25* 26* 29* 33*  ANIONGAP 16*  --  '15 13 11 13   '$ < > = values in this interval not displayed.     Hematology Recent Labs  Lab 03/23/21 1029 03/23/21 1034 03/24/21 0155 03/25/21 0047  WBC 16.6*  --  39.8* 22.6*  RBC 3.70*  --  3.68* 3.73*  HGB 11.3* 11.2* 11.1* 11.0*  HCT 35.4* 33.0* 35.0* 35.7*  MCV 95.7  --  95.1 95.7  MCH 30.5  --  30.2 29.5  MCHC 31.9  --  31.7 30.8  RDW 16.4*  --  16.5* 16.7*  PLT 345  --  326 217    BNPNo results for input(s): BNP, PROBNP in the last 168 hours.   DDimer No results for input(s): DDIMER in the last 168 hours.   Radiology     DG Chest 2 View Result Date: 03/22/2021 CLINICAL DATA:  Decreased breath sounds at right lung base. Chest pain with nonproductive cough for 2 weeks. EXAM: CHEST - 2 VIEW COMPARISON:  Radiograph 06/30/2020. FINDINGS: Similar low lung volumes. Patient is post median sternotomy. Similar cardiomegaly with aortic atherosclerosis and tortuosity there is patchy airspace disease at the right lung base, new from prior exam. Background peribronchial and interstitial thickening is similar. Minor subsegmental atelectasis at the left lung base. Suspected small right pleural effusion. No pneumothorax. Scoliotic curvature of the spine. IMPRESSION: 1. Patchy airspace disease at the right lung base, suspicious for pneumonia. Followup PA and lateral chest X-ray is recommended in 3-4 weeks following trial of antibiotic therapy to ensure resolution and exclude underlying malignancy. 2. Suspected small right pleural effusion. 3. Stable cardiomegaly. Stable mild diffuse interstitial and bronchial thickening suggesting chronic lung disease, possible COPD. Electronically Signed   By: Keith Rake M.D.   On: 03/22/2021 23:51   DG CHEST PORT 1 VIEW Result Date: 03/23/2021 CLINICAL DATA:  Septic shock bradycardia EXAM: PORTABLE CHEST 1 VIEW COMPARISON:  03/23/2021, 03/22/2021 FINDINGS: Post sternotomy changes. Cardiomegaly with small right-sided pleural effusion. Worsening airspace disease at the right base. Aortic atherosclerosis. No pneumothorax. IMPRESSION: 1. Cardiomegaly with small pleural effusion and worsening edema or pneumonia at the right lung base. Electronically Signed   By: Donavan Foil M.D.   On: 03/23/2021 22:17       Cardiac Studies    03/23/21 TTE IMPRESSIONS   1. Left ventricular ejection fraction, by estimation, is 25 to 30%. The  left ventricle has severely decreased function. The left ventricle  demonstrates global hypokinesis. The left ventricular internal cavity size  was moderately dilated.    2. Right ventricular systolic function was not well visualized. The right  ventricular size is not well visualized. There is moderately elevated  pulmonary artery systolic pressure.   3. Left atrial size was moderately dilated.   4. Right atrial size was moderately dilated.   5. The mitral valve is abnormal. Mild mitral valve regurgitation. No  evidence of mitral stenosis. Moderate mitral annular calcification.   6. The aortic valve was not well visualized. Aortic valve regurgitation  is not visualized. Mild to moderate aortic valve sclerosis/calcification  is present, without any evidence of aortic stenosis.   7. The inferior vena cava is normal in size with greater than 50%  respiratory variability, suggesting right atrial pressure of 3 mmHg.    09/18/2017; TTE Study Conclusions  - Left ventricle: The cavity size was normal. Wall thickness was    increased in a pattern of mild LVH. Indeterminant diastolic    function, atrial fibrillation. Systolic function was mildly to    moderately reduced. The estimated ejection fraction was in the    range of 40% to 45%. Diffuse hypokinesis.  - Aortic valve: There was no stenosis.  - Mitral valve: Moderately calcified annulus. There was no    significant regurgitation.  - Left atrium: The atrium was mildly dilated.  - Right ventricle: The cavity size was mildly to moderately    dilated. Pacer wire or catheter noted in right ventricle.    Systolic function was mildly reduced.  - Right atrium: The atrium was mildly dilated.  - Tricuspid valve: Peak RV-RA gradient (S): 32 mm Hg.  - Pulmonary arteries: PA peak pressure: 35 mm Hg (S).  - Inferior vena cava: The vessel was normal in size. The    respirophasic diameter changes were in the normal range (>= 50%),    consistent with normal central venous pressure.   Impressions:  - The patient was in atrial fibrillation. Normal LV size with mild    LV hypertrophy. EF 40-45%. Mild-moderate RV dilation  with mildly    decreased systolic function. Borderline pulmonary hypertension.    Patient Profile     65 y.o. male  with a hx of some degree of developmental delay, cared for by his sister, CAD (CABG 2017), chronic CHF (diastolic), PVD (s/p LCEA 0000000 > non-obstructive carotid disease since, follows with [facility]), h/o partial foot amputations 2/2 diabetic ulcer (2014),  COPD (home O2), prior stroke, DM, HTN, HLD, AFib, large abd hernia/mass   Had a syncopal event with EMS observed to have period of asystole and in the ER recurrent long/symptomatic pauses/asystole intermittent compressions > epi gtt > temp pacing wire  COVID + (+/-pneumonia)  Cardiology called and did not think he would be a good candidate for pacer given comorbidities, poor hygiene, skin wounds, and infection risk and concerns of post procedure ability to follow restrictions  Subsequently conversations with CCM and family planned for palliative eval, goals of care, and monitoring clinical progress   EP was asked to weigh in   pacing on telemetry off Epi  Levo at 5 >> off this AM   Labs today K+ 4.4 BUN/Creat 78/2.17 Mag 2.5 Lactic acid 5.5 > 4.3 >> 2.0 WBC 39.8 >> 22.6 H/H 11/35 Plts 217   Assessment & Plan    Syncope recurrent pauses/asystolic events on telemetry > temp pacing wire Out patient med list includes Toprol '25mg'$  daily (? not amiodarone) He is pacing on telemetry  2. PAFib CHA2DS2Vasc is 7, back on his eliquis 2.'5mg'$  BID (appropriately dosed)    The patient is being treated for sepsis, COVID pneumonia Having intermittent AFib w/RVR and also pacing 50 O2 requirments are improving Pt is AAO to self, place situation Off small Toprol  Levophed stopped this AM  Palliative care is on board, planned for sull code for now with plans to follow clinically for 48hours  prior to any further decisions.  Clinically he seems to be improving though remains ill and not ready for pacing today If he needs  a pacer he is agreeable He remains a high infection risk with several skin wounds/tears  I will revisit with Dr. Rayann Heman    For questions or updates, please contact [place] Please consult [email or web address] for contact info under        Signed, Baldwin Jamaica, PA-C  03/25/2021, 8:04 AM     I have seen and reviewed the above assessment and plan.  Changes to above are made where necessary.  He remains quite ill but is improving.  Not currently well enough for pacing.  Unfortunately, I do not feel that he is likely to be ready tomorrow.  Will anticipate likely pacemaker implant on Monday if he continues to improve.  Co Sign: Thompson Grayer, MD 03/25/2021 4:54 PM

## 2021-03-25 NOTE — Progress Notes (Signed)
ANTICOAGULATION CONSULT NOTE - Initial Consult  Pharmacy Consult for Eliquis to heparin Indication: atrial fibrillation  Allergies  Allergen Reactions   Penicillins Hives, Nausea And Vomiting, Swelling and Other (See Comments)    Tolerated Cefepime Has patient had a PCN reaction causing immediate rash, facial/tongue/throat swelling, SOB or lightheadedness with hypotension: YES Has patient had a PCN reaction causing severe rash involving mucus membranes or skin necrosis: No Has patient had a PCN reaction that required hospitalization No Has patient had a PCN reaction occurring within the last 10 years: No If all of the above answers are "NO", then may proceed with Cephalosporin use.     Patient Measurements: Height: 5' (152.4 cm) Weight: 85.8 kg (189 lb 2.5 oz) IBW/kg (Calculated) : 50 Heparin Dosing Weight: 68.9 kg  Vital Signs: BP: 160/122 (08/18 1500) Pulse Rate: 130 (08/18 1500)  Labs: Recent Labs    03/23/21 1029 03/23/21 1034 03/23/21 1451 03/23/21 2248 03/24/21 0155 03/24/21 1544 03/25/21 0047  HGB 11.3* 11.2*  --   --  11.1*  --  11.0*  HCT 35.4* 33.0*  --   --  35.0*  --  35.7*  PLT 345  --   --   --  326  --  217  CREATININE 2.45* 2.50*  --    < > 2.64* 2.43* 2.17*  TROPONINIHS 466*  --  701*  --   --   --   --    < > = values in this interval not displayed.     Estimated Creatinine Clearance: 30.5 mL/min (A) (by C-G formula based on SCr of 2.17 mg/dL (H)).   Medical History: Past Medical History:  Diagnosis Date   Abdominal hernia    Chronic, not a good surgical candidate   Abscess, abdomen 12/31/2010   Referred to Wound Care in 01/2011 because of multiple abd abscess with VERY large ventral hernia (please look at image of CT abd/pelvis 09/2010).  Because of hernia I was hesitant to I&D.       Anemia    History of Iron Def Anemia   Anxiety    AVM (arteriovenous malformation)    chronic GI blood loss   Carotid artery disease (HCC)    s/p CEA    Chronic diastolic CHF (congestive heart failure) (HCC)    takes Lasix   Chronic low back pain    COPD (chronic obstructive pulmonary disease) (HCC)    CVA (cerebral infarction) 09/2010   Bilateral with Left > Right   Diabetes mellitus    GERD (gastroesophageal reflux disease)    History of nuclear stress test    Myoview 9/16:  Inferior, apical and inf-lateral ischemia; not gated; High Risk   Hx of echocardiogram    Echo 5/16:  Mild LVH, EF 55%, indeterm. diast function, WMA could not be ruled out, MAC, trivial MR, mild LAE, normal RVF //  b. Echo 4/17: EF 55-60%, no RWMA, Gr 2 DD, Ao sclerosis, MAC, mild MS, mild LAE, PASP 55 mHg   Hyperlipidemia    Hypertension    Intellectual disability    Sister helps to take care of him and takes him to appts   Itching    all over body; pt scratches and has sores on bilateral arms and abdomen   Lung nodule    Myocardial infarction [facility]) 2016 ?   Heart attack  (  Per  pt. )   Obesity    Oxygen dependent    wears 2 liters at bedtime and  when needed   PAF (paroxysmal atrial fibrillation) ([place]) 06/2009   CHADS score 2 (HTN, DM) // Pradaxa for Afib // Pradaxa stopped due to worsening anemia (Hgb in 5/18 8.5 >> Pradaxa remains on hold)    Pneumonia    hx of   Stroke ([place])    Tobacco user    Smokes 1ppd for multiple years.  Quit after hosp 09/2010.   Tubular adenoma of colon    Wears glasses     Medications:  Infusions:   cefTRIAXone (ROCEPHIN)  IV Stopped (03/25/21 1128)   doxycycline (VIBRAMYCIN) IV Stopped (03/25/21 1046)   norepinephrine (LEVOPHED) Adult infusion Stopped (03/25/21 0744)   remdesivir 100 mg in NS 100 mL Stopped (03/25/21 1337)    Assessment: 65 yo M on Eliquis for paroxsymal AF. Will monitor with both aPTT and HL until they correlate. Had 1 dose of Eliquis this morning.  Hgb and plts stable.  Goal of Therapy:  Heparin level 0.3-0.7 units/ml aPTT 66-102 seconds Monitor platelets by anticoagulation protocol: Yes    Plan:  No bolus Start heparin infusion at 1000 units/hr (10 mL/hr) at 2100.  Check anti-Xa and HL level in 6 hours and daily while on heparin Continue to monitor H&H and platelets  Anderson Malta A Azyria Osmon 03/25/2021,4:09 PM

## 2021-03-25 NOTE — Progress Notes (Signed)
Inpatient Diabetes Program Recommendations  AACE/ADA: New Consensus Statement on Inpatient Glycemic Control  Target Ranges:  Prepandial:   less than 140 mg/dL      Peak postprandial:   less than 180 mg/dL (1-2 hours)      Critically ill patients:  140 - 180 mg/dL   Results for [name], [name] (MRN [id number]) as of 03/25/2021 09:17  Ref. Range 03/24/2021 08:22 03/24/2021 09:25 03/24/2021 10:17 03/24/2021 11:42 03/24/2021 15:32 03/24/2021 21:03 03/24/2021 23:16 03/25/2021 03:37  Glucose-Capillary Latest Ref Range: 70 - 99 mg/dL 131 (H) 107 (H) 104 (H) 150 (H) 162 (H) 197 (H) 204 (H) 213 (H)    Review of Glycemic Control  Diabetes history: DM2 Outpatient Diabetes medications: Lantus 26 units daily Current orders for Inpatient glycemic control: Semglee 15 units daily, Novolog 15 units Q4H  Inpatient Diabetes Program Recommendations:    Insulin: Please consider increasing Semglee to 18 units daily and ordering Novolog 3 units TID with meals for meal coverage if patient eats at least 50% of meals.  Thanks, Barnie Alderman, RN, MSN, CDE Diabetes Coordinator Inpatient Diabetes Program [phone] (Team Pager from 8am to 5pm)

## 2021-03-25 NOTE — Progress Notes (Signed)
eLink Physician-Brief Progress Note Patient Name: [name] DOB: Jun 03, 1956 MRN: [id number]   Date of Service  03/25/2021  HPI/Events of Note  Patient with tachy-brady syndrome s/p multiple brady-cardiac arrests and currently with a temporary pacing wire in place, he is being followed by cardiology. Had an episode of atrial fibrillation with RVR, but currently paced at a rate of 56.  eICU Interventions  Will defer rhythm management to in-house cardiology, no intervention indicated at this time.        Kerry Kass Jonael Paradiso 03/25/2021, 3:35 AM

## 2021-03-25 NOTE — Progress Notes (Signed)
Elink nurse notified of patien t had Afib RVR with HR -140-150. Levo @ 5 mcg. Pt has temporary pacing wire with VVI at rate of 56.paged in house cardiology

## 2021-03-26 ENCOUNTER — Inpatient Hospital Stay (HOSPITAL_COMMUNITY): Payer: Medicare (Managed Care)

## 2021-03-26 ENCOUNTER — Inpatient Hospital Stay (HOSPITAL_COMMUNITY)
Admission: EM | Disposition: A | Discharge status: Skilled Nursing Facility | Payer: Self-pay | Source: Ambulatory Visit | Attending: Internal Medicine

## 2021-03-26 DIAGNOSIS — Z515 Encounter for palliative care: Secondary | ICD-10-CM

## 2021-03-26 DIAGNOSIS — I442 Atrioventricular block, complete: Secondary | ICD-10-CM

## 2021-03-26 DIAGNOSIS — Z8673 Personal history of transient ischemic attack (TIA), and cerebral infarction without residual deficits: Secondary | ICD-10-CM

## 2021-03-26 DIAGNOSIS — A419 Sepsis, unspecified organism: Secondary | ICD-10-CM | POA: Diagnosis not present

## 2021-03-26 DIAGNOSIS — Z7189 Other specified counseling: Secondary | ICD-10-CM

## 2021-03-26 DIAGNOSIS — R6521 Severe sepsis with septic shock: Secondary | ICD-10-CM

## 2021-03-26 DIAGNOSIS — I5022 Chronic systolic (congestive) heart failure: Secondary | ICD-10-CM

## 2021-03-26 DIAGNOSIS — N189 Chronic kidney disease, unspecified: Secondary | ICD-10-CM | POA: Diagnosis not present

## 2021-03-26 DIAGNOSIS — Z789 Other specified health status: Secondary | ICD-10-CM

## 2021-03-26 DIAGNOSIS — I469 Cardiac arrest, cause unspecified: Secondary | ICD-10-CM | POA: Diagnosis not present

## 2021-03-26 HISTORY — PX: PACEMAKER IMPLANT: EP1218

## 2021-03-26 LAB — BASIC METABOLIC PANEL
Anion gap: 11 (ref 5–15)
BUN: 81 mg/dL — ABNORMAL HIGH (ref 8–23)
CO2: 19 mmol/L — ABNORMAL LOW (ref 22–32)
Calcium: 8.2 mg/dL — ABNORMAL LOW (ref 8.9–10.3)
Chloride: 105 mmol/L (ref 98–111)
Creatinine, Ser: 2.01 mg/dL — ABNORMAL HIGH (ref 0.61–1.24)
GFR, Estimated: 36 mL/min — ABNORMAL LOW (ref >60)
Glucose, Bld: 129 mg/dL — ABNORMAL HIGH (ref 70–99)
Potassium: 4.7 mmol/L (ref 3.5–5.1)
Sodium: 135 mmol/L (ref 135–145)

## 2021-03-26 LAB — HEPATIC FUNCTION PANEL
ALT: 23 U/L (ref 0–44)
AST: 26 U/L (ref 15–41)
Albumin: 2.3 g/dL — ABNORMAL LOW (ref 3.5–5.0)
Alkaline Phosphatase: 69 U/L (ref 38–126)
Bilirubin, Direct: 0.1 mg/dL (ref 0.0–0.2)
Indirect Bilirubin: 0.4 mg/dL (ref 0.3–0.9)
Total Bilirubin: 0.5 mg/dL (ref 0.3–1.2)
Total Protein: 5.9 g/dL — ABNORMAL LOW (ref 6.5–8.1)

## 2021-03-26 LAB — MAGNESIUM: Magnesium: 2.3 mg/dL (ref 1.7–2.4)

## 2021-03-26 LAB — GLUCOSE, CAPILLARY
Glucose-Capillary: 119 mg/dL — ABNORMAL HIGH (ref 70–99)
Glucose-Capillary: 101 mg/dL — ABNORMAL HIGH (ref 70–99)
Glucose-Capillary: 127 mg/dL — ABNORMAL HIGH (ref 70–99)

## 2021-03-26 LAB — CBC
HCT: 31.8 % — ABNORMAL LOW (ref 39.0–52.0)
Hemoglobin: 9.9 g/dL — ABNORMAL LOW (ref 13.0–17.0)
MCH: 29.5 pg (ref 26.0–34.0)
MCHC: 31.1 g/dL (ref 30.0–36.0)
MCV: 94.6 fL (ref 80.0–100.0)
Platelets: 184 10*3/uL (ref 150–400)
RBC: 3.36 MIL/uL — ABNORMAL LOW (ref 4.22–5.81)
RDW: 16.5 % — ABNORMAL HIGH (ref 11.5–15.5)
WBC: 12.6 10*3/uL — ABNORMAL HIGH (ref 4.0–10.5)
nRBC: 0 % (ref 0.0–0.2)

## 2021-03-26 LAB — APTT
aPTT: 46 seconds — ABNORMAL HIGH (ref 24–36)
aPTT: 54 seconds — ABNORMAL HIGH (ref 24–36)

## 2021-03-26 LAB — ECHOCARDIOGRAM LIMITED
Height: 60 in
Weight: 3026.47 oz

## 2021-03-26 LAB — HEPARIN LEVEL (UNFRACTIONATED): Heparin Unfractionated: >1.1 IU/mL — ABNORMAL HIGH (ref 0.30–0.70)

## 2021-03-26 LAB — PHOSPHORUS: Phosphorus: 4.9 mg/dL — ABNORMAL HIGH (ref 2.5–4.6)

## 2021-03-26 SURGERY — PACEMAKER IMPLANT

## 2021-03-26 MED ORDER — SODIUM CHLORIDE 0.9 % IV SOLN
100.0000 mg | Freq: Once | INTRAVENOUS | Status: AC
  Administered 2021-03-26: 100 mg via INTRAVENOUS
  Filled 2021-03-26: qty 100

## 2021-03-26 MED ORDER — SODIUM CHLORIDE 0.9% FLUSH: 3.0000 mL | INTRAVENOUS | Status: DC | PRN

## 2021-03-26 MED ORDER — CHLORHEXIDINE GLUCONATE 4 % EX LIQD
60.0000 mL | Freq: Once | CUTANEOUS | Status: AC
  Administered 2021-03-26: 4 via TOPICAL
  Filled 2021-03-26: qty 60

## 2021-03-26 MED ORDER — SODIUM CHLORIDE 0.9 % IV SOLN
80.0000 mg | INTRAVENOUS | Status: AC
  Administered 2021-03-26: 80 mg
  Filled 2021-03-26: qty 2

## 2021-03-26 MED ORDER — CHLORHEXIDINE GLUCONATE 4 % EX LIQD: 60.0000 mL | Freq: Once | CUTANEOUS | Status: DC

## 2021-03-26 MED ORDER — HEPARIN (PORCINE) IN NACL 1000-0.9 UT/500ML-% IV SOLN
INTRAVENOUS | Status: AC
  Filled 2021-03-26: qty 500

## 2021-03-26 MED ORDER — APIXABAN 5 MG PO TABS
5.0000 mg | ORAL_TABLET | Freq: Two times a day (BID) | ORAL | Status: DC
  Administered 2021-03-27 – 2021-04-02 (×13): 5 mg via ORAL
  Filled 2021-03-26 (×13): qty 1

## 2021-03-26 MED ORDER — LIDOCAINE HCL 1 % IJ SOLN
INTRAMUSCULAR | Status: AC
  Filled 2021-03-26: qty 20

## 2021-03-26 MED ORDER — AMIODARONE HCL IN DEXTROSE 360-4.14 MG/200ML-% IV SOLN
INTRAVENOUS | Status: AC
  Administered 2021-03-26: 30 mg/h via INTRAVENOUS
  Filled 2021-03-26: qty 200

## 2021-03-26 MED ORDER — VANCOMYCIN HCL IN DEXTROSE 1-5 GM/200ML-% IV SOLN
1000.0000 mg | INTRAVENOUS | Status: AC
  Administered 2021-03-26: 1000 mg via INTRAVENOUS

## 2021-03-26 MED ORDER — MUPIROCIN 2 % EX OINT
1.0000 "application " | TOPICAL_OINTMENT | Freq: Two times a day (BID) | CUTANEOUS | Status: AC
  Administered 2021-03-26 – 2021-03-30 (×10): 1 via NASAL
  Filled 2021-03-26 (×4): qty 22

## 2021-03-26 MED ORDER — IOHEXOL 350 MG/ML SOLN
INTRAVENOUS | Status: DC | PRN
  Administered 2021-03-26: 5 mL

## 2021-03-26 MED ORDER — MIDAZOLAM HCL 5 MG/5ML IJ SOLN
INTRAMUSCULAR | Status: AC
  Filled 2021-03-26: qty 5

## 2021-03-26 MED ORDER — FENTANYL CITRATE (PF) 100 MCG/2ML IJ SOLN
INTRAMUSCULAR | Status: AC
  Filled 2021-03-26: qty 2

## 2021-03-26 MED ORDER — SODIUM CHLORIDE 0.9% FLUSH
3.0000 mL | Freq: Two times a day (BID) | INTRAVENOUS | Status: DC
  Administered 2021-03-26 – 2021-04-02 (×14): 3 mL via INTRAVENOUS

## 2021-03-26 MED ORDER — DOXYCYCLINE HYCLATE 100 MG PO TABS
100.0000 mg | ORAL_TABLET | Freq: Two times a day (BID) | ORAL | Status: AC
  Administered 2021-03-26 – 2021-03-28 (×4): 100 mg via ORAL
  Filled 2021-03-26 (×4): qty 1

## 2021-03-26 MED ORDER — SODIUM CHLORIDE 0.9 % IV SOLN
INTRAVENOUS | Status: AC
  Filled 2021-03-26: qty 2

## 2021-03-26 MED ORDER — SODIUM CHLORIDE 0.9 % IV SOLN: 250.0000 mL | INTRAVENOUS | Status: DC

## 2021-03-26 MED ORDER — VANCOMYCIN HCL IN DEXTROSE 1-5 GM/200ML-% IV SOLN
1000.0000 mg | Freq: Two times a day (BID) | INTRAVENOUS | Status: AC
  Administered 2021-03-27: 1000 mg via INTRAVENOUS
  Filled 2021-03-26: qty 200

## 2021-03-26 MED ORDER — CHLORHEXIDINE GLUCONATE CLOTH 2 % EX PADS
6.0000 | MEDICATED_PAD | Freq: Every day | CUTANEOUS | Status: AC
  Administered 2021-03-26 – 2021-03-29 (×5): 6 via TOPICAL

## 2021-03-26 MED ORDER — LIDOCAINE HCL (PF) 1 % IJ SOLN
INTRAMUSCULAR | Status: DC | PRN
  Administered 2021-03-26: 60 mL

## 2021-03-26 MED ORDER — DOXYCYCLINE HYCLATE 100 MG PO TABS: 100.0000 mg | ORAL_TABLET | Freq: Two times a day (BID) | ORAL | Status: DC

## 2021-03-26 MED ORDER — PERFLUTREN LIPID MICROSPHERE
1.0000 mL | INTRAVENOUS | Status: AC | PRN
  Administered 2021-03-26: 2 mL via INTRAVENOUS
  Filled 2021-03-26: qty 10

## 2021-03-26 MED ORDER — MAGNESIUM SULFATE 2 GM/50ML IV SOLN
2.0000 g | Freq: Once | INTRAVENOUS | Status: AC
  Administered 2021-03-26: 2 g via INTRAVENOUS
  Filled 2021-03-26: qty 50

## 2021-03-26 MED ORDER — HEPARIN (PORCINE) IN NACL 1000-0.9 UT/500ML-% IV SOLN
INTRAVENOUS | Status: DC | PRN
  Administered 2021-03-26: 500 mL

## 2021-03-26 MED ORDER — ONDANSETRON HCL 4 MG/2ML IJ SOLN: 4.0000 mg | Freq: Four times a day (QID) | INTRAMUSCULAR | Status: DC | PRN

## 2021-03-26 MED ORDER — AMIODARONE HCL IN DEXTROSE 360-4.14 MG/200ML-% IV SOLN: 30.0000 mg/h | INTRAVENOUS | Status: DC

## 2021-03-26 MED ORDER — SODIUM CHLORIDE 0.9 % IV SOLN: INTRAVENOUS | Status: DC

## 2021-03-26 MED ORDER — FUROSEMIDE 40 MG PO TABS
80.0000 mg | ORAL_TABLET | Freq: Two times a day (BID) | ORAL | Status: DC
  Administered 2021-03-26 – 2021-04-02 (×14): 80 mg via ORAL
  Filled 2021-03-26 (×14): qty 2

## 2021-03-26 MED ORDER — INSULIN GLARGINE-YFGN 100 UNIT/ML ~~LOC~~ SOLN
20.0000 [IU] | Freq: Every day | SUBCUTANEOUS | Status: DC
  Administered 2021-03-27 – 2021-03-29 (×3): 20 [IU] via SUBCUTANEOUS
  Filled 2021-03-26 (×4): qty 0.2

## 2021-03-26 MED ORDER — AMIODARONE LOAD VIA INFUSION
150.0000 mg | Freq: Once | INTRAVENOUS | Status: DC
  Filled 2021-03-26: qty 83.34

## 2021-03-26 MED ORDER — ACETAMINOPHEN 325 MG PO TABS
325.0000 mg | ORAL_TABLET | ORAL | Status: DC | PRN
  Administered 2021-03-26 – 2021-04-02 (×6): 650 mg via ORAL
  Filled 2021-03-26 (×6): qty 2

## 2021-03-26 MED ORDER — VANCOMYCIN HCL IN DEXTROSE 1-5 GM/200ML-% IV SOLN
INTRAVENOUS | Status: AC
  Filled 2021-03-26: qty 200

## 2021-03-26 MED ORDER — FENTANYL CITRATE (PF) 100 MCG/2ML IJ SOLN
50.0000 ug | INTRAMUSCULAR | Status: DC | PRN
  Administered 2021-03-26 – 2021-03-29 (×5): 50 ug via INTRAVENOUS
  Filled 2021-03-26 (×5): qty 2

## 2021-03-26 SURGICAL SUPPLY — 15 items
CABLE SURGICAL S-101-97-12 (CABLE) ×2 IMPLANT
CATH CPS DIRECT 135 DS2C020 (CATHETERS) ×1 IMPLANT
CATH CPS QUART CN DS2N029-65 (CATHETERS) ×1 IMPLANT
CATH CPS QUART CN DS2N030-65 (CATHETERS) ×1 IMPLANT
CPS IMPLANT KIT 410190 (MISCELLANEOUS) ×1 IMPLANT
LEAD QUARTET 1458Q-86CM (Lead) ×1 IMPLANT
LEAD TENDRIL MRI 52CM LPA1200M (Lead) ×1 IMPLANT
LEAD TENDRIL MRI 58CM LPA1200M (Lead) ×1 IMPLANT
PACEMAKER QUDR ALLR CRT PM3562 (Pacemaker) IMPLANT
PMKR QUADRA ALLURE CRT PM3562 (Pacemaker) ×2 IMPLANT
SHEATH 8FR PRELUDE SNAP 13 (SHEATH) ×2 IMPLANT
SHEATH PROBE COVER 6X72 (BAG) ×1 IMPLANT
TRAY PACEMAKER INSERTION (PACKS) ×2 IMPLANT
WIRE ACUITY WHISPER EDS 4648 (WIRE) ×1 IMPLANT
WIRE HI TORQ VERSACORE-J 145CM (WIRE) ×1 IMPLANT

## 2021-03-26 NOTE — Progress Notes (Signed)
NAME:  [name], MRN:  [id number], DOB:  01-30-1956, LOS: 3 ADMISSION DATE:  03/23/2021 CONSULTATION DATE:  03/23/2021 REFERRING MD:  Jeanell Sparrow - EDP CHIEF COMPLAINT:  Asystole, PNA  History of Present Illness:  65 year old man followed by PACE with PMHx significant for mild developmental delay, HTN, HLD, Afib with RVR, tachy-brady syndrome (not a permanent pacemaker candidate), chronic combined systolic/diastolic HF (01/2702 EF 50-09%), CAD (MI 2016, s/p CABG 2017), CVA (2012), COPD (on 2L HOT), T2DM (c/b diabetic neuropathy, s/p R transmetatarsal amputation), CKD stage III. Presented to [facility] ED via EMS 8/16 for syncopal episode vs. episode of unresponsiveness secondary to asystole.   Per EMS report, patient was attempting to exit the bus with his walker when he had a syncopal episode vs. episode of unresponsiveness and fell. Chest compressions were initiated x 1 round and patient became responsive with jerking, clonic movements. Glucose in the field was 350. On arrival to ED, patient was bradycardic and received atropine without improvement. Epi gtt was started and rate/rhythm changed to ST, then VT. Patient continued to have multiple episodes of unresponsiveness/asystole requiring brief CPR vs. stimulation. Cardiology was consulted for recommendations. CXR with c/f RLL PNA, empiric ceftriaxone/azithromycin started.  PCCM was consulted for ICU admission.  Pertinent Medical History:   Past Medical History:  Diagnosis Date   Abdominal hernia    Chronic, not a good surgical candidate   Abscess, abdomen 12/31/2010   Referred to Wound Care in 01/2011 because of multiple abd abscess with VERY large ventral hernia (please look at image of CT abd/pelvis 09/2010).  Because of hernia I was hesitant to I&D.       Anemia    History of Iron Def Anemia   Anxiety    AVM (arteriovenous malformation)    chronic GI blood loss   Carotid artery disease (HCC)    s/p CEA   Chronic diastolic CHF (congestive heart  failure) (HCC)    takes Lasix   Chronic low back pain    COPD (chronic obstructive pulmonary disease) (HCC)    CVA (cerebral infarction) 09/2010   Bilateral with Left > Right   Diabetes mellitus    GERD (gastroesophageal reflux disease)    History of nuclear stress test    Myoview 9/16:  Inferior, apical and inf-lateral ischemia; not gated; High Risk   Hx of echocardiogram    Echo 5/16:  Mild LVH, EF 55%, indeterm. diast function, WMA could not be ruled out, MAC, trivial MR, mild LAE, normal RVF //  b. Echo 4/17: EF 55-60%, no RWMA, Gr 2 DD, Ao sclerosis, MAC, mild MS, mild LAE, PASP 55 mHg   Hyperlipidemia    Hypertension    Intellectual disability    Sister helps to take care of him and takes him to appts   Itching    all over body; pt scratches and has sores on bilateral arms and abdomen   Lung nodule    Myocardial infarction [facility]) 2016 ?   Heart attack  (  Per  pt. )   Obesity    Oxygen dependent    wears 2 liters at bedtime and when needed   PAF (paroxysmal atrial fibrillation) ([place]) 06/2009   CHADS score 2 (HTN, DM) // Pradaxa for Afib // Pradaxa stopped due to worsening anemia (Hgb in 5/18 8.5 >> Pradaxa remains on hold)    Pneumonia    hx of   Stroke ([place])    Tobacco user    Smokes 1ppd for multiple  years.  Quit after hosp 09/2010.   Tubular adenoma of colon    Wears glasses    Significant Hospital Events: Including procedures, antibiotic start and stop dates in addition to other pertinent events   8/16 BIB EMS after patient was exiting the bus and had a syncopal episode/episode of unresponsiveness. Some "jerking" noted as patient became responsive. One round of CPR. In ED, multiple episodes of asystole lasting ~10 seconds requiring 10-20 seconds of CPR each time. Cards/CCM consulted. Epi gtt started. Ceftriaxone/Azithro. Temp pacing wire paced.   Interim History / Subjective:  Some loss of capture overnight with underlying severe bradycardia; no sinus  arrests  Objective:  Blood pressure (!) 135/98, pulse 80, temperature 98.3 F (36.8 C), temperature source Oral, resp. rate 18, height 5' (1.524 m), weight 85.8 kg, SpO2 100 %.        Intake/Output Summary (Last 24 hours) at 03/26/2021 0742 Last data filed at 03/26/2021 0400 Gross per 24 hour  Intake 1484.45 ml  Output 1025 ml  Net 459.45 ml    Filed Weights   03/23/21 1055 03/24/21 0500 03/25/21 0442  Weight: 83.9 kg 85.5 kg 85.8 kg   Physical Examination: Chronically ill man laying in bed Multiple areas of skin breakdown stable R>L rhonci, no accessory muscle use Heart sounds irregular, tachycardic, ext warm Mentating well Ext without edema, +muscle wasting Ventral hernia noted  CBG look good BMP improved LFTs improved CBC benign  Resolved Hospital Problem List     Assessment & Plan:   Sick sinus syndrome with cardiac collapse and asystole Tachy-brady syndrome s/p Temp Pacer, prolong QTC Presented with several episodes of unresponsiveness/asystole requiring compressions/CPR to rouse. Longstanding issues with tachy-brady syndrome Plan - Continue pacemaker, check CXR to assure no migration - Hold on AVB until PPM placed, looks like potentially Monday depending on clinical trajectory  Paroxysmal Afib History of PAF, on Eliquis at home. Plan - Continue heparin (PPM planned) - Cardiac monitoring  CAD - s/p CABG 08/2015 History of MI - 2016 History of CVA - 2012 Chronic combined systolic and diastolic HF Echo 03/7563 EF 40-45%. Takes Lasix 25m BID at home. >> repeat ECHO with EF 25-30 Plan - Cardiology following; GDMT per cardiology - Restart home lasix  History of HTN Hyperlipidemia Plan - Continue statin  Acute Hypoxic Respiratory Distress, CXR with small pleural effusion/volume overload  COPD - on 2L HOT CAP, +COVID  Plan - Continue supplemental O2 - Wean O2 for sat > 88% - Pulmonary hygiene - Bronchodilators - Mucinex and PRN Robitussin  -  Empiric ceftriaxone/doxycycline for CAP - Continue Remdesivir/Bari/Decadron - f/u CXR   T2DM Diabetic peripheral neuropathy - S/p R transmetatarsal amputation Plan - Semglee 20U daily - SSI - CBG Q4H  CKD stage IIIa Baseline Cr 2.5-3.2.  Currently 2. Plan  - Trend BMP - Replete electrolytes as indicated - Monitor I&Os - Avoid nephrotoxic agents as able - Ensure adequate renal perfusion  Mild developmental delay - Patient's sister is HCPOA  Best Practice: (right click and "Reselect all SmartList Selections" daily)   Diet/type: Thickened Liquids  DVT prophylaxis: heparin gtt GI prophylaxis: PPI Lines: N/A Foley:  Yes, and it is still needed Code Status:  full code Last date of multidisciplinary goals of care discussion: Ongoing.   DErskine EmeryMD PCCM

## 2021-03-26 NOTE — Progress Notes (Signed)
ANTICOAGULATION CONSULT NOTE - Follow-Up Consult  Pharmacy Consult for Eliquis to heparin Indication: atrial fibrillation  Allergies  Allergen Reactions   Penicillins Hives, Nausea And Vomiting, Swelling and Other (See Comments)    Tolerated Cefepime Has patient had a PCN reaction causing immediate rash, facial/tongue/throat swelling, SOB or lightheadedness with hypotension: YES Has patient had a PCN reaction causing severe rash involving mucus membranes or skin necrosis: No Has patient had a PCN reaction that required hospitalization No Has patient had a PCN reaction occurring within the last 10 years: No If all of the above answers are "NO", then may proceed with Cephalosporin use.     Patient Measurements: Height: 5' (152.4 cm) Weight: 85.8 kg (189 lb 2.5 oz) IBW/kg (Calculated) : 50 Heparin Dosing Weight: 68.9 kg  Vital Signs: Temp: 98.3 F (36.8 C) (08/19 0400) Temp Source: Oral (08/19 0400) BP: 135/98 (08/19 0400) Pulse Rate: 80 (08/19 0400)  Labs: Recent Labs    03/23/21 1029 03/23/21 1034 03/23/21 1451 03/23/21 2248 03/24/21 0155 03/24/21 1544 03/25/21 0047 03/26/21 0024 03/26/21 0616  HGB 11.3*   < >  --   --  11.1*  --  11.0* 9.9*  --   HCT 35.4*   < >  --   --  35.0*  --  35.7* 31.8*  --   PLT 345  --   --   --  326  --  217 184  --   APTT  --   --   --   --   --   --   --  46* 54*  HEPARINUNFRC  --   --   --   --   --   --   --  >1.10*  --   CREATININE 2.45*   < >  --    < > 2.64* 2.43* 2.17* 2.01*  --   TROPONINIHS 466*  --  701*  --   --   --   --   --   --    < > = values in this interval not displayed.     Estimated Creatinine Clearance: 32.9 mL/min (A) (by C-G formula based on SCr of 2.01 mg/dL (H)).   Medical History: Past Medical History:  Diagnosis Date   Abdominal hernia    Chronic, not a good surgical candidate   Abscess, abdomen 12/31/2010   Referred to Wound Care in 01/2011 because of multiple abd abscess with VERY large ventral  hernia (please look at image of CT abd/pelvis 09/2010).  Because of hernia I was hesitant to I&D.       Anemia    History of Iron Def Anemia   Anxiety    AVM (arteriovenous malformation)    chronic GI blood loss   Carotid artery disease (HCC)    s/p CEA   Chronic diastolic CHF (congestive heart failure) (HCC)    takes Lasix   Chronic low back pain    COPD (chronic obstructive pulmonary disease) (HCC)    CVA (cerebral infarction) 09/2010   Bilateral with Left > Right   Diabetes mellitus    GERD (gastroesophageal reflux disease)    History of nuclear stress test    Myoview 9/16:  Inferior, apical and inf-lateral ischemia; not gated; High Risk   Hx of echocardiogram    Echo 5/16:  Mild LVH, EF 55%, indeterm. diast function, WMA could not be ruled out, MAC, trivial MR, mild LAE, normal RVF //  b. Echo 4/17: EF 55-60%, no RWMA,  Gr 2 DD, Ao sclerosis, MAC, mild MS, mild LAE, PASP 55 mHg   Hyperlipidemia    Hypertension    Intellectual disability    Sister helps to take care of him and takes him to appts   Itching    all over body; pt scratches and has sores on bilateral arms and abdomen   Lung nodule    Myocardial infarction [facility]) 2016 ?   Heart attack  (  Per  pt. )   Obesity    Oxygen dependent    wears 2 liters at bedtime and when needed   PAF (paroxysmal atrial fibrillation) ([place]) 06/2009   CHADS score 2 (HTN, DM) // Pradaxa for Afib // Pradaxa stopped due to worsening anemia (Hgb in 5/18 8.5 >> Pradaxa remains on hold)    Pneumonia    hx of   Stroke ([place])    Tobacco user    Smokes 1ppd for multiple years.  Quit after hosp 09/2010.   Tubular adenoma of colon    Wears glasses     Medications:  Infusions:   cefTRIAXone (ROCEPHIN)  IV Stopped (03/25/21 1128)   heparin 1,000 Units/hr (03/26/21 0400)   remdesivir 100 mg in NS 100 mL Stopped (03/25/21 1337)    Assessment: 65 yo M on Eliquis for paroxsymal AF. Will monitor with both aPTT and HL until they correlate. Had 1 dose  of Eliquis this morning.  Hgb and plts stable. HL still supratherapeutic (>1.1) due to Eliquis. aPTT subtherapeutic at 54 at 10 units/hr. Hgb dropped from 11>>9.9, but no symptoms of bleeding. Platelets at 184.  Incr 3-4 Goal of Therapy:  Heparin level 0.3-0.7 units/ml aPTT 66-102 seconds Monitor platelets by anticoagulation protocol: Yes   Plan:  No bolus Increase heparin infusion at 1200 units/hr (12 mL/hr).  Check anti-Xa and HL level in 6 hours and daily while on heparin Continue to monitor H&H and platelets  Anderson Malta A Chenelle Benning 03/26/2021,7:46 AM

## 2021-03-26 NOTE — Progress Notes (Signed)
  Echocardiogram 2D Echocardiogram has been performed.  Michiel Cowboy 03/26/2021, 8:44 AM

## 2021-03-26 NOTE — Progress Notes (Addendum)
mAs on TVP increased incrementally from 40m to 271mafter pt had long pause followed by intermittent shorter pauses d/t loss of capture.  All physical connection points were checked prior to setting changes.   Consistent electrical and mechanical capture confirmed s/p changes. Primary RN Rinkuben aware, will pass on to day team.

## 2021-03-26 NOTE — Progress Notes (Signed)
[place] for Eliquis to heparin Indication: atrial fibrillation  Allergies  Allergen Reactions   Penicillins Hives, Nausea And Vomiting, Swelling and Other (See Comments)    Tolerated Cefepime Has patient had a PCN reaction causing immediate rash, facial/tongue/throat swelling, SOB or lightheadedness with hypotension: YES Has patient had a PCN reaction causing severe rash involving mucus membranes or skin necrosis: No Has patient had a PCN reaction that required hospitalization No Has patient had a PCN reaction occurring within the last 10 years: No If all of the above answers are "NO", then may proceed with Cephalosporin use.     Patient Measurements: Height: 5' (152.4 cm) Weight: 85.8 kg (189 lb 2.5 oz) IBW/kg (Calculated) : 50 Heparin Dosing Weight: 68.9 kg  Vital Signs: Temp: 98.2 F (36.8 C) (08/19 0000) Temp Source: Oral (08/19 0000) BP: 106/84 (08/19 0000) Pulse Rate: 121 (08/19 0015)  Labs: Recent Labs    03/23/21 1029 03/23/21 1034 03/23/21 1451 03/23/21 2248 03/24/21 0155 03/24/21 1544 03/25/21 0047 03/26/21 0024  HGB 11.3*   < >  --   --  11.1*  --  11.0* 9.9*  HCT 35.4*   < >  --   --  35.0*  --  35.7* 31.8*  PLT 345  --   --   --  326  --  217 184  APTT  --   --   --   --   --   --   --  46*  HEPARINUNFRC  --   --   --   --   --   --   --  >1.10*  CREATININE 2.45*   < >  --    < > 2.64* 2.43* 2.17*  --   TROPONINIHS 466*  --  701*  --   --   --   --   --    < > = values in this interval not displayed.     Estimated Creatinine Clearance: 30.5 mL/min (A) (by C-G formula based on SCr of 2.17 mg/dL (H)).   Medical History: Past Medical History:  Diagnosis Date   Abdominal hernia    Chronic, not a good surgical candidate   Abscess, abdomen 12/31/2010   Referred to Wound Care in 01/2011 because of multiple abd abscess with VERY large ventral hernia (please look at image of CT abd/pelvis 09/2010).  Because of hernia  I was hesitant to I&D.       Anemia    History of Iron Def Anemia   Anxiety    AVM (arteriovenous malformation)    chronic GI blood loss   Carotid artery disease (HCC)    s/p CEA   Chronic diastolic CHF (congestive heart failure) (HCC)    takes Lasix   Chronic low back pain    COPD (chronic obstructive pulmonary disease) (HCC)    CVA (cerebral infarction) 09/2010   Bilateral with Left > Right   Diabetes mellitus    GERD (gastroesophageal reflux disease)    History of nuclear stress test    Myoview 9/16:  Inferior, apical and inf-lateral ischemia; not gated; High Risk   Hx of echocardiogram    Echo 5/16:  Mild LVH, EF 55%, indeterm. diast function, WMA could not be ruled out, MAC, trivial MR, mild LAE, normal RVF //  b. Echo 4/17: EF 55-60%, no RWMA, Gr 2 DD, Ao sclerosis, MAC, mild MS, mild LAE, PASP 55 mHg   Hyperlipidemia    Hypertension  Intellectual disability    Sister helps to take care of him and takes him to appts   Itching    all over body; pt scratches and has sores on bilateral arms and abdomen   Lung nodule    Myocardial infarction [facility]) 2016 ?   Heart attack  (  Per  pt. )   Obesity    Oxygen dependent    wears 2 liters at bedtime and when needed   PAF (paroxysmal atrial fibrillation) ([place]) 06/2009   CHADS score 2 (HTN, DM) // Pradaxa for Afib // Pradaxa stopped due to worsening anemia (Hgb in 5/18 8.5 >> Pradaxa remains on hold)    Pneumonia    hx of   Stroke ([place])    Tobacco user    Smokes 1ppd for multiple years.  Quit after hosp 09/2010.   Tubular adenoma of colon    Wears glasses     Medications:  Infusions:   cefTRIAXone (ROCEPHIN)  IV Stopped (03/25/21 1128)   doxycycline (VIBRAMYCIN) IV 100 mg (03/25/21 2132)   heparin 1,000 Units/hr (03/26/21 0000)   norepinephrine (LEVOPHED) Adult infusion Stopped (03/25/21 0744)   remdesivir 100 mg in NS 100 mL Stopped (03/25/21 1337)    Assessment: 65 yo M on Eliquis for paroxsymal AF. Now being  transitioned to heparin gtt. Will monitor with both aPTT and HL until they correlate. Had 1 dose of Eliquis 8/18 a.m.  Heparin level >1.1 (affected by Eliquis), aPTT 46 sec (subtherapeutic) on gtt at 1000 units/hr. aPTT drawn ~3 hours post heparin gtt start so likely falsely low.  Goal of Therapy:  Heparin level 0.3-0.7 units/ml aPTT 66-102 seconds Monitor platelets by anticoagulation protocol: Yes   Plan:  Continue heparin infusion at 1000 units/hr Will f/u aPTT 8 hr post heparin gtt start  Sherlon Handing, PharmD, BCPS Please see amion for complete clinical pharmacist phone list 03/26/2021,1:47 AM

## 2021-03-26 NOTE — Interval H&P Note (Signed)
History and Physical Interval Note:  03/26/2021 12:57 PM  [name]  has presented today for surgery, with the diagnosis of heart block.  The various methods of treatment have been discussed with the patient and family. After consideration of risks, benefits and other options for treatment, the patient has consented to  Procedure(s): PACEMAKER IMPLANT (N/A) as a surgical intervention.  The patient's history has been reviewed, patient examined, no change in status, stable for surgery.  I have reviewed the patient's chart and labs.  Questions were answered to the patient's satisfaction.     Lanaiya Lantry [employer]

## 2021-03-26 NOTE — H&P (View-Only) (Signed)
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 Amio gtt was started via  External pacing pads on and was on standby though appeared to be pacing over his intrinsic rhythm Pacer pad remain on and he is hooked up to the zoll, though pacing is off  SR 70's currently  Will check lab status and make dr. Curt Bears aware (in procefdure currently) CCM was at bedside  Tommye Standard, PA-C

## 2021-03-26 NOTE — Progress Notes (Addendum)
Progress Note  Patient Name: [name] Date of Encounter: 03/26/2021  [facility] Cardiologist: Mertie Moores, MD   Subjective   Denies SOB but a very productive cough, no CP  Inpatient Medications    Scheduled Meds:  vitamin C  500 mg Oral Daily   aspirin EC  81 mg Oral Daily   atorvastatin  80 mg Oral Daily   baricitinib  1 mg Oral Daily   Chlorhexidine Gluconate Cloth  6 each Topical Daily   dexamethasone (DECADRON) injection  6 mg Intravenous Q24H   doxycycline  100 mg Oral Q12H   furosemide  80 mg Oral BID   guaiFENesin  1,200 mg Oral BID   insulin aspart  0-20 Units Subcutaneous TID WC   insulin glargine-yfgn  20 Units Subcutaneous Daily   pantoprazole  40 mg Oral Daily   PARoxetine  40 mg Oral Daily   umeclidinium bromide  1 puff Inhalation Daily   zinc sulfate  220 mg Oral Daily   Continuous Infusions:  cefTRIAXone (ROCEPHIN)  IV Stopped (03/25/21 1128)   remdesivir 100 mg in NS 100 mL Stopped (03/25/21 1337)   PRN Meds: dextrose, docusate sodium, guaiFENesin-dextromethorphan, ondansetron (ZOFRAN) IV, polyethylene glycol   Vital Signs    Vitals:   03/26/21 0630 03/26/21 0645 03/26/21 0700 03/26/21 0800  BP: 110/89 (!) 110/98 (!) 129/94   Pulse: (!) 140 (!) 50 60 (!) 55  Resp: 20 (!) 22 (!) 22 (!) 26  Temp:      TempSrc:      SpO2: 100% 100% 100% 100%  Weight:      Height:        Intake/Output Summary (Last 24 hours) at 03/26/2021 0816 Last data filed at 03/26/2021 0700 Gross per 24 hour  Intake 979.35 ml  Output 1125 ml  Net -145.65 ml   Last 3 Weights 03/25/2021 03/24/2021 03/23/2021  Weight (lbs) 189 lb 2.5 oz 188 lb 7.9 oz 185 lb  Weight (kg) 85.8 kg 85.5 kg 83.915 kg      Telemetry    Suspect AFib  130's and having periods of V pacing as well - Personally Reviewed  ECG    No new EKGs - Personally Reviewed  Physical Exam   General Appearance: In no apparent distress, laying in bed, older than his age, chronically ill HEENT:  Normocephalic, atraumatic.  Neck: Neck is short and thick, difficult assess JVD Cardiovascular: Irregularly irregular, no murmur Respiratory: coughing, scattered rhonchi bilaterally, on n/c Gastrointestinal: Large abdominal mass noted to the right  Extremities: s/p right foot partial amputation, trace edema of BLE Genitourinary: Foley is draining cloudy urine  Musculoskeletal: Generalized muscular atrophy  Skin: Numerous skin tears and bruise of abdominal and extremities   Neurologic:AAO to self, place and situation Psychiatric: very pleasant and cooperative  Labs    High Sensitivity Troponin:   Recent Labs  Lab 03/23/21 1029 03/23/21 1451  TROPONINIHS 466* 701*      Chemistry Recent Labs  Lab 03/23/21 2248 03/24/21 0155 03/24/21 1544 03/25/21 0047 03/26/21 0024  NA 135 135 135 133* 135  K 3.5 4.0 4.5 4.4 4.7  CL 104 104 100 104 105  CO2 16* 18* 24 16* 19*  GLUCOSE 198* 120* 149* 215* 129*  BUN 70* 71* 71* 78* 81*  CREATININE 2.69* 2.64* 2.43* 2.17* 2.01*  CALCIUM 8.3* 8.5* 8.5* 8.2* 8.2*  PROT 6.1* 6.5  --   --  5.9*  ALBUMIN 2.1* 2.2*  --   --  2.3*  AST 47* 52*  --   --  26  ALT 22 23  --   --  23  ALKPHOS 69 77  --   --  69  BILITOT 0.6 0.5  --   --  0.5  GFRNONAA 25* 26* 29* 33* 36*  ANIONGAP '15 13 11 13 11     '$ Hematology Recent Labs  Lab 03/24/21 0155 03/25/21 0047 03/26/21 0024  WBC 39.8* 22.6* 12.6*  RBC 3.68* 3.73* 3.36*  HGB 11.1* 11.0* 9.9*  HCT 35.0* 35.7* 31.8*  MCV 95.1 95.7 94.6  MCH 30.2 29.5 29.5  MCHC 31.7 30.8 31.1  RDW 16.5* 16.7* 16.5*  PLT 326 217 184    BNPNo results for input(s): BNP, PROBNP in the last 168 hours.   DDimer No results for input(s): DDIMER in the last 168 hours.   Radiology    DG Chest 2 View Result Date: 03/22/2021 CLINICAL DATA:  Decreased breath sounds at right lung base. Chest pain with nonproductive cough for 2 weeks. EXAM: CHEST - 2 VIEW COMPARISON:  Radiograph 06/30/2020. FINDINGS: Similar low lung  volumes. Patient is post median sternotomy. Similar cardiomegaly with aortic atherosclerosis and tortuosity there is patchy airspace disease at the right lung base, new from prior exam. Background peribronchial and interstitial thickening is similar. Minor subsegmental atelectasis at the left lung base. Suspected small right pleural effusion. No pneumothorax. Scoliotic curvature of the spine. IMPRESSION: 1. Patchy airspace disease at the right lung base, suspicious for pneumonia. Followup PA and lateral chest X-ray is recommended in 3-4 weeks following trial of antibiotic therapy to ensure resolution and exclude underlying malignancy. 2. Suspected small right pleural effusion. 3. Stable cardiomegaly. Stable mild diffuse interstitial and bronchial thickening suggesting chronic lung disease, possible COPD. Electronically Signed   By: Keith Rake M.D.   On: 03/22/2021 23:51   DG CHEST PORT 1 VIEW Result Date: 03/23/2021 CLINICAL DATA:  Septic shock bradycardia EXAM: PORTABLE CHEST 1 VIEW COMPARISON:  03/23/2021, 03/22/2021 FINDINGS: Post sternotomy changes. Cardiomegaly with small right-sided pleural effusion. Worsening airspace disease at the right base. Aortic atherosclerosis. No pneumothorax. IMPRESSION: 1. Cardiomegaly with small pleural effusion and worsening edema or pneumonia at the right lung base. Electronically Signed   By: Donavan Foil M.D.   On: 03/23/2021 22:17       Cardiac Studies    03/23/21 TTE IMPRESSIONS   1. Left ventricular ejection fraction, by estimation, is 25 to 30%. The  left ventricle has severely decreased function. The left ventricle  demonstrates global hypokinesis. The left ventricular internal cavity size  was moderately dilated.   2. Right ventricular systolic function was not well visualized. The right  ventricular size is not well visualized. There is moderately elevated  pulmonary artery systolic pressure.   3. Left atrial size was moderately dilated.   4.  Right atrial size was moderately dilated.   5. The mitral valve is abnormal. Mild mitral valve regurgitation. No  evidence of mitral stenosis. Moderate mitral annular calcification.   6. The aortic valve was not well visualized. Aortic valve regurgitation  is not visualized. Mild to moderate aortic valve sclerosis/calcification  is present, without any evidence of aortic stenosis.   7. The inferior vena cava is normal in size with greater than 50%  respiratory variability, suggesting right atrial pressure of 3 mmHg.    09/18/2017; TTE Study Conclusions  - Left ventricle: The cavity size was normal. Wall thickness was    increased in a pattern of  mild LVH. Indeterminant diastolic    function, atrial fibrillation. Systolic function was mildly to    moderately reduced. The estimated ejection fraction was in the    range of 40% to 45%. Diffuse hypokinesis.  - Aortic valve: There was no stenosis.  - Mitral valve: Moderately calcified annulus. There was no    significant regurgitation.  - Left atrium: The atrium was mildly dilated.  - Right ventricle: The cavity size was mildly to moderately    dilated. Pacer wire or catheter noted in right ventricle.    Systolic function was mildly reduced.  - Right atrium: The atrium was mildly dilated.  - Tricuspid valve: Peak RV-RA gradient (S): 32 mm Hg.  - Pulmonary arteries: PA peak pressure: 35 mm Hg (S).  - Inferior vena cava: The vessel was normal in size. The    respirophasic diameter changes were in the normal range (>= 50%),    consistent with normal central venous pressure.   Impressions:  - The patient was in atrial fibrillation. Normal LV size with mild    LV hypertrophy. EF 40-45%. Mild-moderate RV dilation with mildly    decreased systolic function. Borderline pulmonary hypertension.    Patient Profile     65 y.o. male  with a hx of some degree of developmental delay, cared for by his sister, CAD (CABG 2017), chronic CHF  (diastolic), PVD (s/p LCEA 0000000 > non-obstructive carotid disease since, follows with [facility]), h/o partial foot amputations 2/2 diabetic ulcer (2014),  COPD (home O2), prior stroke, DM, HTN, HLD, AFib, large abd hernia/mass   Had a syncopal event with EMS observed to have period of asystole and in the ER recurrent long/symptomatic pauses/asystole intermittent compressions > epi gtt > temp pacing wire  COVID + (+/-pneumonia)  Cardiology called and did not think he would be a good candidate for pacer given comorbidities, poor hygiene, skin wounds, and infection risk and concerns of post procedure ability to follow restrictions  Subsequently conversations with CCM and family planned for palliative eval, goals of care, and monitoring clinical progress   EP was asked to weigh in  Labs today K+ 4.7 BUN/Creat 78/2.17  >> 19/2.01 Mag 2.3 Lactic acid 5.5 > 4.3 >> 2.0 yesterday WBC 39.8 >> 22.6 >> 12.6 H/H 11/35 >> 9.9/31.8 Plts 217 > 184   Assessment & Plan    Syncope recurrent pauses/asystolic events on telemetry > temp pacing wire Out patient med list includes Toprol '25mg'$  daily (? not amiodarone) He is pacing on telemetry  2. PAFib CHA2DS2Vasc is 7, out pt on his eliquis 2.'5mg'$  BID (inappropriately dosed) >> heparin here Hep gtt stopped this AM in anticipation of pacing  3. COVID  +/- pneumonia Respiratory failure with improving O2 requirements Afebrile WBC steadily improving 12.6 today   The patient is being treated for sepsis, COVID pneumonia Having intermittent AFib w/RVR and also pacing 50 O2 requirments are improving  >> 3L this AM Pt is AAO to self, place situation, denies SOB, less cough, not resolved  Off pressor since yesterday AM   Clinically he continues to improving This AM developed periods of non-capture output of temp wire was increased with improved capture though at 71m continues to have intermittent LOC though no further prolonged periods of  pausing Underlying Afib   I had opportunity yesterday to speak with the patient's PMD, Dr. KBradd Burnerwho knows the patient very well, has seen him bounce back from a number of issues and felt like he had quality of life  and felt if clinically reasonable pursuing PPM from a baseline patient functional status was reasonable.  Discussed with Dr. Rayann Heman ? Reposition temp wire, though his thoughts were to pursue PPM at this juncture and discussed with Dr. Curt Bears.  I have seen and discussed with the patient, the implant procedure and potential risks/benefits, he is agreeable I have also over the phone discussed with his sister [name], his POA, we discussed infection concerns and his increased risks, discussed that he does seem to be improving and she very much would like [place] to pursue PPM implant.  WE discussed the implant procedure, and potential risks/benefits, she remains agreeable and will be in shortly to visit and sign consent.   Echo done post ER after his numerous asystolic and occ CPR episodes noted marked reduction in LVEF Will repeat stat this AM  In review of Dr. Tanna Furry notes, his last note 03/27/20 at that time was on amiodarone and his AFib described as paroxysmal and maintaining SR. In d/w Dr. Amie Critchley, he stopped his amiodarone about a year ago noting AFib. The patient however was in [place] with CHB on arrival and think a dual chamber device is indicated.  Await his echo for final device type decision  ADDEND LVEF remains reduced. Dr. Curt Bears made aware, plan to proceed with PPM and follow EF RBBB at baseline and here D/w Dr. Irish Lack, wound not pursue ischemic evaluation at this juncture, and agreed, PPM was most reasonable plan   For questions or updates, please contact [facility] Please consult [email or web address] for contact info under        Signed, Baldwin [place], PA-C  03/26/2021, 8:16 AM    I have seen and examined this patient with Tommye Standard.  Agree with above,  note added to reflect my findings.  On exam, RRR, no murmurs. Remains pacer dependent. Will plan for permanent pacemaker today.   Will plan CRT-P.  Will M. Camnitz MD 03/26/2021 1:11 PM

## 2021-03-26 NOTE — Discharge Instructions (Signed)
    Supplemental Discharge Instructions for  Pacemaker/Defibrillator Patients   Activity No heavy lifting or vigorous activity with your left/right arm for 6 to 8 weeks.  Do not raise your left/right arm above your head for one week.  Gradually raise your affected arm as drawn below.             03/31/21                    04/01/21                   04/02/21                  04/03/21   WOUND CARE Keep the wound area clean and dry.  Do not get this area wet , no showers for one week; you may shower on     . The tape/steri-strips on your wound will fall off; do not pull them off.  No bandage is needed on the site.  DO  NOT apply any creams, oils, or ointments to the wound area. If you notice any drainage or discharge from the wound, any swelling or bruising at the site, or you develop a fever > 101? F after you are discharged home, call the office at once.  Special Instructions You are still able to use cellular telephones; use the ear opposite the side where you have your pacemaker/defibrillator.  Avoid carrying your cellular phone near your device. When traveling through airports, show security personnel your identification card to avoid being screened in the metal detectors.  Ask the security personnel to use the hand wand. Avoid arc welding equipment, MRI testing (magnetic resonance imaging), TENS units (transcutaneous nerve stimulators).  Call the office for questions about other devices. Avoid electrical appliances that are in poor condition or are not properly grounded. Microwave ovens are safe to be near or to operate.

## 2021-03-26 NOTE — Progress Notes (Signed)
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 Amio gtt was started via  External pacing pads on and was on standby though appeared to be pacing over his intrinsic rhythm Pacer pad remain on and he is hooked up to the zoll, though pacing is off  SR 70's currently  Will check lab status and make dr. Curt Bears aware (in procefdure currently) CCM was at bedside  Tommye Standard, PA-C

## 2021-03-27 ENCOUNTER — Inpatient Hospital Stay (HOSPITAL_COMMUNITY): Payer: Medicare (Managed Care)

## 2021-03-27 DIAGNOSIS — A419 Sepsis, unspecified organism: Secondary | ICD-10-CM | POA: Diagnosis not present

## 2021-03-27 DIAGNOSIS — R6521 Severe sepsis with septic shock: Secondary | ICD-10-CM | POA: Diagnosis not present

## 2021-03-27 DIAGNOSIS — I469 Cardiac arrest, cause unspecified: Secondary | ICD-10-CM | POA: Diagnosis not present

## 2021-03-27 DIAGNOSIS — N189 Chronic kidney disease, unspecified: Secondary | ICD-10-CM | POA: Diagnosis not present

## 2021-03-27 LAB — BASIC METABOLIC PANEL
Anion gap: 8 (ref 5–15)
BUN: 84 mg/dL — ABNORMAL HIGH (ref 8–23)
CO2: 21 mmol/L — ABNORMAL LOW (ref 22–32)
Calcium: 8.4 mg/dL — ABNORMAL LOW (ref 8.9–10.3)
Chloride: 105 mmol/L (ref 98–111)
Creatinine, Ser: 1.77 mg/dL — ABNORMAL HIGH (ref 0.61–1.24)
GFR, Estimated: 42 mL/min — ABNORMAL LOW (ref >60)
Glucose, Bld: 106 mg/dL — ABNORMAL HIGH (ref 70–99)
Potassium: 4.5 mmol/L (ref 3.5–5.1)
Sodium: 134 mmol/L — ABNORMAL LOW (ref 135–145)

## 2021-03-27 LAB — GLUCOSE, CAPILLARY
Glucose-Capillary: 125 mg/dL — ABNORMAL HIGH (ref 70–99)
Glucose-Capillary: 55 mg/dL — ABNORMAL LOW (ref 70–99)
Glucose-Capillary: 123 mg/dL — ABNORMAL HIGH (ref 70–99)
Glucose-Capillary: 233 mg/dL — ABNORMAL HIGH (ref 70–99)

## 2021-03-27 LAB — HEPATIC FUNCTION PANEL
ALT: 21 U/L (ref 0–44)
AST: 23 U/L (ref 15–41)
Albumin: 2.2 g/dL — ABNORMAL LOW (ref 3.5–5.0)
Alkaline Phosphatase: 68 U/L (ref 38–126)
Bilirubin, Direct: 0.1 mg/dL (ref 0.0–0.2)
Indirect Bilirubin: 0.4 mg/dL (ref 0.3–0.9)
Total Bilirubin: 0.5 mg/dL (ref 0.3–1.2)
Total Protein: 5.7 g/dL — ABNORMAL LOW (ref 6.5–8.1)

## 2021-03-27 LAB — APTT: aPTT: 61 seconds — ABNORMAL HIGH (ref 24–36)

## 2021-03-27 LAB — HEPARIN LEVEL (UNFRACTIONATED): Heparin Unfractionated: 0.6 IU/mL (ref 0.30–0.70)

## 2021-03-27 MED ORDER — BARICITINIB 2 MG PO TABS
2.0000 mg | ORAL_TABLET | Freq: Every day | ORAL | Status: DC
  Administered 2021-03-28 – 2021-03-29 (×2): 2 mg via ORAL
  Filled 2021-03-27 (×2): qty 1

## 2021-03-27 MED ORDER — AMIODARONE LOAD VIA INFUSION
150.0000 mg | Freq: Once | INTRAVENOUS | Status: AC
  Administered 2021-03-27: 150 mg via INTRAVENOUS
  Filled 2021-03-27: qty 83.34

## 2021-03-27 MED ORDER — AMIODARONE HCL IN DEXTROSE 360-4.14 MG/200ML-% IV SOLN
INTRAVENOUS | Status: AC
  Administered 2021-03-27: 60 mg/h via INTRAVENOUS
  Filled 2021-03-27: qty 200

## 2021-03-27 MED ORDER — AMIODARONE HCL IN DEXTROSE 360-4.14 MG/200ML-% IV SOLN
60.0000 mg/h | INTRAVENOUS | Status: DC
  Administered 2021-03-27: 60 mg/h via INTRAVENOUS

## 2021-03-27 MED ORDER — AMIODARONE HCL IN DEXTROSE 360-4.14 MG/200ML-% IV SOLN
30.0000 mg/h | INTRAVENOUS | Status: DC
Start: 2021-03-27 — End: 2021-03-28
  Administered 2021-03-28 (×2): 30 mg/h via INTRAVENOUS
  Filled 2021-03-27 (×3): qty 200

## 2021-03-27 NOTE — Plan of Care (Signed)

## 2021-03-27 NOTE — Progress Notes (Signed)
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 Presented to [facility] ED via EMS 8/16 for syncopal episode vs. episode of unresponsiveness secondary to asystole.   Per EMS report, patient was attempting to exit the bus with his walker when he had a syncopal episode vs. episode of unresponsiveness and fell. Chest compressions were initiated x 1 round and patient became responsive with jerking, clonic movements. Glucose in the field was 350. On arrival to ED, patient was bradycardic and received atropine without improvement. Epi gtt was started and rate/rhythm changed to ST, then VT. Patient continued to have multiple episodes of unresponsiveness/asystole requiring brief CPR vs. stimulation. Cardiology was consulted for recommendations. CXR with c/f RLL PNA, empiric ceftriaxone/azithromycin started.  PCCM was consulted for ICU admission.  Pertinent Medical History:   Past Medical History:  Diagnosis Date   Abdominal hernia    Chronic, not a good surgical candidate   Abscess, abdomen 12/31/2010   Referred to Wound Care in 01/2011 because of multiple abd abscess with VERY large ventral hernia (please look at image of CT abd/pelvis 09/2010).  Because of hernia I was hesitant to I&D.       Anemia    History of Iron Def Anemia   Anxiety    AVM (arteriovenous malformation)    chronic GI blood loss   Carotid artery disease (HCC)    s/p CEA   Chronic diastolic CHF (congestive heart  failure) (HCC)    takes Lasix   Chronic low back pain    COPD (chronic obstructive pulmonary disease) (HCC)    CVA (cerebral infarction) 09/2010   Bilateral with Left > Right   Diabetes mellitus    GERD (gastroesophageal reflux disease)    History of nuclear stress test    Myoview 9/16:  Inferior, apical and inf-lateral ischemia; not gated; High Risk   Hx of echocardiogram    Echo 5/16:  Mild LVH, EF 55%, indeterm. diast function, WMA could not be ruled out, MAC, trivial MR, mild LAE, normal RVF //  b. Echo 4/17: EF 55-60%, no RWMA, Gr 2 DD, Ao sclerosis, MAC, mild MS, mild LAE, PASP 55 mHg   Hyperlipidemia    Hypertension    Intellectual disability    Sister helps to take care of him and takes him to appts   Itching    all over body; pt scratches and has sores on bilateral arms and abdomen   Lung nodule    Myocardial infarction [facility]) 2016 ?   Heart attack  (  Per  pt. )   Obesity    Oxygen dependent    wears 2 liters at bedtime and when needed   PAF (paroxysmal atrial fibrillation) ([place]) 06/2009   CHADS score 2 (HTN, DM) // Pradaxa for Afib // Pradaxa stopped due to worsening anemia (Hgb in 5/18 8.5 >> Pradaxa remains on hold)    Pneumonia    hx of   Stroke ([place])    Tobacco user    Smokes 1ppd for multiple  years.  Quit after hosp 09/2010.   Tubular adenoma of colon    Wears glasses    Significant Hospital Events: Including procedures, antibiotic start and stop dates in addition to other pertinent events   8/16 BIB EMS after patient was exiting the bus and had a syncopal episode/episode of unresponsiveness. Some "jerking" noted as patient became responsive. One round of CPR. In ED, multiple episodes of asystole lasting ~10 seconds requiring 10-20 seconds of CPR each time. Cards/CCM consulted. Epi gtt started. Ceftriaxone/Azithro. Temp pacing wire paced.  8/19 PPM as TVP not capturing well  Interim History / Subjective:  Doing much better with PPM On RA Pretty weak from  everything, pain controlled  Objective:  Blood pressure (!) 121/56, pulse (!) 59, temperature (!) 97.5 F (36.4 C), resp. rate 19, height 5' (1.524 m), weight 86.1 kg, SpO2 100 %.        Intake/Output Summary (Last 24 hours) at 03/27/2021 0843 Last data filed at 03/27/2021 0403 Gross per 24 hour  Intake 872.97 ml  Output 830 ml  Net 42.97 ml    Filed Weights   03/24/21 0500 03/25/21 0442 03/27/21 0403  Weight: 85.5 kg 85.8 kg 86.1 kg   Physical Examination: Chronically ill man sipping coffee Maybe some mild developing anasarca Lungs clear Heart sounds regular Diabetic vs. PVD changes of feet Multiple areas of skin breakdown stable  Cr improved CBG okay CBC markedly improved  Resolved Hospital Problem List     Assessment & Plan:   Sick sinus syndrome with cardiac collapse and asystole Tachy-brady syndrome s/p Temp Pacer, prolong QTC Paroxysmal Afib Presented with several episodes of unresponsiveness/asystole requiring compressions/CPR to rouse. Longstanding issues with tachy-brady syndrome.  Now post pacemaker placement doing great! Plan - Post procedural management and arm immobilization per EP - Work on mobility - Continue eliquis  CAD - s/p CABG 08/2015 History of MI - 2016 History of CVA - 2012 Chronic combined systolic and diastolic HF Echo 01/9484 EF 40-45%. Takes Lasix 64m BID at home. >> repeat ECHO with EF 25-30 Plan - Cardiology following; GDMT per cardiology - Continue home lasix  History of HTN Hyperlipidemia Plan - Continue statin  Acute Hypoxic Respiratory Distress, CXR with small pleural effusion/volume overload  COPD - on 2L HOT CAP, +COVID  Plan - Now on RA, may not need O2 at home any more - Completing course for CAP and usual dexamethasone course  T2DM Diabetic peripheral neuropathy - S/p R transmetatarsal amputation Plan - Semglee 20U daily - SSI ACHS - Diabetic diet - Insulin needs may decrease as he comes off  dexamethasone  CKD stage IIIa Baseline Cr 2.5-3.2.  Currently 2. Plan  - Trend BMP - Replete electrolytes as indicated - Monitor I&Os - Avoid nephrotoxic agents as able - Ensure adequate renal perfusion  Physical deconditioning- PT/OT help appreciated  Stable for progressive, appreciate [facility] taking over care starting 03/28/21  DErskine EmeryMD PCCM

## 2021-03-28 ENCOUNTER — Encounter (HOSPITAL_COMMUNITY): Payer: Self-pay | Admitting: Cardiology

## 2021-03-28 DIAGNOSIS — Z950 Presence of cardiac pacemaker: Secondary | ICD-10-CM

## 2021-03-28 DIAGNOSIS — N183 Chronic kidney disease, stage 3 unspecified: Secondary | ICD-10-CM

## 2021-03-28 DIAGNOSIS — R5381 Other malaise: Secondary | ICD-10-CM

## 2021-03-28 DIAGNOSIS — R531 Weakness: Secondary | ICD-10-CM

## 2021-03-28 DIAGNOSIS — I4819 Other persistent atrial fibrillation: Secondary | ICD-10-CM

## 2021-03-28 LAB — BASIC METABOLIC PANEL
Anion gap: 5 (ref 5–15)
Anion gap: 10 (ref 5–15)
BUN: 84 mg/dL — ABNORMAL HIGH (ref 8–23)
BUN: 89 mg/dL — ABNORMAL HIGH (ref 8–23)
CO2: 23 mmol/L (ref 22–32)
CO2: 16 mmol/L — ABNORMAL LOW (ref 22–32)
Calcium: 8.2 mg/dL — ABNORMAL LOW (ref 8.9–10.3)
Calcium: 8.1 mg/dL — ABNORMAL LOW (ref 8.9–10.3)
Chloride: 104 mmol/L (ref 98–111)
Chloride: 105 mmol/L (ref 98–111)
Creatinine, Ser: 1.91 mg/dL — ABNORMAL HIGH (ref 0.61–1.24)
Creatinine, Ser: 1.93 mg/dL — ABNORMAL HIGH (ref 0.61–1.24)
GFR, Estimated: 38 mL/min — ABNORMAL LOW (ref >60)
GFR, Estimated: 38 mL/min — ABNORMAL LOW (ref >60)
Glucose, Bld: 263 mg/dL — ABNORMAL HIGH (ref 70–99)
Glucose, Bld: 198 mg/dL — ABNORMAL HIGH (ref 70–99)
Potassium: 5.6 mmol/L — ABNORMAL HIGH (ref 3.5–5.1)
Potassium: 5.9 mmol/L — ABNORMAL HIGH (ref 3.5–5.1)
Sodium: 132 mmol/L — ABNORMAL LOW (ref 135–145)
Sodium: 131 mmol/L — ABNORMAL LOW (ref 135–145)

## 2021-03-28 LAB — CBC
HCT: 32.9 % — ABNORMAL LOW (ref 39.0–52.0)
Hemoglobin: 10.6 g/dL — ABNORMAL LOW (ref 13.0–17.0)
MCH: 29.8 pg (ref 26.0–34.0)
MCHC: 32.2 g/dL (ref 30.0–36.0)
MCV: 92.4 fL (ref 80.0–100.0)
Platelets: 229 10*3/uL (ref 150–400)
RBC: 3.56 MIL/uL — ABNORMAL LOW (ref 4.22–5.81)
RDW: 17.2 % — ABNORMAL HIGH (ref 11.5–15.5)
WBC: 10 10*3/uL (ref 4.0–10.5)
nRBC: 0 % (ref 0.0–0.2)

## 2021-03-28 LAB — CULTURE, BLOOD (ROUTINE X 2)
Culture: NO GROWTH
Culture: NO GROWTH
Special Requests: ADEQUATE

## 2021-03-28 LAB — GLUCOSE, CAPILLARY
Glucose-Capillary: 264 mg/dL — ABNORMAL HIGH (ref 70–99)
Glucose-Capillary: 230 mg/dL — ABNORMAL HIGH (ref 70–99)
Glucose-Capillary: 140 mg/dL — ABNORMAL HIGH (ref 70–99)
Glucose-Capillary: 178 mg/dL — ABNORMAL HIGH (ref 70–99)

## 2021-03-28 LAB — HEPATIC FUNCTION PANEL
ALT: 26 U/L (ref 0–44)
AST: 18 U/L (ref 15–41)
Albumin: 2.2 g/dL — ABNORMAL LOW (ref 3.5–5.0)
Alkaline Phosphatase: 73 U/L (ref 38–126)
Bilirubin, Direct: <0.1 mg/dL (ref 0.0–0.2)
Total Bilirubin: 0.5 mg/dL (ref 0.3–1.2)
Total Protein: 5.9 g/dL — ABNORMAL LOW (ref 6.5–8.1)

## 2021-03-28 MED ORDER — AMIODARONE HCL 200 MG PO TABS
400.0000 mg | ORAL_TABLET | Freq: Two times a day (BID) | ORAL | Status: DC
  Administered 2021-03-28 – 2021-03-29 (×3): 400 mg via ORAL
  Filled 2021-03-28 (×4): qty 2

## 2021-03-28 MED ORDER — CARVEDILOL 6.25 MG PO TABS
6.2500 mg | ORAL_TABLET | Freq: Two times a day (BID) | ORAL | Status: DC
  Administered 2021-03-28 – 2021-03-29 (×2): 6.25 mg via ORAL
  Filled 2021-03-28 (×2): qty 1

## 2021-03-28 MED ORDER — SODIUM ZIRCONIUM CYCLOSILICATE 10 G PO PACK: 10.0000 g | PACK | Freq: Three times a day (TID) | ORAL | Status: DC

## 2021-03-28 MED ORDER — SODIUM ZIRCONIUM CYCLOSILICATE 10 G PO PACK
10.0000 g | PACK | Freq: Two times a day (BID) | ORAL | Status: AC
  Administered 2021-03-28 (×2): 10 g via ORAL
  Filled 2021-03-28 (×3): qty 1

## 2021-03-28 NOTE — Plan of Care (Signed)

## 2021-03-28 NOTE — Progress Notes (Signed)
Progress Note  Patient Name: [name] Date of Encounter: 03/28/2021  [facility] Cardiologist: Mertie Moores, MD    Patient Profile     65 y.o. male  with a hx of some degree of developmental delay, cared for by his sister, CAD (CABG 2017), chronic CHF (diastolic), PVD (s/p LCEA 0000000 > non-obstructive carotid disease since, follows with [facility]), h/o partial foot amputations 2/2 diabetic ulcer (2014),  COPD (home O2), prior stroke, DM, HTN, HLD, AFib, large abd hernia/mass   Had a syncopal event with EMS observed to have period of asystole and in the ER recurrent long/symptomatic pauses/asystole intermittent compressions > epi gtt > temp pacing wire  COVID + (+/-pneumonia)  Cardiology called and did not think he would be a good candidate for pacer given comorbidities, poor hygiene, skin wounds, and infection risk and concerns of post procedure ability to follow restrictions  Continued to have pauses and following loss of function of the temp wire underwent CRT-P on Friday (Echo EF 20%)    Subjective   Feels much better   Inpatient Medications    Scheduled Meds:  apixaban  5 mg Oral BID   vitamin C  500 mg Oral Daily   aspirin EC  81 mg Oral Daily   atorvastatin  80 mg Oral Daily   baricitinib  2 mg Oral Daily   Chlorhexidine Gluconate Cloth  6 each Topical Daily   Chlorhexidine Gluconate Cloth  6 each Topical Q0600   dexamethasone (DECADRON) injection  6 mg Intravenous Q24H   furosemide  80 mg Oral BID   guaiFENesin  1,200 mg Oral BID   insulin aspart  0-20 Units Subcutaneous TID WC   insulin glargine-yfgn  20 Units Subcutaneous Daily   mupirocin ointment  1 application Nasal BID   pantoprazole  40 mg Oral Daily   PARoxetine  40 mg Oral Daily   sodium chloride flush  3 mL Intravenous Q12H   umeclidinium bromide  1 puff Inhalation Daily   zinc sulfate  220 mg Oral Daily   Continuous Infusions:  amiodarone 30 mg/hr (03/28/21 1207)   PRN Meds: acetaminophen,  dextrose, docusate sodium, fentaNYL (SUBLIMAZE) injection, guaiFENesin-dextromethorphan, ondansetron (ZOFRAN) IV, polyethylene glycol   Vital Signs    Vitals:   03/28/21 0900 03/28/21 1000 03/28/21 1100 03/28/21 1200  BP: 117/82 125/89 (!) 116/93 (!) 128/103  Pulse: (!) 107 (!) 107 (!) 136 (!) 129  Resp: (!) 27 (!) 23 19 (!) 21  Temp:    98.6 F (37 C)  TempSrc:    Oral  SpO2: 97% 99% 99% 98%  Weight:      Height:        Intake/Output Summary (Last 24 hours) at 03/28/2021 1223 Last data filed at 03/28/2021 1207 Gross per 24 hour  Intake 645.56 ml  Output 1895 ml  Net -1249.44 ml    Last 3 Weights 03/28/2021 03/27/2021 03/25/2021  Weight (lbs) 190 lb 14.7 oz 189 lb 13.1 oz 189 lb 2.5 oz  Weight (kg) 86.6 kg 86.1 kg 85.8 kg      Telemetry    Afib RVRPersonally Reviewed  ECG    No new EKGs - Personally Reviewed  Physical Exam   Well developed and nourished in no acute distress HENT normal Neck supple  Carotids brisk and full without bruits Pocket without hematoma, swelling or tenderness  Clear Irregularly irregular rate and rhythm with rapid ventricular response, no murmurs or gallops Abd-soft with active BS without hepatomegaly No Clubbing cyanosis  edema Skin-warm and dry A & Oriented  Grossly normal sensory and motor function   Labs    High Sensitivity Troponin:   Recent Labs  Lab 03/23/21 1029 03/23/21 1451  TROPONINIHS 466* 701*       Chemistry Recent Labs  Lab 03/26/21 0024 03/27/21 0430 03/28/21 0617  NA 135 134* 132*  K 4.7 4.5 5.6*  CL 105 105 104  CO2 19* 21* 23  GLUCOSE 129* 106* 263*  BUN 81* 84* 84*  CREATININE 2.01* 1.77* 1.91*  CALCIUM 8.2* 8.4* 8.2*  PROT 5.9* 5.7* 5.9*  ALBUMIN 2.3* 2.2* 2.2*  AST '26 23 18  '$ ALT '23 21 26  '$ ALKPHOS 69 68 73  BILITOT 0.5 0.5 0.5  GFRNONAA 36* 42* 38*  ANIONGAP '11 8 5      '$ Hematology Recent Labs  Lab 03/25/21 0047 03/26/21 0024 03/28/21 0617  WBC 22.6* 12.6* 10.0  RBC 3.73* 3.36* 3.56*   HGB 11.0* 9.9* 10.6*  HCT 35.7* 31.8* 32.9*  MCV 95.7 94.6 92.4  MCH 29.5 29.5 29.8  MCHC 30.8 31.1 32.2  RDW 16.7* 16.5* 17.2*  PLT 217 184 229     BNPNo results for input(s): BNP, PROBNP in the last 168 hours.   DDimer No results for input(s): DDIMER in the last 168 hours.   Radiology    DG Chest 2 View Result Date: 03/22/2021 CLINICAL DATA:  Decreased breath sounds at right lung base. Chest pain with nonproductive cough for 2 weeks. EXAM: CHEST - 2 VIEW COMPARISON:  Radiograph 06/30/2020. FINDINGS: Similar low lung volumes. Patient is post median sternotomy. Similar cardiomegaly with aortic atherosclerosis and tortuosity there is patchy airspace disease at the right lung base, new from prior exam. Background peribronchial and interstitial thickening is similar. Minor subsegmental atelectasis at the left lung base. Suspected small right pleural effusion. No pneumothorax. Scoliotic curvature of the spine. IMPRESSION: 1. Patchy airspace disease at the right lung base, suspicious for pneumonia. Followup PA and lateral chest X-ray is recommended in 3-4 weeks following trial of antibiotic therapy to ensure resolution and exclude underlying malignancy. 2. Suspected small right pleural effusion. 3. Stable cardiomegaly. Stable mild diffuse interstitial and bronchial thickening suggesting chronic lung disease, possible COPD. Electronically Signed   By: Keith Rake M.D.   On: 03/22/2021 23:51   DG CHEST PORT 1 VIEW Result Date: 03/23/2021 CLINICAL DATA:  Septic shock bradycardia EXAM: PORTABLE CHEST 1 VIEW COMPARISON:  03/23/2021, 03/22/2021 FINDINGS: Post sternotomy changes. Cardiomegaly with small right-sided pleural effusion. Worsening airspace disease at the right base. Aortic atherosclerosis. No pneumothorax. IMPRESSION: 1. Cardiomegaly with small pleural effusion and worsening edema or pneumonia at the right lung base. Electronically Signed   By: Donavan Foil M.D.   On: 03/23/2021 22:17        Cardiac Studies    03/23/21 TTE IMPRESSIONS   1. Left ventricular ejection fraction, by estimation, is 25 to 30%. The  left ventricle has severely decreased function. The left ventricle  demonstrates global hypokinesis. The left ventricular internal cavity size  was moderately dilated.   2. Right ventricular systolic function was not well visualized. The right  ventricular size is not well visualized. There is moderately elevated  pulmonary artery systolic pressure.   3. Left atrial size was moderately dilated.   4. Right atrial size was moderately dilated.   5. The mitral valve is abnormal. Mild mitral valve regurgitation. No  evidence of mitral stenosis. Moderate mitral annular calcification.   6. The aortic valve was  not well visualized. Aortic valve regurgitation  is not visualized. Mild to moderate aortic valve sclerosis/calcification  is present, without any evidence of aortic stenosis.   7. The inferior vena cava is normal in size with greater than 50%  respiratory variability, suggesting right atrial pressure of 3 mmHg.    09/18/2017; TTE Study Conclusions  - Left ventricle: The cavity size was normal. Wall thickness was    increased in a pattern of mild LVH. Indeterminant diastolic    function, atrial fibrillation. Systolic function was mildly to    moderately reduced. The estimated ejection fraction was in the    range of 40% to 45%. Diffuse hypokinesis.  - Aortic valve: There was no stenosis.  - Mitral valve: Moderately calcified annulus. There was no    significant regurgitation.  - Left atrium: The atrium was mildly dilated.  - Right ventricle: The cavity size was mildly to moderately    dilated. Pacer wire or catheter noted in right ventricle.    Systolic function was mildly reduced.  - Right atrium: The atrium was mildly dilated.  - Tricuspid valve: Peak RV-RA gradient (S): 32 mm Hg.  - Pulmonary arteries: PA peak pressure: 35 mm Hg (S).  - Inferior  vena cava: The vessel was normal in size. The    respirophasic diameter changes were in the normal range (>= 50%),    consistent with normal central venous pressure.   Impressions:  - The patient was in atrial fibrillation. Normal LV size with mild    LV hypertrophy. EF 40-45%. Mild-moderate RV dilation with mildly    decreased systolic function. Borderline pulmonary hypertension.     Subsequently conversations with CCM and family planned for palliative eval, goals of care, and monitoring clinical progress   EP was asked to weigh in   pacing on telemetry On Epi gtt at 1 Levo at 8 Insulin gtt  Labs today K+ 3.5 BUN/Creat 70/2.69 Mag 2.6 Lactic acid 5.5 > 4.3 WBC 39.8 H/H 11/31 Plts 326   Assessment & Plan    Syncope  AFib persistent RVR with intermittent complete heart block   CRT-P   Cardiomyopathy ischemic prior CABG  COVID  pneumonia   Renal disease Gd 3b  Hyperkalemia  Will give Lokelma 10 mg twice.  Recheck.  With pacing, will begin carvedilol  this may help with rate control Renal issues make dig difficult  Continue amio, although change IV to PO  If BP allows, will anticipate hyd/isdn      Virl Axe

## 2021-03-28 NOTE — Progress Notes (Addendum)
Daily Progress Note   Patient Name: [name]       Date: 03/28/2021 DOB: 01/26/1956  Age: 65 y.o. MRN#: [id number] Attending Physician: Sid Falcon, MD Primary Care Physician: Janifer Adie, MD Admit Date: 03/23/2021  Reason for Consultation/Follow-up: Disposition and Establishing goals of care  Subjective: Chart review performed. Received report from primary RN - no acute concerns. Patient is s/p PPM placement 03/26/21. Patient has been weaned off pressors but still experiencing some arrhythmias and is receiving amiodarone. Overall patient seems to be showing improvement with PPM.   Went to visit patient at bedside - no family/visitors present. Patient was lying in bed awake, alert, oriented, and able to participate in conversation. No signs or non-verbal gestures of pain or discomfort noted. No respiratory distress, increased work of breathing, or secretions noted. Patient endorses left arm pain at times - states his current pain management regimen is sufficient. Denies shortness of breath. Patient is currently on 3L O2 [place]. Noted non-productive congested cough. Patient was drinking water and eating applesauce during my visit.   Reviewed interval history since last PMT visit. Patient states he is felling better after PPM placement, but states "I'm getting there." Reflected on his time with PT - he states it "went pretty good" and he "enjoyed being in the chair." We discussed their recommendation for rehab on discharge - Mr. Suppes is motivated and willing to continue PT through rehab - he requests [facility], but is open to other facilities. Encouraged active ROM exercises while patient is in the bed/chair.   Introduced, reviewed, and completed MOST form as outlined under  recommendation section below.   Education provided and offered outpatient Palliative Care to follow at discharge - patient agreeable.  Patient was appreciative of PMT visit today. All questions and concerns addressed. Encouraged to call with questions and/or concerns. PMT card previously provided.    Length of Stay: 5  Current Medications: Scheduled Meds:   amiodarone  400 mg Oral BID   apixaban  5 mg Oral BID   vitamin C  500 mg Oral Daily   aspirin EC  81 mg Oral Daily   atorvastatin  80 mg Oral Daily   baricitinib  2 mg Oral Daily   carvedilol  6.25 mg Oral BID WC   Chlorhexidine Gluconate Cloth  6  each Topical Daily   Chlorhexidine Gluconate Cloth  6 each Topical Q0600   dexamethasone (DECADRON) injection  6 mg Intravenous Q24H   furosemide  80 mg Oral BID   guaiFENesin  1,200 mg Oral BID   insulin aspart  0-20 Units Subcutaneous TID WC   insulin glargine-yfgn  20 Units Subcutaneous Daily   mupirocin ointment  1 application Nasal BID   pantoprazole  40 mg Oral Daily   PARoxetine  40 mg Oral Daily   sodium chloride flush  3 mL Intravenous Q12H   sodium zirconium cyclosilicate  10 g Oral BID   umeclidinium bromide  1 puff Inhalation Daily   zinc sulfate  220 mg Oral Daily    Continuous Infusions:   PRN Meds: acetaminophen, dextrose, docusate sodium, fentaNYL (SUBLIMAZE) injection, guaiFENesin-dextromethorphan, ondansetron (ZOFRAN) IV, polyethylene glycol  Physical Exam Vitals and nursing note reviewed.  Constitutional:      General: He is awake. He is not in acute distress.    Appearance: He is ill-appearing.  Pulmonary:     Effort: No respiratory distress.     Comments: Congested cough Skin:    General: Skin is warm and dry.     Findings: Ecchymosis present.  Neurological:     Mental Status: He is alert and oriented to person, place, and time.     Motor: Weakness present.  Psychiatric:        Attention and Perception: Attention normal.        Behavior:  Behavior is cooperative.        Cognition and Memory: Cognition and memory normal.            Vital Signs: BP (!) 128/103 (BP Location: Right Arm)   Pulse (!) 129   Temp 98.6 F (37 C) (Oral)   Resp (!) 21   Ht 5' (1.524 m)   Wt 86.6 kg   SpO2 98%   BMI 37.29 kg/m  SpO2: SpO2: 98 % O2 Device: O2 Device: Nasal Cannula O2 Flow Rate: O2 Flow Rate (L/min): 3 L/min  Intake/output summary:  Intake/Output Summary (Last 24 hours) at 03/28/2021 1348 Last data filed at 03/28/2021 1207 Gross per 24 hour  Intake 445.56 ml  Output 1895 ml  Net -1449.44 ml   LBM: Last BM Date: 03/27/21 Baseline Weight: Weight: 83.9 kg Most recent weight: Weight: 86.6 kg       Palliative Assessment/Data: PPS 50%      Patient Active Problem List   Diagnosis Date Noted   Chronic kidney disease    Cardiac asystole ([place]) 03/23/2021   History of cardioembolic cerebrovascular accident (CVA)    Fluid overload    Hyponatremia    Anemia of chronic disease    Pressure injury of skin 09/26/2017   AKI (acute kidney injury) (HCC)    Aspiration pneumonia of both lower lobes (HCC)    Influenza with respiratory manifestation    Tachy-brady syndrome (HCC)    Sinus pause    Intertrigo    Right bundle branch block (RBBB) on electrocardiography    Dysphagia    Septic shock ([place]) 02/13/2016   Ischemic cardiomyopathy 09/17/2015   CAD (coronary artery disease) 07/14/2015   Chronic combined systolic and diastolic CHF (congestive heart failure) ([place]) 07/14/2015   Abrasion of abdominal wall 10/26/2014   Acute respiratory failure with hypoxia ([place]) 10/26/2014   Gastritis and gastroduodenitis 06/10/2014   Benign neoplasm of ascending colon 06/10/2014   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 ([place]) 02/14/2013  Wound, open, foot 12/13/2012   IDA (iron deficiency anemia) 12/13/2012   Hyperkalemia 05/22/2012   Diabetic leg ulcer ([place]) 05/15/2012   Goals of care, counseling/discussion 05/15/2012    Leg weakness, bilateral 12/10/2011   Carotid artery disease ([place]) 08/31/2011   Sciatica 05/22/2011   Ventral hernia without obstruction or gangrene 01/24/2011   Incidental lung nodule, greater than or equal to 42m 11/24/2010   CVA (cerebral vascular accident) ([facility] 10/24/2010   PAF (paroxysmal atrial fibrillation) ([facility] 07/24/2009   Anxiety state 06/22/2007   DM (diabetes mellitus), type 2, uncontrolled ([place] 12/22/2006   Essential hypertension 12/22/2006   HYPERCHOLESTEROLEMIA 10/05/2006   TOBACCO DEPENDENCE 10/05/2006   Unspecified intellectual disabilities 10/05/2006   COPD (chronic obstructive pulmonary disease) ([place] 10/05/2006    Palliative Care Assessment & Plan   Patient Profile: 65y.o. male  with past medical history of  admitted on 03/23/2021 with  mild developmental delay, HTN, HLD, Afib with RVR, tachy-brady syndrome (not a permanent pacemaker candidate), chronic combined systolic/diastolic HF ([id number], CAD (MI 2016, s/p CABG 2017), CVA (2012), T2DM (c/b diabetic neuropathy, s/p R transmetatarsal amputation), CKD stage III.    [facility] in [facility]. In the ED, he has recurrent asystolic events with several rounds of CPR. He is not a candidate for a permanent pacemaker. Palliative medicine has been consulted to assist with goals of care conversation.  Assessment: Sick sinus syndrome with cardiac collapse and asystole Paroxysmal atrial fibrillation Chronic combined systolic and diastolic HF Acute hypoxic respiratory distress Fluid volume overload COPD CKD stage 3a COVID infection  Recommendations/Plan: Continue full scope medical treatment Continue full code status Patient is motivated and willing to continue rehabilitation at rehab facility when discharged if needed - requesting [facility], but open to other facilities MOST form completed as follows: CPR, Full Scope of Treatment (trial period of intubation if needed, no tracheostomy), Determine  use or limitations of antibiotics when infection occurs, IVF for a defined trail period, Feeding tube for a defined trial period. Original placed in shadow chart. Copy was made and will be scanned into Vynca/ACP tab Recommend [facility] Pall[facility] Care referral PMT will continue to follow peripherally. If there are any imminent needs please call the service directly   Goals of Care and Additional Recommendations: Limitations on Scope of Treatment: Full Scope Treatment and No Tracheostomy  Code Status:    Code Status Orders  (From admission, onward)           Start     Ordered   03/23/21 1307  Full code  Continuous        03/23/21 1309           Code Status History     Date Active Date Inactive Code Status Order ID Comments User Context   09/15/2017 1438 10/26/2017 1912 Full Code [id number] RCollier Salina MD ED   08/12/2016 0212 08/14/2016 1929 Full Code [id number] RShela Leff MD Inpatient   02/14/2016 0110 02/19/2016 1852 Full Code [id number] SNorval Morton MD ED   08/13/2015 1409 08/21/2015 1423 Full Code [id number] ZNani Skillern PA-C Inpatient   04/30/2015 1416 04/30/2015 1934 Full Code [id number] SBelva Crome MD Inpatient   10/26/2014 1809 11/03/2014 1553 Partial Code [id number] BCollene Gobble MD Inpatient   10/26/2014 0821 10/26/2014 1809 Full Code [id number] JTheressa Millard MD ED   04/23/2013 2049 04/25/2013 1814 Full Code [id number] DMeridee Score  V, MD Inpatient   03/29/2013 1419 03/30/2013 1655 Full Code [id number]  Newt Minion, MD Inpatient   11/09/2012 2309 11/11/2012 1953 Full Code [id number]  Ma Hillock, DO Inpatient       Prognosis:  Unable to determine  Discharge Planning: [place] for rehab with Palliative care service follow-up  Care plan was discussed with primary RN, patient  Thank you for allowing the Palliative Medicine Team to assist in the care of this  patient.   Total Time 40 minutes Prolonged Time Billed  no       Greater than 50%  of this time was spent counseling and coordinating care related to the above assessment and plan.  Lin Landsman, NP  Please contact Palliative Medicine Team phone at [phone] for questions and concerns.

## 2021-03-28 NOTE — Plan of Care (Signed)
  Problem: Health Behavior/Discharge Planning: Goal: Ability to manage health-related needs will improve Outcome: Progressing   Problem: Clinical Measurements: Goal: Ability to maintain clinical measurements within normal limits will improve Outcome: Progressing Goal: Diagnostic test results will improve Outcome: Progressing Goal: Respiratory complications will improve Outcome: Progressing Goal: Cardiovascular complication will be avoided Outcome: Progressing   

## 2021-03-28 NOTE — Progress Notes (Addendum)
**Note [name]**     Summary: [name] is a 65 y.o. male with a hx of paroxysmal atrial fibrillation on low-dose Eliquis, tachybradycardia syndrome, chronic systolic and diastolic heart failure, hypertension, hyperlipidemia, developmental delayed, COPD, bilateral carotid stenosis with left carotid enterectomy 2017, ischemic CVAs, type 2 diabetes, tobacco abuse, CKD stage III who presented with syncope and bradycardia.  Subjective: I seen and evaluated [name] at bedside. He states he is feeling fine. He report left arm pain. His left arm was  in a sling. He denies headache, chest pain, SOB, abd pain, or lower extremity pain.  Objective:  Vital signs in last 24 hours: Vitals:   03/28/21 0900 03/28/21 1000 03/28/21 1100 03/28/21 1200  BP: 117/82 125/89 (!) 116/93 (!) 128/103  Pulse: (!) 107 (!) 107 (!) 136 (!) 129  Resp: (!) 27 (!) 23 19 (!) 21  Temp:    98.6 F (37 C)  TempSrc:    Oral  SpO2: 97% 99% 99% 98%  Weight:      Height:       Physical Exam Constitutional:      Interventions: Nasal cannula in place.  HENT:     Head: Normocephalic and atraumatic.  Musculoskeletal:     Comments: Left arm in sling  Skin:    General: Skin is warm and dry.  Neurological:     General: No focal deficit present.     Mental Status: He is alert. Mental status is at baseline.  Psychiatric:        Speech: Speech normal.        Behavior: Behavior normal. Behavior is cooperative.     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  EMS was called, witnessed patient passing out again while walking  with his walker.  He was noted bradycardic and hypoxic, required brief chest compression with spontaneous recovery of consciousness.  Upon arrival to ED, he had required >10 times of CPR due to frequent unconsciousness/asystole episodes. Cardiology consulted. Temporary pacemaker as surgical intervention. Not a good candidate for permament pacemaker placement per cardiology. Patient has been HDS for the past 24 hours. Palliative care on board --IV amiodarone switched to Amiodarone '400mg'$  PO --Continue Eliquis '5mg'$  BID --Aspirin daily --Carvedilol 6.'5mg'$  BID --Lasix '80mg'$  BID  COVID Positve Patient tested positive for COVID on admission 03/23/21. He denied any symptoms at the time.  Patient denies cough or SOB. Patient is on air [place] and contact precaution. --Continue O2 supplementation as needed --Monitor for new or worsening symptoms  CKD III -At baseline, continue to monitor --Continue home medications   T2DM c/b diabetic neuropathy --SSI,moderate scale  HLD --Continue home medication; Atorvastatin '80mg'$  daily   Prior to Admission Living Arrangement: Anticipated Discharge Location: Barriers to Discharge: Dispo: Anticipated discharge in approximately 2-3 day(s).   Timothy Lasso, MD 03/28/2021, 1:32 PM Pager: [phone] After 5pm on weekdays and 1pm on weekends: On Call pager [phone]

## 2021-03-28 NOTE — Progress Notes (Signed)
Physical Therapy Evaluation Patient Details Name: [name] MRN: [id number] DOB: [birth year] Today's Date: 03/28/2021   History of Present Illness  This 65 y.o. male admitted after cardiac arrest with continued episodes of unresponsiveness/asystole requiring brief CPR vs stimulations.  He underwent placement of pacemaker on 8/19 due to heart block. Also found ot be COVID + PMH includes: developmental delay, benign abdominal tumor, tachy-brady syndrome, acute hypoxic respiratory failure, paroxysmal A_fib, sepsis, Community acquired PNA, DM, CKD stage IV, COPD, HTN, bil. carotid stenosis, h/o CVAs, s/p Rt transmet amputation  Clinical Impression  Pt admitted with above diagnosis.  Pt currently with functional limitations due to the deficits listed below (see PT Problem List). He required mod A +2 for squat pivot transfer to recliner.  PTA, he lived with with his sister, and was mod I - min A for ADLs and ambulated mod I with rollator.  [facility] days/[facility], and intermittent aid assist.  Pt will benefit from skilled PT in SNF to increase their independence and safety with mobility to allow discharge to the venue listed below.       Follow Up Recommendations SNF;Supervision/Assistance - 24 hour    Equipment Recommendations  None recommended by PT    Recommendations for Other Services       Precautions / Restrictions Precautions Precautions: Fall;ICD/Pacemaker; COVID Precaution Comments: Pt with Lt sling in place.  Pt instructed to avoid moving shoulder or pushing/pulling with Lt UE - he requires mod cues to comply Required Braces or Orthoses: Sling Restrictions Weight Bearing Restrictions: No      Mobility  Bed Mobility Overal bed mobility: Needs Assistance Bed Mobility: Supine to Sit     Supine to sit: Mod assist;+2 for physical assistance;+2 for safety/equipment;HOB elevated     General bed mobility comments: requires assist to move LEs off the bed and  to lift his trunk    Transfers Overall transfer level: Needs assistance Equipment used: 2 person hand held assist Transfers: Squat Pivot Transfers     Squat pivot transfers: Mod assist;+2 physical assistance;+2 safety/equipment     General transfer comment: Pt requires assist to boost hips and to maintain balance.  Mod cues to avoid use of Lt UE  Ambulation/Gait                Stairs            Wheelchair Mobility    Modified Rankin (Stroke Patients Only)       Balance Overall balance assessment: Needs assistance Sitting-balance support: Feet unsupported;Single extremity supported Sitting balance-Leahy Scale: Poor Sitting balance - Comments: requires UE support                                     Pertinent Vitals/Pain Pain Assessment: Faces Faces Pain Scale: Hurts even more Pain Location: chest and Lt  UE Pain Descriptors / Indicators: Aching Pain Intervention(s): Limited activity within patient's tolerance;Monitored during session;Repositioned    Home Living Family/patient expects to be discharged to:: Private residence Living Arrangements: Other relatives Available Help at Discharge: Family;Friend(s);Personal care attendant;Available PRN/intermittently Type of Home: House Home Access: Ramped entrance     Home Layout: One level Home Equipment: [place] - 4 wheels;Cane - quad;Shower seat;Wheelchair - manual Additional Comments: aid on M, F for 2-3 hours and sometimes on W.  He does not provide clear info on how long he is at home alone, but  it seems like he may be alone for several hours a day.  No family present to provide info    Prior Function Level of Independence: Needs assistance   Gait / Transfers Assistance Needed: Pt reports he ambulates wtih rollator most of the time, but uses w/c "when I need it".  He was unable to elaborate on when he needs to use it.  ADL's / Homemaking Assistance Needed: Pt reports he only showers when his  aid is present and he sometimes needs assist wtih socks and requires assist for IADLs  Comments: Pt attends [facility], and has an aid on M, F and sometimes on W.  aid     Hand Dominance   Dominant Hand: Right    Extremity/Trunk Assessment   Upper Extremity Assessment Upper Extremity Assessment: Defer to OT evaluation LUE Deficits / Details: Shoulder immobile due to pacemaker precautions    Lower Extremity Assessment Lower Extremity Assessment: Generalized weakness    Cervical / Trunk Assessment Cervical / Trunk Assessment: Kyphotic  Communication   Communication: No difficulties  Cognition Arousal/Alertness: Awake/alert Behavior During Therapy: WFL for tasks assessed/performed Overall Cognitive Status: No family/caregiver present to determine baseline cognitive functioning                                 General Comments: pt with h/o intellectual disability.  No family present to provide baseline details, but pt able to follow commands consistently      General Comments General comments (skin integrity, edema, etc.): HR to 135 with activity.  Sp02 >92% 3L    Exercises General Exercises - Lower Extremity Ankle Circles/Pumps: AROM;Both;10 reps;Supine   Assessment/Plan    PT Assessment Patient needs continued PT services  PT Problem List Decreased activity tolerance;Decreased balance;Decreased mobility;Decreased knowledge of use of DME;Decreased safety awareness;Decreased knowledge of precautions;Obesity       PT Treatment Interventions DME instruction;Gait training;Functional mobility training;Therapeutic activities;Therapeutic exercise;Balance training;Patient/family education    PT Goals (Current goals can be found in the Care Plan section)  Acute Rehab PT Goals Patient Stated Goal: to get out of the bed PT Goal Formulation: With patient Time For Goal Achievement: 04/11/21 Potential to Achieve Goals: Good    Frequency Min 3X/week   Barriers to  discharge Decreased caregiver support      Co-evaluation PT/OT/SLP Co-Evaluation/Treatment: Yes Reason for Co-Treatment: Complexity of the patient's impairments (multi-system involvement);For patient/therapist safety PT goals addressed during session: Mobility/safety with mobility         AM-PAC PT "6 Clicks" Mobility  Outcome Measure Help needed turning from your back to your side while in a flat bed without using bedrails?: A Little Help needed moving from lying on your back to sitting on the side of a flat bed without using bedrails?: Total Help needed moving to and from a bed to a chair (including a wheelchair)?: Total Help needed standing up from a chair using your arms (e.g., wheelchair or bedside chair)?: Total Help needed to walk in hospital room?: Total Help needed climbing 3-5 steps with a railing? : Total 6 Click Score: 8    End of Session Equipment Utilized During Treatment: Gait belt;Oxygen Activity Tolerance: Patient limited by fatigue Patient left: in chair;with call bell/phone within reach;with chair alarm set Nurse Communication: Mobility status PT Visit Diagnosis: Unsteadiness on feet (R26.81);Muscle weakness (generalized) (M62.81)    Time: [id number] PT Time Calculation (min) (ACUTE ONLY): 27 min   Charges:  PT Evaluation $PT Eval Moderate Complexity: 1 Mod          Baldomero Mirarchi M,PT Acute Rehab Services [phone] (pager)   Alvira Philips 03/28/2021, 12:29 PM

## 2021-03-28 NOTE — Evaluation (Signed)
Occupational Therapy Evaluation Patient Details Name: [name] MRN: [id number] DOB: 08-08-1956 Today's Date: 03/28/2021    History of Present Illness This 65 y.o. male admitted after cardiac arrest with continued episodes of unresponsiveness/asystole requiring brief CPR vs stimulations.  He underwent placement of pacemaker on 8/19 due to heart block. Also found ot be COVID + PMH includes: developmental delay, benign abdominal tumor, tachy-brady syndrome, acute hypoxic respiratory failure, paroxysmal A_fib, sepsis, Community acquired PNA, DM, CKD stage IV, COPD, HTN, bil. carotid stenosis, h/o CVAs, s/p Rt transmet amputation   Clinical Impression   Pt admitted with above. He demonstrates the below listed deficits and will benefit from continued OT to maximize safety and independence with BADLs.  Pt presents to OT with generalized weakness, impaired balance, increased pain, decreased activity tolerance.  He currently requires min - max A for UB ADLs and max A - Total A for LB ADLs.  He required mod A +2 for squat pivot transfer to recliner.  PTA, he lived with with his sister, and was mod I - min A for ADLs and ambulated mod I with rollator.  [facility] days/[facility], and intermittent aid assist.  Recommend SNF for rehab to maximize safety and independence with ADLs      Follow Up Recommendations  SNF;Supervision/Assistance - 24 hour    Equipment Recommendations  None recommended by OT    Recommendations for Other Services       Precautions / Restrictions Precautions Precautions: Fall;ICD/Pacemaker Precaution Comments: Pt with Lt sling in place.  Pt instructed to avoid moving shoulder or pushing/pulling with Lt UE - he requires mod cues to comply Required Braces or Orthoses: Sling Restrictions Weight Bearing Restrictions: Yes      Mobility Bed Mobility Overal bed mobility: Needs Assistance Bed Mobility: Supine to Sit     Supine to sit: Mod assist;+2 for  physical assistance;+2 for safety/equipment;HOB elevated     General bed mobility comments: requires assist to move LEs off the bed and to lift his trunk    Transfers Overall transfer level: Needs assistance Equipment used: 2 person hand held assist Transfers: Squat Pivot Transfers     Squat pivot transfers: Mod assist;+2 physical assistance;+2 safety/equipment     General transfer comment: Pt requires assist to boost hips and to maintain balance.  Mod cues to avoid use of Lt UE    Balance Overall balance assessment: Needs assistance Sitting-balance support: Feet unsupported;Single extremity supported Sitting balance-Leahy Scale: Poor Sitting balance - Comments: requires UE support                                   ADL either performed or assessed with clinical judgement   ADL Overall ADL's : Needs assistance/impaired Eating/Feeding: Set up;Bed level   Grooming: Wash/dry hands;Wash/dry face;Set up;Sitting   Upper Body Bathing: Moderate assistance;Bed level   Lower Body Bathing: Maximal assistance;Bed level   Upper Body Dressing : Maximal assistance;Sitting   Lower Body Dressing: Total assistance;Bed level   Toilet Transfer: Moderate assistance;+2 for physical assistance;+2 for safety/equipment;Squat-pivot;BSC   Toileting- Clothing Manipulation and Hygiene: Total assistance;Bed level       Functional mobility during ADLs: Moderate assistance;+2 for physical assistance;+2 for safety/equipment       Vision Baseline Vision/History: Wears glasses Wears Glasses: At all times Additional Comments: Pt reports his glasses are missing since the event - encouraged him to contact his sister to  see if she may have his glasses     Perception Perception Perception Tested?: Yes   Praxis Praxis Praxis tested?: Within functional limits    Pertinent Vitals/Pain Pain Assessment: Faces Faces Pain Scale: Hurts even more Pain Location: chest and Lt  UE Pain  Descriptors / Indicators: Aching Pain Intervention(s): Monitored during session;Repositioned     Hand Dominance Right   Extremity/Trunk Assessment Upper Extremity Assessment Upper Extremity Assessment: LUE deficits/detail;Generalized weakness LUE Deficits / Details: Shoulder immobile due to pacemaker precautions   Lower Extremity Assessment Lower Extremity Assessment: Defer to PT evaluation       Communication Communication Communication: No difficulties   Cognition Arousal/Alertness: Awake/alert Behavior During Therapy: WFL for tasks assessed/performed Overall Cognitive Status: No family/caregiver present to determine baseline cognitive functioning                                 General Comments: pt with h/o intellectual disability.  No family present to provide baseline details, but pt able to follow commands consistently   General Comments  HR to 135 with activity.  Sp02 >92% 3L    Exercises     Shoulder Instructions      Home Living Family/patient expects to be discharged to:: Private residence Living Arrangements: Other relatives Available Help at Discharge: Family;Friend(s);Personal care attendant;Available PRN/intermittently Type of Home: House Home Access: Ramped entrance     Home Layout: One level     Bathroom Shower/Tub: Tub/shower unit;Curtain   Biochemist, clinical: Standard     Home Equipment: Environmental consultant - 4 wheels;Cane - quad;Shower seat;Wheelchair - manual   Additional Comments: aid on M, F for 2-3 hours and sometimes on W.  He does not provide clear info on how long he is at home alone, but it seems like he may be alone for several hours a day.  No family present to provide info      Prior Functioning/Environment Level of Independence: Needs assistance  Gait / Transfers Assistance Needed: Pt reports he ambulates wtih rollator most of the time, but uses w/c "when I need it".  He was unable to elaborate on when he needs to use it. ADL's /  Homemaking Assistance Needed: Pt reports he only showers when his aid is present and he sometimes needs assist wtih socks and requires assist for IADLs   Comments: Pt attends [facility] T,[facility], and has an aid on M, F and sometimes on W.  aid        OT Problem List: Decreased strength;Decreased activity tolerance;Impaired balance (sitting and/or standing);Decreased cognition;Decreased safety awareness;Decreased knowledge of use of DME or AE;Cardiopulmonary status limiting activity;Impaired UE functional use;Obesity;Pain      OT Treatment/Interventions: Self-care/ADL training;Therapeutic exercise;Energy conservation;DME and/or AE instruction;Therapeutic activities;Cognitive remediation/compensation;Patient/family education;Balance training    OT Goals(Current goals can be found in the care plan section) Acute Rehab OT Goals Patient Stated Goal: to get out of the bed OT Goal Formulation: With patient Time For Goal Achievement: 04/11/21 Potential to Achieve Goals: Good ADL Goals Pt Will Perform Grooming: with set-up;sitting Pt Will Perform Upper Body Bathing: with supervision;with set-up;sitting Pt Will Perform Lower Body Bathing: with mod assist;sit to/from stand Pt Will Transfer to Toilet: with mod assist;ambulating;regular height toilet;bedside commode;grab bars Pt Will Perform Toileting - Clothing Manipulation and hygiene: with min assist;sit to/from stand  OT Frequency: Min 2X/week   Barriers to D/C:            Co-evaluation  AM-PAC OT "6 Clicks" Daily Activity     Outcome Measure Help from another person eating meals?: A Little Help from another person taking care of personal grooming?: A Little Help from another person toileting, which includes using toliet, bedpan, or urinal?: A Lot Help from another person bathing (including washing, rinsing, drying)?: A Lot Help from another person to put on and taking off regular upper body clothing?: A Lot Help from another  person to put on and taking off regular lower body clothing?: Total 6 Click Score: 13   End of Session Equipment Utilized During Treatment: Gait belt;Oxygen Nurse Communication: Mobility status  Activity Tolerance: Patient tolerated treatment well Patient left: in chair;with call bell/phone within reach;with chair alarm set  OT Visit Diagnosis: Unsteadiness on feet (R26.81);Cognitive communication deficit (R41.841);Pain Pain - Right/Left: Left Pain - part of body: Shoulder                Time: [id number] OT Time Calculation (min): 30 min Charges:  OT General Charges $OT Visit: 1 Visit OT Evaluation $OT Eval Moderate Complexity: 1 Mod  Nilsa Nutting., OTR/L Acute Rehabilitation Services Pager [phone] Office [phone]   Lucille Passy M 03/28/2021, 10:19 AM

## 2021-03-29 DIAGNOSIS — U071 COVID-19: Secondary | ICD-10-CM | POA: Diagnosis not present

## 2021-03-29 DIAGNOSIS — E1165 Type 2 diabetes mellitus with hyperglycemia: Secondary | ICD-10-CM

## 2021-03-29 DIAGNOSIS — I469 Cardiac arrest, cause unspecified: Secondary | ICD-10-CM | POA: Diagnosis not present

## 2021-03-29 LAB — BASIC METABOLIC PANEL
Anion gap: 10 (ref 5–15)
BUN: 85 mg/dL — ABNORMAL HIGH (ref 8–23)
CO2: 19 mmol/L — ABNORMAL LOW (ref 22–32)
Calcium: 8.1 mg/dL — ABNORMAL LOW (ref 8.9–10.3)
Chloride: 102 mmol/L (ref 98–111)
Creatinine, Ser: 1.96 mg/dL — ABNORMAL HIGH (ref 0.61–1.24)
GFR, Estimated: 37 mL/min — ABNORMAL LOW (ref >60)
Glucose, Bld: 202 mg/dL — ABNORMAL HIGH (ref 70–99)
Potassium: 5.1 mmol/L (ref 3.5–5.1)
Sodium: 131 mmol/L — ABNORMAL LOW (ref 135–145)

## 2021-03-29 LAB — GLUCOSE, CAPILLARY
Glucose-Capillary: 220 mg/dL — ABNORMAL HIGH (ref 70–99)
Glucose-Capillary: 152 mg/dL — ABNORMAL HIGH (ref 70–99)
Glucose-Capillary: 177 mg/dL — ABNORMAL HIGH (ref 70–99)

## 2021-03-29 LAB — MAGNESIUM: Magnesium: 2 mg/dL (ref 1.7–2.4)

## 2021-03-29 LAB — POTASSIUM: Potassium: 5.1 mmol/L (ref 3.5–5.1)

## 2021-03-29 MED ORDER — CARVEDILOL 12.5 MG PO TABS
12.5000 mg | ORAL_TABLET | Freq: Two times a day (BID) | ORAL | Status: DC
  Administered 2021-03-29: 12.5 mg via ORAL
  Filled 2021-03-29 (×2): qty 1

## 2021-03-29 MED ORDER — INSULIN GLARGINE-YFGN 100 UNIT/ML ~~LOC~~ SOLN
25.0000 [IU] | Freq: Every day | SUBCUTANEOUS | Status: DC
  Administered 2021-03-30 – 2021-03-31 (×2): 25 [IU] via SUBCUTANEOUS
  Filled 2021-03-29 (×3): qty 0.25

## 2021-03-29 MED FILL — Midazolam HCl Inj 5 MG/5ML (Base Equivalent): INTRAMUSCULAR | Qty: 5 | Status: AC

## 2021-03-29 MED FILL — Lidocaine HCl Local Inj 1%: INTRAMUSCULAR | Qty: 20 | Status: AC

## 2021-03-29 MED FILL — Lidocaine HCl Local Inj 1%: INTRAMUSCULAR | Qty: 60 | Status: AC

## 2021-03-29 NOTE — Progress Notes (Signed)
  Speech Language Pathology Treatment: Dysphagia  Patient Details Name: [name] MRN: [id number] DOB: Mar 09, 1956 Today's Date: 03/29/2021 Time: [id number] SLP Time Calculation (min) (ACUTE ONLY): 16 min  Assessment / Plan / Recommendation Clinical Impression  Pt presents with persisting dry cough outside of meals which today did not appear to be exacerbated while eating his dinner.  His diet was advanced over the weekend to regular solids/thin liquids. He self-fed his dinner meal with enthusiasm and no reports of difficulty. Only occasional cues needed to curb impulsivity and eat with some caution. BS are improved. Pt is being transferred out of the ICU today. Will f/u x one more to ensure safety with current diet. D/W RN.   HPI HPI: Pt is a 65 y.o. male who presented on 8/16 with report of episodes of syncope and unresponsiveness. In the ED, he has recurrent asystolic events with rounds of CPR. PMT 8/17: continue full scope with watchful waiting. CXR 8/17: Right lower lobe consolidation which is suspicious for infection or  aspiration. PMH: mild developmental delay, HTN, HLD, Afib with RVR, tachy-brady syndrome (not a permanent pacemaker candidate), chronic combined systolic/diastolic HF ([id number]), CAD (MI 2016, s/p CABG 2017), CVA (2012), T2DM (c/b diabetic neuropathy, s/p R transmetatarsal amputation), CKD stage III. MBS 10/13/17: laryngeal penetation limited to flash with thin and contacting cords with thin via straw and exited vestibule. Timing of swallow functional with mildly incomplete epiglottic closure. Dysphagia 3 and thin liquids recommended      SLP Plan  Continue with current plan of care       Recommendations  Diet recommendations: Regular;Thin liquid Liquids provided via: Cup;Straw Medication Administration: Whole meds with puree Supervision: Patient able to self feed Compensations: Slow rate;Small sips/bites Postural Changes and/or Swallow Maneuvers: Seated  upright 90 degrees                Oral Care Recommendations: Oral care BID;Patient independent with oral care Follow up Recommendations: None SLP Visit Diagnosis: Dysphagia, unspecified (R13.10) Plan: Continue with current plan of care       GO              [name], [place] Office number [phone] Pager [phone]   Assunta Curtis 03/29/2021, 4:55 PM

## 2021-03-29 NOTE — Plan of Care (Signed)

## 2021-03-29 NOTE — Progress Notes (Addendum)
Progress Note  Patient Name: [name] Date of Encounter: 03/29/2021  [facility] Cardiologist: Mertie Moores, MD   Subjective   Feeling better every day, c/w slight cough, no SOB, minimal, pacer site discomfort  Inpatient Medications    Scheduled Meds:  amiodarone  400 mg Oral BID   apixaban  5 mg Oral BID   vitamin C  500 mg Oral Daily   aspirin EC  81 mg Oral Daily   atorvastatin  80 mg Oral Daily   baricitinib  2 mg Oral Daily   carvedilol  6.25 mg Oral BID WC   Chlorhexidine Gluconate Cloth  6 each Topical Daily   Chlorhexidine Gluconate Cloth  6 each Topical Q0600   dexamethasone (DECADRON) injection  6 mg Intravenous Q24H   furosemide  80 mg Oral BID   guaiFENesin  1,200 mg Oral BID   insulin aspart  0-20 Units Subcutaneous TID WC   insulin glargine-yfgn  20 Units Subcutaneous Daily   mupirocin ointment  1 application Nasal BID   pantoprazole  40 mg Oral Daily   PARoxetine  40 mg Oral Daily   sodium chloride flush  3 mL Intravenous Q12H   umeclidinium bromide  1 puff Inhalation Daily   zinc sulfate  220 mg Oral Daily   Continuous Infusions:   PRN Meds: acetaminophen, dextrose, docusate sodium, fentaNYL (SUBLIMAZE) injection, guaiFENesin-dextromethorphan, ondansetron (ZOFRAN) IV, polyethylene glycol   Vital Signs    Vitals:   03/29/21 0500 03/29/21 0600 03/29/21 0700 03/29/21 0800  BP:  125/73 (!) 123/101 (!) 141/116  Pulse:  (!) 19 90 (!) 36  Resp:   (!) 23 (!) 29  Temp:    98.8 F (37.1 C)  TempSrc:    Oral  SpO2:   100% 99%  Weight: 86.3 kg     Height:        Intake/Output Summary (Last 24 hours) at 03/29/2021 0909 Last data filed at 03/29/2021 0657 Gross per 24 hour  Intake 794.63 ml  Output 2430 ml  Net -1635.37 ml   Last 3 Weights 03/29/2021 03/28/2021 03/27/2021  Weight (lbs) 190 lb 4.1 oz 190 lb 14.7 oz 189 lb 13.1 oz  Weight (kg) 86.3 kg 86.6 kg 86.1 kg      Telemetry    AFib 110's some 130's, though HR trend is improving -  Personally Reviewed  ECG    No new EKGs - Personally Reviewed  Physical Exam   General Appearance: In no apparent distress, laying in bed, older than his age, chronically ill appearing HEENT: Normocephalic, atraumatic.  Neck: Neck is short and thick, difficult assess JVD Cardiovascular: Irregularly irregular, no murmur Respiratory: soft scattered rhonchi bilaterally Gastrointestinal: Large abdominal mass noted to the right  Extremities: s/p right foot partial amputation, trace edema of BLE Musculoskeletal: Generalized muscular atrophy, ratial R foot amputations Skin: Numerous skin tears and bruise of abdominal and extremities   Neurologic:AAO to self, place and situation Psychiatric: very pleasant and cooperative  Pacer sit is stable, steri strips in place No hematoma, no bleeding  Labs    High Sensitivity Troponin:   Recent Labs  Lab 03/23/21 1029 03/23/21 1451  TROPONINIHS 466* 701*      Chemistry Recent Labs  Lab 03/26/21 0024 03/27/21 0430 03/28/21 0617 03/28/21 1432 03/29/21 0028  NA 135 134* 132* 131* 131*  K 4.7 4.5 5.6* 5.9* 5.1  5.1  CL 105 105 104 105 102  CO2 19* 21* 23 16* 19*  GLUCOSE 129* 106*  263* 198* 202*  BUN 81* 84* 84* 89* 85*  CREATININE 2.01* 1.77* 1.91* 1.93* 1.96*  CALCIUM 8.2* 8.4* 8.2* 8.1* 8.1*  PROT 5.9* 5.7* 5.9*  --   --   ALBUMIN 2.3* 2.2* 2.2*  --   --   AST '26 23 18  '$ --   --   ALT '23 21 26  '$ --   --   ALKPHOS 69 68 73  --   --   BILITOT 0.5 0.5 0.5  --   --   GFRNONAA 36* 42* 38* 38* 37*  ANIONGAP '11 8 5 10 10     '$ Hematology Recent Labs  Lab 03/25/21 0047 03/26/21 0024 03/28/21 0617  WBC 22.6* 12.6* 10.0  RBC 3.73* 3.36* 3.56*  HGB 11.0* 9.9* 10.6*  HCT 35.7* 31.8* 32.9*  MCV 95.7 94.6 92.4  MCH 29.5 29.5 29.8  MCHC 30.8 31.1 32.2  RDW 16.7* 16.5* 17.2*  PLT 217 184 229    BNPNo results for input(s): BNP, PROBNP in the last 168 hours.   DDimer No results for input(s): DDIMER in the last 168 hours.    Radiology    DG Chest 2 View Result Date: 03/22/2021 CLINICAL DATA:  Decreased breath sounds at right lung base. Chest pain with nonproductive cough for 2 weeks. EXAM: CHEST - 2 VIEW COMPARISON:  Radiograph 06/30/2020. FINDINGS: Similar low lung volumes. Patient is post median sternotomy. Similar cardiomegaly with aortic atherosclerosis and tortuosity there is patchy airspace disease at the right lung base, new from prior exam. Background peribronchial and interstitial thickening is similar. Minor subsegmental atelectasis at the left lung base. Suspected small right pleural effusion. No pneumothorax. Scoliotic curvature of the spine. IMPRESSION: 1. Patchy airspace disease at the right lung base, suspicious for pneumonia. Followup PA and lateral chest X-ray is recommended in 3-4 weeks following trial of antibiotic therapy to ensure resolution and exclude underlying malignancy. 2. Suspected small right pleural effusion. 3. Stable cardiomegaly. Stable mild diffuse interstitial and bronchial thickening suggesting chronic lung disease, possible COPD. Electronically Signed   By: Keith Rake M.D.   On: 03/22/2021 23:51   DG CHEST PORT 1 VIEW Result Date: 03/23/2021 CLINICAL DATA:  Septic shock bradycardia EXAM: PORTABLE CHEST 1 VIEW COMPARISON:  03/23/2021, 03/22/2021 FINDINGS: Post sternotomy changes. Cardiomegaly with small right-sided pleural effusion. Worsening airspace disease at the right base. Aortic atherosclerosis. No pneumothorax. IMPRESSION: 1. Cardiomegaly with small pleural effusion and worsening edema or pneumonia at the right lung base. Electronically Signed   By: Donavan Foil M.D.   On: 03/23/2021 22:17       Cardiac Studies    8/1/922: TTE (limited) IMPRESSIONS   1. Left ventricular ejection fraction, by estimation, is 20%. The left  ventricle demonstrates global hypokinesis. The left ventricular internal  cavity size was mildly dilated. Left ventricular diastolic parameters  are  indeterminate.   2. Right ventricular systolic function is moderately reduced. The right  ventricular size is mildly enlarged. Tricuspid regurgitation signal is  inadequate for assessing PA pressure.   3. Left atrial size was mildly dilated.   4. Doppler not assessed. No evidence of mitral stenosis. Moderate mitral  annular calcification.   5. Doppler not assessed. The aortic valve is tricuspid.   6. The inferior vena cava is normal in size with <50% respiratory  variability, suggesting right atrial pressure of 8 mmHg.   7. Limited echo. The patient was tachycardic in atrial fibrillation, rate  in 130s generally.   03/23/21 TTE IMPRESSIONS  1. Left ventricular ejection fraction, by estimation, is 25 to 30%. The  left ventricle has severely decreased function. The left ventricle  demonstrates global hypokinesis. The left ventricular internal cavity size  was moderately dilated.   2. Right ventricular systolic function was not well visualized. The right  ventricular size is not well visualized. There is moderately elevated  pulmonary artery systolic pressure.   3. Left atrial size was moderately dilated.   4. Right atrial size was moderately dilated.   5. The mitral valve is abnormal. Mild mitral valve regurgitation. No  evidence of mitral stenosis. Moderate mitral annular calcification.   6. The aortic valve was not well visualized. Aortic valve regurgitation  is not visualized. Mild to moderate aortic valve sclerosis/calcification  is present, without any evidence of aortic stenosis.   7. The inferior vena cava is normal in size with greater than 50%  respiratory variability, suggesting right atrial pressure of 3 mmHg.    09/18/2017; TTE Study Conclusions  - Left ventricle: The cavity size was normal. Wall thickness was    increased in a pattern of mild LVH. Indeterminant diastolic    function, atrial fibrillation. Systolic function was mildly to    moderately reduced. The  estimated ejection fraction was in the    range of 40% to 45%. Diffuse hypokinesis.  - Aortic valve: There was no stenosis.  - Mitral valve: Moderately calcified annulus. There was no    significant regurgitation.  - Left atrium: The atrium was mildly dilated.  - Right ventricle: The cavity size was mildly to moderately    dilated. Pacer wire or catheter noted in right ventricle.    Systolic function was mildly reduced.  - Right atrium: The atrium was mildly dilated.  - Tricuspid valve: Peak RV-RA gradient (S): 32 mm Hg.  - Pulmonary arteries: PA peak pressure: 35 mm Hg (S).  - Inferior vena cava: The vessel was normal in size. The    respirophasic diameter changes were in the normal range (>= 50%),    consistent with normal central venous pressure.   Impressions:  - The patient was in atrial fibrillation. Normal LV size with mild    LV hypertrophy. EF 40-45%. Mild-moderate RV dilation with mildly    decreased systolic function. Borderline pulmonary hypertension.    Patient Profile     65 y.o. male  with a hx of some degree of developmental delay, cared for by his sister, CAD (CABG 2017), chronic CHF (diastolic), PVD (s/p LCEA 0000000 > non-obstructive carotid disease since, follows with [facility]), h/o partial foot amputations 2/2 diabetic ulcer (2014),  COPD (home O2), prior stroke, DM, HTN, HLD, AFib, large abd hernia/mass   Had a syncopal event with EMS observed to have period of asystole and in the ER recurrent long/symptomatic pauses/asystole intermittent compressions > epi gtt > temp pacing wire  COVID + (+/-pneumonia)  Cardiology called and did not think he would be a good candidate for pacer given comorbidities, poor hygiene, skin wounds, and infection risk and concerns of post procedure ability to follow restrictions  Subsequently conversations with CCM and family planned for palliative eval, goals of care, and monitoring clinical progress  EP was asked to weigh  in     Assessment & Plan    Syncope CHB, recurrent pauses/asystolic events on telemetry > temp pacing wire Temp wire became unstable/intermittent LOC [facility] S/p CRT-P implant Friday 8/19 w/Dr. Curt Bears LV lead currently programmed OFF  site is stable POD #1 CXR without ptx POD#1  pacer check with intact function I have d/w the patient wound care and activity restrictions Routine post pacing follow up is in place   2. PAFib CHA2DS2Vasc is 7, out pt on his eliquis 2.'5mg'$  BID (inappropriately dosed) >> heparin here >> Eliquis '5mg'$  BID  Rates are improving follow with addition of coreg will titrate dose Amiodarone historically, stopped about a year ago I find a note with some concern perhaps of an abnormal CXR and concerns of amio toxicity ? D/w Dr. Lovena Le, will stop amiodarone, plan rate control strategy   3. COVID  +/- pneumonia (03/23/21) Still on isolation Respiratory failure with improving O2 requirements Afebrile WBC steadily improving 10.0 yesterday O2 requirments are improved  >> OFF O2 Denies SOB Minimal cough C/w CCM/pulmonary   4. New CM 5. VT required external CV Fridap (pre-pacer) This was suspect to have been provoked by temp wire given he had not had any VT/V arrhythmias prior to the wire becoming unstable Repeat echo with persistent LVEF 20%, Dr. Curt Bears made aware prior to implant RBBB at baseline  D/w Dr. Irish Lack, wound not pursue ischemic evaluation at this juncture, and agreed, PPM was most reasonable plan CRT-P implanted Friday Getting lasix Fluid neg the last few days Coreg started yesterday, will titrate today for HR control Plan to add ARB/entresto perhaps tomorrow pending BP on hight BB dose   For questions or updates, please contact [facility] [place] Please consult [email or web address] for contact info under        Signed, Baldwin Jamaica, PA-C  03/29/2021, 9:09 AM    EP Attending  Patient seen and examined. Discussed with Dr. Curt Bears. He is s/p  biv PPM insertion. His device is stable. He has redeveloped atrial fib and his rates are not yet well controlled. He will be observed in the ICU and undergo uptitration of his AV nodal blocking drugs.   Carleene Overlie Kama Cammarano,MD

## 2021-03-29 NOTE — Progress Notes (Addendum)
Subjective: I seen and evaluated [name] at bedside. He states that he is feeling much better. He reports a dry cough, but denies headahce, fever, chills, chest pain, abd pain, lower extremity pain or edema.  He denies shortness of breath and is currently satting on room air.  [name] reports some discomfort in the left arm. He is still on airborne and contact precautions.  Objective:  Vital signs in last 24 hours: Vitals:   03/29/21 0500 03/29/21 0600 03/29/21 0700 03/29/21 0800  BP:  125/73 (!) 123/101 (!) 141/116  Pulse:  (!) 19 90 (!) 36  Resp:   (!) 23 (!) 29  Temp:    98.8 F (37.1 C)  TempSrc:    Oral  SpO2:   100% 99%  Weight: 86.3 kg     Height:       Physical Exam Constitutional:      General: He is awake.  HENT:     Head: Normocephalic and atraumatic.  Cardiovascular:     Rate and Rhythm: Normal rate.     Heart sounds: Normal heart sounds.  Pulmonary:     Effort: Pulmonary effort is normal.     Breath sounds: Normal breath sounds.     Comments: Dry coughing throughout interview Chest:     Comments: ICD placement on left upper chest, with bandage covering site. Abdominal:     General: Abdomen is protuberant. Bowel sounds are normal.     Palpations: Abdomen is soft.     Tenderness: There is no abdominal tenderness.  Musculoskeletal:     Right lower leg: No edema.     Left lower leg: No edema.  Skin:    General: Skin is warm and dry.     Comments: Redness and bruising around ICD placement Upper left chest  Neurological:     General: No focal deficit present.     Mental Status: He is alert. Mental status is at baseline.  Psychiatric:        Mood and Affect: Mood normal.        Behavior: Behavior normal. Behavior is cooperative.     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  Patient complained of some left arm discomfort. Palliative care on board --Tylenol 325-'650mg'$  Q4PRN for pain -- Amiodarone '400mg'$  PO --Continue Eliquis '5mg'$  BID --Aspirin daily --Carvedilol 6.'5mg'$  BID --Lasix '80mg'$  BID --Cardiology recommendation appreciated  [name] Patient tested positive for COVID on admission 03/23/21. He denied any symptoms at the time.  Patient is on air bourne and contact precaution. Patient is currently sating on RA. Denies SOB, patient reports a dry cough. --Monitor for new or worsening symptoms --Dexamethasone '6mg'$  once daily; started 03/23/21- End 04/01/21   CKD IIIb -At baseline, continue to monitor --Continue home medications    T2DM c/b diabetic neuropathy --SSI,Resistant scale --LANUS increased to 25units daily; due to increase in CBG ranging in the 200s   HLD --Continue home medication; Atorvastatin '80mg'$  daily  Prior to Admission Living Arrangement: Anticipated Discharge Location: Barriers to Discharge: Dispo: Anticipated discharge in approximately 1-2 day(s).   Timothy Lasso, MD 03/29/2021, 9:51 AM Pager: [phone] After 5pm on weekdays and 1pm on  weekends: On Call pager [phone]

## 2021-03-30 ENCOUNTER — Other Ambulatory Visit (HOSPITAL_COMMUNITY): Payer: Self-pay

## 2021-03-30 DIAGNOSIS — I469 Cardiac arrest, cause unspecified: Secondary | ICD-10-CM | POA: Diagnosis not present

## 2021-03-30 DIAGNOSIS — E1165 Type 2 diabetes mellitus with hyperglycemia: Secondary | ICD-10-CM | POA: Diagnosis not present

## 2021-03-30 DIAGNOSIS — U071 COVID-19: Secondary | ICD-10-CM | POA: Diagnosis not present

## 2021-03-30 LAB — CBC
HCT: 33.7 % — ABNORMAL LOW (ref 39.0–52.0)
Hemoglobin: 10.8 g/dL — ABNORMAL LOW (ref 13.0–17.0)
MCH: 29.8 pg (ref 26.0–34.0)
MCHC: 32 g/dL (ref 30.0–36.0)
MCV: 93.1 fL (ref 80.0–100.0)
Platelets: 213 10*3/uL (ref 150–400)
RBC: 3.62 MIL/uL — ABNORMAL LOW (ref 4.22–5.81)
RDW: 17.3 % — ABNORMAL HIGH (ref 11.5–15.5)
WBC: 8.7 10*3/uL (ref 4.0–10.5)
nRBC: 0 % (ref 0.0–0.2)

## 2021-03-30 LAB — GLUCOSE, CAPILLARY
Glucose-Capillary: 208 mg/dL — ABNORMAL HIGH (ref 70–99)
Glucose-Capillary: 170 mg/dL — ABNORMAL HIGH (ref 70–99)
Glucose-Capillary: 198 mg/dL — ABNORMAL HIGH (ref 70–99)
Glucose-Capillary: 119 mg/dL — ABNORMAL HIGH (ref 70–99)
Glucose-Capillary: 193 mg/dL — ABNORMAL HIGH (ref 70–99)

## 2021-03-30 LAB — BASIC METABOLIC PANEL
Anion gap: 8 (ref 5–15)
BUN: 85 mg/dL — ABNORMAL HIGH (ref 8–23)
CO2: 22 mmol/L (ref 22–32)
Calcium: 8.2 mg/dL — ABNORMAL LOW (ref 8.9–10.3)
Chloride: 100 mmol/L (ref 98–111)
Creatinine, Ser: 1.92 mg/dL — ABNORMAL HIGH (ref 0.61–1.24)
GFR, Estimated: 38 mL/min — ABNORMAL LOW (ref >60)
Glucose, Bld: 225 mg/dL — ABNORMAL HIGH (ref 70–99)
Potassium: 4.7 mmol/L (ref 3.5–5.1)
Sodium: 130 mmol/L — ABNORMAL LOW (ref 135–145)

## 2021-03-30 LAB — MAGNESIUM: Magnesium: 1.9 mg/dL (ref 1.7–2.4)

## 2021-03-30 MED ORDER — CARVEDILOL 25 MG PO TABS
25.0000 mg | ORAL_TABLET | Freq: Two times a day (BID) | ORAL | Status: DC
  Administered 2021-03-30 – 2021-04-01 (×5): 25 mg via ORAL
  Filled 2021-03-30 (×5): qty 1

## 2021-03-30 MED ORDER — MAGNESIUM SULFATE 2 GM/50ML IV SOLN
2.0000 g | Freq: Once | INTRAVENOUS | Status: AC
  Administered 2021-03-30: 2 g via INTRAVENOUS
  Filled 2021-03-30: qty 50

## 2021-03-30 MED ORDER — NYSTATIN 100000 UNIT/GM EX POWD
Freq: Three times a day (TID) | CUTANEOUS | Status: DC
  Filled 2021-03-30: qty 15

## 2021-03-30 NOTE — Progress Notes (Addendum)
This RN found bottle of opened fentanyl on floor in pts room next to trashcan and sharps box. 1 ml of fentanyl in bottle, wasted with Charge RN Warden Fillers in stericyle in med room. Pharmacy also made aware.

## 2021-03-30 NOTE — Progress Notes (Signed)
Occupational Therapy Treatment Patient Details Name: [name] MRN: [id number] DOB: November 29, 1955 Today's Date: 03/30/2021    History of present illness This 65 y.o. male admitted 8/16 after OOH cardiac arrest with continued episodes of unresponsiveness/asystole requiring brief CPR vs stimulations. PPM 8/19 due to heart block. covid (+). PMH includes: developmental delay, benign abdominal tumor, tachy-brady syndrome, acute hypoxic respiratory failure, paroxysmal A_fib, sepsis, CAP, DM, CKD stage IV, COPD, HTN, bil. carotid stenosis, h/o CVAs, s/p Rt transmet amputation   OT comments  Patient supine in bed and agreeable to OT/PT session. Completing transfers and functional mobility in room with min assist +2 for safety, able to recall L UE PPM precautions but requires cueing for adherence functionally. He fatigues easily and requires increased rest breaks.  Continue to recommend SNF at this time. VSS on RA.    Follow Up Recommendations  SNF;Supervision/Assistance - 24 hour    Equipment Recommendations  None recommended by OT    Recommendations for Other Services      Precautions / Restrictions Precautions Precautions: Fall;ICD/Pacemaker Precaution Comments: Large R hernia, Covid Restrictions Weight Bearing Restrictions: No       Mobility Bed Mobility Overal bed mobility: Needs Assistance Bed Mobility: Supine to Sit     Supine to sit: +2 for physical assistance;Min assist;HOB elevated     General bed mobility comments: HOB to 45degrees. Min A to move LEs to EOB. Pt used bedrails to elevate trunk to seated position. Assist of pelvis to pivot to EOB. Multimodal cues provided for pacemaker precautions and hand placement.    Transfers Overall transfer level: Needs assistance Equipment used: Rolling walker (2 wheeled) Transfers: Sit to/from [facility] Sit to Stand: +2 physical assistance;Min assist Stand pivot transfers: Min assist;+2 physical assistance        General transfer comment: Sit to stand x5. Min A +2 to power up into standing position. Verbal cues provided for upright posture and hand placement. Assist for anterior translation and rise    Balance Overall balance assessment: Needs assistance Sitting-balance support: Feet supported;Bilateral upper extremity supported Sitting balance-Leahy Scale: Fair Sitting balance - Comments: statically min guard for safety, cueing for precautions   Standing balance support: Bilateral upper extremity supported;During functional activity Standing balance-Leahy Scale: Poor Standing balance comment: Requires BUE on RW                           ADL either performed or assessed with clinical judgement   ADL Overall ADL's : Needs assistance/impaired     Grooming: Min guard;Oral care;Standing           Upper Body Dressing : Moderate assistance;Sitting   Lower Body Dressing: Total assistance;Sit to/from stand   Toilet Transfer: Minimal assistance;+2 for safety/equipment           Functional mobility during ADLs: Minimal assistance;+2 for safety/equipment;Cueing for safety;Rolling walker       Vision       Perception     Praxis      Cognition Arousal/Alertness: Awake/alert Behavior During Therapy: WFL for tasks assessed/performed Overall Cognitive Status: Impaired/Different from baseline Area of Impairment: Memory                     Memory: Decreased recall of precautions         General Comments: hx of intellecutal disability, follows commands but requires cueing to adhere to PPM precautions        Exercises  Shoulder Instructions       General Comments HR 97-120, spO2 89-99% on RA    Pertinent Vitals/ Pain       Pain Assessment: Faces Pain Score: 3  Faces Pain Scale: Hurts even more Pain Location: RLE and LLE wounds Pain Descriptors / Indicators: Sore Pain Intervention(s): Limited activity within patient's tolerance;Monitored  during session;Repositioned  Home Living                                          Prior Functioning/Environment              Frequency  Min 2X/week        Progress Toward Goals  OT Goals(current goals can now be found in the care plan section)  Progress towards OT goals: Progressing toward goals  Acute Rehab OT Goals Patient Stated Goal: get better OT Goal Formulation: With patient  Plan Discharge plan remains appropriate;Frequency remains appropriate    Co-evaluation    PT/OT/SLP Co-Evaluation/Treatment: Yes Reason for Co-Treatment: Complexity of the patient's impairments (multi-system involvement) PT goals addressed during session: Mobility/safety with mobility OT goals addressed during session: ADL's and self-care      AM-PAC OT "6 Clicks" Daily Activity     Outcome Measure   Help from another person eating meals?: A Little Help from another person taking care of personal grooming?: A Little Help from another person toileting, which includes using toliet, bedpan, or urinal?: A Lot Help from another person bathing (including washing, rinsing, drying)?: A Lot Help from another person to put on and taking off regular upper body clothing?: A Little Help from another person to put on and taking off regular lower body clothing?: A Lot 6 Click Score: 15    End of Session Equipment Utilized During Treatment: Gait belt;Rolling walker  OT Visit Diagnosis: Unsteadiness on feet (R26.81);Cognitive communication deficit (R41.841);Pain Pain - Right/Left:  (bil) Pain - part of body: Leg   Activity Tolerance Patient tolerated treatment well   Patient Left in chair;with call bell/phone within reach;with chair alarm set;with family/visitor present   Nurse Communication Mobility status        Time: 1100-1133 OT Time Calculation (min): 33 min  Charges: OT Treatments $Self Care/Home Management : 8-22 mins  Jolaine Artist, OT Acute Rehabilitation  Services Pager [phone] Office [phone]    [name] 03/30/2021, 1:24 PM

## 2021-03-30 NOTE — TOC Initial Note (Signed)
Transition of Care [facility]) - Initial/Assessment Note    Patient Details  Name: [name] MRN: [id number] Date of Birth: 1956-07-09  [facility]) CM/SW Contact:    Trula Ore, [place] Phone Number: 03/30/2021, 5:08 PM  Clinical Narrative:                  CSW received consult for possible SNF placement at time of discharge. CSW spoke with patient regarding PT recommendation of SNF placement at time of discharge. Patient comes from home with sister. Patient expressed understanding of PT recommendation and is agreeable to SNF placement at time of discharge. Patient reports preference for [facility] . Patient gave CSW permission to fax out initial referral near [place] area. Patient is with [facility]. Contact for [facility] is [employer]. Patient has received the COVID vaccines. Patient gave CSW permission to discuss [name]. CSW left voicemail for [place]. CSW awaiting callback. No further questions reported at this time. CSW to continue to follow and assist with discharge planning needs.   Expected Discharge Plan: Skilled Nursing Facility Barriers to Discharge: Continued Medical Work up   Patient Goals and CMS Choice Patient states their goals for this hospitalization and ongoing recovery are:: to go to SNF CMS Medicare.gov Compare Post Acute Care list provided to:: Patient Choice offered to / list presented to : Patient  Expected Discharge Plan and Services Expected Discharge Plan: [place] In-house Referral: Clinical Social Work     Living arrangements for the past 2 months: [place]                                      Prior Living Arrangements/Services Living arrangements for the past 2 months: Single Family Home Lives with:: Self, Siblings (Lives with sister [name]) Patient language and need for interpreter reviewed:: Yes Do you feel safe going back to the place where you live?: No   SNF   Need for Family Participation in Patient Care: Yes (Comment) Care giver support system in place?: Yes (comment)   Criminal Activity/Legal Involvement Pertinent to Current Situation/Hospitalization: No - Comment as needed  Activities of Daily Living Home Assistive Devices/Equipment: None ADL Screening (condition at time of admission) Patient's cognitive ability adequate to safely complete daily activities?: Yes Is the patient deaf or have difficulty hearing?: Yes Does the patient have difficulty seeing, even when wearing glasses/contacts?: Yes Does the patient have difficulty concentrating, remembering, or making decisions?: Yes Patient able to express need for assistance with ADLs?: Yes Does the patient have difficulty dressing or bathing?: Yes Independently performs ADLs?: Yes (appropriate for developmental age) Does the patient have difficulty walking or climbing stairs?: Yes Weakness of Legs: Right Weakness of Arms/Hands: Both  Permission Sought/Granted Permission sought to share information with : Case Manager, Family Supports, Chartered certified accountant granted to share information with : Yes, Verbal Permission Granted  Share Information with NAME: [name]  Permission granted to share info w AGENCY: SNF  Permission granted to share info w Relationship: sister  Permission granted to share info w Contact Information: [name] [phone]  Emotional Assessment Appearance:: Appears stated age Attitude/Demeanor/Rapport: Gracious Affect (typically observed): Calm Orientation: : Oriented to Self, Oriented to Place, Oriented to  Time, Oriented to Situation Alcohol / Substance Use: Not Applicable    Admission diagnosis:  Asystole ([place]) [I46.9] Cardiac asystole ([place]) [I46.9] Hyperglycemia [  R73.9] Chronic kidney disease, unspecified CKD stage [N18.9] Patient Active Problem List   Diagnosis Date Noted   Chronic kidney disease    Cardiac asystole ([place]) 03/23/2021    History of cardioembolic cerebrovascular accident (CVA)    Fluid overload    Hyponatremia    Anemia of chronic disease    Pressure injury of skin 09/26/2017   AKI (acute kidney injury) ([place])    Aspiration pneumonia of both lower lobes ([place])    Influenza with respiratory manifestation    Tachy-brady syndrome (HCC)    Sinus pause    Intertrigo    Right bundle branch block (RBBB) on electrocardiography    Dysphagia    Septic shock ([place]) 02/13/2016   Ischemic cardiomyopathy 09/17/2015   CAD (coronary artery disease) 07/14/2015   Chronic combined systolic and diastolic CHF (congestive heart failure) ([place]) 07/14/2015   Abrasion of abdominal wall 10/26/2014   Acute respiratory failure with hypoxia ([place]) 10/26/2014   Gastritis and gastroduodenitis 06/10/2014   Benign neoplasm of ascending colon 06/10/2014   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 ([place]) 02/14/2013   Wound, open, foot 12/13/2012   IDA (iron deficiency anemia) 12/13/2012   Hyperkalemia 05/22/2012   Diabetic leg ulcer ([place]) 05/15/2012   Goals of care, counseling/discussion 05/15/2012   Leg weakness, bilateral 12/10/2011   Carotid artery disease ([place]) 08/31/2011   Sciatica 05/22/2011   Ventral hernia without obstruction or gangrene 01/24/2011   Incidental lung nodule, greater than or equal to 28m 11/24/2010   CVA (cerebral vascular accident) ([place] 10/24/2010   PAF (paroxysmal atrial fibrillation) ([place] 07/24/2009   Anxiety state 06/22/2007   DM (diabetes mellitus), type 2, uncontrolled ([place] 12/22/2006   Essential hypertension 12/22/2006   HYPERCHOLESTEROLEMIA 10/05/2006   TOBACCO DEPENDENCE 10/05/2006   Unspecified intellectual disabilities 10/05/2006   COPD (chronic obstructive pulmonary disease) ([place] 10/05/2006   PCP:  KJanifer Adie MD [facility]   [facility] [id number] [address]Phone: [phone]:  [phone] [id number] [place] [id number] [id number]: [phone]: [phone]   Social Determinants of Health (SDOH) Interventions    Readmission Risk Interventions No flowsheet data found.

## 2021-03-30 NOTE — Progress Notes (Addendum)
Progress Note  Patient Name: [name] Date of Encounter: 03/30/2021  [facility] Cardiologist: Mertie Moores, MD   Subjective   Feeling better every day, c/w slight cough, no SOB,  denies pacer site discomfort  Inpatient Medications    Scheduled Meds:  apixaban  5 mg Oral BID   vitamin C  500 mg Oral Daily   aspirin EC  81 mg Oral Daily   atorvastatin  80 mg Oral Daily   carvedilol  25 mg Oral BID WC   Chlorhexidine Gluconate Cloth  6 each Topical Daily   dexamethasone (DECADRON) injection  6 mg Intravenous Q24H   furosemide  80 mg Oral BID   guaiFENesin  1,200 mg Oral BID   insulin aspart  0-20 Units Subcutaneous TID WC   insulin glargine-yfgn  25 Units Subcutaneous Daily   mupirocin ointment  1 application Nasal BID   pantoprazole  40 mg Oral Daily   PARoxetine  40 mg Oral Daily   sodium chloride flush  3 mL Intravenous Q12H   umeclidinium bromide  1 puff Inhalation Daily   zinc sulfate  220 mg Oral Daily   Continuous Infusions:   PRN Meds: acetaminophen, dextrose, docusate sodium, guaiFENesin-dextromethorphan, ondansetron (ZOFRAN) IV, polyethylene glycol   Vital Signs    Vitals:   03/30/21 0200 03/30/21 0300 03/30/21 0400 03/30/21 0500  BP: (!) 111/94 135/79 111/71 133/76  Pulse: 72 89 86 83  Resp: '15 18 16 15  '$ Temp:   97.7 F (36.5 C)   TempSrc:   Oral   SpO2: 99% 92% 96% 99%  Weight:    86.4 kg  Height:        Intake/Output Summary (Last 24 hours) at 03/30/2021 0814 Last data filed at 03/30/2021 0700 Gross per 24 hour  Intake 873.54 ml  Output 2900 ml  Net -2026.46 ml   Last 3 Weights 03/30/2021 03/29/2021 03/28/2021  Weight (lbs) 190 lb 7.6 oz 190 lb 4.1 oz 190 lb 14.7 oz  Weight (kg) 86.4 kg 86.3 kg 86.6 kg      Telemetry    HR trend is improving 80's-90's, occ pacing, some rates to 110s - Personally Reviewed  ECG    No new EKGs - Personally Reviewed  Physical Exam   Examined by Dr. Lovena Le General Appearance: In no apparent  distress, laying in bed, older than his age, chronically ill appearing HEENT: Normocephalic, atraumatic.  Neck: Neck is short and thick, difficult assess JVD Cardiovascular: Irregularly irregular, no murmur Respiratory: slightly diminished tat the bases, normal WOB Gastrointestinal: Large abdominal mass noted to the right  Extremities: s/p right foot partial amputation, trace edema of BLE Musculoskeletal: Generalized muscular atrophy, partial R foot amputation (old) Skin: Numerous skin tears and bruise of abdominal and extremities   Neurologic:AAO to self, place and situation Psychiatric: very pleasant and cooperative  Pacer site remains stable, steri strips in place No hematoma, no bleeding  Labs    High Sensitivity Troponin:   Recent Labs  Lab 03/23/21 1029 03/23/21 1451  TROPONINIHS 466* 701*      Chemistry Recent Labs  Lab 03/26/21 0024 03/27/21 0430 03/28/21 0617 03/28/21 1432 03/29/21 0028 03/30/21 0242  NA 135 134* 132* 131* 131* 130*  K 4.7 4.5 5.6* 5.9* 5.1  5.1 4.7  CL 105 105 104 105 102 100  CO2 19* 21* 23 16* 19* 22  GLUCOSE 129* 106* 263* 198* 202* 225*  BUN 81* 84* 84* 89* 85* 85*  CREATININE 2.01* 1.77* 1.91*  1.93* 1.96* 1.92*  CALCIUM 8.2* 8.4* 8.2* 8.1* 8.1* 8.2*  PROT 5.9* 5.7* 5.9*  --   --   --   ALBUMIN 2.3* 2.2* 2.2*  --   --   --   AST '26 23 18  '$ --   --   --   ALT '23 21 26  '$ --   --   --   ALKPHOS 69 68 73  --   --   --   BILITOT 0.5 0.5 0.5  --   --   --   GFRNONAA 36* 42* 38* 38* 37* 38*  ANIONGAP '11 8 5 10 10 8     '$ Hematology Recent Labs  Lab 03/26/21 0024 03/28/21 0617 03/30/21 0242  WBC 12.6* 10.0 8.7  RBC 3.36* 3.56* 3.62*  HGB 9.9* 10.6* 10.8*  HCT 31.8* 32.9* 33.7*  MCV 94.6 92.4 93.1  MCH 29.5 29.8 29.8  MCHC 31.1 32.2 32.0  RDW 16.5* 17.2* 17.3*  PLT 184 229 213    BNPNo results for input(s): BNP, PROBNP in the last 168 hours.   DDimer No results for input(s): DDIMER in the last 168 hours.   Radiology    DG  Chest 2 View Result Date: 03/22/2021 CLINICAL DATA:  Decreased breath sounds at right lung base. Chest pain with nonproductive cough for 2 weeks. EXAM: CHEST - 2 VIEW COMPARISON:  Radiograph 06/30/2020. FINDINGS: Similar low lung volumes. Patient is post median sternotomy. Similar cardiomegaly with aortic atherosclerosis and tortuosity there is patchy airspace disease at the right lung base, new from prior exam. Background peribronchial and interstitial thickening is similar. Minor subsegmental atelectasis at the left lung base. Suspected small right pleural effusion. No pneumothorax. Scoliotic curvature of the spine. IMPRESSION: 1. Patchy airspace disease at the right lung base, suspicious for pneumonia. Followup PA and lateral chest X-ray is recommended in 3-4 weeks following trial of antibiotic therapy to ensure resolution and exclude underlying malignancy. 2. Suspected small right pleural effusion. 3. Stable cardiomegaly. Stable mild diffuse interstitial and bronchial thickening suggesting chronic lung disease, possible COPD. Electronically Signed   By: Keith Rake M.D.   On: 03/22/2021 23:51   DG CHEST PORT 1 VIEW Result Date: 03/23/2021 CLINICAL DATA:  Septic shock bradycardia EXAM: PORTABLE CHEST 1 VIEW COMPARISON:  03/23/2021, 03/22/2021 FINDINGS: Post sternotomy changes. Cardiomegaly with small right-sided pleural effusion. Worsening airspace disease at the right base. Aortic atherosclerosis. No pneumothorax. IMPRESSION: 1. Cardiomegaly with small pleural effusion and worsening edema or pneumonia at the right lung base. Electronically Signed   By: Donavan Foil M.D.   On: 03/23/2021 22:17     Cardiac Studies    8/1/922: TTE (limited) IMPRESSIONS   1. Left ventricular ejection fraction, by estimation, is 20%. The left  ventricle demonstrates global hypokinesis. The left ventricular internal  cavity size was mildly dilated. Left ventricular diastolic parameters are  indeterminate.   2.  Right ventricular systolic function is moderately reduced. The right  ventricular size is mildly enlarged. Tricuspid regurgitation signal is  inadequate for assessing PA pressure.   3. Left atrial size was mildly dilated.   4. Doppler not assessed. No evidence of mitral stenosis. Moderate mitral  annular calcification.   5. Doppler not assessed. The aortic valve is tricuspid.   6. The inferior vena cava is normal in size with <50% respiratory  variability, suggesting right atrial pressure of 8 mmHg.   7. Limited echo. The patient was tachycardic in atrial fibrillation, rate  in 130s generally.  03/23/21 TTE IMPRESSIONS   1. Left ventricular ejection fraction, by estimation, is 25 to 30%. The  left ventricle has severely decreased function. The left ventricle  demonstrates global hypokinesis. The left ventricular internal cavity size  was moderately dilated.   2. Right ventricular systolic function was not well visualized. The right  ventricular size is not well visualized. There is moderately elevated  pulmonary artery systolic pressure.   3. Left atrial size was moderately dilated.   4. Right atrial size was moderately dilated.   5. The mitral valve is abnormal. Mild mitral valve regurgitation. No  evidence of mitral stenosis. Moderate mitral annular calcification.   6. The aortic valve was not well visualized. Aortic valve regurgitation  is not visualized. Mild to moderate aortic valve sclerosis/calcification  is present, without any evidence of aortic stenosis.   7. The inferior vena cava is normal in size with greater than 50%  respiratory variability, suggesting right atrial pressure of 3 mmHg.    09/18/2017; TTE Study Conclusions  - Left ventricle: The cavity size was normal. Wall thickness was    increased in a pattern of mild LVH. Indeterminant diastolic    function, atrial fibrillation. Systolic function was mildly to    moderately reduced. The estimated ejection fraction  was in the    range of 40% to 45%. Diffuse hypokinesis.  - Aortic valve: There was no stenosis.  - Mitral valve: Moderately calcified annulus. There was no    significant regurgitation.  - Left atrium: The atrium was mildly dilated.  - Right ventricle: The cavity size was mildly to moderately    dilated. Pacer wire or catheter noted in right ventricle.    Systolic function was mildly reduced.  - Right atrium: The atrium was mildly dilated.  - Tricuspid valve: Peak RV-RA gradient (S): 32 mm Hg.  - Pulmonary arteries: PA peak pressure: 35 mm Hg (S).  - Inferior vena cava: The vessel was normal in size. The    respirophasic diameter changes were in the normal range (>= 50%),    consistent with normal central venous pressure.   Impressions:  - The patient was in atrial fibrillation. Normal LV size with mild    LV hypertrophy. EF 40-45%. Mild-moderate RV dilation with mildly    decreased systolic function. Borderline pulmonary hypertension.    Patient Profile     65 y.o. male  with a hx of some degree of developmental delay, cared for by his sister, CAD (CABG 2017), chronic CHF (diastolic), PVD (s/p LCEA 0000000 > non-obstructive carotid disease since, follows with [facility]), h/o partial foot amputations 2/2 diabetic ulcer (2014),  COPD (home O2), prior stroke, DM, HTN, HLD, AFib, large abd hernia/mass   Had a syncopal event with EMS observed to have period of asystole and in the ER recurrent long/symptomatic pauses/asystole intermittent compressions > epi gtt > temp pacing wire  COVID + (+/-pneumonia)  Cardiology called and did not think he would be a good candidate for pacer given comorbidities, poor hygiene, skin wounds, and infection risk and concerns of post procedure ability to follow restrictions  Subsequently conversations with CCM and family planned for palliative eval, goals of care, and monitoring clinical progress  EP was asked to weigh in   Assessment & Plan    Syncope CHB,  recurrent pauses/asystolic events on telemetry > temp pacing wire Temp wire became unstable/intermittent LOC Froday S/p CRT-P implant Friday 8/19 w/Dr. Curt Bears LV lead currently programmed OFF  site is stable POD #1 CXR  without ptx POD#1 pacer check with intact function I have d/w the patient wound care and activity restrictions Routine post pacing follow up is in place   2. PAFib CHA2DS2Vasc is 7, out pt on his eliquis 2.'5mg'$  BID (inappropriately dosed) >> heparin here >> Eliquis '5mg'$  BID is the appropriate dose for the patient Rates are improving titrate coreg dose Amiodarone historically, stopped about a year ago I find a note with some concern perhaps of an abnormal CXR and concerns of amio toxicity ? D/w Dr. Lovena Le, will stop amiodarone, plan rate control strategy   3. COVID  +/- pneumonia (03/23/21) Still on isolation Respiratory failure with improving O2 requirements Afebrile WBC steadily improving 8.7 today O2 requirments are improved  >> remains OFF O2 Denies SOB Minimal cough C/w CCM/pulmonary   4. New CM 5. VT required external CV Fridap (pre-pacer) This was suspect to have been provoked by temp wire given he had not had any VT/V arrhythmias prior to the wire becoming unstable Repeat echo with persistent LVEF 20%, Dr. Curt Bears made aware prior to implant RBBB at baseline  D/w Dr. Irish Lack, wound not pursue ischemic evaluation at this juncture, and agreed, PPM was most reasonable plan CRT-P implanted Friday Getting home PO lasix Fluid neg the last few days, yesterday by I/O -2440m, though charted weights essentially unchanged CKD (III) by labs here, III-IV, no labs in Epic for quite some time Suspect he is at about his baseline renal disease. For now, will not add ACE/ARB/Entresto, plan for titrate coreg Close out patient follow up to re-establish with cardiology service is in place   At discharge from our standpoint: NO AMIODARONE NO TOPROL Continue coreg '25mg'$   BID CHANGE Eliquis to '5mg'$  BID   Dr. TLovena Lehas seen and examined the patient OK to out of ICU and OK to discharge from EP perspective when ready medically otherwise   For questions or updates, please contact [facility] Please consult [email or web address] for contact info under   Signed, RBaldwin Jamaica PA-C  03/30/2021, 8:14 AM    EP Attending  Patient seen and examined. Agree with above. The patient's VR in atrial fib is still not well controlled. We will uptitrate the carvedilol as bp is still high and hopefully he can be discharged soon.   GCarleene OverlieTaylor,MD

## 2021-03-30 NOTE — Progress Notes (Signed)
Physical Therapy Treatment Patient Details Name: [name] MRN: [id number] DOB: 10/22/1955 Today's Date: 03/30/2021    History of Present Illness This 65 y.o. male admitted 8/16 after OOH cardiac arrest with continued episodes of unresponsiveness/asystole requiring brief CPR vs stimulations. PPM 8/19 due to heart block. covid (+). PMH includes: developmental delay, benign abdominal tumor, tachy-brady syndrome, acute hypoxic respiratory failure, paroxysmal A_fib, sepsis, CAP, DM, CKD stage IV, COPD, HTN, bil. carotid stenosis, h/o CVAs, s/p Rt transmet amputation    PT Comments    Pt pleasant and very willing to work with therapies this session. Pt able to state that he cannot push or lift with LUE but needs mod cues to follow precuations with transfers and mobility. Pt with increased activity tolerance able to perform transfers with decreased assist and progress to limited gait. Continue to recommend SNF based on function and decreased assist available at home. Will continue to follow.      Follow Up Recommendations  SNF;Supervision/Assistance - 24 hour     Equipment Recommendations  None recommended by PT    Recommendations for Other Services       Precautions / Restrictions Precautions Precautions: Fall;ICD/Pacemaker Precaution Comments: Large R hernia, Covid Restrictions Weight Bearing Restrictions: No    Mobility  Bed Mobility Overal bed mobility: Needs Assistance Bed Mobility: Supine to Sit     Supine to sit: +2 for physical assistance;Min assist;HOB elevated     General bed mobility comments: HOB to 45degrees. Min A to move LEs to EOB. Pt used bedrails to elevate trunk to seated position. Assist of pelvis to pivot to EOB. Multimodal cues provided for pacemaker precautions and hand placement.    Transfers Overall transfer level: Needs assistance Equipment used: Rolling walker (2 wheeled) Transfers: Sit to/from [facility] Sit to Stand: +2  physical assistance;Min assist Stand pivot transfers: Min assist;+2 physical assistance       General transfer comment: Sit to stand x5. Min A +2 to power up into standing position. Verbal cues provided for upright posture and hand placement. Assist for anterior translation and rise  Ambulation/Gait Ambulation/Gait assistance: Min assist;+2 safety/equipment Gait Distance (Feet): 16 Feet Assistive device: Rolling walker (2 wheeled) Gait Pattern/deviations: Step-to pattern;Trunk flexed   Gait velocity interpretation: <1.31 ft/sec, indicative of household ambulator General Gait Details: Ambulated 2', 16', 58f with min A +2 for chair follow. Fatigues quickly.   Stairs             Wheelchair Mobility    Modified Rankin (Stroke Patients Only)       Balance Overall balance assessment: Needs assistance Sitting-balance support: Feet supported;Bilateral upper extremity supported Sitting balance-Leahy Scale: Poor Sitting balance - Comments: requires UE support   Standing balance support: Bilateral upper extremity supported;During functional activity Standing balance-Leahy Scale: Poor Standing balance comment: Requires BUE on RW                            Cognition Arousal/Alertness: Awake/alert Behavior During Therapy: WFL for tasks assessed/performed Overall Cognitive Status: Impaired/Different from baseline Area of Impairment: Memory                     Memory: Decreased recall of precautions         General Comments: Follows commands accurately, needs cues to maintain PPM precautions      Exercises      General Comments General comments (skin integrity, edema, etc.): HR 97-120, spO2 89-99%  on RA. Decreased skin integrity on RLE      Pertinent Vitals/Pain Pain Assessment: 0-10 Pain Score: 3  Pain Location: RLE and LLE wounds Pain Descriptors / Indicators: Sore Pain Intervention(s): Limited activity within patient's tolerance;Monitored  during session;Repositioned    Home Living                      Prior Function            PT Goals (current goals can now be found in the care plan section) Progress towards PT goals: Progressing toward goals    Frequency    Min 3X/week      PT Plan Current plan remains appropriate    Co-evaluation PT/OT/SLP Co-Evaluation/Treatment: Yes Reason for Co-Treatment: Complexity of the patient's impairments (multi-system involvement) PT goals addressed during session: Mobility/safety with mobility        AM-PAC PT "6 Clicks" Mobility   Outcome Measure  Help needed turning from your back to your side while in a flat bed without using bedrails?: A Little Help needed moving from lying on your back to sitting on the side of a flat bed without using bedrails?: A Lot Help needed moving to and from a bed to a chair (including a wheelchair)?: A Lot Help needed standing up from a chair using your arms (e.g., wheelchair or bedside chair)?: A Little Help needed to walk in hospital room?: A Lot Help needed climbing 3-5 steps with a railing? : Total 6 Click Score: 13    End of Session Equipment Utilized During Treatment: Gait belt Activity Tolerance: Patient tolerated treatment well Patient left: in chair;with call bell/phone within reach;with chair alarm set Nurse Communication: Mobility status PT Visit Diagnosis: Unsteadiness on feet (R26.81);Muscle weakness (generalized) (M62.81)     Time: [id number] PT Time Calculation (min) (ACUTE ONLY): 29 min  Charges:  $Therapeutic Activity: 8-22 mins                     Preslie Depasquale P, PT Acute Rehabilitation Services Pager: [phone] Office: [place] Mcgaugh 03/30/2021, 12:20 PM

## 2021-03-30 NOTE — Progress Notes (Addendum)
   Subjective: I seen and evaluated Mr. Coyne at bedside.  He states that he is feeling much better.  He denies headache, chest pain, SOB, abdominal pain, lower extremity swelling or pain.   Objective:  Vital signs in last 24 hours: Vitals:   03/30/21 0900 03/30/21 1000 03/30/21 1100 03/30/21 1200  BP: (!) 150/79 134/75 140/82 (!) 145/73  Pulse: (!) 44 (!) 130 76 67  Resp:  (!) 22 18 (!) 29  Temp:      TempSrc:      SpO2: 96% 94% 98% 99%  Weight:      Height:       Physical Exam Constitutional:      General: He is awake.  HENT:     Head: Normocephalic and atraumatic.  Cardiovascular:     Heart sounds: Normal heart sounds.  Pulmonary:     Effort: Pulmonary effort is normal.     Breath sounds: Normal breath sounds.     Comments: Dry coughing throughout interview Chest:     Comments: ICD placement on left upper chest Abdominal:     General: Abdomen is protuberant. Bowel sounds are normal.     Palpations: Abdomen is soft.     Tenderness: There is no abdominal tenderness.  Musculoskeletal:     Right lower leg: No edema.     Left lower leg: No edema.  Skin:    General: Skin is warm and dry.  Neurological:     Mental Status: He is alert. Mental status is at baseline.  Psychiatric:        Mood and Affect: Mood normal.        Behavior: Behavior normal. Behavior is cooperative.     Assessment/Plan:  Active Problems:   Goals of care, counseling/discussion   Septic shock (HCC)   Cardiac asystole (HCC)   History of cardioembolic cerebrovascular accident (CVA)   Chronic kidney disease  Tachy/brady syndrome complicated by OOH asystole cardiac arrest s/p 3 recurrent arrests S/p PPM 8/19 Paroxysmal atrial fibrillation Patient has been HDS for the past 24 hours. Amiodarone has been discontinued per cardiology.  Cleared to be transferred out of ICU --Tylenol 325-'650mg'$  Q4PRN for pain --Continue Eliquis '5mg'$  BID --Aspirin daily --Carvedilol 6.'5mg'$  BID (titrate as  tolerated) --Lasix '80mg'$  BID --Cardiology recommendation appreciated   COVID 19 infection Patient tested positive for COVID on admission 03/23/21. He denied any symptoms at the time.  Patient is on air bourne and contact precaution. Patient is currently sating on RA. Patient reports a dry cough, unchanged from previous day. Dexamethasone discontinued due to steroid induced hyperglycemia and patient overall improvement and no worsening symptoms of COVID.  --Monitor for new or worsening symptoms  Intertrigo of groin Today nurse informed medicine team of a rash located in groin. Appears to be yeast. Likely worsened by glucosuria/incontinence, discontinued decadron. --Nystatin powder ordered --Monitor for improvement  CKD IIIb -At baseline, continue to monitor --Continue home medications    T2DM c/b diabetic neuropathy --SSI,Resistant scale --LANUS increased to 25units daily; due to increase in CBG ranging in the 200s   HLD --Continue home medication; Atorvastatin '80mg'$  daily  Prior to Admission Living Arrangement: HOME/PACE Anticipated Discharge Location:SNF Barriers to Discharge: rate control/afib Dispo: Anticipated discharge in approximately 1-2 day(s).   Timothy Lasso, MD 03/30/2021, 1:31 PM Pager: [phone] After 5pm on weekdays and 1pm on weekends: On Call pager [phone]

## 2021-03-30 NOTE — NC FL2 (Signed)
[place] LEVEL OF CARE SCREENING TOOL     IDENTIFICATION  Patient Name: [name] Birthdate: [birth year] Sex: male Admission Date (Current Location): 03/23/2021  [facility] and [place] Number:  Herbalist and Address:  [facility], [place] [address] [place], [place] [address]      Provider Number: [id number]  Attending Physician Name and Address:  Lucious Groves, DO  Relative Name and Phone Number:  [name] [phone]    Current Level of Care: Hospital Recommended Level of Care: [place] Prior Approval Number:    Date Approved/Denied:   PASRR Number: [id number]  Discharge Plan: SNF    Current Diagnoses: Patient Active Problem List   Diagnosis Date Noted   Chronic kidney disease    Cardiac asystole ([place]) 03/23/2021   History of cardioembolic cerebrovascular accident (CVA)    Fluid overload    Hyponatremia    Anemia of chronic disease    Pressure injury of skin 09/26/2017   AKI (acute kidney injury) ([place])    Aspiration pneumonia of both lower lobes ([place])    Influenza with respiratory manifestation    Tachy-brady syndrome ([place])    Sinus pause    Intertrigo    Right bundle branch block (RBBB) on electrocardiography    Dysphagia    Septic shock ([place]) 02/13/2016   Ischemic cardiomyopathy 09/17/2015   CAD (coronary artery disease) 07/14/2015   Chronic combined systolic and diastolic CHF (congestive heart failure) ([place]) 07/14/2015   Abrasion of abdominal wall 10/26/2014   Acute respiratory failure with hypoxia ([place]) 10/26/2014   Gastritis and gastroduodenitis 06/10/2014   Benign neoplasm of ascending colon 06/10/2014   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 ([place]) 02/14/2013   Wound, open, foot 12/13/2012   IDA (iron deficiency anemia) 12/13/2012   Hyperkalemia 05/22/2012   Diabetic leg ulcer ([place]) 05/15/2012   Goals of care, counseling/discussion 05/15/2012   Leg weakness,  bilateral 12/10/2011   Carotid artery disease ([place]) 08/31/2011   Sciatica 05/22/2011   Ventral hernia without obstruction or gangrene 01/24/2011   Incidental lung nodule, greater than or equal to 68m 11/24/2010   CVA (cerebral vascular accident) ([place] 10/24/2010   PAF (paroxysmal atrial fibrillation) ([place] 07/24/2009   Anxiety state 06/22/2007   DM (diabetes mellitus), type 2, uncontrolled ([place] 12/22/2006   Essential hypertension 12/22/2006   HYPERCHOLESTEROLEMIA 10/05/2006   TOBACCO DEPENDENCE 10/05/2006   Unspecified intellectual disabilities 10/05/2006   COPD (chronic obstructive pulmonary disease) ([place] 10/05/2006    Orientation RESPIRATION BLADDER Height & Weight     Self, Time, Situation, Place  O2 (Nasal Cannula 2 liters) Incontinent (Urethral catheter latex 16 Fr.) Weight: 190 lb 7.6 oz (86.4 kg) Height:  5' (152.4 cm)  BEHAVIORAL SYMPTOMS/MOOD NEUROLOGICAL BOWEL NUTRITION STATUS      Incontinent Diet (Please see discharge summary)  AMBULATORY STATUS COMMUNICATION OF NEEDS Skin   Limited Assist Verbally Other (Comment) (abrasion,arm,foot,bil.,amputation foot,R,ecchymosis,arm,leg,bil.,excoriated,groin,bil.,Incision closed chest,L,upper,gauze,dry,intact,old drainage,dressing place,wound incision open or dehisced abdomen lower,R,abrasion,foam lift dressing,every 3 days)                       Personal Care Assistance Level of Assistance  Bathing, Feeding, Dressing Bathing Assistance: Maximum assistance Feeding assistance: Independent (able to feed self) Dressing Assistance: Maximum assistance     Functional Limitations Info  Sight, Hearing, Speech Sight Info: Impaired Hearing Info: Impaired Speech Info: Adequate    SPECIAL CARE FACTORS FREQUENCY  PT (By licensed PT), OT (  By licensed OT)     PT Frequency: 5x min weekly OT Frequency: 5x min weekly            Contractures Contractures Info: Not present    Additional Factors Info  Code Status, Allergies,  Insulin Sliding Scale, Psychotropic Code Status Info: FULL Allergies Info: Penicillins Psychotropic Info: PARoxetine (PAXIL) tablet 40 mg daily Insulin Sliding Scale Info: insulin aspart (novoLOG) injection 0-20 Units 3 times daily with meals,insulin glargine-yfgn [facility]) injection 25 Units daily       Current Medications (03/30/2021):  This is the current hospital active medication list Current Facility-Administered Medications  Medication Dose Route Frequency Provider Last Rate Last Admin   acetaminophen (TYLENOL) tablet 325-650 mg  325-650 mg Oral Q4H PRN Constance Haw, MD   650 mg at 03/29/21 2225   apixaban (ELIQUIS) tablet 5 mg  5 mg Oral BID Constance Haw, MD   5 mg at 03/30/21 1028   aspirin EC tablet 81 mg  81 mg Oral Daily Omar Person, NP   81 mg at 03/30/21 1028   atorvastatin (LIPITOR) tablet 80 mg  80 mg Oral Daily Omar Person, NP   80 mg at 03/30/21 1028   carvedilol (COREG) tablet 25 mg  25 mg Oral BID WC Baldwin Jamaica, PA-C   25 mg at 03/30/21 [id number]   Chlorhexidine Gluconate Cloth 2 % PADS 6 each  6 each Topical Daily Candee Furbish, MD   6 each at 03/29/21 1004   dextrose 50 % solution 0-50 mL  0-50 mL Intravenous PRN Candee Furbish, MD   50 mL at 03/27/21 1223   docusate sodium (COLACE) capsule 100 mg  100 mg Oral BID PRN Martinique, Peter M, MD       furosemide (LASIX) tablet 80 mg  80 mg Oral BID Candee Furbish, MD   80 mg at 03/30/21 [id number]   guaiFENesin (MUCINEX) 12 hr tablet 1,200 mg  1,200 mg Oral BID Omar Person, NP   1,200 mg at 03/30/21 1029   guaiFENesin-dextromethorphan (ROBITUSSIN DM) 100-10 MG/5ML syrup 5 mL  5 mL Oral Q4H PRN Omar Person, NP       insulin aspart (novoLOG) injection 0-20 Units  0-20 Units Subcutaneous TID WC Candee Furbish, MD   4 Units at 03/30/21 1316   insulin glargine-yfgn (SEMGLEE) injection 25 Units  25 Units Subcutaneous Daily Timothy Lasso, MD   25 Units at 03/30/21 1029   mupirocin  ointment (BACTROBAN) 2 % 1 application  1 application Nasal BID Candee Furbish, MD   1 application at 0000000 1029   nystatin (MYCOSTATIN/NYSTOP) topical powder   Topical TID Mitzi Hansen, MD   Given at 03/30/21 1319   ondansetron (ZOFRAN) injection 4 mg  4 mg Intravenous Q6H PRN [place], Will Hassell Done, MD       pantoprazole (PROTONIX) EC tablet 40 mg  40 mg Oral Daily Hayden Pedro M, NP   40 mg at 03/30/21 1028   PARoxetine (PAXIL) tablet 40 mg  40 mg Oral Daily Omar Person, NP   40 mg at 03/30/21 1028   polyethylene glycol (MIRALAX / GLYCOLAX) packet 17 g  17 g Oral Daily PRN Martinique, Peter M, MD       sodium chloride flush (NS) 0.9 % injection 3 mL  3 mL Intravenous Q12H Baldwin Jamaica, PA-C   3 mL at 03/29/21 2230   umeclidinium bromide (INCRUSE ELLIPTA) 62.5 MCG/INH 1 puff  1  puff Inhalation Daily Candee Furbish, MD   1 puff at 03/30/21 [id number]     Discharge Medications: Please see discharge summary for a list of discharge medications.  Relevant Imaging Results:  Relevant Lab Results:   Additional Information [id number] Covid +  [place], [place]

## 2021-03-31 ENCOUNTER — Inpatient Hospital Stay (HOSPITAL_COMMUNITY): Payer: Medicare (Managed Care)

## 2021-03-31 DIAGNOSIS — E1165 Type 2 diabetes mellitus with hyperglycemia: Secondary | ICD-10-CM | POA: Diagnosis not present

## 2021-03-31 DIAGNOSIS — U071 COVID-19: Secondary | ICD-10-CM | POA: Diagnosis not present

## 2021-03-31 DIAGNOSIS — I480 Paroxysmal atrial fibrillation: Secondary | ICD-10-CM | POA: Diagnosis not present

## 2021-03-31 DIAGNOSIS — I469 Cardiac arrest, cause unspecified: Secondary | ICD-10-CM | POA: Diagnosis not present

## 2021-03-31 DIAGNOSIS — R55 Syncope and collapse: Secondary | ICD-10-CM | POA: Diagnosis not present

## 2021-03-31 LAB — BASIC METABOLIC PANEL
Anion gap: 6 (ref 5–15)
BUN: 88 mg/dL — ABNORMAL HIGH (ref 8–23)
CO2: 24 mmol/L (ref 22–32)
Calcium: 8.2 mg/dL — ABNORMAL LOW (ref 8.9–10.3)
Chloride: 99 mmol/L (ref 98–111)
Creatinine, Ser: 1.91 mg/dL — ABNORMAL HIGH (ref 0.61–1.24)
GFR, Estimated: 38 mL/min — ABNORMAL LOW (ref >60)
Glucose, Bld: 140 mg/dL — ABNORMAL HIGH (ref 70–99)
Potassium: 4.6 mmol/L (ref 3.5–5.1)
Sodium: 129 mmol/L — ABNORMAL LOW (ref 135–145)

## 2021-03-31 LAB — GLUCOSE, CAPILLARY
Glucose-Capillary: 121 mg/dL — ABNORMAL HIGH (ref 70–99)
Glucose-Capillary: 109 mg/dL — ABNORMAL HIGH (ref 70–99)
Glucose-Capillary: 101 mg/dL — ABNORMAL HIGH (ref 70–99)
Glucose-Capillary: 126 mg/dL — ABNORMAL HIGH (ref 70–99)
Glucose-Capillary: 103 mg/dL — ABNORMAL HIGH (ref 70–99)

## 2021-03-31 LAB — IRON AND TIBC
Iron: 37 ug/dL — ABNORMAL LOW (ref 45–182)
Saturation Ratios: 23 % (ref 17.9–39.5)
TIBC: 158 ug/dL — ABNORMAL LOW (ref 250–450)
UIBC: 121 ug/dL

## 2021-03-31 LAB — FERRITIN: Ferritin: 732 ng/mL — ABNORMAL HIGH (ref 24–336)

## 2021-03-31 MED ORDER — METFORMIN HCL 500 MG PO TABS
500.0000 mg | ORAL_TABLET | Freq: Every day | ORAL | Status: DC
  Administered 2021-04-01 – 2021-04-02 (×2): 500 mg via ORAL
  Filled 2021-03-31 (×2): qty 1

## 2021-03-31 MED ORDER — TAMSULOSIN HCL 0.4 MG PO CAPS
0.4000 mg | ORAL_CAPSULE | Freq: Every day | ORAL | Status: DC
  Administered 2021-03-31 – 2021-04-02 (×3): 0.4 mg via ORAL
  Filled 2021-03-31 (×3): qty 1

## 2021-03-31 MED ORDER — LOSARTAN POTASSIUM 25 MG PO TABS
12.5000 mg | ORAL_TABLET | Freq: Every day | ORAL | Status: DC
  Administered 2021-03-31 – 2021-04-02 (×3): 12.5 mg via ORAL
  Filled 2021-03-31 (×3): qty 1

## 2021-03-31 NOTE — Progress Notes (Addendum)
  Speech Language Pathology Treatment: Dysphagia  Patient Details Name: [name] MRN: [id number] DOB: 08-Dec-1955 Today's Date: 03/31/2021 Time: 1152-1207 SLP Time Calculation (min) (ACUTE ONLY): 15 min  Assessment / Plan / Recommendation Clinical Impression  Pt was seen during lunch for dysphagia treatment. He was alert and cooperative during the session. Pt's RN reported that the pt has required cues to avoid a posterior head tilt during p.o. intake and that he continues to demonstrate impulsive tendencies during meals. Pt's baseline cough persists; however, this also inconsistently appeared to align with intake of solids or liquids, suggesting possible aspiration. Considering his history and current presentation, a modified barium swallow study is recommended to further assess physiology.    HPI HPI: Pt is a 65 y.o. male who presented on 8/16 with report of episodes of syncope and unresponsiveness. In the ED, he has recurrent asystolic events with rounds of CPR. PMT 8/17: continue full scope with watchful waiting. CXR 8/17: Right lower lobe consolidation which is suspicious for infection or  aspiration. PMH: mild developmental delay, HTN, HLD, Afib with RVR, tachy-brady syndrome (not a permanent pacemaker candidate), chronic combined systolic/diastolic HF ([id number] EF [id number]), CAD (MI 2016, s/p CABG 2017), CVA (2012), T2DM (c/b diabetic neuropathy, s/p R transmetatarsal amputation), CKD stage III. MBS 10/13/17: laryngeal penetation limited to flash with thin and contacting cords with thin via straw and exited vestibule. Timing of swallow functional with mildly incomplete epiglottic closure. Dysphagia 3 and thin liquids recommended      SLP Plan  MBS       Recommendations  Diet recommendations: Dysphagia 3 (mechanical soft);Thin liquid Liquids provided via: Cup;Straw Medication Administration: Whole meds with puree Supervision: Patient able to self feed Compensations: Slow rate;Small  sips/bites Postural Changes and/or Swallow Maneuvers: Seated upright 90 degrees                Oral Care Recommendations: Oral care BID;Patient independent with oral care Follow up Recommendations: None SLP Visit Diagnosis: Dysphagia, unspecified (R13.10) Plan: MBS       Eugena Rhue I. Hardin Negus, [place], [place] Office number [phone] Pager [phone]                Horton Marshall 03/31/2021, 1:11 PM

## 2021-03-31 NOTE — Progress Notes (Signed)
   Subjective: I seen and evaluated [name] at bedside. He is still under airborne and contact precaution. He states that he is feeling well. Denies any pain at this time. He expressed appreciation to the medicine team and staff for all their help in getting him well again. He acknowledges that he will be going to SNF following discharge.  Objective:  Vital signs in last 24 hours: Vitals:   03/31/21 0500 03/31/21 0600 03/31/21 0700 03/31/21 0853  BP: 111/73 119/61 110/73 113/71  Pulse: 71 82 72 82  Resp: '18 19 20   '$ Temp:      TempSrc:      SpO2: 97% 97% 98%   Weight: 87.2 kg     Height:       Physical Exam Constitutional:      General: He is awake.  HENT:     Head: Normocephalic and atraumatic.  Cardiovascular:     Rate and Rhythm: Normal rate.     Heart sounds: Normal heart sounds.  Pulmonary:     Effort: Pulmonary effort is normal.     Breath sounds: Normal breath sounds.  Abdominal:     General: Bowel sounds are normal.     Palpations: Abdomen is soft.  Musculoskeletal:     Right lower leg: No edema.     Left lower leg: No edema.  Skin:    General: Skin is warm and dry.  Neurological:     General: No focal deficit present.     Mental Status: He is alert. Mental status is at baseline.  Psychiatric:        Mood and Affect: Mood normal.        Behavior: Behavior normal. Behavior is cooperative.     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 Cleared to be transferred out of ICU. PT/OT recommends SNF and patient has preference for [facility] facility. --Pending SNF --Tylenol 325-'650mg'$  Q4PRN for pain --Eliquis '5mg'$   BID --Aspirin daily --Carvedilol '25mg'$  BID  --Lasix '80mg'$  BID --Cardiology recommendation appreciated   COVID 19 infection Patient tested positive for COVID on admission 03/23/21; Patient is on air bourne and contact precaution. Patient is currently sating >96% on RA. Patient states that his dry cough has lessened.   --Monitor for new or worsening symptoms ---Pending SNF; Repeat COVID test 04/02/21   Intertrigo of groin Yesterday nurse informed medicine team of a rash located in groin. Appears to be yeast. Likely worsened by glucosuria/incontinence, discontinued decadron. --Nystatin powder --Monitor for improvement   CKD IIIb -At baseline, continue to monitor --Continue home medications    T2DM c/b diabetic neuropathy CBG readings <200, acceptable --SSI,Resistant scale --SEMGLEE 25units daily   HLD --Atorvastatin '80mg'$  daily   Prior to Admission Living Arrangement: Anticipated Discharge Location: Barriers to Discharge: Dispo: Anticipated discharge in approximately 1-2 day(s).   Joseph Lasso, MD 03/31/2021, 8:57 AM Pager: [phone] After 5pm on weekdays and 1pm on weekends: On Call pager [phone]

## 2021-03-31 NOTE — Progress Notes (Signed)
SLP Cancellation Note  Patient Details Name: [name] MRN: [id number] DOB: [birth year]   Cancelled treatment:       Reason Eval/Treat Not Completed: Other (comment) (Pt arrived to radiology suite in wheelchair for MBS. However, pt was unable to transfer to the [facility] Imaging Chair for the study despite assistance from three staff members and the study could not be completed. The study will therefore need to postponed until 8/25 and pt will need to be transported in his bed to facilitate transfer to the chair. Pt's diet will be modified to dysphagia 3 and thin liquids will be continued.)  Elonzo Sopp I. Hardin Negus, [place], [place] Office number [phone] Pager [phone]  Horton Marshall 03/31/2021, 2:42 PM

## 2021-03-31 NOTE — Progress Notes (Addendum)
Telemetry reviewed with Dr. Lovena Le, HRs very well controlled 80's generally. No new recommendations from EP. EP and cardiology follow up is in place [place], [place]

## 2021-03-31 NOTE — TOC Progression Note (Signed)
Transition of Care [facility]) - Progression Note    Patient Details  Name: [name] MRN: [id number] Date of Birth: 1956-05-26  Transition of Care [facility] CM/SW [place], [place] Phone Number: 03/31/2021, 3:47 PM  Clinical Narrative:     CSW spoke with [facility] with [facility].Perrin Smack confirmed they can accept patient for SNF placement on 26th , when patient is off covid precautions if medically ready. CSW notified Olivia Mackie with [facility] will provide transportation for patient at dc. [facility] contact is Buddy Duty. CSW will continue to follow and assist with dc planning needs.  Expected Discharge Plan: [place] Barriers to Discharge: Continued Medical Work up  Expected Discharge Plan and Services Expected Discharge Plan: [place] In-house Referral: Clinical Social Work     Living arrangements for the past 2 months: Single Family Home                                       Social Determinants of Health (SDOH) Interventions    Readmission Risk Interventions No flowsheet data found.

## 2021-03-31 NOTE — Progress Notes (Signed)
Interim:  Patient [name] was removed this morning around 5:15 AM.  Nurses reported that patient has not produced urine since then.  Bladder scan reveals 300 mL.  Patient started on 0.4 mg Flomax.  Bladder scans every 8 hours.  Urine in bladder >400, straight cath.  Monitor I's and O's.  Patient started on metformin 500 mg daily for type 2 diabetes management.  Patient started on losartan 12.5 mg daily for HFrEF management.     Timothy Lasso, MD 03/31/2021, 2:59 PM Pager: [phone] After 5pm on weekdays and 1pm on weekends: On Call pager [phone]

## 2021-03-31 NOTE — Plan of Care (Signed)
?  Problem: Education: ?Goal: Knowledge of General Education information will improve ?Description: Including pain rating scale, medication(s)/side effects and non-pharmacologic comfort measures ?Outcome: Progressing ?  ?Problem: Health Behavior/Discharge Planning: ?Goal: Ability to manage health-related needs will improve ?Outcome: Progressing ?  ?Problem: Clinical Measurements: ?Goal: Ability to maintain clinical measurements within normal limits will improve ?Outcome: Progressing ?Goal: Will remain free from infection ?Outcome: Progressing ?Goal: Respiratory complications will improve ?Outcome: Progressing ?Goal: Cardiovascular complication will be avoided ?Outcome: Progressing ?  ?Problem: Activity: ?Goal: Risk for activity intolerance will decrease ?Outcome: Progressing ?  ?Problem: Nutrition: ?Goal: Adequate nutrition will be maintained ?Outcome: Progressing ?  ?Problem: Coping: ?Goal: Level of anxiety will decrease ?Outcome: Progressing ?  ?Problem: Pain Managment: ?Goal: General experience of comfort will improve ?Outcome: Progressing ?  ?Problem: Safety: ?Goal: Ability to remain free from injury will improve ?Outcome: Progressing ?  ?

## 2021-04-01 ENCOUNTER — Inpatient Hospital Stay (HOSPITAL_COMMUNITY): Payer: Medicare (Managed Care)

## 2021-04-01 ENCOUNTER — Ambulatory Visit: Payer: Medicare (Managed Care) | Admitting: Student

## 2021-04-01 DIAGNOSIS — E1165 Type 2 diabetes mellitus with hyperglycemia: Secondary | ICD-10-CM | POA: Diagnosis not present

## 2021-04-01 DIAGNOSIS — Z950 Presence of cardiac pacemaker: Secondary | ICD-10-CM | POA: Diagnosis not present

## 2021-04-01 DIAGNOSIS — I469 Cardiac arrest, cause unspecified: Secondary | ICD-10-CM | POA: Diagnosis not present

## 2021-04-01 DIAGNOSIS — R339 Retention of urine, unspecified: Secondary | ICD-10-CM | POA: Diagnosis not present

## 2021-04-01 DIAGNOSIS — U071 COVID-19: Secondary | ICD-10-CM | POA: Diagnosis present

## 2021-04-01 LAB — BASIC METABOLIC PANEL
Anion gap: 9 (ref 5–15)
BUN: 100 mg/dL — ABNORMAL HIGH (ref 8–23)
CO2: 22 mmol/L (ref 22–32)
Calcium: 8.1 mg/dL — ABNORMAL LOW (ref 8.9–10.3)
Chloride: 98 mmol/L (ref 98–111)
Creatinine, Ser: 1.87 mg/dL — ABNORMAL HIGH (ref 0.61–1.24)
GFR, Estimated: 39 mL/min — ABNORMAL LOW (ref >60)
Glucose, Bld: 84 mg/dL (ref 70–99)
Potassium: 4.9 mmol/L (ref 3.5–5.1)
Sodium: 129 mmol/L — ABNORMAL LOW (ref 135–145)

## 2021-04-01 LAB — GLUCOSE, CAPILLARY
Glucose-Capillary: 93 mg/dL (ref 70–99)
Glucose-Capillary: 94 mg/dL (ref 70–99)
Glucose-Capillary: 77 mg/dL (ref 70–99)
Glucose-Capillary: 107 mg/dL — ABNORMAL HIGH (ref 70–99)
Glucose-Capillary: 112 mg/dL — ABNORMAL HIGH (ref 70–99)

## 2021-04-01 MED ORDER — INSULIN ASPART 100 UNIT/ML IJ SOLN: 0.0000 [IU] | Freq: Three times a day (TID) | INTRAMUSCULAR | Status: DC

## 2021-04-01 MED ORDER — INSULIN GLARGINE-YFGN 100 UNIT/ML ~~LOC~~ SOLN
12.0000 [IU] | Freq: Every day | SUBCUTANEOUS | Status: DC
  Administered 2021-04-01: 12 [IU] via SUBCUTANEOUS
  Filled 2021-04-01 (×2): qty 0.12

## 2021-04-01 MED ORDER — CARVEDILOL 12.5 MG PO TABS
12.5000 mg | ORAL_TABLET | Freq: Two times a day (BID) | ORAL | Status: DC
  Administered 2021-04-01 – 2021-04-02 (×2): 12.5 mg via ORAL
  Filled 2021-04-01 (×2): qty 1

## 2021-04-01 NOTE — Progress Notes (Signed)
HD#9 Subjective:   Patient feeling so-so this morning, says that his mouth is dry. Does endorse some nasal congestion and that he is breathing with his mouth open because of this. Otherwise doing ok, not having any increased shortness of breath. Still has a dry cough which gives him some pain in his chest because of the effort of coughing.   Objective:  Vital signs in last 24 hours: Vitals:   04/01/21 0000 04/01/21 0119 04/01/21 0400 04/01/21 0500  BP: 98/66   99/61  Pulse: 75   72  Resp: 20   19  Temp:    98.1 F (36.7 C)  TempSrc:    Oral  SpO2: 96%   95%  Weight:  93.1 kg 93.1 kg   Height:       Supplemental O2: Room Air SpO2: 95 % O2 Flow Rate (L/min): 2 L/min   Physical Exam:  Constitutional: Elderly man lying in bed, in no acute distress Cardiovascular: regular rate and rhythm Pulmonary/Chest: normal work of breathing on room air, transmitted upper airway sounds Abdominal: soft, non-tender, distended MSK: moves extremities spontaneously Neurological: alert answering questions appropriately  Psych: Normal affect  Filed Weights   03/31/21 1808 04/01/21 0119 04/01/21 0400  Weight: 89.7 kg 93.1 kg 93.1 kg     Intake/Output Summary (Last 24 hours) at 04/01/2021 0835 Last data filed at 04/01/2021 0800 Gross per 24 hour  Intake 250 ml  Output 2410 ml  Net -2160 ml   Net IO Since Admission: -3,387.07 mL [04/01/21 0835]  Pertinent Labs: CBC Latest Ref Rng & Units 03/30/2021 03/28/2021 03/26/2021  WBC 4.0 - 10.5 K/uL 8.7 10.0 12.6(H)  Hemoglobin 13.0 - 17.0 g/dL 10.8(L) 10.6(L) 9.9(L)  Hematocrit 39.0 - 52.0 % 33.7(L) 32.9(L) 31.8(L)  Platelets 150 - 400 K/uL 213 229 184    CMP Latest Ref Rng & Units 04/01/2021 03/31/2021 03/30/2021  Glucose 70 - 99 mg/dL 84 140(H) 225(H)  BUN 8 - 23 mg/dL 100(H) 88(H) 85(H)  Creatinine 0.61 - 1.24 mg/dL 1.87(H) 1.91(H) 1.92(H)  Sodium 135 - 145 mmol/L 129(L) 129(L) 130(L)  Potassium 3.5 - 5.1 mmol/L 4.9 4.6 4.7  Chloride 98 -  111 mmol/L 98 99 100  CO2 22 - 32 mmol/L '22 24 22  '$ Calcium 8.9 - 10.3 mg/dL 8.1(L) 8.2(L) 8.2(L)  Total Protein 6.5 - 8.1 g/dL - - -  Total Bilirubin 0.3 - 1.2 mg/dL - - -  Alkaline Phos 38 - 126 U/L - - -  AST 15 - 41 U/L - - -  ALT 0 - 44 U/L - - -    Imaging: No results found.  Assessment/Plan:   Active Problems:   Goals of care, counseling/discussion   Septic shock (HCC)   Cardiac asystole (HCC)   History of cardioembolic cerebrovascular accident (CVA)   Chronic kidney disease   Urinary retention   Patient Summary: [name] is a 65 year old man followed by [facility] with PMHx significant for mild developmental delay, HTN, HLD, Afib with RVR, tachy-brady syndrome (not a permanent pacemaker candidate), chronic combined systolic/diastolic HF ([id number] EF [id number]), CAD (MI 2016, s/p CABG 2017), CVA (2012), COPD (on 2L HOT), T2DM (c/b diabetic neuropathy, s/p R transmetatarsal amputation), CKD stage III admitted for a syncopal episode vs. episode of unresponsiveness secondary to asystole.   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 --Pending SNF --Tylenol 325-'650mg'$  Q4PRN for pain --Eliquis '5mg'$  BID --Aspirin daily --Carvedilol '25mg'$  BID  --Lasix '80mg'$  BID --Cardiology recommendation appreciated   COVID 19 infection Patient tested positive for COVID on admission 03/23/21; Patient is currently sating well on RA. Patient has mild dry cough   --Monitor for new or worsening symptoms ---Pending SNF; Repeat COVID test 04/02/21   Intertrigo of groin --Nystatin powder --Monitor for improvement   CKD IIIb -At baseline, continue to monitor --Continue home medications    T2DM c/b diabetic neuropathy CBG readings <200, acceptable --SSI,Resistant scale --SEMGLEE 25units  daily   HLD --Atorvastatin '80mg'$  daily   Prior to Admission Living Arrangement: Home/[facility] Anticipated Discharge Location: SNF Barriers to Discharge: Medically stable pending negative COVID test Dispo: Anticipated discharge in approximately 1-2 day(s).     Scarlett Presto, MD Internal Medicine Resident PGY-1 Pager [phone] Please contact the on call pager after 5 pm and on weekends at [phone].

## 2021-04-01 NOTE — Progress Notes (Signed)
Occupational Therapy Treatment Patient Details Name: [name] MRN: [id number] DOB: [birth year] Today's Date: 04/01/2021    History of present illness This 65 y.o. male admitted 8/16 after OOH cardiac arrest with continued episodes of unresponsiveness/asystole requiring brief CPR vs stimulations. PPM 8/19 due to heart block. covid (+). PMH includes: developmental delay, benign abdominal tumor, tachy-brady syndrome, acute hypoxic respiratory failure, paroxysmal A_fib, sepsis, CAP, DM, CKD stage IV, COPD, HTN, bil. carotid stenosis, h/o CVAs, s/p Rt transmet amputation, ventral hernia   OT comments  Patient supine in bed and agreeable to OT/PT session with maximal encouragement. Patient completing transfers with min guard, mobility in room with up to min assist +2 for safety, and LB ADLs with total assist.  Attempted standing grooming but requires to sit due to decreased tolerance and impaired balance without BUE support.Exercises to L UE, encouraged ROM due to edema. Will follow acutely.   BP supine: 86/67 HR 85       Seated 94/76 HR 97       Post walking 107/72       End of session 89/55  Max HR 104, SpO2 100% RA    Follow Up Recommendations  SNF;Supervision/Assistance - 24 hour    Equipment Recommendations  None recommended by OT    Recommendations for Other Services      Precautions / Restrictions Precautions Precautions: Fall;ICD/Pacemaker Precaution Comments: Large R hernia, Covid Restrictions Other Position/Activity Restrictions: PPM precautions L UE       Mobility Bed Mobility Overal bed mobility: Needs Assistance Bed Mobility: Supine to Sit     Supine to sit: Min assist     General bed mobility comments: for trunk support and scooting forward, increased time and effort    Transfers Overall transfer level: Needs assistance Equipment used: Rolling walker (2 wheeled) Transfers: Sit to/from [facility] Sit to Stand: Min guard          General transfer comment: min guard to steady, cueing for hand placement and encouragement required    Balance Overall balance assessment: Needs assistance Sitting-balance support: Feet supported;Bilateral upper extremity supported Sitting balance-Leahy Scale: Fair Sitting balance - Comments: limited dynamically, min guard st atically   Standing balance support: Bilateral upper extremity supported;During functional activity Standing balance-Leahy Scale: Poor Standing balance comment: Requires BUE on RW                           ADL either performed or assessed with clinical judgement   ADL Overall ADL's : Needs assistance/impaired     Grooming: Set up;Sitting;Wash/dry hands;Wash/dry face Grooming Details (indicate cue type and reason): attempted standing but poor tolerance and balance without B UE support             Lower Body Dressing: Total assistance;Sit to/from stand Lower Body Dressing Details (indicate cue type and reason): assist for socks, min guard sit to stand but relies on BUE support Toilet Transfer: Minimal assistance;Ambulation;RW Toilet Transfer Details (indicate cue type and reason): simulated in room         Functional mobility during ADLs: Minimal assistance;Rolling walker;+2 for safety/equipment       Vision       Perception     Praxis      Cognition Arousal/Alertness: Awake/alert Behavior During Therapy: WFL for tasks assessed/performed Overall Cognitive Status: History of cognitive impairments - at baseline  General Comments: improving recall of hand placement during transfers and adherence to PPM precatuions        Exercises Exercises: Other exercises Other Exercises Other Exercises: L UE exercises x 10 reps: hand flexion/extension, elbow flexion/extension, x 5 shoulder abudction and flexion to 45*   Shoulder Instructions       General Comments BP soft during session see  clinical assessment for details    Pertinent Vitals/ Pain       Pain Assessment: Faces Faces Pain Scale: Hurts little more Pain Location: L shoulder Pain Descriptors / Indicators: Discomfort;Sore Pain Intervention(s): Limited activity within patient's tolerance;Monitored during session;Repositioned  Home Living                                          Prior Functioning/Environment              Frequency           Progress Toward Goals  OT Goals(current goals can now be found in the care plan section)  Progress towards OT goals: Progressing toward goals  Acute Rehab OT Goals Patient Stated Goal: get better OT Goal Formulation: With patient  Plan Discharge plan remains appropriate;Frequency remains appropriate    Co-evaluation    PT/OT/SLP Co-Evaluation/Treatment: Yes Reason for Co-Treatment: Other (comment);To address functional/ADL transfers (activity tolerance)   OT goals addressed during session: ADL's and self-care;Strengthening/ROM      AM-PAC OT "6 Clicks" Daily Activity     Outcome Measure   Help from another person eating meals?: A Little Help from another person taking care of personal grooming?: A Little Help from another person toileting, which includes using toliet, bedpan, or urinal?: A Lot Help from another person bathing (including washing, rinsing, drying)?: A Lot Help from another person to put on and taking off regular upper body clothing?: A Little Help from another person to put on and taking off regular lower body clothing?: A Lot 6 Click Score: 15    End of Session Equipment Utilized During Treatment: Rolling walker  OT Visit Diagnosis: Unsteadiness on feet (R26.81);Cognitive communication deficit (R41.841);Pain Pain - Right/Left: Left Pain - part of body: Shoulder   Activity Tolerance Patient tolerated treatment well   Patient Left in chair;with call bell/phone within reach;with chair alarm set   Nurse  Communication Mobility status        Time: [id number] OT Time Calculation (min): 34 min  Charges: OT General Charges $OT Visit: 1 Visit OT Treatments $Self Care/Home Management : 8-22 mins  Jolaine Artist, OT Acute Rehabilitation Services Pager [phone] Office [phone]    [name] 04/01/2021, 2:00 PM

## 2021-04-01 NOTE — Progress Notes (Signed)
SLP Cancellation Note  Patient Details Name: SABIR SPORTSMAN MRN: [id number] DOB: 1956/06/30   Cancelled treatment:       Reason Eval/Treat Not Completed: Patient declined, no reason specified (Pt refused MBS despite education from Therapist, sports and encouragement from transport and Therapist, sports. SLP will follow up on subsequent date.)  Sapna Padron I. Hardin Negus, [place], [place] Office number [phone] Pager [place] 04/01/2021, 4:58 PM

## 2021-04-01 NOTE — Care Management Important Message (Signed)
Important Message  Patient Details  Name: [name] MRN: [id number] Date of Birth: 03-23-1956   Medicare Important Message Given:  Yes     Shelda Altes 04/01/2021, 9:22 AM

## 2021-04-01 NOTE — Progress Notes (Signed)
Physical Therapy Treatment Patient Details Name: [name] MRN: [id number] DOB: [birth year] Today's Date: 04/01/2021    History of Present Illness Pt is a 65 y.o. male admitted 03/23/21 after cardiac arrest with continued episodes of unresponsiveness and aystole requiring brief CPR vs. stimulations. CXR 8/17 with RLL consolidation suspicious for infection vs aspiration. Pt with heart block s/p pacemaker insertion 8/19. Pt also tested (+) COVID-19. PMH includes developmental delay, tachy-brady syndrome, benign abdominal tumor, PAF, DM, CKD IV, COPD, HTN, CVAs, R transmet amputation.   PT Comments    Pt progressing with mobility. Today's session focused on transfer and gait training with RW; pt requiring frequent verbal cues for sequencing and precautions, as well as minA for standing and balance. Pt remains limited by generalized weakness, decreased activity tolerance, and impaired balance strategies/postural reactions. Continue to recommend SNF-level therapies to maximize functional mobility and independence prior to return home.  Orthostatic BPs Supine 86/67, HR 85  Sitting 94/76, HR 97  Post-ambulation 8' 107/72  Post-ambulation 16' 89/55, HR 104     Follow Up Recommendations  SNF;Supervision/Assistance - 24 hour     Equipment Recommendations  None recommended by PT    Recommendations for Other Services       Precautions / Restrictions Precautions Precautions: Fall;ICD/Pacemaker Precaution Comments: Large R hernia, Covid Restrictions Other Position/Activity Restrictions: PPM precautions L UE    Mobility  Bed Mobility Overal bed mobility: Needs Assistance Bed Mobility: Supine to Sit     Supine to sit: Min assist;HOB elevated     General bed mobility comments: Verbal cues for sequencing, reliant on use of bed rail, minA for trunk elevation; increased time and effort    Transfers Overall transfer level: Needs assistance Equipment used: Rolling walker (2  wheeled) Transfers: Sit to/from Stand Sit to Stand: Min assist;Min guard         General transfer comment: Multiple sit<>stands from EOB and chair with armrests, pt requires minA for trunk elevation and stability; repeated verbal cues for sequencing and hand placement  Ambulation/Gait Ambulation/Gait assistance: Min assist;+2 safety/equipment;Min guard Gait Distance (Feet): 8 Feet (+ 16') Assistive device: Rolling walker (2 wheeled) Gait Pattern/deviations: Step-to pattern;Trunk flexed Gait velocity: Decreased Gait velocity interpretation: <1.31 ft/sec, indicative of household ambulator General Gait Details: Slow, labored gait with RW and intermittent minA for stability and RW navigation; pt quick to fatigue/want a seated rest break, able to walk 8' then additional 16'; further distance limited by fatigue   Stairs             Wheelchair Mobility    Modified Rankin (Stroke Patients Only)       Balance Overall balance assessment: Needs assistance Sitting-balance support: Feet supported;Single extremity supported Sitting balance-Leahy Scale: Fair Sitting balance - Comments: limited dynamically, min guard st atically   Standing balance support: Bilateral upper extremity supported;During functional activity Standing balance-Leahy Scale: Poor Standing balance comment: Reliant on UE support; leaning against sink to maintain balance static standing                            Cognition Arousal/Alertness: Awake/alert Behavior During Therapy: WFL for tasks assessed/performed;Flat affect Overall Cognitive Status: History of cognitive impairments - at baseline                                 General Comments: Pt with h/o developmental delay. Pt with improving recall of  hand placement during transfers and adherence to pacemaker precautions. Follows majority of simple commands well. Requires max encouragement to complete tasks as independently as possible;  pt reports, "I'm lazy" but ultimately pleasant and agreeable to participate      Exercises Other Exercises Other Exercises: L UE exercises x 10 reps: hand flexion/extension, elbow flexion/extension, x 5 shoulder abudction and flexion to 45*    General Comments General comments (skin integrity, edema, etc.): Soft BP during session      Pertinent Vitals/Pain Pain Assessment: Faces Faces Pain Scale: Hurts little more Pain Location: L shoulder/UE Pain Descriptors / Indicators: Discomfort;Sore Pain Intervention(s): Monitored during session;Limited activity within patient's tolerance    Home Living                      Prior Function            PT Goals (current goals can now be found in the care plan section) Acute Rehab PT Goals Patient Stated Goal: get better Progress towards PT goals: Progressing toward goals    Frequency    Min 2X/week      PT Plan Current plan remains appropriate;Frequency needs to be updated    Co-evaluation   Reason for Co-Treatment: Other (comment);To address functional/ADL transfers (activity tolerance)   OT goals addressed during session: ADL's and self-care;Strengthening/ROM      AM-PAC PT "6 Clicks" Mobility   Outcome Measure  Help needed turning from your back to your side while in a flat bed without using bedrails?: A Little Help needed moving from lying on your back to sitting on the side of a flat bed without using bedrails?: A Lot Help needed moving to and from a bed to a chair (including a wheelchair)?: A Little Help needed standing up from a chair using your arms (e.g., wheelchair or bedside chair)?: A Little Help needed to walk in hospital room?: A Little Help needed climbing 3-5 steps with a railing? : A Lot 6 Click Score: 16    End of Session   Activity Tolerance: Patient tolerated treatment well;Patient limited by fatigue Patient left: in chair;with call bell/phone within reach;with chair alarm set Nurse  Communication: Mobility status PT Visit Diagnosis: Unsteadiness on feet (R26.81);Muscle weakness (generalized) (M62.81)     Time: [id number] PT Time Calculation (min) (ACUTE ONLY): 34 min  Charges:  $Therapeutic Activity: 8-22 mins                     Mabeline Caras, PT, DPT Acute Rehabilitation Services  Pager [phone] Office [place] 04/01/2021, 2:52 PM

## 2021-04-01 NOTE — Progress Notes (Signed)
Nutrition Brief Note  Patient identified on the Malnutrition Screening Tool (MST) Report  Wt Readings from Last 15 Encounters:  04/01/21 93.1 kg  09/23/20 80.7 kg  03/27/20 88 kg  03/04/20 86.9 kg  06/14/19 85.3 kg  06/11/19 86.2 kg  05/24/19 88 kg  04/25/19 88 kg  04/04/19 87.8 kg  03/13/19 88 kg  01/24/19 81.6 kg  01/17/19 90.3 kg  08/16/18 82.6 kg  08/09/18 82.6 kg  05/03/18 68.9 kg   [name] is a 65 year old man followed by [facility] with PMHx significant for mild developmental delay, HTN, HLD, Afib with RVR, tachy-brady syndrome (not a permanent pacemaker candidate), chronic combined systolic/diastolic HF ([id number]), CAD (MI 2016, s/p CABG 2017), CVA (2012), COPD (on 2L HOT), T2DM (c/b diabetic neuropathy, s/p R transmetatarsal amputation), CKD stage III admitted for a syncopal episode vs. episode of unresponsiveness secondary to asystole.  Spoke with pt, who reports great appetite. He has been consuming most of his meals since admission and tolerating them without difficulty. PTA he reports having a good appetite and consuming 3 meals per day.   Pt denies any weight loss.   Nutrition-Focused physical exam completed. Findings are no fat depletion, no muscle depletion, and mild edema.    Current diet order is dysphagia 3, patient is consuming approximately 100% of meals at this time. Labs and medications reviewed.   No nutrition interventions warranted at this time. If nutrition issues arise, please consult RD.   Loistine Chance, RD, LDN, [place] Registered Dietitian II Certified Diabetes Care and Education Specialist Please refer to [facility] for RD and/or RD on-call/weekend/after hours pager

## 2021-04-02 DIAGNOSIS — U071 COVID-19: Secondary | ICD-10-CM | POA: Diagnosis not present

## 2021-04-02 DIAGNOSIS — I469 Cardiac arrest, cause unspecified: Secondary | ICD-10-CM | POA: Diagnosis not present

## 2021-04-02 DIAGNOSIS — E1165 Type 2 diabetes mellitus with hyperglycemia: Secondary | ICD-10-CM | POA: Diagnosis not present

## 2021-04-02 LAB — BASIC METABOLIC PANEL
Anion gap: 8 (ref 5–15)
BUN: 103 mg/dL — ABNORMAL HIGH (ref 8–23)
CO2: 24 mmol/L (ref 22–32)
Calcium: 8.1 mg/dL — ABNORMAL LOW (ref 8.9–10.3)
Chloride: 98 mmol/L (ref 98–111)
Creatinine, Ser: 1.8 mg/dL — ABNORMAL HIGH (ref 0.61–1.24)
GFR, Estimated: 41 mL/min — ABNORMAL LOW (ref >60)
Glucose, Bld: 84 mg/dL (ref 70–99)
Potassium: 4.6 mmol/L (ref 3.5–5.1)
Sodium: 130 mmol/L — ABNORMAL LOW (ref 135–145)

## 2021-04-02 LAB — GLUCOSE, CAPILLARY
Glucose-Capillary: 71 mg/dL (ref 70–99)
Glucose-Capillary: 67 mg/dL — ABNORMAL LOW (ref 70–99)
Glucose-Capillary: 68 mg/dL — ABNORMAL LOW (ref 70–99)
Glucose-Capillary: 79 mg/dL (ref 70–99)

## 2021-04-02 MED ORDER — FUROSEMIDE 80 MG PO TABS: 80.0000 mg | ORAL_TABLET | Freq: Two times a day (BID) | ORAL | 1 refills | Status: DC

## 2021-04-02 MED ORDER — TAMSULOSIN HCL 0.4 MG PO CAPS: 0.4000 mg | ORAL_CAPSULE | Freq: Every day | ORAL | 0 refills | Status: DC

## 2021-04-02 MED ORDER — CARVEDILOL 12.5 MG PO TABS: 12.5000 mg | ORAL_TABLET | Freq: Two times a day (BID) | ORAL | 0 refills | Status: DC

## 2021-04-02 MED ORDER — METFORMIN HCL 500 MG PO TABS: 500.0000 mg | ORAL_TABLET | Freq: Every day | ORAL | 0 refills | Status: DC

## 2021-04-02 MED ORDER — LOSARTAN POTASSIUM 25 MG PO TABS: 12.5000 mg | ORAL_TABLET | Freq: Every day | ORAL | 2 refills | Status: DC

## 2021-04-02 MED ORDER — APIXABAN 5 MG PO TABS: 5.0000 mg | ORAL_TABLET | Freq: Two times a day (BID) | ORAL | 0 refills | Status: DC

## 2021-04-02 NOTE — TOC Transition Note (Signed)
Transition of Care [facility]) - CM/SW Discharge Note   Patient Details  Name: TARVARES RENS MRN: [id number] Date of Birth: 1956/07/29  [facility] CM/SW Contact:  Tresa Endo Phone Number: 04/02/2021, 12:29 PM   Clinical Narrative:    Patient will DC to: [facility] Anticipated DC date: 04/02/2021 Family notified: Pt sister Transport by: [facility]   Per MD patient ready for DC to [facility]. RN to call report prior to discharge ([phone]). RN, patient, patient's family, and facility notified of DC. Discharge Summary and FL2 sent to facility. DC packet on chart. Ambulance transport requested for patient.   CSW will sign off for now as social work intervention is no longer needed. Please consult [place] again if new needs arise.       Barriers to Discharge: Continued Medical Work up   Patient Goals and CMS Choice Patient states their goals for this hospitalization and ongoing recovery are:: to go to SNF CMS Medicare.gov Compare Post Acute Care list provided to:: Patient Choice offered to / list presented to : Patient  Discharge Placement                       Discharge Plan and Services In-house Referral: Clinical Social Work                                   Social Determinants of Health ([place]) Interventions     Readmission Risk Interventions No flowsheet data found.

## 2021-04-02 NOTE — Progress Notes (Signed)
Report called to Gillermina Phy at [facility] facility.

## 2021-04-02 NOTE — Discharge Summary (Signed)
Name: [name] MRN: [id number] DOB: September 06, 1955 65 y.o. PCP: Joseph Adie, MD  Date of Admission: 03/23/2021 10:07 AM Date of Discharge: 04/02/21 Attending Physician: Joseph Groves, DO  Discharge Diagnosis: 1. 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 Urinary Retention 3. COVID 19 infection 4. Intertrigo of groin 5. T2DM c/b diabetic neuropathy Discharge Medications: Allergies as of 04/02/2021       Reactions   Penicillins Hives, Nausea And Vomiting, Swelling, Other (See Comments)   Tolerated Cefepime Has patient had a PCN reaction causing immediate rash, facial/tongue/throat swelling, SOB or lightheadedness with hypotension: YES Has patient had a PCN reaction causing severe rash involving mucus membranes or skin necrosis: No Has patient had a PCN reaction that required hospitalization No Has patient had a PCN reaction occurring within the last 10 years: No If all of the above answers are "NO", then may proceed with Cephalosporin use.        Medication List     STOP taking these medications    amiodarone 200 MG tablet Commonly known as: PACERONE   HYDROcodone-acetaminophen 5-325 MG tablet Commonly known as: NORCO/VICODIN   metoprolol succinate 25 MG 24 hr tablet Commonly known as: TOPROL-XL   predniSONE 10 MG tablet Commonly known as: DELTASONE   risperiDONE 0.5 MG tablet Commonly known as: RISPERDAL   traMADol 50 MG tablet Commonly known as: Ultram       TAKE these medications    acetaminophen 325 MG tablet Commonly known as: TYLENOL Take 650 mg by mouth 3 (three) times daily as needed for mild pain.   apixaban 5 MG Tabs tablet Commonly known as: ELIQUIS Take 1 tablet (5 mg total) by mouth 2 (two) times daily. What changed:  medication strength how much to take   aspirin 81 MG EC tablet Take 81 mg by mouth daily.   atorvastatin 80 MG  tablet Commonly known as: LIPITOR Take 1 tablet (80 mg total) by mouth daily.   calcium carbonate 500 MG chewable tablet Commonly known as: TUMS - dosed in mg elemental calcium Chew 1 tablet by mouth 3 (three) times daily as needed for indigestion or heartburn.   carvedilol 12.5 MG tablet Commonly known as: COREG Take 1 tablet (12.5 mg total) by mouth 2 (two) times daily with a meal.   Darbepoetin Alfa 150 MCG/0.3ML Sosy injection Commonly known as: ARANESP Inject 0.3 mLs (150 mcg total) into the skin every Wednesday at 6 PM. What changed:  when to take this additional instructions   furosemide 80 MG tablet Commonly known as: LASIX Take 1 tablet (80 mg total) by mouth 2 (two) times daily as needed for fluid.   insulin glargine 100 UNIT/ML injection Commonly known as: LANTUS Inject 0.14 mLs (14 Units total) into the skin at bedtime. What changed:  how much to take when to take this   ipratropium-albuterol 0.5-2.5 (3) MG/3ML Soln Commonly known as: DUONEB Take 3 mLs by nebulization 2 (two) times daily as needed (COPD).   losartan 25 MG tablet Commonly known as: COZAAR Take 0.5 tablets (12.5 mg total) by mouth daily. Start taking on: April 03, 2021   metFORMIN 500 MG tablet Commonly known as: GLUCOPHAGE Take 1 tablet (500 mg total) by mouth daily with breakfast. Start taking on: April 03, 2021   multivitamin capsule Take 1 capsule by mouth daily.   multivitamin Tabs tablet Take 1 tablet by mouth at bedtime.  nitroGLYCERIN 0.4 MG SL tablet Commonly known as: NITROSTAT Place 0.4 mg under the tongue every 5 (five) minutes as needed for chest pain.   nystatin powder Commonly known as: MYCOSTATIN/NYSTOP Apply topically 4 (four) times daily. Apply to inguinal area/scrotum and under pannus What changed:  how much to take when to take this additional instructions   OXYGEN Inhale 2.5 L into the lungs See admin instructions. As needed and at bedtime for shortness  of breath   pantoprazole 40 MG tablet Commonly known as: PROTONIX Take 40 mg by mouth daily.   PARoxetine 40 MG tablet Commonly known as: PAXIL Take 40 mg by mouth daily.   polyvinyl alcohol 1.4 % ophthalmic solution Commonly known as: LIQUIFILM TEARS Place 1 drop into both eyes 2 (two) times daily as needed for dry eyes. Natural Tears   senna-docusate 8.6-50 MG tablet Commonly known as: Senokot-S Take 1 tablet by mouth at bedtime as needed for mild constipation.   tamsulosin 0.4 MG Caps capsule Commonly known as: FLOMAX Take 1 capsule (0.4 mg total) by mouth daily. Start taking on: April 03, 2021   tiotropium 18 MCG inhalation capsule Commonly known as: SPIRIVA Place 18 mcg into inhaler and inhale daily.   triamcinolone cream 0.1 % Commonly known as: KENALOG Apply 1 application topically 2 (two) times daily as needed (legs and other itchy areas).   Vitamin D3 50 MCG (2000 UT) Tabs Take 50 mcg by mouth daily.               Discharge Care Instructions  (From admission, onward)           Start     Ordered   04/02/21 0000  Discharge wound care:       Comments: Wash the open wounds on the abdomen with soap and water, pat dry.  Place a size appropriate piece of Xeroform over the area then a foam dressing.  Change the Xeroform daily. The forms can stay in place up to 3 days.   04/02/21 1139            Disposition and follow-up:   Mr.[name] was discharged from [facility] in Stable condition.  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 Urinary Retention- Foley removal by urologist at [facility] Intertrigo of groin- continue nystatin  powder T2DM c/b diabetic neuropathy- continue home dose LANTUS 25 units daily; patient was started on metformin 500 mg daily  Follow up with PCP  2.  Labs / imaging needed at time of follow-up: CXR in 4-6 weeks  3.  Pending labs/ test needing follow-up: None   Follow-up Appointments:  Follow-up Information     [place] [facility] Follow up.   Specialty: Cardiology Why: 04/08/21 @ 4:00PM, wound check visit Contact information: [address], [address] [place]        Evans Lance, MD Follow up.   Specialty: Cardiology Why: 07/06/21 @ 4:15PM Contact information: [id number] [place] [address] [phone]         Richardson Dopp T, PA-C Follow up.   Specialties: Cardiology, Physician Assistant Why: 04/14/21 @ 8:45AM, cardiology follow up Contact information: [address]  [address] [place] [address] [phone]         [facility] Follow up.   Why: For urinary retention Contact information: [place]        Joseph Adie, MD Follow up in 1 week(s).   Specialty: Family Medicine Contact information: [place] [address] [id number]         Nahser, Wonda Cheng, MD .   Specialty: Cardiology Contact information: [place] [address] [place] [address] [phone]                 Hospital Course by problem list: 1. 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  Patient also experienced an episode of ST he then VT that resolved spontaneously.  Cardiology was consulted.  Chest x-ray revealed right lower  lobe pneumonia and empiric ceftriaxone and azithromycin was started.  Cardiology deemed patient not a good candidate for PPM and an ICD was placed on 03/26/2021.  Patient was initially on IV amiodarone and later switched to amiodarone 400 mg p.o., Eliquis 5 mg twice daily, aspirin daily, carvedilol 25 mg twice daily, Lasix 80 mg twice daily, losartan 12.5 mg daily.  Due to soft blood pressures patient's carvedilol was reduced to 12.5 twice daily.  Patient has been hemodynamically stable for the past 24 hrs. and will be discharged to SNF. 2. Urinary Retention-patient was placed on Foley during admission.  On 03/31/2021 patient failed voiding trial.  Bladder scan reveals 300 mL.  Patient was started on 0.4 mg Flomax.  Patient currently has Foley and will follow up with urology at discharge. 3. COVID 19 infection-on admission on 03/23/2021 patient was found to be COVID-positive.  On initial presentation patient was asymptomatic.  Patient later developed shortness of breath and dry cough.  Patient receives 7 days of dexamethasone injection and 2 L oxygen supplementation.  Patient symptoms subsided and dexamethasone was discontinued.  Patient is currently satting >94% room air. 4. Intertrigo of groin-patient reported irritation in the groin and was found to have intertrigo of the pannus and groin.  Patient received nystatin powder for treatment. 5. T2DM c/b diabetic neuropathy-since admission patient was placed on sliding scale insulin, resistant scale and Lantus 20 units daily.  On 03/30/2021 labs reveals patient has increased CBG ranging in the 200s.  Lantus was increased to 25 units daily, which is his home dose.  CBGs at goal since then.  Patient was also started on metformin 500 mg daily.   Discharge Exam:   BP 100/72   Pulse 77   Temp 98.6 F (37 C) (Oral)   Resp 20   Ht 5' (1.524 m)   Wt 95.7 kg   SpO2 96%   BMI 41.20 kg/m  Discharge exam: Physical Exam Constitutional:      General: He is awake.   HENT:     Head: Normocephalic and atraumatic.  Cardiovascular:     Rate and Rhythm: Normal rate.  Pulmonary:     Effort: Pulmonary effort is normal.  Abdominal:     General: Abdomen is protuberant.  Skin:    General: Skin is warm and dry.  Neurological:     General: No focal deficit present.     Mental  Status: He is alert. Mental status is at baseline.  Psychiatric:        Speech: Speech normal.        Behavior: Behavior normal. Behavior is cooperative.     Pertinent Labs, Studies, and Procedures:  CBC Latest Ref Rng & Units 03/30/2021 03/28/2021 03/26/2021  WBC 4.0 - 10.5 K/uL 8.7 10.0 12.6(H)  Hemoglobin 13.0 - 17.0 g/dL 10.8(L) 10.6(L) 9.9(L)  Hematocrit 39.0 - 52.0 % 33.7(L) 32.9(L) 31.8(L)  Platelets 150 - 400 K/uL 213 229 184   CMP Latest Ref Rng & Units 04/02/2021 04/01/2021 03/31/2021  Glucose 70 - 99 mg/dL 84 84 140(H)  BUN 8 - 23 mg/dL 103(H) 100(H) 88(H)  Creatinine 0.61 - 1.24 mg/dL 1.80(H) 1.87(H) 1.91(H)  Sodium 135 - 145 mmol/L 130(L) 129(L) 129(L)  Potassium 3.5 - 5.1 mmol/L 4.6 4.9 4.6  Chloride 98 - 111 mmol/L 98 98 99  CO2 22 - 32 mmol/L '24 22 24  '$ Calcium 8.9 - 10.3 mg/dL 8.1(L) 8.1(L) 8.2(L)  Total Protein 6.5 - 8.1 g/dL - - -  Total Bilirubin 0.3 - 1.2 mg/dL - - -  Alkaline Phos 38 - 126 U/L - - -  AST 15 - 41 U/L - - -  ALT 0 - 44 U/L - - -   BMP Latest Ref Rng & Units 04/02/2021 04/01/2021 03/31/2021  Glucose 70 - 99 mg/dL 84 84 140(H)  BUN 8 - 23 mg/dL 103(H) 100(H) 88(H)  Creatinine 0.61 - 1.24 mg/dL 1.80(H) 1.87(H) 1.91(H)  BUN/Creat Ratio 10 - 24 - - -  Sodium 135 - 145 mmol/L 130(L) 129(L) 129(L)  Potassium 3.5 - 5.1 mmol/L 4.6 4.9 4.6  Chloride 98 - 111 mmol/L 98 98 99  CO2 22 - 32 mmol/L '24 22 24  '$ Calcium 8.9 - 10.3 mg/dL 8.1(L) 8.1(L) 8.2(L)   Echocardiogram:  1. Left ventricular ejection fraction, by estimation, is 20%. The left  ventricle demonstrates global hypokinesis. The left ventricular internal  cavity size was mildly dilated.  Left ventricular diastolic parameters are  indeterminate.   2. Right ventricular systolic function is moderately reduced. The right  ventricular size is mildly enlarged. Tricuspid regurgitation signal is  inadequate for assessing PA pressure.   3. Left atrial size was mildly dilated.   4. Doppler not assessed. No evidence of mitral stenosis. Moderate mitral  annular calcification.   5. Doppler not assessed. The aortic valve is tricuspid.   6. The inferior vena cava is normal in size with <50% respiratory  variability, suggesting right atrial pressure of 8 mmHg.   7. Limited echo. The patient was tachycardic in atrial fibrillation, rate  in 130s generally.   FINDINGS   Left Ventricle: Left ventricular ejection fraction, by estimation, is  20%. The left ventricle demonstrates global hypokinesis. The left  ventricular internal cavity size was mildly dilated. There is no left  ventricular hypertrophy. Left ventricular  diastolic parameters are indeterminate.   Right Ventricle: The right ventricular size is mildly enlarged. Right  ventricular systolic function is moderately reduced. Tricuspid  regurgitation signal is inadequate for assessing PA pressure.   Left Atrium: Left atrial size was mildly dilated.   Right Atrium: Right atrial size was normal in size.   Mitral Valve: Doppler not assessed. There is mild calcification of the  mitral valve leaflet(s). Moderate mitral annular calcification. No  evidence of mitral valve stenosis.   Tricuspid Valve: The tricuspid valve is not assessed.   Aortic Valve: Doppler not assessed. The aortic valve is tricuspid.  Venous: The inferior vena cava is normal in size with less than 50%  respiratory variability, suggesting right atrial pressure of 8 mmHg.   CXR 03/22/21:  CHEST - 2 VIEW   COMPARISON:  Radiograph 06/30/2020.   FINDINGS: Similar low lung volumes. Patient is post median sternotomy. Similar cardiomegaly with aortic  atherosclerosis and tortuosity there is patchy airspace disease at the right lung base, new from prior exam. Background peribronchial and interstitial thickening is similar. Minor subsegmental atelectasis at the left lung base. Suspected small right pleural effusion. No pneumothorax. Scoliotic curvature of the spine.   IMPRESSION: 1. Patchy airspace disease at the right lung base, suspicious for pneumonia. Followup PA and lateral chest X-ray is recommended in 3-4 weeks following trial of antibiotic therapy to ensure resolution and exclude underlying malignancy. 2. Suspected small right pleural effusion. 3. Stable cardiomegaly. Stable mild diffuse interstitial and bronchial thickening suggesting chronic lung disease, possible COPD.  CXR 03/27/21:  FINDINGS: Post median sternotomy as on the previous study for CABG.   EKG leads project over the patient. A lead or wire of some sort appears to be posterior and exterior to the patient projecting over the RIGHT upper chest passing from LEFT to RIGHT across the chest.   A multi lead (3) pacer defibrillator is in place, lateral view limited by arm position. Leads project over the cardiac silhouette on the frontal view. Power pack over the LEFT upper chest. Small to moderate RIGHT and small LEFT pleural effusion. Basilar airspace disease slightly greater on the RIGHT than the LEFT.   No visible pneumothorax.   On limited assessment no acute skeletal process.   IMPRESSION: 1. Post median sternotomy with pacer defibrillator placement as described. No visible pneumothorax. 2. Small LEFT and small to moderate RIGHT pleural effusion. Associated basilar airspace disease likely atelectatic changes.    Discharge Instructions: Discharge Instructions     Call MD for:  difficulty breathing, headache or visual disturbances   Complete by: As directed    Call MD for:  extreme fatigue   Complete by: As directed    Call MD for:  hives   Complete  by: As directed    Call MD for:  persistant dizziness or light-headedness   Complete by: As directed    Call MD for:  persistant nausea and vomiting   Complete by: As directed    Call MD for:  redness, tenderness, or signs of infection (pain, swelling, redness, odor or green/yellow discharge around incision site)   Complete by: As directed    Call MD for:  severe uncontrolled pain   Complete by: As directed    Call MD for:  temperature >100.4   Complete by: As directed    Diet - low sodium heart healthy   Complete by: As directed    Discharge wound care:   Complete by: As directed    Wash the open wounds on the abdomen with soap and water, pat dry.  Place a size appropriate piece of Xeroform over the area then a foam dressing.  Change the Xeroform daily. The forms can stay in place up to 3 days.   Increase activity slowly   Complete by: As directed        Signed: Timothy Lasso, MD 04/02/2021, 11:40 AM   Pager: [phone] voiding trial

## 2021-04-02 NOTE — Progress Notes (Signed)
Pt noted to be hypoglycemic, blood sugar noted to be 68, pt given 2 sodas per standing orders. Blood sugar rechecked and noted to be 68 still. Pt currently eating lunch at this time. RN to recheck blood sugar after lunch per MD Joni Reining, clarified with MD, no need for D5 at this time. Will continue to monitor.

## 2021-04-02 NOTE — Progress Notes (Signed)
  Speech Language Pathology Treatment: Dysphagia  Patient Details Name: [name] MRN: [id number] DOB: 02/24/1956 Today's Date: 04/02/2021 Time: 0712-1975 SLP Time Calculation (min) (ACUTE ONLY): 13 min  Assessment / Plan / Recommendation Clinical Impression  Pt was seen for dysphagia treatment and he was cooperative throughout the session. Pt, and Tnai, RN that the pt has been tolerating the current diet and meds whole without overt s/sx of aspiration. Pt stated that he has been remembering to slow down and has been tolerating meals without difficulty. He further stated that he has preferred the dysphagia 3 diet and that he has been tolerating this with notably less difficulty compared to the regular textures. Pt tolerated puree solids, dysphagia 3 texture solids, and thin liquids via straw without overt s/sx of aspiration. Mastication was [facility] without symptoms of oropharyngeal dysphagia. It is recommended that the current diet be continued. The MBS does not appear to be clinically indicated at this time and the pt currently has discharge orders. The study may be completed as an outpatient if it subsequently appears to be warranted. Further skilled SLP services are not clinically indicated at this time.    HPI HPI: Pt is a 65 y.o. male who presented on 8/16 with report of episodes of syncope and unresponsiveness. In the ED, he has recurrent asystolic events with rounds of CPR. PMT 8/17: continue full scope with watchful waiting. CXR 8/17: Right lower lobe consolidation which is suspicious for infection or  aspiration. PMH: mild developmental delay, HTN, HLD, Afib with RVR, tachy-brady syndrome (not a permanent pacemaker candidate), chronic combined systolic/diastolic HF (03/8324 EF 49-82%), CAD (MI 2016, s/p CABG 2017), CVA (2012), T2DM (c/b diabetic neuropathy, s/p R transmetatarsal amputation), CKD stage III. MBS 10/13/17: laryngeal penetation limited to flash with thin and contacting cords with  thin via straw and exited vestibule. Timing of swallow functional with mildly incomplete epiglottic closure. Dysphagia 3 and thin liquids recommended      SLP Plan  All goals met;Discharge SLP treatment due to (comment)       Recommendations  Diet recommendations: Dysphagia 3 (mechanical soft);Thin liquid Liquids provided via: Cup;Straw Medication Administration: Whole meds with puree Supervision: Patient able to self feed Compensations: Slow rate;Small sips/bites Postural Changes and/or Swallow Maneuvers: Seated upright 90 degrees                Oral Care Recommendations: Oral care BID;Patient independent with oral care Follow up Recommendations: Skilled Nursing facility SLP Visit Diagnosis: Dysphagia, unspecified (R13.10) Plan: All goals met;Discharge SLP treatment due to (comment)       Decorey Wahlert I. Hardin Negus, [place], [place] Office number [phone] Pager [place] 04/02/2021, 12:09 PM    '

## 2021-04-04 ENCOUNTER — Emergency Department (HOSPITAL_COMMUNITY): Payer: Medicare (Managed Care)

## 2021-04-04 ENCOUNTER — Inpatient Hospital Stay (HOSPITAL_COMMUNITY)
Admission: EM | Admit: 2021-04-04 | Discharge: 2021-04-14 | DRG: 291 | Disposition: A | Discharge status: Hospice | Payer: Medicare (Managed Care) | Attending: Student in an Organized Health Care Education/Training Program | Admitting: Student in an Organized Health Care Education/Training Program

## 2021-04-04 DIAGNOSIS — Z7982 Long term (current) use of aspirin: Secondary | ICD-10-CM

## 2021-04-04 DIAGNOSIS — E669 Obesity, unspecified: Secondary | ICD-10-CM | POA: Diagnosis present

## 2021-04-04 DIAGNOSIS — Z8674 Personal history of sudden cardiac arrest: Secondary | ICD-10-CM

## 2021-04-04 DIAGNOSIS — Z8616 Personal history of COVID-19: Secondary | ICD-10-CM

## 2021-04-04 DIAGNOSIS — I509 Heart failure, unspecified: Secondary | ICD-10-CM

## 2021-04-04 DIAGNOSIS — E871 Hypo-osmolality and hyponatremia: Secondary | ICD-10-CM | POA: Diagnosis present

## 2021-04-04 DIAGNOSIS — D500 Iron deficiency anemia secondary to blood loss (chronic): Secondary | ICD-10-CM | POA: Diagnosis present

## 2021-04-04 DIAGNOSIS — Z950 Presence of cardiac pacemaker: Secondary | ICD-10-CM | POA: Diagnosis present

## 2021-04-04 DIAGNOSIS — I130 Hypertensive heart and chronic kidney disease with heart failure and stage 1 through stage 4 chronic kidney disease, or unspecified chronic kidney disease: Principal | ICD-10-CM | POA: Diagnosis present

## 2021-04-04 DIAGNOSIS — Z7984 Long term (current) use of oral hypoglycemic drugs: Secondary | ICD-10-CM

## 2021-04-04 DIAGNOSIS — Z9981 Dependence on supplemental oxygen: Secondary | ICD-10-CM

## 2021-04-04 DIAGNOSIS — R339 Retention of urine, unspecified: Secondary | ICD-10-CM | POA: Diagnosis present

## 2021-04-04 DIAGNOSIS — F79 Unspecified intellectual disabilities: Secondary | ICD-10-CM | POA: Diagnosis present

## 2021-04-04 DIAGNOSIS — I252 Old myocardial infarction: Secondary | ICD-10-CM

## 2021-04-04 DIAGNOSIS — Z794 Long term (current) use of insulin: Secondary | ICD-10-CM

## 2021-04-04 DIAGNOSIS — R079 Chest pain, unspecified: Secondary | ICD-10-CM | POA: Diagnosis not present

## 2021-04-04 DIAGNOSIS — E785 Hyperlipidemia, unspecified: Secondary | ICD-10-CM | POA: Diagnosis present

## 2021-04-04 DIAGNOSIS — E1122 Type 2 diabetes mellitus with diabetic chronic kidney disease: Secondary | ICD-10-CM | POA: Diagnosis present

## 2021-04-04 DIAGNOSIS — Z6838 Body mass index (BMI) 38.0-38.9, adult: Secondary | ICD-10-CM

## 2021-04-04 DIAGNOSIS — I472 Ventricular tachycardia: Secondary | ICD-10-CM | POA: Diagnosis not present

## 2021-04-04 DIAGNOSIS — IMO0002 Reserved for concepts with insufficient information to code with codable children: Secondary | ICD-10-CM | POA: Diagnosis present

## 2021-04-04 DIAGNOSIS — I255 Ischemic cardiomyopathy: Secondary | ICD-10-CM | POA: Diagnosis present

## 2021-04-04 DIAGNOSIS — Z66 Do not resuscitate: Secondary | ICD-10-CM | POA: Diagnosis not present

## 2021-04-04 DIAGNOSIS — J9601 Acute respiratory failure with hypoxia: Secondary | ICD-10-CM | POA: Diagnosis not present

## 2021-04-04 DIAGNOSIS — N1832 Chronic kidney disease, stage 3b: Secondary | ICD-10-CM | POA: Diagnosis present

## 2021-04-04 DIAGNOSIS — I4819 Other persistent atrial fibrillation: Secondary | ICD-10-CM | POA: Diagnosis not present

## 2021-04-04 DIAGNOSIS — J440 Chronic obstructive pulmonary disease with acute lower respiratory infection: Secondary | ICD-10-CM | POA: Diagnosis present

## 2021-04-04 DIAGNOSIS — E1165 Type 2 diabetes mellitus with hyperglycemia: Secondary | ICD-10-CM | POA: Diagnosis present

## 2021-04-04 DIAGNOSIS — D692 Other nonthrombocytopenic purpura: Secondary | ICD-10-CM | POA: Diagnosis not present

## 2021-04-04 DIAGNOSIS — I5043 Acute on chronic combined systolic (congestive) and diastolic (congestive) heart failure: Secondary | ICD-10-CM | POA: Diagnosis present

## 2021-04-04 DIAGNOSIS — Z87891 Personal history of nicotine dependence: Secondary | ICD-10-CM

## 2021-04-04 DIAGNOSIS — E1142 Type 2 diabetes mellitus with diabetic polyneuropathy: Secondary | ICD-10-CM | POA: Diagnosis present

## 2021-04-04 DIAGNOSIS — Z880 Allergy status to penicillin: Secondary | ICD-10-CM

## 2021-04-04 DIAGNOSIS — I5084 End stage heart failure: Secondary | ICD-10-CM | POA: Diagnosis present

## 2021-04-04 DIAGNOSIS — F419 Anxiety disorder, unspecified: Secondary | ICD-10-CM | POA: Diagnosis not present

## 2021-04-04 DIAGNOSIS — J449 Chronic obstructive pulmonary disease, unspecified: Secondary | ICD-10-CM | POA: Diagnosis present

## 2021-04-04 DIAGNOSIS — E875 Hyperkalemia: Secondary | ICD-10-CM | POA: Diagnosis present

## 2021-04-04 DIAGNOSIS — R319 Hematuria, unspecified: Secondary | ICD-10-CM | POA: Diagnosis not present

## 2021-04-04 DIAGNOSIS — Z8249 Family history of ischemic heart disease and other diseases of the circulatory system: Secondary | ICD-10-CM

## 2021-04-04 DIAGNOSIS — K5900 Constipation, unspecified: Secondary | ICD-10-CM | POA: Diagnosis present

## 2021-04-04 DIAGNOSIS — Z79899 Other long term (current) drug therapy: Secondary | ICD-10-CM

## 2021-04-04 DIAGNOSIS — D638 Anemia in other chronic diseases classified elsewhere: Secondary | ICD-10-CM | POA: Diagnosis present

## 2021-04-04 DIAGNOSIS — Z951 Presence of aortocoronary bypass graft: Secondary | ICD-10-CM

## 2021-04-04 DIAGNOSIS — I2510 Atherosclerotic heart disease of native coronary artery without angina pectoris: Secondary | ICD-10-CM | POA: Diagnosis present

## 2021-04-04 DIAGNOSIS — I480 Paroxysmal atrial fibrillation: Secondary | ICD-10-CM | POA: Diagnosis present

## 2021-04-04 DIAGNOSIS — G8929 Other chronic pain: Secondary | ICD-10-CM | POA: Diagnosis present

## 2021-04-04 DIAGNOSIS — Z8673 Personal history of transient ischemic attack (TIA), and cerebral infarction without residual deficits: Secondary | ICD-10-CM

## 2021-04-04 DIAGNOSIS — K219 Gastro-esophageal reflux disease without esophagitis: Secondary | ICD-10-CM | POA: Diagnosis present

## 2021-04-04 DIAGNOSIS — Z800 Family history of malignant neoplasm of digestive organs: Secondary | ICD-10-CM

## 2021-04-04 DIAGNOSIS — J189 Pneumonia, unspecified organism: Secondary | ICD-10-CM | POA: Diagnosis present

## 2021-04-04 DIAGNOSIS — Z7901 Long term (current) use of anticoagulants: Secondary | ICD-10-CM

## 2021-04-04 DIAGNOSIS — I495 Sick sinus syndrome: Secondary | ICD-10-CM | POA: Diagnosis present

## 2021-04-04 DIAGNOSIS — R778 Other specified abnormalities of plasma proteins: Secondary | ICD-10-CM

## 2021-04-04 DIAGNOSIS — Z95810 Presence of automatic (implantable) cardiac defibrillator: Secondary | ICD-10-CM

## 2021-04-04 DIAGNOSIS — R6250 Unspecified lack of expected normal physiological development in childhood: Secondary | ICD-10-CM | POA: Diagnosis present

## 2021-04-04 DIAGNOSIS — Z7401 Bed confinement status: Secondary | ICD-10-CM

## 2021-04-04 DIAGNOSIS — I452 Bifascicular block: Secondary | ICD-10-CM | POA: Diagnosis present

## 2021-04-04 DIAGNOSIS — Z803 Family history of malignant neoplasm of breast: Secondary | ICD-10-CM

## 2021-04-04 DIAGNOSIS — I959 Hypotension, unspecified: Secondary | ICD-10-CM | POA: Diagnosis present

## 2021-04-04 DIAGNOSIS — I5023 Acute on chronic systolic (congestive) heart failure: Secondary | ICD-10-CM | POA: Insufficient documentation

## 2021-04-04 DIAGNOSIS — G9341 Metabolic encephalopathy: Secondary | ICD-10-CM | POA: Diagnosis not present

## 2021-04-04 DIAGNOSIS — Z515 Encounter for palliative care: Secondary | ICD-10-CM

## 2021-04-04 DIAGNOSIS — R112 Nausea with vomiting, unspecified: Secondary | ICD-10-CM

## 2021-04-04 LAB — COMPREHENSIVE METABOLIC PANEL
ALT: 30 U/L (ref 0–44)
AST: 39 U/L (ref 15–41)
Albumin: 1.9 g/dL — ABNORMAL LOW (ref 3.5–5.0)
Alkaline Phosphatase: 71 U/L (ref 38–126)
Anion gap: 13 (ref 5–15)
BUN: 109 mg/dL — ABNORMAL HIGH (ref 8–23)
CO2: 21 mmol/L — ABNORMAL LOW (ref 22–32)
Calcium: 8.1 mg/dL — ABNORMAL LOW (ref 8.9–10.3)
Chloride: 92 mmol/L — ABNORMAL LOW (ref 98–111)
Creatinine, Ser: 1.9 mg/dL — ABNORMAL HIGH (ref 0.61–1.24)
GFR, Estimated: 38 mL/min — ABNORMAL LOW (ref >60)
Glucose, Bld: 78 mg/dL (ref 70–99)
Potassium: 4.9 mmol/L (ref 3.5–5.1)
Sodium: 126 mmol/L — ABNORMAL LOW (ref 135–145)
Total Bilirubin: 0.9 mg/dL (ref 0.3–1.2)
Total Protein: 5.8 g/dL — ABNORMAL LOW (ref 6.5–8.1)

## 2021-04-04 LAB — CBC WITH DIFFERENTIAL/PLATELET
Abs Immature Granulocytes: 0.11 10*3/uL — ABNORMAL HIGH (ref 0.00–0.07)
Basophils Absolute: 0 10*3/uL (ref 0.0–0.1)
Basophils Relative: 0 %
Eosinophils Absolute: 0.1 10*3/uL (ref 0.0–0.5)
Eosinophils Relative: 0 %
HCT: 28.8 % — ABNORMAL LOW (ref 39.0–52.0)
Hemoglobin: 9.2 g/dL — ABNORMAL LOW (ref 13.0–17.0)
Immature Granulocytes: 1 %
Lymphocytes Relative: 5 %
Lymphs Abs: 0.9 10*3/uL (ref 0.7–4.0)
MCH: 30.7 pg (ref 26.0–34.0)
MCHC: 31.9 g/dL (ref 30.0–36.0)
MCV: 96 fL (ref 80.0–100.0)
Monocytes Absolute: 1.2 10*3/uL — ABNORMAL HIGH (ref 0.1–1.0)
Monocytes Relative: 7 %
Neutro Abs: 16 10*3/uL — ABNORMAL HIGH (ref 1.7–7.7)
Neutrophils Relative %: 87 %
Platelets: 195 10*3/uL (ref 150–400)
RBC: 3 MIL/uL — ABNORMAL LOW (ref 4.22–5.81)
RDW: 17.1 % — ABNORMAL HIGH (ref 11.5–15.5)
WBC: 18.3 10*3/uL — ABNORMAL HIGH (ref 4.0–10.5)
nRBC: 0 % (ref 0.0–0.2)

## 2021-04-04 LAB — URINALYSIS, ROUTINE W REFLEX MICROSCOPIC
Bilirubin Urine: NEGATIVE
Glucose, UA: NEGATIVE mg/dL
Ketones, ur: NEGATIVE mg/dL
Leukocytes,Ua: NEGATIVE
Nitrite: NEGATIVE
Protein, ur: NEGATIVE mg/dL
RBC / HPF: >50 RBC/hpf — ABNORMAL HIGH (ref 0–5)
Specific Gravity, Urine: 1.011 (ref 1.005–1.030)
pH: 5 (ref 5.0–8.0)

## 2021-04-04 LAB — LIPASE, BLOOD: Lipase: 26 U/L (ref 11–51)

## 2021-04-04 LAB — PROTIME-INR
INR: 2.2 — ABNORMAL HIGH (ref 0.8–1.2)
Prothrombin Time: 24.1 seconds — ABNORMAL HIGH (ref 11.4–15.2)

## 2021-04-04 LAB — LACTIC ACID, PLASMA
Lactic Acid, Venous: 1.1 mmol/L (ref 0.5–1.9)
Lactic Acid, Venous: 0.9 mmol/L (ref 0.5–1.9)

## 2021-04-04 LAB — TROPONIN I (HIGH SENSITIVITY)
Troponin I (High Sensitivity): 200 ng/L (ref <18)
Troponin I (High Sensitivity): 223 ng/L (ref <18)

## 2021-04-04 LAB — BRAIN NATRIURETIC PEPTIDE: B Natriuretic Peptide: 731.8 pg/mL — ABNORMAL HIGH (ref 0.0–100.0)

## 2021-04-04 LAB — APTT: aPTT: 43 seconds — ABNORMAL HIGH (ref 24–36)

## 2021-04-04 MED ORDER — ONDANSETRON HCL 4 MG/2ML IJ SOLN
4.0000 mg | Freq: Once | INTRAMUSCULAR | Status: AC
  Administered 2021-04-04: 4 mg via INTRAVENOUS
  Filled 2021-04-04: qty 2

## 2021-04-04 MED ORDER — ACETAMINOPHEN 500 MG PO TABS
1000.0000 mg | ORAL_TABLET | Freq: Once | ORAL | Status: AC
  Administered 2021-04-04: 1000 mg via ORAL
  Filled 2021-04-04: qty 2

## 2021-04-04 NOTE — ED Triage Notes (Signed)
PT BIB [facility] with c/o nausea. PT states he has been nauseous all day. Denies vomiting. PT also c/o ches tpain but most likely r/t recent cardiac arrest and pacemaker placement.

## 2021-04-04 NOTE — ED Provider Notes (Signed)
[facility] EMERGENCY DEPARTMENT Provider Note   CSN: [id number] Arrival date & time: 04/04/21  1945     History Chief Complaint  Patient presents with   Nausea    [name] is a 65 y.o. male.  [name] is a 65 y.o. male with history of recent hospital admission for asystolic cardiac arrest in the setting of tachybradycardia syndrome, COVID, CHF, COPD, paroxysmal A. fib, diabetes, developmental delay, who presents to the emergency department from [place] facility for evaluation of nausea.  Patient reports that he has been persistently nauseated all day.  Reports that he tried to drink some coffee this morning and vomited, since then has not had any additional vomiting but is also not tried to eat or drink anything.  He is also complaining of generalized chest pain and soreness.  He has had some of this since his prior admission and has difficulty stating whether this is worse or change.  He denies shortness of breath but arrives on 2 L nasal cannula.  On reviewing patient's discharge summary it seems that he was discharged on room air and he reports he thinks they put him back on oxygen today or yesterday at the [place] facility.  He denies abdominal pain associated with nausea.  Has a Foley catheter in place for urinary retention that was noted during recent hospitalization.  Patient denies any other new symptoms.  Complains of chronic pain in his right foot.  Patient with limited history, likely due to developmental delay, additional history obtained from medical records review.  The history is provided by the patient, medical records and the nursing home.      Past Medical History:  Diagnosis Date   Abdominal hernia    Chronic, not a good surgical candidate   Abscess, abdomen 12/31/2010   Referred to Wound Care in 01/2011 because of multiple abd abscess with VERY large ventral hernia (please look at image of CT abd/pelvis 09/2010).   Because of hernia I was hesitant to I&D.       Anemia    History of Iron Def Anemia   Anxiety    AVM (arteriovenous malformation)    chronic GI blood loss   Carotid artery disease (HCC)    s/p CEA   Chronic diastolic CHF (congestive heart failure) (HCC)    takes Lasix   Chronic low back pain    COPD (chronic obstructive pulmonary disease) (HCC)    CVA (cerebral infarction) 09/2010   Bilateral with Left > Right   Diabetes mellitus    GERD (gastroesophageal reflux disease)    History of nuclear stress test    Myoview 9/16:  Inferior, apical and inf-lateral ischemia; not gated; High Risk   Hx of echocardiogram    Echo 5/16:  Mild LVH, EF 55%, indeterm. diast function, WMA could not be ruled out, MAC, trivial MR, mild LAE, normal RVF //  b. Echo 4/17: EF 55-60%, no RWMA, Gr 2 DD, Ao sclerosis, MAC, mild MS, mild LAE, PASP 55 mHg   Hyperlipidemia    Hypertension    Intellectual disability    Sister helps to take care of him and takes him to appts   Itching    all over body; pt scratches and has sores on bilateral arms and abdomen   Lung nodule    Myocardial infarction [facility]) 2016 ?   Heart attack  (  Per  pt. )   Obesity    Oxygen dependent  wears 2 liters at bedtime and when needed   PAF (paroxysmal atrial fibrillation) ([place]) 06/2009   CHADS score 2 (HTN, DM) // Pradaxa for Afib // Pradaxa stopped due to worsening anemia (Hgb in 5/18 8.5 >> Pradaxa remains on hold)    Pneumonia    hx of   Stroke ([place])    Tobacco user    Smokes 1ppd for multiple years.  Quit after hosp 09/2010.   Tubular adenoma of colon    Wears glasses     Patient Active Problem List   Diagnosis Date Noted   Urinary retention 04/01/2021   COVID-19 04/01/2021   S/P placement of cardiac pacemaker 04/01/2021   Chronic kidney disease    Cardiac asystole ([place]) 03/23/2021   History of cardioembolic cerebrovascular accident (CVA)    Fluid overload    Hyponatremia    Anemia of chronic disease    Pressure  injury of skin 09/26/2017   AKI (acute kidney injury) ([place])    Aspiration pneumonia of both lower lobes (HCC)    Influenza with respiratory manifestation    Tachy-brady syndrome (HCC)    Sinus pause    Intertrigo    Right bundle branch block (RBBB) on electrocardiography    Dysphagia    Septic shock ([place]) 02/13/2016   Ischemic cardiomyopathy 09/17/2015   CAD (coronary artery disease) 07/14/2015   Chronic combined systolic and diastolic CHF (congestive heart failure) ([place]) 07/14/2015   Abrasion of abdominal wall 10/26/2014   Acute respiratory failure with hypoxia ([place]) 10/26/2014   Gastritis and gastroduodenitis 06/10/2014   Benign neoplasm of ascending colon 06/10/2014   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 ([place]) 02/14/2013   Wound, open, foot 12/13/2012   IDA (iron deficiency anemia) 12/13/2012   Hyperkalemia 05/22/2012   Diabetic leg ulcer ([place]) 05/15/2012   Goals of care, counseling/discussion 05/15/2012   Leg weakness, bilateral 12/10/2011   Carotid artery disease ([place]) 08/31/2011   Sciatica 05/22/2011   Ventral hernia without obstruction or gangrene 01/24/2011   Incidental lung nodule, greater than or equal to 84m 11/24/2010   CVA (cerebral vascular accident) ([place] 10/24/2010   PAF (paroxysmal atrial fibrillation) ([place] 07/24/2009   Anxiety state 06/22/2007   DM (diabetes mellitus), type 2, uncontrolled ([place] 12/22/2006   Essential hypertension 12/22/2006   HYPERCHOLESTEROLEMIA 10/05/2006   TOBACCO DEPENDENCE 10/05/2006   Unspecified intellectual disabilities 10/05/2006   COPD (chronic obstructive pulmonary disease) ([place] 10/05/2006    Past Surgical History:  Procedure Laterality Date   AMPUTATION Right 03/29/2013   Procedure: AMPUTATION RAY;  Surgeon: MNewt Minion MD;  [place]  Service: Orthopedics;  Laterality: Right;  Right Foot 3rd and Possible 4th Ray Amputation   AMPUTATION Right 04/23/2013   Procedure: AMPUTATION RIGHT MID-FOOT;   Surgeon: MNewt Minion MD;  [place]  Service: Orthopedics;  Laterality: Right;   arm surgery Left    as a child   CARDIAC CATHETERIZATION N/A 04/30/2015   Procedure: Left Heart Cath and Coronary Angiography;  Surgeon: HBelva Crome MD;  [facility];  Service: Cardiovascular;  Laterality: N/A;   Carotid arteriogram  10/2010   30% right ICA stenosis, 40% left ICA stenosis    CAROTID ENDARTERECTOMY Left 08-13-15   CEA   CATARACT EXTRACTION, BILATERAL     COLONOSCOPY WITH PROPOFOL N/A 06/10/2014   Procedure: COLONOSCOPY WITH PROPOFOL;  Surgeon: JJerene Bears MD;  [facility];  Service: Gastroenterology;  Laterality: N/A;   CORONARY ARTERY  BYPASS GRAFT N/A 08/13/2015   Procedure: CORONARY ARTERY BYPASS GRAFTING (CABG), ON PUMP, TIMES THREE, USING LEFT INTERNAL MAMMARY ARTERY, RIGHT GREATER SAPHENOUS VEIN HARVESTED ENDOSCOPICALLY;  Surgeon: [name], MD;  Location: [place];  Service: Open Heart Surgery;  Laterality: N/A;   DEBRIDMENT OF DECUBITUS ULCER Right 02/13/2013   ENDARTERECTOMY Left 08/13/2015   Procedure: ENDARTERECTOMY CAROTID;  Surgeon: Angelia Mould, MD;  Location: [place];  Service: Vascular;  Laterality: Left;   ESOPHAGOGASTRODUODENOSCOPY (EGD) WITH PROPOFOL N/A 06/10/2014   Procedure: ESOPHAGOGASTRODUODENOSCOPY (EGD) WITH PROPOFOL;  Surgeon: Jerene Bears, MD;  [facility];  Service: Gastroenterology;  Laterality: N/A;   GIVENS CAPSULE STUDY N/A 07/09/2014   Procedure: GIVENS CAPSULE STUDY;  Surgeon: Jerene Bears, MD;  [facility];  Service: Gastroenterology;  Laterality: N/A;   I & D EXTREMITY Right 02/13/2013   Procedure: IRRIGATION AND DEBRIDEMENT FOOT ULCER;  Surgeon: Johnny Bridge, MD;  Location: [place];  Service: Orthopedics;  Laterality: Right;  PULSE LAVAGE   INSERTION OF DIALYSIS CATHETER N/A 10/02/2017   Procedure: INSERTION OF TUNNELED DIALYSIS CATHETER;  Surgeon: Elam Dutch, MD;  Location: [facility];  Service:  Vascular;  Laterality: N/A;   IR REMOVAL TUN CV CATH W/O FL  10/25/2017   MULTIPLE TOOTH EXTRACTIONS     PACEMAKER IMPLANT N/A 03/26/2021   Procedure: PACEMAKER IMPLANT;  Surgeon: Constance Haw, MD;  Location: [place] CV LAB;  Service: Cardiovascular;  Laterality: N/A;   TEE WITHOUT CARDIOVERSION N/A 08/13/2015   Procedure: TRANSESOPHAGEAL ECHOCARDIOGRAM (TEE);  Surgeon: [name], MD;  Location: [place];  Service: Open Heart Surgery;  Laterality: N/A;   TEMPORARY PACEMAKER N/A 09/15/2017   Procedure: TEMPORARY PACEMAKER;  Surgeon: Jettie Booze, MD;  Location: [place] [facility] LAB;  Service: Cardiovascular;  Laterality: N/A;   TEMPORARY PACEMAKER N/A 03/23/2021   Procedure: TEMPORARY PACEMAKER;  Surgeon: Martinique, Peter M, MD;  Location: [place] CV LAB;  Service: Cardiovascular;  Laterality: N/A;   TRANSESOPHAGEAL ECHOCARDIOGRAM  09/2010   No ASD or PFO. EF 60-65%.  Normal systolic function. No evidence of thrombus.    TRANSTHORACIC ECHOCARDIOGRAM  09/2010    The cavity size was normal. Systolic function was vigorous.  EF 65-70%.  Normal wall funciton.    ULTRASOUND GUIDANCE FOR VASCULAR ACCESS  09/15/2017   Procedure: Ultrasound Guidance For Vascular Access;  Surgeon: Jettie Booze, MD;  Location: [place] [facility] LAB;  Service: Cardiovascular;;       Family History  Problem Relation Age of Onset   Heart disease Mother    Hypertension Sister    Heart disease Brother    Heart disease Father    Peripheral vascular disease Father    Heart disease Maternal Grandmother    Heart attack Maternal Grandmother    Breast cancer Maternal Grandmother    Stomach cancer Maternal Uncle    Colon cancer Neg Hx    Stroke Neg Hx     Social History   Tobacco Use   Smoking status: Former    Packs/day: 1.00    Years: 42.00    Pack years: 42.00    Types: Cigarettes, Pipe    Quit date: 11/10/2010    Years since quitting: 10.4   Smokeless tobacco: Former    Quit date: 10/02/2010   Vaping Use   Vaping Use: Never used  Substance Use Topics   Alcohol use: No    Alcohol/week: 0.0 standard drinks    Comment: "  last drink was 2003" 08/11/15   Drug use: No    Home Medications Prior to Admission medications   Medication Sig Start Date End Date Taking? Authorizing Provider  acetaminophen (TYLENOL) 325 MG tablet Take 650 mg by mouth 3 (three) times daily as needed for mild pain.    [provider]  apixaban (ELIQUIS) 5 MG TABS tablet Take 1 tablet (5 mg total) by mouth 2 (two) times daily. 04/02/21 07/01/21  Timothy Lasso, MD  aspirin 81 MG EC tablet Take 81 mg by mouth daily.    [provider]  atorvastatin (LIPITOR) 80 MG tablet Take 1 tablet (80 mg total) by mouth daily. 10/26/17   Colbert Ewing, MD  calcium carbonate (TUMS - DOSED IN MG ELEMENTAL CALCIUM) 500 MG chewable tablet Chew 1 tablet by mouth 3 (three) times daily as needed for indigestion or heartburn.    [provider]  carvedilol (COREG) 12.5 MG tablet Take 1 tablet (12.5 mg total) by mouth 2 (two) times daily with a meal. 04/02/21 07/01/21  Timothy Lasso, MD  Cholecalciferol (VITAMIN D3) 50 MCG (2000 UT) TABS Take 50 mcg by mouth daily.    [provider]  Darbepoetin Alfa (ARANESP) 150 MCG/0.3ML SOSY injection Inject 0.3 mLs (150 mcg total) into the skin every Wednesday at 6 PM. Patient taking differently: Inject 150 mcg into the skin See admin instructions. Every 8 weeks 11/01/17   Colbert Ewing, MD  furosemide (LASIX) 80 MG tablet Take 1 tablet (80 mg total) by mouth 2 (two) times daily. 04/02/21 07/01/21  Lucious Groves, DO  insulin glargine (LANTUS) 100 UNIT/ML injection Inject 0.14 mLs (14 Units total) into the skin at bedtime. Patient taking differently: Inject 26 Units into the skin daily. 10/26/17   Colbert Ewing, MD  ipratropium-albuterol (DUONEB) 0.5-2.5 (3) MG/3ML SOLN Take 3 mLs by nebulization 2 (two) times daily as needed (COPD).    [provider]  losartan (COZAAR) 25 MG tablet Take 0.5 tablets (12.5 mg total) by mouth daily. 04/03/21 07/02/21  Timothy Lasso, MD  metFORMIN (GLUCOPHAGE) 500 MG tablet Take 1 tablet (500 mg total) by mouth daily with breakfast. 04/03/21 07/02/21  Timothy Lasso, MD  Multiple Vitamin (MULTIVITAMIN) capsule Take 1 capsule by mouth daily.    [provider]  multivitamin (RENA-VIT) TABS tablet Take 1 tablet by mouth at bedtime. Patient not taking: Reported on 03/23/2021 10/26/17   Colbert Ewing, MD  nitroGLYCERIN (NITROSTAT) 0.4 MG SL tablet Place 0.4 mg under the tongue every 5 (five) minutes as needed for chest pain.    [provider]  nystatin (MYCOSTATIN/NYSTOP) powder Apply topically 4 (four) times daily. Apply to inguinal area/scrotum and under pannus Patient taking differently: Apply 1 application topically in the morning, at noon, and at bedtime. To skin folds to treat intertrigo 03/02/18   Ardis Hughs, MD  OXYGEN Inhale 2.5 L into the lungs See admin instructions. As needed and at bedtime for shortness of breath    [provider]  pantoprazole (PROTONIX) 40 MG tablet Take 40 mg by mouth daily.    [provider]  PARoxetine (PAXIL) 40 MG tablet Take 40 mg by mouth daily.     [provider]  polyvinyl alcohol (LIQUIFILM TEARS) 1.4 % ophthalmic solution Place 1 drop into both eyes 2 (two) times daily as needed for dry eyes. Natural Tears    [provider]  senna-docusate (SENOKOT-S) 8.6-50 MG tablet Take 1 tablet by mouth at bedtime as needed for  mild constipation. 10/26/17   Colbert Ewing, MD  tamsulosin (FLOMAX) 0.4 MG CAPS capsule Take 1 capsule (0.4 mg total) by mouth daily. 04/03/21 05/03/21  Timothy Lasso, MD  tiotropium (SPIRIVA) 18 MCG inhalation capsule Place 18 mcg into inhaler and inhale daily.    [provider]  triamcinolone cream (KENALOG) 0.1 % Apply 1 application topically 2 (two) times daily as needed (legs  and other itchy areas).    [provider]    Allergies    Penicillins  Review of Systems   Review of Systems  Constitutional:  Negative for chills and fever.  Respiratory:  Positive for shortness of breath.   Cardiovascular:  Positive for chest pain.  Gastrointestinal:  Positive for nausea and vomiting. Negative for abdominal pain, constipation and diarrhea.  Genitourinary:  Negative for dysuria.  Musculoskeletal:  Negative for arthralgias and myalgias.  Skin:  Negative for color change and rash.  All other systems reviewed and are negative.  Physical Exam Updated Vital Signs BP (!) 92/59 (BP Location: Right Arm)   Pulse 98   Temp 99 F (37.2 C) (Oral)   Resp (!) 22   SpO2 100%   Physical Exam Vitals and nursing note reviewed.  Constitutional:      General: He is not in acute distress.    Appearance: Normal appearance. He is well-developed. He is ill-appearing. He is not diaphoretic.     Comments: Alert, ill-appearing, but not in acute distress  HENT:     Head: Normocephalic and atraumatic.     Mouth/Throat:     Mouth: Mucous membranes are moist.     Pharynx: Oropharynx is clear.  Eyes:     General:        Right eye: No discharge.        Left eye: No discharge.     Pupils: Pupils are equal, round, and reactive to light.  Cardiovascular:     Rate and Rhythm: Normal rate and regular rhythm.     Pulses: Normal pulses.     Heart sounds: Normal heart sounds.  Pulmonary:     Effort: No respiratory distress.     Breath sounds: Rhonchi present. No wheezing or rales.     Comments: Patient mildly tachypneic, but able to speak in full sentences, on 2 L nasal cannula, diffuse rhonchi noted on exam with some decreased air movement. Abdominal:     General: Bowel sounds are normal. There is no distension.     Palpations: Abdomen is soft. There is no mass.     Tenderness: There is no abdominal tenderness. There is no guarding.     Comments: Obese abdomen, soft and  nondistended without focal tenderness, guarding or peritoneal signs  Musculoskeletal:        General: No deformity.     Cervical back: Neck supple.     Right lower leg: No edema.     Left lower leg: No edema.     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 Bandaged skin tear of the right elbow  Skin:    General: Skin is warm and dry.     Capillary Refill: Capillary refill takes less than 2 seconds.  Neurological:     Mental Status: He is alert and oriented to person, place, and time.     Coordination: Coordination normal.     Comments: Speech is clear, able to follow commands Moves extremities without ataxia, coordination intact  Psychiatric:        Mood  and Affect: Mood normal.        Behavior: Behavior normal.    ED Results / Procedures / Treatments   Labs (all labs ordered are listed, but only abnormal results are displayed) Labs Reviewed  COMPREHENSIVE METABOLIC PANEL - Abnormal; Notable for the following components:      Result Value   Sodium 126 (*)    Chloride 92 (*)    CO2 21 (*)    BUN 109 (*)    Creatinine, Ser 1.90 (*)    Calcium 8.1 (*)    Total Protein 5.8 (*)    Albumin 1.9 (*)    GFR, Estimated 38 (*)    All other components within normal limits  CBC WITH DIFFERENTIAL/PLATELET - Abnormal; Notable for the following components:   WBC 18.3 (*)    RBC 3.00 (*)    Hemoglobin 9.2 (*)    HCT 28.8 (*)    RDW 17.1 (*)    Neutro Abs 16.0 (*)    Monocytes Absolute 1.2 (*)    Abs Immature Granulocytes 0.11 (*)    All other components within normal limits  PROTIME-INR - Abnormal; Notable for the following components:   Prothrombin Time 24.1 (*)    INR 2.2 (*)    All other components within normal limits  APTT - Abnormal; Notable for the following components:   aPTT 43 (*)    All other components within normal limits  URINALYSIS, ROUTINE W REFLEX MICROSCOPIC - Abnormal; Notable for the following components:   APPearance HAZY (*)    Hgb urine dipstick  MODERATE (*)    RBC / HPF >50 (*)    Bacteria, UA RARE (*)    All other components within normal limits  BRAIN NATRIURETIC PEPTIDE - Abnormal; Notable for the following components:   B Natriuretic Peptide 731.8 (*)    All other components within normal limits  TROPONIN I (HIGH SENSITIVITY) - Abnormal; Notable for the following components:   Troponin I (High Sensitivity) 200 (*)    All other components within normal limits  CULTURE, BLOOD (ROUTINE X 2)  CULTURE, BLOOD (ROUTINE X 2)  URINE CULTURE  LACTIC ACID, PLASMA  LACTIC ACID, PLASMA  LIPASE, BLOOD  TROPONIN I (HIGH SENSITIVITY)    EKG EKG Interpretation  Date/Time:  Sunday April 04 2021 21:26:01 EDT Ventricular Rate:  99 PR Interval:    QRS Duration: 165 QT Interval:  429 QTC Calculation: 551 R Axis:   249 Text Interpretation: Atrial fibrillation Right bundle branch block Confirmed by Davonna Belling [phone]) on 04/04/2021 10:36:23 PM  Radiology DG Chest Port 1 View  Result Date: 04/04/2021 CLINICAL DATA:  Chest pain and nausea. EXAM: PORTABLE CHEST 1 VIEW COMPARISON:  March 27, 2021 FINDINGS: The study is limited secondary to patient rotation. Multiple sternal wires are present. A dual lead AICD is noted. Stable, chronic appearing increased interstitial lung markings are seen. There is no evidence of acute infiltrate or pneumothorax. A small right pleural effusion is seen. The cardiac silhouette is enlarged and unchanged in size. Degenerative changes seen throughout the thoracic spine. IMPRESSION: Chronic appearing increased interstitial lung markings with a small right pleural effusion. Electronically Signed   By: Virgina Norfolk M.D.   On: 04/04/2021 21:57    Procedures Procedures   Medications Ordered in ED Medications  ondansetron [facility]) injection 4 mg (4 mg Intravenous Given 04/04/21 2157)  acetaminophen (TYLENOL) tablet 1,000 mg (1,000 mg Oral Given 04/04/21 2157)    ED Course  I have  reviewed the triage  vital signs and the nursing notes.  Pertinent labs & imaging results that were available during my care of the patient were reviewed by me and considered in my medical decision making (see chart for details).    MDM Rules/Calculators/A&P                          65 year old male presents from [place] for persistent nausea throughout the day, recent admission for cardiac arrest, found to be COVID-positive during this admission as well and had urinary retention.  Had AICD placed.  Has had 1 episode of vomiting today and has not been able to eat or drink anything since nausea began, no associated abdominal pain but continues to complain of diffuse chest pain.  On 2 L nasal cannula, it appears that he was discharged from the hospital 2 days ago on room air.  Blood pressure is soft on arrival at 92/59.  Will initiate broad laboratory work-up for potential infection versus ACS versus worsening renal function.  Abdominal exam is reassuring so we will hold off on imaging pending further work-up.  I have independently ordered, reviewed and interpreted all labs and imaging: EKG with atrial fibrillation, currently rate controlled.  CBC: Significant leukocytosis of 18.3, was 8.75 days ago, hemoglobin stable CMP: Mild hyponatremia of 126, creatinine 1.9, previously 1.8, BUN of 109 which appears to be chronic for patient LFTs unremarkable Initial troponin elevated at 200, was previously 712 12 days ago, may be downtrending but given patient's ongoing chest pain feel this will likely need to be trended BNP: 731.8, no level available for comparison from recent admission, may be related to recent cardiac arrest, given soft blood pressures we will hold off on diuresis UA: Hematuria present, only rare bacteria, no other signs of infection, patient currently has indwelling Foley catheter, will send for culture.  Chest x-ray with stable chronic increased interstitial markings  Will check CT abdomen pelvis given  persistent nausea and limited history from patient.  Will discuss with internal medicine teaching service for admission given patient's high risk for worsening, with new leukocytosis and elevated troponin.  We will hold off on starting antibiotics given reassuring lactic acid  Internal medicine teaching service will see and admit the patient.  Final Clinical Impression(s) / ED Diagnoses Final diagnoses:  Non-intractable vomiting with nausea, unspecified vomiting type  Elevated troponin  Central chest pain    Rx / DC Orders ED Discharge Orders     None        Janet Berlin 04/04/21 2354    Davonna Belling, MD 04/05/21 [phone]

## 2021-04-04 NOTE — H&P (Signed)
Date: 04/04/2021               Patient Name:  [name] MRN: [id number]  DOB: 03/28/1956 Age / Sex: 65 y.o., male   PCP: Joseph Adie, MD         Medical Service: Internal Medicine Teaching Service         Attending Physician: Dr. Davonna Belling, MD    First Contact: Dr. Jeanice [name] Pager: [phone]  Second Contact: Dr. Darrick [name] Pager: [phone]       After Hours (After 5p/  First Contact Pager: [phone]  weekends / holidays): Second Contact Pager: [phone]   Chief Complaint: nausea, chest soreness  History of Present Illness: [name] is a 65 y.o. 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 Patient recently admitted from 8/16-8/26 for syncope, cardiac arrest and bradycardia. Discharged to [facility]. Patient reports persistent nausea and vomiting for the last 2 days. The emesis is "coffee brown color". Denies any hematemesis. He also reports on and off chest pain that is unchanged from last admission. He has occasional abd pain but none today. Endorse left arm swelling, but no fever, headache, dizziness, blurry vision, dysuria, weakness or numbness. He also denies SOB but sleep son 2 pillows and sometimes has intermittent PND. Reports he is 2-3 L of O2 at night but was placed on 2L [place] at the facility.   Meds:  Lasix 80 mg twice daily Tamsulosin 0.4 mg daily Eliquis 5 mg twice daily Coreg 12.5 mg twice daily Losartan 12.5 mg daily Metformin 500 mg daily Lantus 25 units daily Aspirin 81 mg daily Atorvastatin 80 mg daily Aranesp 150 mg every 8 weeks Senokot 1 tablet as needed at  bedtime Nystatin powder applied 3 times daily Spiriva 18 mcg daily Oxygen 2.5 L as needed and at bedtime  No outpatient medications have been marked as taking for the 04/04/21 encounter [facility] Encounter).    Allergies: Allergies as of 04/04/2021 - Review Complete 03/30/2021  Allergen Reaction Noted   Penicillins Hives, Nausea And Vomiting, Swelling, and Other (See Comments) 06/22/2007   Past Medical History:  Diagnosis Date   Abdominal hernia    Chronic, not a good surgical candidate   Abscess, abdomen 12/31/2010   Referred to Wound Care in 01/2011 because of multiple abd abscess with VERY large ventral hernia (please look at image of CT abd/pelvis 09/2010).  Because of hernia I was hesitant to I&D.       Anemia    History of Iron Def Anemia   Anxiety    AVM (arteriovenous malformation)    chronic GI blood loss   Carotid artery disease (HCC)    s/p CEA   Chronic diastolic CHF (congestive heart failure) (HCC)    takes Lasix   Chronic low back pain    COPD (chronic obstructive pulmonary disease) (HCC)    CVA (cerebral infarction) 09/2010   Bilateral with Left > Right   Diabetes mellitus    GERD (gastroesophageal reflux disease)    History of nuclear stress test    Myoview 9/16:  Inferior, apical and inf-lateral ischemia; not gated; High Risk  Hx of echocardiogram    Echo 5/16:  Mild LVH, EF 55%, indeterm. diast function, WMA could not be ruled out, MAC, trivial MR, mild LAE, normal RVF //  b. Echo 4/17: EF 55-60%, no RWMA, Gr 2 DD, Ao sclerosis, MAC, mild MS, mild LAE, PASP 55 mHg   Hyperlipidemia    Hypertension    Intellectual disability    Sister helps to take care of him and takes him to appts   Itching    all over body; pt scratches and has sores on bilateral arms and abdomen   Lung nodule    Myocardial infarction [facility]) 2016 ?   Heart attack  (  Per  pt. )   Obesity    Oxygen dependent    wears 2 liters at bedtime and when needed   PAF (paroxysmal atrial  fibrillation) ([place]) 06/2009   CHADS score 2 (HTN, DM) // Pradaxa for Afib // Pradaxa stopped due to worsening anemia (Hgb in 5/18 8.5 >> Pradaxa remains on hold)    Pneumonia    hx of   Stroke ([place])    Tobacco user    Smokes 1ppd for multiple years.  Quit after hosp 09/2010.   Tubular adenoma of colon    Wears glasses     Family History: Hypertension in sister, heart disease in mother, brother father.  Breast cancer maternal grandmother.   Social History: Patient's sister, [name] is his healthcare proxy. Patient attends [employer].  He ambulates with a walker. Smoked a pack of cigarettes per day for 42 years before quitting in 2012.  Denied any alcohol or illicit drug use.  Review of Systems: A complete ROS was negative except as per HPI.   Physical Exam: Blood pressure 101/66, pulse 63, temperature 99 F (37.2 C), temperature source Oral, resp. rate 13, SpO2 99 %. General: Pleasant, well-appearing, obese elderly man laying in bed. No acute distress. Head: Normocephalic. Atraumatic. Neck: Difficult to assess JVD due to body habitus CV: Regular rate. Irregularly irregular rhythm. No m/r/g. 1+ BLE edema Pulmonary: On 3 L [place]. Diminished lung sounds. Rales throughout lung fields. Abdominal: Soft, nontender. Mildly distended. Reducible ventral hernia. Normal BS.  Extremities: Palpable radial pulses. Dopplerable R PT pulse and left DP and PT pulses.  Transmetatarsal amputation of the R foot. Skin: Warm and dry. Chronic and healing abdominal wounds Neuro: A&Ox3. Moves all extremities. Normal sensation. No focal deficit. Psych: Normal mood and affect   EKG: personally reviewed my interpretation is A. fib with V-paced rhythm  CXR: personally reviewed my interpretation is increased interstitial lung markings with small right pleural effusion slightly worse than previous CXR  Assessment & Plan by Problem:   [name] is a 65 y.o. male [facility] pt with PMH of mild developmental delay,  HTN, HLD, Afib on Eliquis, tachy-brady syndrome (s/p temp pacemaker on 03/26/21), chronic combined systolic/diastolic HF (EF 69%), CAD (MI 2016, s/p CABG 2017), CVA (2012), T2DM (c/b diabetic neuropathy, s/p R transmetatarsal amputation), CKD stage IIIB who presented for nausea and was admitted for heart failure exacerbation, hyponatremia and AKI   Active Problems:   Acute exacerbation of CHF (congestive heart failure) (HCC)   #Acute on chronic combined systolic and diastolic heart failure Patient's last echo 03/23/21 showed EF of 25-30%, LV global hypokinesis, moderately reduced RVSF, mod PASP. Patient endorses orthopnea and occasional PND. Exam shows rales 1+ BLE pitting edema to the knee. BNP elevated to 730 on admission. Patient on home Lasix 80 mg twice daily.  Plan to give IV Lasix 40 mg x 1 dose and assess response in the setting of soft BPs. --IV Lasix 40 mg x 1 dose --Strict I&O's, daily weights --Trend electrolytes, BMP  #Nausea #Constipation #GERD Patient with a history of intermittent nausea presented via EMS from SNF for evaluation of persistent nausea since discharge from hospital 2 days ago. Patient reports nausea, vomiting of coffee-ground emesis and constipation but denies any overt abdominal pain.  Nausea likely secondary to constipation and persistently elevated BUN. Lipase wnl. --PRN IV or p.o. Zofran 4 mg for N/V --Continue Protonix 40 mg daily --Bowel regimen --Follow-up CT abdomen/pelvis --Morning BMP  #AKI on CKD 3B #Hyponatremia Baseline creatinine around 1.8-1.9. CMP significant for creatinine of 1.9, BUN 109 and sodium of 126. AKI and hyponatremia likely prerenal in the setting of heart failure exacerbation. Can also consider diuretic use. Patient slightly fluid overloaded on exam. Will provide gentle diuresis due to SBP in the 90s to 100s. --IV Lasix as above --F/u urine studies --Strict I&O's, daily weights --Avoid nephrotoxic agents --Holding metformin and  antihypertensives for now  #Leukocytosis w/ left shift Patient found to have a white count of 18.3 on admission. Patient denies any fever, dysuria or abdominal pain. On exam patient at baseline mental status. Remains hemodynamically stable and afebrile. Patient at risk of UTIs with indwelling foley but UA shows moderate hemoglobinuria and RBCs but no signs of infection. Normal lactate. Leukocytosis likely reactive versus intra-abdominal pathology.  --Follow-up CT abdomen pelvis --Pending urine culture --Daily CBCs  #Tachybradycardia syndrome s/p temp PM #Paroxysmal A. Fib Patient was recently hospitalized on 8/16 for sick sinus syndrome with cardiac arrest and asystole. Cardiology was consulted and a temporary pacemaker was placed on 8/19.  Patient's heart rate remained in the 70s to 80s on day of discharge. EKG today shows stable A. fib with HR of 99. --Continue telemetry --Continue Eliquis 5 mg twice daily --Keep Mg > 4.0, K+ > 4.0  #CAD s/p CABG 2017 #Bilateral CAS s/p L carotic endarterectomy 2017 #Hx MI #Hx CVA #Troponinemia Patient had elevated troponin during last admission that peaked at 700.  Per patient, chest pain has been on and off since last admission and has not changed.  Troponin 200->223 improved from last admission. --Trend troponin until peak --PRN nitro for chest pain --Continue ASA 81 mg daily --Continue atorvastatin 80 mg daily  #T2DM with peripheral neuropathy A1c of 7.0 during last admission 2 weeks ago. Home diabetes regimen includes Lantus 25 units daily and metformin 500 mg daily. Holding Lantus in the setting of poor p.o. intake secondary to nausea. Glucose of 78 on admission. --SSI, moderate --Q4H CBG checks --Resume Lantus as nausea improves  #Urinary retention --Continue foley --Continue Flomax 0.4 mg daily  Dispo: Admit patient to Observation with expected length of stay less than 2 midnights.  Signed: Lacinda Axon, MD 04/04/2021, 10:47 PM   Pager: [phone]  After 5pm on weekdays and 1pm on weekends: On Call pager: [phone]

## 2021-04-05 ENCOUNTER — Inpatient Hospital Stay (HOSPITAL_COMMUNITY): Payer: Medicare (Managed Care)

## 2021-04-05 ENCOUNTER — Observation Stay (HOSPITAL_COMMUNITY): Payer: Medicare (Managed Care)

## 2021-04-05 DIAGNOSIS — I9589 Other hypotension: Secondary | ICD-10-CM

## 2021-04-05 DIAGNOSIS — I472 Ventricular tachycardia: Secondary | ICD-10-CM | POA: Diagnosis not present

## 2021-04-05 DIAGNOSIS — E1142 Type 2 diabetes mellitus with diabetic polyneuropathy: Secondary | ICD-10-CM | POA: Diagnosis present

## 2021-04-05 DIAGNOSIS — I959 Hypotension, unspecified: Secondary | ICD-10-CM | POA: Diagnosis present

## 2021-04-05 DIAGNOSIS — D500 Iron deficiency anemia secondary to blood loss (chronic): Secondary | ICD-10-CM | POA: Diagnosis present

## 2021-04-05 DIAGNOSIS — E669 Obesity, unspecified: Secondary | ICD-10-CM | POA: Diagnosis present

## 2021-04-05 DIAGNOSIS — I4819 Other persistent atrial fibrillation: Secondary | ICD-10-CM

## 2021-04-05 DIAGNOSIS — Z515 Encounter for palliative care: Secondary | ICD-10-CM | POA: Diagnosis not present

## 2021-04-05 DIAGNOSIS — Z7189 Other specified counseling: Secondary | ICD-10-CM | POA: Diagnosis not present

## 2021-04-05 DIAGNOSIS — I5023 Acute on chronic systolic (congestive) heart failure: Secondary | ICD-10-CM | POA: Diagnosis not present

## 2021-04-05 DIAGNOSIS — E861 Hypovolemia: Secondary | ICD-10-CM | POA: Diagnosis not present

## 2021-04-05 DIAGNOSIS — Z8616 Personal history of COVID-19: Secondary | ICD-10-CM | POA: Diagnosis not present

## 2021-04-05 DIAGNOSIS — E1122 Type 2 diabetes mellitus with diabetic chronic kidney disease: Secondary | ICD-10-CM | POA: Diagnosis present

## 2021-04-05 DIAGNOSIS — G9341 Metabolic encephalopathy: Secondary | ICD-10-CM | POA: Diagnosis not present

## 2021-04-05 DIAGNOSIS — D638 Anemia in other chronic diseases classified elsewhere: Secondary | ICD-10-CM | POA: Diagnosis present

## 2021-04-05 DIAGNOSIS — I5043 Acute on chronic combined systolic (congestive) and diastolic (congestive) heart failure: Secondary | ICD-10-CM | POA: Diagnosis present

## 2021-04-05 DIAGNOSIS — N1832 Chronic kidney disease, stage 3b: Secondary | ICD-10-CM

## 2021-04-05 DIAGNOSIS — E871 Hypo-osmolality and hyponatremia: Secondary | ICD-10-CM

## 2021-04-05 DIAGNOSIS — I509 Heart failure, unspecified: Secondary | ICD-10-CM | POA: Diagnosis not present

## 2021-04-05 DIAGNOSIS — R079 Chest pain, unspecified: Secondary | ICD-10-CM | POA: Diagnosis present

## 2021-04-05 DIAGNOSIS — Z66 Do not resuscitate: Secondary | ICD-10-CM | POA: Diagnosis not present

## 2021-04-05 DIAGNOSIS — I495 Sick sinus syndrome: Secondary | ICD-10-CM | POA: Diagnosis present

## 2021-04-05 DIAGNOSIS — J440 Chronic obstructive pulmonary disease with acute lower respiratory infection: Secondary | ICD-10-CM | POA: Diagnosis present

## 2021-04-05 DIAGNOSIS — J9601 Acute respiratory failure with hypoxia: Secondary | ICD-10-CM | POA: Diagnosis not present

## 2021-04-05 DIAGNOSIS — Z6838 Body mass index (BMI) 38.0-38.9, adult: Secondary | ICD-10-CM | POA: Diagnosis not present

## 2021-04-05 DIAGNOSIS — J189 Pneumonia, unspecified organism: Secondary | ICD-10-CM | POA: Diagnosis present

## 2021-04-05 DIAGNOSIS — I130 Hypertensive heart and chronic kidney disease with heart failure and stage 1 through stage 4 chronic kidney disease, or unspecified chronic kidney disease: Secondary | ICD-10-CM | POA: Diagnosis present

## 2021-04-05 DIAGNOSIS — I452 Bifascicular block: Secondary | ICD-10-CM | POA: Diagnosis present

## 2021-04-05 DIAGNOSIS — D692 Other nonthrombocytopenic purpura: Secondary | ICD-10-CM | POA: Diagnosis not present

## 2021-04-05 LAB — BASIC METABOLIC PANEL
Anion gap: 13 (ref 5–15)
BUN: 112 mg/dL — ABNORMAL HIGH (ref 8–23)
CO2: 19 mmol/L — ABNORMAL LOW (ref 22–32)
Calcium: 7.9 mg/dL — ABNORMAL LOW (ref 8.9–10.3)
Chloride: 92 mmol/L — ABNORMAL LOW (ref 98–111)
Creatinine, Ser: 1.88 mg/dL — ABNORMAL HIGH (ref 0.61–1.24)
GFR, Estimated: 39 mL/min — ABNORMAL LOW (ref >60)
Glucose, Bld: 77 mg/dL (ref 70–99)
Potassium: 5.2 mmol/L — ABNORMAL HIGH (ref 3.5–5.1)
Sodium: 124 mmol/L — ABNORMAL LOW (ref 135–145)

## 2021-04-05 LAB — GLUCOSE, CAPILLARY: Glucose-Capillary: 126 mg/dL — ABNORMAL HIGH (ref 70–99)

## 2021-04-05 LAB — MAGNESIUM: Magnesium: 2.1 mg/dL (ref 1.7–2.4)

## 2021-04-05 LAB — CBG MONITORING, ED
Glucose-Capillary: 83 mg/dL (ref 70–99)
Glucose-Capillary: 72 mg/dL (ref 70–99)
Glucose-Capillary: 82 mg/dL (ref 70–99)
Glucose-Capillary: 104 mg/dL — ABNORMAL HIGH (ref 70–99)
Glucose-Capillary: 102 mg/dL — ABNORMAL HIGH (ref 70–99)
Glucose-Capillary: 107 mg/dL — ABNORMAL HIGH (ref 70–99)

## 2021-04-05 LAB — SODIUM, URINE, RANDOM: Sodium, Ur: 23 mmol/L

## 2021-04-05 LAB — CBC
HCT: 33.8 % — ABNORMAL LOW (ref 39.0–52.0)
Hemoglobin: 10.5 g/dL — ABNORMAL LOW (ref 13.0–17.0)
MCH: 30.2 pg (ref 26.0–34.0)
MCHC: 31.1 g/dL (ref 30.0–36.0)
MCV: 97.1 fL (ref 80.0–100.0)
Platelets: 153 10*3/uL (ref 150–400)
RBC: 3.48 MIL/uL — ABNORMAL LOW (ref 4.22–5.81)
RDW: 17 % — ABNORMAL HIGH (ref 11.5–15.5)
WBC: 12.7 10*3/uL — ABNORMAL HIGH (ref 4.0–10.5)
nRBC: 0 % (ref 0.0–0.2)

## 2021-04-05 LAB — APTT: aPTT: 62 seconds — ABNORMAL HIGH (ref 24–36)

## 2021-04-05 LAB — PROTIME-INR
INR: 2.2 — ABNORMAL HIGH (ref 0.8–1.2)
Prothrombin Time: 24.3 seconds — ABNORMAL HIGH (ref 11.4–15.2)

## 2021-04-05 LAB — TROPONIN I (HIGH SENSITIVITY)
Troponin I (High Sensitivity): 243 ng/L (ref <18)
Troponin I (High Sensitivity): 245 ng/L (ref <18)

## 2021-04-05 MED ORDER — APIXABAN 5 MG PO TABS
5.0000 mg | ORAL_TABLET | Freq: Two times a day (BID) | ORAL | Status: DC
  Administered 2021-04-05 – 2021-04-07 (×6): 5 mg via ORAL
  Filled 2021-04-05 (×6): qty 1

## 2021-04-05 MED ORDER — ENOXAPARIN SODIUM 40 MG/0.4ML IJ SOSY: 40.0000 mg | PREFILLED_SYRINGE | Freq: Every day | INTRAMUSCULAR | Status: DC

## 2021-04-05 MED ORDER — CHLORHEXIDINE GLUCONATE CLOTH 2 % EX PADS
6.0000 | MEDICATED_PAD | Freq: Every day | CUTANEOUS | Status: DC
  Administered 2021-04-06 – 2021-04-13 (×9): 6 via TOPICAL

## 2021-04-05 MED ORDER — TAMSULOSIN HCL 0.4 MG PO CAPS
0.4000 mg | ORAL_CAPSULE | Freq: Every day | ORAL | Status: DC
  Administered 2021-04-05 – 2021-04-09 (×5): 0.4 mg via ORAL
  Filled 2021-04-05 (×5): qty 1

## 2021-04-05 MED ORDER — NITROGLYCERIN 0.4 MG SL SUBL: 0.4000 mg | SUBLINGUAL_TABLET | SUBLINGUAL | Status: DC | PRN

## 2021-04-05 MED ORDER — ONDANSETRON HCL 4 MG PO TABS: 4.0000 mg | ORAL_TABLET | Freq: Four times a day (QID) | ORAL | Status: DC | PRN

## 2021-04-05 MED ORDER — ACETAMINOPHEN 650 MG RE SUPP: 650.0000 mg | Freq: Four times a day (QID) | RECTAL | Status: DC | PRN

## 2021-04-05 MED ORDER — PAROXETINE HCL 20 MG PO TABS
40.0000 mg | ORAL_TABLET | Freq: Every day | ORAL | Status: DC
  Administered 2021-04-05 – 2021-04-09 (×5): 40 mg via ORAL
  Filled 2021-04-05 (×5): qty 2

## 2021-04-05 MED ORDER — NYSTATIN 100000 UNIT/GM EX POWD
1.0000 "application " | Freq: Three times a day (TID) | CUTANEOUS | Status: DC
  Administered 2021-04-05 – 2021-04-09 (×11): 1 via TOPICAL
  Filled 2021-04-05 (×2): qty 15

## 2021-04-05 MED ORDER — SENNA 8.6 MG PO TABS
1.0000 | ORAL_TABLET | Freq: Two times a day (BID) | ORAL | Status: DC
  Administered 2021-04-05 – 2021-04-09 (×9): 8.6 mg via ORAL
  Filled 2021-04-05 (×9): qty 1

## 2021-04-05 MED ORDER — IPRATROPIUM-ALBUTEROL 0.5-2.5 (3) MG/3ML IN SOLN
3.0000 mL | Freq: Two times a day (BID) | RESPIRATORY_TRACT | Status: DC | PRN
  Administered 2021-04-05: 3 mL via RESPIRATORY_TRACT
  Filled 2021-04-05: qty 3

## 2021-04-05 MED ORDER — ATORVASTATIN CALCIUM 80 MG PO TABS
80.0000 mg | ORAL_TABLET | Freq: Every day | ORAL | Status: DC
  Administered 2021-04-05 – 2021-04-09 (×5): 80 mg via ORAL
  Filled 2021-04-05 (×4): qty 1
  Filled 2021-04-05: qty 2

## 2021-04-05 MED ORDER — UMECLIDINIUM BROMIDE 62.5 MCG/INH IN AEPB
1.0000 | INHALATION_SPRAY | Freq: Every day | RESPIRATORY_TRACT | Status: DC
  Administered 2021-04-05 – 2021-04-07 (×3): 1 via RESPIRATORY_TRACT
  Filled 2021-04-05 (×2): qty 7

## 2021-04-05 MED ORDER — ACETAMINOPHEN 325 MG PO TABS
650.0000 mg | ORAL_TABLET | Freq: Four times a day (QID) | ORAL | Status: DC | PRN
  Administered 2021-04-05 – 2021-04-06 (×2): 650 mg via ORAL
  Filled 2021-04-05 (×3): qty 2

## 2021-04-05 MED ORDER — ONDANSETRON HCL 4 MG/2ML IJ SOLN
4.0000 mg | Freq: Four times a day (QID) | INTRAMUSCULAR | Status: DC | PRN
  Administered 2021-04-05: 4 mg via INTRAVENOUS
  Filled 2021-04-05: qty 2

## 2021-04-05 MED ORDER — FUROSEMIDE 10 MG/ML IJ SOLN
40.0000 mg | Freq: Once | INTRAMUSCULAR | Status: AC
  Administered 2021-04-05: 40 mg via INTRAVENOUS
  Filled 2021-04-05: qty 4

## 2021-04-05 MED ORDER — POLYETHYLENE GLYCOL 3350 17 G PO PACK: 17.0000 g | PACK | Freq: Every day | ORAL | Status: DC | PRN

## 2021-04-05 MED ORDER — INSULIN ASPART 100 UNIT/ML IJ SOLN
0.0000 [IU] | INTRAMUSCULAR | Status: DC
Start: 2021-04-05 — End: 2021-04-09
  Administered 2021-04-05: 3 [IU] via SUBCUTANEOUS
  Administered 2021-04-06: 4 [IU] via SUBCUTANEOUS
  Administered 2021-04-06: 3 [IU] via SUBCUTANEOUS
  Administered 2021-04-07 (×3): 4 [IU] via SUBCUTANEOUS
  Administered 2021-04-08 (×2): 3 [IU] via SUBCUTANEOUS

## 2021-04-05 MED ORDER — ASPIRIN EC 81 MG PO TBEC
81.0000 mg | DELAYED_RELEASE_TABLET | Freq: Every day | ORAL | Status: DC
  Administered 2021-04-05 – 2021-04-09 (×5): 81 mg via ORAL
  Filled 2021-04-05 (×5): qty 1

## 2021-04-05 MED ORDER — PANTOPRAZOLE SODIUM 40 MG PO TBEC
40.0000 mg | DELAYED_RELEASE_TABLET | Freq: Every day | ORAL | Status: DC
  Administered 2021-04-05 – 2021-04-09 (×5): 40 mg via ORAL
  Filled 2021-04-05 (×5): qty 1

## 2021-04-05 MED ORDER — SODIUM ZIRCONIUM CYCLOSILICATE 5 G PO PACK
5.0000 g | PACK | Freq: Once | ORAL | Status: AC
  Administered 2021-04-05: 5 g via ORAL
  Filled 2021-04-05: qty 1

## 2021-04-05 NOTE — Progress Notes (Signed)
Subjective: I seen and evaluated [name] at bedside.  He was vomiting and retching throughout interview.  He stated that his nausea and vomiting began after previous discharge with new medication metformin.  According to records received from [facility], he did not receive his Lasix as prescribed following discharge.   Objective:  Vital signs in last 24 hours: Vitals:   04/05/21 0515 04/05/21 0530 04/05/21 0600 04/05/21 0615  BP: 99/85 102/71 (!) 110/46 (!) 107/95  Pulse: 99 82 84 95  Resp: (!) 21     Temp:      TempSrc:      SpO2: 97% 94% 98% 96%   Physical Exam Constitutional:      Appearance: He is ill-appearing.     Comments: Patient actively vomiting at bedside.  HENT:     Head: Normocephalic and atraumatic.  Cardiovascular:     Comments: Pitting edema noted in bilateral extremities  Pulmonary:     Comments: Unable to auscultate due to continuous vomiting at bedside Musculoskeletal:     Right lower leg: 3+ Pitting Edema present.     Left lower leg: 3+ Pitting Edema present.     Comments: Bilateral arms edematous and weeping.   Skin:    General: Skin is warm.     Comments: Bluish discoloration noted on bilateral lower extremities. Weeping of bilateral forearms.   Neurological:     Mental Status: He is alert. Mental status is at baseline.     Assessment/Plan:  Active Problems:   Acute exacerbation of CHF (congestive heart failure) ([place])  [name] is a 65 y.o. male [facility] pt with PMH of mild developmental delay, HTN, HLD, Afib on Eliquis, tachy-brady syndrome (s/p temp pacemaker on 03/26/21), chronic combined systolic/diastolic HF (EF 123456), CAD (MI 2016, s/p CABG 2017), CVA (2012), T2DM (c/b diabetic neuropathy, s/p R transmetatarsal amputation), CKD stage IIIB who presented for nausea and was admitted for heart failure exacerbation, hyponatremia and AKI.  Acute on chronic combined systolic and diastolic heart failure Patient's last echo 03/23/21 showed  EF of 25-30%, LV global hypokinesis, moderately reduced RVSF, mod PASP.In the ED patient endorsed orthopnea and occasional PND; BNP elevated to 730 on admission. Patient on home Lasix 80 mg twice daily. Patient was given 1 dose of IV Lasix 40 mg in the ED.  In the setting of soft pressures, will monitor volume status carefully and dose Lasix appropriately. Cardiology consult. --IV lasix '40mg'$  one dose --Strict I&O's, daily weights --Trend electrolytes, BMP   Medication Adverse Effect, Metformin Patient endorsed persistent nausea,vomiting of coffee ground emesis and constipation since discharge from hospital 2 days ago.Patient was started on metformin prior to discharge; could be of possible cause of nausea vomiting, given common side effect of metformin. Elevated BUN at 112. Lipase wnl.  CT of the abdomen and pelvis reveals large right ventral hernia containing the majority of the small and large bowel; no evidence of bowel obstruction; partially visualized large area of consolidation involving the right lung base with most consistent with pneumonia (unchanged from imaging on 03/23/21); aspiration or underlying mass is not excluded. --PRN IV (if patient is still vomiting) or p.o. Zofran 4 mg for N/V  --Continue Protonix 40 mg daily --Bowel regimen --Hold Metformin --Morning BMP   #AKI on CKD 3B #Hyponatremia Baseline creatinine around 1.8-1.9. CMP significant for creatinine of 1.9, BUN 109 and sodium of 126. AKI and hyponatremia likely prerenal in the setting of heart failure exacerbation.  Patient also endorses nausea and  vomiting which could contribute to prerenal hypovolemia. Can also consider diuretic use.  Urine sodium =23. Patient was found to be hyperkalemic at 5.2 on morning labs; given one does of lokelma.  --Repeat BMP in the AM --Strict I&O's, daily weights --Avoid nephrotoxic agents --Hold metformin --antihypertensives in the setting of soft pressures.   #Leukocytosis w/ left  shift Patient found to have a white count of 18.3 on admission.  Current white count trending down at 12.7. Patient remains hemodynamically stable and afebrile.  Patient at risk of UTIs with indwelling foley. UA shows moderate hemoglobinuria and RBCs but no signs of infection. Normal lactate. Leukocytosis likely reactive versus intra-abdominal pathology.  CT of abdominal and pelvis negative. --Pending urine culture --Daily CBCs   #Tachybradycardia syndrome s/p temp PM #Paroxysmal A. Fib Patient was recently hospitalized on 8/16 for sick sinus syndrome with cardiac arrest and asystole. Cardiology was consulted and a PPM was placed on 8/19. EKG shows stable A. fib with HR of 99. Potassium elevated at 5.2; magnesium within normal limits at 2.1. --Continue telemetry --Continue Eliquis 5 mg twice daily --1 dose of Lokelma given today   #CAD s/p CABG 2017 #Bilateral CAS s/p L carotic endarterectomy 2017 #Troponinemia Patient had elevated troponin during last admission that peaked at 700.  Per patient, chest pain has been on and off since last admission and has not changed.  Troponin 200->223>>243>>245  Cardiology consulted  --Trend troponin until peak --PRN nitro for chest pain --ASA 81 mg daily --atorvastatin 80 mg daily   #T2DM with peripheral neuropathy A1c of 7.0 during last admission 2 weeks ago. Home diabetes regimen includes Lantus 25 units daily and metformin 500 mg daily.  Holding Lantus in the setting of poor p.o. intake secondary to nausea. Glucose of 78 on admission. Current CBGs have been <110. --SSI, moderate --Q4H CBG checks --Resume Lantus as nausea/vomiting subsides.   #Urinary retention --Continue foley --Continue Flomax 0.4 mg daily    Prior to Admission Living Arrangement: Anticipated Discharge Location: Barriers to Discharge: Dispo: Anticipated discharge in approximately 2-3 day(s).   Joseph Lasso, MD 04/05/2021, 8:11 AM Pager: [phone] After 5pm on  weekdays and 1pm on weekends: On Call pager [phone]

## 2021-04-05 NOTE — ED Notes (Signed)
Notified Dr. Darrick Meigs of trending lower BP's 80s/ 60s at this time. Pt asymptomatic. No new orders received. Will continue to monitor this.

## 2021-04-05 NOTE — Consult Note (Signed)
Cardiology Consultation:  Patient ID: [name] MRN: [id number]; DOB: Nov 24, 1955  Admit date: 04/04/2021 Date of Consult: 04/05/2021  Primary Care Provider: Janifer Adie, MD Primary Cardiologist: Mertie Moores, MD  Primary Electrophysiologist:  None   Patient Profile:  [name] is a 65 y.o. male with a hx of CAD status post CABG, paroxysmal atrial fibrillation, recent bradycardic arrest/sick sinus syndrome status post CRT-P, carotid artery disease status post left CEA, diabetes, developmental delay who is being seen today for the evaluation of systolic heart at the request of Miguel Dibble, DO.  History of Present Illness:  [name] presents with 2 days of nausea and vomiting.  He also reports weakness and fatigue.  He was recently admitted to the hospital between 03/23/2021 and 04/02/2021 for syncope and cardiac arrest secondary to bradycardia.  His ejection fraction was also noted to be reduced around 20%.  He was not deemed a candidate for pacing therapy at the time however he did not improve with temporary pacing support.  CRT-P device was placed.  Due to significant CKD he was not a candidate for aggressive heart failure medications.  He has been discharged to skilled nursing facility.  He is not active.  On arrival to the emergency room here he has been hypotensive.  Blood pressure during my examination 80s over 50s.  He reports he just does not feel well.  He has been given 2 doses of IV Lasix.  Labs were notable for hyponatremia with a value of 124.  BUN is increased up to 112.  Serum creatinine stable around 1.88.  BNP value obtained this admission 731.  Troponin is minimally elevated and flat at 200 up to 243.  Notable leukocytosis on admission up to 18.3.  He does have a left shift.  He is not on antibiotics.  Chest x-ray with interstitial lung markings consistent with chronic appearance.  CT abdomen pelvis consistent with pneumonia.  Telemetry shows he is converted  back to atrial fibrillation with RVR.  Rates are stable in the low 100s.  EKG demonstrates atrial fibrillation heart rate 99 with right bundle branch block and left anterior fascicular block.   Heart Pathway Score:       Past Medical History: Past Medical History:  Diagnosis Date   Abdominal hernia    Chronic, not a good surgical candidate   Abscess, abdomen 12/31/2010   Referred to Wound Care in 01/2011 because of multiple abd abscess with VERY large ventral hernia (please look at image of CT abd/pelvis 09/2010).  Because of hernia I was hesitant to I&D.       Anemia    History of Iron Def Anemia   Anxiety    AVM (arteriovenous malformation)    chronic GI blood loss   Carotid artery disease (HCC)    s/p CEA   Chronic diastolic CHF (congestive heart failure) (HCC)    takes Lasix   Chronic low back pain    COPD (chronic obstructive pulmonary disease) (HCC)    CVA (cerebral infarction) 09/2010   Bilateral with Left > Right   Diabetes mellitus    GERD (gastroesophageal reflux disease)    History of nuclear stress test    Myoview 9/16:  Inferior, apical and inf-lateral ischemia; not gated; High Risk   Hx of echocardiogram    Echo 5/16:  Mild LVH, EF 55%, indeterm. diast function, WMA could not be ruled out, MAC, trivial MR, mild LAE, normal RVF //  b. Echo 4/17: EF 55-60%,  no RWMA, Gr 2 DD, Ao sclerosis, MAC, mild MS, mild LAE, PASP 55 mHg   Hyperlipidemia    Hypertension    Intellectual disability    Sister helps to take care of him and takes him to appts   Itching    all over body; pt scratches and has sores on bilateral arms and abdomen   Lung nodule    Myocardial infarction [facility]) 2016 ?   Heart attack  (  Per  pt. )   Obesity    Oxygen dependent    wears 2 liters at bedtime and when needed   PAF (paroxysmal atrial fibrillation) ([place]) 06/2009   CHADS score 2 (HTN, DM) // Pradaxa for Afib // Pradaxa stopped due to worsening anemia (Hgb in 5/18 8.5 >> Pradaxa remains on hold)     Pneumonia    hx of   Stroke ([place])    Tobacco user    Smokes 1ppd for multiple years.  Quit after hosp 09/2010.   Tubular adenoma of colon    Wears glasses     Past Surgical History: Past Surgical History:  Procedure Laterality Date   AMPUTATION Right 03/29/2013   Procedure: AMPUTATION RAY;  Surgeon: Newt Minion, MD;  Location: [place];  Service: Orthopedics;  Laterality: Right;  Right Foot 3rd and Possible 4th Ray Amputation   AMPUTATION Right 04/23/2013   Procedure: AMPUTATION RIGHT MID-FOOT;  Surgeon: Newt Minion, MD;  Location: [place];  Service: Orthopedics;  Laterality: Right;   arm surgery Left    as a child   CARDIAC CATHETERIZATION N/A 04/30/2015   Procedure: Left Heart Cath and Coronary Angiography;  Surgeon: Belva Crome, MD;  Location: [place] [facility];  Service: Cardiovascular;  Laterality: N/A;   Carotid arteriogram  10/2010   30% right ICA stenosis, 40% left ICA stenosis    CAROTID ENDARTERECTOMY Left 08-13-15   CEA   CATARACT EXTRACTION, BILATERAL     COLONOSCOPY WITH PROPOFOL N/A 06/10/2014   Procedure: COLONOSCOPY WITH PROPOFOL;  Surgeon: Jerene Bears, MD;  Location: [facility];  Service: Gastroenterology;  Laterality: N/A;   CORONARY ARTERY BYPASS GRAFT N/A 08/13/2015   Procedure: CORONARY ARTERY BYPASS GRAFTING (CABG), ON PUMP, TIMES THREE, USING LEFT INTERNAL MAMMARY ARTERY, RIGHT GREATER SAPHENOUS VEIN HARVESTED ENDOSCOPICALLY;  Surgeon: [name], MD;  Location: [place];  Service: Open Heart Surgery;  Laterality: N/A;   DEBRIDMENT OF DECUBITUS ULCER Right 02/13/2013   ENDARTERECTOMY Left 08/13/2015   Procedure: ENDARTERECTOMY CAROTID;  Surgeon: Angelia Mould, MD;  Location: [place];  Service: Vascular;  Laterality: Left;   ESOPHAGOGASTRODUODENOSCOPY (EGD) WITH PROPOFOL N/A 06/10/2014   Procedure: ESOPHAGOGASTRODUODENOSCOPY (EGD) WITH PROPOFOL;  Surgeon: Jerene Bears, MD;  [facility];  Service: Gastroenterology;  Laterality: N/A;   GIVENS CAPSULE  STUDY N/A 07/09/2014   Procedure: GIVENS CAPSULE STUDY;  Surgeon: Jerene Bears, MD;  [facility];  Service: Gastroenterology;  Laterality: N/A;   I & D EXTREMITY Right 02/13/2013   Procedure: IRRIGATION AND DEBRIDEMENT FOOT ULCER;  Surgeon: Johnny Bridge, MD;  Location: [place];  Service: Orthopedics;  Laterality: Right;  PULSE LAVAGE   INSERTION OF DIALYSIS CATHETER N/A 10/02/2017   Procedure: INSERTION OF TUNNELED DIALYSIS CATHETER;  Surgeon: Elam Dutch, MD;  [facility];  Service: Vascular;  Laterality: N/A;   IR REMOVAL TUN CV CATH W/O FL  10/25/2017   MULTIPLE TOOTH EXTRACTIONS     PACEMAKER IMPLANT N/A 03/26/2021  Procedure: PACEMAKER IMPLANT;  Surgeon: Constance Haw, MD;  Location: [place] [facility];  Service: Cardiovascular;  Laterality: N/A;   TEE WITHOUT CARDIOVERSION N/A 08/13/2015   Procedure: TRANSESOPHAGEAL ECHOCARDIOGRAM (TEE);  Surgeon: [name], MD;  Location: [place];  Service: Open Heart Surgery;  Laterality: N/A;   TEMPORARY PACEMAKER N/A 09/15/2017   Procedure: TEMPORARY PACEMAKER;  Surgeon: Jettie Booze, MD;  Location: [place] [facility];  Service: Cardiovascular;  Laterality: N/A;   TEMPORARY PACEMAKER N/A 03/23/2021   Procedure: TEMPORARY PACEMAKER;  Surgeon: Martinique, Peter M, MD;  Location: [place] [facility];  Service: Cardiovascular;  Laterality: N/A;   TRANSESOPHAGEAL ECHOCARDIOGRAM  09/2010   No ASD or PFO. EF 60-65%.  Normal systolic function. No evidence of thrombus.    TRANSTHORACIC ECHOCARDIOGRAM  09/2010    The cavity size was normal. Systolic function was vigorous.  EF 65-70%.  Normal wall funciton.    ULTRASOUND GUIDANCE FOR VASCULAR ACCESS  09/15/2017   Procedure: Ultrasound Guidance For Vascular Access;  Surgeon: Jettie Booze, MD;  Location: [place] [facility];  Service: Cardiovascular;;     Home Medications:  Prior to Admission medications   Medication Sig Start Date End Date Taking? Authorizing Provider   acetaminophen (TYLENOL) 325 MG tablet Take 650 mg by mouth 3 (three) times daily as needed for mild pain.   Yes [provider]  apixaban (ELIQUIS) 5 MG TABS tablet Take 1 tablet (5 mg total) by mouth 2 (two) times daily. 04/02/21 07/01/21 Yes Timothy Lasso, MD  aspirin 81 MG EC tablet Take 81 mg by mouth daily.   Yes [provider]  atorvastatin (LIPITOR) 80 MG tablet Take 1 tablet (80 mg total) by mouth daily. 10/26/17  Yes Colbert Ewing, MD  carvedilol (COREG) 12.5 MG tablet Take 1 tablet (12.5 mg total) by mouth 2 (two) times daily with a meal. 04/02/21 07/01/21 Yes Timothy Lasso, MD  Cholecalciferol (VITAMIN D3) 50 MCG (2000 UT) TABS Take 50 mcg by mouth daily.   Yes [provider]  guaiFENesin (ROBITUSSIN) 100 MG/5ML SOLN Take 5 mLs by mouth every 4 (four) hours as needed for cough or to loosen phlegm.   Yes [provider]  insulin glargine (LANTUS) 100 UNIT/ML injection Inject 0.14 mLs (14 Units total) into the skin at bedtime. 10/26/17  Yes Colbert Ewing, MD  losartan (COZAAR) 25 MG tablet Take 0.5 tablets (12.5 mg total) by mouth daily. 04/03/21 07/02/21 Yes Timothy Lasso, MD  metFORMIN (GLUCOPHAGE) 500 MG tablet Take 1 tablet (500 mg total) by mouth daily with breakfast. 04/03/21 07/02/21 Yes Timothy Lasso, MD  Multiple Vitamin (MULTIVITAMIN) capsule Take 1 capsule by mouth daily.   Yes [provider]  multivitamin (RENA-VIT) TABS tablet Take 1 tablet by mouth at bedtime. 10/26/17  Yes Colbert Ewing, MD  nitroGLYCERIN (NITROSTAT) 0.4 MG SL tablet Place 0.4 mg under the tongue every 5 (five) minutes as needed for chest pain.   Yes [provider]  nystatin (MYCOSTATIN/NYSTOP) powder Apply topically 4 (four) times daily. Apply to inguinal area/scrotum and under pannus Patient taking differently: Apply 1 application topically in the morning, at noon, and at bedtime. To skin folds to treat intertrigo 03/02/18  Yes Ardis Hughs, MD  pantoprazole (PROTONIX) 40 MG tablet Take 40 mg by mouth daily.   Yes [provider]  PARoxetine (PAXIL) 40 MG tablet Take 40 mg by mouth daily.    Yes [provider]  polyvinyl alcohol (LIQUIFILM TEARS)  1.4 % ophthalmic solution Place 1 drop into both eyes 2 (two) times daily as needed for dry eyes. Natural Tears   Yes [provider]  senna-docusate (SENOKOT-S) 8.6-50 MG tablet Take 1 tablet by mouth at bedtime as needed for mild constipation. 10/26/17  Yes Colbert Ewing, MD  tamsulosin (FLOMAX) 0.4 MG CAPS capsule Take 1 capsule (0.4 mg total) by mouth daily. 04/03/21 05/03/21 Yes Timothy Lasso, MD  tiotropium (SPIRIVA) 18 MCG inhalation capsule Place 18 mcg into inhaler and inhale daily.   Yes [provider]  Darbepoetin Alfa (ARANESP) 150 MCG/0.3ML SOSY injection Inject 0.3 mLs (150 mcg total) into the skin every Wednesday at 6 PM. Patient not taking: Reported on 04/05/2021 11/01/17   Colbert Ewing, MD  furosemide (LASIX) 80 MG tablet Take 1 tablet (80 mg total) by mouth 2 (two) times daily. 04/02/21 07/01/21  Lucious Groves, DO  OXYGEN Inhale 2.5 L into the lungs See admin instructions. As needed and at bedtime for shortness of breath    [provider]    Inpatient Medications: Scheduled Meds:  apixaban  5 mg Oral BID   aspirin EC  81 mg Oral Daily   atorvastatin  80 mg Oral Daily   insulin aspart  0-20 Units Subcutaneous Q4H   nystatin  1 application Topical TID   pantoprazole  40 mg Oral Daily   PARoxetine  40 mg Oral Daily   senna  1 tablet Oral BID   tamsulosin  0.4 mg Oral Daily   umeclidinium bromide  1 puff Inhalation Daily   Continuous Infusions:  PRN Meds: acetaminophen **OR** acetaminophen, ipratropium-albuterol, nitroGLYCERIN, ondansetron **OR** ondansetron (ZOFRAN) IV, polyethylene glycol  Allergies:    Allergies  Allergen Reactions   Penicillins Hives, Nausea And Vomiting, Swelling and Other (See  Comments)    Tolerated Cefepime Has patient had a PCN reaction causing immediate rash, facial/tongue/throat swelling, SOB or lightheadedness with hypotension: YES Has patient had a PCN reaction causing severe rash involving mucus membranes or skin necrosis: No Has patient had a PCN reaction that required hospitalization No Has patient had a PCN reaction occurring within the last 10 years: No If all of the above answers are "NO", then may proceed with Cephalosporin use.     Social History:   Social History   Socioeconomic History   Marital status: Single    Spouse name: Not on file   Number of children: 0   Years of education: Not on file   Highest education level: Not on file  Occupational History    Employer: DISABLED  Tobacco Use   Smoking status: Former    Packs/day: 1.00    Years: 42.00    Pack years: 42.00    Types: Cigarettes, Pipe    Quit date: 11/10/2010    Years since quitting: 10.4   Smokeless tobacco: Former    Quit date: 10/02/2010  Vaping Use   Vaping Use: Never used  Substance and Sexual Activity   Alcohol use: No    Alcohol/week: 0.0 standard drinks    Comment: " last drink was 2003" 08/11/15   Drug use: No   Sexual activity: Not on file  Other Topics Concern   Not on file  Social History Narrative   Released from prison in 2003 after serving 2 years for indecency with a minor.    He smokes cigarettes greater than 30 years, also smoked a pipe.  Quit tobacco 09/2010.   Denies EtOH and drugs.    Social  Determinants of Health   Financial Resource Strain: Not on file  Food Insecurity: Not on file  Transportation Needs: Not on file  Physical Activity: Not on file  Stress: Not on file  Social Connections: Not on file  Intimate Partner Violence: Not on file     Family History:    Family History  Problem Relation Age of Onset   Heart disease Mother    Hypertension Sister    Heart disease Brother    Heart disease Father    Peripheral vascular disease  Father    Heart disease Maternal Grandmother    Heart attack Maternal Grandmother    Breast cancer Maternal Grandmother    Stomach cancer Maternal Uncle    Colon cancer Neg Hx    Stroke Neg Hx      ROS:  All other ROS reviewed and negative. Pertinent positives noted in the HPI.     Physical Exam/Data:   Vitals:   04/05/21 1400 04/05/21 1430 04/05/21 1445 04/05/21 1500  BP: 99/67 93/69 (!) 103/55 96/76  Pulse: (!) 108 97 (!) 117 (!) 105  Resp: (!) 22 (!) 25 (!) 24 (!) 22  Temp:      TempSrc:      SpO2: 99% 97% 97% 96%    Intake/Output Summary (Last 24 hours) at 04/05/2021 1651 Last data filed at 04/05/2021 1401 Gross per 24 hour  Intake --  Output 800 ml  Net -800 ml    Last 3 Weights 04/02/2021 04/01/2021 04/01/2021  Weight (lbs) 210 lb 15.7 oz 205 lb 4 oz 205 lb 4 oz  Weight (kg) 95.7 kg 93.1 kg 93.1 kg    There is no height or weight on file to calculate BMI.   General: Ill-appearing Head: Atraumatic, normal size  Eyes: PEERLA, EOMI  Neck: Supple, no JVD Endocrine: No thryomegaly Cardiac: Normal S1, S2; irregular rhythm, no murmurs rubs or gallops Lungs: Clear to auscultation bilaterally, no wheezing, rhonchi or rales  Abd: Soft, nontender, no hepatomegaly  Ext: No edema, pulses 2+, warm extremities, right midfoot amputation noted Musculoskeletal: No deformities, BUE and BLE strength normal and equal Skin: Warm and dry, no rashes   Neuro: Alert, awake, oriented to person and place  EKG:  The EKG was personally reviewed and demonstrates: Atrial fibrillation heart rate 99, right bundle branch block with left anterior fascicular block Telemetry:  Telemetry was personally reviewed and demonstrates: Atrial fibrillation with heart rate in the low 100s  Relevant CV Studies: TTE 03/26/2021  1. Left ventricular ejection fraction, by estimation, is 20%. The left  ventricle demonstrates global hypokinesis. The left ventricular internal  cavity size was mildly dilated. Left  ventricular diastolic parameters are  indeterminate.   2. Right ventricular systolic function is moderately reduced. The right  ventricular size is mildly enlarged. Tricuspid regurgitation signal is  inadequate for assessing PA pressure.   3. Left atrial size was mildly dilated.   4. Doppler not assessed. No evidence of mitral stenosis. Moderate mitral  annular calcification.   5. Doppler not assessed. The aortic valve is tricuspid.   6. The inferior vena cava is normal in size with <50% respiratory  variability, suggesting right atrial pressure of 8 mmHg.   7. Limited echo. The patient was tachycardic in atrial fibrillation, rate  in 130s generally.   Laboratory Data: High Sensitivity Troponin:   Recent Labs  Lab 03/23/21 1451 04/04/21 2055 04/04/21 2233 04/05/21 0431 04/05/21 0727  TROPONINIHS 701* 200* 223* 243* 245*  Cardiac EnzymesNo results for input(s): TROPONINI in the last 168 hours. No results for input(s): TROPIPOC in the last 168 hours.  Chemistry Recent Labs  Lab 04/02/21 0332 04/04/21 2055 04/05/21 0431  NA 130* 126* 124*  K 4.6 4.9 5.2*  CL 98 92* 92*  CO2 24 21* 19*  GLUCOSE 84 78 77  BUN 103* 109* 112*  CREATININE 1.80* 1.90* 1.88*  CALCIUM 8.1* 8.1* 7.9*  GFRNONAA 41* 38* 39*  ANIONGAP _0 Recent Labs  Lab 04/04/21 2055  PROT 5.8*  ALBUMIN 1.9*  AST 39  ALT 30  ALKPHOS 71  BILITOT 0.9   Hematology Recent Labs  Lab 03/30/21 0242 04/04/21 2055 04/05/21 0431  WBC 8.7 18.3* 12.7*  RBC 3.62* 3.00* 3.48*  HGB 10.8* 9.2* 10.5*  HCT 33.7* 28.8* 33.8*  MCV 93.1 96.0 97.1  MCH 29.8 30.7 30.2  MCHC 32.0 31.9 31.1  RDW 17.3* 17.1* 17.0*  PLT 213 195 153   BNP Recent Labs  Lab 04/04/21 2055  BNP 731.8*    DDimer No results for input(s): DDIMER in the last 168 hours.  Radiology/Studies:  CT ABDOMEN PELVIS WO CONTRAST  Result Date: 04/05/2021 CLINICAL DATA:  Abdominal pain. EXAM: CT ABDOMEN AND PELVIS WITHOUT CONTRAST  TECHNIQUE: Multidetector CT imaging of the abdomen and pelvis was performed following the standard protocol without IV contrast. COMPARISON:  CT abdomen pelvis dated 09/08/2010. FINDINGS: Evaluation of this exam is limited in the absence of intravenous contrast. Lower chest: Partially visualized large area of consolidation involving the right lung base most consistent with pneumonia. Aspiration or underlying mass is not excluded clinical correlation and follow-up to resolution recommended. Mild cardiomegaly. Three-vessel coronary vascular calcification and postsurgical changes of CABG. Pacemaker wires noted. No intra-abdominal free air or free fluid. Hepatobiliary: No focal liver abnormality is seen. No gallstones, gallbladder wall thickening, or biliary dilatation. Pancreas: Choose mild Spleen: Normal in size without focal abnormality. Adrenals/Urinary Tract: The adrenal glands are unremarkable. The kidneys, visualized ureters, appear unremarkable. The urinary bladder is decompressed around a Foley catheter. Stomach/Bowel: There is a large right ventral hernia containing the majority of the small and large bowel. The neck of the hernia defect measures approximately 12.5 cm in diameter. No evidence of bowel obstruction. Vascular/Lymphatic: Moderate aortoiliac atherosclerotic disease. The IVC is unremarkable. No pain venous gas. There is no adenopathy. Reproductive: The prostate and seminal vesicles are grossly unremarkable. Other: None Musculoskeletal: Osteopenia with scoliosis and degenerative changes. No acute osseous pathology. IMPRESSION: 1. Large right ventral hernia containing the majority of the small and large bowel. No evidence of bowel obstruction. 2. Partially visualized large area of consolidation involving the right lung base most consistent with pneumonia. Aspiration or underlying mass is not excluded. Clinical correlation and follow-up to resolution recommended. 3. Aortic Atherosclerosis  (ICD10-I70.0). Electronically Signed   By: Anner Crete M.D.   On: 04/05/2021 01:09   DG Chest Port 1 View  Result Date: 04/04/2021 CLINICAL DATA:  Chest pain and nausea. EXAM: PORTABLE CHEST 1 VIEW COMPARISON:  March 27, 2021 FINDINGS: The study is limited secondary to patient rotation. Multiple sternal wires are present. A dual lead AICD is noted. Stable, chronic appearing increased interstitial lung markings are seen. There is no evidence of acute infiltrate or pneumothorax. A small right pleural effusion is seen. The cardiac silhouette is enlarged and unchanged in size. Degenerative changes seen throughout the thoracic spine. IMPRESSION: Chronic appearing increased interstitial lung markings with a small right pleural effusion.  Electronically Signed   By: Virgina Norfolk M.D.   On: 04/04/2021 21:57    Assessment and Plan:   Hypotension/right lower lung consolidation -He presents with weakness and fatigue as well as lethargy for the past 2 days.  He had a noticeable leukocytosis with left shift up to 18,000.  Chest x-ray with white lower lobe effusion.  CT abdomen pelvis captured right lower lobe consolidation consistent with pneumonia.  I have reviewed this.  This is not congestive heart failure. -His low diastolic pressure goes with more of a septic process.  He is warm on exam.  He is not in cardiogenic shock.  I have concerns for likely septic shock. -I have ordered procalcitonin's as well as lactic acid.  This could be a mixed picture but I do believe sepsis is likely the primary source here. -Blood cultures are negative thus far.  Urine culture pending. -I will defer antibiotics to the primary team however I do think he merits antibiotics at this time.  His hypotension is new and given his leukocytosis and concerns for consolidation on imaging would recommend to treat this.  2.  Chronic systolic heart failure, EF 20% -Deemed to be an inappropriate candidate for aggressive  cardiovascular care in his last admission.  He cannot be on ACE/ARB/Arni/MRA given significant CKD.  Would hold his beta-blocker in the setting of hypotension.  Work-up for possible sepsis as above. -Not a candidate for digoxin. -He is slightly volume up but I would hold further diuresis until his blood pressure is more stable.  He also merits aggressive goals of care discussion.  He was not a great candidate for any aggressive care during his last admission and he remains this.  3.  Persistent atrial fibrillation -Would allow for lenient rate control in the setting of hypotension.  Procalcitonin is pending.  Work-up for sepsis is pending. -Would hold beta-blocker given hypotension.  Not a candidate digoxin given CKD.  If rates remain elevated we will need to start an amiodarone drip for effective rate control. -Continue Eliquis for now.  4. CAD s/p CABG -Troponin elevation is minimal and flat.  Likely secondary to sepsis and hypotension.  No complaints of angina.  Continue aspirin and statin therapy.  For questions or updates, please contact Melissa Please consult [email or web address] for contact info under    Signed, [place] T. Audie Box, MD, [place]  04/05/2021 4:51 PM

## 2021-04-05 NOTE — ED Notes (Signed)
Per Dr. Darrick Meigs no COVID swab needed for admit since patient just tested positive on 03/23/2021.

## 2021-04-05 NOTE — ED Notes (Signed)
Notified Dr. Jeanice Lim that patient continues to have low blood pressures trending 90s/60s and as low 82/55 as an order for 40 mg of IV Lasix was just placed after consult with cardiology. Per Dr. Jeanice Lim, cardiology was consulted and patient needs to be diuresed due to current fluid overload, the IV Lasix may improve cardiac output and improve BP. IV Lasix given at this time. Will trend patient's blood pressures and continue to monitor.

## 2021-04-05 NOTE — ED Notes (Signed)
Notified by Dr. Darrick Meigs if MAP drops below 65 will start with intervention. No new order at this time.

## 2021-04-05 NOTE — ED Notes (Signed)
Given 4 oz of apple juice for CBG of 72.

## 2021-04-06 DIAGNOSIS — I5023 Acute on chronic systolic (congestive) heart failure: Secondary | ICD-10-CM | POA: Diagnosis not present

## 2021-04-06 DIAGNOSIS — I4819 Other persistent atrial fibrillation: Secondary | ICD-10-CM | POA: Diagnosis not present

## 2021-04-06 DIAGNOSIS — I509 Heart failure, unspecified: Secondary | ICD-10-CM | POA: Diagnosis not present

## 2021-04-06 LAB — CBC WITH DIFFERENTIAL/PLATELET
Abs Immature Granulocytes: 0.05 10*3/uL (ref 0.00–0.07)
Basophils Absolute: 0 10*3/uL (ref 0.0–0.1)
Basophils Relative: 0 %
Eosinophils Absolute: 0.1 10*3/uL (ref 0.0–0.5)
Eosinophils Relative: 1 %
HCT: 27.7 % — ABNORMAL LOW (ref 39.0–52.0)
Hemoglobin: 8.7 g/dL — ABNORMAL LOW (ref 13.0–17.0)
Immature Granulocytes: 0 %
Lymphocytes Relative: 5 %
Lymphs Abs: 0.6 10*3/uL — ABNORMAL LOW (ref 0.7–4.0)
MCH: 29.7 pg (ref 26.0–34.0)
MCHC: 31.4 g/dL (ref 30.0–36.0)
MCV: 94.5 fL (ref 80.0–100.0)
Monocytes Absolute: 1 10*3/uL (ref 0.1–1.0)
Monocytes Relative: 8 %
Neutro Abs: 10.8 10*3/uL — ABNORMAL HIGH (ref 1.7–7.7)
Neutrophils Relative %: 86 %
Platelets: 181 10*3/uL (ref 150–400)
RBC: 2.93 MIL/uL — ABNORMAL LOW (ref 4.22–5.81)
RDW: 17 % — ABNORMAL HIGH (ref 11.5–15.5)
WBC: 12.6 10*3/uL — ABNORMAL HIGH (ref 4.0–10.5)
nRBC: 0 % (ref 0.0–0.2)

## 2021-04-06 LAB — GLUCOSE, CAPILLARY
Glucose-Capillary: 101 mg/dL — ABNORMAL HIGH (ref 70–99)
Glucose-Capillary: 98 mg/dL (ref 70–99)
Glucose-Capillary: 152 mg/dL — ABNORMAL HIGH (ref 70–99)
Glucose-Capillary: 150 mg/dL — ABNORMAL HIGH (ref 70–99)
Glucose-Capillary: 142 mg/dL — ABNORMAL HIGH (ref 70–99)

## 2021-04-06 LAB — BASIC METABOLIC PANEL
Anion gap: 9 (ref 5–15)
BUN: 108 mg/dL — ABNORMAL HIGH (ref 8–23)
CO2: 24 mmol/L (ref 22–32)
Calcium: 7.9 mg/dL — ABNORMAL LOW (ref 8.9–10.3)
Chloride: 93 mmol/L — ABNORMAL LOW (ref 98–111)
Creatinine, Ser: 1.97 mg/dL — ABNORMAL HIGH (ref 0.61–1.24)
GFR, Estimated: 37 mL/min — ABNORMAL LOW (ref >60)
Glucose, Bld: 120 mg/dL — ABNORMAL HIGH (ref 70–99)
Potassium: 4.6 mmol/L (ref 3.5–5.1)
Sodium: 126 mmol/L — ABNORMAL LOW (ref 135–145)

## 2021-04-06 LAB — LACTIC ACID, PLASMA: Lactic Acid, Venous: 0.8 mmol/L (ref 0.5–1.9)

## 2021-04-06 LAB — URINE CULTURE: Culture: NO GROWTH

## 2021-04-06 LAB — MRSA NEXT GEN BY PCR, NASAL: MRSA by PCR Next Gen: DETECTED — AB

## 2021-04-06 LAB — PROCALCITONIN: Procalcitonin: 0.69 ng/mL

## 2021-04-06 MED ORDER — SODIUM CHLORIDE 0.9 % IV SOLN
2.0000 g | Freq: Two times a day (BID) | INTRAVENOUS | Status: DC
  Administered 2021-04-06 – 2021-04-09 (×6): 2 g via INTRAVENOUS
  Filled 2021-04-06 (×6): qty 2

## 2021-04-06 MED ORDER — METOPROLOL SUCCINATE ER 25 MG PO TB24
25.0000 mg | ORAL_TABLET | Freq: Every day | ORAL | Status: DC
  Administered 2021-04-06 – 2021-04-08 (×3): 25 mg via ORAL
  Filled 2021-04-06 (×3): qty 1

## 2021-04-06 MED ORDER — IPRATROPIUM-ALBUTEROL 20-100 MCG/ACT IN AERS
1.0000 | INHALATION_SPRAY | Freq: Four times a day (QID) | RESPIRATORY_TRACT | Status: DC | PRN
  Filled 2021-04-06: qty 4

## 2021-04-06 MED ORDER — METRONIDAZOLE 500 MG PO TABS
500.0000 mg | ORAL_TABLET | Freq: Three times a day (TID) | ORAL | Status: DC
  Administered 2021-04-06 – 2021-04-09 (×9): 500 mg via ORAL
  Filled 2021-04-06 (×9): qty 1

## 2021-04-06 MED ORDER — VANCOMYCIN HCL 1250 MG/250ML IV SOLN: 1250.0000 mg | INTRAVENOUS | Status: DC

## 2021-04-06 MED ORDER — FUROSEMIDE 10 MG/ML IJ SOLN
60.0000 mg | Freq: Two times a day (BID) | INTRAMUSCULAR | Status: DC
  Administered 2021-04-06 (×2): 60 mg via INTRAVENOUS
  Filled 2021-04-06 (×2): qty 6

## 2021-04-06 MED ORDER — VANCOMYCIN HCL 1750 MG/350ML IV SOLN
1750.0000 mg | Freq: Once | INTRAVENOUS | Status: AC
  Administered 2021-04-06: 1750 mg via INTRAVENOUS
  Filled 2021-04-06: qty 350

## 2021-04-06 NOTE — Plan of Care (Signed)
Chest CT from 8/29 significant for persistent RLL infiltrate that was initially noted on xray last admission.  I reviewed his antibiotic course from his last admission: Rocephin 8/16-8/20 Doxycycline 8/16-8/21 He additionally received a 5 day course of remdesivir for COVID 19.  CT chest was reviewed with radiology this afternoon. Although there is not a prior CT for comparison, findings on yesterday's CT in combination with CXR, raise suspicion for a persistent pneumonia.   Given the elevation is procal to 0.7, in addition to comparison of films and CT findings, will restart antibiotic treatment with vanc, cefepime and flagyl. Can discontinue vancomycin if MRSA PCR is negative. He will need follow up imaging in 6-8 weeks to rule out underlying malignancy. Can consider bronchoscopy if infiltrate fails treatment.  Mitzi Hansen, MD Internal Medicine Resident PGY-3 [facility] Pager: [phone] 04/06/2021 5:20 PM

## 2021-04-06 NOTE — Progress Notes (Addendum)
Subjective: I seen and evaluated [name] at bedside. He was lying comfortably in bed. He is not ill-appearing or in any acute respiratory distress. He reports fatigue. He states that he is no longer experiencing N/V. He was eating ice chips and reports appetite. He is on 3L O2 [place] and reports no SOB. He denies chest pain; abd pain; or LE discomfort.  Objective:  Vital signs in last 24 hours: Vitals:   04/06/21 0130 04/06/21 0300 04/06/21 0419 04/06/21 0759  BP: (!) 114/94  (!) 97/57 102/63  Pulse: 66  65 90  Resp: (!) '22  20 16  '$ Temp: 98.6 F (37 C)  98.5 F (36.9 C) 97.9 F (36.6 C)  TempSrc: Oral  Oral Oral  SpO2: 92%  93% 96%  Weight:  88.7 kg    Height:  5' (1.524 m)     Physical Exam Constitutional:      Appearance: He is not ill-appearing or toxic-appearing.     Interventions: Nasal cannula in place.  HENT:     Head: Normocephalic and atraumatic.  Cardiovascular:     Rate and Rhythm: Normal rate.     Heart sounds: Heart sounds are distant.     Comments: Improved from yesterday Pulmonary:     Effort: Pulmonary effort is normal. No accessory muscle usage, respiratory distress or retractions.     Breath sounds: Decreased breath sounds and rhonchi present.  Abdominal:     General: Abdomen is protuberant.     Palpations: Abdomen is rigid.     Tenderness: There is no abdominal tenderness.  Genitourinary:    Comments: Foley present Musculoskeletal:     Right lower leg: 1+ Edema present.     Left lower leg: 1+ Edema present.     Comments: Bilateral arms no longer weeping; non edematous  Skin:    General: Skin is warm and dry.     Findings: Bruising and ecchymosis present.     Comments: Noted along bilateral arms and left thigh  Neurological:     Mental Status: He is alert. Mental status is at baseline.  Psychiatric:        Behavior: Behavior is cooperative.     Assessment/Plan:  Active Problems:   Acute exacerbation of CHF (congestive heart failure)  ([place])  [name] is a 65 y.o. 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  Acute on chronic combined systolic and diastolic heart failure Patient has received 2 doses of IV lasix '40mg'$  since admission. On PE, LE edema has improved, nonpitting +1 edema (pitting edema present yesterday). Due to soft pressures, 90s over 60s, holding diuresis until BP improves. --Strict I&Os, daily weights --Trend electrolytes, BMP   Possible Sepsis,  On admission, patient presented with leukocytosis (wbc 18.3 w neutrophil predom); hypotension and CT of the chest revealed right small pleural infusion, partially loculated. CT of abd/pelvis revealed area of consolidation of right lung base consistent with pneumonia. These imaging findings are unchanged from previous imaging on previous hospitalization 03/23/21-04/02/21. During said prior admission patient tested positive for COVID, he received a 7 day course of dexamethasone and 10 day quarantine. Patient was also found to have community acquired pneumonia revealed on  imaging, right lower lobe consolidation and treated with 5 day course of Rocephin. Patient was then discharged on 04/02/21 in stable condition. Since this admission 04/04/21, patient has remained Afebrile,  initial labs in the ED revealed WBC 18.3 which could be reactive due to presenting with N/V in addition to volume overload 2/2 HF exacerbation. White count has been trending down in the absence of antibiotic administration (current WBC 12.6). Blood cultures show no growth; urine culture shows no growth; UA unimpressive, mod Hgb and >50 which can be  explained by the presence of foley ; lactic acid levels WNL; procalcitonin WNL at .69. Antibiotics not indicated at this time. Hypotension in the setting of HF exacerbation and diuresis --Continue to trend CBC   #AKI on CKD 3B #Hyponatremia Sodium level at 126 and Creatinine at 1.97 (baseline 1.8-1.9). AKI and hyponatremia likely prerenal in the setting of heart failure exacerbation. --Repeat BMP in the AM --Strict I&Os, daily weights --Avoid nephrotoxic agents  #CAD s/p CABG 2017 #Bilateral CAS s/p L carotic endarterectomy 2017 #Troponinemia Patient denies chest pain at this time. Trops flat 200>>245 --PRN nitro  --ASA '81mg'$  daily --atorvastatin '80mg'$  daily  T2DM with peripheral neuropathy --SSI, moderate --Q4H CBG checks --Resume Lantus when diet tolerated  Urinary retention --Continue foley --Continue Flomax 0.4 mg daily  Medication Adverse Effect, Metformin, Resolved Patient is no longer experiencing N/V. --Metformin Held --Advance diet as tolerated  Prior to Admission Living Arrangement: Anticipated Discharge Location: Barriers to Discharge: Dispo: Anticipated discharge in approximately 2-3 day(s).   Timothy Lasso, MD 04/06/2021, 8:50 AM Pager: [phone] After 5pm on weekdays and 1pm on weekends: On Call pager [phone]

## 2021-04-06 NOTE — Plan of Care (Signed)
  Problem: Clinical Measurements: Goal: Respiratory complications will improve Outcome: Progressing   Problem: Safety: Goal: Ability to remain free from injury will improve Outcome: Progressing   

## 2021-04-06 NOTE — Progress Notes (Signed)
Pharmacy Antibiotic Note  [name] is a 65 y.o. male admitted on 04/04/2021 with HF, nausea and AKI. There is concern for PNA and pharmacy consulted to dose cefepime and vancomycin -chest CT with multifocal pneumonia  -WBC= 12.6, afebrile -SCr= 1.8 (recently 1.9-2.0)   Plan: -Cefepime 2gm IV q12h -Vancomycin '1750mg'$  IV x1 followed by '1250mg'$  IV q48hr (Estimated AUC= 505) -Will follow renal function, cultures and clinical progress   Height: 5' (152.4 cm) Weight: 88.7 kg (195 lb 8.8 oz) IBW/kg (Calculated) : 50  Temp (24hrs), Avg:98.5 F (36.9 C), Min:97.9 F (36.6 C), Max:98.8 F (37.1 C)  Recent Labs  Lab 04/01/21 0124 04/02/21 0332 04/04/21 2055 04/04/21 2233 04/05/21 0431 04/06/21 0013  WBC  --   --  18.3*  --  12.7* 12.6*  CREATININE 1.87* 1.80* 1.90*  --  1.88* 1.97*  LATICACIDVEN  --   --  1.1 0.9  --  0.8    Estimated Creatinine Clearance: 34.2 mL/min (A) (by C-G formula based on SCr of 1.97 mg/dL (H)).    Allergies  Allergen Reactions   Penicillins Hives, Nausea And Vomiting, Swelling and Other (See Comments)    Tolerated Cefepime Has patient had a PCN reaction causing immediate rash, facial/tongue/throat swelling, SOB or lightheadedness with hypotension: YES Has patient had a PCN reaction causing severe rash involving mucus membranes or skin necrosis: No Has patient had a PCN reaction that required hospitalization No Has patient had a PCN reaction occurring within the last 10 years: No If all of the above answers are "NO", then may proceed with Cephalosporin use.      Thank you for allowing pharmacy to be a part of this patient's care.  Hildred Laser, PharmD Clinical Pharmacist **Pharmacist phone directory can now be found on [email or web address] ([email or web address]).  Listed under [facility].

## 2021-04-06 NOTE — Progress Notes (Addendum)
Pt arrived to unit. Cardiac monitoring initiated, vitals taken, and airborne/contact precautions resumed due to positive covid test on 8/16 with continued cough. Foley care completed and skin care completed. Pt has many scattered abrasions and bruising from previous fall (last Tuesday) as well as some scattered pressure wounds. See charting.

## 2021-04-06 NOTE — Progress Notes (Signed)
Cardiology Progress Note  Patient ID: [name] MRN: [id number] DOB: 1956/04/03 Date of Encounter: 04/06/2021  Primary Cardiologist: Mertie Moores, MD  Subjective   Chief Complaint: Cough  HPI: Chest CT with concerns for pneumonia.  Good diuresis overnight.  Creatinine stable.  ROS:  All other ROS reviewed and negative. Pertinent positives noted in the HPI.     Inpatient Medications  Scheduled Meds:  apixaban  5 mg Oral BID   aspirin EC  81 mg Oral Daily   atorvastatin  80 mg Oral Daily   Chlorhexidine Gluconate Cloth  6 each Topical Daily   insulin aspart  0-20 Units Subcutaneous Q4H   nystatin  1 application Topical TID   pantoprazole  40 mg Oral Daily   PARoxetine  40 mg Oral Daily   senna  1 tablet Oral BID   tamsulosin  0.4 mg Oral Daily   umeclidinium bromide  1 puff Inhalation Daily   Continuous Infusions:  PRN Meds: acetaminophen **OR** acetaminophen, ipratropium-albuterol, nitroGLYCERIN, ondansetron **OR** ondansetron (ZOFRAN) IV, polyethylene glycol   Vital Signs   Vitals:   04/06/21 0130 04/06/21 0300 04/06/21 0419 04/06/21 0759  BP: (!) 114/94  (!) 97/57 102/63  Pulse: 66  65 90  Resp: (!) '22  20 16  '$ Temp: 98.6 F (37 C)  98.5 F (36.9 C) 97.9 F (36.6 C)  TempSrc: Oral  Oral Oral  SpO2: 92%  93% 96%  Weight:  88.7 kg    Height:  5' (1.524 m)      Intake/Output Summary (Last 24 hours) at 04/06/2021 0946 Last data filed at 04/06/2021 0919 Gross per 24 hour  Intake 480 ml  Output 1500 ml  Net -1020 ml   Last 3 Weights 04/06/2021 04/02/2021 04/01/2021  Weight (lbs) 195 lb 8.8 oz 210 lb 15.7 oz 205 lb 4 oz  Weight (kg) 88.7 kg 95.7 kg 93.1 kg      Telemetry  Overnight telemetry shows atrial fibrillation in the low 100s, which I personally reviewed.   ECG  The most recent ECG shows atrial fibrillation heart rate 99, right bundle branch block with left anterior fascicular block., which I personally reviewed.   Physical Exam   Vitals:    04/06/21 0130 04/06/21 0300 04/06/21 0419 04/06/21 0759  BP: (!) 114/94  (!) 97/57 102/63  Pulse: 66  65 90  Resp: (!) '22  20 16  '$ Temp: 98.6 F (37 C)  98.5 F (36.9 C) 97.9 F (36.6 C)  TempSrc: Oral  Oral Oral  SpO2: 92%  93% 96%  Weight:  88.7 kg    Height:  5' (1.524 m)      Intake/Output Summary (Last 24 hours) at 04/06/2021 0946 Last data filed at 04/06/2021 0919 Gross per 24 hour  Intake 480 ml  Output 1500 ml  Net -1020 ml    Last 3 Weights 04/06/2021 04/02/2021 04/01/2021  Weight (lbs) 195 lb 8.8 oz 210 lb 15.7 oz 205 lb 4 oz  Weight (kg) 88.7 kg 95.7 kg 93.1 kg    Body mass index is 38.19 kg/m.   General: Well nourished, well developed, in no acute distress Head: Atraumatic, normal size  Eyes: PEERLA, EOMI  Neck: Supple, JVD 7 to 8 cm water Endocrine: No thryomegaly Cardiac: Normal S1, S2; irregular rhythm, no murmurs Lungs: Diminished breath sounds, rhonchi in the right lung fields Abd: Soft, nontender, no hepatomegaly  Ext: No edema, pulses 2+ Musculoskeletal: No deformities, BUE and BLE strength normal and equal Skin: Warm  and dry, no rashes   Neuro: Alert and oriented to person, place, time, and situation, CNII-XII grossly intact, no focal deficits  Psych: Normal mood and affect   Labs  High Sensitivity Troponin:   Recent Labs  Lab 03/23/21 1451 04/04/21 2055 04/04/21 2233 04/05/21 0431 04/05/21 0727  TROPONINIHS 701* 200* 223* 243* 245*     Cardiac EnzymesNo results for input(s): TROPONINI in the last 168 hours. No results for input(s): TROPIPOC in the last 168 hours.  Chemistry Recent Labs  Lab 04/04/21 2055 04/05/21 0431 04/06/21 0013  NA 126* 124* 126*  K 4.9 5.2* 4.6  CL 92* 92* 93*  CO2 21* 19* 24  GLUCOSE 78 77 120*  BUN 109* 112* 108*  CREATININE 1.90* 1.88* 1.97*  CALCIUM 8.1* 7.9* 7.9*  PROT 5.8*  --   --   ALBUMIN 1.9*  --   --   AST 39  --   --   ALT 30  --   --   ALKPHOS 71  --   --   BILITOT 0.9  --   --   GFRNONAA 38* 39*  37*  ANIONGAP '13 13 9    '$ Hematology Recent Labs  Lab 04/04/21 2055 04/05/21 0431 04/06/21 0013  WBC 18.3* 12.7* 12.6*  RBC 3.00* 3.48* 2.93*  HGB 9.2* 10.5* 8.7*  HCT 28.8* 33.8* 27.7*  MCV 96.0 97.1 94.5  MCH 30.7 30.2 29.7  MCHC 31.9 31.1 31.4  RDW 17.1* 17.0* 17.0*  PLT 195 153 181   BNP Recent Labs  Lab 04/04/21 2055  BNP 731.8*    DDimer No results for input(s): DDIMER in the last 168 hours.   Radiology  CT ABDOMEN PELVIS WO CONTRAST  Result Date: 04/05/2021 CLINICAL DATA:  Abdominal pain. EXAM: CT ABDOMEN AND PELVIS WITHOUT CONTRAST TECHNIQUE: Multidetector CT imaging of the abdomen and pelvis was performed following the standard protocol without IV contrast. COMPARISON:  CT abdomen pelvis dated 09/08/2010. FINDINGS: Evaluation of this exam is limited in the absence of intravenous contrast. Lower chest: Partially visualized large area of consolidation involving the right lung base most consistent with pneumonia. Aspiration or underlying mass is not excluded clinical correlation and follow-up to resolution recommended. Mild cardiomegaly. Three-vessel coronary vascular calcification and postsurgical changes of CABG. Pacemaker wires noted. No intra-abdominal free air or free fluid. Hepatobiliary: No focal liver abnormality is seen. No gallstones, gallbladder wall thickening, or biliary dilatation. Pancreas: Choose mild Spleen: Normal in size without focal abnormality. Adrenals/Urinary Tract: The adrenal glands are unremarkable. The kidneys, visualized ureters, appear unremarkable. The urinary bladder is decompressed around a Foley catheter. Stomach/Bowel: There is a large right ventral hernia containing the majority of the small and large bowel. The neck of the hernia defect measures approximately 12.5 cm in diameter. No evidence of bowel obstruction. Vascular/Lymphatic: Moderate aortoiliac atherosclerotic disease. The IVC is unremarkable. No pain venous gas. There is no adenopathy.  Reproductive: The prostate and seminal vesicles are grossly unremarkable. Other: None Musculoskeletal: Osteopenia with scoliosis and degenerative changes. No acute osseous pathology. IMPRESSION: 1. Large right ventral hernia containing the majority of the small and large bowel. No evidence of bowel obstruction. 2. Partially visualized large area of consolidation involving the right lung base most consistent with pneumonia. Aspiration or underlying mass is not excluded. Clinical correlation and follow-up to resolution recommended. 3. Aortic Atherosclerosis (ICD10-I70.0). Electronically Signed   By: Anner Crete M.D.   On: 04/05/2021 01:09   CT CHEST WO CONTRAST  Result Date: 04/05/2021 CLINICAL  DATA:  Pneumonia suspected on CT abdomen/pelvis EXAM: CT CHEST WITHOUT CONTRAST TECHNIQUE: Multidetector CT imaging of the chest was performed following the standard protocol without IV contrast. COMPARISON:  Partial comparison to CT abdomen/pelvis dated 04/05/2021 FINDINGS: Cardiovascular: Mild cardiomegaly.  No pericardial effusion. Atherosclerotic calcifications of the aortic arch. No evidence of thoracic aortic aneurysm. Three vessel coronary atherosclerosis. Postsurgical changes related to prior CABG. Left subclavian pacemaker. Mediastinum/Nodes: Small mediastinal lymph nodes, including a partially calcified/hyperdense 16 mm right azygoesophageal recess node (series 3/image 30), favored to be reactive. Visualized thyroid is unremarkable. Lungs/Pleura: Multifocal patchy opacities in the posterior right middle lobe and throughout the right lower lobe, reflecting pneumonia. Moderate centrilobular and paraseptal emphysematous changes, upper lung predominant. Two 3 mm nodules in the posterior left upper lobe (series 8/image 77), nonspecific. Small right pleural effusion, partially loculated. No pneumothorax. Upper Abdomen: Visualized upper abdomen is better evaluated on recent CT. Musculoskeletal: Degenerative changes  of the visualized thoracolumbar spine. Median sternotomy. Right anterolateral 3rd, 4th, and 7th rib fracture deformities, favored to be chronic. Right lateral 5th and 6th ribs are also mildly irregular. IMPRESSION: Multifocal pneumonia involving the right middle and lower lobes. Follow-up CT chest is suggested in 6-12 weeks to document clearance. Small right pleural effusion, partially loculated. Mild mediastinal lymphadenopathy, favored to be reactive. Two 3 mm nodules in the posterior left upper lobe, nonspecific. Attention on follow-up is suggested. Aortic Atherosclerosis (ICD10-I70.0) and Emphysema (ICD10-J43.9). Electronically Signed   By: Julian Hy M.D.   On: 04/05/2021 20:14   DG Chest Port 1 View  Result Date: 04/04/2021 CLINICAL DATA:  Chest pain and nausea. EXAM: PORTABLE CHEST 1 VIEW COMPARISON:  March 27, 2021 FINDINGS: The study is limited secondary to patient rotation. Multiple sternal wires are present. A dual lead AICD is noted. Stable, chronic appearing increased interstitial lung markings are seen. There is no evidence of acute infiltrate or pneumothorax. A small right pleural effusion is seen. The cardiac silhouette is enlarged and unchanged in size. Degenerative changes seen throughout the thoracic spine. IMPRESSION: Chronic appearing increased interstitial lung markings with a small right pleural effusion. Electronically Signed   By: Virgina Norfolk M.D.   On: 04/04/2021 21:57    Cardiac Studies  TTE 03/26/2021  1. Left ventricular ejection fraction, by estimation, is 20%. The left  ventricle demonstrates global hypokinesis. The left ventricular internal  cavity size was mildly dilated. Left ventricular diastolic parameters are  indeterminate.   2. Right ventricular systolic function is moderately reduced. The right  ventricular size is mildly enlarged. Tricuspid regurgitation signal is  inadequate for assessing PA pressure.   3. Left atrial size was mildly dilated.    4. Doppler not assessed. No evidence of mitral stenosis. Moderate mitral  annular calcification.   5. Doppler not assessed. The aortic valve is tricuspid.   6. The inferior vena cava is normal in size with <50% respiratory  variability, suggesting right atrial pressure of 8 mmHg.   7. Limited echo. The patient was tachycardic in atrial fibrillation, rate  in 130s generally.   Patient Profile  [name] is a 65 y.o. male with CAD status post CABG, persistent atrial fibrillation, recent bradycardic arrest status post CRT-P, systolic heart failure (EF 20%, ICD referred), carotid artery disease status post left CEA, diabetes, developmental delay who was admitted on 04/05/2021 with nausea vomiting as well as volume overload concerning for acute on chronic systolic heart failure.  Assessment & Plan   Right lower lung consolidation/likely pneumonia -CT  scan with concerns for focal consolidation in the right lower lobe.  Appears to be pneumonia.  Would recommend treatment per primary medical team.  Procalcitonin is minimally elevated.  White count is up.  2.  Acute on chronic systolic heart failure, EF 20% -Deemed not to be an appropriate candidate for aggressive cardiovascular care during last admission.  I agree with this.  He is bedridden with ulcers and wounds in the lower extremities.  He was discharged home to SNF. -Beta-blocker held yesterday.  BP improved with diuresis.  Sodium improved with diuresis. -We will continue with IV diuresis today.  We will resume Lasix 60 mg IV twice daily. -Blood pressure is improved.  We will start him on metoprolol succinate 25 mg daily. -Not a candidate for ACE/ARB/Arni/MRA given significant CKD.  Likely will start him on hydralazine and Imdur as we are able. -He does have CRT-P.  Not a candidate for ICD.  Deemed not to be a candidate for invasive angiography given CKD as well.  I agree with this.  3.  Persistent atrial fibrillation -Starting metoprolol  succinate 25 mg daily. -Continue Eliquis.  4.  Hyponatremia -Suspect hypervolemia related.  Improving with diuresis.  5.  Elevated troponin -Secondary to heart failure and likely hypotension.  No complaints of angina. -Continue aspirin and statin.  For questions or updates, please contact [facility] Please consult [email or web address] for contact info under   Time Spent with Patient: I have spent a total of 35 minutes with patient reviewing hospital notes, telemetry, EKGs, labs and examining the patient as well as establishing an assessment and plan that was discussed with the patient.  > 50% of time was spent in direct patient care.    Signed, Addison Naegeli. Audie Box, MD, [place]  04/06/2021 9:46 AM

## 2021-04-06 NOTE — Social Work (Signed)
Pt will DC to [facility] when medically stable for DC and transported by [facility].

## 2021-04-07 DIAGNOSIS — J189 Pneumonia, unspecified organism: Secondary | ICD-10-CM

## 2021-04-07 DIAGNOSIS — I5023 Acute on chronic systolic (congestive) heart failure: Secondary | ICD-10-CM | POA: Diagnosis not present

## 2021-04-07 DIAGNOSIS — I509 Heart failure, unspecified: Secondary | ICD-10-CM | POA: Diagnosis not present

## 2021-04-07 LAB — CBC WITH DIFFERENTIAL/PLATELET
Abs Immature Granulocytes: 0.06 10*3/uL (ref 0.00–0.07)
Basophils Absolute: 0 10*3/uL (ref 0.0–0.1)
Basophils Relative: 0 %
Eosinophils Absolute: 0.1 10*3/uL (ref 0.0–0.5)
Eosinophils Relative: 1 %
HCT: 27.1 % — ABNORMAL LOW (ref 39.0–52.0)
Hemoglobin: 8.5 g/dL — ABNORMAL LOW (ref 13.0–17.0)
Immature Granulocytes: 1 %
Lymphocytes Relative: 5 %
Lymphs Abs: 0.5 10*3/uL — ABNORMAL LOW (ref 0.7–4.0)
MCH: 29.8 pg (ref 26.0–34.0)
MCHC: 31.4 g/dL (ref 30.0–36.0)
MCV: 95.1 fL (ref 80.0–100.0)
Monocytes Absolute: 0.9 10*3/uL (ref 0.1–1.0)
Monocytes Relative: 10 %
Neutro Abs: 8.2 10*3/uL — ABNORMAL HIGH (ref 1.7–7.7)
Neutrophils Relative %: 83 %
Platelets: 162 10*3/uL (ref 150–400)
RBC: 2.85 MIL/uL — ABNORMAL LOW (ref 4.22–5.81)
RDW: 17.1 % — ABNORMAL HIGH (ref 11.5–15.5)
WBC: 9.8 10*3/uL (ref 4.0–10.5)
nRBC: 0 % (ref 0.0–0.2)

## 2021-04-07 LAB — CBC
HCT: 27 % — ABNORMAL LOW (ref 39.0–52.0)
Hemoglobin: 8.4 g/dL — ABNORMAL LOW (ref 13.0–17.0)
MCH: 29.8 pg (ref 26.0–34.0)
MCHC: 31.1 g/dL (ref 30.0–36.0)
MCV: 95.7 fL (ref 80.0–100.0)
Platelets: 189 10*3/uL (ref 150–400)
RBC: 2.82 MIL/uL — ABNORMAL LOW (ref 4.22–5.81)
RDW: 16.7 % — ABNORMAL HIGH (ref 11.5–15.5)
WBC: 10.1 10*3/uL (ref 4.0–10.5)
nRBC: 0 % (ref 0.0–0.2)

## 2021-04-07 LAB — PROTIME-INR
INR: 2.1 — ABNORMAL HIGH (ref 0.8–1.2)
Prothrombin Time: 23.6 seconds — ABNORMAL HIGH (ref 11.4–15.2)

## 2021-04-07 LAB — BASIC METABOLIC PANEL
Anion gap: 11 (ref 5–15)
BUN: 95 mg/dL — ABNORMAL HIGH (ref 8–23)
CO2: 23 mmol/L (ref 22–32)
Calcium: 7.9 mg/dL — ABNORMAL LOW (ref 8.9–10.3)
Chloride: 93 mmol/L — ABNORMAL LOW (ref 98–111)
Creatinine, Ser: 1.61 mg/dL — ABNORMAL HIGH (ref 0.61–1.24)
GFR, Estimated: 47 mL/min — ABNORMAL LOW (ref >60)
Glucose, Bld: 147 mg/dL — ABNORMAL HIGH (ref 70–99)
Potassium: 4.2 mmol/L (ref 3.5–5.1)
Sodium: 127 mmol/L — ABNORMAL LOW (ref 135–145)

## 2021-04-07 LAB — GLUCOSE, CAPILLARY
Glucose-Capillary: 108 mg/dL — ABNORMAL HIGH (ref 70–99)
Glucose-Capillary: 115 mg/dL — ABNORMAL HIGH (ref 70–99)
Glucose-Capillary: 120 mg/dL — ABNORMAL HIGH (ref 70–99)
Glucose-Capillary: 136 mg/dL — ABNORMAL HIGH (ref 70–99)
Glucose-Capillary: 151 mg/dL — ABNORMAL HIGH (ref 70–99)
Glucose-Capillary: 152 mg/dL — ABNORMAL HIGH (ref 70–99)
Glucose-Capillary: 159 mg/dL — ABNORMAL HIGH (ref 70–99)

## 2021-04-07 LAB — APTT: aPTT: 51 seconds — ABNORMAL HIGH (ref 24–36)

## 2021-04-07 LAB — PROCALCITONIN: Procalcitonin: 0.44 ng/mL

## 2021-04-07 MED ORDER — ISOSORBIDE MONONITRATE ER 30 MG PO TB24
15.0000 mg | ORAL_TABLET | Freq: Every day | ORAL | Status: DC
  Administered 2021-04-07 – 2021-04-08 (×2): 15 mg via ORAL
  Filled 2021-04-07 (×2): qty 1

## 2021-04-07 MED ORDER — BUDESONIDE 0.5 MG/2ML IN SUSP
0.5000 mg | Freq: Two times a day (BID) | RESPIRATORY_TRACT | Status: DC
  Administered 2021-04-07 – 2021-04-09 (×4): 0.5 mg via RESPIRATORY_TRACT
  Filled 2021-04-07 (×4): qty 2

## 2021-04-07 MED ORDER — APIXABAN 5 MG PO TABS: 5.0000 mg | ORAL_TABLET | Freq: Two times a day (BID) | ORAL | Status: DC

## 2021-04-07 MED ORDER — IPRATROPIUM-ALBUTEROL 0.5-2.5 (3) MG/3ML IN SOLN
3.0000 mL | Freq: Four times a day (QID) | RESPIRATORY_TRACT | Status: DC
  Administered 2021-04-07 – 2021-04-09 (×9): 3 mL via RESPIRATORY_TRACT
  Filled 2021-04-07 (×9): qty 3

## 2021-04-07 MED ORDER — MUPIROCIN 2 % EX OINT
1.0000 | TOPICAL_OINTMENT | Freq: Two times a day (BID) | CUTANEOUS | Status: AC
Start: 2021-04-07 — End: 2021-04-12
  Administered 2021-04-07 – 2021-04-11 (×9): 1 via NASAL
  Filled 2021-04-07 (×2): qty 22

## 2021-04-07 MED ORDER — FUROSEMIDE 10 MG/ML IJ SOLN
80.0000 mg | Freq: Two times a day (BID) | INTRAMUSCULAR | Status: DC
  Administered 2021-04-07 (×2): 80 mg via INTRAVENOUS
  Filled 2021-04-07 (×3): qty 8

## 2021-04-07 MED ORDER — HYDRALAZINE HCL 10 MG PO TABS
10.0000 mg | ORAL_TABLET | Freq: Three times a day (TID) | ORAL | Status: DC
  Administered 2021-04-07 – 2021-04-08 (×3): 10 mg via ORAL
  Filled 2021-04-07 (×3): qty 1

## 2021-04-07 MED ORDER — VANCOMYCIN HCL 1250 MG/250ML IV SOLN: 1250.0000 mg | INTRAVENOUS | Status: DC

## 2021-04-07 MED ORDER — REVEFENACIN 175 MCG/3ML IN SOLN: 175.0000 ug | Freq: Every day | RESPIRATORY_TRACT | Status: DC

## 2021-04-07 NOTE — Progress Notes (Signed)
Cardiology Progress Note  Patient ID: [name] MRN: [id number] DOB: [birth year] Date of Encounter: 04/07/2021  Primary Cardiologist: Mertie Moores, MD  Subjective   Chief Complaint: Cough  HPI: Started on antibiotics for pneumonia.  Fair diuresis overnight.  Sodium improving.  Creatinine improving.  ROS:  All other ROS reviewed and negative. Pertinent positives noted in the HPI.     Inpatient Medications  Scheduled Meds:  apixaban  5 mg Oral BID   aspirin EC  81 mg Oral Daily   atorvastatin  80 mg Oral Daily   Chlorhexidine Gluconate Cloth  6 each Topical Daily   furosemide  80 mg Intravenous BID   insulin aspart  0-20 Units Subcutaneous Q4H   metoprolol succinate  25 mg Oral Daily   metroNIDAZOLE  500 mg Oral Q8H   nystatin  1 application Topical TID   pantoprazole  40 mg Oral Daily   PARoxetine  40 mg Oral Daily   senna  1 tablet Oral BID   tamsulosin  0.4 mg Oral Daily   umeclidinium bromide  1 puff Inhalation Daily   Continuous Infusions:  ceFEPime (MAXIPIME) IV 2 g (04/07/21 0524)   [START ON 04/08/2021] vancomycin     PRN Meds: acetaminophen **OR** acetaminophen, Ipratropium-Albuterol, nitroGLYCERIN, ondansetron **OR** ondansetron (ZOFRAN) IV, polyethylene glycol   Vital Signs   Vitals:   04/06/21 1138 04/06/21 2038 04/07/21 0030 04/07/21 0343  BP: (!) 110/59 (!) 101/58 104/68 (!) 134/116  Pulse: 72 74 77 77  Resp: '19 19 19 18  '$ Temp: 98.8 F (37.1 C) 98.4 F (36.9 C) 98.3 F (36.8 C) (!) 97.4 F (36.3 C)  TempSrc: Oral Oral Oral Oral  SpO2: 97% 100% 100% 92%  Weight:   88.8 kg   Height:        Intake/Output Summary (Last 24 hours) at 04/07/2021 0955 Last data filed at 04/07/2021 0915 Gross per 24 hour  Intake 1526.27 ml  Output 2850 ml  Net -1323.73 ml   Last 3 Weights 04/07/2021 04/06/2021 04/02/2021  Weight (lbs) 195 lb 12.3 oz 195 lb 8.8 oz 210 lb 15.7 oz  Weight (kg) 88.8 kg 88.7 kg 95.7 kg      Telemetry  Overnight telemetry shows  atrial fibrillation heart rate in the 90s, intermittent pacing, which I personally reviewed.   Physical Exam   Vitals:   04/06/21 1138 04/06/21 2038 04/07/21 0030 04/07/21 0343  BP: (!) 110/59 (!) 101/58 104/68 (!) 134/116  Pulse: 72 74 77 77  Resp: '19 19 19 18  '$ Temp: 98.8 F (37.1 C) 98.4 F (36.9 C) 98.3 F (36.8 C) (!) 97.4 F (36.3 C)  TempSrc: Oral Oral Oral Oral  SpO2: 97% 100% 100% 92%  Weight:   88.8 kg   Height:        Intake/Output Summary (Last 24 hours) at 04/07/2021 0955 Last data filed at 04/07/2021 0915 Gross per 24 hour  Intake 1526.27 ml  Output 2850 ml  Net -1323.73 ml    Last 3 Weights 04/07/2021 04/06/2021 04/02/2021  Weight (lbs) 195 lb 12.3 oz 195 lb 8.8 oz 210 lb 15.7 oz  Weight (kg) 88.8 kg 88.7 kg 95.7 kg    Body mass index is 38.23 kg/m.   General: Well nourished, well developed, in no acute distress Head: Atraumatic, normal size  Eyes: PEERLA, EOMI  Neck: Supple, no JVD Endocrine: No thryomegaly Cardiac: Normal S1, S2; irregular rhythm, no murmur Lungs: Crackles bilaterally, rales at the lung bases Abd: Distended abdomen Ext: No  edema, pulses 2+ Musculoskeletal: No deformities, BUE and BLE strength normal and equal Skin: Warm and dry, no rashes   Neuro: Alert and oriented to person, place, time, and situation, CNII-XII grossly intact, no focal deficits  Psych: Normal mood and affect   Labs  High Sensitivity Troponin:   Recent Labs  Lab 03/23/21 1451 04/04/21 2055 04/04/21 2233 04/05/21 0431 04/05/21 0727  TROPONINIHS 701* 200* 223* 243* 245*     Cardiac EnzymesNo results for input(s): TROPONINI in the last 168 hours. No results for input(s): TROPIPOC in the last 168 hours.  Chemistry Recent Labs  Lab 04/04/21 2055 04/05/21 0431 04/06/21 0013 04/07/21 0334  NA 126* 124* 126* 127*  K 4.9 5.2* 4.6 4.2  CL 92* 92* 93* 93*  CO2 21* 19* 24 23  GLUCOSE 78 77 120* 147*  BUN 109* 112* 108* 95*  CREATININE 1.90* 1.88* 1.97* 1.61*   CALCIUM 8.1* 7.9* 7.9* 7.9*  PROT 5.8*  --   --   --   ALBUMIN 1.9*  --   --   --   AST 39  --   --   --   ALT 30  --   --   --   ALKPHOS 71  --   --   --   BILITOT 0.9  --   --   --   GFRNONAA 38* 39* 37* 47*  ANIONGAP '13 13 9 11    '$ Hematology Recent Labs  Lab 04/05/21 0431 04/06/21 0013 04/07/21 0334  WBC 12.7* 12.6* 9.8  RBC 3.48* 2.93* 2.85*  HGB 10.5* 8.7* 8.5*  HCT 33.8* 27.7* 27.1*  MCV 97.1 94.5 95.1  MCH 30.2 29.7 29.8  MCHC 31.1 31.4 31.4  RDW 17.0* 17.0* 17.1*  PLT 153 181 162   BNP Recent Labs  Lab 04/04/21 2055  BNP 731.8*    DDimer No results for input(s): DDIMER in the last 168 hours.   Radiology  CT CHEST WO CONTRAST  Result Date: 04/05/2021 CLINICAL DATA:  Pneumonia suspected on CT abdomen/pelvis EXAM: CT CHEST WITHOUT CONTRAST TECHNIQUE: Multidetector CT imaging of the chest was performed following the standard protocol without IV contrast. COMPARISON:  Partial comparison to CT abdomen/pelvis dated 04/05/2021 FINDINGS: Cardiovascular: Mild cardiomegaly.  No pericardial effusion. Atherosclerotic calcifications of the aortic arch. No evidence of thoracic aortic aneurysm. Three vessel coronary atherosclerosis. Postsurgical changes related to prior CABG. Left subclavian pacemaker. Mediastinum/Nodes: Small mediastinal lymph nodes, including a partially calcified/hyperdense 16 mm right azygoesophageal recess node (series 3/image 30), favored to be reactive. Visualized thyroid is unremarkable. Lungs/Pleura: Multifocal patchy opacities in the posterior right middle lobe and throughout the right lower lobe, reflecting pneumonia. Moderate centrilobular and paraseptal emphysematous changes, upper lung predominant. Two 3 mm nodules in the posterior left upper lobe (series 8/image 77), nonspecific. Small right pleural effusion, partially loculated. No pneumothorax. Upper Abdomen: Visualized upper abdomen is better evaluated on recent CT. Musculoskeletal: Degenerative  changes of the visualized thoracolumbar spine. Median sternotomy. Right anterolateral 3rd, 4th, and 7th rib fracture deformities, favored to be chronic. Right lateral 5th and 6th ribs are also mildly irregular. IMPRESSION: Multifocal pneumonia involving the right middle and lower lobes. Follow-up CT chest is suggested in 6-12 weeks to document clearance. Small right pleural effusion, partially loculated. Mild mediastinal lymphadenopathy, favored to be reactive. Two 3 mm nodules in the posterior left upper lobe, nonspecific. Attention on follow-up is suggested. Aortic Atherosclerosis (ICD10-I70.0) and Emphysema (ICD10-J43.9). Electronically Signed   By: Henderson Newcomer.D.  On: 04/05/2021 20:14    Cardiac Studies  TTE 03/26/2021  1. Left ventricular ejection fraction, by estimation, is 20%. The left  ventricle demonstrates global hypokinesis. The left ventricular internal  cavity size was mildly dilated. Left ventricular diastolic parameters are  indeterminate.   2. Right ventricular systolic function is moderately reduced. The right  ventricular size is mildly enlarged. Tricuspid regurgitation signal is  inadequate for assessing PA pressure.   3. Left atrial size was mildly dilated.   4. Doppler not assessed. No evidence of mitral stenosis. Moderate mitral  annular calcification.   5. Doppler not assessed. The aortic valve is tricuspid.   6. The inferior vena cava is normal in size with <50% respiratory  variability, suggesting right atrial pressure of 8 mmHg.   7. Limited echo. The patient was tachycardic in atrial fibrillation, rate  in 130s generally.   Patient Profile  [name] is a 65 y.o. male with CAD status post CABG, persistent atrial fibrillation, recent bradycardic arrest status post CRT-P, systolic heart failure (EF 20%, ICD referred), carotid artery disease status post left CEA, diabetes, developmental delay who was admitted on 04/05/2021 with nausea vomiting as well as  volume overload concerning for acute on chronic systolic heart failure.  Assessment & Plan   #Right lower lobe lung consolidation/pneumonia -Started on antibiotics per primary team  #Acute on chronic systolic heart failure, EF 20% -Not a candidate for aggressive cardiovascular care.  See discussion from last admission. -He is bedridden with multiple ulcers as well as right foot amputation. -Blood pressure has improved.  Sodium is improving.  Creatinine is improving.  We will continue diuresis.  Increase to 80 mg IV twice daily. -Continue metoprolol succinate 25 mg daily. -Not a candidate for ACE/ARB/Arni/MRA given significant CKD. -Start hydralazine 10 mg 3 times daily as well as Imdur 15 mg daily. -He has CRT-P.  Not a candidate for ICD based on discussion during last admission. -Not a candidate for invasive angiography -Resides in a SNF.  Palliative approach is likely the best option.  #Persistent atrial fibrillation -Continue metoprolol succinate. -Continue Eliquis  #Hyponatremia -Improving with diuresis.  For questions or updates, please contact [facility] Please consult [email or web address] for contact info under   Time Spent with Patient: I have spent a total of 35 minutes with patient reviewing hospital notes, telemetry, EKGs, labs and examining the patient as well as establishing an assessment and plan that was discussed with the patient.  > 50% of time was spent in direct patient care.    Signed, Addison Naegeli. Audie Box, MD, [place]  04/07/2021 9:55 AM

## 2021-04-07 NOTE — Plan of Care (Signed)
  Problem: Clinical Measurements: Goal: Will remain free from infection Outcome: Progressing   Problem: Coping: Goal: Level of anxiety will decrease Outcome: Progressing   Problem: Safety: Goal: Ability to remain free from injury will improve Outcome: Progressing   

## 2021-04-07 NOTE — Progress Notes (Addendum)
Subjective: I seen and evaluated [name] at bedside.  He appears well.  He was propped up in bed eating breakfast.  He states that he has some discomfort in his left upper chest.  He states Tylenol does not relieve the discomfort.  He denies shortness of breath, N/V, abdominal pain, or lower extremity discomfort.  Objective:  Vital signs in last 24 hours: Vitals:   04/06/21 1138 04/06/21 2038 04/07/21 0030 04/07/21 0343  BP: (!) 110/59 (!) 101/58 104/68 (!) 134/116  Pulse: 72 74 77 77  Resp: '19 19 19 18  '$ Temp: 98.8 F (37.1 C) 98.4 F (36.9 C) 98.3 F (36.8 C) (!) 97.4 F (36.3 C)  TempSrc: Oral Oral Oral Oral  SpO2: 97% 100% 100% 92%  Weight:   88.8 kg   Height:        Physical Exam Constitutional:      General: He is awake.     Interventions: Nasal cannula in place.  HENT:     Head: Normocephalic and atraumatic.  Cardiovascular:     Rate and Rhythm: Normal rate.     Heart sounds: Heart sounds are distant.  Pulmonary:     Effort: Pulmonary effort is normal.     Breath sounds: Rales present.  Abdominal:     General: There is distension.     Palpations: Abdomen is rigid.     Tenderness: There is no abdominal tenderness.     Comments: Fluid volume pulling in the abdominal region  Genitourinary:    Comments: Foley present Musculoskeletal:     Right lower leg: 1+ Edema present.     Left lower leg: 1+ Edema present.     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  Skin:    General: Skin is warm and dry.  Neurological:     General: No focal deficit present.     Mental Status: He is alert. Mental status is at baseline.  Psychiatric:        Mood and Affect: Mood normal.        Behavior: Behavior normal. Behavior is cooperative.      Assessment/Plan:  Active Problems:   Right lower lobe pneumonia   Acute exacerbation of CHF (congestive heart failure) ([place])    [name] is a 65 y.o. 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  Acute on chronic combined systolic and diastolic heart failure Volume status, electrolytes, and Cr continues to improve. --Lasix '80mg'$  BID --Metoprolol '25mg'$  daily --Hydralazine '10mg'$  TID --IMDUR '15mg'$  daily --Cardiology recommendation appreciated  Persistent Pneumonia Chest CT from 04/05/2021 significant for persistent RLL infiltrate that was initially noted on x-ray from prior admission.  Although there is not a prior CT for comparison, findings on CT in combination with chest x-ray raise suspicion for persistent pneumonia. MRSA PCR- positive. Suspicion for post obstruction causing persistent pneumonia. --Pulmonology consulted --Cefepime 2g BID --Vancomycin '1250mg'$  IV QOD --Flagyl '500mg'$  Q8H  AKI on CKD 3B #Hyponatremia Sodium level at 127 and Creatinine improved at 1.61. AKI and hyponatremia likely prerenal in the setting of heart failure exacerbation. --Repeat BMP in the AM --Strict I&Os, daily weights --Avoid nephrotoxic agents  CAD s/p CABG  2017 #Bilateral CAS s/p L carotic endarterectomy 2017 --PRN nitro  --ASA '81mg'$  daily --atorvastatin '80mg'$  daily   T2DM with peripheral neuropathy --SSI, Resistant scale --Resume Lantus when diet tolerated   Urinary retention #Hematuria Page received, bleeding steadily from Foley with large clots found in sheets. Patient is on Eliquis '5mg'$  BID. Continue to monitor for further bleeding. Flush foley if clot is obstructing flow. Consider Urology consult if hematuria persists. STAT CBC reveals Hgb of 8.4; STAT coags reveals PT/INR of 23.6/2.1 and aPTT of 51 --Hold second dose of Eliquis today in the  setting of penile meatus bleed; Resume regular scheduled doses tomorrow --Continue foley; day 5 of 7; per urology voiding trail at day 7 --Continue Flomax 0.4 mg daily    Prior to Admission Living Arrangement: Anticipated Discharge Location: Barriers to Discharge: Dispo: Anticipated discharge in approximately 1-2 day(s).   Timothy Lasso, MD 04/07/2021, 8:21 AM Pager: [phone] After 5pm on weekdays and 1pm on weekends: On Call pager [phone]

## 2021-04-07 NOTE — Consult Note (Addendum)
NAME:  [name], MRN:  [id number], DOB:  Jan 04, 1956, LOS: 2 ADMISSION DATE:  04/04/2021, CONSULTATION DATE:  8/31 REFERRING MD:  Dr. Claris Gower, CHIEF COMPLAINT:  Pneumonia   History of Present Illness:  65 year old male with complicated PMH as below, which is significant for developmental delay, large ventral hernia, CAD, COPD, Tachy/Brady syndrome, DM, PAF on DOAC, and recent admission 8/16 for cardiac arrest. Suffered multiple brief (10-20 seconds) arrests. Felt to be primarily arrhythmic in nature. Did not require intubation. He had permanent pacemaker placed 8/19 and was discharged to SNF on 8/26. He presented to [facility] ED from [facility] of 8/28 with complaints of nausea/vomiting, and SOB. He was admitted to the internal medicine teaching service for what was felt to be acute exacerbation of CHF. He was treated with diuresis and cardiology was consulted. CT of the abdomen was done as the result of ongoing abdominal pain (he notably has an extremely large ventral hernia with contains most of his large and small bowel). The CT demonstrated right sided pleural effusion and consolidation. Antibiotics were initiated and PCCM was consulted.   Pertinent  Medical History   has a past medical history of Abdominal hernia, Abscess, abdomen (12/31/2010), Anemia, Anxiety, AVM (arteriovenous malformation), Carotid artery disease ([place]), Chronic diastolic CHF (congestive heart failure) ([place]), Chronic low back pain, COPD (chronic obstructive pulmonary disease) ([place]), CVA (cerebral infarction) (09/2010), Diabetes mellitus, GERD (gastroesophageal reflux disease), History of nuclear stress test, echocardiogram, Hyperlipidemia, Hypertension, Intellectual disability, Itching, Lung nodule, Myocardial infarction ([place]) (2016 ?), Obesity, Oxygen dependent, PAF (paroxysmal atrial fibrillation) ([place]) (06/2009), Pneumonia, Stroke ([place]), Tobacco user, Tubular adenoma of colon, and Wears glasses.   Significant  Hospital Events: Including procedures, antibiotic start and stop dates in addition to other pertinent events   8/28 admit for ? CHF exacerbation 8/30 PCCM consulted for pneumonia  Interim History / Subjective:    Objective   Blood pressure 106/71, pulse 69, temperature 99.1 F (37.3 C), temperature source Oral, resp. rate 18, height 5' (1.524 m), weight 88.8 kg, SpO2 97 %.        Intake/Output Summary (Last 24 hours) at 04/07/2021 1436 Last data filed at 04/07/2021 1320 Gross per 24 hour  Intake 1463.27 ml  Output 2850 ml  Net -1386.73 ml   Filed Weights   04/06/21 0300 04/07/21 0030  Weight: 88.7 kg 88.8 kg    Examination: General: morbidly obese adult male in NAD HENT: [place]/AT, PERRL,  no JVD Lungs: Referred upper airway sounds.  Cardiovascular: RRR, no MRG Abdomen: Large right sided hernia.  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  Neuro: Alert, oriented, non-focal. Slow to respond at times.  GU: Foley  Resolved Hospital Problem list     Assessment & Plan:   Healthcare associate pneumonia: dense right lower lobe consolidation.  Patient is also a risk for aspiration based on SLP eval from 8/26 on his prior admission with recommendations for dysphagia 3 diet. MRSA PCR positive. Ipsilateral pleural effusion also present. Small by CT. Should it expand on future imaging, may need to be sampled.  - Agree with empiric antibiotics cefepime, vanco, flagyl - Sputum culture if able. Currently he is unable to clear secretions.  - Strep and Legionella urinary antigens pending.  - Exchange home inhalers for nebs. Duoneb, budesonide. - Chest PT would be ideal, may not tolerate well due to rib fracture - Incentive spirometry, flutter valve.  - SLP recommended dysphagia 3 diet on previous admission. Consider re-involving SLP,  as they mentioned barium study may be indicated.     Best Practice (right click and "Reselect all SmartList Selections" daily)   Diet/type:  dysphagia diet (see orders) DVT prophylaxis: DOAC GI prophylaxis: PPI Lines: N/A Foley:  Yes, and it is still needed Code Status:  full code Last date of multidisciplinary goals of care discussion '[ ]'$   Labs   CBC: Recent Labs  Lab 04/04/21 2055 04/05/21 0431 04/06/21 0013 04/07/21 0334 04/07/21 1238  WBC 18.3* 12.7* 12.6* 9.8 10.1  NEUTROABS 16.0*  --  10.8* 8.2*  --   HGB 9.2* 10.5* 8.7* 8.5* 8.4*  HCT 28.8* 33.8* 27.7* 27.1* 27.0*  MCV 96.0 97.1 94.5 95.1 95.7  PLT 195 153 181 162 [id number]    Basic Metabolic Panel: Recent Labs  Lab 04/02/21 0332 04/04/21 2055 04/05/21 0431 04/06/21 0013 04/07/21 0334  NA 130* 126* 124* 126* 127*  K 4.6 4.9 5.2* 4.6 4.2  CL 98 92* 92* 93* 93*  CO2 24 21* 19* 24 23  GLUCOSE 84 78 77 120* 147*  BUN 103* 109* 112* 108* 95*  CREATININE 1.80* 1.90* 1.88* 1.97* 1.61*  CALCIUM 8.1* 8.1* 7.9* 7.9* 7.9*  MG  --   --  2.1  --   --    GFR: Estimated Creatinine Clearance: 41.8 mL/min (A) (by C-G formula based on SCr of 1.61 mg/dL (H)). Recent Labs  Lab 04/04/21 2055 04/04/21 2233 04/05/21 0431 04/06/21 0013 04/07/21 0334 04/07/21 1238  PROCALCITON  --   --   --  0.69 0.44  --   WBC 18.3*  --  12.7* 12.6* 9.8 10.1  LATICACIDVEN 1.1 0.9  --  0.8  --   --     Liver Function Tests: Recent Labs  Lab 04/04/21 2055  AST 39  ALT 30  ALKPHOS 71  BILITOT 0.9  PROT 5.8*  ALBUMIN 1.9*   Recent Labs  Lab 04/04/21 2055  LIPASE 26   No results for input(s): AMMONIA in the last 168 hours.  ABG    Component Value Date/Time   PHART 7.405 09/25/2017 1633   PCO2ART 33.4 09/25/2017 1633   PO2ART 105.0 09/25/2017 1633   HCO3 17.9 (L) 03/23/2021 2235   TCO2 16 (L) 03/23/2021 1034   ACIDBASEDEF 8.2 (H) 03/23/2021 2235   O2SAT 45.3 03/23/2021 2235     Coagulation Profile: Recent Labs  Lab 04/04/21 2055 04/05/21 0431 04/07/21 1238  INR 2.2* 2.2* 2.1*    Cardiac Enzymes: No results for input(s): CKTOTAL, CKMB, CKMBINDEX,  TROPONINI in the last 168 hours.  HbA1C: Hemoglobin A1C  Date/Time Value Ref Range Status  08/11/2015 12:00 AM 5.6  Final   Hgb A1c MFr Bld  Date/Time Value Ref Range Status  03/23/2021 02:51 PM 7.0 (H) 4.8 - 5.6 % Final    Comment:    (NOTE) Pre diabetes:          5.7%-6.4%  Diabetes:              >6.4%  Glycemic control for   <7.0% adults with diabetes   09/28/2017 04:54 AM 7.9 (H) 4.8 - 5.6 % Final    Comment:    (NOTE) Pre diabetes:          5.7%-6.4% Diabetes:              >6.4% Glycemic control for   <7.0% adults with diabetes     CBG: Recent Labs  Lab 04/07/21 0024 04/07/21 0339 04/07/21 0649 04/07/21 1052 04/07/21 1212  GLUCAP 159* 152* 151* 136* 108*    Review of Systems:   Bolds are positive  Constitutional: weight loss, gain, night sweats, Fevers, chills, fatigue .  HEENT: headaches, Sore throat, sneezing, nasal congestion, post nasal drip, Difficulty swallowing, Tooth/dental problems, visual complaints visual changes, ear ache CV:  chest pain, radiates:,Orthopnea, PND, swelling in lower extremities, dizziness, palpitations, syncope.  GI  heartburn, indigestion, abdominal pain, nausea, vomiting, diarrhea, change in bowel habits, loss of appetite, bloody stools.  Resp: cough, productive: , hemoptysis, dyspnea, chest pain, pleuritic.  Skin: rash or itching or icterus GU: dysuria, change in color of urine, urgency or frequency. flank pain, hematuria  MS: joint pain or swelling. decreased range of motion  Psych: change in mood or affect. depression or anxiety.  Neuro: difficulty with speech, weakness, numbness, ataxia    Past Medical History:  He,  has a past medical history of Abdominal hernia, Abscess, abdomen (12/31/2010), Anemia, Anxiety, AVM (arteriovenous malformation), Carotid artery disease ([place]), Chronic diastolic CHF (congestive heart failure) ([place]), Chronic low back pain, COPD (chronic obstructive pulmonary disease) ([place]), CVA (cerebral  infarction) (09/2010), Diabetes mellitus, GERD (gastroesophageal reflux disease), History of nuclear stress test, echocardiogram, Hyperlipidemia, Hypertension, Intellectual disability, Itching, Lung nodule, Myocardial infarction ([place]) (2016 ?), Obesity, Oxygen dependent, PAF (paroxysmal atrial fibrillation) ([place]) (06/2009), Pneumonia, Stroke ([place]), Tobacco user, Tubular adenoma of colon, and Wears glasses.   Surgical History:   Past Surgical History:  Procedure Laterality Date   AMPUTATION Right 03/29/2013   Procedure: AMPUTATION RAY;  Surgeon: Newt Minion, MD;  Location: [place];  Service: Orthopedics;  Laterality: Right;  Right Foot 3rd and Possible 4th Ray Amputation   AMPUTATION Right 04/23/2013   Procedure: AMPUTATION RIGHT MID-FOOT;  Surgeon: Newt Minion, MD;  Location: [place];  Service: Orthopedics;  Laterality: Right;   arm surgery Left    as a child   CARDIAC CATHETERIZATION N/A 04/30/2015   Procedure: Left Heart Cath and Coronary Angiography;  Surgeon: Belva Crome, MD;  Location: [facility];  Service: Cardiovascular;  Laterality: N/A;   Carotid arteriogram  10/2010   30% right ICA stenosis, 40% left ICA stenosis    CAROTID ENDARTERECTOMY Left 08-13-15   CEA   CATARACT EXTRACTION, BILATERAL     COLONOSCOPY WITH PROPOFOL N/A 06/10/2014   Procedure: COLONOSCOPY WITH PROPOFOL;  Surgeon: Jerene Bears, MD;  Location: [facility];  Service: Gastroenterology;  Laterality: N/A;   CORONARY ARTERY BYPASS GRAFT N/A 08/13/2015   Procedure: CORONARY ARTERY BYPASS GRAFTING (CABG), ON PUMP, TIMES THREE, USING LEFT INTERNAL MAMMARY ARTERY, RIGHT GREATER SAPHENOUS VEIN HARVESTED ENDOSCOPICALLY;  Surgeon: [name], MD;  Location: [place];  Service: Open Heart Surgery;  Laterality: N/A;   DEBRIDMENT OF DECUBITUS ULCER Right 02/13/2013   ENDARTERECTOMY Left 08/13/2015   Procedure: ENDARTERECTOMY CAROTID;  Surgeon: Angelia Mould, MD;  Location: [place];  Service: Vascular;  Laterality: Left;    ESOPHAGOGASTRODUODENOSCOPY (EGD) WITH PROPOFOL N/A 06/10/2014   Procedure: ESOPHAGOGASTRODUODENOSCOPY (EGD) WITH PROPOFOL;  Surgeon: Jerene Bears, MD;  [facility];  Service: Gastroenterology;  Laterality: N/A;   GIVENS CAPSULE STUDY N/A 07/09/2014   Procedure: GIVENS CAPSULE STUDY;  Surgeon: Jerene Bears, MD;  [facility];  Service: Gastroenterology;  Laterality: N/A;   I & D EXTREMITY Right 02/13/2013   Procedure: IRRIGATION AND DEBRIDEMENT FOOT ULCER;  Surgeon: Johnny Bridge, MD;  Location: [place];  Service: Orthopedics;  Laterality: Right;  PULSE LAVAGE   INSERTION OF DIALYSIS  CATHETER N/A 10/02/2017   Procedure: INSERTION OF TUNNELED DIALYSIS CATHETER;  Surgeon: Elam Dutch, MD;  [facility];  Service: Vascular;  Laterality: N/A;   IR REMOVAL TUN CV CATH W/O FL  10/25/2017   MULTIPLE TOOTH EXTRACTIONS     PACEMAKER IMPLANT N/A 03/26/2021   Procedure: PACEMAKER IMPLANT;  Surgeon: Constance Haw, MD;  Location: [place] [facility];  Service: Cardiovascular;  Laterality: N/A;   TEE WITHOUT CARDIOVERSION N/A 08/13/2015   Procedure: TRANSESOPHAGEAL ECHOCARDIOGRAM (TEE);  Surgeon: [name], MD;  Location: [place];  Service: Open Heart Surgery;  Laterality: N/A;   TEMPORARY PACEMAKER N/A 09/15/2017   Procedure: TEMPORARY PACEMAKER;  Surgeon: Jettie Booze, MD;  Location: [place] [facility];  Service: Cardiovascular;  Laterality: N/A;   TEMPORARY PACEMAKER N/A 03/23/2021   Procedure: TEMPORARY PACEMAKER;  Surgeon: Martinique, Peter M, MD;  Location: [place] [facility];  Service: Cardiovascular;  Laterality: N/A;   TRANSESOPHAGEAL ECHOCARDIOGRAM  09/2010   No ASD or PFO. EF 60-65%.  Normal systolic function. No evidence of thrombus.    TRANSTHORACIC ECHOCARDIOGRAM  09/2010    The cavity size was normal. Systolic function was vigorous.  EF 65-70%.  Normal wall funciton.    ULTRASOUND GUIDANCE FOR VASCULAR ACCESS  09/15/2017   Procedure: Ultrasound Guidance For  Vascular Access;  Surgeon: Jettie Booze, MD;  Location: [place] [facility];  Service: Cardiovascular;;     Social History:   reports that he quit smoking about 10 years ago. His smoking use included cigarettes and pipe. He has a 42.00 pack-year smoking history. He quit smokeless tobacco use about 10 years ago. He reports that he does not drink alcohol and does not use drugs.   Family History:  His family history includes Breast cancer in his maternal grandmother; Heart attack in his maternal grandmother; Heart disease in his brother, father, maternal grandmother, and mother; Hypertension in his sister; Peripheral vascular disease in his father; Stomach cancer in his maternal uncle. There is no history of Colon cancer or Stroke.   Allergies Allergies  Allergen Reactions   Penicillins Hives, Nausea And Vomiting, Swelling and Other (See Comments)    Tolerated Cefepime Has patient had a PCN reaction causing immediate rash, facial/tongue/throat swelling, SOB or lightheadedness with hypotension: YES Has patient had a PCN reaction causing severe rash involving mucus membranes or skin necrosis: No Has patient had a PCN reaction that required hospitalization No Has patient had a PCN reaction occurring within the last 10 years: No If all of the above answers are "NO", then may proceed with Cephalosporin use.      Home Medications  Prior to Admission medications   Medication Sig Start Date End Date Taking? Authorizing Provider  acetaminophen (TYLENOL) 325 MG tablet Take 650 mg by mouth 3 (three) times daily as needed for mild pain.   Yes [provider]  apixaban (ELIQUIS) 5 MG TABS tablet Take 1 tablet (5 mg total) by mouth 2 (two) times daily. 04/02/21 07/01/21 Yes Timothy Lasso, MD  aspirin 81 MG EC tablet Take 81 mg by mouth daily.   Yes [provider]  atorvastatin (LIPITOR) 80 MG tablet Take 1 tablet (80 mg total) by mouth daily. 10/26/17  Yes Colbert Ewing, MD   carvedilol (COREG) 12.5 MG tablet Take 1 tablet (12.5 mg total) by mouth 2 (two) times daily with a meal. 04/02/21 07/01/21 Yes Timothy Lasso, MD  Cholecalciferol (VITAMIN D3) 50 MCG (2000 UT) TABS Take 50  mcg by mouth daily.   Yes [provider]  guaiFENesin (ROBITUSSIN) 100 MG/5ML SOLN Take 5 mLs by mouth every 4 (four) hours as needed for cough or to loosen phlegm.   Yes [provider]  insulin glargine (LANTUS) 100 UNIT/ML injection Inject 0.14 mLs (14 Units total) into the skin at bedtime. 10/26/17  Yes Colbert Ewing, MD  losartan (COZAAR) 25 MG tablet Take 0.5 tablets (12.5 mg total) by mouth daily. 04/03/21 07/02/21 Yes Timothy Lasso, MD  metFORMIN (GLUCOPHAGE) 500 MG tablet Take 1 tablet (500 mg total) by mouth daily with breakfast. 04/03/21 07/02/21 Yes Timothy Lasso, MD  Multiple Vitamin (MULTIVITAMIN) capsule Take 1 capsule by mouth daily.   Yes [provider]  multivitamin (RENA-VIT) TABS tablet Take 1 tablet by mouth at bedtime. 10/26/17  Yes Colbert Ewing, MD  nitroGLYCERIN (NITROSTAT) 0.4 MG SL tablet Place 0.4 mg under the tongue every 5 (five) minutes as needed for chest pain.   Yes [provider]  nystatin (MYCOSTATIN/NYSTOP) powder Apply topically 4 (four) times daily. Apply to inguinal area/scrotum and under pannus Patient taking differently: Apply 1 application topically in the morning, at noon, and at bedtime. To skin folds to treat intertrigo 03/02/18  Yes Ardis Hughs, MD  pantoprazole (PROTONIX) 40 MG tablet Take 40 mg by mouth daily.   Yes [provider]  PARoxetine (PAXIL) 40 MG tablet Take 40 mg by mouth daily.    Yes [provider]  polyvinyl alcohol (LIQUIFILM TEARS) 1.4 % ophthalmic solution Place 1 drop into both eyes 2 (two) times daily as needed for dry eyes. Natural Tears   Yes [provider]  senna-docusate (SENOKOT-S) 8.6-50 MG tablet Take 1 tablet by mouth at bedtime as needed for  mild constipation. 10/26/17  Yes Colbert Ewing, MD  tamsulosin (FLOMAX) 0.4 MG CAPS capsule Take 1 capsule (0.4 mg total) by mouth daily. 04/03/21 05/03/21 Yes Timothy Lasso, MD  tiotropium (SPIRIVA) 18 MCG inhalation capsule Place 18 mcg into inhaler and inhale daily.   Yes [provider]  Darbepoetin Alfa (ARANESP) 150 MCG/0.3ML SOSY injection Inject 0.3 mLs (150 mcg total) into the skin every Wednesday at 6 PM. Patient not taking: Reported on 04/05/2021 11/01/17   Colbert Ewing, MD  furosemide (LASIX) 80 MG tablet Take 1 tablet (80 mg total) by mouth 2 (two) times daily. 04/02/21 07/01/21  Lucious Groves, DO  OXYGEN Inhale 2.5 L into the lungs See admin instructions. As needed and at bedtime for shortness of breath    [provider]      Georgann Housekeeper, AGACNP-BC [place] for personal pager PCCM on call pager [phone] until 7pm. Please call Elink 7p-7a. [phone]  04/07/2021 3:43 PM

## 2021-04-07 NOTE — Progress Notes (Signed)
Heart Failure Navigator Progress Note  Assessed for Heart & Vascular TOC clinic readiness.  Patient does not meet criteria due to bed bound and not candidate for aggressive cardiovascular care per cardiology note. Pt resides at [place], plan to return at DC.    Navigator available for reassessment of patient.   Pricilla Holm, MSN, RN Heart Failure Nurse Navigator [phone]

## 2021-04-07 NOTE — Progress Notes (Signed)
On assessment, patient was seen with large blood clot on bed pad. Blood seen coming from foley catheter site. Patient also bleeding from skin tears that he was admitted with. Foam dressings changed. MD has been paged via [place].

## 2021-04-07 NOTE — Hospital Course (Addendum)
Prior admission 03/23/21-04/02/21- syncope and cardiac arrest. Patient had pacemaker placed 03/26/21. Tested positive for COVID treated w/ dexamethasone & remdesivir. Also treated for CAP with Rocephin and doxycycline. Cardiology was following closely (review cardiac meds)  [facility] started on Metformin at time of DC. Patient has Severe HFrEF (20-25% EF) on lasix '40mg'$  BID PRN for edema. [facility] did not give patient Lasix.  Patient was then readmitted on 04/04/21 for acute HF exacerbation. He also had severe nausea/vomiting 2/2 metformin adverse affect with has since been discontinued. Patient also presented with hypotension and left shift leukocytosis; initial concern for sepsis (ruled out; see previous note) Patient also had concerning findings of pneumonia on imaging.   Appears to be persistent pneumonia; started on vancomycin (MRSA positive); cefepime and flagyl.  Currently, cardiology is following and have him on '80mg'$  lasix BID for volume overload. Patient tends to have soft pressures; watch for hypotension. Patient also has a pacemaker (placed 03/26/21, prior admission).   Otherwise, stable  CT abdomen pelvis w con (9/1) Persistent right lower lobe infection/inflammation. Followup PA and lateral chest X-ray is recommended in 3-4 weeks following therapy to ensure resolution and exclude underlying malignancy. Recommend MRI renal protocol for further evaluation. When the patient is clinically stable and able to follow directions and hold their breath (preferably as an outpatient) further evaluation with dedicated abdominal MRI should be considered.

## 2021-04-08 ENCOUNTER — Ambulatory Visit: Payer: Medicare (Managed Care)

## 2021-04-08 ENCOUNTER — Inpatient Hospital Stay (HOSPITAL_COMMUNITY): Payer: Medicare (Managed Care)

## 2021-04-08 DIAGNOSIS — J189 Pneumonia, unspecified organism: Secondary | ICD-10-CM | POA: Diagnosis not present

## 2021-04-08 DIAGNOSIS — I5023 Acute on chronic systolic (congestive) heart failure: Secondary | ICD-10-CM | POA: Diagnosis not present

## 2021-04-08 DIAGNOSIS — I4819 Other persistent atrial fibrillation: Secondary | ICD-10-CM | POA: Diagnosis not present

## 2021-04-08 DIAGNOSIS — Z515 Encounter for palliative care: Secondary | ICD-10-CM

## 2021-04-08 LAB — BASIC METABOLIC PANEL
Anion gap: 9 (ref 5–15)
Anion gap: 9 (ref 5–15)
BUN: 93 mg/dL — ABNORMAL HIGH (ref 8–23)
BUN: 96 mg/dL — ABNORMAL HIGH (ref 8–23)
CO2: 23 mmol/L (ref 22–32)
CO2: 24 mmol/L (ref 22–32)
Calcium: 8 mg/dL — ABNORMAL LOW (ref 8.9–10.3)
Calcium: 8 mg/dL — ABNORMAL LOW (ref 8.9–10.3)
Chloride: 96 mmol/L — ABNORMAL LOW (ref 98–111)
Chloride: 98 mmol/L (ref 98–111)
Creatinine, Ser: 1.87 mg/dL — ABNORMAL HIGH (ref 0.61–1.24)
Creatinine, Ser: 1.94 mg/dL — ABNORMAL HIGH (ref 0.61–1.24)
GFR, Estimated: 39 mL/min — ABNORMAL LOW (ref >60)
GFR, Estimated: 37 mL/min — ABNORMAL LOW (ref >60)
Glucose, Bld: 126 mg/dL — ABNORMAL HIGH (ref 70–99)
Glucose, Bld: 101 mg/dL — ABNORMAL HIGH (ref 70–99)
Potassium: 4.2 mmol/L (ref 3.5–5.1)
Potassium: 4.3 mmol/L (ref 3.5–5.1)
Sodium: 128 mmol/L — ABNORMAL LOW (ref 135–145)
Sodium: 131 mmol/L — ABNORMAL LOW (ref 135–145)

## 2021-04-08 LAB — CBC
HCT: 24.1 % — ABNORMAL LOW (ref 39.0–52.0)
HCT: 22.1 % — ABNORMAL LOW (ref 39.0–52.0)
Hemoglobin: 7.6 g/dL — ABNORMAL LOW (ref 13.0–17.0)
Hemoglobin: 7 g/dL — ABNORMAL LOW (ref 13.0–17.0)
MCH: 30 pg (ref 26.0–34.0)
MCH: 30.2 pg (ref 26.0–34.0)
MCHC: 31.5 g/dL (ref 30.0–36.0)
MCHC: 31.7 g/dL (ref 30.0–36.0)
MCV: 95.3 fL (ref 80.0–100.0)
MCV: 95.3 fL (ref 80.0–100.0)
Platelets: 164 10*3/uL (ref 150–400)
Platelets: 168 10*3/uL (ref 150–400)
RBC: 2.53 MIL/uL — ABNORMAL LOW (ref 4.22–5.81)
RBC: 2.32 MIL/uL — ABNORMAL LOW (ref 4.22–5.81)
RDW: 16.9 % — ABNORMAL HIGH (ref 11.5–15.5)
RDW: 17 % — ABNORMAL HIGH (ref 11.5–15.5)
WBC: 8.5 10*3/uL (ref 4.0–10.5)
WBC: 6.9 10*3/uL (ref 4.0–10.5)
nRBC: 0 % (ref 0.0–0.2)
nRBC: 0 % (ref 0.0–0.2)

## 2021-04-08 LAB — POCT I-STAT 7, (LYTES, BLD GAS, ICA,H+H)
Acid-Base Excess: 4 mmol/L — ABNORMAL HIGH (ref 0.0–2.0)
Bicarbonate: 25.9 mmol/L (ref 20.0–28.0)
Calcium, Ion: 0.85 mmol/L — CL (ref 1.15–1.40)
HCT: 18 % — ABNORMAL LOW (ref 39.0–52.0)
Hemoglobin: 6.1 g/dL — CL (ref 13.0–17.0)
O2 Saturation: 100 %
Patient temperature: 98.1
Potassium: 3.8 mmol/L (ref 3.5–5.1)
Sodium: 130 mmol/L — ABNORMAL LOW (ref 135–145)
TCO2: 27 mmol/L (ref 22–32)
pCO2 arterial: 26.8 mmHg — ABNORMAL LOW (ref 32.0–48.0)
pH, Arterial: 7.592 — ABNORMAL HIGH (ref 7.350–7.450)
pO2, Arterial: 183 mmHg — ABNORMAL HIGH (ref 83.0–108.0)

## 2021-04-08 LAB — AMMONIA: Ammonia: 17 umol/L (ref 9–35)

## 2021-04-08 LAB — GLUCOSE, CAPILLARY
Glucose-Capillary: 106 mg/dL — ABNORMAL HIGH (ref 70–99)
Glucose-Capillary: 118 mg/dL — ABNORMAL HIGH (ref 70–99)
Glucose-Capillary: 119 mg/dL — ABNORMAL HIGH (ref 70–99)
Glucose-Capillary: 122 mg/dL — ABNORMAL HIGH (ref 70–99)
Glucose-Capillary: 127 mg/dL — ABNORMAL HIGH (ref 70–99)
Glucose-Capillary: 128 mg/dL — ABNORMAL HIGH (ref 70–99)

## 2021-04-08 LAB — CORTISOL: Cortisol, Plasma: 13.7 ug/dL

## 2021-04-08 LAB — PHOSPHORUS: Phosphorus: 4.4 mg/dL (ref 2.5–4.6)

## 2021-04-08 LAB — PROTIME-INR
INR: 2.3 — ABNORMAL HIGH (ref 0.8–1.2)
Prothrombin Time: 25 seconds — ABNORMAL HIGH (ref 11.4–15.2)

## 2021-04-08 LAB — LACTIC ACID, PLASMA
Lactic Acid, Venous: 0.8 mmol/L (ref 0.5–1.9)
Lactic Acid, Venous: 0.7 mmol/L (ref 0.5–1.9)

## 2021-04-08 LAB — PROCALCITONIN: Procalcitonin: 0.33 ng/mL

## 2021-04-08 LAB — HEMOGLOBIN AND HEMATOCRIT, BLOOD
HCT: 25.7 % — ABNORMAL LOW (ref 39.0–52.0)
Hemoglobin: 8.6 g/dL — ABNORMAL LOW (ref 13.0–17.0)

## 2021-04-08 LAB — MAGNESIUM: Magnesium: 2.1 mg/dL (ref 1.7–2.4)

## 2021-04-08 LAB — FIBRINOGEN: Fibrinogen: 655 mg/dL — ABNORMAL HIGH (ref 210–475)

## 2021-04-08 LAB — APTT: aPTT: 51 seconds — ABNORMAL HIGH (ref 24–36)

## 2021-04-08 LAB — STREP PNEUMONIAE URINARY ANTIGEN: Strep Pneumo Urinary Antigen: NEGATIVE

## 2021-04-08 LAB — PREPARE RBC (CROSSMATCH)

## 2021-04-08 MED ORDER — SODIUM CHLORIDE 0.9% IV SOLUTION: Freq: Once | INTRAVENOUS | Status: DC

## 2021-04-08 MED ORDER — CALCIUM GLUCONATE-NACL 1-0.675 GM/50ML-% IV SOLN
1.0000 g | Freq: Once | INTRAVENOUS | Status: AC
  Administered 2021-04-08: 1000 mg via INTRAVENOUS
  Filled 2021-04-08: qty 50

## 2021-04-08 MED ORDER — SODIUM CHLORIDE 0.9 % IV SOLN
500.0000 mg | Freq: Once | INTRAVENOUS | Status: AC
  Administered 2021-04-08: 500 mg via INTRAVENOUS
  Filled 2021-04-08: qty 500

## 2021-04-08 MED ORDER — MAGNESIUM SULFATE 2 GM/50ML IV SOLN
2.0000 g | Freq: Once | INTRAVENOUS | Status: AC
  Administered 2021-04-08: 2 g via INTRAVENOUS
  Filled 2021-04-08: qty 50

## 2021-04-08 MED ORDER — VANCOMYCIN HCL IN DEXTROSE 1-5 GM/200ML-% IV SOLN
1000.0000 mg | INTRAVENOUS | Status: DC
  Administered 2021-04-08: 1000 mg via INTRAVENOUS
  Filled 2021-04-08 (×2): qty 200

## 2021-04-08 MED ORDER — IOHEXOL 9 MG/ML PO SOLN
ORAL | Status: AC
  Administered 2021-04-08: 500 mL
  Filled 2021-04-08: qty 1000

## 2021-04-08 MED ORDER — IOHEXOL 350 MG/ML SOLN
75.0000 mL | Freq: Once | INTRAVENOUS | Status: AC | PRN
  Administered 2021-04-08: 75 mL via INTRAVENOUS

## 2021-04-08 NOTE — Progress Notes (Signed)
HD#3 Subjective:  Overnight Events: Unremarkable  I have seen and evaluated [name] with attending Dr. Evette Doffing at bedside. Patient was somnolent, and did not answer this author's questions.  Objective:  Vital signs in last 24 hours: Vitals:   04/08/21 1340 04/08/21 1351 04/08/21 1643 04/08/21 1700  BP: (!) 96/59  (!) 91/47 (!) 128/104  Pulse: 63  82 68  Resp: 18   18  Temp:   98.3 F (36.8 C) 97.7 F (36.5 C)  TempSrc:   Axillary Axillary  SpO2:  100% 100% 100%  Weight:      Height:       Supplemental O2: Nasal Cannula SpO2: 100 % O2 Flow Rate (L/min): 3 L/min   Physical Exam:  Physical Exam Vitals and nursing note reviewed.  Constitutional:      Appearance: He is ill-appearing.  HENT:     Head: Normocephalic.  Cardiovascular:     Rate and Rhythm: Normal rate.  Pulmonary:     Effort: No respiratory distress.     Breath sounds: No wheezing.  Musculoskeletal:     Right lower leg: Edema present.     Left lower leg: Edema present.  Skin:    Coloration: Skin is pale.     Findings: Bruising present.    Filed Weights   04/07/21 0030 04/08/21 0050 04/08/21 0500  Weight: 88.8 kg 86.4 kg 86.4 kg     Intake/Output Summary (Last 24 hours) at 04/08/2021 1848 Last data filed at 04/08/2021 1500 Gross per 24 hour  Intake 480 ml  Output 750 ml  Net -270 ml   Net IO Since Admission: -2,796.73 mL [04/08/21 1848]  Pertinent Labs: CBC Latest Ref Rng & Units 04/08/2021 04/08/2021 04/08/2021  WBC 4.0 - 10.5 K/uL 6.9 - 8.5  Hemoglobin 13.0 - 17.0 g/dL 7.0(L) 6.1(LL) 7.6(L)  Hematocrit 39.0 - 52.0 % 22.1(L) 18.0(L) 24.1(L)  Platelets 150 - 400 K/uL 168 - 164    CMP Latest Ref Rng & Units 04/08/2021 04/08/2021 04/08/2021  Glucose 70 - 99 mg/dL 101(H) - 126(H)  BUN 8 - 23 mg/dL 96(H) - 93(H)  Creatinine 0.61 - 1.24 mg/dL 1.94(H) - 1.87(H)  Sodium 135 - 145 mmol/L 131(L) 130(L) 128(L)  Potassium 3.5 - 5.1 mmol/L 4.3 3.8 4.2  Chloride 98 - 111 mmol/L 98 - 96(L)  CO2 22 - 32  mmol/L 24 - 23  Calcium 8.9 - 10.3 mg/dL 8.0(L) - 8.0(L)  Total Protein 6.5 - 8.1 g/dL - - -  Total Bilirubin 0.3 - 1.2 mg/dL - - -  Alkaline Phos 38 - 126 U/L - - -  AST 15 - 41 U/L - - -  ALT 0 - 44 U/L - - -    Imaging: No results found.  Assessment/Plan:   Active Problems:   Right lower lobe pneumonia   Acute exacerbation of CHF (congestive heart failure) ([place])   Patient Summary: [name] is a 65 y.o. 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  Acute on chronic combined systolic and diastolic heart failure #Persisitent hypotension - hypotensive throughout the day, holding BP meds --HOLD Lasix '80mg'$  BID due to hypotension per cardiology's recommendations --HOLD Metoprolol '25mg'$   daily due to hypotension per cardiology's recommendations --HOLD Hydralazine '10mg'$  TID due to hypotension per cardiology's recommendations --HOLD IMDUR '15mg'$  daily due to hypotension per cardiology's recommendations --Cardiology recommendation appreciated  Acute on Chronic Anemia #Anemia - Hb 7.0 down trending, concerned for internal bleed #Acute AMS --Ordered CT Abdomen Pelvis with contrast --Continue CBC daily --Typed and screened: B pos --Transfused 1unit RBCs --Check ABG and ammonia  Persistent Pneumonia Chest CT from 04/05/2021 significant for persistent RLL infiltrate that was initially noted on x-ray from prior admission.  Although there is not a prior CT for comparison, findings on CT in combination with chest x-ray raise suspicion for persistent pneumonia. MRSA PCR- positive. Suspicion for post obstruction causing  persistent pneumonia. --Pulmonology consulted --Cefepime 2gm IV q12h --Change Vancomycin '1250mg'$  IV QOD to '1000mg'$  IVq24h --Flagyl '500mg'$  Q8H --Lactic acid - Active   AKI on CKD 3B #Hyponatremia Sodium level improved at 128 and Creatinine worsening at 1.87. AKI and hyponatremia likely prerenal in the setting of heart failure exacerbation. --Continue BMP daily --Continue Strict I&Os, daily weights --Avoid nephrotoxic agents   CAD s/p CABG 2017 #Bilateral CAS s/p L carotic endarterectomy 2017 --PRN nitro  --ASA '81mg'$  daily --Atorvastatin '80mg'$  daily   T2DM with peripheral neuropathy --SSI, Resistant scale --Resume Lantus when diet tolerated   Urinary retention #Hematuria-resolved Patient is on Eliquis '5mg'$  BID. Continue to monitor for further bleeding. Flush foley if clot is obstructing flow. Consider Urology consult if hematuria persists. STAT CBC reveals Hgb of 8.4; STAT coags reveals PT/INR of 23.6/2.1 and aPTT of 51 --HOLD Eliquis in the setting of lethargy and hypotension --Continue foley; day 5 of 7; per urology voiding trail at day 7 --Discontinue Flomax 0.4 mg daily - until foley removed  Diet: Carb/Renal IVF: NS, continuos at 5m/hr ; Cefepime 2g at 2018mhr; vancomycin '1000mg'$ /200100mer hr VTE: None Code: Full PT/OT recs: SNF for Subacute PT, none.   Dispo: Anticipated discharge to Rehab (HeHelene KelpNguMerrily BrittleO 04/08/2021, 6:48 PM Pager: [phone] contact the on call pager after 5 pm and on weekends at [phone]

## 2021-04-08 NOTE — Progress Notes (Addendum)
UPDATE:  [name] was evaluated this morning on rounds, found to be lethargic but arousable. Throughout the day, he has been hypotensive and has had non-sustained ventricular tachycardia. I discussed these updates with both [name] sister, [name], and niece, [name]. I explained possible etiologies for this, including end stage heart failure vs abdominal bleed vs infection. I further explained that his overall clinical picture is severe and has worsened over the last 24 hours. I also told them that his primary team, cardiology, and pulmonology are very concerned that he could acutely deteriorate overnight.   I introduced code status to both family members. I mentioned that currently he is FULL CODE, meaning he would undergo chest compressions, shock, and medications in the event of an arrest. I informed her that our recommendation for [name] would be DO NOT RESUSCITATE given his current state of health. I explained that likelihood of a meaningful recovery after a cardiac arrest would be very minimal given his advanced heart failure and other co-morbidities. I also explained that changing code status would not change our current therapies and we would continue to treat [name] for his conditions. [name], [name]'s power of attorney, explained that she would like to discuss this with her other siblings.  After 45 minutes, I called [name] and [name] back to further discuss. [name] states that she would like for [name] to continue to be FULL CODE for now. She explained that she and her siblings will continue to discuss as his hospital course continues. Given his acute worsening, she is requesting for family members to visit tonight. I discussed this with the charge RN on 3E, who agrees for family to visit [name] to visit tonight.  We will continue to monitor [name] and discuss with family tomorrow for continued goals of care  discussion.  -Continue FULL CODE  Sanjuan Dame, MD Internal Medicine PGY-2 [phone]

## 2021-04-08 NOTE — Progress Notes (Signed)
ABG results PH 7.58 PCO2 27.2 HCO3 25.9 PO2 185  Results called to Dr Erin Fulling

## 2021-04-08 NOTE — Progress Notes (Signed)
Patient is due for his PPM wound check visit Abbott CRT-P implanted 03/26/21 is due for his wound check visit Reviewed chart Patient acknowledges me, but not overly interactive (seems unchanged from notes this AM) Device was checked wby industry He is AF/VS Battery and lead measurements are all good including LV lead VP 13% Mode switch base rate changed to the same as his baseline 60bpm Reached out to attending cardiologist about possibly programming LV lead on for CRT pacing, though not felt to change his clinical picture or course and not to turn LV lead on.  He is in AFib rates 80's and CRT pacing into this would be difficult as well  Steri strips are removed without difficulty. Mild ecchymosis no bleeding, no hematoma Skin edges are approximated, wound is healed No heat, no signs of infection.  Joseph Standard, PA-C

## 2021-04-08 NOTE — Progress Notes (Signed)
Cardiology Progress Note  Patient ID: [name] MRN: [id number] DOB: [birth year] Date of Encounter: 04/08/2021  Primary Cardiologist: Mertie Moores, MD  Subjective   Chief Complaint: None.  HPI: Low blood pressure this morning.  Quite drowsy.  Not as interactive.  ROS:  All other ROS reviewed and negative. Pertinent positives noted in the HPI.     Inpatient Medications  Scheduled Meds:  apixaban  5 mg Oral BID   aspirin EC  81 mg Oral Daily   atorvastatin  80 mg Oral Daily   budesonide (PULMICORT) nebulizer solution  0.5 mg Nebulization BID   Chlorhexidine Gluconate Cloth  6 each Topical Daily   insulin aspart  0-20 Units Subcutaneous Q4H   ipratropium-albuterol  3 mL Nebulization Q6H   metroNIDAZOLE  500 mg Oral Q8H   mupirocin ointment  1 application Nasal BID   nystatin  1 application Topical TID   pantoprazole  40 mg Oral Daily   PARoxetine  40 mg Oral Daily   senna  1 tablet Oral BID   tamsulosin  0.4 mg Oral Daily   Continuous Infusions:  ceFEPime (MAXIPIME) IV 2 g (04/08/21 0930)   vancomycin     PRN Meds: acetaminophen **OR** acetaminophen, nitroGLYCERIN, ondansetron **OR** ondansetron (ZOFRAN) IV, polyethylene glycol   Vital Signs   Vitals:   04/08/21 0419 04/08/21 0726 04/08/21 0729 04/08/21 0935  BP: (!) 93/47   (!) 92/48  Pulse: 63   94  Resp: 18   20  Temp: 97.6 F (36.4 C)   97.7 F (36.5 C)  TempSrc: Oral   Oral  SpO2: 100% 91% 96% 100%  Weight:      Height:        Intake/Output Summary (Last 24 hours) at 04/08/2021 1045 Last data filed at 04/08/2021 0902 Gross per 24 hour  Intake 777 ml  Output 1100 ml  Net -323 ml   Last 3 Weights 04/08/2021 04/07/2021 04/06/2021  Weight (lbs) 190 lb 7.6 oz 195 lb 12.3 oz 195 lb 8.8 oz  Weight (kg) 86.4 kg 88.8 kg 88.7 kg      Telemetry  Overnight telemetry shows atrial fibrillation heart rate in the 90s, 12-second run of nonsustained ventricular tachycardia, which I personally reviewed.   Physical  Exam   Vitals:   04/08/21 0419 04/08/21 0726 04/08/21 0729 04/08/21 0935  BP: (!) 93/47   (!) 92/48  Pulse: 63   94  Resp: 18   20  Temp: 97.6 F (36.4 C)   97.7 F (36.5 C)  TempSrc: Oral   Oral  SpO2: 100% 91% 96% 100%  Weight:      Height:        Intake/Output Summary (Last 24 hours) at 04/08/2021 1045 Last data filed at 04/08/2021 0902 Gross per 24 hour  Intake 777 ml  Output 1100 ml  Net -323 ml    Last 3 Weights 04/08/2021 04/07/2021 04/06/2021  Weight (lbs) 190 lb 7.6 oz 195 lb 12.3 oz 195 lb 8.8 oz  Weight (kg) 86.4 kg 88.8 kg 88.7 kg    Body mass index is 37.2 kg/m.   General: Sleepy, drowsy this morning Head: Atraumatic, normal size  Eyes: PEERLA, EOMI  Neck: Supple, JVD 8 to 10 cm of water Endocrine: No thryomegaly Cardiac: Normal S1, S2; irregular rhythm Lungs: Reduced breath sounds at the right lung base Abd: Soft, nontender, no hepatomegaly  Ext: Trace edema Musculoskeletal: No deformities, BUE and BLE strength normal and equal Skin: Warm and dry, no  rashes   Neuro: Drowsy, will awaken and follow commands  Labs  High Sensitivity Troponin:   Recent Labs  Lab 03/23/21 1451 04/04/21 2055 04/04/21 2233 04/05/21 0431 04/05/21 0727  TROPONINIHS 701* 200* 223* 243* 245*     Cardiac EnzymesNo results for input(s): TROPONINI in the last 168 hours. No results for input(s): TROPIPOC in the last 168 hours.  Chemistry Recent Labs  Lab 04/04/21 2055 04/05/21 0431 04/06/21 0013 04/07/21 0334 04/08/21 0329  NA 126*   < > 126* 127* 128*  K 4.9   < > 4.6 4.2 4.2  CL 92*   < > 93* 93* 96*  CO2 21*   < > '24 23 23  '$ GLUCOSE 78   < > 120* 147* 126*  BUN 109*   < > 108* 95* 93*  CREATININE 1.90*   < > 1.97* 1.61* 1.87*  CALCIUM 8.1*   < > 7.9* 7.9* 8.0*  PROT 5.8*  --   --   --   --   ALBUMIN 1.9*  --   --   --   --   AST 39  --   --   --   --   ALT 30  --   --   --   --   ALKPHOS 71  --   --   --   --   BILITOT 0.9  --   --   --   --   GFRNONAA 38*   < > 37*  47* 39*  ANIONGAP 13   < > '9 11 9   '$ < > = values in this interval not displayed.    Hematology Recent Labs  Lab 04/07/21 0334 04/07/21 1238 04/08/21 0329  WBC 9.8 10.1 8.5  RBC 2.85* 2.82* 2.53*  HGB 8.5* 8.4* 7.6*  HCT 27.1* 27.0* 24.1*  MCV 95.1 95.7 95.3  MCH 29.8 29.8 30.0  MCHC 31.4 31.1 31.5  RDW 17.1* 16.7* 16.9*  PLT 162 189 164   BNP Recent Labs  Lab 04/04/21 2055  BNP 731.8*    DDimer No results for input(s): DDIMER in the last 168 hours.   Radiology  No results found.  Cardiac Studies  TTE 03/26/2021  1. Left ventricular ejection fraction, by estimation, is 20%. The left  ventricle demonstrates global hypokinesis. The left ventricular internal  cavity size was mildly dilated. Left ventricular diastolic parameters are  indeterminate.   2. Right ventricular systolic function is moderately reduced. The right  ventricular size is mildly enlarged. Tricuspid regurgitation signal is  inadequate for assessing PA pressure.   3. Left atrial size was mildly dilated.   4. Doppler not assessed. No evidence of mitral stenosis. Moderate mitral  annular calcification.   5. Doppler not assessed. The aortic valve is tricuspid.   6. The inferior vena cava is normal in size with <50% respiratory  variability, suggesting right atrial pressure of 8 mmHg.   7. Limited echo. The patient was tachycardic in atrial fibrillation, rate  in 130s generally.   Patient Profile  [name] is a 65 y.o. male with CAD status post CABG, persistent atrial fibrillation, recent bradycardic arrest status post CRT-P, systolic heart failure (EF 20%, ICD referred), carotid artery disease status post left CEA, diabetes, developmental delay who was admitted on 04/05/2021 with nausea vomiting as well as volume overload concerning for acute on chronic systolic heart failure.  Assessment & Plan   #Acute on chronic systolic heart failure, EF 20%/hypotension -Admitted with acute  on chronic heart  failure as well as pneumonia. -Blood pressure is very soft. -Not a candidate for aggressive cardiovascular care as he is bedridden and with poor mobility and functional status. -He was being diuresed however his blood pressure was too low.  Sodium was improving. -Would recommend to hold his beta-blocker as well as Imdur and hydralazine.  He is not a candidate for ACE/ARB/Arni/MRA given significant CKD. -Recent admission for bradycardic arrest and was not a candidate for ICD.  He only underwent pacemaker implantation. -Given his very poor functional status and severe comorbidities he is not a candidate for advanced cardiac therapies.  He is not tolerating guideline directed medical therapy and is hypotensive.  This would be a reason to evaluate him for advanced therapies but he is not a candidate.  I really think he is approaching a palliative approach.  I would recommend palliative consult with aggressive discussion regarding goals of care.  From a heart standpoint he is hospice appropriate. -For now we will hold heart failure medications and see if blood pressure improves. -Not a candidate for digoxin given significant CKD.  #Persistent atrial fibrillation -Hold metoprolol.  Allow for lenient rate control. -Continue Eliquis.  #PNA -per primary team   For questions or updates, please contact [place] Please consult [email or web address] for contact info under   Time Spent with Patient: I have spent a total of 35 minutes with patient reviewing hospital notes, telemetry, EKGs, labs and examining the patient as well as establishing an assessment and plan that was discussed with the patient.  > 50% of time was spent in direct patient care.    Signed, Addison Naegeli. Audie Box, MD, [place]  04/08/2021 10:45 AM

## 2021-04-08 NOTE — Progress Notes (Signed)
Pharmacy Antibiotic Note  [name] is a 65 y.o. male admitted on 04/04/2021 with HF, nausea and AKI. There is concern for PNA and pharmacy consulted to dose cefepime and vancomycin.   - CT of chest: multifocal pneumonia  - Scr: 1.87 (recently 1.9-2.0) - WBC: 12.6 > 8.5, afebrile  8/30 Vancomycin load of '1750mg'$  IV x1 given, followed by '1250mg'$  IV q48hr (Estimated AUC= 505). Due to changed renal function, will change maintenance vancomycin dose to '1000mg'$  IV Q24H (estimated AUC 528, Vd 0.5 d/t BMI >30, Scr 1.87).    Plan: - Continue cefepime 2gm IV q12h - Change vancomycin to '1000mg'$  IV Q24H  - Will follow renal function, cultures and clinical progress   Height: 5' (152.4 cm) Weight: 86.4 kg (190 lb 7.6 oz) IBW/kg (Calculated) : 50  Temp (24hrs), Avg:97.7 F (36.5 C), Min:96 F (35.6 C), Max:99.1 F (37.3 C)  Recent Labs  Lab 04/04/21 2055 04/04/21 2233 04/05/21 0431 04/06/21 0013 04/07/21 0334 04/07/21 1238 04/08/21 0329  WBC 18.3*  --  12.7* 12.6* 9.8 10.1 8.5  CREATININE 1.90*  --  1.88* 1.97* 1.61*  --  1.87*  LATICACIDVEN 1.1 0.9  --  0.8  --   --   --      Estimated Creatinine Clearance: 35.5 mL/min (A) (by C-G formula based on SCr of 1.87 mg/dL (H)).    Allergies  Allergen Reactions   Penicillins Hives, Nausea And Vomiting, Swelling and Other (See Comments)    Tolerated Cefepime Has patient had a PCN reaction causing immediate rash, facial/tongue/throat swelling, SOB or lightheadedness with hypotension: YES Has patient had a PCN reaction causing severe rash involving mucus membranes or skin necrosis: No Has patient had a PCN reaction that required hospitalization No Has patient had a PCN reaction occurring within the last 10 years: No If all of the above answers are "NO", then may proceed with Cephalosporin use.     Thank you for allowing pharmacy to be a part of this patient's care.  Ardyth Harps, PharmD Clinical Pharmacist

## 2021-04-08 NOTE — Progress Notes (Signed)
Notified by nursing of persistent hypotension in [name].  Telemetry with nonsustained ventricular tachycardia.  He is drowsy and less responsive.  Heart failure medications were held this morning.  Given his multiple comorbidities and chronic debility he is not a candidate for advanced cardiovascular care as mentioned in my progress note from today.  His most recent hospitalization where multiple cardiologist were involved in his care deemed him not a candidate for invasive coronary angiography or even defibrillator therapy given his multiple comorbidities and unlikely benefit of aggressive cardiac care.  Given his persistent hypotension and increased drowsy state I did discuss his current condition with his sister [name] [id number].  I did inform her that we have plans to treat him and to determine if holding his medications will actually improve his blood pressure however I did inform her that he is not a candidate for advanced heart failure therapies including inotropes or mechanical support.  I did inform her that my recommendation was for him to be DO NOT RESUSCITATE with no escalation of care.  She reports that she would like to discuss this with her family members.  I did inform her that we have every intention to continue to treat him but heroic measures would not benefit him given the severity of his congestive heart failure and overall poor state of health.  She was thankful for the call will discuss with her family.    [place] T. Audie Box, MD, [place]  [address], [place], [place] [address] [phone]  1:02 PM

## 2021-04-08 NOTE — Consult Note (Signed)
NAME:  [name], MRN:  [id number], DOB:  05/02/1956, LOS: 3 ADMISSION DATE:  04/04/2021, CONSULTATION DATE:  8/31 REFERRING MD:  Dr. Claris Gower, CHIEF COMPLAINT:  Pneumonia   History of Present Illness:  65 year old male with complicated PMH as below, which is significant for developmental delay, large ventral hernia, CAD, COPD, Tachy/Brady syndrome, DM, PAF on DOAC, and recent admission 8/16 for cardiac arrest. Suffered multiple brief (10-20 seconds) arrests. Felt to be primarily arrhythmic in nature. Did not require intubation. He had permanent pacemaker placed 8/19 and was discharged to SNF on 8/26. He presented to [facility] ED from [facility] of 8/28 with complaints of nausea/vomiting, and SOB. He was admitted to the internal medicine teaching service for what was felt to be acute exacerbation of CHF. He was treated with diuresis and cardiology was consulted. CT of the abdomen was done as the result of ongoing abdominal pain (he notably has an extremely large ventral hernia with contains most of his large and small bowel). The CT demonstrated right sided pleural effusion and consolidation. Antibiotics were initiated and PCCM was consulted.   Pertinent  Medical History   has a past medical history of Abdominal hernia, Abscess, abdomen (12/31/2010), Anemia, Anxiety, AVM (arteriovenous malformation), Carotid artery disease ([place]), Chronic diastolic CHF (congestive heart failure) ([place]), Chronic low back pain, COPD (chronic obstructive pulmonary disease) ([place]), CVA (cerebral infarction) (09/2010), Diabetes mellitus, GERD (gastroesophageal reflux disease), History of nuclear stress test, echocardiogram, Hyperlipidemia, Hypertension, Intellectual disability, Itching, Lung nodule, Myocardial infarction ([place]) (2016 ?), Obesity, Oxygen dependent, PAF (paroxysmal atrial fibrillation) ([place]) (06/2009), Pneumonia, Stroke ([place]), Tobacco user, Tubular adenoma of colon, and Wears glasses.   Significant  Hospital Events: Including procedures, antibiotic start and stop dates in addition to other pertinent events   8/28 admit for ? CHF exacerbation 8/30 PCCM consulted for pneumonia  Interim History / Subjective:   Patient more somnolent today. Hemoglobin has trended down to 7 from 10 on admission. Blood pressures are soft this morning.  Objective   Blood pressure (!) 93/57, pulse 94, temperature 98.1 F (36.7 C), temperature source Oral, resp. rate 20, height 5' (1.524 m), weight 86.4 kg, SpO2 95 %.        Intake/Output Summary (Last 24 hours) at 04/08/2021 1251 Last data filed at 04/08/2021 1114 Gross per 24 hour  Intake 777 ml  Output 1350 ml  Net -573 ml   Filed Weights   04/07/21 0030 04/08/21 0050 04/08/21 0500  Weight: 88.8 kg 86.4 kg 86.4 kg    Examination: General: elderly male, chronically ill appearing. HEENT: [place]/AT, moist mucous membranes, sclera anicteric Neuro: somnolent, awakes to verbal stimuli CV: rrr, s1s2, no murmurs PULM: course breath sounds. No wheezing GI: soft, non-tender, non-distended, hernia present BS+ Extremities: warm, no edema Skin: no rashes, scattered ecchymoses   Resolved Hospital Problem list     Assessment & Plan:  Acute Hypoxemic respiratory Failure Healthcare associated pneumonia vs aspiration pneumonia Altered Mental Status Anemia of Chronic Disease Dense right lower lobe consolidation.  Patient is also a risk for aspiration based on SLP eval from 8/26 on his prior admission with recommendations for dysphagia 3 diet. MRSA PCR positive. Ipsilateral pleural effusion also present. Small by CT. Should it expand on future imaging, may need to be sampled.  - Continue empiric antibiotics cefepime, vanco, flagyl. Will add azithromycin for atypical coverage today. - Sputum culture if able. - Strep urine ag is negative. Legionella urinary antigen is pending.  - Exchange  home inhalers for nebs. Duoneb, budesonide. - Chest PT would be ideal,  -  Incentive spirometry, flutter valve.  - SLP recommended dysphagia 3 diet on previous admission. Consider re-involving SLP, as they mentioned barium study may be indicated.  - Check ABG and ammonia for patient's somnolence. Consider CT head imaging. - I ordered type and screen for patient's down trending H/H. Consider holding eliquis therapy.  Best Practice (right click and "Reselect all SmartList Selections" daily)   Diet/type: dysphagia diet (see orders) DVT prophylaxis: DOAC GI prophylaxis: PPI Lines: N/A Foley:  Yes, and it is still needed Code Status:  full code Last date of multidisciplinary goals of care discussion '[ ]'$   Labs   CBC: Recent Labs  Lab 04/04/21 2055 04/05/21 0431 04/06/21 0013 04/07/21 0334 04/07/21 1238 04/08/21 0329  WBC 18.3* 12.7* 12.6* 9.8 10.1 8.5  NEUTROABS 16.0*  --  10.8* 8.2*  --   --   HGB 9.2* 10.5* 8.7* 8.5* 8.4* 7.6*  HCT 28.8* 33.8* 27.7* 27.1* 27.0* 24.1*  MCV 96.0 97.1 94.5 95.1 95.7 95.3  PLT 195 153 181 162 [id number]    Basic Metabolic Panel: Recent Labs  Lab 04/04/21 2055 04/05/21 0431 04/06/21 0013 04/07/21 0334 04/08/21 0329  NA 126* 124* 126* 127* 128*  K 4.9 5.2* 4.6 4.2 4.2  CL 92* 92* 93* 93* 96*  CO2 21* 19* '24 23 23  '$ GLUCOSE 78 77 120* 147* 126*  BUN 109* 112* 108* 95* 93*  CREATININE 1.90* 1.88* 1.97* 1.61* 1.87*  CALCIUM 8.1* 7.9* 7.9* 7.9* 8.0*  MG  --  2.1  --   --   --    GFR: Estimated Creatinine Clearance: 35.5 mL/min (A) (by C-G formula based on SCr of 1.87 mg/dL (H)). Recent Labs  Lab 04/04/21 2055 04/04/21 2233 04/05/21 0431 04/06/21 0013 04/07/21 0334 04/07/21 1238 04/08/21 0329  PROCALCITON  --   --   --  0.69 0.44  --  0.33  WBC 18.3*  --    < > 12.6* 9.8 10.1 8.5  LATICACIDVEN 1.1 0.9  --  0.8  --   --   --    < > = values in this interval not displayed.    Liver Function Tests: Recent Labs  Lab 04/04/21 2055  AST 39  ALT 30  ALKPHOS 71  BILITOT 0.9  PROT 5.8*  ALBUMIN 1.9*    Recent Labs  Lab 04/04/21 2055  LIPASE 26   No results for input(s): AMMONIA in the last 168 hours.  ABG    Component Value Date/Time   PHART 7.405 09/25/2017 1633   PCO2ART 33.4 09/25/2017 1633   PO2ART 105.0 09/25/2017 1633   HCO3 17.9 (L) 03/23/2021 2235   TCO2 16 (L) 03/23/2021 1034   ACIDBASEDEF 8.2 (H) 03/23/2021 2235   O2SAT 45.3 03/23/2021 2235     Coagulation Profile: Recent Labs  Lab 04/04/21 2055 04/05/21 0431 04/07/21 1238  INR 2.2* 2.2* 2.1*    Cardiac Enzymes: No results for input(s): CKTOTAL, CKMB, CKMBINDEX, TROPONINI in the last 168 hours.  HbA1C: Hemoglobin A1C  Date/Time Value Ref Range Status  08/11/2015 12:00 AM 5.6  Final   Hgb A1c MFr Bld  Date/Time Value Ref Range Status  03/23/2021 02:51 PM 7.0 (H) 4.8 - 5.6 % Final    Comment:    (NOTE) Pre diabetes:          5.7%-6.4%  Diabetes:              >  6.4%  Glycemic control for   <7.0% adults with diabetes   09/28/2017 04:54 AM 7.9 (H) 4.8 - 5.6 % Final    Comment:    (NOTE) Pre diabetes:          5.7%-6.4% Diabetes:              >6.4% Glycemic control for   <7.0% adults with diabetes     CBG: Recent Labs  Lab 04/07/21 1927 04/08/21 0021 04/08/21 0423 04/08/21 0839 04/08/21 1211  GLUCAP 120* 128* 127* 119* [place], MD [facility] Office: [phone]   See Amion for personal pager PCCM on call pager [phone] until 7pm. Please call Elink 7p-7a. [phone]

## 2021-04-09 ENCOUNTER — Encounter (HOSPITAL_COMMUNITY): Payer: Medicare (Managed Care)

## 2021-04-09 ENCOUNTER — Ambulatory Visit: Payer: Medicare (Managed Care)

## 2021-04-09 DIAGNOSIS — J189 Pneumonia, unspecified organism: Secondary | ICD-10-CM | POA: Diagnosis not present

## 2021-04-09 DIAGNOSIS — Z515 Encounter for palliative care: Secondary | ICD-10-CM | POA: Diagnosis not present

## 2021-04-09 DIAGNOSIS — I5023 Acute on chronic systolic (congestive) heart failure: Secondary | ICD-10-CM | POA: Diagnosis not present

## 2021-04-09 LAB — CBC
HCT: 24.5 % — ABNORMAL LOW (ref 39.0–52.0)
Hemoglobin: 8.1 g/dL — ABNORMAL LOW (ref 13.0–17.0)
MCH: 30.6 pg (ref 26.0–34.0)
MCHC: 33.1 g/dL (ref 30.0–36.0)
MCV: 92.5 fL (ref 80.0–100.0)
Platelets: 150 10*3/uL (ref 150–400)
RBC: 2.65 MIL/uL — ABNORMAL LOW (ref 4.22–5.81)
RDW: 17.2 % — ABNORMAL HIGH (ref 11.5–15.5)
WBC: 6.7 10*3/uL (ref 4.0–10.5)
nRBC: 0 % (ref 0.0–0.2)

## 2021-04-09 LAB — BASIC METABOLIC PANEL
Anion gap: 8 (ref 5–15)
BUN: 93 mg/dL — ABNORMAL HIGH (ref 8–23)
CO2: 22 mmol/L (ref 22–32)
Calcium: 8 mg/dL — ABNORMAL LOW (ref 8.9–10.3)
Chloride: 98 mmol/L (ref 98–111)
Creatinine, Ser: 1.76 mg/dL — ABNORMAL HIGH (ref 0.61–1.24)
GFR, Estimated: 42 mL/min — ABNORMAL LOW (ref >60)
Glucose, Bld: 90 mg/dL (ref 70–99)
Potassium: 3.8 mmol/L (ref 3.5–5.1)
Sodium: 128 mmol/L — ABNORMAL LOW (ref 135–145)

## 2021-04-09 LAB — TYPE AND SCREEN
ABO/RH(D): B POS
Antibody Screen: NEGATIVE
Unit division: 0

## 2021-04-09 LAB — LEGIONELLA PNEUMOPHILA SEROGP 1 UR AG: L. pneumophila Serogp 1 Ur Ag: NEGATIVE

## 2021-04-09 LAB — BPAM RBC
Blood Product Expiration Date: 202209222359
ISSUE DATE / TIME: 202209011642
Unit Type and Rh: 7300

## 2021-04-09 LAB — GLUCOSE, CAPILLARY
Glucose-Capillary: 123 mg/dL — ABNORMAL HIGH (ref 70–99)
Glucose-Capillary: 82 mg/dL (ref 70–99)
Glucose-Capillary: 88 mg/dL (ref 70–99)
Glucose-Capillary: 89 mg/dL (ref 70–99)

## 2021-04-09 LAB — CULTURE, BLOOD (ROUTINE X 2)
Culture: NO GROWTH
Culture: NO GROWTH
Special Requests: ADEQUATE

## 2021-04-09 MED ORDER — LORAZEPAM 2 MG/ML PO CONC: 1.0000 mg | ORAL | Status: DC | PRN

## 2021-04-09 MED ORDER — HALOPERIDOL 0.5 MG PO TABS
0.5000 mg | ORAL_TABLET | ORAL | Status: DC | PRN
  Filled 2021-04-09: qty 1

## 2021-04-09 MED ORDER — AZITHROMYCIN 250 MG PO TABS: 250.0000 mg | ORAL_TABLET | Freq: Every day | ORAL | Status: DC

## 2021-04-09 MED ORDER — DIPHENHYDRAMINE HCL 50 MG/ML IJ SOLN: 12.5000 mg | INTRAMUSCULAR | Status: DC | PRN

## 2021-04-09 MED ORDER — LORAZEPAM 1 MG PO TABS: 1.0000 mg | ORAL_TABLET | ORAL | Status: DC | PRN

## 2021-04-09 MED ORDER — LORAZEPAM 2 MG/ML IJ SOLN: 1.0000 mg | INTRAMUSCULAR | Status: DC | PRN

## 2021-04-09 MED ORDER — ONDANSETRON HCL 4 MG/2ML IJ SOLN: 4.0000 mg | Freq: Four times a day (QID) | INTRAMUSCULAR | Status: DC | PRN

## 2021-04-09 MED ORDER — ACETAMINOPHEN 650 MG RE SUPP: 650.0000 mg | Freq: Four times a day (QID) | RECTAL | Status: DC | PRN

## 2021-04-09 MED ORDER — ACETAMINOPHEN 325 MG PO TABS: 650.0000 mg | ORAL_TABLET | Freq: Four times a day (QID) | ORAL | Status: DC | PRN

## 2021-04-09 MED ORDER — HALOPERIDOL LACTATE 2 MG/ML PO CONC
0.5000 mg | ORAL | Status: DC | PRN
  Filled 2021-04-09: qty 0.3

## 2021-04-09 MED ORDER — HALOPERIDOL LACTATE 5 MG/ML IJ SOLN: 0.5000 mg | INTRAMUSCULAR | Status: DC | PRN

## 2021-04-09 MED ORDER — GLYCOPYRROLATE 0.2 MG/ML IJ SOLN
0.2000 mg | INTRAMUSCULAR | Status: DC | PRN
  Administered 2021-04-10 – 2021-04-13 (×4): 0.2 mg via INTRAVENOUS
  Filled 2021-04-09 (×4): qty 1

## 2021-04-09 MED ORDER — GLYCOPYRROLATE 0.2 MG/ML IJ SOLN: 0.2000 mg | INTRAMUSCULAR | Status: DC | PRN

## 2021-04-09 MED ORDER — ONDANSETRON 4 MG PO TBDP: 4.0000 mg | ORAL_TABLET | Freq: Four times a day (QID) | ORAL | Status: DC | PRN

## 2021-04-09 MED ORDER — BIOTENE DRY MOUTH MT LIQD: 15.0000 mL | OROMUCOSAL | Status: DC | PRN

## 2021-04-09 MED ORDER — POLYVINYL ALCOHOL 1.4 % OP SOLN
1.0000 [drp] | Freq: Four times a day (QID) | OPHTHALMIC | Status: DC | PRN
  Filled 2021-04-09: qty 15

## 2021-04-09 MED ORDER — GLYCOPYRROLATE 1 MG PO TABS
1.0000 mg | ORAL_TABLET | ORAL | Status: DC | PRN
  Filled 2021-04-09: qty 1

## 2021-04-09 MED ORDER — HYDROMORPHONE HCL 1 MG/ML IJ SOLN
1.0000 mg | INTRAMUSCULAR | Status: DC | PRN
Start: 2021-04-09 — End: 2021-04-10
  Administered 2021-04-09: 1 mg via INTRAVENOUS
  Filled 2021-04-09: qty 1

## 2021-04-09 MED ORDER — VANCOMYCIN HCL IN DEXTROSE 1-5 GM/200ML-% IV SOLN: 1000.0000 mg | INTRAVENOUS | Status: DC

## 2021-04-09 MED ORDER — BISACODYL 10 MG RE SUPP: 10.0000 mg | Freq: Once | RECTAL | Status: DC

## 2021-04-09 NOTE — Plan of Care (Signed)
  Problem: Clinical Measurements: Goal: Respiratory complications will improve Outcome: Progressing   

## 2021-04-09 NOTE — Progress Notes (Signed)
Pharmacy Antibiotic Note  [name] is a 65 y.o. male on day #4 antibiotics for pnemonia.  Pharmacy has been consulted for Cefepime and Vancomycin dosing.  Also on Metronidazole and Azithromycin.   Creatinine flucuating some, using 1.8 for antibiotic dosing.  IV diuresis and BP meds stopped 9/1 due to hypotension. I/O positive so far today, UOP decreased some.  Plan: Continue Cefepime 2gm IV q12h Vancomycin 1gm IV q24 > to q36hrs Goal AUC 400-550. Expected AUC: 517 SCr used: 1.8, Vd used: 0.5 L/kg d/t BMI > 30 Follow renal function, clinical status. Noted plan for 7 days Vanc, Cefepime and Metronidazole and 5 days of Azithromycin. Defer Vanc levels since should only get a few more doses to complete 7 days.  Height: 5' (152.4 cm) Weight: 90 kg (198 lb 6.6 oz) IBW/kg (Calculated) : 50  Temp (24hrs), Avg:97.5 F (36.4 C), Min:97.1 F (36.2 C), Max:98.3 F (36.8 C)  Recent Labs  Lab 04/04/21 2055 04/04/21 2233 04/05/21 0431 04/06/21 0013 04/07/21 0334 04/07/21 1238 04/08/21 0329 04/08/21 1448 04/08/21 1616 04/08/21 2140 04/09/21 0407  WBC 18.3*  --    < > 12.6* 9.8 10.1 8.5 6.9  --   --  6.7  CREATININE 1.90*  --    < > 1.97* 1.61*  --  1.87*  --  1.94*  --  1.76*  LATICACIDVEN 1.1 0.9  --  0.8  --   --   --  0.8  --  0.7  --    < > = values in this interval not displayed.    Estimated Creatinine Clearance: 38.5 mL/min (A) (by C-G formula based on SCr of 1.76 mg/dL (H)).    Allergies  Allergen Reactions   Penicillins Hives, Nausea And Vomiting, Swelling and Other (See Comments)    Tolerated Cefepime Has patient had a PCN reaction causing immediate rash, facial/tongue/throat swelling, SOB or lightheadedness with hypotension: YES Has patient had a PCN reaction causing severe rash involving mucus membranes or skin necrosis: No Has patient had a PCN reaction that required hospitalization No Has patient had a PCN reaction occurring within the last 10 years: No If  all of the above answers are "NO", then may proceed with Cephalosporin use.     Antimicrobials this admission: Cefepime 8/30>> (expect thru 9/5) Vancomycin 8/30 >> (expect thru 9/5) Metronidazole PO 8/30>> (expect thru 9/5) Azithro IV on 9/1 > to PO 9/2 >> (9/5) Mupirocin nasal 8/31 > (9/4) Nystatin powder from PTA >>  Dose adjustments this admission:  9/1: Vanc changed 1250 q48  > 1gm q24h  9/2: Vanc chg'd to 1gm q24h > q36h for [facility]  Microbiology results: 8/28 Blood cx: negative 8/28 Urine cx: negative 8/30 MRSA PCR: positive 8/31 Legionella Ag: in process 8/31 Strep Ag: negative 8/16 (last admit): COVID: positive  Thank you for allowing pharmacy to be a part of this patient's care.  Arty Baumgartner, [place] 04/09/2021 1:16 PM

## 2021-04-09 NOTE — Care Management Important Message (Signed)
Important Message  Patient Details  Name: [name] MRN: [id number] Date of Birth: [birth year]   Medicare Important Message Given:  Yes     Shelda Altes 04/09/2021, 11:01 AM

## 2021-04-09 NOTE — Progress Notes (Signed)
Cardiology Progress Note  Patient ID: OMAREON [name] MRN: [id number] DOB: 10-25-1955 Date of Encounter: 04/09/2021  Primary Cardiologist: Mertie Moores, MD  Subjective   Chief Complaint: None.  HPI: Drowsy this morning.  Not tolerating guideline directed medical therapy.  Per discussions with family likely moving towards hospice care.  Not yet DNR/DNI but anticipate this today.  Aggressive cardiovascular care indicated.  This was reiterated to the family.  ROS:  All other ROS reviewed and negative. Pertinent positives noted in the HPI.     Inpatient Medications  Scheduled Meds:  sodium chloride   Intravenous Once   aspirin EC  81 mg Oral Daily   atorvastatin  80 mg Oral Daily   azithromycin  250 mg Oral Daily   budesonide (PULMICORT) nebulizer solution  0.5 mg Nebulization BID   Chlorhexidine Gluconate Cloth  6 each Topical Daily   insulin aspart  0-20 Units Subcutaneous Q4H   ipratropium-albuterol  3 mL Nebulization Q6H   metroNIDAZOLE  500 mg Oral Q8H   mupirocin ointment  1 application Nasal BID   nystatin  1 application Topical TID   pantoprazole  40 mg Oral Daily   PARoxetine  40 mg Oral Daily   senna  1 tablet Oral BID   tamsulosin  0.4 mg Oral Daily   Continuous Infusions:  ceFEPime (MAXIPIME) IV 2 g (04/09/21 [id number])   vancomycin 1,000 mg (04/08/21 1814)   PRN Meds: acetaminophen **OR** acetaminophen, nitroGLYCERIN, ondansetron **OR** ondansetron (ZOFRAN) IV, polyethylene glycol   Vital Signs   Vitals:   04/09/21 0900 04/09/21 0942 04/09/21 1000 04/09/21 1100  BP:  109/69    Pulse: 93 87 91   Resp: (!) '21 15 19 '$ (!) 24  Temp:  (!) 97.4 F (36.3 C)    TempSrc:      SpO2: 99%  98%   Weight:      Height:        Intake/Output Summary (Last 24 hours) at 04/09/2021 1131 Last data filed at 04/09/2021 0700 Gross per 24 hour  Intake 1069.74 ml  Output 500 ml  Net 569.74 ml   Last 3 Weights 04/09/2021 04/08/2021 04/08/2021  Weight (lbs) 198 lb 6.6 oz 190 lb 7.6 oz  190 lb 7.6 oz  Weight (kg) 90 kg 86.4 kg 86.4 kg      Telemetry  Overnight telemetry shows atrial fibrillation heart rate in the 90s to 110 bpm range, which I personally reviewed.   Physical Exam   Vitals:   04/09/21 0900 04/09/21 0942 04/09/21 1000 04/09/21 1100  BP:  109/69    Pulse: 93 87 91   Resp: (!) '21 15 19 '$ (!) 24  Temp:  (!) 97.4 F (36.3 C)    TempSrc:      SpO2: 99%  98%   Weight:      Height:        Intake/Output Summary (Last 24 hours) at 04/09/2021 1131 Last data filed at 04/09/2021 0700 Gross per 24 hour  Intake 1069.74 ml  Output 500 ml  Net 569.74 ml    Last 3 Weights 04/09/2021 04/08/2021 04/08/2021  Weight (lbs) 198 lb 6.6 oz 190 lb 7.6 oz 190 lb 7.6 oz  Weight (kg) 90 kg 86.4 kg 86.4 kg    Body mass index is 38.75 kg/m.   General: Ill-appearing Head: Atraumatic, normal size  Eyes: PEERLA, EOMI  Neck: Supple, JVD 7 8 cm water Endocrine: No thryomegaly Cardiac: Normal S1, S2; irregular rhythm Lungs: Diminished breath sounds bilaterally Abd:  Distended abdomen Ext: Trace edema Musculoskeletal: Status post right foot amputation Skin: Multiple wounds in the extremities noted Neuro: Drowsy, will awaken and follow commands  Labs  High Sensitivity Troponin:   Recent Labs  Lab 03/23/21 1451 04/04/21 2055 04/04/21 2233 04/05/21 0431 04/05/21 0727  TROPONINIHS 701* 200* 223* 243* 245*     Cardiac EnzymesNo results for input(s): TROPONINI in the last 168 hours. No results for input(s): TROPIPOC in the last 168 hours.  Chemistry Recent Labs  Lab 04/04/21 2055 04/05/21 0431 04/08/21 0329 04/08/21 1342 04/08/21 1616 04/09/21 0407  NA 126*   < > 128* 130* 131* 128*  K 4.9   < > 4.2 3.8 4.3 3.8  CL 92*   < > 96*  --  98 98  CO2 21*   < > 23  --  24 22  GLUCOSE 78   < > 126*  --  101* 90  BUN 109*   < > 93*  --  96* 93*  CREATININE 1.90*   < > 1.87*  --  1.94* 1.76*  CALCIUM 8.1*   < > 8.0*  --  8.0* 8.0*  PROT 5.8*  --   --   --   --   --   ALBUMIN  1.9*  --   --   --   --   --   AST 39  --   --   --   --   --   ALT 30  --   --   --   --   --   ALKPHOS 71  --   --   --   --   --   BILITOT 0.9  --   --   --   --   --   GFRNONAA 38*   < > 39*  --  37* 42*  ANIONGAP 13   < > 9  --  9 8   < > = values in this interval not displayed.    Hematology Recent Labs  Lab 04/08/21 0329 04/08/21 1342 04/08/21 1448 04/08/21 2140 04/09/21 0407  WBC 8.5  --  6.9  --  6.7  RBC 2.53*  --  2.32*  --  2.65*  HGB 7.6*   < > 7.0* 8.6* 8.1*  HCT 24.1*   < > 22.1* 25.7* 24.5*  MCV 95.3  --  95.3  --  92.5  MCH 30.0  --  30.2  --  30.6  MCHC 31.5  --  31.7  --  33.1  RDW 16.9*  --  17.0*  --  17.2*  PLT 164  --  168  --  150   < > = values in this interval not displayed.   BNP Recent Labs  Lab 04/04/21 2055  BNP 731.8*    DDimer No results for input(s): DDIMER in the last 168 hours.   Radiology  CT ABDOMEN PELVIS W CONTRAST  Result Date: 04/08/2021 CLINICAL DATA:  Nonlocalized acute abdominal pain. EXAM: CT ABDOMEN AND PELVIS WITH CONTRAST TECHNIQUE: Multidetector CT imaging of the abdomen and pelvis was performed using the standard protocol following bolus administration of intravenous contrast. CONTRAST:  32m OMNIPAQUE IOHEXOL 350 MG/ML SOLN COMPARISON:  CT abdomen pelvis 04/05/2021, CT abdomen pelvis 02/18/2010 FINDINGS: Lower chest: Persistent right lower lobe consolidation with air bronchograms. Passive atelectasis of left lower lobe. Interval decrease in size of a trace right pleural effusion. Interval development of a trace left pleural effusion. Partially visualized cardiac leads.  Hepatobiliary: No focal liver abnormality. No gallstones, gallbladder wall thickening, or pericholecystic fluid. No biliary dilatation. Pancreas: Diffusely atrophic. No focal lesion. Otherwise normal pancreatic contour. No surrounding inflammatory changes. No main pancreatic ductal dilatation. Spleen: Normal in size without focal abnormality. Adrenals/Urinary  Tract: No adrenal nodule bilaterally. Bilateral kidneys enhance symmetrically. There is a 1.3 cm lesion within the left kidney with a density of 34 Hounsfield units. Persistent trace nonspecific bilateral perinephric stranding. No hydronephrosis. No hydroureter. The urinary bladder is decompressed with a Foley catheter and tip terminating within its lumen. Urinary bladder luminal foci of gas likely due to instrumentation with Foley catheter. Stomach/Bowel: Stomach is within normal limits. No evidence of bowel wall thickening or dilatation. No pneumatosis. Appendix appears normal. Vascular/Lymphatic: No abdominal aorta or iliac aneurysm. Severe atherosclerotic plaque of the aorta and its branches. No abdominal, pelvic, or inguinal lymphadenopathy. Reproductive: Status post hysterectomy. No adnexal masses. Other: There is a 1.7 cm soft tissue density within the mesentery (3:80) that is new from 2011. There is an adjacent stable (at least from 2011) anterior 2.2 cm peripherally calcified lesion (3:78). Also noted is a stable (at least from 2011) subjacent calcification (3:86). No tethering of the mesentery associated with lesions. No intraperitoneal free fluid. No intraperitoneal free gas. No organized fluid collection. Musculoskeletal: Small volume left fat containing inguinal hernia. Trace right fat containing inguinal hernia. Similar-appearing lobulated large ventral hernia with an abdominal defect of 14 cm. The hernia is again noted to contain part of the stomach as well as majority of the small and large bowel. Persistent overlying mild subcutaneus soft tissue edema and dermal thickening of the pannus overlie the hernia. Some of the pannus is collimated off view. No organized fluid collection. No suspicious lytic or blastic osseous lesions. No acute displaced fracture. Redemonstration of marked dextroscoliosis of the thoracolumbar spine with associated multilevel degenerative changes of the spine. IMPRESSION: 1.  Persistent right lower lobe infection/inflammation. Followup PA and lateral chest X-ray is recommended in 3-4 weeks following therapy to ensure resolution and exclude underlying malignancy. 2. Interval decrease in size of a trace right pleural effusion. 3. Interval development of a trace left pleural effusion. 4. Similar-appearing large ventral wall hernia containing the majority of the bowel with no findings of bowel ischemia or obstruction. 5. Overlying partially visualized pannus demonstrates persistent subcutaneus soft tissue edema and dermal thickening. Visualized portions demonstrate no organized fluid collection. Recommend correlation with physical exam for cellulitis. 6. Bilateral trace to small volume left greater than right fat containing inguinal hernias. 7. Indeterminate 1.3 cm left renal lesion. Recommend MRI renal protocol for further evaluation. When the patient is clinically stable and able to follow directions and hold their breath (preferably as an outpatient) further evaluation with dedicated abdominal MRI should be considered. 8. Indeterminate 1.7 cm soft tissue density lesion within the mesentery that is new from 2011. Finding can further be evaluated on MRI renal protocol. Electronically Signed   By: Iven Finn M.D.   On: 04/08/2021 23:50    Cardiac Studies  TTE 03/26/2021  1. Left ventricular ejection fraction, by estimation, is 20%. The left  ventricle demonstrates global hypokinesis. The left ventricular internal  cavity size was mildly dilated. Left ventricular diastolic parameters are  indeterminate.   2. Right ventricular systolic function is moderately reduced. The right  ventricular size is mildly enlarged. Tricuspid regurgitation signal is  inadequate for assessing PA pressure.   3. Left atrial size was mildly dilated.   4. Doppler not assessed.  No evidence of mitral stenosis. Moderate mitral  annular calcification.   5. Doppler not assessed. The aortic valve is  tricuspid.   6. The inferior vena cava is normal in size with <50% respiratory  variability, suggesting right atrial pressure of 8 mmHg.   7. Limited echo. The patient was tachycardic in atrial fibrillation, rate  in 130s generally.   Patient Profile  LI ALOI is a 65 y.o. male with CAD status post CABG, persistent atrial fibrillation, recent bradycardic arrest status post CRT-P, systolic heart failure (EF 20%, ICD referred), carotid artery disease status post left CEA, diabetes, developmental delay who was admitted on 04/05/2021 with nausea vomiting as well as volume overload concerning for acute on chronic systolic heart failure.  Assessment & Plan   #End-stage systolic heart failure, EF 20% #Hypotension -Admitted with pneumonia as well as heart failure.  Blood pressure is very soft. -Not tolerating any guideline directed medical therapy. -Recent admission with bradycardia and VF arrest.  Felt to not be a candidate for ICD.  Pacer was placed for comfort. -Not tolerating any guideline directed medical therapy. -Given his hypotension right heart cath in aggressive cardiovascular care would be recommended however given his poor functional status and severe comorbidities including CKD, chronic wounds, bedridden status he is not a candidate for aggressive cardiovascular care.  Palliative inotropes not indicated.  He is not a candidate for LVAD or heart transplantation.  I have recommended hospice and palliative care.  I think he is a hospice appropriate candidate. -This was discussed yesterday with [name] and this was discussed again with her today.  She is in agreement.  Apparently there will be a family discussion today. -For now would recommend continue to hold heart failure medications.  The focus should be on comfort.  #Persistent atrial fibrillation -Metoprolol being held. -Eliquis being held in setting of anemia. -No indication to continue cardiac medications.  The  cardiology service will sign off at this time.  Family is likely moving towards hospice care.  There will be a family meeting today.  Cardiology available if needed.  However have already given my recommendations to family today.  For questions or updates, please contact [address] Please consult [email or web address] for contact info under   Time Spent with Patient: I have spent a total of 25 minutes with patient reviewing hospital notes, telemetry, EKGs, labs and examining the patient as well as establishing an assessment and plan that was discussed with the patient.  > 50% of time was spent in direct patient care.    Signed, Addison Naegeli. Audie Box, MD, [place]  04/09/2021 11:31 AM

## 2021-04-09 NOTE — Progress Notes (Signed)
UPDATE:  Messaged by RN this afternoon that family would like to transition to comfort care. Met with Mr. Mabee family, including power of attorney, [name] (sister), and multiple other family members. [name] informed me that family would like to transition code status to DO NOT RESUSCITATE. I explained that if Mr. Torr had an arrest, we would not perform CPR and would let the natural dying process proceed. Ms. Autwell and family verbalized understanding and agreed.  We also discussed Mr. Spratling's current condition. Explained that currently his heart is very weak and we are unable to diurese due to hypotension. Also explained that his heart condition is terminal, as there are no available advanced therapies for [name]. At this time, family would like to pursue comfort care measures. I explained that we will stop all monitors and current treatments and would focus only on keeping Mr. Arenz comfortable. Ms. Autwell and family verbalized understanding and agreed.   At this time, patient's family would like to speak with CSW/CM for hospice options. Comfort care orders have been placed and code status has been changed to DO NOT RESUSCITATE.  - Dilaudid 31m q4h PRN for severe pain - Ativan 14mq4h PRN for anxiety - Haldol 0.73m80m4h PRN for agitation - Glycopyrrolate 1mg7mh PRN for excessive secretions - Please allow family members at bedside for end of life  [facility] PGY-2 [phone]

## 2021-04-09 NOTE — Progress Notes (Signed)
RT NOTE: CPT held at this time as patient just finished eating breakfast. Nebulizer breathing treatments given. Vitals are stable. RT will continue to monitor.

## 2021-04-09 NOTE — Progress Notes (Addendum)
HD#4 Subjective:  Overnight Events: Unremarkable  I have seen and evaluated [name] with attending Dr. Evette Doffing at bedside. Patient was A&Ox4 and accompanied by niece. Patient reported feeling much improved and slept well with his CPAP. He denied any pains, and wants to go home. Discussed with patient and niece the difficulty of maintaining adequate blood pressure and achieving a euvolemic state. The team suggested to initiate the conversation of palliative care due to the severity of the conditions and the effect on quality of life of the patient.    Objective:  Vital signs in last 24 hours: Vitals:   04/09/21 0942 04/09/21 1000 04/09/21 1100 04/09/21 1349  BP: 109/69     Pulse: 87 91  97  Resp: 15 19 (!) 24 19  Temp: (!) 97.4 F (36.3 C)     TempSrc:      SpO2:  98%  96%  Weight:      Height:       Supplemental O2: Nasal Cannula SpO2: 96 % O2 Flow Rate (L/min): 3 L/min FiO2 (%): 32 %   Physical Exam:  Physical Exam Vitals and nursing note reviewed.  Constitutional:      Appearance: He is ill-appearing.  HENT:     Head: Normocephalic and atraumatic.  Eyes:     Extraocular Movements: Extraocular movements intact.  Cardiovascular:     Rate and Rhythm: Normal rate.     Heart sounds: No murmur heard.   No friction rub. No gallop.  Pulmonary:     Breath sounds: No wheezing.     Comments: Coarse breath sounds bilaterally Abdominal:     Palpations: Abdomen is soft.     Tenderness: There is no abdominal tenderness.  Musculoskeletal:     Right lower leg: Edema present.     Left lower leg: Edema present.  Skin:    Coloration: Skin is pale.     Findings: Bruising present.  Neurological:     Mental Status: He is alert and oriented to person, place, and time.    Filed Weights   04/08/21 0050 04/08/21 0500 04/09/21 0500  Weight: 86.4 kg 86.4 kg 90 kg     Intake/Output Summary (Last 24 hours) at 04/09/2021 1525 Last data filed at 04/09/2021 0700 Gross per 24  hour  Intake 949.74 ml  Output 500 ml  Net 449.74 ml   Net IO Since Admission: -2,346.99 mL [04/09/21 1525]  Pertinent Labs: CBC Latest Ref Rng & Units 04/09/2021 04/08/2021 04/08/2021  WBC 4.0 - 10.5 K/uL 6.7 - 6.9  Hemoglobin 13.0 - 17.0 g/dL 8.1(L) 8.6(L) 7.0(L)  Hematocrit 39.0 - 52.0 % 24.5(L) 25.7(L) 22.1(L)  Platelets 150 - 400 K/uL 150 - 168    CMP Latest Ref Rng & Units 04/09/2021 04/08/2021 04/08/2021  Glucose 70 - 99 mg/dL 90 101(H) -  BUN 8 - 23 mg/dL 93(H) 96(H) -  Creatinine 0.61 - 1.24 mg/dL 1.76(H) 1.94(H) -  Sodium 135 - 145 mmol/L 128(L) 131(L) 130(L)  Potassium 3.5 - 5.1 mmol/L 3.8 4.3 3.8  Chloride 98 - 111 mmol/L 98 98 -  CO2 22 - 32 mmol/L 22 24 -  Calcium 8.9 - 10.3 mg/dL 8.0(L) 8.0(L) -  Total Protein 6.5 - 8.1 g/dL - - -  Total Bilirubin 0.3 - 1.2 mg/dL - - -  Alkaline Phos 38 - 126 U/L - - -  AST 15 - 41 U/L - - -  ALT 0 - 44 U/L - - -  Assessment/Plan:   Principal Problem:   End of life care Active Problems:   DM (diabetes mellitus), type 2, uncontrolled ([place])   PAF (paroxysmal atrial fibrillation) ([facility])   COPD (chronic obstructive pulmonary disease) ([facility])   CAD (coronary artery disease)   Right lower lobe pneumonia   S/P placement of cardiac pacemaker   Acute on chronic HFrEF (heart failure with reduced ejection fraction) ([place])   Patient Summary: [name] is a 65 y.o. 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  #End-of-life care #Comfort care measures Family meeting on 9/2 confirming goals of care to focus on patient's comfort only.  Due to hypotension, recurring anemia,  massive volume overload, very little oral intake, anticipate death within 2 weeks.  Will work with family about disposition, I anticipate he would be best cared for in inpatient hospice.  We will treat pain with Dilaudid as needed given renal dysfunction.  #Acute on chronic combined systolic and diastolic heart failure #Persisitent hypotension  Patient is massively volume overloaded, we are unable to diurese due to hypotension.  Also unable to use any evidence-based medications due to hypotension and renal disease.  Patient is not a candidate for advanced heart failure therapies  #Acute on blood loss anemia Source of bleeding is most likely diffuse oozing as evidenced by his diffuse cutaneous purpura.  No intra-abdominal bleeding on CT abdomen, no GI bleeding noted.  Holding anticoagulation, had a blood transfusion on 9/1, no further transfusions planned given new goals of care.  Right lower lobe pneumonia Etiology likely due to aspiration events.  Did not respond to antibiotics.  We will discontinue those antibiotics at this time.    Urinary retention #Hematuria-resolved Foley catheter for comfort care  Diet: Carb/Renal VTE: None Code: Full PT/OT recs: SNF for Subacute PT, none.  Dispo: Anticipated discharge to inpatient hospice  Joseph Brittle, DO 04/09/2021, 3:25 PM Pager: [phone]  Please contact the on call pager after 5 pm and on weekends at [phone].

## 2021-04-09 NOTE — Progress Notes (Signed)
NAME:  [name], MRN:  [id number], DOB:  1956/01/15, LOS: 4 ADMISSION DATE:  04/04/2021, CONSULTATION DATE:  8/31 REFERRING MD:  Dr. Claris Gower, CHIEF COMPLAINT:  Pneumonia   History of Present Illness:  65 year old male with complicated PMH as below, which is significant for developmental delay, large ventral hernia, CAD, COPD, Tachy/Brady syndrome, DM, PAF on DOAC, and recent admission 8/16 for cardiac arrest. Suffered multiple brief (10-20 seconds) arrests. Felt to be primarily arrhythmic in nature. Did not require intubation. He had permanent pacemaker placed 8/19 and was discharged to SNF on 8/26. He presented to [facility] from [facility] of 8/28 with complaints of nausea/vomiting, and SOB. He was admitted to the internal medicine teaching service for what was felt to be acute exacerbation of CHF. He was treated with diuresis and cardiology was consulted. CT of the abdomen was done as the result of ongoing abdominal pain (he notably has an extremely large ventral hernia with contains most of his large and small bowel). The CT demonstrated right sided pleural effusion and consolidation. Antibiotics were initiated and PCCM was consulted.   Pertinent  Medical History   has a past medical history of Abdominal hernia, Abscess, abdomen (12/31/2010), Anemia, Anxiety, AVM (arteriovenous malformation), Carotid artery disease ([place]), Chronic diastolic CHF (congestive heart failure) ([place]), Chronic low back pain, COPD (chronic obstructive pulmonary disease) ([place]), CVA (cerebral infarction) (09/2010), Diabetes mellitus, GERD (gastroesophageal reflux disease), History of nuclear stress test, echocardiogram, Hyperlipidemia, Hypertension, Intellectual disability, Itching, Lung nodule, Myocardial infarction ([place]) (2016 ?), Obesity, Oxygen dependent, PAF (paroxysmal atrial fibrillation) ([place]) (06/2009), Pneumonia, Stroke ([place]), Tobacco user, Tubular adenoma of colon, and Wears glasses.   Significant  Hospital Events: Including procedures, antibiotic start and stop dates in addition to other pertinent events   8/28 admit for ? CHF exacerbation 8/30 PCCM consulted for pneumonia  Interim History / Subjective:   Patient is a little more awake this morning.  Received 1 unit of PRBCs yesterday CT abdomen negative for RP bleed   Objective   Blood pressure 109/69, pulse 87, temperature (!) 97.4 F (36.3 C), resp. rate 15, height 5' (1.524 m), weight 90 kg, SpO2 100 %.    FiO2 (%):  [32 %] 32 %   Intake/Output Summary (Last 24 hours) at 04/09/2021 1030 Last data filed at 04/09/2021 0700 Gross per 24 hour  Intake 1069.74 ml  Output 750 ml  Net 319.74 ml   Filed Weights   04/08/21 0050 04/08/21 0500 04/09/21 0500  Weight: 86.4 kg 86.4 kg 90 kg    Examination: General: elderly male, chronically ill appearing. HEENT: [place]/AT, moist mucous membranes, sclera anicteric Neuro: somnolent, awakes to verbal stimuli CV: rrr, s1s2, no murmurs PULM: course breath sounds. No wheezing GI: soft, non-tender, non-distended, hernia present BS+ Extremities: warm, no edema Skin: no rashes, scattered ecchymoses   Resolved Hospital Problem list     Assessment & Plan:  Acute Hypoxemic respiratory Failure Healthcare associated pneumonia vs aspiration pneumonia Altered Mental Status Anemia of Chronic Disease Chronic Heart failure  Dense right lower lobe consolidation.  Patient is also a risk for aspiration based on SLP eval from 8/26 on his prior admission with recommendations for dysphagia 3 diet. MRSA PCR positive. Ipsilateral pleural effusion also present. Small by CT. Should it expand on future imaging, may need to be sampled.  - Continue empiric antibiotics cefepime, vanco, flagyl for 7 days and azithromycin for 5 days. - Sputum culture if able. - Strep urine ag is  negative. Legionella urinary antigen is pending.  - Exchange home inhalers for nebs. Duoneb, budesonide. - Chest PT per RT -  Incentive spirometry, flutter valve.  - SLP recommended dysphagia 3 diet on previous admission. Consider re-involving SLP, as they mentioned barium study may be indicated.  - Will need CT chest scan follow up in 4-6 weeks for monitoring of resolution of the right lower lobe consolidation.  PCCM will sign off. Please call with any questions. We do recommend that he be DNR/DNI given his significant comorbid conditions.  Best Practice (right click and "Reselect all SmartList Selections" daily)   Diet/type: dysphagia diet (see orders) DVT prophylaxis: DOAC GI prophylaxis: PPI Lines: N/A Foley:  Yes, and it is still needed Code Status:  full code Last date of multidisciplinary goals of care discussion '[ ]'$   Labs   CBC: Recent Labs  Lab 04/04/21 2055 04/05/21 0431 04/06/21 0013 04/07/21 0334 04/07/21 1238 04/08/21 0329 04/08/21 1342 04/08/21 1448 04/08/21 2140 04/09/21 0407  WBC 18.3*   < > 12.6* 9.8 10.1 8.5  --  6.9  --  6.7  NEUTROABS 16.0*  --  10.8* 8.2*  --   --   --   --   --   --   HGB 9.2*   < > 8.7* 8.5* 8.4* 7.6* 6.1* 7.0* 8.6* 8.1*  HCT 28.8*   < > 27.7* 27.1* 27.0* 24.1* 18.0* 22.1* 25.7* 24.5*  MCV 96.0   < > 94.5 95.1 95.7 95.3  --  95.3  --  92.5  PLT 195   < > 181 162 189 164  --  168  --  150   < > = values in this interval not displayed.    Basic Metabolic Panel: Recent Labs  Lab 04/05/21 0431 04/06/21 0013 04/07/21 0334 04/08/21 0329 04/08/21 1342 04/08/21 1616 04/09/21 0407  NA 124* 126* 127* 128* 130* 131* 128*  K 5.2* 4.6 4.2 4.2 3.8 4.3 3.8  CL 92* 93* 93* 96*  --  98 98  CO2 19* '24 23 23  '$ --  24 22  GLUCOSE 77 120* 147* 126*  --  101* 90  BUN 112* 108* 95* 93*  --  96* 93*  CREATININE 1.88* 1.97* 1.61* 1.87*  --  1.94* 1.76*  CALCIUM 7.9* 7.9* 7.9* 8.0*  --  8.0* 8.0*  MG 2.1  --   --   --   --  2.1  --   PHOS  --   --   --   --   --  4.4  --    GFR: Estimated Creatinine Clearance: 38.5 mL/min (A) (by C-G formula based on SCr of 1.76  mg/dL (H)). Recent Labs  Lab 04/04/21 2233 04/05/21 0431 04/06/21 0013 04/07/21 0334 04/07/21 1238 04/08/21 0329 04/08/21 1448 04/08/21 2140 04/09/21 0407  PROCALCITON  --   --  0.69 0.44  --  0.33  --   --   --   WBC  --    < > 12.6* 9.8 10.1 8.5 6.9  --  6.7  LATICACIDVEN 0.9  --  0.8  --   --   --  0.8 0.7  --    < > = values in this interval not displayed.    Liver Function Tests: Recent Labs  Lab 04/04/21 2055  AST 39  ALT 30  ALKPHOS 71  BILITOT 0.9  PROT 5.8*  ALBUMIN 1.9*   Recent Labs  Lab 04/04/21 2055  LIPASE 26   Recent  Labs  Lab 04/08/21 1448  AMMONIA 17    ABG    Component Value Date/Time   PHART 7.592 (H) 04/08/2021 1342   PCO2ART 26.8 (L) 04/08/2021 1342   PO2ART 183 (H) 04/08/2021 1342   HCO3 25.9 04/08/2021 1342   TCO2 27 04/08/2021 1342   ACIDBASEDEF 8.2 (H) 03/23/2021 2235   O2SAT 100.0 04/08/2021 1342     Coagulation Profile: Recent Labs  Lab 04/04/21 2055 04/05/21 0431 04/07/21 1238 04/08/21 1448  INR 2.2* 2.2* 2.1* 2.3*    Cardiac Enzymes: No results for input(s): CKTOTAL, CKMB, CKMBINDEX, TROPONINI in the last 168 hours.  HbA1C: Hemoglobin A1C  Date/Time Value Ref Range Status  08/11/2015 12:00 AM 5.6  Final   Hgb A1c MFr Bld  Date/Time Value Ref Range Status  03/23/2021 02:51 PM 7.0 (H) 4.8 - 5.6 % Final    Comment:    (NOTE) Pre diabetes:          5.7%-6.4%  Diabetes:              >6.4%  Glycemic control for   <7.0% adults with diabetes   09/28/2017 04:54 AM 7.9 (H) 4.8 - 5.6 % Final    Comment:    (NOTE) Pre diabetes:          5.7%-6.4% Diabetes:              >6.4% Glycemic control for   <7.0% adults with diabetes     CBG: Recent Labs  Lab 04/08/21 1605 04/08/21 2104 04/09/21 0014 04/09/21 0436 04/09/21 0805  GLUCAP [place]* [phone]    Freda Jackson, MD [facility] Office: [phone]   See Amion for personal pager PCCM on call pager [phone] until  7pm. Please call Elink 7p-7a. [phone]

## 2021-04-10 DIAGNOSIS — Z515 Encounter for palliative care: Secondary | ICD-10-CM | POA: Diagnosis not present

## 2021-04-10 DIAGNOSIS — Z7189 Other specified counseling: Secondary | ICD-10-CM

## 2021-04-10 MED ORDER — HYDROMORPHONE HCL 1 MG/ML IJ SOLN
1.0000 mg | INTRAMUSCULAR | Status: DC | PRN
  Administered 2021-04-10 – 2021-04-13 (×4): 1 mg via INTRAVENOUS
  Filled 2021-04-10 (×4): qty 1

## 2021-04-10 MED ORDER — LORAZEPAM 1 MG PO TABS: 1.0000 mg | ORAL_TABLET | ORAL | Status: DC | PRN

## 2021-04-10 MED ORDER — LORAZEPAM 2 MG/ML IJ SOLN: 1.0000 mg | INTRAMUSCULAR | Status: DC | PRN

## 2021-04-10 NOTE — Consult Note (Signed)
Palliative Medicine Inpatient Consult Note  Consulting Provider: Merrily Brittle, DO  Reason for consult:   Palliative Care Consult Services Palliative Medicine Consult   Symptom Management Consult  Reason for Consult? End of life care please    HPI:  Per intake H&P --> [name] is a 65 y.o. with a pertinent PMH of mild developmental delay, HTN, HLD, Afib on Eliquis, tachy-brady syndrome (s/p temp pacemaker on 03/26/21), chronic combined systolic/diastolic HF (EF 82%), CAD (MI 2016, s/p CABG 2017), CVA (2012), T2DM (c/b diabetic neuropathy, s/p R transmetatarsal amputation), CKD stage IIIB, COPD  who presented for nausea/vomiting and was admitted for heart failure exacerbation, hyponatremia and AKI.  Palliative care was asked to get involved to aid in additional symptom management in the setting of end-of-life care.  We have also been asked to help discuss the transition from the hospital to hospice.  Clinical Assessment/Goals of Care:  *Please note that this is a verbal dictation therefore any spelling or grammatical errors are due to the "[place]" system interpretation.  I have reviewed medical records including EPIC notes, labs and imaging, received report from bedside RN, assessed the patient is lying in bed drinking Dr. Malachi Bonds in no acute distress.   I met with Steele Berg and called his sister, [name] to further discuss diagnosis prognosis, GOC, EOL wishes, disposition and options.  A brief review of Savion's past medical history was completed inclusive of his history of atrial fibrillation, end-stage heart failure, myocardial infarction, CVA, diabetes with associated neuropathy, and COPD.  Demaree shares with me that he know' he is "a sick man".   I introduced Palliative Medicine as specialized medical care for people living with serious illness. It focuses on providing relief from the symptoms and stress of a serious illness. The goal is to improve quality of  life for both the patient and the family.  A brief life review was completed.  Spenser is from [place], [place].  He has been born and raised in this local area.  He has never been married nor has any children.  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  For recreation he enjoys playing bingo and going to the pace center.  He is a man of faith and practices within the [employer] denomination.  Prior to hospitalization, Behr had been living with his Florentina Jenny.  He had been reliant on her for all basic activities of daily living.  A detailed discussion was had today regarding advanced directives there are none on file though Constant shares that his sister, [name] helps with his decision making.    Concepts specific to code status, artifical feeding and hydration, continued IV antibiotics and rehospitalization was had.  We reviewed that Yeshaya is DO NOT INTUBATE, DO NOT RESUSCITATE CODE STATUS.  His goals at this point in time are to be made comfortable and to pass peacefully.    The difference between a aggressive medical intervention path  and a palliative comfort care path for this patient at this time was had.  Dagon Budai and his family made the determination to focus only on comfort.  In regard to transitions, Mitch presently lives with his sister as above though per discussions with [name] this morning she does not know if she will be able to accommodate caring for his 24/7 needs during his end-of-life journey.  We reviewed that there may be the option of inpatient hospice though his chart will  need to be reviewed by the medical director of [place].  [name] shares understanding of this.  At this point in time we will continue targeted comfort and symptom management.  Discussed the importance of continued conversation with family and their  medical providers regarding overall plan of care and treatment options, ensuring decisions are within the context  of the patients values and GOCs.  Decision Maker: [name] (sister): [phone]  SUMMARY OF RECOMMENDATIONS   DNAR/DNI  Comfort Focused Care  Comfort medications per [facility] - Liberalized PRN frequency of dilaudid and ativan  Unrestricted visitation   Per conversation with patients sister, [name] she does not think she can accommodate the 24/7 needs of Avraj in the home. TOC has been alerted for inpatient hospice referrals.  Ongoing PMT support  Code Status/Advance Care Planning: DNAR/DNI    Palliative Prophylaxis:  Oral Care, Turn Q2H   Additional Recommendations (Limitations, Scope, Preferences):  Comfort care   Psycho-social/Spiritual:  Desire for further Chaplaincy support: No Additional Recommendations: Continue current plan of care   Prognosis: Likely days.  Discharge Planning: Discharge plan is unclear  Vitals:   04/09/21 1400 04/09/21 1926  BP: (!) 121/18 (!) 91/56  Pulse: 99 83  Resp: (!) 24 15  Temp:  98.1 F (36.7 C)  SpO2: 100% (!) 85%    Intake/Output Summary (Last 24 hours) at 04/10/2021 0700 Last data filed at 04/09/2021 1800 Gross per 24 hour  Intake 400 ml  Output 800 ml  Net -400 ml   Last Weight  Most recent update: 04/10/2021  4:32 AM    Weight  89.2 kg (196 lb 10.4 oz)            Gen: Older obese M in NAD HEENT: moist mucous membranes CV: Regular rate and irregular rhythm PULM: On 3LPM [place] ABD: soft/nontender  EXT: Generalized edema Neuro: Alert and oriented x2-3  PPS: 20%   This conversation/these recommendations were discussed with patient primary care team.  Time In: 0700 Time Out: 0810 Total Time: 70 Greater than 50%  of this time was spent counseling and coordinating care related to the above assessment and plan.  [place] Team Team Cell Phone: [phone] Please utilize secure chat with additional questions, if there is no response within 30 minutes please call the above  phone number  Palliative Medicine Team providers are available by phone from 7am to 7pm daily and can be reached through the team cell phone.  Should this patient require assistance outside of these hours, please call the patient's attending physician.

## 2021-04-10 NOTE — Progress Notes (Signed)
HD#5 Subjective:  Overnight Events: Unremarkable  Patient has been seen and evaluated with attending Dr. Evette Doffing at bedside.  Patient was drowsy, however sitting up comfortably and denied pain. Patient had not other requests or complaints.  Objective:  Vital signs in last 24 hours: Vitals:   04/09/21 1349 04/09/21 1400 04/09/21 1926 04/10/21 0300  BP:  (!) 121/18 (!) 91/56   Pulse: 97 99 83   Resp: 19 (!) 24 15   Temp:   98.1 F (36.7 C)   TempSrc:   Oral   SpO2: 96% 100% (!) 85%   Weight:    89.2 kg  Height:       Supplemental O2: Nasal Cannula SpO2: (!) 85 % O2 Flow Rate (L/min): 3 L/min FiO2 (%): 32 %   Physical Exam:  Physical Exam Vitals and nursing note reviewed.  Constitutional:      Appearance: He is ill-appearing.     Comments: Drowsy  HENT:     Head: Normocephalic and atraumatic.  Eyes:     Extraocular Movements: Extraocular movements intact.  Cardiovascular:     Rate and Rhythm: Normal rate.     Heart sounds: No murmur heard.   No friction rub. No gallop.  Pulmonary:     Breath sounds: No wheezing.     Comments: Coarse breath sounds bilaterally Abdominal:     Palpations: Abdomen is soft.     Tenderness: There is no abdominal tenderness.  Musculoskeletal:     Right lower leg: Edema present.     Left lower leg: Edema present.  Skin:    Coloration: Skin is pale.     Findings: Bruising present.    Filed Weights   04/08/21 0500 04/09/21 0500 04/10/21 0300  Weight: 86.4 kg 90 kg 89.2 kg     Intake/Output Summary (Last 24 hours) at 04/10/2021 1314 Last data filed at 04/10/2021 0850 Gross per 24 hour  Intake 200 ml  Output 1000 ml  Net -800 ml   Net IO Since Admission: -2,946.99 mL [04/10/21 1314]  Pertinent Labs: CBC Latest Ref Rng & Units 04/09/2021 04/08/2021 04/08/2021  WBC 4.0 - 10.5 K/uL 6.7 - 6.9  Hemoglobin 13.0 - 17.0 g/dL 8.1(L) 8.6(L) 7.0(L)  Hematocrit 39.0 - 52.0 % 24.5(L) 25.7(L) 22.1(L)  Platelets 150 - 400 K/uL 150 - 168     CMP Latest Ref Rng & Units 04/09/2021 04/08/2021 04/08/2021  Glucose 70 - 99 mg/dL 90 101(H) -  BUN 8 - 23 mg/dL 93(H) 96(H) -  Creatinine 0.61 - 1.24 mg/dL 1.76(H) 1.94(H) -  Sodium 135 - 145 mmol/L 128(L) 131(L) 130(L)  Potassium 3.5 - 5.1 mmol/L 3.8 4.3 3.8  Chloride 98 - 111 mmol/L 98 98 -  CO2 22 - 32 mmol/L 22 24 -  Calcium 8.9 - 10.3 mg/dL 8.0(L) 8.0(L) -  Total Protein 6.5 - 8.1 g/dL - - -  Total Bilirubin 0.3 - 1.2 mg/dL - - -  Alkaline Phos 38 - 126 U/L - - -  AST 15 - 41 U/L - - -  ALT 0 - 44 U/L - - -     Assessment/Plan:   Principal Problem:   End of life care Active Problems:   DM (diabetes mellitus), type 2, uncontrolled (HCC)   PAF (paroxysmal atrial fibrillation) (HCC)   COPD (chronic obstructive pulmonary disease) (HCC)   CAD (coronary artery disease)   Right lower lobe pneumonia   S/P placement of cardiac pacemaker   Acute on chronic HFrEF (heart failure  with reduced ejection fraction) ([place])   Patient Summary: [name] is a 65 y.o. 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  #End-of-life care #Comfort care measures Family meeting on 9/2 confirming goals of care to focus on patient's comfort only.  Due to hypotension, recurring anemia, massive volume overload, very little oral intake, anticipate death within 2 weeks. We will treat pain with Dilaudid as needed given renal dysfunction.  #Acute on chronic combined systolic and diastolic heart failure #Persisitent hypotension  Patient is massively volume overloaded, we are unable to diurese due to hypotension.  Also unable to use any  evidence-based medications due to hypotension and renal disease.  Patient is not a candidate for advanced heart failure therapies.  #Acute on blood loss anemia Source of bleeding is most likely diffuse oozing as evidenced by his diffuse cutaneous purpura.  No intra-abdominal bleeding on CT abdomen, no GI bleeding noted.  Holding anticoagulation, had a blood transfusion on 9/1, no further transfusions planned given new goals of care.  #Right lower lobe pneumonia Etiology likely due to aspiration events.  Did not respond to antibiotics.  We will discontinue those antibiotics at this time.    #Urinary retention Foley catheter for comfort care  Diet: Comfort Care VTE: None Code: DNR PT/OT recs: SNF for Subacute PT, none.  Dispo: Anticipated discharge to inpatient hospice at [facility]  Merrily Brittle, [place] 04/10/2021, 1:14 PM Pager: [phone]  Please contact the on call pager after 5 pm and on weekends at [phone].

## 2021-04-10 NOTE — Progress Notes (Signed)
Manufacturing engineer [facility])  Request received to meet with family and answer questions surrounding hospice and [facility].  The family is most interested in [facility] for EOL care. Discussed admission criteria and that [name] may not be < 2 weeks. Approval will be needed from [facility] MD.  Should he not be approved, he is a current [facility] patient and [facility] would serve them with hospice in the home until he is deemed < 2 weeks, at which time we contract EOL care with [facility] patients.   He lives with his sister [name] and although she cannot care for him in his current state, they have a very large extended family that would likely be able to help is he is not eligible for [facility].  Thank you, Venia Carbon RN, BSN, [facility] Liaison

## 2021-04-10 NOTE — Progress Notes (Signed)
[facility])  Mr. Mccrary is approved for residential hospice at [facility].  Once a bed is available, [facility] will update family and hospital staff.  Venia Carbon RN, BSN, [facility] Liaison

## 2021-04-11 DIAGNOSIS — Z515 Encounter for palliative care: Secondary | ICD-10-CM | POA: Diagnosis not present

## 2021-04-11 NOTE — Progress Notes (Signed)
   Subjective:  HD7  Patient evaluated at bedside this AM. States that he is feeling well this morning. Does not have any current complaints, states he is comfortable.  Objective:  Vital signs in last 24 hours: Vitals:   04/10/21 0300 04/10/21 1346 04/10/21 1920 04/11/21 0300  BP:  (!) 104/59 101/61   Pulse:  86 (!) 105   Resp:  (!) 24 20   Temp:  98.4 F (36.9 C) 98.4 F (36.9 C)   TempSrc:  Oral Oral   SpO2:  (!) 89% 97%   Weight: 89.2 kg   88.3 kg  Height:       General: Laying in bed in no acute distress Pulm: Normal work of breathing on room air. Skin: Warm, dry. Neuro: Awake, alert, conversing appropriately.  Assessment/Plan:  [name] is [phone] person living with tachy-brady syndrome s/p PPM, chronic combined systolic and diastolic heart failure, CAD s/p CABG, previous CVA, HTN, HLD, atrial fibrillation on anticoagulation, type 2 diabetes mellitus who was admitted on 8/28 for acute on chronic heart failure, now transitioned to comfort care given his persistent hypotension without advanced heart failure therapies available.  Principal Problem:   End of life care Active Problems:   DM (diabetes mellitus), type 2, uncontrolled ([place])   PAF (paroxysmal atrial fibrillation) (HCC)   COPD (chronic obstructive pulmonary disease) (HCC)   CAD (coronary artery disease)   Right lower lobe pneumonia   S/P placement of cardiac pacemaker   Acute on chronic HFrEF (heart failure with reduced ejection fraction) ([place])  #End of life care Patient appears in great spirits this morning. He was more conversational than he has been in a few days. Will continue to focus on comfort measures. No further lab sticks. Per palliative, he has been accepted to [facility]. We are now pending an available bed. - Dilaudid '1mg'$  q2h PRN for pain - Haldol 0.'5mg'$  q4h PRN for agitation - Glycopyrrolate '1mg'$  q4h PRN for excessive secretions - Ativan '1mg'$  q2h PRN for anxiety - Unrestricted visitation -  No IV sticks  Prior to Admission Living Arrangement: Home Anticipated Discharge Location: Hospice Barriers to Discharge: Hospice placement Dispo: Anticipated discharge in approximately 1-2 day(s).   Sanjuan Dame, MD 04/11/2021, 6:54 AM Pager: [phone] After 5pm on weekdays and 1pm on weekends: On Call pager [phone]

## 2021-04-11 NOTE — Progress Notes (Signed)
[facility] ([facility])  There is not a bed at [facility] today.  Should availability change, [facility] will update hospital and family.  Thank you, Venia Carbon RN, BSN, [facility] Liaison

## 2021-04-11 NOTE — Progress Notes (Signed)
Palliative Medicine Inpatient Follow Up Note  Consulting Provider: Merrily Brittle, DO   Reason for consult:   Palliative Care Consult Services Palliative Medicine Consult    Symptom Management Consult  Reason for Consult? End of life care please      HPI:  Per intake H&P --> [name] is a 65 y.o. 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   Palliative care was asked to get involved to aid in additional symptom management in the setting of end-of-life care.  We have also been asked to help discuss the transition from the hospital to hospice.  Today's Discussion (04/11/2021):  *Please note that this is a verbal dictation therefore any spelling or grammatical errors are due to the "[employer]" system interpretation.  Chart reviewed. Required x1 dose of dilaudid $RemoveBef'1mg'YoBTMuAFUR$  yesterday.   I met with Louie Casa at bedside. He denies pain or nausea. He shares with me that he would like ice for his Pepsi which was provided. He reaffirms his goals of being comfortable. Sayer ideally wants to go home but realizes his sister may not be able to care for his complex needs. Reviewed the idea of a hospice home where he would continue ongoing care and symptom management.  We sat together and watched some of the morning news together. Billal is in good spirits this morning and looking forward to breakfast.   I called patients sister, [name] this morning. I shared with her the acceptance to [facility]. I expressed to her after secure chat with the liaison that likely there will not be  a bed at [facility] today though we will continue  comfort oriented care here in the hospital until there is one.   Questions and concerns addressed   Objective Assessment: Vital Signs Vitals:   04/10/21 1346 04/10/21 1920  BP: (!) 104/59 101/61  Pulse: 86 (!) 105  Resp: (!) 24 20  Temp: 98.4 F (36.9 C) 98.4 F (36.9 C)  SpO2: (!) 89% 97%    Intake/Output Summary (Last 24 hours) at 04/11/2021 0818 Last data filed at 04/11/2021 1497 Gross per 24 hour  Intake 678 ml  Output 700 ml  Net -22 ml   Last Weight  Most recent update: 04/11/2021  3:42 AM    Weight  88.3 kg (194 lb 10.7 oz)            Gen: Older obese M in NAD HEENT: moist mucous membranes CV: Regular rate and irregular rhythm PULM: On 3LPM [place] ABD: soft/nontender  EXT: Generalized edema Neuro: Alert and oriented x2-3  SUMMARY OF RECOMMENDATIONS   DNAR/DNI   Comfort Focused Care   Comfort medications per [facility]    Unrestricted visitation    Plan for transition to [facility] when a bed is available   Ongoing PMT support  Time Spent: 35 Greater than 50% of the time was spent in counseling and coordination of care ______________________________________________________________________________________ [facility] Team Team Cell Phone: [phone] Please utilize secure chat with additional questions, if there is no response within 30 minutes please call the above phone number  Palliative Medicine Team providers are available by phone from 7am to  7pm daily and can be reached through the team cell phone.  Should this patient require assistance outside of these hours, please call the patient's attending physician.

## 2021-04-12 DIAGNOSIS — Z515 Encounter for palliative care: Secondary | ICD-10-CM | POA: Diagnosis not present

## 2021-04-12 NOTE — Progress Notes (Signed)
Manufacturing engineer [facility]) Hospital Liaison note.    [facility] is unable to offer a room today. Hospital Liaison will follow up tomorrow or sooner if a room becomes available. Eligibility is confirmed.   Please do not hesitate to call with questions.    Thank you,   Farrel Gordon, RN, [facility] Liaison   [phone]

## 2021-04-12 NOTE — Care Management Important Message (Signed)
Important Message  Patient Details  Name: JOVEL BUCKALEW MRN: [id number] Date of Birth: [birth year]   Medicare Important Message Given:  Yes     Shelda Altes 04/12/2021, 8:51 AM

## 2021-04-12 NOTE — Progress Notes (Addendum)
   Subjective:  [name]  Patient evaluated at bedside this AM with attending Dr. Evette Doffing.  Patient was a bit drowsy, although alert.  He denied pain and reported that he was comfortable. Does not have any current complaints.  Objective:  Vital signs in last 24 hours: Vitals:   04/11/21 0300 04/11/21 1225 04/11/21 2100 04/12/21 0555  BP:  119/76 (!) 98/58 (!) 105/49  Pulse:  (!) 56 68 (!) 58  Resp:  (!) 22 17   Temp:  97.8 F (36.6 C) 98.5 F (36.9 C) 98.9 F (37.2 C)  TempSrc:  Oral Oral Oral  SpO2:  99% 95% (!) 72%  Weight: 88.3 kg     Height:       Physical Exam Vitals and nursing note reviewed.  Constitutional:      Appearance: He is ill-appearing.     Comments: Pale, however brighter affect  HENT:     Head: Normocephalic.  Pulmonary:     Effort: Pulmonary effort is normal.     Comments: Unlabored breathing Skin:    General: Skin is dry.     Findings: Bruising (diffused) present.  Neurological:     Mental Status: He is alert and oriented to person, place, and time.  Psychiatric:        Mood and Affect: Mood normal.        Behavior: Behavior normal.        Thought Content: Thought content normal.        Judgment: Judgment normal.     Assessment/Plan:  [name] is [phone] person living with tachy-brady syndrome s/p PPM, chronic combined systolic and diastolic heart failure, CAD s/p CABG, previous CVA, HTN, HLD, atrial fibrillation on anticoagulation, type 2 diabetes mellitus who was admitted on 8/28 for acute on chronic heart failure, now transitioned to comfort care given his persistent hypotension without advanced heart failure therapies available.  Principal Problem:   End of life care Active Problems:   DM (diabetes mellitus), type 2, uncontrolled ([place])   PAF (paroxysmal atrial fibrillation) (HCC)   COPD (chronic obstructive pulmonary disease) (HCC)   CAD (coronary artery disease)   Right lower lobe pneumonia   S/P placement of cardiac pacemaker   Acute  on chronic HFrEF (heart failure with reduced ejection fraction) ([place])  #End of life care Patient's mood and affect seems to have improved since last encounter.  He reported that he is comfortable, enjoying his food, and that he has no other complaints. Will continue to focus on comfort measures. No further lab sticks. Per palliative, he has been accepted to [facility]. We are now pending an available bed. - Dilaudid '1mg'$  q2h PRN for pain - Haldol 0.'5mg'$  q4h PRN for agitation - Glycopyrrolate '1mg'$  q4h PRN for excessive secretions - Ativan '1mg'$  q2h PRN for anxiety - Unrestricted visitation - No IV sticks  Prior to Admission Living Arrangement: Home Anticipated Discharge Location: Hospice ([place]) Barriers to Discharge: Hospice placement Dispo: Anticipated discharge in approximately 1-2 day(s).   Joseph Brittle, DO 04/12/2021, 11:16 AM Pager: [phone]. [phone] After 5pm on weekdays and 1pm on weekends: On Call pager [phone]

## 2021-04-13 DIAGNOSIS — Z515 Encounter for palliative care: Secondary | ICD-10-CM | POA: Diagnosis not present

## 2021-04-13 MED ORDER — ONDANSETRON HCL 4 MG PO TABS: 4.0000 mg | ORAL_TABLET | Freq: Four times a day (QID) | ORAL | 0 refills | Status: AC | PRN

## 2021-04-13 MED ORDER — HALOPERIDOL LACTATE 2 MG/ML PO CONC: 0.6000 mg | ORAL | 0 refills | Status: AC | PRN

## 2021-04-13 MED ORDER — ACETAMINOPHEN 325 MG PO TABS: 650.0000 mg | ORAL_TABLET | Freq: Four times a day (QID) | ORAL | 0 refills | Status: AC | PRN

## 2021-04-13 MED ORDER — LORAZEPAM 1 MG PO TABS: 1.0000 mg | ORAL_TABLET | ORAL | 0 refills | Status: AC | PRN

## 2021-04-13 MED ORDER — GLYCOPYRROLATE 1 MG PO TABS: 1.0000 mg | ORAL_TABLET | ORAL | 0 refills | Status: AC | PRN

## 2021-04-13 MED ORDER — POLYVINYL ALCOHOL 1.4 % OP SOLN: 1.0000 [drp] | Freq: Four times a day (QID) | OPHTHALMIC | 0 refills | Status: AC | PRN

## 2021-04-13 MED ORDER — HYDROMORPHONE HCL 1 MG/ML IJ SOLN: 1.0000 mg | INTRAMUSCULAR | 0 refills | Status: AC | PRN

## 2021-04-13 MED ORDER — BIOTENE DRY MOUTH MT LIQD: 15.0000 mL | OROMUCOSAL | 0 refills | Status: AC | PRN

## 2021-04-13 MED ORDER — CHLORHEXIDINE GLUCONATE CLOTH 2 % EX PADS: 2.0000 | MEDICATED_PAD | Freq: Every day | CUTANEOUS | 0 refills | Status: AC

## 2021-04-13 MED ORDER — NITROGLYCERIN 0.4 MG SL SUBL: 0.4000 mg | SUBLINGUAL_TABLET | SUBLINGUAL | 0 refills | Status: AC | PRN

## 2021-04-13 MED ORDER — HALOPERIDOL 0.5 MG PO TABS: 0.5000 mg | ORAL_TABLET | ORAL | Status: AC | PRN

## 2021-04-13 NOTE — Progress Notes (Deleted)
Cardiology Office Note:    Date:  04/13/2021   ID:  [name], DOB Dec 25, 1955, MRN [id number]  PCP:  Joseph Adie, MD   [facility] Providers Cardiologist:  Joseph Moores, MD { Click to update primary MD,subspecialty MD or APP then REFRESH:1}  ***  Referring MD: Joseph Adie, MD   Chief Complaint:  No chief complaint on file.    Patient Profile:    [name] is a 65 y.o. 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 Left ventricular ejection fraction, by estimation, is 20%. The left  ventricle demonstrates global hypokinesis. The left ventricular internal  cavity size was mildly dilated. Left ventricular diastolic parameters are  indeterminate.   2. Right ventricular systolic function is moderately reduced. The right  ventricular size is mildly enlarged. Tricuspid regurgitation signal is  inadequate for assessing PA pressure.   3. Left atrial size was mildly dilated.   4. Doppler not assessed. No evidence of mitral stenosis. Moderate mitral  annular calcification.   5. Doppler not assessed. The aortic valve is tricuspid.   6. The inferior vena cava is normal in size with <50% respiratory  variability, suggesting right atrial pressure of 8 mmHg.   7. Limited echo. The patient was tachycardic in atrial fibrillation, rate  in 130s generally.  VAS US CAROTID DUPLEX BILATERAL 09/23/2020 Summary: Right Carotid: Velocities in the right ICA are consistent with a  60-79% stenosis. Calcific plaque may obscure higher velocity. Left Carotid: Velocities in the left ICA are consistent with a 40-59% stenosis.  Myoview 03/07/16 Nuclear stress EF: 55%. There was no ST segment deviation noted during stress. This is a low risk study. The left ventricular ejection fraction is normal (55-65%). Findings consistent with prior myocardial infarction. 1. Fixed medium-sized, moderate-intensity basal to mid inferior perfusion defect.  Fixed small, moderate-intensity apical anterior perfusion defect.  No ischemia.  The inferior defect in particular appears fairly extensive, so hard to label it as artifact, but wall motion appears normal throughout.  Cannot rule out prior infarction.  2. Normal LV systolic function and wall motion.  3. Low risk study given lack of ischemia.    Echo 02/15/16 Mild LVH, EF 45-50%, diffuse HK, mild LAE, trivial pericardial effusion   Echo 11/12/15 EF 55-60%, normal wall motion, grade 2 diastolic dysfunction, MAC, mild mitral stenosis, mild LAE, PASP 55 mmHg, moderate pulmonary hypertension   LHC 04/30/15 LAD: Ostial and mid 95%, distal 40%, ostial D1 100%, ostial D2 85% RI: Ostial 65% LCx: Proximal to mid 70%, mid 65%, OM2 90% RCA: Ostial 80%, proximal to distal 100%, distal 100%, RPDA 85%, RPAVB 100% EF 35-45%   Myoview 04/23/15 Large, mostly reversible inferior, apical and inferolateral reversible perfusion defect suggestive of ischemia (SDS 10, Extent 37%). This study was not-gated due to atrial fibrillation. This is a high-risk study based on the size and severity of the perfusion defect.   Carotid US 8/16 R 60-79% L 80-99%   Echo 12/18/14 Mild  LVH, EF 55%, wall motion abnormalities could not be excluded, MAC, trivial MR, mild LAE   Echo 10/16/12 Mild LVH, vigorous LVF, EF 65-70%, normal wall motion, grade 1 diastolic dysfunction   Nuclear study 09/06/12 Myoview scan with small apical defect Seen in horizontal images only.  Cannot  exclude tiny region of apical ischemia vs shift soft tissue.  Otherwise normal perfusion.  Overall low risk scan.  LV Ejection Fraction: 69%.  LV Wall Motion:  NL LV Function; NL Wall Motion      History of Present Illness: [name] ***        Past Medical History:  Diagnosis Date   Abdominal hernia    Chronic, not a good surgical candidate   Abscess, abdomen 12/31/2010   Referred to Wound Care in 01/2011 because of multiple abd abscess with VERY large ventral hernia (please look at image of CT abd/pelvis 09/2010).  Because of hernia I was hesitant to I&D.       Anemia    History of Iron Def Anemia   Anxiety    AVM (arteriovenous malformation)    chronic GI blood loss   Carotid artery disease (HCC)    s/p CEA   Chronic diastolic CHF (congestive heart failure) (HCC)    takes Lasix   Chronic low back pain    COPD (chronic obstructive pulmonary disease) (HCC)    CVA (cerebral infarction) 09/2010   Bilateral with Left > Right   Diabetes mellitus    GERD (gastroesophageal reflux disease)    History of nuclear stress test    Myoview 9/16:  Inferior, apical and inf-lateral ischemia; not gated; High Risk   Hx of echocardiogram    Echo 5/16:  Mild LVH, EF 55%, indeterm. diast function, WMA could not be ruled out, MAC, trivial MR, mild LAE, normal RVF //  b. Echo 4/17: EF 55-60%, no RWMA, Gr 2 DD, Ao sclerosis, MAC, mild MS, mild LAE, PASP 55 mHg   Hyperlipidemia    Hypertension    Intellectual disability    Sister helps to take care of him and takes him to appts   Itching    all over body; pt scratches and has sores on bilateral arms and abdomen   Lung nodule    Myocardial infarction [facility]) 2016 ?   Heart attack  (  Per  pt. )   Obesity    Oxygen dependent    wears 2 liters at bedtime and when needed   PAF (paroxysmal atrial fibrillation) ([place]) 06/2009   CHADS score 2 (HTN, DM) // Pradaxa for Afib // Pradaxa stopped due to worsening anemia (Hgb in 5/18 8.5 >> Pradaxa remains on  hold)    Pneumonia    hx of   Stroke ([place])    Tobacco user    Smokes 1ppd for multiple years.  Quit after hosp 09/2010.   Tubular adenoma of colon    Wears glasses     Current Medications: No outpatient medications have been marked as taking for the 04/14/21 encounter (Appointment) with Richardson Dopp T, PA-C.     Allergies:   Penicillins   Social History   Tobacco Use   Smoking status: Former    Packs/day: 1.00    Years: 42.00    Pack years: 42.00    Types: Cigarettes, Pipe    Quit date: 11/10/2010    Years since quitting: 10.4   Smokeless tobacco: Former    Quit date: 10/02/2010  Vaping Use   Vaping Use:  Never used  Substance Use Topics   Alcohol use: No    Alcohol/week: 0.0 standard drinks    Comment: " last drink was 2003" 08/11/15   Drug use: No     Family Hx: The patient's family history includes Breast cancer in his maternal grandmother; Heart attack in his maternal grandmother; Heart disease in his brother, father, maternal grandmother, and mother; Hypertension in his sister; Peripheral vascular disease in his father; Stomach cancer in his maternal uncle. There is no history of Colon cancer or Stroke.  ROS   EKGs/Labs/Other Test Reviewed:    EKG:  EKG is *** ordered today.  The ekg ordered today demonstrates ***  Recent Labs: 03/24/2021: TSH 1.854 04/04/2021: ALT 30; B Natriuretic Peptide 731.8 04/08/2021: Magnesium 2.1 04/09/2021: BUN 93; Creatinine, Ser 1.76; Hemoglobin 8.1; Platelets 150; Potassium 3.8; Sodium 128   Recent Lipid Panel Lab Results  Component Value Date/Time   CHOL 47 03/24/2021 01:55 AM   TRIG 48 03/24/2021 01:55 AM   HDL 23 (L) 03/24/2021 01:55 AM   LDLCALC 14 03/24/2021 01:55 AM   LDLDIRECT 54 08/23/2012 02:47 PM      Risk Assessment/Calculations:   {Does this patient have ATRIAL FIBRILLATION?[phone]}      Physical Exam:    VS:  There were no vitals taken for this visit.    Wt Readings from Last 3 Encounters:  04/11/21 194 lb  10.7 oz (88.3 kg)  04/02/21 210 lb 15.7 oz (95.7 kg)  09/23/20 178 lb (80.7 kg)     Physical Exam ***     ASSESSMENT & PLAN:    {Select for Dx:25819}    {Are you ordering a CV Procedure (e.g. stress test, cath, DCCV, TEE, etc)?   Press F2        :[id number]}    Dispo:  No follow-ups on file.   Medication Adjustments/Labs and Tests Ordered: Current medicines are reviewed at length with the patient today.  Concerns regarding medicines are outlined above.  Tests Ordered: No orders of the defined types were placed in this encounter.  Medication Changes: No orders of the defined types were placed in this encounter.   Signed, Richardson Dopp, PA-C  04/13/2021 5:15 PM    [facility] [place], [place], [place]  [address] Phone: [phone]; Fax: [phone]

## 2021-04-13 NOTE — Progress Notes (Signed)
Manufacturing engineer [facility]) Hospital Liaison note.    Chart reviewed and eligibility confirmed for [facility]. Family agreeable to transfer today. TOC aware.    [facility] will notify TOC when registration paperwork has been completed to arrange transport.   RN please call report to [phone].  Please be sure a signed [place] DNR form transports with this pt.  Thank you for the opportunity to participate in this patient's care.  Domenic Moras, BSN, RN [facility] Liaison (listed on [place] under Hospice/[facility])    [phone] (24h on call)

## 2021-04-13 NOTE — TOC Transition Note (Addendum)
Transition of Care [facility]) - CM/SW Discharge Note   Patient Details  Name: [name] MRN: [id number] Date of Birth: 31-Mar-1956  Transition of Care [facility]) CM/SW Contact:  Trula Ore, [place] Phone Number: 04/13/2021, 2:44 PM   Clinical Narrative:     Patient will DC to: [facility]  Anticipated DC date: 04/13/2021  Family notified: [name]  Transport by: Corey Harold  ?  Per MD patient ready for DC to [facility] . RN, patient, patient's family, Olivia Mackie with [employer] [employer] of the [employer] with [facility] notified of DC. RN given number for report tele# [phone].  DNR signed by MD attached to patients dc packet. [facility] transport requested for patient.  CSW signing off.   Final next level of care: [place] ([place]) Barriers to Discharge: No Barriers Identified   Patient Goals and CMS Choice Patient states their goals for this hospitalization and ongoing recovery are:: [name] sister CMS Medicare.gov Compare Post Acute Care list provided to:: Patient Represenative (must comment) [name] sister) Choice offered to / list presented to : Sibling [name] Sister)  Discharge Placement              Patient chooses bed at:  [facility]) Patient to be transferred to facility by: [place] Name of family member notified: [name] Patient and family notified of of transfer: 04/13/21  Discharge Plan and Services                                     Social Determinants of Health (SDOH) Interventions     Readmission Risk Interventions No flowsheet data found.

## 2021-04-13 NOTE — Progress Notes (Signed)
Pt discharged to [facility] place transported by [employer].

## 2021-04-13 NOTE — Plan of Care (Signed)
  Problem: Education: Goal: Knowledge of General Education information will improve Description: Including pain rating scale, medication(s)/side effects and non-pharmacologic comfort measures Outcome: Progressing   Problem: Health Behavior/Discharge Planning: Goal: Ability to manage health-related needs will improve Outcome: Progressing   Problem: Clinical Measurements: Goal: Ability to maintain clinical measurements within normal limits will improve Outcome: Progressing Goal: Will remain free from infection Outcome: Progressing Goal: Diagnostic test results will improve Outcome: Progressing Goal: Respiratory complications will improve Outcome: Progressing Goal: Cardiovascular complication will be avoided Outcome: Progressing   Problem: Activity: Goal: Risk for activity intolerance will decrease Outcome: Progressing   Problem: Nutrition: Goal: Adequate nutrition will be maintained Outcome: Progressing   Problem: Coping: Goal: Level of anxiety will decrease Outcome: Progressing   Problem: Elimination: Goal: Will not experience complications related to bowel motility Outcome: Progressing Goal: Will not experience complications related to urinary retention Outcome: Progressing   Problem: Pain Managment: Goal: General experience of comfort will improve Outcome: Progressing   Problem: Safety: Goal: Ability to remain free from injury will improve Outcome: Progressing   Problem: Skin Integrity: Goal: Risk for impaired skin integrity will decrease Outcome: Progressing   Problem: Education: Goal: Knowledge of the prescribed therapeutic regimen will improve Outcome: Progressing   Problem: Coping: Goal: Ability to identify and develop effective coping behavior will improve Outcome: Progressing   Problem: Clinical Measurements: Goal: Quality of life will improve Outcome: Progressing   Problem: Respiratory: Goal: Verbalizations of increased ease of respirations will  increase Outcome: Progressing   Problem: Role Relationship: Goal: Family's ability to cope with current situation will improve Outcome: Progressing Goal: Ability to verbalize concerns, feelings, and thoughts to partner or family member will improve Outcome: Progressing   Problem: Pain Management: Goal: Satisfaction with pain management regimen will improve Outcome: Progressing   

## 2021-04-13 NOTE — Discharge Summary (Signed)
Name: [name] MRN: [id number] DOB: September 11, 1955 65 y.o. PCP: Janifer Adie, MD  Date of Admission: 04/04/2021  7:45 PM Date of Discharge: 04/13/2021 Attending Physician: Axel Filler, *  Discharge Diagnosis: End of life care DM (diabetes mellitus), type 2, uncontrolled PAF (paroxysmal atrial fibrillation) COPD (chronic obstructive pulmonary disease) CAD (coronary artery disease) Right lower lobe pneumonia S/P placement of cardiac pacemaker Acute on chronic HFrEF (heart failure with reduced ejection fraction) Urinary retention with indwelling foley catheter  Discharge Medications: Allergies as of 04/13/2021       Reactions   Penicillins Hives, Nausea And Vomiting, Swelling, Other (See Comments)   Tolerated Cefepime Has patient had a PCN reaction causing immediate rash, facial/tongue/throat swelling, SOB or lightheadedness with hypotension: YES Has patient had a PCN reaction causing severe rash involving mucus membranes or skin necrosis: No Has patient had a PCN reaction that required hospitalization No Has patient had a PCN reaction occurring within the last 10 years: No If all of the above answers are "NO", then may proceed with Cephalosporin use.        Medication List     STOP taking these medications    apixaban 5 MG Tabs tablet Commonly known as: ELIQUIS   aspirin 81 MG EC tablet   atorvastatin 80 MG tablet Commonly known as: LIPITOR   carvedilol 12.5 MG tablet Commonly known as: COREG   Darbepoetin Alfa 150 MCG/0.3ML Sosy injection Commonly known as: ARANESP   furosemide 80 MG tablet Commonly known as: LASIX   insulin glargine 100 UNIT/ML injection Commonly known as: LANTUS   losartan 25 MG tablet Commonly known as: COZAAR   metFORMIN 500 MG tablet Commonly known as: GLUCOPHAGE   multivitamin capsule   multivitamin Tabs tablet   nystatin powder Commonly known as: MYCOSTATIN/NYSTOP   PARoxetine 40 MG tablet Commonly  known as: PAXIL   tamsulosin 0.4 MG Caps capsule Commonly known as: FLOMAX   tiotropium 18 MCG inhalation capsule Commonly known as: SPIRIVA   Vitamin D3 50 MCG (2000 UT) Tabs       TAKE these medications    acetaminophen 325 MG tablet Commonly known as: TYLENOL Take 2 tablets (650 mg total) by mouth every 6 (six) hours as needed for mild pain (or Fever >/= 101). What changed:  when to take this reasons to take this   antiseptic oral rinse Liqd Apply 15 mLs topically as needed for dry mouth.   Chlorhexidine Gluconate Cloth 2 % Pads Apply 2 each topically daily. Start taking on: April 14, 2021   glycopyrrolate 1 MG tablet Commonly known as: ROBINUL Take 1 tablet (1 mg total) by mouth every 4 (four) hours as needed (excessive secretions).   guaiFENesin 100 MG/5ML Soln Commonly known as: ROBITUSSIN Take 5 mLs by mouth every 4 (four) hours as needed for cough or to loosen phlegm.   haloperidol 0.5 MG tablet Commonly known as: HALDOL Take 1 tablet (0.5 mg total) by mouth every 4 (four) hours as needed for agitation (or delirium).   haloperidol 2 MG/ML solution Commonly known as: HALDOL Place 0.3 mLs (0.6 mg total) under the tongue every 4 (four) hours as needed for agitation (or delirium).   HYDROmorphone 1 MG/ML injection Commonly known as: DILAUDID Inject 1 mL (1 mg total) into the vein every 2 (two) hours as needed for severe pain (or dyspnea).   LORazepam 1 MG tablet Commonly known as: ATIVAN Take 1 tablet (1 mg total) by mouth every 2 (two) hours  as needed for anxiety.   nitroGLYCERIN 0.4 MG SL tablet Commonly known as: NITROSTAT Place 1 tablet (0.4 mg total) under the tongue every 5 (five) minutes as needed for chest pain.   ondansetron 4 MG tablet Commonly known as: ZOFRAN Take 1 tablet (4 mg total) by mouth every 6 (six) hours as needed for nausea.   OXYGEN Inhale 2.5 L into the lungs See admin instructions. As needed and at bedtime for shortness of  breath   pantoprazole 40 MG tablet Commonly known as: PROTONIX Take 40 mg by mouth daily.   polyvinyl alcohol 1.4 % ophthalmic solution Commonly known as: LIQUIFILM TEARS Place 1 drop into both eyes 4 (four) times daily as needed for dry eyes. What changed:  when to take this additional instructions   senna-docusate 8.6-50 MG tablet Commonly known as: Senokot-S Take 1 tablet by mouth at bedtime as needed for mild constipation.               Discharge Care Instructions  (From admission, onward)           Start     Ordered   04/13/21 0000  Leave dressing on - Keep it clean, dry, and intact until clinic visit        04/13/21 1048            Disposition and follow-up:   JosephAntino L [name] was discharged from [facility] to [facility] (hospice) and will be on comfort care.    Hospital Course by problem list: [name], 65 year old male patient with past medical history significant for chronic combined congestive heart failure, coronary artery disease, COPD, type 2 diabetes mellitus, paroxysmal atrial fibrillation, hypertension, tachy-brady syndrome.  Patient was discharged from the hospital 2 days prior to admission for COVID-19 and bradycardia to asystole with cardiac resuscitation and PPM implantation 03/26/2021.   He presented from [facility] to the ED with chief complaint of nausea, vomiting, weakness 2/2 starting metformin which was then discontinued. He was admitted again on 04/04/2021 for acute on chronic combined congestive heart failure exacerbation. Per MAR from the facility, there was no administration of oral lasix.  In the ED, patient was hypotensive and left shift leukocytosis was present. There was initial concern for sepsis because of concerning findings of pneumonia on imaging. However, patient was afebrile and remained so through hospitalization and WBC trended down without antibiotic administration (WBC 18.3 to 12.6).  Workup was inconsistent with sepsis, but due to persistent hypotension, Vancomycin (MRSA positive), cefepime, and flagyl was started to cover possible resistant pathogens and anaerobes. Antihypertensives and diuretics were held with admission, then restarted later during hospitalization to address hypervolemic status, however patient became somnolent.  Workup initiated to rule out adrenal insufficiency. There was also concern GI bleed, hemolysis, sequestration due to down trending hemoglobin with diffuse cutaneous senile purpura with recent apixaban use. Due to poor prognosis and difficulty with maintaining adequate blood pressure and euvolemic state, palliative care was consulted, and a discussion with patient and family ultimately led to the decision of inpatient hospice and patient was transitioned to comfort care.  Discharge Exam:   BP 96/66 (BP Location: Right Arm)   Pulse 79   Temp 98.6 F (37 C) (Oral)   Resp (!) 22   Ht 5' (1.524 m)   Wt 88.3 kg   SpO2 99%   BMI 38.02 kg/m  Discharge exam:  Physical Exam Vitals and nursing note reviewed.  Constitutional:  General: He is not in acute distress.    Appearance: He is ill-appearing.  HENT:     Head: Normocephalic.  Cardiovascular:     Rate and Rhythm: Normal rate.  Pulmonary:     Comments: Mildly labored breathing Neurological:     Mental Status: He is alert and oriented to person, place, and time.   Pertinent Labs, Studies, and Procedures:  None  Discharge Instructions: Discharge Instructions     Diet general   Complete by: As directed    Increase activity slowly   Complete by: As directed    Leave dressing on - Keep it clean, dry, and intact until clinic visit   Complete by: As directed        Signed: Merrily Brittle, DO 04/13/2021, 1:56 PM   Pager: [phone]

## 2021-04-14 ENCOUNTER — Ambulatory Visit: Payer: Medicare (Managed Care) | Admitting: Physician Assistant

## 2021-05-06 ENCOUNTER — Encounter (HOSPITAL_COMMUNITY): Payer: Medicare (Managed Care)

## 2021-05-06 ENCOUNTER — Ambulatory Visit: Payer: Medicare (Managed Care)

## 2021-05-08 DEATH — deceased

## 2021-06-09 ENCOUNTER — Ambulatory Visit: Payer: Medicare (Managed Care) | Admitting: Cardiovascular Disease

## 2021-07-06 ENCOUNTER — Encounter: Payer: Medicare (Managed Care) | Admitting: Internal Medicine
